# Patient Record
Sex: Male | Born: 1940 | Race: White | Hispanic: No | Marital: Married | Smoking: Former smoker
Health system: Southern US, Community
[De-identification: ages and names within clinical notes are randomized; demographics above are authoritative.]

## PROBLEM LIST (undated history)

## (undated) DIAGNOSIS — I2510 Atherosclerotic heart disease of native coronary artery without angina pectoris: Secondary | ICD-10-CM

## (undated) DIAGNOSIS — K922 Gastrointestinal hemorrhage, unspecified: Secondary | ICD-10-CM

## (undated) DIAGNOSIS — E785 Hyperlipidemia, unspecified: Secondary | ICD-10-CM

## (undated) DIAGNOSIS — K5790 Diverticulosis of intestine, part unspecified, without perforation or abscess without bleeding: Secondary | ICD-10-CM

## (undated) DIAGNOSIS — I10 Essential (primary) hypertension: Secondary | ICD-10-CM

## (undated) DIAGNOSIS — D126 Benign neoplasm of colon, unspecified: Secondary | ICD-10-CM

## (undated) DIAGNOSIS — I480 Paroxysmal atrial fibrillation: Secondary | ICD-10-CM

## (undated) DIAGNOSIS — E119 Type 2 diabetes mellitus without complications: Secondary | ICD-10-CM

## (undated) DIAGNOSIS — Z950 Presence of cardiac pacemaker: Secondary | ICD-10-CM

## (undated) DIAGNOSIS — I5032 Chronic diastolic (congestive) heart failure: Secondary | ICD-10-CM

## (undated) DIAGNOSIS — K219 Gastro-esophageal reflux disease without esophagitis: Secondary | ICD-10-CM

## (undated) DIAGNOSIS — N183 Chronic kidney disease, stage 3 unspecified: Secondary | ICD-10-CM

## (undated) DIAGNOSIS — I639 Cerebral infarction, unspecified: Secondary | ICD-10-CM

## (undated) DIAGNOSIS — K2970 Gastritis, unspecified, without bleeding: Secondary | ICD-10-CM

## (undated) DIAGNOSIS — I6529 Occlusion and stenosis of unspecified carotid artery: Secondary | ICD-10-CM

## (undated) DIAGNOSIS — G709 Myoneural disorder, unspecified: Secondary | ICD-10-CM

## (undated) DIAGNOSIS — D649 Anemia, unspecified: Secondary | ICD-10-CM

## (undated) DIAGNOSIS — I739 Peripheral vascular disease, unspecified: Secondary | ICD-10-CM

## (undated) DIAGNOSIS — E1140 Type 2 diabetes mellitus with diabetic neuropathy, unspecified: Secondary | ICD-10-CM

## (undated) DIAGNOSIS — R0602 Shortness of breath: Secondary | ICD-10-CM

## (undated) HISTORY — DX: Hyperlipidemia, unspecified: E78.5

## (undated) HISTORY — DX: Occlusion and stenosis of unspecified carotid artery: I65.29

## (undated) HISTORY — DX: Gastrointestinal hemorrhage, unspecified: K92.2

## (undated) HISTORY — PX: KNEE ARTHROSCOPY: SHX127

## (undated) HISTORY — DX: Peripheral vascular disease, unspecified: I73.9

## (undated) HISTORY — DX: Chronic kidney disease, stage 3 unspecified: N18.30

## (undated) HISTORY — PX: JOINT REPLACEMENT: SHX530

## (undated) HISTORY — DX: Chronic diastolic (congestive) heart failure: I50.32

## (undated) HISTORY — DX: Essential (primary) hypertension: I10

## (undated) HISTORY — DX: Chronic kidney disease, stage 3 (moderate): N18.3

## (undated) HISTORY — DX: Atherosclerotic heart disease of native coronary artery without angina pectoris: I25.10

## (undated) HISTORY — PX: CATARACT EXTRACTION: SUR2

## (undated) HISTORY — PX: TONSILLECTOMY AND ADENOIDECTOMY: SUR1326

## (undated) HISTORY — DX: Paroxysmal atrial fibrillation: I48.0

## (undated) HISTORY — DX: Diverticulosis of intestine, part unspecified, without perforation or abscess without bleeding: K57.90

## (undated) HISTORY — PX: CHOLECYSTECTOMY: SHX55

## (undated) HISTORY — PX: CIRCUMCISION: SUR203

## (undated) HISTORY — DX: Type 2 diabetes mellitus without complications: E11.9

## (undated) HISTORY — DX: Benign neoplasm of colon, unspecified: D12.6

## (undated) HISTORY — PX: TOTAL KNEE ARTHROPLASTY: SHX125

## (undated) SURGERY — EGD (ESOPHAGOGASTRODUODENOSCOPY)
Anesthesia: Moderate Sedation

## (undated) SURGERY — ESOPHAGOGASTRODUODENOSCOPY (EGD) WITH PROPOFOL
Anesthesia: Monitor Anesthesia Care

---

## 1998-09-29 ENCOUNTER — Inpatient Hospital Stay (HOSPITAL_COMMUNITY): Admission: EM | Admit: 1998-09-29 | Discharge: 1998-10-03 | Payer: Self-pay | Admitting: *Deleted

## 1998-09-29 ENCOUNTER — Encounter: Payer: Self-pay | Admitting: Cardiology

## 2000-06-17 ENCOUNTER — Ambulatory Visit (HOSPITAL_COMMUNITY): Admission: RE | Admit: 2000-06-17 | Discharge: 2000-06-17 | Payer: Self-pay | Admitting: *Deleted

## 2001-03-11 ENCOUNTER — Ambulatory Visit (HOSPITAL_COMMUNITY): Admission: RE | Admit: 2001-03-11 | Discharge: 2001-03-11 | Payer: Self-pay | Admitting: Neurology

## 2002-01-15 DIAGNOSIS — I10 Essential (primary) hypertension: Secondary | ICD-10-CM | POA: Insufficient documentation

## 2002-07-04 ENCOUNTER — Inpatient Hospital Stay (HOSPITAL_COMMUNITY): Admission: EM | Admit: 2002-07-04 | Discharge: 2002-07-05 | Payer: Self-pay | Admitting: Emergency Medicine

## 2002-07-04 ENCOUNTER — Encounter: Payer: Self-pay | Admitting: Cardiology

## 2004-01-31 ENCOUNTER — Ambulatory Visit: Payer: Self-pay | Admitting: Endocrinology

## 2004-02-01 ENCOUNTER — Ambulatory Visit: Payer: Self-pay | Admitting: Endocrinology

## 2005-10-15 ENCOUNTER — Inpatient Hospital Stay (HOSPITAL_COMMUNITY): Admission: RE | Admit: 2005-10-15 | Discharge: 2005-10-18 | Payer: Self-pay | Admitting: Orthopedic Surgery

## 2006-05-15 ENCOUNTER — Ambulatory Visit: Payer: Self-pay | Admitting: Vascular Surgery

## 2007-02-25 ENCOUNTER — Ambulatory Visit: Payer: Self-pay | Admitting: Vascular Surgery

## 2007-08-26 ENCOUNTER — Ambulatory Visit: Payer: Self-pay | Admitting: Vascular Surgery

## 2008-03-02 ENCOUNTER — Ambulatory Visit: Payer: Self-pay | Admitting: Vascular Surgery

## 2008-07-06 ENCOUNTER — Ambulatory Visit: Payer: Self-pay | Admitting: Vascular Surgery

## 2008-08-09 ENCOUNTER — Ambulatory Visit: Payer: Self-pay | Admitting: Vascular Surgery

## 2008-08-09 ENCOUNTER — Ambulatory Visit (HOSPITAL_COMMUNITY): Admission: RE | Admit: 2008-08-09 | Discharge: 2008-08-09 | Payer: Self-pay | Admitting: Vascular Surgery

## 2008-08-31 ENCOUNTER — Ambulatory Visit: Payer: Self-pay | Admitting: Vascular Surgery

## 2009-02-07 ENCOUNTER — Inpatient Hospital Stay (HOSPITAL_COMMUNITY): Admission: EM | Admit: 2009-02-07 | Discharge: 2009-02-09 | Payer: Self-pay | Admitting: Emergency Medicine

## 2009-02-24 ENCOUNTER — Ambulatory Visit: Payer: Self-pay | Admitting: Vascular Surgery

## 2009-03-15 HISTORY — PX: CAROTID ENDARTERECTOMY: SUR193

## 2009-03-22 ENCOUNTER — Inpatient Hospital Stay (HOSPITAL_COMMUNITY): Admission: RE | Admit: 2009-03-22 | Discharge: 2009-03-23 | Payer: Self-pay | Admitting: Vascular Surgery

## 2009-03-22 ENCOUNTER — Ambulatory Visit: Payer: Self-pay | Admitting: Vascular Surgery

## 2009-03-22 ENCOUNTER — Encounter: Payer: Self-pay | Admitting: Vascular Surgery

## 2009-03-30 ENCOUNTER — Inpatient Hospital Stay (HOSPITAL_COMMUNITY): Admission: EM | Admit: 2009-03-30 | Discharge: 2009-03-31 | Payer: Self-pay | Admitting: Emergency Medicine

## 2009-03-30 ENCOUNTER — Encounter: Admission: RE | Admit: 2009-03-30 | Discharge: 2009-03-30 | Payer: Self-pay | Admitting: Internal Medicine

## 2009-03-30 ENCOUNTER — Encounter (INDEPENDENT_AMBULATORY_CARE_PROVIDER_SITE_OTHER): Payer: Self-pay | Admitting: Surgery

## 2009-04-07 ENCOUNTER — Ambulatory Visit: Payer: Self-pay | Admitting: Vascular Surgery

## 2009-11-30 ENCOUNTER — Ambulatory Visit: Payer: Self-pay | Admitting: Vascular Surgery

## 2009-11-30 ENCOUNTER — Ambulatory Visit: Payer: Self-pay | Admitting: Cardiovascular Disease

## 2010-01-15 DIAGNOSIS — I779 Disorder of arteries and arterioles, unspecified: Secondary | ICD-10-CM | POA: Insufficient documentation

## 2010-01-15 DIAGNOSIS — D509 Iron deficiency anemia, unspecified: Secondary | ICD-10-CM | POA: Insufficient documentation

## 2010-01-15 DIAGNOSIS — K649 Unspecified hemorrhoids: Secondary | ICD-10-CM | POA: Insufficient documentation

## 2010-03-29 ENCOUNTER — Ambulatory Visit (INDEPENDENT_AMBULATORY_CARE_PROVIDER_SITE_OTHER): Payer: Federal, State, Local not specified - PPO | Admitting: Nurse Practitioner

## 2010-03-29 DIAGNOSIS — E119 Type 2 diabetes mellitus without complications: Secondary | ICD-10-CM

## 2010-03-29 DIAGNOSIS — I4891 Unspecified atrial fibrillation: Secondary | ICD-10-CM

## 2010-03-29 DIAGNOSIS — I739 Peripheral vascular disease, unspecified: Secondary | ICD-10-CM

## 2010-03-30 ENCOUNTER — Encounter: Payer: Self-pay | Admitting: Cardiovascular Disease

## 2010-03-30 DIAGNOSIS — I6529 Occlusion and stenosis of unspecified carotid artery: Secondary | ICD-10-CM | POA: Insufficient documentation

## 2010-03-31 ENCOUNTER — Encounter (INDEPENDENT_AMBULATORY_CARE_PROVIDER_SITE_OTHER): Payer: Federal, State, Local not specified - PPO

## 2010-03-31 ENCOUNTER — Encounter: Payer: Self-pay | Admitting: Cardiovascular Disease

## 2010-03-31 DIAGNOSIS — M79609 Pain in unspecified limb: Secondary | ICD-10-CM | POA: Insufficient documentation

## 2010-03-31 DIAGNOSIS — I70219 Atherosclerosis of native arteries of extremities with intermittent claudication, unspecified extremity: Secondary | ICD-10-CM

## 2010-03-31 DIAGNOSIS — I739 Peripheral vascular disease, unspecified: Secondary | ICD-10-CM

## 2010-04-02 LAB — BASIC METABOLIC PANEL
CO2: 27 mEq/L (ref 19–32)
Chloride: 102 mEq/L (ref 96–112)
GFR calc non Af Amer: >60 mL/min (ref >60)
Glucose, Bld: 193 mg/dL — ABNORMAL HIGH (ref 70–99)
Potassium: 3.8 mEq/L (ref 3.5–5.1)
Sodium: 136 mEq/L (ref 135–145)

## 2010-04-02 LAB — DIFFERENTIAL
Basophils Absolute: 0.1 10*3/uL (ref 0.0–0.1)
Basophils Relative: 1 % (ref 0–1)
Eosinophils Absolute: 0.3 10*3/uL (ref 0.0–0.7)
Neutrophils Relative %: 66 % (ref 43–77)

## 2010-04-02 LAB — CBC
HCT: 27.9 % — ABNORMAL LOW (ref 39.0–52.0)
HCT: 28.3 % — ABNORMAL LOW (ref 39.0–52.0)
HCT: 33.2 % — ABNORMAL LOW (ref 39.0–52.0)
Hemoglobin: 10.8 g/dL — ABNORMAL LOW (ref 13.0–17.0)
Hemoglobin: 11.5 g/dL — ABNORMAL LOW (ref 13.0–17.0)
Hemoglobin: 9.6 g/dL — ABNORMAL LOW (ref 13.0–17.0)
Hemoglobin: 9.7 g/dL — ABNORMAL LOW (ref 13.0–17.0)
MCHC: 34.8 g/dL (ref 30.0–36.0)
Platelets: 217 10*3/uL (ref 150–400)
RBC: 4.28 MIL/uL (ref 4.22–5.81)
RDW: 15 % (ref 11.5–15.5)
RDW: 15.6 % — ABNORMAL HIGH (ref 11.5–15.5)
RDW: 15.8 % — ABNORMAL HIGH (ref 11.5–15.5)

## 2010-04-02 LAB — GLUCOSE, CAPILLARY
Glucose-Capillary: 162 mg/dL — ABNORMAL HIGH (ref 70–99)
Glucose-Capillary: 204 mg/dL — ABNORMAL HIGH (ref 70–99)
Glucose-Capillary: 217 mg/dL — ABNORMAL HIGH (ref 70–99)

## 2010-04-02 LAB — COMPREHENSIVE METABOLIC PANEL
ALT: 34 U/L (ref 0–53)
Alkaline Phosphatase: 44 U/L (ref 39–117)
BUN: 18 mg/dL (ref 6–23)
CO2: 24 mEq/L (ref 19–32)
GFR calc non Af Amer: >60 mL/min (ref >60)
Glucose, Bld: 147 mg/dL — ABNORMAL HIGH (ref 70–99)
Potassium: 4 mEq/L (ref 3.5–5.1)
Sodium: 136 mEq/L (ref 135–145)
Total Bilirubin: 0.6 mg/dL (ref 0.3–1.2)

## 2010-04-02 LAB — TYPE AND SCREEN
ABO/RH(D): O NEG
Antibody Screen: NEGATIVE

## 2010-04-04 NOTE — Miscellaneous (Signed)
Summary: Orders Update  Clinical Lists Changes  Problems: Added new problem of PAIN IN SOFT TISSUES OF LIMB (ICD-729.5) Orders: Added new Test order of Arterial Duplex Lower Extremity (Arterial Duplex Low) - Signed

## 2010-04-04 NOTE — Miscellaneous (Signed)
Summary: Orders Update  Clinical Lists Changes  Problems: Added new problem of CAROTID ARTERY DISEASE (ICD-433.10) Orders: Added new Test order of Carotid Duplex (Carotid Duplex) - Signed

## 2010-04-09 LAB — CBC
HCT: 29.8 % — ABNORMAL LOW (ref 39.0–52.0)
HCT: 31.7 % — ABNORMAL LOW (ref 39.0–52.0)
Hemoglobin: 10.1 g/dL — ABNORMAL LOW (ref 13.0–17.0)
MCHC: 33.9 g/dL (ref 30.0–36.0)
MCV: 76.6 fL — ABNORMAL LOW (ref 78.0–100.0)
Platelets: 238 10*3/uL (ref 150–400)
Platelets: 278 10*3/uL (ref 150–400)
RBC: 3.82 MIL/uL — ABNORMAL LOW (ref 4.22–5.81)
RBC: 4.46 MIL/uL (ref 4.22–5.81)
RBC: 4.68 MIL/uL (ref 4.22–5.81)
WBC: 8.8 10*3/uL (ref 4.0–10.5)
WBC: 15.3 10*3/uL — ABNORMAL HIGH (ref 4.0–10.5)
WBC: 21.7 10*3/uL — ABNORMAL HIGH (ref 4.0–10.5)

## 2010-04-09 LAB — COMPREHENSIVE METABOLIC PANEL
ALT: 28 U/L (ref 0–53)
ALT: 15 U/L (ref 0–53)
AST: 30 U/L (ref 0–37)
AST: 15 U/L (ref 0–37)
Albumin: 2.7 g/dL — ABNORMAL LOW (ref 3.5–5.2)
Albumin: 3.3 g/dL — ABNORMAL LOW (ref 3.5–5.2)
Alkaline Phosphatase: 59 U/L (ref 39–117)
BUN: 15 mg/dL (ref 6–23)
CO2: 26 mEq/L (ref 19–32)
CO2: 27 mEq/L (ref 19–32)
CO2: 29 mEq/L (ref 19–32)
Calcium: 9.3 mg/dL (ref 8.4–10.5)
Chloride: 101 mEq/L (ref 96–112)
Chloride: 92 mEq/L — ABNORMAL LOW (ref 96–112)
Chloride: 95 mEq/L — ABNORMAL LOW (ref 96–112)
Creatinine, Ser: 1.37 mg/dL (ref 0.4–1.5)
Creatinine, Ser: 1.38 mg/dL (ref 0.4–1.5)
GFR calc Af Amer: >60 mL/min (ref >60)
GFR calc Af Amer: >60 mL/min (ref >60)
GFR calc non Af Amer: >60 mL/min (ref >60)
GFR calc non Af Amer: 52 mL/min — ABNORMAL LOW (ref >60)
Potassium: 3.5 mEq/L (ref 3.5–5.1)
Sodium: 136 mEq/L (ref 135–145)
Sodium: 131 mEq/L — ABNORMAL LOW (ref 135–145)
Total Bilirubin: 0.6 mg/dL (ref 0.3–1.2)
Total Bilirubin: 0.7 mg/dL (ref 0.3–1.2)
Total Bilirubin: 0.9 mg/dL (ref 0.3–1.2)

## 2010-04-09 LAB — TYPE AND SCREEN: Antibody Screen: NEGATIVE

## 2010-04-09 LAB — BLOOD GAS, ARTERIAL
Drawn by: 206361
FIO2: 0.21 %
pCO2 arterial: 38.2 mmHg (ref 35.0–45.0)
pH, Arterial: 7.437 (ref 7.350–7.450)

## 2010-04-09 LAB — GLUCOSE, CAPILLARY
Glucose-Capillary: 164 mg/dL — ABNORMAL HIGH (ref 70–99)
Glucose-Capillary: 175 mg/dL — ABNORMAL HIGH (ref 70–99)
Glucose-Capillary: 188 mg/dL — ABNORMAL HIGH (ref 70–99)
Glucose-Capillary: 250 mg/dL — ABNORMAL HIGH (ref 70–99)

## 2010-04-09 LAB — DIFFERENTIAL
Basophils Absolute: 0 10*3/uL (ref 0.0–0.1)
Eosinophils Absolute: 0.9 10*3/uL — ABNORMAL HIGH (ref 0.0–0.7)
Eosinophils Relative: 4 % (ref 0–5)
Lymphocytes Relative: 8 % — ABNORMAL LOW (ref 12–46)
Monocytes Absolute: 2.6 10*3/uL — ABNORMAL HIGH (ref 0.1–1.0)

## 2010-04-09 LAB — BASIC METABOLIC PANEL
CO2: 27 mEq/L (ref 19–32)
Calcium: 7.9 mg/dL — ABNORMAL LOW (ref 8.4–10.5)
GFR calc Af Amer: 60 mL/min — ABNORMAL LOW (ref >60)
GFR calc non Af Amer: 49 mL/min — ABNORMAL LOW (ref >60)
Glucose, Bld: 138 mg/dL — ABNORMAL HIGH (ref 70–99)
Potassium: 3.8 mEq/L (ref 3.5–5.1)
Sodium: 135 mEq/L (ref 135–145)

## 2010-04-09 LAB — URINE MICROSCOPIC-ADD ON

## 2010-04-09 LAB — URINALYSIS, ROUTINE W REFLEX MICROSCOPIC
Glucose, UA: NEGATIVE mg/dL
Ketones, ur: NEGATIVE mg/dL
Leukocytes, UA: NEGATIVE
Specific Gravity, Urine: 1.022 (ref 1.005–1.030)
pH: 5.5 (ref 5.0–8.0)

## 2010-04-09 LAB — HEMOGLOBIN A1C: Mean Plasma Glucose: 137 mg/dL

## 2010-04-13 ENCOUNTER — Encounter: Payer: Federal, State, Local not specified - PPO | Admitting: Cardiovascular Disease

## 2010-04-17 ENCOUNTER — Encounter: Payer: Self-pay | Admitting: Cardiovascular Disease

## 2010-04-18 ENCOUNTER — Encounter: Payer: Self-pay | Admitting: Cardiovascular Disease

## 2010-04-18 ENCOUNTER — Ambulatory Visit (INDEPENDENT_AMBULATORY_CARE_PROVIDER_SITE_OTHER): Payer: Federal, State, Local not specified - PPO | Admitting: Cardiovascular Disease

## 2010-04-18 ENCOUNTER — Telehealth: Payer: Self-pay | Admitting: Cardiovascular Disease

## 2010-04-18 VITALS — BP 134/90 | HR 58 | Wt 225.0 lb

## 2010-04-18 DIAGNOSIS — R5383 Other fatigue: Secondary | ICD-10-CM

## 2010-04-18 DIAGNOSIS — R0609 Other forms of dyspnea: Secondary | ICD-10-CM | POA: Insufficient documentation

## 2010-04-18 DIAGNOSIS — I4891 Unspecified atrial fibrillation: Secondary | ICD-10-CM

## 2010-04-18 DIAGNOSIS — D649 Anemia, unspecified: Secondary | ICD-10-CM | POA: Insufficient documentation

## 2010-04-18 DIAGNOSIS — I70219 Atherosclerosis of native arteries of extremities with intermittent claudication, unspecified extremity: Secondary | ICD-10-CM | POA: Insufficient documentation

## 2010-04-18 DIAGNOSIS — I739 Peripheral vascular disease, unspecified: Secondary | ICD-10-CM

## 2010-04-18 DIAGNOSIS — R5381 Other malaise: Secondary | ICD-10-CM

## 2010-04-18 DIAGNOSIS — I480 Paroxysmal atrial fibrillation: Secondary | ICD-10-CM | POA: Insufficient documentation

## 2010-04-18 DIAGNOSIS — E119 Type 2 diabetes mellitus without complications: Secondary | ICD-10-CM | POA: Insufficient documentation

## 2010-04-18 NOTE — Progress Notes (Signed)
History of Present Illness:  Fatigue, Dyspnea with exertion Climbing stairs.   No chest pain.   Edward [name] presents with many vague problems. He complains of generalized fatigue. This is been going on for the past several months. He complains of severe dyspnea with exertion. In particular he has severe dyspnea climbing stairs. He denies any angina or chest pain.  He was seen several weeks ago by our nurse practitioner. He was found be in atrial fibrillation. He has a history of atrial fibrillation and has been on the Xarelto since that time.  He was started on flecainide 100 mg twice a day at that time. He said several negative stress tests in the past.  He also complains of claudication. 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 By description it sounds like it's in the popliteal region. I do not have access to that report at this time. He's been seen by Dr. Gae Gallop in the past and was found not had any significant occlusive disease as of several years ago.   Current Outpatient Prescriptions on File Prior to Visit  Medication Sig Dispense Refill  . amLODipine (NORVASC) 5 MG tablet Take 5 mg by mouth daily.       . Calcium Carbonate-Vitamin D (CALCIUM + D PO) Take 1,200 mg by mouth daily.        . carvedilol (COREG) 25 MG tablet Take 25 mg by mouth 2 (two) times daily.       . digoxin (LANOXIN) 0.25 MG tablet Take 250 mcg by mouth daily.       Marland Kitchen diltiazem (TIAZAC) 240 MG 24 hr capsule Take 240 mg by mouth daily.       . fish oil-omega-3 fatty acids 1000 MG capsule Take 1 g by mouth 2 (two) times daily.        . flecainide (TAMBOCOR) 100 MG tablet Take 100 mg by mouth 2 (two) times daily.       . hydrochlorothiazide 25 MG tablet Take 25 mg by mouth daily.       Marland Kitchen L-Methylfolate-B6-B12 (METANX PO) Take by mouth daily.        Marland Kitchen loratadine-pseudoephedrine (CLARITIN-D 24-HOUR) 10-240 MG per 24 hr tablet Take 1 tablet by mouth daily.        Marland Kitchen losartan  (COZAAR) 100 MG tablet Take 100 mg by mouth daily.        . metFORMIN (GLUCOPHAGE) 1000 MG tablet Take 1,000 mg by mouth 2 (two) times daily.        . Multiple Vitamin (MULTIVITAMIN) tablet Take 1 tablet by mouth daily.        Marland Kitchen omeprazole (PRILOSEC OTC) 20 MG tablet Take 20 mg by mouth daily.        . Potassium 99 MG TABS Take 595 mg by mouth daily.       . pravastatin (PRAVACHOL) 80 MG tablet Take 80 mg by mouth daily.        . Rivaroxaban (XARELTO) 20 MG TABS Take by mouth daily.       . simvastatin (ZOCOR) 40 MG tablet Take 40 mg by mouth at bedtime.         Not on File  Past Medical History  Diagnosis Date  . A-fib   . HTN (hypertension)   . Diabetes mellitus   . Dyslipidemia   . PVD (peripheral vascular disease)     Past Surgical History  Procedure Date  . Carotid endarterectomy  RIGHT  . Cholecystectomy   . Tonsillectomy and adenoidectomy   . Knee surgery     History  Smoking status  . Never Smoker   Smokeless tobacco  . Not on file    History  Alcohol Use  . Yes    OCC.    Family History  Problem Relation Age of Onset  . Coronary artery disease Father   . Hypertension Father   . Heart attack Mother   . Coronary artery disease Mother   . Hypertension Sister     Reviw of Systems:   Positive for generalized fatigue, dyspnea with exertion. He denies any chest pain. He denies any bleeding in his stool.He denies any syncope. He's sleeping fairly well.   Physical Exam: BP 134/90  Pulse 58  Wt 225 lb (102.059 kg) The patient is alert and oriented x 3.  The mood and affect are normal.  The skin is warm and dry.  Color is normal.  The HEENT exam reveals that the sclera are nonicteric.  The mucous membranes are moist.  The carotids are 2+ without bruits.  There is no thyromegaly.  There is no JVD.  The lungs are clear.  The chest wall is non tender.  The heart exam reveals an irregular rate with a normal S1 and S2.  There are no murmurs, gallops, or rubs.   The PMI is not displaced.   Abdominal exam reveals good bowel sounds.  There is no guarding or rebound.  There is no hepatosplenomegaly or tenderness.  There are no masses.  Exam of the legs reveal no clubbing, cyanosis, or edema.  The legs are without rashes.  The distal pulses are diminished.  Cranial nerves II - XII are intact.  Motor and sensory functions are intact.  The gait is normal.  ECG: Atrial fibrillation with a slow ventricular response. Assessment / Plan:

## 2010-04-18 NOTE — Assessment & Plan Note (Signed)
He has a history of anemia in the past. We'll be checking a CBC tomorrow morning. This will be followed up by Dr. Elder Cyphers.

## 2010-04-18 NOTE — Assessment & Plan Note (Signed)
I do not have a good explanation for his fatigue. He's been anemic in the past. We will have his medical doctor work this up further.  His TSH was found to be mildly elevated during his last visit and he was started on Synthroid 0.25 mg a day. I do not think that this been Minimal hypothyroidism is responsible for his fatigue.He'll be seeing an endocrinologist for further evaluation.

## 2010-04-18 NOTE — Patient Instructions (Signed)
Short Stay Section C , 7:30 AM, April 19, 2010-  for Cardioversion ,

## 2010-04-18 NOTE — Telephone Encounter (Signed)
Wife informed he is to be at short stay by 7 am he needs lab work prior and to also hold diltiazem. Wife wrote additional instructions and verbalized understanding. Corwin Levins RN

## 2010-04-18 NOTE — Assessment & Plan Note (Signed)
He has symptoms of claudication. This is also complicated by his extreme fatigue and dyspnea with exertion. He will be seeing Dr. Burt Knack in several weeks for further evaluation of his claudication and his peripheral vascular disease.

## 2010-04-18 NOTE — Assessment & Plan Note (Signed)
Edward [name] presents with numerous problems including atrial fibrillation with a slow ventricular response. It is possible that some of his fatigue is due to the fibrillation although I worry that he may have more going on than just atrial fibrillation. He's been on Xarelto. We'll schedule him For cardioversion tomorrow. I've asked him to hold his digoxin. We'll also has had him hold his diltiazem.  We will anticipate getting an echocardiogram and a repeat LexiScan IV study in the next Month or so. I'll see him again in one month.

## 2010-04-19 ENCOUNTER — Ambulatory Visit (HOSPITAL_COMMUNITY)
Admission: RE | Admit: 2010-04-19 | Discharge: 2010-04-19 | Disposition: A | Discharge status: Home / Self Care | Payer: Federal, State, Local not specified - PPO | Source: Ambulatory Visit | Attending: Cardiovascular Disease | Admitting: Cardiovascular Disease

## 2010-04-19 ENCOUNTER — Other Ambulatory Visit: Payer: Self-pay | Admitting: Cardiovascular Disease

## 2010-04-19 DIAGNOSIS — I4891 Unspecified atrial fibrillation: Secondary | ICD-10-CM

## 2010-04-19 DIAGNOSIS — R5381 Other malaise: Secondary | ICD-10-CM | POA: Insufficient documentation

## 2010-04-19 LAB — CBC
Hemoglobin: 12 g/dL — ABNORMAL LOW (ref 13.0–17.0)
Platelets: 205 10*3/uL (ref 150–400)

## 2010-04-19 LAB — GLUCOSE, CAPILLARY: Glucose-Capillary: 183 mg/dL — ABNORMAL HIGH (ref 70–99)

## 2010-04-19 LAB — BASIC METABOLIC PANEL
BUN: 14 mg/dL (ref 6–23)
Chloride: 104 mEq/L (ref 96–112)
Glucose, Bld: 169 mg/dL — ABNORMAL HIGH (ref 70–99)
Potassium: 4.1 mEq/L (ref 3.5–5.1)
Sodium: 139 mEq/L (ref 135–145)

## 2010-04-19 NOTE — Assessment & Plan Note (Signed)
Successful cardioversion. DC Digoxin, DC diltiazem. Continue Flecainide, Xarelto.

## 2010-04-19 NOTE — Progress Notes (Signed)
Pt had a successful cardioversion this AM.  Received IV propofol 130 mg IV.  Sync. Cardioverted X 1 using 100 J. Sinus rhythm with 1st degree AV block.  Will:   DC Digoxin DC diltiazem.  He is to continue flecainide 100 mg BID, carvedilol 25 mg po BID, Xarelto. Office visit in several weeks.

## 2010-04-20 ENCOUNTER — Telehealth: Payer: Self-pay | Admitting: Cardiovascular Disease

## 2010-04-20 NOTE — Telephone Encounter (Signed)
PT HAD CARDIO VERSION YESTERDAY, TROUBLE BREATHING LAST NIGHT WITH LOTS OF SOB. PT WENT TO WORK THIS MORNING BUT WIFE VERY WORRIED. PLACED CHART IN BOX.

## 2010-04-20 NOTE — Telephone Encounter (Signed)
Spoke with wife,husband went to work today. She voiced concern with his C/O SOB and mid sternum pressure. I offered an app. with another provider or to go to ER if need. Wife declined app at this time but understood to go to ER if any worsening symptoms. Pt was cardioverted yesterday but pt didn't feel he was back in afib, pt able to check own pulse. Wife verbalized understanding of instructions. Corwin Levins Rn

## 2010-04-21 ENCOUNTER — Other Ambulatory Visit: Payer: Self-pay | Admitting: Cardiovascular Disease

## 2010-04-23 LAB — POCT I-STAT, CHEM 8
BUN: 16 mg/dL (ref 6–23)
Chloride: 98 mEq/L (ref 96–112)
Creatinine, Ser: 1 mg/dL (ref 0.4–1.5)
Sodium: 137 mEq/L (ref 135–145)
TCO2: 26 mmol/L (ref 0–100)

## 2010-04-25 NOTE — Procedures (Signed)
  NAMEKELEN, LAURA NO.:  [id number]  MEDICAL RECORD NO.:  [id number]           PATIENT TYPE:  O  LOCATION:  [facility]                         FACILITY:  [facility]  PHYSICIAN:  [name], M.D. DATE OF BIRTH:  March 13, 1940  DATE OF PROCEDURE:  04/19/2010 DATE OF DISCHARGE:                           CARDIAC CATHETERIZATION   Edward [name] is a 70 year old gentleman with a history of intermittent atrial fibrillation.  He has had atrial fibrillation in the past that has converted back to normal spontaneously.  He was found he has had a month or so of progressive fatigue.  He was seen by our nurse practitioner several weeks ago and was found to have atrial fibrillation with a rapid rate.  He was started on flecainide.  He had, had several previous stress test that revealed no evidence of ischemia.  The patient has been on Xarelto for several months.  He was scheduled for cardioversion.  The patient was brought to the [facility] in a fasting state.  An IV was started.  He was given propofol 130 mg IV. Synchronous biphasic cardioversion was performed at 100 joules after the patient achieved adequate sedation.  He converted to sinus rhythm after the first cardioversion.  He has a first-degree AV block.  The patient is waking up and is in satisfactory condition.  COMPLICATIONS:  None.  CONCLUSION:  Atrial fibrillation - status post successful cardioversion.  The patient will be discharged home within an hour.  He will continue on the low-dose flecainide 100 mg twice a day.     [name], M.D.     PJN/MEDQ  D:  04/19/2010  T:  04/19/2010  Job:  [id number]  cc:   [name], M.D.  Electronically Signed by Mertie Moores M.D. on 04/25/2010 09:10:05 AM

## 2010-04-27 ENCOUNTER — Encounter: Payer: Self-pay | Admitting: Cardiovascular Disease

## 2010-04-27 ENCOUNTER — Ambulatory Visit (INDEPENDENT_AMBULATORY_CARE_PROVIDER_SITE_OTHER): Payer: Federal, State, Local not specified - PPO | Admitting: Cardiovascular Disease

## 2010-04-27 ENCOUNTER — Institutional Professional Consult (permissible substitution): Payer: Federal, State, Local not specified - PPO | Admitting: Cardiovascular Disease

## 2010-04-27 VITALS — BP 112/63 | HR 70 | Resp 18 | Ht 68.0 in | Wt 225.0 lb

## 2010-04-27 DIAGNOSIS — I70219 Atherosclerosis of native arteries of extremities with intermittent claudication, unspecified extremity: Secondary | ICD-10-CM

## 2010-04-27 DIAGNOSIS — I739 Peripheral vascular disease, unspecified: Secondary | ICD-10-CM

## 2010-04-27 LAB — BASIC METABOLIC PANEL
CO2: 26 mEq/L (ref 19–32)
Calcium: 8.6 mg/dL (ref 8.4–10.5)
Chloride: 99 mEq/L (ref 96–112)
Glucose, Bld: 132 mg/dL — ABNORMAL HIGH (ref 70–99)
Sodium: 136 mEq/L (ref 135–145)

## 2010-04-27 NOTE — Assessment & Plan Note (Signed)
The patient has a suggestive history of intermittent claudication with significant lifestyle limitation. However, he underwent an angiogram 2 years ago that did not demonstrate occlusive arterial disease. His resting ankle-brachial indices are now abnormal and he may have developed progressive disease since his last evaluation. I have recommended a CT angiogram to evaluate his aortoiliac vessels and this will also confirm whether he has significant infrainguinal disease. If we do find significant proximal disease, I will discuss with Dr. Doren Custard who has followed him regularly and has an upcoming appointment in a few weeks.

## 2010-04-27 NOTE — Patient Instructions (Addendum)
Non-Cardiac CT Angiography (CTA), is a special type of CT scan that uses a computer to produce multi-dimensional views of major blood vessels throughout the body. In CT angiography, a contrast material is injected through an IV to help visualize the blood vessels.  DO NOT TAKE METFORMIN THE NIGHT BEFORE, MORNING OF, OR 48 HOURS AFTER TEST.  Lab today---BMP 440.21  You do not need to schedule a follow-up with Dr Burt Knack at this time.  Our office will call you with results.

## 2010-04-27 NOTE — Progress Notes (Signed)
HPI:  Is a 70 year old gentleman referred for evaluation of lower extremity peripheral arterial disease. The patient reports an approximate two-year history of exertional leg pain. He describes progressive bilateral leg pain with exertion. He is limited to about one city block of walking before he has to stop. The pain resolves within 3-5 minutes of rest. The patient states both legs are equally affected. He denies rest pain or any history of ulceration. The patient feels like both legs "tighten up" with walking.  An arterial duplex scan was performed March 31, 2010. This showed ankle-brachial indices of 89% on the right at 76% on the left. There were elevated velocities in the mid right superficial femoral artery suggestive of greater than 50% stenosis. There were severely elevated velocities in the left proximal popliteal artery with a peak velocity greater than 500 cm/s. There is also suggestion of bilateral inflow disease with monophasic common femoral waveforms bilaterally.  In reviewing the patient's records, he underwent an abdominal aortic angiogram and bilateral lower extremity runoff in 2010 by Dr. Scot Dock. This demonstrated mild distal aortic stenosis and mild iliac stenosis but no significant occlusive disease was noted. Medical therapy was recommended.  Outpatient Encounter Prescriptions as of 04/27/2010  Medication Sig Dispense Refill  . amLODipine (NORVASC) 5 MG tablet Take 5 mg by mouth daily.       . Calcium Carbonate-Vitamin D (CALCIUM + D PO) Take 1,200 mg by mouth daily.        . carvedilol (COREG) 25 MG tablet Take 25 mg by mouth 2 (two) times daily.       . Coenzyme Q10 (COQ10) 200 MG CAPS Take 1 capsule by mouth daily.        Marland Kitchen exenatide (BYETTA 10 MCG PEN) 10 MCG/0.04ML SOLN Inject 0.1 mLs (25 mcg total) into the skin 2 (two) times daily with a meal.  1.2 mL    . fish oil-omega-3 fatty acids 1000 MG capsule Take 1 g by mouth 2 (two) times daily.        . flecainide (TAMBOCOR) 100  MG tablet Take 100 mg by mouth 2 (two) times daily.       . hydrochlorothiazide 25 MG tablet Take 25 mg by mouth daily.       Marland Kitchen L-Methylfolate-B6-B12 (METANX PO) Take by mouth daily.        Marland Kitchen loratadine-pseudoephedrine (CLARITIN-D 24-HOUR) 10-240 MG per 24 hr tablet Take 1 tablet by mouth daily.        Marland Kitchen losartan (COZAAR) 100 MG tablet Take 100 mg by mouth daily.        . Magnesium 250 MG TABS Take 1 tablet by mouth daily.        . metFORMIN (GLUCOPHAGE) 1000 MG tablet Take 1,000 mg by mouth 2 (two) times daily.        . Multiple Vitamin (MULTIVITAMIN) tablet Take 1 tablet by mouth daily.        Marland Kitchen omeprazole (PRILOSEC OTC) 20 MG tablet Take 20 mg by mouth daily.        . Potassium 99 MG TABS Take 595 mg by mouth daily.       . pravastatin (PRAVACHOL) 80 MG tablet Take 80 mg by mouth daily.        . Rivaroxaban (XARELTO) 20 MG TABS Take by mouth daily.       Marland Kitchen levothyroxine (SYNTHROID, LEVOTHROID) 25 MCG tablet TAKE 1 TABLET EVERY DAY  30 tablet  0  . simvastatin (ZOCOR) 40 MG tablet Take  40 mg by mouth at bedtime.         Morphine and related  Past Medical History  Diagnosis Date  . A-fib   . HTN (hypertension)   . Diabetes mellitus   . Dyslipidemia   . PVD (peripheral vascular disease)     Past Surgical History  Procedure Date  . Carotid endarterectomy     RIGHT  . Cholecystectomy   . Tonsillectomy and adenoidectomy   . Knee surgery     History   Social History  . Marital Status: Married    Spouse Name: N/A    Number of Children: N/A  . Years of Education: N/A   Occupational History  . Not on file.   Social History Main Topics  . Smoking status: Never Smoker   . Smokeless tobacco: Not on file  . Alcohol Use: Yes     OCC.  . Drug Use:   . Sexually Active:    Other Topics Concern  . Not on file   Social History Narrative  . No narrative on file    Family History  Problem Relation Age of Onset  . Coronary artery disease Father   . Hypertension Father     . Heart attack Mother   . Coronary artery disease Mother   . Hypertension Sister     ROS: General: no fevers/chills/night sweats Eyes: no blurry vision, diplopia, or amaurosis ENT: no sore throat or hearing loss Resp: positive for dyspnea with exertion, otherwise no cough, wheezing, or hemoptysis CV: no edema or palpitations GI: no abdominal pain, nausea, vomiting, diarrhea, or constipation GU: no dysuria, frequency, or hematuria Skin: no rash Neuro: no headache, numbness, tingling, or weakness of extremities Musculoskeletal: no joint pain or swelling Heme: no bleeding, DVT, or easy bruising Endo: no polydipsia or polyuria  BP 112/63  Pulse 70  Resp 18  Ht 5' 8"  (1.727 m)  Wt 225 lb (102.059 kg)  BMI 34.21 kg/m2  PHYSICAL EXAM: Pt is alert and oriented, WD, WN, in no distress. HEENT: normal Neck: JVP normal. Carotid upstrokes normal without bruits. No thyromegaly. Lungs: equal expansion, clear bilaterally CV: Apex is discrete and nondisplaced, irregularly irregular without murmur or gallop Abd: soft, obese, NT, +BS, no bruit, no hepatosplenomegaly Back: no CVA tenderness Ext: no C/C/E        Femoral pulses 1+= without bruits        DP/PT pulses thanks on the right and 1+ on the left Skin: warm and dry without rash Neuro: CNII-XII intact             Strength intact = bilaterally  ASSESSMENT AND PLAN:

## 2010-04-28 ENCOUNTER — Ambulatory Visit (INDEPENDENT_AMBULATORY_CARE_PROVIDER_SITE_OTHER)
Admission: RE | Admit: 2010-04-28 | Discharge: 2010-04-28 | Disposition: A | Discharge status: Home / Self Care | Payer: Federal, State, Local not specified - PPO | Source: Ambulatory Visit | Attending: Cardiovascular Disease | Admitting: Cardiovascular Disease

## 2010-04-28 DIAGNOSIS — I70219 Atherosclerosis of native arteries of extremities with intermittent claudication, unspecified extremity: Secondary | ICD-10-CM

## 2010-04-28 MED ORDER — IOHEXOL 350 MG/ML SOLN
120.0000 mL | Freq: Once | INTRAVENOUS | Status: AC | PRN
  Administered 2010-04-28: 120 mL via INTRAVENOUS

## 2010-05-01 ENCOUNTER — Telehealth: Payer: Self-pay | Admitting: Cardiovascular Disease

## 2010-05-01 NOTE — Telephone Encounter (Signed)
Pt calling back re test results [phone]

## 2010-05-01 NOTE — Telephone Encounter (Signed)
Left message on pt's voicemail that this test has not been reviewed by MD at this time.  Dr Burt Knack will be in the office on 05/02/10 and we can contact pt at that time.

## 2010-05-02 ENCOUNTER — Telehealth: Payer: Self-pay | Admitting: Cardiovascular Disease

## 2010-05-02 DIAGNOSIS — I70219 Atherosclerosis of native arteries of extremities with intermittent claudication, unspecified extremity: Secondary | ICD-10-CM

## 2010-05-02 NOTE — Telephone Encounter (Signed)
The pt called back on 05/02/10 for results and a new telephone note was started.  Please refer to that documentation.

## 2010-05-02 NOTE — Telephone Encounter (Signed)
Pt calling re body scan results. Pt will be in the meet until 3pm.

## 2010-05-02 NOTE — Telephone Encounter (Signed)
Results reviewed with patient. He sees Dr Scot Dock 4/25 and I will forward results to him.

## 2010-05-02 NOTE — Telephone Encounter (Signed)
I spoke with the pt's wife and made her aware of CTA results. The pt is scheduled to see Dr Scot Dock in follow-up on 05/10/10 and Dr Burt Knack would like Dr Scot Dock to review these results at that appointment. CTA results were forwarded to Dr Scot Dock through [facility].  The pt also had borderline renal function prior to CTA. I made the pt's wife aware that the pt should remain well hydrated and have a BMP rechecked on 05/10/10. She agreed with plan.

## 2010-05-02 NOTE — Telephone Encounter (Signed)
Pt's wife calling re results of ct

## 2010-05-04 ENCOUNTER — Telehealth: Payer: Self-pay | Admitting: Cardiovascular Disease

## 2010-05-04 NOTE — Telephone Encounter (Signed)
Left message for pt that records have been sent.

## 2010-05-08 ENCOUNTER — Other Ambulatory Visit: Payer: Self-pay | Admitting: *Deleted

## 2010-05-08 DIAGNOSIS — R0602 Shortness of breath: Secondary | ICD-10-CM

## 2010-05-10 ENCOUNTER — Other Ambulatory Visit (INDEPENDENT_AMBULATORY_CARE_PROVIDER_SITE_OTHER): Payer: Federal, State, Local not specified - PPO

## 2010-05-10 ENCOUNTER — Ambulatory Visit (INDEPENDENT_AMBULATORY_CARE_PROVIDER_SITE_OTHER): Payer: Federal, State, Local not specified - PPO | Admitting: Vascular Surgery

## 2010-05-10 ENCOUNTER — Other Ambulatory Visit: Payer: Federal, State, Local not specified - PPO | Admitting: *Deleted

## 2010-05-10 DIAGNOSIS — I6529 Occlusion and stenosis of unspecified carotid artery: Secondary | ICD-10-CM

## 2010-05-10 DIAGNOSIS — Z48812 Encounter for surgical aftercare following surgery on the circulatory system: Secondary | ICD-10-CM

## 2010-05-11 NOTE — Assessment & Plan Note (Signed)
OFFICE VISIT  Kriegel, Syaire L DOB:  Jul 19, 1940                                       05/10/2010 ZESPQ#:[id number]  I saw patient in the office today for continued follow-up of his carotid disease.  This is a pleasant 70 year old gentleman who I have been following for some time with carotid disease.  Ultimately the carotid stenosis on the right progressed to greater than 80%, and in March 2011 he underwent right carotid endarterectomy with Dacron patch angioplasty. He returns for a routine follow-up visit.  Since I saw him last in March 2011, he has had no history of stroke, TIAs, expressive or receptive aphasia, or amaurosis fugax.  He apparently has had some problems with atrial fibrillation and had attempted cardioversion but states that this was not successful.  He had been complaining of pain in both legs associated with ambulation and relieved with rest, and I believe Dr. Acie Fredrickson or Dr. Burt Knack arranged for an arterial Doppler study, which was down at the [facility] office, and showed evidence of multilevel arterial occlusive disease.  He tells me that over the last year, he has developed increasing claudication symptoms in both lower extremities that involve his thighs and calves bilaterally and currently occur at less than 1 block of ambulation. This has significantly affected the quality of his life.  He denies any history of rest pain or history of nonhealing wounds.  His symptoms are equal on both legs.  SOCIAL HISTORY:  He is married.  He does not smoke cigarettes.  REVIEW OF SYSTEMS:  CARDIOVASCULAR:  He has had no chest pain, chest pressure, palpitations or arrhythmias. PULMONARY:  He has had no productive cough, bronchitis, asthma, or wheezing.  PHYSICAL EXAMINATION:  This is a pleasant 70 year old gentleman who appears his stated age.  Blood pressure 117/71, heart rate is 105, saturation 99%.  HEENT:  Unremarkable.  Lungs:  Clear  bilaterally to auscultation without rales, rhonchi or wheezing.  Cardiovascular:  I do not detect any carotid bruits.  He has a regular rate and rhythm.  He has diminished femoral pulses and absent pedal pulses.  Neurologic:  He has no focal weakness or paresthesias.  He did have a carotid duplex scan in our office today which showed a widely patent right carotid endarterectomy site with no evidence of left carotid stenosis.  I have also reviewed his studies from the [facility] office which show an ABI of 89% in the right lower extremity and 76% in the left lower extremity.  Duplex imaging suggests monophasic common femoral artery waveforms consistent with inflow disease.  He also had evidence of right mid SFA disease and left proximal popliteal artery stenosis.  He has also had a CT angiogram which suggests bilateral ostial renal artery stenoses, an occlusion of the left internal iliac artery, and a proximal right common iliac artery stenosis.  With respect to his carotid disease, he has no evidence of recurrent stenosis after carotid endarterectomy and has no significant disease on the left.  I have recommended we see him back in 1 year with a follow-up carotid duplex scan, and I have ordered that study.  With respect to his peripheral vascular disease, he has evidence of multilevel disease and also has bilateral renal artery stenoses.  I explained to patient that I do not perform renal artery angioplasty, and that it might be best  for Dr. Burt Knack to evaluate his peripheral vascular disease, as he would be better positioned to proceed with renal artery angioplasty if this was indicated.  I will continue to follow his carotid disease and certainly would be available to assist if he required any surgical options for lower extremity revascularization.  Otherwise, I will plan on seeing him back in 1 year.  He knows to call sooner if he has problems.    Judeth Cornfield. Scot Dock,  M.D. Electronically Signed  CSD/MEDQ  D:  05/10/2010  T:  05/11/2010  Job:  [id number]

## 2010-05-12 ENCOUNTER — Encounter: Payer: Self-pay | Admitting: Cardiovascular Disease

## 2010-05-15 ENCOUNTER — Telehealth: Payer: Self-pay | Admitting: Cardiovascular Disease

## 2010-05-15 NOTE — Telephone Encounter (Signed)
I spoke with the pt and he is scheduled to see Dr Burt Knack on 05/17/10 to further discuss his PVD.  The pt saw Dr Scot Dock and is upset because he was referred to a physician who cannot perform the type of procedure that is needed.  Dr Scot Dock referred the pt back to Dr Burt Knack due to renal artery stenosis.

## 2010-05-15 NOTE — Telephone Encounter (Signed)
Pt wants lauren to call him. Pt did not want to say reason for call. Pt only said re medical appt. I tried to ask more question pt stated just have lauren to call me.

## 2010-05-17 ENCOUNTER — Ambulatory Visit (INDEPENDENT_AMBULATORY_CARE_PROVIDER_SITE_OTHER): Payer: Federal, State, Local not specified - PPO | Admitting: Cardiovascular Disease

## 2010-05-17 ENCOUNTER — Encounter: Payer: Self-pay | Admitting: Cardiovascular Disease

## 2010-05-17 VITALS — BP 132/80 | HR 80 | Resp 18 | Ht 70.0 in | Wt 225.0 lb

## 2010-05-17 DIAGNOSIS — I739 Peripheral vascular disease, unspecified: Secondary | ICD-10-CM

## 2010-05-17 DIAGNOSIS — I70219 Atherosclerosis of native arteries of extremities with intermittent claudication, unspecified extremity: Secondary | ICD-10-CM

## 2010-05-17 DIAGNOSIS — I701 Atherosclerosis of renal artery: Secondary | ICD-10-CM

## 2010-05-17 IMAGING — CR DG CHEST 2V
2 series · 2 of 2 positions shown · non-contrast
Comparison: None.

CLINICAL DATA: Preop for right carotid endarterectomy, former
smoker

CHEST - 2 VIEW

[view not recorded (1 of 2)]
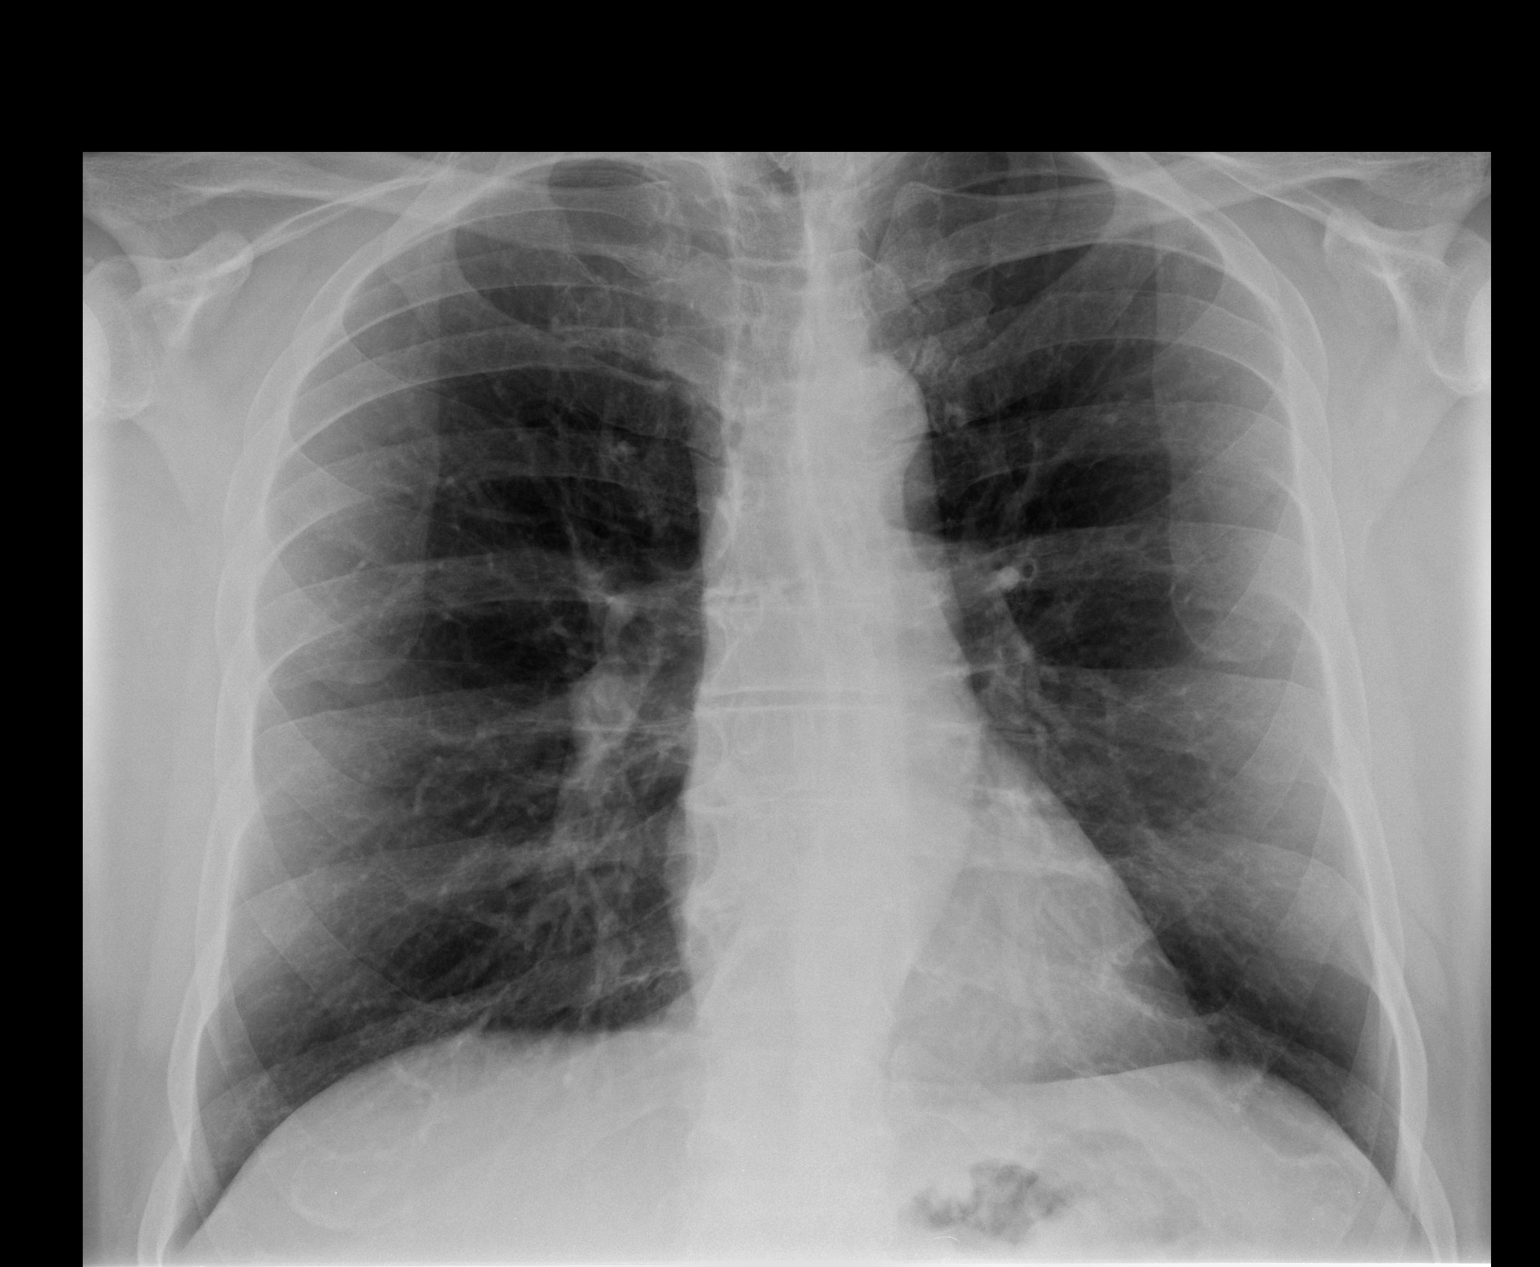

[view not recorded (2 of 2)]
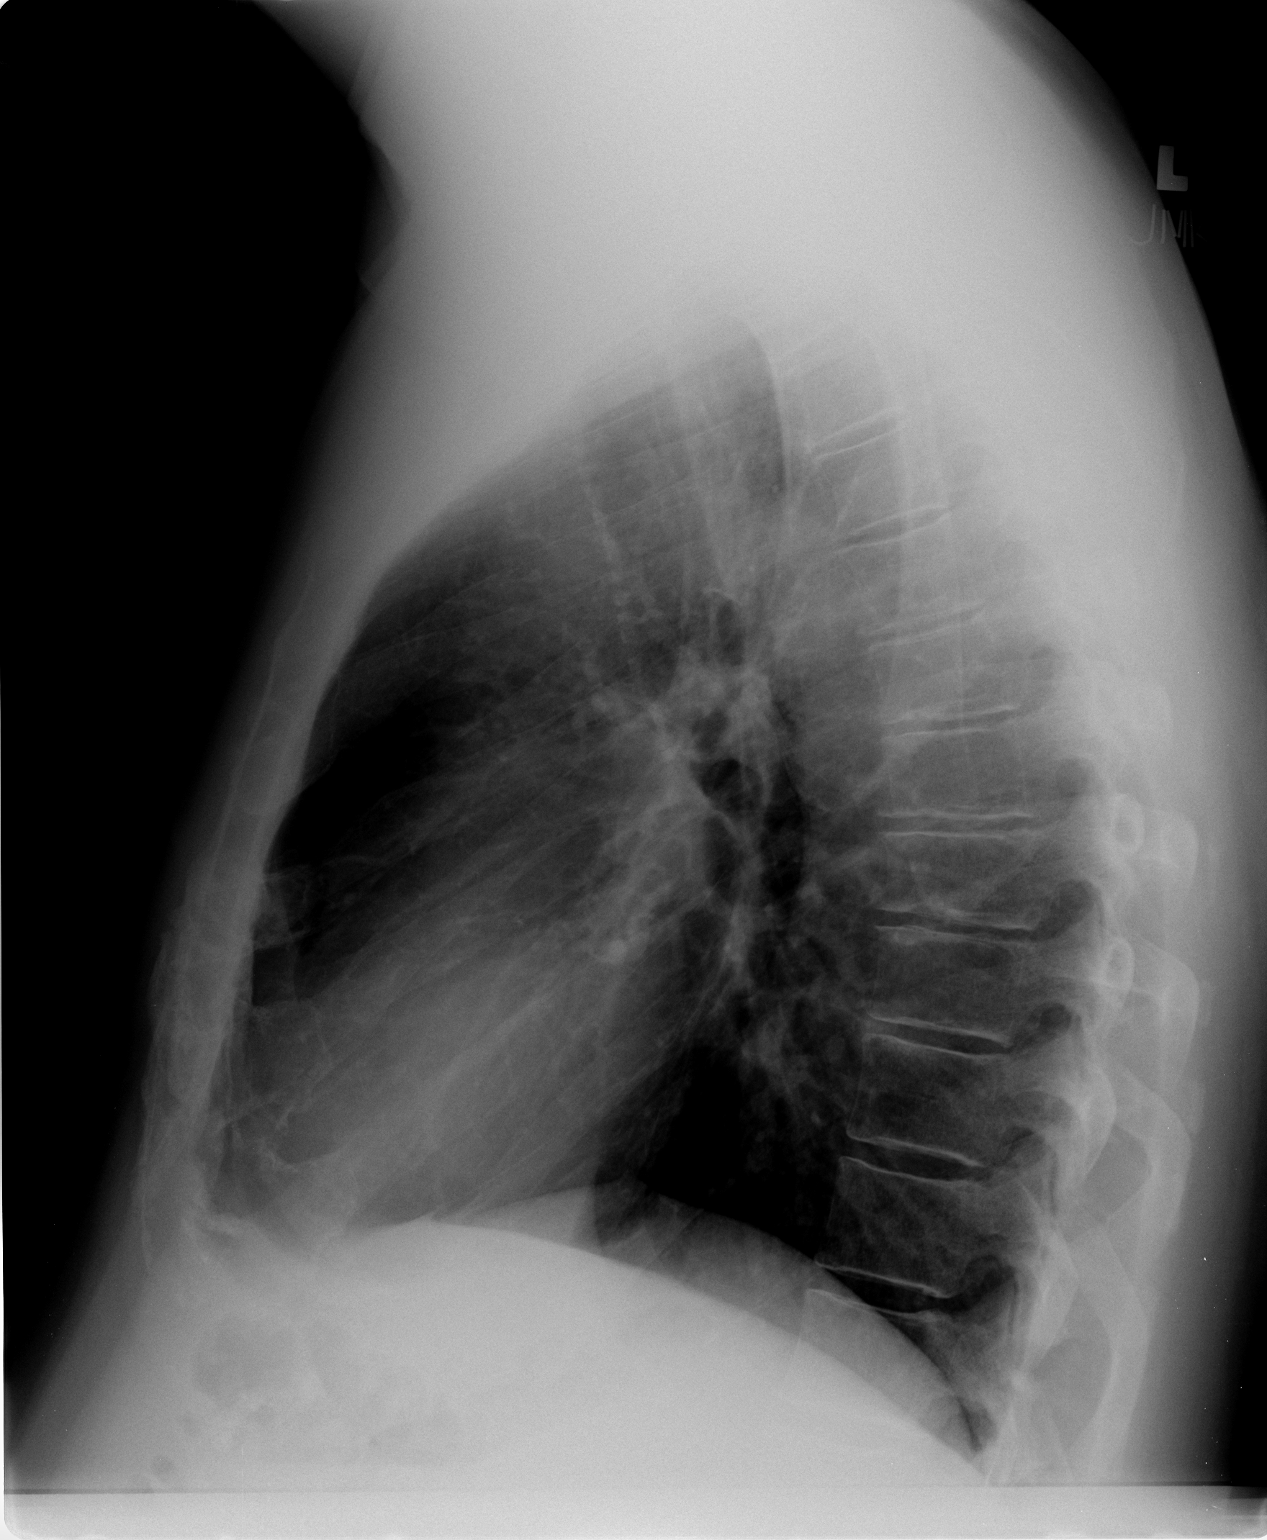

[2 of 2 positions shown; findings below may reference images not displayed]

FINDINGS: The lungs are clear and slightly hyperaerated.
Mediastinal contours are normal.  The heart is within normal limits
in size.  No bony abnormality is seen.
IMPRESSION: No active lung disease.  Slight hyperaeration.

## 2010-05-17 NOTE — Progress Notes (Signed)
HPI: [name] returns for followup of his PAD. He has developed progressive lifestyle-limiting claudication with limitation to less than one block of walking secondary to bilateral calf pain. Both legs are equally affected. He was referred for a CT angiogram on 4/13 showing severe right iliac stenosis, moderate-severe distal SFA stenosis, and 2 vessel runoff to the ankle with occlusion of the anterior tibial. On the left there is diffuse plaquing without evidence of high-grade stenosis. He was also noted to have renal artery stenosis with at least 75% on the right renal and at least moderate stenosis of the dominant, inferior renal artery on the left.  The patients other symptoms are unchanged. His atrial fibrillation is being managed by Dr Edward [name].   Outpatient Encounter Prescriptions as of 05/17/2010  Medication Sig Dispense Refill  . amLODipine (NORVASC) 5 MG tablet Take 5 mg by mouth daily.       . Calcium Carbonate-Vitamin D (CALCIUM + D PO) Take 1,200 mg by mouth daily.        . carvedilol (COREG) 25 MG tablet Take 25 mg by mouth 2 (two) times daily.       . Coenzyme Q10 (COQ10) 200 MG CAPS Take 1 capsule by mouth daily.        Marland Kitchen exenatide (BYETTA 10 MCG PEN) 10 MCG/0.04ML SOLN Inject 0.1 mLs (25 mcg total) into the skin 2 (two) times daily with a meal.  1.2 mL    . fish oil-omega-3 fatty acids 1000 MG capsule Take 1 g by mouth 2 (two) times daily.        . flecainide (TAMBOCOR) 100 MG tablet Take 100 mg by mouth 2 (two) times daily.       . hydrochlorothiazide 25 MG tablet Take 25 mg by mouth daily.       Marland Kitchen loratadine-pseudoephedrine (CLARITIN-D 24-HOUR) 10-240 MG per 24 hr tablet Take 1 tablet by mouth daily.        Marland Kitchen losartan (COZAAR) 100 MG tablet Take 100 mg by mouth daily.        . Magnesium 250 MG TABS Take 1 tablet by mouth daily.        . Multiple Vitamin (MULTIVITAMIN) tablet Take 1 tablet by mouth daily.        Marland Kitchen omeprazole (PRILOSEC OTC) 20 MG tablet Take 20 mg by mouth daily.         . Potassium 99 MG TABS Take 595 mg by mouth daily.       . Rivaroxaban (XARELTO) 20 MG TABS Take by mouth daily.       . simvastatin (ZOCOR) 40 MG tablet Take 40 mg by mouth at bedtime.       Marland Kitchen L-Methylfolate-B6-B12 (METANX PO) Take by mouth daily.        . metFORMIN (GLUCOPHAGE) 1000 MG tablet Take 1,000 mg by mouth 2 (two) times daily.        . pravastatin (PRAVACHOL) 80 MG tablet Take 80 mg by mouth daily.        Marland Kitchen DISCONTD: levothyroxine (SYNTHROID, LEVOTHROID) 25 MCG tablet TAKE 1 TABLET EVERY DAY  30 tablet  0    Allergies  Allergen Reactions  . Morphine And Related Itching    Past Medical History  Diagnosis Date  . A-fib   . HTN (hypertension)   . Diabetes mellitus   . Dyslipidemia   . PVD (peripheral vascular disease)     ROS: Negative except as per HPI  BP 132/80  Pulse 80  Resp  18  Ht 5' 10"  (1.778 m)  Wt 225 lb (102.059 kg)  BMI 32.28 kg/m2  PHYSICAL EXAM: Pt is alert and oriented, NAD HEENT: normal Lungs: CTA bilaterally CV: irregularly irregular without murmur or gallop Abd: soft, NT, Positive BS, no hepatomegaly Ext: no C/C/E, femoral pulses 1+=, pedal trace on the right and 1+ on the left Skin: warm/dry no rash  ASSESSMENT AND PLAN:

## 2010-05-17 NOTE — Procedures (Unsigned)
CAROTID DUPLEX EXAM  INDICATION:  Follow-up carotid artery stenosis.  HISTORY: Diabetes:  Yes. Cardiac:  No. Hypertension:  Yes. Smoking:  No. Previous Surgery:  Right carotid endarterectomy on 03/22/2009. CV History:  Asymptomatic. Amaurosis Fugax No, Paresthesias No, Hemiparesis No.                                      RIGHT             LEFT Brachial systolic pressure:         136               126 Brachial Doppler waveforms:         WNL               WNL Vertebral direction of flow:        To/fro            Antegrade DUPLEX VELOCITIES (cm/sec) CCA peak systolic                   83                96 ECA peak systolic                   124               82 ICA peak systolic                   69                827 ICA end diastolic                   25                35 PLAQUE MORPHOLOGY:                  N/V               Heterogenous PLAQUE AMOUNT:                      Minimal           Mild-to-moderate PLAQUE LOCATION:                    N/V               CCA/ICA  IMPRESSION: 1. Right internal carotid artery appears patent with history of     endarterectomy.  No myointimal thickening is visualized. 2. Right vertebral artery presents with abnormal to/fro flow     identified, which may suggest significant disease proximally. 3. Left internal carotid artery stenosis in the 1% to 39% range     present. 4. Left vertebral artery is antegrade and widely patent. 5. Essentially unchanged since previous study on 11/30/2009.      ___________________________________________ Judeth Cornfield. Scot Dock, M.D.  SH/MEDQ  D:  05/10/2010  T:  05/10/2010  Job:  [id number]

## 2010-05-17 NOTE — Patient Instructions (Signed)
Your physician has requested that you have a peripheral vascular angiogram. This exam is performed at the hospital. During this exam IV contrast is used to look at arterial blood flow. Please review the information sheet given for details.  Your physician recommends that you continue on your current medications as directed. Please refer to the Current Medication list given to you today.

## 2010-05-18 ENCOUNTER — Telehealth: Payer: Self-pay | Admitting: Cardiovascular Disease

## 2010-05-18 ENCOUNTER — Encounter: Payer: Self-pay | Admitting: Cardiovascular Disease

## 2010-05-18 ENCOUNTER — Ambulatory Visit (INDEPENDENT_AMBULATORY_CARE_PROVIDER_SITE_OTHER): Payer: Federal, State, Local not specified - PPO | Admitting: Cardiovascular Disease

## 2010-05-18 ENCOUNTER — Other Ambulatory Visit: Payer: Federal, State, Local not specified - PPO | Admitting: *Deleted

## 2010-05-18 ENCOUNTER — Encounter (INDEPENDENT_AMBULATORY_CARE_PROVIDER_SITE_OTHER): Payer: Federal, State, Local not specified - PPO | Admitting: *Deleted

## 2010-05-18 VITALS — BP 122/84 | HR 72 | Ht 70.0 in | Wt 225.0 lb

## 2010-05-18 DIAGNOSIS — I739 Peripheral vascular disease, unspecified: Secondary | ICD-10-CM

## 2010-05-18 DIAGNOSIS — I4891 Unspecified atrial fibrillation: Secondary | ICD-10-CM

## 2010-05-18 MED ORDER — FLECAINIDE ACETATE 100 MG PO TABS: 150.0000 mg | ORAL_TABLET | Freq: Two times a day (BID) | ORAL | Status: DC

## 2010-05-18 NOTE — Assessment & Plan Note (Signed)
It is clear that he did not stay in sinus rhythm on the flecainide 100 mg twice a day. I would like to increase his flecainide 150 mg twice a day to see if that may help. Suspect that he'll need to be admitted for initiation of Tikosyn therapy.  I like for him to go ahead and complete his workup with Dr. Burt Knack. If he needs to have several peripheral vascular stents, he'll need to come off his and also during that time.  I'll see him again in one month. We'll have him see our  Pharmacist for [place] [facility]. We will need to discontinue the HCTZ at that time.

## 2010-05-18 NOTE — Progress Notes (Signed)
Edward [name] Date of Birth  1940/06/10 [facility] / [facility] [address]     [place], [place]  [address] [phone]  Fax  [phone]  History of Present Illness:  Edward [name] is a middle-aged gentleman with a history of atrial fibrillation, hypertension, diabetes mellitus, dyslipidemia, and peripheral vascular disease. He's not felt well for the past several months. He's had recurrent atrial fibrillation and he is very asymptomatic. He was recently started on flecainide and had an outpatient cardioversion. He felt well for one day during which time he was normal rhythm. He converted back into atrial fibrillation and now feels fatigue and dyspnea.  He's had some mildly abnormal TSH levels. He recently saw Dr. Dwyane Dee who discontinue his Synthroid throat was mildly elevated TSH.  He has significant leg claudication and recently saw Dr. Burt Knack.  He apparently has significant peripheral vascular disease involving his legs. He scheduled had an angiogram in week or so according to The patient.   Current Outpatient Prescriptions on File Prior to Visit  Medication Sig Dispense Refill  . amLODipine (NORVASC) 5 MG tablet Take 5 mg by mouth daily.       . Calcium Carbonate-Vitamin D (CALCIUM + D PO) Take 1,200 mg by mouth daily.        . carvedilol (COREG) 25 MG tablet Take 25 mg by mouth 2 (two) times daily.       . Coenzyme Q10 (COQ10) 200 MG CAPS Take 1 capsule by mouth daily.        Marland Kitchen exenatide (BYETTA 10 MCG PEN) 10 MCG/0.04ML SOLN Inject 0.1 mLs (25 mcg total) into the skin 2 (two) times daily with a meal.  1.2 mL    . fish oil-omega-3 fatty acids 1000 MG capsule Take 1 g by mouth 2 (two) times daily.        . flecainide (TAMBOCOR) 100 MG tablet Take 100 mg by mouth 2 (two) times daily.       . hydrochlorothiazide 25 MG tablet Take 25 mg by mouth daily.       Marland Kitchen L-Methylfolate-B6-B12 (METANX PO) Take by mouth daily.        Marland Kitchen loratadine-pseudoephedrine  (CLARITIN-D 24-HOUR) 10-240 MG per 24 hr tablet Take 1 tablet by mouth daily.        Marland Kitchen losartan (COZAAR) 100 MG tablet Take 100 mg by mouth daily.        . Magnesium 250 MG TABS Take 1 tablet by mouth daily.        . metFORMIN (GLUCOPHAGE) 1000 MG tablet Take 1,000 mg by mouth 2 (two) times daily.        . Multiple Vitamin (MULTIVITAMIN) tablet Take 1 tablet by mouth daily.        Marland Kitchen omeprazole (PRILOSEC OTC) 20 MG tablet Take 20 mg by mouth daily.        . Potassium 99 MG TABS Take 595 mg by mouth daily.       . pravastatin (PRAVACHOL) 80 MG tablet Take 80 mg by mouth daily.        . Rivaroxaban (XARELTO) 20 MG TABS Take by mouth daily.       . simvastatin (ZOCOR) 40 MG tablet Take 40 mg by mouth at bedtime.         Allergies  Allergen Reactions  . Morphine And Related Itching    Past Medical History  Diagnosis Date  . A-fib   . HTN (hypertension)   . Diabetes  mellitus   . Dyslipidemia   . PVD (peripheral vascular disease)     Past Surgical History  Procedure Date  . Carotid endarterectomy     RIGHT  . Cholecystectomy   . Tonsillectomy and adenoidectomy   . Knee surgery     History  Smoking status  . Never Smoker   Smokeless tobacco  . Not on file    History  Alcohol Use  . Yes    OCC.    Family History  Problem Relation Age of Onset  . Coronary artery disease Father   . Hypertension Father   . Heart attack Mother   . Coronary artery disease Mother   . Hypertension Sister     Reviw of Systems:  Reviewed in the HPI.  All other systems are negative.  Physical Exam: BP 122/84  Pulse 72  Ht 5' 10"  (1.778 m)  Wt 225 lb (102.059 kg)  BMI 32.28 kg/m2 The patient is alert and oriented x 3.  The mood and affect are normal.  The skin is warm and dry.  Color is normal.  The HEENT exam reveals that the sclera are nonicteric.  The mucous membranes are moist.  The carotids are 2+ without bruits.  He has a right carotid endarterectomy scar.  There is no thyromegaly.   There is no JVD.  The lungs are clear.  The chest wall is non tender.  The heart exam reveals an irregular rate with a normal S1 and S2.  There are no murmurs, gallops, or rubs.  The PMI is not displaced.   Abdominal exam reveals good bowel sounds.  There is no guarding or rebound.  There is no hepatosplenomegaly or tenderness.  There are no masses.  Exam of the legs reveal no clubbing, cyanosis, or edema.  The legs are without rashes.  The distal pulses are intact.  Cranial nerves II - XII are intact.  Motor and sensory functions are intact.  The gait is normal.  Assessment / Plan:

## 2010-05-18 NOTE — Assessment & Plan Note (Signed)
He has a history of peripheral vascular disease. He status post right carotid endarterectomy. He'll be undergoing angiography for possible stenting of Some lower extremity arteries.

## 2010-05-18 NOTE — Telephone Encounter (Signed)
CALL PT'S WIFE ABOUT HIS MEDS, PT WAS JUST HERE.

## 2010-05-19 ENCOUNTER — Telehealth: Payer: Self-pay | Admitting: *Deleted

## 2010-05-19 ENCOUNTER — Encounter: Payer: Self-pay | Admitting: Cardiovascular Disease

## 2010-05-19 DIAGNOSIS — I701 Atherosclerosis of renal artery: Secondary | ICD-10-CM | POA: Insufficient documentation

## 2010-05-19 NOTE — Telephone Encounter (Signed)
FAX: [id number] EKG from yesterday

## 2010-05-19 NOTE — Assessment & Plan Note (Signed)
CTA reviewed with patient. He has progressive lifestyle-limiting symptoms with ABI's of 89% on the right and 76% on the left. His right ABI may not being reflective of the extent of disease as his most significant disease is in the common iliac. I have reviewed risks/indications/alternatives to lower extremity angiography and PTA/stenting with the patient and he understands. He would like to proceed. We will hold Xarelto for 3 days prior to the procedure.

## 2010-05-19 NOTE — Telephone Encounter (Signed)
Pt stated  he is taking flecainide 126m once a day ,In office he was told to increase to 1519mbid. He is calling to verify.  JoCorwin LevinsN

## 2010-05-19 NOTE — Assessment & Plan Note (Signed)
The patient has bilateral renal artery stenosis. Plan renal angiography at the time of his procedure and consider PTA if severe bilateral disease is present.

## 2010-05-19 NOTE — Telephone Encounter (Signed)
Pt called back after speaking with Dr Acie Fredrickson, confirmed to take flecainide 123m two times a day, pt verbalized understanding. Pt to F/U in one month or less if needed. JCorwin LevinsRN

## 2010-05-24 ENCOUNTER — Ambulatory Visit (HOSPITAL_COMMUNITY)
Admission: RE | Admit: 2010-05-24 | Discharge: 2010-05-24 | Disposition: A | Discharge status: Home / Self Care | Payer: Federal, State, Local not specified - PPO | Source: Ambulatory Visit | Attending: Cardiovascular Disease | Admitting: Cardiovascular Disease

## 2010-05-24 DIAGNOSIS — I70219 Atherosclerosis of native arteries of extremities with intermittent claudication, unspecified extremity: Secondary | ICD-10-CM | POA: Insufficient documentation

## 2010-05-24 DIAGNOSIS — I708 Atherosclerosis of other arteries: Secondary | ICD-10-CM | POA: Insufficient documentation

## 2010-05-24 DIAGNOSIS — Z01812 Encounter for preprocedural laboratory examination: Secondary | ICD-10-CM | POA: Insufficient documentation

## 2010-05-24 LAB — COMPREHENSIVE METABOLIC PANEL
ALT: 27 U/L (ref 0–53)
AST: 27 U/L (ref 0–37)
Alkaline Phosphatase: 76 U/L (ref 39–117)
CO2: 27 mEq/L (ref 19–32)
Chloride: 100 mEq/L (ref 96–112)
GFR calc Af Amer: >60 mL/min (ref >60)
GFR calc non Af Amer: >60 mL/min (ref >60)
Glucose, Bld: 209 mg/dL — ABNORMAL HIGH (ref 70–99)
Potassium: 4.5 mEq/L (ref 3.5–5.1)
Sodium: 137 mEq/L (ref 135–145)
Total Bilirubin: 0.7 mg/dL (ref 0.3–1.2)

## 2010-05-24 LAB — CBC
HCT: 38 % — ABNORMAL LOW (ref 39.0–52.0)
Hemoglobin: 12.3 g/dL — ABNORMAL LOW (ref 13.0–17.0)
MCH: 23.5 pg — ABNORMAL LOW (ref 26.0–34.0)
MCV: 72.7 fL — ABNORMAL LOW (ref 78.0–100.0)
Platelets: 199 10*3/uL (ref 150–400)
RBC: 5.23 MIL/uL (ref 4.22–5.81)
WBC: 9.5 10*3/uL (ref 4.0–10.5)

## 2010-05-24 LAB — GLUCOSE, CAPILLARY: Glucose-Capillary: 155 mg/dL — ABNORMAL HIGH (ref 70–99)

## 2010-05-26 ENCOUNTER — Other Ambulatory Visit (INDEPENDENT_AMBULATORY_CARE_PROVIDER_SITE_OTHER): Payer: Federal, State, Local not specified - PPO | Admitting: *Deleted

## 2010-05-26 DIAGNOSIS — R0602 Shortness of breath: Secondary | ICD-10-CM

## 2010-05-26 LAB — CBC WITH DIFFERENTIAL/PLATELET
Basophils Absolute: 0 10*3/uL (ref 0.0–0.1)
Eosinophils Absolute: 0.3 10*3/uL (ref 0.0–0.7)
Hemoglobin: 12.5 g/dL — ABNORMAL LOW (ref 13.0–17.0)
Lymphocytes Relative: 13.3 % (ref 12.0–46.0)
Lymphs Abs: 1.5 10*3/uL (ref 0.7–4.0)
MCHC: 33.1 g/dL (ref 30.0–36.0)
Monocytes Relative: 9.6 % (ref 3.0–12.0)
Neutro Abs: 8.1 10*3/uL — ABNORMAL HIGH (ref 1.4–7.7)
Platelets: 197 10*3/uL (ref 150.0–400.0)
RDW: 16.3 % — ABNORMAL HIGH (ref 11.5–14.6)

## 2010-05-26 LAB — BASIC METABOLIC PANEL
BUN: 11 mg/dL (ref 6–23)
Chloride: 101 mEq/L (ref 96–112)
Creatinine, Ser: 1.1 mg/dL (ref 0.4–1.5)
GFR: 71.51 mL/min (ref >60.00)
Potassium: 4.1 mEq/L (ref 3.5–5.1)

## 2010-05-26 LAB — BRAIN NATRIURETIC PEPTIDE: Pro B Natriuretic peptide (BNP): 115 pg/mL — ABNORMAL HIGH (ref 0.0–100.0)

## 2010-05-30 ENCOUNTER — Encounter: Payer: Self-pay | Admitting: Cardiovascular Disease

## 2010-05-30 ENCOUNTER — Ambulatory Visit (INDEPENDENT_AMBULATORY_CARE_PROVIDER_SITE_OTHER): Payer: Federal, State, Local not specified - PPO | Admitting: Cardiovascular Disease

## 2010-05-30 VITALS — BP 118/78 | HR 64 | Ht >= 80 in | Wt 225.0 lb

## 2010-05-30 DIAGNOSIS — I4891 Unspecified atrial fibrillation: Secondary | ICD-10-CM

## 2010-05-30 DIAGNOSIS — E785 Hyperlipidemia, unspecified: Secondary | ICD-10-CM | POA: Insufficient documentation

## 2010-05-30 MED ORDER — PRAVASTATIN SODIUM 80 MG PO TABS: 80.0000 mg | ORAL_TABLET | Freq: Every day | ORAL | Status: DC

## 2010-05-30 NOTE — Assessment & Plan Note (Addendum)
Edward [name] remains in atrial ablation. He's now on flecainide 150 mg twice a day. We will go ahead and try cardioversion one more time. I discussed the case with Dr. Rayann Heman who agrees with this approach. We'll get an echocardiogram to review his left atrial size.  I discussed the possibility of starting him on Tikosin but Daneil was not all that agreeable to go to the hospital for 3 days to start Tikosin.  He may opt for anticoagulation plus rate control. He's tolerating the Xarelto very well.  We will draw pre-cardioversion labs on Tuesday, May 22. We'll schedule him for cardioversion on Thursday, May 24.

## 2010-05-30 NOTE — Procedures (Signed)
CAROTID DUPLEX EXAM   INDICATION:  Carotid disease.   HISTORY:  Diabetes:  Yes.  Cardiac:  Atrial fibrillation.  Hypertension:  Yes.  Smoking:  No.  Previous Surgery:  No.  CV History:  Currently asymptomatic.  Amaurosis Fugax No, Paresthesias No, Hemiparesis No                                       RIGHT             LEFT  Brachial systolic pressure:         124               130  Brachial Doppler waveforms:         Normal            Normal  Vertebral direction of flow:        Antegrade         Antegrade  DUPLEX VELOCITIES (cm/sec)  CCA peak systolic                   94                225  ECA peak systolic                   138               750  ICA peak systolic                   407               518  ICA end diastolic                   120               51  PLAQUE MORPHOLOGY:                  Mixed             Calcific  PLAQUE AMOUNT:                      Severe            Moderate  PLAQUE LOCATION:                    ICA / ECA / CCA   ICA / ECA / CCA   IMPRESSION:  1. Doppler velocity suggests an 80%-99% stenosis of the right internal      carotid artery.  2. 40%-59% stenosis of the left internal carotid artery.  3. Increase in the right internal carotid artery velocity is noted      when compared to the previous exam on 08/31/2008 with the left      internal carotid artery remaining stable.   ___________________________________________  Judeth Cornfield. Scot Dock, M.D.   CH/MEDQ  D:  02/25/2009  T:  02/26/2009  Job:  [id number]

## 2010-05-30 NOTE — Assessment & Plan Note (Signed)
OFFICE VISIT   [name], Edward L  DOB:  17-Feb-1940                                       07/06/2008  HUTML#:[id number]   I saw the patient in the office today in consultation concerning his  bilateral lower extremity leg pain.  This is a patient who I have been  following with carotid disease.  He is asymptomatic.  On his last  followup visit in February of this year he had a 60-79% right carotid  stenosis with a 40-59% left carotid stenosis.  He was set up for a 6  month followup visit.  He is followed by Dr. Elder Cyphers and he has been  noting some increasing pain in his lower extremities associated with  ambulation and relieved with rest.  This involves his hips, thighs and  calves and occurs at approximately one city block.  Right side and left  side are equal in severity.  He has had no history of rest pain and no  history of nonhealing ulcers.  His symptoms have been relatively stable  over the last several months.   PAST MEDICAL HISTORY:  Significant for adult onset diabetes,  hypertension and hypercholesterolemia.  He denies any history of  previous myocardial infarction, history of congestive heart failure or  history of COPD.   FAMILY HISTORY:  Both his mother and father had cardiac disease.   SOCIAL HISTORY:  He is married and has one child.  He is a Designer, television/film set.  He quit tobacco well over 20 years ago.   REVIEW OF SYSTEMS:  GENERAL:  He has had no weight loss, weight gain,  problems with his appetite.  CARDIAC:  He has had no chest pain, chest pressure, palpitations or  arrhythmias.  PULMONARY:  He has had no productive cough, bronchitis, asthma or  wheezing.  VASCULAR:  He has had bilateral lower extremity claudication.  He has  had no rest pain or nonhealing ulcers.  He has had no history of DVT or  phlebitis.  He has had no history of stroke, TIAs, expressive or  receptive aphasia or amaurosis fugax.  Pulmonary, GI, GU, ortho,  psychiatric, ENT and hematologic review of  systems are unremarkable.   MEDICATIONS:  His medications are documented on the medical history form  in his chart.   PHYSICAL EXAMINATION:  This is a pleasant 70 year old gentleman who  appears his stated age.  Blood pressure is 122/71, heart rate is 66.  HEENT is unremarkable.  Neck is supple.  I do not detect any carotid  bruits.  Lungs are clear bilaterally to auscultation.  On cardiac exam  he has a regular rate and rhythm.  Abdomen is soft and nontender.  He  has palpable femoral, popliteal and dorsalis pedis pulses bilaterally.  He has no significant lower extremity swelling.  Neurologic exam is  nonfocal.  He has no ischemic ulcers.   He did have a Doppler study in our office and at rest had normal  pressures.  ABI was 100% bilaterally with triphasic Doppler signals in  the posterior tibial and dorsalis pedis positions bilaterally.  However,  with exercise he did have a drop in his ABIs consistent with possible  mild iliac disease bilaterally.   I have reassured the patient that he does have normal ABIs at rest  although he could have some mild  underlying iliac disease.  I have  encouraged him to get on a structured walking program.  I have explained  that we generally would not consider revascularization unless his  symptoms became disabling or he developed rest pain or nonhealing ulcer.  If his symptoms bother him significantly I have offered him the option  of proceeding with arteriography to see if he might have some iliac  disease which might be amenable to angioplasty.  However, currently he  is comfortable with a conservative approach.  He is due to see me back  in August for a followup carotid duplex scan as we have been following  his carotid disease.  He knows to call sooner if he has problems.  Will  follow his peripheral vascular disease at the same time also.  Clearly  if his symptoms progress we could proceed with  arteriography and  evaluate his options for addressing has mild iliac disease.   Edward [name], M.D.  Electronically Signed   CSD/MEDQ  D:  07/06/2008  T:  07/07/2008  Job:  [id number]   cc:   [name], M.D.

## 2010-05-30 NOTE — Assessment & Plan Note (Signed)
OFFICE VISIT   Gaylord, Brees L  DOB:  1940-09-06                                       03/02/2008  [id number]#:[id number]   I saw the patient in the office today for continued followup of his  carotid disease.  Since I saw him last in August of 2009 he has had no  history of stroke, TIAs, expressive or receptive aphasia or amaurosis  fugax.  There has been no significant change in his medical history.   REVIEW OF SYSTEMS:  He has had no chest pain, chest pressure,  palpitations or arrhythmias.  Review of systems is completely  unremarkable and is documented on the medical history form in his chart  as are his medications.   PHYSICAL EXAMINATION:  Vital signs:  Blood pressure is 174/85, heart  rate is 65.  HEENT:  Unremarkable.  Neck:  I do not detect any carotid  bruits.  Lungs:  Clear bilaterally to auscultation.  Cardiac:  He has a  regular rate and rhythm.  Neurologic:  Is nonfocal.   Carotid duplex scan in our office today shows that he has a 60-79% right  carotid stenosis.  The velocities on the right had increased slightly,  however, the stenosis is still clearly less than 80%.  On the left side  he has a 40-59% stenosis.  Arm pressures are essentially equal.   Again, he understands we would not consider right carotid endarterectomy  unless he developed new right hemispheric symptoms or the stenosis  progressed to greater than 80%.  I will see him back in 6 months for a  followup duplex scan.  He knows to call sooner if he has problems.  In  the meantime he knows to continue taking his aspirin.   Judeth Cornfield. Scot Dock, M.D.  Electronically Signed   CSD/MEDQ  D:  03/02/2008  T:  03/03/2008  Job:  [id number]   cc:   Richardson Landry A. Everlene Farrier, M.D.  Mr [facility]

## 2010-05-30 NOTE — Progress Notes (Signed)
Edward [name] Date of Birth  03/25/1940 [facility] / [facility] [address]     [place], [place]  [address] [phone]  Fax  [phone]  History of Present Illness:  Edward [name] presents today for followup of his atrial fibrillation. He has had stenting of the arteries in his right leg and his right leg feels a lot better. He still having some tightness in his left leg.  He has not had any symptoms related to his age fibrillation. His rate is fairly well controlled. He informed her that he was only taking flecainide 100 mg once a day at the time of his last cardioversion. All of our notes had listed the flecainide at 100 mg twice a day. Said he was actually cardioverted on a subtherapeutic dose.  He returns today to discuss further options regarding cardioversion.   Current Outpatient Prescriptions on File Prior to Visit  Medication Sig Dispense Refill  . amLODipine (NORVASC) 5 MG tablet Take 5 mg by mouth daily.       . Calcium Carbonate-Vitamin D (CALCIUM + D PO) Take 1,200 mg by mouth daily.        . carvedilol (COREG) 25 MG tablet Take 25 mg by mouth 2 (two) times daily.       . Coenzyme Q10 (COQ10) 200 MG CAPS Take 1 capsule by mouth daily.        Marland Kitchen exenatide (BYETTA 10 MCG PEN) 10 MCG/0.04ML SOLN Inject 0.1 mLs (25 mcg total) into the skin 2 (two) times daily with a meal.  1.2 mL    . fish oil-omega-3 fatty acids 1000 MG capsule Take 1 g by mouth 2 (two) times daily.        . flecainide (TAMBOCOR) 100 MG tablet Take 1.5 tablets (150 mg total) by mouth 2 (two) times daily.  90 tablet  12  . hydrochlorothiazide 25 MG tablet Take 25 mg by mouth daily.       Marland Kitchen L-Methylfolate-B6-B12 (METANX PO) Take by mouth daily.        Marland Kitchen loratadine-pseudoephedrine (CLARITIN-D 24-HOUR) 10-240 MG per 24 hr tablet Take 1 tablet by mouth daily.        Marland Kitchen losartan (COZAAR) 100 MG tablet Take 100 mg by mouth daily.        . Magnesium 250 MG TABS Take 1 tablet by  mouth daily.        . metFORMIN (GLUCOPHAGE) 1000 MG tablet Take 1,000 mg by mouth 2 (two) times daily.        . Multiple Vitamin (MULTIVITAMIN) tablet Take 1 tablet by mouth daily.        Marland Kitchen omeprazole (PRILOSEC OTC) 20 MG tablet Take 20 mg by mouth daily.        . Rivaroxaban (XARELTO) 20 MG TABS Take by mouth daily.       Marland Kitchen DISCONTD: Potassium 99 MG TABS Take 595 mg by mouth daily.       Marland Kitchen DISCONTD: pravastatin (PRAVACHOL) 80 MG tablet Take 80 mg by mouth daily.        Marland Kitchen DISCONTD: simvastatin (ZOCOR) 40 MG tablet Take 40 mg by mouth at bedtime.         Allergies  Allergen Reactions  . Lipitor (Atorvastatin Calcium)   . Morphine And Related Itching    Past Medical History  Diagnosis Date  . A-fib   . HTN (hypertension)   . Diabetes mellitus   . Dyslipidemia   . PVD (peripheral  vascular disease)     s/p Right CEA March, 2011,   . Claudication     Past Surgical History  Procedure Date  . Carotid endarterectomy     RIGHT  . Cholecystectomy   . Tonsillectomy and adenoidectomy   . Knee surgery     History  Smoking status  . Never Smoker   Smokeless tobacco  . Not on file    History  Alcohol Use  . Yes    OCC.    Family History  Problem Relation Age of Onset  . Coronary artery disease Father   . Hypertension Father   . Heart attack Mother   . Coronary artery disease Mother   . Hypertension Sister     Reviw of Systems:  Reviewed in the HPI.  All other systems are negative.  Physical Exam: BP 118/78  Pulse 64  Ht 7' (2.134 m)  Wt 225 lb (102.059 kg)  BMI 22.42 kg/m2 The patient is alert and oriented x 3.  The mood and affect are normal.  The skin is warm and dry.  Color is normal.  The HEENT exam reveals that the sclera are nonicteric.  The mucous membranes are moist.  The carotids are 2+ without bruits.  There is no thyromegaly.  There is no JVD.  The lungs are clear.  The chest wall is non tender.  The heart exam reveals an irregular rate with a normal S1  and S2.  There are no murmurs, gallops, or rubs.  The PMI is not displaced.   Abdominal exam reveals good bowel sounds.  There is no guarding or rebound.  There is no hepatosplenomegaly or tenderness.  There are no masses.  Exam of the legs reveal no clubbing, cyanosis, or edema.  The legs are without rashes.  The distal pulses are intact.  Cranial nerves II - XII are intact.  Motor and sensory functions are intact.  The gait is normal.  ECG:  Assessment / Plan:

## 2010-05-30 NOTE — Assessment & Plan Note (Signed)
OFFICE VISIT   Edward [name], Edward [name]  DOB:  December 10, 1940                                       02/25/2007  [id number]#:[id number]   Also a copy of his carotid duplex scan from today including the images.  This is required by the [employer].  This document  is included in his chart.   I saw the patient in the office today for continued followup of his  carotid disease.  This is a pleasant 70 year old gentleman whom I have  been following since 2003 with a moderate right carotid stenosis.  I  last saw him in April 2008.  Since that time, he has had no history of  stroke, TIA, expressive or receptive aphasia, or amaurosis fugax.   There has been no significant change in his medical history since April  2008.   On review of systems, he has had no recent chest pain, chest pressure,  palpitations, or arrhythmias.  He has had no bronchitis, asthma, or wheezing.   PHYSICAL EXAMINATION:  Blood pressure 125/73 on the left, 136/79 on the  right, heart rate is 66.  There is no cervical lymphadenopathy.  I did  not detect any carotid bruits.  Lungs are clear bilaterally to  auscultation.  On cardiac exam, he has a regular rate and rhythm.  Neurological exam is nonfocal.   His carotid duplex scan shows a 60-79% right carotid stenosis in the mid  range.  On the left side, he has a less than 39% stenosis.  Velocity has  been stable since his previous study.  Both vertebral arteries are  patent with normally-directed flow.   He understands we would not consider right carotid endarterectomy unless  his stenosis progressed to greater than 80%.  I will plan on seeing him  back in 6 months with a followup duplex scan.  He knows to call sooner  if he has problems.  In the meantime, he knows to continue taking his  aspirin.   Judeth Cornfield. Scot Dock, M.D.  Electronically Signed   CSD/MEDQ  D:  02/25/2007  T:  02/27/2007  Job:  [id number]   cc:   [name],  M.D.  [employer]

## 2010-05-30 NOTE — Assessment & Plan Note (Signed)
OFFICE VISIT   Pomplun, Cadence L  DOB:  1940-12-29                                       08/26/2007  [id number]#:[id number]   I saw the patient in the office today for continued followup of his  carotid disease.  Since I saw him last he has had no history of stroke,  TIAs, expressive or receptive aphasia, or amaurosis fugax.  There has  been no significant change in his medical history except that his  diabetic medication has changed.   REVIEW OF SYSTEMS:  He has had no recent chest pain, chest pressure,  palpitations or arrhythmias.  He has had no bronchitis, asthma or  wheezing.   PHYSICAL EXAMINATION:  This is a pleasant 70 year old gentleman who  appears his stated age.  Neck is supple.  There is no cervical  lymphadenopathy.  I do not detect any carotid bruits.  Blood pressure  152/82, heart rate is 65.  Neurologic exam is nonfocal.  Lungs are clear  bilaterally to auscultation.  Cardiac exam he has a regular rate and  rhythm.   Carotid duplex scan shows that he has a 60-79% right carotid stenosis  and a 40-59% left carotid stenosis.  Both vertebral arteries are patent  with normally directed flow.  Arm pressures are essentially equal.  The  velocities on the right have increased slightly.  However, the stenosis  is still less than 80%.   I have recommend a followup duplex scan in 6 months.  I will see him  back at that time.  He understands we generally would not consider right  carotid endarterectomy unless the stenosis progressed to greater than  80% or he developed new neurologic symptoms.  He does know to continue  taking his aspirin.   Judeth Cornfield. Scot Dock, M.D.  Electronically Signed   CSD/MEDQ  D:  08/26/2007  T:  08/27/2007  Job:  [id number]

## 2010-05-30 NOTE — H&P (Signed)
HISTORY AND PHYSICAL EXAMINATION   February 24, 2009   Re:  [place], [place] L               DOB:  17-Jun-1940   REASON FOR ADMISSION:  Greater than 80% right carotid stenosis.   HISTORY:  This is a pleasant 70 year old gentleman who I have been  following with moderate carotid disease.  When he presented for a  routine followup visit on 02/24/2009 it was noted that the right carotid  stenosis had progressed to greater than 80%.  Of note he is right-handed  and he has been asymptomatic.  He denies any history of stroke, TIAs,  expressive or receptive aphasia or amaurosis fugax.  He has had no  problems with dizziness.   PAST MEDICAL HISTORY:  1. Significant for adult onset diabetes.  2. Hypertension.  3. Hypercholesterolemia.  4. He denies any history of previous myocardial infarction, history of      congestive heart failure or history of COPD.   FAMILY HISTORY:  Both his mother and father had cardiac disease at an  elderly age.  There is no history of premature cardiovascular disease.   SOCIAL HISTORY:  He is married.  He has one child.  He is a Designer, television/film set.  He quit tobacco well over 20 years ago.   ALLERGIES:  No known drug allergies.   MEDICATIONS:  1. Metformin 1000 mg p.o. daily.  2. Coreg 25 mg p.o. daily.  3. Lanoxin 0.25 mg p.o. daily.  4. Diltzac ER 240 mg p.o. daily.  5. Amlodipine 5 mg p.o. daily.  6. Actos 30 mg p.o. daily.  7. Hydrochlorothiazide 25 mg p.o. daily.  8. B12 2500 mg p.o. daily.  9. Prilosec 20 mg p.o. daily.  10.Multivitamin one p.o. daily.  11.Calcium plus D 1200 mg p.o. daily.  12.Potassium gluconate one p.o. daily.  13.Byetta 10 mcg p.o. daily.  14.Micardis 80 mg p.o. daily.  15.Zocor 40 mg p.o. daily.  16.Aspirin 81 mg p.o. daily.  17.Doxazosin mesylate 8 mg p.o. daily.  18.Coenzyme Q10 one p.o. daily.  19.Losartan 100 mg p.o. daily.  20.Claritin 10 mg p.o. daily.  21.Colace p.r.n.   REVIEW OF SYSTEMS:   GENERAL:  He has had no recent weight loss, weight  gain or problems with his appetite.  CARDIOVASCULAR:  He has had no chest pain, chest pressure, palpitations  or arrhythmias.  He has had no claudication, rest pain or nonhealing  ulcers.  He has had no history of DVT or phlebitis.  Pulmonary, GI, GU, neurologic, musculoskeletal, psychiatric,  hematologic, integumentary review of systems is unremarkable.  ENT:  He had had a recent sinus infection with no fever and he is taking  amoxicillin for this now.   PHYSICAL EXAMINATION:  General:  This is a pleasant 70 year old  gentleman who appears his stated age.  Vital signs:  Blood pressure is  143/76, heart rate is 69, temperature is 98.6.  HEENT:  Unremarkable.  Lungs:  Are clear bilaterally to auscultation.  Cardiovascular:  He has  a soft right carotid bruit.  He has a regular rate and rhythm without  murmur appreciated.  He has palpable radial, femoral pulses bilaterally.  Both feet are warm and well-perfused without ischemic ulcers.  He has no  significant peripheral edema.  Abdomen:  Soft and nontender with normal  pitched bowel sounds.  No masses are appreciated.  Musculoskeletal:  He  has no major deformities or cyanosis.  Neurological:  He has  no focal  weakness or paresthesias.  Skin:  There are no ulcers or rashes.  Lymphatic:  There is no significant cervical, axillary or inguinal  lymphadenopathy.   Carotid duplex scan shows a greater than 80% right carotid stenosis with  a 40%-59% left carotid stenosis.  Both vertebral arteries are patent  with normally directed flow.   Given the progression of the right carotid stenosis to greater than 80%  I have recommended right carotid endarterectomy in order to lower his  risk of future stroke.  We have discussed the indications for the  procedure and the potential complications including but not limited to  bleeding, stroke (peri procedural risk 1%-2%), nerve injury, MI or other   unpredictable medical problems.  All of his questions were answered.  He  is agreeable to proceed.  He knows to continue his aspirin right up to  surgery.  This had been held temporarily because of a GI bleed but he  underwent colonoscopy and no significant bleeding was found.  His  surgery has been scheduled for March 8.     Judeth Cornfield. Scot Dock, M.D.  Electronically Signed   CSD/MEDQ  D:  02/24/2009  T:  02/25/2009  Job:  [id number]   cc:   [name], M.D.

## 2010-05-30 NOTE — Procedures (Signed)
CAROTID DUPLEX EXAM   INDICATION:  Followup, carotid artery disease.   HISTORY:  Diabetes:  Yes.  Cardiac:  Atrial fibrillation.  Hypertension:  Yes.  Smoking:  No.  Previous Surgery:  No.  CV History:  No.  Amaurosis Fugax No, Paresthesias No, Hemiparesis No                                       RIGHT             LEFT  Brachial systolic pressure:         124               120  Brachial Doppler waveforms:         Triphasic         Triphasic  Vertebral direction of flow:        Antegrade         Antegrade  DUPLEX VELOCITIES (cm/sec)  CCA peak systolic                   110               998  ECA peak systolic                   147               87  ICA peak systolic                   225               338  ICA end diastolic                   61                28  PLAQUE MORPHOLOGY:                  Calcified         Mixed  PLAQUE AMOUNT:                      Moderate          Mild  PLAQUE LOCATION:                    ICA, ECA          ICA   IMPRESSION:  1. 20-39% left internal carotid artery stenosis.  2. 60-79% right internal carotid artery stenosis.  3. Mild right external carotid artery stenosis.  4. Study essentially unchanged from previous studies.   ___________________________________________  Judeth Cornfield. Scot Dock, M.D.   DP/MEDQ  D:  02/25/2007  T:  02/26/2007  Job:  [id number]

## 2010-05-30 NOTE — Procedures (Signed)
CAROTID DUPLEX EXAM   INDICATION:  Follow up carotid disease.   HISTORY:  Diabetes:  Yes.  Cardiac:  Atrial fibrillation.  Hypertension:  Yes.  Smoking:  No.  Previous Surgery:  No.  CV History:  Currently asymptomatic.  Amaurosis Fugax No, Paresthesias No, Hemiparesis No.                                       RIGHT             LEFT  Brachial systolic pressure:         154               152  Brachial Doppler waveforms:         Normal            Normal  Vertebral direction of flow:        Antegrade/resistive                 Antegrade  DUPLEX VELOCITIES (cm/sec)  CCA peak systolic                   92                95  ECA peak systolic                   155               283  ICA peak systolic                   344               662  ICA end diastolic                   81                50  PLAQUE MORPHOLOGY:                  Mixed             Mixed  PLAQUE AMOUNT:                      Moderate/severe   Mild/moderate  PLAQUE LOCATION:                    ICA/ECA/CCA       ICA/ECA/CCA   IMPRESSION:  1. High-end 60-79% stenosis of the right internal carotid artery.  2. 40-59% stenosis of the left internal carotid artery.  3. Resistive and dampened flow is noted in the antegrade right      vertebral artery.  4. No significant change noted when compared to the previous exam on      08/26/07.   ___________________________________________  Judeth Cornfield. Scot Dock, M.D.   CH/MEDQ  D:  03/02/2008  T:  03/02/2008  Job:  [id number]

## 2010-05-30 NOTE — Letter (Signed)
February 24, 2009   [facility]   Re:  Edward [name], Edward [name]               DOB:  06-03-1940   To Whom it May Concern:   I saw Edward [name] in the office today for continued followup of his  carotid disease.  Since I saw him last in August of 2010 he has had no  history of stroke, TIAs, expressive or receptive aphasia or amaurosis  fugax.  He remains asymptomatic from the standpoint of his carotid  disease.  He came in for a routine 6 month followup study.   There has been no significant change in his medical history since I saw  him last.   On review of systems he has had no chest pain, chest pressure,  palpitations or arrhythmias.  He has had no significant shortness of  breath.   PHYSICAL EXAMINATION:  General:  This is a pleasant 70 year old  gentleman who appears his stated age.  Vital signs:  His blood pressure  is 139/70, heart rate is 69, temperature 98.6.  Neck:  He has a soft  right carotid bruit.  Lungs:  Clear bilaterally to auscultation.  Cardiac:  He has a regular rate and rhythm.  Neurologic:  Exam is  nonfocal.   Carotid duplex scan shows that the velocities on the right have  increased slightly and he now has a greater than 80% right carotid  stenosis with a 40%-59% left carotid stenosis.  Both vertebral arteries  are patent with normally direct flow.  Arm pressures are essentially  equal.   We have discussed the findings of his carotid duplex scan today.  He  does know to continue taking his aspirin.  Given the progression of the  stenosis on the right we are considering surgery at a future date.     Judeth Cornfield. Scot Dock, M.D.  Electronically Signed   CSD/MEDQ  D:  02/24/2009  T:  02/25/2009  Job:  [id number]

## 2010-05-30 NOTE — Patient Instructions (Addendum)
Echo this week  Pre cardioversion labs on 06/06/10 ( CBC, BMP) no INR is required since you're on Xarelto and not coumadin  Cardioversion 5/24  Continue Flecainide 150 twice a day  Office visit in 3 weeks with ECG  Electrical Cardioversion Cardioversion is the delivery of a jolt of electricity to change the rhythm of the heart. Sticky patches or metal paddles are placed on the chest to deliver the electricity from a special device. This is done to restore a normal rhythm. A rhythm that is too fast or not regular keeps the heart from pumping well. Compared to medicines used to change an abnormal rhythm, cardioversion is faster and works better. It is also unpleasant and may dislodge blood clots from the heart. WHEN WOULD THIS BE DONE?  In an emergency:   There is low or no blood pressure as a result of the heart rhythm.   Normal rhythm must be restored as fast as possible to protect the brain and heart from further damage.   It may save a life.   For less serious heart rhythms, such as atrial fibrillation or flutter, in which:   The heart is beating too fast or is not regular.   The heart is still able to pump enough blood, but not as well as it should.   Medicine to change the rhythm has not worked.   It is safe to wait in order to allow time for preparation.  BEFORE THE PROCEDURE  You may have some tests to see how well your heart is working.   You may start taking blood thinners so your blood does not clot as easily.   Other drugs may be given to help your heart work better.  LET YOUR CAREGIVER KNOW ABOUT:  Every medicine you are taking. It is very important to do this! Know when to take or stop taking any of them.   Any time in the past that you have felt your heart was not beating normally.  RISKS AND COMPLICATIONS  Clots may form in the chambers of the heart if it is beating too fast. These clots may be dislodged during the procedure and travel to other parts of the  body.   There is risk of a stroke during and after the procedure if a clot moves. Blood thinners lower this risk.   You may have a special test of your heart (TEE) to make sure there are no clots in your heart.  PROCEDURE (SCHEDULED)  The procedure is typically done in a hospital by a heart doctor (cardiologist).   You will be told when and where to go.   You may be given some medicine through an intravenous (IV) access to reduce discomfort and make you sleepy before the procedure.   Your whole body may move when the shock is delivered. Your chest may feel sore.   You may be able to go home after a few hours. Your heart rhythm will be watched to make sure it does not change.  HOME CARE INSTRUCTIONS  Only take medicine as directed by your caregiver. Be sure you understand how and when to take your medicine.   Learn how to feel your pulse and check it often.   Limit your activity for 48 hours.   Avoid caffeine and other stimulants as directed.  SEEK MEDICAL CARE IF:  You feel like your heart is beating too fast or your pulse is not regular.   You have any questions about your medicines.  You have bleeding that will not stop.  SEEK IMMEDIATE MEDICAL CARE IF:  You are dizzy or feel faint.   It is hard to breathe or you feel short of breath.   There is a change in discomfort in your chest.   Your speech is slurred or you have trouble moving your arm or leg on one side.   You get a muscle cramp.   Your fingers or toes turn cold or blue.  MAKE SURE YOU:  Understand these instructions.   Will watch your condition.   Will get help right away if you are not doing well or get worse.  Document Released: 12/22/2001 Document Re-Released: 03/28/2009 [facility] Patient Information 2011 [place].

## 2010-05-30 NOTE — Procedures (Signed)
CAROTID DUPLEX EXAM   INDICATION:  Follow up right carotid endarterectomy.   HISTORY:  Diabetes:  Yes.  Cardiac:  Atrial fibrillation.  Hypertension:  Yes.  Smoking:  No.  Previous Surgery:  Right carotid endarterectomy on 03/22/2009.  CV History:  Currently asymptomatic.  Amaurosis Fugax No, Paresthesias No, Hemiparesis No.                                       RIGHT             LEFT  Brachial systolic pressure:         138               144  Brachial Doppler waveforms:         Normal            Normal  Vertebral direction of flow:        Not visualized    Antegrade  DUPLEX VELOCITIES (cm/sec)  CCA peak systolic                   88                094  ECA peak systolic                   197               076  ICA peak systolic                   84                808  ICA end diastolic                   25                26  PLAQUE MORPHOLOGY:                  Heterogenous      Heterogenous  PLAQUE AMOUNT:                      Mild              Mild/moderate  PLAQUE LOCATION:                    ECA/CCA           ICA/ECA/CCA   IMPRESSION:  1. Patent right carotid endarterectomy site with no right internal      carotid artery stenosis.  2. No hemodynamically significant stenosis of the left internal      carotid artery with plaque formations as described above.  3. The right vertebral artery was not adequately visualized.  4. Significant improvement of the bilateral internal carotid artery      Doppler velocities noted when compared to the previous examination      on 02/24/2009.   ___________________________________________  Judeth Cornfield. Scot Dock, M.D.   CH/MEDQ  D:  11/30/2009  T:  11/30/2009  Job:  [id number]

## 2010-05-30 NOTE — Assessment & Plan Note (Signed)
I've started him on Pravachol 80 mg a day. He was intolerant to Lipitor which caused muscle aches.

## 2010-05-30 NOTE — Procedures (Signed)
CAROTID DUPLEX EXAM   INDICATION:  Followup of known carotid artery disease.  The patient is  currently asymptomatic.   HISTORY:  Diabetes:  Yes.  Cardiac:  Atrial fibrillation.  Hypertension:  Yes.  Smoking:  No.  Previous Surgery:  No.  CV History:  Amaurosis Fugax No, Paresthesias No, Hemiparesis No                                       RIGHT             LEFT  Brachial systolic pressure:         150               140  Brachial Doppler waveforms:         Triphasic         Triphasic  Vertebral direction of flow:        Antegrade (resistive)               Antegrade  DUPLEX VELOCITIES (cm/sec)  CCA peak systolic                   91                078  ECA peak systolic                   137               675  ICA peak systolic                   326               449  ICA end diastolic                   81                37  PLAQUE MORPHOLOGY:                  Calcific with shadowing             Mixed  PLAQUE AMOUNT:                      Moderate - severe Moderate  PLAQUE LOCATION:                    Bifurcation, ICA  Bifurcation, ICA   IMPRESSION:  1. Left 60-79% ICA stenosis.  2. Left  40-59% ICA stenosis.  3. Bilateral antegrade flow in vertebral arteries, however, right      vertebral artery demonstrates a resistive waveform.  4. Bilateral ICA velocities have increased from study on 02/25/2007.      ___________________________________________  Judeth Cornfield. Scot Dock, M.D.   PB/MEDQ  D:  08/27/2007  T:  08/27/2007  Job:  [id number]

## 2010-05-30 NOTE — Assessment & Plan Note (Signed)
OFFICE VISIT   Edward [name], Edward [name]  DOB:  1940-07-14                                       08/31/2008  SVXBL#:[id number]   I saw the patient in the office today for continued followup of his  carotid disease.  I have been following him with bilateral carotid  stenoses.  Since I saw him last he has had no history of stroke, TIAs,  expressive or receptive aphasia, or amaurosis fugax.   Of note, I had recently seen him in consultation with evidence of mild  bilateral lower extremity ischemia and he underwent an arteriogram which  showed some mild iliac artery occlusive disease but no focal stenosis  amenable to angioplasty.  He had no significant infrainguinal arterial  occlusive disease.  He has mild ischemic symptoms that are tolerable and  no further workup was felt necessary at this time.   REVIEW OF SYSTEMS:  He has had no recent chest pain or shortness of  breath.   PHYSICAL EXAMINATION:  This is a pleasant 70 year old gentleman who  appears his stated age.  His blood pressure is 114/67, heart rate is 60.  The neck is supple.  I do not detect any carotid bruits.  Lungs are  clear bilaterally to auscultation.  On cardiac exam he has a regular  rate and rhythm.  His neurologic exam is nonfocal.   Carotid duplex scan shows evidence of a 60-79% right carotid stenosis  with a 40-59% left carotid stenosis.  The velocities on the right have  not changed significantly and the stenosis is clearly less than 80%.   I have again explained we would only consider carotid endarterectomy if  the stenosis progressed to greater than 80% and I plan on seeing him  back in 6 months with a followup duplex scan.  We will continue to  follow his disease closely because of the risk of ischemic symptoms.  He  knows to call sooner if he has problems.  In the meantime he knows to  continue taking his aspirin.   Edward [name]. Edward [name], M.D.  Electronically Signed   CSD/MEDQ   D:  08/31/2008  T:  09/01/2008  Job:  [id number]   cc:   Edward [name], M.D.  [name]

## 2010-05-30 NOTE — Assessment & Plan Note (Signed)
OFFICE VISIT   [name], Edward [name]  DOB:  11-30-1940                                       04/07/2009  [id number]#:[id number]   I saw the patient for followup after his recent right carotid  endarterectomy.  This is a 70 year old gentleman who I have been  following with a moderate right carotid stenosis.  This progressed to  greater than 80% and right carotid endarterectomy recommended in order  to lower his risk of future stroke.  He underwent right carotid  endarterectomy with Dacron patch angioplasty on March 22, 2009, and did  well postoperatively.  He was discharged on postoperative day #1.  He  had been doing well but then returned with significant abdominal pain  and was actually found have acute gangrenous cholecystitis and underwent  laparoscopic cholecystectomy by Dr. Ninfa Linden.  He has done well from  this standpoint now and is getting back to his normal self.  He has had  no focal neurologic symptoms.   On examination, blood pressure is 124/75, heart rate is 72, temperature  98.1.  Incision in the right neck is healing nicely.  Neurologic exam is  nonfocal.   Overall, I am pleased his progress and I will see him back in 6 months  with a followup carotid duplex scan.  He knows to call sooner if he has  problems.     Judeth Cornfield. Scot Dock, M.D.  Electronically Signed   CSD/MEDQ  D:  04/07/2009  T:  04/08/2009  Job:  [id number]

## 2010-05-30 NOTE — Letter (Signed)
November 30, 2009   [employer]   Re:  Edward [name], Edward [name]               DOB:  Feb 20, 1940   To Whom It May Concern:   The patient was seen in our office today for a carotid evaluation.  This  is a pleasant 70 year old gentleman who had undergone a right carotid  endarterectomy for a high-grade right carotid stenosis in March 2011.  He was asymptomatic from this.  He came in for a routine followup  carotid duplex scan today.  Carotid duplex scan shows that the right  carotid endarterectomy site is widely patent without any evidence of  restenosis.  He has no significant stenosis on the left side.   He will continue with routine followup carotid duplex scans.  But, at  this point, he has no evidence of significant carotid disease.    Sincerely,     Judeth Cornfield. Scot Dock, M.D.  Electronically Signed   CSD/MEDQ  D:  11/30/2009  T:  12/01/2009  Job:  [id number]

## 2010-05-30 NOTE — Op Note (Signed)
NAMEWILLOUGHBY, DOELL               ACCOUNT NO.:  [id number]   MEDICAL RECORD NO.:  [id number]          PATIENT TYPE:  AMB   LOCATION:  SDS                          FACILITY:  [facility]   PHYSICIAN:  Judeth Cornfield. Scot Dock, M.D.DATE OF BIRTH:  [birth year]   DATE OF PROCEDURE:  DATE OF DISCHARGE:  08/09/2008                               OPERATIVE REPORT   PREOPERATIVE DIAGNOSIS:  Bilateral lower extremity ischemia.   POSTOPERATIVE DIAGNOSIS:  Bilateral lower extremity ischemia.   PROCEDURE:  1. Ultrasound-guided access to the right common femoral artery.  2. Aortogram with bilateral iliac arteriogram and bilateral lower      extremity runoff.   INDICATIONS:  This is a 70 year old gentleman who had bilateral lower  extremity claudication consistent with bilateral lower extremity  ischemia.  He had normal ankle-brachial indices of rest and had a drop  in his ABIs with ambulation.  He is brought in for diagnostic  arteriography and possible iliac angioplasty.   TECHNIQUE:  The patient was taken to the [facility] Lab and sedated with a  milligram of Versed and 50 mcg of fentanyl.  Both groins were prepped  and draped in the usual sterile fashion.  After the skin was infiltrated  with 1% lidocaine and under ultrasound guidance, the right common  femoral artery was cannulated and a guidewire was introduced into the  infrarenal aorta.  A 5-French sheath was introduced over the wire.  Next, the pigtail catheter was positioned at the L1 vertebral body.  Flush aortogram was obtained.  The catheter was then repositioned above  the aortic bifurcation and oblique iliac projections were obtained.  Next, bilateral lower extremity runoff films were obtained.  Additional  spot films were obtained of the legs and then an oblique was obtained in  the right femoral artery.   FINDINGS:  Single renal arteries bilaterally with no significant renal  artery stenosis identified.  The infrarenal aorta is widely  patent  without areas of significant stenosis.  There is atherosclerotic disease  and calcific disease throughout the infrarenal aorta with some mild  irregularity, but again no focal stenosis is identified.  There is some  slight plaque in the proximal right common iliac artery, but this does  not produce a significant stenosis.  This stenosis is less than 20%.  On  the left side, the common iliac artery has mild atherosclerotic disease,  but again no focal stenosis is identified.  The distal common iliac  arteries, hypogastric arteries, and external iliac arteries are patent  bilaterally.   On the right side, the common femoral, superficial femoral, deep  femoral, and popliteal arteries are all patent.  There is three-vessel  runoff on the right via the posterior tibial peroneal and anterior  tibial arteries.  The anterior tibial artery is poorly visualized  distally.  On the left side, the common femoral, superficial femoral,  deep femoral, and popliteal arteries are all patent.  There is three-  vessel runoff on the left via the anterior tibial, peroneal, and  posterior tibial arteries.  There is 1 small plaque in the mid segment  of  the anterior tibial artery which does not appear to be significant.  At the completion, I did attempt placement of the Perclose device.  The  5-French sheath was removed over a wire and the Perclose device advanced  and the wire removed.  The Perclose device was then advanced until there  was good return from the marker point and then the foot was deployed.  The catheter was then retracted until there was good hemostasis and then  the needle was deployed and then in retracting the suture it was cut and  therefore I elected to exchange the Perclose device for the 5-French  sheath and simply hold pressure at the completion of the procedure.  The  Perclose device was retracted and then the wire advanced through the  device and then the device was removed  and a 5-French sheath was placed.  The 5-French sheath was then subsequently removed and pressure was held  for hemostasis.  No immediate complications were noted.   CONCLUSIONS:  1. Mild diffuse atherosclerotic disease of the infrarenal aorta, but      no focal stenosis.  2. 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  3. No significant infrainguinal arterial occlusive disease.      Judeth Cornfield. Scot Dock, M.D.  Electronically Signed     CSD/MEDQ  D:  08/09/2008  T:  08/09/2008  Job:  [id number]   cc:   [name], M.D.

## 2010-05-30 NOTE — Procedures (Signed)
CAROTID DUPLEX EXAM   INDICATION:  Carotid disease   HISTORY:  Diabetes:  yes  Cardiac:  atrial fibrillation  Hypertension:  yes  Smoking:  no  Previous Surgery:  no  CV History:  asymptomatic.  Amaurosis Fugax No, Paresthesias No, Hemiparesis No                                       RIGHT             LEFT  Brachial systolic pressure:         112               110  Brachial Doppler waveforms:         normal            normal  Vertebral direction of flow:        antegrade/resistive                 antegrade  DUPLEX VELOCITIES (cm/sec)  CCA peak systolic                   100               85  ECA peak systolic                   109               060  ICA peak systolic                   363               045  ICA end diastolic                   91                50  PLAQUE MORPHOLOGY:                  mixed             mixed  PLAQUE AMOUNT:                      moderate/severe   moderate  PLAQUE LOCATION:                    ICA/ECA/CCA       ICA/ECA/CCA    IMPRESSION:  1. High end 60% to 79% stenosis of the right internal carotid artery.  2. A 40% to 59% stenosis of the left internal carotid artery.  3. Dampened resistive flow is noted in the antegrade right vertebral      artery.  4. No significant change noted when compared to the previous      examination on 03/02/2008.   ___________________________________________  Judeth Cornfield. Scot Dock, M.D.   CH/MEDQ  D:  09/01/2008  T:  09/02/2008  Job:  [id number]

## 2010-05-31 ENCOUNTER — Ambulatory Visit: Payer: Federal, State, Local not specified - PPO | Admitting: Cardiovascular Disease

## 2010-06-01 ENCOUNTER — Ambulatory Visit (HOSPITAL_COMMUNITY): Payer: Federal, State, Local not specified - PPO | Attending: Cardiovascular Disease | Admitting: Radiology

## 2010-06-01 DIAGNOSIS — I4891 Unspecified atrial fibrillation: Secondary | ICD-10-CM | POA: Insufficient documentation

## 2010-06-01 DIAGNOSIS — I059 Rheumatic mitral valve disease, unspecified: Secondary | ICD-10-CM | POA: Insufficient documentation

## 2010-06-02 NOTE — Discharge Summary (Signed)
NAMEKENZO, OZMENT               ACCOUNT NO.:  [id number]   MEDICAL RECORD NO.:  [id number]          PATIENT TYPE:  INP   LOCATION:  [place]                         FACILITY:  [facility]   PHYSICIAN:  John L. Rendall, M.D.  DATE OF BIRTH:  04-10-1940   DATE OF ADMISSION:  10/15/2005  DATE OF DISCHARGE:  10/18/2005                                 DISCHARGE SUMMARY   ADMITTING DIAGNOSES:  1. Osteoarthritis both knees, left more symptomatic than right.  2. Hypertension.  3. Diabetes mellitus type 2.  4. History of atrial fibrillation.  5. Hypercholesterolemia.  6. Gastroesophageal reflux disease.  7. Year-round allergies.  8. Right carotid stenosis, reported 60% occlusion.   DISCHARGE DIAGNOSES:  1. Status post left total knee arthroplasty.  2. Right knee osteoarthritis.  3. Hypertension.  4. Diabetes mellitus.  5. History of atrial fibrillation.  6. Hypercholesterolemia.  7. Gastroesophageal reflux disease.  8. Year-round allergies.  9. Right carotid stenosis.   HPI:  Mr. Meinhardt is a 70 year old white man with left knee pain for years.  Patient has a history of 3 knee arthroscopies on the left.  Pain  progressively worsening, described as intermittent, mild to moderate, dull  stabbing pain worse with exercise and ambulation.  Positive for waking pain.  The patient uses no assistive devices.  He denies any mechanical symptoms.  X-rays of the left knee show medial bone-on-bone with lateral __________  sparing.   ALLERGIES:  NO KNOWN DRUG ALLERGIES.   MEDS:  1. Metformin 1000 mg b.i.d.  2. Glipizide 2.5 mg b.i.d.  3. Hydrochlorothiazide 25 mg daily.  4. Lanoxin at 25 mg one daily.  5. Toprol 100 mg daily.  6. Tiazac 240 mg daily.  7. Claritin D daily.  8. Potassium two 99 mcg tabs daily.  9. Multivitamin one daily.  10.Prevacid 30 mg daily.  11.Fish oil two 1000 mg tablets b.i.d.  12.Diovan 320 mg daily.  13.Cardura 4 mg one daily.  14.Zetia 10 mg one daily.  15.Aspirin  81 mg one daily.  16.Simvastatin 40 mg one daily.  17.Metanx vitamin one daily.   SURGICAL PROCEDURE:  Patient was taken to the operating room on October 15, 2005, by Dr. Oretha Caprice assisted by Biagio Borg, P.A.Esau Grew, P.A.C.  Patient was placed under general anesthesia, also underwent a  supplemental femoral nerve block and then a left total knee arthroplasty was  performed using computer navigation.  The following components were used.  A  large femoral component, a large metal-backed 3 peg patella, a size 5 tibial  tray and the 10 mm insert.  The patient tolerated procedure well and  returned to Recovery in good condition.   CONSULTS:  The following consults were obtained while patient was  hospitalized:  1. Case Management.  2. PT.  3. OT.   HOSPITAL COURSE:  Postop day 1 patient afebrile, vital signs stable, H&H  11.3 and 32.8.  On postop day 2, patient afebrile, vital signs stable,  progressing along with physical therapy, some complaints of itching with  Dilaudid PCA.  The PCA was discontinued,  patient was given Benadryl for  itching.  Otherwise, the patient denied any chest pain, shortness of breath,  nausea, vomiting.  Tolerating diet well.  Pain under adequate control.   On postop day 3, the patient with no complaints, itching resolved.  Pain  under good control, patient afebrile, vital signs stable.  H&H remained  stable at 11.4 and 32.7.  Patient worked with Physical Therapy that day and  then was later discharged home in good stable condition.   LABS:  Routine labs on admission.  A CBC all values within normal limits.  Coags all values within normal limits.  Routine chemistries on admission:  Sodium 138, potassium 4.4, chloride 101, bicarb was 29, glucose was elevated  at 209, BUN was 13, creatinine 1.2, calcium was 9.5.  Hemoglobin A1c on  October 16, 2005, was elevated at 7.  Urinalysis on October 11, 2005,  showed hyaline casts, otherwise  negative.   DISCHARGE INSTRUCTIONS:  Diet:  Diabetic diet.   Activity:  The patient is weight bearing as tolerated with a walker.   Wound Care:  The patient is to keep wound clean and dry, change dressing  daily, may shower in 2 days if no drainage, call office if any signs of  infection.   Meds:  Patient was to continue home meds except for aspirin while on  Arixtra.  May resume aspirin the day after finishing Arixtra.  The following  meds should be added:  1. Arixtra 2.5 mg one daily at 8 p.m., last dose October 21, 2005, resume      aspirin on October 22, 2005.  2. Robaxin 500 mg one tab q.6-8h. p.r.n. spasm.  3. Percocet 5/325 1-2 tablets q.4-6h. for pain.   Follow up:  The patient is to follow up with Dr. Roxan Diesel office 11 days  from discharge.  Patient is to call us at [phone] for appointment.   Home Health:  PT per Arville Go.   Special Instructions:  CPM __________ 90 degrees 6-8 hours a day, increase  by 10 degrees daily.      Erskine Emery, P.A.      John L. Rendall, M.D.  Electronically Signed    GC/MEDQ  D:  11/16/2005  T:  11/16/2005  Job:  [id number]   cc:   Jenny Reichmann L. Rendall, M.D.  Fax: [phone]

## 2010-06-02 NOTE — Discharge Summary (Signed)
NAME:  Edward [name], MENDIBLES                         ACCOUNT NO.:  [id number]   MEDICAL RECORD NO.:  [id number]                   PATIENT TYPE:  INP   LOCATION:  2003                                 FACILITY:  [facility]   PHYSICIAN:  Peter M. Martinique, M.D.               DATE OF BIRTH:  08-25-1940   DATE OF ADMISSION:  07/04/2002  DATE OF DISCHARGE:  07/05/2002                                 DISCHARGE SUMMARY   HISTORY OF PRESENT ILLNESS:  The patient is a 70 year old white male who  presented with new onset of atrial fibrillation.  He had prior episode of  atrial fibrillation a few years ago.  Cardiac evaluation at that time was  negative.  ________________ this year.  He does have a history of  hypertension and diabetes mellitus, as well as a history of hypertension.  He is admitted for further management of his atrial fibrillation.   For details of his past medical history, social history, family history, and  physical examination, please see admission history and physical.   LABORATORY DATA:  White count 10,400, hemoglobin 16.2, hematocrit 46.2,  platelets 230,000.  Coags were normal.  Digoxin level was 0.3.  CK was 154  with 2.4 MB, troponin 0.02.  Sodium 139, potassium 4.2, chloride 105, CO2  26, BUN 8, glucose 29.   HOSPITAL COURSE:  The patient was admitted to telemetry. He was placed on IV  Cardizem for rate control.  EKG showed an atrial fibrillation with rapid  ventricular response, ST-T changes that were nonspecific.  Chest x-ray shows  no active disease.  The patient subsequently converted to normal sinus  rhythm at a rate of 59.  He remained in sinus rhythm throughout the night.  He ruled out for myocardial infarction by serial cardiac enzymes.  He was  consistently hypertensive throughout his hospital stay with blood pressure  increasing up to 190/80.  The patient is already taking multiple  hypertensive medications including Toprol XL, Tiazac, and Altace.  We will  recommend  the addition of diuretic for blood pressure control.  It was felt  that he was stable for discharge at this point and was discharged on July 05, 2002, in stable condition.   DISCHARGE DIAGNOSES:  1. Paroxysmal atrial fibrillation.  2. Diabetes mellitus type 2.  3. Hypertension, poorly controlled.  4. Hypercholesterolemia.  5. Carotid arterial disease.   DISCHARGE MEDICATIONS:  1. Altace 10 mg per day.  2. Toprol XL 100 mg per day.  3. Tiazac 240 mg per day.  4. Potassium 99 mg per day.  5. Glucophage 850 mg b.i.d.  6. Lipitor 20 mg per day.  7. Aspirin 81 mg per day.  8. Lanoxin 0.25 mg daily.  9. Amitriptyline 10 mg q.h.s.  10.      Neurontin 300 mg per day.  11.      Have added hydrochlorothiazide 12.5 mg per day.  RECOMMENDATIONS:  Recommend follow up with [name], M.D. later this  week.   CONDITION ON DISCHARGE:  Improved.                                               Peter M. Martinique, M.D.    PMJ/MEDQ  D:  07/05/2002  T:  07/06/2002  Job:  [id number]   cc:   [name], M.D.  [address] [place]  [place] [address]  Fax: [phone]   [name], M.D.  [facility] [address]  Fax: [phone]    cc:   [name], M.D.  [address], [place]  [place] [address]  Fax: [phone]   [name], M.D.  [facility]  [address]  [place]  [place] [address]  Fax: [phone]

## 2010-06-02 NOTE — Procedures (Signed)
[name]  Patient:    Edward [name], Edward [name]                        MRN: [id number] Proc. Date: 06/17/00 Attending:  Jim Desanctis, M.D. CC:         Dr. Lou Miner   Procedure Report  PREOPERATIVE DIAGNOSIS:  POSTOPERATIVE DIAGNOSIS:  OPERATION/PROCEDURE:  Colonoscopy.  ANESTHESIA:  Demerol 70, Versed 7 mg.  SURGEON:  Jim Desanctis, M.D.  DESCRIPTION OF PROCEDURE:  With the patient mildly sedated in the left lateral decubitus position, the Olympus videoscopic colonoscope was inserted in the rectum and passed under direct vision to the cecum, identified by ileocecal valve and appendiceal orifice, both of which are photographed from this point. The colonoscope was slowly withdrawn taking circumferential views of the entire colonic mucosa, stopping only to photograph diverticula seen along the way, mild in the right colon and moderately severe in the left colon with tortuosity seen there.  There was done until the endoscope was pulled back all the way to the rectum which appeared normal in direct and retroflexed view. The endoscope was straightened and withdrawn.  The patients vital signs and pulse oximeter ran stable.  The patient tolerated the procedure well with no apparent complications.  FINDINGS:  Diverticulosis of colon left side more than right.  PLAN:  Repeat examination in five to 10 years. DD:  06/17/00 TD:  06/17/00 Job: [id number]

## 2010-06-02 NOTE — Progress Notes (Signed)
Pt given normal results

## 2010-06-02 NOTE — H&P (Signed)
NAME:  Edward [name], Edward [name]                         ACCOUNT NO.:  [id number]   MEDICAL RECORD NO.:  [id number]                   PATIENT TYPE:  INP   LOCATION:  1823                                 FACILITY:  [facility]   PHYSICIAN:  Peter M. Martinique, M.D.               DATE OF BIRTH:  Dec 06, 1940   DATE OF ADMISSION:  07/04/2002  DATE OF DISCHARGE:                                HISTORY & PHYSICAL   HISTORY OF PRESENT ILLNESS:  The patient is a 70 year old white male with  history of hypertension, diabetes mellitus, and hypercholesterolemia.  He is  referred by [name], M.D. for evaluation of recurrent atrial  fibrillation.  The patient had atrial fibrillation approximately three years  ago.  He was anticoagulated and reports that he converted to normal sinus  rhythm after approximately two weeks.  He has had no recurrence since then.  He has no history of myocardial infarction or angina and reports a negative  stress test within the last year.  Today he was spraying his fruit trees  when he became very hot and sweaty and noticed his heart was racing.  He  presented to Urgent Care, was found to be in atrial fibrillation with rapid  ventricular response.  He denies any chest pain, shortness of breath,  dizziness, nausea, or vomiting.   PAST MEDICAL HISTORY:  1. History of atrial fibrillation.  2. Diabetes mellitus type 2.  3. Hypertension.  4. Hypercholesterolemia.  5. History of right carotid stenosis 60-80%.  6. Status post knee surgery.  7. Status post nasal surgery as a child.   MEDICATIONS:  1. Altace 10 mg daily.  2. Toprol XL 100 mg daily.  3. Tiazac 240 mg daily.  4. Potassium 99 mg daily.  5. Glucophage 500 mg b.i.d.  6. Lipitor 20 mg daily.  7. Aspirin 81 mg daily.  8. Neurontin 300 mg daily.  9. Lanoxin 0.25 mg daily.  10.      Amitriptyline 10 mg q.h.s.  11.      Multivitamin daily.   ALLERGIES:  He has no known allergies.   SOCIAL HISTORY:  The patient is a  Restaurant manager, fast food.  He is married.  He is  a Management consultant.  Denies alcohol or tobacco use.   FAMILY HISTORY:  Noncontributory.   REVIEW OF SYSTEMS:  Otherwise negative.  The patient states he has been  feeling very well and in good health.   PHYSICAL EXAMINATION:  GENERAL:  The patient is a pleasant white male in no  apparent distress.  VITAL SIGNS:  Blood pressure 160/90, pulse 130 and irregular, respirations  20.  He is afebrile.  HEENT:  The patient is balding.  Pupils are equal, round, and reactive to  light and accommodation.  extraocular movements are full.  Oropharynx is  clear.  NECK:  Without JVD, adenopathy, thyromegaly, or bruits.  LUNGS:  Clear.  CARDIAC:  Irregular rate and rhythm without murmurs, rubs, gallops, or  clicks.  ABDOMEN:  Soft, nontender.  There are no masses or hepatosplenomegaly.  EXTREMITIES:  Femoral and pedal pulses are 2+ and symmetric.  He has no  edema.  NEUROLOGIC:  Intact.   LABORATORY DATA:  Sodium 139, potassium 4.2, chloride 105, CO2 27, BUN 19,  glucose 189.  Hemoglobin 18.  ECG shows atrial fibrillation with rapid  ventricular response.  He has ST depression consistent with inferior  ischemia versus digoxin effect.   IMPRESSION:  1. Atrial fibrillation with rapid ventricular response.  2. Diabetes mellitus type 2.  3. Hypertension.  4. Hypercholesterolemia.  5. Carotid arterial disease.  6. History of peripheral neuropathy.   PLAN:  The patient will be admitted to telemetry.  Rule out myocardial  infarction.  Will check a digoxin level.  He will be treated with IV  Cardizem for rate control and anticoagulated with subcutaneous Lovenox.  Will obtain an echocardiogram.                                               Peter M. Martinique, M.D.    PMJ/MEDQ  D:  07/04/2002  T:  07/06/2002  Job:  [id number]   cc:   [name], M.D.  [address], [place]  [place] [address]  Fax: [phone]   [name], M.D.  Urgent  [place] [address]  Fax: [phone]    cc:   [name], M.D.  [address], [place]  [place] [address]  Fax: [phone]   [name], M.D.  Urgent [facility]  [address]  [place]  [place] [address]  Fax: [phone]

## 2010-06-02 NOTE — H&P (Signed)
NAMEJAMUS, LOVING               ACCOUNT NO.:  [id number]   MEDICAL RECORD NO.:  [id number]          PATIENT TYPE:  INP   LOCATION:  NA                           FACILITY:  [facility]   PHYSICIAN:  John L. Rendall, M.D.  DATE OF BIRTH:  [birth year]   DATE OF ADMISSION:  10/15/2005  DATE OF DISCHARGE:                                HISTORY & PHYSICAL   CHIEF COMPLAINT:  Left knee pain.   HISTORY OF PRESENT ILLNESS:  The patient is a 70 year old, white male with a  left pain for years, history of 3 knee arthroscopies on the left knee.  The  pain progressively worsened, described as an intermittent, mild to moderate  dull, stabbing pain, worse with exercise and ambulation.  He does have  waking pain.  He notes this is like peritonitis and mechanical symptoms.  The x-rays of the left knee showed medial bone on bone with lateral  compartment spurring.   ALLERGIES:  No known drug allergies.   MEDICATIONS:  1. Metformin 1000 mg twice a day.  2. Glipizide 2.5 mg twice a day.  3. Hydrochlorothiazide 25 mg daily.  4. Lanoxin 25 mg daily.  5. Toprol 100 mg daily.  6. Tiazac 240 mg daily.  7. Metanx daily.  8. Claritin D daily.  9. Potassium two 99 mcg tablets daily.  10.Multivitamin 1 daily.  11.Prevacid 30 mg 1 daily.  12.Fish oil two 1000 mg tablets twice a day.  13.Diovan 320 mg daily.  14.Cardura 4 mg daily.  15.Zetia 10 mg daily.  16.Aspirin 81 mg daily.  17.Simvastatin 40 mg 1 daily.   PAST MEDICAL HISTORY:  1. Hypertension.  2. Diabetes mellitus type 2.  3. Hypercholesterolemia.  4. Gastroesophageal reflux disease.  5. Year-around allergies.  6. Right carotid stenosis.  7. Right knee OA.  8. Left knee arthroscopies.  9. Atrial fibrillation.   PAST SURGICAL HISTORY:  1. Knee scope left knee April of 1988.  2. September of 1988, left knee arthroscopy.  3. Left knee arthroscopy, August of 1992.  4. Adenoidectomy and a cartilage removal in 1970's.  5. The patient denies  any complications with the above procedures.  No      blood products.   SOCIAL HISTORY:  The patient has stopped smoking in 1985.  Prior to that, he  smoked 3 packs a day for 25 years.  He has an occasional alcoholic beverage.  He lives in a 2-story home with 3 steps to the usual entrance.   FAMILY HISTORY:  The patient's mother is deceased at age 72 due to MI.  Father deceased in his 106's.  He had a history of MI and hypertension,  unsure of exact cause of demise.  No siblings.   REVIEW OF SYSTEMS:  1. The patient with a recent upper respiratory infection, however this was      treated with antibiotics and it has resolved.  2. The patient has a history of atrial fibrillation.  3. Right carotid stenosis, 60%.  4. Diabetes mellitus.  5. Hypertension.  6. Hypercholesterolemia.  7. Year around allergies.  8. Gastroesophageal  reflux disease.  9. The patient denies any current fevers, chills, or sinusitis.  10.The remainder of the review of systems is negative, or noncontributory.   PHYSICAL EXAMINATION:  GENERAL APPEARANCE:  The patient is a well-developed,  well-nourished male who walks with a limp, and antalgic gait.  The patient's  mood and affect are appropriate, talks easily with examiner.  MEASUREMENTS:  The patient's height is 5 foot 10, weight 226 pounds.  VITAL SIGNS:  Temperature 97.2 degrees Fahrenheit, pulse 80, respiratory  rate 16, blood pressure is 130/72.  CARDIAC:  Regular rate and rhythm with no murmurs, rubs or gallops noted.  LUNGS:  Lungs are clear to auscultation bilaterally, no wheezing, rhonchi or  rales noted.  ABDOMEN:  Soft, nontender, mild obesity, bowel sounds x4 quadrants.  HEENT:  Head is normocephalic, atraumatic without frontal or maxillary sinus  tenderness to palpation.  Conjunctiva is pink.  Sclera is nonicteric.  PERRLA.  EOM intact.  No visible external ear deformity.  Tympanic membranes  pearly and gray and bilaterally patent.  Mouth:  Mucosa is  pink and moist.  Nose: Nasal septum midline.  Nasal mucosa is pink and moist without polyps.  The patient has good dentition, without erythema or exudate.  Tongue and  uvula midline.  NECK:  Trachea is midline, no lymphadenopathy.  Carotids are not palpated.  No bruits are appreciated on auscultation.  No tenderness along the cervical  column.  He has full range of motion of the cervical spine without pain.  BACK:  No tenderness with palpation over the thoracic or lumbar spine.  BREASTS/GENITOURINARY/RECTAL:  Exams all deferred.  NEUROLOGIC:  Cranial nerves II through XII are grossly intact.  The lower  extremity testing reveals 5/5 strength throughout.  Deep tendon reflexes are  2+ at the patellae and 1+ at the ankles, equal and symmetric.  Patient has a  good sensation to light touch in the tips of the toes throughout.  The  patient is alert and oriented x3.  MUSCULOSKELETAL:  Upper extremities:  The patient has full range of motion  of the upper extremities.  Upper extremities, shoulders, elbows, wrists and  hands are symmetrical in size and shape.  Radial pulses are 2+ bilaterally.  Lower extremities:  Right knee is effusing, both hips with full range of  motion without pain, flexion 90 degrees in both hips causes no discomfort.  Right knee 0 to 125 degrees with flexion, varus deformity of approximately  10 degrees, no tenderness to palpation along the medial lateral joint line.  No effusion and no edema is appreciated.  Valgus and varus stressing reveals  no laxity.  Left knee 0 to 122 degrees of flexion, varus deformity of 6  degrees.  No tenderness with palpation along the medial lateral joint line.  No effusion, no edema.  Valgus and varus stressing reveals no laxity.  Calves bilaterally are nontender.  Dorsal pedal pulses are 2+ bilaterally.   IMPRESSION:  1. End-stage osteoarthritis.  2. Right knee osteoarthritis.  3. Hypertension.  4. Diabetes mellitus type 2. 5. History of  atrial fibrillation.  6. Hypercholesterolemia.  7. Gastroesophageal reflux disease.  8. Year around allergies.  9. Right carotid stenosis, reportedly 60%.   PLAN:  1. The patient to be admitted to [facility] on October 15, 2005      to undergo a left total knee arthroplasty.  2. Prior to surgery, the patient to undergo all preoperative labs and      testing.  3. The patient did receive medical clearance from his primary care      physician, Dr. Elder Cyphers, and also clearance from his cardiologist, Dr.      Acie Fredrickson of [facility].      Erskine Emery, P.A.      John L. Rendall, M.D.  Electronically Signed    GC/MEDQ  D:  10/10/2005  T:  10/10/2005  Job:  [id number]

## 2010-06-02 NOTE — Op Note (Signed)
NAMEELICEO, GLADU               ACCOUNT NO.:  [id number]   MEDICAL RECORD NO.:  [id number]          PATIENT TYPE:  INP   LOCATION:  0006                         FACILITY:  [facility]   PHYSICIAN:  John L. Rendall, M.D.  DATE OF BIRTH:  September 10, 1940   DATE OF PROCEDURE:  10/15/2005  DATE OF DISCHARGE:                                 OPERATIVE REPORT   PREOPERATIVE DIAGNOSIS:  Osteoarthritis, left knee, with significant  deformity.   SURGICAL PROCEDURE:  Left total knee arthroplasty with computer navigation  assistance.   POSTOPERATIVE DIAGNOSIS:  Osteoarthritis, left knee, with significant  deformity.   SURGEON:  John L. Rendall, M.D.   ASSISTANTAaron Edelman D. Petrarca, P.A.-C., followed by Vonita Moss. Duffy, P.A.-C.   ANESTHESIA:  General with a femoral nerve block.   PATHOLOGY:  The patient has 10 degrees of fixed varus and 7 degrees fixed  flexion of the left knee.  It will bend further to 100 degrees but will not  extend the final 7 degrees.   PROCEDURE:  Under general anesthesia with regional femoral nerve block, the  left leg was prepared with DuraPrep and draped as a sterile field.  It is  wrapped out with an Esmarch and a sterile tourniquet is used to 350 mm.  A  midline incision was made.  The patella was everted.  The knee is debrided  in preparation for computer mapping.  Two Shantz pins were placed through  accessory stab wounds in the proximal medial tibia, and two pins were placed  through the proximal incision in the medial distal femur.  The arrays are  set up.  The femoral head was identified.  The medial and lateral malleoli  are identified and then the proximal tibia and distal femur are mapped.  Once this was completed, it is determined that the knee has 10 degrees of  fixed varus with lack of extension by 7 degrees.  Were large spurs along the  medial joint line and the bone against bone is much worse on the medial side  of the knee.   Once the array is in place  and mapping has been done, the computer is used  for a cut of the proximal tibia and 0 millimeters were taken off the low  side and 12 mm off the high.  Once the proximal tibia was resected  balancing, was carried out.  The usual medial release was done and the knee  is still in 6-1/2 degrees of varus.  It is then necessary to release the  semimembranosus posterior medial corner and the posterior aspect of the  medial femoral condyle.  Even with this done the knee would only extend to a  3 degree varus position.  It was necessary to release the pes anteriorly and  once this was done, the knee would balance to within 1 degree of the  anatomical axis.  At this point the flexion gap was then measured.  The  computer was used to resect the anterior and posterior flare of the distal  femur. A 10 mm flexion gap was obtained and it  was felt to be balanced.  The  distal femoral cut was made, a 10 degrees extension gap and balance was  found,  A recessing guide was then used.  At this point unfortunately, PA  Mike Craze. Petrarca turned ill and was subsequently replaced by PA Zenaida Deed.  The procedure proceeded uneventfully and the lamina spreader was  inserted.  Remnants of the cruciates and menisci were resected.  Spurs were  taken off the back of the femoral condyle.  The tibia was exposed and  measured as a #5.  A center peg hole with keel was inserted.  A #5 tibia, a  10 bearing and large femur revealed excellent fit, alignment and stability,  within 1 degree of anatomic axis.  The patella was then osteotomized and a  three-peg patella inserted.  Permanent components were then obtained and  cemented in place. The tourniquet was then let down after the cement  hardened, and and multiple small vessels were cauterized .  A medium Hemovac  drain was inserted.  The knee was closed with #1 Tycron, #0 and #2-0 Vicryl  and skin clips.   The operative time was 1 hour and 45 minutes.  The patient  tolerated the  procedure well and returned to recovery in good condition.      John L. Rendall, M.D.  Electronically Signed     JLR/MEDQ  D:  10/15/2005  T:  10/16/2005  Job:  [id number]

## 2010-06-05 ENCOUNTER — Other Ambulatory Visit: Payer: Self-pay | Admitting: *Deleted

## 2010-06-05 DIAGNOSIS — Z01812 Encounter for preprocedural laboratory examination: Secondary | ICD-10-CM

## 2010-06-06 ENCOUNTER — Other Ambulatory Visit: Payer: Self-pay | Admitting: *Deleted

## 2010-06-06 ENCOUNTER — Telehealth: Payer: Self-pay | Admitting: *Deleted

## 2010-06-06 ENCOUNTER — Other Ambulatory Visit (INDEPENDENT_AMBULATORY_CARE_PROVIDER_SITE_OTHER): Payer: Federal, State, Local not specified - PPO | Admitting: *Deleted

## 2010-06-06 DIAGNOSIS — Z01812 Encounter for preprocedural laboratory examination: Secondary | ICD-10-CM

## 2010-06-06 DIAGNOSIS — Z0000 Encounter for general adult medical examination without abnormal findings: Secondary | ICD-10-CM

## 2010-06-06 LAB — CBC WITH DIFFERENTIAL/PLATELET
Basophils Relative: 0.8 % (ref 0.0–3.0)
Eosinophils Relative: 4.4 % (ref 0.0–5.0)
Hemoglobin: 12.8 g/dL — ABNORMAL LOW (ref 13.0–17.0)
Lymphocytes Relative: 18.4 % (ref 12.0–46.0)
MCHC: 33.1 g/dL (ref 30.0–36.0)
MCV: 73.7 fl — ABNORMAL LOW (ref 78.0–100.0)
Monocytes Absolute: 0.7 10*3/uL (ref 0.1–1.0)
Neutro Abs: 6.4 10*3/uL (ref 1.4–7.7)
Neutrophils Relative %: 69 % (ref 43.0–77.0)
RBC: 5.25 Mil/uL (ref 4.22–5.81)
WBC: 9.3 10*3/uL (ref 4.5–10.5)

## 2010-06-06 LAB — BASIC METABOLIC PANEL
CO2: 27 mEq/L (ref 19–32)
Chloride: 102 mEq/L (ref 96–112)
Creatinine, Ser: 1.3 mg/dL (ref 0.4–1.5)
Potassium: 4.3 mEq/L (ref 3.5–5.1)
Sodium: 138 mEq/L (ref 135–145)

## 2010-06-06 NOTE — Telephone Encounter (Signed)
Patient called with lab results. Corwin Levins RN

## 2010-06-06 NOTE — Telephone Encounter (Signed)
Message copied by Astrid Drafts on Tue Jun 06, 2010  5:26 PM ------      Message from: [place], [place]      Created: Tue Jun 06, 2010  3:37 PM       Slight anemia.  Glucose is still mildly elevated.      Send to primary md

## 2010-06-07 NOTE — Progress Notes (Signed)
Result given.

## 2010-06-08 ENCOUNTER — Ambulatory Visit (HOSPITAL_COMMUNITY)
Admission: RE | Admit: 2010-06-08 | Discharge: 2010-06-08 | Disposition: A | Discharge status: Home / Self Care | Payer: Federal, State, Local not specified - PPO | Source: Ambulatory Visit | Attending: Cardiovascular Disease | Admitting: Cardiovascular Disease

## 2010-06-08 DIAGNOSIS — E119 Type 2 diabetes mellitus without complications: Secondary | ICD-10-CM | POA: Insufficient documentation

## 2010-06-08 DIAGNOSIS — I4891 Unspecified atrial fibrillation: Secondary | ICD-10-CM | POA: Insufficient documentation

## 2010-06-08 DIAGNOSIS — I739 Peripheral vascular disease, unspecified: Secondary | ICD-10-CM | POA: Insufficient documentation

## 2010-06-08 DIAGNOSIS — Z01810 Encounter for preprocedural cardiovascular examination: Secondary | ICD-10-CM | POA: Insufficient documentation

## 2010-06-08 DIAGNOSIS — I440 Atrioventricular block, first degree: Secondary | ICD-10-CM | POA: Insufficient documentation

## 2010-06-08 DIAGNOSIS — I10 Essential (primary) hypertension: Secondary | ICD-10-CM | POA: Insufficient documentation

## 2010-06-08 LAB — GLUCOSE, CAPILLARY: Glucose-Capillary: 124 mg/dL — ABNORMAL HIGH (ref 70–99)

## 2010-06-13 ENCOUNTER — Telehealth: Payer: Self-pay | Admitting: Cardiovascular Disease

## 2010-06-13 NOTE — Telephone Encounter (Signed)
WIFE SAID SHE NEEDS TO SW NURSE ABOUT PT AND WOULD ONLY SAY IT WAS IMPORTANT.

## 2010-06-13 NOTE — Telephone Encounter (Signed)
Spoke with wife per pt request. To hold norvasc and take bp daily, call if further complication, reminded of f/u app. verbalized understanding.Corwin Levins RN

## 2010-06-13 NOTE — Telephone Encounter (Signed)
Pt felt dizzy this weekend, called him at work bp now is 88/52, hr 63. Told to have some salt, couple handfulls of chips or pretzels, drink fluids non caffinated, and no bp meds till he hears from [place].  Corwin Levins RN

## 2010-06-13 NOTE — Telephone Encounter (Signed)
Hold Amlodipine.  Ov soon.

## 2010-06-16 NOTE — Op Note (Signed)
  NAMEHERSHEY, KNAUER NO.:  [id number]  MEDICAL RECORD NO.:  [id number]           PATIENT TYPE:  O  LOCATION:  [facility]                         FACILITY:  [facility]  PHYSICIAN:  [name], M.D. DATE OF BIRTH:  10-09-1940  DATE OF PROCEDURE:  06/08/2010 DATE OF DISCHARGE:  06/08/2010                              OPERATIVE REPORT   Edward [name] is a 70 year old gentleman with a history of atrial fibrillation.  We cardioverted him a month or so ago on low-dose flecainide.  He was only taking 100 mg daily.  He was prescribed 100 mg twice a day, but he was only taking the dose daily.  We increased his flecainide up to 150 mg twice a day and scheduled him for a cardioversion.  The patient gave [place] informed consent.  The patient received IV sedation - propofol 120 mg IV with lidocaine 40 mg IV.  After adequate sedation, he was synchronously cardioverted using 50 joules, which was not successful in converting him.  He then received 200 joules of synchronous cardioversion with resumption of normal sinus rhythm.  He has a first-degree A-V block.  The patient tolerated the procedure quite well.  If the patient continues to have a first-degree A-V block or any episodes of presyncope then we will need to consider decreasing his dose of carvedilol.  I would like to continue the highest dose of possible carvedilol plus flecainide in order to maintain sinus rhythm.  I will see him in the office in several weeks.     [name], M.D.     PJN/MEDQ  D:  06/08/2010  T:  06/09/2010  Job:  [id number]  Electronically Signed by Mertie Moores M.D. on 06/16/2010 04:48:21 PM

## 2010-06-20 ENCOUNTER — Other Ambulatory Visit: Payer: Self-pay | Admitting: Cardiovascular Disease

## 2010-06-20 ENCOUNTER — Ambulatory Visit (INDEPENDENT_AMBULATORY_CARE_PROVIDER_SITE_OTHER): Payer: Federal, State, Local not specified - PPO | Admitting: Cardiovascular Disease

## 2010-06-20 ENCOUNTER — Encounter: Payer: Self-pay | Admitting: Cardiovascular Disease

## 2010-06-20 DIAGNOSIS — I4891 Unspecified atrial fibrillation: Secondary | ICD-10-CM

## 2010-06-20 DIAGNOSIS — E7800 Pure hypercholesterolemia, unspecified: Secondary | ICD-10-CM

## 2010-06-20 NOTE — Progress Notes (Signed)
Antar Milks Arington Date of Birth  December 06, 1940 [facility] [address], [place]  [address] [phone]  Fax  [phone]  History of Present Illness:  Pt feels great.  Feels much better in NSR.  Tolerated the cardioversion well.  He has some mild congestion.   Current Outpatient Prescriptions on File Prior to Visit  Medication Sig Dispense Refill  . Calcium Carbonate-Vitamin D (CALCIUM + D PO) Take 1,200 mg by mouth daily.        . carvedilol (COREG) 25 MG tablet Take 25 mg by mouth 2 (two) times daily.       . clopidogrel (PLAVIX) 75 MG tablet Take 75 mg by mouth daily.        . Coenzyme Q10 (COQ10) 200 MG CAPS Take 1 capsule by mouth daily.        Marland Kitchen exenatide (BYETTA 10 MCG PEN) 10 MCG/0.04ML SOLN Inject 0.1 mLs (25 mcg total) into the skin 2 (two) times daily with a meal.  1.2 mL    . fish oil-omega-3 fatty acids 1000 MG capsule Take 1 g by mouth 2 (two) times daily.        . flecainide (TAMBOCOR) 100 MG tablet Take 1.5 tablets (150 mg total) by mouth 2 (two) times daily.  90 tablet  12  . L-Methylfolate-B6-B12 (METANX PO) Take by mouth daily.        Marland Kitchen loratadine-pseudoephedrine (CLARITIN-D 24-HOUR) 10-240 MG per 24 hr tablet Take 1 tablet by mouth daily.        Marland Kitchen losartan (COZAAR) 100 MG tablet Take 100 mg by mouth daily.        . Magnesium 250 MG TABS Take 1 tablet by mouth daily.        . metFORMIN (GLUCOPHAGE) 1000 MG tablet Take 1,000 mg by mouth 2 (two) times daily.        . Multiple Vitamin (MULTIVITAMIN) tablet Take 1 tablet by mouth daily.        Marland Kitchen omeprazole (PRILOSEC OTC) 20 MG tablet Take 20 mg by mouth daily.        . potassium chloride SA (K-DUR,KLOR-CON) 20 MEQ tablet Take 20 mEq by mouth daily.        . pravastatin (PRAVACHOL) 80 MG tablet Take 1 tablet (80 mg total) by mouth daily.  90 tablet  3  . Rivaroxaban (XARELTO) 20 MG TABS Take by mouth daily.       Marland Kitchen DISCONTD: hydrochlorothiazide 25 MG tablet  Take 25 mg by mouth daily.         Allergies  Allergen Reactions  . Lipitor (Atorvastatin Calcium)   . Morphine And Related Itching    Past Medical History  Diagnosis Date  . A-fib   . HTN (hypertension)   . Diabetes mellitus   . Dyslipidemia   . PVD (peripheral vascular disease)     s/p Right CEA March, 2011,   . Claudication     Past Surgical History  Procedure Date  . Carotid endarterectomy     RIGHT  . Cholecystectomy   . Tonsillectomy and adenoidectomy   . Knee surgery     History  Smoking status  . Never Smoker   Smokeless tobacco  . Not on file    History  Alcohol Use  . Yes    OCC.    Family History  Problem Relation Age of Onset  . Coronary artery disease Father   .  Hypertension Father   . Heart attack Mother   . Coronary artery disease Mother   . Hypertension Sister     Reviw of Systems:  Reviewed in the HPI.  All other systems are negative.  Physical Exam: BP 152/90  Pulse 61  Ht 7' (2.134 m)  Wt 230 lb (104.327 kg)  BMI 22.92 kg/m2 The patient is alert and oriented x 3.  The mood and affect are normal.  The skin is warm and dry.  Color is normal.  The HEENT exam reveals that the sclera are nonicteric.  The mucous membranes are moist.  The carotids are 2+ without bruits.  There is no thyromegaly.  There is no JVD.  The lungs are clear.  The chest wall is non tender.  The heart exam reveals a regular rate with a normal S1 and S2.  There is a soft systolic murmur.  The PMI is not displaced.   Abdominal exam reveals good bowel sounds.  There is no guarding or rebound.  There is no hepatosplenomegaly or tenderness.  There are no masses.  Exam of the legs reveal no clubbing, cyanosis, or edema.  The legs are without rashes.  The distal pulses are intact.  Cranial nerves II - XII are intact.  Motor and sensory functions are intact.  The gait is normal.  ECG:  Assessment / Plan:

## 2010-06-21 ENCOUNTER — Other Ambulatory Visit: Payer: Self-pay | Admitting: *Deleted

## 2010-06-21 ENCOUNTER — Encounter: Payer: Self-pay | Admitting: Cardiovascular Disease

## 2010-06-21 NOTE — Assessment & Plan Note (Signed)
He has stayed in normal sinus rhythm. He will continue on her current dose of flecainide. His EKG reveals sinus rhythm with first-degree AV block. We'll see him in 6 months.

## 2010-07-05 ENCOUNTER — Telehealth: Payer: Self-pay | Admitting: *Deleted

## 2010-07-05 NOTE — Telephone Encounter (Signed)
No answer msg left hoping they went to pcp or er.

## 2010-07-05 NOTE — Telephone Encounter (Signed)
States Edward [name] has been having low bp 90/48, pt stopped his own bp meds last week but still has dizziness, low bp and rectal bleeding(her words were hemorrhaging)  Told to call pcp or go to er. Wife said she knows he wont go. He didn't want her to call but would try to get him to go. i will call back in awhile and see if they are going to be seen.

## 2010-07-06 ENCOUNTER — Telehealth: Payer: Self-pay | Admitting: Cardiovascular Disease

## 2010-07-06 NOTE — Telephone Encounter (Signed)
Called to tell you that her husband is fine. She said that there is no need to call back. I have pulled the chart.

## 2010-07-06 NOTE — Procedures (Signed)
NAMECHANCEY, RINGEL               ACCOUNT NO.:  [id number]  MEDICAL RECORD NO.:  [id number]           PATIENT TYPE:  O  LOCATION:  [facility]                         FACILITY:  [facility]  PHYSICIAN:  Juanda Bond. Burt Knack, MD  DATE OF BIRTH:  10-28-1940  DATE OF PROCEDURE:  05/24/2010 DATE OF DISCHARGE:                   PERIPHERAL VASCULAR INVASIVE PROCEDURE   PROCEDURES: 1. Abdominal aortogram. 2. Left external iliac angiography with runoff to the foot. 3. Right external iliac angiography with runoff to the foot. 4. Stenting of the right common iliac artery. 5. Perclose of the right common femoral artery.  SURGEON:  Juanda Bond. Burt Knack, MD  Marlise Eves INDICATIONS:  Mr. Gil is a 70 year old gentleman with multiple medical problems.  He presented with typical bilateral lifestyle-limiting calf claudication.  His symptoms have been progressive.  He underwent CT angiography and bilateral duplex scanning demonstrating severe right common iliac artery stenosis, moderately severe bilateral renal artery stenosis, and ABIs that were mildly reduced bilaterally.  The patient was referred for angiography.  Risks and indications of procedure were reviewed with the patient and informed consent was obtained.  The right groin was prepped, draped, and anesthetized with 1% lidocaine using the modified Seldinger technique. A 5-French sheath was placed in the right femoral artery.  A pigtail catheter was advanced into the suprarenal aorta and an aortogram was performed.  This demonstrated apparent stenosis of the right common iliac artery.  It did not appear critical.  I decided to run the legs individually and a crossover catheter was used access the left common iliac artery.  A Versacore wire was advanced into the left common femoral artery and straight end-hole catheter was advanced down into the left superficial femoral artery.  A pullback was performed across the common femoral into the external iliac  artery and there was no significant pressure gradient.  A left external iliac angiogram with runoff to the foot was then performed.  This demonstrated no significant stenosis throughout the left leg arterial system.  A pressure pullback was then recorded throughout the left iliac arteries into the abdominal aorta and there was no significant pressure gradient.  However, with pullback with the end-hole catheter from the distal abdominal aorta through the right common iliac artery, there was a 30-mm resting gradient with no provocation.  Multiple images were then taken from the right iliac.  A runoff down the right leg was performed and there was no significant common femoral or SFA disease.  There was disease of the right anterior tibial but there was brisk two-vessel runoff and there was late runoff of the third vessel.  A pigtail catheter was advanced back into the suprarenal aorta because it was difficult to visualize the common iliac ostium.  A left anterior oblique image was performed under digital subtraction and this confirmed severe stenosis of the ostium of the left common iliac artery.  There was at least 70% associated stenosis with a significant pressure gradient.  I elected to stent the vessel.  A 7-French bright tip sheath was advanced and the vessel was stented with a 10 x 18 mm balloon expandable stent which was deployed at 10 atmospheres.  The stent  appeared well expanded, the pressure gradient was abolished, and final angiography through a pigtail catheter in the abdominal aorta demonstrated an excellent angiographic result.  There were no immediate complications.  A Perclose device was used for femoral hemostasis.  PROCEDURAL FINDINGS:  The abdominal aorta is widely patent.  There are single renal arteries bilaterally.  The right renal artery is widely patent.  The left renal artery has 40-50% ostial stenosis but there does not appear to be hemodynamically significant  stenosis present.  Left leg:  The common and external iliac arteries are widely patent. The internal iliac artery is occluded.  The left external iliac has a mild 30-40% stenosis as it crosses the inguinal ligament into the common femoral.  The common femoral is patent.  The deep and superficial femoral arteries are widely patent.  The tibial arteries are all widely patent with three-vessel runoff.  The right common femoral is widely patent.  The deep and superficial femoral arteries are widely patent.  The popliteal and peroneal and posterior tibial arteries are widely patent.  The anterior tibial appears to be occluded and fills via collaterals.  The right common iliac artery is patent but there is a calcified 70% ostial stenosis.  The internal and external iliacs are patent on the right.  FINAL CONCLUSION: 1. 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 2. 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 3. Mild nonobstructive plaque involving the left leg arterial tree.     Juanda Bond. Burt Knack, MD     MDC/MEDQ  D:  05/24/2010  T:  05/25/2010  Job:  [id number]  cc:   [name], M.D. Judeth Cornfield. Scot Dock, M.D.  Electronically Signed by Sherren Mocha MD on 07/06/2010 07:34:58 AM

## 2010-07-10 ENCOUNTER — Encounter: Payer: Self-pay | Admitting: Cardiovascular Disease

## 2010-07-10 ENCOUNTER — Ambulatory Visit (INDEPENDENT_AMBULATORY_CARE_PROVIDER_SITE_OTHER): Payer: Federal, State, Local not specified - PPO | Admitting: Cardiovascular Disease

## 2010-07-10 VITALS — BP 120/80 | HR 76 | Resp 18 | Ht 70.0 in | Wt 225.0 lb

## 2010-07-10 DIAGNOSIS — I70219 Atherosclerosis of native arteries of extremities with intermittent claudication, unspecified extremity: Secondary | ICD-10-CM

## 2010-07-10 DIAGNOSIS — I6529 Occlusion and stenosis of unspecified carotid artery: Secondary | ICD-10-CM

## 2010-07-10 NOTE — Patient Instructions (Signed)
Your physician wants you to follow-up in: 12 months with Dr. Burt Knack. You will receive a reminder letter in the mail two months in advance. If you don't receive a letter, please call our office to schedule the follow-up appointment. Your physician has requested that you have an ankle brachial index (ABI). During this test an ultrasound and blood pressure cuff are used to evaluate the arteries that supply the arms and legs with blood. Allow thirty minutes for this exam. There are no restrictions or special instructions. To be done in one year before office visit with Dr. Burt Knack. Your physician has requested that you have an abdominal aorta duplex. During this test, an ultrasound is used to evaluate the aorta. Allow 30 minutes for this exam. Do not eat after midnight the day before and avoid carbonated beverages. To be done in one year before office visit with Dr. Burt Knack.

## 2010-07-10 NOTE — Progress Notes (Signed)
HPI:  This is a 70 year old gentleman presenting for followup evaluation of lower extremity peripheral arterial disease. He is status post recent stenting of the right common iliac artery. He was treated for intermittent claudication and reports complete resolution of his right leg symptoms with exertion. He continues to have left thigh and calf tightness and pain with walking. He was not found to have any significant arterial disease in the left leg. He has undergone cardioversion for treatment of atrial fibrillation and feels much better overall since getting back into sinus rhythm. He has no other complaints at present.  Outpatient Encounter Prescriptions as of 07/10/2010  Medication Sig Dispense Refill  . Calcium Carbonate-Vitamin D (CALCIUM + D PO) Take 1,200 mg by mouth daily.        . carvedilol (COREG) 25 MG tablet Take 25 mg by mouth 2 (two) times daily.       . Coenzyme Q10 (COQ10) 200 MG CAPS Take 1 capsule by mouth daily.        . Cyanocobalamin (VITAMIN B-12) 5000 MCG SUBL Place 1 tablet under the tongue daily.        Marland Kitchen exenatide (BYETTA 10 MCG PEN) 10 MCG/0.04ML SOLN Inject 0.1 mLs (25 mcg total) into the skin 2 (two) times daily with a meal.  1.2 mL    . fish oil-omega-3 fatty acids 1000 MG capsule Take 1 g by mouth 2 (two) times daily.        . flecainide (TAMBOCOR) 100 MG tablet Take 1.5 tablets (150 mg total) by mouth 2 (two) times daily.  90 tablet  12  . hydrochlorothiazide 25 MG tablet Take 25 mg by mouth daily.        Marland Kitchen L-Methylfolate-B6-B12 (METANX PO) Take by mouth daily.        Marland Kitchen loratadine-pseudoephedrine (CLARITIN-D 24-HOUR) 10-240 MG per 24 hr tablet Take 1 tablet by mouth daily.        Marland Kitchen losartan (COZAAR) 100 MG tablet Take 100 mg by mouth daily.        . Magnesium 250 MG TABS Take 1 tablet by mouth daily.        . metFORMIN (GLUCOPHAGE) 1000 MG tablet Take 1,000 mg by mouth 2 (two) times daily.        . Multiple Vitamin (MULTIVITAMIN) tablet Take 1 tablet by mouth daily.         Marland Kitchen omeprazole (PRILOSEC OTC) 20 MG tablet Take 20 mg by mouth daily.        . potassium chloride SA (K-DUR,KLOR-CON) 20 MEQ tablet Take 20 mEq by mouth daily.        . pravastatin (PRAVACHOL) 80 MG tablet Take 1 tablet (80 mg total) by mouth daily.  90 tablet  3  . DISCONTD: furosemide (LASIX) 40 MG tablet Take 40 mg by mouth 2 (two) times daily.        Marland Kitchen DISCONTD: PLAVIX 75 MG tablet TAKE 1 TABLET BY MOUTH EVERY DAY  30 tablet  0  . DISCONTD: Rivaroxaban (XARELTO) 20 MG TABS Take by mouth daily.         Allergies  Allergen Reactions  . Lipitor (Atorvastatin Calcium)   . Morphine And Related Itching    Past Medical History  Diagnosis Date  . A-fib   . HTN (hypertension)   . Diabetes mellitus   . Dyslipidemia   . PVD (peripheral vascular disease)     s/p Right CEA March, 2011,   . Claudication     ROS: Negative  except as per HPI  BP 120/80  Pulse 76  Resp 18  Ht 5' 10"  (1.778 m)  Wt 225 lb (102.059 kg)  BMI 32.28 kg/m2  PHYSICAL EXAM: Pt is alert and oriented, overweight male in NAD HEENT: normal Neck: JVP - normal, carotids 2+= without bruits Lungs: CTA bilaterally CV: RRR without murmur or gallop Abd: soft, NT, Positive BS, no hepatomegaly Ext: no C/C/E, distal pulses intact and equal Skin: warm/dry no rash  ASSESSMENT AND PLAN:

## 2010-07-10 NOTE — Assessment & Plan Note (Signed)
The patient is stable following right iliac stenting. He reports complete resolution of his right leg pain. Unfortunately he continues to have some limitation from left leg pain. His symptoms are fairly typical of claudication, but he does not have obstructive disease in the left iliofemoral arterial system. We'll continue with his current medical therapy and recommend followup evaluation in one year with ABIs, aortoiliac duplex, and an office visit.

## 2010-07-11 NOTE — Progress Notes (Signed)
Addended by: Thompson Grayer on: 07/11/2010 08:48 AM   Modules accepted: Orders

## 2010-07-18 ENCOUNTER — Other Ambulatory Visit: Payer: Self-pay | Admitting: *Deleted

## 2010-07-18 MED ORDER — CLOPIDOGREL BISULFATE 75 MG PO TABS: 75.0000 mg | ORAL_TABLET | Freq: Every day | ORAL | Status: DC

## 2010-07-28 ENCOUNTER — Telehealth: Payer: Self-pay | Admitting: Cardiovascular Disease

## 2010-07-28 NOTE — Telephone Encounter (Signed)
Unable to find lasix on med list, mrs Deems will call PCP to have them refill.

## 2010-07-28 NOTE — Telephone Encounter (Signed)
Called needing her husband prescription of Lasix refilled at the [facility] on [address] [phone]. Also wants you to know that her husband is on [place]. Please call back. I have pulled his chart.

## 2010-07-29 ENCOUNTER — Inpatient Hospital Stay (HOSPITAL_COMMUNITY)
Admission: AD | Admit: 2010-07-29 | Discharge: 2010-08-01 | DRG: 174 | Disposition: A | Discharge status: Home / Self Care | Payer: Federal, State, Local not specified - PPO | Source: Other Acute Inpatient Hospital | Attending: Family Medicine | Admitting: Family Medicine

## 2010-07-29 ENCOUNTER — Inpatient Hospital Stay (HOSPITAL_COMMUNITY): Payer: Federal, State, Local not specified - PPO

## 2010-07-29 DIAGNOSIS — E876 Hypokalemia: Secondary | ICD-10-CM | POA: Diagnosis not present

## 2010-07-29 DIAGNOSIS — I4891 Unspecified atrial fibrillation: Secondary | ICD-10-CM | POA: Diagnosis present

## 2010-07-29 DIAGNOSIS — K922 Gastrointestinal hemorrhage, unspecified: Principal | ICD-10-CM | POA: Diagnosis present

## 2010-07-29 DIAGNOSIS — K625 Hemorrhage of anus and rectum: Secondary | ICD-10-CM

## 2010-07-29 DIAGNOSIS — E119 Type 2 diabetes mellitus without complications: Secondary | ICD-10-CM

## 2010-07-29 DIAGNOSIS — I951 Orthostatic hypotension: Secondary | ICD-10-CM | POA: Diagnosis present

## 2010-07-29 DIAGNOSIS — K449 Diaphragmatic hernia without obstruction or gangrene: Secondary | ICD-10-CM | POA: Diagnosis present

## 2010-07-29 DIAGNOSIS — I739 Peripheral vascular disease, unspecified: Secondary | ICD-10-CM

## 2010-07-29 DIAGNOSIS — I10 Essential (primary) hypertension: Secondary | ICD-10-CM

## 2010-07-29 DIAGNOSIS — K5730 Diverticulosis of large intestine without perforation or abscess without bleeding: Secondary | ICD-10-CM | POA: Diagnosis present

## 2010-07-29 DIAGNOSIS — D649 Anemia, unspecified: Secondary | ICD-10-CM | POA: Diagnosis present

## 2010-07-29 DIAGNOSIS — T45515A Adverse effect of anticoagulants, initial encounter: Secondary | ICD-10-CM | POA: Diagnosis present

## 2010-07-29 DIAGNOSIS — K648 Other hemorrhoids: Secondary | ICD-10-CM | POA: Diagnosis present

## 2010-07-29 DIAGNOSIS — R911 Solitary pulmonary nodule: Secondary | ICD-10-CM | POA: Diagnosis present

## 2010-07-29 DIAGNOSIS — K644 Residual hemorrhoidal skin tags: Secondary | ICD-10-CM | POA: Diagnosis present

## 2010-07-29 DIAGNOSIS — E785 Hyperlipidemia, unspecified: Secondary | ICD-10-CM | POA: Diagnosis present

## 2010-07-29 DIAGNOSIS — R509 Fever, unspecified: Secondary | ICD-10-CM | POA: Diagnosis present

## 2010-07-29 LAB — COMPREHENSIVE METABOLIC PANEL
Albumin: 2.8 g/dL — ABNORMAL LOW (ref 3.5–5.2)
Alkaline Phosphatase: 60 U/L (ref 39–117)
BUN: 17 mg/dL (ref 6–23)
Calcium: 8.3 mg/dL — ABNORMAL LOW (ref 8.4–10.5)
Creatinine, Ser: 1.46 mg/dL — ABNORMAL HIGH (ref 0.50–1.35)
GFR calc Af Amer: 58 mL/min — ABNORMAL LOW (ref >60)
Glucose, Bld: 90 mg/dL (ref 70–99)
Total Protein: 6.2 g/dL (ref 6.0–8.3)

## 2010-07-29 LAB — GLUCOSE, CAPILLARY
Glucose-Capillary: 85 mg/dL (ref 70–99)
Glucose-Capillary: 98 mg/dL (ref 70–99)

## 2010-07-29 LAB — CBC
HCT: 24.9 % — ABNORMAL LOW (ref 39.0–52.0)
Hemoglobin: 8 g/dL — ABNORMAL LOW (ref 13.0–17.0)
MCH: 22.2 pg — ABNORMAL LOW (ref 26.0–34.0)
MCHC: 32.1 g/dL (ref 30.0–36.0)
Platelets: 168 10*3/uL (ref 150–400)
RBC: 3.63 MIL/uL — ABNORMAL LOW (ref 4.22–5.81)
RDW: 17.4 % — ABNORMAL HIGH (ref 11.5–15.5)
RDW: 17.1 % — ABNORMAL HIGH (ref 11.5–15.5)
WBC: 6.6 10*3/uL (ref 4.0–10.5)

## 2010-07-29 LAB — PROTIME-INR
INR: 1.19 (ref 0.00–1.49)
Prothrombin Time: 15.4 seconds — ABNORMAL HIGH (ref 11.6–15.2)

## 2010-07-29 LAB — SEDIMENTATION RATE: Sed Rate: 40 mm/hr — ABNORMAL HIGH (ref 0–16)

## 2010-07-30 LAB — OCCULT BLOOD X 1 CARD TO LAB, STOOL: Fecal Occult Bld: POSITIVE

## 2010-07-30 LAB — GLUCOSE, CAPILLARY
Glucose-Capillary: 100 mg/dL — ABNORMAL HIGH (ref 70–99)
Glucose-Capillary: 125 mg/dL — ABNORMAL HIGH (ref 70–99)
Glucose-Capillary: 103 mg/dL — ABNORMAL HIGH (ref 70–99)
Glucose-Capillary: 111 mg/dL — ABNORMAL HIGH (ref 70–99)

## 2010-07-30 LAB — COMPREHENSIVE METABOLIC PANEL
CO2: 26 mEq/L (ref 19–32)
Calcium: 7.9 mg/dL — ABNORMAL LOW (ref 8.4–10.5)
Creatinine, Ser: 1.27 mg/dL (ref 0.50–1.35)
GFR calc Af Amer: >60 mL/min (ref >60)
GFR calc non Af Amer: 56 mL/min — ABNORMAL LOW (ref >60)
Glucose, Bld: 96 mg/dL (ref 70–99)
Total Protein: 5.9 g/dL — ABNORMAL LOW (ref 6.0–8.3)

## 2010-07-30 LAB — CBC
Hemoglobin: 7.7 g/dL — ABNORMAL LOW (ref 13.0–17.0)
RBC: 3.5 MIL/uL — ABNORMAL LOW (ref 4.22–5.81)

## 2010-07-31 ENCOUNTER — Inpatient Hospital Stay (HOSPITAL_COMMUNITY): Payer: Federal, State, Local not specified - PPO

## 2010-07-31 ENCOUNTER — Other Ambulatory Visit: Payer: Self-pay | Admitting: Gastroenterology

## 2010-07-31 LAB — CBC
MCH: 22.3 pg — ABNORMAL LOW (ref 26.0–34.0)
MCHC: 32.1 g/dL (ref 30.0–36.0)
MCV: 69.4 fL — ABNORMAL LOW (ref 78.0–100.0)
Platelets: 177 10*3/uL (ref 150–400)
RDW: 17.5 % — ABNORMAL HIGH (ref 11.5–15.5)

## 2010-07-31 LAB — PREPARE RBC (CROSSMATCH)

## 2010-07-31 LAB — IRON AND TIBC: Iron: <10 ug/dL — ABNORMAL LOW (ref 42–135)

## 2010-07-31 LAB — BASIC METABOLIC PANEL
CO2: 24 mEq/L (ref 19–32)
Calcium: 8.2 mg/dL — ABNORMAL LOW (ref 8.4–10.5)
Creatinine, Ser: 1.06 mg/dL (ref 0.50–1.35)
Glucose, Bld: 109 mg/dL — ABNORMAL HIGH (ref 70–99)
Sodium: 135 mEq/L (ref 135–145)

## 2010-07-31 LAB — GLUCOSE, CAPILLARY: Glucose-Capillary: 116 mg/dL — ABNORMAL HIGH (ref 70–99)

## 2010-07-31 LAB — VITAMIN B12: Vitamin B-12: >2000 pg/mL — ABNORMAL HIGH (ref 211–911)

## 2010-07-31 MED ORDER — IOHEXOL 300 MG/ML  SOLN
100.0000 mL | Freq: Once | INTRAMUSCULAR | Status: AC | PRN
  Administered 2010-07-31: 100 mL via INTRAVENOUS

## 2010-08-01 LAB — BASIC METABOLIC PANEL
Calcium: 8.5 mg/dL (ref 8.4–10.5)
Creatinine, Ser: 1.01 mg/dL (ref 0.50–1.35)
GFR calc Af Amer: >60 mL/min (ref >60)
GFR calc non Af Amer: >60 mL/min (ref >60)
Sodium: 135 mEq/L (ref 135–145)

## 2010-08-01 LAB — CBC
MCH: 22.9 pg — ABNORMAL LOW (ref 26.0–34.0)
MCHC: 32.6 g/dL (ref 30.0–36.0)
MCV: 70.1 fL — ABNORMAL LOW (ref 78.0–100.0)
Platelets: 159 10*3/uL (ref 150–400)
RBC: 4.02 MIL/uL — ABNORMAL LOW (ref 4.22–5.81)
RDW: 17.9 % — ABNORMAL HIGH (ref 11.5–15.5)

## 2010-08-01 LAB — CROSSMATCH
ABO/RH(D): O NEG
Unit division: 0

## 2010-08-01 LAB — GLUCOSE, CAPILLARY: Glucose-Capillary: 123 mg/dL — ABNORMAL HIGH (ref 70–99)

## 2010-08-04 NOTE — H&P (Signed)
[name], [name]               ACCOUNT NO.:  [id number]  MEDICAL RECORD NO.:  [id number]  LOCATION:                                 FACILITY:  PHYSICIAN:  Palmer, M.D.DATE OF BIRTH:  15-Aug-1940  DATE OF ADMISSION:  07/29/2010 DATE OF DISCHARGE:                             HISTORY & PHYSICAL   PRIMARY CARE PHYSICIAN:  Edward Landry A. Everlene Farrier, MD at [facility].  CHIEF COMPLAINT:  GI bleed.  HISTORY OF PRESENT ILLNESS:  The patient is a 70 year old male with a history of diverticular bleed in January 2011, AFib on anticoagulation, PAD s/p right femoral stent on Plavix, status post cholecystectomy, diabetes mellitus, hypertension, hyperlipidemia, presenting with weakness, fever, and blood in his stool.  The patient reports noting blood in his stools 4 weeks ago x2, at which point, he stopped taking his Xarelto and aspirin.  He has noted bright red blood per rectum for the past 3-4 days which he says resolved yesterday.  He has had increasing weakness, fevers, and night sweats during this time over the last 3-4 days.  He has had decreased appetite, but no nausea or vomiting.  He states 14 pounds of weight loss over several months.  He also has had diarrhea as he has had an increasing number of medications and supplements.  He does not report any abdominal pain at this time. He does state that he has had fevers over the course of the last 2 days up to 101.8.  At [facility], he was found to have orthostatic hypotension with a hemoglobin of 9, so he was directly admitted to the [facility] Medicine Teaching Service for further evaluation and management. He was afebrile both at [facility] and on presentation to the [facility] Teaching [facility].  PAST MEDICAL HISTORY: 1. Diabetes mellitus. 2. Atrial fibrillation status post cardioversion on Jun 09, 2010,     currently in sinus rhythm. 3. Peripheral arterial disease status post right common iliac     stenting. 4.  Carotid stenosis status post right carotid endarterectomy in March     2011. 5. Status post cholecystectomy due to cholecystitis in March 2011. 6. Hypertension. 7. Hyperlipidemia. 8. Diverticulosis with history of diverticular bleed.  PAST SURGICAL HISTORY: 1. Right carotid endarterectomy. 2. Status post cholecystectomy. 3. Left knee replacement.  SOCIAL HISTORY:  Lives with his wife.  He is a Chief Executive Officer.  He has a 50 pack- year smoking history, but quit 25 years ago.  Alcohol, drinks 1-2 drinks per week.  No illicit drug use.  FAMILY HISTORY:  Mother and father with a history of CAD and MI.  ALLERGIES:  MORPHINE - itching, LIPITOR - weakness, TRICOR - weakness.  MEDICATIONS: 1. Byetta 10 mcg b.i.d. injection. 2. Coreg 25 mg b.i.d. p.o. 3. Claritin 10 mg daily. 4. Coenzyme Q10, 200 over the counter. 5. Fish oil 1200 mg b.i.d. 6. Flecainide 150 mg b.i.d. p.o. 7. Losartan 100 q.a.m. 8. Hydrochlorothiazide 25 q.a.m. 9. Multivitamin. 10.Metformin 1 g b.i.d. 11.Potassium. 12.B vitamin. 13.Pravachol 80 mg at bedtime. 14.Prilosec 20 mg daily. 15.Xarelto, currently held. 16.Aspirin, currently held. 17.Vitamin D. 18.Plavix 75 mg daily.  REVIEW OF SYSTEMS:  Positive for fevers, sweats,  appetite, decreased fatigue, weight loss, dyspnea moderately along with weakness, diarrhea, ecchymosis from insulin shots and jaundice, weakness.  Negative for chest pain, nausea, dysuria, hematuria, vomiting, bleeding, easy bruising, polyuria, polydipsia, rash, swelling, sore throat.  PHYSICAL EXAMINATION:  VITAL SIGNS:  Temperature 97.9, pulse 70, respirations 20, blood pressure 114/67; orthostatic blood pressure; lying blood pressure 119/66, standing blood pressure 94/53; sating 99% on room air, weight 102 kg. GENERAL:  No acute distress. HEENT:  No scleral icterus. SKIN:  Slightly jaundiced. CARDIOVASCULAR:  2/6 systolic ejection murmur at apex.  Regular rate and rhythm. LUNGS:  Bilateral  crackles, occasional wheeze diffusely. ABDOMEN: Soft, tender in left lower quadrant moderately.  No distention or fluid wave. RECTAL:  Performed at [facility], fecal occult blood test negative, not performed presently. EXTREMITIES:  2+ pulses.  No edema. NEUROLOGIC:  Grossly intact. MUSCULOSKELETAL:  Moves all extremities.  LABS AND STUDIES:  Pending a CBC, CMET, ESR, PT, INR.  ASSESSMENT AND PLAN:  This is a 70 year old male with a history of diverticular bleed in January 2011, atrial fibrillation, peripheral arterial disease, hypertension, hyperlipidemia, presenting with weakness, blood in stool, hemoglobin changed from 12 to 9, and left lower quadrant pain concerning for diverticular bleed versus diverticulitis. 1. Gastrointestinal bleed/pain - the patient with drop in hemoglobin     from 12 to 9 and orthostatic hypotension concerning for     gastrointestinal bleed, although fecal occult blood tests negative     at outside hospital.  The patient also says the bleeding has     stopped.  We will obtain fecal occult blood test x3, recheck a CBC,     and consider more frequent checks.  If hemoglobin drops, obtain     CMET, PT/INR.  We will consider diverticulitis as a more likely     diagnosis if the patient has a fever, white count, or elevated ESR.     We will consider Cipro and Flagyl for treatment.  Consider stopping     Plavix if the patient rebleeds.  We will continue to hold aspirin     and Xarelto.  Differential diagnoses include diverticular bleed,     diverticulitis, angiodysplasia with hemorrhoids, although no     history and not on outside examination, arteriovenous     malformations, polyp/cancer, although negative colonoscopy in 2011,     infectious colitis. 2. Fever and weakness - the patient reports fever at home, but has not     had fever here.  The patient with potential gastrointestinal source     for fever and also lung and bibasilar crackles.  Weakness could be      from infectious source, but most likely related to acute blood     loss.  We will obtain chest x-ray, CBC, and monitor fever curve as     well as follow up on ESR. 3. Jaundice.  We will obtain CMET and further evaluate at that point. 4. Orthostatic hypotension - likely related to gastrointestinal bleed.     We will hydrate with 1 L bolus, then 125 an hour of normal saline.     We will repeat orthostatics. 5. Hypertension.  Reduce Coreg to 12.5 mg b.i.d. Due to current hypotension, we will hold home losartan     and hydrochlorothiazide for now.  Restart as his blood pressure     tolerates. 6. Atrial fibrillation.  Continue flecainide.  The patient currently     in normal sinus rhythm.  Continue telemetry. 7. Peripheral arterial disease/hyperlipidemia/status post  stents -     continue Plavix and Pravachol for now.  Start Plavix if the patient     rebleeds or fecal occult blood test positive.  Continue coenzyme     Q10. 8. Diabetes.  Place the patient on sliding scale insulin and hold home     metformin and Byetta. 9. Fluid, electrolytes, nutrition - the patient currently on liquid     clears - may advance if hemoglobin is stable overnight.  One liter     bolus and continue the patient on 125 L per hour of normal saline     after bolus.  Follow up electrolytes. 10.Prophylaxis.  The patient on SCDs. 11.The patient is full code.    ______________________________ Garret Reddish, MD   ______________________________ Edward [name], M.D.    SH/MEDQ  D:  07/29/2010  T:  07/30/2010  Job:  [id number]  Electronically Signed by Garret Reddish MD on 07/31/2010 09:00:40 PM Electronically Signed by Edward [name] M.D. on 08/04/2010 04:51:28 PM

## 2010-08-14 NOTE — Discharge Summary (Signed)
NAMEJAYDIS, Edward [name] NO.:  [id number]  MEDICAL RECORD NO.:  [id number]  LOCATION:                                 FACILITY:  PHYSICIAN:  Dickie La, MD        DATE OF BIRTH:  [birth year]  DATE OF ADMISSION:  07/29/2010 DATE OF DISCHARGE:  08/01/2010                              DISCHARGE SUMMARY   PRIMARY CARE PHYSICIAN:  Dr. Everlene Farrier at [facility].  CONSULTANTS:  Jeryl Columbia, MD, of Gastroenterology.  DISCHARGE DIAGNOSES:   Primary: 1. Lower gastrointestinal bleed. Secondary: 1. Diabetes mellitus. 2. Atrial fibrillation status post cardioversion on Jun 09, 2010,     currently in sinus rhythm. 3. Peripheral arterial disease status post right common iliac     stenting. 4. Carotid stenosis status post right carotid endarterectomy in March     2011. 5. Status post cholecystectomy due to cholecystitis in March 2011. 6. Hypertension. 7. Hyperlipidemia. 8. Diverticulosis with history of diverticular bleed in December 2011.  DISCHARGE MEDICATIONS:    New medication; 1. Ferrous sulfate 325 mg by mouth 3 times a day between meals for 30     days. Continued medications; 1. Byetta 10 mcg 1 injection subcutaneously twice daily. 2. Calcium 1200 mg/vitamin D3 1000 mg over-the-counter 1 tablet by     mouth daily. 3. Carvedilol 25 mg 1 tablet by mouth twice daily. 4. Claritin 10 mg 1 tablet by mouth every evening. 5. Coenzyme Q10 200 mg over-the-counter 1 tablet by mouth every     morning. 6. Fish oil 1200 mg 1-2 capsules by mouth twice daily. 7. Flecainide 150 mg 1 tablet by mouth twice daily. 8. Four-Way over-the-counter 1 spray nasally daily as needed. 9. Hydrocortisone topical cream for rash on legs 1 application     topically daily as needed. 10.Klor-Con 20 mEq 1 tablet by mouth every evening. 11.Lasix 40 mg 1 tablet by mouth every morning. 12.Losartan 100 mg 1 tablet by mouth every evening. 13.Magnesium 500 mg 1 tablet by mouth twice  daily. 14.Metanx 1 tablet by mouth every morning. 15.Multivitamins 1 tablet by mouth every morning. 16.Metformin 1000 mg 1 tablet by mouth twice daily. 17.Pravastatin 80 mg 1 tablet by mouth every evening. 18.Prilosec 20 mg 1 tablet by mouth daily. 19.The patient is instructed to restart aspirin 81 mg in 2-3 days.  HOSPITAL COURSE:  The patient is a 70 year old male with a history of diverticular bleed, PAD, on antiplatelet therapy, history of atrial fibrillation on anticoagulation presenting with lower GI bleed. 1. GI bleed.  The patient was admitted as a direct admit from [facility].  He was found to have a hemoglobin of 8 down from a     baseline of 12-13.  The patient was given 2 units of blood over the     course of 2 days and was discharged with a hemoglobin of 9.2.  The     patient also had orthostatic hypotension due to blood loss, so he     was hydrated with a liter of bolus and maintenance IV fluid with     resolution of orthostatic hypotension.  The  patient had experienced     weakness with a GI bleed which improved with blood and hydration.     GI was consulted and performed both a colonoscopy and EGD which did     not show any acute bleed.  There was an area of inflammation in the     colon which was attributed to potential ischemic colitis with a     source of bleed.  The patient also had multiple diverticuli, was     not actively bleeding (please see procedures for full report),     although this was a potential source.  The patient's aspirin,     Xarelto, and Plavix were held.  Anticoagulation and antiplatelet     therapy were discussed with primary cardiologist of the patient Dr.     Liam Rogers and Dr. Esmeralda Links, his vascular cardiologist.  They     recommended discontinuing Xarelto and Plavix. They suggested     restarting aspirin in 2-3 days and these were the instructions     given to the patient. 2. Hypertension.  The patient's blood pressure  medications were     initially held due to orthostatic hypotension and they were     gradually restarted as his orthostatic hypotension resolved. 3. Orthostatic hypotension.  The patient initially with orthostatic     hypotension which resolved with 2 units of blood, 1 L of IV fluids     and maintenance IV fluids. 4. Microcytic anemia.  The patient was found to have an iron level of     less than 10.  He was started on oral iron.  He will need a repeat     hemoglobin in several weeks and possibly a reticulocyte percentage.     This was thought to be due to a chronic lower GI bleed as the     patient had complained of GI bleeding a month before his most     recent bleed 3-5 days. 5. Fever history.  The patient reported a fever of 101.8 at home, but     this did not reoccur in the hospital and the patient did not have a     white count. 6. Concern of a pulmonary nodule on chest x-ray.  The patient had a CT     scan of the chest to investigate this potential nodule and this     nodule was not apparent on the CT, so is most likely there was an     artifact on chest x-ray. 7. Peripheral arterial disease, hyperlipidemia, status post stent.    The patient was continued on his home statin.  His Plavix and     Xarelto were held as well as his aspirin.  His aspirin is to be     restarted in 2-3 days after discharge. 8. Atrial fibrillation.  The patient was continued on home flecainide.     He was monitored on telemetry.  He remained in normal sinus rhythm     throughout his hospitalization. 9. Question of jaundice - the patient had normal LFTs and jaundice was     not apparent on exam.  No further investigation was deemed     necessary. 10.Diabetes mellitus.  The patient was placed on sliding scale insulin     and his blood glucoses were moderately well controlled throughout     the stay. 11.Hypokalemia.  The patient with a potassium of 3.4 before discharge.     The patient was told to  take 2  of his normal 20 mEq of potassium     chloride at home.  PROCEDURES:  CT of the chest, abdomen and pelvis with contrast on August 01, 2010; 1. No CT findings to correlate with a plain film abnormality of a pulmonary     nodule, likely artifactual. 2. Development of a small bilateral pleural effusion with persistent     small pericardial effusion.  Question of component of fluid     overload/congestive heart failure. 3. Development of thoracic adenopathy, mild.  This also could be     secondary to fluid overload.  Concurrent borderline left     supraclavicular adenopathy.  Consider followup chest CT at 3 months     to confirm resolution of this appearance. 4. Multivessel coronary artery atherosclerosis. 5. Right-sided colitis.  Distribution favors infection.  Given history     of GI bleeding in an extent of atherosclerosis, ischemia should be     considered on this (typically centered in the splenic     flexure/proximal descending colon). 6. Hepatomegaly.  EKG on July 31, 2010. 1. Small hiatal hernia. 2. Few antral nodules, probably erosion status post biopsy. 3. Otherwise within normal limits of the second or third part of the     duodenum without any blood seen.  Colonoscopy 1. Internal and external hemorrhoids. 2. Left greater than right diverticula throughout the whole colon,     however no blood seen. 3. Small distal sigmoid polyp, cold biopsied. 4. Mild right proximal transverse to the probable hepatic flexure mild     colitis, probably ischemic status post biopsy. 5. Otherwise within normal limits to the cecum.  LABS AT DISCHARGE:  BMP; 135, 3.4, 101, 26, 11, 109.  CBC; white count of 6.9, hemoglobin of 9.2, MCV of 70.1, RDW 17.9, platelet count 159. Anemia panel with iron of less than 10, vitamin B12 greater than 2000, folate greater than 20.  Reticulocyte count 2.4%.  CONDITION AT TIME OF DISCHARGE:  Discharge home in stable condition.  DISPOSITION:   Home.  DISCHARGE FOLLOWUP:  The patient will call to schedule a followup appointment with Dr. Everlene Farrier at [facility] and Dr. Watt Climes with GI.  FOLLOWUP ISSUES: 1. Please consider repeat hemoglobin and monitor for signs of repeat     bleeding. 2. Please consider followup for radiologic findings of adenopathy.     This would include a CT of the chest in approximately 3 months to     monitor for resolution. 3. Please consider followup with cardiologist for anticoagulation     needs.  The patient currently only discharged on aspirin.    ______________________________ Garret Reddish, MD   ______________________________ Dickie La, MD   SH/MEDQ  D:  08/01/2010  T:  08/02/2010  Job:  [id number]  cc:   Richardson Landry A. Everlene Farrier, M.D. Jeryl Columbia, M.D.  Electronically Signed by Garret Reddish MD on 08/06/2010 11:35:07 PM Electronically Signed by Dorcas Mcmurray MD on 08/14/2010 10:42:37 AM

## 2010-08-22 NOTE — Op Note (Signed)
  Edward [name], ALPERIN NO.:  [id number]  MEDICAL RECORD NO.:  [id number]  LOCATION:                                 FACILITY:  PHYSICIAN:  Jeryl Columbia, M.D.    DATE OF BIRTH:  07/02/1940  DATE OF PROCEDURE:  07/31/2010 DATE OF DISCHARGE:                              OPERATIVE REPORT   PROCEDURE:  EGD with biopsy.  INDICATIONS:  The patient with upper tract symptoms, microcytic anemia, recent GI bleed, nondiagnostic colonoscopy.  Consent was signed after risks, benefits, methods, options thoroughly discussed multiple times during the hospitalization and before any sedation.  ADDITIONAL MEDICINES FOR THIS PROCEDURE:  Fentanyl 25 mcg, Versed 3 mg.  PROCEDURE:  The video endoscope was inserted by direct vision.  He did have a small hiatal hernia.  Scope passed into the stomach, advanced to the antrum where a few tiny antral nodules were seen and were biopsied at the end of the procedure.  These were probably minimal raised erosions.  Scope passed through a normal pylorus into a normal duodenal bulb and around the C-loop to a normal second and possibly even third part of the duodenum.  No blood was seen distally.  Scope was withdrawn back to the bulb and a good look there ruled out abnormalities in that location.  Scope was withdrawn back to the stomach and retroflexed. Cardia, fundus, angularis, lesser and greater curve were normal on retroflex and straight visualization.  We then went ahead and took biopsies of the antrum as above.  Air was suctioned.  Scope slowly withdrawn again and good look at the esophagus was normal except for small hiatal hernia.  Scope was removed.  The patient tolerated the procedure well.  There was no obvious immediate complication.  ENDOSCOPIC DIAGNOSES: 1. Small hiatal hernia. 2. Few antral nodules probably erosion status post biopsy. 3. Otherwise within normal limits to the second or third part of the     duodenum without  any blood being seen.  PLAN:  Continue Prilosec, slowly advance diet.  Okay to resume blood thinners.  Consider CT next for anemia workup and particularly if his left lower quadrant pain continues, would like to see back in 2-4 weeks to recheck symptoms, guaiac, CBC, and make sure no further workup plans are needed.         ______________________________ Jeryl Columbia, M.D.    MEM/MEDQ  D:  07/31/2010  T:  07/31/2010  Job:  [id number]  cc:   Richardson Landry A. Everlene Farrier, M.D.  Electronically Signed by Clarene Essex M.D. on 08/22/2010 04:33:01 PM

## 2010-08-22 NOTE — Consult Note (Signed)
NAMEVITALIY, EISENHOUR NO.:  [id number]  MEDICAL RECORD NO.:  [id number]  LOCATION:                                 FACILITY:  PHYSICIAN:  Jeryl Columbia, M.D.    DATE OF BIRTH:  1940-04-16  DATE OF CONSULTATION: DATE OF DISCHARGE:                                CONSULTATION   HISTORY:  The patient is seen at the request of Dr. Everlene Farrier for lower GI bleeding.  He has had this off and on for the last 5 days.  He says he actually was sicker yesterday with increased left lower quadrant pain, low grade fever and increased fatigue and weakness.  He actually feels much better than he has been.  He had been on Plavix, a new blood thinner, and aspirin but when he started seeing the blood about 3-4 days ago, he stopped the new blood thinner and the aspirin but he believes the blood has now stopped.  It has all been bright red and he has not had any other GI problem.  He did have a recent cardioversion as well as a right femoral stent placed in May.  He had a similar episode in January 2011 where he had a colonoscopy and probable diverticular bleeding which stopped on its own.  He is currently hungry and wants to eat.  His past medical history is pertinent for diabetes, AFib, common iliac stenting, carotid stenosis with a carotid endarterectomy, history of cholecystectomy, hypertension, increased cholesterol, left knee replacement and diverticulosis.  Allergies are to MORPHINE, LIPITOR and TRICOR.  Medicines at home include recently amlodipine, Pipera, Coreg, Claritin, Coenzyme Q, fish oil, flecainide, losartan, HCTZ, multiple vitamins, metformin, potassium, B vitamins, Pravachol, Prilosec with his blood thinner and  aspirin on hold, vitamin D and Plavix.  FAMILY HISTORY:  Negative for any obvious GI problems.  SOCIAL HISTORY:  Quit tobacco, occasionally drinks.  REVIEW OF SYSTEMS:  Pertinent for him feeling better currently.  PHYSICAL EXAMINATION:  GENERAL:  No  acute distress. VITAL SIGNS:  Stable, afebrile currently.  He was orthostatic at Dr. Perfecto Kingdom office. ABDOMEN:  Soft and not getting any tenderness, very minimal left lower quadrant on very deep palpation he says but good bowel sounds.  LABORATORY FINDINGS:  Pertinent for a sed rate of 40, BUN of 17 with a creatinine of 1.3.  His liver tests were normal and an albumin was 2.9. His PT was 15.4, hemoglobin was 8.0, hematocrit 25, MCV 69, it had been 73 within normal platelet counts.  ASSESSMENT: 1. Multiple medical problems. 2. Lower gastrointestinal bleeding on multiple blood thinners,     although on Plavix stopped a few days ago, probably diverticular.  PLAN:  Agree with clear liquids, follow H and H, transfuse as needed. Hold Plavix if okay with Cardiology and we will reevaluate in the morning, but probably proceed with a repeat colonoscopy just to be sure. We will follow with you.          ______________________________ Jeryl Columbia, M.D.     MEM/MEDQ  D:  07/29/2010  T:  07/30/2010  Job:  [id number]  cc:   [name], M.D. Juanda Bond. Burt Knack, MD  John C. Amedeo Plenty, M.D. Lina Sayre Everlene Farrier, M.D.  Electronically Signed by Clarene Essex M.D. on 08/22/2010 04:33:05 PM

## 2010-08-22 NOTE — Op Note (Signed)
  [name], Edward [name]               ACCOUNT NO.:  [id number]  MEDICAL RECORD NO.:  [id number]  LOCATION:                                 FACILITY:  PHYSICIAN:  Jeryl Columbia, M.D.    DATE OF BIRTH:  1940/10/04  DATE OF PROCEDURE:  07/31/2010 DATE OF DISCHARGE:                              OPERATIVE REPORT   COLONOSCOPY WITH BIOPSY  PROCEDURE:  Colonoscopy.  INDICATION:  Microcytic anemia, lower GI bleeding with lower quadrant abdominal pain.  Consent was signed after risks, benefits, methods, options thoroughly discussed multiple times during the hospital stay.  MEDICINES USED:  Fentanyl 75 mcg, Versed 6 mg.  PROCEDURE:  Rectal inspection was pertinent for external hemorrhoids, small.  Digital exam was negative.  The video colonoscope was inserted and easily advanced around the colon to the cecum.  No position changes or abdominal pressure was needed.  No signs of active bleeding or old blood was seen.  The cecum was identified by the appendiceal orifice and ileocecal valve.  On insertion, some left greater than right diverticula were seen but they were throughout the whole colon.  In the hepatic flexure and distal transverse was some very minimal colitis which was cold biopsied on withdrawal.  Due to looping and pain, we were unable to enter the ileocecal valve, so we elected to withdraw.  The ascending colon was normal.  The hepatic flexure and proximal transverse had a mild colitis mentioned above and the biopsies were obtained at this time, it was very mild.  The scope was further withdrawn.  Other than the diverticula, no other abnormalities were seen until the distal sigmoid, a tiny and small polyp was seen and was cold biopsied x3 and put in a separate container.  Once back in the rectum, anorectal pull- through and retroflexion confirmed some small hemorrhoids.  Scope was straightened, readvanced up the left side of the colon, air was suctioned, scope removed.  The  patient tolerated the procedure well. There was no obvious immediate complication.  ENDOSCOPIC DIAGNOSES: 1. Internal and external hemorrhoids. 2. Left greater than right diverticula throughout the whole colon,     however. 3. No blood seen. 4. Small distal sigmoid polyp, cold biopsied. 5. Mild right proximal transverse to the probable hepatic flexure mild     colitis, probably ischemic status post     biopsy. 6. Otherwise, within normal limits to the cecum.  PLAN:  Await pathology.  Continue workup with an EGD.  If pain continues, consider a CT scan next.          ______________________________ Jeryl Columbia, M.D.     MEM/MEDQ  D:  07/31/2010  T:  07/31/2010  Job:  [id number]  cc:   Richardson Landry A. Everlene Farrier, M.D.  Electronically Signed by Clarene Essex M.D. on 08/22/2010 04:33:09 PM

## 2010-11-01 ENCOUNTER — Emergency Department (HOSPITAL_COMMUNITY)
Admission: EM | Admit: 2010-11-01 | Discharge: 2010-11-01 | Disposition: A | Discharge status: Home / Self Care | Payer: Federal, State, Local not specified - PPO | Attending: Emergency Medicine | Admitting: Emergency Medicine

## 2010-11-01 DIAGNOSIS — E119 Type 2 diabetes mellitus without complications: Secondary | ICD-10-CM | POA: Insufficient documentation

## 2010-11-01 DIAGNOSIS — I10 Essential (primary) hypertension: Secondary | ICD-10-CM | POA: Insufficient documentation

## 2010-11-01 DIAGNOSIS — K922 Gastrointestinal hemorrhage, unspecified: Secondary | ICD-10-CM | POA: Insufficient documentation

## 2010-11-01 DIAGNOSIS — I4891 Unspecified atrial fibrillation: Secondary | ICD-10-CM | POA: Insufficient documentation

## 2010-11-01 DIAGNOSIS — F29 Unspecified psychosis not due to a substance or known physiological condition: Secondary | ICD-10-CM | POA: Insufficient documentation

## 2010-11-01 DIAGNOSIS — R109 Unspecified abdominal pain: Secondary | ICD-10-CM | POA: Insufficient documentation

## 2010-11-01 DIAGNOSIS — R197 Diarrhea, unspecified: Secondary | ICD-10-CM | POA: Insufficient documentation

## 2010-11-01 LAB — DIFFERENTIAL
Basophils Relative: 0 % (ref 0–1)
Eosinophils Absolute: 0.2 10*3/uL (ref 0.0–0.7)
Eosinophils Relative: 2 % (ref 0–5)
Lymphs Abs: 1.2 10*3/uL (ref 0.7–4.0)
Neutrophils Relative %: 78 % — ABNORMAL HIGH (ref 43–77)

## 2010-11-01 LAB — COMPREHENSIVE METABOLIC PANEL
Alkaline Phosphatase: 76 U/L (ref 39–117)
BUN: 16 mg/dL (ref 6–23)
Chloride: 100 mEq/L (ref 96–112)
Creatinine, Ser: 1.25 mg/dL (ref 0.50–1.35)
GFR calc Af Amer: 67 mL/min — ABNORMAL LOW (ref >90)
Glucose, Bld: 166 mg/dL — ABNORMAL HIGH (ref 70–99)
Potassium: 4.2 mEq/L (ref 3.5–5.1)
Total Bilirubin: 0.6 mg/dL (ref 0.3–1.2)
Total Protein: 6.1 g/dL (ref 6.0–8.3)

## 2010-11-01 LAB — SAMPLE TO BLOOD BANK

## 2010-11-01 LAB — CBC
MCV: 76.5 fL — ABNORMAL LOW (ref 78.0–100.0)
Platelets: 182 10*3/uL (ref 150–400)
RBC: 4.69 MIL/uL (ref 4.22–5.81)
RDW: 17.6 % — ABNORMAL HIGH (ref 11.5–15.5)
WBC: 9.8 10*3/uL (ref 4.0–10.5)

## 2010-11-01 LAB — PROTIME-INR
INR: 1.09 (ref 0.00–1.49)
Prothrombin Time: 14.3 seconds (ref 11.6–15.2)

## 2010-11-08 ENCOUNTER — Telehealth: Payer: Self-pay | Admitting: Cardiovascular Disease

## 2010-11-08 NOTE — Telephone Encounter (Signed)
Agree that HCTZ should be decreased if BP is low.  Dr. Watt Climes may not want that responsibilty.  We can do that.  Have her send [place] a week of BP readings

## 2010-11-08 NOTE — Telephone Encounter (Signed)
Updated me, pt had TIA a week ago. hgb 9.6 due to diverticulitis/ bleed. Consulting/managed by Dr Cheryln Manly. This am his BP 99/66 standing, 102/70 sitting, c/o weakness, dizziness. Pt went to Dr Cheryln Manly office and had labs drawn. Told wife they should ask Dr Cheryln Manly to adjust BP meds or HCTZ if he feels its necessary. They will contact us if needs OV here with [place] for medication adjustment.

## 2010-11-08 NOTE — Telephone Encounter (Signed)
Pt wife calling wanting to speak with Jodette regarding pt BP. Pt wife said it was urgent and to please call back asap.

## 2010-11-09 NOTE — Telephone Encounter (Signed)
It was recommended that he stop his HCTZ was blood pressure was very low. Instead he stopped his losartan and now his blood pressure is a little on the high side.  He would be fine with me if he wants to try discontinuing the HCTZ instead of the losartan if he thinks he'll feel better. At present his blood pressure is a little on the high side but has not so high but I'm concerned.  The real concern is that he is back in atrial ablation but has had a GI bleed which prohibits [place] restarting anticoagulation. I'll see him next week.

## 2010-11-09 NOTE — Telephone Encounter (Signed)
Is it ok that pt stopped the Losartan instead of the HCTZ that he was told to stop?  Wife is concerned about his BP being 147/90 and wanted to make sure that Dr Acie Fredrickson was ok with this reading.

## 2010-11-09 NOTE — Telephone Encounter (Signed)
Left message

## 2010-11-09 NOTE — Telephone Encounter (Signed)
Noted  

## 2010-11-09 NOTE — Telephone Encounter (Signed)
Spoke with wife and patients blood pressure down to 505'X systolic now.  She is going to continue to leave his Losartan off.  She doesn't want to discontinue his HCTZ because he will swell up.  They are going out of town this weekend and he will take all of his medications, including Losartan with him. Did schedule appointment next week to be evaluated by Dr Acie Fredrickson

## 2010-11-09 NOTE — Telephone Encounter (Signed)
I have received notice that he is in A-fib.  He recently had a GI bleed with evidence of a diverticular bleed.  We will not be able to start anticoagulation at this point. I'll be happy to see him in the office. We'll need some advice from Dr.  Watt Climes regarding when we could start him on Coumadin or pradaxa

## 2010-11-09 NOTE — Telephone Encounter (Signed)
Pt calling stating that w/o BP medicine pt BP this am is 143/87 HR 86. Also pt is in a-fib. Please return pt call to discuss further.

## 2010-11-09 NOTE — Telephone Encounter (Signed)
Pt has returned your call.  Please call back

## 2010-11-09 NOTE — Telephone Encounter (Signed)
Edward [name] states that Edward [name] has been in a slow afib since last Wed.  He stopped the Losartan instead of the HCTZ  because he didn't want to stop his diuretic.  BP this am was 147/90.

## 2010-11-15 ENCOUNTER — Telehealth: Payer: Self-pay | Admitting: *Deleted

## 2010-11-15 ENCOUNTER — Ambulatory Visit (INDEPENDENT_AMBULATORY_CARE_PROVIDER_SITE_OTHER): Payer: Federal, State, Local not specified - PPO | Admitting: Cardiovascular Disease

## 2010-11-15 ENCOUNTER — Encounter: Payer: Self-pay | Admitting: Cardiovascular Disease

## 2010-11-15 DIAGNOSIS — I4891 Unspecified atrial fibrillation: Secondary | ICD-10-CM

## 2010-11-15 DIAGNOSIS — I447 Left bundle-branch block, unspecified: Secondary | ICD-10-CM | POA: Insufficient documentation

## 2010-11-15 DIAGNOSIS — D649 Anemia, unspecified: Secondary | ICD-10-CM

## 2010-11-15 DIAGNOSIS — I2510 Atherosclerotic heart disease of native coronary artery without angina pectoris: Secondary | ICD-10-CM

## 2010-11-15 LAB — HEPATIC FUNCTION PANEL
ALT: 18 U/L (ref 0–53)
AST: 22 U/L (ref 0–37)
Albumin: 3.4 g/dL — ABNORMAL LOW (ref 3.5–5.2)
Alkaline Phosphatase: 72 U/L (ref 39–117)
Total Protein: 6.6 g/dL (ref 6.0–8.3)

## 2010-11-15 LAB — CBC WITH DIFFERENTIAL/PLATELET
Basophils Relative: 0.6 % (ref 0.0–3.0)
Eosinophils Relative: 3.5 % (ref 0.0–5.0)
Monocytes Relative: 8.5 % (ref 3.0–12.0)
Neutrophils Relative %: 76.3 % (ref 43.0–77.0)
Platelets: 269 10*3/uL (ref 150.0–400.0)
RBC: 3.69 Mil/uL — ABNORMAL LOW (ref 4.22–5.81)
WBC: 9.4 10*3/uL (ref 4.5–10.5)

## 2010-11-15 LAB — BASIC METABOLIC PANEL
CO2: 24 mEq/L (ref 19–32)
Chloride: 102 mEq/L (ref 96–112)
GFR: 58.27 mL/min — ABNORMAL LOW (ref >60.00)
Glucose, Bld: 151 mg/dL — ABNORMAL HIGH (ref 70–99)
Potassium: 4.2 mEq/L (ref 3.5–5.1)
Sodium: 137 mEq/L (ref 135–145)

## 2010-11-15 NOTE — Patient Instructions (Signed)
Your physician wants you to follow-up in: 1 month You will receive a reminder letter in the mail two months in advance. If you don't receive a letter, please call our office to schedule the follow-up appointment.  Your physician has requested that you have an adenosine myoview. For further information please visit [email or web address]. Please follow instruction sheet, as given.  On return visit you will need an EKG and CBC  Your physician recommends that you have labs today cbc/ bmp/ hepatic function

## 2010-11-15 NOTE — Telephone Encounter (Signed)
Noted  

## 2010-11-15 NOTE — Assessment & Plan Note (Addendum)
Edward [name] is back in atrial fibrillation. His rate is fairly well controlled. He is not very symptomatic at this time.  We've had to discontinue the Xarelto because of his recurrent diverticular bleeds.  We'll continue to follow him for now. At this point is not a good candidate for anticoagulation and therefore not a candidate for cardioversion.  His rate is well controlled and he seems to be fairly comfortable.

## 2010-11-15 NOTE — Assessment & Plan Note (Signed)
He appears to be pale today his hemoglobin was 9.6 several weeks ago. We'll recheck his hemoglobin today.

## 2010-11-15 NOTE — Telephone Encounter (Signed)
Spoke with wife and she said his HGB is now better, last week after bleed it was 8.6 so she was thrilled to hear it was 9.9 today, labs will be forwarded to Dr Watt Climes,

## 2010-11-15 NOTE — Progress Notes (Signed)
Edward [name] Date of Birth  Apr 02, 1940 [facility] [address]    [place], [place]  [address] [phone]  Fax  [phone]  History of Present Illness:  Edward [name] is a 70 year old gentleman with a history of atrial fibrillation, hypertension, diabetes mellitus, dyslipidemia, peripheral vascular disease-status post right carotid endarterectomy and status post peripheral PTA by Dr. Burt Knack. He also has had several episodes of GI bleeds presumably due to diverticulosis. He also had a TIA several weeks ago.  He was recently hospital for a GI bleed and mental status changes.  We stopped his Xarelto and Plavix at that time.  His mental status changes resolved and presumably were due to anemia and some transient hypotension.  He's not had any episodes of chest pain. He does note that he may have some shortness of breath with exertion. He's fairly comfortable right now.    Current Outpatient Prescriptions on File Prior to Visit  Medication Sig Dispense Refill  . Calcium Carbonate-Vitamin D (CALCIUM + D PO) Take 1,200 mg by mouth daily.        . carvedilol (COREG) 25 MG tablet Take 25 mg by mouth 2 (two) times daily.       . Coenzyme Q10 (COQ10) 200 MG CAPS Take 1 capsule by mouth daily.        Marland Kitchen exenatide (BYETTA 10 MCG PEN) 10 MCG/0.04ML SOLN Inject into the skin 2 (two) times daily with a meal. Taking 41m  1.2 mL    . flecainide (TAMBOCOR) 100 MG tablet Take 1.5 tablets (150 mg total) by mouth 2 (two) times daily.  90 tablet  12  . hydrochlorothiazide 25 MG tablet Take 25 mg by mouth daily.        .Marland KitchenL-Methylfolate-B6-B12 (METANX PO) Take by mouth daily.        .Marland Kitchenloratadine-pseudoephedrine (CLARITIN-D 24-HOUR) 10-240 MG per 24 hr tablet Take 1 tablet by mouth daily.        .Marland Kitchenlosartan (COZAAR) 100 MG tablet Take 50 mg by mouth daily.       . metFORMIN (GLUCOPHAGE) 1000 MG tablet Take 1,000 mg by mouth daily.       . Multiple Vitamin (MULTIVITAMIN) tablet Take 1 tablet by mouth  daily.        .Marland Kitchenomeprazole (PRILOSEC OTC) 20 MG tablet Take 20 mg by mouth daily.        . potassium chloride SA (K-DUR,KLOR-CON) 20 MEQ tablet Take 20 mEq by mouth daily.        . pravastatin (PRAVACHOL) 80 MG tablet Take 1 tablet (80 mg total) by mouth daily.  90 tablet  3    Allergies  Allergen Reactions  . Lipitor (Atorvastatin Calcium)   . Morphine And Related Itching    Past Medical History  Diagnosis Date  . A-fib   . HTN (hypertension)   . Diabetes mellitus   . Dyslipidemia   . PVD (peripheral vascular disease)     s/p Right CEA March, 2011,   . Claudication     Past Surgical History  Procedure Date  . Carotid endarterectomy     RIGHT  . Cholecystectomy   . Tonsillectomy and adenoidectomy   . Knee surgery     History  Smoking status  . Never Smoker   Smokeless tobacco  . Not on file    History  Alcohol Use  . Yes    OCC.    Family History  Problem Relation Age of Onset  .  Coronary artery disease Father   . Hypertension Father   . Heart attack Mother   . Coronary artery disease Mother   . Hypertension Sister     Reviw of Systems:  Reviewed in the HPI.  All other systems are negative.  Physical Exam: BP 121/81  Pulse 98  Ht 5' 10"  (1.778 m)  Wt 230 lb (104.327 kg)  BMI 33.00 kg/m2 The patient is alert and oriented x 3.  The mood and affect are normal.   Skin: warm and dry.  Color is slightly  pale   HEENT:   [place]/ AT, normal carotids, jo JVD  Lungs: clear   Heart: Irreg Irreg, no murmurs    Abdomen: obese, nt. Good BS, no rebound  Extremities:  No edema  Neuro:  Non focal    ECG: H. fibrillation. He has a left bundle branch block. Both of these are new from his previous tracings. Assessment / Plan:

## 2010-11-15 NOTE — Assessment & Plan Note (Addendum)
Edward [name] has developed a left bundle branch block since his last EKG. He denies any episodes of chest pain. We will get a Adenosine Myoview study for further evaluation. He had a [place] Myoview study in March of last year which was unremarkable.   He has peripheral vascular disease and I would have a low threshold to do a cardiac catheterization if this shows any significant abnormalities.

## 2010-11-20 ENCOUNTER — Ambulatory Visit (HOSPITAL_COMMUNITY): Payer: Federal, State, Local not specified - PPO | Attending: Cardiology | Admitting: Radiology

## 2010-11-20 ENCOUNTER — Encounter (HOSPITAL_COMMUNITY): Payer: Self-pay | Admitting: Cardiovascular Disease

## 2010-11-20 DIAGNOSIS — I4949 Other premature depolarization: Secondary | ICD-10-CM

## 2010-11-20 DIAGNOSIS — R0601 Orthopnea: Secondary | ICD-10-CM

## 2010-11-20 DIAGNOSIS — I4891 Unspecified atrial fibrillation: Secondary | ICD-10-CM

## 2010-11-20 DIAGNOSIS — I731 Thromboangiitis obliterans [Buerger's disease]: Secondary | ICD-10-CM

## 2010-11-20 DIAGNOSIS — I447 Left bundle-branch block, unspecified: Secondary | ICD-10-CM | POA: Insufficient documentation

## 2010-11-20 MED ORDER — ADENOSINE (DIAGNOSTIC) 3 MG/ML IV SOLN
0.5600 mg/kg | Freq: Once | INTRAVENOUS | Status: AC
  Administered 2010-11-20: 57.3 mg via INTRAVENOUS

## 2010-11-20 MED ORDER — TECHNETIUM TC 99M TETROFOSMIN IV KIT
11.0000 | PACK | Freq: Once | INTRAVENOUS | Status: AC | PRN
  Administered 2010-11-20: 11 via INTRAVENOUS

## 2010-11-20 MED ORDER — TECHNETIUM TC 99M TETROFOSMIN IV KIT
33.0000 | PACK | Freq: Once | INTRAVENOUS | Status: AC | PRN
  Administered 2010-11-20: 33 via INTRAVENOUS

## 2010-11-20 NOTE — Progress Notes (Signed)
[place] [address] [phone]  Cardiology Nuclear Med Study  Edward [name] is a 70 y.o. male [id number] Dec 11, 1940   Nuclear Med Background Indication for Stress Test:  Evaluation for Ischemia and Abnormal EKG-New LBBB History:  '11 GTX:MIWOEH, EF=74%; 5/12 Echo:EF=65%; 5/12 Cardioversion; H/O Atrial Fibrillation Cardiac Risk Factors: Carotid Disease, Claudication, Family History - CAD, History of Smoking, Hypertension, LBBB, Lipids, NIDDM, Obesity, PVD and TIA  Symptoms:  DOE   Nuclear Pre-Procedure Caffeine/Decaff Intake:  None NPO After: 9:00pm   Lungs:  Clear.  O2 SAT 98% on RA IV 0.9% NS with Angio Cath:  20g  IV Site: R Antecubital  IV Started by:  Eliezer Lofts, EMT-P  Chest Size (in):  50 Cup Size: n/a  Height: 5' 10"  (1.778 m)  Weight:  225 lb (102.059 kg)  BMI:  Body mass index is 32.28 kg/(m^2). Tech Comments:  CBG= 104 @ 7:30 am, Coreg held this am, per patient.    Nuclear Med Study 1 or 2 day study: 1 day  Stress Test Type:  Adenosine  Reading MD: Kirk Ruths, MD  Order Authorizing Provider:  Mertie Moores, MD  Resting Radionuclide: Technetium 15mTetrofosmin  Resting Radionuclide Dose: 11 mCi   Stress Radionuclide:  Technetium 95metrofosmin  Stress Radionuclide Dose: 33 mCi           Stress Protocol Rest HR: 99 Stress HR: 105  Rest BP: 143/88 Stress BP: 164/104  Exercise Time (min): n/a METS: n/a   Predicted Max HR: 152 bpm % Max HR: 69.08 bpm Rate Pressure Product: 17220   Dose of Adenosine (mg):  57.3 Dose of Lexiscan: n/a mg  Dose of Atropine (mg): n/a Dose of Dobutamine: n/a mcg/kg/min (at max HR)  Stress Test Technologist: ShLetta MoynahanCMA-N  Nuclear Technologist:  StCharlton AmorCNMT     Rest Procedure:  Myocardial perfusion imaging was performed at rest 45 minutes following the intravenous administration of Technetium 9949mtrofosmin.  Rest ECG: Atrial Fibrilliation  and rare PVC.  Stress Procedure:  The patient received IV adenosine at 140 mcg/kg/min for 4 minutes.  There was a 2-second pause with infusion.  He did c/o chest pressure with infusion.  Technetium 20m72mrofosmin was injected at the 2 minute mark and quantitative spect images were obtained after a 45 minute delay.  Stress ECG: No significant ST segment change suggestive of ischemia.  QPS Raw Data Images:  Acquisition technically good; mild LVE. Stress Images:  There is decreased uptake in the apex. Rest Images:  There is decreased uptake in the apex. Subtraction (SDS):  No evidence of ischemia. Transient Ischemic Dilatation (Normal <1.22):  1.19 Lung/Heart Ratio (Normal <0.45):  .44  Quantitative Gated Spect Images QGS EDV:  124 ml QGS ESV:  64 ml QGS cine images:  Mild apical hypokinesis; LV function appears better than calculated EF; suggest echo to more fully quantitate. QGS EF: 48%  Impression Exercise Capacity:  Adenosine study with no exercise. BP Response:  Normal blood pressure response. Clinical Symptoms:  No chest pain. ECG Impression:  No significant ST segment change suggestive of ischemia; patient in atrial fibrillation during the study. Comparison with Prior Nuclear Study: No images to compare  Overall Impression:  Low risk stress nuclear study with a small, fixed apical defect suggestive of thinning vs infarct; no ischemia.     BriaKirk Ruths

## 2010-11-28 ENCOUNTER — Other Ambulatory Visit: Payer: Self-pay | Admitting: Cardiovascular Disease

## 2010-11-29 ENCOUNTER — Other Ambulatory Visit: Payer: Self-pay | Admitting: *Deleted

## 2010-11-29 MED ORDER — PRAVASTATIN SODIUM 80 MG PO TABS: 80.0000 mg | ORAL_TABLET | Freq: Every day | ORAL | Status: DC

## 2010-11-29 NOTE — Telephone Encounter (Signed)
Fax Received. Refill Completed. Edward [name] (M.A)  

## 2010-12-14 ENCOUNTER — Ambulatory Visit: Payer: Federal, State, Local not specified - PPO | Admitting: Cardiovascular Disease

## 2011-01-16 DIAGNOSIS — Z9049 Acquired absence of other specified parts of digestive tract: Secondary | ICD-10-CM | POA: Insufficient documentation

## 2011-01-16 HISTORY — PX: INSERT / REPLACE / REMOVE PACEMAKER: SUR710

## 2011-02-12 ENCOUNTER — Ambulatory Visit (INDEPENDENT_AMBULATORY_CARE_PROVIDER_SITE_OTHER): Payer: Federal, State, Local not specified - PPO | Admitting: Internal Medicine

## 2011-02-12 DIAGNOSIS — E782 Mixed hyperlipidemia: Secondary | ICD-10-CM

## 2011-02-12 DIAGNOSIS — D649 Anemia, unspecified: Secondary | ICD-10-CM

## 2011-02-12 DIAGNOSIS — E119 Type 2 diabetes mellitus without complications: Secondary | ICD-10-CM

## 2011-02-12 DIAGNOSIS — I2510 Atherosclerotic heart disease of native coronary artery without angina pectoris: Secondary | ICD-10-CM

## 2011-02-12 DIAGNOSIS — Z719 Counseling, unspecified: Secondary | ICD-10-CM

## 2011-02-13 ENCOUNTER — Encounter: Payer: Self-pay | Admitting: Vascular Surgery

## 2011-02-13 ENCOUNTER — Other Ambulatory Visit: Payer: Self-pay | Admitting: Emergency Medicine

## 2011-02-14 ENCOUNTER — Telehealth: Payer: Self-pay | Admitting: Cardiovascular Disease

## 2011-02-14 ENCOUNTER — Encounter: Payer: Self-pay | Admitting: Vascular Surgery

## 2011-02-14 ENCOUNTER — Ambulatory Visit (INDEPENDENT_AMBULATORY_CARE_PROVIDER_SITE_OTHER): Payer: Federal, State, Local not specified - PPO | Admitting: Cardiovascular Disease

## 2011-02-14 ENCOUNTER — Ambulatory Visit (INDEPENDENT_AMBULATORY_CARE_PROVIDER_SITE_OTHER): Payer: Federal, State, Local not specified - PPO | Admitting: Vascular Surgery

## 2011-02-14 ENCOUNTER — Other Ambulatory Visit: Payer: Self-pay | Admitting: Physician Assistant

## 2011-02-14 ENCOUNTER — Encounter (INDEPENDENT_AMBULATORY_CARE_PROVIDER_SITE_OTHER): Payer: Federal, State, Local not specified - PPO

## 2011-02-14 ENCOUNTER — Other Ambulatory Visit (INDEPENDENT_AMBULATORY_CARE_PROVIDER_SITE_OTHER): Payer: Federal, State, Local not specified - PPO | Admitting: *Deleted

## 2011-02-14 ENCOUNTER — Telehealth: Payer: Self-pay | Admitting: *Deleted

## 2011-02-14 ENCOUNTER — Encounter: Payer: Self-pay | Admitting: Cardiovascular Disease

## 2011-02-14 VITALS — BP 129/88 | HR 82 | Resp 16 | Ht 70.0 in | Wt 205.0 lb

## 2011-02-14 DIAGNOSIS — I70219 Atherosclerosis of native arteries of extremities with intermittent claudication, unspecified extremity: Secondary | ICD-10-CM

## 2011-02-14 DIAGNOSIS — I4891 Unspecified atrial fibrillation: Secondary | ICD-10-CM

## 2011-02-14 DIAGNOSIS — I6529 Occlusion and stenosis of unspecified carotid artery: Secondary | ICD-10-CM

## 2011-02-14 DIAGNOSIS — Z48812 Encounter for surgical aftercare following surgery on the circulatory system: Secondary | ICD-10-CM

## 2011-02-14 DIAGNOSIS — E785 Hyperlipidemia, unspecified: Secondary | ICD-10-CM

## 2011-02-14 LAB — BASIC METABOLIC PANEL
BUN: 18 mg/dL (ref 6–23)
GFR: 57.71 mL/min — ABNORMAL LOW (ref >60.00)
Glucose, Bld: 198 mg/dL — ABNORMAL HIGH (ref 70–99)
Potassium: 4.1 mEq/L (ref 3.5–5.1)

## 2011-02-14 LAB — HEPATIC FUNCTION PANEL
AST: 25 U/L (ref 0–37)
Albumin: 3.6 g/dL (ref 3.5–5.2)

## 2011-02-14 LAB — LIPID PANEL
Cholesterol: 136 mg/dL (ref 0–200)
Triglycerides: 115 mg/dL (ref 0.0–149.0)
VLDL: 23 mg/dL (ref 0.0–40.0)

## 2011-02-14 MED ORDER — POTASSIUM CHLORIDE CRYS ER 20 MEQ PO TBCR: 20.0000 meq | EXTENDED_RELEASE_TABLET | Freq: Every day | ORAL | Status: DC

## 2011-02-14 MED ORDER — LORATADINE 10 MG PO TABS: 10.0000 mg | ORAL_TABLET | Freq: Every day | ORAL | Status: DC

## 2011-02-14 NOTE — Assessment & Plan Note (Signed)
This patient has stable left lower extremity claudication. He has a palpable left femoral pulse with evidence of infrainguinal arterial occlusive disease on the left. He remains very active and is on a structured walking program. He has no history of tobacco use. We will follow him from a symptomatic standpoint. He will not need follow up ABIs unless his symptoms progress. I'll see him back in one year.

## 2011-02-14 NOTE — Progress Notes (Signed)
[facility]  Patient name: Edward [name] MRN: [id number] DOB: 29-Jul-1940 Sex: male  REASON FOR VISIT: Follow up of carotid disease and peripheral vascular disease  HPI: Edward [name] is a 71 y.o. male who underwent a right carotid endarterectomy in March of 2011 for asymptomatic right carotid stenosis. He comes in for a routine follow up visit. Since I saw him last he said no history of stroke, TIAs, expressive or receptive aphasia, or amaurosis fugax.  The patient also has a history of her full vascular disease on the left with stable claudication of the left leg. He experiences pain in the calf associated with ambulation and relieved with rest. He symptoms have remained stable. He can walk for proximal and 9010 laps around the Gym a before having to stop.  Past Medical History  Diagnosis Date  . A-fib   . HTN (hypertension)   . Diabetes mellitus   . Dyslipidemia   . PVD (peripheral vascular disease)     s/p Right CEA March, 2011,   . Claudication   . Hyperlipidemia     Family History  Problem Relation Age of Onset  . Coronary artery disease Father   . Hypertension Father   . Heart attack Mother   . Coronary artery disease Mother   . Hypertension Sister     SOCIAL HISTORY: History  Substance Use Topics  . Smoking status: Never Smoker   . Smokeless tobacco: Not on file  . Alcohol Use: Yes     OCC.    Allergies  Allergen Reactions  . Lipitor (Atorvastatin Calcium)   . Morphine And Related Itching    Current Outpatient Prescriptions  Medication Sig Dispense Refill  . Calcium Carbonate-Vitamin D (CALCIUM + D PO) Take 1,200 mg by mouth daily.        . carvedilol (COREG) 25 MG tablet Take 25 mg by mouth 2 (two) times daily.       . Cholecalciferol (VITAMIN D-3 PO) Take 1,000 mg by mouth.        . Coenzyme Q10 (COQ10) 200 MG CAPS Take 1 capsule by mouth daily.        Marland Kitchen exenatide (BYETTA 10 MCG PEN) 10 MCG/0.04ML SOLN Inject 10 mcg into  the skin daily.   1.2 mL    . flecainide (TAMBOCOR) 100 MG tablet Take 1.5 tablets (150 mg total) by mouth 2 (two) times daily.  90 tablet  12  . furosemide (LASIX) 40 MG tablet Take 40 mg by mouth daily.        Marland Kitchen L-Methylfolate-B6-B12 (METANX PO) Take by mouth daily.        Marland Kitchen losartan (COZAAR) 100 MG tablet Take 100 mg by mouth daily.       . Magnesium 500 MG CAPS Take 250 mg by mouth daily.       . metFORMIN (GLUCOPHAGE) 1000 MG tablet Take 1,000 mg by mouth daily.       . Multiple Vitamin (MULTIVITAMIN) tablet Take 1 tablet by mouth daily.        Marland Kitchen omeprazole (PRILOSEC OTC) 20 MG tablet Take 20 mg by mouth daily.        . potassium chloride SA (K-DUR,KLOR-CON) 20 MEQ tablet Take 20 mEq by mouth daily.        . hydrochlorothiazide 25 MG tablet Take 25 mg by mouth daily.        Marland Kitchen loratadine-pseudoephedrine (CLARITIN-D 24-HOUR) 10-240 MG per 24 hr tablet Take 1 tablet by  mouth daily.         REVIEW OF SYSTEMS: Valu.Nieves ] denotes positive finding; [  ] denotes negative finding  CARDIOVASCULAR:  [ ]  chest pain   [ ]  chest pressure   [ ]  palpitations   [ ]  orthopnea   [ ]  dyspnea on exertion   [ ]  claudication   [ ]  rest pain   [ ]  DVT   [ ]  phlebitis PULMONARY:   [ ]  productive cough   [ ]  asthma   [ ]  wheezing NEUROLOGIC:   [ ]  weakness  [ ]  paresthesias  [ ]  aphasia  [ ]  amaurosis  [ ]  dizziness HEMATOLOGIC:   [ ]  bleeding problems   [ ]  clotting disorders MUSCULOSKELETAL:  [ ]  joint pain   [ ]  joint swelling [ ]  leg swelling GASTROINTESTINAL: [ ]   blood in stool  [ ]   hematemesis GENITOURINARY:  [ ]   dysuria  [ ]   hematuria PSYCHIATRIC:  [ ]  history of major depression INTEGUMENTARY:  [ ]  rashes  [ ]  ulcers CONSTITUTIONAL:  [ ]  fever   [ ]  chills  PHYSICAL EXAM: Filed Vitals:   02/14/11 1421 02/14/11 1422  BP: 130/89 129/88  Pulse: 80 82  Resp: 16   Height: 5' 10"  (1.778 m)   Weight: 205 lb (92.987 kg)   SpO2: 98% 98%   Body mass index is 29.41 kg/(m^2). GENERAL: The patient is a  well-nourished male, in no acute distress. The vital signs are documented above. CARDIOVASCULAR: There is a regular rate and rhythm without significant murmur appreciated. I do not detect carotid bruits. He has palpable femoral pulses bilaterally. He has a right popliteal and right dorsalis pedis pulse. I cannot palpate a left popliteal or left dorsalis pedis pulse. Feet are warm and well-perfused. He has no significant lower extremity swelling PULMONARY: There is good air exchange bilaterally without wheezing or rales. ABDOMEN: Soft and non-tender with normal pitched bowel sounds.  MUSCULOSKELETAL: There are no major deformities or cyanosis. NEUROLOGIC: No focal weakness or paresthesias are detected. SKIN: There are no ulcers or rashes noted. PSYCHIATRIC: The patient has a normal affect.  DATA:  Lab Results  Component Value Date   CREATININE 1.3 02/14/2011   I have independently interpreted his carotid duplex scan which shows no evidence of recurrent carotid stenosis on the right and no evidence of left carotid stenosis. Both arteries are widely patent.  MEDICAL ISSUES:  CAROTID ARTERY DISEASE This patient underwent a right carotid endarterectomy in March of 2011 for a greater than 80% right carotid stenosis which was asymptomatic. He comes in for a routine follow up visit. He remains asymptomatic.His carotid duplex scan shows that his right carotid endarterectomy site is widely patent. There is no evidence of restenosis. He has no evidence of stenosis on the left. Ordered a fall carotid duplex scan in 1 year and I'll see him back at that time.  Atherosclerosis of native arteries of the extremities with intermittent claudication This patient has stable left lower extremity claudication. He has a palpable left femoral pulse with evidence of infrainguinal arterial occlusive disease on the left. He remains very active and is on a structured walking program. He has no history of tobacco use. We  will follow him from a symptomatic standpoint. He will not need follow up ABIs unless his symptoms progress. I'll see him back in one year.   of note, the patient is due for his yearly [place] physical. From a vascular standpoint  certainly I do not see any contraindications for him to continue flying.   [place] [facility] [place]: [phone]

## 2011-02-14 NOTE — Telephone Encounter (Signed)
New msg Pt's wife said he no longer takes hctz and he takes claritin not claritin-d please call her back if have any questions

## 2011-02-14 NOTE — Telephone Encounter (Signed)
PT CALLED CHANGES IN MEDS/ MEDS CORRECTED

## 2011-02-14 NOTE — Progress Notes (Signed)
Edward [name] Date of Birth  Sep 10, 1940 Edward [name]  [address]    [place]   [name], [name]  [address]    [place], Edward [name]  [address] [phone]  Fax  [phone]  [phone]  Fax [phone]  Problem list:  1. Atrial fibrillation 2. Hypertension 3. Diabetes mellitus 4. Dyslipidemia 5. Peripheral vascular disease:-Status post right carotid endarterectomy and status post PTA by Dr. Sherren Mocha 6. History of GI bleed-currently not on antiplatelet agents or anticoagulants-  7. Anemia A. History of TIA  History of Present Illness:  Arold is doing very well from a cardiac standpoint. He's not had any episodes of chest pain or shortness of breath. He does not report any palpitations.  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  He and his wife visited the [place] several months ago. They had a very eventful trip.  He's not had any cardiac problems. He denies any chest pain or shortness breath.   Current Outpatient Prescriptions on File Prior to Visit  Medication Sig Dispense Refill  . Calcium Carbonate-Vitamin D (CALCIUM + D PO) Take 1,200 mg by mouth daily.        . carvedilol (COREG) 25 MG tablet Take 25 mg by mouth 2 (two) times daily.       . Cholecalciferol (VITAMIN D-3 PO) Take 1,000 mg by mouth.        . Coenzyme Q10 (COQ10) 200 MG CAPS Take 1 capsule by mouth daily.        Marland Kitchen exenatide (BYETTA 10 MCG PEN) 10 MCG/0.04ML SOLN Inject 10 mcg into the skin daily.   1.2 mL    . flecainide (TAMBOCOR) 100 MG tablet Take 1.5 tablets (150 mg total) by mouth 2 (two) times daily.  90 tablet  12  . furosemide (LASIX) 40 MG tablet Take 40 mg by mouth daily.        . hydrochlorothiazide 25 MG tablet Take 25 mg by mouth daily.        Marland Kitchen L-Methylfolate-B6-B12 (METANX PO) Take by mouth daily.        Marland Kitchen loratadine-pseudoephedrine (CLARITIN-D 24-HOUR) 10-240 MG per 24 hr tablet Take 1 tablet by mouth daily.         Marland Kitchen losartan (COZAAR) 100 MG tablet Take 100 mg by mouth daily.       . Magnesium 500 MG CAPS Take 250 mg by mouth daily.       . metFORMIN (GLUCOPHAGE) 1000 MG tablet Take 1,000 mg by mouth daily.       . Multiple Vitamin (MULTIVITAMIN) tablet Take 1 tablet by mouth daily.        Marland Kitchen omeprazole (PRILOSEC OTC) 20 MG tablet Take 20 mg by mouth daily.        . potassium chloride SA (K-DUR,KLOR-CON) 20 MEQ tablet Take 20 mEq by mouth daily.          Allergies  Allergen Reactions  . Lipitor (Atorvastatin Calcium)   . Morphine And Related Itching    Past Medical History  Diagnosis Date  . A-fib   . HTN (hypertension)   . Diabetes mellitus   . Dyslipidemia   . PVD (peripheral vascular disease)     s/p Right CEA March, 2011,   . Claudication     Past Surgical History  Procedure Date  . Carotid endarterectomy     RIGHT  . Cholecystectomy   .  Tonsillectomy and adenoidectomy   . Knee surgery     History  Smoking status  . Never Smoker   Smokeless tobacco  . Not on file    History  Alcohol Use  . Yes    OCC.    Family History  Problem Relation Age of Onset  . Coronary artery disease Father   . Hypertension Father   . Heart attack Mother   . Coronary artery disease Mother   . Hypertension Sister     Reviw of Systems:  Reviewed in the HPI.  All other systems are negative.  Physical Exam: BP 129/79  Pulse 88  Ht 6' (1.829 m)  Wt 225 lb (102.059 kg)  BMI 30.52 kg/m2 The patient is alert and oriented x 3.  The mood and affect are normal.   Skin: warm and dry.  Color is normal.    HEENT:   Normocephalic/atraumatic. He has a right carotid endarterectomy scar. His carotids are 2+. No JVD. His neck is supple. The membranes are moist.  Lungs: Lungs are clear to   Heart: Irregularly irregular. No murmurs.    Abdomen: Good bowel sounds. No hepatosplenomegaly. His abdomen is nontender.  Extremities:  No clubbing cyanosis or edema.  Neuro:  Neuro exam is nonfocal.     ECG: Atrial fibrillation. Has a nonspecific IVCD.  Assessment / Plan:

## 2011-02-14 NOTE — Assessment & Plan Note (Signed)
Remains stable. He's not having any episodes of angina. He had a low risk stress Myoview study in November, 2012.

## 2011-02-14 NOTE — Patient Instructions (Signed)
Your physician has recommended that you wear a holter monitor. Holter monitors are medical devices that record the heart's electrical activity. Doctors most often use these monitors to diagnose arrhythmias. Arrhythmias are problems with the speed or rhythm of the heartbeat. The monitor is a small, portable device. You can wear one while you do your normal daily activities. This is usually used to diagnose what is causing palpitations/syncope (passing out).  Your physician recommends that you return for a FASTING lipid profile: TODAY  Your physician wants you to follow-up in: [place] will receive a reminder letter in the mail two months in advance. If you don't receive a letter, please call our office to schedule the follow-up appointment.

## 2011-02-14 NOTE — Assessment & Plan Note (Signed)
Remains in atrial fibrillation. Will continue flecainide and upset will help limit his atrial fibrillation. We've discussed starting him on aspirin and/or anticoagulants. He is a poor candidate for anticoagulants because of his GI bleed. He had severe anemia and hypotension after having a GI bleed. This was a near life-threatening incident.

## 2011-02-14 NOTE — Assessment & Plan Note (Signed)
This patient underwent a right carotid endarterectomy in March of 2011 for a greater than 80% right carotid stenosis which was asymptomatic. He comes in for a routine follow up visit. He remains asymptomatic.His carotid duplex scan shows that his right carotid endarterectomy site is widely patent. There is no evidence of restenosis. He has no evidence of stenosis on the left. Ordered a fall carotid duplex scan in 1 year and I'll see him back at that time.

## 2011-02-15 ENCOUNTER — Telehealth: Payer: Self-pay

## 2011-02-15 ENCOUNTER — Other Ambulatory Visit: Payer: Self-pay | Admitting: *Deleted

## 2011-02-15 MED ORDER — PRAVASTATIN SODIUM 80 MG PO TABS: 80.0000 mg | ORAL_TABLET | Freq: Every day | ORAL | Status: DC

## 2011-02-15 NOTE — Telephone Encounter (Signed)
Left message on machine to return our call.

## 2011-02-15 NOTE — Progress Notes (Signed)
Called pt with lab results copy made for pt to pick up, mar updated pt is on pravastatin, was stopped when in hospital for bleed.

## 2011-02-15 NOTE — Telephone Encounter (Signed)
.  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 ALSO WAS CONCERNED ABOUT HIS AIC AND WHAT THE LEVEL WAS. HE WOULD LIKE TO COME BY AND P/U. PLEASE CALL HIS CELL AT [phone]

## 2011-02-16 ENCOUNTER — Telehealth: Payer: Self-pay | Admitting: *Deleted

## 2011-02-16 ENCOUNTER — Encounter: Payer: Self-pay | Admitting: *Deleted

## 2011-02-16 ENCOUNTER — Telehealth: Payer: Self-pay | Admitting: Cardiovascular Disease

## 2011-02-16 NOTE — Telephone Encounter (Signed)
WE WILL GIVE COPY OF MONITOR RESULTS WITH LETTER FOR FAA TOLD THEM IT WILL BE READY BY 02/20/11 OR BEFORE, I WILL CALL WHEN DONE.

## 2011-02-16 NOTE — Telephone Encounter (Signed)
New Problem    Patient wife request call from nurse concerning holter monitor results, she can be reached at hm#

## 2011-02-16 NOTE — Telephone Encounter (Signed)
msg left that [place] letter and copies of Holter are at front desk.

## 2011-02-20 ENCOUNTER — Telehealth: Payer: Self-pay

## 2011-02-20 NOTE — Telephone Encounter (Signed)
Pt returning our call. Looked up last phone enc and started to d/w pt what he had requested at last call. Pt stated he had actually already p/up letter and A1c result and doesn't need anything else.

## 2011-02-21 NOTE — Procedures (Unsigned)
CAROTID DUPLEX EXAM  INDICATION:  Followup carotid artery disease.  HISTORY: Diabetes:  Yes Cardiac:  No Hypertension:  Yes Smoking:  No Previous Surgery:  Right carotid endarterectomy 03/22/2009 CV History:  Currently asymptomatic Amaurosis Fugax No, Paresthesias No, Hemiparesis No                                      RIGHT             LEFT Brachial systolic pressure:         130               137 Brachial Doppler waveforms:         Normal            Normal Vertebral direction of flow:        To and fro        Antegrade DUPLEX VELOCITIES (cm/sec) CCA peak systolic                   72                78 ECA peak systolic                   96                45 ICA peak systolic                   79                63 ICA end diastolic                   28                25 PLAQUE MORPHOLOGY:                  Heterogenous      Mixed PLAQUE AMOUNT:                      Minimal           Minimal PLAQUE LOCATION:                    CCA               CCA, ICA  IMPRESSION: 1. Patent right carotid endarterectomy site, with no evidence of     restenosis of the ICA. 2. Left ICA velocity suggests 1 to 39% stenosis. 3. Stable on comparison to the previous exam.  ___________________________________________ Judeth Cornfield. Scot Dock, M.D.  EM/MEDQ  D:  02/14/2011  T:  02/14/2011  Job:  [id number]

## 2011-02-22 NOTE — Progress Notes (Signed)
Addended by: Doug Sou D on: 02/22/2011 03:56 PM   Modules accepted: Orders

## 2011-03-13 ENCOUNTER — Ambulatory Visit (INDEPENDENT_AMBULATORY_CARE_PROVIDER_SITE_OTHER): Payer: Self-pay | Admitting: Emergency Medicine

## 2011-03-13 DIAGNOSIS — E785 Hyperlipidemia, unspecified: Secondary | ICD-10-CM

## 2011-03-13 DIAGNOSIS — I779 Disorder of arteries and arterioles, unspecified: Secondary | ICD-10-CM

## 2011-03-13 DIAGNOSIS — Z0289 Encounter for other administrative examinations: Secondary | ICD-10-CM

## 2011-03-13 DIAGNOSIS — I4891 Unspecified atrial fibrillation: Secondary | ICD-10-CM

## 2011-03-13 NOTE — Progress Notes (Signed)
  Subjective:    Patient ID: Edward [name], male    DOB: 13-Mar-1940, 71 y.o.   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 The patient currently has a history of diabetes carotid artery disease atrial fibrillation hypertension and high cholesterol. He is followed by Dr. Elder Cyphers for these medical problems. He has forwarded that has been requested by the [place] to the [facility] office he is here today for completion of his [place] exam.    Review of Systems patient states he feels fine. His sugars have been under relatively good control. He continues to see Dr. Elder Cyphers regularly.     Objective:   Physical Exam H. 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 He does qualify as instructed for issuance of a class III certificate. We'll complete the forms for his FAA physical and submitted them for Almyra Free to be forwarded to the [facility] and scan and

## 2011-03-29 ENCOUNTER — Ambulatory Visit (INDEPENDENT_AMBULATORY_CARE_PROVIDER_SITE_OTHER): Payer: Federal, State, Local not specified - PPO | Admitting: Emergency Medicine

## 2011-03-29 VITALS — BP 167/104 | HR 90 | Temp 98.7°F | Resp 18

## 2011-03-29 DIAGNOSIS — I4891 Unspecified atrial fibrillation: Secondary | ICD-10-CM

## 2011-03-29 DIAGNOSIS — R059 Cough, unspecified: Secondary | ICD-10-CM

## 2011-03-29 DIAGNOSIS — R05 Cough: Secondary | ICD-10-CM

## 2011-03-29 DIAGNOSIS — J45909 Unspecified asthma, uncomplicated: Secondary | ICD-10-CM

## 2011-03-29 MED ORDER — BENZONATATE 100 MG PO CAPS: 100.0000 mg | ORAL_CAPSULE | Freq: Three times a day (TID) | ORAL | Status: AC | PRN

## 2011-03-29 MED ORDER — HYDROCOD POLST-CHLORPHEN POLST 10-8 MG/5ML PO LQCR: 5.0000 mL | Freq: Two times a day (BID) | ORAL | Status: DC | PRN

## 2011-03-29 MED ORDER — ALBUTEROL SULFATE (2.5 MG/3ML) 0.083% IN NEBU
2.5000 mg | INHALATION_SOLUTION | Freq: Once | RESPIRATORY_TRACT | Status: AC
  Administered 2011-03-29: 2.5 mg via RESPIRATORY_TRACT

## 2011-03-29 NOTE — Progress Notes (Signed)
  Subjective:    Patient ID: Edward [name], male    DOB: 19-May-1940, 71 y.o.   MRN: [id number]  HPI patient here with 3 to four-day history of chest congestion. He denies being sick. He has not had any fever or chills or other symptoms. His headache nonproductive cough he's been to the cardiologist and been checked recently and told that his cardiac situation is stable. He has not had any peripheral edema develop.    Review of Systems as per present illness.     Objective:   Physical Exam  Constitutional: He appears well-developed and well-nourished.  HENT:  Right Ear: External ear normal.  Left Ear: External ear normal.  Eyes: Pupils are equal, round, and reactive to light.  Neck: No JVD present. No tracheal deviation present. No thyromegaly present.  Cardiovascular: Normal heart sounds.        Irregular rhythm no murmur.  Pulmonary/Chest: No respiratory distress. He has wheezes. He has no rales. He exhibits no tenderness.  Abdominal: He exhibits no distension. There is no tenderness. There is no rebound and no guarding.  Lymphadenopathy:    He has no cervical adenopathy.   .        Assessment & Plan:  Patient appears to have reactive airways disease. He does not appear acutely. He says his recent cardiac evaluation was good. He did not have a good response to his nebulizer treatment we'll treat with cough suppressant

## 2011-04-16 ENCOUNTER — Other Ambulatory Visit: Payer: Self-pay | Admitting: *Deleted

## 2011-04-16 MED ORDER — CARVEDILOL 25 MG PO TABS: 25.0000 mg | ORAL_TABLET | Freq: Every day | ORAL | Status: DC

## 2011-04-16 NOTE — Telephone Encounter (Signed)
Fax Received. Refill Completed. Edward [name] (R.M.A)   

## 2011-04-17 ENCOUNTER — Other Ambulatory Visit: Payer: Self-pay | Admitting: *Deleted

## 2011-04-18 ENCOUNTER — Other Ambulatory Visit: Payer: Self-pay | Admitting: *Deleted

## 2011-04-18 NOTE — Telephone Encounter (Signed)
Opened in Error.

## 2011-04-28 ENCOUNTER — Other Ambulatory Visit: Payer: Self-pay | Admitting: Emergency Medicine

## 2011-05-16 ENCOUNTER — Other Ambulatory Visit: Payer: Federal, State, Local not specified - PPO

## 2011-05-16 ENCOUNTER — Ambulatory Visit: Payer: Federal, State, Local not specified - PPO | Admitting: Vascular Surgery

## 2011-05-22 ENCOUNTER — Telehealth: Payer: Self-pay | Admitting: Cardiovascular Disease

## 2011-05-22 NOTE — Telephone Encounter (Signed)
i had left msg today, pt didn't check his voice mail. App set

## 2011-05-22 NOTE — Telephone Encounter (Signed)
Pt calling re possibly needing pace maker? Sent letter to dr Acie Fredrickson last week re this but hasn't heard anything from [place]

## 2011-05-24 ENCOUNTER — Other Ambulatory Visit: Payer: Self-pay | Admitting: Cardiovascular Disease

## 2011-05-24 DIAGNOSIS — I4891 Unspecified atrial fibrillation: Secondary | ICD-10-CM

## 2011-05-24 MED ORDER — FLECAINIDE ACETATE 100 MG PO TABS: 150.0000 mg | ORAL_TABLET | Freq: Two times a day (BID) | ORAL | Status: DC

## 2011-05-24 NOTE — Telephone Encounter (Signed)
Pt will be completely out of medication tomorrow and he needs this called in asap and asked me to send the message to [facility]

## 2011-05-24 NOTE — Telephone Encounter (Signed)
Fax Received. Refill Completed. [name] (R.M.A)   

## 2011-05-31 ENCOUNTER — Encounter: Payer: Self-pay | Admitting: *Deleted

## 2011-05-31 ENCOUNTER — Encounter: Payer: Self-pay | Admitting: Cardiovascular Disease

## 2011-05-31 ENCOUNTER — Ambulatory Visit (INDEPENDENT_AMBULATORY_CARE_PROVIDER_SITE_OTHER): Payer: Federal, State, Local not specified - PPO | Admitting: Cardiovascular Disease

## 2011-05-31 VITALS — BP 172/109 | HR 94 | Ht 72.0 in | Wt 225.0 lb

## 2011-05-31 DIAGNOSIS — Z01810 Encounter for preprocedural cardiovascular examination: Secondary | ICD-10-CM

## 2011-05-31 DIAGNOSIS — I4891 Unspecified atrial fibrillation: Secondary | ICD-10-CM

## 2011-05-31 DIAGNOSIS — I10 Essential (primary) hypertension: Secondary | ICD-10-CM

## 2011-05-31 DIAGNOSIS — I701 Atherosclerosis of renal artery: Secondary | ICD-10-CM

## 2011-05-31 LAB — CBC WITH DIFFERENTIAL/PLATELET
Basophils Relative: 0.6 % (ref 0.0–3.0)
Eosinophils Absolute: 0.4 10*3/uL (ref 0.0–0.7)
HCT: 38.4 % — ABNORMAL LOW (ref 39.0–52.0)
Hemoglobin: 12 g/dL — ABNORMAL LOW (ref 13.0–17.0)
Lymphocytes Relative: 9.7 % — ABNORMAL LOW (ref 12.0–46.0)
Lymphs Abs: 1.1 10*3/uL (ref 0.7–4.0)
MCHC: 31.3 g/dL (ref 30.0–36.0)
Monocytes Relative: 9 % (ref 3.0–12.0)
Neutro Abs: 8.7 10*3/uL — ABNORMAL HIGH (ref 1.4–7.7)
RBC: 5.35 Mil/uL (ref 4.22–5.81)

## 2011-05-31 LAB — BASIC METABOLIC PANEL
CO2: 24 mEq/L (ref 19–32)
Calcium: 8.6 mg/dL (ref 8.4–10.5)
Chloride: 105 mEq/L (ref 96–112)
Potassium: 4.1 mEq/L (ref 3.5–5.1)
Sodium: 137 mEq/L (ref 135–145)

## 2011-05-31 MED ORDER — AMLODIPINE BESYLATE 5 MG PO TABS: 5.0000 mg | ORAL_TABLET | Freq: Every day | ORAL | Status: DC

## 2011-05-31 NOTE — Assessment & Plan Note (Signed)
His blood pressure is moderately elevated today. We'll start him on amlodipine 5 mg a day.

## 2011-05-31 NOTE — Patient Instructions (Addendum)
Your physician has requested that you have a cardiac catheterization. Cardiac catheterization is used to diagnose and/or treat various heart conditions. Doctors may recommend this procedure for a number of different reasons. The most common reason is to evaluate chest pain. Chest pain can be a symptom of coronary artery disease (CAD), and cardiac catheterization can show whether plaque is narrowing or blocking your heart's arteries. This procedure is also used to evaluate the valves, as well as measure the blood flow and oxygen levels in different parts of your heart.  Please follow instruction sheet, as given.  Your physician recommends that you return for lab work in: today    Your physician has recommended you make the following change in your medication:   Start amlodipine 5 mg daily for blood pressure

## 2011-05-31 NOTE — Assessment & Plan Note (Addendum)
Edward [name] presents today with recurrent shortness breath with exertion. He's not able to do any of his significant outside activities because of his severe shortness breath. His symptoms seem to be out of proportion to what I would expect.  We discussed options concerning his atrial fibrillation. He clearly is back in atrial fibrillation now. He is on maximum dose of flecainide.  His stress Myoview study was read as low risk. He has mildly depressed left ventricular systolic function with an EF of 40%. He was also some question of mild attenuation.  I would  like to proceed with cardiac catheterization to ensure that he doesn't have significant coronary artery disease. If we do a cath and find that hs vessels are normal- then  we'll enroll him in the [facility] clinic.   He's had serious GI bleeding in the past when he was on Xarelto.  I think it we could get by with having him on Xarelto for one month if we started 3 or 4 days prior to enrolling in the [facility] clinic and then continued it for 3 weeks after cardioversion.

## 2011-05-31 NOTE — Progress Notes (Signed)
Edward [name] Date of Birth  May 29, 1940 Edward [name]  [address]    [name], [place]  [address]    [place], Edward [name]  [address] [phone]  Fax  [phone]  [phone]  Fax [phone]  Problem list:  1. Atrial fibrillation- not currently on any anticoagulation because of his history of severe GI bleed 2. Hypertension 3. Diabetes mellitus 4. Dyslipidemia 5. Peripheral vascular disease:-Status post right carotid endarterectomy and status post PTA by Dr. Sherren Mocha 6. History of GI bleed-currently not on antiplatelet agents or anticoagulants-  7. Anemia A. History of TIA  History of Present Illness:  Si  Is a 71 y.o. gentleman with a history as noted above. He was last cardioverted in fall of 2012. He presents today with worsening shortness of breath with exertion and generalized fatigue. He suspected that he was back in a fibrillation. EKG take today confirms the fact that he is back in atrial fibrillation.  He notices severe dyspnea with exertion when climbing stairs or doing any other exercise. He's not able to exercise much because of the shortness of breath.  His last Myoview study revealed mildly depressed left ventricular systolic function with some very mild attenuation. It was interpreted as a low risk study.   Current Outpatient Prescriptions on File Prior to Visit  Medication Sig Dispense Refill  . Calcium Carbonate-Vitamin D (CALCIUM + D PO) Take 1,200 mg by mouth daily.        . Cholecalciferol (VITAMIN D-3 PO) Take 1,000 mg by mouth.        . Coenzyme Q10 (COQ10) 200 MG CAPS Take 1 capsule by mouth daily.        Marland Kitchen exenatide (BYETTA 10 MCG PEN) 10 MCG/0.04ML SOLN Inject 10 mcg into the skin daily.   1.2 mL    . flecainide (TAMBOCOR) 100 MG tablet Take 1.5 tablets (150 mg total) by mouth 2 (two) times daily.  90 tablet  3  . furosemide (LASIX) 40 MG tablet TAKE 1 TABLET EVERY DAY  30 tablet   2  . KLOR-CON M20 20 MEQ tablet TAKE 1 TABLET EVERY DAY  30 tablet  5  . L-Methylfolate-B6-B12 (METANX PO) Take by mouth daily.        Marland Kitchen loratadine (CLARITIN) 10 MG tablet Take 1 tablet (10 mg total) by mouth daily.      Marland Kitchen losartan (COZAAR) 100 MG tablet Take 100 mg by mouth daily.       . Magnesium 500 MG CAPS Take 250 mg by mouth daily.       . metFORMIN (GLUCOPHAGE) 1000 MG tablet Take 1,000 mg by mouth 2 (two) times daily with a meal.       . Multiple Vitamin (MULTIVITAMIN) tablet Take 1 tablet by mouth daily.        Marland Kitchen omeprazole (PRILOSEC OTC) 20 MG tablet Take 20 mg by mouth daily.        . pravastatin (PRAVACHOL) 80 MG tablet Take 1 tablet (80 mg total) by mouth daily.  90 tablet  3  . DISCONTD: carvedilol (COREG) 25 MG tablet Take 1 tablet (25 mg total) by mouth daily.  30 tablet  5    Allergies  Allergen Reactions  . Fenofibrate   . Lipitor (Atorvastatin Calcium)   . Morphine And Related Itching    Past Medical History  Diagnosis Date  . A-fib   . HTN (  hypertension)   . Diabetes mellitus   . Dyslipidemia   . PVD (peripheral vascular disease)     s/p Right CEA March, 2011,   . Claudication   . Hyperlipidemia     Past Surgical History  Procedure Date  . Carotid endarterectomy     RIGHT  . Cholecystectomy   . Tonsillectomy and adenoidectomy   . Knee surgery     History  Smoking status  . Never Smoker   Smokeless tobacco  . Not on file    History  Alcohol Use  . Yes    OCC.    Family History  Problem Relation Age of Onset  . Coronary artery disease Father   . Hypertension Father   . Heart attack Mother   . Coronary artery disease Mother   . Hypertension Sister     Reviw of Systems:  Reviewed in the HPI.  All other systems are negative.  Physical Exam: BP 172/109  Pulse 94  Ht 6' (1.829 m)  Wt 225 lb (102.059 kg)  BMI 30.52 kg/m2 The patient is alert and oriented x 3.  The mood and affect are normal.   Skin: warm and dry.  Color is normal.     HEENT:   Normocephalic/atraumatic. He has a right carotid endarterectomy scar. His carotids are 2+. No JVD. His neck is supple. The membranes are moist.  Lungs: Lungs are clear to   Heart: Irregularly irregular. No murmurs.    Abdomen: Good bowel sounds. No hepatosplenomegaly. His abdomen is nontender.  Extremities:  No clubbing cyanosis or edema.  Neuro:  Neuro exam is nonfocal.    ECG: May 31, 2011-Atrial fibrillation. Has a nonspecific IVCD.  Assessment / Plan:

## 2011-06-01 ENCOUNTER — Other Ambulatory Visit: Payer: Self-pay | Admitting: *Deleted

## 2011-06-01 ENCOUNTER — Other Ambulatory Visit: Payer: Self-pay | Admitting: Cardiovascular Disease

## 2011-06-01 DIAGNOSIS — I4891 Unspecified atrial fibrillation: Secondary | ICD-10-CM

## 2011-06-01 MED ORDER — FLECAINIDE ACETATE 100 MG PO TABS: 150.0000 mg | ORAL_TABLET | Freq: Two times a day (BID) | ORAL | Status: DC

## 2011-06-01 MED ORDER — CARVEDILOL 25 MG PO TABS: 25.0000 mg | ORAL_TABLET | Freq: Two times a day (BID) | ORAL | Status: DC

## 2011-06-03 ENCOUNTER — Other Ambulatory Visit: Payer: Self-pay | Admitting: Family Medicine

## 2011-06-03 MED ORDER — FUROSEMIDE 40 MG PO TABS: 40.0000 mg | ORAL_TABLET | Freq: Every day | ORAL | Status: DC

## 2011-06-03 MED ORDER — POTASSIUM CHLORIDE CRYS ER 20 MEQ PO TBCR: 20.0000 meq | EXTENDED_RELEASE_TABLET | Freq: Every day | ORAL | Status: DC

## 2011-06-05 ENCOUNTER — Encounter (HOSPITAL_BASED_OUTPATIENT_CLINIC_OR_DEPARTMENT_OTHER): Payer: Self-pay

## 2011-06-05 ENCOUNTER — Encounter (HOSPITAL_BASED_OUTPATIENT_CLINIC_OR_DEPARTMENT_OTHER)
Admission: RE | Disposition: A | Discharge status: Home / Self Care | Payer: Self-pay | Source: Ambulatory Visit | Attending: Cardiovascular Disease

## 2011-06-05 ENCOUNTER — Inpatient Hospital Stay (HOSPITAL_BASED_OUTPATIENT_CLINIC_OR_DEPARTMENT_OTHER)
Admission: RE | Admit: 2011-06-05 | Discharge: 2011-06-05 | Disposition: A | Discharge status: Home / Self Care | Payer: Federal, State, Local not specified - PPO | Source: Ambulatory Visit | Attending: Cardiovascular Disease | Admitting: Cardiovascular Disease

## 2011-06-05 ENCOUNTER — Inpatient Hospital Stay (HOSPITAL_BASED_OUTPATIENT_CLINIC_OR_DEPARTMENT_OTHER): Admit: 2011-06-05 | Payer: Self-pay | Admitting: Cardiovascular Disease

## 2011-06-05 DIAGNOSIS — I2510 Atherosclerotic heart disease of native coronary artery without angina pectoris: Secondary | ICD-10-CM | POA: Insufficient documentation

## 2011-06-05 DIAGNOSIS — E119 Type 2 diabetes mellitus without complications: Secondary | ICD-10-CM | POA: Insufficient documentation

## 2011-06-05 DIAGNOSIS — R0989 Other specified symptoms and signs involving the circulatory and respiratory systems: Secondary | ICD-10-CM | POA: Insufficient documentation

## 2011-06-05 DIAGNOSIS — I4891 Unspecified atrial fibrillation: Secondary | ICD-10-CM | POA: Insufficient documentation

## 2011-06-05 DIAGNOSIS — I447 Left bundle-branch block, unspecified: Secondary | ICD-10-CM | POA: Insufficient documentation

## 2011-06-05 DIAGNOSIS — R0609 Other forms of dyspnea: Secondary | ICD-10-CM | POA: Insufficient documentation

## 2011-06-05 LAB — POCT I-STAT GLUCOSE
Glucose, Bld: 121 mg/dL — ABNORMAL HIGH (ref 70–99)
Operator id: 141321

## 2011-06-05 SURGERY — JV LEFT HEART CATHETERIZATION WITH CORONARY ANGIOGRAM
Anesthesia: Moderate Sedation

## 2011-06-05 MED ORDER — SODIUM CHLORIDE 0.9 % IV SOLN
INTRAVENOUS | Status: AC
Start: 2011-06-05 — End: 2011-06-05

## 2011-06-05 MED ORDER — SODIUM CHLORIDE 0.9 % IV SOLN: INTRAVENOUS | Status: DC

## 2011-06-05 MED ORDER — SODIUM CHLORIDE 0.9 % IJ SOLN: 3.0000 mL | INTRAMUSCULAR | Status: DC | PRN

## 2011-06-05 MED ORDER — SODIUM CHLORIDE 0.9 % IV SOLN: 250.0000 mL | INTRAVENOUS | Status: DC | PRN

## 2011-06-05 MED ORDER — ACETAMINOPHEN 325 MG PO TABS: 650.0000 mg | ORAL_TABLET | ORAL | Status: DC | PRN

## 2011-06-05 MED ORDER — ONDANSETRON HCL 4 MG/2ML IJ SOLN: 4.0000 mg | Freq: Four times a day (QID) | INTRAMUSCULAR | Status: DC | PRN

## 2011-06-05 MED ORDER — ASPIRIN 81 MG PO CHEW
324.0000 mg | CHEWABLE_TABLET | ORAL | Status: AC
  Administered 2011-06-05: 324 mg via ORAL

## 2011-06-05 MED ORDER — SODIUM CHLORIDE 0.9 % IJ SOLN: 3.0000 mL | Freq: Two times a day (BID) | INTRAMUSCULAR | Status: DC

## 2011-06-05 MED ORDER — DIAZEPAM 5 MG PO TABS
5.0000 mg | ORAL_TABLET | ORAL | Status: AC
  Administered 2011-06-05: 5 mg via ORAL

## 2011-06-05 NOTE — Progress Notes (Signed)
Bedrest begins @ 1200.

## 2011-06-05 NOTE — H&P (View-Only) (Signed)
Edward [name] Date of Birth  12-02-1940 Edward [name]  [address]    [place]   [place], [place]  [address]    [place],   [address] [phone]  Fax  [phone]  [phone]  Fax [phone]  Problem list:  1. Atrial fibrillation- not currently on any anticoagulation because of his history of severe GI bleed 2. Hypertension 3. Diabetes mellitus 4. Dyslipidemia 5. Peripheral vascular disease:-Status post right carotid endarterectomy and status post PTA by Dr. Sherren Mocha 6. History of GI bleed-currently not on antiplatelet agents or anticoagulants-  7. Anemia A. History of TIA  History of Present Illness:  Edward [name]  Is a 71 y.o. gentleman with a history as noted above. He was last cardioverted in fall of 2012. He presents today with worsening shortness of breath with exertion and generalized fatigue. He suspected that he was back in a fibrillation. EKG take today confirms the fact that he is back in atrial fibrillation.  He notices severe dyspnea with exertion when climbing stairs or doing any other exercise. He's not able to exercise much because of the shortness of breath.  His last Myoview study revealed mildly depressed left ventricular systolic function with some very mild attenuation. It was interpreted as a low risk study.   Current Outpatient Prescriptions on File Prior to Visit  Medication Sig Dispense Refill  . Calcium Carbonate-Vitamin D (CALCIUM + D PO) Take 1,200 mg by mouth daily.        . Cholecalciferol (VITAMIN D-3 PO) Take 1,000 mg by mouth.        . Coenzyme Q10 (COQ10) 200 MG CAPS Take 1 capsule by mouth daily.        Marland Kitchen exenatide (BYETTA 10 MCG PEN) 10 MCG/0.04ML SOLN Inject 10 mcg into the skin daily.   1.2 mL    . flecainide (TAMBOCOR) 100 MG tablet Take 1.5 tablets (150 mg total) by mouth 2 (two) times daily.  90 tablet  3  . furosemide (LASIX) 40 MG tablet TAKE 1 TABLET EVERY DAY  30 tablet   2  . KLOR-CON M20 20 MEQ tablet TAKE 1 TABLET EVERY DAY  30 tablet  5  . L-Methylfolate-B6-B12 (METANX PO) Take by mouth daily.        Marland Kitchen loratadine (CLARITIN) 10 MG tablet Take 1 tablet (10 mg total) by mouth daily.      Marland Kitchen losartan (COZAAR) 100 MG tablet Take 100 mg by mouth daily.       . Magnesium 500 MG CAPS Take 250 mg by mouth daily.       . metFORMIN (GLUCOPHAGE) 1000 MG tablet Take 1,000 mg by mouth 2 (two) times daily with a meal.       . Multiple Vitamin (MULTIVITAMIN) tablet Take 1 tablet by mouth daily.        Marland Kitchen omeprazole (PRILOSEC OTC) 20 MG tablet Take 20 mg by mouth daily.        . pravastatin (PRAVACHOL) 80 MG tablet Take 1 tablet (80 mg total) by mouth daily.  90 tablet  3  . DISCONTD: carvedilol (COREG) 25 MG tablet Take 1 tablet (25 mg total) by mouth daily.  30 tablet  5    Allergies  Allergen Reactions  . Fenofibrate   . Lipitor (Atorvastatin Calcium)   . Morphine And Related Itching    Past Medical History  Diagnosis Date  . A-fib   . HTN (  hypertension)   . Diabetes mellitus   . Dyslipidemia   . PVD (peripheral vascular disease)     s/p Right CEA March, 2011,   . Claudication   . Hyperlipidemia     Past Surgical History  Procedure Date  . Carotid endarterectomy     RIGHT  . Cholecystectomy   . Tonsillectomy and adenoidectomy   . Knee surgery     History  Smoking status  . Never Smoker   Smokeless tobacco  . Not on file    History  Alcohol Use  . Yes    OCC.    Family History  Problem Relation Age of Onset  . Coronary artery disease Father   . Hypertension Father   . Heart attack Mother   . Coronary artery disease Mother   . Hypertension Sister     Reviw of Systems:  Reviewed in the HPI.  All other systems are negative.  Physical Exam: BP 172/109  Pulse 94  Ht 6' (1.829 m)  Wt 225 lb (102.059 kg)  BMI 30.52 kg/m2 The patient is alert and oriented x 3.  The mood and affect are normal.   Skin: warm and dry.  Color is normal.     HEENT:   Normocephalic/atraumatic. He has a right carotid endarterectomy scar. His carotids are 2+. No JVD. His neck is supple. The membranes are moist.  Lungs: Lungs are clear to   Heart: Irregularly irregular. No murmurs.    Abdomen: Good bowel sounds. No hepatosplenomegaly. His abdomen is nontender.  Extremities:  No clubbing cyanosis or edema.  Neuro:  Neuro exam is nonfocal.    ECG: May 31, 2011-Atrial fibrillation. Has a nonspecific IVCD.  Assessment / Plan:

## 2011-06-05 NOTE — Interval H&P Note (Signed)
History and Physical Interval Note:  06/05/2011 10:25 AM  Edward [name]  has presented today for surgery, with the diagnosis of cp  The various methods of treatment have been discussed with the patient and family. After consideration of risks, benefits and other options for treatment, the patient has consented to  Procedure(s) (LRB): JV LEFT HEART CATHETERIZATION WITH CORONARY ANGIOGRAM (N/A) as a surgical intervention .  The patients' history has been reviewed, patient examined, no change in status, stable for surgery.  I have reviewed the patients' chart and labs.  Questions were answered to the patient's satisfaction.     [name].

## 2011-06-05 NOTE — CV Procedure (Signed)
    Cardiac Cath Note  Edward [name] [id number] 01-23-1940  Procedure: left  Heart Cardiac Catheterization Note Indications: dyspnea, LBBB, A-fib  Procedure Details Consent: Obtained Time Out: Verified patient identification, verified procedure, site/side was marked, verified correct patient position, special equipment/implants available, Radiology Safety Procedures followed,  medications/allergies/relevent history reviewed, required imaging and test results available.  Performed   Medications: Fentanyl: 50 mcg IV Versed: 2 mg IV  The right femoral artery was easily canulated using a modified Seldinger technique.  Hemodynamics:   LV pressure: 167/14/26 Aortic pressure: 175/96  Angiography   Left Main: mild taper  Left anterior Descending: The LAD is moderately - heavely calcified.  There is a 90% stenosis in the proxomal LAD ( just prior to 1st diag)  followed by a 60-70% stenosis at the bifurcation of the LAD and 2nd diag.    Left Circumflex: moderate size vessel. 50% stenosis in mid vessel  Right Coronary Artery: large and dominate.  Minor luminal irregularities.  There is a 50 % stenosis at the take off of the posterior descending artery and a ~50% stenosis at the take off of the distal most posterior lateral vessel.  LV Gram: moderate LV dysfunction.  EF 40-50%.  Anterior akinesis.  Complications: No apparent complications Patient did tolerate procedure well.  Conclusions:   1. CAD : primarily of the LAD but also has moderate disease of the LCx and RCA.  The LAD stenosis is heavily calcified.  He is diabetic.  He has had significant GI bleeds with Xarelto / plavix /  And aspirin.  We will refer to [facility] for consult.    Edward [name], Edward [name]., MD, [facility] 06/05/2011, 11:39 AM Office - [phone] Pager [phone]

## 2011-06-05 NOTE — Progress Notes (Signed)
Lunch served

## 2011-06-06 ENCOUNTER — Institutional Professional Consult (permissible substitution) (INDEPENDENT_AMBULATORY_CARE_PROVIDER_SITE_OTHER): Payer: Federal, State, Local not specified - PPO | Admitting: Surgery

## 2011-06-06 ENCOUNTER — Encounter (HOSPITAL_COMMUNITY): Payer: Self-pay | Admitting: Pharmacy Technician

## 2011-06-06 ENCOUNTER — Encounter: Payer: Self-pay | Admitting: Surgery

## 2011-06-06 VITALS — BP 145/90 | HR 70 | Resp 20 | Ht 70.0 in | Wt 245.0 lb

## 2011-06-06 DIAGNOSIS — I4891 Unspecified atrial fibrillation: Secondary | ICD-10-CM

## 2011-06-06 DIAGNOSIS — I2510 Atherosclerotic heart disease of native coronary artery without angina pectoris: Secondary | ICD-10-CM

## 2011-06-06 NOTE — Progress Notes (Signed)
[address]            [place] [address]          [phone]      PCP is GUEST, Veneda Melter, MD, MD Referring Provider is Nahser, Wonda Cheng, MD  Chief Complaint  Patient presents with  . Coronary Artery Disease    Referral from Dr Acie Fredrickson for eval eval on CAD, Cardiac Cath 06/05/11  . Atrial Fibrillation    HPI:  The patient is a 71 year old gentleman with a several year history of recurrent atrial fibrillation treated with antiarrhythmic therapy and multiple cardioversions who has not been maintained on anticoagulation due to GI bleeding. He had upper and lower endoscopy which did not show any obvious bleeding site although he did have diverticulosis of the colon. He now presents with a several month history of exertional dyspnea and generalized fatigue that seems to be getting worse. He was found to be back in atrial fibrillation. His last Myoview examination was felt to be a low risk study given his symptoms he underwent cardiac catheterization which showed significant multivessel coronary disease with a 90% proximal and 70% mid LAD stenosis compromising 2 diagonal branches as well as moderate disease in the left circumflex and dominant right coronary circulation. Left ventricular ejection fraction of 40-50% with anterior akinesis.  Past Medical History  Diagnosis Date  . A-fib   . HTN (hypertension)   . Diabetes mellitus   . Dyslipidemia   . PVD (peripheral vascular disease)     s/p Right CEA March, 2011,   . Claudication   . Hyperlipidemia     Past Surgical History  Procedure Date  . Carotid endarterectomy     RIGHT  . Cholecystectomy   . Tonsillectomy and adenoidectomy   . Knee surgery     Family History  Problem Relation Age of Onset  . Coronary artery disease Father   . Hypertension Father   . Heart attack Mother   . Coronary artery disease Mother   . Hypertension Sister     Social History History  Substance Use Topics   . Smoking status: Former Smoker    Types: Cigarettes    Quit date: 01/16/1979  . Smokeless tobacco: Never Used  . Alcohol Use: No     OCC.    Current Outpatient Prescriptions  Medication Sig Dispense Refill  . amLODipine (NORVASC) 5 MG tablet Take 1 tablet (5 mg total) by mouth daily.  180 tablet  3  . aspirin 81 MG tablet Take 81 mg by mouth daily.      . Calcium Carbonate-Vitamin D (CALCIUM + D PO) Take 1,200 mg by mouth daily.        . carvedilol (COREG) 25 MG tablet Take 25 mg by mouth 1 day or 1 dose.      . Coenzyme Q10 (COQ10) 200 MG CAPS Take 1 capsule by mouth daily.        Marland Kitchen exenatide (BYETTA 10 MCG PEN) 10 MCG/0.04ML SOLN Inject 10 mcg into the skin daily.   1.2 mL    . flecainide (TAMBOCOR) 100 MG tablet Take 1.5 tablets (150 mg total) by mouth 2 (two) times daily.  270 tablet  1  . furosemide (LASIX) 40 MG tablet Take 1 tablet (40 mg total) by mouth daily.  30 tablet  2  . isosorbide mononitrate (IMDUR) 30 MG 24 hr  tablet Take 60 mg by mouth as needed.       . loratadine (CLARITIN) 10 MG tablet Take 1 tablet (10 mg total) by mouth daily.      Marland Kitchen losartan (COZAAR) 100 MG tablet Take 100 mg by mouth daily.       . Magnesium 500 MG CAPS Take 250 mg by mouth daily.       . metFORMIN (GLUCOPHAGE) 1000 MG tablet Take 1,000 mg by mouth 2 (two) times daily with a meal.       . Multiple Vitamin (MULTIVITAMIN) tablet Take 1 tablet by mouth daily.        Marland Kitchen omeprazole (PRILOSEC OTC) 20 MG tablet Take 20 mg by mouth daily.        . potassium chloride SA (KLOR-CON M20) 20 MEQ tablet Take 1 tablet (20 mEq total) by mouth daily.  30 tablet  2  . pravastatin (PRAVACHOL) 80 MG tablet Take 1 tablet (80 mg total) by mouth daily.  90 tablet  3  . DISCONTD: carvedilol (COREG) 25 MG tablet Take 1 tablet (25 mg total) by mouth 2 (two) times daily with a meal.  180 tablet  3  . NITROSTAT 0.4 MG SL tablet Take 0.4 mg by mouth every 5 (five) minutes as needed.        No current facility-administered  medications for this visit.   Facility-Administered Medications Ordered in Other Visits  Medication Dose Route Frequency Provider Last Rate Last Dose  . 0.9 %  sodium chloride infusion   Intravenous Continuous [name], MD      . DISCONTD: 0.9 %  sodium chloride infusion   Intravenous Continuous [name], MD      . DISCONTD: acetaminophen (TYLENOL) tablet 650 mg  650 mg Oral Q4H PRN [name], MD      . DISCONTD: ondansetron [facility]) injection 4 mg  4 mg Intravenous Q6H PRN [name], MD        Allergies  Allergen Reactions  . Fenofibrate   . Lipitor (Atorvastatin Calcium)   . Morphine And Related Itching    Review of Systems  Constitutional: Positive for activity change and fatigue. Negative for fever, chills, diaphoresis, appetite change and unexpected weight change.  HENT: Negative.   Eyes: Negative.   Respiratory: Positive for shortness of breath.   Cardiovascular: Positive for palpitations. Negative for chest pain and leg swelling.  Gastrointestinal: Negative.   Genitourinary: Positive for frequency.  Musculoskeletal: Positive for arthralgias.       Left calf claudication  Skin: Negative.   Neurological: Positive for numbness.       Diabetic neuropathy in feet  Hematological: Negative.   Psychiatric/Behavioral: Negative.     BP 145/90  Pulse 70  Resp 20  Ht 5' 10"  (1.778 m)  Wt 245 lb (111.131 kg)  BMI 35.15 kg/m2  SpO2 98% Physical Exam  Constitutional: He is oriented to person, place, and time. He appears well-developed and well-nourished.  HENT:  Head: Normocephalic and atraumatic.  Mouth/Throat: No oropharyngeal exudate.  Eyes: Conjunctivae and EOM are normal. Pupils are equal, round, and reactive to light. No scleral icterus.  Neck: Normal range of motion. Neck supple. No JVD present. No tracheal deviation present. No thyromegaly present.  Cardiovascular: Normal rate and normal heart sounds.   No murmur heard.      Irregularly  irregular rhythm  Pulmonary/Chest: Effort normal.  Abdominal: Soft. Bowel sounds are normal. He exhibits no distension and no mass.  There is no tenderness.  Musculoskeletal: He exhibits no edema.       Left total knee replacement scar.  Lymphadenopathy:    He has no cervical adenopathy.  Neurological: He is alert and oriented to person, place, and time. He has normal strength. No cranial nerve deficit or sensory deficit.  Skin: Skin is warm and dry.  Psychiatric: He has a normal mood and affect.     Diagnostic Tests:   Hemodynamics:    LV pressure: 167/14/26 Aortic pressure: 175/96  Angiography    Left Main: mild taper  Left anterior Descending: The LAD is moderately - heavely calcified.  There is a 90% stenosis in the proxomal LAD ( just prior to 1st diag)  followed by a 60-70% stenosis at the bifurcation of the LAD and 2nd diag.     Left Circumflex: moderate size vessel. 50% stenosis in mid vessel  Right Coronary Artery: large and dominate.  Minor luminal irregularities.  There is a 50 % stenosis at the take off of the posterior descending artery and a ~50% stenosis at the take off of the distal most posterior lateral vessel.  LV Gram: moderate LV dysfunction.  EF 40-50%.  Anterior akinesis.    Impression:  He has significant multivessel coronary disease with high-grade LAD and diagonal stenosis. He has progressive exertional dyspnea over the past few months but no chest discomfort, probably related to his diabetes. I agree that coronary bypass graft surgery is indicated for this patient. He has also had significant problems with recurrent atrial fibrillation and has not been on anticoagulation due to GI bleeding. I think he would benefit from a Maze procedure to try to maintain sinus rhythm. I would also plan to clip his left atrial appendage. I discussed the operative procedure with the patient and family including alternatives, benefits and risks; including but not limited  to bleeding, blood transfusion, infection, stroke, myocardial infarction, graft failure, heart block requiring a permanent pacemaker, recurrent or persistent atrial fibrillation, organ dysfunction, and death.  Luberta Mutter [place]chatton understands and agrees to proceed.    Plan:  Coronary artery bypass surgery and MAZE procedure next Thursday 06/14/11.

## 2011-06-08 ENCOUNTER — Other Ambulatory Visit: Payer: Self-pay

## 2011-06-08 ENCOUNTER — Inpatient Hospital Stay (HOSPITAL_COMMUNITY): Admission: RE | Admit: 2011-06-08 | Payer: Federal, State, Local not specified - PPO | Source: Ambulatory Visit

## 2011-06-08 DIAGNOSIS — I2510 Atherosclerotic heart disease of native coronary artery without angina pectoris: Secondary | ICD-10-CM

## 2011-06-12 ENCOUNTER — Encounter (HOSPITAL_COMMUNITY): Payer: Self-pay

## 2011-06-12 ENCOUNTER — Encounter (HOSPITAL_COMMUNITY)
Admission: RE | Admit: 2011-06-12 | Discharge: 2011-06-12 | Disposition: A | Discharge status: Home / Self Care | Payer: Federal, State, Local not specified - PPO | Source: Ambulatory Visit | Attending: Surgery | Admitting: Surgery

## 2011-06-12 ENCOUNTER — Ambulatory Visit (HOSPITAL_COMMUNITY)
Admission: RE | Admit: 2011-06-12 | Discharge: 2011-06-12 | Disposition: A | Discharge status: Home / Self Care | Payer: Federal, State, Local not specified - PPO | Source: Ambulatory Visit | Attending: Cardiothoracic Surgery | Admitting: Cardiothoracic Surgery

## 2011-06-12 ENCOUNTER — Other Ambulatory Visit (HOSPITAL_COMMUNITY): Payer: Self-pay

## 2011-06-12 ENCOUNTER — Inpatient Hospital Stay (HOSPITAL_COMMUNITY)
Admission: RE | Admit: 2011-06-12 | Discharge: 2011-06-12 | Disposition: A | Discharge status: Home / Self Care | Payer: Federal, State, Local not specified - PPO | Source: Ambulatory Visit | Attending: Surgery | Admitting: Surgery

## 2011-06-12 VITALS — BP 151/93 | HR 78 | Temp 97.0°F | Resp 20 | Ht 72.0 in | Wt 239.2 lb

## 2011-06-12 DIAGNOSIS — I2510 Atherosclerotic heart disease of native coronary artery without angina pectoris: Secondary | ICD-10-CM

## 2011-06-12 DIAGNOSIS — E119 Type 2 diabetes mellitus without complications: Secondary | ICD-10-CM | POA: Insufficient documentation

## 2011-06-12 DIAGNOSIS — Z01810 Encounter for preprocedural cardiovascular examination: Secondary | ICD-10-CM

## 2011-06-12 DIAGNOSIS — M79609 Pain in unspecified limb: Secondary | ICD-10-CM

## 2011-06-12 DIAGNOSIS — I10 Essential (primary) hypertension: Secondary | ICD-10-CM

## 2011-06-12 HISTORY — DX: Gastro-esophageal reflux disease without esophagitis: K21.9

## 2011-06-12 HISTORY — DX: Type 2 diabetes mellitus with diabetic neuropathy, unspecified: E11.40

## 2011-06-12 LAB — URINALYSIS, ROUTINE W REFLEX MICROSCOPIC
Bilirubin Urine: NEGATIVE
Glucose, UA: NEGATIVE mg/dL
Hgb urine dipstick: NEGATIVE
Specific Gravity, Urine: 1.012 (ref 1.005–1.030)
Urobilinogen, UA: 0.2 mg/dL (ref 0.0–1.0)

## 2011-06-12 LAB — URINE MICROSCOPIC-ADD ON

## 2011-06-12 LAB — SURGICAL PCR SCREEN: Staphylococcus aureus: NEGATIVE

## 2011-06-12 LAB — CBC
MCH: 22.2 pg — ABNORMAL LOW (ref 26.0–34.0)
MCV: 72.5 fL — ABNORMAL LOW (ref 78.0–100.0)
Platelets: 260 10*3/uL (ref 150–400)
RDW: 18.1 % — ABNORMAL HIGH (ref 11.5–15.5)

## 2011-06-12 LAB — BLOOD GAS, ARTERIAL
Acid-base deficit: 0.5 mmol/L (ref 0.0–2.0)
Drawn by: 181601
O2 Saturation: 97.7 %
Patient temperature: 98.6

## 2011-06-12 LAB — APTT: aPTT: 29 seconds (ref 24–37)

## 2011-06-12 LAB — COMPREHENSIVE METABOLIC PANEL
AST: 29 U/L (ref 0–37)
Albumin: 3.3 g/dL — ABNORMAL LOW (ref 3.5–5.2)
CO2: 22 mEq/L (ref 19–32)
Calcium: 9 mg/dL (ref 8.4–10.5)
Creatinine, Ser: 1.07 mg/dL (ref 0.50–1.35)
GFR calc non Af Amer: 69 mL/min — ABNORMAL LOW (ref >90)

## 2011-06-12 LAB — HEMOGLOBIN A1C
Hgb A1c MFr Bld: 6.6 % — ABNORMAL HIGH (ref <5.7)
Mean Plasma Glucose: 143 mg/dL — ABNORMAL HIGH (ref <117)

## 2011-06-12 LAB — TYPE AND SCREEN: Antibody Screen: NEGATIVE

## 2011-06-12 LAB — PROTIME-INR: Prothrombin Time: 13.4 seconds (ref 11.6–15.2)

## 2011-06-12 LAB — PULMONARY FUNCTION TEST

## 2011-06-12 MED ORDER — ALBUTEROL SULFATE (5 MG/ML) 0.5% IN NEBU
2.5000 mg | INHALATION_SOLUTION | Freq: Once | RESPIRATORY_TRACT | Status: AC
  Administered 2011-06-12: 2.5 mg via RESPIRATORY_TRACT

## 2011-06-12 NOTE — Pre-Procedure Instructions (Signed)
20 Edward [name]  06/12/2011   Your procedure is scheduled on:  Thursday May 30  Report to [place] at 5:30 AM.  Call this number if you have problems the morning of surgery: [phone]   Remember:   Do not eat food:After Midnight.  May have clear liquids: up to 4 Hours before arrival.  Clear liquids include soda, tea, black coffee, apple or grape juice, broth.  Take these medicines the morning of surgery with A SIP OF WATER: Amlodipine, Carvedilol, Flecainide, Prilosec   Do not wear jewelry, make-up or nail polish.  Do not wear lotions, powders, or perfumes. You may wear deodorant.  Do not shave 48 hours prior to surgery. Men may shave face and neck.  Do not bring valuables to the hospital.  Contacts, dentures or bridgework may not be worn into surgery.  Leave suitcase in the car. After surgery it may be brought to your room.  For patients admitted to the hospital, checkout time is 11:00 AM the day of discharge.   Patients discharged the day of surgery will not be allowed to drive home.  Name and phone number of your driver: NA  Special Instructions: Incentive Spirometry - Practice and bring it with you on the day of surgery. and CHG Shower Use Special Wash: 1/2 bottle night before surgery and 1/2 bottle morning of surgery.   Please read over the following fact sheets that you were given: Pain Booklet, Coughing and Deep Breathing, Blood Transfusion Information, Open Heart Packet and Surgical Site Infection Prevention

## 2011-06-12 NOTE — Progress Notes (Signed)
VASCULAR LAB PRELIMINARY  PRELIMINARY  PRELIMINARY  PRELIMINARY  Pre-op Cardiac Surgery  Carotid Findings:  No evidence of significant ICA stenosis. Right vertebral flow is "TO - FRO" Left is antegrade.  Upper Extremity Right Left  Brachial Pressures 159 Triphasic 166 Triphasic  Radial Waveforms Triphasic Triphasic  Ulnar Waveforms Triphasic Triphasic  Palmar Arch (Allen's Test) Normal Normal   Findings:  Doppler waveforms remained normal bilaterally with both radial and ulnar compressions173    Lower  Extremity Right Left  Dorsalis Pedis 173 Triphasic 91 Monophasic      Posterior Tibial 195 Triphasic 110 Monophasic  Ankle/Brachial Indices 1.17 0.66    Findings:  Right ABI indicates normal arterial flow at rest. Left ABI indicates a moderate reduction in arterial flow at rest.   Clancy Leiner D, RVS  06/12/2011, 2:05 PM

## 2011-06-13 MED ORDER — TRANEXAMIC ACID (OHS) BOLUS VIA INFUSION: 15.0000 mg/kg | INTRAVENOUS | Status: DC

## 2011-06-13 MED ORDER — DOPAMINE-DEXTROSE 3.2-5 MG/ML-% IV SOLN: 2.0000 ug/kg/min | INTRAVENOUS | Status: DC

## 2011-06-13 MED ORDER — SODIUM BICARBONATE 8.4 % IV SOLN
INTRAVENOUS | Status: AC
  Filled 2011-06-13 (×2): qty 2.5

## 2011-06-13 MED ORDER — SODIUM BICARBONATE 8.4 % IV SOLN
INTRAVENOUS | Status: AC
  Filled 2011-06-13: qty 2.5

## 2011-06-13 MED ORDER — SODIUM CHLORIDE 0.9 % IV SOLN: 0.1000 ug/kg/h | INTRAVENOUS | Status: DC

## 2011-06-13 MED ORDER — TRANEXAMIC ACID 100 MG/ML IV SOLN
1.5000 mg/kg/h | INTRAVENOUS | Status: AC
  Administered 2011-06-14: 1.5 mg/kg/h via INTRAVENOUS
  Administered 2011-06-14: 14:00:00 via INTRAVENOUS
  Filled 2011-06-13: qty 25

## 2011-06-13 MED ORDER — MAGNESIUM SULFATE 50 % IJ SOLN
40.0000 meq | INTRAMUSCULAR | Status: AC
  Filled 2011-06-13: qty 10

## 2011-06-13 MED ORDER — EPINEPHRINE HCL 1 MG/ML IJ SOLN
0.5000 ug/min | INTRAVENOUS | Status: DC
  Filled 2011-06-13: qty 4

## 2011-06-13 MED ORDER — POTASSIUM CHLORIDE 2 MEQ/ML IV SOLN
80.0000 meq | INTRAVENOUS | Status: AC
  Filled 2011-06-13 (×2): qty 40

## 2011-06-13 MED ORDER — TRANEXAMIC ACID (OHS) PUMP PRIME SOLUTION
2.0000 mg/kg | INTRAVENOUS | Status: DC
  Filled 2011-06-13 (×2): qty 2.17

## 2011-06-13 MED ORDER — DEXTROSE 5 % IV SOLN
750.0000 mg | INTRAVENOUS | Status: AC
  Filled 2011-06-13: qty 750

## 2011-06-13 MED ORDER — DEXTROSE 5 % IV SOLN
1.5000 g | INTRAVENOUS | Status: AC
  Administered 2011-06-14: .75 g via INTRAVENOUS
  Administered 2011-06-14: 1.5 g via INTRAVENOUS
  Filled 2011-06-13: qty 1.5

## 2011-06-13 MED ORDER — NITROGLYCERIN IN D5W 200-5 MCG/ML-% IV SOLN
2.0000 ug/min | INTRAVENOUS | Status: AC
  Administered 2011-06-14: 16.67 ug/min via INTRAVENOUS
  Filled 2011-06-13: qty 250

## 2011-06-13 MED ORDER — NITROGLYCERIN IN D5W 200-5 MCG/ML-% IV SOLN
2.0000 ug/min | INTRAVENOUS | Status: AC
  Filled 2011-06-13: qty 250

## 2011-06-13 MED ORDER — PHENYLEPHRINE HCL 10 MG/ML IJ SOLN
30.0000 ug/min | INTRAVENOUS | Status: AC
  Administered 2011-06-14: 20 ug/min via INTRAVENOUS
  Filled 2011-06-13: qty 2

## 2011-06-13 MED ORDER — PHENYLEPHRINE HCL 10 MG/ML IJ SOLN
30.0000 ug/min | INTRAVENOUS | Status: AC
  Filled 2011-06-13: qty 2

## 2011-06-13 MED ORDER — VANCOMYCIN HCL 1000 MG IV SOLR
1500.0000 mg | INTRAVENOUS | Status: AC
  Administered 2011-06-14: 1500 mg via INTRAVENOUS
  Filled 2011-06-13 (×2): qty 1500

## 2011-06-13 MED ORDER — TRANEXAMIC ACID 100 MG/ML IV SOLN: 1.5000 mg/kg/h | INTRAVENOUS | Status: DC

## 2011-06-13 MED ORDER — MAGNESIUM SULFATE 50 % IJ SOLN
40.0000 meq | INTRAMUSCULAR | Status: DC
  Filled 2011-06-13 (×2): qty 10

## 2011-06-13 MED ORDER — SODIUM CHLORIDE 0.9 % IV SOLN
0.1000 ug/kg/h | INTRAVENOUS | Status: AC
  Administered 2011-06-14: .2 ug/kg/h via INTRAVENOUS
  Filled 2011-06-13: qty 4

## 2011-06-13 MED ORDER — TRANEXAMIC ACID (OHS) BOLUS VIA INFUSION
15.0000 mg/kg | INTRAVENOUS | Status: AC
  Administered 2011-06-14: 1626 mg via INTRAVENOUS
  Filled 2011-06-13: qty 1626

## 2011-06-13 MED ORDER — EPINEPHRINE HCL 1 MG/ML IJ SOLN
0.5000 ug/min | INTRAVENOUS | Status: AC
  Filled 2011-06-13: qty 4

## 2011-06-13 MED ORDER — SODIUM CHLORIDE 0.9 % IV SOLN
INTRAVENOUS | Status: AC
  Filled 2011-06-13: qty 1

## 2011-06-13 MED ORDER — TRANEXAMIC ACID (OHS) PUMP PRIME SOLUTION: 2.0000 mg/kg | INTRAVENOUS | Status: DC

## 2011-06-13 MED ORDER — SODIUM CHLORIDE 0.9 % IV SOLN
INTRAVENOUS | Status: AC
  Administered 2011-06-14: 4.4 [IU]/h via INTRAVENOUS
  Administered 2011-06-14: 1 [IU]/h via INTRAVENOUS
  Filled 2011-06-13: qty 1

## 2011-06-13 MED ORDER — POTASSIUM CHLORIDE 2 MEQ/ML IV SOLN
80.0000 meq | INTRAVENOUS | Status: DC
  Filled 2011-06-13: qty 40

## 2011-06-13 MED ORDER — METOPROLOL TARTRATE 12.5 MG HALF TABLET: 12.5000 mg | ORAL_TABLET | Freq: Once | ORAL | Status: DC

## 2011-06-13 MED ORDER — CHLORHEXIDINE GLUCONATE 4 % EX LIQD: 30.0000 mL | CUTANEOUS | Status: DC

## 2011-06-13 MED ORDER — DOPAMINE-DEXTROSE 3.2-5 MG/ML-% IV SOLN
2.0000 ug/kg/min | INTRAVENOUS | Status: DC
  Filled 2011-06-13: qty 250

## 2011-06-13 NOTE — H&P (Signed)
[address]            [place] [address]          [phone]      PCP is GUEST, Veneda Melter, MD, MD Referring Provider is Nahser, Wonda Cheng, MD    Chief Complaint   Patient presents with   .  Coronary Artery Disease       Referral from Dr Acie Fredrickson for eval eval on CAD, Cardiac Cath 06/05/11   .  Atrial Fibrillation        HPI:   The patient is a 71 year old gentleman with a several year history of recurrent atrial fibrillation treated with antiarrhythmic therapy and multiple cardioversions who has not been maintained on anticoagulation due to GI bleeding. He had upper and lower endoscopy which did not show any obvious bleeding site although he did have diverticulosis of the colon. He now presents with a several month history of exertional dyspnea and generalized fatigue that seems to be getting worse. He was found to be back in atrial fibrillation. His last Myoview examination was felt to be a low risk study given his symptoms he underwent cardiac catheterization which showed significant multivessel coronary disease with a 90% proximal and 70% mid LAD stenosis compromising 2 diagonal branches as well as moderate disease in the left circumflex and dominant right coronary circulation. Left ventricular ejection fraction of 40-50% with anterior akinesis.    Past Medical History   Diagnosis  Date   .  A-fib     .  HTN (hypertension)     .  Diabetes mellitus     .  Dyslipidemia     .  PVD (peripheral vascular disease)         s/p Right CEA March, 2011,    .  Claudication     .  Hyperlipidemia           Past Surgical History   Procedure  Date   .  Carotid endarterectomy         RIGHT   .  Cholecystectomy     .  Tonsillectomy and adenoidectomy     .  Knee surgery           Family History   Problem  Relation  Age of Onset   .  Coronary artery disease  Father     .  Hypertension  Father     .  Heart attack  Mother     .  Coronary  artery disease  Mother     .  Hypertension  Sister          Social History History   Substance Use Topics   .  Smoking status:  Former Smoker       Types:  Cigarettes       Quit date:  01/16/1979   .  Smokeless tobacco:  Never Used   .  Alcohol Use:  No         OCC.         Current Outpatient Prescriptions   Medication  Sig  Dispense  Refill   .  amLODipine (NORVASC) 5 MG tablet  Take 1 tablet (5 mg total) by mouth daily.   180 tablet   3   .  aspirin 81 MG tablet  Take 81 mg by mouth daily.         Marland Kitchen  Calcium Carbonate-Vitamin D (CALCIUM + D PO)  Take 1,200 mg by mouth daily.           .  carvedilol (COREG) 25 MG tablet  Take 25 mg by mouth 1 day or 1 dose.         .  Coenzyme Q10 (COQ10) 200 MG CAPS  Take 1 capsule by mouth daily.           Marland Kitchen  exenatide (BYETTA 10 MCG PEN) 10 MCG/0.04ML SOLN  Inject 10 mcg into the skin daily.    1.2 mL      .  flecainide (TAMBOCOR) 100 MG tablet  Take 1.5 tablets (150 mg total) by mouth 2 (two) times daily.   270 tablet   1   .  furosemide (LASIX) 40 MG tablet  Take 1 tablet (40 mg total) by mouth daily.   30 tablet   2   .  isosorbide mononitrate (IMDUR) 30 MG 24 hr tablet  Take 60 mg by mouth as needed.          .  loratadine (CLARITIN) 10 MG tablet  Take 1 tablet (10 mg total) by mouth daily.         Marland Kitchen  losartan (COZAAR) 100 MG tablet  Take 100 mg by mouth daily.          .  Magnesium 500 MG CAPS  Take 250 mg by mouth daily.          .  metFORMIN (GLUCOPHAGE) 1000 MG tablet  Take 1,000 mg by mouth 2 (two) times daily with a meal.          .  Multiple Vitamin (MULTIVITAMIN) tablet  Take 1 tablet by mouth daily.           Marland Kitchen  omeprazole (PRILOSEC OTC) 20 MG tablet  Take 20 mg by mouth daily.           .  potassium chloride SA (KLOR-CON M20) 20 MEQ tablet  Take 1 tablet (20 mEq total) by mouth daily.   30 tablet   2   .  pravastatin (PRAVACHOL) 80 MG tablet  Take 1 tablet (80 mg total) by mouth daily.   90 tablet   3   .  DISCONTD: carvedilol  (COREG) 25 MG tablet  Take 1 tablet (25 mg total) by mouth 2 (two) times daily with a meal.   180 tablet   3   .  NITROSTAT 0.4 MG SL tablet  Take 0.4 mg by mouth every 5 (five) minutes as needed.              No current facility-administered medications for this visit.       Facility-Administered Medications Ordered in Other Visits   Medication  Dose  Route  Frequency  Provider  Last Rate  Last Dose   .  0.9 %  sodium chloride infusion     Intravenous  Continuous  [name], MD         .  DISCONTD: 0.9 %  sodium chloride infusion     Intravenous  Continuous  [name], MD         .  DISCONTD: acetaminophen (TYLENOL) tablet 650 mg   650 mg  Oral  Q4H PRN  [name], MD         .  DISCONTD: ondansetron [facility]) injection 4 mg   4 mg  Intravenous  Q6H PRN  [name], MD  Allergies   Allergen  Reactions   .  Fenofibrate     .  Lipitor (Atorvastatin Calcium)     .  Morphine And Related  Itching        Review of Systems  Constitutional: Positive for activity change and fatigue. Negative for fever, chills, diaphoresis, appetite change and unexpected weight change.  HENT: Negative.   Eyes: Negative.   Respiratory: Positive for shortness of breath.   Cardiovascular: Positive for palpitations. Negative for chest pain and leg swelling.  Gastrointestinal: Negative.   Genitourinary: Positive for frequency.  Musculoskeletal: Positive for arthralgias.        Left calf claudication  Skin: Negative.   Neurological: Positive for numbness.        Diabetic neuropathy in feet  Hematological: Negative.   Psychiatric/Behavioral: Negative.       BP 145/90  Pulse 70  Resp 20  Ht 5' 10"  (1.778 m)  Wt 245 lb (111.131 kg)  BMI 35.15 kg/m2  SpO2 98% Physical Exam  Constitutional: He is oriented to person, place, and time. He appears well-developed and well-nourished.  HENT:   Head: Normocephalic and atraumatic.   Mouth/Throat: No oropharyngeal  exudate.  Eyes: Conjunctivae and EOM are normal. Pupils are equal, round, and reactive to light. No scleral icterus.  Neck: Normal range of motion. Neck supple. No JVD present. No tracheal deviation present. No thyromegaly present.  Cardiovascular: Normal rate and normal heart sounds.    No murmur heard.      Irregularly irregular rhythm  Pulmonary/Chest: Effort normal.  Abdominal: Soft. Bowel sounds are normal. He exhibits no distension and no mass. There is no tenderness.  Musculoskeletal: He exhibits no edema.       Left total knee replacement scar.  Lymphadenopathy:    He has no cervical adenopathy.  Neurological: He is alert and oriented to person, place, and time. He has normal strength. No cranial nerve deficit or sensory deficit.  Skin: Skin is warm and dry.  Psychiatric: He has a normal mood and affect.        Diagnostic Tests:     Hemodynamics:     LV pressure: 167/14/26 Aortic pressure: 175/96   Angiography     Left Main: mild taper   Left anterior Descending: The LAD is moderately - heavely calcified.  There is a 90% stenosis in the proxomal LAD ( just prior to 1st diag)  followed by a 60-70% stenosis at the bifurcation of the LAD and 2nd diag.      Left Circumflex: moderate size vessel. 50% stenosis in mid vessel   Right Coronary Artery: large and dominate.  Minor luminal irregularities.  There is a 50 % stenosis at the take off of the posterior descending artery and a ~50% stenosis at the take off of the distal most posterior lateral vessel.   LV Gram: moderate LV dysfunction.  EF 40-50%.  Anterior akinesis.       Impression:   He has significant multivessel coronary disease with high-grade LAD and diagonal stenosis. He has progressive exertional dyspnea over the past few months but no chest discomfort, probably related to his diabetes. I agree that coronary bypass graft surgery is indicated for this patient. He has also had significant problems  with recurrent atrial fibrillation and has not been on anticoagulation due to GI bleeding. I think he would benefit from a Maze procedure to try to maintain sinus rhythm. I would also plan to clip his left atrial appendage. I discussed the  operative procedure with the patient and family including alternatives, benefits and risks; including but not limited to bleeding, blood transfusion, infection, stroke, myocardial infarction, graft failure, heart block requiring a permanent pacemaker, recurrent or persistent atrial fibrillation, organ dysfunction, and death.  Edward [name] understands and agrees to proceed.     Plan:   Coronary artery bypass surgery and MAZE procedure next Thursday 06/14/11.

## 2011-06-13 NOTE — Progress Notes (Signed)
Preliminary doppler report under notes, V. Slaughter ,Tech., reports entire study  was completed.

## 2011-06-14 ENCOUNTER — Inpatient Hospital Stay (HOSPITAL_COMMUNITY)
Admission: RE | Admit: 2011-06-14 | Discharge: 2011-06-22 | DRG: 229 | Disposition: A | Discharge status: Home / Self Care | Payer: Medicare Other | Source: Ambulatory Visit | Attending: Surgery | Admitting: Surgery

## 2011-06-14 ENCOUNTER — Encounter (HOSPITAL_COMMUNITY): Payer: Self-pay | Admitting: Certified Registered"

## 2011-06-14 ENCOUNTER — Encounter (HOSPITAL_COMMUNITY): Payer: Self-pay

## 2011-06-14 ENCOUNTER — Ambulatory Visit (HOSPITAL_COMMUNITY): Payer: Medicare Other | Admitting: Certified Registered"

## 2011-06-14 ENCOUNTER — Inpatient Hospital Stay (HOSPITAL_COMMUNITY): Payer: Medicare Other

## 2011-06-14 ENCOUNTER — Encounter (HOSPITAL_COMMUNITY)
Admission: RE | Disposition: A | Discharge status: Home / Self Care | Payer: Self-pay | Source: Ambulatory Visit | Attending: Surgery

## 2011-06-14 DIAGNOSIS — Z8249 Family history of ischemic heart disease and other diseases of the circulatory system: Secondary | ICD-10-CM

## 2011-06-14 DIAGNOSIS — D62 Acute posthemorrhagic anemia: Secondary | ICD-10-CM | POA: Diagnosis not present

## 2011-06-14 DIAGNOSIS — E119 Type 2 diabetes mellitus without complications: Secondary | ICD-10-CM | POA: Diagnosis present

## 2011-06-14 DIAGNOSIS — I447 Left bundle-branch block, unspecified: Secondary | ICD-10-CM | POA: Diagnosis present

## 2011-06-14 DIAGNOSIS — E8779 Other fluid overload: Secondary | ICD-10-CM | POA: Diagnosis not present

## 2011-06-14 DIAGNOSIS — Z8679 Personal history of other diseases of the circulatory system: Secondary | ICD-10-CM

## 2011-06-14 DIAGNOSIS — Z950 Presence of cardiac pacemaker: Secondary | ICD-10-CM

## 2011-06-14 DIAGNOSIS — I2510 Atherosclerotic heart disease of native coronary artery without angina pectoris: Secondary | ICD-10-CM

## 2011-06-14 DIAGNOSIS — E785 Hyperlipidemia, unspecified: Secondary | ICD-10-CM | POA: Diagnosis present

## 2011-06-14 DIAGNOSIS — I480 Paroxysmal atrial fibrillation: Secondary | ICD-10-CM | POA: Insufficient documentation

## 2011-06-14 DIAGNOSIS — Z951 Presence of aortocoronary bypass graft: Secondary | ICD-10-CM

## 2011-06-14 DIAGNOSIS — Z9889 Other specified postprocedural states: Secondary | ICD-10-CM

## 2011-06-14 DIAGNOSIS — I70219 Atherosclerosis of native arteries of extremities with intermittent claudication, unspecified extremity: Secondary | ICD-10-CM | POA: Diagnosis present

## 2011-06-14 DIAGNOSIS — D649 Anemia, unspecified: Secondary | ICD-10-CM | POA: Diagnosis present

## 2011-06-14 DIAGNOSIS — I10 Essential (primary) hypertension: Secondary | ICD-10-CM | POA: Diagnosis present

## 2011-06-14 DIAGNOSIS — Z9089 Acquired absence of other organs: Secondary | ICD-10-CM

## 2011-06-14 DIAGNOSIS — Z87891 Personal history of nicotine dependence: Secondary | ICD-10-CM

## 2011-06-14 DIAGNOSIS — I4891 Unspecified atrial fibrillation: Secondary | ICD-10-CM

## 2011-06-14 DIAGNOSIS — I498 Other specified cardiac arrhythmias: Secondary | ICD-10-CM | POA: Diagnosis not present

## 2011-06-14 DIAGNOSIS — I495 Sick sinus syndrome: Secondary | ICD-10-CM | POA: Diagnosis not present

## 2011-06-14 HISTORY — PX: CORONARY ARTERY BYPASS GRAFT: SHX141

## 2011-06-14 HISTORY — PX: MAZE: SHX5063

## 2011-06-14 LAB — POCT I-STAT, CHEM 8
Chloride: 107 mEq/L (ref 96–112)
HCT: 41 % (ref 39.0–52.0)
Hemoglobin: 13.9 g/dL (ref 13.0–17.0)
Potassium: 4.5 mEq/L (ref 3.5–5.1)
Sodium: 139 mEq/L (ref 135–145)

## 2011-06-14 LAB — CBC
HCT: 38.8 % — ABNORMAL LOW (ref 39.0–52.0)
Hemoglobin: 12.3 g/dL — ABNORMAL LOW (ref 13.0–17.0)
Hemoglobin: 12.5 g/dL — ABNORMAL LOW (ref 13.0–17.0)
MCH: 22.7 pg — ABNORMAL LOW (ref 26.0–34.0)
MCH: 22.8 pg — ABNORMAL LOW (ref 26.0–34.0)
MCV: 71.5 fL — ABNORMAL LOW (ref 78.0–100.0)
MCV: 71.4 fL — ABNORMAL LOW (ref 78.0–100.0)
Platelets: 203 10*3/uL (ref 150–400)
RBC: 5.43 MIL/uL (ref 4.22–5.81)
RBC: 5.49 MIL/uL (ref 4.22–5.81)
WBC: 21.7 10*3/uL — ABNORMAL HIGH (ref 4.0–10.5)

## 2011-06-14 LAB — CREATININE, SERUM
GFR calc Af Amer: 60 mL/min — ABNORMAL LOW (ref >90)
GFR calc non Af Amer: 52 mL/min — ABNORMAL LOW (ref >90)

## 2011-06-14 LAB — POCT I-STAT 4, (NA,K, GLUC, HGB,HCT)
HCT: 40 % (ref 39.0–52.0)
Hemoglobin: 13.6 g/dL (ref 13.0–17.0)
Potassium: 4.4 mEq/L (ref 3.5–5.1)
Sodium: 138 mEq/L (ref 135–145)

## 2011-06-14 LAB — POCT I-STAT 3, ART BLOOD GAS (G3+)
Acid-base deficit: 3 mmol/L — ABNORMAL HIGH (ref 0.0–2.0)
Acid-base deficit: 4 mmol/L — ABNORMAL HIGH (ref 0.0–2.0)
O2 Saturation: 93 %
Patient temperature: 36.3
Patient temperature: 36.9
pH, Arterial: 7.338 — ABNORMAL LOW (ref 7.350–7.450)
pO2, Arterial: 58 mmHg — ABNORMAL LOW (ref 80.0–100.0)

## 2011-06-14 LAB — GLUCOSE, CAPILLARY: Glucose-Capillary: 160 mg/dL — ABNORMAL HIGH (ref 70–99)

## 2011-06-14 LAB — MAGNESIUM: Magnesium: 3.2 mg/dL — ABNORMAL HIGH (ref 1.5–2.5)

## 2011-06-14 SURGERY — CORONARY ARTERY BYPASS GRAFTING (CABG)
Anesthesia: General | Site: Chest | Wound class: Clean

## 2011-06-14 MED ORDER — ALBUMIN HUMAN 5 % IV SOLN
250.0000 mL | INTRAVENOUS | Status: AC | PRN
  Administered 2011-06-14: 250 mL via INTRAVENOUS

## 2011-06-14 MED ORDER — ONDANSETRON HCL 4 MG/2ML IJ SOLN: 4.0000 mg | Freq: Four times a day (QID) | INTRAMUSCULAR | Status: DC | PRN

## 2011-06-14 MED ORDER — PANTOPRAZOLE SODIUM 40 MG PO TBEC
40.0000 mg | DELAYED_RELEASE_TABLET | Freq: Every day | ORAL | Status: DC
  Administered 2011-06-16 – 2011-06-17 (×2): 40 mg via ORAL
  Filled 2011-06-14 (×2): qty 1

## 2011-06-14 MED ORDER — NITROGLYCERIN IN D5W 200-5 MCG/ML-% IV SOLN: 0.0000 ug/min | INTRAVENOUS | Status: DC

## 2011-06-14 MED ORDER — HEMOSTATIC AGENTS (NO CHARGE) OPTIME
TOPICAL | Status: DC | PRN
  Administered 2011-06-14: 1 via TOPICAL

## 2011-06-14 MED ORDER — POTASSIUM CHLORIDE 10 MEQ/50ML IV SOLN: 10.0000 meq | INTRAVENOUS | Status: AC

## 2011-06-14 MED ORDER — ALBUMIN HUMAN 5 % IV SOLN
INTRAVENOUS | Status: DC | PRN
  Administered 2011-06-14: 16:00:00 via INTRAVENOUS

## 2011-06-14 MED ORDER — TRANEXAMIC ACID 100 MG/ML IV SOLN
1.5000 mg/kg/h | INTRAVENOUS | Status: AC
  Filled 2011-06-14 (×2): qty 25

## 2011-06-14 MED ORDER — PROTAMINE SULFATE 10 MG/ML IV SOLN
INTRAVENOUS | Status: DC | PRN
  Administered 2011-06-14 (×2): 50 mg via INTRAVENOUS
  Administered 2011-06-14: 30 mg via INTRAVENOUS
  Administered 2011-06-14 (×2): 20 mg via INTRAVENOUS
  Administered 2011-06-14: 30 mg via INTRAVENOUS
  Administered 2011-06-14: 20 mg via INTRAVENOUS

## 2011-06-14 MED ORDER — MORPHINE SULFATE 2 MG/ML IJ SOLN
1.0000 mg | INTRAMUSCULAR | Status: AC | PRN
  Administered 2011-06-14 (×2): 2 mg via INTRAVENOUS
  Filled 2011-06-14 (×3): qty 1

## 2011-06-14 MED ORDER — ACETAMINOPHEN 500 MG PO TABS
1000.0000 mg | ORAL_TABLET | Freq: Four times a day (QID) | ORAL | Status: DC
  Administered 2011-06-15 – 2011-06-18 (×13): 1000 mg via ORAL
  Filled 2011-06-14 (×17): qty 2

## 2011-06-14 MED ORDER — FAMOTIDINE IN NACL 20-0.9 MG/50ML-% IV SOLN
20.0000 mg | Freq: Two times a day (BID) | INTRAVENOUS | Status: AC
  Administered 2011-06-14 (×2): 20 mg via INTRAVENOUS
  Filled 2011-06-14: qty 50

## 2011-06-14 MED ORDER — ASPIRIN EC 325 MG PO TBEC
325.0000 mg | DELAYED_RELEASE_TABLET | Freq: Every day | ORAL | Status: DC
  Filled 2011-06-14: qty 1

## 2011-06-14 MED ORDER — BISACODYL 5 MG PO TBEC
10.0000 mg | DELAYED_RELEASE_TABLET | Freq: Every day | ORAL | Status: DC
  Administered 2011-06-15 – 2011-06-17 (×3): 10 mg via ORAL
  Filled 2011-06-14 (×3): qty 2

## 2011-06-14 MED ORDER — SODIUM CHLORIDE 0.9 % IJ SOLN: 3.0000 mL | INTRAMUSCULAR | Status: DC | PRN

## 2011-06-14 MED ORDER — DEXTROSE 5 % IV SOLN
1.5000 g | Freq: Two times a day (BID) | INTRAVENOUS | Status: AC
  Administered 2011-06-14 – 2011-06-16 (×4): 1.5 g via INTRAVENOUS
  Filled 2011-06-14 (×4): qty 1.5

## 2011-06-14 MED ORDER — METOPROLOL TARTRATE 1 MG/ML IV SOLN: 2.5000 mg | INTRAVENOUS | Status: DC | PRN

## 2011-06-14 MED ORDER — MORPHINE SULFATE 2 MG/ML IJ SOLN
2.0000 mg | INTRAMUSCULAR | Status: DC | PRN
  Administered 2011-06-14 – 2011-06-18 (×7): 2 mg via INTRAVENOUS
  Filled 2011-06-14 (×6): qty 1

## 2011-06-14 MED ORDER — ACETAMINOPHEN 160 MG/5ML PO SOLN: 650.0000 mg | ORAL | Status: AC

## 2011-06-14 MED ORDER — HEPARIN SODIUM (PORCINE) 1000 UNIT/ML IJ SOLN
INTRAMUSCULAR | Status: DC | PRN
  Administered 2011-06-14: 43000 [IU] via INTRAVENOUS

## 2011-06-14 MED ORDER — ROCURONIUM BROMIDE 100 MG/10ML IV SOLN
INTRAVENOUS | Status: DC | PRN
  Administered 2011-06-14: 50 mg via INTRAVENOUS

## 2011-06-14 MED ORDER — PROPOFOL 10 MG/ML IV EMUL
INTRAVENOUS | Status: DC | PRN
  Administered 2011-06-14: 130 mg via INTRAVENOUS

## 2011-06-14 MED ORDER — FENTANYL CITRATE 0.05 MG/ML IJ SOLN
INTRAMUSCULAR | Status: DC | PRN
  Administered 2011-06-14: 300 ug via INTRAVENOUS
  Administered 2011-06-14 (×2): 200 ug via INTRAVENOUS
  Administered 2011-06-14 (×3): 100 ug via INTRAVENOUS

## 2011-06-14 MED ORDER — THROMBIN 20000 UNITS EX KIT
PACK | OROMUCOSAL | Status: DC | PRN
  Administered 2011-06-14: 09:00:00 via TOPICAL

## 2011-06-14 MED ORDER — SODIUM CHLORIDE 0.9 % IV SOLN: 250.0000 mL | INTRAVENOUS | Status: DC

## 2011-06-14 MED ORDER — BISACODYL 10 MG RE SUPP: 10.0000 mg | Freq: Every day | RECTAL | Status: DC

## 2011-06-14 MED ORDER — ACETAMINOPHEN 650 MG RE SUPP
650.0000 mg | RECTAL | Status: AC
  Administered 2011-06-14: 650 mg via RECTAL

## 2011-06-14 MED ORDER — METOPROLOL TARTRATE 12.5 MG HALF TABLET
12.5000 mg | ORAL_TABLET | Freq: Two times a day (BID) | ORAL | Status: DC
  Filled 2011-06-14 (×3): qty 1

## 2011-06-14 MED ORDER — DOPAMINE-DEXTROSE 3.2-5 MG/ML-% IV SOLN
2.0000 ug/kg/min | INTRAVENOUS | Status: DC
  Administered 2011-06-15: 5 ug/kg/min via INTRAVENOUS
  Filled 2011-06-14: qty 250

## 2011-06-14 MED ORDER — OXYCODONE HCL 5 MG PO TABS
5.0000 mg | ORAL_TABLET | ORAL | Status: DC | PRN
  Administered 2011-06-15 (×2): 10 mg via ORAL
  Administered 2011-06-15: 5 mg via ORAL
  Administered 2011-06-15 – 2011-06-16 (×3): 10 mg via ORAL
  Administered 2011-06-16: 5 mg via ORAL
  Administered 2011-06-16 (×3): 10 mg via ORAL
  Administered 2011-06-17 – 2011-06-18 (×6): 5 mg via ORAL
  Administered 2011-06-18: 10 mg via ORAL
  Filled 2011-06-14 (×3): qty 2
  Filled 2011-06-14: qty 1
  Filled 2011-06-14: qty 2
  Filled 2011-06-14: qty 1
  Filled 2011-06-14: qty 2
  Filled 2011-06-14: qty 1
  Filled 2011-06-14: qty 2
  Filled 2011-06-14 (×2): qty 1
  Filled 2011-06-14: qty 2
  Filled 2011-06-14: qty 1
  Filled 2011-06-14 (×2): qty 2
  Filled 2011-06-14 (×2): qty 1
  Filled 2011-06-14: qty 2

## 2011-06-14 MED ORDER — SODIUM CHLORIDE 0.9 % IV SOLN
INTRAVENOUS | Status: DC
  Filled 2011-06-14: qty 1

## 2011-06-14 MED ORDER — PHENYLEPHRINE HCL 10 MG/ML IJ SOLN
0.0000 ug/min | INTRAMUSCULAR | Status: DC
  Administered 2011-06-15: 35 ug/min via INTRAVENOUS
  Filled 2011-06-14 (×2): qty 2

## 2011-06-14 MED ORDER — SODIUM CHLORIDE 0.45 % IV SOLN
INTRAVENOUS | Status: DC
  Administered 2011-06-14: 17:00:00 via INTRAVENOUS

## 2011-06-14 MED ORDER — MIDAZOLAM HCL 2 MG/2ML IJ SOLN: 2.0000 mg | INTRAMUSCULAR | Status: DC | PRN

## 2011-06-14 MED ORDER — SODIUM CHLORIDE 0.9 % IV SOLN
0.4000 ug/kg/h | INTRAVENOUS | Status: DC
  Filled 2011-06-14: qty 4

## 2011-06-14 MED ORDER — SODIUM CHLORIDE 0.9 % IV SOLN
0.1000 ug/kg/h | INTRAVENOUS | Status: DC
  Filled 2011-06-14: qty 2

## 2011-06-14 MED ORDER — DOCUSATE SODIUM 100 MG PO CAPS
200.0000 mg | ORAL_CAPSULE | Freq: Every day | ORAL | Status: DC
  Administered 2011-06-15 – 2011-06-17 (×3): 200 mg via ORAL
  Filled 2011-06-14 (×3): qty 2

## 2011-06-14 MED ORDER — LACTATED RINGERS IV SOLN: 500.0000 mL | Freq: Once | INTRAVENOUS | Status: AC | PRN

## 2011-06-14 MED ORDER — DOPAMINE-DEXTROSE 1.6-5 MG/ML-% IV SOLN
INTRAVENOUS | Status: DC | PRN
  Administered 2011-06-14: 3 ug/kg/min via INTRAVENOUS

## 2011-06-14 MED ORDER — THROMBIN 20000 UNITS EX SOLR
CUTANEOUS | Status: DC | PRN
  Administered 2011-06-14: 20000 [IU] via TOPICAL

## 2011-06-14 MED ORDER — SODIUM CHLORIDE 0.9 % IV SOLN: INTRAVENOUS | Status: DC

## 2011-06-14 MED ORDER — MIDAZOLAM HCL 5 MG/5ML IJ SOLN
INTRAMUSCULAR | Status: DC | PRN
  Administered 2011-06-14: 3 mg via INTRAVENOUS
  Administered 2011-06-14: 7 mg via INTRAVENOUS
  Administered 2011-06-14: 2 mg via INTRAVENOUS
  Administered 2011-06-14 (×2): 1 mg via INTRAVENOUS

## 2011-06-14 MED ORDER — MAGNESIUM SULFATE 40 MG/ML IJ SOLN
4.0000 g | Freq: Once | INTRAMUSCULAR | Status: AC
  Administered 2011-06-14: 4 g via INTRAVENOUS
  Filled 2011-06-14: qty 100

## 2011-06-14 MED ORDER — METOPROLOL TARTRATE 25 MG/10 ML ORAL SUSPENSION
12.5000 mg | Freq: Two times a day (BID) | ORAL | Status: DC
  Filled 2011-06-14 (×3): qty 5

## 2011-06-14 MED ORDER — ACETAMINOPHEN 160 MG/5ML PO SOLN
975.0000 mg | Freq: Four times a day (QID) | ORAL | Status: DC
Start: 2011-06-14 — End: 2011-06-18
  Filled 2011-06-14: qty 40.6

## 2011-06-14 MED ORDER — LACTATED RINGERS IV SOLN: INTRAVENOUS | Status: DC

## 2011-06-14 MED ORDER — INSULIN REGULAR BOLUS VIA INFUSION
0.0000 [IU] | Freq: Three times a day (TID) | INTRAVENOUS | Status: DC
  Filled 2011-06-14: qty 10

## 2011-06-14 MED ORDER — VANCOMYCIN HCL IN DEXTROSE 1-5 GM/200ML-% IV SOLN
1000.0000 mg | Freq: Once | INTRAVENOUS | Status: AC
  Administered 2011-06-14: 1000 mg via INTRAVENOUS
  Filled 2011-06-14: qty 200

## 2011-06-14 MED ORDER — LACTATED RINGERS IV SOLN
INTRAVENOUS | Status: DC | PRN
  Administered 2011-06-14 (×2): via INTRAVENOUS

## 2011-06-14 MED ORDER — SODIUM CHLORIDE 0.9 % IJ SOLN
3.0000 mL | Freq: Two times a day (BID) | INTRAMUSCULAR | Status: DC
  Administered 2011-06-15 – 2011-06-18 (×5): 3 mL via INTRAVENOUS

## 2011-06-14 MED ORDER — 0.9 % SODIUM CHLORIDE (POUR BTL) OPTIME
TOPICAL | Status: DC | PRN
  Administered 2011-06-14: 6000 mL

## 2011-06-14 MED ORDER — VECURONIUM BROMIDE 10 MG IV SOLR
INTRAVENOUS | Status: DC | PRN
  Administered 2011-06-14 (×3): 5 mg via INTRAVENOUS
  Administered 2011-06-14: 6 mg via INTRAVENOUS
  Administered 2011-06-14: 4 mg via INTRAVENOUS

## 2011-06-14 MED ORDER — ASPIRIN 81 MG PO CHEW: 324.0000 mg | CHEWABLE_TABLET | Freq: Every day | ORAL | Status: DC

## 2011-06-14 SURGICAL SUPPLY — 119 items
ADAPTER CARDIO PERF ANTE/RETRO (ADAPTER) ×2 IMPLANT
ATRICURE ATRICLIP ×2 IMPLANT
ATTRACTOMAT 16X20 MAGNETIC DRP (DRAPES) ×2 IMPLANT
BAG DECANTER FOR FLEXI CONT (MISCELLANEOUS) ×2 IMPLANT
BANDAGE ELASTIC 4 VELCRO ST LF (GAUZE/BANDAGES/DRESSINGS) ×2 IMPLANT
BANDAGE ELASTIC 6 VELCRO ST LF (GAUZE/BANDAGES/DRESSINGS) ×2 IMPLANT
BANDAGE GAUZE ELAST BULKY 4 IN (GAUZE/BANDAGES/DRESSINGS) ×2 IMPLANT
BASKET HEART (ORDER IN 25'S) (MISCELLANEOUS) ×1
BASKET HEART (ORDER IN 25S) (MISCELLANEOUS) ×1 IMPLANT
BLADE STERNUM SYSTEM 6 (BLADE) ×2 IMPLANT
BLADE SURG 11 STRL SS (BLADE) ×2 IMPLANT
CANISTER SUCTION 2500CC (MISCELLANEOUS) ×2 IMPLANT
CANN PRFSN 3/8X14X24FR PCFC (MISCELLANEOUS) ×1
CANNULA ARTERIAL NVNT 3/8 22FR (MISCELLANEOUS) ×2 IMPLANT
CANNULA GUNDRY RCSP 15FR (MISCELLANEOUS) ×2 IMPLANT
CANNULA PRFSN 3/8X14X24FR PCFC (MISCELLANEOUS) ×1 IMPLANT
CANNULA VEN MTL TIP RT (MISCELLANEOUS) ×1
CANNULA VRC MALB SNGL STG 36FR (MISCELLANEOUS) ×1 IMPLANT
CARDIOBLATE CARDIAC ABLATION (MISCELLANEOUS)
CATH ROBINSON RED A/P 18FR (CATHETERS) ×6 IMPLANT
CATH THORACIC 28FR (CATHETERS) ×2 IMPLANT
CATH THORACIC 28FR RT ANG (CATHETERS) IMPLANT
CATH THORACIC 36FR (CATHETERS) ×2 IMPLANT
CATH THORACIC 36FR RT ANG (CATHETERS) ×2 IMPLANT
CLIP FOGARTY SPRING 6M (CLIP) ×4 IMPLANT
CLIP TI MEDIUM 24 (CLIP) IMPLANT
CLIP TI WIDE RED SMALL 24 (CLIP) ×2 IMPLANT
CLOTH BEACON ORANGE TIMEOUT ST (SAFETY) ×2 IMPLANT
CONN 1/2X1/2X1/2  BEN (MISCELLANEOUS) ×1
CONN 1/2X1/2X1/2 BEN (MISCELLANEOUS) ×1 IMPLANT
CONN 3/8X1/2 ST GISH (MISCELLANEOUS) ×2 IMPLANT
COVER SURGICAL LIGHT HANDLE (MISCELLANEOUS) ×4 IMPLANT
CRADLE DONUT ADULT HEAD (MISCELLANEOUS) ×2 IMPLANT
DEVICE CARDIOBLATE CARDIAC ABL (MISCELLANEOUS) IMPLANT
DRAPE CARDIOVASCULAR INCISE (DRAPES) ×1
DRAPE SLUSH MACHINE 52X66 (DRAPES) IMPLANT
DRAPE SLUSH/WARMER DISC (DRAPES) IMPLANT
DRAPE SRG 135X102X78XABS (DRAPES) ×1 IMPLANT
DRSG COVADERM 4X14 (GAUZE/BANDAGES/DRESSINGS) ×2 IMPLANT
ELECT CAUTERY BLADE 6.4 (BLADE) ×2 IMPLANT
ELECT REM PT RETURN 9FT ADLT (ELECTROSURGICAL) ×4
ELECTRODE REM PT RTRN 9FT ADLT (ELECTROSURGICAL) ×2 IMPLANT
GLOVE BIO SURGEON STRL SZ 6 (GLOVE) ×8 IMPLANT
GLOVE BIO SURGEON STRL SZ 6.5 (GLOVE) ×4 IMPLANT
GLOVE BIO SURGEON STRL SZ7 (GLOVE) IMPLANT
GLOVE BIO SURGEON STRL SZ7.5 (GLOVE) IMPLANT
GLOVE BIOGEL PI IND STRL 6 (GLOVE) IMPLANT
GLOVE BIOGEL PI IND STRL 6.5 (GLOVE) IMPLANT
GLOVE BIOGEL PI IND STRL 7.0 (GLOVE) IMPLANT
GLOVE BIOGEL PI INDICATOR 6 (GLOVE)
GLOVE BIOGEL PI INDICATOR 6.5 (GLOVE)
GLOVE BIOGEL PI INDICATOR 7.0 (GLOVE)
GLOVE EUDERMIC 7 POWDERFREE (GLOVE) ×4 IMPLANT
GLOVE ORTHO TXT STRL SZ7.5 (GLOVE) IMPLANT
GOWN PREVENTION PLUS XLARGE (GOWN DISPOSABLE) IMPLANT
GOWN STRL NON-REIN LRG LVL3 (GOWN DISPOSABLE) IMPLANT
HEMOSTAT POWDER SURGIFOAM 1G (HEMOSTASIS) ×8 IMPLANT
HEMOSTAT SURGICEL 2X14 (HEMOSTASIS) ×2 IMPLANT
INSERT FOGARTY 61MM (MISCELLANEOUS) IMPLANT
INSERT FOGARTY XLG (MISCELLANEOUS) IMPLANT
KIT BASIN OR (CUSTOM PROCEDURE TRAY) ×2 IMPLANT
KIT CATH CPB BARTLE (MISCELLANEOUS) ×2 IMPLANT
KIT ROOM TURNOVER OR (KITS) ×2 IMPLANT
KIT SUCTION CATH 14FR (SUCTIONS) ×2 IMPLANT
KIT VASOVIEW W/TROCAR VH 2000 (KITS) ×2 IMPLANT
LINE VENT (MISCELLANEOUS) ×2 IMPLANT
LOOP VESSEL SUPERMAXI WHITE (MISCELLANEOUS) ×2 IMPLANT
NS IRRIG 1000ML POUR BTL (IV SOLUTION) ×12 IMPLANT
PACK OPEN HEART (CUSTOM PROCEDURE TRAY) ×2 IMPLANT
PAD ARMBOARD 7.5X6 YLW CONV (MISCELLANEOUS) ×4 IMPLANT
PENCIL BUTTON HOLSTER BLD 10FT (ELECTRODE) ×2 IMPLANT
PROBE CRYO2-ABLATION MALLABLE (MISCELLANEOUS) ×2 IMPLANT
PUNCH AORTIC ROTATE 4.0MM (MISCELLANEOUS) IMPLANT
PUNCH AORTIC ROTATE 4.5MM 8IN (MISCELLANEOUS) ×2 IMPLANT
PUNCH AORTIC ROTATE 5MM 8IN (MISCELLANEOUS) IMPLANT
SET CARDIOPLEGIA MPS 5001102 (MISCELLANEOUS) ×2 IMPLANT
SPONGE GAUZE 4X4 12PLY (GAUZE/BANDAGES/DRESSINGS) ×4 IMPLANT
SPONGE INTESTINAL PEANUT (DISPOSABLE) IMPLANT
SPONGE LAP 18X18 X RAY DECT (DISPOSABLE) ×4 IMPLANT
SPONGE LAP 4X18 X RAY DECT (DISPOSABLE) ×2 IMPLANT
STRIP CLOSURE SKIN 1/4X4 (GAUZE/BANDAGES/DRESSINGS) ×2 IMPLANT
SUT BONE WAX W31G (SUTURE) ×2 IMPLANT
SUT ETHIBOND 2 0 SH (SUTURE) ×1
SUT ETHIBOND 2 0 SH 36X2 (SUTURE) ×1 IMPLANT
SUT MNCRL AB 4-0 PS2 18 (SUTURE) ×2 IMPLANT
SUT PROLENE 3 0 SH DA (SUTURE) ×6 IMPLANT
SUT PROLENE 3 0 SH1 36 (SUTURE) ×2 IMPLANT
SUT PROLENE 4 0 RB 1 (SUTURE) ×2
SUT PROLENE 4 0 SH DA (SUTURE) IMPLANT
SUT PROLENE 4-0 RB1 .5 CRCL 36 (SUTURE) ×2 IMPLANT
SUT PROLENE 5 0 C 1 36 (SUTURE) IMPLANT
SUT PROLENE 6 0 C 1 30 (SUTURE) ×4 IMPLANT
SUT PROLENE 7 0 BV 1 (SUTURE) IMPLANT
SUT PROLENE 7 0 BV1 MDA (SUTURE) ×4 IMPLANT
SUT PROLENE 8 0 BV175 6 (SUTURE) IMPLANT
SUT SILK  1 MH (SUTURE)
SUT SILK 1 MH (SUTURE) IMPLANT
SUT STEEL STERNAL CCS#1 18IN (SUTURE) IMPLANT
SUT STEEL SZ 6 DBL 3X14 BALL (SUTURE) ×6 IMPLANT
SUT VIC AB 1 CTX 36 (SUTURE) ×2
SUT VIC AB 1 CTX36XBRD ANBCTR (SUTURE) ×2 IMPLANT
SUT VIC AB 2-0 CT1 27 (SUTURE) ×2
SUT VIC AB 2-0 CT1 TAPERPNT 27 (SUTURE) ×1 IMPLANT
SUT VIC AB 2-0 CTX 27 (SUTURE) IMPLANT
SUT VIC AB 3-0 SH 27 (SUTURE)
SUT VIC AB 3-0 SH 27X BRD (SUTURE) IMPLANT
SUT VIC AB 3-0 X1 27 (SUTURE) IMPLANT
SUT VICRYL 4-0 PS2 18IN ABS (SUTURE) IMPLANT
SUTURE E-PAK OPEN HEART (SUTURE) ×2 IMPLANT
SYS ATRICLIP LAA EXCLUSION 45 (CLIP) IMPLANT
SYSTEM SAHARA CHEST DRAIN ATS (WOUND CARE) ×2 IMPLANT
TOWEL OR 17X24 6PK STRL BLUE (TOWEL DISPOSABLE) ×2 IMPLANT
TOWEL OR 17X26 10 PK STRL BLUE (TOWEL DISPOSABLE) ×2 IMPLANT
TRAY FOLEY IC TEMP SENS 14FR (CATHETERS) ×2 IMPLANT
TUBE SUCT INTRACARD DLP 20F (MISCELLANEOUS) ×2 IMPLANT
TUBING INSUFFLATION 10FT LAP (TUBING) ×2 IMPLANT
UNDERPAD 30X30 INCONTINENT (UNDERPADS AND DIAPERS) ×2 IMPLANT
VRC MALLEABLE SINGLE STG 36FR (MISCELLANEOUS) ×2
WATER STERILE IRR 1000ML POUR (IV SOLUTION) ×4 IMPLANT

## 2011-06-14 NOTE — OR Nursing (Signed)
14:55pm: called vol. Desk to inform family off pump, 1st call to SICU.

## 2011-06-14 NOTE — Progress Notes (Signed)
S/p CABG,Maze EF.45 A-V paced 88/min  Junctional  64/min  stable hemodynamics on low dose dopa,neo Labs ok

## 2011-06-14 NOTE — Preoperative (Signed)
Beta Blockers   Reason not to administer Beta Blockers:Not Applicable 

## 2011-06-14 NOTE — Progress Notes (Signed)
Dr Prescott Gum aware of pre-extubation gas results. (pO2 58, sO2 88) Order received to place back on full support for 2 hours and re-try weaning process after that period. RT placed patient on PRVC 50, rate 12, peep 5, tidal volume 700. Will re-try wean process in 2 hours. Richarda Blade RN

## 2011-06-14 NOTE — OR Nursing (Signed)
Made second call to 2300 SICU at 1527.

## 2011-06-14 NOTE — Brief Op Note (Addendum)
06/14/2011  11:59 AM  PATIENT:  Luberta Mutter Stawicki  71 y.o. male  PRE-OPERATIVE DIAGNOSIS:  CAD, atrial fib  POST-OPERATIVE DIAGNOSIS:  CAD, atrial fib  PROCEDURE:  Procedure(s):  CORONARY ARTERY BYPASS GRAFTING (CABG) x 6 (LIMA-LAD, SVG-D1, SVG-D2, SVG-Cx, SVG-PD-PL), EVH Right leg  MAZE PROCEDURE       Ligation of LAA with clip  SURGEON:  Surgeon(s): Gaye Pollack, MD  ASSISTANT: Suzzanne Cloud, PA-C, Erin Barrett, PA-C  ANESTHESIA:   general  PATIENT CONDITION:  ICU - intubated and hemodynamically stable.  PRE-OPERATIVE WEIGHT: 108 kg

## 2011-06-14 NOTE — Transfer of Care (Signed)
Immediate Anesthesia Transfer of Care Note  Patient: Edward [name]  Procedure(s) Performed: Procedure(s) (LRB): CORONARY ARTERY BYPASS GRAFTING (CABG) (N/A) MAZE (N/A)  Patient Location: SICU  Anesthesia Type: General  Level of Consciousness: Patient remains intubated per anesthesia plan  Airway & Oxygen Therapy: Patient remains intubated per anesthesia plan  Post-op Assessment: Report given to PACU RN and Post -op Vital signs reviewed and stable  Post vital signs: Reviewed and stable  Complications: No apparent anesthesia complications

## 2011-06-14 NOTE — Anesthesia Preprocedure Evaluation (Addendum)
Anesthesia Evaluation  Patient identified by MRN, date of birth, ID band Patient awake    Reviewed: Allergy & Precautions, H&P , NPO status , Patient's Chart, lab work & pertinent test results, reviewed documented beta blocker date and time   Airway Mallampati: II  Neck ROM: Full    Dental  (+) Teeth Intact and Dental Advisory Given   Pulmonary  breath sounds clear to auscultation        Cardiovascular hypertension, Pt. on home beta blockers + dysrhythmias Atrial Fibrillation Rhythm:Regular Rate:Normal     Neuro/Psych    GI/Hepatic Neg liver ROS, GERD-  ,  Endo/Other  Diabetes mellitus-, Oral Hypoglycemic Agents  Renal/GU negative Renal ROS     Musculoskeletal negative musculoskeletal ROS (+)   Abdominal   Peds  Hematology   Anesthesia Other Findings   Reproductive/Obstetrics                         Anesthesia Physical Anesthesia Plan  ASA: IV  Anesthesia Plan: General   Post-op Pain Management:    Induction: Intravenous  Airway Management Planned: Oral ETT  Additional Equipment: Arterial line and PA Cath  Intra-op Plan:   Post-operative Plan: Post-operative intubation/ventilation  Informed Consent: I have reviewed the patients History and Physical, chart, labs and discussed the procedure including the risks, benefits and alternatives for the proposed anesthesia with the patient or authorized representative who has indicated his/her understanding and acceptance.     Plan Discussed with: CRNA  Anesthesia Plan Comments:         Anesthesia Quick Evaluation

## 2011-06-14 NOTE — Anesthesia Procedure Notes (Signed)
Procedure Name: Intubation Date/Time: 06/14/2011 7:58 AM Performed by: Julian Reil Pre-anesthesia Checklist: Patient identified, Emergency Drugs available, Suction available and Patient being monitored Patient Re-evaluated:Patient Re-evaluated prior to inductionOxygen Delivery Method: Circle system utilized Preoxygenation: Pre-oxygenation with 100% oxygen Intubation Type: IV induction Ventilation: Mask ventilation without difficulty and Oral airway inserted - appropriate to patient size Laryngoscope Size: Mac and 4 Grade View: Grade II Tube type: Oral Tube size: 8.0 mm Number of attempts: 2 Airway Equipment and Method: Bougie stylet Placement Confirmation: ETT inserted through vocal cords under direct vision,  positive ETCO2 and breath sounds checked- equal and bilateral Secured at: 22 cm Tube secured with: Tape Dental Injury: Teeth and Oropharynx as per pre-operative assessment

## 2011-06-14 NOTE — Anesthesia Postprocedure Evaluation (Signed)
  Anesthesia Post-op Note  Patient: Edward [name]  Procedure(s) Performed: Procedure(s) (LRB): CORONARY ARTERY BYPASS GRAFTING (CABG) (N/A) MAZE (N/A)  Patient Location: PACU and SICU  Anesthesia Type: General  Level of Consciousness: awake and Patient remains intubated per anesthesia plan  Airway and Oxygen Therapy: Patient remains intubated per anesthesia plan  Post-op Pain: mild  Post-op Assessment: Post-op Vital signs reviewed  Post-op Vital Signs: Reviewed  Complications: No apparent anesthesia complications

## 2011-06-14 NOTE — Interval H&P Note (Signed)
History and Physical Interval Note:  06/14/2011 7:33 AM  Edward [name]  has presented today for surgery, with the diagnosis of CAD, atrial fib  The various methods of treatment have been discussed with the patient and family. After consideration of risks, benefits and other options for treatment, the patient has consented to  Procedure(s) (LRB): CORONARY ARTERY BYPASS GRAFTING (CABG) (N/A) MAZE (N/A) as a surgical intervention .  The patients' history has been reviewed, patient examined, no change in status, stable for surgery.  I have reviewed the patients' chart and labs.  Questions were answered to the patient's satisfaction.     Gaye Pollack

## 2011-06-15 ENCOUNTER — Inpatient Hospital Stay (HOSPITAL_COMMUNITY): Payer: Medicare Other

## 2011-06-15 ENCOUNTER — Encounter (HOSPITAL_COMMUNITY): Payer: Self-pay | Admitting: Certified Registered"

## 2011-06-15 LAB — POCT I-STAT, CHEM 8
Creatinine, Ser: 1.8 mg/dL — ABNORMAL HIGH (ref 0.50–1.35)
HCT: 38 % — ABNORMAL LOW (ref 39.0–52.0)
Hemoglobin: 12.9 g/dL — ABNORMAL LOW (ref 13.0–17.0)
Potassium: 4.9 mEq/L (ref 3.5–5.1)
Sodium: 134 mEq/L — ABNORMAL LOW (ref 135–145)
TCO2: 22 mmol/L (ref 0–100)

## 2011-06-15 LAB — GLUCOSE, CAPILLARY
Glucose-Capillary: 100 mg/dL — ABNORMAL HIGH (ref 70–99)
Glucose-Capillary: 104 mg/dL — ABNORMAL HIGH (ref 70–99)
Glucose-Capillary: 105 mg/dL — ABNORMAL HIGH (ref 70–99)
Glucose-Capillary: 106 mg/dL — ABNORMAL HIGH (ref 70–99)
Glucose-Capillary: 113 mg/dL — ABNORMAL HIGH (ref 70–99)
Glucose-Capillary: 116 mg/dL — ABNORMAL HIGH (ref 70–99)
Glucose-Capillary: 116 mg/dL — ABNORMAL HIGH (ref 70–99)
Glucose-Capillary: 119 mg/dL — ABNORMAL HIGH (ref 70–99)
Glucose-Capillary: 121 mg/dL — ABNORMAL HIGH (ref 70–99)
Glucose-Capillary: 132 mg/dL — ABNORMAL HIGH (ref 70–99)
Glucose-Capillary: 146 mg/dL — ABNORMAL HIGH (ref 70–99)
Glucose-Capillary: 175 mg/dL — ABNORMAL HIGH (ref 70–99)
Glucose-Capillary: 97 mg/dL (ref 70–99)

## 2011-06-15 LAB — BASIC METABOLIC PANEL
BUN: 19 mg/dL (ref 6–23)
Calcium: 8.2 mg/dL — ABNORMAL LOW (ref 8.4–10.5)
GFR calc Af Amer: 51 mL/min — ABNORMAL LOW (ref >90)
GFR calc non Af Amer: 44 mL/min — ABNORMAL LOW (ref >90)
Potassium: 4.7 mEq/L (ref 3.5–5.1)
Sodium: 138 mEq/L (ref 135–145)

## 2011-06-15 LAB — POCT I-STAT 3, ART BLOOD GAS (G3+)
Acid-base deficit: 3 mmol/L — ABNORMAL HIGH (ref 0.0–2.0)
O2 Saturation: 92 %
Patient temperature: 37.8
pCO2 arterial: 43.9 mmHg (ref 35.0–45.0)
pH, Arterial: 7.329 — ABNORMAL LOW (ref 7.350–7.450)
pH, Arterial: 7.353 (ref 7.350–7.450)

## 2011-06-15 LAB — CBC
HCT: 39.2 % (ref 39.0–52.0)
Hemoglobin: 11.6 g/dL — ABNORMAL LOW (ref 13.0–17.0)
MCH: 22.4 pg — ABNORMAL LOW (ref 26.0–34.0)
MCHC: 31.4 g/dL (ref 30.0–36.0)
Platelets: 180 10*3/uL (ref 150–400)
RBC: 5.12 MIL/uL (ref 4.22–5.81)
RDW: 18.1 % — ABNORMAL HIGH (ref 11.5–15.5)
WBC: 22.9 10*3/uL — ABNORMAL HIGH (ref 4.0–10.5)

## 2011-06-15 LAB — CREATININE, SERUM
Creatinine, Ser: 1.53 mg/dL — ABNORMAL HIGH (ref 0.50–1.35)
GFR calc Af Amer: 52 mL/min — ABNORMAL LOW (ref >90)
GFR calc non Af Amer: 45 mL/min — ABNORMAL LOW (ref >90)

## 2011-06-15 MED ORDER — ASPIRIN EC 81 MG PO TBEC
81.0000 mg | DELAYED_RELEASE_TABLET | Freq: Every day | ORAL | Status: DC
  Administered 2011-06-16 – 2011-06-18 (×2): 81 mg via ORAL
  Filled 2011-06-15 (×4): qty 1

## 2011-06-15 MED ORDER — INSULIN ASPART 100 UNIT/ML ~~LOC~~ SOLN
0.0000 [IU] | SUBCUTANEOUS | Status: DC
  Administered 2011-06-15: 2 [IU] via SUBCUTANEOUS

## 2011-06-15 MED ORDER — ASPIRIN 81 MG PO CHEW
81.0000 mg | CHEWABLE_TABLET | Freq: Every day | ORAL | Status: DC
  Administered 2011-06-15 – 2011-06-17 (×2): 81 mg
  Filled 2011-06-15 (×2): qty 1

## 2011-06-15 MED ORDER — INSULIN ASPART 100 UNIT/ML ~~LOC~~ SOLN
0.0000 [IU] | SUBCUTANEOUS | Status: DC
  Administered 2011-06-16 (×4): 2 [IU] via SUBCUTANEOUS
  Administered 2011-06-16 (×2): 4 [IU] via SUBCUTANEOUS
  Administered 2011-06-17 – 2011-06-18 (×5): 2 [IU] via SUBCUTANEOUS
  Administered 2011-06-18: 4 [IU] via SUBCUTANEOUS

## 2011-06-15 MED ORDER — INSULIN GLARGINE 100 UNIT/ML ~~LOC~~ SOLN
20.0000 [IU] | Freq: Every day | SUBCUTANEOUS | Status: DC
  Administered 2011-06-15 – 2011-06-18 (×4): 20 [IU] via SUBCUTANEOUS

## 2011-06-15 MED ORDER — INSULIN ASPART 100 UNIT/ML ~~LOC~~ SOLN
0.0000 [IU] | SUBCUTANEOUS | Status: AC
  Administered 2011-06-15: 2 [IU] via SUBCUTANEOUS
  Administered 2011-06-15 (×2): 4 [IU] via SUBCUTANEOUS

## 2011-06-15 MED FILL — Magnesium Sulfate Inj 50%: INTRAMUSCULAR | Qty: 10 | Status: AC

## 2011-06-15 MED FILL — Heparin Sodium (Porcine) Inj 1000 Unit/ML: INTRAMUSCULAR | Qty: 30 | Status: AC

## 2011-06-15 MED FILL — Potassium Chloride Inj 2 mEq/ML: INTRAVENOUS | Qty: 40 | Status: AC

## 2011-06-15 NOTE — Progress Notes (Signed)
Patient ID: Edward [name], male   DOB: 03-Jun-1940, 71 y.o.   MRN: [id number] Filed Vitals:   06/15/11 1700 06/15/11 1715 06/15/11 1730 06/15/11 1745  BP: 104/67     Pulse: 88 88 88 88  Temp:      TempSrc:      Resp: 16 15 17 16   Height:      Weight:      SpO2: 96% 96% 96% 96%  AV paced at 90.  Looks junctional undernealth. On dop 4.  Urine output ok.  Creat up to 1.8 this pm.  Ambulated 500 ft.  A/P:  Stable. Continue present course

## 2011-06-15 NOTE — Progress Notes (Signed)
1 Day Post-Op Procedure(s) (LRB): CORONARY ARTERY BYPASS GRAFTING (CABG) (N/A) MAZE (N/A) Subjective: sore  Objective: Vital signs in last 24 hours: Temp:  [97.3 F (36.3 C)-100.2 F (37.9 C)] 99.1 F (37.3 C) (05/31 0745) Pulse Rate:  [86-89] 88  (05/31 0745) Cardiac Rhythm:  [-] A-V Sequential paced (05/31 0745) Resp:  [11-22] 15  (05/31 0745) BP: (87-106)/(57-70) 98/70 mmHg (05/31 0700) SpO2:  [90 %-98 %] 95 % (05/31 0745) Arterial Line BP: (80-115)/(50-69) 114/61 mmHg (05/31 0745) FiO2 (%):  [40 %-100 %] 40 % (05/30 2332) Weight:  [109.1 kg (240 lb 8.4 oz)] 109.1 kg (240 lb 8.4 oz) (05/31 0445)  Hemodynamic parameters for last 24 hours: PAP: (36-52)/(17-32) 45/21 mmHg CO:  [3.9 L/min-4.9 L/min] 4 L/min CI:  [1.7 L/min/m2-2.7 L/min/m2] 2.7 L/min/m2  Intake/Output from previous day: 05/30 0701 - 05/31 0700 In: 6227.6 [I.V.:4107.6; Blood:950; NG/GT:20; IV [id number]:4621] Out: 9471 [GXIVH:2929; Blood:1800; Chest Tube:298] Intake/Output this shift:    General appearance: alert and cooperative Neurologic: intact Heart: regular rate and rhythm, S1, S2 normal, no murmur, click, rub or gallop Lungs: clear to auscultation bilaterally Extremities: edema mild Wound: dressing dry  Lab Results:  Basename 06/15/11 0406 06/14/11 2238 06/14/11 2232  WBC 26.2* -- 21.7*  HGB 12.3* 13.9 --  HCT 39.2 41.0 --  PLT 202 -- 203   BMET:  Basename 06/15/11 0406 06/14/11 2238 06/12/11 0922  NA 138 139 --  K 4.7 4.5 --  CL 106 107 --  CO2 24 -- 22  GLUCOSE 105* 143* --  BUN 19 17 --  CREATININE 1.55* 1.40* --  CALCIUM 8.2* -- 9.0    PT/INR:  Basename 06/14/11 1630  LABPROT 16.5*  INR 1.31   ABG    Component Value Date/Time   PHART 7.329* 06/15/2011 0114   HCO3 22.8 06/15/2011 0114   TCO2 24 06/15/2011 0114   ACIDBASEDEF 3.0* 06/15/2011 0114   O2SAT 92.0 06/15/2011 0114   CBG (last 3)   Basename 06/15/11 0206 06/15/11 0103 06/14/11 2350  GLUCAP 116* 105* 116*     Assessment/Plan: S/P Procedure(s) (LRB): CORONARY ARTERY BYPASS GRAFTING (CABG) (N/A) MAZE (N/A) Wean neo as tolerated. Wean dop to 3. H/O recurrent A-fib.  Appears to be in slow junctional rhythm postop after MAZE. Will hold off on restarting Tambocor since he was on this preop and still in A-fib. Mobilize Diabetes control d/c tubes/lines See progression orders Creatinine up slightly from preop.  Will watch closely   LOS: 1 day    Xitlaly Ault K 06/15/2011

## 2011-06-15 NOTE — Procedures (Signed)
Extubation Procedure Note  Patient Details:   Name: Edward [name] DOB: [birth year] MRN: [id number]   Airway Documentation:  Airway 8 mm (Active)  Secured at (cm) 23 cm 06/14/2011 11:32 PM  Measured From Lips 06/14/2011 11:32 PM  Secured Location Left 06/14/2011 11:32 PM  Secured By Pink Tape 06/14/2011 11:32 PM  Site Condition Dry 06/14/2011 11:32 PM    Evaluation  O2 sats: 30% Complications: No apparent complications Patient did tolerate procedure well. Bilateral Breath Sounds: Clear;Diminished   Yes ABG    Component Value Date/Time   PHART 7.353 06/15/2011 0005   PCO2ART 39.3 06/15/2011 0005   PO2ART 75.0* 06/15/2011 0005   HCO3 21.6 06/15/2011 0005   TCO2 23 06/15/2011 0005   ACIDBASEDEF 3.0* 06/15/2011 0005   O2SAT 93.0 06/15/2011 0005     Pt extubated to 4 lpm nasal cannula. Nif -35 and VC 1400. Pt o2 sats 95%. No stridor and no apparent complications noted. RT will continue to monitor. HUDSON, Gearlene Godsil N 06/15/2011, 12:14 AM

## 2011-06-16 ENCOUNTER — Encounter (HOSPITAL_COMMUNITY): Payer: Self-pay | Admitting: *Deleted

## 2011-06-16 ENCOUNTER — Inpatient Hospital Stay (HOSPITAL_COMMUNITY): Payer: Medicare Other

## 2011-06-16 DIAGNOSIS — Z950 Presence of cardiac pacemaker: Secondary | ICD-10-CM | POA: Insufficient documentation

## 2011-06-16 LAB — GLUCOSE, CAPILLARY
Glucose-Capillary: 139 mg/dL — ABNORMAL HIGH (ref 70–99)
Glucose-Capillary: 140 mg/dL — ABNORMAL HIGH (ref 70–99)
Glucose-Capillary: 151 mg/dL — ABNORMAL HIGH (ref 70–99)
Glucose-Capillary: 196 mg/dL — ABNORMAL HIGH (ref 70–99)

## 2011-06-16 LAB — CBC
Hemoglobin: 10.4 g/dL — ABNORMAL LOW (ref 13.0–17.0)
MCH: 22 pg — ABNORMAL LOW (ref 26.0–34.0)
MCHC: 30.4 g/dL (ref 30.0–36.0)
Platelets: 170 10*3/uL (ref 150–400)
RBC: 4.72 MIL/uL (ref 4.22–5.81)

## 2011-06-16 LAB — BASIC METABOLIC PANEL
BUN: 28 mg/dL — ABNORMAL HIGH (ref 6–23)
Calcium: 8.2 mg/dL — ABNORMAL LOW (ref 8.4–10.5)
GFR calc Af Amer: 59 mL/min — ABNORMAL LOW (ref >90)
GFR calc non Af Amer: 51 mL/min — ABNORMAL LOW (ref >90)
Glucose, Bld: 140 mg/dL — ABNORMAL HIGH (ref 70–99)
Potassium: 4.8 mEq/L (ref 3.5–5.1)
Sodium: 132 mEq/L — ABNORMAL LOW (ref 135–145)

## 2011-06-16 MED ORDER — PNEUMOCOCCAL VAC POLYVALENT 25 MCG/0.5ML IJ INJ: 0.5000 mL | INJECTION | INTRAMUSCULAR | Status: DC | PRN

## 2011-06-16 MED ORDER — FUROSEMIDE 10 MG/ML IJ SOLN
40.0000 mg | Freq: Two times a day (BID) | INTRAMUSCULAR | Status: AC
  Administered 2011-06-16 (×2): 40 mg via INTRAVENOUS
  Filled 2011-06-16 (×2): qty 4

## 2011-06-16 NOTE — Op Note (Signed)
NAMECAELEB, BATALLA               ACCOUNT NO.:  [id number]  MEDICAL RECORD NO.:  [id number]  LOCATION:  2309                         FACILITY:  [facility]  PHYSICIAN:  Gilford Raid, M.D.     DATE OF BIRTH:  [birth year]  DATE OF PROCEDURE:  06/14/2011 DATE OF DISCHARGE:                              OPERATIVE REPORT   PREOPERATIVE DIAGNOSES: 1. Severe multivessel coronary artery disease. 2. Recurrent atrial fibrillation.  POSTOPERATIVE DIAGNOSIS: 1. Severe multivessel coronary artery disease. 2. Recurrent atrial fibrillation  OPERATIVE PROCEDURE: 1. Median sternotomy. 2. Extracorporeal circulation. 3. Coronary artery bypass graft surgery x6 using a left internal     mammary artery graft to the left anterior descending coronary     artery, a saphenous vein graft to the first diagonal branch, a     saphenous vein graft to the second diagonal branch, a saphenous     vein graft to the obtuse marginal coronary artery, and a sequential     saphenous vein graft to the posterior descending, and third     posterolateral branch of the right coronary artery. 4. Endoscopic vein harvesting from the right leg. 5. Right and left-sided maze procedure using cryothermy and bipolar     radiofrequency ablation.  ATTENDING SURGEON:  Gilford Raid, MD  ASSISTANTS:  Suzzanne Cloud, PA-C and Wyvonnia Lora, PA-C  ANESTHESIA:  General endotracheal.  CLINICAL HISTORY:  This patient is a 71 year old gentleman with a several year history of recurrent atrial fibrillation that has been treated with antiarrhythmic therapy and multiple cardioversions.  He has not been maintained on anticoagulation due to GI bleeding.  He did have upper and lower endoscopy which did not show any obvious bleeding site although he did have diverticulosis of the colon.  He now presents with a several month history of exertional dyspnea and generalized fatigue that is getting worse and was found to be back in atrial fibrillation. He  underwent cardiac catheterization showing a significant multivessel coronary artery disease with a 90% proximal and 70% mid-LAD stenoses. These were compromising 2 diagonal branches.  There was also a moderate disease in the left circumflex, and the stenosis was estimated about 50% in the mid vessel.  The right coronary artery was a large dominant vessel.  It gave off a posterior descending and 3 posterolateral branches.  There was about 50% stenosis at the takeoff of the posterior descending, and about 50% stenosis at the takeoff of the third posterolateral branch.  Left ventricular ejection fraction about 40-50% with anterior akinesis.  After review of the cardiac catheterization and examination of the patient, it was felt that coronary artery bypass graft surgery is indicated to prevent further ischemia and infarction. I felt that a right and left-sided maze procedure should be performed to try to keep him out of atrial fibrillation given his recurrent episodes and the fact that he could not be on anticoagulation without GI bleeding.  I also felt that clipping of left atrial appendage was indicated.  I discussed the operative procedure in detail with the patient and his wife.  We discussed the alternatives, benefits, and risks including, but not limited to bleeding, blood transfusion, infection, stroke, myocardial infarction, graft failure,  heart block requiring permanent pacemaker, failure of the maze procedure to maintain sinus rhythm or another acceptable rhythm or recurrent atrial fibrillation, organ dysfunction, and death.  He understood all this and agreed to proceed.  OPERATIVE PROCEDURE:  The patient  was taken to the operating room, placed on the table in supine position.  After induction of general endotracheal anesthesia, a Foley catheter was placed in the bladder using sterile technique.  Then, the chest, abdomen, and both lower extremities were prepped and draped in usual  sterile manner.  The chest was entered through a median sternotomy incision, the pericardium opened in midline.  Examination of the heart showed good right ventricular contractility.  The ascending aorta was of normal size and had no palpable plaques in it.  Then, the left internal mammary artery was harvested from chest wall as a pedicle graft.  This was a medium caliber vessel with excellent blood flow through it.  At the same time, a segment of greater saphenous vein was harvested from the right leg using endoscopic vein harvest technique.  This vein was of excellent quality and medium caliber. Then, the patient was heparinized and when an adequate ACT was obtained, the distal ascending aorta was cannulated using a 22-French aortic cannula for arterial inflow.  Venous outflow was achieved using bicaval venous cannulation with a 24-French metal-tipped right angle cannula placed through a pursestring suture in the superior vena cava and a 36- [place] plastic right-angled cannula placed through a pursestring suture in the low right atrium.  An antegrade cardioplegia and vent cannula was inserted in the aortic root.  Then, the patient was placed on cardiopulmonary bypass and distal coronaries identified.  The LAD was a moderate-sized vessel beyond the stenoses.  Both diagonal branches were moderate size graftable vessels with the second diagonal being larger.  The obtuse marginal was a moderate-sized vessel with no distal disease in it.  The posterior descending and third posterolateral branches were both moderate sized vessels that were suitable for grafting with minimal disease beyond the ostium.  The anterior wall did appear severely hypokinetic.  Then, the aorta was crossclamped and 1000 mL of cold blood antegrade cardioplegia was administered in the aortic root with quick arrest of the heart.  Systemic hypothermia to 32 degrees centigrade and topical hypothermic iced saline was  used.  Temperature probe was placed in the septum and insulating pad in the pericardium.  Additional doses of cold blood antegrade cardioplegia were given about 20 minutes intervals to maintain myocardial temperature around 10 degrees centigrade or less.  Then, the first distal anastomosis was performed to the obtuse marginal branch.  The internal diameter was 1.75 mm.  The conduit used was a segment of greater saphenous vein and the anastomosis performed in an end-to-side manner using continuous 7-0 Prolene suture.  Flow was noted through the graft and was excellent.  Second distal anastomosis was performed to the posterior descending coronary artery.  The internal diameter was 1.75 mm.  Conduit used was a second segment of greater saphenous vein and the anastomosis performed in a sequential side-to-side manner using continuous 7-0 Prolene suture. Flow was noted through the graft and was excellent.  Third distal anastomosis was performed to the third posterolateral branch.  The internal diameter was 1.6 mm.  The conduit used was the same segment of greater saphenous vein and the anastomosis performed in a sequential end-to-side manner using continuous 7-0 Prolene suture. Flow was noted through the graft and was excellent.  The fourth  distal anastomosis was performed to the second diagonal branch.  The internal diameter was 1.75 mm.  The conduit used was a third segment of greater saphenous vein and the anastomosis performed in an end-to-side manner using continuous 7-0 Prolene suture. Flow was noted through the graft and was excellent.  Fifth distal anastomosis was performed to the first diagonal branch. The internal diameter was 1.6 mm.  The conduit used was a fourth segment of greater saphenous vein and the anastomosis performed in an end-to- side manner using continuous 7-0 Prolene suture.  Flow was noted through the graft and was excellent.  The sixth distal anastomosis was  performed to the mid LAD.  The internal diameter of this vessel was 1.75 mm.  Conduit used was the left internal mammary graft was brought through an opening in the left pericardium anterior to the phrenic nerve.  This was anastomosed to the LAD in an end-to-side manner using continuous 8-0 Prolene suture.  The pedicle was sutured to the epicardium with 6-0 Prolene sutures.  Then, attention was turned to the maze procedure.  The heart was retracted towards the right to expose the left pulmonary veins.  These were encircled with a tape without difficulty.  We used the AtriCure system with a bipolar radiofrequency clamp and cryothermy probe.  Then, the bipolar clamp was placed around the left pulmonary veins and an encircling lesion was created on the left atrial wall around the pulmonary veins.  Then, a second lesion was performed around the base of the left atrial appendage.  The cryothermy probe was then used to create a lesion between the left atrial appendage and the left pulmonary veins. The atrial appendage was measured at a space and a 35-mm atrial clip was chosen.  This was applied to the base of left atrial appendage without difficulty.  Then, the heart was returned to its normal position.  The interatrial groove was exposed, and the left atrium opened through a vertical incision in the interatrial groove.  Hand-held mitral retractors were used to expose the left atrium.  Dissection was performed around the right pulmonary veins posteriorly, and then the bipolar clamp was used to continue an encircling lesion around the right pulmonary veins using the atriotomy incision as the anterior portion of this lesion.  Then, the cryothermy probe was used to create a lesion between the right and left pulmonary veins superiorly and inferiorly creating a box in the posterior atrial wall.  A lesion was then created from the  inferior lesion down to the mitral anulus in the area of P3. Then,  the left atriotomy incision was closed using a double layer of continuous 3-0 Prolene suture pledgeted on the ends.  The left atrium was filled with blood prior to tying the sutures.  We did insufflate the pericardium with CO2 throughout the procedure to minimize intracardiac air.  We pulled the pulmonary artery catheter back slightly to make sure that it was still moving after the anastomosis of the atrial wall. Then, the heart was retracted cephalad and a cryothermy lesion was placed across the coronary sinus from the exterior between the distal left circumflex and the distal most posterolateral branch of the right coronary artery.  All cryothermy lesions were placed for 2 minutes. Then, the heart was returned to its normal position and attention turned to the right atrium.  The superior vena cava and the inferior vena cava were encircled with tapes and these were snug tightly to prevent air entry.  Then, the  lesion was placed around the base of the right atrial appendage.  The right atrial appendage was opened through a short incision, and then the bipolar clamp was used to create an oblique lesion across the lateral surface of the right atrium.  Then, the right atrium was opened through another oblique incision beginning at the interatrial septum posteriorly and extending obliquely to the atrioventricular groove.  The bipolar clamp was used to create a lesion along the junction of the atrial wall and septum superiorly up to the superior vena cava and then inferiorly down to the inferior vena cava. Care was taken to avoid the sinus node area.  Then, using the cryothermy probe, the lesion was created from the posterior extent of this oblique incision across the interatrial septum down to the posterior aspect of the coronary sinus.  Another lesion was created from here up to the tricuspid anulus.  A lesion was also created from here down to the inferior vena cava.  Another lesion was  created from the most anterior extent of this oblique incision at the atrioventricular groove down to the tricuspid anulus.  A final incision was placed from the base to the right atrial appendage down to the tricuspid anulus.  Then, the right atriotomy incisions were closed using continuous 4-0 Prolene suture in 2 layers.  The patient was then rewarmed to 37 degrees centigrade.  The 4 proximal vein graft anastomoses were performed in the mid ascending aorta in an end-to-side manner using continuous 6-0 Prolene suture. After completion of these anastomoses, the head was placed in Trendelenburg position and the left side of the heart de-aired.  The clamp was removed from the mammary pedicle.  There was rapid warming of the ventricular septum.  The crossclamp was removed at a time of 244 minutes.  There was no return of cardiac rhythm at this time.  Over the next few minutes, a junctional rhythm returned.  The proximal and distal anastomoses were examined and were hemostatic.  __________ the graft was satisfactory.  Graft markers were placed around the proximal anastomoses.  Two temporary right ventricular and right atrial pacing wires were placed about and through the skin.  When the patient had rewarmed to 37 degrees centigrade, he was weaned from cardiopulmonary bypass on low-dose dopamine.  Total bypass time was 281 minutes.  Cardiac function appeared good with cardiac output of 5 L/minute.  He was paced in DOO mode.  Protamine was then given and the venous and aortic cannulae were removed without difficulty.  Hemostasis was achieved.  Three chest tubes were placed with 2 in the posterior pericardium, 1 in the left pleural space, and 1 in the anterior mediastinum.  The sternum was then closed with double #6 stainless steel wires.  The fascia closed with continuous #1 Vicryl suture. Subcutaneous tissue was closed with continuous 2-0 Vicryl and the skin with a 3-0 Vicryl subcuticular  closure.  The lower extremity vein harvest site was closed in layers in similar manner.  The sponge, needle, and instrument counts were correct according to scrub nurse. Dry sterile dressings were applied over the incisions around the chest tubes, which were hooked to Pleur-Evac suction.  The patient remained hemodynamically stable and was transported to the SICU in guarded but stable condition.     Gilford Raid, M.D.     BB/MEDQ  D:  06/16/2011  T:  06/16/2011  Job:  [id number]

## 2011-06-16 NOTE — Progress Notes (Signed)
Patient ID: Edward [name], male   DOB: [birth year], 71 y.o.   MRN: [id number]  Filed Vitals:   06/16/11 1400 06/16/11 1500 06/16/11 1600 06/16/11 1700  BP: 138/76 136/75 117/70 107/61  Pulse: 88 88 88 88  Temp:   97.6 F (36.4 C)   TempSrc:   Oral   Resp: 12 14 12 12   Height:      Weight:      SpO2: 98% 99% 98% 93%   DDD paced  Dopamine at 2  Not much diuresis with lasix 41m IV.  Walked 3 times so far.  A/P: stable.  Continue present course.

## 2011-06-16 NOTE — Progress Notes (Signed)
2 Days Post-Op Procedure(s) (LRB): CORONARY ARTERY BYPASS GRAFTING (CABG) (N/A) MAZE (N/A) Subjective: sore  Objective: Vital signs in last 24 hours: Temp:  [97.4 F (36.3 C)-99.1 F (37.3 C)] 98.2 F (36.8 C) (06/01 0733) Pulse Rate:  [72-89] 89  (06/01 0700) Cardiac Rhythm:  [-] A-V Sequential paced (06/01 0400) Resp:  [11-25] 15  (06/01 0700) BP: (101-138)/(59-76) 121/65 mmHg (06/01 0700) SpO2:  [80 %-99 %] 94 % (06/01 0700) Arterial Line BP: (78-149)/(47-81) 149/76 mmHg (05/31 1830) Weight:  [110.5 kg (243 lb 9.7 oz)] 110.5 kg (243 lb 9.7 oz) (06/01 0500)  Rhythm:  Junctional 60's under pacer.  He will A-pace but has long P-R interval.   Hemodynamic parameters for last 24 hours: PAP: (45-52)/(21-26) 49/23 mmHg  Intake/Output from previous day: 05/31 0701 - 06/01 0700 In: 1516.5 [P.O.:600; I.V.:816.5; IV Piggyback:100] Out: 905 [Urine:855; Chest Tube:50] Intake/Output this shift:    General appearance: alert and cooperative Neurologic: intact Heart: regular rate and rhythm, S1, S2 normal, no murmur, click, rub or gallop Lungs: clear to auscultation bilaterally Extremities: edema mild Wound: dressing dry  Lab Results:  Basename 06/16/11 0452 06/15/11 1739 06/15/11 1700  WBC 20.2* -- 22.9*  HGB 10.4* 12.9* --  HCT 34.2* 38.0* --  PLT 170 -- 180   BMET:  Basename 06/16/11 0452 06/15/11 1739 06/15/11 0406  NA 132* 134* --  K 4.8 4.9 --  CL 98 105 --  CO2 24 -- 24  GLUCOSE 140* 162* --  BUN 28* 24* --  CREATININE 1.37* 1.80* --  CALCIUM 8.2* -- 8.2*    PT/INR:  Basename 06/14/11 1630  LABPROT 16.5*  INR 1.31   ABG    Component Value Date/Time   PHART 7.329* 06/15/2011 0114   HCO3 22.8 06/15/2011 0114   TCO2 22 06/15/2011 1739   ACIDBASEDEF 3.0* 06/15/2011 0114   O2SAT 92.0 06/15/2011 0114   CBG (last 3)   Basename 06/16/11 0730 06/16/11 0403 06/16/11 0028  GLUCAP 161* 151* 140*   CXR:  Mild left base atelectasis  Assessment/Plan: S/P  Procedure(s) (LRB): CORONARY ARTERY BYPASS GRAFTING (CABG) (N/A) MAZE (N/A) Wean dopamint to 2 and continue today Renal function improving Mobilize Diuresis Diabetes control Continue foley due to diuresing patient and urinary output monitoring   LOS: 2 days    Edward [name] K 06/16/2011

## 2011-06-17 LAB — POCT I-STAT 3, ART BLOOD GAS (G3+)
Acid-Base Excess: 2 mmol/L (ref 0.0–2.0)
Bicarbonate: 28.5 mEq/L — ABNORMAL HIGH (ref 20.0–24.0)
O2 Saturation: 100 %
TCO2: 29 mmol/L (ref 0–100)
pCO2 arterial: 50.4 mmHg — ABNORMAL HIGH (ref 35.0–45.0)
pCO2 arterial: 50 mmHg — ABNORMAL HIGH (ref 35.0–45.0)
pH, Arterial: 7.363 (ref 7.350–7.450)
pO2, Arterial: 324 mmHg — ABNORMAL HIGH (ref 80.0–100.0)

## 2011-06-17 LAB — CBC
HCT: 32.4 % — ABNORMAL LOW (ref 39.0–52.0)
Hemoglobin: 10 g/dL — ABNORMAL LOW (ref 13.0–17.0)
MCH: 22.3 pg — ABNORMAL LOW (ref 26.0–34.0)
MCHC: 30.9 g/dL (ref 30.0–36.0)
MCV: 72.2 fL — ABNORMAL LOW (ref 78.0–100.0)
RBC: 4.49 MIL/uL (ref 4.22–5.81)

## 2011-06-17 LAB — POCT I-STAT 4, (NA,K, GLUC, HGB,HCT)
Glucose, Bld: 207 mg/dL — ABNORMAL HIGH (ref 70–99)
Glucose, Bld: 164 mg/dL — ABNORMAL HIGH (ref 70–99)
Glucose, Bld: 146 mg/dL — ABNORMAL HIGH (ref 70–99)
Glucose, Bld: 162 mg/dL — ABNORMAL HIGH (ref 70–99)
Glucose, Bld: 139 mg/dL — ABNORMAL HIGH (ref 70–99)
HCT: 39 % (ref 39.0–52.0)
HCT: 31 % — ABNORMAL LOW (ref 39.0–52.0)
HCT: 32 % — ABNORMAL LOW (ref 39.0–52.0)
HCT: 30 % — ABNORMAL LOW (ref 39.0–52.0)
HCT: 36 % — ABNORMAL LOW (ref 39.0–52.0)
Hemoglobin: 10.5 g/dL — ABNORMAL LOW (ref 13.0–17.0)
Hemoglobin: 10.9 g/dL — ABNORMAL LOW (ref 13.0–17.0)
Hemoglobin: 10.9 g/dL — ABNORMAL LOW (ref 13.0–17.0)
Hemoglobin: 10.2 g/dL — ABNORMAL LOW (ref 13.0–17.0)
Hemoglobin: 12.2 g/dL — ABNORMAL LOW (ref 13.0–17.0)
Potassium: 4.1 mEq/L (ref 3.5–5.1)
Potassium: 4.2 mEq/L (ref 3.5–5.1)
Potassium: 4.8 mEq/L (ref 3.5–5.1)
Potassium: 5.4 mEq/L — ABNORMAL HIGH (ref 3.5–5.1)
Potassium: 4.9 mEq/L (ref 3.5–5.1)
Sodium: 140 mEq/L (ref 135–145)
Sodium: 140 mEq/L (ref 135–145)
Sodium: 139 mEq/L (ref 135–145)
Sodium: 135 mEq/L (ref 135–145)
Sodium: 138 mEq/L (ref 135–145)

## 2011-06-17 LAB — GLUCOSE, CAPILLARY
Glucose-Capillary: 112 mg/dL — ABNORMAL HIGH (ref 70–99)
Glucose-Capillary: 115 mg/dL — ABNORMAL HIGH (ref 70–99)
Glucose-Capillary: 124 mg/dL — ABNORMAL HIGH (ref 70–99)
Glucose-Capillary: 152 mg/dL — ABNORMAL HIGH (ref 70–99)

## 2011-06-17 LAB — BASIC METABOLIC PANEL
BUN: 37 mg/dL — ABNORMAL HIGH (ref 6–23)
CO2: 26 mEq/L (ref 19–32)
Chloride: 97 mEq/L (ref 96–112)
GFR calc non Af Amer: 44 mL/min — ABNORMAL LOW (ref >90)
Glucose, Bld: 112 mg/dL — ABNORMAL HIGH (ref 70–99)
Potassium: 3.9 mEq/L (ref 3.5–5.1)
Sodium: 133 mEq/L — ABNORMAL LOW (ref 135–145)

## 2011-06-17 MED ORDER — HYDRALAZINE HCL 20 MG/ML IJ SOLN
10.0000 mg | INTRAMUSCULAR | Status: DC | PRN
  Administered 2011-06-17 – 2011-06-18 (×2): 10 mg via INTRAVENOUS
  Filled 2011-06-17 (×2): qty 0.5

## 2011-06-17 NOTE — Progress Notes (Signed)
3 Days Post-Op Procedure(s) (LRB): CORONARY ARTERY BYPASS GRAFTING (CABG) (N/A) MAZE (N/A) Subjective: No complaints  Objective: Vital signs in last 24 hours: Temp:  [97.5 F (36.4 C)-98.4 F (36.9 C)] 97.8 F (36.6 C) (06/02 1144) Pulse Rate:  [87-88] 87  (06/02 1100) Cardiac Rhythm:  [-] A-V Sequential paced (06/02 0800) Resp:  [9-22] 11  (06/02 1100) BP: (107-158)/(59-88) 120/76 mmHg (06/02 1100) SpO2:  [91 %-99 %] 95 % (06/02 1100) Weight:  [115 kg (253 lb 8.5 oz)] 115 kg (253 lb 8.5 oz) (06/02 0600)   Rhythm:  DDD paced.  He is slow junctional escape under pacer  Hemodynamic parameters for last 24 hours:    Intake/Output from previous day: 06/01 0701 - 06/02 0700 In: 1541.4 [P.O.:1140; I.V.:343.4; IV Piggyback:58] Out: 1335 [Urine:1335] Intake/Output this shift: Total I/O In: 452.9 [P.O.:360; I.V.:92.9] Out: 265 [Urine:265]  General appearance: alert and cooperative Neurologic: intact Heart: regular rate and rhythm, S1, S2 normal, no murmur, click, rub or gallop Lungs: clear to auscultation bilaterally Extremities: edema mild Wound: healing well  Lab Results:  Basename 06/17/11 0525 06/16/11 0452  WBC 14.5* 20.2*  HGB 10.0* 10.4*  HCT 32.4* 34.2*  PLT 159 170   BMET:  Basename 06/17/11 0525 06/16/11 0452  NA 133* 132*  K 3.9 4.8  CL 97 98  CO2 26 24  GLUCOSE 112* 140*  BUN 37* 28*  CREATININE 1.54* 1.37*  CALCIUM 8.3* 8.2*    PT/INR:  Basename 06/14/11 1630  LABPROT 16.5*  INR 1.31   ABG    Component Value Date/Time   PHART 7.329* 06/15/2011 0114   HCO3 22.8 06/15/2011 0114   TCO2 22 06/15/2011 1739   ACIDBASEDEF 3.0* 06/15/2011 0114   O2SAT 92.0 06/15/2011 0114   CBG (last 3)   Basename 06/17/11 1142 06/17/11 0743 06/17/11 0405  GLUCAP 140* 115* 102*    Assessment/Plan: S/P Procedure(s) (LRB): CORONARY ARTERY BYPASS GRAFTING (CABG) (N/A) MAZE (N/A) Mobilize Diabetes control Creatinine up slightly after diuresis yesterday. Will  observe for now.  He has significant volume excess. Slow junctional rhythm in 30's.  He may need PPM.  Will ask EP to see tomorrow.   LOS: 3 days    Cordero Surette K 06/17/2011

## 2011-06-18 ENCOUNTER — Encounter: Payer: Federal, State, Local not specified - PPO | Admitting: Internal Medicine

## 2011-06-18 LAB — BASIC METABOLIC PANEL
CO2: 22 mEq/L (ref 19–32)
Chloride: 94 mEq/L — ABNORMAL LOW (ref 96–112)
Creatinine, Ser: 1.2 mg/dL (ref 0.50–1.35)
GFR calc Af Amer: 69 mL/min — ABNORMAL LOW (ref >90)
Sodium: 129 mEq/L — ABNORMAL LOW (ref 135–145)

## 2011-06-18 LAB — GLUCOSE, CAPILLARY
Glucose-Capillary: 167 mg/dL — ABNORMAL HIGH (ref 70–99)
Glucose-Capillary: 139 mg/dL — ABNORMAL HIGH (ref 70–99)

## 2011-06-18 MED ORDER — SIMVASTATIN 40 MG PO TABS
40.0000 mg | ORAL_TABLET | Freq: Every day | ORAL | Status: DC
  Filled 2011-06-18: qty 1

## 2011-06-18 MED ORDER — PANTOPRAZOLE SODIUM 40 MG PO TBEC
40.0000 mg | DELAYED_RELEASE_TABLET | Freq: Every day | ORAL | Status: DC
  Administered 2011-06-19 – 2011-06-22 (×4): 40 mg via ORAL
  Filled 2011-06-18 (×3): qty 1

## 2011-06-18 MED ORDER — INSULIN ASPART 100 UNIT/ML ~~LOC~~ SOLN
0.0000 [IU] | Freq: Three times a day (TID) | SUBCUTANEOUS | Status: DC
  Administered 2011-06-18 – 2011-06-19 (×5): 2 [IU] via SUBCUTANEOUS
  Administered 2011-06-19: 4 [IU] via SUBCUTANEOUS
  Administered 2011-06-20: 12 [IU] via SUBCUTANEOUS
  Administered 2011-06-20: 2 [IU] via SUBCUTANEOUS
  Administered 2011-06-21: 4 [IU] via SUBCUTANEOUS
  Administered 2011-06-21 (×3): 2 [IU] via SUBCUTANEOUS

## 2011-06-18 MED ORDER — BISACODYL 10 MG RE SUPP: 10.0000 mg | Freq: Every day | RECTAL | Status: DC | PRN

## 2011-06-18 MED ORDER — ALPRAZOLAM 0.5 MG PO TABS
0.5000 mg | ORAL_TABLET | Freq: Two times a day (BID) | ORAL | Status: DC
  Administered 2011-06-18 – 2011-06-22 (×9): 0.5 mg via ORAL
  Filled 2011-06-18 (×9): qty 1

## 2011-06-18 MED ORDER — SODIUM CHLORIDE 0.9 % IJ SOLN
3.0000 mL | Freq: Two times a day (BID) | INTRAMUSCULAR | Status: DC
Start: 2011-06-18 — End: 2011-06-22
  Administered 2011-06-18 – 2011-06-22 (×8): 3 mL via INTRAVENOUS

## 2011-06-18 MED ORDER — PRAVASTATIN SODIUM 40 MG PO TABS
80.0000 mg | ORAL_TABLET | Freq: Every day | ORAL | Status: DC
  Administered 2011-06-18 – 2011-06-21 (×4): 80 mg via ORAL
  Filled 2011-06-18 (×5): qty 2

## 2011-06-18 MED ORDER — POTASSIUM CHLORIDE CRYS ER 20 MEQ PO TBCR
40.0000 meq | EXTENDED_RELEASE_TABLET | Freq: Every day | ORAL | Status: DC
  Administered 2011-06-18 – 2011-06-20 (×2): 40 meq via ORAL
  Filled 2011-06-18 (×4): qty 2

## 2011-06-18 MED ORDER — DOCUSATE SODIUM 100 MG PO CAPS
200.0000 mg | ORAL_CAPSULE | Freq: Every day | ORAL | Status: DC
  Administered 2011-06-19 – 2011-06-20 (×2): 200 mg via ORAL
  Filled 2011-06-18 (×4): qty 2

## 2011-06-18 MED ORDER — SODIUM CHLORIDE 0.9 % IV SOLN: 250.0000 mL | INTRAVENOUS | Status: DC | PRN

## 2011-06-18 MED ORDER — OXYCODONE HCL 5 MG PO TABS
5.0000 mg | ORAL_TABLET | ORAL | Status: DC | PRN
  Administered 2011-06-18 – 2011-06-20 (×5): 10 mg via ORAL
  Filled 2011-06-18 (×6): qty 2

## 2011-06-18 MED ORDER — MOVING RIGHT ALONG BOOK
Freq: Once | Status: AC
  Administered 2011-06-18: 10:00:00
  Filled 2011-06-18: qty 1

## 2011-06-18 MED ORDER — FUROSEMIDE 80 MG PO TABS
80.0000 mg | ORAL_TABLET | Freq: Every day | ORAL | Status: DC
  Administered 2011-06-18: 80 mg via ORAL
  Filled 2011-06-18 (×2): qty 1

## 2011-06-18 MED ORDER — ONDANSETRON HCL 4 MG/2ML IJ SOLN: 4.0000 mg | Freq: Four times a day (QID) | INTRAMUSCULAR | Status: DC | PRN

## 2011-06-18 MED ORDER — SODIUM CHLORIDE 0.9 % IJ SOLN: 3.0000 mL | INTRAMUSCULAR | Status: DC | PRN

## 2011-06-18 MED ORDER — METOLAZONE 10 MG PO TABS
10.0000 mg | ORAL_TABLET | Freq: Every day | ORAL | Status: DC
  Administered 2011-06-18: 10 mg via ORAL
  Filled 2011-06-18 (×2): qty 1

## 2011-06-18 MED ORDER — ASPIRIN EC 81 MG PO TBEC
81.0000 mg | DELAYED_RELEASE_TABLET | Freq: Every day | ORAL | Status: DC
  Administered 2011-06-19 – 2011-06-22 (×4): 81 mg via ORAL
  Filled 2011-06-18 (×5): qty 1

## 2011-06-18 MED ORDER — AMLODIPINE BESYLATE 5 MG PO TABS
5.0000 mg | ORAL_TABLET | Freq: Every day | ORAL | Status: DC
  Administered 2011-06-18 – 2011-06-22 (×5): 5 mg via ORAL
  Filled 2011-06-18 (×5): qty 1

## 2011-06-18 MED ORDER — BISACODYL 5 MG PO TBEC: 10.0000 mg | DELAYED_RELEASE_TABLET | Freq: Every day | ORAL | Status: DC | PRN

## 2011-06-18 MED ORDER — TRAMADOL HCL 50 MG PO TABS
50.0000 mg | ORAL_TABLET | ORAL | Status: DC | PRN
  Administered 2011-06-18: 100 mg via ORAL
  Filled 2011-06-18: qty 2

## 2011-06-18 MED ORDER — ONDANSETRON HCL 4 MG PO TABS: 4.0000 mg | ORAL_TABLET | Freq: Four times a day (QID) | ORAL | Status: DC | PRN

## 2011-06-18 NOTE — Progress Notes (Signed)
Utilization review complete.   Lionel December, RN, BSN Phone [phone]

## 2011-06-18 NOTE — Progress Notes (Signed)
Pt transferred via wheelchair on telemetry to 0379 without complication.  VS remain stable and patient tolerated well.  Report given to receiving RN.

## 2011-06-18 NOTE — Progress Notes (Signed)
CARDIAC REHAB PHASE I   PRE:  Rate/Rhythm: 70 paced    BP: sitting 135/76    SaO2: 98 RA  MODE:  Ambulation: 350 ft   POST:  Rate/Rhythm: 70 paced    BP: sitting 157/79     SaO2: 98-100  RA  Pt declined RW. Tolerated fair. Tired toward end of walk. VSS. To bed. Requested pain med in hopes of sleeping. Encouraged 3rd walk this pm and IS. [phone] Darrick Meigs CES, ACSM

## 2011-06-18 NOTE — Care Management Note (Unsigned)
    Page 1 of 1   06/21/2011     2:35:58 PM   CARE MANAGEMENT NOTE 06/21/2011  Patient:  Edward [name], Edward [name]   Account Number:  [id number]  Date Initiated:  06/18/2011  Documentation initiated by:  Haiden Clucas  Subjective/Objective Assessment:   PT S/P CABG X 6 ON 06/14/11.  PTA, PT INDEPENDENT, LIVES WITH SUPPORTIVE WIFE.     Action/Plan:   MET WITH PT AND WIFE TO DISCUSS DC PLANS.  WIFE TO PROVIDE 24HR CARE AT DC.  PT DENIES NEED FOR RW, THOUGH WIFE THINKS THEY HAVE ONE AT HOME.  WILL FOLLOW FOR HOME NEEDS.   Anticipated DC Date:  06/19/2011   Anticipated DC Plan:  [place]  CM consult      Choice offered to / List presented to:             Status of service:  In process, will continue to follow Medicare Important Message given?   (If response is "NO", the following Medicare IM given date fields will be blank) Date Medicare IM given:   Date Additional Medicare IM given:    Discharge Disposition:    Per UR Regulation:    If discussed at Long Length of Stay Meetings, dates discussed:    Comments:  06/21/11 Homer Miller,RN,BSN [place] ON 06/20/11.  PT DOING WELL POST-PROCEDURE.  PT TO DC HOME WITH WIFE, AMBULATING WELL WITHOUT ASSISTIVE DEVICE.  POSSIBLE DC HOME TOMORROW.

## 2011-06-18 NOTE — Progress Notes (Signed)
Pt B/P continues to be elevated, currently 198/98. Dr Cyndia Bent notified, orders received to give Hydralazine prn.

## 2011-06-18 NOTE — Progress Notes (Signed)
4 Days Post-Op Procedure(s) (LRB): CORONARY ARTERY BYPASS GRAFTING (CABG) (N/A) MAZE (N/A) Subjective: Pain meds not working. Not sleeping. Feels anxious  Objective: Vital signs in last 24 hours: Temp:  [97.8 F (36.6 C)-98.5 F (36.9 C)] 98.5 F (36.9 C) (06/03 0400) Pulse Rate:  [79-88] 79  (06/03 0700) Cardiac Rhythm:  [-] A-V Sequential paced (06/03 0700) Resp:  [10-23] 16  (06/03 0700) BP: (120-198)/(76-98) 179/78 mmHg (06/03 0600) SpO2:  [92 %-99 %] 97 % (06/03 0700) Weight:  [115.3 kg (254 lb 3.1 oz)] 115.3 kg (254 lb 3.1 oz) (06/03 0700)  Rhythm: under pacer he is junctional 50.  Hemodynamic parameters for last 24 hours:    Intake/Output from previous day: 06/02 0701 - 06/03 0700 In: 1122.9 [P.O.:1010; I.V.:112.9] Out: 1250 [Urine:1250] Intake/Output this shift:    General appearance: alert and cooperative Neurologic: intact Heart: regular rate and rhythm, S1, S2 normal, no murmur, click, rub or gallop Lungs: clear to auscultation bilaterally Extremities: edema mild Wound: incision ok  Lab Results:  Basename 06/17/11 0525 06/16/11 0452  WBC 14.5* 20.2*  HGB 10.0* 10.4*  HCT 32.4* 34.2*  PLT 159 170   BMET:  Basename 06/18/11 0435 06/17/11 0525  NA 129* 133*  K 4.0 3.9  CL 94* 97  CO2 22 26  GLUCOSE 140* 112*  BUN 36* 37*  CREATININE 1.20 1.54*  CALCIUM 8.5 8.3*    PT/INR: No results found for this basename: LABPROT,INR in the last 72 hours ABG    Component Value Date/Time   PHART 7.329* 06/15/2011 0114   HCO3 22.8 06/15/2011 0114   TCO2 22 06/15/2011 1739   ACIDBASEDEF 3.0* 06/15/2011 0114   O2SAT 92.0 06/15/2011 0114   CBG (last 3)   Basename 06/18/11 0431 06/17/11 2345 06/17/11 1946  GLUCAP 137* 112* 152*    Assessment/Plan: S/P Procedure(s) (LRB): CORONARY ARTERY BYPASS GRAFTING (CABG) (N/A) MAZE (N/A) Mobilize Diuresis Diabetes control Plan for transfer to step-down: see transfer orders He is in junctional brady at 50.  Will  continue pacing for now to maximize cardiac output while diuresing. Continue observing. Check ECG.  Xanax for anxiety   LOS: 4 days    Bertil Brickey K 06/18/2011

## 2011-06-19 ENCOUNTER — Encounter (HOSPITAL_COMMUNITY): Payer: Self-pay | Admitting: Surgery

## 2011-06-19 DIAGNOSIS — I495 Sick sinus syndrome: Secondary | ICD-10-CM

## 2011-06-19 DIAGNOSIS — I4891 Unspecified atrial fibrillation: Secondary | ICD-10-CM

## 2011-06-19 LAB — BASIC METABOLIC PANEL
BUN: 27 mg/dL — ABNORMAL HIGH (ref 6–23)
CO2: 27 mEq/L (ref 19–32)
Calcium: 8.7 mg/dL (ref 8.4–10.5)
Creatinine, Ser: 1.16 mg/dL (ref 0.50–1.35)
Glucose, Bld: 106 mg/dL — ABNORMAL HIGH (ref 70–99)

## 2011-06-19 LAB — GLUCOSE, CAPILLARY
Glucose-Capillary: 139 mg/dL — ABNORMAL HIGH (ref 70–99)
Glucose-Capillary: 131 mg/dL — ABNORMAL HIGH (ref 70–99)

## 2011-06-19 MED ORDER — LOSARTAN POTASSIUM 50 MG PO TABS
50.0000 mg | ORAL_TABLET | Freq: Every day | ORAL | Status: DC
  Administered 2011-06-19: 50 mg via ORAL
  Filled 2011-06-19 (×2): qty 1

## 2011-06-19 MED ORDER — POTASSIUM CHLORIDE CRYS ER 20 MEQ PO TBCR
40.0000 meq | EXTENDED_RELEASE_TABLET | Freq: Once | ORAL | Status: AC
  Administered 2011-06-19: 40 meq via ORAL
  Filled 2011-06-19: qty 2

## 2011-06-19 MED ORDER — SODIUM CHLORIDE 0.9 % IR SOLN
80.0000 mg | Status: AC
  Administered 2011-06-20: 80 mg
  Filled 2011-06-19: qty 2

## 2011-06-19 MED ORDER — METOLAZONE 5 MG PO TABS
5.0000 mg | ORAL_TABLET | Freq: Every day | ORAL | Status: AC
  Administered 2011-06-19: 5 mg via ORAL
  Filled 2011-06-19: qty 1

## 2011-06-19 MED ORDER — CHLORHEXIDINE GLUCONATE 4 % EX LIQD
60.0000 mL | Freq: Once | CUTANEOUS | Status: AC
  Administered 2011-06-19: 4 via TOPICAL
  Filled 2011-06-19: qty 60

## 2011-06-19 MED ORDER — METFORMIN HCL 500 MG PO TABS
500.0000 mg | ORAL_TABLET | Freq: Two times a day (BID) | ORAL | Status: DC
  Administered 2011-06-19 – 2011-06-21 (×4): 500 mg via ORAL
  Filled 2011-06-19 (×7): qty 1

## 2011-06-19 MED ORDER — POTASSIUM CHLORIDE CRYS ER 20 MEQ PO TBCR
30.0000 meq | EXTENDED_RELEASE_TABLET | Freq: Once | ORAL | Status: AC
  Administered 2011-06-19: 30 meq via ORAL
  Filled 2011-06-19: qty 1

## 2011-06-19 MED ORDER — SODIUM CHLORIDE 0.9 % IV SOLN
INTRAVENOUS | Status: DC
  Administered 2011-06-20 (×2): via INTRAVENOUS

## 2011-06-19 MED ORDER — FUROSEMIDE 40 MG PO TABS
40.0000 mg | ORAL_TABLET | Freq: Every day | ORAL | Status: DC
  Administered 2011-06-19 – 2011-06-20 (×2): 40 mg via ORAL
  Filled 2011-06-19 (×3): qty 1

## 2011-06-19 MED ORDER — CEFAZOLIN SODIUM-DEXTROSE 2-3 GM-% IV SOLR
2.0000 g | INTRAVENOUS | Status: AC
  Administered 2011-06-20: 2 g via INTRAVENOUS
  Filled 2011-06-19: qty 50

## 2011-06-19 MED ORDER — SODIUM CHLORIDE 0.45 % IV SOLN
INTRAVENOUS | Status: DC
  Administered 2011-06-20: 06:00:00 via INTRAVENOUS

## 2011-06-19 MED ORDER — CHLORHEXIDINE GLUCONATE 4 % EX LIQD
60.0000 mL | Freq: Once | CUTANEOUS | Status: AC
  Administered 2011-06-20: 4 via TOPICAL
  Filled 2011-06-19: qty 60

## 2011-06-19 MED FILL — Mannitol IV Soln 20%: INTRAVENOUS | Qty: 500 | Status: AC

## 2011-06-19 MED FILL — Heparin Sodium (Porcine) Inj 1000 Unit/ML: INTRAMUSCULAR | Qty: 30 | Status: AC

## 2011-06-19 MED FILL — Sodium Chloride Irrigation Soln 0.9%: Qty: 3000 | Status: AC

## 2011-06-19 MED FILL — Electrolyte-R (PH 7.4) Solution: INTRAVENOUS | Qty: 6000 | Status: AC

## 2011-06-19 MED FILL — Lidocaine HCl IV Inj 20 MG/ML: INTRAVENOUS | Qty: 5 | Status: AC

## 2011-06-19 MED FILL — Sodium Bicarbonate IV Soln 8.4%: INTRAVENOUS | Qty: 50 | Status: AC

## 2011-06-19 MED FILL — Sodium Chloride IV Soln 0.9%: INTRAVENOUS | Qty: 1000 | Status: AC

## 2011-06-19 NOTE — Progress Notes (Signed)
Pt ambulated 500 feet in hallway with RN without any difficulty; will cont. To monitor.

## 2011-06-19 NOTE — Progress Notes (Signed)
Pt ambulated 500 feet in hallway with RN without difficulty; will cont. To monitor.

## 2011-06-19 NOTE — Progress Notes (Signed)
Pt ambulated 400 feet in hallway with RN without difficulty; will cont. To monitor.

## 2011-06-19 NOTE — Progress Notes (Signed)
K 3.3 this AM; PA paged to make aware; will await callback.

## 2011-06-19 NOTE — Consult Note (Signed)
ELECTROPHYSIOLOGY CONSULT NOTE  Patient ID: Edward [name] MRN: [id number], DOB/AGE: 05/10/1940   Admit date: 06/14/2011 Date of Consult: 06/19/2011  Primary Physician: Lou Miner, MD Primary Cardiologist: Acie Fredrickson, MD Reason for Consultation: Junctional bradycardia  History of Present Illness Edward [name] is a pleasant 71 year old gentleman with CAD, preserved LV function, atrial fibrillation, HTN and PVD who underwent elective CABG with MAZE procedure and ligation of LAA on 06/14/2011. He is post-op day #5. He has experienced heart rates in 60s felt to represent a junctional bradycardia and he has a temporary transvenous pacing wire in place. This morning, his temporary pacing was turned down to 50 and he has an underlying rhythm in the mid 60s, ? atrial fibrillation. Edward [name] has no complaints. He denies chest pain, shortness of breath or palpitations. He denies dizziness, near-syncope or syncope.     Past Medical History  Diagnosis Date   CAD s/p CABG x6 - LIMA to LAD, SVG-D1, SVG-Cx, SVG-PD-PL   . A-fib   . HTN (hypertension)   . Diabetes mellitus   . Dyslipidemia   . PVD (peripheral vascular disease)     s/p Right CEA March, 2011,   . Claudication   . Hyperlipidemia   . GERD (gastroesophageal reflux disease)   . Diabetic neuropathy     Past Surgical History  Procedure Date  . Carotid endarterectomy     RIGHT  . Cholecystectomy   . Tonsillectomy and adenoidectomy   . Knee surgery   . Coronary artery bypass graft 06/14/2011    Procedure: CORONARY ARTERY BYPASS GRAFTING (CABG);  Surgeon: Gaye Pollack, MD;  Location: [facility];  Service: Open Heart Surgery;  Laterality: N/A;  Coronary Artery Bypass Graft on pump times six;  utilizing internal mammary artery and right greater saphenous vein harvested endoscopically.  . Maze 06/14/2011    Procedure: MAZE;  Surgeon: Gaye Pollack, MD;  Location: [facility];  Service: Open Heart Surgery;  Laterality: N/A;  Ligate left atrial appendage     Allergies/Intolerances Allergen Reactions  . Fenofibrate Other (See Comments)    Reaction unknown  . Lipitor (Atorvastatin Calcium) Other (See Comments)    Reaction unknown  . Morphine And Related Itching   Inpatient Medications . ALPRAZolam  0.5 mg Oral BID  . amLODipine  5 mg Oral Daily  . aspirin EC  81 mg Oral Daily  . docusate sodium  200 mg Oral Daily  . furosemide  40 mg Oral Q breakfast  . insulin aspart  0-24 Units Subcutaneous TID AC & HS  . losartan  50 mg Oral Daily  . metFORMIN  500 mg Oral BID WC  . metolazone  5 mg Oral Q breakfast  . pantoprazole  40 mg Oral QAC breakfast  . potassium chloride  30 mEq Oral Once  . potassium chloride  40 mEq Oral Daily  . potassium chloride  40 mEq Oral Once  . pravastatin  80 mg Oral q1800  . sodium chloride  3 mL Intravenous Q12H   Family History Problem Relation Age of Onset  . Coronary artery disease Father   . Hypertension Father   . Heart attack Mother   . Coronary artery disease Mother   . Hypertension Sister   . Anesthesia problems Neg Hx    Social History Social History  . Marital Status: Married    Spouse Name: N/A    Number of Children: N/A  . Years of Education: N/A   Social History Main Topics  .  Smoking status: Former Smoker    Types: Cigarettes    Quit date: 01/16/1979  . Smokeless tobacco: Never Used  . Alcohol Use: No     OCC.  . Drug Use: No  . Sexually Active: Not on file   Review of Systems General:  No chills, fever, night sweats or weight changes  Cardiovascular:  No chest pain, dyspnea on exertion, edema, orthopnea, palpitations, paroxysmal nocturnal dyspnea Dermatological: No rash, lesions or masses Respiratory: No cough, dyspnea Urologic: No hematuria, dysuria Abdominal:   No nausea, vomiting, diarrhea, bright red blood per rectum, melena, or hematemesis Neurologic:  No visual changes, weakness, changes in mental status All other systems reviewed and are otherwise negative except as  noted above.  Physical Exam Blood pressure 121/53, pulse 62, temperature 97.3 F (36.3 C), temperature source Oral, resp. rate 18, height 6' (1.829 m), weight 245 lb 1.6 oz (111.177 kg), SpO2 96.00%.  General: Well developed, well appearing, in no acute distress. HEENT: Normocephalic, atraumatic. EOMs intact. Sclera nonicteric. Oropharynx clear.  Neck: Supple without bruits. No JVD. Lungs:  Respirations regular and unlabored, CTA bilaterally. Heart: RRR. S1, S2 present. No murmurs, rub, S3 or S4. Abdomen: Soft, non-tender, non-distended. BS present x 4 quadrants. No hepatosplenomegaly.  Extremities: No clubbing, cyanosis +2 edema. DP/PT/Radials 2+ and equal bilaterally. Psych: Normal affect. Neuro: Alert and oriented X 3. Moves all extremities spontaneously.   Labs Lab Results  Component Value Date   WBC 14.5* 06/17/2011   HGB 10.0* 06/17/2011   HCT 32.4* 06/17/2011   MCV 72.2* 06/17/2011   PLT 159 06/17/2011    Lab 06/19/11 0615  NA 131*  K 3.3*  CL 92*  CO2 27  BUN 27*  CREATININE 1.16  CALCIUM 8.7  PROT --  BILITOT --  ALKPHOS --  ALT --  AST --  GLUCOSE 106*    Diagnostics/Studies 12-lead ECG junctional rhythm Telemetry junctional rhythm 60 bpm  Assessment and Plan  1. Sick sinus syndrome- Mr [name] continues to have bradycardia s/p recent CABG and MAZE.  Though his atrial threshold is high, I can still capture his atria with epicardial pacing.  He is not presently in afib.  The question long term is will his atrial activity return on its own or will he require a PPM.  I think that it is likely that he will require a pacemaker.  As his has had significant improvements in heart rates over the past 24 hours, I think that we should wait until tomorrow to commit to pacing, though I ancipitate that this will be likely and will therefore tentatively schedule for PPM implant with Dr Caryl Comes tomorrow afternoon. Risks, benefits, alternatives to pacemaker implantation were discussed in  detail with the patient and his spouse today. The patient understands that the risks include but are not limited to bleeding, infection, pneumothorax, perforation, tamponade, vascular damage, renal failure, MI, stroke, death,  and lead dislodgement and wishes to proceed if necessary.

## 2011-06-19 NOTE — Progress Notes (Addendum)
5 Days Post-Op Procedure(s) (LRB): CORONARY ARTERY BYPASS GRAFTING (CABG) (N/A) MAZE (N/A)  Subjective: Patient without complaints this am.  Objective: Vital signs in last 24 hours: Patient Vitals for the past 24 hrs:  BP Temp Temp src Pulse Resp SpO2 Weight  06/19/11 0600 170/83 mmHg - - 71  - - -  06/19/11 0429 170/87 mmHg 98.5 F (36.9 C) Oral 73  18  93 % 245 lb 1.6 oz (111.177 kg)  06/18/11 2042 155/75 mmHg 98.5 F (36.9 C) Oral 70  16  95 % -  06/18/11 1325 153/80 mmHg 97.4 F (36.3 C) Oral 70  18  97 % -  06/18/11 0924 136/49 mmHg 98.2 F (36.8 C) Oral 70  16  98 % -  06/18/11 0900 135/59 mmHg - - 70  13  99 % -  06/18/11 0800 152/65 mmHg - - 70  15  99 % -  06/18/11 0758 - 98.3 F (36.8 C) Oral - - - -   Pre op weight  108 kg Current Weight  06/19/11 245 lb 1.6 oz (111.177 kg)      Intake/Output from previous day: 06/03 0701 - 06/04 0700 In: 1323 [P.O.:1320; I.V.:3] Out: 4775 [Urine:4775]   Physical Exam:  Cardiovascular: RRR;no murmurs, gallops, or rubs. Pulmonary: Clear to auscultation bilaterally; no rales, wheezes, or rhonchi. Abdomen: Soft, non tender, bowel sounds present. Extremities: Mild bilateral lower extremity edema. Wounds: Clean and dry.  No erythema or signs of infection.  Lab Results: CBC: Basename 06/17/11 0525  WBC 14.5*  HGB 10.0*  HCT 32.4*  PLT 159   BMET:  Basename 06/18/11 0435 06/17/11 0525  NA 129* 133*  K 4.0 3.9  CL 94* 97  CO2 22 26  GLUCOSE 140* 112*  BUN 36* 37*  CREATININE 1.20 1.54*  CALCIUM 8.5 8.3*    PT/INR: No results found for this basename: LABPROT,INR in the last 72 hours ABG:  INR: Will add last result for INR, ABG once components are confirmed Will add last 4 CBG results once components are confirmed  Assessment/Plan:  1. CV - Previously junctinal.AV paced this am.Appears that pacer is at times not capturing. External pacer is set at 70. Pacer box switched and now has 100% capture.May need EPS  consult. Blood pressure is elevated.Will continue Norvasc and restart low dose Cozaar. 2.  Pulmonary - Encourage incentive spirometer. 3. Volume Overload - Continue with diuresis (Lasix and Zaroxolyn) 4.  Acute blood loss anemia - Last H/H 10 and 32.4. 5.DM-HGA1C 6.6.CBGs 126/135/118.Currently, on scheduled insulin. Will restart metformin and continue with insulin PRN. Will need follow up as an outpatient.   ZIMMERMAN,DONIELLE MPA-C 06/19/2011    Chart reviewed, patient examined, agree with above. Excellent diuresis yesterday.  Will decrease lasix and zaroxolyn dose today. Rhythm under pacer today is junctional 65.  Will switch pacer to VVI 50 and observe. Check ECG to document rhythm. I will ask EP to see him.

## 2011-06-19 NOTE — Progress Notes (Signed)
PA returned page; new orders to be entered.

## 2011-06-19 NOTE — Progress Notes (Signed)
CARDIAC REHAB PHASE I   PRE:  Rate/Rhythm: 62 JR  BP:  Supine:   Sitting: 121/53  Standing:    SaO2: 96 RA  MODE:  Ambulation: 550 ft   POST:  Rate/Rhythem: 65 JR  BP:  Supine:   Sitting: 139/57  Standing:    SaO2: 96 RA 1345-1420 Assisted X 1 to ambulate with hand held assist. Gait fairly steady, VS stable. Walked 550 feet without c/o. Pt back to chair after walk with call light in reach.  Deon Pilling

## 2011-06-20 ENCOUNTER — Encounter (HOSPITAL_COMMUNITY)
Admission: RE | Disposition: A | Discharge status: Home / Self Care | Payer: Self-pay | Source: Ambulatory Visit | Attending: Surgery

## 2011-06-20 DIAGNOSIS — I498 Other specified cardiac arrhythmias: Secondary | ICD-10-CM

## 2011-06-20 HISTORY — PX: PERMANENT PACEMAKER INSERTION: SHX5480

## 2011-06-20 LAB — BASIC METABOLIC PANEL
BUN: 25 mg/dL — ABNORMAL HIGH (ref 6–23)
BUN: 21 mg/dL (ref 6–23)
CO2: 29 mEq/L (ref 19–32)
Calcium: 8.9 mg/dL (ref 8.4–10.5)
Chloride: 90 mEq/L — ABNORMAL LOW (ref 96–112)
Creatinine, Ser: 1.19 mg/dL (ref 0.50–1.35)
Creatinine, Ser: 1.14 mg/dL (ref 0.50–1.35)
GFR calc Af Amer: 70 mL/min — ABNORMAL LOW (ref >90)
GFR calc non Af Amer: 61 mL/min — ABNORMAL LOW (ref >90)
Glucose, Bld: 178 mg/dL — ABNORMAL HIGH (ref 70–99)

## 2011-06-20 LAB — MAGNESIUM: Magnesium: 1.4 mg/dL — ABNORMAL LOW (ref 1.5–2.5)

## 2011-06-20 LAB — GLUCOSE, CAPILLARY: Glucose-Capillary: 114 mg/dL — ABNORMAL HIGH (ref 70–99)

## 2011-06-20 SURGERY — PERMANENT PACEMAKER INSERTION
Anesthesia: LOCAL

## 2011-06-20 MED ORDER — POTASSIUM CHLORIDE CRYS ER 20 MEQ PO TBCR
40.0000 meq | EXTENDED_RELEASE_TABLET | Freq: Once | ORAL | Status: AC
  Administered 2011-06-20: 40 meq via ORAL

## 2011-06-20 MED ORDER — LIDOCAINE HCL (PF) 1 % IJ SOLN
INTRAMUSCULAR | Status: AC
  Filled 2011-06-20: qty 60

## 2011-06-20 MED ORDER — MIDAZOLAM HCL 5 MG/5ML IJ SOLN
INTRAMUSCULAR | Status: AC
  Filled 2011-06-20: qty 5

## 2011-06-20 MED ORDER — LOSARTAN POTASSIUM 50 MG PO TABS
50.0000 mg | ORAL_TABLET | Freq: Every day | ORAL | Status: DC
  Administered 2011-06-20 – 2011-06-22 (×3): 50 mg via ORAL
  Filled 2011-06-20 (×3): qty 1

## 2011-06-20 MED ORDER — POTASSIUM CHLORIDE 10 MEQ/100ML IV SOLN
10.0000 meq | Freq: Once | INTRAVENOUS | Status: AC
  Administered 2011-06-20: 10 meq via INTRAVENOUS
  Filled 2011-06-20: qty 100

## 2011-06-20 MED ORDER — LOSARTAN POTASSIUM 50 MG PO TABS: 50.0000 mg | ORAL_TABLET | Freq: Every day | ORAL | Status: DC

## 2011-06-20 MED ORDER — ACETAMINOPHEN 325 MG PO TABS: 325.0000 mg | ORAL_TABLET | ORAL | Status: DC | PRN

## 2011-06-20 MED ORDER — HEPARIN (PORCINE) IN NACL 2-0.9 UNIT/ML-% IJ SOLN
INTRAMUSCULAR | Status: AC
  Filled 2011-06-20: qty 1000

## 2011-06-20 MED ORDER — CEFAZOLIN SODIUM 1-5 GM-% IV SOLN
1.0000 g | Freq: Four times a day (QID) | INTRAVENOUS | Status: AC
  Administered 2011-06-20 – 2011-06-21 (×3): 1 g via INTRAVENOUS
  Filled 2011-06-20 (×3): qty 50

## 2011-06-20 MED ORDER — ONDANSETRON HCL 4 MG/2ML IJ SOLN: 4.0000 mg | Freq: Four times a day (QID) | INTRAMUSCULAR | Status: DC | PRN

## 2011-06-20 MED ORDER — POTASSIUM CHLORIDE CRYS ER 20 MEQ PO TBCR
30.0000 meq | EXTENDED_RELEASE_TABLET | Freq: Once | ORAL | Status: AC
  Administered 2011-06-20: 30 meq via ORAL
  Filled 2011-06-20 (×2): qty 1

## 2011-06-20 MED ORDER — SODIUM CHLORIDE 0.9 % IV SOLN
INTRAVENOUS | Status: AC
  Administered 2011-06-20: 14:00:00 via INTRAVENOUS

## 2011-06-20 MED ORDER — FENTANYL CITRATE 0.05 MG/ML IJ SOLN
INTRAMUSCULAR | Status: AC
  Filled 2011-06-20: qty 2

## 2011-06-20 MED ORDER — LOSARTAN POTASSIUM 50 MG PO TABS: 100.0000 mg | ORAL_TABLET | Freq: Every day | ORAL | Status: DC

## 2011-06-20 MED ORDER — OXYCODONE HCL 5 MG PO TABS: 5.0000 mg | ORAL_TABLET | ORAL | Status: AC | PRN

## 2011-06-20 NOTE — CV Procedure (Signed)
Preop DX:: sinus node dysfunction Post op DX:: same  Procedure  dual pacemaker implantation  After routine prep and drape, lidocaine was infiltrated in the prepectoral subclavicular region on the left side an incision was made and carried down to later the prepectoral fascia using electrocautery and sharp dissection a pocket was formed similarly. Hemostasis was obtained.  After this, we turned our attention to gaining accessm to the extrathoracic,left subclavian vein. This was accomplished without difficulty and without the aspiration of air or puncture of the artery. 2 separate venipunctures were accomplished; guidewires were placed and retained and sequentially 7 French sheath through which were  passed an [place] ventricular lead serial number [id number] and an .medtronic 5076  atrial lead serial number [id number] .  The ventricular lead was manipulated to the right ventricular apex with a bipolar R wave was 18 mv, the pacing impedance was 690, the threshold was 0.4 @ 0.0.5 msec  Current at threshold was  0.8 ma  The right atrial lead was manipulated to the right atrial free wall as no electrical activity detected in atrial appendage with a bipolar P-wave  1.6, the pacing impedance was 631, the threshold 1.5@ 0.5 msec   Current at threshold was 2.5 and the current of injury was brisk.   The leads were affixed to the prepectoral fascia and attached to a  Medtronic Adapta L pulse generator serial number [id number].  Hemostasis was obtained. The pocket was copiously irrigated with antibiotic containing saline solution. The leads and the pulse generator were placed in the pocket and affixed to the prepectoral fascia. The wound is then closed in 3 layers in normal fashion. The wound is washed dried and a benzoin Steri-Strip this was applied the account sponge counts and instrument counts were correct at the end of the procedure .Marland Kitchen The patient tolerated the procedure without apparent  complication.  Maryagnes Amos.D.

## 2011-06-20 NOTE — Progress Notes (Signed)
Interesting that at device implant  Atrial activity noted at 40 with ventricular rate at 80  1:1 conduction though could not be achieved  It also did not look like wenckeback but there was clearly some association.  We will see  Probably hold off on Tikosyn for now

## 2011-06-20 NOTE — Progress Notes (Signed)
Pt back in room s/p PPM to left chest wall; no c/o pain; site without bleeding; wife at bedside; will cont. To monitor.

## 2011-06-20 NOTE — Progress Notes (Signed)
transport here to take pt for PPM; RN to accompany pt; wife to wait in room.

## 2011-06-20 NOTE — Progress Notes (Signed)
Orthopedic Tech Progress Note Patient Details:  Edward [name] [birth year] [id number]  Ortho Devices Type of Ortho Device: Arm foam sling Ortho Device/Splint Interventions: Application   Cammer, Theodoro Parma 06/20/2011, 2:01 PM

## 2011-06-20 NOTE — Progress Notes (Signed)
Pt. Rung asystole on the monitor, checked on patient and pt was fine. MD notified and was told to change patches, and leads around, and change telemetry box. Leads and patches were changed, unable to change telemetry box because they are assigned to specific rooms. Will continue to monitor.

## 2011-06-20 NOTE — Progress Notes (Signed)
K 3.3 this AM; PA here to see pt at this time.

## 2011-06-20 NOTE — Progress Notes (Signed)
Pt ambulated 350 feet in hallway with RN without any difficulty; will cont. To monitor.

## 2011-06-20 NOTE — Progress Notes (Addendum)
6 Days Post-Op Procedure(s) (LRB): CORONARY ARTERY BYPASS GRAFTING (CABG) (N/A) MAZE (N/A)  Subjective: Patient awaiting EPS decision for PPM or not. He has no complaints.  Objective: Vital signs in last 24 hours: Patient Vitals for the past 24 hrs:  BP Temp Temp src Pulse Resp SpO2 Weight  06/20/11 0419 152/77 mmHg 98.6 F (37 C) Oral 74  18  97 % 240 lb 11.2 oz (109.181 kg)  06/19/11 1954 122/69 mmHg 97 F (36.1 C) Oral 65  20  99 % -  06/19/11 1419 121/53 mmHg 97.3 F (36.3 C) Oral 62  18  96 % -   Pre op weight  108 kg Current Weight  06/20/11 240 lb 11.2 oz (109.181 kg)      Intake/Output from previous day: 06/04 0701 - 06/05 0700 In: 483 [P.O.:480; I.V.:3] Out: 5800 [Urine:5800]   Physical Exam:  Cardiovascular: RRR;no murmurs, gallops, or rubs. Pulmonary: Clear to auscultation bilaterally; no rales, wheezes, or rhonchi. Abdomen: Soft, non tender, bowel sounds present. Extremities: Mild bilateral lower extremity edema. Wounds: Clean and dry.  No erythema or signs of infection.  Lab Results: CBC:No results found for this basename: VOJ:5,KKX:3,GHW:2,XHB:7 in the last 72 hours BMET:   [facility] 06/20/11 0525 06/19/11 0615  NA 130* 131*  K 3.3* 3.3*  CL 90* 92*  CO2 30 27  GLUCOSE 111* 106*  BUN 25* 27*  CREATININE 1.19 1.16  CALCIUM 8.9 8.7    PT/INR: No results found for this basename: LABPROT,INR in the last 72 hours ABG:  INR: Will add last result for INR, ABG once components are confirmed Will add last 4 CBG results once components are confirmed  Assessment/Plan:  1. CV - Previously junctional bradycardia, AV paced,  and possible brief afib this am. HR improved and is now in the 70-80's.External pacer is set on back up at 50.  Blood pressure is elevated.Will continue Norvasc and Cozaar.Appreciate EPS assistance. 2.  Pulmonary - Encourage incentive spirometer. 3. Volume Overload - Continue with diuresis (Lasix 40 daily) 4.  Acute blood loss anemia -  Last H/H 10 and 32.4. 5.DM-HGA1C 6.6.CBGs 131/163/114.Continue metformin and continue with insulin PRN. 6.Supplement potassium. 7.Mild hyponatremia-likely secondary to diuresis.   ZIMMERMAN,DONIELLE MPA-C 06/20/2011    Chart reviewed, patient examined, agree with above. He appears to be in atrial fib this am.  I can't A-pace. Ventricular rate in 80's.  It sounds like pacer is on the schedule today.

## 2011-06-20 NOTE — H&P (View-Only) (Signed)
Electrophysiology Rounding Note  Patient Name: Edward [name]      SUBJECTIVE: s/p CABG and MAZE with sinus node arrest with junctional rhyhtm and remeergence of atrial fibrilation over the last few hours Normal LV function 5/12    PHYSICAL EXAM Blood pressure 152/77, pulse 74, temperature 98.6 F (37 C), temperature source Oral, resp. rate 18, height 6' (1.829 m), weight 240 lb 11.2 oz (109.181 kg), SpO2 97.00%. General appearance: alert and cooperative Lungs: clear to auscultation bilaterally Heart: irregularly irregular rhythm Extremities: extremities normal, atraumatic, no cyanosis or edema Pulses: 2+ and symmetric Skin: Skin color, texture, turgor normal. No rashes or lesions     TELEMETRY: Reviewed telemetry pt in  afib:    Intake/Output Summary (Last 24 hours) at 06/20/11 0946 Last data filed at 06/20/11 0735  Gross per 24 hour  Intake    363 ml  Output   5600 ml  Net  -5237 ml    LABS: Basic Metabolic Panel:  Lab 35/36/14 0525 06/19/11 0615 06/18/11 0435 06/17/11 0525 06/16/11 0452 06/15/11 1739 06/15/11 1700 06/15/11 0406  NA 130* 131* 129* 133* 132* 134* -- 138  K 3.3* 3.3* 4.0 3.9 4.8 4.9 -- 4.7  CL 90* 92* 94* 97 98 105 -- 106  CO2 30 27 22 26 24  -- -- 24  GLUCOSE 111* 106* 140* 112* 140* 162* -- 105*  BUN 25* 27* 36* 37* 28* 24* -- 19  CREATININE 1.19 1.16 1.20 1.54* 1.37* 1.80* 1.53* --  CALCIUM 8.9 8.7 -- -- -- -- -- --  MG -- -- -- -- -- -- 2.7* 3.0*  PHOS -- -- -- -- -- -- -- --    Liver Function Tests: No results found for this basename: AST:2,ALT:2,ALKPHOS:2,BILITOT:2,PROT:2,ALBUMIN:2 in the last 72 hours No results found for this basename: LIPASE:2,AMYLASE:2 in the last 72 hours CBC:  Lab 06/17/11 0525 06/16/11 0452 06/15/11 1739 06/15/11 1700 06/15/11 0406 06/14/11 2238 06/14/11 2232 06/14/11 1630 06/14/11 1233  WBC 14.5* 20.2* -- 22.9* 26.2* -- 21.7* 25.5* --  NEUTROABS -- -- -- -- -- -- -- -- --  HGB 10.0* 10.4* 12.9* 11.6* 12.3* 13.9  12.5* -- --  HCT 32.4* 34.2* 38.0* 37.0* 39.2 41.0 39.2 -- --  MCV 72.2* 72.5* -- 72.3* 71.4* -- 71.4* 71.5* --  PLT 159 170 -- 180 202 -- 203 201 182   Radiology/Studies:  Dg Chest 2 View  06/12/2011  *RADIOLOGY REPORT*  Clinical Data: Preop cardiac surgery.  CHEST - 2 VIEW  Comparison: Chest radiograph 07/29/2010  Findings: Normal cardiac silhouette.  Ectatic aorta.  No effusion, infiltrate, or pneumothorax.  IMPRESSION: No acute cardiopulmonary process.  Original Report Authenticated By: Suzy Bouchard, M.D.   Dg Chest Portable 1 View In Am  06/16/2011  *RADIOLOGY REPORT*  Clinical Data: Status post cardiac surgery.  PORTABLE CHEST - 1 VIEW  Comparison: Plain film chest 06/15/2011.  Findings: Swan-Ganz catheter and left chest tube have been removed. Left IJ sheath remains in place.  There is no pneumothorax.  The patient has a small left pleural effusion and mild left basilar atelectasis.  Lungs otherwise clear.  There is cardiomegaly.  IMPRESSION:  1.  Status post Swan-Ganz catheter and left chest tube removal.  No pneumothorax. 2.  No change in mild left basilar atelectasis and a very small effusion.  Original Report Authenticated By: Arvid Right. D'ALESSIO, M.D.   Dg Chest Portable 1 View In Am  06/15/2011  *RADIOLOGY REPORT*  Clinical Data: Postop open heart surgery.  PORTABLE CHEST - 1 VIEW  Comparison: [phone] and [phone]  Findings: There are changes of median sternotomy.  Left IJ central venous catheter transmits a Swan-Ganz catheter which is in the main pulmonary outflow tract.  Two left-sided chest tubse and mediastinal drains are stable. The endotracheal and nasogastric tubes have been removed.  Stable cardiomegaly and mediastinal contours.  Pulmonary vascularity is within normal limits.  Lung volumes are low, there is persistent left basilar opacity, favored to be atelectasis. Negative for edema.  Cannot exclude small left pleural effusion. Negative for pneumothorax.  IMPRESSION:  1.  No  significant change in left basilar atelectasis. 2.  Probable small left pleural effusion. 3.  Satisfactory position of support devices.  Original Report Authenticated By: Curlene Dolphin, M.D.   Dg Chest Portable 1 View  06/14/2011  *RADIOLOGY REPORT*  Clinical Data: Coronary artery disease.  Status post CABG.  PORTABLE CHEST - 1 VIEW  Comparison: 06/12/2011  Findings: Endotracheal tube, Swan-Ganz catheter, mediastinal and left chest tubes and NG tube all appear in good position.  No visible pneumothorax.  Pulmonary vascularity is normal.  Minimal atelectasis at the left lung base. Clip on the left atrial appendage.  IMPRESSION: Minimal atelectasis at the left lung base.  Tubes and lines in good position.  Original Report Authenticated By: Larey Seat, M.D.       ASSESSMENT AND PLAN:  Patient [facility] Problem List: Atrial fibrillation (04/18/2010)   Sick sinus syndrome (06/19/2011)   Pt with sinus node dysfunction and long standing atrial fibrillation will need pacing  We have also reviewed alternative treatments for atrial fib, incl AADx specifically dofetilide and amiodarone.  Given age, would favor former, however will need toaddress hypokalemia first  He is infavor, and we reviewed side effects incl proarrrhythmia risk.    The benefits and risks were reviewed including but not limited to death,  perforation, infection, lead dislodgement and device malfunction.  The patient understands agrees and is willing to proceed.    Signed, Virl Axe MD  06/20/2011

## 2011-06-20 NOTE — Progress Notes (Signed)
Electrophysiology Rounding Note  Patient Name: Edward [name]      SUBJECTIVE: s/p CABG and MAZE with sinus node arrest with junctional rhyhtm and remeergence of atrial fibrilation over the last few hours Normal LV function 5/12    PHYSICAL EXAM Blood pressure 152/77, pulse 74, temperature 98.6 F (37 C), temperature source Oral, resp. rate 18, height 6' (1.829 m), weight 240 lb 11.2 oz (109.181 kg), SpO2 97.00%. General appearance: alert and cooperative Lungs: clear to auscultation bilaterally Heart: irregularly irregular rhythm Extremities: extremities normal, atraumatic, no cyanosis or edema Pulses: 2+ and symmetric Skin: Skin color, texture, turgor normal. No rashes or lesions     TELEMETRY: Reviewed telemetry pt in  afib:    Intake/Output Summary (Last 24 hours) at 06/20/11 0946 Last data filed at 06/20/11 0735  Gross per 24 hour  Intake    363 ml  Output   5600 ml  Net  -5237 ml    LABS: Basic Metabolic Panel:  Lab 97/67/34 0525 06/19/11 0615 06/18/11 0435 06/17/11 0525 06/16/11 0452 06/15/11 1739 06/15/11 1700 06/15/11 0406  NA 130* 131* 129* 133* 132* 134* -- 138  K 3.3* 3.3* 4.0 3.9 4.8 4.9 -- 4.7  CL 90* 92* 94* 97 98 105 -- 106  CO2 30 27 22 26 24  -- -- 24  GLUCOSE 111* 106* 140* 112* 140* 162* -- 105*  BUN 25* 27* 36* 37* 28* 24* -- 19  CREATININE 1.19 1.16 1.20 1.54* 1.37* 1.80* 1.53* --  CALCIUM 8.9 8.7 -- -- -- -- -- --  MG -- -- -- -- -- -- 2.7* 3.0*  PHOS -- -- -- -- -- -- -- --    Liver Function Tests: No results found for this basename: AST:2,ALT:2,ALKPHOS:2,BILITOT:2,PROT:2,ALBUMIN:2 in the last 72 hours No results found for this basename: LIPASE:2,AMYLASE:2 in the last 72 hours CBC:  Lab 06/17/11 0525 06/16/11 0452 06/15/11 1739 06/15/11 1700 06/15/11 0406 06/14/11 2238 06/14/11 2232 06/14/11 1630 06/14/11 1233  WBC 14.5* 20.2* -- 22.9* 26.2* -- 21.7* 25.5* --  NEUTROABS -- -- -- -- -- -- -- -- --  HGB 10.0* 10.4* 12.9* 11.6* 12.3* 13.9  12.5* -- --  HCT 32.4* 34.2* 38.0* 37.0* 39.2 41.0 39.2 -- --  MCV 72.2* 72.5* -- 72.3* 71.4* -- 71.4* 71.5* --  PLT 159 170 -- 180 202 -- 203 201 182   Radiology/Studies:  Dg Chest 2 View  06/12/2011  *RADIOLOGY REPORT*  Clinical Data: Preop cardiac surgery.  CHEST - 2 VIEW  Comparison: Chest radiograph 07/29/2010  Findings: Normal cardiac silhouette.  Ectatic aorta.  No effusion, infiltrate, or pneumothorax.  IMPRESSION: No acute cardiopulmonary process.  Original Report Authenticated By: Suzy Bouchard, M.D.   Dg Chest Portable 1 View In Am  06/16/2011  *RADIOLOGY REPORT*  Clinical Data: Status post cardiac surgery.  PORTABLE CHEST - 1 VIEW  Comparison: Plain film chest 06/15/2011.  Findings: Swan-Ganz catheter and left chest tube have been removed. Left IJ sheath remains in place.  There is no pneumothorax.  The patient has a small left pleural effusion and mild left basilar atelectasis.  Lungs otherwise clear.  There is cardiomegaly.  IMPRESSION:  1.  Status post Swan-Ganz catheter and left chest tube removal.  No pneumothorax. 2.  No change in mild left basilar atelectasis and a very small effusion.  Original Report Authenticated By: Arvid Right. D'ALESSIO, M.D.   Dg Chest Portable 1 View In Am  06/15/2011  *RADIOLOGY REPORT*  Clinical Data: Postop open heart surgery.  PORTABLE CHEST - 1 VIEW  Comparison: [phone] and [phone]  Findings: There are changes of median sternotomy.  Left IJ central venous catheter transmits a Swan-Ganz catheter which is in the main pulmonary outflow tract.  Two left-sided chest tubse and mediastinal drains are stable. The endotracheal and nasogastric tubes have been removed.  Stable cardiomegaly and mediastinal contours.  Pulmonary vascularity is within normal limits.  Lung volumes are low, there is persistent left basilar opacity, favored to be atelectasis. Negative for edema.  Cannot exclude small left pleural effusion. Negative for pneumothorax.  IMPRESSION:  1.  No  significant change in left basilar atelectasis. 2.  Probable small left pleural effusion. 3.  Satisfactory position of support devices.  Original Report Authenticated By: Curlene Dolphin, M.D.   Dg Chest Portable 1 View  06/14/2011  *RADIOLOGY REPORT*  Clinical Data: Coronary artery disease.  Status post CABG.  PORTABLE CHEST - 1 VIEW  Comparison: 06/12/2011  Findings: Endotracheal tube, Swan-Ganz catheter, mediastinal and left chest tubes and NG tube all appear in good position.  No visible pneumothorax.  Pulmonary vascularity is normal.  Minimal atelectasis at the left lung base. Clip on the left atrial appendage.  IMPRESSION: Minimal atelectasis at the left lung base.  Tubes and lines in good position.  Original Report Authenticated By: Larey Seat, M.D.       ASSESSMENT AND PLAN:  Patient [facility] Problem List: Atrial fibrillation (04/18/2010)   Sick sinus syndrome (06/19/2011)   Pt with sinus node dysfunction and long standing atrial fibrillation will need pacing  We have also reviewed alternative treatments for atrial fib, incl AADx specifically dofetilide and amiodarone.  Given age, would favor former, however will need toaddress hypokalemia first  He is infavor, and we reviewed side effects incl proarrrhythmia risk.    The benefits and risks were reviewed including but not limited to death,  perforation, infection, lead dislodgement and device malfunction.  The patient understands agrees and is willing to proceed.    Signed, Virl Axe MD  06/20/2011

## 2011-06-20 NOTE — Interval H&P Note (Signed)
History and Physical Interval Note:  06/20/2011 9:52 AM  Edward [name]  has presented today for surgery, with the diagnosis of bradycardia  The various methods of treatment have been discussed with the patient and family. After consideration of risks, benefits and other options for treatment, the patient has consented to  Procedure(s) (LRB): PERMANENT PACEMAKER INSERTION (N/A) as a surgical intervention .  The patients' history has been reviewed, patient examined, no change in status, stable for surgery.  I have reviewed the patients' chart and labs.  Questions were answered to the patient's satisfaction.     Virl Axe

## 2011-06-21 ENCOUNTER — Encounter: Payer: Self-pay | Admitting: Surgery

## 2011-06-21 ENCOUNTER — Inpatient Hospital Stay (HOSPITAL_COMMUNITY): Payer: Medicare Other

## 2011-06-21 ENCOUNTER — Encounter: Payer: Self-pay | Admitting: *Deleted

## 2011-06-21 DIAGNOSIS — Z8679 Personal history of other diseases of the circulatory system: Secondary | ICD-10-CM

## 2011-06-21 DIAGNOSIS — I2510 Atherosclerotic heart disease of native coronary artery without angina pectoris: Secondary | ICD-10-CM

## 2011-06-21 DIAGNOSIS — Z950 Presence of cardiac pacemaker: Secondary | ICD-10-CM | POA: Insufficient documentation

## 2011-06-21 DIAGNOSIS — I498 Other specified cardiac arrhythmias: Secondary | ICD-10-CM

## 2011-06-21 LAB — GLUCOSE, CAPILLARY
Glucose-Capillary: 125 mg/dL — ABNORMAL HIGH (ref 70–99)
Glucose-Capillary: 121 mg/dL — ABNORMAL HIGH (ref 70–99)
Glucose-Capillary: 179 mg/dL — ABNORMAL HIGH (ref 70–99)

## 2011-06-21 LAB — BASIC METABOLIC PANEL
BUN: 18 mg/dL (ref 6–23)
Chloride: 90 mEq/L — ABNORMAL LOW (ref 96–112)
GFR calc Af Amer: 72 mL/min — ABNORMAL LOW (ref >90)
Potassium: 3.6 mEq/L (ref 3.5–5.1)

## 2011-06-21 MED ORDER — METFORMIN HCL 500 MG PO TABS
1000.0000 mg | ORAL_TABLET | Freq: Two times a day (BID) | ORAL | Status: DC
  Administered 2011-06-21 – 2011-06-22 (×2): 1000 mg via ORAL
  Filled 2011-06-21 (×4): qty 2

## 2011-06-21 NOTE — Progress Notes (Signed)
Pt had ambulated twice in hall with wife on room air, approx 550 feet. Tolerated very well.

## 2011-06-21 NOTE — Progress Notes (Signed)
Patient Name: Edward [name]      SUBJECTIVE: without complaint unusual rhythm  Discussed below  Past Medical History  Diagnosis Date  . A-fib   . HTN (hypertension)   . Diabetes mellitus   . Dyslipidemia   . PVD (peripheral vascular disease)     s/p Right CEA March, 2011,   . Claudication   . Hyperlipidemia   . GERD (gastroesophageal reflux disease)   . Diabetic neuropathy     PHYSICAL EXAM Filed Vitals:   06/20/11 1700 06/20/11 1815 06/20/11 2042 06/21/11 0511  BP: 142/81 144/85 137/75 152/92  Pulse: 96 82 81 82  Temp:   98.1 F (36.7 C) 97.6 F (36.4 C)  TempSrc:   Oral Oral  Resp: 18 18 18 16   Height:      Weight:    233 lb 8 oz (105.915 kg)  SpO2:   97% 96%    Well developed and nourished in no acute distress HENT normal Neck supple with JVP-flat Clear Pocket without swelling Regular rate and rhythm, no murmurs or gallops Abd-soft with active BS No Clubbing cyanosis edema Skin-warm and dry A & Oriented  Grossly normal sensory and motor function   TELEMETRY: Reviewed telemetry pt in av pacing mostly!:    Intake/Output Summary (Last 24 hours) at 06/21/11 0749 Last data filed at 06/21/11 0520  Gross per 24 hour  Intake      0 ml  Output   2600 ml  Net  -2600 ml    LABS: Basic Metabolic Panel:  Lab 69/45/03 0530 06/20/11 1533 06/20/11 0525 06/19/11 0615 06/18/11 0435 06/17/11 0525 06/16/11 0452 06/15/11 1700  NA 131* 130* 130* 131* 129* 133* 132* --  K 3.6 3.8 3.3* 3.3* 4.0 3.9 4.8 --  CL 90* 90* 90* 92* 94* 97 98 --  CO2 28 29 30 27 22 26 24  --  GLUCOSE 117* 178* 111* 106* 140* 112* 140* --  BUN 18 21 25* 27* 36* 37* 28* --  CREATININE 1.17 1.14 1.19 1.16 1.20 1.54* 1.37* --  CALCIUM 9.3 9.1 -- -- -- -- -- --  MG -- 1.4* -- -- -- -- -- 2.7*  PHOS -- -- -- -- -- -- -- --   Cardiac Enzymes: No results found for this basename: CKTOTAL:3,CKMB:3,CKMBINDEX:3,TROPONINI:3 in the last 72 hours CBC:  Lab 06/17/11 0525 06/16/11 0452 06/15/11  1739 06/15/11 1700 06/15/11 0406 06/14/11 2238 06/14/11 2232 06/14/11 1630 06/14/11 1233  WBC 14.5* 20.2* -- 22.9* 26.2* -- 21.7* 25.5* --  NEUTROABS -- -- -- -- -- -- -- -- --  HGB 10.0* 10.4* 12.9* 11.6* 12.3* 13.9 12.5* -- --  HCT 32.4* 34.2* 38.0* 37.0* 39.2 41.0 39.2 -- --  MCV 72.2* 72.5* -- 72.3* 71.4* -- 71.4* 71.5* --  PLT 159 170 -- 180 202 -- 203 201 182   P Device Interrogation: normal device function    ASSESSMENT AND PLAN:  [facility]: Atrial fibrillation (04/18/2010)   Sick sinus syndrome (06/19/2011)   Junctional rhythm  The pts intrnisic rhythm is sinus brady at a rate of about 30-40 with antegrade heart block//accelerated junctional rhythm at a rate of about 70-80   We can NOT overdrive this, reflecting 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  Mostly it resolves over a few weeks  We should watch.. In the past dilantin was used for this, and  more recently we have been successful with amio, but I would let time and symptoms dictate what and when we should do somethng  For now just follow Signed, Virl Axe MD  06/21/2011

## 2011-06-21 NOTE — Progress Notes (Signed)
CARDIAC REHAB PHASE I   PRE:  Rate/Rhythm: 80 POD  BP:  Supine:   Sitting: 132/78  Standing:    SaO2: 99 RA  MODE:  Ambulation: 700 ft   POST:  Rate/Rhythem: 92 Pacing  BP:  Supine:   Sitting: 136/78  Standing:    SaO2: 99 RA 1125-1210 Tolerated ambulation well with hand held assist. Gait steady. VS stable. No c/o with walking. Discussed Outpt. CRP with pt and wife. Pt agrees to referral to [facility] program.  Deon Pilling

## 2011-06-21 NOTE — Progress Notes (Signed)
Obtained order to removed EPW and CT sutures today. IRN is WNL. MD is aware of cardiac rhythm over past few days. Removed EPW with no resistance, no bleeding noted. Pt tolerated well. Also removed 3 chest tube sutures. No bleeding noted. Placed steri strips and benzoin. Pt on bed rest for one hour with frequent vitals going. Will continue to monitor.

## 2011-06-21 NOTE — Discharge Summary (Signed)
Physician Discharge Summary  Patient ID: Edward [name] MRN: [id number] DOB/AGE: 23-May-1940 71 y.o.  Admit date: 06/14/2011 Discharge date: 06/21/2011  Admission Diagnoses:  Patient Active Problem List  Diagnoses  . CAROTID ARTERY DISEASE  . PAIN IN SOFT TISSUES OF LIMB  . Atrial fibrillation  . Fatigue  . Diabetes mellitus  . Atherosclerosis of native arteries of the extremities with intermittent claudication  . Anemia  . Atherosclerosis of renal artery  . Hyperlipidemia  . LBBB (left bundle branch block)  . HTN (hypertension)    Discharge Diagnoses:   Patient Active Problem List  Diagnoses  . CAROTID ARTERY DISEASE  . PAIN IN SOFT TISSUES OF LIMB  . Atrial fibrillation  . Fatigue  . Diabetes mellitus  . Atherosclerosis of native arteries of the extremities with intermittent claudication  . Anemia  . Atherosclerosis of renal artery  . Hyperlipidemia  . LBBB (left bundle branch block)  . HTN (hypertension)  . Sick sinus syndrome  . Pacemaker-Medtronic  . Accelerated junctional rhythm  . S/P CABG x 6    Discharged Condition: good  History of Present Illness:   Edward [name] is a 71 yo white male with known history of recurrent Atrial Fibrillation S/P multiple cardioversion's.  The patient is not treated with anticoagulation due to history of GI bleeding.  He presented to his cardiologist Edward [name] with a several month complaint exertional dyspnea and generalized fatigue.  EKG obtained revealed the patient to again be suffering from Atrial Fibrillation.  The patient underwent a Myoview study which revealed a mildly reduced LV function, but was felt to be low risk for ischemia.  However, due to his current symptoms it was felt the patient would benefit from further evaluation with a cardiac catheterization.  This was performed on 06/05/2011 and showed a reduced EF with multivessel CAD.  Due to the findings the patient was referred to [facility] for further possible coronary bypass  procedure.  The patient was evaluated by Edward [name] on 06/06/2011 at which time he felt the patient would benefit from coronary bypass grafting as well as a right and left sided MAZE procedure.  The risks and benefits of the procedures were explained to the patient and he was agreeable to proceed.  His surgery was tentatively set up for 06/14/2011.    Hospital Course:   Edward [name] presented to [facility] on [facility]14/2011.  He was taken to the operating room and underwent CABG x6 utilizing LIMA to LAD, SVG to Diagonal 1, SVG to Diagonal 2, SVG to LCX, and sequential SVG to PD and PLVB.  The patient also underwent complete right and left sided MAZE procedure, Ligation of Left Atrial Appendage, and Endoscopic vein harvest of right leg.  The patient tolerated the procedure well and he was taken to the SICU in stable condition.  POD #1 the patient was extubated.  The patient was weaned off his neo synephrine as tolerated.  His dopamine was weaned down to 3.  The patients heart rate was junctional bradycardia and remained AV Paced.  His chest tubes and lines were removed without difficulty.  POD #2 the patients chest xray did not show any evidence of pneumothorax.  His dopamine was weaned down to 2.  POD #3 the patients heart rate continued to be junctional, bradycardic with rate in the 30's.  He remained AV Paced.  POD #4 the patient continued to have issues with bradycardia. He was medically stable otherwise and was transferred to the step  down unit.  POD #5 EP was consulted for possible pacemaker placement, due to persistent junctional bradycardia.  He was evaluated by Edward [name] who felt the patient to be suffering sick sinus syndrome.  It was felt to monitor the patient's rhythm over the next 24 hours, however it was felt he would most likely require pacemaker placement.  POD #6  The patient developed A. Fibrillation.  He was unable to be paced with a ventricular rate in the 80s.  EP again evaluated the  patient and felt that he would require pacemaker placement.  The patient was taken to the cath lab and underwent placement of a dual chamber pacemaker, utilizing a [employer]. Jude Passive 1948 ventricular lead serial number [id number] and an .Medtronic 5076 atrial lead serial number [id number].  The patient tolerated the procedure well.  POD #7 the patients pacemaker appears to be functioning.  However, the patient continues to have sinus bradycardia with rates in the 30-40s, which the pacemaker can not override.  Otherwise the patient is doing well.  Should he continue to improve and no further rhythm irregularities develop, he should be ready for d/c home in the next 24-48 hours.  He will need to follow up with Edward [name] on 07/17/2011 with a chest xray prior to his appointment.  He will also need to follow up with Edward [name] in 2 weeks time.  He will also follow up with EP as they feel appropriate.    Discharge Instructions:  -No driving for 4 weeks or while taking narcotic pain medications -No heavy lifting for 6 weeks (8lbs of less, about a gallon of milk) -Patient may shower, keep incisions clean and dry    Disposition: 01-Home or Self Care  Discharge Orders    Future Appointments: Provider: Department: Dept Phone: Center:   07/17/2011 12:30 PM Edward Pollack, MD [facility] [phone] [facility]   02/13/2012 2:00 PM [facility] Edward [name] [phone] [facility]   02/13/2012 3:00 PM Edward Mould, MD [facility] [phone] [facility]     Future Orders Please Complete By Expires   Amb Referral to Cardiac Rehabilitation        Medication List  As of 06/21/2011 12:39 PM   STOP taking these medications         carvedilol 25 MG tablet      flecainide 100 MG tablet      NITROSTAT 0.4 MG SL tablet         TAKE these medications         amLODipine 5 MG tablet   Commonly known as: NORVASC   Take 5 mg by mouth daily.      aspirin 81 MG tablet   Take 81 mg by mouth daily.      BYDUREON 2  MG Susr   Generic drug: Exenatide   Inject 2 mg into the skin once a week.      CALCIUM + D PO   Take 1,200 mg by mouth daily.      CoQ10 200 MG Caps   Take 1 capsule by mouth daily.      furosemide 40 MG tablet   Commonly known as: LASIX   Take 40 mg by mouth daily.      l-methylfolate-B6-B12 3-35-2 MG Tabs   Commonly known as: METANX   Take 1 tablet by mouth daily.      loratadine 10 MG tablet   Commonly known as: CLARITIN   Take 10 mg by mouth daily.  losartan 50 MG tablet   Commonly known as: COZAAR   Take 1 tablet (50 mg total) by mouth daily.      Magnesium 500 MG Caps   Take 250 mg by mouth daily.      metFORMIN 1000 MG tablet   Commonly known as: GLUCOPHAGE   Take 1,000 mg by mouth 2 (two) times daily with a meal.      multivitamin tablet   Take 1 tablet by mouth daily.      omeprazole 20 MG tablet   Commonly known as: PRILOSEC OTC   Take 20 mg by mouth daily.      oxyCODONE 5 MG immediate release tablet   Commonly known as: Oxy IR/ROXICODONE   Take 1-2 tablets (5-10 mg total) by mouth every 4 (four) hours as needed for pain.      potassium chloride SA 20 MEQ tablet   Commonly known as: K-DUR,KLOR-CON   Take 20 mEq by mouth daily.      pravastatin 80 MG tablet   Commonly known as: PRAVACHOL   Take 80 mg by mouth daily.           Follow-up Information    Follow up with [name]., MD. (Call for a follow up appointment for 2 weeks)    Contact information:   [address], [address]       Follow up with Edward Pollack, MD. (PA/LAT CXR to be taken on 6/  /2013 at;Appointment with Edward [name]  is on 7/02 /2013 at 12:30 pm)    Contact information:   [address] [phone]          Signed: Ellwood Handler 06/21/2011, 12:39 PM

## 2011-06-21 NOTE — Progress Notes (Addendum)
[address]            [place] [address]          [phone]     1 Day Post-Op  Procedure(s) (LRB): PERMANENT PACEMAKER INSERTION (N/A) Subjective: Feels well overall  Objective  Telemetry paced, see discussion of rhythm per Dr Caryl Comes (EP)  Temp:  [97 F (36.1 C)-98.2 F (36.8 C)] 97.6 F (36.4 C) (06/06 0511) Pulse Rate:  [78-97] 82  (06/06 0511) Resp:  [16-18] 16  (06/06 0511) BP: (111-152)/(72-92) 152/92 mmHg (06/06 0511) SpO2:  [96 %-100 %] 96 % (06/06 0511) Weight:  [233 lb 8 oz (105.915 kg)] 233 lb 8 oz (105.915 kg) (06/06 0511)   Intake/Output Summary (Last 24 hours) at 06/21/11 0803 Last data filed at 06/21/11 0520  Gross per 24 hour  Intake      0 ml  Output   2600 ml  Net  -2600 ml       General appearance: alert, cooperative and no distress Heart: regular rate and rhythm Lungs: clear to auscultation bilaterally Abdomen: benign Extremities: + edema Wound: incisions healing well  Lab Results:  Basename 06/21/11 0530 06/20/11 1533  NA 131* 130*  K 3.6 3.8  CL 90* 90*  CO2 28 29  GLUCOSE 117* 178*  BUN 18 21  CREATININE 1.17 1.14  CALCIUM 9.3 9.1  MG -- 1.4*  PHOS -- --   No results found for this basename: AST:2,ALT:2,ALKPHOS:2,BILITOT:2,PROT:2,ALBUMIN:2 in the last 72 hours No results found for this basename: LIPASE:2,AMYLASE:2 in the last 72 hours No results found for this basename: WBC:2,NEUTROABS:2,HGB:2,HCT:2,MCV:2,PLT:2 in the last 72 hours No results found for this basename: CKTOTAL:4,CKMB:4,TROPONINI:4 in the last 72 hours No components found with this basename: POCBNP:3 No results found for this basename: DDIMER in the last 72 hours No results found for this basename: HGBA1C in the last 72 hours No results found for this basename: CHOL,HDL,LDLCALC,TRIG,CHOLHDL in the last 72 hours No results found for this basename: TSH,T4TOTAL,FREET3,T3FREE,THYROIDAB in the last 72 hours No results found for this  basename: VITAMINB12,FOLATE,FERRITIN,TIBC,IRON,RETICCTPCT in the last 72 hours  Medications: Scheduled    . ALPRAZolam  0.5 mg Oral BID  . amLODipine  5 mg Oral Daily  . aspirin EC  81 mg Oral Daily  .  ceFAZolin (ANCEF) IV  1 g Intravenous Q6H  .  ceFAZolin (ANCEF) IV  2 g Intravenous On Call  . docusate sodium  200 mg Oral Daily  . fentaNYL      . gentamicin irrigation  80 mg Irrigation On Call  . heparin      . heparin      . insulin aspart  0-24 Units Subcutaneous TID AC & HS  . lidocaine      . lidocaine      . losartan  50 mg Oral Daily  . metFORMIN  500 mg Oral BID WC  . midazolam      . pantoprazole  40 mg Oral QAC breakfast  . potassium chloride  30 mEq Oral Once  . potassium chloride  10 mEq Intravenous Once  . potassium chloride  40 mEq Oral Once  . pravastatin  80 mg Oral q1800  . sodium chloride  3 mL Intravenous Q12H  . DISCONTD: furosemide  40 mg Oral Q breakfast  . DISCONTD: potassium chloride  40 mEq Oral Daily     Radiology/Studies:  Dg Chest Portable 1 View  06/21/2011  *RADIOLOGY REPORT*  Clinical Data: Pacemaker insertion.  PORTABLE CHEST - 1 VIEW  Comparison: 06/16/2011.  Findings: Trachea is midline.  Heart size stable.  Pacemaker lead tips project over the right atrium and right ventricle.  Epicardial pacer wires remain in place.  No pneumothorax.  Lungs are clear. No pleural fluid.  IMPRESSION: Pacemaker placement without complicating feature.  Original Report Authenticated By: Luretha Rued, M.D.    INR: Will add last result for INR, ABG once components are confirmed Will add last 4 CBG results once components are confirmed  Assessment/Plan: S/P Procedure(s) (LRB): PERMANENT PACEMAKER INSERTION (N/A)  1 monitoring rhythm per EP 2 cont rehab 3 cbg 121-257 , increase metformin  LOS: 7 days    GOLD,WAYNE E 6/6/20138:03 AM     Chart reviewed, patient examined, agree with above. He is doing well after pacer.  He can go home tomorrow if  there are no changes. He will need EP followup.

## 2011-06-22 LAB — GLUCOSE, CAPILLARY: Glucose-Capillary: 112 mg/dL — ABNORMAL HIGH (ref 70–99)

## 2011-06-22 NOTE — Progress Notes (Signed)
[id number] Cardiac Rehab Completed discharge education with pt and wife. They voice understanding.

## 2011-06-22 NOTE — Progress Notes (Signed)
Patient: Edward [name] Date of Encounter: 06/22/2011, 9:12 AM Admit date: 06/14/2011     Subjective  Mr. Edward [name] is doing well and has no new complaints. He denies chest pain, shortness of breath, palpitations or dizziness. He is eager to go home.   Objective   Filed Vitals:   06/22/11 0500  BP: 128/87  Pulse: 92  Temp: 97 F (36.1 C)  Resp: 16     Intake/Output Summary (Last 24 hours) at 06/22/11 0912 Last data filed at 06/22/11 9242  Gross per 24 hour  Intake    720 ml  Output   2551 ml  Net  -1831 ml    Physical Exam: General: Well developed, well appearing 71 year old male in no acute distress. Head: Normocephalic, atraumatic, sclera non-icteric, no xanthomas, nares are without discharge.  Neck: Supple. JVD not elevated. Lungs: Clear bilaterally to auscultation without wheezes, rales, or rhonchi. Breathing is unlabored. No wheezes, rales or rhonchi. Heart: Occasionally irregular S1 S2 without murmurs, rubs, or gallops.  Abdomen: Soft, non-distended. Extremities: No clubbing or cyanosis. No edema.  Distal pedal pulses are 2+ and equal bilaterally. Neuro: Alert and oriented X 3. Moves all extremities spontaneously. No focal deficits. Psych:  Responds to questions appropriately with a normal affect.  Inpatient Medications:  . ALPRAZolam  0.5 mg Oral BID  . amLODipine  5 mg Oral Daily  . aspirin EC  81 mg Oral Daily  . docusate sodium  200 mg Oral Daily  . insulin aspart  0-24 Units Subcutaneous TID AC & HS  . losartan  50 mg Oral Daily  . metFORMIN  1,000 mg Oral BID WC  . pantoprazole  40 mg Oral QAC breakfast  . pravastatin  80 mg Oral q1800   Labs:  Basename 06/21/11 0530 06/20/11 1533  NA 131* 130*  K 3.6 3.8  CL 90* 90*  CO2 28 29  GLUCOSE 117* 178*  BUN 18 21  CREATININE 1.17 1.14  CALCIUM 9.3 9.1  MG -- 1.4*  PHOS -- --    Radiology/Studies: Dg Chest Portable 1 View  06/21/2011  *RADIOLOGY REPORT*  Clinical Data: Pacemaker insertion.   PORTABLE CHEST - 1 VIEW  Comparison: 06/16/2011.  Findings: Trachea is midline.  Heart size stable.  Pacemaker lead tips project over the right atrium and right ventricle.  Epicardial pacer wires remain in place.  No pneumothorax.  Lungs are clear. No pleural fluid.  IMPRESSION: Pacemaker placement without complicating feature.  Original Report Authenticated By: Luretha Rued, M.D.   Device interrogation this AM shows normal PPM function; underlying rhythm is sinus brady in the 30-40s with antegrade heart block/accelerated junctional rhythm at a rate of 75-85   Assessment and Plan  1. Junctional rhythm  2. Sick sinus syndrome s/p dual chamber PPM 3. Atrial fibrillation  Per Dr. Olin Pia recommendations/comments yesterday -  His intrinsic rhythm is sinus brady at a rate of about 30-40 with antegrade heart block/accelerated junctional rhythm. 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 This may be a conseuqence of ablation. We see this occasionally with RF ablation of the posterior septal space. Mostly it resolves over a few weeks and we should watch. In the past, dilantin was used for this and more recently we have been successful with amiodarone but I would let time and symptoms dictate what and when we should do somethng. For now just follow.   He will follow-up as an outpatient with Dr.  Caryl Comes in 3 weeks and the appointment has been made for him.   Signed, Amit Leece PA-C

## 2011-06-22 NOTE — Progress Notes (Addendum)
2 Days Post-Op Procedure(s) (LRB): PERMANENT PACEMAKER INSERTION;8 days post op CABG and Maze  Subjective: Patient without complaints and hopes to go home.  Objective: Vital signs in last 24 hours: Patient Vitals for the past 24 hrs:  BP Temp Temp src Pulse Resp SpO2 Weight  06/22/11 0500 128/87 mmHg 97 F (36.1 C) Oral 92  16  99 % 230 lb 6.4 oz (104.509 kg)  06/21/11 2010 127/61 mmHg 97.1 F (36.2 C) Oral 90  16  100 % -  06/21/11 1600 133/70 mmHg - - 88  - - -  06/21/11 1530 158/87 mmHg - - 86  - - -  06/21/11 1500 142/69 mmHg - - 84  - - -  06/21/11 1445 149/73 mmHg - - 88  - - -  06/21/11 1421 125/61 mmHg - - 82  17  100 % -  06/21/11 1300 114/65 mmHg 97.7 F (36.5 C) Oral 82  18  100 % -   Pre op weight  108 kg Current Weight  06/22/11 230 lb 6.4 oz (104.509 kg)      Intake/Output from previous day: 06/06 0701 - 06/07 0700 In: 720 [P.O.:720] Out: 2776 [Urine:2775; Stool:1]   Physical Exam:  Cardiovascular: AV paced;no murmurs, gallops, or rubs. Pulmonary: Clear to auscultation bilaterally; no rales, wheezes, or rhonchi. Abdomen: Soft, non tender, bowel sounds present. Extremities: Mild bilateral lower extremity edema. Wounds: Clean and dry.  No erythema or signs of infection.  Lab Results: CBC:No results found for this basename: TIW:5,YKD:9,IPJ:8,SNK:5 in the last 72 hours BMET:   [facility] 06/21/11 0530 06/20/11 1533  NA 131* 130*  K 3.6 3.8  CL 90* 90*  CO2 28 29  GLUCOSE 117* 178*  BUN 18 21  CREATININE 1.17 1.14  CALCIUM 9.3 9.1    PT/INR: No results found for this basename: LABPROT,INR in the last 72 hours ABG:  INR: Will add last result for INR, ABG once components are confirmed Will add last 4 CBG results once components are confirmed  Assessment/Plan:  1. CV - Previously junctional bradycardia, AV paced,  and brief afib. Per EPS, intrinsic rhythm is junctional with antegrade heart block and accelerated junctional rhythm.Will continue  Norvasc and Cozaar.Appreciate EPS assistance. 2.  Pulmonary - Encourage incentive spirometer. 3. Volume Overload - Continue with diuresis (Lasix 40 daily) 4.  Acute blood loss anemia - Last H/H 10 and 32.4. 5.DM-HGA1C 6.6.CBGs 125/143/112.Continue metformin and continue with insulin PRN. 6.Await EP evaluation and then discharge.  Lorilei Horan MPA-C 06/22/2011

## 2011-06-22 NOTE — Discharge Instructions (Signed)
Activity: 1.May walk up steps                2.No lifting more than ten pounds for four weeks.                 3.No driving for four weeks.                4.Stop any activity that causes chest pain, shortness of breath, dizziness, sweating or excessive weakness.                5.Avoid straining.                6.Continue with your breathing exercises daily.  Diet: Diabetic diet and Low fat, Low salt diet  Wound Care: No shower/bath, swimming pools or hot tubs for 1 week.  You may shower on 06/29/2011.  Clean wounds with mild soap and water. Contact the office at [phone] if any problems arise.      Coronary Artery Bypass Grafting Care After Refer to this sheet in the next few weeks. These instructions provide you with information on caring for yourself after your procedure. Your caregiver may also give you more specific instructions. Your treatment has been planned according to current medical practices, but problems sometimes occur. Call your caregiver if you have any problems or questions after your procedure.  Recovery from open heart surgery will be different for everyone. Some people feel well after 3 or 4 weeks, while for others it takes longer. After heart surgery, it may be normal to:  Not have an appetite, feel nauseated by the smell of food, or only want to eat a small amount.   Be constipated because of changes in your diet, activity, and medicines. Eat foods high in fiber. Add fresh fruits and vegetables to your diet. Stool softeners may be helpful.   Feel sad or unhappy. You may be frustrated or cranky. You may have good days and bad days. Do not give up. Talk to your caregiver if you do not feel better.   Feel weakness and fatigue. You many need physical therapy or cardiac rehabilitation to get your strength back.   Develop an irregular heartbeat called atrial fibrillation. Symptoms of atrial fibrillation are a fast, irregular heartbeat or feelings of fluttery heartbeats,  shortness of breath, low blood pressure, and dizziness. If these symptoms develop, see your caregiver right away.  MEDICATION  Have a list of all the medicines you will be taking when you leave the hospital. For every medicine, know the following:   Name.   Exact dose.   Time of day to be taken.   How often it should be taken.   Why you are taking it.   Ask which medicines should or should not be taken together. If you take more than one heart medicine, ask if it is okay to take them together. Some heart medicines should not be taken at the same time because they may lower your blood pressure too much.   Narcotic pain medicine can cause constipation. Eat fresh fruits and vegetables. Add fiber to your diet. Stool softener medicine may help relieve constipation.   Keep a copy of your medicines with you at all times.   Do not add or stop taking any medicine until you check with your caregiver.   Medicines can have side effects. Call your caregiver who prescribed the medicine if you:   Start throwing up, have diarrhea, or have stomach pain.   Feel  dizzy or lightheaded when you stand up.   Feel your heart is skipping beats or is beating too fast or too slow.   Develop a rash.   Notice unusual bruising or bleeding.  HOME CARE INSTRUCTIONS  After heart surgery, it is important to learn how to take your pulse. Have your caregiver show you how to take your pulse.   Use your incentive spirometer. Ask your caregiver how long after surgery you need to use it.  Care of your chest incision  Tell your caregiver right away if you notice clicking in your chest (sternum).   Support your chest with a pillow or your arms when you take deep breaths and cough.   Follow your caregiver's instructions about when you can bathe or swim.   Protect your incision from sunlight during the first year to keep the scar from getting dark.   Tell your caregiver if you notice:   Increased tenderness of  your incision.   Increased redness or swelling around your incision.   Drainage or pus from your incision.  Care of your leg incision(s)  Avoid crossing your legs.   Avoid sitting for long periods of time. Change positions every half hour.   Elevate your leg(s) when you are sitting.   Check your leg(s) daily for swelling. Check the incisions for redness or drainage.   Wear your elastic stockings as told by your caregiver. Take them off at bedtime.  Diet  Diet is very important to heart health.   Eat plenty of fresh fruits and vegetables. Meats should be lean cut. Avoid canned, processed, and fried foods.   Talk to a dietician. They can teach you how to make healthy food and drink choices.  Weight  Weigh yourself every day. This is important because it helps to know if you are retaining fluid that may make your heart and lungs work harder.   Use the same scale each time.   Weigh yourself every morning at the same time. You should do this after you go to the bathroom, but before you eat breakfast.   Your weight will be more accurate if you do not wear any clothes.   Record your weight.   Tell your caregiver if you have gained 2 pounds or more overnight.  Activity Stop any activity at once if you have chest pain, shortness of breath, irregular heartbeats, or dizziness. Get help right away if you have any of these symptoms.  Bathing.  Avoid soaking in a bath or hot tub until your incisions are healed.   Rest. You need a balance of rest and activity.   Exercise. Exercise per your caregiver's advice. You may need physical therapy or cardiac rehabilitation to help strengthen your muscles and build your endurance.   Climbing stairs. Unless your caregiver tells you not to climb stairs, go up stairs slowly and rest if you tire. Do not pull yourself up by the handrail.   Driving a car. Follow your caregiver's advice on when you may drive. You may ride as a passenger at any time.  When traveling for long periods of time in a car, get out of the car and walk around for a few minutes every 2 hours.   Lifting. Avoid lifting, pushing, or pulling anything heavier than 10 pounds for 6 weeks after surgery or as told by your caregiver.   Returning to work. Check with your caregiver. People heal at different rates. Most people will be able to go back to  work 6 to 12 weeks after surgery.   Sexual activity. You may resume sexual relations as told by your caregiver.     Supplemental Discharge Instructions for  Pacemaker/Defibrillator Patients  Activity No heavy lifting or vigorous activity with your left/right arm for 6 to 8 weeks.  Do not raise your left/right arm above your head for one week.  Gradually raise your affected arm as drawn below.            06/09                      06/10                       06/11                      06/12       NO DRIVING until given clearance by CT surgery. WOUND CARE   Keep the wound area clean and dry.  Do not get this area wet for one week. No showers for one week; you may shower on 06/29/2011.   The tape/steri-strips on your wound will fall off; do not pull them off.  No bandage is needed on the site.  DO  NOT apply any creams, oils, or ointments to the wound area.   If you notice any drainage or discharge from the wound, any swelling or bruising at the site, or you develop a fever > 101? F after you are discharged home, call the office at once.  Special Instructions   You are still able to use cellular telephones; use the ear opposite the side where you have your pacemaker/defibrillator.  Avoid carrying your cellular phone near your device.   When traveling through airports, show security personnel your identification card to avoid being screened in the metal detectors.  Ask the security personnel to use the hand wand.   Avoid arc welding equipment, MRI testing (magnetic resonance imaging), TENS units (transcutaneous nerve  stimulators).  Call the office for questions about other devices.    SEEK MEDICAL CARE IF:  Any of your incisions are red, painful, or have any type of drainage coming from them.   You have an oral temperature above 102 F (38.9 C).   You have ankle or leg swelling.   You have pain in your legs.   You have weight gain of 2 or more pounds a day.   You feel dizzy or lightheaded when you stand up.  SEEK IMMEDIATE MEDICAL CARE IF:  You have angina or chest pain that goes to your jaw or arms. Call your local emergency services right away.   You have shortness of breath at rest or with activity.   You have a fast or irregular heartbeat (arrhythmia).   There is a "clicking" in your sternum when you move.   You have numbness or weakness in your arms or legs.  MAKE SURE YOU:  Understand these instructions.   Will watch your condition.   Will get help right away if you are not doing well or get worse.  Document Released: 07/21/2004 Document Revised: 12/21/2010 Document Reviewed: 03/08/2010 [facility] Patient Information 2012 [employer].

## 2011-06-22 NOTE — Progress Notes (Signed)
Discharge instructions given to pt and wife along with prescriptions. Also went over arm movement instructions with pt and wife after pacemaker placement. Pt and wife verbalized understanding. Ready for discharge.

## 2011-06-26 ENCOUNTER — Telehealth: Payer: Self-pay | Admitting: Cardiovascular Disease

## 2011-06-26 NOTE — Telephone Encounter (Signed)
App made for f/u, pt to see Caryl Comes then [place]. Pt to start rehab in 2 weeks

## 2011-06-26 NOTE — Telephone Encounter (Signed)
New msg Pt wants to talk to you about when he needs to see Dr Acie Fredrickson.

## 2011-06-27 ENCOUNTER — Telehealth: Payer: Self-pay | Admitting: *Deleted

## 2011-06-27 DIAGNOSIS — R059 Cough, unspecified: Secondary | ICD-10-CM

## 2011-06-27 DIAGNOSIS — R05 Cough: Secondary | ICD-10-CM

## 2011-06-27 NOTE — Telephone Encounter (Signed)
Mrs. Pinzon is concerned regarding her husband's dry cough, congestion that keeps him awake at night. There is no frank shortness of breath. He was not placed on any new meds on discharge.  I offered for him to get a cxr to be reviewed and then more advice could be offered then.  She agreed.  He will go to GI in the morning for the cxr with a call report.

## 2011-07-10 ENCOUNTER — Other Ambulatory Visit: Payer: Self-pay | Admitting: Surgery

## 2011-07-10 DIAGNOSIS — I2510 Atherosclerotic heart disease of native coronary artery without angina pectoris: Secondary | ICD-10-CM

## 2011-07-13 ENCOUNTER — Encounter: Payer: Self-pay | Admitting: Internal Medicine

## 2011-07-13 ENCOUNTER — Ambulatory Visit (INDEPENDENT_AMBULATORY_CARE_PROVIDER_SITE_OTHER): Payer: Federal, State, Local not specified - PPO | Admitting: Internal Medicine

## 2011-07-13 VITALS — BP 142/91 | HR 88 | Ht 70.0 in | Wt 233.0 lb

## 2011-07-13 DIAGNOSIS — I4891 Unspecified atrial fibrillation: Secondary | ICD-10-CM

## 2011-07-13 DIAGNOSIS — I498 Other specified cardiac arrhythmias: Secondary | ICD-10-CM

## 2011-07-13 DIAGNOSIS — Z950 Presence of cardiac pacemaker: Secondary | ICD-10-CM

## 2011-07-13 DIAGNOSIS — I495 Sick sinus syndrome: Secondary | ICD-10-CM

## 2011-07-13 DIAGNOSIS — I447 Left bundle-branch block, unspecified: Secondary | ICD-10-CM

## 2011-07-13 DIAGNOSIS — I10 Essential (primary) hypertension: Secondary | ICD-10-CM

## 2011-07-13 LAB — PACEMAKER DEVICE OBSERVATION
BATTERY VOLTAGE: 2.79 V
VENTRICULAR PACING PM: 77

## 2011-07-13 MED ORDER — METOPROLOL TARTRATE 50 MG PO TABS: 50.0000 mg | ORAL_TABLET | Freq: Two times a day (BID) | ORAL | Status: DC

## 2011-07-13 NOTE — Assessment & Plan Note (Signed)
Will increase his beta blocker to help this.

## 2011-07-13 NOTE — Progress Notes (Signed)
HPI  Edward [name] is a 71 y.o. male Seen following pacemaker implantation a couple of weeks ago for sinus node dysfunction and a rapid junctional rhythm occurring in the context of CABG and maze surgery.  His post operative issue however was accelerated junctional rhythm is Past Medical History  Diagnosis Date  . A-fib   . HTN (hypertension)   . Diabetes mellitus   . Dyslipidemia   . PVD (peripheral vascular disease)     s/p Right CEA March, 2011,   . Claudication   . Hyperlipidemia   . GERD (gastroesophageal reflux disease)   . Diabetic neuropathy     Past Surgical History  Procedure Date  . Carotid endarterectomy     RIGHT  . Cholecystectomy   . Tonsillectomy and adenoidectomy   . Knee surgery   . 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;  Service: Open Heart Surgery;  Laterality: N/A;  Coronary Artery Bypass Graft on pump times six;  utilizing internal mammary artery and right greater saphenous vein harvested endoscopically.  . Maze 06/14/2011    Procedure: MAZE;  Surgeon: Gaye Pollack, MD;  Location: [place];  Service: Open Heart Surgery;  Laterality: N/A;  [place] left atrial appendage    Current Outpatient Prescriptions  Medication Sig Dispense Refill  . amLODipine (NORVASC) 5 MG tablet Take 5 mg by mouth daily.      Marland Kitchen aspirin 81 MG tablet Take 81 mg by mouth daily.      . Calcium Carbonate-Vitamin D (CALCIUM + D PO) Take 1,200 mg by mouth daily.        . Coenzyme Q10 (COQ10) 200 MG CAPS Take 1 capsule by mouth daily.        . furosemide (LASIX) 40 MG tablet Take 40 mg by mouth daily.      Marland Kitchen l-methylfolate-B6-B12 (METANX) 3-35-2 MG TABS Take 1 tablet by mouth daily.      Marland Kitchen loratadine (CLARITIN) 10 MG tablet Take 10 mg by mouth daily.      Marland Kitchen losartan (COZAAR) 50 MG tablet Take 1 tablet (50 mg total) by mouth daily.  30 tablet  1  . Magnesium 500 MG CAPS Take 250 mg by  mouth daily.       . metFORMIN (GLUCOPHAGE) 1000 MG tablet Take 1,000 mg by mouth 2 (two) times daily with a meal.       . Multiple Vitamin (MULTIVITAMIN) tablet Take 1 tablet by mouth daily.        Marland Kitchen omeprazole (PRILOSEC OTC) 20 MG tablet Take 20 mg by mouth daily.       . pravastatin (PRAVACHOL) 80 MG tablet Take 80 mg by mouth daily.      Marland Kitchen amoxicillin (AMOXIL) 500 MG capsule       . carvedilol (COREG) 25 MG tablet       . Exenatide (BYDUREON) 2 MG SUSR Inject 2 mg into the skin once a week.      . flecainide (TAMBOCOR) 100 MG tablet       . isosorbide mononitrate (IMDUR) 30 MG 24 hr tablet       . NITROSTAT 0.4 MG SL tablet       . oxyCODONE (OXY IR/ROXICODONE) 5 MG immediate release tablet       . potassium chloride SA (K-DUR,KLOR-CON) 20 MEQ tablet Take 20 mEq by mouth daily.  Allergies  Allergen Reactions  . Fenofibrate Other (See Comments)    Reaction unknown  . Lipitor (Atorvastatin Calcium) Other (See Comments)    Reaction unknown    Review of Systems negative except from HPI and PMH  Physical Exam BP 142/91  Pulse 88  Ht 5' 10"  (1.778 m)  Wt 233 lb (105.688 kg)  BMI 33.43 kg/m2 Well developed and nourished in no acute distress HENT normal Neck supple with JVP-flat Clear Regular rate and rhythm, no murmurs or gallops Abd-soft with active BS No Clubbing cyanosis edema Skin-warm and dry A & Oriented  Grossly normal sensory and motor function   Electrocardiogram demonstrates junctional rhythm with intermittent atrial paced beats Skin Warm and Dry    Assessment and  Plan

## 2011-07-13 NOTE — Patient Instructions (Signed)
Your physician has recommended you make the following change in your medication:  1) Start metoprolol tartrate 50 mg one tablet by mouth twice daily.  Your physician recommends that you schedule a follow-up appointment in: 3 weeks with Dr. Caryl Comes.

## 2011-07-13 NOTE — Assessment & Plan Note (Signed)
Not currently evident on electrocardiogram

## 2011-07-13 NOTE — Assessment & Plan Note (Signed)
No intercurrent Atrial fibrillation or flutter

## 2011-07-13 NOTE — Assessment & Plan Note (Signed)
Continues with accelerated junctional rhythm at about 90 beats per minute. We will initially try a beta blocker regime. His blood pressure is elevated so this plenty of room. If this doesn't work we'll try amiodarone for an intermediate term.

## 2011-07-13 NOTE — Assessment & Plan Note (Signed)
.  sinus brady

## 2011-07-17 ENCOUNTER — Encounter: Payer: Self-pay | Admitting: Surgery

## 2011-07-17 ENCOUNTER — Ambulatory Visit
Admission: RE | Admit: 2011-07-17 | Discharge: 2011-07-17 | Disposition: A | Discharge status: Home / Self Care | Payer: Federal, State, Local not specified - PPO | Source: Ambulatory Visit | Attending: Surgery | Admitting: Surgery

## 2011-07-17 ENCOUNTER — Ambulatory Visit (INDEPENDENT_AMBULATORY_CARE_PROVIDER_SITE_OTHER): Payer: Self-pay | Admitting: Surgery

## 2011-07-17 VITALS — BP 137/85 | HR 90 | Resp 16 | Ht 70.0 in | Wt 230.0 lb

## 2011-07-17 DIAGNOSIS — I2510 Atherosclerotic heart disease of native coronary artery without angina pectoris: Secondary | ICD-10-CM

## 2011-07-17 DIAGNOSIS — Z09 Encounter for follow-up examination after completed treatment for conditions other than malignant neoplasm: Secondary | ICD-10-CM

## 2011-07-17 DIAGNOSIS — I4891 Unspecified atrial fibrillation: Secondary | ICD-10-CM

## 2011-07-17 NOTE — Progress Notes (Signed)
[address]            [place] [address]          [phone]     HPI:  Patient returns for routine postoperative follow-up having undergone coronary bypass graft surgery x6 and right and left-sided Maze procedure on 06/14/2011. The patient's early postoperative recovery while in the hospital was notable for sinus node dysfunction with junctional bradycardia and accelerated junctional rhythm. He was seen by electrophysiology and underwent insertion of a permanent dual-chamber pacemaker. Since hospital discharge the patient reports he is been feeling better every week. He is walking daily without chest pain or shortness of breath. He is planning on participating in cardiac rehabilitation. He was recently seen by Dr. Caryl Comes and noted to be in accelerated junctional rhythm at a rate of 90. His beta blocker was increased.   Current Outpatient Prescriptions  Medication Sig Dispense Refill  . amLODipine (NORVASC) 5 MG tablet Take 5 mg by mouth daily.      Marland Kitchen aspirin 81 MG tablet Take 81 mg by mouth daily.      . Calcium Carbonate-Vitamin D (CALCIUM + D PO) Take 1,200 mg by mouth daily.        . Coenzyme Q10 (COQ10) 200 MG CAPS Take 1 capsule by mouth daily.        . Exenatide (BYDUREON) 2 MG SUSR Inject into the skin once a week.      . furosemide (LASIX) 40 MG tablet Take 40 mg by mouth daily.      Marland Kitchen l-methylfolate-B6-B12 (METANX) 3-35-2 MG TABS Take 1 tablet by mouth daily.      Marland Kitchen loratadine (CLARITIN) 10 MG tablet Take 10 mg by mouth daily.      Marland Kitchen losartan (COZAAR) 50 MG tablet Take 1 tablet (50 mg total) by mouth daily.  30 tablet  1  . Magnesium 500 MG CAPS Take 250 mg by mouth daily.       . metFORMIN (GLUCOPHAGE) 1000 MG tablet Take 1,000 mg by mouth 2 (two) times daily with a meal.       . metoprolol (LOPRESSOR) 50 MG tablet Take 1 tablet (50 mg total) by mouth 2 (two) times daily.  60 tablet  6  . Multiple Vitamin (MULTIVITAMIN) tablet Take 1 tablet  by mouth daily.        Marland Kitchen omeprazole (PRILOSEC OTC) 20 MG tablet Take 20 mg by mouth daily.       . pravastatin (PRAVACHOL) 80 MG tablet Take 80 mg by mouth daily.        Physical Exam: BP 137/85  Pulse 90  Resp 16  Ht 5' 10"  (1.778 m)  Wt 230 lb (104.327 kg)  BMI 33.00 kg/m2  SpO2 97% He looks well. Cardiac exam shows a regular rate and rhythm with normal heart sounds. Lung exam is clear. Chest incision is healing well and sternum stable. The right leg incision is healing well and there is no peripheral edema.  Diagnostic Tests:  Result     *RADIOLOGY REPORT*   Clinical Data: Status post CABG 3 weeks ago, some mid chest pain and cough   CHEST - 2 VIEW   Comparison: Portable chest x-ray of 06/21/2011   Findings: No active infiltrate or effusion is seen.  Mild cardiomegaly is stable.  Median sternotomy sutures are noted as well as a left atrial appendage  exclusion device.  A permanent pacemaker is present.  No acute bony abnormality is seen.   IMPRESSION: Stable mild cardiomegaly with permanent pacemaker.  No active lung disease.   Original Report Authenticated By: Joretta Bachelor, M.D.     Impression:  Overall he is making good progress following coronary bypass surgery and Maze procedure. He has not had recurrence of atrial fibrillation but is still in accelerated junctional rhythm. 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  Plan:  He will continue followup with cardiology and will contact me if he develops any problems with his incisions.

## 2011-07-18 NOTE — Addendum Note (Signed)
Addended by: Kathreen Cornfield on: 07/18/2011 08:49 AM   Modules accepted: Orders

## 2011-07-26 ENCOUNTER — Encounter (HOSPITAL_COMMUNITY): Payer: Self-pay

## 2011-07-26 ENCOUNTER — Encounter (HOSPITAL_COMMUNITY)
Admission: RE | Admit: 2011-07-26 | Discharge: 2011-07-26 | Disposition: A | Discharge status: Home / Self Care | Payer: Federal, State, Local not specified - PPO | Source: Ambulatory Visit | Attending: Cardiovascular Disease | Admitting: Cardiovascular Disease

## 2011-07-26 NOTE — Progress Notes (Signed)
Cardiac Rehab Medication Review by a Pharmacist  Does the patient  feel that his/her medications are working for him/her?  yes  Has the patient been experiencing any side effects to the medications prescribed? no  Does the patient measure his/her own blood pressure or blood glucose at home?  Does not monitor blood pressure, however monitors blood sugar every morning.  Does the patient have any problems obtaining medications due to transportation or finances?  no  Understanding of regimen: good Understanding of indications:  yesgood Potential of compliance: good excellent   Pharmacist comments: Patient's mother takes care of his medications. Patient's mother did most of the talking during session. Patient doesn't think it is necessary to monitor his blood pressure at home since he is  at the doctor so often.    Bola A. [place], [place] Pharmacist [phone] Phone [phone] 07/26/2011 8:44 AM

## 2011-07-30 ENCOUNTER — Encounter (HOSPITAL_COMMUNITY): Payer: Federal, State, Local not specified - PPO

## 2011-08-01 ENCOUNTER — Encounter (HOSPITAL_COMMUNITY): Payer: Federal, State, Local not specified - PPO

## 2011-08-03 ENCOUNTER — Encounter (HOSPITAL_COMMUNITY): Payer: Federal, State, Local not specified - PPO

## 2011-08-06 ENCOUNTER — Encounter (HOSPITAL_COMMUNITY)
Admission: RE | Admit: 2011-08-06 | Discharge: 2011-08-06 | Payer: Federal, State, Local not specified - PPO | Source: Ambulatory Visit | Attending: Cardiovascular Disease | Admitting: Cardiovascular Disease

## 2011-08-06 NOTE — Progress Notes (Signed)
Edward [name] 71 y.o. male       Nutrition Screen                                                                    YES  NO Do you live in a nursing home?  X   Do you eat out more than 3 times/week?    X If yes, how many times per week do you eat out? 3 times a week  Do you have food allergies?   X If yes, what are you allergic to?  Have you gained or lost more than 10 lbs without trying?               X If yes, how much weight have you lost and over what time period?   Do you want to lose weight?    X  If yes, what is a goal weight or amount of weight you would like to lose? 5-10 lb  Do you eat alone most of the time?   X   Do you eat less than 2 meals/day?  X If yes, how many meals do you eat?    Do you drink more than 3 alcohol drinks/day?  X If yes, how many drinks per day?   Are you having trouble with constipation? *  X If yes, what are you doing to help relieve constipation?  Do you have financial difficulties with buying food?*    X   Are you experiencing regular nausea/ vomiting?*     X   Do you have a poor appetite? *                                        X   Do you have trouble chewing/swallowing? *   X    Pt with diagnoses of:  X CABG              X GERD          X Dyslipidemia  / HDL< 40 / LDL>70 / High TG      X %  Body fat >goal / Body Mass Index >25 X HTN / BP >120/80 X DM/A1c >6 / CBG >126       Pt Risk Score   2       Diagnosis Risk Score  75       Total Risk Score   77                        X High Risk                Low Risk    HT: 69" Ht Readings from Last 1 Encounters:  07/26/11 5' 9"  (1.753 m)    WT:   233.4 lb (106.1 kg) Wt Readings from Last 3 Encounters:  07/26/11 233 lb 14.5 oz (106.1 kg)  07/17/11 230 lb (104.327 kg)  07/13/11 233 lb (105.688 kg)     IBW 72.7 146%IBW BMI 34.5 36.6%body fat  Meds reviewed: Calcium Carbonate with vitamin D, CoQ10, Lasix, I-Methylfolate-B6-B12, Magnesium Oxide, MVI, K-Dur Past Medical History  Diagnosis  Date  . A-fib   . HTN (hypertension)   . Diabetes mellitus   . Dyslipidemia   . PVD (peripheral vascular disease)     s/p Right CEA March, 2011,   . Claudication   . Hyperlipidemia   . GERD (gastroesophageal reflux disease)   . Diabetic neuropathy        Activity level: Pt is moderately active  Wt goal: 209-221 lb ( 95.2-100.6 kg) Current tobacco use? No Food/Drug Interaction? No Labs:  Lipid Panel     Component Value Date/Time   CHOL 136 02/14/2011 1025   TRIG 115.0 02/14/2011 1025   HDL 42.90 02/14/2011 1025   CHOLHDL 3 02/14/2011 1025   VLDL 23.0 02/14/2011 1025   LDLCALC 70 02/14/2011 1025   Lab Results  Component Value Date   HGBA1C 6.6* 06/12/2011   06/21/11 Glucose 117  LDL goal: < 70      DM and > 2:      HTN, family h/o, > 69 yo male Estimated Daily Nutrition Needs for: ? wt loss  1550-2050 Kcal , Total Fat 40-55gm, Saturated Fat 10-14 gm, Trans Fat 1.5-2.0 gm,  Sodium less than 1500 mg, Grams of CHO 175-250

## 2011-08-07 ENCOUNTER — Encounter: Payer: Federal, State, Local not specified - PPO | Admitting: Internal Medicine

## 2011-08-08 ENCOUNTER — Encounter (HOSPITAL_COMMUNITY): Payer: Federal, State, Local not specified - PPO

## 2011-08-10 ENCOUNTER — Encounter (HOSPITAL_COMMUNITY): Payer: Federal, State, Local not specified - PPO

## 2011-08-12 ENCOUNTER — Telehealth: Payer: Self-pay

## 2011-08-12 MED ORDER — L-METHYLFOLATE-B6-B12 3-35-2 MG PO TABS: 1.0000 | ORAL_TABLET | Freq: Every day | ORAL | Status: DC

## 2011-08-12 NOTE — Telephone Encounter (Signed)
PT WIFE NOTIFIED THAT RX WAS SENT IN

## 2011-08-12 NOTE — Telephone Encounter (Signed)
Pt states the formula in medication has changed and needs a new script called into [facility] [address] [place]   CB  [phone]

## 2011-08-12 NOTE — Telephone Encounter (Signed)
Sent to pharmacy 

## 2011-08-12 NOTE — Telephone Encounter (Signed)
Pt's wife states the formula on her husband's Metanx has changed. [facility] needs [facility].

## 2011-08-13 ENCOUNTER — Encounter (HOSPITAL_COMMUNITY): Payer: Federal, State, Local not specified - PPO

## 2011-08-15 ENCOUNTER — Encounter (HOSPITAL_COMMUNITY): Payer: Federal, State, Local not specified - PPO

## 2011-08-17 ENCOUNTER — Encounter (HOSPITAL_COMMUNITY): Payer: Federal, State, Local not specified - PPO

## 2011-08-20 ENCOUNTER — Encounter (HOSPITAL_COMMUNITY): Payer: Federal, State, Local not specified - PPO

## 2011-08-22 ENCOUNTER — Encounter (HOSPITAL_COMMUNITY): Payer: Federal, State, Local not specified - PPO

## 2011-08-24 ENCOUNTER — Encounter (HOSPITAL_COMMUNITY): Payer: Federal, State, Local not specified - PPO

## 2011-08-27 ENCOUNTER — Encounter (HOSPITAL_COMMUNITY)
Admission: RE | Admit: 2011-08-27 | Discharge: 2011-08-27 | Disposition: A | Discharge status: Home / Self Care | Payer: Federal, State, Local not specified - PPO | Source: Ambulatory Visit | Attending: Cardiovascular Disease | Admitting: Cardiovascular Disease

## 2011-08-27 DIAGNOSIS — I2510 Atherosclerotic heart disease of native coronary artery without angina pectoris: Secondary | ICD-10-CM | POA: Insufficient documentation

## 2011-08-27 DIAGNOSIS — Z5189 Encounter for other specified aftercare: Secondary | ICD-10-CM | POA: Insufficient documentation

## 2011-08-27 DIAGNOSIS — I4891 Unspecified atrial fibrillation: Secondary | ICD-10-CM | POA: Insufficient documentation

## 2011-08-27 LAB — GLUCOSE, CAPILLARY: Glucose-Capillary: 106 mg/dL — ABNORMAL HIGH (ref 70–99)

## 2011-08-27 NOTE — Progress Notes (Signed)
Pt started cardiac rehab today.  Pt tolerated light exercise without difficulty. Pt able to start cardiac rehab after extended travel with his son's move.  Monitor showed SR with one couplet noted.  Pt denied any complaints of symptoms.  Continue to monitor.

## 2011-08-28 ENCOUNTER — Other Ambulatory Visit: Payer: Self-pay | Admitting: Physician Assistant

## 2011-08-28 ENCOUNTER — Ambulatory Visit (INDEPENDENT_AMBULATORY_CARE_PROVIDER_SITE_OTHER): Payer: Federal, State, Local not specified - PPO | Admitting: Cardiovascular Disease

## 2011-08-28 ENCOUNTER — Encounter: Payer: Self-pay | Admitting: Cardiovascular Disease

## 2011-08-28 VITALS — BP 137/73 | HR 84 | Ht 70.0 in | Wt 236.8 lb

## 2011-08-28 DIAGNOSIS — I4891 Unspecified atrial fibrillation: Secondary | ICD-10-CM

## 2011-08-28 DIAGNOSIS — I2510 Atherosclerotic heart disease of native coronary artery without angina pectoris: Secondary | ICD-10-CM

## 2011-08-28 NOTE — Patient Instructions (Addendum)
Your physician wants you to follow-up in: 6 months  You will receive a reminder letter in the mail two months in advance. If you don't receive a letter, please call our office to schedule the follow-up appointment.  Your physician recommends that you continue on your current medications as directed. Please refer to the Current Medication list given to you today.  

## 2011-08-28 NOTE — Progress Notes (Signed)
Edward [name], Edward [name]  [address]    [place]   [place], [place]  [address]    [place], [place]  [address] [phone]  Fax  [phone]  [phone]  Fax [phone]  Problem list:  1. Atrial fibrillation- not currently on any anticoagulation because of his history of severe GI bleed 2. Hypertension 3. Diabetes mellitus 4. Dyslipidemia 5. Peripheral vascular disease:-Status post right carotid endarterectomy and status post PTA by Dr. Sherren Mocha 6. History of GI bleed-currently not on antiplatelet agents or anticoagulants-  7. Anemia 8. History of TIA 9. Pacer 10 CABG - MAZE - Jun 12, 2011  History of Present Illness:  Edward [name]  Is a 71 y.o. gentleman with a history as noted above. He had coronary artery bypass grafting in May of this year. He also had a Maze procedure.  Current Outpatient Prescriptions on File Prior to Visit  Medication Sig Dispense Refill  . amLODipine (NORVASC) 5 MG tablet Take 5 mg by mouth daily.      Marland Kitchen aspirin 81 MG tablet Take 81 mg by mouth daily.      . Calcium Carbonate-Vitamin D (CALCIUM + D PO) Take 1,200 mg by mouth daily.        . chlorpheniramine (CHLOR-TRIMETON) 4 MG tablet Take 4 mg by mouth 2 (two) times daily as needed.      . Coenzyme Q10 (COQ10) 200 MG CAPS Take 1 capsule by mouth daily.        . Exenatide (BYDUREON) 2 MG SUSR Inject into the skin once a week.      . furosemide (LASIX) 40 MG tablet Take 40 mg by mouth daily.      Marland Kitchen l-methylfolate-B6-B12 (METANX) 3-35-2 MG TABS Take 1 tablet by mouth daily.  60 tablet  5  . loratadine (CLARITIN) 10 MG tablet Take 10 mg by mouth daily.      Marland Kitchen losartan (COZAAR) 50 MG tablet Take 1 tablet (50 mg total) by mouth daily.  30 tablet  1  . Magnesium Oxide 250 MG TABS Take 250 mg by mouth daily.      . metFORMIN (GLUCOPHAGE) 1000 MG tablet Take 1,000 mg by mouth 2 (two) times daily with a meal.       .  metoprolol (LOPRESSOR) 50 MG tablet Take 1 tablet (50 mg total) by mouth 2 (two) times daily.  60 tablet  6  . Multiple Vitamin (MULTIVITAMIN) tablet Take 1 tablet by mouth daily.        Marland Kitchen omeprazole (PRILOSEC OTC) 20 MG tablet Take 20 mg by mouth daily.       . potassium chloride SA (K-DUR,KLOR-CON) 20 MEQ tablet Take 20 mEq by mouth daily.      . pravastatin (PRAVACHOL) 80 MG tablet Take 80 mg by mouth daily.        Allergies  Allergen Reactions  . Fenofibrate Other (See Comments)    Reaction unknown  . Lipitor (Atorvastatin Calcium) Other (See Comments)    Reaction unknown    Past Medical History  Diagnosis Date  . A-fib   . HTN (hypertension)   . Diabetes mellitus   . Dyslipidemia   . PVD (peripheral vascular disease)     s/p Right CEA March, 2011,   . Claudication   . Hyperlipidemia   . GERD (gastroesophageal reflux disease)   . Diabetic neuropathy  Past Surgical History  Procedure Date  . Carotid endarterectomy     RIGHT  . Cholecystectomy   . Tonsillectomy and adenoidectomy   . Knee surgery   . Coronary artery bypass graft 06/14/2011    Procedure: CORONARY ARTERY BYPASS GRAFTING (CABG);  Surgeon: Gaye Pollack, MD;  Location: [facility];  Service: Open Heart Surgery;  Laterality: N/A;  Coronary Artery Bypass Graft on pump times six;  utilizing internal mammary artery and right greater saphenous vein harvested endoscopically.  . Maze 06/14/2011    Procedure: MAZE;  Surgeon: Gaye Pollack, MD;  Location: [facility];  Service: Open Heart Surgery;  Laterality: N/A;  Ligate left atrial appendage    History  Smoking status  . Former Smoker  . Types: Cigarettes  . Quit date: 01/16/1979  Smokeless tobacco  . Never Used    History  Alcohol Use No    OCC.    Family History  Problem Relation Age of Onset  . Coronary artery disease Father   . Hypertension Father   . Heart attack Mother   . Coronary artery disease Mother   . Hypertension Sister   . Anesthesia  problems Neg Hx     Reviw of Systems:  Reviewed in the HPI.  All other systems are negative.  Physical Exam: BP 137/73  Pulse 84  Ht 5' 10"  (1.778 m)  Wt 236 lb 12.8 oz (107.412 kg)  BMI 33.98 kg/m2 The patient is alert and oriented x 3.  The mood and affect are normal.   Skin: warm and dry.  Color is normal.    HEENT:   Normocephalic/atraumatic. He has a right carotid endarterectomy scar. His carotids are 2+. No JVD. His neck is supple. The membranes are moist.  Lungs: Lungs are clear to   Heart: Regular rate. No murmurs.  His sternotomy scar is healing well. His thoracostomy sites are healing well.  Abdomen: Good bowel sounds. No hepatosplenomegaly. His abdomen is nontender.  Extremities:  No clubbing cyanosis or edema.  Neuro:  Neuro exam is nonfocal.    ECG:   Assessment / Plan:

## 2011-08-28 NOTE — Assessment & Plan Note (Signed)
He status post coronary artery bypass grafting. He also had a Maze procedure during his surgery. He's had some bradycardia and subsequent had a pacemaker placed. He seems to be recovering quite nicely from his bypass surgery. I reminded him of his restrictions including lifting no more than 10 pounds for the first 3 months after surgery and then no more than 30 pounds for the next 3 months. He seems to be quite pleased that he is recovering quite nicely.

## 2011-08-28 NOTE — Assessment & Plan Note (Signed)
He Is status post maze procedure.  He has a pacemaker now.

## 2011-08-29 ENCOUNTER — Encounter (HOSPITAL_COMMUNITY)
Admission: RE | Admit: 2011-08-29 | Discharge: 2011-08-29 | Disposition: A | Discharge status: Home / Self Care | Payer: Federal, State, Local not specified - PPO | Source: Ambulatory Visit | Attending: Cardiovascular Disease | Admitting: Cardiovascular Disease

## 2011-08-29 ENCOUNTER — Other Ambulatory Visit: Payer: Self-pay | Admitting: Cardiovascular Disease

## 2011-08-29 ENCOUNTER — Telehealth: Payer: Self-pay

## 2011-08-29 LAB — GLUCOSE, CAPILLARY
Glucose-Capillary: 158 mg/dL — ABNORMAL HIGH (ref 70–99)
Glucose-Capillary: 115 mg/dL — ABNORMAL HIGH (ref 70–99)

## 2011-08-29 MED ORDER — LOSARTAN POTASSIUM 50 MG PO TABS: 50.0000 mg | ORAL_TABLET | Freq: Every day | ORAL | Status: DC

## 2011-08-29 NOTE — Telephone Encounter (Signed)
Losartan 192m pt had airline physical with Dr DEverlene Farrierin Feb but I do not see record from Dr GElder Cyphers Will pull chart.

## 2011-08-29 NOTE — Telephone Encounter (Signed)
Dr. Elder Cyphers rx'ed #90 with 3 refills on 1/28 so should have refills at pharmacy. Please have patient call pharmacy to verify

## 2011-08-29 NOTE — Progress Notes (Signed)
Edward [name] 71 y.o. male Nutrition Note Spoke with pt.  Nutrition Plan and cholesterol goals reviewed with pt. Per discussion with pt, pt is not interested in changing his nutrition habits at this time. Pt wants to lose wt and is currently not doing anything to help promote wt loss except "exercising her 3 days a week." Wt loss tips discussed. Pt is diabetic. Last A1c indicates blood glucose well controlled. Spoke with pt's wife, who feels pt is denial about his diabetes. Pt has not received any education re: DM "except what I've read on the computer." Pt states he is not interested in DM education at this time. Nutrition Diagnosis   Food-and nutrition-related knowledge deficit related to lack of exposure to information as related to diagnosis of: ? CVD ? DM (A1c 6.6)   Obesity related to excessive energy intake as evidenced by a BMI of 34.5  Nutrition RX/ Estimated Daily Nutrition Needs for: wt loss  1550-2050 Kcal, 40-55 gm fat, 10-14 gm sat fat, 1.5-2.0 gm trans-fat, <1500 mg sodium , 175-250 gm CHO   Nutrition Intervention   Pt's individual nutrition plan including cholesterol goals reviewed with pt.   Pt to attend the Portion Distortion class   Pt to attend the  ? Nutrition I class                         ? Nutrition II class        ? Diabetes Blitz class       ? Diabetes Q & A class   Pt given handouts for: ? wt loss ? DM   Continue client-centered nutrition education by RD, as part of interdisciplinary care. Goal(s)   Pt to identify and limit food sources of saturated fat, trans fat, and cholesterol   Pt to identify food quantities necessary to achieve: ? wt loss to a goal wt of 209-221 lb (95.2-100.6 kg) at graduation from cardiac rehab.  Monitor and Evaluate progress toward nutrition goal with team.

## 2011-08-29 NOTE — Telephone Encounter (Signed)
Fax Received. Refill Completed. Edward [name] (R.M.A)   

## 2011-08-29 NOTE — Telephone Encounter (Signed)
Edward [name] STATES HER HUSBAND IS OUT OF HIS LOSARTAN AND THEY LAST HAD IF FILLED BY DR Tacy Dura, AND SINCE DR GUEST IS HIS DR NOW, THEY WOULD LIKE TO HAVE SOME CALLED IN PLEASE CALL [phone]    [facility] [address]

## 2011-08-30 ENCOUNTER — Other Ambulatory Visit: Payer: Self-pay | Admitting: Internal Medicine

## 2011-08-30 NOTE — Telephone Encounter (Signed)
Called patient. Left message to advise Rx should be ready at pharmacy.

## 2011-08-30 NOTE — Telephone Encounter (Signed)
I have called pharmacy, wife states Rx not there. #90 with 3 refills, pharmacy states the Rx is there for 100 mg and they will fill this for him, but he also has Rx from another provider for 50 mg, I advised our dose for him has been 100 mg, I do not see why/ when this was changed, do you?

## 2011-08-30 NOTE — Telephone Encounter (Signed)
Dr. Cathie Olden changed the Rx according to the med log in epic.

## 2011-08-31 ENCOUNTER — Encounter (HOSPITAL_COMMUNITY)
Admission: RE | Admit: 2011-08-31 | Discharge: 2011-08-31 | Disposition: A | Discharge status: Home / Self Care | Payer: Federal, State, Local not specified - PPO | Source: Ambulatory Visit | Attending: Cardiovascular Disease | Admitting: Cardiovascular Disease

## 2011-08-31 LAB — GLUCOSE, CAPILLARY
Glucose-Capillary: 111 mg/dL — ABNORMAL HIGH (ref 70–99)
Glucose-Capillary: 83 mg/dL (ref 70–99)

## 2011-09-03 ENCOUNTER — Encounter (HOSPITAL_COMMUNITY)
Admission: RE | Admit: 2011-09-03 | Discharge: 2011-09-03 | Disposition: A | Discharge status: Home / Self Care | Payer: Federal, State, Local not specified - PPO | Source: Ambulatory Visit | Attending: Cardiovascular Disease | Admitting: Cardiovascular Disease

## 2011-09-05 ENCOUNTER — Encounter (HOSPITAL_COMMUNITY)
Admission: RE | Admit: 2011-09-05 | Discharge: 2011-09-05 | Disposition: A | Discharge status: Home / Self Care | Payer: Federal, State, Local not specified - PPO | Source: Ambulatory Visit | Attending: Cardiovascular Disease | Admitting: Cardiovascular Disease

## 2011-09-07 ENCOUNTER — Encounter (HOSPITAL_COMMUNITY)
Admission: RE | Admit: 2011-09-07 | Discharge: 2011-09-07 | Disposition: A | Discharge status: Home / Self Care | Payer: Federal, State, Local not specified - PPO | Source: Ambulatory Visit | Attending: Cardiovascular Disease | Admitting: Cardiovascular Disease

## 2011-09-10 ENCOUNTER — Encounter (HOSPITAL_COMMUNITY): Payer: Federal, State, Local not specified - PPO

## 2011-09-12 ENCOUNTER — Encounter (HOSPITAL_COMMUNITY): Payer: Federal, State, Local not specified - PPO

## 2011-09-14 ENCOUNTER — Encounter (HOSPITAL_COMMUNITY): Payer: Federal, State, Local not specified - PPO

## 2011-09-14 ENCOUNTER — Ambulatory Visit (INDEPENDENT_AMBULATORY_CARE_PROVIDER_SITE_OTHER): Payer: Federal, State, Local not specified - PPO | Admitting: Internal Medicine

## 2011-09-14 ENCOUNTER — Encounter: Payer: Self-pay | Admitting: Internal Medicine

## 2011-09-14 VITALS — BP 141/85 | HR 88 | Ht 70.0 in | Wt 236.0 lb

## 2011-09-14 DIAGNOSIS — I498 Other specified cardiac arrhythmias: Secondary | ICD-10-CM

## 2011-09-14 DIAGNOSIS — I4891 Unspecified atrial fibrillation: Secondary | ICD-10-CM

## 2011-09-14 DIAGNOSIS — R0609 Other forms of dyspnea: Secondary | ICD-10-CM

## 2011-09-14 DIAGNOSIS — R0989 Other specified symptoms and signs involving the circulatory and respiratory systems: Secondary | ICD-10-CM

## 2011-09-14 DIAGNOSIS — I495 Sick sinus syndrome: Secondary | ICD-10-CM

## 2011-09-14 LAB — PACEMAKER DEVICE OBSERVATION
AL IMPEDENCE PM: 480 Ohm
ATRIAL PACING PM: 99
BAMS-0001: 160 {beats}/min
RV LEAD AMPLITUDE: 15.68 mv
RV LEAD IMPEDENCE PM: 636 Ohm
RV LEAD THRESHOLD: 1 V
VENTRICULAR PACING PM: 87

## 2011-09-14 NOTE — Assessment & Plan Note (Signed)
As above.

## 2011-09-14 NOTE — Patient Instructions (Signed)
Your physician recommends that you schedule a follow-up appointment in: 10 weeks.  Your physician recommends that you continue on your current medications as directed. Please refer to the Current Medication list given to you today.

## 2011-09-14 NOTE — Progress Notes (Signed)
Patient Care Team: [name], MD as PCP - General (Internal Medicine) Elayne Snare, MD as Attending Physician (Endocrinology) Angelia Mould, MD as Attending Physician (Vascular Surgery)   HPI  Edward [name] is a 71 y.o. male Seen following pacemaker implantation in early June for sinus node dysfunction and rapid junctional rhythm following his bypass maze surgery.   He is having problems with exercise intolerance  Past Medical History  Diagnosis Date  . A-fib   . HTN (hypertension)   . Diabetes mellitus   . Dyslipidemia   . PVD (peripheral vascular disease)     s/p Right CEA March, 2011,   . Claudication   . Hyperlipidemia   . GERD (gastroesophageal reflux disease)   . Diabetic neuropathy     Past Surgical History  Procedure Date  . Carotid endarterectomy     RIGHT  . Cholecystectomy   . Tonsillectomy and adenoidectomy   . Knee surgery   . Coronary artery bypass graft 06/14/2011    Procedure: CORONARY ARTERY BYPASS GRAFTING (CABG);  Surgeon: Gaye Pollack, MD;  Location: [facility];  Service: Open Heart Surgery;  Laterality: N/A;  Coronary Artery Bypass Graft on pump times six;  utilizing internal mammary artery and right greater saphenous vein harvested endoscopically.  . Maze 06/14/2011    Procedure: MAZE;  Surgeon: Gaye Pollack, MD;  Location: [facility];  Service: Open Heart Surgery;  Laterality: N/A;  Ligate left atrial appendage    Current Outpatient Prescriptions  Medication Sig Dispense Refill  . amLODipine (NORVASC) 5 MG tablet Take 5 mg by mouth daily.      Marland Kitchen aspirin 81 MG tablet Take 81 mg by mouth daily.      . Calcium Carbonate-Vitamin D (CALCIUM + D PO) Take 1,200 mg by mouth daily.        . Coenzyme Q10 (COQ10) 200 MG CAPS Take 1 capsule by mouth daily.        . Exenatide (BYDUREON) 2 MG SUSR Inject into the skin once a week.      . furosemide (LASIX) 40 MG tablet Take 40 mg by mouth daily.      Marland Kitchen l-methylfolate-B6-B12 (METANX) 3-35-2 MG TABS Take 1  tablet by mouth daily.  60 tablet  5  . loratadine (CLARITIN) 10 MG tablet Take 10 mg by mouth daily.      Marland Kitchen losartan (COZAAR) 50 MG tablet Take 1 tablet (50 mg total) by mouth daily.  90 tablet  3  . Magnesium Oxide 250 MG TABS Take 250 mg by mouth daily.      . metFORMIN (GLUCOPHAGE) 1000 MG tablet Take 1,000 mg by mouth 2 (two) times daily with a meal.       . metoprolol (LOPRESSOR) 50 MG tablet Take 1 tablet (50 mg total) by mouth 2 (two) times daily.  60 tablet  6  . Multiple Vitamin (MULTIVITAMIN) tablet Take 1 tablet by mouth daily.        Marland Kitchen omeprazole (PRILOSEC OTC) 20 MG tablet Take 20 mg by mouth daily.       . potassium chloride SA (K-DUR,KLOR-CON) 20 MEQ tablet Take 20 mEq by mouth daily.      . pravastatin (PRAVACHOL) 80 MG tablet Take 80 mg by mouth daily.        Allergies  Allergen Reactions  . Fenofibrate Other (See Comments)    Reaction unknown  . Lipitor (Atorvastatin Calcium) Other (See Comments)    Reaction unknown  Review of Systems negative except from HPI and PMH  Physical Exam BP 141/85  Pulse 88  Ht 5' 10"  (1.778 m)  Wt 236 lb (107.049 kg)  BMI 33.86 kg/m2 Well developed and well nourished in no acute distress HENT normal E scleral and icterus clear Neck Supple JVP flat; carotids brisk and full Clear to ausculation Device pocket well healed; without hematoma or erythema   Regular rate and rhythm, no murmurs gallops or rub Soft with active bowel sounds No clubbing cyanosis none Edema Alert and oriented, grossly normal motor and sensory function Skin Warm and Dry    Assessment and  Plan

## 2011-09-14 NOTE — Assessment & Plan Note (Signed)
or the patient has a sodium junctional rhythm which has been associated with an unusual pacing out of rhythm. His exercise intolerance and with his sinus node dysfunction he has had no chronotropic response to exercise. I we have activated rate response

## 2011-09-17 ENCOUNTER — Encounter (HOSPITAL_COMMUNITY): Payer: Federal, State, Local not specified - PPO | Attending: Cardiovascular Disease

## 2011-09-17 DIAGNOSIS — I4891 Unspecified atrial fibrillation: Secondary | ICD-10-CM | POA: Insufficient documentation

## 2011-09-17 DIAGNOSIS — I2510 Atherosclerotic heart disease of native coronary artery without angina pectoris: Secondary | ICD-10-CM | POA: Insufficient documentation

## 2011-09-17 DIAGNOSIS — Z5189 Encounter for other specified aftercare: Secondary | ICD-10-CM | POA: Insufficient documentation

## 2011-09-18 ENCOUNTER — Other Ambulatory Visit: Payer: Self-pay | Admitting: Physician Assistant

## 2011-09-18 ENCOUNTER — Telehealth: Payer: Self-pay | Admitting: Internal Medicine

## 2011-09-18 ENCOUNTER — Ambulatory Visit (INDEPENDENT_AMBULATORY_CARE_PROVIDER_SITE_OTHER): Payer: Federal, State, Local not specified - PPO | Admitting: *Deleted

## 2011-09-18 DIAGNOSIS — I495 Sick sinus syndrome: Secondary | ICD-10-CM

## 2011-09-18 LAB — PACEMAKER DEVICE OBSERVATION
ATRIAL PACING PM: 98.3
BAMS-0001: 160 {beats}/min
BATTERY VOLTAGE: 2.78 V
VENTRICULAR PACING PM: 88.3

## 2011-09-18 NOTE — Telephone Encounter (Signed)
New problem:  C/o since lower the rate of pacer maker, patient having a hard time breathing.  No chestpain .

## 2011-09-18 NOTE — Telephone Encounter (Signed)
Spoke w/Dr Caryl Comes and instructed to change rate back to 80 and leave rate response on. Spoke w/pt and pt to come by in 20 minutes for changes.

## 2011-09-18 NOTE — Progress Notes (Signed)
Pacer check for change of lower rate per SK.

## 2011-09-19 ENCOUNTER — Telehealth: Payer: Self-pay | Admitting: *Deleted

## 2011-09-19 ENCOUNTER — Telehealth: Payer: Self-pay | Admitting: Internal Medicine

## 2011-09-19 ENCOUNTER — Encounter (HOSPITAL_COMMUNITY): Payer: Federal, State, Local not specified - PPO

## 2011-09-19 NOTE — Telephone Encounter (Signed)
Walk In Pt Form " Edward [name] has Shortness of Breath" Sent to Message Nurse  09/19/11/KM

## 2011-09-19 NOTE — Telephone Encounter (Signed)
Walk- in form left on the message cart from 09/18/11. Per Mimi, the patient's PPM was adjusted recently and he is complaining of gettting SOB. Per the patient's chart. He was evaluated yesterday by the device clinic.

## 2011-09-21 ENCOUNTER — Telehealth (HOSPITAL_COMMUNITY): Payer: Self-pay | Admitting: *Deleted

## 2011-09-21 ENCOUNTER — Encounter (HOSPITAL_COMMUNITY): Payer: Federal, State, Local not specified - PPO

## 2011-09-21 NOTE — Telephone Encounter (Signed)
Message left regarding absence from cardiac rehab. Last exercise session attended was 09/07/11.

## 2011-09-23 ENCOUNTER — Ambulatory Visit (INDEPENDENT_AMBULATORY_CARE_PROVIDER_SITE_OTHER): Payer: Federal, State, Local not specified - PPO | Admitting: Family Medicine

## 2011-09-23 VITALS — BP 118/71 | HR 86 | Temp 98.0°F | Resp 16 | Ht 69.0 in | Wt 237.0 lb

## 2011-09-23 DIAGNOSIS — Z23 Encounter for immunization: Secondary | ICD-10-CM

## 2011-09-24 ENCOUNTER — Encounter (HOSPITAL_COMMUNITY): Payer: Federal, State, Local not specified - PPO

## 2011-09-26 ENCOUNTER — Encounter (HOSPITAL_COMMUNITY): Payer: Federal, State, Local not specified - PPO

## 2011-09-28 ENCOUNTER — Encounter (HOSPITAL_COMMUNITY): Payer: Federal, State, Local not specified - PPO

## 2011-09-28 ENCOUNTER — Other Ambulatory Visit: Payer: Self-pay | Admitting: Internal Medicine

## 2011-09-30 NOTE — Telephone Encounter (Signed)
Need chart

## 2011-10-01 ENCOUNTER — Encounter (HOSPITAL_COMMUNITY): Payer: Federal, State, Local not specified - PPO

## 2011-10-01 NOTE — Telephone Encounter (Signed)
Patient's chart is at the nurses station in the pa pool pile.  [place] [id number]

## 2011-10-03 ENCOUNTER — Encounter (HOSPITAL_COMMUNITY): Payer: Federal, State, Local not specified - PPO

## 2011-10-05 ENCOUNTER — Encounter (HOSPITAL_COMMUNITY): Payer: Federal, State, Local not specified - PPO

## 2011-10-08 ENCOUNTER — Encounter: Payer: Self-pay | Admitting: Internal Medicine

## 2011-10-08 ENCOUNTER — Encounter (HOSPITAL_COMMUNITY): Payer: Federal, State, Local not specified - PPO

## 2011-10-10 ENCOUNTER — Encounter (HOSPITAL_COMMUNITY): Payer: Federal, State, Local not specified - PPO

## 2011-10-11 ENCOUNTER — Other Ambulatory Visit: Payer: Self-pay | Admitting: Physician Assistant

## 2011-10-12 ENCOUNTER — Encounter (HOSPITAL_COMMUNITY): Payer: Federal, State, Local not specified - PPO

## 2011-10-15 ENCOUNTER — Encounter (HOSPITAL_COMMUNITY): Payer: Federal, State, Local not specified - PPO

## 2011-10-17 ENCOUNTER — Encounter (HOSPITAL_COMMUNITY): Payer: Federal, State, Local not specified - PPO

## 2011-10-19 ENCOUNTER — Encounter (HOSPITAL_COMMUNITY): Payer: Federal, State, Local not specified - PPO

## 2011-10-22 ENCOUNTER — Encounter (HOSPITAL_COMMUNITY): Payer: Federal, State, Local not specified - PPO

## 2011-10-24 ENCOUNTER — Encounter (HOSPITAL_COMMUNITY): Payer: Federal, State, Local not specified - PPO

## 2011-10-26 ENCOUNTER — Encounter (HOSPITAL_COMMUNITY): Payer: Federal, State, Local not specified - PPO

## 2011-10-29 ENCOUNTER — Encounter (HOSPITAL_COMMUNITY): Payer: Federal, State, Local not specified - PPO

## 2011-10-31 ENCOUNTER — Encounter (HOSPITAL_COMMUNITY): Payer: Federal, State, Local not specified - PPO

## 2011-11-02 ENCOUNTER — Encounter (HOSPITAL_COMMUNITY): Payer: Federal, State, Local not specified - PPO

## 2011-11-23 ENCOUNTER — Encounter: Payer: Self-pay | Admitting: Internal Medicine

## 2011-11-23 ENCOUNTER — Ambulatory Visit (INDEPENDENT_AMBULATORY_CARE_PROVIDER_SITE_OTHER): Payer: Federal, State, Local not specified - PPO | Admitting: Internal Medicine

## 2011-11-23 VITALS — BP 145/92 | HR 92 | Resp 18 | Ht 70.0 in | Wt 237.2 lb

## 2011-11-23 DIAGNOSIS — I498 Other specified cardiac arrhythmias: Secondary | ICD-10-CM

## 2011-11-23 DIAGNOSIS — Z950 Presence of cardiac pacemaker: Secondary | ICD-10-CM

## 2011-11-23 DIAGNOSIS — I495 Sick sinus syndrome: Secondary | ICD-10-CM

## 2011-11-23 DIAGNOSIS — I4891 Unspecified atrial fibrillation: Secondary | ICD-10-CM

## 2011-11-23 LAB — PACEMAKER DEVICE OBSERVATION
ATRIAL PACING PM: 100
BAMS-0001: 160 {beats}/min
BATTERY VOLTAGE: 2.78 V
VENTRICULAR PACING PM: 82

## 2011-11-23 NOTE — Assessment & Plan Note (Signed)
The patient's device was interrogated.  The information was reviewed. No changes were made in the programming.    

## 2011-11-23 NOTE — Assessment & Plan Note (Signed)
Suppressed with atrial overdrive pacing we will activate sleep mode

## 2011-11-23 NOTE — Progress Notes (Signed)
Patient Care Team: [name], MD as PCP - General (Internal Medicine) Elayne Snare, MD as Attending Physician (Endocrinology) Angelia Mould, MD as Attending Physician (Vascular Surgery)   HPI  Edward [name] is a 71 y.o. male Seen following pacemaker implantation for sinus node dysfunction and rapid junctional rhythm following bypass. Because of problems with exercise tolerance reactivated rate response in the hope of overdriving his junctional rhythm  He is with this change vastly improved  Past Medical History  Diagnosis Date  . A-fib   . HTN (hypertension)   . Diabetes mellitus   . Dyslipidemia   . PVD (peripheral vascular disease)     s/p Right CEA March, 2011,   . Claudication   . Hyperlipidemia   . GERD (gastroesophageal reflux disease)   . Diabetic neuropathy     Past Surgical History  Procedure Date  . Carotid endarterectomy     RIGHT  . Cholecystectomy   . Tonsillectomy and adenoidectomy   . Knee surgery   . 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  . Maze 06/14/2011    Procedure: MAZE;  Surgeon: Gaye Pollack, MD;  Location: [facility];  Service: Open Heart Surgery;  Laterality: N/A;  Ligate left atrial appendage    Current Outpatient Prescriptions  Medication Sig Dispense Refill  . amLODipine (NORVASC) 5 MG tablet Take 5 mg by mouth daily.      Marland Kitchen aspirin 81 MG tablet Take 81 mg by mouth daily.      . Coenzyme Q10 (COQ10) 200 MG CAPS Take 1 capsule by mouth daily.        . Exenatide (BYDUREON) 2 MG SUSR Inject into the skin once a week.      . furosemide (LASIX) 40 MG tablet TAKE 1 TABLET BY MOUTH EVERY DAY  30 tablet  1  . KLOR-CON M20 20 MEQ tablet TAKE 1 TABLET  EVERY DAY  30 each  1  . l-methylfolate-B6-B12 (METANX) 3-35-2 MG TABS Take 1 tablet by mouth daily.  60 tablet  5  . loratadine (CLARITIN) 10 MG tablet Take 10 mg by mouth daily.      Marland Kitchen losartan (COZAAR) 50 MG tablet Take 1 tablet (50 mg total) by mouth daily.  90 tablet  3  . Magnesium Oxide 250 MG TABS Take 250 mg by mouth daily.      . metFORMIN (GLUCOPHAGE) 1000 MG tablet Take 1,000 mg by mouth 2 (two) times daily with a meal. 1,500 mg in AM & 1,000 mg at PM.      . metoprolol (LOPRESSOR) 50 MG tablet Take 1 tablet (50 mg total) by mouth 2 (two) times daily.  60 tablet  6  . Multiple Vitamin (MULTIVITAMIN) tablet Take 1 tablet by mouth daily.        Marland Kitchen omeprazole (PRILOSEC OTC) 20 MG tablet Take 20 mg by mouth daily.       . pravastatin (PRAVACHOL) 80 MG tablet Take 80 mg by mouth daily.        Allergies  Allergen Reactions  . Lipitor (Atorvastatin Calcium) Other (See Comments)    Reaction unknown    Review of Systems negative except from HPI and PMH  Physical Exam BP 145/92  Pulse 92  Resp 18  Ht 5' 10"  (1.778 m)  Wt 237 lb 3.2 oz (107.593 kg)  BMI 34.03 kg/m2  SpO2 98% Well developed and well nourished in no acute distress HENT normal E scleral and icterus clear Neck Supple JVP flat; carotids brisk and full Clear to ausculation Regular rate and rhythm, no murmurs gallops or rub Soft with active bowel sounds No clubbing cyanosis none Edema Alert and oriented, grossly normal motor and sensory function Skin Warm and Dry    Assessment and  Plan

## 2011-12-05 ENCOUNTER — Other Ambulatory Visit: Payer: Self-pay | Admitting: Physician Assistant

## 2011-12-05 NOTE — Telephone Encounter (Signed)
Please pull paper chart

## 2011-12-06 NOTE — Telephone Encounter (Signed)
Chart pulled to pa pool at nurses station [phone]

## 2011-12-07 DIAGNOSIS — Z0271 Encounter for disability determination: Secondary | ICD-10-CM

## 2011-12-25 ENCOUNTER — Encounter: Payer: Self-pay | Admitting: Vascular Surgery

## 2011-12-26 ENCOUNTER — Ambulatory Visit (INDEPENDENT_AMBULATORY_CARE_PROVIDER_SITE_OTHER): Payer: Federal, State, Local not specified - PPO | Admitting: Vascular Surgery

## 2011-12-26 ENCOUNTER — Encounter: Payer: Self-pay | Admitting: Vascular Surgery

## 2011-12-26 VITALS — BP 132/67 | HR 84 | Resp 16 | Ht 70.0 in | Wt 238.7 lb

## 2011-12-26 DIAGNOSIS — I70219 Atherosclerosis of native arteries of extremities with intermittent claudication, unspecified extremity: Secondary | ICD-10-CM

## 2011-12-26 NOTE — Progress Notes (Signed)
[facility] and [facility] Specialist of [facility]  Patient name: Edward [name] MRN: [id number] DOB: 1940-03-23 Sex: male  REASON FOR VISIT: left lower extremity claudication.  HPI: CHARELS [name] is a 71 y.o. male live known for many years. Most recently he underwent a right carotid endarterectomy in March of 2011. He has had stable claudication of his lower extremities. He has undergone successful revascularization on the right side by Dr. Sherren Mocha. He is now having significant claudication symptoms on the left. He experiences pain in the left calf which is brought on by ambulation and relieved with rest. This occurs at approximately 2 blocks. His symptoms have been gradually progressive. He's had no significant symptoms on the right side. He denies thyroid of claudication on the left side. He denies rest pain on either side. He has not had any nonhealing wounds.   He denies any history of stroke, TIAs, expressive or receptive aphasia, or amaurosis fugax.  Past Medical History  Diagnosis Date  . A-fib   . HTN (hypertension)   . Diabetes mellitus   . Dyslipidemia   . PVD (peripheral vascular disease)     s/p Right CEA March, 2011,   . Claudication   . Hyperlipidemia   . GERD (gastroesophageal reflux disease)   . Diabetic neuropathy     Family History  Problem Relation Age of Onset  . Coronary artery disease Father   . Hypertension Father   . Heart attack Mother   . Coronary artery disease Mother   . Hypertension Sister   . Anesthesia problems Neg Hx     SOCIAL HISTORY: History  Substance Use Topics  . Smoking status: Former Smoker    Types: Cigarettes    Quit date: 01/16/1979  . Smokeless tobacco: Never Used  . Alcohol Use: No     Comment: OCC.    Allergies  Allergen Reactions  . Lipitor (Atorvastatin Calcium) Other (See Comments)    Reaction unknown    Current Outpatient Prescriptions  Medication Sig Dispense Refill  . amLODipine (NORVASC) 5 MG tablet Take 5 mg  by mouth daily.      Marland Kitchen aspirin 81 MG tablet Take 81 mg by mouth daily.      . Coenzyme Q10 (COQ10) 200 MG CAPS Take 1 capsule by mouth daily.        . Exenatide (BYDUREON) 2 MG SUSR Inject into the skin once a week.      . furosemide (LASIX) 40 MG tablet TAKE 1 TABLET BY MOUTH EVERY DAY  30 tablet  1  . KLOR-CON M20 20 MEQ tablet TAKE 1 TABLET DAILY  30 tablet  1  . l-methylfolate-B6-B12 (METANX) 3-35-2 MG TABS Take 1 tablet by mouth daily.  60 tablet  5  . loratadine (CLARITIN) 10 MG tablet Take 10 mg by mouth daily.      Marland Kitchen losartan (COZAAR) 50 MG tablet Take 1 tablet (50 mg total) by mouth daily.  90 tablet  3  . Magnesium Oxide 250 MG TABS Take 250 mg by mouth daily.      . metFORMIN (GLUCOPHAGE) 1000 MG tablet Take 1,000 mg by mouth 2 (two) times daily with a meal. 1,500 mg in AM & 1,000 mg at PM.      . metoprolol (LOPRESSOR) 50 MG tablet Take 1 tablet (50 mg total) by mouth 2 (two) times daily.  60 tablet  6  . Multiple Vitamin (MULTIVITAMIN) tablet Take 1 tablet by mouth daily.        Marland Kitchen  omeprazole (PRILOSEC OTC) 20 MG tablet Take 20 mg by mouth daily.       . pravastatin (PRAVACHOL) 80 MG tablet Take 80 mg by mouth daily.        REVIEW OF SYSTEMS: Valu.Nieves ] denotes positive finding; [  ] denotes negative finding  CARDIOVASCULAR:  [ ]  chest pain   [ ]  chest pressure   [ ]  palpitations   [ ]  orthopnea   [ ]  dyspnea on exertion   Valu.Nieves ] claudication left calf  [ ]  rest pain   [ ]  DVT   [ ]  phlebitis PULMONARY:   [ ]  productive cough   [ ]  asthma   [ ]  wheezing NEUROLOGIC:   [ ]  weakness  [ ]  paresthesias  [ ]  aphasia  [ ]  amaurosis  [ ]  dizziness HEMATOLOGIC:   [ ]  bleeding problems   [ ]  clotting disorders MUSCULOSKELETAL:  [ ]  joint pain   [ ]  joint swelling [ ]  leg swelling GASTROINTESTINAL: [ ]   blood in stool  [ ]   hematemesis GENITOURINARY:  [ ]   dysuria  [ ]   hematuria PSYCHIATRIC:  [ ]  history of major depression INTEGUMENTARY:  [ ]  rashes  [ ]  ulcers CONSTITUTIONAL:  [ ]  fever   [ ]   chills  PHYSICAL EXAM: Filed Vitals:   12/26/11 1454  BP: 132/67  Pulse: 84  Resp: 16  Height: 5' 10"  (1.778 m)  Weight: 238 lb 11.2 oz (108.274 kg)  SpO2: 100%   Body mass index is 34.25 kg/(m^2). GENERAL: The patient is a well-nourished male, in no acute distress. The vital signs are documented above. CARDIOVASCULAR: There is a regular rate and rhythm. I do not detect carotid bruits. He has palpable femoral pulses bilaterally. He has a palpable posterior tibial pulse the right foot. I cannot palpate pulses in the left foot. PULMONARY: There is good air exchange bilaterally without wheezing or rales. ABDOMEN: Soft and non-tender with normal pitched bowel sounds.  MUSCULOSKELETAL: There are no major deformities or cyanosis. NEUROLOGIC: No focal weakness or paresthesias are detected. SKIN: There are no ulcers or rashes noted. PSYCHIATRIC: The patient has a normal affect.  DATA:  I have reviewed his plain x-ray of the knee which was done by his orthopedic surgeon and shows diffuse calcific disease of the popliteal artery. I've reviewed his previous CT angiogram which was done in 2012 showed a short segment distal right superficial femoral artery plaque. There was two-vessel runoff on the right via the peroneal artery and posterior tibial arteries.  MEDICAL ISSUES:  Atherosclerosis of native arteries of the extremities with intermittent claudication This patient has disabling claudication of the left lower extremity. He feels that this is significantly limiting his lifestyle. He does not feel that he is able to dissipated and a structured walking program because of his symptoms. He would like to pursue revascularization. His been over a year since his most recent arteriogram and I've recommended that we proceed with arteriography with left lower extremity runoff to evaluate him for possible infrainguinal bypass. He has diffuse calcific disease based on a plain x-ray of his knee, so  I do not  think he would be a good candidate for endovascular approach. I have reviewed with the patient the indications for arteriography. In addition, I have reviewed the potential complications of arteriography including but not limited to: Bleeding, arterial injury, arterial thrombosis, dye action, renal insufficiency, or other unpredictable medical problems. I have explained to the patient that if  we find disease amenable to angioplasty we could potentially address this at the same time. I have discussed the potential complications of angioplasty and stenting, including but not limited to: Bleeding, arterial thrombosis, arterial injury, dissection, or the need for surgical intervention. We will make further recommendations pending the results of his arteriogram. We have had a long discussion about the option of nonoperative approach and a structured walking program however he feels strongly about pursuing revascularization.    [place] [facility] [place] [facility]per: [phone]

## 2011-12-26 NOTE — Assessment & Plan Note (Signed)
This patient has disabling claudication of the left lower extremity. He feels that this is significantly limiting his lifestyle. He does not feel that he is able to dissipated and a structured walking program because of his symptoms. He would like to pursue revascularization. His been over a year since his most recent arteriogram and I've recommended that we proceed with arteriography with left lower extremity runoff to evaluate him for possible infrainguinal bypass. He has diffuse calcific disease based on a plain x-ray of his knee, so  I do not think he would be a good candidate for endovascular approach. I have reviewed with the patient the indications for arteriography. In addition, I have reviewed the potential complications of arteriography including but not limited to: Bleeding, arterial injury, arterial thrombosis, dye action, renal insufficiency, or other unpredictable medical problems. I have explained to the patient that if we find disease amenable to angioplasty we could potentially address this at the same time. I have discussed the potential complications of angioplasty and stenting, including but not limited to: Bleeding, arterial thrombosis, arterial injury, dissection, or the need for surgical intervention. We will make further recommendations pending the results of his arteriogram. We have had a long discussion about the option of nonoperative approach and a structured walking program however he feels strongly about pursuing revascularization.

## 2011-12-27 ENCOUNTER — Other Ambulatory Visit: Payer: Self-pay

## 2011-12-27 ENCOUNTER — Encounter: Payer: Self-pay | Admitting: Vascular Surgery

## 2011-12-28 ENCOUNTER — Encounter (HOSPITAL_COMMUNITY): Payer: Self-pay | Admitting: Respiratory Therapy

## 2012-01-01 ENCOUNTER — Other Ambulatory Visit: Payer: Self-pay | Admitting: Physician Assistant

## 2012-01-07 ENCOUNTER — Telehealth: Payer: Self-pay | Admitting: Vascular Surgery

## 2012-01-07 ENCOUNTER — Encounter (HOSPITAL_COMMUNITY)
Admission: RE | Disposition: A | Discharge status: Home / Self Care | Payer: Self-pay | Source: Ambulatory Visit | Attending: Vascular Surgery

## 2012-01-07 ENCOUNTER — Ambulatory Visit (HOSPITAL_COMMUNITY)
Admission: RE | Admit: 2012-01-07 | Discharge: 2012-01-07 | Disposition: A | Discharge status: Home / Self Care | Payer: Federal, State, Local not specified - PPO | Source: Ambulatory Visit | Attending: Vascular Surgery | Admitting: Vascular Surgery

## 2012-01-07 DIAGNOSIS — E1149 Type 2 diabetes mellitus with other diabetic neurological complication: Secondary | ICD-10-CM | POA: Insufficient documentation

## 2012-01-07 DIAGNOSIS — I10 Essential (primary) hypertension: Secondary | ICD-10-CM | POA: Insufficient documentation

## 2012-01-07 DIAGNOSIS — Z79899 Other long term (current) drug therapy: Secondary | ICD-10-CM | POA: Insufficient documentation

## 2012-01-07 DIAGNOSIS — Z7902 Long term (current) use of antithrombotics/antiplatelets: Secondary | ICD-10-CM | POA: Insufficient documentation

## 2012-01-07 DIAGNOSIS — Z7982 Long term (current) use of aspirin: Secondary | ICD-10-CM | POA: Insufficient documentation

## 2012-01-07 DIAGNOSIS — Z87891 Personal history of nicotine dependence: Secondary | ICD-10-CM | POA: Insufficient documentation

## 2012-01-07 DIAGNOSIS — E1142 Type 2 diabetes mellitus with diabetic polyneuropathy: Secondary | ICD-10-CM | POA: Insufficient documentation

## 2012-01-07 DIAGNOSIS — E785 Hyperlipidemia, unspecified: Secondary | ICD-10-CM | POA: Insufficient documentation

## 2012-01-07 DIAGNOSIS — I70219 Atherosclerosis of native arteries of extremities with intermittent claudication, unspecified extremity: Secondary | ICD-10-CM | POA: Insufficient documentation

## 2012-01-07 DIAGNOSIS — I4891 Unspecified atrial fibrillation: Secondary | ICD-10-CM | POA: Insufficient documentation

## 2012-01-07 DIAGNOSIS — K219 Gastro-esophageal reflux disease without esophagitis: Secondary | ICD-10-CM | POA: Insufficient documentation

## 2012-01-07 HISTORY — PX: ABDOMINAL AORTAGRAM: SHX5454

## 2012-01-07 LAB — POCT I-STAT, CHEM 8
BUN: 21 mg/dL (ref 6–23)
Calcium, Ion: 1.21 mmol/L (ref 1.13–1.30)
Chloride: 102 mEq/L (ref 96–112)
HCT: 37 % — ABNORMAL LOW (ref 39.0–52.0)
Potassium: 4.8 mEq/L (ref 3.5–5.1)
Sodium: 138 mEq/L (ref 135–145)

## 2012-01-07 LAB — GLUCOSE, CAPILLARY: Glucose-Capillary: 141 mg/dL — ABNORMAL HIGH (ref 70–99)

## 2012-01-07 SURGERY — ABDOMINAL AORTAGRAM
Anesthesia: LOCAL

## 2012-01-07 MED ORDER — LIDOCAINE HCL (PF) 1 % IJ SOLN
INTRAMUSCULAR | Status: AC
  Filled 2012-01-07: qty 30

## 2012-01-07 MED ORDER — LABETALOL HCL 5 MG/ML IV SOLN: 10.0000 mg | INTRAVENOUS | Status: DC | PRN

## 2012-01-07 MED ORDER — MIDAZOLAM HCL 2 MG/2ML IJ SOLN
INTRAMUSCULAR | Status: AC
  Filled 2012-01-07: qty 2

## 2012-01-07 MED ORDER — ACETAMINOPHEN 325 MG PO TABS: 650.0000 mg | ORAL_TABLET | ORAL | Status: DC | PRN

## 2012-01-07 MED ORDER — SODIUM CHLORIDE 0.9 % IV SOLN: 1.0000 mL/kg/h | INTRAVENOUS | Status: DC

## 2012-01-07 MED ORDER — FENTANYL CITRATE 0.05 MG/ML IJ SOLN
INTRAMUSCULAR | Status: AC
  Filled 2012-01-07: qty 2

## 2012-01-07 MED ORDER — HEPARIN (PORCINE) IN NACL 2-0.9 UNIT/ML-% IJ SOLN
INTRAMUSCULAR | Status: AC
  Filled 2012-01-07: qty 1000

## 2012-01-07 MED ORDER — SODIUM CHLORIDE 0.9 % IV SOLN
INTRAVENOUS | Status: DC
  Administered 2012-01-07: 07:00:00 via INTRAVENOUS

## 2012-01-07 MED ORDER — ONDANSETRON HCL 4 MG/2ML IJ SOLN: 4.0000 mg | Freq: Four times a day (QID) | INTRAMUSCULAR | Status: DC | PRN

## 2012-01-07 NOTE — Op Note (Signed)
PATIENT: Edward [name]   MRN: [id number] DOB: [birth year]    DATE OF PROCEDURE: 01/07/2012  INDICATIONS: Edward [name] is a 71 y.o. male with disabling claudication of his left lower extremity. He wished to consider revascularization and is brought in for arteriography.  PROCEDURE:  1. Ultrasound-guided access to the right common femoral artery 2. Aortogram with bilateral iliac arteriogram 3. Selective catheterization of the left external iliac artery with left lower extremity runoff 4. Retrograde right femoral arteriogram with right lower extremity runoff  SURGEON: Judeth Cornfield. Scot Dock, MD, FACS  ANESTHESIA: local with sedation   EBL: minimal  TECHNIQUE: The patient was brought to the peripheral vascular lab and sedated with 1 mg of Versed and 50 mcg of fentanyl. Both groins were prepped and draped in usual sterile fashion. Under ultrasound guidance, and after the skin was anesthetized, the right common femoral artery was cannulated and a guidewire introduced into the infrarenal aorta under fluoroscopic control. A 5 French sheath was introduced over the wire. A pigtail catheter was positioned at the L1 vertebral body and flush aortogram obtained. The catheter was positioned above the aortic bifurcation and oblique iliac projection was obtained. The pigtail catheter was exchanged for a crossover catheter which was positioned into the proximal left common iliac artery. An angled Glidewire was advanced down into the external iliac artery and an end hole catheter exchanged for the crossover catheter. Selective left external iliac arteriogram was obtained with left lower extremity runoff. The catheter was then removed in a retrograde right femoral arteriogram was obtained with right lower extremity runoff. At the completion of the procedure the patient was transferred to the holding area for removal of the sheath.  FINDINGS:  1. Both renal arteries are patent with no significant renal artery  stenosis noted. 2. The infrarenal aorta, bilateral common iliac arteries, and bilateral external iliac arteries are widely patent. The previously placed stent in the proximal right common iliac artery is patent. The right hypogastric artery is patent. The left hypogastric artery is occluded. 3. On the left side, there is some eccentric plaque in the common femoral artery. There is mild diffuse disease of the superficial femoral artery. The popliteal artery is occluded on the left above the knee and there is reconstitution of the below knee popliteal artery. There are extensive collaterals. The anterior tibial and posterior tibial arteries are patent. The tibial peroneal trunk and proximal peroneal artery are patent. Peroneal artery on the left appears to be occluded distally. 4. On the right side the common femoral, superficial femoral, deep femoral, popliteal, posterior tibial and peroneal arteries are patent. The anterior tibial artery is occluded.  Clinical note: The patient wishes to discuss left femoral to below knee popliteal artery bypass grafting. I will arrange for an office visit and vein mapping of the left leg and we can discuss surgery at that time.  Deitra Mayo, MD, FACS [facility]  DATE OF DICTATION:   01/07/2012

## 2012-01-07 NOTE — Interval H&P Note (Signed)
History and Physical Interval Note:  01/07/2012 8:24 AM  Edward [name]  has presented today for surgery, with the diagnosis of PVD  The various methods of treatment have been discussed with the patient and family. After consideration of risks, benefits and other options for treatment, the patient has consented to  Procedure(s) (LRB) with comments: ABDOMINAL AORTAGRAM (N/A) as a surgical intervention .  The patient's history has been reviewed, patient examined, no change in status, stable for surgery.  I have reviewed the patient's chart and labs.  Questions were answered to the patient's satisfaction.     DICKSON,CHRISTOPHER S

## 2012-01-07 NOTE — Telephone Encounter (Signed)
Message copied by Berniece Salines on Mon Jan 07, 2012 11:59 AM ------      Message from: Alfonso Patten      Created: Mon Jan 07, 2012 11:28 AM      Regarding: FW: charge and f/u                   ----- Message -----         From: Angelia Mould, MD         Sent: 01/07/2012   9:43 AM           To: Patrici Ranks, Alfonso Patten, RN      Subject: charge and f/u                                           PROCEDURE:       1. Ultrasound-guided access to the right common femoral artery      2. Aortogram with bilateral iliac arteriogram      3. Selective catheterization of the left external iliac artery with left lower extremity runoff      4. Retrograde right femoral arteriogram with right lower extremity runoff            SURGEON: Judeth Cornfield. Scot Dock, MD, FACS            He needs an office visit in January with a vein map of his left greater saphenous vein and I will discuss possibly proceeding with left femoral to below knee popliteal artery bypass grafting at that time. Thank you. CSD

## 2012-01-07 NOTE — H&P (View-Only) (Signed)
Vascular and Vein Specialist of [facility]  Patient name: Edward [name] MRN: [id number] DOB: September 27, 1940 Sex: male  REASON FOR VISIT: left lower extremity claudication.  HPI: Edward [name] is a 71 y.o. male live known for many years. Most recently he underwent a right carotid endarterectomy in March of 2011. He has had stable claudication of his lower extremities. He has undergone successful revascularization on the right side by Dr. Sherren Mocha. He is now having significant claudication symptoms on the left. He experiences pain in the left calf which is brought on by ambulation and relieved with rest. This occurs at approximately 2 blocks. His symptoms have been gradually progressive. He's had no significant symptoms on the right side. He denies thyroid of claudication on the left side. He denies rest pain on either side. He has not had any nonhealing wounds.   He denies any history of stroke, TIAs, expressive or receptive aphasia, or amaurosis fugax.  Past Medical History  Diagnosis Date  . A-fib   . HTN (hypertension)   . Diabetes mellitus   . Dyslipidemia   . PVD (peripheral vascular disease)     s/p Right CEA March, 2011,   . Claudication   . Hyperlipidemia   . GERD (gastroesophageal reflux disease)   . Diabetic neuropathy     Family History  Problem Relation Age of Onset  . Coronary artery disease Father   . Hypertension Father   . Heart attack Mother   . Coronary artery disease Mother   . Hypertension Sister   . Anesthesia problems Neg Hx     SOCIAL HISTORY: History  Substance Use Topics  . Smoking status: Former Smoker    Types: Cigarettes    Quit date: 01/16/1979  . Smokeless tobacco: Never Used  . Alcohol Use: No     Comment: OCC.    Allergies  Allergen Reactions  . Lipitor (Atorvastatin Calcium) Other (See Comments)    Reaction unknown    Current Outpatient Prescriptions  Medication Sig Dispense Refill  . amLODipine (NORVASC) 5 MG tablet Take 5 mg  by mouth daily.      Marland Kitchen aspirin 81 MG tablet Take 81 mg by mouth daily.      . Coenzyme Q10 (COQ10) 200 MG CAPS Take 1 capsule by mouth daily.        . Exenatide (BYDUREON) 2 MG SUSR Inject into the skin once a week.      . furosemide (LASIX) 40 MG tablet TAKE 1 TABLET BY MOUTH EVERY DAY  30 tablet  1  . KLOR-CON M20 20 MEQ tablet TAKE 1 TABLET DAILY  30 tablet  1  . l-methylfolate-B6-B12 (METANX) 3-35-2 MG TABS Take 1 tablet by mouth daily.  60 tablet  5  . loratadine (CLARITIN) 10 MG tablet Take 10 mg by mouth daily.      Marland Kitchen losartan (COZAAR) 50 MG tablet Take 1 tablet (50 mg total) by mouth daily.  90 tablet  3  . Magnesium Oxide 250 MG TABS Take 250 mg by mouth daily.      . metFORMIN (GLUCOPHAGE) 1000 MG tablet Take 1,000 mg by mouth 2 (two) times daily with a meal. 1,500 mg in AM & 1,000 mg at PM.      . metoprolol (LOPRESSOR) 50 MG tablet Take 1 tablet (50 mg total) by mouth 2 (two) times daily.  60 tablet  6  . Multiple Vitamin (MULTIVITAMIN) tablet Take 1 tablet by mouth daily.        Marland Kitchen  omeprazole (PRILOSEC OTC) 20 MG tablet Take 20 mg by mouth daily.       . pravastatin (PRAVACHOL) 80 MG tablet Take 80 mg by mouth daily.        REVIEW OF SYSTEMS: Valu.Nieves ] denotes positive finding; [  ] denotes negative finding  CARDIOVASCULAR:  [ ]  chest pain   [ ]  chest pressure   [ ]  palpitations   [ ]  orthopnea   [ ]  dyspnea on exertion   Valu.Nieves ] claudication left calf  [ ]  rest pain   [ ]  DVT   [ ]  phlebitis PULMONARY:   [ ]  productive cough   [ ]  asthma   [ ]  wheezing NEUROLOGIC:   [ ]  weakness  [ ]  paresthesias  [ ]  aphasia  [ ]  amaurosis  [ ]  dizziness HEMATOLOGIC:   [ ]  bleeding problems   [ ]  clotting disorders MUSCULOSKELETAL:  [ ]  joint pain   [ ]  joint swelling [ ]  leg swelling GASTROINTESTINAL: [ ]   blood in stool  [ ]   hematemesis GENITOURINARY:  [ ]   dysuria  [ ]   hematuria PSYCHIATRIC:  [ ]  history of major depression INTEGUMENTARY:  [ ]  rashes  [ ]  ulcers CONSTITUTIONAL:  [ ]  fever   [ ]   chills  PHYSICAL EXAM: Filed Vitals:   12/26/11 1454  BP: 132/67  Pulse: 84  Resp: 16  Height: 5' 10"  (1.778 m)  Weight: 238 lb 11.2 oz (108.274 kg)  SpO2: 100%   Body mass index is 34.25 kg/(m^2). GENERAL: The patient is a well-nourished male, in no acute distress. The vital signs are documented above. CARDIOVASCULAR: There is a regular rate and rhythm. I do not detect carotid bruits. He has palpable femoral pulses bilaterally. He has a palpable posterior tibial pulse the right foot. I cannot palpate pulses in the left foot. PULMONARY: There is good air exchange bilaterally without wheezing or rales. ABDOMEN: Soft and non-tender with normal pitched bowel sounds.  MUSCULOSKELETAL: There are no major deformities or cyanosis. NEUROLOGIC: No focal weakness or paresthesias are detected. SKIN: There are no ulcers or rashes noted. PSYCHIATRIC: The patient has a normal affect.  DATA:  I have reviewed his plain x-ray of the knee which was done by his orthopedic surgeon and shows diffuse calcific disease of the popliteal artery. I've reviewed his previous CT angiogram which was done in 2012 showed a short segment distal right superficial femoral artery plaque. There was two-vessel runoff on the right via the peroneal artery and posterior tibial arteries.  MEDICAL ISSUES:  Atherosclerosis of native arteries of the extremities with intermittent claudication This patient has disabling claudication of the left lower extremity. He feels that this is significantly limiting his lifestyle. He does not feel that he is able to dissipated and a structured walking program because of his symptoms. He would like to pursue revascularization. His been over a year since his most recent arteriogram and I've recommended that we proceed with arteriography with left lower extremity runoff to evaluate him for possible infrainguinal bypass. He has diffuse calcific disease based on a plain x-ray of his knee, so  I do not  think he would be a good candidate for endovascular approach. I have reviewed with the patient the indications for arteriography. In addition, I have reviewed the potential complications of arteriography including but not limited to: Bleeding, arterial injury, arterial thrombosis, dye action, renal insufficiency, or other unpredictable medical problems. I have explained to the patient that if  we find disease amenable to angioplasty we could potentially address this at the same time. I have discussed the potential complications of angioplasty and stenting, including but not limited to: Bleeding, arterial thrombosis, arterial injury, dissection, or the need for surgical intervention. We will make further recommendations pending the results of his arteriogram. We have had a long discussion about the option of nonoperative approach and a structured walking program however he feels strongly about pursuing revascularization.    [place] [facility] [place] Beeper: [phone]

## 2012-01-19 ENCOUNTER — Other Ambulatory Visit: Payer: Self-pay | Admitting: Physician Assistant

## 2012-01-22 ENCOUNTER — Other Ambulatory Visit: Payer: Self-pay | Admitting: Physician Assistant

## 2012-01-22 NOTE — Telephone Encounter (Signed)
Chart pulled at nurses station pa pool [phone]

## 2012-01-24 ENCOUNTER — Other Ambulatory Visit: Payer: Self-pay | Admitting: Emergency Medicine

## 2012-01-25 ENCOUNTER — Other Ambulatory Visit: Payer: Self-pay | Admitting: Internal Medicine

## 2012-01-25 NOTE — Telephone Encounter (Signed)
We have advised patient x2 of need for office visit for refill on the Lasix/ please advise.

## 2012-01-28 ENCOUNTER — Encounter (INDEPENDENT_AMBULATORY_CARE_PROVIDER_SITE_OTHER): Payer: Federal, State, Local not specified - PPO

## 2012-01-28 ENCOUNTER — Encounter: Payer: Self-pay | Admitting: Cardiovascular Disease

## 2012-01-28 ENCOUNTER — Telehealth: Payer: Self-pay | Admitting: *Deleted

## 2012-01-28 ENCOUNTER — Ambulatory Visit (INDEPENDENT_AMBULATORY_CARE_PROVIDER_SITE_OTHER): Payer: Federal, State, Local not specified - PPO | Admitting: Internal Medicine

## 2012-01-28 ENCOUNTER — Encounter: Payer: Self-pay | Admitting: Internal Medicine

## 2012-01-28 ENCOUNTER — Ambulatory Visit (INDEPENDENT_AMBULATORY_CARE_PROVIDER_SITE_OTHER): Payer: Federal, State, Local not specified - PPO | Admitting: Cardiovascular Disease

## 2012-01-28 ENCOUNTER — Telehealth: Payer: Self-pay | Admitting: Radiology

## 2012-01-28 ENCOUNTER — Other Ambulatory Visit: Payer: Self-pay | Admitting: *Deleted

## 2012-01-28 VITALS — BP 128/90 | HR 82 | Ht 70.0 in | Wt 236.0 lb

## 2012-01-28 VITALS — BP 124/80 | HR 87 | Temp 98.0°F | Resp 16 | Ht 70.0 in | Wt 236.0 lb

## 2012-01-28 DIAGNOSIS — E782 Mixed hyperlipidemia: Secondary | ICD-10-CM

## 2012-01-28 DIAGNOSIS — I10 Essential (primary) hypertension: Secondary | ICD-10-CM

## 2012-01-28 DIAGNOSIS — I2510 Atherosclerotic heart disease of native coronary artery without angina pectoris: Secondary | ICD-10-CM

## 2012-01-28 DIAGNOSIS — E119 Type 2 diabetes mellitus without complications: Secondary | ICD-10-CM

## 2012-01-28 DIAGNOSIS — I4891 Unspecified atrial fibrillation: Secondary | ICD-10-CM

## 2012-01-28 DIAGNOSIS — I6529 Occlusion and stenosis of unspecified carotid artery: Secondary | ICD-10-CM

## 2012-01-28 DIAGNOSIS — I739 Peripheral vascular disease, unspecified: Secondary | ICD-10-CM

## 2012-01-28 LAB — COMPREHENSIVE METABOLIC PANEL
AST: 30 U/L (ref 0–37)
Alkaline Phosphatase: 83 U/L (ref 39–117)
BUN: 16 mg/dL (ref 6–23)
Creat: 1.12 mg/dL (ref 0.50–1.35)

## 2012-01-28 LAB — CBC
HCT: 36.1 % — ABNORMAL LOW (ref 39.0–52.0)
MCH: 21.5 pg — ABNORMAL LOW (ref 26.0–34.0)
MCHC: 31.9 g/dL (ref 30.0–36.0)
MCV: 67.4 fL — ABNORMAL LOW (ref 78.0–100.0)
RDW: 19.1 % — ABNORMAL HIGH (ref 11.5–15.5)

## 2012-01-28 LAB — LIPID PANEL
Cholesterol: 121 mg/dL (ref 0–200)
LDL Cholesterol: 59 mg/dL (ref 0–99)
Total CHOL/HDL Ratio: 2.9 Ratio
Triglycerides: 102 mg/dL (ref <150)
VLDL: 20 mg/dL (ref 0–40)

## 2012-01-28 MED ORDER — METOPROLOL TARTRATE 50 MG PO TABS: 50.0000 mg | ORAL_TABLET | Freq: Two times a day (BID) | ORAL | Status: DC

## 2012-01-28 MED ORDER — FUROSEMIDE 40 MG PO TABS: 40.0000 mg | ORAL_TABLET | Freq: Every day | ORAL | Status: DC

## 2012-01-28 MED ORDER — AMLODIPINE BESYLATE 5 MG PO TABS: 5.0000 mg | ORAL_TABLET | Freq: Every day | ORAL | Status: DC

## 2012-01-28 MED ORDER — PRAVASTATIN SODIUM 80 MG PO TABS: 80.0000 mg | ORAL_TABLET | Freq: Every day | ORAL | Status: DC

## 2012-01-28 MED ORDER — POTASSIUM CHLORIDE CRYS ER 20 MEQ PO TBCR: 20.0000 meq | EXTENDED_RELEASE_TABLET | Freq: Every day | ORAL | Status: DC

## 2012-01-28 NOTE — Telephone Encounter (Signed)
24 hr holter monitor placed on Pt 01/28/12  TK

## 2012-01-28 NOTE — Telephone Encounter (Signed)
Patient was here today to see Dr Elder Cyphers.

## 2012-01-28 NOTE — Patient Instructions (Signed)
DASH Diet The DASH diet stands for "Dietary Approaches to Stop Hypertension." It is a healthy eating plan that has been shown to reduce high blood pressure (hypertension) in as little as 14 days, while also possibly providing other significant health benefits. These other health benefits include reducing the risk of breast cancer after menopause and reducing the risk of type 2 diabetes, heart disease, colon cancer, and stroke. Health benefits also include weight loss and slowing kidney failure in patients with chronic kidney disease.  DIET GUIDELINES  Limit salt (sodium). Your diet should contain less than 1500 mg of sodium daily.  Limit refined or processed carbohydrates. Your diet should include mostly whole grains. Desserts and added sugars should be used sparingly.  Include small amounts of heart-healthy fats. These types of fats include nuts, oils, and tub margarine. Limit saturated and trans fats. These fats have been shown to be harmful in the body. CHOOSING FOODS  The following food groups are based on a 2000 calorie diet. See your Registered Dietitian for individual calorie needs. Grains and Grain Products (6 to 8 servings daily)  Eat More Often: Whole-wheat bread, brown rice, whole-grain or wheat pasta, quinoa, popcorn without added fat or salt (air popped).  Eat Less Often: White bread, white pasta, white rice, cornbread. Vegetables (4 to 5 servings daily)  Eat More Often: Fresh, frozen, and canned vegetables. Vegetables may be raw, steamed, roasted, or grilled with a minimal amount of fat.  Eat Less Often/Avoid: Creamed or fried vegetables. Vegetables in a cheese sauce. Fruit (4 to 5 servings daily)  Eat More Often: All fresh, canned (in natural juice), or frozen fruits. Dried fruits without added sugar. One hundred percent fruit juice ( cup [237 mL] daily).  Eat Less Often: Dried fruits with added sugar. Canned fruit in light or heavy syrup. [employer], Fish, and Poultry (2  servings or less daily. One serving is 3 to 4 oz [85-114 g]).  Eat More Often: Ninety percent or leaner ground beef, tenderloin, sirloin. Round cuts of beef, chicken breast, [place] breast. All fish. Grill, bake, or broil your meat. Nothing should be fried.  Eat Less Often/Avoid: Fatty cuts of meat, [place], or chicken leg, thigh, or wing. Fried cuts of meat or fish. Dairy (2 to 3 servings)  Eat More Often: Low-fat or fat-free milk, low-fat plain or light yogurt, reduced-fat or part-skim cheese.  Eat Less Often/Avoid: Milk (whole, 2%).Whole milk yogurt. Full-fat cheeses. Nuts, Seeds, and Legumes (4 to 5 servings per week)  Eat More Often: All without added salt.  Eat Less Often/Avoid: Salted nuts and seeds, canned beans with added salt. Fats and Sweets (limited)  Eat More Often: Vegetable oils, tub margarines without trans fats, sugar-free gelatin. Mayonnaise and salad dressings.  Eat Less Often/Avoid: Coconut oils, palm oils, butter, stick margarine, cream, half and half, cookies, candy, pie. FOR MORE INFORMATION The Dash Diet Eating Plan: www.dashdiet.org Document Released: 12/21/2010 Document Revised: 03/26/2011 Document Reviewed: 12/21/2010 [facility] Patient Information 2013 [place].

## 2012-01-28 NOTE — Patient Instructions (Addendum)
Your physician has requested that you have a lexiscan myoview.  Please follow instruction sheet, as given.   Your physician has recommended that you wear a 24 hour holter monitor. Edward [name] will place it today Holter monitors are medical devices that record the heart's electrical activity. Doctors most often use these monitors to diagnose arrhythmias. Arrhythmias are problems with the speed or rhythm of the heartbeat. The monitor is a small, portable device. You can wear one while you do your normal daily activities. This is usually used to diagnose what is causing palpitations/syncope (passing out).   Your physician wants you to follow-up in: 6 months  You will receive a reminder letter in the mail two months in advance. If you don't receive a letter, please call our office to schedule the follow-up appointment.

## 2012-01-28 NOTE — Progress Notes (Signed)
  Subjective:    Patient ID: Edward [name], male    DOB: 08/30/1940, 72 y.o.   MRN: [id number]  HPI Doing well. Dr. Dwyane Dee is his diabetes specialist and going well. Dr. Cathie Olden and Dr. Caryl Comes are his cardiologists. Hx of CABG, pacemaker, and pvd, and afib--all txed and doing well.   Review of Systems     Objective:   Physical Exam  Vitals reviewed. Constitutional: He is oriented to person, place, and time. He appears well-nourished. No distress.  Cardiovascular: Normal rate, regular rhythm and normal heart sounds.   Pulmonary/Chest: Effort normal and breath sounds normal.  Neurological: He is alert and oriented to person, place, and time. He has normal strength. A sensory deficit is present. Coordination normal.       Mild neuropathy     Results for orders placed in visit on 01/28/12  POCT GLYCOSYLATED HEMOGLOBIN (HGB A1C)      Component Value Range   Hemoglobin A1C 6.7    GLUCOSE, POCT (MANUAL RESULT ENTRY)      Component Value Range   POC Glucose 158 (*) 70 - 99 mg/dl        Assessment & Plan:  Stable all issues/RF meds 6 mos CPE soon

## 2012-01-28 NOTE — Assessment & Plan Note (Signed)
His rhythm is regular. I suspect that he has remained in sinus rhythm.

## 2012-01-28 NOTE — Progress Notes (Signed)
Edward [name] Date of Birth  1940-12-29 [place]  [address]    [place]   [place], [place]  [address]    [place], [place]  [address] [phone]  Fax  [phone]  [phone]  Fax [phone]  Problem list:  1. Atrial fibrillation- - s/p MAZE procedure  - no AF recently 2. Hypertension 3. Diabetes mellitus 4. Dyslipidemia 5. Peripheral vascular disease:-Status post right carotid endarterectomy and status post PTA by Dr. Sherren Mocha 6. History of GI bleed-currently not on antiplatelet agents or anticoagulants-  7. Anemia 8. History of TIA 9. 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 gentleman with a history as noted above. He had coronary artery bypass grafting in May of this year. He also had a Maze procedure.  Current Outpatient Prescriptions on File Prior to Visit  Medication Sig Dispense Refill  . amLODipine (NORVASC) 5 MG tablet Take 5 mg by mouth daily.      Marland Kitchen aspirin 81 MG tablet Take 81 mg by mouth daily.      . Coenzyme Q10 (COQ10) 200 MG CAPS Take 1 capsule by mouth daily.        . Exenatide (BYDUREON) 2 MG SUSR Inject into the skin once a week.      . furosemide (LASIX) 40 MG tablet Take 1 tablet (40 mg total) by mouth daily. Needs office visit (second notice)  15 tablet  0  . l-methylfolate-B6-B12 (METANX) 3-35-2 MG TABS Take 1 tablet by mouth daily.  60 tablet  5  . loratadine (CLARITIN) 10 MG tablet Take 10 mg by mouth daily.      Marland Kitchen losartan (COZAAR) 50 MG tablet Take 1 tablet (50 mg total) by mouth daily.  90 tablet  3  . Magnesium Oxide 250 MG TABS Take 250 mg by mouth daily.      . metFORMIN (GLUCOPHAGE) 1000 MG tablet Take 1,000 mg by mouth 2 (two) times daily with a meal. 1,500 mg in AM & 1,000 mg at PM.      . metoprolol (LOPRESSOR) 50 MG tablet Take 1 tablet (50 mg total) by mouth 2 (two) times daily.  60 tablet  6  . Multiple Vitamin  (MULTIVITAMIN) tablet Take 1 tablet by mouth daily.        Marland Kitchen omeprazole (PRILOSEC OTC) 20 MG tablet Take 20 mg by mouth daily.       . potassium chloride SA (K-DUR,KLOR-CON) 20 MEQ tablet Take 20 mEq by mouth daily.      . pravastatin (PRAVACHOL) 80 MG tablet Take 80 mg by mouth daily.        Allergies  Allergen Reactions  . Lipitor (Atorvastatin Calcium) Other (See Comments)    Reaction unknown    Past Medical History  Diagnosis Date  . A-fib   . HTN (hypertension)   . Diabetes mellitus   . Dyslipidemia   . PVD (peripheral vascular disease)     s/p Right CEA March, 2011,   . Claudication   . Hyperlipidemia   . GERD (gastroesophageal reflux disease)   . Diabetic neuropathy     Past Surgical History  Procedure Date  . Carotid endarterectomy     RIGHT  . Cholecystectomy   . Tonsillectomy and adenoidectomy   . Knee surgery   . Coronary artery bypass graft 06/14/2011    Procedure:  CORONARY ARTERY BYPASS GRAFTING (CABG);  Surgeon: Gaye Pollack, MD;  Location: [facility];  Service: Open Heart Surgery;  Laterality: N/A;  Coronary Artery Bypass Graft on pump times six;  utilizing internal mammary artery and right greater saphenous vein harvested endoscopically.  . Maze 06/14/2011    Procedure: MAZE;  Surgeon: Gaye Pollack, MD;  Location: [facility];  Service: Open Heart Surgery;  Laterality: N/A;  Ligate left atrial appendage    History  Smoking status  . Former Smoker  . Types: Cigarettes  . Quit date: 01/16/1979  Smokeless tobacco  . Never Used    History  Alcohol Use No    Comment: OCC.    Family History  Problem Relation Age of Onset  . Coronary artery disease Father   . Hypertension Father   . Heart attack Mother   . Coronary artery disease Mother   . Hypertension Sister   . Anesthesia problems Neg Hx     Reviw of Systems:  Reviewed in the HPI.  All other systems are negative.  Physical Exam: BP 128/90  Pulse 82  Ht 5' 10"  (1.778 m)  Wt 236 lb (107.049 kg)   BMI 33.86 kg/m2  SpO2 95% The patient is alert and oriented x 3.  The mood and affect are normal.   Skin: warm and dry.  Color is normal.    HEENT:   Normocephalic/atraumatic. He has a right carotid endarterectomy scar. His carotids are 2+. No JVD. His neck is supple. The membranes are moist.  Lungs: Lungs are clear to   Heart: Regular rate. No murmurs.  His sternotomy scar is healing well. His thoracostomy sites are healing well.  Abdomen: Good bowel sounds. No hepatosplenomegaly. His abdomen is nontender.  Extremities:  No clubbing cyanosis or edema.  Neuro:  Neuro exam is nonfocal.    ECG:   Assessment / Plan:

## 2012-01-28 NOTE — Assessment & Plan Note (Addendum)
He has a history of coronary disease but has done well following his bypass surgery and maze procedure.  He has requested that he get a Press photographer for his [place] physical. 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  They have also requested a 24-hour Holter monitor.

## 2012-01-28 NOTE — Telephone Encounter (Signed)
Letter typed for Dr Elder Cyphers

## 2012-01-29 ENCOUNTER — Ambulatory Visit (HOSPITAL_COMMUNITY): Payer: Federal, State, Local not specified - PPO | Attending: Cardiology | Admitting: Radiology

## 2012-01-29 ENCOUNTER — Encounter: Payer: Self-pay | Admitting: Internal Medicine

## 2012-01-29 ENCOUNTER — Ambulatory Visit (INDEPENDENT_AMBULATORY_CARE_PROVIDER_SITE_OTHER): Payer: Federal, State, Local not specified - PPO | Admitting: Internal Medicine

## 2012-01-29 VITALS — BP 175/95 | Ht 70.0 in | Wt 238.0 lb

## 2012-01-29 VITALS — BP 148/82 | HR 93 | Ht 70.0 in | Wt 248.0 lb

## 2012-01-29 DIAGNOSIS — I739 Peripheral vascular disease, unspecified: Secondary | ICD-10-CM | POA: Insufficient documentation

## 2012-01-29 DIAGNOSIS — I498 Other specified cardiac arrhythmias: Secondary | ICD-10-CM

## 2012-01-29 DIAGNOSIS — Z950 Presence of cardiac pacemaker: Secondary | ICD-10-CM

## 2012-01-29 DIAGNOSIS — E119 Type 2 diabetes mellitus without complications: Secondary | ICD-10-CM | POA: Insufficient documentation

## 2012-01-29 DIAGNOSIS — I4891 Unspecified atrial fibrillation: Secondary | ICD-10-CM

## 2012-01-29 DIAGNOSIS — I2510 Atherosclerotic heart disease of native coronary artery without angina pectoris: Secondary | ICD-10-CM

## 2012-01-29 DIAGNOSIS — I10 Essential (primary) hypertension: Secondary | ICD-10-CM | POA: Insufficient documentation

## 2012-01-29 DIAGNOSIS — I4949 Other premature depolarization: Secondary | ICD-10-CM

## 2012-01-29 DIAGNOSIS — I447 Left bundle-branch block, unspecified: Secondary | ICD-10-CM | POA: Insufficient documentation

## 2012-01-29 LAB — PACEMAKER DEVICE OBSERVATION
AL IMPEDENCE PM: 516 Ohm
ATRIAL PACING PM: 100
BATTERY VOLTAGE: 2.78 V
RV LEAD IMPEDENCE PM: 692 Ohm

## 2012-01-29 MED ORDER — TECHNETIUM TC 99M SESTAMIBI GENERIC - CARDIOLITE
10.0000 | Freq: Once | INTRAVENOUS | Status: AC | PRN
  Administered 2012-01-29: 10 via INTRAVENOUS

## 2012-01-29 MED ORDER — TECHNETIUM TC 99M SESTAMIBI GENERIC - CARDIOLITE
30.0000 | Freq: Once | INTRAVENOUS | Status: AC | PRN
  Administered 2012-01-29: 30 via INTRAVENOUS

## 2012-01-29 MED ORDER — REGADENOSON 0.4 MG/5ML IV SOLN
0.4000 mg | Freq: Once | INTRAVENOUS | Status: AC
  Administered 2012-01-29: 0.4 mg via INTRAVENOUS

## 2012-01-29 NOTE — Assessment & Plan Note (Signed)
Summary junctional rhythm is suppressed by overdrive pacing. He is largely asymptomatic at this point

## 2012-01-29 NOTE — Progress Notes (Signed)
[facility] [address] [place], [place] [address] [phone]    Cardiology Nuclear Med Study  Edward [name] is a 72 y.o. male     MRN : [id number]     DOB: [birth year]  Procedure Date: 01/29/2012  Nuclear Med Background Indication for Stress Test:  Evaluation for Ischemia, Graft Patency and FAA Physical History:  5/12 Echo:EF=65%;Cardioversion for AFib;10/12 MPS: EF=48%,no ischemia with small fixed apical defect;06/10/11 Heart Catheterization: EF=40-50%,3 vessel disease>CABG with Maze procedure;6/13 Pacemaker Cardiac Risk Factors: Carotid Disease, Claudication, Family History - CAD, History of Smoking, Hypertension, LBBB, Lipids, NIDDM, PVD and TIA  Symptoms:  No symptoms   Nuclear Pre-Procedure Caffeine/Decaff Intake:  None > 12 hrs NPO After: 11:30pm   Lungs:  clear O2 Sat: 97% on room air. IV 0.9% NS with Angio Cath:  20g  IV Site: R Antecubital x 1, tolerated well IV Started by:  Irven Baltimore, RN  Chest Size (in):  48 Cup Size: n/a  Height: 5' 10"  (1.778 m)  Weight:  238 lb (107.956 kg)  BMI:  Body mass index is 34.15 kg/(m^2). Tech Comments:  Held metoprolol , and metformin this am.    Nuclear Med Study 1 or 2 day study: 1 day  Stress Test Type:  Lexiscan  Reading MD: Kirk Ruths, MD  Order Authorizing Provider:  Mertie Moores, MD  Resting Radionuclide: Technetium 13mSestamibi  Resting Radionuclide Dose: 11.0 mCi   Stress Radionuclide:  Technetium 964mestamibi  Stress Radionuclide Dose: 33.0 mCi           Stress Protocol Rest HR: 92 Stress HR: 94  Rest BP: 175/95 Stress BP: 185/102  Exercise Time (min): n/a METS: n/a   Predicted Max HR: 151 bpm % Max HR: 62.25 bpm Rate Pressure Product: 17390    Dose of Adenosine (mg):  n/a Dose of Lexiscan: 0.4 mg  Dose of Atropine (mg): n/a Dose of Dobutamine: n/a mcg/kg/min (at max HR)  Stress Test Technologist: CyMatilde HaymakerRN  Nuclear Technologist:  StCharlton AmorCNMT      Rest Procedure:  Myocardial perfusion imaging was performed at rest 45 minutes following the intravenous administration of Technetium 9939mstamibi. Rest ECG: Accelerated junctional rhythm (no Pwaves identified), incomplete LBBB, inferior and lateral TWI.  Stress Procedure:  The patient received IV Lexiscan 0.4 mg over 15-seconds.  Technetium 60m66mtamibi injected at 30-seconds.  Quantitative spect images were obtained after a 45 minute delay. Stress ECG: No significant ST segment change suggestive of ischemia.  QPS Raw Data Images:  Acquisition technically good; normal left ventricular size. Stress Images:  There is decreased uptake in the apex. Rest Images:  There is decreased uptake in the apex. Subtraction (SDS):  No evidence of ischemia. Transient Ischemic Dilatation (Normal <1.22):  1.04 Lung/Heart Ratio (Normal <0.45):  0.43  Quantitative Gated Spect Images QGS EDV:  117 ml QGS ESV:  48 ml  Impression Exercise Capacity:  Lexiscan with no exercise. BP Response:  Normal blood pressure response. Clinical Symptoms:  No chest pain or dyspnea. ECG Impression:  No significant ST segment change suggestive of ischemia. Comparison with Prior Nuclear Study: No images to compare  Overall Impression:  Low risk stress nuclear study with a small, severe, fixed apical defect consistent with small prior infarct; no ischemia.  LV Ejection Fraction: 59%.  LV Wall Motion:  NL LV Function; NL Wall Motion  BriaKirk Ruths

## 2012-01-29 NOTE — Assessment & Plan Note (Signed)
The patient's device was interrogated.  The information was reviewed. No changes were made in the programming.    

## 2012-01-29 NOTE — Assessment & Plan Note (Signed)
There is no evidence of atrial fibrillation. There was a short from of nonsustained atrial tachycardia. This could have been sinus tachycardia

## 2012-01-29 NOTE — Patient Instructions (Signed)
Your physician wants you to follow-up in: June 2014 with Dr. Caryl Comes. You will receive a reminder letter in the mail two months in advance. If you don't receive a letter, please call our office to schedule the follow-up appointment.  Your physician recommends that you continue on your current medications as directed. Please refer to the Current Medication list given to you today.

## 2012-01-29 NOTE — Progress Notes (Signed)
Patient Care Team: [name], MD as PCP - General (Internal Medicine) Elayne Snare, MD as Attending Physician (Endocrinology) Angelia Mould, MD as Attending Physician (Vascular Surgery) Sherren Mocha, MD as Attending Physician (Cardiology) [name], MD as Attending Physician (Cardiology) Gaye Pollack, MD as Attending Physician (Cardiothoracic Surgery) Deboraha Sprang, MD as Attending Physician (Cardiology)   HPI  Edward [name] is a 72 y.o. male Seen following pacemaker implantation for sinus node dysfunction and rapid junctional rhythm following bypass and Maze  Because of problems with exercise tolerance e activated rate response in the hope of overdriving his junctional rhythm  He is with this change vastly improved.  He comes in with no complaints of chest pain or shortness of breath. 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    Past Medical History  Diagnosis Date  . A-fib   . HTN (hypertension)   . Diabetes mellitus   . Dyslipidemia   . PVD (peripheral vascular disease)     s/p Right CEA March, 2011,   . Claudication   . Hyperlipidemia   . GERD (gastroesophageal reflux disease)   . Diabetic neuropathy     Past Surgical History  Procedure Date  . Carotid endarterectomy     RIGHT  . Cholecystectomy   . Tonsillectomy and adenoidectomy   . Knee surgery   . Coronary artery bypass graft 06/14/2011    Procedure: CORONARY ARTERY BYPASS GRAFTING (CABG);  Surgeon: Gaye Pollack, MD;  Location: [facility];  Service: Open Heart Surgery;  Laterality: N/A;  Coronary Artery Bypass Graft on pump times six;  utilizing internal mammary artery and right greater saphenous vein harvested endoscopically.  . Maze 06/14/2011    Procedure: MAZE;  Surgeon: Gaye Pollack, MD;  Location: [facility];  Service: Open Heart Surgery;  Laterality: N/A;  Ligate left atrial appendage    Current Outpatient Prescriptions  Medication Sig Dispense Refill  . amLODipine (NORVASC) 5 MG tablet Take 1  tablet (5 mg total) by mouth daily.  30 tablet  5  . aspirin 81 MG tablet Take 81 mg by mouth daily.      . Coenzyme Q10 (COQ10) 200 MG CAPS Take 1 capsule by mouth daily.        . Exenatide (BYDUREON) 2 MG SUSR Inject into the skin once a week.      . furosemide (LASIX) 40 MG tablet Take 1 tablet (40 mg total) by mouth daily.  30 tablet  6  . l-methylfolate-B6-B12 (METANX) 3-35-2 MG TABS Take 1 tablet by mouth daily.  60 tablet  5  . loratadine (CLARITIN) 10 MG tablet Take 10 mg by mouth daily.      Marland Kitchen losartan (COZAAR) 50 MG tablet Take 1 tablet (50 mg total) by mouth daily.  90 tablet  3  . Magnesium Oxide 250 MG TABS Take 250 mg by mouth daily.      . metFORMIN (GLUCOPHAGE) 1000 MG tablet Take 1,000 mg by mouth 2 (two) times daily with a meal. 1,500 mg in AM & 1,000 mg at PM.      . metoprolol (LOPRESSOR) 50 MG tablet Take 1 tablet (50 mg total) by mouth 2 (two) times daily.  60 tablet  6  . Multiple Vitamin (MULTIVITAMIN) tablet Take 1 tablet by mouth daily.        Marland Kitchen omeprazole (PRILOSEC OTC) 20 MG tablet Take 20 mg by mouth daily.       . potassium chloride SA (K-DUR,KLOR-CON)  20 MEQ tablet Take 1 tablet (20 mEq total) by mouth daily.  30 tablet  5  . pravastatin (PRAVACHOL) 80 MG tablet Take 1 tablet (80 mg total) by mouth daily.  30 tablet  5    Allergies  Allergen Reactions  . Lipitor (Atorvastatin Calcium) Other (See Comments)    Reaction unknown    Review of Systems negative except from HPI and PMH  Physical Exam BP 148/82  Pulse 93  Ht 5' 10"  (1.778 m)  Wt 248 lb (112.492 kg)  BMI 35.58 kg/m2  SpO2 98% Well developed and well nourished in no acute distress HENT normal E scleral and icterus clear Neck Supple JVP flat; carotids brisk and full Clear to ausculation   Device pocket well healed; without hematoma or erythema  Regular rate and rhythm, no murmurs gallops or rub Soft with active bowel sounds No clubbing cyanosis none Edema Alert and oriented, grossly normal  motor and sensory function Skin Warm and Dry    Assessment and  Plan

## 2012-02-01 ENCOUNTER — Encounter: Payer: Self-pay | Admitting: *Deleted

## 2012-02-01 ENCOUNTER — Other Ambulatory Visit: Payer: Self-pay | Admitting: *Deleted

## 2012-02-01 DIAGNOSIS — Z01810 Encounter for preprocedural cardiovascular examination: Secondary | ICD-10-CM

## 2012-02-01 DIAGNOSIS — I739 Peripheral vascular disease, unspecified: Secondary | ICD-10-CM

## 2012-02-04 ENCOUNTER — Encounter: Payer: Self-pay | Admitting: Radiology

## 2012-02-05 ENCOUNTER — Ambulatory Visit: Payer: Federal, State, Local not specified - PPO | Admitting: Emergency Medicine

## 2012-02-06 ENCOUNTER — Ambulatory Visit: Payer: Federal, State, Local not specified - PPO | Admitting: Vascular Surgery

## 2012-02-06 ENCOUNTER — Other Ambulatory Visit: Payer: Federal, State, Local not specified - PPO

## 2012-02-11 ENCOUNTER — Ambulatory Visit (INDEPENDENT_AMBULATORY_CARE_PROVIDER_SITE_OTHER): Payer: Self-pay | Admitting: Emergency Medicine

## 2012-02-11 DIAGNOSIS — Z0289 Encounter for other administrative examinations: Secondary | ICD-10-CM

## 2012-02-11 DIAGNOSIS — Z0000 Encounter for general adult medical examination without abnormal findings: Secondary | ICD-10-CM

## 2012-02-11 NOTE — Progress Notes (Signed)
  Subjective:    Patient ID: Edward [name], male    DOB: 1940/02/25, 72 y.o.   MRN: [id number]  HPI patient is here for an FA exam. He is a complicated history of coronary artery disease status post bypass . He has an implanted pacemaker. His course has been complicated with long-standing intermittent atrial fibrillation. He has diabetes mellitus and is followed by Dr. Elder Cyphers.    Review of Systems     Objective:   Physical Exam patient is alert and cooperative in no distress. His neck was supple. He has a well-healed sternotomy scar with multiple upper abdominal scars. His chest was clear to auscultation and percussion. There is a pacemaker scar and pacemaker present in the left upper anterior chest. His cardiac exam is regular rate and rhythm. His abdomen is soft nontender. Extremities are without edema pulses 2+ and symmetrical.        Assessment & Plan:  The FAA form was completed and sent to the [facility] for their evaluation.

## 2012-02-12 ENCOUNTER — Encounter: Payer: Self-pay | Admitting: Vascular Surgery

## 2012-02-13 ENCOUNTER — Other Ambulatory Visit: Payer: Federal, State, Local not specified - PPO

## 2012-02-13 ENCOUNTER — Ambulatory Visit: Payer: Self-pay | Admitting: Neurosurgery

## 2012-02-13 ENCOUNTER — Encounter (INDEPENDENT_AMBULATORY_CARE_PROVIDER_SITE_OTHER): Payer: Federal, State, Local not specified - PPO | Admitting: *Deleted

## 2012-02-13 ENCOUNTER — Other Ambulatory Visit (INDEPENDENT_AMBULATORY_CARE_PROVIDER_SITE_OTHER): Payer: Federal, State, Local not specified - PPO | Admitting: *Deleted

## 2012-02-13 ENCOUNTER — Ambulatory Visit (INDEPENDENT_AMBULATORY_CARE_PROVIDER_SITE_OTHER): Payer: Federal, State, Local not specified - PPO | Admitting: Vascular Surgery

## 2012-02-13 ENCOUNTER — Encounter: Payer: Self-pay | Admitting: Vascular Surgery

## 2012-02-13 VITALS — BP 154/89 | HR 83 | Ht 70.0 in | Wt 236.0 lb

## 2012-02-13 DIAGNOSIS — I6529 Occlusion and stenosis of unspecified carotid artery: Secondary | ICD-10-CM

## 2012-02-13 DIAGNOSIS — I70219 Atherosclerosis of native arteries of extremities with intermittent claudication, unspecified extremity: Secondary | ICD-10-CM

## 2012-02-13 DIAGNOSIS — I739 Peripheral vascular disease, unspecified: Secondary | ICD-10-CM

## 2012-02-13 DIAGNOSIS — Z48812 Encounter for surgical aftercare following surgery on the circulatory system: Secondary | ICD-10-CM

## 2012-02-13 DIAGNOSIS — Z01810 Encounter for preprocedural cardiovascular examination: Secondary | ICD-10-CM

## 2012-02-13 NOTE — Assessment & Plan Note (Signed)
This patient has a widely patent right carotid endarterectomy site with no evidence of carotid stenosis on the left. I have ordered a fall carotid duplex scan in 1 year. The meantime he knows to continue taking his aspirin.

## 2012-02-13 NOTE — Progress Notes (Signed)
[facility]  Patient name: Edward [name] MRN: [id number] DOB: 1940/02/24 Sex: male  REASON FOR ADMISSION: progressive disabling claudication of the left lower extremity  HPI: Edward [name] is a 72 y.o. male who I been following with carotid disease and peripheral vascular disease. He underwent a right carotid endarterectomy in March 2011. He has had stable claudication of both lower extremities. However he has developed progressive disabling claudication of the left lower extremity. He experiences pain in the left calf which is brought on by ambulation and relieved with rest. This occurs at less than 2 blocks. He denies rest pain or nonhealing ulcers. He has minimal symptoms on the right side.  He has undergone an arteriogram which shows diffuse superficial femoral artery and popliteal artery occlusive disease. He wishes to pursue revascularization as he was not a candidate for an endovascular approach given the diffuse popliteal disease.  Past Medical History  Diagnosis Date  . A-fib   . HTN (hypertension)   . Diabetes mellitus   . Dyslipidemia   . PVD (peripheral vascular disease)     s/p Right CEA March, 2011,   . Claudication   . Hyperlipidemia   . GERD (gastroesophageal reflux disease)   . Diabetic neuropathy     Family History  Problem Relation Age of Onset  . Coronary artery disease Father   . Hypertension Father   . Heart attack Mother   . Coronary artery disease Mother   . Hypertension Sister   . Anesthesia problems Neg Hx     SOCIAL HISTORY: History  Substance Use Topics  . Smoking status: Former Smoker    Types: Cigarettes    Quit date: 01/16/1979  . Smokeless tobacco: Never Used  . Alcohol Use: No     Comment: OCC.    Allergies  Allergen Reactions  . Lipitor (Atorvastatin Calcium) Other (See Comments)    Reaction unknown    Current Outpatient Prescriptions  Medication Sig Dispense Refill  . amLODipine (NORVASC) 5 MG  tablet Take 1 tablet (5 mg total) by mouth daily.  30 tablet  5  . aspirin 81 MG tablet Take 81 mg by mouth daily.      . Coenzyme Q10 (COQ10) 200 MG CAPS Take 1 capsule by mouth daily.        . Exenatide (BYDUREON) 2 MG SUSR Inject into the skin once a week.      . furosemide (LASIX) 40 MG tablet Take 1 tablet (40 mg total) by mouth daily.  30 tablet  6  . l-methylfolate-B6-B12 (METANX) 3-35-2 MG TABS Take 1 tablet by mouth daily.  60 tablet  5  . loratadine (CLARITIN) 10 MG tablet Take 10 mg by mouth daily.      Marland Kitchen losartan (COZAAR) 50 MG tablet Take 1 tablet (50 mg total) by mouth daily.  90 tablet  3  . Magnesium Oxide 250 MG TABS Take 500 mg by mouth daily.       . metFORMIN (GLUCOPHAGE) 1000 MG tablet Take 1,000 mg by mouth 2 (two) times daily with a meal. 1,500 mg in AM & 1,000 mg at PM.      . metoprolol (LOPRESSOR) 50 MG tablet Take 1 tablet (50 mg total) by mouth 2 (two) times daily.  60 tablet  6  . Multiple Vitamin (MULTIVITAMIN) tablet Take 1 tablet by mouth daily.        Marland Kitchen omeprazole (PRILOSEC OTC) 20 MG tablet Take 20 mg by mouth daily.       Marland Kitchen  Phenylephrine HCl (SUDAFED PE MAXIMUM STRENGTH PO) Take by mouth as needed.      . potassium chloride SA (K-DUR,KLOR-CON) 20 MEQ tablet Take 1 tablet (20 mEq total) by mouth daily.  30 tablet  5  . pravastatin (PRAVACHOL) 80 MG tablet Take 1 tablet (80 mg total) by mouth daily.  30 tablet  5    REVIEW OF SYSTEMS: Valu.Nieves ] denotes positive finding; [  ] denotes negative finding CARDIOVASCULAR:  [ ]  chest pain   [ ]  chest pressure   [ ]  palpitations   [ ]  orthopnea   [ ]  dyspnea on exertion   [ ]  claudication   [ ]  rest pain   [ ]  DVT   [ ]  phlebitis PULMONARY:   [ ]  productive cough   [ ]  asthma   [ ]  wheezing NEUROLOGIC:   [ ]  weakness  [ ]  paresthesias  [ ]  aphasia  [ ]  amaurosis  [ ]  dizziness HEMATOLOGIC:   [ ]  bleeding problems   [ ]  clotting disorders MUSCULOSKELETAL:  [ ]  joint pain   [ ]  joint swelling [ ]  leg  swelling GASTROINTESTINAL: [ ]   blood in stool  [ ]   hematemesis GENITOURINARY:  [ ]   dysuria  [ ]   hematuria PSYCHIATRIC:  [ ]  history of major depression INTEGUMENTARY:  [ ]  rashes  [ ]  ulcers CONSTITUTIONAL:  [ ]  fever   [ ]  chills  PHYSICAL EXAM: Filed Vitals:   02/13/12 1507 02/13/12 1511  BP: 172/89 154/89  Pulse: 83   Height: 5' 10"  (1.778 m)   Weight: 236 lb (107.049 kg)   SpO2: 100%    Body mass index is 33.86 kg/(m^2). GENERAL: The patient is a well-nourished male, in no acute distress. The vital signs are documented above. CARDIOVASCULAR: There is a regular rate and rhythm. I do not detect carotid bruits. He has palpable femoral pulses. I cannot palpate a popliteal or pedal pulses on the left. He has mild bilateral lower extremity swelling. PULMONARY: There is good air exchange bilaterally without wheezing or rales. ABDOMEN: Soft and non-tender with normal pitched bowel sounds.  MUSCULOSKELETAL: There are no major deformities or cyanosis. NEUROLOGIC: No focal weakness or paresthesias are detected. SKIN: There are no ulcers or rashes noted. PSYCHIATRIC: The patient has a normal affect.  DATA:  Lab Results  Component Value Date   WBC 6.8 01/28/2012   HGB 11.5* 01/28/2012   HCT 36.1* 01/28/2012   MCV 67.4* 01/28/2012   PLT 241 01/28/2012   Lab Results  Component Value Date   NA 135 01/28/2012   K 4.8 01/28/2012   CL 101 01/28/2012   CO2 25 01/28/2012   Lab Results  Component Value Date   CREATININE 1.12 01/28/2012   Lab Results  Component Value Date   INR 1.31 06/14/2011   INR 1.00 06/12/2011   INR 1.1* 05/31/2011   Lab Results  Component Value Date   HGBA1C 6.7 01/28/2012   Preoperative vein mapping demonstrates that the left greater saphenous vein appears to have adequate diameters from the saphenofemoral junction to the mid calf.  Carotid duplex scan shows a widely patent right carotid endarterectomy site without evidence of restenosis. There is no significant  stenosis on the left.  His arteriogram shows, on the left side, some eccentric plaque in the left common femoral artery. There is mild diffuse disease of the superficial femoral artery area the above-knee popliteal artery is occluded and there is reconstitution of the below knee  popliteal artery. The patient has diffuse calcific disease. The anterior tibial and posterior tibial arteries are patent.  MEDICAL ISSUES:  Atherosclerosis of native arteries of the extremities with intermittent claudication This patient has progressive disabling claudication of the left lower extremity. He is not a candidate for an endovascular revascularization. He wishes to proceed with left femoropopliteal bypass grafting. I have reviewed the indications for lower extremity bypass. I have also reviewed the potential complications of surgery including but not limited to: wound healing problems, infection, graft thrombosis, limb loss, or other unpredictable medical problems. Given that he has had a previous knee replacement on the left I have explained that he is at increased risk for lymphedema and leg swelling in the left leg. I've also discussed the need for continued long-term follow up of his graft. All the patient's questions were answered and they are agreeable to proceed. His surgery is scheduled for 03/06/2012.  CAROTID ARTERY DISEASE This patient has a widely patent right carotid endarterectomy site with no evidence of carotid stenosis on the left. I have ordered a fall carotid duplex scan in 1 year. The meantime he knows to continue taking his aspirin.   [place] [facility] [place] Beeper: [phone]

## 2012-02-13 NOTE — Assessment & Plan Note (Signed)
This patient has progressive disabling claudication of the left lower extremity. He is not a candidate for an endovascular revascularization. He wishes to proceed with left femoropopliteal bypass grafting. I have reviewed the indications for lower extremity bypass. I have also reviewed the potential complications of surgery including but not limited to: wound healing problems, infection, graft thrombosis, limb loss, or other unpredictable medical problems. Given that he has had a previous knee replacement on the left I have explained that he is at increased risk for lymphedema and leg swelling in the left leg. I've also discussed the need for continued long-term follow up of his graft. All the patient's questions were answered and they are agreeable to proceed. His surgery is scheduled for 03/06/2012.

## 2012-02-14 ENCOUNTER — Other Ambulatory Visit: Payer: Self-pay | Admitting: Internal Medicine

## 2012-02-16 ENCOUNTER — Other Ambulatory Visit: Payer: Self-pay | Admitting: Internal Medicine

## 2012-02-16 DIAGNOSIS — R9389 Abnormal findings on diagnostic imaging of other specified body structures: Secondary | ICD-10-CM | POA: Insufficient documentation

## 2012-02-19 ENCOUNTER — Telehealth: Payer: Self-pay | Admitting: Internal Medicine

## 2012-02-19 NOTE — Telephone Encounter (Signed)
New Problem   Refill Request Metoprolol 50 mg to [facility] on [address]

## 2012-02-20 ENCOUNTER — Other Ambulatory Visit: Payer: Self-pay

## 2012-02-20 ENCOUNTER — Other Ambulatory Visit: Payer: Self-pay | Admitting: Internal Medicine

## 2012-02-20 MED ORDER — METOPROLOL TARTRATE 50 MG PO TABS: ORAL_TABLET | ORAL | Status: DC

## 2012-02-24 ENCOUNTER — Ambulatory Visit (INDEPENDENT_AMBULATORY_CARE_PROVIDER_SITE_OTHER): Payer: Federal, State, Local not specified - PPO | Admitting: Emergency Medicine

## 2012-02-24 ENCOUNTER — Ambulatory Visit: Payer: Federal, State, Local not specified - PPO

## 2012-02-24 VITALS — BP 172/84 | HR 92 | Temp 98.2°F | Resp 18 | Ht 70.0 in | Wt 242.0 lb

## 2012-02-24 DIAGNOSIS — R042 Hemoptysis: Secondary | ICD-10-CM

## 2012-02-24 DIAGNOSIS — E1149 Type 2 diabetes mellitus with other diabetic neurological complication: Secondary | ICD-10-CM

## 2012-02-24 DIAGNOSIS — R05 Cough: Secondary | ICD-10-CM

## 2012-02-24 DIAGNOSIS — R059 Cough, unspecified: Secondary | ICD-10-CM

## 2012-02-24 DIAGNOSIS — R509 Fever, unspecified: Secondary | ICD-10-CM

## 2012-02-24 LAB — POCT CBC
Hemoglobin: 12.6 g/dL — AB (ref 14.1–18.1)
MCH, POC: 21.8 pg — AB (ref 27–31.2)
MCV: 69.4 fL — AB (ref 80–97)
MID (cbc): 1 — AB (ref 0–0.9)
RBC: 5.78 M/uL (ref 4.69–6.13)
WBC: 11.5 10*3/uL — AB (ref 4.6–10.2)

## 2012-02-24 MED ORDER — CEFDINIR 300 MG PO CAPS: 600.0000 mg | ORAL_CAPSULE | Freq: Every day | ORAL | Status: DC

## 2012-02-24 MED ORDER — HYDROCODONE-HOMATROPINE 5-1.5 MG/5ML PO SYRP: 5.0000 mL | ORAL_SOLUTION | Freq: Three times a day (TID) | ORAL | Status: DC | PRN

## 2012-02-24 NOTE — Progress Notes (Signed)
Subjective:    Patient ID: Edward [name], male    DOB: [birth year], 72 y.o.   MRN: [id number]  HPI patient got sick this week with fever headache cough scratchy throat. He seemed to get worse on Thursday and had oral amoxicillin left at home and started on this medication . Since then he has gotten progressively worse with increasing shortness of breath and a productive cough. He had a flu shot this year. He did not stop his coughing. He gets winded with light exertion. Some of the phlegm today he has had a bloody phlegm.    Review of Systems     Objective:   Physical Exam patient has a clear rhinorrhea. TMs are clear. Posterior pharynx is clear. Neck is supple. Chest exam reveals rales in the bases on both sides worse on the right. Cardiac exam is regular rate and rhythm. Abdomen is soft nontender  [facility] reading (PRIMARY) by  Dr. Everlene Farrier patient is status post sternotomy heart size is increased. There is increased markings in both bases and some questionable small nodular cluster-like areas in the right upper lobe.Pacemaker present  Results for orders placed in visit on 02/24/12  POCT CBC      Result Value Range   WBC 11.5 (*) 4.6 - 10.2 K/uL   Lymph, poc 1.5  0.6 - 3.4   POC LYMPH PERCENT 13.2  10 - 50 %L   MID (cbc) 1.0 (*) 0 - 0.9   POC MID % 9.1  0 - 12 %M   POC Granulocyte 8.9 (*) 2 - 6.9   Granulocyte percent 77.7  37 - 80 %G   RBC 5.78  4.69 - 6.13 M/uL   Hemoglobin 12.6 (*) 14.1 - 18.1 g/dL   HCT, POC 40.1 (*) 43.5 - 53.7 %   MCV 69.4 (*) 80 - 97 fL   MCH, POC 21.8 (*) 27 - 31.2 pg   MCHC 31.4 (*) 31.8 - 35.4 g/dL   RDW, POC 20.4     Platelet Count, POC 309  142 - 424 K/uL   MPV 8.2  0 - 99.8 fL  POCT INFLUENZA A/B      Result Value Range   Influenza A, POC Negative     Influenza B, POC Negative    GLUCOSE, POCT (MANUAL RESULT ENTRY)      Result Value Range   POC Glucose 184 (*) 70 - 99 mg/dl   Results for orders placed in visit on 02/24/12  POCT CBC      Result  Value Range   WBC 11.5 (*) 4.6 - 10.2 K/uL   Lymph, poc 1.5  0.6 - 3.4   POC LYMPH PERCENT 13.2  10 - 50 %L   MID (cbc) 1.0 (*) 0 - 0.9   POC MID % 9.1  0 - 12 %M   POC Granulocyte 8.9 (*) 2 - 6.9   Granulocyte percent 77.7  37 - 80 %G   RBC 5.78  4.69 - 6.13 M/uL   Hemoglobin 12.6 (*) 14.1 - 18.1 g/dL   HCT, POC 40.1 (*) 43.5 - 53.7 %   MCV 69.4 (*) 80 - 97 fL   MCH, POC 21.8 (*) 27 - 31.2 pg   MCHC 31.4 (*) 31.8 - 35.4 g/dL   RDW, POC 20.4     Platelet Count, POC 309  142 - 424 K/uL   MPV 8.2  0 - 99.8 fL  POCT INFLUENZA A/B      Result Value Range  Influenza A, POC Negative     Influenza B, POC Negative    GLUCOSE, POCT (MANUAL RESULT ENTRY)      Result Value Range   POC Glucose 184 (*) 70 - 99 mg/dl  IFOBT (OCCULT BLOOD)      Result Value Range   IFOBT Negative         Assessment & Plan:  Patient presents with signs and symptoms which could be flu-related however he has had a flu shot this year his flu test is negative. White count is slightly elevated at 11.5. He has multiple comorbidities. Patient walked  in the office and maintained  his pulse ox at 97%.. his chest x-ray is abnormal and will need to be repeated. Will discuss this with his cardiothoracic surgeon Dr. Scot Dock

## 2012-02-29 ENCOUNTER — Encounter (HOSPITAL_COMMUNITY): Payer: Self-pay

## 2012-02-29 ENCOUNTER — Other Ambulatory Visit: Payer: Self-pay | Admitting: *Deleted

## 2012-02-29 ENCOUNTER — Ambulatory Visit (HOSPITAL_COMMUNITY)
Admission: RE | Admit: 2012-02-29 | Discharge: 2012-02-29 | Disposition: A | Discharge status: Home / Self Care | Payer: Federal, State, Local not specified - PPO | Source: Ambulatory Visit | Attending: Vascular Surgery | Admitting: Vascular Surgery

## 2012-02-29 ENCOUNTER — Encounter (HOSPITAL_COMMUNITY)
Admission: RE | Admit: 2012-02-29 | Discharge: 2012-02-29 | Disposition: A | Discharge status: Home / Self Care | Payer: Federal, State, Local not specified - PPO | Source: Ambulatory Visit | Attending: Vascular Surgery | Admitting: Vascular Surgery

## 2012-02-29 DIAGNOSIS — R059 Cough, unspecified: Secondary | ICD-10-CM | POA: Insufficient documentation

## 2012-02-29 DIAGNOSIS — R918 Other nonspecific abnormal finding of lung field: Secondary | ICD-10-CM

## 2012-02-29 DIAGNOSIS — I700 Atherosclerosis of aorta: Secondary | ICD-10-CM | POA: Insufficient documentation

## 2012-02-29 DIAGNOSIS — Z951 Presence of aortocoronary bypass graft: Secondary | ICD-10-CM | POA: Insufficient documentation

## 2012-02-29 DIAGNOSIS — R911 Solitary pulmonary nodule: Secondary | ICD-10-CM | POA: Insufficient documentation

## 2012-02-29 DIAGNOSIS — R05 Cough: Secondary | ICD-10-CM | POA: Insufficient documentation

## 2012-02-29 DIAGNOSIS — J3489 Other specified disorders of nose and nasal sinuses: Secondary | ICD-10-CM | POA: Insufficient documentation

## 2012-02-29 HISTORY — DX: Anemia, unspecified: D64.9

## 2012-02-29 HISTORY — DX: Cerebral infarction, unspecified: I63.9

## 2012-02-29 HISTORY — DX: Presence of cardiac pacemaker: Z95.0

## 2012-02-29 LAB — URINALYSIS, ROUTINE W REFLEX MICROSCOPIC
Bilirubin Urine: NEGATIVE
Glucose, UA: NEGATIVE mg/dL
Hgb urine dipstick: NEGATIVE
Ketones, ur: NEGATIVE mg/dL
Leukocytes, UA: NEGATIVE
Nitrite: NEGATIVE
Protein, ur: 30 mg/dL — AB
Specific Gravity, Urine: 1.013 (ref 1.005–1.030)
Urobilinogen, UA: 0.2 mg/dL (ref 0.0–1.0)
pH: 5 (ref 5.0–8.0)

## 2012-02-29 LAB — CBC
HCT: 39.8 % (ref 39.0–52.0)
Hemoglobin: 12.9 g/dL — ABNORMAL LOW (ref 13.0–17.0)
MCH: 22 pg — ABNORMAL LOW (ref 26.0–34.0)
MCHC: 32.4 g/dL (ref 30.0–36.0)
MCV: 67.9 fL — ABNORMAL LOW (ref 78.0–100.0)
Platelets: 253 10*3/uL (ref 150–400)
RBC: 5.86 MIL/uL — ABNORMAL HIGH (ref 4.22–5.81)
RDW: 18.3 % — ABNORMAL HIGH (ref 11.5–15.5)
WBC: 7.8 10*3/uL (ref 4.0–10.5)

## 2012-02-29 LAB — COMPREHENSIVE METABOLIC PANEL
ALT: 22 U/L (ref 0–53)
AST: 31 U/L (ref 0–37)
Albumin: 3.3 g/dL — ABNORMAL LOW (ref 3.5–5.2)
Alkaline Phosphatase: 86 U/L (ref 39–117)
BUN: 18 mg/dL (ref 6–23)
CO2: 24 mEq/L (ref 19–32)
Calcium: 9.2 mg/dL (ref 8.4–10.5)
Chloride: 96 mEq/L (ref 96–112)
Creatinine, Ser: 1.12 mg/dL (ref 0.50–1.35)
GFR calc Af Amer: 76 mL/min — ABNORMAL LOW (ref >90)
GFR calc non Af Amer: 65 mL/min — ABNORMAL LOW (ref >90)
Glucose, Bld: 163 mg/dL — ABNORMAL HIGH (ref 70–99)
Potassium: 4.3 mEq/L (ref 3.5–5.1)
Sodium: 131 mEq/L — ABNORMAL LOW (ref 135–145)
Total Bilirubin: 0.4 mg/dL (ref 0.3–1.2)
Total Protein: 7.8 g/dL (ref 6.0–8.3)

## 2012-02-29 LAB — PROTIME-INR: INR: 0.94 (ref 0.00–1.49)

## 2012-02-29 LAB — URINE MICROSCOPIC-ADD ON

## 2012-02-29 LAB — SURGICAL PCR SCREEN
MRSA, PCR: NEGATIVE
Staphylococcus aureus: NEGATIVE

## 2012-02-29 MED ORDER — IOHEXOL 300 MG/ML  SOLN
75.0000 mL | Freq: Once | INTRAMUSCULAR | Status: AC | PRN
  Administered 2012-02-29: 75 mL via INTRAVENOUS

## 2012-02-29 NOTE — Research (Signed)
Notes from dr Acie Fredrickson, dr Caryl Comes in epic 1/14,myoview 1/14,echo5/ 12, dopplers 1/14, holter monitor 1/14 Icd/pacer order faxed to [facility]

## 2012-02-29 NOTE — Progress Notes (Addendum)
Patient said dr Scot Dock to get ct of chest prior to surgery due to abnormal cxr done 2 weeks ago at [facility]. req'd cxr . Judy at office called to get order. No mention in dr dickson's notes per judy.patient informed

## 2012-02-29 NOTE — Pre-Procedure Instructions (Addendum)
Edward [name]  02/29/2012   Your procedure is scheduled on:  03/06/12  Report to [place] at 530 AM.  Call this number if you have problems the morning of surgery: [phone]   Remember:   Do not eat food or drink liquids after midnight.   Take these medicines the morning of surgery with A SIP OF WATER:pain med, amlodipine, omnicef, metoprolol  STOP coq10,now   Do not wear jewelry, make-up or nail polish.  Do not wear lotions, powders, or perfumes. You may wear deodorant.  Do not shave 48 hours prior to surgery. Men may shave face and neck.  Do not bring valuables to the hospital.  Contacts, dentures or bridgework may not be worn into surgery.  Leave suitcase in the car. After surgery it may be brought to your room.  For patients admitted to the hospital, checkout time is 11:00 AM the day of  discharge.   Patients discharged the day of surgery will not be allowed to drive  home.  Name and phone number of your driver:   Special Instructions: Shower using CHG 2 nights before surgery and the night before surgery.  If you shower the day of surgery use CHG.  Use special wash - you have one bottle of CHG for all showers.  You should use approximately 1/3 of the bottle for each shower.   Please read over the following fact sheets that you were given: Pain Booklet, Coughing and Deep Breathing, Blood Transfusion Information, MRSA Information and Surgical Site Infection Prevention

## 2012-03-02 ENCOUNTER — Other Ambulatory Visit: Payer: Self-pay | Admitting: Emergency Medicine

## 2012-03-03 ENCOUNTER — Telehealth: Payer: Self-pay | Admitting: Vascular Surgery

## 2012-03-03 ENCOUNTER — Telehealth: Payer: Self-pay

## 2012-03-03 NOTE — Telephone Encounter (Signed)
CT chest was ordered on 02/29/12 per order Dr. Scot Dock.

## 2012-03-03 NOTE — Telephone Encounter (Signed)
Message copied by Denman George on Mon Mar 03, 2012  9:00 AM ------      Message from: FITZPATRICK, DOROTHY K      Created: Wed Feb 27, 2012  2:35 PM      Regarding: RE: needs Chest CT       [facility] is closed. I will try on Friday to schedule this. Just FYI- kf            ----- Message -----         From: Sherrye Payor, RN         Sent: 02/27/2012   2:21 PM           To: [facility] Admin Pool      Subject: needs Chest CT                                           Please schedule a CT of chest per order Dr. Scot Dock; has right upper lobe nodule on pre-op CXR.  Pt. is scheduled for left fem-pop BP on 03/06/12.  Needs to have the CT chest prior to surgery date.  Dr. Scot Dock has informed pt. About the above.       ------

## 2012-03-03 NOTE — Consult Note (Signed)
Anesthesia Chart Review:  Patient is a 72 year old male scheduled for left FPBG on 03/06/12 by Dr. Scot Dock.  History includes CAD s/p MAZE procedure, ligation of LA appendage, CABG X 6 (LIMA to LAD, SVG to D1, SVG to D2, SVG to OM, sequential SVG to PD and PL3) 06/14/11, post-CABG SSS s/p [employer] PPM 06/2011,  afib s/p multiple cardioversions not treated with anticoagulation due to history of GI bleed, left BBB, PAD, carotid artery stenosis s/p right CEA '11, HTN, DM2 with peripheral neuropathy, dyslipidemia, GERD, former smoker, obesity.  PCP is Dr. Lou Miner.  Primary Cardiologist is Dr. Acie Fredrickson.  EP Cardiologist is Dr. Caryl Comes, last visit 01/29/12.  His device was interrogated then and showed no evidence of afib, summary junctional rhythm suppressed by overdrive pacing.  No changes were made to device programming.  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 LV Ejection Fraction: 59%. LV Wall Motion: NL LV Function; NL Wall Motion.  Echo on 06/01/10 showed: - Left ventricle: Mild upper septal thickening. The cavity size was normal. The estimated ejection fraction was 65%. Wall motion was normal; there were no regional wall motion abnormalities. - Aortic valve: Sclerosis without stenosis. - Right atrium: The atrium was mildly dilated.  Holter monitor read on 02/01/12 showed v-paced rhythm with occasional non-paced beats, rare PVC.  His last cath was pre-CABG on 06/05/11 which showed 3V CAD, EF 40-50%.  EKG on 02/29/12 showed AV dual paced rhythm, left BBB.  CXR on 02/24/12 showed: 1. No clear acute cardiopulmonary process.  2. Nodular density in the right upper lobe. Rrecommend CT thorax with contrast for further evaluation. (CXR done when patient presented with flu-like symptoms.  Notes indicate that he self treated with  Amoxicillin at home and that tests were negative for influenza.  O2 sats remained 94% or greater.)  Chest CT on 02/29/12 showed: Minimal bibasilar tree in bud nodular airspace opacities and areas of intrabronchial secretions, most compatible with small airways infection. Background emphysematous changes are noted. No persistent nodularity projects over the right upper lobe to correspond with the previous exam findings; this may represent improving infection given the previous appearance and current findings.   Preoperative labs noted.  WBC was WNL.  I reviewed above with Anesthesiologist Dr. Conrad Trenton.  Patient has had close cardiology follow-up with recent low risk post-CABG stress test.  Chest CT report routed to Dr. Scot Dock.  If no contraindication from a vascular standpoint, then could likely proceed from an anesthesia standpoint if no acute cardiopulmonary symptoms.  Myra Gianotti, PA-C 03/03/12 1542

## 2012-03-04 ENCOUNTER — Other Ambulatory Visit: Payer: Self-pay | Admitting: Radiology

## 2012-03-05 MED ORDER — CEFUROXIME SODIUM 1.5 G IJ SOLR
1.5000 g | INTRAMUSCULAR | Status: AC
  Administered 2012-03-06: 1.5 g via INTRAVENOUS
  Filled 2012-03-05: qty 1.5

## 2012-03-05 MED ORDER — SODIUM CHLORIDE 0.9 % IV SOLN: INTRAVENOUS | Status: DC

## 2012-03-06 ENCOUNTER — Inpatient Hospital Stay (HOSPITAL_COMMUNITY): Payer: Medicare Other | Admitting: Anesthesiology

## 2012-03-06 ENCOUNTER — Encounter (HOSPITAL_COMMUNITY): Payer: Self-pay | Admitting: Vascular Surgery

## 2012-03-06 ENCOUNTER — Inpatient Hospital Stay (HOSPITAL_COMMUNITY): Payer: Medicare Other

## 2012-03-06 ENCOUNTER — Encounter (HOSPITAL_COMMUNITY)
Admission: RE | Disposition: A | Discharge status: Home / Self Care | Payer: Self-pay | Source: Ambulatory Visit | Attending: Vascular Surgery

## 2012-03-06 ENCOUNTER — Encounter (HOSPITAL_COMMUNITY): Payer: Self-pay | Admitting: *Deleted

## 2012-03-06 ENCOUNTER — Inpatient Hospital Stay (HOSPITAL_COMMUNITY)
Admission: RE | Admit: 2012-03-06 | Discharge: 2012-03-09 | DRG: 254 | Disposition: A | Discharge status: Home / Self Care | Payer: Medicare Other | Source: Ambulatory Visit | Attending: Vascular Surgery | Admitting: Vascular Surgery

## 2012-03-06 DIAGNOSIS — Z951 Presence of aortocoronary bypass graft: Secondary | ICD-10-CM

## 2012-03-06 DIAGNOSIS — K219 Gastro-esophageal reflux disease without esophagitis: Secondary | ICD-10-CM | POA: Diagnosis present

## 2012-03-06 DIAGNOSIS — I70219 Atherosclerosis of native arteries of extremities with intermittent claudication, unspecified extremity: Principal | ICD-10-CM

## 2012-03-06 DIAGNOSIS — Z950 Presence of cardiac pacemaker: Secondary | ICD-10-CM

## 2012-03-06 DIAGNOSIS — Z7982 Long term (current) use of aspirin: Secondary | ICD-10-CM

## 2012-03-06 DIAGNOSIS — I4891 Unspecified atrial fibrillation: Secondary | ICD-10-CM | POA: Diagnosis present

## 2012-03-06 DIAGNOSIS — I2510 Atherosclerotic heart disease of native coronary artery without angina pectoris: Secondary | ICD-10-CM | POA: Diagnosis present

## 2012-03-06 DIAGNOSIS — I739 Peripheral vascular disease, unspecified: Secondary | ICD-10-CM

## 2012-03-06 DIAGNOSIS — I743 Embolism and thrombosis of arteries of the lower extremities: Secondary | ICD-10-CM

## 2012-03-06 DIAGNOSIS — E119 Type 2 diabetes mellitus without complications: Secondary | ICD-10-CM | POA: Diagnosis present

## 2012-03-06 HISTORY — PX: FEMORAL-POPLITEAL BYPASS GRAFT: SHX937

## 2012-03-06 LAB — GLUCOSE, CAPILLARY
Glucose-Capillary: 157 mg/dL — ABNORMAL HIGH (ref 70–99)
Glucose-Capillary: 96 mg/dL (ref 70–99)

## 2012-03-06 SURGERY — BYPASS GRAFT FEMORAL-POPLITEAL ARTERY
Anesthesia: General | Site: Leg Upper | Laterality: Left | Wound class: Clean

## 2012-03-06 MED ORDER — MORPHINE SULFATE 2 MG/ML IJ SOLN
2.0000 mg | INTRAMUSCULAR | Status: DC | PRN
Start: 2012-03-06 — End: 2012-03-09
  Administered 2012-03-06 (×2): 2 mg via INTRAVENOUS
  Filled 2012-03-06: qty 1

## 2012-03-06 MED ORDER — ACETAMINOPHEN 650 MG RE SUPP: 325.0000 mg | RECTAL | Status: DC | PRN

## 2012-03-06 MED ORDER — INSULIN ASPART 100 UNIT/ML ~~LOC~~ SOLN
0.0000 [IU] | Freq: Three times a day (TID) | SUBCUTANEOUS | Status: DC
  Administered 2012-03-07 (×2): 3 [IU] via SUBCUTANEOUS
  Administered 2012-03-07: 2 [IU] via SUBCUTANEOUS
  Administered 2012-03-08: 3 [IU] via SUBCUTANEOUS
  Administered 2012-03-08 – 2012-03-09 (×3): 2 [IU] via SUBCUTANEOUS

## 2012-03-06 MED ORDER — LORATADINE 10 MG PO TABS
10.0000 mg | ORAL_TABLET | Freq: Every day | ORAL | Status: DC
  Administered 2012-03-07 – 2012-03-08 (×2): 10 mg via ORAL
  Filled 2012-03-06 (×3): qty 1

## 2012-03-06 MED ORDER — HYDROMORPHONE HCL PF 1 MG/ML IJ SOLN
INTRAMUSCULAR | Status: AC
  Filled 2012-03-06: qty 1

## 2012-03-06 MED ORDER — L-METHYLFOLATE-B6-B12 3-35-2 MG PO TABS
1.0000 | ORAL_TABLET | Freq: Every morning | ORAL | Status: DC
  Administered 2012-03-07 – 2012-03-08 (×2): 1 via ORAL
  Filled 2012-03-06 (×3): qty 1

## 2012-03-06 MED ORDER — MAGNESIUM SULFATE 40 MG/ML IJ SOLN
2.0000 g | Freq: Every day | INTRAMUSCULAR | Status: DC | PRN
  Filled 2012-03-06: qty 50

## 2012-03-06 MED ORDER — PHENYLEPHRINE HCL 10 MG/ML IJ SOLN
INTRAMUSCULAR | Status: DC | PRN
  Administered 2012-03-06: 200 ug via INTRAVENOUS
  Administered 2012-03-06 (×3): 80 ug via INTRAVENOUS
  Administered 2012-03-06: 40 ug via INTRAVENOUS
  Administered 2012-03-06: 80 ug via INTRAVENOUS

## 2012-03-06 MED ORDER — LABETALOL HCL 5 MG/ML IV SOLN
10.0000 mg | INTRAVENOUS | Status: DC | PRN
  Filled 2012-03-06: qty 4

## 2012-03-06 MED ORDER — POTASSIUM CHLORIDE CRYS ER 20 MEQ PO TBCR
20.0000 meq | EXTENDED_RELEASE_TABLET | Freq: Every morning | ORAL | Status: DC
  Administered 2012-03-07 – 2012-03-08 (×2): 20 meq via ORAL
  Filled 2012-03-06 (×3): qty 1

## 2012-03-06 MED ORDER — NEOSTIGMINE METHYLSULFATE 1 MG/ML IJ SOLN
INTRAMUSCULAR | Status: DC | PRN
  Administered 2012-03-06: 3 mg via INTRAVENOUS

## 2012-03-06 MED ORDER — HYDROMORPHONE HCL PF 1 MG/ML IJ SOLN
0.2500 mg | INTRAMUSCULAR | Status: DC | PRN
  Administered 2012-03-06 (×3): 0.5 mg via INTRAVENOUS

## 2012-03-06 MED ORDER — LIDOCAINE HCL (CARDIAC) 20 MG/ML IV SOLN
INTRAVENOUS | Status: DC | PRN
  Administered 2012-03-06: 50 mg via INTRAVENOUS

## 2012-03-06 MED ORDER — ACETAMINOPHEN 325 MG PO TABS: 325.0000 mg | ORAL_TABLET | ORAL | Status: DC | PRN

## 2012-03-06 MED ORDER — ASPIRIN EC 81 MG PO TBEC
81.0000 mg | DELAYED_RELEASE_TABLET | Freq: Every day | ORAL | Status: DC
  Administered 2012-03-07 – 2012-03-08 (×2): 81 mg via ORAL
  Filled 2012-03-06 (×3): qty 1

## 2012-03-06 MED ORDER — MIDAZOLAM HCL 5 MG/5ML IJ SOLN
INTRAMUSCULAR | Status: DC | PRN
  Administered 2012-03-06: 7 mg via INTRAVENOUS
  Administered 2012-03-06: 2 mg via INTRAVENOUS

## 2012-03-06 MED ORDER — METOPROLOL TARTRATE 1 MG/ML IV SOLN: 2.0000 mg | INTRAVENOUS | Status: DC | PRN

## 2012-03-06 MED ORDER — IOHEXOL 300 MG/ML  SOLN
INTRAMUSCULAR | Status: DC | PRN
  Administered 2012-03-06: 23 mL via INTRAVENOUS

## 2012-03-06 MED ORDER — POTASSIUM CHLORIDE CRYS ER 20 MEQ PO TBCR: 20.0000 meq | EXTENDED_RELEASE_TABLET | Freq: Every day | ORAL | Status: DC | PRN

## 2012-03-06 MED ORDER — AMLODIPINE BESYLATE 5 MG PO TABS
5.0000 mg | ORAL_TABLET | Freq: Every evening | ORAL | Status: DC
  Administered 2012-03-07 – 2012-03-08 (×2): 5 mg via ORAL
  Filled 2012-03-06 (×3): qty 1

## 2012-03-06 MED ORDER — PAPAVERINE HCL 30 MG/ML IJ SOLN
INTRAMUSCULAR | Status: DC | PRN
  Administered 2012-03-06: 60 mg via INTRAVENOUS

## 2012-03-06 MED ORDER — ALBUMIN HUMAN 5 % IV SOLN
INTRAVENOUS | Status: DC | PRN
  Administered 2012-03-06: 13:00:00 via INTRAVENOUS

## 2012-03-06 MED ORDER — DOCUSATE SODIUM 100 MG PO CAPS
100.0000 mg | ORAL_CAPSULE | Freq: Every day | ORAL | Status: DC
  Administered 2012-03-07 – 2012-03-08 (×2): 100 mg via ORAL
  Filled 2012-03-06 (×3): qty 1

## 2012-03-06 MED ORDER — ROCURONIUM BROMIDE 100 MG/10ML IV SOLN
INTRAVENOUS | Status: DC | PRN
  Administered 2012-03-06: 50 mg via INTRAVENOUS

## 2012-03-06 MED ORDER — HYDRALAZINE HCL 20 MG/ML IJ SOLN
10.0000 mg | INTRAMUSCULAR | Status: DC | PRN
  Filled 2012-03-06: qty 0.5

## 2012-03-06 MED ORDER — PAPAVERINE HCL 30 MG/ML IJ SOLN
INTRAMUSCULAR | Status: AC
  Filled 2012-03-06: qty 2

## 2012-03-06 MED ORDER — ONDANSETRON HCL 4 MG/2ML IJ SOLN: 4.0000 mg | Freq: Four times a day (QID) | INTRAMUSCULAR | Status: DC | PRN

## 2012-03-06 MED ORDER — PANTOPRAZOLE SODIUM 40 MG PO TBEC
40.0000 mg | DELAYED_RELEASE_TABLET | Freq: Every day | ORAL | Status: DC
  Administered 2012-03-06 – 2012-03-08 (×3): 40 mg via ORAL
  Filled 2012-03-06 (×3): qty 1

## 2012-03-06 MED ORDER — DEXTROSE 5 % IV SOLN
1.5000 g | Freq: Two times a day (BID) | INTRAVENOUS | Status: AC
  Administered 2012-03-06 – 2012-03-07 (×2): 1.5 g via INTRAVENOUS
  Filled 2012-03-06 (×2): qty 1.5

## 2012-03-06 MED ORDER — GLYCOPYRROLATE 0.2 MG/ML IJ SOLN
INTRAMUSCULAR | Status: DC | PRN
  Administered 2012-03-06: .4 mg via INTRAVENOUS

## 2012-03-06 MED ORDER — VECURONIUM BROMIDE 10 MG IV SOLR
INTRAVENOUS | Status: DC | PRN
  Administered 2012-03-06 (×4): 2 mg via INTRAVENOUS

## 2012-03-06 MED ORDER — SODIUM CHLORIDE 0.9 % IV SOLN
INTRAVENOUS | Status: DC
  Administered 2012-03-06 – 2012-03-07 (×2): via INTRAVENOUS

## 2012-03-06 MED ORDER — METOPROLOL TARTRATE 50 MG PO TABS
50.0000 mg | ORAL_TABLET | Freq: Two times a day (BID) | ORAL | Status: DC
  Administered 2012-03-06 – 2012-03-08 (×5): 50 mg via ORAL
  Filled 2012-03-06 (×7): qty 1

## 2012-03-06 MED ORDER — OXYCODONE HCL 5 MG/5ML PO SOLN: 5.0000 mg | Freq: Once | ORAL | Status: DC | PRN

## 2012-03-06 MED ORDER — PHENOL 1.4 % MT LIQD: 1.0000 | OROMUCOSAL | Status: DC | PRN

## 2012-03-06 MED ORDER — PROPOFOL 10 MG/ML IV BOLUS
INTRAVENOUS | Status: DC | PRN
  Administered 2012-03-06: 160 mg via INTRAVENOUS

## 2012-03-06 MED ORDER — METOPROLOL TARTRATE 50 MG PO TABS
ORAL_TABLET | ORAL | Status: AC
  Administered 2012-03-06: 50 mg via ORAL
  Filled 2012-03-06: qty 1

## 2012-03-06 MED ORDER — PROTAMINE SULFATE 10 MG/ML IV SOLN
INTRAVENOUS | Status: DC | PRN
  Administered 2012-03-06: 10 mg via INTRAVENOUS
  Administered 2012-03-06: 30 mg via INTRAVENOUS

## 2012-03-06 MED ORDER — LOSARTAN POTASSIUM 50 MG PO TABS
50.0000 mg | ORAL_TABLET | Freq: Every morning | ORAL | Status: DC
  Administered 2012-03-07 – 2012-03-08 (×2): 50 mg via ORAL
  Filled 2012-03-06 (×3): qty 1

## 2012-03-06 MED ORDER — ONDANSETRON HCL 4 MG/2ML IJ SOLN
INTRAMUSCULAR | Status: DC | PRN
  Administered 2012-03-06: 4 mg via INTRAVENOUS

## 2012-03-06 MED ORDER — 0.9 % SODIUM CHLORIDE (POUR BTL) OPTIME
TOPICAL | Status: DC | PRN
  Administered 2012-03-06: 1000 mL

## 2012-03-06 MED ORDER — EPHEDRINE SULFATE 50 MG/ML IJ SOLN
INTRAMUSCULAR | Status: DC | PRN
  Administered 2012-03-06: 5 mg via INTRAVENOUS

## 2012-03-06 MED ORDER — GUAIFENESIN-DM 100-10 MG/5ML PO SYRP
15.0000 mL | ORAL_SOLUTION | ORAL | Status: DC | PRN
  Administered 2012-03-06: 15 mL via ORAL
  Filled 2012-03-06: qty 15

## 2012-03-06 MED ORDER — OXYCODONE-ACETAMINOPHEN 5-325 MG PO TABS
1.0000 | ORAL_TABLET | ORAL | Status: DC | PRN
  Administered 2012-03-06 – 2012-03-07 (×2): 2 via ORAL
  Administered 2012-03-07: 1 via ORAL
  Administered 2012-03-07: 2 via ORAL
  Administered 2012-03-07 (×2): 1 via ORAL
  Administered 2012-03-08 – 2012-03-09 (×5): 2 via ORAL
  Filled 2012-03-06 (×6): qty 2
  Filled 2012-03-06: qty 1
  Filled 2012-03-06 (×2): qty 2
  Filled 2012-03-06 (×2): qty 1

## 2012-03-06 MED ORDER — FUROSEMIDE 40 MG PO TABS
40.0000 mg | ORAL_TABLET | Freq: Every morning | ORAL | Status: DC
  Administered 2012-03-07 – 2012-03-08 (×2): 40 mg via ORAL
  Filled 2012-03-06 (×3): qty 1

## 2012-03-06 MED ORDER — OMEPRAZOLE MAGNESIUM 20 MG PO TBEC: 20.0000 mg | DELAYED_RELEASE_TABLET | Freq: Every morning | ORAL | Status: DC

## 2012-03-06 MED ORDER — THROMBIN 20000 UNITS EX SOLR
CUTANEOUS | Status: AC
  Filled 2012-03-06: qty 20000

## 2012-03-06 MED ORDER — LACTATED RINGERS IV SOLN
INTRAVENOUS | Status: DC
  Administered 2012-03-06: 09:00:00 via INTRAVENOUS

## 2012-03-06 MED ORDER — LACTATED RINGERS IV SOLN
INTRAVENOUS | Status: DC | PRN
  Administered 2012-03-06 (×2): via INTRAVENOUS

## 2012-03-06 MED ORDER — PHENYLEPHRINE HCL 10 MG/ML IJ SOLN
10.0000 mg | INTRAVENOUS | Status: DC | PRN
  Administered 2012-03-06: 50 ug/min via INTRAVENOUS

## 2012-03-06 MED ORDER — METOPROLOL TARTRATE 50 MG PO TABS
50.0000 mg | ORAL_TABLET | Freq: Two times a day (BID) | ORAL | Status: DC
  Filled 2012-03-06: qty 1

## 2012-03-06 MED ORDER — MORPHINE SULFATE 2 MG/ML IJ SOLN
INTRAMUSCULAR | Status: AC
  Filled 2012-03-06: qty 1

## 2012-03-06 MED ORDER — SODIUM CHLORIDE 0.9 % IV SOLN: 500.0000 mL | Freq: Once | INTRAVENOUS | Status: AC | PRN

## 2012-03-06 MED ORDER — FENTANYL CITRATE 0.05 MG/ML IJ SOLN
INTRAMUSCULAR | Status: DC | PRN
  Administered 2012-03-06: 150 ug via INTRAVENOUS
  Administered 2012-03-06 (×3): 50 ug via INTRAVENOUS

## 2012-03-06 MED ORDER — HEPARIN SODIUM (PORCINE) 1000 UNIT/ML IJ SOLN
INTRAMUSCULAR | Status: DC | PRN
  Administered 2012-03-06: 10000 [IU] via INTRAVENOUS

## 2012-03-06 MED ORDER — SODIUM CHLORIDE 0.9 % IR SOLN
Status: DC | PRN
  Administered 2012-03-06: 11:00:00

## 2012-03-06 MED ORDER — OXYCODONE HCL 5 MG PO TABS
5.0000 mg | ORAL_TABLET | Freq: Once | ORAL | Status: DC | PRN
Start: 2012-03-06 — End: 2012-03-06

## 2012-03-06 SURGICAL SUPPLY — 68 items
BANDAGE ELASTIC 4 VELCRO ST LF (GAUZE/BANDAGES/DRESSINGS) IMPLANT
BANDAGE ESMARK 6X9 LF (GAUZE/BANDAGES/DRESSINGS) IMPLANT
BNDG CMPR 9X6 STRL LF SNTH (GAUZE/BANDAGES/DRESSINGS)
BNDG ESMARK 6X9 LF (GAUZE/BANDAGES/DRESSINGS)
CANISTER SUCTION 2500CC (MISCELLANEOUS) ×2 IMPLANT
CANNULA VESSEL W/WING WO/VALVE (CANNULA) ×2 IMPLANT
CLIP TI MEDIUM 24 (CLIP) ×2 IMPLANT
CLIP TI WIDE RED SMALL 24 (CLIP) ×4 IMPLANT
CLOTH BEACON ORANGE TIMEOUT ST (SAFETY) ×2 IMPLANT
COVER SURGICAL LIGHT HANDLE (MISCELLANEOUS) ×2 IMPLANT
CUFF TOURNIQUET SINGLE 24IN (TOURNIQUET CUFF) IMPLANT
CUFF TOURNIQUET SINGLE 34IN LL (TOURNIQUET CUFF) ×2 IMPLANT
CUFF TOURNIQUET SINGLE 44IN (TOURNIQUET CUFF) IMPLANT
DERMABOND ADVANCED (GAUZE/BANDAGES/DRESSINGS) ×4
DERMABOND ADVANCED .7 DNX12 (GAUZE/BANDAGES/DRESSINGS) ×4 IMPLANT
DRAIN CHANNEL 15F RND FF W/TCR (WOUND CARE) ×4 IMPLANT
DRAIN PENROSE 3/4X12 (DRAIN) IMPLANT
DRAPE WARM FLUID 44X44 (DRAPE) ×2 IMPLANT
DRAPE X-RAY CASS 24X20 (DRAPES) ×2 IMPLANT
DRSG COVADERM 4X10 (GAUZE/BANDAGES/DRESSINGS) IMPLANT
DRSG COVADERM 4X8 (GAUZE/BANDAGES/DRESSINGS) IMPLANT
ELECT REM PT RETURN 9FT ADLT (ELECTROSURGICAL) ×2
ELECTRODE REM PT RTRN 9FT ADLT (ELECTROSURGICAL) ×1 IMPLANT
EVACUATOR SILICONE 100CC (DRAIN) ×4 IMPLANT
GAUZE SPONGE 4X4 16PLY XRAY LF (GAUZE/BANDAGES/DRESSINGS) ×2 IMPLANT
GLOVE BIO SURGEON STRL SZ7.5 (GLOVE) ×6 IMPLANT
GLOVE BIOGEL PI IND STRL 6.5 (GLOVE) ×5 IMPLANT
GLOVE BIOGEL PI IND STRL 8 (GLOVE) ×1 IMPLANT
GLOVE BIOGEL PI INDICATOR 6.5 (GLOVE) ×5
GLOVE BIOGEL PI INDICATOR 8 (GLOVE) ×1
GLOVE SS BIOGEL STRL SZ 7 (GLOVE) ×1 IMPLANT
GLOVE SUPERSENSE BIOGEL SZ 7 (GLOVE) ×1
GLOVE SURG SS PI 6.5 STRL IVOR (GLOVE) ×4 IMPLANT
GOWN PREVENTION PLUS XLARGE (GOWN DISPOSABLE) ×2 IMPLANT
GOWN STRL NON-REIN LRG LVL3 (GOWN DISPOSABLE) ×8 IMPLANT
KIT BASIN OR (CUSTOM PROCEDURE TRAY) ×2 IMPLANT
KIT ROOM TURNOVER OR (KITS) ×2 IMPLANT
LOOP VESSEL MAXI BLUE (MISCELLANEOUS) ×2 IMPLANT
MARKER GRAFT CORONARY BYPASS (MISCELLANEOUS) ×2 IMPLANT
NS IRRIG 1000ML POUR BTL (IV SOLUTION) ×4 IMPLANT
PACK PERIPHERAL VASCULAR (CUSTOM PROCEDURE TRAY) ×2 IMPLANT
PAD ARMBOARD 7.5X6 YLW CONV (MISCELLANEOUS) ×2 IMPLANT
PADDING CAST COTTON 6X4 STRL (CAST SUPPLIES) IMPLANT
SET COLLECT BLD 21X3/4 12 (NEEDLE) ×2 IMPLANT
SPONGE GAUZE 4X4 12PLY (GAUZE/BANDAGES/DRESSINGS) ×4 IMPLANT
SPONGE SURGIFOAM ABS GEL 100 (HEMOSTASIS) IMPLANT
STAPLER VISISTAT 35W (STAPLE) IMPLANT
STOPCOCK 4 WAY LG BORE MALE ST (IV SETS) ×2 IMPLANT
SUT ETHILON 3 0 PS 1 (SUTURE) ×4 IMPLANT
SUT PROLENE 5 0 C 1 24 (SUTURE) ×8 IMPLANT
SUT PROLENE 6 0 BV (SUTURE) ×4 IMPLANT
SUT PROLENE 7 0 BV 1 (SUTURE) IMPLANT
SUT SILK 2 0 (SUTURE) ×1
SUT SILK 2 0 FS (SUTURE) ×2 IMPLANT
SUT SILK 2-0 18XBRD TIE 12 (SUTURE) ×1 IMPLANT
SUT SILK 3 0 (SUTURE) ×2
SUT SILK 3-0 18XBRD TIE 12 (SUTURE) ×1 IMPLANT
SUT VIC AB 2-0 CTB1 (SUTURE) ×4 IMPLANT
SUT VIC AB 3-0 SH 27 (SUTURE) ×4
SUT VIC AB 3-0 SH 27X BRD (SUTURE) ×4 IMPLANT
SUT VICRYL 4-0 PS2 18IN ABS (SUTURE) ×8 IMPLANT
TAPE CLOTH SURG 4X10 WHT LF (GAUZE/BANDAGES/DRESSINGS) ×4 IMPLANT
TOWEL OR 17X24 6PK STRL BLUE (TOWEL DISPOSABLE) ×6 IMPLANT
TOWEL OR 17X26 10 PK STRL BLUE (TOWEL DISPOSABLE) ×4 IMPLANT
TRAY FOLEY CATH 14FRSI W/METER (CATHETERS) ×2 IMPLANT
TUBING EXTENTION W/L.L. (IV SETS) ×2 IMPLANT
UNDERPAD 30X30 INCONTINENT (UNDERPADS AND DIAPERS) ×2 IMPLANT
WATER STERILE IRR 1000ML POUR (IV SOLUTION) ×2 IMPLANT

## 2012-03-06 NOTE — Progress Notes (Signed)
VASCULAR PROGRESS NOTE  SUBJECTIVE: Comfortable  PHYSICAL EXAM: Filed Vitals:   03/06/12 1600 03/06/12 1615 03/06/12 1630 03/06/12 1645  BP: 122/71 138/68 132/64 139/69  Pulse:      Temp:      TempSrc:      Resp:      SpO2:       Palpable left DP pulse. Left foot warm  Incisions Ok  LABS: Lab Results  Component Value Date   WBC 7.8 02/29/2012   HGB 12.9* 02/29/2012   HCT 39.8 02/29/2012   MCV 67.9* 02/29/2012   PLT 253 02/29/2012   Lab Results  Component Value Date   CREATININE 1.12 02/29/2012   Lab Results  Component Value Date   INR 0.94 02/29/2012   CBG (last 3)   Recent Labs  03/06/12 0700 03/06/12 1428  GLUCAP 157* 96   ASSESSMENT AND PLAN: 1. Stable post op  Gae Gallop Beeper: [phone] 03/06/2012

## 2012-03-06 NOTE — Interval H&P Note (Signed)
History and Physical Interval Note:  03/06/2012 9:29 AM  Edward [name]  has presented today for surgery, with the diagnosis of Peripheral Vascular Disease with claudication   The various methods of treatment have been discussed with the patient and family. After consideration of risks, benefits and other options for treatment, the patient has consented to  Procedure(s) with comments: BYPASS GRAFT FEMORAL-POPLITEAL ARTERY (Left) - Left Femoral - Below Knee Popliteal Bypass Graft with Vein as a surgical intervention .  The patient's history has been reviewed, patient examined, no change in status, stable for surgery.  I have reviewed the patient's chart and labs.  Questions were answered to the patient's satisfaction.     DICKSON,CHRISTOPHER S

## 2012-03-06 NOTE — Progress Notes (Signed)
Received patient from PACU.  Patient placed on monitor and VVS, bilateral lower extremity pulses dopplerable.  Patient and family orientto unit and routines.  Will continue to monitor.

## 2012-03-06 NOTE — Anesthesia Postprocedure Evaluation (Signed)
Anesthesia Post Note  Patient: Edward [name]  Procedure(s) Performed: Procedure(s) (LRB): BYPASS GRAFT FEMORAL-POPLITEAL ARTERY (Left)  Anesthesia type: General  Patient location: PACU  Post pain: Pain level controlled and Adequate analgesia  Post assessment: Post-op Vital signs reviewed, Patient's Cardiovascular Status Stable, Respiratory Function Stable, Patent Airway and Pain level controlled  Last Vitals:  Filed Vitals:   03/06/12 1500  BP: 144/74  Pulse:   Temp:   Resp:     Post vital signs: Reviewed and stable  Level of consciousness: awake, alert  and oriented  Complications: No apparent anesthesia complications

## 2012-03-06 NOTE — Anesthesia Preprocedure Evaluation (Signed)
Anesthesia Evaluation  Patient identified by MRN, date of birth, ID band Patient awake    Reviewed: Allergy & Precautions, H&P , NPO status , Patient's Chart, lab work & pertinent test results  Airway Mallampati: II  Neck ROM: full    Dental   Pulmonary shortness of breath,          Cardiovascular hypertension, + CAD, + CABG and + Peripheral Vascular Disease + dysrhythmias Atrial Fibrillation + pacemaker     Neuro/Psych    GI/Hepatic GERD-  ,  Endo/Other  diabetes, Type 2  Renal/GU      Musculoskeletal  (+) Arthritis -,   Abdominal   Peds  Hematology   Anesthesia Other Findings   Reproductive/Obstetrics                           Anesthesia Physical Anesthesia Plan  ASA: III  Anesthesia Plan: General   Post-op Pain Management:    Induction: Intravenous  Airway Management Planned: Oral ETT  Additional Equipment:   Intra-op Plan:   Post-operative Plan: Extubation in OR  Informed Consent: I have reviewed the patients History and Physical, chart, labs and discussed the procedure including the risks, benefits and alternatives for the proposed anesthesia with the patient or authorized representative who has indicated his/her understanding and acceptance.     Plan Discussed with: CRNA and Surgeon  Anesthesia Plan Comments:         Anesthesia Quick Evaluation

## 2012-03-06 NOTE — Transfer of Care (Signed)
Immediate Anesthesia Transfer of Care Note  Patient: Edward [name]  Procedure(s) Performed: Procedure(s) with comments: BYPASS GRAFT FEMORAL-POPLITEAL ARTERY (Left) - Left Femoral - Below Knee Popliteal Bypass Graft with Vein and Intraoperative Arteriogram.  Patient Location: PACU  Anesthesia Type:General  Level of Consciousness: awake, alert  and oriented  Airway & Oxygen Therapy: Patient Spontanous Breathing and Patient connected to nasal cannula oxygen  Post-op Assessment: Report given to PACU RN and Post -op Vital signs reviewed and stable  Post vital signs: Reviewed and stable  Complications: No apparent anesthesia complications

## 2012-03-06 NOTE — H&P (View-Only) (Signed)
[facility]  Patient name: Edward [name] MRN: [id number] DOB: 1940-09-28 Sex: male  REASON FOR ADMISSION: progressive disabling claudication of the left lower extremity  HPI: Edward [name] is a 72 y.o. male who I been following with carotid disease and peripheral vascular disease. He underwent a right carotid endarterectomy in March 2011. He has had stable claudication of both lower extremities. However he has developed progressive disabling claudication of the left lower extremity. He experiences pain in the left calf which is brought on by ambulation and relieved with rest. This occurs at less than 2 blocks. He denies rest pain or nonhealing ulcers. He has minimal symptoms on the right side.  He has undergone an arteriogram which shows diffuse superficial femoral artery and popliteal artery occlusive disease. He wishes to pursue revascularization as he was not a candidate for an endovascular approach given the diffuse popliteal disease.  Past Medical History  Diagnosis Date  . A-fib   . HTN (hypertension)   . Diabetes mellitus   . Dyslipidemia   . PVD (peripheral vascular disease)     s/p Right CEA March, 2011,   . Claudication   . Hyperlipidemia   . GERD (gastroesophageal reflux disease)   . Diabetic neuropathy     Family History  Problem Relation Age of Onset  . Coronary artery disease Father   . Hypertension Father   . Heart attack Mother   . Coronary artery disease Mother   . Hypertension Sister   . Anesthesia problems Neg Hx     SOCIAL HISTORY: History  Substance Use Topics  . Smoking status: Former Smoker    Types: Cigarettes    Quit date: 01/16/1979  . Smokeless tobacco: Never Used  . Alcohol Use: No     Comment: OCC.    Allergies  Allergen Reactions  . Lipitor (Atorvastatin Calcium) Other (See Comments)    Reaction unknown    Current Outpatient Prescriptions  Medication Sig Dispense Refill  . amLODipine (NORVASC) 5 MG  tablet Take 1 tablet (5 mg total) by mouth daily.  30 tablet  5  . aspirin 81 MG tablet Take 81 mg by mouth daily.      . Coenzyme Q10 (COQ10) 200 MG CAPS Take 1 capsule by mouth daily.        . Exenatide (BYDUREON) 2 MG SUSR Inject into the skin once a week.      . furosemide (LASIX) 40 MG tablet Take 1 tablet (40 mg total) by mouth daily.  30 tablet  6  . l-methylfolate-B6-B12 (METANX) 3-35-2 MG TABS Take 1 tablet by mouth daily.  60 tablet  5  . loratadine (CLARITIN) 10 MG tablet Take 10 mg by mouth daily.      Marland Kitchen losartan (COZAAR) 50 MG tablet Take 1 tablet (50 mg total) by mouth daily.  90 tablet  3  . Magnesium Oxide 250 MG TABS Take 500 mg by mouth daily.       . metFORMIN (GLUCOPHAGE) 1000 MG tablet Take 1,000 mg by mouth 2 (two) times daily with a meal. 1,500 mg in AM & 1,000 mg at PM.      . metoprolol (LOPRESSOR) 50 MG tablet Take 1 tablet (50 mg total) by mouth 2 (two) times daily.  60 tablet  6  . Multiple Vitamin (MULTIVITAMIN) tablet Take 1 tablet by mouth daily.        Marland Kitchen omeprazole (PRILOSEC OTC) 20 MG tablet Take 20 mg by mouth daily.       Marland Kitchen  Phenylephrine HCl (SUDAFED PE MAXIMUM STRENGTH PO) Take by mouth as needed.      . potassium chloride SA (K-DUR,KLOR-CON) 20 MEQ tablet Take 1 tablet (20 mEq total) by mouth daily.  30 tablet  5  . pravastatin (PRAVACHOL) 80 MG tablet Take 1 tablet (80 mg total) by mouth daily.  30 tablet  5    REVIEW OF SYSTEMS: Valu.Nieves ] denotes positive finding; [  ] denotes negative finding CARDIOVASCULAR:  [ ]  chest pain   [ ]  chest pressure   [ ]  palpitations   [ ]  orthopnea   [ ]  dyspnea on exertion   [ ]  claudication   [ ]  rest pain   [ ]  DVT   [ ]  phlebitis PULMONARY:   [ ]  productive cough   [ ]  asthma   [ ]  wheezing NEUROLOGIC:   [ ]  weakness  [ ]  paresthesias  [ ]  aphasia  [ ]  amaurosis  [ ]  dizziness HEMATOLOGIC:   [ ]  bleeding problems   [ ]  clotting disorders MUSCULOSKELETAL:  [ ]  joint pain   [ ]  joint swelling [ ]  leg  swelling GASTROINTESTINAL: [ ]   blood in stool  [ ]   hematemesis GENITOURINARY:  [ ]   dysuria  [ ]   hematuria PSYCHIATRIC:  [ ]  history of major depression INTEGUMENTARY:  [ ]  rashes  [ ]  ulcers CONSTITUTIONAL:  [ ]  fever   [ ]  chills  PHYSICAL EXAM: Filed Vitals:   02/13/12 1507 02/13/12 1511  BP: 172/89 154/89  Pulse: 83   Height: 5' 10"  (1.778 m)   Weight: 236 lb (107.049 kg)   SpO2: 100%    Body mass index is 33.86 kg/(m^2). GENERAL: The patient is a well-nourished male, in no acute distress. The vital signs are documented above. CARDIOVASCULAR: There is a regular rate and rhythm. I do not detect carotid bruits. He has palpable femoral pulses. I cannot palpate a popliteal or pedal pulses on the left. He has mild bilateral lower extremity swelling. PULMONARY: There is good air exchange bilaterally without wheezing or rales. ABDOMEN: Soft and non-tender with normal pitched bowel sounds.  MUSCULOSKELETAL: There are no major deformities or cyanosis. NEUROLOGIC: No focal weakness or paresthesias are detected. SKIN: There are no ulcers or rashes noted. PSYCHIATRIC: The patient has a normal affect.  DATA:  Lab Results  Component Value Date   WBC 6.8 01/28/2012   HGB 11.5* 01/28/2012   HCT 36.1* 01/28/2012   MCV 67.4* 01/28/2012   PLT 241 01/28/2012   Lab Results  Component Value Date   NA 135 01/28/2012   K 4.8 01/28/2012   CL 101 01/28/2012   CO2 25 01/28/2012   Lab Results  Component Value Date   CREATININE 1.12 01/28/2012   Lab Results  Component Value Date   INR 1.31 06/14/2011   INR 1.00 06/12/2011   INR 1.1* 05/31/2011   Lab Results  Component Value Date   HGBA1C 6.7 01/28/2012   Preoperative vein mapping demonstrates that the left greater saphenous vein appears to have adequate diameters from the saphenofemoral junction to the mid calf.  Carotid duplex scan shows a widely patent right carotid endarterectomy site without evidence of restenosis. There is no significant  stenosis on the left.  His arteriogram shows, on the left side, some eccentric plaque in the left common femoral artery. There is mild diffuse disease of the superficial femoral artery area the above-knee popliteal artery is occluded and there is reconstitution of the below knee  popliteal artery. The patient has diffuse calcific disease. The anterior tibial and posterior tibial arteries are patent.  MEDICAL ISSUES:  Atherosclerosis of native arteries of the extremities with intermittent claudication This patient has progressive disabling claudication of the left lower extremity. He is not a candidate for an endovascular revascularization. He wishes to proceed with left femoropopliteal bypass grafting. I have reviewed the indications for lower extremity bypass. I have also reviewed the potential complications of surgery including but not limited to: wound healing problems, infection, graft thrombosis, limb loss, or other unpredictable medical problems. Given that he has had a previous knee replacement on the left I have explained that he is at increased risk for lymphedema and leg swelling in the left leg. I've also discussed the need for continued long-term follow up of his graft. All the patient's questions were answered and they are agreeable to proceed. His surgery is scheduled for 03/06/2012.  CAROTID ARTERY DISEASE This patient has a widely patent right carotid endarterectomy site with no evidence of carotid stenosis on the left. I have ordered a fall carotid duplex scan in 1 year. The meantime he knows to continue taking his aspirin.   [place] [facility] [place]: [phone]

## 2012-03-06 NOTE — Op Note (Signed)
NAME: Edward [name]   MRN: [id number] DOB: [birth year]    DATE OF OPERATION: 03/06/2012  PREOP DIAGNOSIS: disabling claudication of the left lower extremity  POSTOP DIAGNOSIS: same  PROCEDURE:  1. Left femoral to below knee popliteal artery bypass with reversed translocated saphenous vein graft  2. Endarterectomy of the left common femoral artery (coral reef plaque) 3. Intraoperative arteriogram  SURGEON: Judeth Cornfield. Scot Dock, MD, FACS  ASSIST: Gerri Lins PA  ANESTHESIA: Gen.   EBL: 100 cc  INDICATIONS: Edward [name] is a 72 y.o. male with a known occlusion of his left superficial femoral artery. He presented with progressive disabling claudication of his left lower extremity and wished to undergo revascularization. Given the severe calcific disease and involvement of the popliteal artery, he was not a candidate for an endovascular approach.  FINDINGS: this patient had diffuse disease in the common femoral artery. Endarterectomized a coral reef plaque.  TECHNIQUE: The patient was taken to the operating room and received a general anesthetic. Given his obesity, I elected to make a transverse incision above the inguinal crease. The common femoral artery was dissected free to the level of the inguinal ligament. There was a moderate diffuse disease present I was able to clamp this level. The superficial femoral artery and deep femoral artery were also controlled as were 2 large side branches. The saphenofemoral junction was dissected free and multiple branches were divided between clips and 3-0 silk ties. Using 3 additional incisions along the medial aspect of the left leg, the greater saphenous vein was harvested to the mid calf. Branches were divided between clips and 3-0 silk ties. The distal incision, the below knee popliteal artery was exposed after retracting the gastrocnemius muscle posteriorly. The artery was soft at this level and patent. The vein was ligated distally and then  removed from its bed. A tunnel was created from the below-knee incision to the groin incision and then the patient was heparinized.  Next, the saphenofemoral junction was clamped and the saphenous vein excised from the femoral vein. Femoral vein was oversewn with a 5-0 Prolene suture. Next the proximal valve in the saphenous vein was excised. The vein was opened longitudinally further. The common femoral, superficial femoral, and deep femoral arteries were controlled. Longitudinal arteriotomy was made in the common femoral artery. There was a large coral reef plaque in the posterior aspect of the common femoral artery which I felt should be removed. Felt that this could be a source for embolization if it was not removed. Next the saphenous vein was sewn non-reversed fashion end to side to the common femoral artery using continuous 5-0 Prolene suture. Prior to completing the anastomosis the artery with was flushed and backbled appropriately. A stenosis was completed. A cardiac marker ring was placed around the proximal anastomosis. Next using a retrograde [place] valvulotome the valves were sharply excised. Excellent flows established through the graft. It was marked prevent twisting. He was then brought to the previously created tunnel anastomosis to the below knee popliteal artery.  The tourniquet was placed on the upper thigh. The leg was exsanguinated with an Esmarch bandage. Tourniquet was inflated to 300 mm of mercury. The tourniquet control, a longitudinal arteriotomy was made in the below knee popliteal artery. The vein was cut to the appropriate length, spatulated, and sewn end-to-side to the below knee popliteal artery using continuous 6-0 Prolene suture. Prior to completing this anastomosis the tourniquet was released. The arteries were backbled and flushed appropriately and the anastomosis completed. Was  reestablished to the left foot. There was a good posterior tibial and anterior tibial signal at the  completion. Her operative arteriogram was obtained. Visualization the pop 2 artery was limited because of the knee prosthesis.  The heparin was partially reversed with protamine. Hemostasis was obtained in the wound. The groin incision was closed with 2 deep layers of 3-0 Vicryl and the skin closed with 4-0 Vicryl. The incisions were closed with 2 deep layers of 3-0 Vicryl and the skin closed with 4-0 Vicryl. Dermabond was applied. Patient tolerated the procedure well and was transferred to the recovery room in stable condition. All needle and sponge counts were correct.  Deitra Mayo, MD, FACS [facility]  DATE OF DICTATION:   03/06/2012

## 2012-03-06 NOTE — Preoperative (Signed)
Beta Blockers   Reason not to administer Beta Blockers:took lopressor today

## 2012-03-07 ENCOUNTER — Encounter (HOSPITAL_COMMUNITY): Payer: Self-pay | Admitting: Vascular Surgery

## 2012-03-07 DIAGNOSIS — I739 Peripheral vascular disease, unspecified: Secondary | ICD-10-CM

## 2012-03-07 LAB — CBC
MCH: 21.8 pg — ABNORMAL LOW (ref 26.0–34.0)
MCV: 69.2 fL — ABNORMAL LOW (ref 78.0–100.0)
Platelets: 254 10*3/uL (ref 150–400)
RDW: 18.7 % — ABNORMAL HIGH (ref 11.5–15.5)
WBC: 12.1 10*3/uL — ABNORMAL HIGH (ref 4.0–10.5)

## 2012-03-07 LAB — GLUCOSE, CAPILLARY
Glucose-Capillary: 159 mg/dL — ABNORMAL HIGH (ref 70–99)
Glucose-Capillary: 153 mg/dL — ABNORMAL HIGH (ref 70–99)
Glucose-Capillary: 136 mg/dL — ABNORMAL HIGH (ref 70–99)
Glucose-Capillary: 124 mg/dL — ABNORMAL HIGH (ref 70–99)

## 2012-03-07 LAB — HEMOGLOBIN A1C
Hgb A1c MFr Bld: 7.5 % — ABNORMAL HIGH (ref <5.7)
Mean Plasma Glucose: 169 mg/dL — ABNORMAL HIGH (ref <117)

## 2012-03-07 LAB — BASIC METABOLIC PANEL
Calcium: 8.3 mg/dL — ABNORMAL LOW (ref 8.4–10.5)
Creatinine, Ser: 1.15 mg/dL (ref 0.50–1.35)
GFR calc Af Amer: 73 mL/min — ABNORMAL LOW (ref >90)
Sodium: 135 mEq/L (ref 135–145)

## 2012-03-07 NOTE — Progress Notes (Signed)
VASCULAR LAB PRELIMINARY  ARTERIAL  ABI completed:    RIGHT    LEFT    PRESSURE WAVEFORM  PRESSURE WAVEFORM  BRACHIAL 151 Triphasic Brachail 146 Triphasic  DP 161 Biphasic DP 126 Triphasic         PT 179 Triphasic PT 141 Triphasic    RIGHT LEFT  ABI 1.19 0.93   ABIs and Doppler waveforms are within normal limits bilaterally post operative at rest.  Anamari Galeas, RVS 03/07/2012, 3:59 PM

## 2012-03-07 NOTE — Progress Notes (Signed)
Report called to [facility], receiving RN on 2000.  Pt transferred to 2038 via wheelchair with all belongings. Pt's wife aware of transfer.   Vista Lawman, RN

## 2012-03-07 NOTE — Evaluation (Signed)
Physical Therapy Evaluation Patient Details Name: Edward [name] MRN: [id number] DOB: 1940/09/23 Today's Date: 03/07/2012 Time: 4235-3614 PT Time Calculation (min): 32 min  PT Assessment / Plan / Recommendation Clinical Impression  72 yo adm for LLE fem-pop bypass graft. Pt with decr independence and safety with mobility due to LLE pain and decr knowledge of use of DME. Will benefit from PT to facilitate d/c home with wife's support.    PT Assessment  Patient needs continued PT services    Follow Up Recommendations  Home health PT;Supervision for mobility/OOB (vs no follow-up depending on progress with stairs)    Does the patient have the potential to tolerate intense rehabilitation      Barriers to Discharge None      Equipment Recommendations  None recommended by PT    Recommendations for Other Services     Frequency Min 3X/week    Precautions / Restrictions Precautions Precautions: Fall   Pertinent Vitals/Pain LLE 2/10 at rest; unrated with activity; was pre-medicated; repositioned      Mobility  Bed Mobility Bed Mobility: Rolling Right;Right Sidelying to Sit;Sitting - Scoot to Edge of Bed Rolling Right: 6: Modified independent (Device/Increase time);With rail Right Sidelying to Sit: 6: Modified independent (Device/Increase time);HOB flat;With rails Sitting - Scoot to Edge of Bed: 6: Modified independent (Device/Increase time) Details for Bed Mobility Assistance: Removed rail, yet pt grabbed armrest of nearby chairt to use for support Transfers Transfers: Sit to Stand;Stand to Sit Sit to Stand: 4: Min guard;With upper extremity assist;From bed Stand to Sit: 4: Min guard;With upper extremity assist;With armrests;To chair/3-in-1 Details for Transfer Assistance: guarding assist due to pt's slow progress and near need for assist; vc for sequencing and technique to limit LLE pain Ambulation/Gait Ambulation/Gait Assistance: 4: Min guard;5: Supervision Ambulation  Distance (Feet): 40 Feet Assistive device: Rolling walker Ambulation/Gait Assistance Details: antalgic; vc for proper/safe use of RW and sequencing Gait Pattern: Step-to pattern    Exercises General Exercises - Lower Extremity Ankle Circles/Pumps: AROM;Left;10 reps;Supine Quad Sets: AROM;Left;5 reps;Supine   PT Diagnosis: Difficulty walking;Acute pain  PT Problem List: Decreased range of motion;Decreased activity tolerance;Decreased balance;Decreased mobility;Decreased knowledge of use of DME;Pain PT Treatment Interventions: DME instruction;Gait training;Stair training;Functional mobility training;Therapeutic activities;Therapeutic exercise;Patient/family education   PT Goals Acute Rehab PT Goals PT Goal Formulation: With patient Time For Goal Achievement: 03/10/12 Potential to Achieve Goals: Good Pt will go Supine/Side to Sit: with modified independence;with HOB 0 degrees PT Goal: Supine/Side to Sit - Progress: Goal set today Pt will go Sit to Supine/Side: with modified independence;with HOB 0 degrees PT Goal: Sit to Supine/Side - Progress: Goal set today Pt will go Sit to Stand: with supervision PT Goal: Sit to Stand - Progress: Goal set today Pt will Ambulate: 51 - 150 feet;with supervision;with least restrictive assistive device PT Goal: Ambulate - Progress: Goal set today Pt will Go Up / Down Stairs: 3-5 stairs;with min assist;with rail(s);with least restrictive assistive device PT Goal: Up/Down Stairs - Progress: Goal set today Pt will Perform Home Exercise Program: Independently (for incr ROM RLE to decr edema and incr mobility) PT Goal: Perform Home Exercise Program - Progress: Goal set today  Visit Information  Last PT Received On: 03/07/12 Assistance Needed: +1 PT/OT Co-Evaluation/Treatment: Yes    Subjective Data  Subjective: reports he works from upstairs (at home) Patient Stated Goal: be able to get upstairs to office   Prior Functioning  Home Living Lives With:  Spouse Type of Home: House Home Access: Stairs to  enter Entrance Stairs-Number of Steps: 4 Entrance Stairs-Rails: Right Home Layout: Two level;Able to live on main level with bedroom/bathroom Alternate Level Stairs-Rails: Can reach both Bathroom Shower/Tub: Walk-in shower;Door [employer] Toilet: Standard Bathroom Accessibility: Yes How Accessible: Accessible via walker Home Adaptive Equipment: Walker - rolling Prior Function Level of Independence: Independent Able to Take Stairs?: Yes Driving: Yes Vocation: Retired Corporate investment banker: No difficulties Dominant Hand: Right    Cognition  Cognition Overall Cognitive Status: Appears within functional limits for tasks assessed/performed Arousal/Alertness: Awake/alert Orientation Level: Appears intact for tasks assessed Behavior During Session: [facility] for tasks performed    Extremity/Trunk Assessment Right Upper Extremity Assessment RUE ROM/Strength/Tone: Within functional levels Left Upper Extremity Assessment LUE ROM/Strength/Tone: Within functional levels Right Lower Extremity Assessment RLE ROM/Strength/Tone: Within functional levels Left Lower Extremity Assessment LLE ROM/Strength/Tone: Deficits;Due to pain LLE ROM/Strength/Tone Deficits: Hip flexion to 80 with sitting; knee flexion 50; at least 3/5 throughout Trunk Assessment Trunk Assessment: Normal   Balance Balance Balance Assessed: Yes Static Sitting Balance Static Sitting - Balance Support: Bilateral upper extremity supported;Feet supported Static Sitting - Level of Assistance: 6: Modified independent (Device/Increase time) Dynamic Standing Balance Dynamic Standing - Balance Support: No upper extremity supported;During functional activity Dynamic Standing - Level of Assistance: 5: Stand by assistance Dynamic Standing - Comments: washing hands at sink; reaching for items  End of Session PT - End of Session Activity Tolerance: Patient limited by pain Patient  left: in chair;with call bell/phone within reach;with nursing in room;with family/visitor present Nurse Communication: Mobility status  GP     Edward [name] 03/07/2012, 1:01 PM Pager [phone]

## 2012-03-07 NOTE — Evaluation (Signed)
Occupational Therapy Evaluation Patient Details Name: Edward [name] MRN: [id number] DOB: [birth year] Today's Date: 03/07/2012 Time: 3893-7342 OT Time Calculation (min): 32 min  OT Assessment / Plan / Recommendation Clinical Impression  72 yo male s/p LLE fem-pop bypass graft that could benefit from skilled OT acutely.  Recommend HHOT for d/c planning    OT Assessment  Patient needs continued OT Services    Follow Up Recommendations  Home health OT    Barriers to Discharge      Equipment Recommendations  None recommended by OT    Recommendations for Other Services    Frequency  Min 2X/week    Precautions / Restrictions Precautions Precautions: Fall Precaution Comments: currently with x2 JP drains lt groinin   Pertinent Vitals/Pain Report pain at [place] groining area    ADL  Eating/Feeding: Modified independent Where Assessed - Eating/Feeding: Chair Grooming: Wash/dry hands;Supervision/safety Where Assessed - Grooming: Unsupported standing Lower Body Dressing: +1 Total assistance Where Assessed - Lower Body Dressing: Supported sitting Toilet Transfer: Magazine features editor Method: Other (comment) (standing ) Toilet Transfer Equipment: Regular height toilet (standing to void bladder) Toileting - Clothing Manipulation and Hygiene: Min guard Where Assessed - Camera operator Manipulation and Hygiene: Standing Equipment Used: Rolling walker;Gait belt Transfers/Ambulation Related to ADLs: Pt ambulating supervision level    OT Diagnosis: Acute pain  OT Problem List: Decreased strength;Decreased activity tolerance;Impaired balance (sitting and/or standing);Decreased safety awareness;Decreased knowledge of use of DME or AE;Decreased knowledge of precautions;Pain OT Treatment Interventions: Self-care/ADL training;Therapeutic exercise;DME and/or AE instruction;Therapeutic activities;Balance training;Patient/family education   OT Goals Acute Rehab OT Goals OT Goal  Formulation: With patient Time For Goal Achievement: 03/21/12 Potential to Achieve Goals: Good ADL Goals Pt Will Perform Lower Body Bathing: with modified independence;Sit to stand from chair;with adaptive equipment ADL Goal: Lower Body Bathing - Progress: Goal set today Pt Will Perform Lower Body Dressing: with modified independence;Sit to stand from chair;with adaptive equipment ADL Goal: Lower Body Dressing - Progress: Goal set today Pt Will Transfer to Toilet: with modified independence;Ambulation;3-in-1 ADL Goal: Toilet Transfer - Progress: Goal set today Pt Will Perform Toileting - Clothing Manipulation: with modified independence;Sitting on 3-in-1 or toilet ADL Goal: Toileting - Clothing Manipulation - Progress: Goal set today Pt Will Perform Toileting - Hygiene: with modified independence;Sit to stand from 3-in-1/toilet ADL Goal: Toileting - Hygiene - Progress: Goal set today  Visit Information  Last OT Received On: 03/07/12 Assistance Needed: +1 PT/OT Co-Evaluation/Treatment: Yes    Subjective Data  Subjective: "Physical therapist told me to keep running when I told her that my calf hurt" Patient Stated Goal: to return home   Prior [place] Lives With: Spouse Type of Home: House Home Access: Stairs to enter [employer] of Steps: 4 Entrance Stairs-Rails: Right Home Layout: Two level;Able to live on main level with bedroom/bathroom Alternate Level Stairs-Rails: Can reach both Bathroom Shower/Tub: Walk-in shower;Door [employer] Toilet: Standard Bathroom Accessibility: Yes How Accessible: Accessible via walker Home Adaptive Equipment: Walker - rolling Prior Function Level of Independence: Independent Able to Take Stairs?: Yes Driving: Yes Vocation: Retired Corporate investment banker: No difficulties Dominant Hand: Right         Vision/Perception Vision - History Baseline Vision: Wears glasses only for reading Patient Visual  Report: No change from baseline   Cognition  Cognition Overall Cognitive Status: Appears within functional limits for tasks assessed/performed Arousal/Alertness: Awake/alert Orientation Level: Appears intact for tasks assessed Behavior During Session: [facility] for tasks performed  Extremity/Trunk Assessment Right Upper Extremity Assessment RUE ROM/Strength/Tone: Within functional levels Left Upper Extremity Assessment LUE ROM/Strength/Tone: Within functional levels Right Lower Extremity Assessment RLE ROM/Strength/Tone: Within functional levels Left Lower Extremity Assessment LLE ROM/Strength/Tone: Deficits;Due to pain LLE ROM/Strength/Tone Deficits: Hip flexion to 80 with sitting; knee flexion 50; at least 3/5 throughout Trunk Assessment Trunk Assessment: Normal     Mobility Bed Mobility Bed Mobility: Rolling Right;Right Sidelying to Sit;Sitting - Scoot to Edge of Bed Rolling Right: 6: Modified independent (Device/Increase time);With rail Right Sidelying to Sit: 6: Modified independent (Device/Increase time);HOB flat;With rails Sitting - Scoot to Edge of Bed: 6: Modified independent (Device/Increase time) Details for Bed Mobility Assistance: Removed rail, yet pt grabbed armrest of nearby chairt to use for support Transfers Sit to Stand: 4: Min guard;With upper extremity assist;From bed Stand to Sit: 4: Min guard;With upper extremity assist;With armrests;To chair/3-in-1 Details for Transfer Assistance: guarding assist due to pt's slow progress and near need for assist; vc for sequencing and technique to limit LLE pain     Exercise General Exercises - Lower Extremity Ankle Circles/Pumps: AROM;Left;10 reps;Supine Quad Sets: AROM;Left;5 reps;Supine   Balance Balance Balance Assessed: Yes Static Sitting Balance Static Sitting - Balance Support: Bilateral upper extremity supported;Feet supported Static Sitting - Level of Assistance: 6: Modified independent (Device/Increase  time) Dynamic Standing Balance Dynamic Standing - Balance Support: No upper extremity supported;During functional activity Dynamic Standing - Level of Assistance: 5: Stand by assistance Dynamic Standing - Comments: grooming task at sink   End of Session OT - End of Session Activity Tolerance: Patient tolerated treatment well Patient left: in chair;with call bell/phone within reach Nurse Communication: Mobility status  GO     Sharol Harness [facility] 03/07/2012, 1:19 PM Pager: [phone]

## 2012-03-07 NOTE — Progress Notes (Addendum)
03/07/2012 1800 Nursing notes Upon pt. Returning from bathroom, noted pt. HR >160. Pt. Asymptomatic. Pt. Assisted back to chair. HR lowered to 86. 2L 02 [place] applied. Pacemaker appeared possibly not sensing properly on telemetry. Dr. Trula Slade paged and made aware. Ekg performed. Received callback from Dr. Trula Slade, pacer to be interrogated this evening. Pt. Up dated on plan. Will continue to closely monitor patient.  Kylle Lall, [place]ville Lime

## 2012-03-07 NOTE — Progress Notes (Signed)
VASCULAR PROGRESS NOTE  SUBJECTIVE: pain adequately controlled.  PHYSICAL EXAM: Filed Vitals:   03/06/12 1830 03/06/12 2000 03/07/12 0004 03/07/12 0431  BP: 142/84 150/69 148/69 133/65  Pulse: 80 80 86 84  Temp: 98.2 F (36.8 C) 97.6 F (36.4 C) 98.4 F (36.9 C) 98 F (36.7 C)  TempSrc: Oral Oral Oral Oral  Resp: 16 11  13   Height: 5' 9"  (1.753 m)     Weight: 243 lb 9.7 oz (110.5 kg)     SpO2: 97% 97% 93% 93%   Palpable left dorsalis pedis pulse. Incisions look fine. Each drain put out 50 cc for one shift.  LABS: Lab Results  Component Value Date   WBC 12.1* 03/07/2012   HGB 10.4* 03/07/2012   HCT 33.0* 03/07/2012   MCV 69.2* 03/07/2012   PLT 254 03/07/2012   Lab Results  Component Value Date   CREATININE 1.15 03/07/2012   Lab Results  Component Value Date   INR 0.94 02/29/2012   CBG (last 3)   Recent Labs  03/06/12 1428 03/06/12 1838 03/06/12 2132  GLUCAP 96 115* 117*    ASSESSMENT AND PLAN:  * 1 Day Post-Op s/p: left femoral to below knee popliteal artery bypass with vein graft.  * Diabetes Management: CBG's under good control. Continue his home medications.  * GU: Foley was removed this morning.  * ID: prophylactic antibiotics will be discontinued after second dose.   * DVT prophylaxis: on Lovenox.  * Discontinue JP drains when the drainage is less than 30 cc for 8 hours.   *  Transfer to 2000. Ambulate with assistance.  * Disposition: plan discharge to home when pain control on pand patient ambulating. Anticipate discharge Sunday or Monday.    Gae Gallop Beeper [phone] 03/07/2012

## 2012-03-08 LAB — GLUCOSE, CAPILLARY: Glucose-Capillary: 143 mg/dL — ABNORMAL HIGH (ref 70–99)

## 2012-03-08 NOTE — Progress Notes (Signed)
JP drain # 1 d/c'ed per MD order and protocol. Pt tolerated activity well. Dry dressing applied. Pt resting in bed. Wife at bedside. Will continue to monitor.

## 2012-03-08 NOTE — Progress Notes (Addendum)
[facility] Progress Note  03/08/2012 8:53 AM POD 2  Subjective:  No complaints  Afebrile VSS  Filed Vitals:   03/08/12 0531  BP: 146/69  Pulse: 85  Temp: 98.9 F (37.2 C)  Resp: 16    Physical Exam: Incisions:  C/d/i Extremities:  Left foot warm and well perfused.  + palpable DP left  CBC    Component Value Date/Time   WBC 12.1* 03/07/2012 0540   WBC 11.5* 02/24/2012 1439   RBC 4.77 03/07/2012 0540   RBC 5.78 02/24/2012 1439   HGB 10.4* 03/07/2012 0540   HGB 12.6* 02/24/2012 1439   HCT 33.0* 03/07/2012 0540   HCT 40.1* 02/24/2012 1439   PLT 254 03/07/2012 0540   MCV 69.2* 03/07/2012 0540   MCV 69.4* 02/24/2012 1439   MCH 21.8* 03/07/2012 0540   MCH 21.8* 02/24/2012 1439   MCHC 31.5 03/07/2012 0540   MCHC 31.4* 02/24/2012 1439   RDW 18.7* 03/07/2012 0540   LYMPHSABS 1.1 05/31/2011 1441   MONOABS 1.0 05/31/2011 1441   EOSABS 0.4 05/31/2011 1441   BASOSABS 0.1 05/31/2011 1441    BMET    Component Value Date/Time   NA 135 03/07/2012 0540   K 4.2 03/07/2012 0540   CL 100 03/07/2012 0540   CO2 26 03/07/2012 0540   GLUCOSE 134* 03/07/2012 0540   BUN 16 03/07/2012 0540   CREATININE 1.15 03/07/2012 0540   CREATININE 1.12 01/28/2012 1033   CALCIUM 8.3* 03/07/2012 0540   GFRNONAA 63* 03/07/2012 0540   GFRAA 73* 03/07/2012 0540    INR    Component Value Date/Time   INR 0.94 02/29/2012 0937     Intake/Output Summary (Last 24 hours) at 03/08/12 0853 Last data filed at 03/08/12 0553  Gross per 24 hour  Intake    240 ml  Output     95 ml  Net    145 ml   ABI's 03/07/12:  RIGHT    LEFT     PRESSURE  WAVEFORM   PRESSURE  WAVEFORM   BRACHIAL  151  Triphasic  Brachail  146  Triphasic   DP  161  Biphasic  DP  126  Triphasic          PT  179  Triphasic  PT  141  Triphasic     RIGHT  LEFT   ABI  1.19  0.93      Assessment/Plan:  72 y.o. male is s/p:  1. Left femoral to below knee popliteal artery bypass with reversed translocated saphenous vein graft  2.  Endarterectomy of the left common femoral artery (coral reef plaque)  3. Intraoperative arteriogram   POD 2  -pt doing well this am -PPM interrogated last pm and no changes at this time -continue to increase mobilization.  Will need PT to work with him on stairs as he has stairs at home -possibly home tomorrow or Monday  Samantha Rhyne, [place] [facility] [phone] 03/08/2012 8:53 AM    I agree with the above. The patient has been seen and examined. His incisions are healing nicely. He has 1-2+ edema in the left leg. He has a palpable dorsalis pedis pulse. He has 2 JP drains in place. I will remove #1 which put out 25 cc overnight. Drain #2 put out 65 cc overnight I will leave this and evaluated in the morning. He will continue with ambulation. I told him to walk 3 times a day in the hallways and to attempt  climbing up stairs. He could potentially be ready for discharge tomorrow  Annamarie Major

## 2012-03-08 NOTE — Progress Notes (Signed)
Pt ambulated in hallway 200 ft with rolling walker and tolerated activity well. Will continue to monitor

## 2012-03-08 NOTE — Progress Notes (Signed)
Pt ambulated with rolling walker and one assist in hallway 150 ft and tolerated activity well. Will continue to monitor.

## 2012-03-09 LAB — GLUCOSE, CAPILLARY

## 2012-03-09 MED ORDER — OXYCODONE HCL 5 MG PO TABS: 5.0000 mg | ORAL_TABLET | Freq: Four times a day (QID) | ORAL | Status: DC | PRN

## 2012-03-09 NOTE — Progress Notes (Signed)
Physical Therapy Treatment Patient Details Name: Edward [name] MRN: [id number] DOB: Mar 03, 1940 Today's Date: 03/09/2012 Time: 7353-2992 PT Time Calculation (min): 24 min  PT Assessment / Plan / Recommendation Comments on Treatment Session  Patient s/p fem-pop bypass graft LLE.  Improved mobility - did well with ambulation and on stairs.  No f/u PT needed.  Patient to be discharged home today.    Follow Up Recommendations  No PT follow up;Supervision - Intermittent     Does the patient have the potential to tolerate intense rehabilitation     Barriers to Discharge        Equipment Recommendations  None recommended by PT    Recommendations for Other Services    Frequency Min 3X/week   Plan Discharge plan needs to be updated    Precautions / Restrictions Precautions Precautions: None Restrictions Weight Bearing Restrictions: No   Pertinent Vitals/Pain     Mobility  Bed Mobility Bed Mobility: Not assessed Transfers Transfers: Sit to Stand;Stand to Sit Sit to Stand: 7: Independent;With upper extremity assist;With armrests;From chair/3-in-1 Stand to Sit: 7: Independent;With upper extremity assist;With armrests;To chair/3-in-1 Details for Transfer Assistance: No cues needed Ambulation/Gait Ambulation/Gait Assistance: 5: Supervision Ambulation Distance (Feet): 220 Feet Assistive device: None Ambulation/Gait Assistance Details: Patient with antalgic gait - declined use of RW.  Gait Pattern: Step-through pattern;Decreased stride length;Antalgic Gait velocity: Slow gait speed Stairs: Yes Stairs Assistance: 5: Supervision Stairs Assistance Details (indicate cue type and reason): Instructed patient on safe gait sequence. Stair Management Technique: One rail Right;Step to pattern;Forwards Number of Stairs: 10      PT Goals Acute Rehab PT Goals PT Goal: Sit to Stand - Progress: Met PT Goal: Ambulate - Progress: Met PT Goal: Up/Down Stairs - Progress: Met  Visit  Information  Last PT Received On: 03/09/12 Assistance Needed: +1    Subjective Data  Subjective: "I've got a lot of stairs"   Cognition  Cognition Overall Cognitive Status: Appears within functional limits for tasks assessed/performed Arousal/Alertness: Awake/alert Orientation Level: Appears intact for tasks assessed Behavior During Session: Edward [name] for tasks performed    Balance     End of Session PT - End of Session Equipment Utilized During Treatment: Gait belt Activity Tolerance: Patient tolerated treatment well Patient left: in chair;with call bell/phone within reach;with family/visitor present Nurse Communication: Mobility status;Patient requests pain meds   GP     Despina Pole 03/09/2012, 8:40 AM Carita Pian. Sanjuana Kava, [place] Pager [phone]

## 2012-03-09 NOTE — Discharge Summary (Signed)
[facility] Discharge Summary  Edward [name] [birth year] 72 y.o. male  [id number]  Admission Date: 03/06/2012  Discharge Date: 03/09/12  Physician: Angelia Mould, MD  Admission Diagnosis: Peripheral Vascular Disease with claudication    HPI:   This is a 72 y.o. male who I been following with carotid disease and peripheral vascular disease. He underwent a right carotid endarterectomy in March 2011. He has had stable claudication of both lower extremities. However he has developed progressive disabling claudication of the left lower extremity. He experiences pain in the left calf which is brought on by ambulation and relieved with rest. This occurs at less than 2 blocks. He denies rest pain or nonhealing ulcers. He has minimal symptoms on the right side.  He has undergone an arteriogram which shows diffuse superficial femoral artery and popliteal artery occlusive disease. He wishes to pursue revascularization as he was not a candidate for an endovascular approach given the diffuse popliteal disease.  Hospital Course:  The patient was admitted to the hospital and taken to the operating room on 03/06/2012 and underwent  1. Left femoral to below knee popliteal artery bypass with reversed translocated saphenous vein graft  2. Endarterectomy of the left common femoral artery (coral reef plaque)  3. Intraoperative arteriogram    The pt tolerated the procedure well and was transported to the PACU in good condition. By POD 1, he was doing well and transferred to 2000.  A DM consult was obtained for management of his medications.  On the night of POD 2, Upon pt. Returning from bathroom, noted pt. HR >160. Pt. Asymptomatic. Pt. Assisted back to chair. HR lowered to 86. 2L 02 [place] applied. Pacemaker appeared possibly not sensing properly on telemetry. Dr. Trula Slade paged and made aware. Ekg performed. Received callback from Dr. Trula Slade, pacer to be interrogated this evening. Pt.  Up dated on plan. Will continue to closely monitor patient.   His PPM was interrogated and there were no changes made.  He continued to work with PT and is discharged home on POD 3.  Post of ABI's are as follows:  RIGHT    LEFT     PRESSURE  WAVEFORM   PRESSURE  WAVEFORM   BRACHIAL  151  Triphasic  Brachail  146  Triphasic   DP  161  Biphasic  DP  126  Triphasic          PT  179  Triphasic  PT  141  Triphasic     RIGHT  LEFT   ABI  1.19  0.93      The remainder of the hospital course consisted of increasing mobilization and increasing intake of solids without difficulty.  CBC    Component Value Date/Time   WBC 12.1* 03/07/2012 0540   WBC 11.5* 02/24/2012 1439   RBC 4.77 03/07/2012 0540   RBC 5.78 02/24/2012 1439   HGB 10.4* 03/07/2012 0540   HGB 12.6* 02/24/2012 1439   HCT 33.0* 03/07/2012 0540   HCT 40.1* 02/24/2012 1439   PLT 254 03/07/2012 0540   MCV 69.2* 03/07/2012 0540   MCV 69.4* 02/24/2012 1439   MCH 21.8* 03/07/2012 0540   MCH 21.8* 02/24/2012 1439   MCHC 31.5 03/07/2012 0540   MCHC 31.4* 02/24/2012 1439   RDW 18.7* 03/07/2012 0540   LYMPHSABS 1.1 05/31/2011 1441   MONOABS 1.0 05/31/2011 1441   EOSABS 0.4 05/31/2011 1441   BASOSABS 0.1 05/31/2011 1441    BMET    Component Value  Date/Time   NA 135 03/07/2012 0540   K 4.2 03/07/2012 0540   CL 100 03/07/2012 0540   CO2 26 03/07/2012 0540   GLUCOSE 134* 03/07/2012 0540   BUN 16 03/07/2012 0540   CREATININE 1.15 03/07/2012 0540   CREATININE 1.12 01/28/2012 1033   CALCIUM 8.3* 03/07/2012 0540   GFRNONAA 63* 03/07/2012 0540   GFRAA 73* 03/07/2012 0540     Discharge Instructions:   The patient is discharged to home with extensive instructions on wound care and progressive ambulation.  They are instructed not to drive or perform any heavy lifting until returning to see the physician in his office.  Discharge Orders   Future Appointments Provider Department Dept Phone   04/28/2012 8:45 AM [name], MD [place]  [phone]   02/18/2013 3:00 PM [facility] [phone]   02/18/2013 4:00 PM Princess Perna, NP [facility] [phone]   Future Orders Complete By Expires     Call MD for:  redness, tenderness, or signs of infection (pain, swelling, bleeding, redness, odor or green/yellow discharge around incision site)  As directed     Call MD for:  severe or increased pain, loss or decreased feeling  in affected limb(s)  As directed     Call MD for:  temperature >100.5  As directed     Discharge wound care:  As directed     Comments:      Shower daily with soap and water starting 03/09/12  Wash the groin wound with soap and water and pat dry.  Then put a dry gauze or washcloth there to keep this area dry.  Do not use Vaseline or neosporin on your incisions.  Only use soap and water on your incisions and then protect and keep dry.    Driving Restrictions  As directed     Comments:      No driving for 2 weeks    Lifting restrictions  As directed     Comments:      No lifting for 6 weeks    Resume previous diet  As directed        Discharge Diagnosis:  Peripheral Vascular Disease with claudication   Secondary Diagnosis: Patient Active Problem List  Diagnosis  . CAROTID ARTERY DISEASE  . PAIN IN SOFT TISSUES OF LIMB  . Atrial fibrillation  . Dyspnea on exertion  . Diabetes mellitus  . Atherosclerosis of native arteries of the extremities with intermittent claudication  . Anemia  . Atherosclerosis of renal artery  . Hyperlipidemia  . LBBB (left bundle branch block)  . HTN (hypertension)  . Sick sinus syndrome  . Pacemaker-Medtronic  . Accelerated junctional rhythm  . Coronary artery disease  . S/P Maze operation for atrial fibrillation  . Peripheral vascular disease, unspecified   Past Medical History  Diagnosis Date  . A-fib   . HTN (hypertension)   . Diabetes mellitus   . Dyslipidemia   . PVD (peripheral  vascular disease)     s/p Right CEA March, 2011,   . Claudication   . Hyperlipidemia   . GERD (gastroesophageal reflux disease)   . Diabetic neuropathy   . Pacemaker   . Stroke     ?  Marland Kitchen Arthritis   . Anemia     ?       Medication List    TAKE these medications       amLODipine 5 MG tablet  Commonly known as:  NORVASC  Take 5 mg by mouth every evening.     aspirin EC 81 MG tablet  Take 81 mg by mouth daily.     BYDUREON 2 MG Susr  Generic drug:  Exenatide  Inject into the skin once a week.     cefdinir 300 MG capsule  Commonly known as:  OMNICEF  Take 300 mg by mouth 2 (two) times daily. Takes for 10 days.  First dose 02/24/2012.     Coenzyme Q10 200 MG capsule  Take 200 mg by mouth every morning.     dextromethorphan-guaiFENesin 30-600 MG per 12 hr tablet  Commonly known as:  MUCINEX DM  Take 1 tablet by mouth every 12 (twelve) hours.     furosemide 40 MG tablet  Commonly known as:  LASIX  Take 40 mg by mouth every morning.     HYDROcodone-homatropine 5-1.5 MG/5ML syrup  Commonly known as:  HYCODAN  TAKE 1 TEASPOON BY MOUTH EVERY 8 HOURS AS NEEDED FOR COUGH     l-methylfolate-B6-B12 3-35-2 MG Tabs  Commonly known as:  METANX  Take 1 tablet by mouth every morning.     loratadine 10 MG tablet  Commonly known as:  CLARITIN  Take 10 mg by mouth daily.     losartan 50 MG tablet  Commonly known as:  COZAAR  Take 50 mg by mouth every morning.     MAGNESIUM PO  Take 500 mg by mouth daily.     metFORMIN 1000 MG tablet  Commonly known as:  GLUCOPHAGE  Take 1,000-1,500 mg by mouth 2 (two) times daily with a meal. Takes 1 tablet (1,035m) every morning and takes 1 tablets (1,5061m every evening.     metoprolol 50 MG tablet  Commonly known as:  LOPRESSOR  Take 50 mg by mouth 2 (two) times daily.     multivitamin with minerals Tabs  Take 1 tablet by mouth daily.     omeprazole 20 MG tablet  Commonly known as:  PRILOSEC OTC  Take 20 mg by mouth every  morning.     oxyCODONE 5 MG immediate release tablet  Commonly known as:  ROXICODONE  Take 1 tablet (5 mg total) by mouth every 6 (six) hours as needed for pain.     oxymetazoline 0.05 % nasal spray  Commonly known as:  AFRIN  Place 2 sprays into the nose daily as needed for congestion.     potassium chloride SA 20 MEQ tablet  Commonly known as:  K-DUR,KLOR-CON  Take 20 mEq by mouth every morning.     pravastatin 80 MG tablet  Commonly known as:  PRAVACHOL  Take 1 tablet (80 mg total) by mouth daily.       roxicodone #30 NR given  Disposition: home  Patient's condition: is Good  Follow up: 1. Dr. DiScot Dockn 2 weeks   SaLeontine LocketPA-C [facility] [phone]/23/2014  7:52 AM

## 2012-03-09 NOTE — Progress Notes (Signed)
Pt discharged per MD order and protocol . All discharge instructions and Rx'es given to patient. Discharge instructions reviewed with patient and wife and questions answered. Pt aware of all follow up appointments.

## 2012-03-09 NOTE — Progress Notes (Signed)
[facility] Progress Note  03/09/2012 7:39 AM POD 3  Subjective:  Ready to go home  Afebrile x 24 hrs VSS 96% RA  Filed Vitals:   03/09/12 0418  BP: 144/70  Pulse: 79  Temp: 98.5 F (36.9 C)  Resp: 19    Physical Exam: Incisions:  All incisions are c/d/i Extremities:  + palpable left DP; left foot warm and well perfused.  CBC    Component Value Date/Time   WBC 12.1* 03/07/2012 0540   WBC 11.5* 02/24/2012 1439   RBC 4.77 03/07/2012 0540   RBC 5.78 02/24/2012 1439   HGB 10.4* 03/07/2012 0540   HGB 12.6* 02/24/2012 1439   HCT 33.0* 03/07/2012 0540   HCT 40.1* 02/24/2012 1439   PLT 254 03/07/2012 0540   MCV 69.2* 03/07/2012 0540   MCV 69.4* 02/24/2012 1439   MCH 21.8* 03/07/2012 0540   MCH 21.8* 02/24/2012 1439   MCHC 31.5 03/07/2012 0540   MCHC 31.4* 02/24/2012 1439   RDW 18.7* 03/07/2012 0540   LYMPHSABS 1.1 05/31/2011 1441   MONOABS 1.0 05/31/2011 1441   EOSABS 0.4 05/31/2011 1441   BASOSABS 0.1 05/31/2011 1441    BMET    Component Value Date/Time   NA 135 03/07/2012 0540   K 4.2 03/07/2012 0540   CL 100 03/07/2012 0540   CO2 26 03/07/2012 0540   GLUCOSE 134* 03/07/2012 0540   BUN 16 03/07/2012 0540   CREATININE 1.15 03/07/2012 0540   CREATININE 1.12 01/28/2012 1033   CALCIUM 8.3* 03/07/2012 0540   GFRNONAA 63* 03/07/2012 0540   GFRAA 73* 03/07/2012 0540    INR    Component Value Date/Time   INR 0.94 02/29/2012 0937     Intake/Output Summary (Last 24 hours) at 03/09/12 0739 Last data filed at 03/08/12 1754  Gross per 24 hour  Intake    480 ml  Output     30 ml  Net    450 ml    Drain #2:  15cc/24hr   Assessment/Plan:  72 y.o. male is s/p:  1. Left femoral to below knee popliteal artery bypass with reversed translocated saphenous vein graft  2. Endarterectomy of the left common femoral artery (coral reef plaque)  3. Intraoperative arteriogram   POD 3  -discontinue 2nd drain -continue mobilization -will get RN/PT to work with pt on steps -possibly  home later today.   Leontine Locket, PA-C [facility] [phone] 03/09/2012 7:39 AM

## 2012-03-10 ENCOUNTER — Telehealth: Payer: Self-pay | Admitting: Vascular Surgery

## 2012-03-10 NOTE — Discharge Summary (Signed)
Agree with plans for discharge.  Gae Gallop Beeper [phone] 03/10/2012

## 2012-03-10 NOTE — Telephone Encounter (Signed)
Received voicemail from Ms Sanmiguel to give Edward [name] a call back regarding a follow up with Dr Scot Dock.   From EPIC notes, patient needs a hospital follow up in two weeks.   Tried to reach patient on # left, no answer. Left message on patients cell number with appt information. dpm

## 2012-03-11 ENCOUNTER — Telehealth: Payer: Self-pay | Admitting: Vascular Surgery

## 2012-03-11 NOTE — Telephone Encounter (Addendum)
03/11/12- patients wife notified of appt, dpm   Message copied by Gena Fray on Tue Mar 11, 2012  9:28 AM ------      Message from: Mena Goes      Created: Tue Mar 11, 2012  7:53 AM      Regarding: schedule                   ----- Message -----         From: Gabriel Earing, PA         Sent: 03/09/2012   7:51 AM           To: Mena Goes, CMA            Pt is s/p       1. Left femoral to below knee popliteal artery bypass with reversed translocated saphenous vein graft        2. Endarterectomy of the left common femoral artery (coral reef plaque)        3. Intraoperative arteriogram            On 03/06/12 by [facility].            F/u with him in 2 weeks.            Thanks,      Aldona Bar ------

## 2012-03-25 ENCOUNTER — Encounter: Payer: Self-pay | Admitting: Vascular Surgery

## 2012-03-26 ENCOUNTER — Ambulatory Visit (INDEPENDENT_AMBULATORY_CARE_PROVIDER_SITE_OTHER): Payer: Federal, State, Local not specified - PPO | Admitting: Vascular Surgery

## 2012-03-26 ENCOUNTER — Encounter: Payer: Self-pay | Admitting: Vascular Surgery

## 2012-03-26 VITALS — BP 161/89 | HR 85 | Ht 69.0 in | Wt 236.0 lb

## 2012-03-26 DIAGNOSIS — Z48812 Encounter for surgical aftercare following surgery on the circulatory system: Secondary | ICD-10-CM

## 2012-03-26 DIAGNOSIS — I739 Peripheral vascular disease, unspecified: Secondary | ICD-10-CM | POA: Insufficient documentation

## 2012-03-26 MED ORDER — OXYCODONE-ACETAMINOPHEN 5-325 MG PO TABS: 1.0000 | ORAL_TABLET | ORAL | Status: DC | PRN

## 2012-03-26 NOTE — Assessment & Plan Note (Signed)
The patient is doing well status post left common femoral artery endarterectomy and left femoral to below knee pop bypass with a vein graft. He does have significant left leg swelling as anticipated secondary to lymphedema. He has had a previous knee replacement on the left with an anterior incision. Discussed the importance of intermittent leg elevation. I'll see him back in 3 months with the ABIs duplex of his graft. He knows to call sooner if he has problems.

## 2012-03-26 NOTE — Addendum Note (Signed)
Addended by: Mena Goes on: 03/26/2012 03:54 PM   Modules accepted: Orders

## 2012-03-26 NOTE — Progress Notes (Signed)
[facility]  Patient name: Edward [name] MRN: [id number] DOB: 1940-01-25 Sex: male  REASON FOR VISIT: follow up after left femoropopliteal bypass graft.  HPI: Edward [name] is a 72 y.o. male who had presented with progressive disabling claudication of the left lower extremity and wished to undergo revascularization. He underwent a endarterectomy of the left common femoral artery and left femoral to below knee popliteal artery bypass with a saphenous vein graft on 03/06/2012. He did well postoperatively he comes in for his first outpatient visit. His only complaint has been leg swelling on the left. He denies significant claudication on the left although he has not been especially active.  REVIEW OF SYSTEMS: Valu.Nieves ] denotes positive finding; [  ] denotes negative finding  CARDIOVASCULAR:  [ ]  chest pain   [ ]  dyspnea on exertion    CONSTITUTIONAL:  [ ]  fever   [ ]  chills  PHYSICAL EXAM: Filed Vitals:   03/26/12 0949  BP: 161/89  Pulse: 85  Height: 5' 9"  (1.753 m)  Weight: 236 lb (107.049 kg)  SpO2: 98%   Body mass index is 34.84 kg/(m^2). GENERAL: The patient is a well-nourished male, in no acute distress. The vital signs are documented above. CARDIOVASCULAR: There is a regular rate and rhythm  PULMONARY: There is good air exchange bilaterally without wheezing or rales. He has a small eschar the wound in the left groin but there is no erythema or drainage. Main incisions are healing nicely. Has a palpable popliteal pulse although somewhat difficult to palpate because of his leg swelling. He has moderate left lower extremity swelling. Left foot is warm and well-perfused.  MEDICAL ISSUES:  PVD (peripheral vascular disease) with claudication The patient is doing well status post left common femoral artery endarterectomy and left femoral to below knee pop bypass with a vein graft. He does have significant left leg swelling as anticipated secondary to  lymphedema. He has had a previous knee replacement on the left with an anterior incision. Discussed the importance of intermittent leg elevation. I'll see him back in 3 months with the ABIs duplex of his graft. He knows to call sooner if he has problems.   [place] [facility] [place] Beeper: [phone]

## 2012-03-28 ENCOUNTER — Encounter: Payer: Self-pay | Admitting: Emergency Medicine

## 2012-04-14 ENCOUNTER — Ambulatory Visit (INDEPENDENT_AMBULATORY_CARE_PROVIDER_SITE_OTHER): Payer: Federal, State, Local not specified - PPO | Admitting: Cardiology

## 2012-04-14 ENCOUNTER — Telehealth: Payer: Self-pay | Admitting: Internal Medicine

## 2012-04-14 ENCOUNTER — Encounter: Payer: Self-pay | Admitting: Cardiology

## 2012-04-14 VITALS — BP 144/98 | HR 85 | Resp 20 | Wt 249.0 lb

## 2012-04-14 DIAGNOSIS — R0602 Shortness of breath: Secondary | ICD-10-CM

## 2012-04-14 DIAGNOSIS — R600 Localized edema: Secondary | ICD-10-CM | POA: Insufficient documentation

## 2012-04-14 DIAGNOSIS — I2510 Atherosclerotic heart disease of native coronary artery without angina pectoris: Secondary | ICD-10-CM

## 2012-04-14 DIAGNOSIS — I10 Essential (primary) hypertension: Secondary | ICD-10-CM

## 2012-04-14 DIAGNOSIS — I4891 Unspecified atrial fibrillation: Secondary | ICD-10-CM

## 2012-04-14 DIAGNOSIS — R609 Edema, unspecified: Secondary | ICD-10-CM

## 2012-04-14 LAB — BASIC METABOLIC PANEL
BUN: 18 mg/dL (ref 6–23)
CO2: 24 mEq/L (ref 19–32)
Calcium: 8.8 mg/dL (ref 8.4–10.5)
Chloride: 98 mEq/L (ref 96–112)
Creatinine, Ser: 1.1 mg/dL (ref 0.4–1.5)
Glucose, Bld: 158 mg/dL — ABNORMAL HIGH (ref 70–99)

## 2012-04-14 MED ORDER — LOSARTAN POTASSIUM 100 MG PO TABS: ORAL_TABLET | ORAL | Status: DC

## 2012-04-14 MED ORDER — FUROSEMIDE 80 MG PO TABS: 80.0000 mg | ORAL_TABLET | Freq: Every day | ORAL | Status: DC

## 2012-04-14 MED ORDER — POTASSIUM CHLORIDE CRYS ER 20 MEQ PO TBCR: EXTENDED_RELEASE_TABLET | ORAL | Status: DC

## 2012-04-14 NOTE — Assessment & Plan Note (Signed)
He has significant fluid retention particularly in his left lower extremity. Some of this may be lymphedema but I think he is volume overloaded. I've increased his Lasix to 80 mg by mouth every morning and his potassium to 40 mEq every morning. We'll check a set of electrolytes today. I'll have him return in 10 days for another set. I've also stopped his Norvasc and increase his losartan to 100 mg a day. Leg elevation emphasized. We'll schedule for followup in a couple weeks here.

## 2012-04-14 NOTE — Telephone Encounter (Signed)
New problem    Pt having sob-no other symptoms

## 2012-04-14 NOTE — Progress Notes (Signed)
Patient presented ambulatory for nurse visit - EKG/O2 check.  Patient able to walk without difficulty; alert & oriented x 4; denies chest discomfort.   After obtaining weight of 249 lb patient states if our scales are correct, then he has gained 13 lbs in one week.  Patient does not check weight daily and does not monitor fluid intake/output.  Patient takes 40 mg Lasix daily.  Breathing does appear slightly labored. Patient speaks in short sentences and states that if he tries to walk very far then he really has difficulty breathing.  Patient does not appear to be in any distress at this time. 12-lead EKG obtained and information taken to Dr. Verl Blalock, DOD. Orders obtained and patient notified of plan of care.  Patient taken to lab for BMET.

## 2012-04-14 NOTE — Patient Instructions (Signed)
You need to have a BMET done today in our lab.  You need to have this test repeated in 10 days prior to follow up appointment.  Your physician wants you to follow-up in: 10 days with Dr. Acie Fredrickson.  Your physician has made the following changes to your medications:  *Increase your Losartan to 100 mg daily (if you have not already done so)  *Increase your Lasix to 80 mg daily  *Increase your KLOR-CON (Potassium) to 40 mEq daily  *STOP taking your Amlodipine

## 2012-04-14 NOTE — Addendum Note (Signed)
Addended by: Emmaline Life on: 04/14/2012 02:16 PM   Modules accepted: Orders

## 2012-04-14 NOTE — Telephone Encounter (Signed)
Spoke with patient who c/o SOB x 1-2 weeks.  Patient states that his breathing was so labored last week that his dentist commented on it.  Patient had lt femoral endarterectomy and lt femoral to below knee pop bypass w/vein graft 2/20.  Patient states his left leg hurts all the time, no noticeable increase of pain in calf area.  Patient denies chest discomfort.  Patient is alert and oriented and speaks clearly.  Reviewed patient's case with Dr. Verl Blalock, DOD who advises to bring patient in for evaluation.  Patient agrees with plan of care and is en route.

## 2012-04-14 NOTE — Progress Notes (Signed)
HPI [name] comes in today with the above complaint. He is status post left lower extremity bypass surgery and has had extensive swelling of his left lower extremity. Dr. Doren Custard evaluated him last week and felt this was lymphedema. He has had previous left knee surgery as well. His weight is up 13 pounds. He also has 2+ edema in the right lower extremity. He denies orthopnea or PND. He's had no chest pain. He takes 40 mg of Lasix each morning. He's also on potassium supplementation.  Past Medical History  Diagnosis Date  . A-fib   . HTN (hypertension)   . Diabetes mellitus   . Dyslipidemia   . PVD (peripheral vascular disease)     s/p Right CEA March, 2011,   . Claudication   . Hyperlipidemia   . GERD (gastroesophageal reflux disease)   . Diabetic neuropathy   . Pacemaker   . Stroke     ?  Marland Kitchen Arthritis   . Anemia     ?    Current Outpatient Prescriptions  Medication Sig Dispense Refill  . aspirin EC 81 MG tablet Take 81 mg by mouth daily.      . cefdinir (OMNICEF) 300 MG capsule Take 300 mg by mouth 2 (two) times daily. Takes for 10 days.  First dose 02/24/2012.      Marland Kitchen Coenzyme Q10 200 MG capsule Take 200 mg by mouth every morning.      Marland Kitchen dextromethorphan-guaiFENesin (MUCINEX DM) 30-600 MG per 12 hr tablet Take 1 tablet by mouth every 12 (twelve) hours.      . Exenatide (BYDUREON) 2 MG SUSR Inject into the skin once a week.      . furosemide (LASIX) 80 MG tablet Take 1 tablet (80 mg total) by mouth daily.  30 tablet  3  . HYDROcodone-homatropine (HYCODAN) 5-1.5 MG/5ML syrup TAKE 1 TEASPOON BY MOUTH EVERY 8 HOURS AS NEEDED FOR COUGH  120 mL  0  . l-methylfolate-B6-B12 (METANX) 3-35-2 MG TABS Take 1 tablet by mouth every morning.      . loratadine (CLARITIN) 10 MG tablet Take 10 mg by mouth daily.      Marland Kitchen losartan (COZAAR) 100 MG tablet Take 1 pill (100 mg) once daily.  30 tablet  3  . MAGNESIUM PO Take 500 mg by mouth daily.      . metFORMIN (GLUCOPHAGE) 1000 MG tablet Take  1,000-1,500 mg by mouth 2 (two) times daily with a meal. Takes 1 tablet (1,04m) every morning and takes 1 tablets (1,5045m every evening.      . metoprolol (LOPRESSOR) 50 MG tablet Take 50 mg by mouth 2 (two) times daily.      . Multiple Vitamin (MULTIVITAMIN WITH MINERALS) TABS Take 1 tablet by mouth daily.      . Marland Kitchenmeprazole (PRILOSEC OTC) 20 MG tablet Take 20 mg by mouth every morning.      . Marland KitchenxyCODONE (ROXICODONE) 5 MG immediate release tablet Take 1 tablet (5 mg total) by mouth every 6 (six) hours as needed for pain.  30 tablet  0  . oxyCODONE-acetaminophen (ROXICET) 5-325 MG per tablet Take 1-2 tablets by mouth every 4 (four) hours as needed for pain.  30 tablet  0  . oxymetazoline (AFRIN) 0.05 % nasal spray Place 2 sprays into the nose daily as needed for congestion.      . potassium chloride SA (K-DUR,KLOR-CON) 20 MEQ tablet Take 2 tabs (40 mEq) daily  30 tablet  3  . pravastatin (PRAVACHOL) 80  MG tablet Take 1 tablet (80 mg total) by mouth daily.  30 tablet  5   No current facility-administered medications for this visit.    Allergies  Allergen Reactions  . Lipitor (Atorvastatin Calcium) Other (See Comments)    Reaction unknown    Family History  Problem Relation Age of Onset  . Coronary artery disease Father   . Hypertension Father   . Heart attack Mother   . Coronary artery disease Mother   . Hypertension Sister   . Anesthesia problems Neg Hx     History   Social History  . Marital Status: Married    Spouse Name: N/A    Number of Children: N/A  . Years of Education: N/A   Occupational History  . Not on file.   Social History Main Topics  . Smoking status: Former Smoker -- 3.00 packs/day for 32 years    Types: Cigarettes    Quit date: 01/16/1979  . Smokeless tobacco: Never Used  . Alcohol Use: 1.2 oz/week    2 Glasses of wine per week     Comment: OCC.  . Drug Use: No  . Sexually Active: Not on file   Other Topics Concern  . Not on file   Social  History Narrative  . No narrative on file    ROS ALL NEGATIVE EXCEPT THOSE NOTED IN HPI  PE  General Appearance: well developed, well nourished in no acute distress, muscular, obese HEENT: symmetrical face, PERRLA, good dentition  Neck: no JVD, thyromegaly, or adenopathy, trachea midline Chest: symmetric without deformity Cardiac: PMI non-displaced, RRR, normal S1, S2, no gallop or murmur Lung: clear to ausculation and percussion Vascular: all pulses full without bruits  Abdominal: nondistended, nontender, good bowel sounds, no HSM, no bruits Extremities: no cyanosis, clubbing, 2+ pitting edema below the knee on the right 3+ on the left, no sign of DVT, no varicosities, saphenous vein graft harvest sites stable on the left. Skin: normal color, no rashes Neuro: alert and oriented x 3, non-focal Pysch: normal affect  EKG AV paced  BMET    Component Value Date/Time   NA 135 03/07/2012 0540   K 4.2 03/07/2012 0540   CL 100 03/07/2012 0540   CO2 26 03/07/2012 0540   GLUCOSE 134* 03/07/2012 0540   BUN 16 03/07/2012 0540   CREATININE 1.15 03/07/2012 0540   CREATININE 1.12 01/28/2012 1033   CALCIUM 8.3* 03/07/2012 0540   GFRNONAA 63* 03/07/2012 0540   GFRAA 73* 03/07/2012 0540    Lipid Panel     Component Value Date/Time   CHOL 121 01/28/2012 1033   TRIG 102 01/28/2012 1033   HDL 42 01/28/2012 1033   CHOLHDL 2.9 01/28/2012 1033   VLDL 20 01/28/2012 1033   LDLCALC 59 01/28/2012 1033    CBC    Component Value Date/Time   WBC 12.1* 03/07/2012 0540   WBC 11.5* 02/24/2012 1439   RBC 4.77 03/07/2012 0540   RBC 5.78 02/24/2012 1439   HGB 10.4* 03/07/2012 0540   HGB 12.6* 02/24/2012 1439   HCT 33.0* 03/07/2012 0540   HCT 40.1* 02/24/2012 1439   PLT 254 03/07/2012 0540   MCV 69.2* 03/07/2012 0540   MCV 69.4* 02/24/2012 1439   MCH 21.8* 03/07/2012 0540   MCH 21.8* 02/24/2012 1439   MCHC 31.5 03/07/2012 0540   MCHC 31.4* 02/24/2012 1439   RDW 18.7* 03/07/2012 0540   LYMPHSABS 1.1 05/31/2011 1441   MONOABS  1.0 05/31/2011 1441   EOSABS 0.4 05/31/2011  1441   BASOSABS 0.1 05/31/2011 1441

## 2012-04-15 ENCOUNTER — Telehealth: Payer: Self-pay | Admitting: *Deleted

## 2012-04-15 NOTE — Telephone Encounter (Signed)
msg left at home number with date and time, asked him to call if any problem.

## 2012-04-15 NOTE — Telephone Encounter (Signed)
Message copied by Jonathon Jordan on Tue Apr 15, 2012  2:06 PM ------      Message from: [name]      Created: Mon Apr 14, 2012  3:42 PM      Regarding: RE: Follow up       If we can arrange for me to see some patients between 8-9 on Wednesday, April 9, he could be seen then.  Will need to see if my nurse ( or another nurse) is available at that time.                   ----- Message -----         From: Lanice Schwab Swinyer         Sent: 04/14/2012  12:19 PM           To: [name], MD, Jonathon Jordan, RN      Subject: Follow up                                                Patient seen in office by Dr. Verl Blalock, DOD 3/31 with SOB, weight gain of 13 lbs.  BMET obtained and Lasix and KCL increased and patient instructed to f/u in 10 days with Dr. Acie Fredrickson and repeat BMET.  There are no appointments available.  Please advise.       ------

## 2012-04-15 NOTE — Telephone Encounter (Signed)
UNABLE TO GET THROUGH ON HOME NUMBER, LEFT MSG ON BOTH CELL NUMBERS, APP WAS SET, THEY NEED TO BE GIVEN THE DATE AND TIME.

## 2012-04-23 ENCOUNTER — Ambulatory Visit (INDEPENDENT_AMBULATORY_CARE_PROVIDER_SITE_OTHER): Payer: Federal, State, Local not specified - PPO | Admitting: Vascular Surgery

## 2012-04-23 ENCOUNTER — Encounter: Payer: Self-pay | Admitting: Vascular Surgery

## 2012-04-23 ENCOUNTER — Encounter: Payer: Self-pay | Admitting: Cardiovascular Disease

## 2012-04-23 ENCOUNTER — Encounter (INDEPENDENT_AMBULATORY_CARE_PROVIDER_SITE_OTHER): Payer: Federal, State, Local not specified - PPO | Admitting: *Deleted

## 2012-04-23 ENCOUNTER — Ambulatory Visit (INDEPENDENT_AMBULATORY_CARE_PROVIDER_SITE_OTHER): Payer: Federal, State, Local not specified - PPO | Admitting: Cardiovascular Disease

## 2012-04-23 ENCOUNTER — Other Ambulatory Visit: Payer: Self-pay | Admitting: *Deleted

## 2012-04-23 VITALS — BP 160/90 | HR 78 | Ht 69.0 in | Wt 243.0 lb

## 2012-04-23 VITALS — BP 156/91 | HR 86 | Ht 69.0 in | Wt 243.0 lb

## 2012-04-23 DIAGNOSIS — I10 Essential (primary) hypertension: Secondary | ICD-10-CM

## 2012-04-23 DIAGNOSIS — I5032 Chronic diastolic (congestive) heart failure: Secondary | ICD-10-CM | POA: Insufficient documentation

## 2012-04-23 DIAGNOSIS — M7989 Other specified soft tissue disorders: Secondary | ICD-10-CM

## 2012-04-23 DIAGNOSIS — I5020 Unspecified systolic (congestive) heart failure: Secondary | ICD-10-CM

## 2012-04-23 DIAGNOSIS — I739 Peripheral vascular disease, unspecified: Secondary | ICD-10-CM

## 2012-04-23 HISTORY — DX: Chronic diastolic (congestive) heart failure: I50.32

## 2012-04-23 LAB — BASIC METABOLIC PANEL
BUN: 18 mg/dL (ref 6–23)
Creatinine, Ser: 1.1 mg/dL (ref 0.4–1.5)
GFR: 69.63 mL/min (ref >60.00)
Glucose, Bld: 146 mg/dL — ABNORMAL HIGH (ref 70–99)
Potassium: 4.3 mEq/L (ref 3.5–5.1)

## 2012-04-23 LAB — BRAIN NATRIURETIC PEPTIDE: Pro B Natriuretic peptide (BNP): 45 pg/mL (ref 0.0–100.0)

## 2012-04-23 NOTE — Assessment & Plan Note (Signed)
His blood pressure is elevated today. I suspect this is because he did not sleep last night.

## 2012-04-23 NOTE — Patient Instructions (Addendum)
Will obtain labs today and call you with the results (bmet/bnp)  Your physician has requested that you have an echocardiogram. Echocardiography is a painless test that uses sound waves to create images of your heart. It provides your doctor with information about the size and shape of your heart and how well your heart's chambers and valves are working. This procedure takes approximately one hour. There are no restrictions for this procedure.  [facility] will see you today, go there when you leave the office. You are a work in appointment but they will get you back as soon as they can.   Your physician recommends that you schedule a follow-up appointment in: 6 weeks

## 2012-04-23 NOTE — Progress Notes (Addendum)
[facility] [place]  Established Previous Bypass  History of Present Illness  Edward [name] is a 72 y.o. ([birth year]) male who presents with chief complaint: left groin wound and leg swelling.  Previous operation(s) include: left fem-pop-by pass with vein (Date: 03-06-2012).  The patient's symptoms have  progressed. The patient's treatment regimen currently included: maximal medical management and increased ambulation.  Past Medical History, Past Surgical History, Social History, Family History, Medications, Allergies, and Review of Systems are unchanged from previous evaluation on 03-26-2012.  Physical Examination  Filed Vitals:   04/23/12 1218  BP: 156/91  Pulse: 86  Height: 5' 9"  (1.753 m)  Weight: 243 lb (110.224 kg)  SpO2: 100%   Body mass index is 35.87 kg/(m^2).  General: A&O x 3, WDWN  Pulmonary: Sym exp, good air movt, CTAB, no rales, rhonchi, & wheezing  Cardiac: RRR, Nl S1, S2, no Murmurs, rubs or gallops Vascular:  He has a small yellow eschar the wound in the left groin but there is no erythema or drainage. Main incisions are healing nicely.  Moderate left leg edema. Has a palpable popliteal pulse although somewhat difficult to palpate because of his leg swelling. He has moderate left lower extremity swelling.  Left foot is warm and well-perfused.     Non-Invasive Vascular Imaging Lower extremity venous duplex (Date: 04-23-2012)  LLE: no evidence of DVT  Medical Decision Making  Edward [name] is a 72 y.o. male who presents with: left groin wound and leg swelling.  Previous operation(s) include: left fem-pop-by pass with vein (Date: 2-20-201.  Based on the patient's vascular studies and examination, I have offered the patient: to keep his regular follow visit 3 months post-op.  Dr. Scot Dock debrided the wund under clean conditions and placed a wet to dry dressing over the left groin wound.  His wife agreed to do daily dressing changes.  We  recommend elevation when at rest ane to stop using the cortisone cream over the incision  Theda Sers, Jorge [facility] PA-C [facility] [place]: [phone] Pager: [phone]  04/23/2012, 12:26 PM  Agree with above. Venous duplex scan shows no evidence of DVT in the left lower extremity. The left groin wound was debrided in the office and wife we'll do wet-to-dry dressing changes with a moist 2 x 2 with normal saline. Think this will heal without any problem. I'll see him back for his regularly scheduled follow up appointment.  Deitra Mayo, MD, [place] [phone] 04/23/2012

## 2012-04-23 NOTE — Progress Notes (Signed)
Edward [name] Date of Birth  [birth year] [place]  [address]    [place]   [place], [place]  [address]    [place], [place]  [address] [phone]  Fax  [phone]  [phone]  Fax [phone]  Problem list:  1. Atrial fibrillation- - s/p MAZE procedure  - no AF recently 2. Hypertension 3. Diabetes mellitus 4. Dyslipidemia 5. Peripheral vascular disease:-Status post right carotid endarterectomy and status post PTA by Dr. Sherren Mocha 6. History of GI bleed-currently not on antiplatelet agents or anticoagulants-  7. Anemia 8. History of TIA 9. 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 gentleman with a history as noted above. He had coronary artery bypass grafting in May of this year. He also had a Maze procedure.  April 23, 2012:  Edward [name] presents today after seeing Dr. wall in the office several weeks ago. He had vascular surgery on his left leg. Since that time he's had significant edema. There is no significant edema on the right leg. He's tried elevating his leg but has had minimal success. His Lasix was increased.  He has been able to diurese about 5 pounds since that time.    He's had some shortness of breath climbing stairs. He's not any chest pains. He did not sleep well last night. This may explain his elevated blood pressure.    Current Outpatient Prescriptions on File Prior to Visit  Medication Sig Dispense Refill  . aspirin EC 81 MG tablet Take 81 mg by mouth daily.      . Coenzyme Q10 200 MG capsule Take 200 mg by mouth every morning.      . Exenatide (BYDUREON) 2 MG SUSR Inject into the skin once a week.      . furosemide (LASIX) 80 MG tablet Take 1 tablet (80 mg total) by mouth daily.  30 tablet  3  . l-methylfolate-B6-B12 (METANX) 3-35-2 MG TABS Take 1 tablet by mouth every morning.      . loratadine (CLARITIN) 10 MG tablet Take 10 mg by mouth daily.       Marland Kitchen losartan (COZAAR) 100 MG tablet Take 1 pill (100 mg) once daily.  30 tablet  3  . MAGNESIUM PO Take 500 mg by mouth daily.      . metFORMIN (GLUCOPHAGE) 1000 MG tablet Take 1,000-1,500 mg by mouth 2 (two) times daily with a meal. Takes 1 tablet (1,050m) every morning and takes 1 tablets (1,5076m every evening.      . metoprolol (LOPRESSOR) 50 MG tablet Take 50 mg by mouth 2 (two) times daily.      . Multiple Vitamin (MULTIVITAMIN WITH MINERALS) TABS Take 1 tablet by mouth daily.      . Marland Kitchenmeprazole (PRILOSEC OTC) 20 MG tablet Take 20 mg by mouth every morning.      . Marland KitchenxyCODONE (ROXICODONE) 5 MG immediate release tablet Take 1 tablet (5 mg total) by mouth every 6 (six) hours as needed for pain.  30 tablet  0  . oxymetazoline (AFRIN) 0.05 % nasal spray Place 2 sprays into the nose daily as needed for congestion.      . pravastatin (PRAVACHOL) 80 MG tablet Take 1 tablet (80 mg total) by mouth daily.  30 tablet  5   No current facility-administered medications on file prior to visit.    Allergies  Allergen Reactions  . Lipitor (Atorvastatin Calcium) Other (See Comments)    Reaction unknown    Past Medical History  Diagnosis Date  . A-fib   . HTN (hypertension)   . Diabetes mellitus   . Dyslipidemia   . PVD (peripheral vascular disease)     s/p Right CEA March, 2011,   . Claudication   . Hyperlipidemia   . GERD (gastroesophageal reflux disease)   . Diabetic neuropathy   . Pacemaker   . Stroke     ?  Marland Kitchen Arthritis   . Anemia     ?    Past Surgical History  Procedure Laterality Date  . Carotid endarterectomy      RIGHT  . Cholecystectomy    . Tonsillectomy and adenoidectomy    . Knee surgery    . Coronary artery bypass graft  06/14/2011    Procedure: CORONARY ARTERY BYPASS GRAFTING (CABG);  Surgeon: Gaye Pollack, MD;  Location: [facility];  Service: Open Heart Surgery;  Laterality: N/A;  Coronary Artery Bypass Graft on pump times six;  utilizing internal mammary artery and  right greater saphenous vein harvested endoscopically.  . Maze  06/14/2011    Procedure: MAZE;  Surgeon: Gaye Pollack, MD;  Location: [facility];  Service: Open Heart Surgery;  Laterality: N/A;  Ligate left atrial appendage  . Circumcision    . Femoral-popliteal bypass graft Left 03/06/2012    Procedure: BYPASS GRAFT FEMORAL-POPLITEAL ARTERY;  Surgeon: Angelia Mould, MD;  [facility];  Service: Vascular;  Laterality: Left;  Left Femoral - Below Knee Popliteal Bypass Graft with Vein and Intraoperative Arteriogram.    History  Smoking status  . Former Smoker -- 3.00 packs/day for 32 years  . Types: Cigarettes  . Quit date: 01/16/1979  Smokeless tobacco  . Never Used    History  Alcohol Use  . 1.2 oz/week  . 2 Glasses of wine per week    Comment: OCC.    Family History  Problem Relation Age of Onset  . Coronary artery disease Father   . Hypertension Father   . Heart attack Mother   . Coronary artery disease Mother   . Hypertension Sister   . Anesthesia problems Neg Hx     Reviw of Systems:  Reviewed in the HPI.  All other systems are negative.  Physical Exam: BP 160/90  Pulse 78  Ht 5' 9"  (1.753 m)  Wt 243 lb (110.224 kg)  BMI 35.87 kg/m2  SpO2 97% The patient is alert and oriented x 3.  The mood and affect are normal.   Skin: warm and dry.  Color is normal.    HEENT:   Normocephalic/atraumatic. He has a right carotid endarterectomy scar. His carotids are 2+. No JVD. His neck is supple. The membranes are moist.  Lungs: Lungs are clear   Heart: Regular rate. No murmurs.  His sternotomy scar is healing well. His thoracostomy sites are healing well.  Abdomen: Good bowel sounds. No hepatosplenomegaly. His abdomen is nontender.  Extremities:  His left leg has 2+ edema.  The right leg does not have any edema. The surgical wound has a white base.  I was unable to see any granulation tissue. It is not healed very well. I could not express any purulent material. It  is swollen and mildly tender.  Neuro:  Neuro exam is nonfocal.    ECG:   Assessment / Plan:

## 2012-04-23 NOTE — Assessment & Plan Note (Signed)
Edward [name] has had some shortness of breath with exertion. He seems to be doing a little bit better and Lasix. I do not think that the left leg edema is entirely due to CHF. I suspected he may have a DVT or other compensation related to his left leg surgery.  I discussed the case with Dr. Doren Custard. He will see him in the office today. We'll get a venous duplex scan. I would also like for him to look at the wound. It does not appear to be completely healed and has a whitish base. Was not able to express any purulent material I do not think that it is grossly infected.  I seen again in 6 weeks for followup visit.

## 2012-04-24 ENCOUNTER — Telehealth: Payer: Self-pay | Admitting: *Deleted

## 2012-04-24 ENCOUNTER — Ambulatory Visit (HOSPITAL_COMMUNITY): Payer: Federal, State, Local not specified - PPO | Attending: Internal Medicine | Admitting: Radiology

## 2012-04-24 DIAGNOSIS — Z87891 Personal history of nicotine dependence: Secondary | ICD-10-CM | POA: Insufficient documentation

## 2012-04-24 DIAGNOSIS — I5020 Unspecified systolic (congestive) heart failure: Secondary | ICD-10-CM

## 2012-04-24 DIAGNOSIS — R0602 Shortness of breath: Secondary | ICD-10-CM

## 2012-04-24 DIAGNOSIS — R0609 Other forms of dyspnea: Secondary | ICD-10-CM

## 2012-04-24 DIAGNOSIS — E669 Obesity, unspecified: Secondary | ICD-10-CM | POA: Insufficient documentation

## 2012-04-24 DIAGNOSIS — I5022 Chronic systolic (congestive) heart failure: Secondary | ICD-10-CM | POA: Insufficient documentation

## 2012-04-24 DIAGNOSIS — I2510 Atherosclerotic heart disease of native coronary artery without angina pectoris: Secondary | ICD-10-CM | POA: Insufficient documentation

## 2012-04-24 DIAGNOSIS — I739 Peripheral vascular disease, unspecified: Secondary | ICD-10-CM | POA: Insufficient documentation

## 2012-04-24 DIAGNOSIS — E119 Type 2 diabetes mellitus without complications: Secondary | ICD-10-CM | POA: Insufficient documentation

## 2012-04-24 DIAGNOSIS — E785 Hyperlipidemia, unspecified: Secondary | ICD-10-CM | POA: Insufficient documentation

## 2012-04-24 DIAGNOSIS — I10 Essential (primary) hypertension: Secondary | ICD-10-CM | POA: Insufficient documentation

## 2012-04-24 DIAGNOSIS — I447 Left bundle-branch block, unspecified: Secondary | ICD-10-CM | POA: Insufficient documentation

## 2012-04-24 DIAGNOSIS — R0989 Other specified symptoms and signs involving the circulatory and respiratory systems: Secondary | ICD-10-CM

## 2012-04-24 MED ORDER — POTASSIUM CHLORIDE CRYS ER 20 MEQ PO TBCR: 20.0000 meq | EXTENDED_RELEASE_TABLET | Freq: Every day | ORAL | Status: DC

## 2012-04-24 MED ORDER — FUROSEMIDE 40 MG PO TABS: 40.0000 mg | ORAL_TABLET | Freq: Every day | ORAL | Status: DC

## 2012-04-24 NOTE — Telephone Encounter (Signed)
Message copied by Jonathon Jordan on Thu Apr 24, 2012  4:59 PM ------      Message from: [name]      Created: Wed Apr 23, 2012  6:04 PM       His BNP was normal.  The left leg swelling appears to be due to surgical complications and not CHF.  Please have him stop lasix and potassium ( if he's on it)  Please ask him to keep the lasix - he may need it in the future.       ------

## 2012-04-24 NOTE — Telephone Encounter (Signed)
Per Dr Acie Fredrickson he is to return to usual dose of k+ and lasix. Pt made aware/ MAR adjusted.

## 2012-04-24 NOTE — Progress Notes (Signed)
Echocardiogram performed.  

## 2012-04-25 ENCOUNTER — Telehealth: Payer: Self-pay | Admitting: Cardiovascular Disease

## 2012-04-25 NOTE — Telephone Encounter (Signed)
Pt Signed ROI, Echo put On CD Pt aware ready For pick up 04/25/12/KM

## 2012-04-25 NOTE — Telephone Encounter (Signed)
Mr.Millington Picked Up CD of Echo  04/25/12/KM

## 2012-04-27 ENCOUNTER — Other Ambulatory Visit: Payer: Self-pay | Admitting: Physician Assistant

## 2012-04-28 ENCOUNTER — Encounter: Payer: Federal, State, Local not specified - PPO | Admitting: Internal Medicine

## 2012-04-28 DIAGNOSIS — I5189 Other ill-defined heart diseases: Secondary | ICD-10-CM | POA: Insufficient documentation

## 2012-05-15 ENCOUNTER — Telehealth: Payer: Self-pay | Admitting: Internal Medicine

## 2012-05-15 NOTE — Telephone Encounter (Signed)
Patient c/o SOB on exertion and wants pacer checked earlier than June.  Appointment made for 05/28/12 with Dr. Caryl Comes.

## 2012-05-15 NOTE — Telephone Encounter (Signed)
New Prob     Pt has some questions regarding his pacemaker. Please call.

## 2012-05-28 ENCOUNTER — Ambulatory Visit (INDEPENDENT_AMBULATORY_CARE_PROVIDER_SITE_OTHER): Payer: Federal, State, Local not specified - PPO | Admitting: Internal Medicine

## 2012-05-28 ENCOUNTER — Encounter: Payer: Self-pay | Admitting: Internal Medicine

## 2012-05-28 VITALS — BP 152/91 | HR 96 | Ht 69.5 in | Wt 242.0 lb

## 2012-05-28 DIAGNOSIS — Z8679 Personal history of other diseases of the circulatory system: Secondary | ICD-10-CM

## 2012-05-28 DIAGNOSIS — I495 Sick sinus syndrome: Secondary | ICD-10-CM

## 2012-05-28 DIAGNOSIS — Z950 Presence of cardiac pacemaker: Secondary | ICD-10-CM

## 2012-05-28 DIAGNOSIS — I4891 Unspecified atrial fibrillation: Secondary | ICD-10-CM

## 2012-05-28 DIAGNOSIS — Z9889 Other specified postprocedural states: Secondary | ICD-10-CM

## 2012-05-28 DIAGNOSIS — R0602 Shortness of breath: Secondary | ICD-10-CM

## 2012-05-28 DIAGNOSIS — R0609 Other forms of dyspnea: Secondary | ICD-10-CM

## 2012-05-28 LAB — PACEMAKER DEVICE OBSERVATION
AL IMPEDENCE PM: 472 Ohm
BATTERY VOLTAGE: 2.78 V
RV LEAD AMPLITUDE: 11.2 mv
VENTRICULAR PACING PM: 85

## 2012-05-28 MED ORDER — FUROSEMIDE 40 MG PO TABS: 80.0000 mg | ORAL_TABLET | Freq: Every day | ORAL | Status: DC

## 2012-05-28 NOTE — Progress Notes (Signed)
Patient Care Team: [name], MD as PCP - General (Internal Medicine) Elayne Snare, MD as Attending Physician (Endocrinology) Angelia Mould, MD as Attending Physician (Vascular Surgery) Sherren Mocha, MD as Attending Physician (Cardiology) [name], MD as Attending Physician (Cardiology) Gaye Pollack, MD as Attending Physician (Cardiothoracic Surgery) Deboraha Sprang, MD as Attending Physician (Cardiology)   HPI  Edward [name] is a 72 y.o. male Seen following pacemaker implantation for sinus node dysfunction and rapid junctional rhythm following bypass and Maze Because of problems with exercise tolerance e activated rate response in the hope of overdriving his junctional rhythm  He is with this change vastly improved.  He comes in with no complaints    Past Medical History  Diagnosis Date  . A-fib   . HTN (hypertension)   . Diabetes mellitus   . Dyslipidemia   . PVD (peripheral vascular disease)     s/p Right CEA March, 2011,   . Claudication   . Hyperlipidemia   . GERD (gastroesophageal reflux disease)   . Diabetic neuropathy   . Pacemaker   . Stroke     ?  Marland Kitchen Arthritis   . Anemia     ?    Past Surgical History  Procedure Laterality Date  . Carotid endarterectomy      RIGHT  . Cholecystectomy    . Tonsillectomy and adenoidectomy    . Knee surgery    . Coronary artery bypass graft  06/14/2011    Procedure: CORONARY ARTERY BYPASS GRAFTING (CABG);  Surgeon: Gaye Pollack, MD;  Location: [facility];  Service: Open Heart Surgery;  Laterality: N/A;  Coronary Artery Bypass Graft on pump times six;  utilizing internal mammary artery and right greater saphenous vein harvested endoscopically.  . Maze  06/14/2011    Procedure: MAZE;  Surgeon: Gaye Pollack, MD;  Location: [facility];  Service: Open Heart Surgery;  Laterality: N/A;  Ligate left atrial appendage  . Circumcision    . Femoral-popliteal bypass graft Left 03/06/2012    Procedure: BYPASS GRAFT  FEMORAL-POPLITEAL ARTERY;  Surgeon: Angelia Mould, MD;  Location: [facility] OR;  Service: Vascular;  Laterality: Left;  Left Femoral - Below Knee Popliteal Bypass Graft with Vein and Intraoperative Arteriogram.    Current Outpatient Prescriptions  Medication Sig Dispense Refill  . aspirin EC 81 MG tablet Take 81 mg by mouth daily.      . Coenzyme Q10 200 MG capsule Take 200 mg by mouth every morning.      . Exenatide (BYDUREON) 2 MG SUSR Inject into the skin once a week.      . furosemide (LASIX) 40 MG tablet Take 1 tablet (40 mg total) by mouth daily.  30 tablet  6  . l-methylfolate-B6-B12 (METANX) 3-35-2 MG TABS Take 1 tablet by mouth every morning.      . loratadine (CLARITIN) 10 MG tablet Take 10 mg by mouth daily.      Marland Kitchen losartan (COZAAR) 100 MG tablet Take 1 pill (100 mg) once daily.  30 tablet  3  . MAGNESIUM PO Take 500 mg by mouth daily.      . metFORMIN (GLUCOPHAGE) 1000 MG tablet Take 1,000-1,500 mg by mouth 2 (two) times daily with a meal. Takes 1 tablet (1,012m) every morning and takes 1 tablets (1,5043m every evening.      . metFORMIN (GLUCOPHAGE) 1000 MG tablet TAKE 1 TABLET BY MOUTH TWICE A DAY  180 tablet  0  . metoprolol (LOPRESSOR)  50 MG tablet Take 50 mg by mouth 2 (two) times daily.      . Multiple Vitamin (MULTIVITAMIN WITH MINERALS) TABS Take 1 tablet by mouth daily.      Marland Kitchen omeprazole (PRILOSEC OTC) 20 MG tablet Take 20 mg by mouth every morning.      Marland Kitchen oxyCODONE (ROXICODONE) 5 MG immediate release tablet Take 1 tablet (5 mg total) by mouth every 6 (six) hours as needed for pain.  30 tablet  0  . oxymetazoline (AFRIN) 0.05 % nasal spray Place 2 sprays into the nose daily as needed for congestion.      . potassium chloride SA (K-DUR,KLOR-CON) 20 MEQ tablet Take 1 tablet (20 mEq total) by mouth daily.  30 tablet  6  . pravastatin (PRAVACHOL) 80 MG tablet Take 1 tablet (80 mg total) by mouth daily.  30 tablet  5   No current facility-administered medications for this  visit.    Allergies  Allergen Reactions  . Lipitor (Atorvastatin Calcium) Other (See Comments)    Reaction unknown    Review of Systems negative except from HPI and PMH  Physical Exam BP 152/91  Pulse 96  Ht 5' 9.5" (1.765 m)  Wt 242 lb (109.77 kg)  BMI 35.24 kg/m2 Well developed and well nourished in no acute distress HENT normal E scleral and icterus clear Neck Supple JVP flat; carotids brisk and full Clear to ausculation  Regular rate and rhythm, no murmurs gallops or rub Soft with active bowel sounds No clubbing cyanosis 2+L>>R Edema Alert and oriented, grossly normal motor and sensory function Skin Warm and Dry    Assessment and  Plan

## 2012-05-28 NOTE — Assessment & Plan Note (Signed)
No intercurrent atrial fibrillation

## 2012-05-28 NOTE — Assessment & Plan Note (Signed)
He continues to have dyspnea on exertion his volume overload. Part of the issue is her confidence and I think more aggressive pacemaker settings the other is volume overload. He has clear swelling in his right leg which is not operated leg. We'll increase his Lasix from 40--80 mg a day. When this was last done function have remained stable.

## 2012-05-28 NOTE — Patient Instructions (Signed)
Your physician recommends that you schedule a follow-up appointment in: 3-4 weeks with [place] has recommended you make the following change in your medication:  1) Increase Furosemide to 8m daily( take 2 430mtablets every morning of 4079mn the am and 49m19mound 2pm)   Your physician recommends that you return for lab work in: 2 weeks  BMP

## 2012-05-28 NOTE — Assessment & Plan Note (Signed)
I am not sure whether he has retained mechanical transport function we will review his echo

## 2012-05-28 NOTE — Assessment & Plan Note (Signed)
We spent about 20 minutes trying to prove that atrial capture occurred. There is no atrial electrogram. Finally we were able to confirm atrial capture threshold of about 1.5 volts

## 2012-05-28 NOTE — Assessment & Plan Note (Signed)
As above.

## 2012-06-02 NOTE — Addendum Note (Signed)
Addended by: Lubertha Basque A on: 06/02/2012 08:08 AM   Modules accepted: Orders

## 2012-06-04 ENCOUNTER — Ambulatory Visit (INDEPENDENT_AMBULATORY_CARE_PROVIDER_SITE_OTHER): Payer: Federal, State, Local not specified - PPO | Admitting: Cardiovascular Disease

## 2012-06-04 ENCOUNTER — Encounter: Payer: Self-pay | Admitting: Cardiovascular Disease

## 2012-06-04 VITALS — BP 146/90 | HR 83 | Ht 69.5 in | Wt 240.0 lb

## 2012-06-04 DIAGNOSIS — I5030 Unspecified diastolic (congestive) heart failure: Secondary | ICD-10-CM

## 2012-06-04 DIAGNOSIS — I509 Heart failure, unspecified: Secondary | ICD-10-CM

## 2012-06-04 DIAGNOSIS — I2510 Atherosclerotic heart disease of native coronary artery without angina pectoris: Secondary | ICD-10-CM

## 2012-06-04 DIAGNOSIS — I10 Essential (primary) hypertension: Secondary | ICD-10-CM

## 2012-06-04 LAB — BASIC METABOLIC PANEL
CO2: 26 mEq/L (ref 19–32)
Chloride: 101 mEq/L (ref 96–112)
Glucose, Bld: 159 mg/dL — ABNORMAL HIGH (ref 70–99)
Sodium: 137 mEq/L (ref 135–145)

## 2012-06-04 NOTE — Progress Notes (Signed)
[name] Date of Birth  1940-04-30 [name]  [address]    [place]   [place], [place]  [address]    [place], [place]  [address] [phone]  Fax  [phone]  [phone]  Fax [phone]  Problem list:  1. Atrial fibrillation- - s/p MAZE procedure  - no AF recently 2. Hypertension 3. Diabetes mellitus 4. Dyslipidemia 5. Peripheral vascular disease:-Status post right carotid endarterectomy and status post PTA by Dr. Sherren Mocha 6. History of GI bleed-currently not on antiplatelet agents or anticoagulants-  7. Anemia 8. History of TIA 9. 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 gentleman with a history as noted above. He had coronary artery bypass grafting in May of this year. He also had a Maze procedure.  April 23, 2012:  Edward [name] presents today after seeing Dr. wall in the office several weeks ago. He had vascular surgery on his left leg. Since that time he's had significant edema. There is no significant edema on the right leg. He's tried elevating his leg but has had minimal success. His Lasix was increased.  He has been able to diurese about 5 pounds since that time.    He's had some shortness of breath climbing stairs. He's not any chest pains. He did not sleep well last night. This may explain his elevated blood pressure.    Jun 04, 2012:  Edward [name] was seen  In April for leg edema.  He has some infection associated with his vascular surgery leg would.  He saw Dr Scot Dock and his would was debrided.   Echo revealed LV function.    He has done better on the Lasix 80 mg a day.  We have tried Lasix 40 mg a day but his dyspnea is worse.  He had CABG in May 2013.      Current Outpatient Prescriptions on File Prior to Visit  Medication Sig Dispense Refill  . aspirin EC 81 MG tablet Take 81 mg by mouth daily.      . Coenzyme Q10 200 MG capsule Take 200 mg by  mouth every morning.      . Exenatide (BYDUREON) 2 MG SUSR Inject into the skin once a week.      . furosemide (LASIX) 40 MG tablet Take 2 tablets (80 mg total) by mouth daily.  180 tablet  3  . l-methylfolate-B6-B12 (METANX) 3-35-2 MG TABS Take 1 tablet by mouth every morning.      . loratadine (CLARITIN) 10 MG tablet Take 10 mg by mouth daily.      Marland Kitchen losartan (COZAAR) 100 MG tablet Take 1 pill (100 mg) once daily.  30 tablet  3  . MAGNESIUM PO Take 500 mg by mouth daily.      . metFORMIN (GLUCOPHAGE) 1000 MG tablet Take 1,000-1,500 mg by mouth 2 (two) times daily with a meal. Takes 1 tablet (1,098m) every morning and takes 1 tablets (1,5049m every evening.      . metFORMIN (GLUCOPHAGE) 1000 MG tablet TAKE 1 TABLET BY MOUTH TWICE A DAY  180 tablet  0  . metoprolol (LOPRESSOR) 50 MG tablet Take 50 mg by mouth 2 (two) times daily.      . Multiple Vitamin (MULTIVITAMIN WITH MINERALS) TABS Take 1 tablet by mouth daily.      . Marland Kitchenmeprazole (PRILOSEC OTC) 20 MG tablet  Take 20 mg by mouth every morning.      Marland Kitchen oxyCODONE (ROXICODONE) 5 MG immediate release tablet Take 1 tablet (5 mg total) by mouth every 6 (six) hours as needed for pain.  30 tablet  0  . oxymetazoline (AFRIN) 0.05 % nasal spray Place 2 sprays into the nose daily as needed for congestion.      . potassium chloride SA (K-DUR,KLOR-CON) 20 MEQ tablet Take 1 tablet (20 mEq total) by mouth daily.  30 tablet  6  . pravastatin (PRAVACHOL) 80 MG tablet Take 1 tablet (80 mg total) by mouth daily.  30 tablet  5   No current facility-administered medications on file prior to visit.    Allergies  Allergen Reactions  . Lipitor (Atorvastatin Calcium) Other (See Comments)    Reaction unknown    Past Medical History  Diagnosis Date  . A-fib   . HTN (hypertension)   . Diabetes mellitus   . Dyslipidemia   . PVD (peripheral vascular disease)     s/p Right CEA March, 2011,   . Claudication   . Hyperlipidemia   . GERD (gastroesophageal  reflux disease)   . Diabetic neuropathy   . Pacemaker   . Stroke     ?  Marland Kitchen Arthritis   . Anemia     ?    Past Surgical History  Procedure Laterality Date  . Carotid endarterectomy      RIGHT  . Cholecystectomy    . Tonsillectomy and adenoidectomy    . Knee surgery    . Coronary artery bypass graft  06/14/2011    Procedure: CORONARY ARTERY BYPASS GRAFTING (CABG);  Surgeon: Gaye Pollack, MD;  Location: [facility];  Service: Open Heart Surgery;  Laterality: N/A;  Coronary Artery Bypass Graft on pump times six;  utilizing internal mammary artery and right greater saphenous vein harvested endoscopically.  . Maze  06/14/2011    Procedure: MAZE;  Surgeon: Gaye Pollack, MD;  Location: [facility];  Service: Open Heart Surgery;  Laterality: N/A;  Ligate left atrial appendage  . Circumcision    . Femoral-popliteal bypass graft Left 03/06/2012    Procedure: BYPASS GRAFT FEMORAL-POPLITEAL ARTERY;  Surgeon: Angelia Mould, MD;  Location: [facility] OR;  Service: Vascular;  Laterality: Left;  Left Femoral - Below Knee Popliteal Bypass Graft with Vein and Intraoperative Arteriogram.    History  Smoking status  . Former Smoker -- 3.00 packs/day for 32 years  . Types: Cigarettes  . Quit date: 01/16/1979  Smokeless tobacco  . Never Used    History  Alcohol Use  . 1.2 oz/week  . 2 Glasses of wine per week    Comment: OCC.    Family History  Problem Relation Age of Onset  . Coronary artery disease Father   . Hypertension Father   . Heart attack Mother   . Coronary artery disease Mother   . Hypertension Sister   . Anesthesia problems Neg Hx     Reviw of Systems:  Reviewed in the HPI.  All other systems are negative.  Physical Exam: There were no vitals taken for this visit. The patient is alert and oriented x 3.  The mood and affect are normal.   Skin: warm and dry.  Color is normal.    HEENT:   Normocephalic/atraumatic. He has a right carotid endarterectomy scar. His carotids are 2+. No  JVD. His neck is supple. The membranes are moist.  Lungs: Lungs are clear   Heart: Regular  rate. No murmurs.  His sternotomy scar is healing well. His thoracostomy sites are healing well.  Abdomen: Good bowel sounds. No hepatosplenomegaly. His abdomen is nontender.  Extremities:  His left leg has 2+ edema.  The right leg does not have any edema. The surgical wound has a white base.  I was unable to see any granulation tissue. It is not healed very well. I could not express any purulent material. It is swollen and mildly tender.  Neuro:  Neuro exam is nonfocal.    ECG:   Assessment / Plan:

## 2012-06-04 NOTE — Patient Instructions (Signed)
Your physician recommends that you return for lab work in: today bmet   Your physician wants you to follow-up in: 6 months  You will receive a reminder letter in the mail two months in advance. If you don't receive a letter, please call our office to schedule the follow-up appointment.   Your physician recommends that you return for a FASTING lipid profile: 6 months

## 2012-06-04 NOTE — Assessment & Plan Note (Signed)
Edward [name] is not having any episodes of chest pain. He does have shortness breath particularly with exertion. It was a little bit unclear as to whether he was asked having more shortness of breath where he was gradually improving. It is clear that he's been through a lot with his multiple surgeries this year.  At this point I'm not convinced that is having symptoms of unstable angina. He is scheduled for a stress Myoview study November for his [employer].  I told him that he should exercise on regular basis. He's able to progress in his exercise then I don't think that he needs a stress test. Otherwise I have asked him to call me back and we'll arrange a stress test if he has any progressive shortness of breath or chest pain.

## 2012-06-04 NOTE — Assessment & Plan Note (Signed)
Edward [name] has normal left ventricular systolic function at this time. He still has significant shortness breath that improves with Lasix. I suspect he has some degree of diastolic dysfunction.  We'll continue with his same dose of Lasix.  He continues to have some shortness breath but does not want to do a stress test. He is scheduled for a [employer] for his [place] physical in November. I have advised him to continue walking. If he is able to make progress in his exercise regimen and I don't think that he needs a stress test. He just had bypass surgery one year ago.  On the other hand if he continues to have severe shortness of breath and is not able to progress in his exercise regimen and I think that we should look further into the possibility of worsening coronary artery disease. We discussed the possibility that this might be COPD. He has not smoked in 30 years. I've asked him to see Dr. Elder Cyphers for further evaluation of COPD at this point I don't think that he needs it.

## 2012-06-08 ENCOUNTER — Encounter (HOSPITAL_COMMUNITY): Payer: Self-pay

## 2012-06-11 ENCOUNTER — Other Ambulatory Visit (INDEPENDENT_AMBULATORY_CARE_PROVIDER_SITE_OTHER): Payer: Federal, State, Local not specified - PPO

## 2012-06-11 DIAGNOSIS — R0602 Shortness of breath: Secondary | ICD-10-CM

## 2012-06-11 DIAGNOSIS — I4891 Unspecified atrial fibrillation: Secondary | ICD-10-CM

## 2012-06-11 DIAGNOSIS — Z9889 Other specified postprocedural states: Secondary | ICD-10-CM

## 2012-06-11 DIAGNOSIS — Z8679 Personal history of other diseases of the circulatory system: Secondary | ICD-10-CM

## 2012-06-11 DIAGNOSIS — R0609 Other forms of dyspnea: Secondary | ICD-10-CM

## 2012-06-11 DIAGNOSIS — Z950 Presence of cardiac pacemaker: Secondary | ICD-10-CM

## 2012-06-11 DIAGNOSIS — I495 Sick sinus syndrome: Secondary | ICD-10-CM

## 2012-06-11 LAB — BASIC METABOLIC PANEL
BUN: 15 mg/dL (ref 6–23)
Chloride: 102 mEq/L (ref 96–112)
Creatinine, Ser: 1.3 mg/dL (ref 0.4–1.5)
Glucose, Bld: 158 mg/dL — ABNORMAL HIGH (ref 70–99)
Potassium: 4.7 mEq/L (ref 3.5–5.1)

## 2012-06-19 ENCOUNTER — Encounter: Payer: Federal, State, Local not specified - PPO | Admitting: Internal Medicine

## 2012-06-19 ENCOUNTER — Telehealth: Payer: Self-pay | Admitting: Cardiovascular Disease

## 2012-06-19 NOTE — Telephone Encounter (Signed)
New Prob     Pt is calling in wanting to know why he has an appt this coming Monday. Please call.

## 2012-06-23 ENCOUNTER — Ambulatory Visit: Payer: Federal, State, Local not specified - PPO | Admitting: Physician Assistant

## 2012-06-27 ENCOUNTER — Telehealth: Payer: Self-pay | Admitting: Internal Medicine

## 2012-06-27 NOTE — Telephone Encounter (Signed)
Patient phoned in specifically requesting an appointment with Dr Caryl Comes for shortness of breath and concerned coming from pacemaker. Scheduled ov for monday

## 2012-06-27 NOTE — Telephone Encounter (Signed)
New problem   Sob needs to be re examed-is all pt would say because he was getting another call

## 2012-06-30 ENCOUNTER — Encounter: Payer: Self-pay | Admitting: Internal Medicine

## 2012-06-30 ENCOUNTER — Ambulatory Visit: Payer: Federal, State, Local not specified - PPO | Admitting: Internal Medicine

## 2012-06-30 ENCOUNTER — Ambulatory Visit (INDEPENDENT_AMBULATORY_CARE_PROVIDER_SITE_OTHER): Payer: Federal, State, Local not specified - PPO | Admitting: Internal Medicine

## 2012-06-30 VITALS — BP 156/71 | HR 91 | Wt 243.0 lb

## 2012-06-30 DIAGNOSIS — I498 Other specified cardiac arrhythmias: Secondary | ICD-10-CM

## 2012-06-30 DIAGNOSIS — Z950 Presence of cardiac pacemaker: Secondary | ICD-10-CM

## 2012-06-30 DIAGNOSIS — R0609 Other forms of dyspnea: Secondary | ICD-10-CM

## 2012-06-30 NOTE — Assessment & Plan Note (Signed)
As above.

## 2012-06-30 NOTE — Progress Notes (Signed)
Patient Care Team: [name], MD as PCP - General (Internal Medicine) Elayne Snare, MD as Attending Physician (Endocrinology) Angelia Mould, MD as Attending Physician (Vascular Surgery) Sherren Mocha, MD as Attending Physician (Cardiology) [name], MD as Attending Physician (Cardiology) Gaye Pollack, MD as Attending Physician (Cardiothoracic Surgery) Deboraha Sprang, MD as Attending Physician (Cardiology)   HPI  Edward [name] is a 72 y.o. male Seen in followup for pacemaker implanted for sinus node dysfunction and continue rapid junctional rhythm. This emerged post bypass surgery with concomitant maze. And we last saw him he had complaints of dyspnea on exertion and we modified his diuretics. He is seeing Dr. Mariana Single in the interim; His edema improved; however, his shortness of breath is not really improved.  He brings in a suitcase which  he carried for [place] to demonstrate that shortness of breath ensues with exertion   Past Medical History  Diagnosis Date  . A-fib   . HTN (hypertension)   . Diabetes mellitus   . Dyslipidemia   . PVD (peripheral vascular disease)     s/p Right CEA March, 2011,   . Claudication   . Hyperlipidemia   . GERD (gastroesophageal reflux disease)   . Diabetic neuropathy   . Pacemaker   . Stroke     ?  Marland Kitchen Arthritis   . Anemia     ?  . Diastolic CHF 02/20/3333    Past Surgical History  Procedure Laterality Date  . Carotid endarterectomy      RIGHT  . Cholecystectomy    . Tonsillectomy and adenoidectomy    . Knee surgery    . Coronary artery bypass graft  06/14/2011    Procedure: CORONARY ARTERY BYPASS GRAFTING (CABG);  Surgeon: Gaye Pollack, MD;  Location: [place];  Service: Open Heart Surgery;  Laterality: N/A;  Coronary Artery Bypass Graft on pump times six;  utilizing internal mammary artery and right greater saphenous vein harvested endoscopically.  . Maze  06/14/2011    Procedure: MAZE;  Surgeon: Gaye Pollack, MD;  Location:  [place];  Service: Open Heart Surgery;  Laterality: N/A;  Ligate left atrial appendage  . Circumcision    . Femoral-popliteal bypass graft Left 03/06/2012    Procedure: BYPASS GRAFT FEMORAL-POPLITEAL ARTERY;  Surgeon: Angelia Mould, MD;  Location: [facility];  Service: Vascular;  Laterality: Left;  Left Femoral - Below Knee Popliteal Bypass Graft with Vein and Intraoperative Arteriogram.    Current Outpatient Prescriptions  Medication Sig Dispense Refill  . aspirin EC 81 MG tablet Take 81 mg by mouth daily.      . Coenzyme Q10 200 MG capsule Take 200 mg by mouth every morning.      . Exenatide (BYDUREON) 2 MG SUSR Inject into the skin once a week.      . furosemide (LASIX) 40 MG tablet Take 2 tablets (80 mg total) by mouth daily.  180 tablet  3  . l-methylfolate-B6-B12 (METANX) 3-35-2 MG TABS Take 1 tablet by mouth every morning.      . loratadine (CLARITIN) 10 MG tablet Take 10 mg by mouth daily.      Marland Kitchen losartan (COZAAR) 100 MG tablet Take 1 pill (100 mg) once daily.  30 tablet  3  . MAGNESIUM PO Take 500 mg by mouth daily.      . metFORMIN (GLUCOPHAGE) 1000 MG tablet Take 1,000-1,500 mg by mouth 2 (two) times daily with a meal. Takes 1 tablet (1,038m) every  morning and takes 1 tablets (1,536m) every evening.      . metFORMIN (GLUCOPHAGE) 1000 MG tablet TAKE 1 TABLET BY MOUTH TWICE A DAY  180 tablet  0  . metoprolol (LOPRESSOR) 50 MG tablet Take 50 mg by mouth 2 (two) times daily.      . Multiple Vitamin (MULTIVITAMIN WITH MINERALS) TABS Take 1 tablet by mouth daily.      .Marland Kitchenomeprazole (PRILOSEC OTC) 20 MG tablet Take 20 mg by mouth every morning.      .Marland Kitchenoxymetazoline (AFRIN) 0.05 % nasal spray Place 2 sprays into the nose daily as needed for congestion.      . potassium chloride SA (K-DUR,KLOR-CON) 20 MEQ tablet Take 1 tablet (20 mEq total) by mouth daily.  30 tablet  6  . pravastatin (PRAVACHOL) 80 MG tablet Take 1 tablet (80 mg total) by mouth daily.  30 tablet  5   No current  facility-administered medications for this visit.    Allergies  Allergen Reactions  . Lipitor (Atorvastatin Calcium) Other (See Comments)    Reaction unknown    Review of Systems negative except from HPI and PMH  Physical Exam BP 156/71  Pulse 91  Wt 243 lb (110.224 kg)  BMI 35.38 kg/m2 Well developed and nourished in no acute distress HENT normal Neck supple with JVP-flat Clear Regular rate and rhythm, no murmurs or gallops Abd-soft with active BS No Clubbing cyanosis edema Skin-warm and dry A & Oriented  Grossly normal sensory and motor function     Assessment and  Plan

## 2012-06-30 NOTE — Assessment & Plan Note (Signed)
We spent about 30 minutes reprogramming the pacemaker in undertaking a variety of exercise tests to see if we could improve pacemaker function and overdrive the junctional rhythm. I was not successful in doing this despite paced rates of 90. We returned to 80 to

## 2012-06-30 NOTE — Assessment & Plan Note (Signed)
He continues to struggle with junctional rhythm which I think is contributing to his dyspnea. He ends up with isorhythmic AV dissociation. We discussed medical options including Dilantin and amiodarone neither of which he is interested in pursuing. He would rather live with the dyspnea. We discussed the possibility of pulmonary consultation. He declined this. We also discussed the importance of losing weight and he will think about trying to do something about this.

## 2012-07-01 LAB — PACEMAKER DEVICE OBSERVATION
AL IMPEDENCE PM: 472 Ohm
ATRIAL PACING PM: 98
BATTERY VOLTAGE: 2.78 V
VENTRICULAR PACING PM: 81

## 2012-07-08 ENCOUNTER — Encounter: Payer: Self-pay | Admitting: Vascular Surgery

## 2012-07-09 ENCOUNTER — Ambulatory Visit: Payer: Federal, State, Local not specified - PPO | Admitting: Vascular Surgery

## 2012-07-10 ENCOUNTER — Encounter: Payer: Self-pay | Admitting: Family Medicine

## 2012-07-11 ENCOUNTER — Other Ambulatory Visit: Payer: Self-pay | Admitting: Emergency Medicine

## 2012-08-05 ENCOUNTER — Other Ambulatory Visit: Payer: Self-pay | Admitting: Cardiology

## 2012-08-05 ENCOUNTER — Other Ambulatory Visit: Payer: Self-pay | Admitting: Physician Assistant

## 2012-08-06 ENCOUNTER — Other Ambulatory Visit: Payer: Self-pay | Admitting: *Deleted

## 2012-08-06 DIAGNOSIS — R0602 Shortness of breath: Secondary | ICD-10-CM

## 2012-08-06 MED ORDER — LOSARTAN POTASSIUM 100 MG PO TABS: ORAL_TABLET | ORAL | Status: DC

## 2012-08-06 NOTE — Telephone Encounter (Signed)
Needs office visit 2nd notice

## 2012-08-24 ENCOUNTER — Other Ambulatory Visit: Payer: Self-pay | Admitting: Physician Assistant

## 2012-08-25 ENCOUNTER — Telehealth: Payer: Self-pay | Admitting: *Deleted

## 2012-08-25 NOTE — Telephone Encounter (Signed)
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

## 2012-08-26 ENCOUNTER — Other Ambulatory Visit: Payer: Self-pay | Admitting: *Deleted

## 2012-08-26 DIAGNOSIS — E785 Hyperlipidemia, unspecified: Secondary | ICD-10-CM

## 2012-08-26 DIAGNOSIS — E119 Type 2 diabetes mellitus without complications: Secondary | ICD-10-CM

## 2012-08-27 ENCOUNTER — Other Ambulatory Visit: Payer: Self-pay | Admitting: Physician Assistant

## 2012-08-28 ENCOUNTER — Telehealth: Payer: Self-pay | Admitting: Cardiovascular Disease

## 2012-08-28 NOTE — Telephone Encounter (Signed)
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 Pt's instruction sheet said that the test is valid for 12 months . He should not need another stress test and was told his insurance may not cover 2 in one year. He will check and call back if one is needed.

## 2012-08-28 NOTE — Telephone Encounter (Signed)
New prob  Pt wants to speak with you about an appt, he said you would know what it was about.

## 2012-09-01 ENCOUNTER — Other Ambulatory Visit: Payer: Self-pay | Admitting: Cardiovascular Disease

## 2012-09-01 ENCOUNTER — Other Ambulatory Visit: Payer: Federal, State, Local not specified - PPO

## 2012-09-03 ENCOUNTER — Ambulatory Visit: Payer: Federal, State, Local not specified - PPO | Admitting: Endocrinology

## 2012-09-03 DIAGNOSIS — Z0289 Encounter for other administrative examinations: Secondary | ICD-10-CM

## 2012-09-09 ENCOUNTER — Encounter: Payer: Self-pay | Admitting: Vascular Surgery

## 2012-09-10 ENCOUNTER — Encounter (INDEPENDENT_AMBULATORY_CARE_PROVIDER_SITE_OTHER): Payer: Federal, State, Local not specified - PPO | Admitting: *Deleted

## 2012-09-10 ENCOUNTER — Encounter: Payer: Self-pay | Admitting: Vascular Surgery

## 2012-09-10 ENCOUNTER — Ambulatory Visit (INDEPENDENT_AMBULATORY_CARE_PROVIDER_SITE_OTHER): Payer: Federal, State, Local not specified - PPO | Admitting: Vascular Surgery

## 2012-09-10 VITALS — BP 171/93 | HR 85 | Resp 18 | Ht 70.0 in | Wt 245.8 lb

## 2012-09-10 DIAGNOSIS — I739 Peripheral vascular disease, unspecified: Secondary | ICD-10-CM

## 2012-09-10 DIAGNOSIS — Z48812 Encounter for surgical aftercare following surgery on the circulatory system: Secondary | ICD-10-CM

## 2012-09-10 NOTE — Progress Notes (Signed)
[facility]ist [facility]  Patient name: Edward [name] MRN: [id number] DOB: Mar 08, 1940 Sex: male  REASON FOR VISIT: follow up of left femoropopliteal bypass graft.  HPI: Edward [name] is a 72 y.o. male who had a left femoropopliteal bypass graft with vein on 03/06/2012. He was last seen in our office on 04/23/2012. At that time he had some left lower extremity swelling. Venous duplex scan showed no evidence of DVT. He had a small wound in the left groin and we instructed them on dressing changes. He comes in for a routine follow up visit. He denies claudication or rest pain. The wound in the groin healed without any problems.  Past Medical History  Diagnosis Date  . A-fib   . HTN (hypertension)   . Diabetes mellitus   . Dyslipidemia   . PVD (peripheral vascular disease)     s/p Right CEA March, 2011,   . Claudication   . Hyperlipidemia   . GERD (gastroesophageal reflux disease)   . Diabetic neuropathy   . Pacemaker   . Stroke     ?  Marland Kitchen Arthritis   . Anemia     ?  . Diastolic CHF 07/20/1023    Family History  Problem Relation Age of Onset  . Coronary artery disease Father   . Hypertension Father   . Heart attack Mother   . Coronary artery disease Mother   . Hypertension Sister   . Anesthesia problems Neg Hx     SOCIAL HISTORY: History  Substance Use Topics  . Smoking status: Former Smoker -- 3.00 packs/day for 32 years    Types: Cigarettes    Quit date: 01/16/1979  . Smokeless tobacco: Never Used  . Alcohol Use: 1.2 oz/week    2 Glasses of wine per week     Comment: OCC.    Allergies  Allergen Reactions  . Lipitor [Atorvastatin Calcium] Other (See Comments)    Reaction unknown    Current Outpatient Prescriptions  Medication Sig Dispense Refill  . aspirin EC 81 MG tablet Take 81 mg by mouth daily.      . furosemide (LASIX) 40 MG tablet Take 2 tablets (80 mg total) by mouth daily.  180 tablet  3  . L-Methylfolate-Algae-B12-B6 (METANX)  3-90.314-2-35 MG CAPS TAKE ONE CAPSULE EVERY DAY  60 capsule  4  . loratadine (CLARITIN) 10 MG tablet Take 10 mg by mouth daily.      Marland Kitchen losartan (COZAAR) 100 MG tablet Take 1 pill (100 mg) once daily.  30 tablet  3  . MAGNESIUM PO Take 500 mg by mouth daily.      . metFORMIN (GLUCOPHAGE) 1000 MG tablet TAKE 1 TABLET BY MOUTH TWICE DAILY**NEEDS FOLLOW-UP APPT.**  60 tablet  0  . metoprolol (LOPRESSOR) 50 MG tablet Take 50 mg by mouth 2 (two) times daily.      . Multiple Vitamin (MULTIVITAMIN WITH MINERALS) TABS Take 1 tablet by mouth daily.      Marland Kitchen omeprazole (PRILOSEC OTC) 20 MG tablet Take 20 mg by mouth every morning.      Marland Kitchen oxymetazoline (AFRIN) 0.05 % nasal spray Place 2 sprays into the nose daily as needed for congestion.      . potassium chloride SA (K-DUR,KLOR-CON) 20 MEQ tablet Take 1 tablet (20 mEq total) by mouth daily.  30 tablet  6  . pravastatin (PRAVACHOL) 80 MG tablet TAKE 1 TABLET (80 MG TOTAL) BY MOUTH DAILY.  30 tablet  5  . Coenzyme  Q10 200 MG capsule Take 200 mg by mouth every morning.      . Exenatide (BYDUREON) 2 MG SUSR Inject into the skin once a week.      Marland Kitchen l-methylfolate-B6-B12 (METANX) 3-35-2 MG TABS Take 1 tablet by mouth every morning.      . metFORMIN (GLUCOPHAGE) 1000 MG tablet Take 1,000-1,500 mg by mouth 2 (two) times daily with a meal. Takes 1 tablet (1,016m) every morning and takes 1 tablets (1,5035m every evening.       No current facility-administered medications for this visit.    REVIEW OF SYSTEMS: [XValu.Nieves denotes positive finding; [  ] denotes negative finding  CARDIOVASCULAR:  [ ]  chest pain   [ ]  chest pressure   [ ]  palpitations   [ ]  orthopnea   [ ]  dyspnea on exertion   [ ]  claudication   [ ]  rest pain   [ ]  DVT   [ ]  phlebitis PULMONARY:   [ ]  productive cough   [ ]  asthma   [ ]  wheezing NEUROLOGIC:   [ ]  weakness  [ ]  paresthesias  [ ]  aphasia  [ ]  amaurosis  [ ]  dizziness HEMATOLOGIC:   [ ]  bleeding problems   [ ]  clotting  disorders MUSCULOSKELETAL:  [ ]  joint pain   [ ]  joint swelling [ ]  leg swelling GASTROINTESTINAL: [ ]   blood in stool  [ ]   hematemesis GENITOURINARY:  [ ]   dysuria  [ ]   hematuria PSYCHIATRIC:  [ ]  history of major depression INTEGUMENTARY:  [ ]  rashes  [ ]  ulcers CONSTITUTIONAL:  [ ]  fever   [ ]  chills  PHYSICAL EXAM: Filed Vitals:   09/10/12 1323  BP: 171/93  Pulse: 85  Resp: 18  Height: 5' 10"  (1.778 m)  Weight: 245 lb 12.8 oz (111.494 kg)   Body mass index is 35.27 kg/(m^2). GENERAL: The patient is a well-nourished male, in no acute distress. The vital signs are documented above. CARDIOVASCULAR: There is a regular rate and rhythm. I do not detect carotid bruits. He has palpable femoral and popliteal pulses bilaterally. Both feet are warm and well-perfused. He has mild bilateral lower extremity swelling. PULMONARY: There is good air exchange bilaterally without wheezing or rales. ABDOMEN: Soft and non-tender with normal pitched bowel sounds.  MUSCULOSKELETAL: There are no major deformities or cyanosis. NEUROLOGIC: No focal weakness or paresthesias are detected. SKIN: There are no ulcers or rashes noted. PSYCHIATRIC: The patient has a normal affect.  DATA:  I have independently interpreted his duplex of his graft which shows that his femoropopliteal bypass graft is widely patent. There are some mildly elevated velocities in the common femoral artery endarterectomy site. His ABIs 100% bilaterally with triphasic Doppler signals in the dorsalis pedis and posterior tibial positions bilaterally.  MEDICAL ISSUES: The patient is doing well status post left femoropopliteal bypass grafting. He is not a smoker. I have encouraged him to stay as active as possible. I've ordered a follow up duplex of his graft and ABIs in 6 months. 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 He states  that he will make those arrangements. He does remain on aspirin and is also on a statin.  [facility] [facility]: [phone]

## 2012-09-11 ENCOUNTER — Other Ambulatory Visit: Payer: Self-pay | Admitting: *Deleted

## 2012-09-11 ENCOUNTER — Other Ambulatory Visit: Payer: Self-pay

## 2012-09-11 DIAGNOSIS — I739 Peripheral vascular disease, unspecified: Secondary | ICD-10-CM

## 2012-09-11 DIAGNOSIS — Z48812 Encounter for surgical aftercare following surgery on the circulatory system: Secondary | ICD-10-CM

## 2012-09-14 IMAGING — CR DG CHEST 2V
2 series · 2 of 2 positions shown · non-contrast
Comparison: Portable chest x-ray of 06/21/2011

CLINICAL DATA: Status post CABG 3 weeks ago, some mid chest pain
and cough

CHEST - 2 VIEW

[w chest pa]
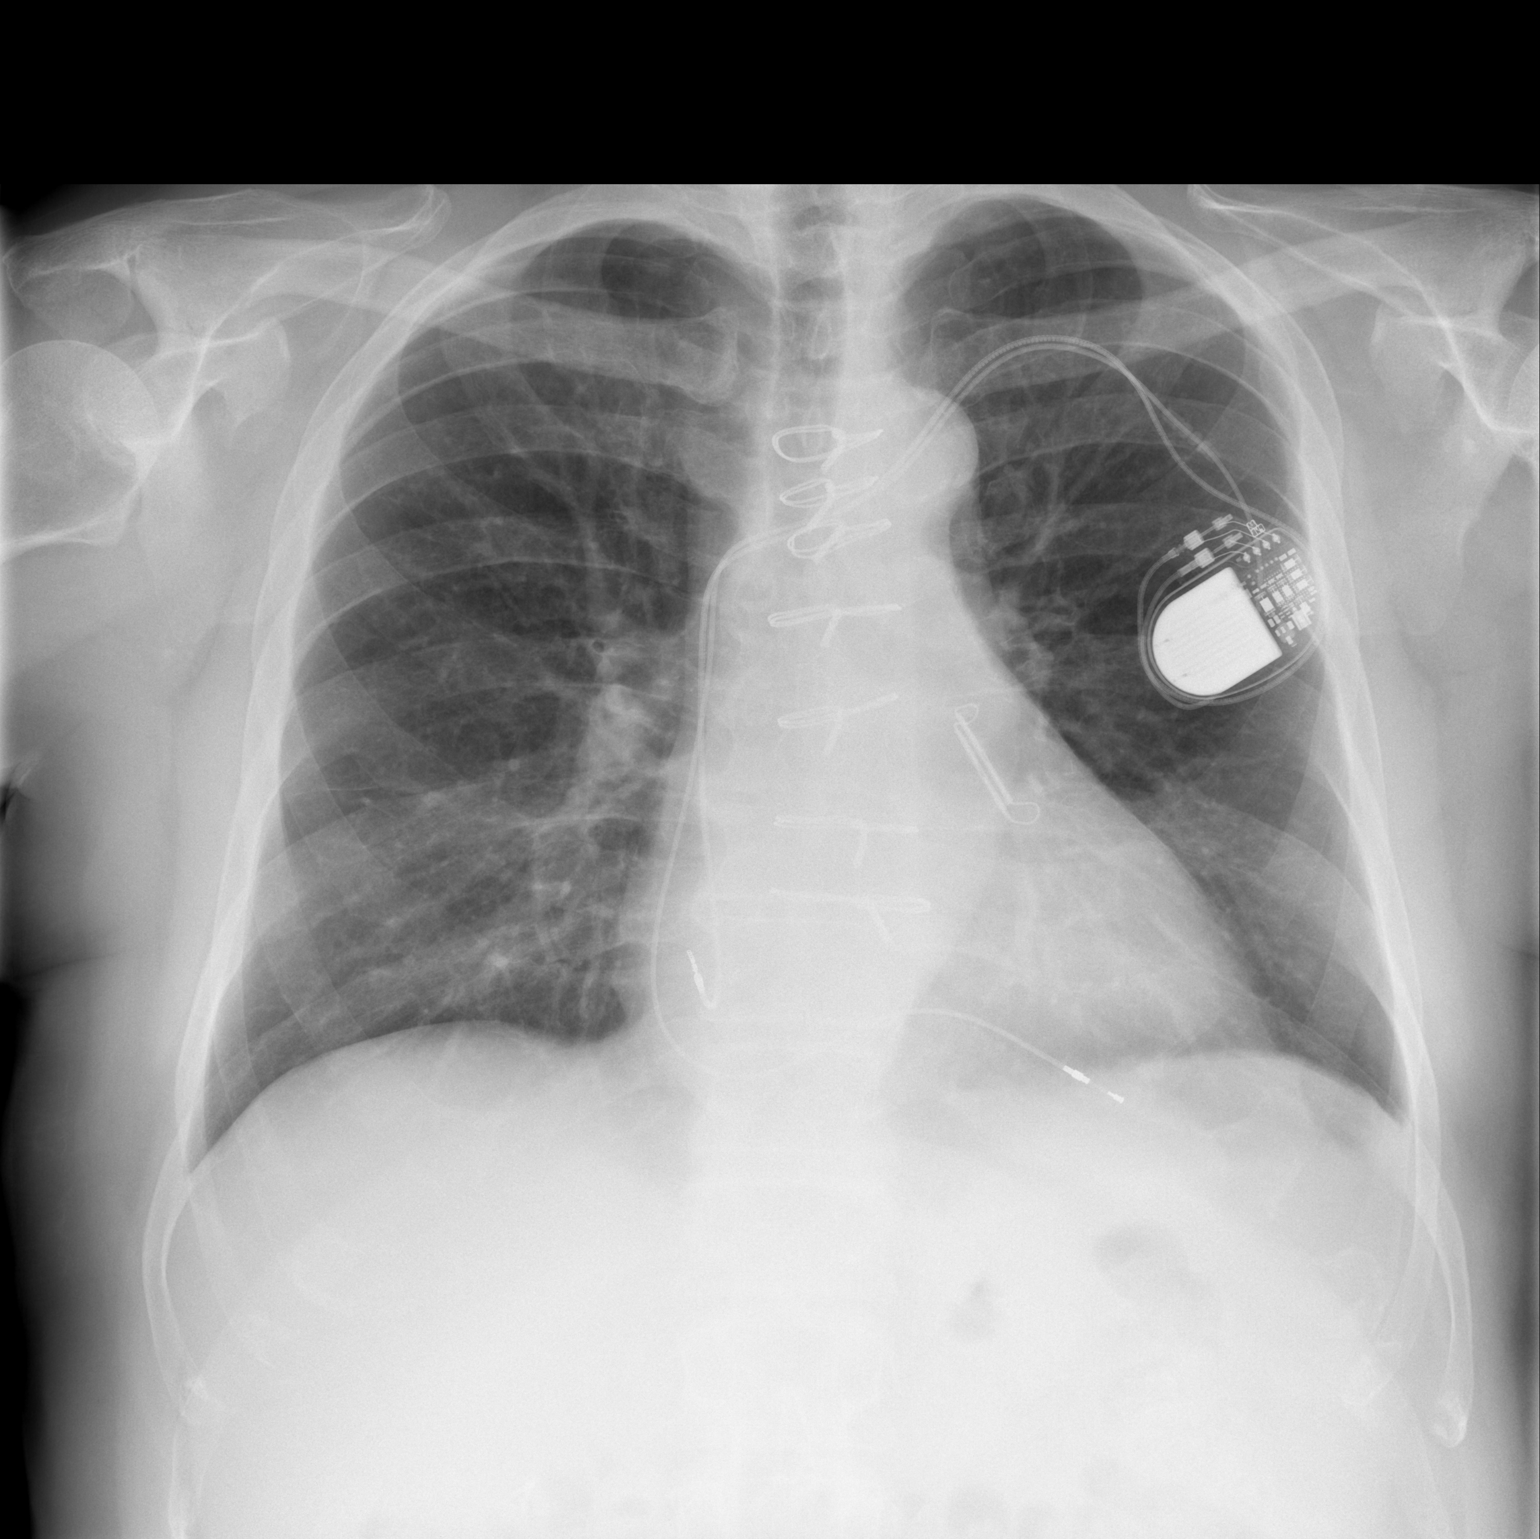

[w chest lat]
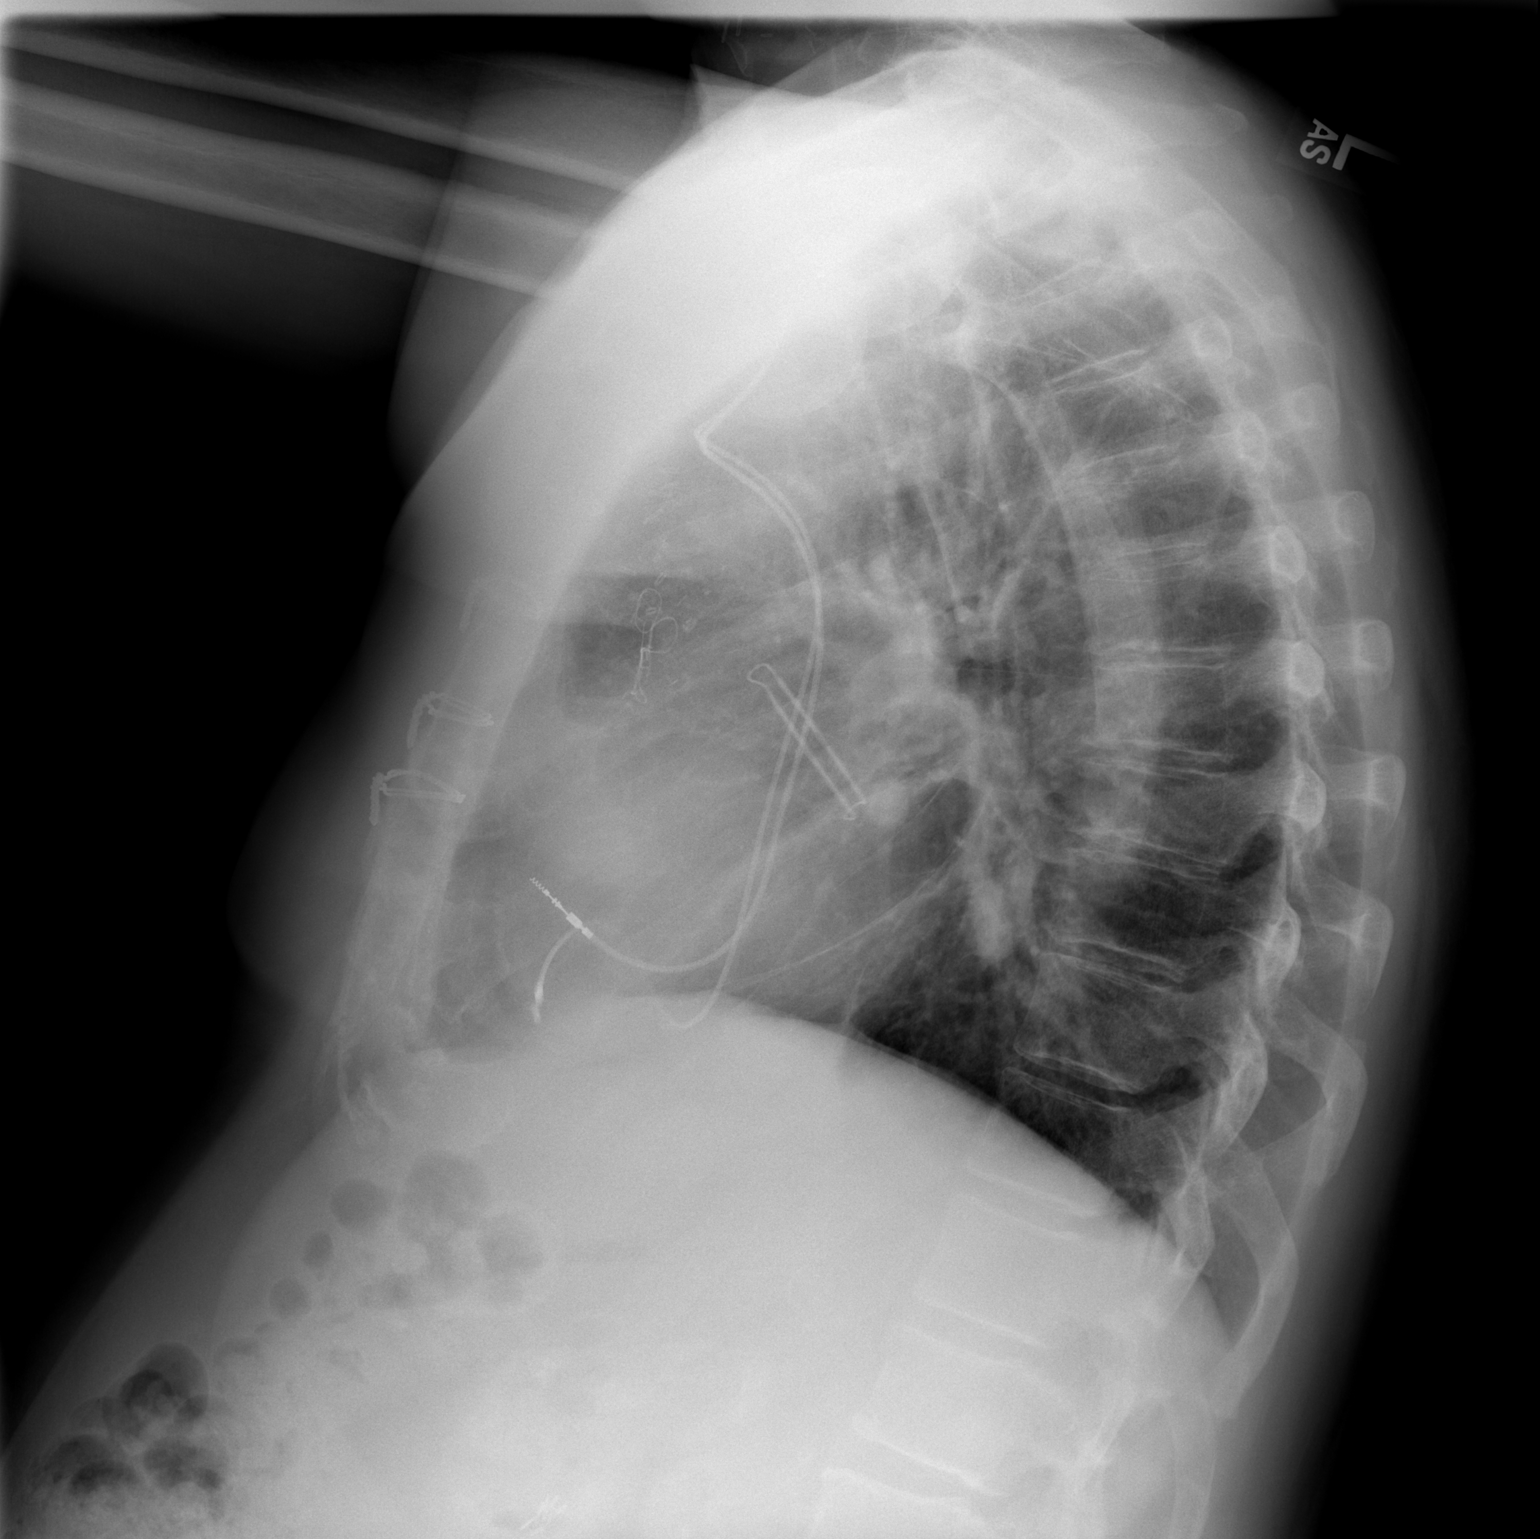

[2 of 2 positions shown; findings below may reference images not displayed]

FINDINGS: No active infiltrate or effusion is seen.  Mild
cardiomegaly is stable.  Median sternotomy sutures are noted as
well as a left atrial appendage exclusion device.  A permanent
pacemaker is present.  No acute bony abnormality is seen.
IMPRESSION: Stable mild cardiomegaly with permanent pacemaker.  No active lung
disease.

## 2012-09-15 ENCOUNTER — Other Ambulatory Visit: Payer: Self-pay | Admitting: Physician Assistant

## 2012-09-15 ENCOUNTER — Telehealth: Payer: Self-pay

## 2012-09-15 ENCOUNTER — Other Ambulatory Visit: Payer: Self-pay | Admitting: Internal Medicine

## 2012-09-15 MED ORDER — METFORMIN HCL 1000 MG PO TABS: ORAL_TABLET | ORAL | Status: DC

## 2012-09-15 NOTE — Telephone Encounter (Signed)
Pharm sent note requesting change in Rx for Metformin 1000 d/t pt reporting that he takes 1 1/2 tab Qam and 1 tab Qhs. I have checked the chart and found that the last several OVs have this sig listed. I will send in corrected Rx w/note that pt needs OV for more.

## 2012-09-18 ENCOUNTER — Telehealth: Payer: Self-pay | Admitting: Internal Medicine

## 2012-09-18 NOTE — Telephone Encounter (Signed)
Pt states he can't even walk up 3 steps without SOB, states he feels his PPM isn't working properly. Discussed with device clinic, in Dr. Olin Pia absence, - scheduled pt for device check next week

## 2012-09-18 NOTE — Telephone Encounter (Signed)
New Prob     Pt states he is experiencing SOB and would like to speak to a nurse regarding this. Please call.

## 2012-09-20 ENCOUNTER — Encounter: Payer: Self-pay | Admitting: Internal Medicine

## 2012-09-20 ENCOUNTER — Ambulatory Visit (INDEPENDENT_AMBULATORY_CARE_PROVIDER_SITE_OTHER): Payer: Federal, State, Local not specified - PPO | Admitting: Internal Medicine

## 2012-09-20 VITALS — BP 160/90 | HR 84 | Temp 98.0°F | Resp 16 | Ht 69.0 in | Wt 244.6 lb

## 2012-09-20 DIAGNOSIS — I10 Essential (primary) hypertension: Secondary | ICD-10-CM

## 2012-09-20 DIAGNOSIS — I739 Peripheral vascular disease, unspecified: Secondary | ICD-10-CM

## 2012-09-20 DIAGNOSIS — E785 Hyperlipidemia, unspecified: Secondary | ICD-10-CM

## 2012-09-20 DIAGNOSIS — E119 Type 2 diabetes mellitus without complications: Secondary | ICD-10-CM

## 2012-09-20 DIAGNOSIS — Z760 Encounter for issue of repeat prescription: Secondary | ICD-10-CM

## 2012-09-20 LAB — POCT GLYCOSYLATED HEMOGLOBIN (HGB A1C): Hemoglobin A1C: 7.1

## 2012-09-20 LAB — POCT UA - MICROSCOPIC ONLY
Bacteria, U Microscopic: NEGATIVE
Casts, Ur, LPF, POC: NEGATIVE
Crystals, Ur, HPF, POC: NEGATIVE

## 2012-09-20 LAB — COMPREHENSIVE METABOLIC PANEL
AST: 23 U/L (ref 0–37)
Albumin: 4 g/dL (ref 3.5–5.2)
Alkaline Phosphatase: 87 U/L (ref 39–117)
BUN: 22 mg/dL (ref 6–23)
Potassium: 4.7 mEq/L (ref 3.5–5.3)
Sodium: 139 mEq/L (ref 135–145)
Total Bilirubin: 0.5 mg/dL (ref 0.3–1.2)

## 2012-09-20 LAB — PSA: PSA: 0.59 ng/mL (ref <=4.00)

## 2012-09-20 LAB — LIPID PANEL
HDL: 37 mg/dL — ABNORMAL LOW (ref >39)
Total CHOL/HDL Ratio: 4.4 Ratio

## 2012-09-20 LAB — GLUCOSE, POCT (MANUAL RESULT ENTRY): POC Glucose: 184 mg/dl — AB (ref 70–99)

## 2012-09-20 LAB — POCT URINALYSIS DIPSTICK
Nitrite, UA: NEGATIVE
Urobilinogen, UA: 0.2
pH, UA: 5.5

## 2012-09-20 MED ORDER — METFORMIN HCL 1000 MG PO TABS: ORAL_TABLET | ORAL | Status: DC

## 2012-09-20 NOTE — Progress Notes (Signed)
  Subjective:    Patient ID: Edward [name], male    DOB: Jan 08, 1941, 72 y.o.   MRN: [id number]  HPI Doing well and feeling well with diabetes, htn, pvd with recent fem-pop bypass left. Tolerates meds and needs some refills. Seeing cardiologist next week for BP and pacemaker checks. Wife Myril is doing well.   Review of Systems See epic chart rev. complex    Objective:   Physical Exam  Vitals reviewed. Constitutional: He is oriented to person, place, and time. He appears well-developed and well-nourished. No distress.  HENT:  Head: Normocephalic.  Eyes: EOM are normal. No scleral icterus.  Cardiovascular: Normal rate, regular rhythm and normal heart sounds.   Pulmonary/Chest: Effort normal and breath sounds normal.  Abdominal: Soft.  Musculoskeletal: Normal range of motion.  Neurological: He is alert and oriented to person, place, and time. He has normal strength. No sensory deficit. He exhibits normal muscle tone. Coordination normal.  Skin: Skin is warm and intact.  Psychiatric: He has a normal mood and affect. His behavior is normal. Judgment and thought content normal.   Results for orders placed in visit on 09/20/12  GLUCOSE, POCT (MANUAL RESULT ENTRY)      Result Value Range   POC Glucose 184 (*) 70 - 99 mg/dl  POCT GLYCOSYLATED HEMOGLOBIN (HGB A1C)      Result Value Range   Hemoglobin A1C 7.1    POCT URINALYSIS DIPSTICK      Result Value Range   Color, UA yellow     Clarity, UA clear     Glucose, UA neg     Bilirubin, UA neg     Ketones, UA neg     Spec Grav, UA >=1.030     Blood, UA tr-lysed     pH, UA 5.5     Protein, UA >=300     Urobilinogen, UA 0.2     Nitrite, UA neg     Leukocytes, UA Negative    POCT UA - MICROSCOPIC ONLY      Result Value Range   WBC, Ur, HPF, POC 1-3     RBC, urine, microscopic 1-3     Bacteria, U Microscopic neg     Mucus, UA moderate     Epithelial cells, urine per micros 0-1     Crystals, Ur, HPF, POC neg     Casts, Ur, LPF,  POC neg     Yeast, UA neg            Assessment & Plan:  NIDDM/HTN/Pacemaker/Lipids RF meds 6 months Letter for flying

## 2012-09-20 NOTE — Patient Instructions (Signed)
Calorie Counting Diet A calorie counting diet requires you to eat the number of calories that are right for you in a day. Calories are the measurement of how much energy you get from the food you eat. Eating the right amount of calories is important for staying at a healthy weight. If you eat too many calories, your body will store them as fat and you may gain weight. If you eat too few calories, you may lose weight. Counting the number of calories you eat during a day will help you know if you are eating the right amount. A Registered Dietitian can determine how many calories you need in a day. The amount of calories needed varies from person to person. If your goal is to lose weight, you will need to eat fewer calories. Losing weight can benefit you if you are overweight or have health problems such as heart disease, high blood pressure, or diabetes. If your goal is to gain weight, you will need to eat more calories. Gaining weight may be necessary if you have a certain health problem that causes your body to need more energy. TIPS Whether you are increasing or decreasing the number of calories you eat during a day, it may be hard to get used to changes in what you eat and drink. The following are tips to help you keep track of the number of calories you eat.  Measure foods at home with measuring cups. This helps you know the amount of food and number of calories you are eating.  Restaurants often serve food in amounts that are larger than 1 serving. While eating out, estimate how many servings of a food you are given. For example, a serving of cooked rice is  cup or about the size of half of a fist. Knowing serving sizes will help you be aware of how much food you are eating at restaurants.  Ask for smaller portion sizes or child-size portions at restaurants.  Plan to eat half of a meal at a restaurant. Take the rest home or share the other half with a friend.  Read the Nutrition Facts panel on  food labels for calorie content and serving size. You can find out how many servings are in a package, the size of a serving, and the number of calories each serving has.  For example, a package might contain 3 cookies. The Nutrition Facts panel on that package says that 1 serving is 1 cookie. Below that, it will say there are 3 servings in the container. The calories section of the Nutrition Facts label says there are 90 calories. This means there are 90 calories in 1 cookie (1 serving). If you eat 1 cookie you have eaten 90 calories. If you eat all 3 cookies, you have eaten 270 calories (3 servings x 90 calories = 270 calories). The list below tells you how big or small some common portion sizes are.  1 oz.........4 stacked dice.  3 oz........Marland KitchenDeck of cards.  1 tsp.......Marland KitchenTip of little finger.  1 tbs......Marland KitchenMarland KitchenThumb.  2 tbs.......Marland KitchenGolf ball.   cup......Marland KitchenHalf of a fist.  1 cup.......Marland KitchenA fist. KEEP A FOOD LOG Write down every food item you eat, the amount you eat, and the number of calories in each food you eat during the day. At the end of the day, you can add up the total number of calories you have eaten. It may help to keep a list like the one below. Find out the calorie information by reading the  Nutrition Facts panel on food labels. Breakfast  Bran cereal (1 cup, 110 calories).  Fat-free milk ( cup, 45 calories). Snack  Apple (1 medium, 80 calories). Lunch  Spinach (1 cup, 20 calories).  Tomato ( medium, 20 calories).  Chicken breast strips (3 oz, 165 calories).  Shredded cheddar cheese ( cup, 110 calories).  Light [place] dressing (2 tbs, 60 calories).  Whole-wheat bread (1 slice, 80 calories).  Tub margarine (1 tsp, 35 calories).  Vegetable soup (1 cup, 160 calories). Dinner  Pork chop (3 oz, 190 calories).  Brown rice (1 cup, 215 calories).  Steamed broccoli ( cup, 20 calories).  Strawberries (1  cup, 65 calories).  Whipped cream (1 tbs, 50  calories). Daily Calorie Total: 3729 Document Released: 01/01/2005 Document Revised: 03/26/2011 Document Reviewed: 06/28/2006 [facility] Patient Information 2014 [place].

## 2012-09-22 ENCOUNTER — Encounter: Payer: Self-pay | Admitting: Family Medicine

## 2012-09-23 ENCOUNTER — Ambulatory Visit (INDEPENDENT_AMBULATORY_CARE_PROVIDER_SITE_OTHER): Payer: Federal, State, Local not specified - PPO | Admitting: Internal Medicine

## 2012-09-23 ENCOUNTER — Encounter: Payer: Self-pay | Admitting: Internal Medicine

## 2012-09-23 ENCOUNTER — Ambulatory Visit (INDEPENDENT_AMBULATORY_CARE_PROVIDER_SITE_OTHER)
Admission: RE | Admit: 2012-09-23 | Discharge: 2012-09-23 | Disposition: A | Discharge status: Home / Self Care | Payer: Federal, State, Local not specified - PPO | Source: Ambulatory Visit | Attending: Internal Medicine | Admitting: Internal Medicine

## 2012-09-23 ENCOUNTER — Telehealth: Payer: Self-pay

## 2012-09-23 VITALS — BP 141/89 | HR 85 | Ht 69.5 in | Wt 243.0 lb

## 2012-09-23 DIAGNOSIS — T82110A Breakdown (mechanical) of cardiac electrode, initial encounter: Secondary | ICD-10-CM | POA: Insufficient documentation

## 2012-09-23 DIAGNOSIS — R0609 Other forms of dyspnea: Secondary | ICD-10-CM

## 2012-09-23 DIAGNOSIS — I4891 Unspecified atrial fibrillation: Secondary | ICD-10-CM

## 2012-09-23 DIAGNOSIS — I495 Sick sinus syndrome: Secondary | ICD-10-CM

## 2012-09-23 DIAGNOSIS — Z950 Presence of cardiac pacemaker: Secondary | ICD-10-CM

## 2012-09-23 DIAGNOSIS — T82190A Other mechanical complication of cardiac electrode, initial encounter: Secondary | ICD-10-CM

## 2012-09-23 DIAGNOSIS — I498 Other specified cardiac arrhythmias: Secondary | ICD-10-CM

## 2012-09-23 LAB — PACEMAKER DEVICE OBSERVATION
AL IMPEDENCE PM: 473 Ohm
BMOD-0001RV: HIGH
BMOD-0003RV: 30
BMOD-0005RV: 95 {beats}/min
BRDY-0002RV: 70 {beats}/min
RV LEAD AMPLITUDE: 15.68 mv
RV LEAD IMPEDENCE PM: 715 Ohm
VENTRICULAR PACING PM: 66

## 2012-09-23 NOTE — Assessment & Plan Note (Addendum)
Discontinue this issue. It may be related to overzealous rate response   we've reprogrammed his heart rate response and with ambulation for it when tonight he is that of 110. I should note that interrogation of his device demonstrated that 15% daytime heart rate for faster than 100 per minute  There is also atrial failure to capture

## 2012-09-23 NOTE — Assessment & Plan Note (Signed)
There is no atrial  Lead  capture or sensing. We'll check a chest x-ray and look for dislodgment echocardiogram to look at left ventricular function.

## 2012-09-23 NOTE — Progress Notes (Signed)
Patient Care Team: [name], MD as PCP - General (Internal Medicine) Elayne Snare, MD as Attending Physician (Endocrinology) Angelia Mould, MD as Attending Physician (Vascular Surgery) Sherren Mocha, MD as Attending Physician (Cardiology) [name], MD as Attending Physician (Cardiology) Gaye Pollack, MD as Attending Physician (Cardiothoracic Surgery) Deboraha Sprang, MD as Attending Physician (Cardiology)   HPI  Edward [name] is a 72 y.o. male Seen in followup for pacemaker implanted for sinus node dysfunction and continue rapid junctional rhythm. This emerged post bypass surgery with concomitant maze. And we last saw him he had complaints of dyspnea on exertion and we modified his diuretics.  This improved his edema. He continues to complain of shortness of breath. At the last visit we increased his atrial rate to try to overdrive his junctional rhythm. He complains that his shortness of breath with exertion has continued to worsen.     Past Medical History  Diagnosis Date  . A-fib   . HTN (hypertension)   . Diabetes mellitus   . Dyslipidemia   . PVD (peripheral vascular disease)     s/p Right CEA March, 2011,   . Claudication   . Hyperlipidemia   . GERD (gastroesophageal reflux disease)   . Diabetic neuropathy   . Pacemaker   . Stroke     ?  Marland Kitchen Arthritis   . Anemia     ?  . Diastolic CHF 09/16/6382    Past Surgical History  Procedure Laterality Date  . Carotid endarterectomy      RIGHT  . Cholecystectomy    . Tonsillectomy and adenoidectomy    . Knee surgery    . Coronary artery bypass graft  06/14/2011    Procedure: CORONARY ARTERY BYPASS GRAFTING (CABG);  Surgeon: Gaye Pollack, MD;  Location: [place];  Service: Open Heart Surgery;  Laterality: N/A;  Coronary Artery Bypass Graft on pump times six;  utilizing internal mammary artery and right greater saphenous vein harvested endoscopically.  . Maze  06/14/2011    Procedure: MAZE;  Surgeon: Gaye Pollack, MD;  Location: [place];  Service: Open Heart Surgery;  Laterality: N/A;  Ligate left atrial appendage  . Circumcision    . Femoral-popliteal bypass graft Left 03/06/2012    Procedure: BYPASS GRAFT FEMORAL-POPLITEAL ARTERY;  Surgeon: Angelia Mould, MD;  Location: [facility] OR;  Service: Vascular;  Laterality: Left;  Left Femoral - Below Knee Popliteal Bypass Graft with Vein and Intraoperative Arteriogram.    Current Outpatient Prescriptions  Medication Sig Dispense Refill  . aspirin EC 81 MG tablet Take 81 mg by mouth daily.      . Coenzyme Q10 200 MG capsule Take 200 mg by mouth every morning.      Marland Kitchen exenatide (BYETTA 5 MCG PEN) 5 MCG/0.02ML SOPN injection Inject 2 mcg into the skin once a week.      . furosemide (LASIX) 40 MG tablet Take 2 tablets (80 mg total) by mouth daily.  180 tablet  3  . L-Methylfolate-Algae-B12-B6 (METANX) 3-90.314-2-35 MG CAPS TAKE ONE CAPSULE EVERY DAY  60 capsule  4  . l-methylfolate-B6-B12 (METANX) 3-35-2 MG TABS Take 1 tablet by mouth every morning.      . loratadine (CLARITIN) 10 MG tablet Take 10 mg by mouth daily.      Marland Kitchen losartan (COZAAR) 100 MG tablet Take 1 pill (100 mg) once daily.  30 tablet  3  . MAGNESIUM PO Take 500 mg by mouth daily.      Marland Kitchen  metFORMIN (GLUCOPHAGE) 1000 MG tablet Take 1 1/2 tablets (1,588m) every morning and 1 tablet (1,0064m every evening. PATIENT NEEDS OFFICE VISIT FOR ADDITIONAL REFILLS  225 tablet  1  . metoprolol (LOPRESSOR) 50 MG tablet Take 50 mg by mouth 2 (two) times daily.      . metoprolol (LOPRESSOR) 50 MG tablet TAKE 1 TABLET BY MOUTH TWICE DAILY  60 tablet  6  . Multiple Vitamin (MULTIVITAMIN WITH MINERALS) TABS Take 1 tablet by mouth daily.      . Marland Kitchenmeprazole (PRILOSEC OTC) 20 MG tablet Take 20 mg by mouth every morning.      . Marland Kitchenxymetazoline (AFRIN) 0.05 % nasal spray Place 2 sprays into the nose daily as needed for congestion.      . potassium chloride SA (K-DUR,KLOR-CON) 20 MEQ tablet Take 1 tablet (20 mEq total)  by mouth daily.  30 tablet  6  . pravastatin (PRAVACHOL) 80 MG tablet TAKE 1 TABLET (80 MG TOTAL) BY MOUTH DAILY.  30 tablet  5   No current facility-administered medications for this visit.    Allergies  Allergen Reactions  . Lipitor [Atorvastatin Calcium] Other (See Comments)    Reaction unknown    Review of Systems negative except from HPI and PMH  Physical Exam  BP 141/89  Pulse 85  Ht 5' 9.5" (1.765 m)  Wt 243 lb (110.224 kg)  BMI 35.38 kg/m2  Well developed and nourished in no acute distress HENT normal Neck supple with JVP-flat Clear Regular rate and rhythm, no murmurs or gallops Abd-soft with active BS No Clubbing cyanosis edema Skin-warm and dry A & Oriented  Grossly normal sensory and motor function   ECG demonstrates AV pacing without atrial capture  Assessment and  Plan

## 2012-09-23 NOTE — Assessment & Plan Note (Signed)
The patient's device was interrogated and the information was fully reviewed.  The device was reprogrammed to  Improve

## 2012-09-23 NOTE — Assessment & Plan Note (Signed)
No recurrent afb

## 2012-09-23 NOTE — Telephone Encounter (Signed)
Per Dr. Elder Cyphers called patient to let him know a letter was ready for him to pick up at [facility]. LMOM. Letter in pick up drawer.

## 2012-09-23 NOTE — Patient Instructions (Addendum)
Your physician recommends that you continue on your current medications as directed. Please refer to the Current Medication list given to you today.  Please call Trinidad Curet RN (Dr. Olin Pia nurse) next week with an update on your status.  Your physician recommends that you schedule a follow-up appointment in: 6 months with device clinic   Your physician has requested that you have an echocardiogram. Echocardiography is a painless test that uses sound waves to create images of your heart. It provides your doctor with information about the size and shape of your heart and how well your heart's chambers and valves are working. This procedure takes approximately one hour. There are no restrictions for this procedure.   A chest x-ray takes a picture of the organs and structures inside the chest, including the heart, lungs, and blood vessels. This test can show several things, including, whether the heart is enlarges; whether fluid is building up in the lungs; and whether pacemaker / defibrillator leads are still in place.

## 2012-09-23 NOTE — Assessment & Plan Note (Signed)
I don't know that there is ablation solution for this problem. He is not inclined or medical solution. We'll follow for right now. In the event that his dyspnea persists, we'll get an echo at his next visit.

## 2012-09-29 ENCOUNTER — Other Ambulatory Visit: Payer: Self-pay | Admitting: Physician Assistant

## 2012-10-06 ENCOUNTER — Encounter: Payer: Federal, State, Local not specified - PPO | Admitting: Internal Medicine

## 2012-10-06 ENCOUNTER — Ambulatory Visit (HOSPITAL_COMMUNITY): Payer: Federal, State, Local not specified - PPO | Attending: Cardiology

## 2012-10-06 DIAGNOSIS — I498 Other specified cardiac arrhythmias: Secondary | ICD-10-CM

## 2012-10-06 DIAGNOSIS — R0609 Other forms of dyspnea: Secondary | ICD-10-CM | POA: Insufficient documentation

## 2012-10-06 DIAGNOSIS — R0989 Other specified symptoms and signs involving the circulatory and respiratory systems: Secondary | ICD-10-CM | POA: Insufficient documentation

## 2012-10-06 DIAGNOSIS — I739 Peripheral vascular disease, unspecified: Secondary | ICD-10-CM | POA: Insufficient documentation

## 2012-10-06 DIAGNOSIS — Z951 Presence of aortocoronary bypass graft: Secondary | ICD-10-CM | POA: Insufficient documentation

## 2012-10-06 DIAGNOSIS — E119 Type 2 diabetes mellitus without complications: Secondary | ICD-10-CM | POA: Insufficient documentation

## 2012-10-06 DIAGNOSIS — E785 Hyperlipidemia, unspecified: Secondary | ICD-10-CM | POA: Insufficient documentation

## 2012-10-06 DIAGNOSIS — I10 Essential (primary) hypertension: Secondary | ICD-10-CM | POA: Insufficient documentation

## 2012-10-06 DIAGNOSIS — R0602 Shortness of breath: Secondary | ICD-10-CM

## 2012-10-06 DIAGNOSIS — I059 Rheumatic mitral valve disease, unspecified: Secondary | ICD-10-CM | POA: Insufficient documentation

## 2012-10-06 DIAGNOSIS — Z8673 Personal history of transient ischemic attack (TIA), and cerebral infarction without residual deficits: Secondary | ICD-10-CM | POA: Insufficient documentation

## 2012-10-06 NOTE — Progress Notes (Signed)
Echocardiogram performed.  

## 2012-10-20 ENCOUNTER — Encounter: Payer: Self-pay | Admitting: Internal Medicine

## 2012-10-21 ENCOUNTER — Telehealth: Payer: Self-pay | Admitting: Internal Medicine

## 2012-10-21 NOTE — Telephone Encounter (Signed)
I spoke with Mr. Edward [name]. We will plan a 2 part approach. The first would be to use place a transvenous right atrial catheter to see if there is atrial electrical activity in the event that there is not really no need for pacemaker revision. In the event that we find atrial activity we will revise his pacemaker. Following, if junctional rhythm versus as an issue I discussed briefly with Dr. Tally Due thge  possibility of ablation of the junctional focus

## 2012-10-30 ENCOUNTER — Telehealth: Payer: Self-pay | Admitting: *Deleted

## 2012-10-30 ENCOUNTER — Encounter: Payer: Self-pay | Admitting: *Deleted

## 2012-10-30 DIAGNOSIS — I5030 Unspecified diastolic (congestive) heart failure: Secondary | ICD-10-CM

## 2012-10-30 NOTE — Telephone Encounter (Signed)
Spoke with patient to set up atrial lead revision procedure - scheduled 11/05/2012 at 1:30pm. Lab scheduled for 10/31/2012. Instructions discussed with patient and wife who both verbalized understanding. Letter of instructions left at front desk for patient to pick up tomorrow when he comes for lab work.

## 2012-10-31 ENCOUNTER — Other Ambulatory Visit (INDEPENDENT_AMBULATORY_CARE_PROVIDER_SITE_OTHER): Payer: Federal, State, Local not specified - PPO

## 2012-10-31 DIAGNOSIS — I5030 Unspecified diastolic (congestive) heart failure: Secondary | ICD-10-CM

## 2012-10-31 DIAGNOSIS — I509 Heart failure, unspecified: Secondary | ICD-10-CM

## 2012-10-31 LAB — BASIC METABOLIC PANEL
CO2: 25 mEq/L (ref 19–32)
Calcium: 8.8 mg/dL (ref 8.4–10.5)
Chloride: 103 mEq/L (ref 96–112)
Creatinine, Ser: 1.4 mg/dL (ref 0.4–1.5)
Glucose, Bld: 129 mg/dL — ABNORMAL HIGH (ref 70–99)

## 2012-10-31 LAB — CBC WITH DIFFERENTIAL/PLATELET
Basophils Absolute: 0.1 10*3/uL (ref 0.0–0.1)
Basophils Relative: 0.6 % (ref 0.0–3.0)
Eosinophils Absolute: 0.5 10*3/uL (ref 0.0–0.7)
Lymphocytes Relative: 13.4 % (ref 12.0–46.0)
MCHC: 32.5 g/dL (ref 30.0–36.0)
MCV: 66.8 fl — ABNORMAL LOW (ref 78.0–100.0)
Monocytes Absolute: 1 10*3/uL (ref 0.1–1.0)
Neutro Abs: 7.3 10*3/uL (ref 1.4–7.7)
Neutrophils Relative %: 71.8 % (ref 43.0–77.0)
RDW: 20 % — ABNORMAL HIGH (ref 11.5–14.6)

## 2012-11-04 MED ORDER — SODIUM CHLORIDE 0.9 % IR SOLN
80.0000 mg | Status: DC
  Filled 2012-11-04: qty 2

## 2012-11-04 MED ORDER — CEFAZOLIN SODIUM-DEXTROSE 2-3 GM-% IV SOLR
2.0000 g | INTRAVENOUS | Status: DC
  Filled 2012-11-04 (×2): qty 50

## 2012-11-05 ENCOUNTER — Ambulatory Visit (HOSPITAL_COMMUNITY)
Admission: RE | Admit: 2012-11-05 | Discharge: 2012-11-06 | Disposition: A | Discharge status: Home / Self Care | Payer: Federal, State, Local not specified - PPO | Source: Ambulatory Visit | Attending: Internal Medicine | Admitting: Internal Medicine

## 2012-11-05 ENCOUNTER — Encounter (HOSPITAL_COMMUNITY): Payer: Self-pay | Admitting: Pharmacy Technician

## 2012-11-05 ENCOUNTER — Encounter (HOSPITAL_COMMUNITY)
Admission: RE | Disposition: A | Discharge status: Home / Self Care | Payer: Self-pay | Source: Ambulatory Visit | Attending: Internal Medicine

## 2012-11-05 ENCOUNTER — Encounter (HOSPITAL_COMMUNITY): Payer: Self-pay | Admitting: General Practice

## 2012-11-05 DIAGNOSIS — Z950 Presence of cardiac pacemaker: Secondary | ICD-10-CM

## 2012-11-05 DIAGNOSIS — K219 Gastro-esophageal reflux disease without esophagitis: Secondary | ICD-10-CM | POA: Insufficient documentation

## 2012-11-05 DIAGNOSIS — T82190A Other mechanical complication of cardiac electrode, initial encounter: Secondary | ICD-10-CM

## 2012-11-05 DIAGNOSIS — I4891 Unspecified atrial fibrillation: Secondary | ICD-10-CM

## 2012-11-05 DIAGNOSIS — E119 Type 2 diabetes mellitus without complications: Secondary | ICD-10-CM | POA: Insufficient documentation

## 2012-11-05 DIAGNOSIS — I70219 Atherosclerosis of native arteries of extremities with intermittent claudication, unspecified extremity: Secondary | ICD-10-CM

## 2012-11-05 DIAGNOSIS — E785 Hyperlipidemia, unspecified: Secondary | ICD-10-CM

## 2012-11-05 DIAGNOSIS — Z951 Presence of aortocoronary bypass graft: Secondary | ICD-10-CM | POA: Insufficient documentation

## 2012-11-05 DIAGNOSIS — I498 Other specified cardiac arrhythmias: Secondary | ICD-10-CM

## 2012-11-05 DIAGNOSIS — M79609 Pain in unspecified limb: Secondary | ICD-10-CM

## 2012-11-05 DIAGNOSIS — Z8679 Personal history of other diseases of the circulatory system: Secondary | ICD-10-CM

## 2012-11-05 DIAGNOSIS — I509 Heart failure, unspecified: Secondary | ICD-10-CM | POA: Insufficient documentation

## 2012-11-05 DIAGNOSIS — I739 Peripheral vascular disease, unspecified: Secondary | ICD-10-CM | POA: Insufficient documentation

## 2012-11-05 DIAGNOSIS — Z48812 Encounter for surgical aftercare following surgery on the circulatory system: Secondary | ICD-10-CM

## 2012-11-05 DIAGNOSIS — D649 Anemia, unspecified: Secondary | ICD-10-CM

## 2012-11-05 DIAGNOSIS — Y831 Surgical operation with implant of artificial internal device as the cause of abnormal reaction of the patient, or of later complication, without mention of misadventure at the time of the procedure: Secondary | ICD-10-CM | POA: Insufficient documentation

## 2012-11-05 DIAGNOSIS — I701 Atherosclerosis of renal artery: Secondary | ICD-10-CM

## 2012-11-05 DIAGNOSIS — I2510 Atherosclerotic heart disease of native coronary artery without angina pectoris: Secondary | ICD-10-CM

## 2012-11-05 DIAGNOSIS — I495 Sick sinus syndrome: Secondary | ICD-10-CM | POA: Insufficient documentation

## 2012-11-05 DIAGNOSIS — I5030 Unspecified diastolic (congestive) heart failure: Secondary | ICD-10-CM

## 2012-11-05 DIAGNOSIS — R600 Localized edema: Secondary | ICD-10-CM

## 2012-11-05 DIAGNOSIS — R0609 Other forms of dyspnea: Secondary | ICD-10-CM

## 2012-11-05 DIAGNOSIS — I447 Left bundle-branch block, unspecified: Secondary | ICD-10-CM

## 2012-11-05 DIAGNOSIS — I10 Essential (primary) hypertension: Secondary | ICD-10-CM | POA: Insufficient documentation

## 2012-11-05 HISTORY — PX: ELECTROPHYSIOLOGY STUDY: SHX5467

## 2012-11-05 HISTORY — DX: Shortness of breath: R06.02

## 2012-11-05 HISTORY — PX: LEAD REVISION: SHX5945

## 2012-11-05 HISTORY — DX: Type 2 diabetes mellitus without complications: E11.9

## 2012-11-05 LAB — GLUCOSE, CAPILLARY: Glucose-Capillary: 97 mg/dL (ref 70–99)

## 2012-11-05 LAB — SURGICAL PCR SCREEN: Staphylococcus aureus: NEGATIVE

## 2012-11-05 SURGERY — LEAD REVISION
Anesthesia: LOCAL

## 2012-11-05 MED ORDER — FENTANYL CITRATE 0.05 MG/ML IJ SOLN
INTRAMUSCULAR | Status: AC
  Filled 2012-11-05: qty 2

## 2012-11-05 MED ORDER — LIDOCAINE HCL (PF) 1 % IJ SOLN
INTRAMUSCULAR | Status: AC
  Filled 2012-11-05: qty 60

## 2012-11-05 MED ORDER — LABETALOL HCL 5 MG/ML IV SOLN
INTRAVENOUS | Status: AC
  Filled 2012-11-05: qty 4

## 2012-11-05 MED ORDER — MIDAZOLAM HCL 5 MG/5ML IJ SOLN
INTRAMUSCULAR | Status: AC
  Filled 2012-11-05: qty 5

## 2012-11-05 MED ORDER — FUROSEMIDE 10 MG/ML IJ SOLN
80.0000 mg | Freq: Once | INTRAMUSCULAR | Status: AC
  Administered 2012-11-05: 22:00:00 80 mg via INTRAVENOUS
  Filled 2012-11-05: qty 8

## 2012-11-05 MED ORDER — METFORMIN HCL 500 MG PO TABS
1000.0000 mg | ORAL_TABLET | Freq: Two times a day (BID) | ORAL | Status: DC
  Filled 2012-11-05 (×3): qty 2

## 2012-11-05 MED ORDER — ACETAMINOPHEN 325 MG PO TABS: 325.0000 mg | ORAL_TABLET | ORAL | Status: DC | PRN

## 2012-11-05 MED ORDER — CEFAZOLIN SODIUM 1-5 GM-% IV SOLN
1.0000 g | Freq: Four times a day (QID) | INTRAVENOUS | Status: AC
  Administered 2012-11-05 – 2012-11-06 (×3): 1 g via INTRAVENOUS
  Filled 2012-11-05 (×4): qty 50

## 2012-11-05 MED ORDER — LIDOCAINE HCL (PF) 1 % IJ SOLN
INTRAMUSCULAR | Status: AC
  Filled 2012-11-05: qty 30

## 2012-11-05 MED ORDER — ONDANSETRON HCL 4 MG/2ML IJ SOLN: 4.0000 mg | Freq: Four times a day (QID) | INTRAMUSCULAR | Status: DC | PRN

## 2012-11-05 MED ORDER — MUPIROCIN 2 % EX OINT
TOPICAL_OINTMENT | Freq: Once | CUTANEOUS | Status: AC
  Administered 2012-11-05: 1 via NASAL

## 2012-11-05 MED ORDER — SODIUM CHLORIDE 0.9 % IV SOLN
INTRAVENOUS | Status: DC
  Administered 2012-11-05: 15:00:00 via INTRAVENOUS

## 2012-11-05 MED ORDER — POTASSIUM CHLORIDE CRYS ER 20 MEQ PO TBCR
20.0000 meq | EXTENDED_RELEASE_TABLET | Freq: Every day | ORAL | Status: DC
  Administered 2012-11-05 – 2012-11-06 (×2): 20 meq via ORAL
  Filled 2012-11-05 (×2): qty 1

## 2012-11-05 MED ORDER — CHLORHEXIDINE GLUCONATE 4 % EX LIQD
60.0000 mL | Freq: Once | CUTANEOUS | Status: AC
  Administered 2012-11-05: 4 via TOPICAL
  Filled 2012-11-05: qty 60

## 2012-11-05 MED ORDER — LORATADINE 10 MG PO TABS
10.0000 mg | ORAL_TABLET | Freq: Every day | ORAL | Status: DC
Start: 2012-11-05 — End: 2012-11-06
  Administered 2012-11-05 – 2012-11-06 (×2): 10 mg via ORAL
  Filled 2012-11-05 (×2): qty 1

## 2012-11-05 MED ORDER — METOPROLOL TARTRATE 50 MG PO TABS
50.0000 mg | ORAL_TABLET | Freq: Two times a day (BID) | ORAL | Status: DC
  Administered 2012-11-05 – 2012-11-06 (×2): 50 mg via ORAL
  Filled 2012-11-05 (×3): qty 1
  Filled 2012-11-05: qty 2

## 2012-11-05 MED ORDER — ADULT MULTIVITAMIN W/MINERALS CH
1.0000 | ORAL_TABLET | Freq: Every day | ORAL | Status: DC
  Administered 2012-11-05 – 2012-11-06 (×2): 1 via ORAL
  Filled 2012-11-05 (×2): qty 1

## 2012-11-05 MED ORDER — ASPIRIN EC 81 MG PO TBEC
81.0000 mg | DELAYED_RELEASE_TABLET | Freq: Every day | ORAL | Status: DC
Start: 2012-11-05 — End: 2012-11-06
  Administered 2012-11-06: 81 mg via ORAL
  Filled 2012-11-05 (×2): qty 1

## 2012-11-05 MED ORDER — FUROSEMIDE 80 MG PO TABS
80.0000 mg | ORAL_TABLET | Freq: Every day | ORAL | Status: DC
Start: 2012-11-05 — End: 2012-11-06
  Administered 2012-11-06: 09:00:00 80 mg via ORAL
  Filled 2012-11-05 (×2): qty 1

## 2012-11-05 MED ORDER — LOSARTAN POTASSIUM 50 MG PO TABS
100.0000 mg | ORAL_TABLET | Freq: Every day | ORAL | Status: DC
  Administered 2012-11-05 – 2012-11-06 (×2): 100 mg via ORAL
  Filled 2012-11-05 (×3): qty 2

## 2012-11-05 MED ORDER — BUPIVACAINE HCL (PF) 0.25 % IJ SOLN
INTRAMUSCULAR | Status: AC
  Filled 2012-11-05: qty 30

## 2012-11-05 MED ORDER — SODIUM CHLORIDE 0.9 % IV SOLN: INTRAVENOUS | Status: AC

## 2012-11-05 MED ORDER — MUPIROCIN 2 % EX OINT
TOPICAL_OINTMENT | CUTANEOUS | Status: AC
  Administered 2012-11-05: 1 via NASAL
  Filled 2012-11-05: qty 22

## 2012-11-05 NOTE — Progress Notes (Signed)
Pt hypertensive  CVP>20 in procedure today Lasix given Cozaar at 100 May need more meds at discharge

## 2012-11-05 NOTE — H&P (Signed)
.      Patient Care Team: [name], MD as PCP - General (Internal Medicine) Elayne Snare, MD as Attending Physician (Endocrinology) Angelia Mould, MD as Attending Physician (Vascular Surgery) Sherren Mocha, MD as Attending Physician (Cardiology) [name], MD as Attending Physician (Cardiology) Gaye Pollack, MD as Attending Physician (Cardiothoracic Surgery) Deboraha Sprang, MD as Attending Physician (Cardiology)   HPI  Edward [name] is a 72 y.o. male Admitted for evaluation of atrial electircal activity and possible pacemaker revision  He underwent  pacemaker implantion 2013 for sinus node dysfunction and continue rapid junctional rhythm. This emerged post bypass surgery with concomitant maze.   He has complainted of DOE and at last evaluation it was noted taht there was no atrial electrical activity sensed or paced  It is not clear whetehr this is a lead issue or a consequence of the maze operation.    Past Medical History  Diagnosis Date  . A-fib   . HTN (hypertension)   . Diabetes mellitus   . Dyslipidemia   . PVD (peripheral vascular disease)     s/p Right CEA March, 2011,   . Claudication   . Hyperlipidemia   . GERD (gastroesophageal reflux disease)   . Diabetic neuropathy   . Pacemaker   . Stroke     ?  Marland Kitchen Arthritis   . Anemia     ?  . Diastolic CHF 02/21/7822    Past Surgical History  Procedure Laterality Date  . Carotid endarterectomy      RIGHT  . Cholecystectomy    . Tonsillectomy and adenoidectomy    . Knee surgery    . Coronary artery bypass graft  06/14/2011    Procedure: CORONARY ARTERY BYPASS GRAFTING (CABG);  Surgeon: Gaye Pollack, MD;  Location: [employer];  Service: Open Heart Surgery;  Laterality: N/A;  Coronary Artery Bypass Graft on pump times six;  utilizing internal mammary artery and right greater saphenous vein harvested endoscopically.  . Maze  06/14/2011    Procedure: MAZE;  Surgeon: Gaye Pollack, MD;  Location: [place];  Service: Open Heart Surgery;  Laterality: N/A;  Ligate left atrial appendage  . Circumcision    . Femoral-popliteal bypass graft Left 03/06/2012    Procedure: BYPASS GRAFT FEMORAL-POPLITEAL ARTERY;  Surgeon: Angelia Mould, MD;  Location: [facility] OR;  Service: Vascular;  Laterality: Left;  Left Femoral - Below Knee Popliteal Bypass Graft with Vein and Intraoperative Arteriogram.    Current Facility-Administered Medications  Medication Dose Route Frequency Provider Last Rate Last Dose  . 0.9 %  sodium chloride infusion   Intravenous Continuous Deboraha Sprang, MD      . ceFAZolin (ANCEF) IVPB 2 g/50 mL premix  2 g Intravenous On Call Deboraha Sprang, MD      . chlorhexidine (HIBICLENS) 4 % liquid 4 application  60 mL Topical Once Deboraha Sprang, MD      . gentamicin (GARAMYCIN) 80 mg in sodium chloride irrigation 0.9 % 500 mL irrigation  80 mg Irrigation On Call Deboraha Sprang, MD        Allergies  Allergen Reactions  . Lipitor [Atorvastatin Calcium] Other (See Comments)    Reaction unknown    Review of Systems negative except from HPI and PMH  Physical Exam     Well developed and well nourished in no acute distress HENT normal E scleral and icterus clear Neck Supple JVP 8-10 carotids brisk and full Clear  to ausculation  Regular rate and rhythm, no murmurs gallops or rub Soft with active bowel sounds No clubbing cyanosis 2+ Edema Alert and oriented, grossly normal motor and sensory function Skin Warm and Dry    Assessment and  Plan  Junctional rhythm Prev Pacer Priior CaBG and MAZE Normal LV function  Plan is to undertake EP study to evaluate electrical function of the atrium  If none is found, nothing else will be done, if identified, atrial lead revision will be carried out preceded by vneograpphy  Reviewed risk a nd benefits with patient and his wife

## 2012-11-05 NOTE — CV Procedure (Signed)
KIMOTHY KISHIMOTO [id number]  [id number]  Preop Dx:s/p maze; junctional rhythm previously placed atrial lead without capture or sensing Postop Dx same/ compatrtmented atrial activity with identified atrial signal near RAA where teh atrium could be captured and ventricle driven  Procedure: EPS and venogram RA lead revision  Cx: None 3  Dictation number [id number]  Virl Axe, MD 11/05/2012 7:13 PM

## 2012-11-06 ENCOUNTER — Ambulatory Visit (HOSPITAL_COMMUNITY): Payer: Federal, State, Local not specified - PPO

## 2012-11-06 DIAGNOSIS — Z950 Presence of cardiac pacemaker: Secondary | ICD-10-CM

## 2012-11-06 LAB — GLUCOSE, CAPILLARY

## 2012-11-06 MED ORDER — YOU HAVE A PACEMAKER BOOK
Freq: Once | Status: AC
  Administered 2012-11-06: 05:00:00
  Filled 2012-11-06: qty 1

## 2012-11-06 MED ORDER — PANTOPRAZOLE SODIUM 40 MG PO TBEC
40.0000 mg | DELAYED_RELEASE_TABLET | Freq: Every day | ORAL | Status: DC
  Administered 2012-11-06: 09:00:00 40 mg via ORAL
  Filled 2012-11-06: qty 1

## 2012-11-06 MED ORDER — METFORMIN HCL 1000 MG PO TABS: ORAL_TABLET | ORAL | Status: DC

## 2012-11-06 MED ORDER — LIVING WELL WITH DIABETES BOOK
Freq: Once | Status: AC
  Administered 2012-11-06: 05:00:00
  Filled 2012-11-06: qty 1

## 2012-11-06 NOTE — Discharge Instructions (Signed)
° °  Supplemental Discharge Instructions for  Pacemaker/Defibrillator Patients  Activity No heavy lifting or vigorous activity with your left/right arm for 6 to 8 weeks.  Do not raise your left/right arm above your head for one week.  Gradually raise your affected arm as drawn below.           10/25                      10/26                       10/27                      10/28       NO DRIVING for 1 week; you may begin driving on 90/93/1121. WOUND CARE   Keep the wound area clean and dry.  You may shower but no soaking in tub, swimming pool or hot tub for 10-14 days until incision is completely healed.    The Dermabond (glue) on your wound will fall off on its own; do not pull it off.  No bandage is needed on the site.  DO  NOT apply any creams, oils, or ointments to the wound area.   If you notice any drainage or discharge from the wound, any swelling or bruising at the site, or you develop a fever > 101? F after you are discharged home, call the office at once.  Special Instructions   You are still able to use cellular telephones; use the ear opposite the side where you have your pacemaker/defibrillator.  Avoid carrying your cellular phone near your device.   When traveling through airports, show security personnel your identification card to avoid being screened in the metal detectors.  Ask the security personnel to use the hand wand.   Avoid arc welding equipment, MRI testing (magnetic resonance imaging), TENS units (transcutaneous nerve stimulators).  Call the office for questions about other devices.   Avoid electrical appliances that are in poor condition or are not properly grounded.   Microwave ovens are safe to be near or to operate.

## 2012-11-06 NOTE — Op Note (Signed)
NAMECLEMENT, Edward [name] NO.:  [id number]  MEDICAL RECORD NO.:  [id number]  LOCATION:  6C08C                        FACILITY:  [facility]  PHYSICIAN:  Deboraha Sprang, MD, FACCDATE OF BIRTH:  [birth year]  DATE OF PROCEDURE:  11/05/2012 DATE OF DISCHARGE:                              OPERATIVE REPORT   PREOPERATIVE DIAGNOSES:  Atrial lead failure, __________ junctional rhythm.  POSTOPERATIVE DIAGNOSES:  Compartmentalized atrial activity.  PROCEDURE:  Invasive electrophysiological study with atrial pacing and recording.  DESCRIPTION OF PROCEDURE:  Following obtaining informed consent, the patient was brought to the electrophysiology laboratory and placed on the fluoroscopic table in supine position.  After routine prep and drape, cardiac catheterization was performed with local anesthesia and conscious sedation.  A 6-French Octapolar catheter was inserted via the right femoral vein to mapping sites in the right atrium.  What was noted is that there was dissociation of various aspects of the atrium with at least 3 different activation sequences.  On the region of the right atrial appendage, we were, however, able to identify any atrial signal that was one-to-one and we were able to drive the ventricle with a different QRS morphology from this location.  With this information in hand, it was elected to proceed with atrial lead revision.  The catheter was removed.  The sheath was removed, and the patient tolerated the procedure without apparent complication.  We then proceeded to atrial lead revision.  Following prep and drape, the left prepectoral region was anesthetized, and an incision was made and carried down to layer of the prepectoral fascia without violation of the pacemaker pocket.  Contrast venogram had demonstrated patency of the extrathoracic left subclavian vein, and we then were able to get an access, and then passed a 7-French subsequently up sized to an  8-French sheath to allow for passage of an atrial lead.  We then spent more than 2 hours mapping various sites in the atrium.  We used 45 minutes of fluoroscopy time (44.3) and mapped numerous sites, many of which were compartmentalized.  We were finally able to identify a location near the right atrial appendage where we were able to capture the atrium with a threshold of less than 2 V.  There was an atrial electrogram at this site but insufficient to measure sensitivity.  It was elected to deploy the lead in this location.  The previously implanted pacemaker was then opened from its pocket.  The atrial lead was capped and placed behind the device.  The new atrial lead was inserted into the pocket.  The pocket was copiously irrigated with antibiotic containing saline solution.  Surgicel was placed in the pocket.  The pocket was irrigated and then closed in 3 layers in the normal fashion.  A Dermabond __________ dressing was applied.  The patient tolerated the procedure with mild discomfort and was hypertensive and received labetalol at various stages during the procedure.  Otherwise, he tolerated it without difficulty.     Deboraha Sprang, MD, [facility]     SCK/MEDQ  D:  11/05/2012  T:  11/06/2012  Job:  [id number]

## 2012-11-06 NOTE — Progress Notes (Signed)
Patient ID: Edward [name], male   DOB: Mar 01, 1940, 72 y.o.   MRN: [id number] Subjective:  S/p atrial lead revision doing well.  Objective:  Vital Signs in the last 24 hours: Temp:  [95.9 F (35.5 C)-97.9 F (36.6 C)] 97.6 F (36.4 C) (10/23 0600) Pulse Rate:  [70-80] 75 (10/23 0600) Resp:  [16-20] 16 (10/23 0600) BP: (170-183)/(80-94) 170/80 mmHg (10/23 0600) SpO2:  [96 %-100 %] 98 % (10/23 0600) Weight:  [243 lb (110.224 kg)-249 lb 1.9 oz (113 kg)] 249 lb 1.9 oz (113 kg) (10/23 0600)  Intake/Output from previous day: 10/22 0701 - 10/23 0700 In: 770 [P.O.:720; I.V.:50] Out: 4150 [Urine:4150] Intake/Output from this shift:    Physical Exam: Well appearing NAD HEENT: Unremarkable Neck:  No JVD, no thyromegally Lymphatics:  No adenopathy Back:  No CVA tenderness Lungs:  Clear with no whezes, rales, or rhonchi. No hematoma HEART:  Regular rate rhythm, no murmurs, no rubs, no clicks Abd:  Flat, positive bowel sounds, no organomegally, no rebound, no guarding Ext:  2 plus pulses, no edema, no cyanosis, no clubbing Skin:  No rashes no nodules Neuro:  CN II through XII intact, motor grossly intact  Lab Results: No results found for this basename: WBC, HGB, PLT,  in the last 72 hours No results found for this basename: NA, K, CL, CO2, GLUCOSE, BUN, CREATININE,  in the last 72 hours No results found for this basename: TROPONINI, CK, MB,  in the last 72 hours Hepatic Function Panel No results found for this basename: PROT, ALBUMIN, AST, ALT, ALKPHOS, BILITOT, BILIDIR, IBILI,  in the last 72 hours No results found for this basename: CHOL,  in the last 72 hours No results found for this basename: PROTIME,  in the last 72 hours  Imaging: No results found.  CXR - reviewed Cardiac Studies: Tele - AV pacing Assessment/Plan:  1. Atrial lead malfunction 2. S/p atrial lead revision Rec:ok for discharge. Continue current meds and usual followup.  LOS: 1 day    Carleene Overlie  Taylor,M.D 11/06/2012, 8:01 AM

## 2012-11-06 NOTE — Discharge Summary (Addendum)
ELECTROPHYSIOLOGY DISCHARGE SUMMARY    Patient ID: Edward [name],  MRN: [id number], DOB/AGE: [birth year] 72 y.o.  Admit date: 11/05/2012 Discharge date: 11/06/2012  Primary Care Physician: Edward Miner, MD Primary Cardiologist / Primary EP: Edward Fredrickson, MD / Edward Comes, MD  Primary Discharge Diagnosis:  1. 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 Sinus node dysfunction s/p PPM implant 2. Atrial fibrillation s/p MAZE procedure; not on anticoagulation due to GI bleeding (per Edward [name] note May 2014) 3. CAD s/p CABG 4. PAD 5. HTN 6. Dyslipidemia 7. DM 8. GERD  Procedures This Admission:  1. Invasive EP study with atrial pacing / recording, venogram and atrial lead revision Findings: compartmentalized atrial activity with identified atrial signal near RAA where the atrium could be captured and ventricle driven Conclusion: successful atrial lead revision ([employer])   History and Hospital Course:  Mr. Edward [name] is a pleasant 72 year old man with sinus node dysfunction s/p PPM implant who was recently found to have atrial lead failure. He presented yesterday and underwent invasive EP study with atrial pacing / recording and atrial lead revision. Mr. Edward [name] tolerated this procedure well without any immediate complication. He remains hemodynamically stable and afebrile. His chest xray shows stable lead placement without pneumothorax. His device interrogation shows normal PPM function with stable lead parameters/measurements. His implant site is intact without significant bleeding or hematoma. He has been given discharge instructions including wound care and activity restrictions. He will follow-up in 10 days for wound check. There were no changes made to his medications. He has been seen, examined and deemed stable for discharge today by Dr. Cristopher [name].  Discharge Vitals: Blood pressure 186/93, pulse 80,  temperature 98.1 F (36.7 C), temperature source Oral, resp. rate 16, height 5' 9"  (1.753 m), weight 249 lb 1.9 oz (113 kg), SpO2 98.00%.   Labs: Lab Results  Component Value Date   WBC 10.1 10/31/2012   HGB 11.2* 10/31/2012   HCT 34.4* 10/31/2012   MCV 66.8 Repeated and verified X2.* 10/31/2012   PLT 187.0 10/31/2012    Recent Labs Lab 10/31/12 1154  NA 138  K 4.2  CL 103  CO2 25  BUN 15  CREATININE 1.4  CALCIUM 8.8  GLUCOSE 129*    Disposition:  The patient is being discharged in stable condition.  Follow-up: Follow-up Information   Follow up with [facility] On 11/13/2012. (At 4:30 PM for wound check)    Specialty:  Cardiology   Contact information:   [address] [place] [address] [phone]      Follow up with [name]., MD On 12/03/2012. (At 8:00 AM)    Specialty:  Cardiology   Contact information:   [address] [place] [address] [phone]       Follow up with Virl Axe, MD In 3 months. (Our office will mail letter)    Specialty:  Cardiology   Contact information:   [address] [place] [address] [phone]      Discharge Medications:    Medication List         aspirin EC 81 MG tablet  Take 81 mg by mouth daily.     furosemide 40 MG tablet  Commonly known as:  LASIX  Take 2 tablets (80 mg total) by mouth daily.     loratadine 10 MG tablet  Commonly known as:  CLARITIN  Take 10 mg by mouth daily.     losartan 100 MG tablet  Commonly known as:  COZAAR  Take 1 pill (100 mg) once daily.     metFORMIN 1000 MG tablet  Commonly known as:  GLUCOPHAGE  Take 1 1/2 tablets (1,556m) every morning and 1 tablet (1,0060m every evening. HOLD for 2 days then resume on 11/08/2012     metoprolol 50 MG tablet  Commonly known as:  LOPRESSOR  Take 50 mg by mouth 2 (two) times daily.     multivitamin with minerals Tabs tablet  Take 1 tablet by mouth  daily.     omeprazole 20 MG tablet  Commonly known as:  PRILOSEC OTC  Take 20 mg by mouth every morning.     oxymetazoline 0.05 % nasal spray  Commonly known as:  AFRIN  Place 2 sprays into the nose daily as needed for congestion.     potassium chloride SA 20 MEQ tablet  Commonly known as:  K-DUR,KLOR-CON  Take 1 tablet (20 mEq total) by mouth daily.     pravastatin 80 MG tablet  Commonly known as:  PRAVACHOL  TAKE 1 TABLET (80 MG TOTAL) BY MOUTH DAILY.       Duration of Discharge Encounter: Greater than 30 minutes including physician time.  Signed, EDIleene HutchinsonPA-C 11/06/2012, 8:58 AM  EP Attending  Patient seen and examined. Agree with above.  GrMikle Bosworth.

## 2012-11-09 ENCOUNTER — Ambulatory Visit (INDEPENDENT_AMBULATORY_CARE_PROVIDER_SITE_OTHER): Payer: Federal, State, Local not specified - PPO

## 2012-11-09 DIAGNOSIS — Z23 Encounter for immunization: Secondary | ICD-10-CM

## 2012-11-13 ENCOUNTER — Ambulatory Visit (INDEPENDENT_AMBULATORY_CARE_PROVIDER_SITE_OTHER): Payer: Federal, State, Local not specified - PPO | Admitting: *Deleted

## 2012-11-13 DIAGNOSIS — I495 Sick sinus syndrome: Secondary | ICD-10-CM

## 2012-11-13 LAB — PACEMAKER DEVICE OBSERVATION
AL AMPLITUDE: 0.35 mv
AL IMPEDENCE PM: 473 Ohm
AL THRESHOLD: 0.75 V
ATRIAL PACING PM: 57
RV LEAD AMPLITUDE: 11.2 mv
RV LEAD IMPEDENCE PM: 661 Ohm
RV LEAD THRESHOLD: 0.75 V
VENTRICULAR PACING PM: 99

## 2012-11-13 NOTE — Progress Notes (Signed)
Wound check appointment. Steri-strips removed. Wound without redness or edema. Incision edges approximated, wound well healed. Normal device function. Thresholds, sensing, and impedances consistent with implant measurements. Device programmed at 3.5V/auto capture programmed on for extra safety margin until 3 month visit. Histogram distribution appropriate for patient and level of activity.74moe switches all <133mute.  No high ventricular rates noted. Patient educated about wound care, arm mobility, lifting restrictions. ROV in 3 months with implanting physician.

## 2012-11-21 ENCOUNTER — Encounter: Payer: Self-pay | Admitting: Internal Medicine

## 2012-11-21 ENCOUNTER — Ambulatory Visit (INDEPENDENT_AMBULATORY_CARE_PROVIDER_SITE_OTHER): Payer: Federal, State, Local not specified - PPO | Admitting: Internal Medicine

## 2012-11-21 VITALS — BP 142/84 | HR 81 | Temp 97.7°F | Ht 69.5 in | Wt 243.0 lb

## 2012-11-21 DIAGNOSIS — R0609 Other forms of dyspnea: Secondary | ICD-10-CM

## 2012-11-21 DIAGNOSIS — D509 Iron deficiency anemia, unspecified: Secondary | ICD-10-CM

## 2012-11-21 DIAGNOSIS — R0989 Other specified symptoms and signs involving the circulatory and respiratory systems: Secondary | ICD-10-CM

## 2012-11-21 DIAGNOSIS — R0600 Dyspnea, unspecified: Secondary | ICD-10-CM

## 2012-11-21 NOTE — Patient Instructions (Addendum)
Prilosec 20 mg take  X  2 x 30 min before breakfast and Pepcid 20 mg at bedtime consistently until return  GERD (REFLUX)  is an extremely common cause of respiratory symptoms, many times with no significant heartburn at all.    It can be treated with medication, but also with lifestyle changes including avoidance of late meals, excessive alcohol, smoking cessation, and avoid fatty foods, chocolate, peppermint, colas, red wine, and acidic juices such as orange juice.  NO MINT OR MENTHOL PRODUCTS SO NO COUGH DROPS  USE SUGARLESS CANDY INSTEAD (jolley ranchers or Stover's)  NO OIL BASED VITAMINS - use powdered substitutes.  Weight control is simply a matter of calorie balance which needs to be tilted in your favor by eating less and exercising more.  To get the most out of exercise, you need to be continuously aware that you are short of breath, but never out of breath, for 30 minutes daily. As you improve, it will actually be easier for you to do the same amount of exercise  in  30 minutes so always push to the level where you are short of breath.  If this does not result in gradual weight reduction then I strongly recommend you see a nutritionist with a food diary x 2 weeks so that we can work out a negative calorie balance which is universally effective in steady weight loss programs.  Think of your calorie balance like you do your bank account where in this case you want the balance to go down so you must take in less calories than you burn up.  It's just that simple:  Hard to do, but easy to understand.  Good luck!     Please schedule a follow up office visit in 4 weeks, sooner if needed with pfts - you can cancel if you are 100% Late add: recheck cbc, fe profile next ov

## 2012-11-21 NOTE — Progress Notes (Signed)
Subjective:     Patient ID: Edward [name], male   DOB: 1940-02-28 MRN: [id number]  HPI  13 yowm quit smoking 1981 @ wt around 210 with apparent  Onset of breathing difficult late spring  2013  "thought it was his heart"  but had cabg 06/14/11 at wt around 225  and no improvement to date in doe with no def cardiac source identified by Dr Caryl Comes  so self referred to pulmonary clinic 11/21/2012 .  11/21/2012 1st [facility] office visit/ [place] Pullin cc sob x exertion x steps x one flight at home does it s stopping but short of breath at top.  Does ok at [employer], does downhill to mailbox fine but uphill to house from  [place] has to go slow or has to stop, seems to be getting worse gradually over time. Never occurs at rest or supine/ sleeping.   No obvious day to day or daytime variabilty or assoc chronic cough or cp or chest tightness, subjective wheeze overt sinus or hb symptoms. No unusual exp hx or h/o childhood pna/ asthma or knowledge of premature birth.  Sleeping ok without nocturnal  or early am exacerbation  of respiratory  c/o's or need for noct saba. Also denies any obvious fluctuation of symptoms with weather or environmental changes or other aggravating or alleviating factors except as outlined above   Current Medications, Allergies, Complete Past Medical History, Past Surgical History, Family History, and Social History were reviewed in [employer] record.  ROS  The following are not active complaints unless bolded sore throat, dysphagia, dental problems, itching, sneezing,  nasal congestion or excess/ purulent secretions, ear ache,   fever, chills, sweats, unintended wt loss, pleuritic or exertional cp, hemoptysis,  orthopnea pnd or leg swelling, presyncope, palpitations, heartburn, abdominal pain, anorexia, nausea, vomiting, diarrhea  or change in bowel or urinary habits, change in stools or urine, dysuria,hematuria,  rash, arthralgias, visual complaints,  headache, numbness weakness or ataxia or problems with walking or coordination,  change in mood/affect or memory.          Review of Systems     Objective:   Physical Exam Hoarse amb wm who failed to answer a single question asked in a straightforward manner, tending to go off on tangents or answer questions with ambiguous medical terms or diagnoses and seemed aggravated  when asked the same question more than once for clarification, very confused with details of care eg  "when did you first notice your breathing problem"  - every interviewer including Dr Caryl Comes got a different answer ranging from 2 weeks ago to 30 years ago.   Wt Readings from Last 3 Encounters:  11/21/12 243 lb (110.224 kg)  11/06/12 249 lb 1.9 oz (113 kg)  11/06/12 249 lb 1.9 oz (113 kg)     HEENT: nl dentition, turbinates, and orophanx. Nl external ear canals without cough reflex   NECK :  without JVD/Nodes/TM/ nl carotid upstrokes bilaterally   LUNGS: no acc muscle use, pseudowheeze only    CV:  RRR  no s3 or murmur or increase in P2, no edema   ABD:  Quite obese but soft and nontender with nl excursion in the supine position. No bruits or organomegaly, bowel sounds nl  MS:  warm without deformities, calf tenderness, cyanosis or clubbing  SKIN: warm and dry without lesions    NEURO:  alert,   no deficits    cxr10/23/14  No pneumothorax. Evaluation of the  retrosternal clear  space is obscured secondary to overlying osseous  and soft tissue structures. The previously questioned nodule  overlying the right hilum has resolved in the interval. There is  grossly unchanged mild slightly nodular thickening of the pulmonary  interstitium. No focal airspace opacities. No pleural effusion or  pneumothorax. No evidence of edema.      Assessment:

## 2012-11-22 DIAGNOSIS — D509 Iron deficiency anemia, unspecified: Secondary | ICD-10-CM | POA: Insufficient documentation

## 2012-11-22 NOTE — Assessment & Plan Note (Signed)
See CBC 10/31/12 with very low Iron Sats  Will need to contact pt to see what doctor is following this problem as could be contributing to ex intolerance

## 2012-11-22 NOTE — Assessment & Plan Note (Addendum)
-   spirometry 11/21/2012 > FEV1  2.29 (67%) ratio 74  - 11/21/2012  Walked RA x 3 laps @ 185 ft each stopped due to end of study, rapid pace, no desats   Symptoms are markedly disproportionate to objective findings and not clear this is a lung problem but pt does appear to have difficult airway management issues. DDX of  difficult airways managment all start with A and  include Adherence, Ace Inhibitors, Acid Reflux, Active Sinus Disease, Alpha 1 Antitripsin deficiency, Anxiety masquerading as Airways dz,  ABPA,  allergy(esp in young), Aspiration (esp in elderly), Adverse effects of DPI,  Active smokers, plus two Bs  = Bronchiectasis and Beta blocker use..and one C= CHF  ? Acid (or non-acid) GERD > always difficult to exclude as up to 75% of pts in some series report no assoc GI/ Heartburn symptoms> rec max (24h)  acid suppression and diet restrictions/ reviewed and instructions given in writting   ? Anxiety/ deconditioning and obesity the greatest concerns here > rec trial of gerd rx and regroup in 4 weeks after he tries paced ex or a flat surface

## 2012-11-24 ENCOUNTER — Encounter: Payer: Self-pay | Admitting: Internal Medicine

## 2012-12-01 ENCOUNTER — Ambulatory Visit (INDEPENDENT_AMBULATORY_CARE_PROVIDER_SITE_OTHER): Payer: Federal, State, Local not specified - PPO | Admitting: Internal Medicine

## 2012-12-01 ENCOUNTER — Encounter: Payer: Self-pay | Admitting: Internal Medicine

## 2012-12-01 ENCOUNTER — Other Ambulatory Visit: Payer: Self-pay | Admitting: Internal Medicine

## 2012-12-01 ENCOUNTER — Other Ambulatory Visit (INDEPENDENT_AMBULATORY_CARE_PROVIDER_SITE_OTHER): Payer: Federal, State, Local not specified - PPO

## 2012-12-01 ENCOUNTER — Ambulatory Visit: Payer: Federal, State, Local not specified - PPO | Admitting: Cardiovascular Disease

## 2012-12-01 VITALS — BP 140/82 | HR 83 | Temp 97.5°F | Resp 18 | Ht 69.5 in | Wt 249.0 lb

## 2012-12-01 DIAGNOSIS — D649 Anemia, unspecified: Secondary | ICD-10-CM

## 2012-12-01 DIAGNOSIS — R0602 Shortness of breath: Secondary | ICD-10-CM

## 2012-12-01 DIAGNOSIS — R0609 Other forms of dyspnea: Secondary | ICD-10-CM

## 2012-12-01 DIAGNOSIS — E119 Type 2 diabetes mellitus without complications: Secondary | ICD-10-CM

## 2012-12-01 DIAGNOSIS — E785 Hyperlipidemia, unspecified: Secondary | ICD-10-CM

## 2012-12-01 DIAGNOSIS — R609 Edema, unspecified: Secondary | ICD-10-CM

## 2012-12-01 LAB — POCT GLYCOSYLATED HEMOGLOBIN (HGB A1C): Hemoglobin A1C: 6.9

## 2012-12-01 LAB — BASIC METABOLIC PANEL
BUN: 21 mg/dL (ref 6–23)
CO2: 22 mEq/L (ref 19–32)
Creatinine, Ser: 1.4 mg/dL (ref 0.4–1.5)
GFR: 53.62 mL/min — ABNORMAL LOW (ref >60.00)
Glucose, Bld: 183 mg/dL — ABNORMAL HIGH (ref 70–99)
Potassium: 4.5 mEq/L (ref 3.5–5.1)
Sodium: 137 mEq/L (ref 135–145)

## 2012-12-01 LAB — POCT CBC
Granulocyte percent: 77.3 %G (ref 37–80)
Hemoglobin: 9.9 g/dL — AB (ref 14.1–18.1)
Lymph, poc: 1.7 (ref 0.6–3.4)
MCH, POC: 20.6 pg — AB (ref 27–31.2)
MCHC: 29.6 g/dL — AB (ref 31.8–35.4)
MPV: 10 fL (ref 0–99.8)
POC Granulocyte: 8.3 — AB (ref 2–6.9)
POC LYMPH PERCENT: 15.9 %L (ref 10–50)
POC MID %: 6.8 %M (ref 0–12)
RDW, POC: 19.3 %
WBC: 10.8 10*3/uL — AB (ref 4.6–10.2)

## 2012-12-01 LAB — POCT URINALYSIS DIPSTICK
Bilirubin, UA: NEGATIVE
Glucose, UA: NEGATIVE
Ketones, UA: NEGATIVE
Spec Grav, UA: 1.015
Urobilinogen, UA: 0.2
pH, UA: 5.5

## 2012-12-01 LAB — COMPREHENSIVE METABOLIC PANEL
ALT: 28 U/L (ref 0–53)
AST: 31 U/L (ref 0–37)
Alkaline Phosphatase: 58 U/L (ref 39–117)
BUN: 21 mg/dL (ref 6–23)
Calcium: 8.7 mg/dL (ref 8.4–10.5)
Potassium: 4.5 mEq/L (ref 3.5–5.1)
Sodium: 137 mEq/L (ref 135–145)
Total Bilirubin: 1 mg/dL (ref 0.3–1.2)
Total Protein: 7 g/dL (ref 6.0–8.3)

## 2012-12-01 LAB — IFOBT (OCCULT BLOOD): IFOBT: POSITIVE

## 2012-12-01 LAB — IRON AND TIBC
%SAT: 40 % (ref 20–55)
Iron: 184 ug/dL — ABNORMAL HIGH (ref 42–165)

## 2012-12-01 LAB — LIPID PANEL
Cholesterol: 130 mg/dL (ref 0–200)
HDL: 35.7 mg/dL — ABNORMAL LOW (ref >39.00)
Total CHOL/HDL Ratio: 4
VLDL: 32 mg/dL (ref 0.0–40.0)

## 2012-12-01 NOTE — Progress Notes (Signed)
Subjective:    Patient ID: Edward [name], male    DOB: 15-Apr-1940, 72 y.o.   MRN: [id number]  HPI Requests a1c check. And anemia chck. Is sob and has DOE all the time. Followed closely for this with pulm. And cardiology. Does have anle edema. Full pulmonary evaluation reviewed. He is mildly microcytic anemic  Review of Systems Complex many issues, working with pulmonary and cardiology    Objective:   Physical Exam  Constitutional: He is oriented to person, place, and time. He appears well-developed and well-nourished.  Cardiovascular: Normal rate, regular rhythm and normal heart sounds.   Pulmonary/Chest: No accessory muscle usage. Tachypnea noted. He has no decreased breath sounds. He has no wheezes. He has no rhonchi. He has no rales.  Genitourinary: Rectum normal and prostate normal.  Musculoskeletal: He exhibits edema.  Neurological: He is alert and oriented to person, place, and time. He exhibits normal muscle tone. Coordination normal.  Psychiatric: His speech is normal. Judgment and thought content normal. His mood appears anxious. He is agitated. Cognition and memory are normal.  2+ ankle edema    Results for orders placed in visit on 12/01/12  GLUCOSE, POCT (MANUAL RESULT ENTRY)      Result Value Range   POC Glucose 193 (*) 70 - 99 mg/dl  POCT GLYCOSYLATED HEMOGLOBIN (HGB A1C)      Result Value Range   Hemoglobin A1C 6.9    POCT URINALYSIS DIPSTICK      Result Value Range   Color, UA yellow     Clarity, UA clear     Glucose, UA neg     Bilirubin, UA neg     Ketones, UA neg     Spec Grav, UA 1.015     Blood, UA trace     pH, UA 5.5     Protein, UA 100     Urobilinogen, UA 0.2     Nitrite, UA neg     Leukocytes, UA Negative    POCT CBC      Result Value Range   WBC 10.8 (*) 4.6 - 10.2 K/uL   Lymph, poc 1.7  0.6 - 3.4   POC LYMPH PERCENT 15.9  10 - 50 %L   MID (cbc) 0.7  0 - 0.9   POC MID % 6.8  0 - 12 %M   POC Granulocyte 8.3 (*) 2 - 6.9   Granulocyte  percent 77.3  37 - 80 %G   RBC 4.81  4.69 - 6.13 M/uL   Hemoglobin 9.9 (*) 14.1 - 18.1 g/dL   HCT, POC 33.5 (*) 43.5 - 53.7 %   MCV 69.7 (*) 80 - 97 fL   MCH, POC 20.6 (*) 27 - 31.2 pg   MCHC 29.6 (*) 31.8 - 35.4 g/dL   RDW, POC 19.3     Platelet Count, POC 243  142 - 424 K/uL   MPV 10.0  0 - 99.8 fL   Results for orders placed in visit on 12/01/12  GLUCOSE, POCT (MANUAL RESULT ENTRY)      Result Value Range   POC Glucose 193 (*) 70 - 99 mg/dl  POCT GLYCOSYLATED HEMOGLOBIN (HGB A1C)      Result Value Range   Hemoglobin A1C 6.9    POCT URINALYSIS DIPSTICK      Result Value Range   Color, UA yellow     Clarity, UA clear     Glucose, UA neg     Bilirubin, UA neg  Ketones, UA neg     Spec Grav, UA 1.015     Blood, UA trace     pH, UA 5.5     Protein, UA 100     Urobilinogen, UA 0.2     Nitrite, UA neg     Leukocytes, UA Negative    POCT CBC      Result Value Range   WBC 10.8 (*) 4.6 - 10.2 K/uL   Lymph, poc 1.7  0.6 - 3.4   POC LYMPH PERCENT 15.9  10 - 50 %L   MID (cbc) 0.7  0 - 0.9   POC MID % 6.8  0 - 12 %M   POC Granulocyte 8.3 (*) 2 - 6.9   Granulocyte percent 77.3  37 - 80 %G   RBC 4.81  4.69 - 6.13 M/uL   Hemoglobin 9.9 (*) 14.1 - 18.1 g/dL   HCT, POC 33.5 (*) 43.5 - 53.7 %   MCV 69.7 (*) 80 - 97 fL   MCH, POC 20.6 (*) 27 - 31.2 pg   MCHC 29.6 (*) 31.8 - 35.4 g/dL   RDW, POC 19.3     Platelet Count, POC 243  142 - 424 K/uL   MPV 10.0  0 - 99.8 fL  IFOBT (OCCULT BLOOD)      Result Value Range   IFOBT Positive     Last bnp normal    Assessment & Plan:  Take extra lasix every other day for 1 week/See cardio for medication adjustment Anemia/Hemosure DOE and SOB possible mild chf plus anemia and an element of gerd See cardio/GI/ and hematology

## 2012-12-01 NOTE — Patient Instructions (Addendum)
Shortness of Breath Shortness of breath means you have trouble breathing. Shortness of breath may indicate that you have a medical problem. You should seek immediate medical care for shortness of breath. CAUSES   Not enough oxygen in the air (as with high altitudes or a smoke-filled room).  Short-term (acute) lung disease, including:  Infections, such as pneumonia.  Fluid in the lungs, such as heart failure.  A blood clot in the lungs (pulmonary embolism).  Long-term (chronic) lung diseases.  Heart disease (heart attack, angina, heart failure, and others).  Low red blood cells (anemia).  Poor physical fitness. This can cause shortness of breath when you exercise.  Chest or back injuries or stiffness.  Being overweight.  Smoking.  Anxiety. This can make you feel like you are not getting enough air. DIAGNOSIS  Serious medical problems can usually be found during your physical exam. Tests may also be done to determine why you are having shortness of breath. Tests may include:  Chest X-rays.  Lung function tests.  Blood tests.  Electrocardiography.  Exercise testing.  Echocardiography.  Imaging scans. Your caregiver may not be able to find a cause for your shortness of breath after your exam. In this case, it is important to have a follow-up exam with your caregiver as directed.  TREATMENT  Treatment for shortness of breath depends on the cause of your symptoms and can vary greatly. HOME CARE INSTRUCTIONS   Do not smoke. Smoking is a common cause of shortness of breath. If you smoke, ask for help to quit.  Avoid being around chemicals or things that may bother your breathing, such as paint fumes and dust.  Rest as needed. Slowly resume your usual activities.  If medicines were prescribed, take them as directed for the full length of time directed. This includes oxygen and any inhaled medicines.  Keep all follow-up appointments as directed by your caregiver. SEEK  MEDICAL CARE IF:   Your condition does not improve in the time expected.  You have a hard time doing your normal activities even with rest.  You have any side effects or problems with the medicines prescribed.  You develop any new symptoms. SEEK IMMEDIATE MEDICAL CARE IF:   Your shortness of breath gets worse.  You feel lightheaded, faint, or develop a cough not controlled with medicines.  You start coughing up blood.  You have pain with breathing.  You have chest pain or pain in your arms, shoulders, or abdomen.  You have a fever.  You are unable to walk up stairs or exercise the way you normally do. MAKE SURE YOU:  Understand these instructions.  Will watch your condition.  Will get help right away if you are not doing well or get worse. Document Released: 09/26/2000 Document Revised: 07/03/2011 Document Reviewed: 03/19/2011 [facility] Patient Information 2014 [place]. Edema Edema is an abnormal build-up of fluids in tissues. Because this is partly dependent on gravity (water flows to the lowest place), it is more common in the legs and thighs (lower extremities). It is also common in the looser tissues, like around the eyes. Painless swelling of the feet and ankles is common and increases as a person ages. It may affect both legs and may include the calves or even thighs. When squeezed, the fluid may move out of the affected area and may leave a dent for a few moments. CAUSES   Prolonged standing or sitting in one place for extended periods of time. Movement helps pump tissue fluid  into the veins, and absence of movement prevents this, resulting in edema.  Varicose veins. The valves in the veins do not work as well as they should. This causes fluid to leak into the tissues.  Fluid and salt overload.  Injury, burn, or surgery to the leg, ankle, or foot, may damage veins and allow fluid to leak out.  Sunburn damages vessels. Leaky vessels allow fluid to go out into  the sunburned tissues.  Allergies (from insect bites or stings, medications or chemicals) cause swelling by allowing vessels to become leaky.  Protein in the blood helps keep fluid in your vessels. Low protein, as in malnutrition, allows fluid to leak out.  Hormonal changes, including pregnancy and menstruation, cause fluid retention. This fluid may leak out of vessels and cause edema.  Medications that cause fluid retention. Examples are sex hormones, blood pressure medications, steroid treatment, or anti-depressants.  Some illnesses cause edema, especially heart failure, kidney disease, or liver disease.  Surgery that cuts veins or lymph nodes, such as surgery done for the heart or for breast cancer, may result in edema. DIAGNOSIS  Your caregiver is usually easily able to determine what is causing your swelling (edema) by simply asking what is wrong (getting a history) and examining you (doing a physical). Sometimes x-rays, EKG (electrocardiogram or heart tracing), and blood work may be done to evaluate for underlying medical illness. TREATMENT  General treatment includes:  Leg elevation (or elevation of the affected body part).  Restriction of fluid intake.  Prevention of fluid overload.  Compression of the affected body part. Compression with elastic bandages or support stockings squeezes the tissues, preventing fluid from entering and forcing it back into the blood vessels.  Diuretics (also called water pills or fluid pills) pull fluid out of your body in the form of increased urination. These are effective in reducing the swelling, but can have side effects and must be used only under your caregiver's supervision. Diuretics are appropriate only for some types of edema. The specific treatment can be directed at any underlying causes discovered. Heart, liver, or kidney disease should be treated appropriately. HOME CARE INSTRUCTIONS   Elevate the legs (or affected body part) above  the level of the heart, while lying down.  Avoid sitting or standing still for prolonged periods of time.  Avoid putting anything directly under the knees when lying down, and do not wear constricting clothing or garters on the upper legs.  Exercising the legs causes the fluid to work back into the veins and lymphatic channels. This may help the swelling go down.  The pressure applied by elastic bandages or support stockings can help reduce ankle swelling.  A low-salt diet may help reduce fluid retention and decrease the ankle swelling.  Take any medications exactly as prescribed. SEEK MEDICAL CARE IF:  Your edema is not responding to recommended treatments. SEEK IMMEDIATE MEDICAL CARE IF:   You develop shortness of breath or chest pain.  You cannot breathe when you lay down; or if, while lying down, you have to get up and go to the window to get your breath.  You are having increasing swelling without relief from treatment.  You develop a fever over 102 F (38.9 C).  You develop pain or redness in the areas that are swollen.  Tell your caregiver right away if you have gained 03 lb/1.4 kg in 1 day or 05 lb/2.3 kg in a week. MAKE SURE YOU:   Understand these instructions.  Will watch  your condition.  Will get help right away if you are not doing well or get worse. Document Released: 01/01/2005 Document Revised: 07/03/2011 Document Reviewed: 08/20/2007 [facility] Patient Information 2014 [place]. Anemia, Nonspecific Anemia is a condition in which the concentration of red blood cells or hemoglobin in the blood is below normal. Hemoglobin is a substance in red blood cells that carries oxygen to the tissues of the body. Anemia results in not enough oxygen reaching these tissues.  CAUSES  Common causes of anemia include:   Excessive bleeding. Bleeding may be internal or external. This includes excessive bleeding from periods (in women) or from the intestine.   Poor  nutrition.   Chronic kidney, thyroid, and liver disease.  Bone marrow disorders that decrease red blood cell production.  Cancer and treatments for cancer.  HIV, AIDS, and their treatments.  Spleen problems that increase red blood cell destruction.  Blood disorders.  Excess destruction of red blood cells due to infection, medicines, and autoimmune disorders. SIGNS AND SYMPTOMS   Minor weakness.   Dizziness.   Headache.  Palpitations.   Shortness of breath, especially with exercise.   Paleness.  Cold sensitivity.  Indigestion.  Nausea.  Difficulty sleeping.  Difficulty concentrating. Symptoms may occur suddenly or they may develop slowly.  DIAGNOSIS  Additional blood tests are often needed. These help your health care provider determine the best treatment. Your health care provider will check your stool for blood and look for other causes of blood loss.  TREATMENT  Treatment varies depending on the cause of the anemia. Treatment can include:   Supplements of iron, vitamin K48, or folic acid.   Hormone medicines.   A blood transfusion. This may be needed if blood loss is severe.   Hospitalization. This may be needed if there is significant continual blood loss.   Dietary changes.  Spleen removal. HOME CARE INSTRUCTIONS Keep all follow-up appointments. It often takes many weeks to correct anemia, and having your health care provider check on your condition and your response to treatment is very important. SEEK IMMEDIATE MEDICAL CARE IF:   You develop extreme weakness, shortness of breath, or chest pain.   You become dizzy or have trouble concentrating.  You develop heavy vaginal bleeding.   You develop a rash.   You have bloody or black, tarry stools.   You faint.   You vomit up blood.   You vomit repeatedly.   You have abdominal pain.  You have a fever or persistent symptoms for more than 2 3 days.   You have a fever and  your symptoms suddenly get worse.   You are dehydrated.  MAKE SURE YOU:  Understand these instructions.  Will watch your condition.  Will get help right away if you are not doing well or get worse. Document Released: 02/09/2004 Document Revised: 09/03/2012 Document Reviewed: 06/27/2012 [facility] Patient Information 2014 [place].

## 2012-12-03 ENCOUNTER — Encounter: Payer: Self-pay | Admitting: Cardiovascular Disease

## 2012-12-03 ENCOUNTER — Ambulatory Visit (INDEPENDENT_AMBULATORY_CARE_PROVIDER_SITE_OTHER): Payer: Federal, State, Local not specified - PPO | Admitting: Cardiovascular Disease

## 2012-12-03 VITALS — BP 171/98 | HR 81 | Ht 69.5 in | Wt 248.1 lb

## 2012-12-03 DIAGNOSIS — R0609 Other forms of dyspnea: Secondary | ICD-10-CM

## 2012-12-03 DIAGNOSIS — I2510 Atherosclerotic heart disease of native coronary artery without angina pectoris: Secondary | ICD-10-CM

## 2012-12-03 DIAGNOSIS — R0602 Shortness of breath: Secondary | ICD-10-CM

## 2012-12-03 DIAGNOSIS — R635 Abnormal weight gain: Secondary | ICD-10-CM

## 2012-12-03 DIAGNOSIS — I4891 Unspecified atrial fibrillation: Secondary | ICD-10-CM

## 2012-12-03 LAB — BRAIN NATRIURETIC PEPTIDE: Pro B Natriuretic peptide (BNP): 177 pg/mL — ABNORMAL HIGH (ref 0.0–100.0)

## 2012-12-03 LAB — HEPATIC FUNCTION PANEL
AST: 29 U/L (ref 0–37)
Albumin: 3.6 g/dL (ref 3.5–5.2)
Total Bilirubin: 1.4 mg/dL — ABNORMAL HIGH (ref 0.3–1.2)

## 2012-12-03 NOTE — Assessment & Plan Note (Signed)
S/p pacer,   A - pacing  S/p MAZE with no recent AF. 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

## 2012-12-03 NOTE — Assessment & Plan Note (Addendum)
He continues to be very short of breath.   He ambulated with me about 200 feet and became very dyspneac.  Surprisingly, his O2 sats increased with exertion.  His skin color is not very good - he looks yellow.  Will check liver enz. And BNP and TSH.   I suspect that his DOE  He has mild anemia.  He needs a GI referral.  I will see him in 3 months

## 2012-12-03 NOTE — Assessment & Plan Note (Signed)
No angina 

## 2012-12-03 NOTE — Progress Notes (Signed)
Edward [name] Date of Birth  Oct 20, 1940 [place]  [address]    [place]   [place], [place]  [address]    [place], [place]  [address] [phone]  Fax  [phone]  [phone]  Fax [phone]  Problem list:  1. Atrial fibrillation- - s/p MAZE procedure  - no AF recently 2. Hypertension 3. Diabetes mellitus 4. Dyslipidemia 5. Peripheral vascular disease:-Status post right carotid endarterectomy and status post PTA by Dr. Sherren Mocha 6. History of GI bleed-currently not on antiplatelet agents or anticoagulants-,  he was hospitalized July 30, 2011 for GI bleed.  Was on Xarelto, ASA, plavix.  Xarelto and plavx were held. 7. Anemia 8. History of TIA 9. 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 gentleman with a history as noted above. He had coronary artery bypass grafting in May of this year. He also had a Maze procedure.  April 23, 2012:  Edward [name] presents today after seeing Dr. wall in the office several weeks ago. He had vascular surgery on his left leg. Since that time he's had significant edema. There is no significant edema on the right leg. He's tried elevating his leg but has had minimal success. His Lasix was increased.  He has been able to diurese about 5 pounds since that time.    He's had some shortness of breath climbing stairs. He's not any chest pains. He did not sleep well last night. This may explain his elevated blood pressure.    Jun 04, 2012:  Edward [name] was seen  In April for leg edema.  He has some infection associated with his vascular surgery leg would.  He saw Dr Scot Dock and his wound was debrided.   Echo revealed LV function.    He has done better on the Lasix 80 mg a day.  We have tried Lasix 40 mg a day but his dyspnea is worse.  He had CABG in May 2013.    Nov. 19, 2014;  Edward [name] is very short of breath today.  He had a pacer revision ( revised  atrial lead) by Dr. Caryl Comes in Oct.  He has not noticed any improvement after the pacer revision.  He had GI bleeding and was seen by Dr. Birdena Jubilee.  He has not seen a GI doctor.     His breathing is much worse over the past several months.    He stopped his apirin this week.    Current Outpatient Prescriptions on File Prior to Visit  Medication Sig Dispense Refill  . acetaminophen (TYLENOL) 500 MG tablet Take 500 mg by mouth every 6 (six) hours as needed.      Marland Kitchen aspirin EC 81 MG tablet Take 81 mg by mouth daily.      . diphenhydramine-acetaminophen (TYLENOL PM) 25-500 MG TABS Take 1 tablet by mouth at bedtime as needed.      . furosemide (LASIX) 40 MG tablet Take 2 tablets (80 mg total) by mouth daily.  180 tablet  3  . Iron TABS Take by mouth 2 (two) times daily.       Marland Kitchen l-methylfolate-B6-B12 (METANX) 3-35-2 MG TABS Take 1 tablet by mouth daily.      Marland Kitchen loratadine (CLARITIN) 10 MG tablet Take 10 mg by mouth daily.      Marland Kitchen losartan (COZAAR) 100 MG tablet Take 1 pill (100 mg)  once daily.  30 tablet  3  . magnesium gluconate (MAGONATE) 500 MG tablet Take 500 mg by mouth daily.       . metFORMIN (GLUCOPHAGE) 1000 MG tablet Take 1 in the am and 1 1/2 in pm      . metoprolol (LOPRESSOR) 50 MG tablet Take 50 mg by mouth 2 (two) times daily.      . Multiple Vitamin (MULTIVITAMIN WITH MINERALS) TABS Take 1 tablet by mouth daily.      Marland Kitchen omeprazole (PRILOSEC OTC) 20 MG tablet Take 20 mg by mouth 2 (two) times daily.       Marland Kitchen oxymetazoline (AFRIN) 0.05 % nasal spray Place 2 sprays into the nose daily as needed for congestion.      . potassium chloride SA (K-DUR,KLOR-CON) 20 MEQ tablet Take 1 tablet (20 mEq total) by mouth daily.  30 tablet  6  . pravastatin (PRAVACHOL) 80 MG tablet TAKE 1 TABLET (80 MG TOTAL) BY MOUTH DAILY.  30 tablet  5   No current facility-administered medications on file prior to visit.    Allergies  Allergen Reactions  . Lipitor [Atorvastatin Calcium] Other (See Comments)    Reaction  unknown    Past Medical History  Diagnosis Date  . A-fib   . HTN (hypertension)   . Dyslipidemia   . PVD (peripheral vascular disease)     s/p Right CEA March, 2011,   . Claudication   . Hyperlipidemia   . GERD (gastroesophageal reflux disease)   . Diabetic neuropathy   . Pacemaker   . Stroke     ?  Marland Kitchen Anemia     ?  . Diastolic CHF 01/23/2991  . Exertional shortness of breath   . Type II diabetes mellitus     Past Surgical History  Procedure Laterality Date  . Carotid endarterectomy Right 03/2009  . Cholecystectomy    . Tonsillectomy and adenoidectomy    . Knee arthroscopy Left     "I had 3" (11/05/2012)  . Coronary artery bypass graft  06/14/2011    Procedure: CORONARY ARTERY BYPASS GRAFTING (CABG);  Surgeon: Gaye Pollack, MD;  Location: [facility];  Service: Open Heart Surgery;  Laterality: N/A;  Coronary Artery Bypass Graft on pump times six;  utilizing internal mammary artery and right greater saphenous vein harvested endoscopically.  . Maze  06/14/2011    Procedure: MAZE;  Surgeon: Gaye Pollack, MD;  Location: [facility];  Service: Open Heart Surgery;  Laterality: N/A;  Ligate left atrial appendage  . Circumcision    . Femoral-popliteal bypass graft Left 03/06/2012    Procedure: BYPASS GRAFT FEMORAL-POPLITEAL ARTERY;  Surgeon: Angelia Mould, MD;  Location: [facility] OR;  Service: Vascular;  Laterality: Left;  Left Femoral - Below Knee Popliteal Bypass Graft with Vein and Intraoperative Arteriogram.  . Total knee arthroplasty Left   . Insert / replace / remove pacemaker  2013    /notes 11/05/2012  . Lead revision  11/05/2012    pacemaker/notes 11/05/2012    History  Smoking status  . Former Smoker -- 3.00 packs/day for 32 years  . Types: Cigarettes  . Quit date: 01/16/1979  Smokeless tobacco  . Never Used    History  Alcohol Use  . 1.2 oz/week  . 2 Glasses of wine per week    Family History  Problem Relation Age of Onset  . Coronary artery disease Father   .  Hypertension Father   . Heart attack Mother   .  Coronary artery disease Mother   . Hypertension Sister   . Anesthesia problems Neg Hx     Reviw of Systems:  Reviewed in the HPI.  All other systems are negative.  Physical Exam: BP 171/98  Pulse 81  Ht 5' 9.5" (1.765 m)  Wt 243 lb (110.224 kg)  BMI 35.38 kg/m2 The patient is alert and oriented x 3.  The mood and affect are normal.   Skin: warm and dry.  Color is sallow,     HEENT:   Normocephalic/atraumatic. He has a right carotid endarterectomy scar. His carotids are 2+. No JVD. His neck is supple. The membranes are moist.  Lungs: Lungs are clear   Heart: Regular rate. No murmurs.  His sternotomy scar is healing well. His thoracostomy sites are healing well.  Abdomen: Good bowel sounds. + ascites. Tense.      Extremities:  Bilateral 1+ - 2+ edema   Neuro:  Neuro exam is nonfocal.    O2 sat 94%.  Increased to 97% with ambulation but he became very dyspneac.   O2 sats remained high (98%) through his recovery phase.    ECG:  Assessment / Plan:

## 2012-12-03 NOTE — Patient Instructions (Addendum)
You have been referred to [facility],  Your physician recommends that you return for lab work in: [place] physician recommends that you schedule a follow-up appointment in: 3 MONTHS

## 2012-12-04 ENCOUNTER — Encounter: Payer: Self-pay | Admitting: Internal Medicine

## 2012-12-04 LAB — VITAMIN B12: Vitamin B-12: 1220 pg/mL — ABNORMAL HIGH (ref 211–911)

## 2012-12-04 LAB — FOLATE: Folate: >20 ng/mL

## 2012-12-04 NOTE — Telephone Encounter (Signed)
Follow Up   Pt requests a call back to discuss BNP test/// please call

## 2012-12-05 ENCOUNTER — Encounter: Payer: Self-pay | Admitting: *Deleted

## 2012-12-05 ENCOUNTER — Other Ambulatory Visit: Payer: Self-pay | Admitting: Physician Assistant

## 2012-12-05 ENCOUNTER — Other Ambulatory Visit: Payer: Self-pay | Admitting: Vascular Surgery

## 2012-12-05 DIAGNOSIS — Z48812 Encounter for surgical aftercare following surgery on the circulatory system: Secondary | ICD-10-CM

## 2012-12-05 DIAGNOSIS — Z951 Presence of aortocoronary bypass graft: Secondary | ICD-10-CM | POA: Insufficient documentation

## 2012-12-06 ENCOUNTER — Encounter: Payer: Self-pay | Admitting: Family Medicine

## 2012-12-08 ENCOUNTER — Encounter: Payer: Self-pay | Admitting: Internal Medicine

## 2012-12-08 ENCOUNTER — Ambulatory Visit (INDEPENDENT_AMBULATORY_CARE_PROVIDER_SITE_OTHER): Payer: Federal, State, Local not specified - PPO | Admitting: Internal Medicine

## 2012-12-08 ENCOUNTER — Other Ambulatory Visit (INDEPENDENT_AMBULATORY_CARE_PROVIDER_SITE_OTHER): Payer: Federal, State, Local not specified - PPO

## 2012-12-08 VITALS — BP 160/90 | HR 88 | Wt 247.6 lb

## 2012-12-08 DIAGNOSIS — R195 Other fecal abnormalities: Secondary | ICD-10-CM

## 2012-12-08 DIAGNOSIS — D649 Anemia, unspecified: Secondary | ICD-10-CM

## 2012-12-08 DIAGNOSIS — R1900 Intra-abdominal and pelvic swelling, mass and lump, unspecified site: Secondary | ICD-10-CM

## 2012-12-08 DIAGNOSIS — K625 Hemorrhage of anus and rectum: Secondary | ICD-10-CM

## 2012-12-08 DIAGNOSIS — R198 Other specified symptoms and signs involving the digestive system and abdomen: Secondary | ICD-10-CM

## 2012-12-08 LAB — CBC
Platelets: 244 10*3/uL (ref 150.0–400.0)
RBC: 4.78 Mil/uL (ref 4.22–5.81)
WBC: 8.9 10*3/uL (ref 4.5–10.5)

## 2012-12-08 LAB — IBC PANEL
Saturation Ratios: 20.2 % (ref 20.0–50.0)
Transferrin: 350 mg/dL (ref 212.0–360.0)

## 2012-12-08 LAB — FERRITIN: Ferritin: 11.3 ng/mL — ABNORMAL LOW (ref 22.0–322.0)

## 2012-12-08 NOTE — Progress Notes (Signed)
Patient ID: Edward [name], male   DOB: [birth year], 72 y.o.   MRN: [id number] HPI: Mr. Edward [name] is a 72 yo male with PMH of CAD status post CABG, atrial fibrillation status post ablation with pacemaker placement, CHF, diabetes, hypertension, dyslipidemia, GERD, diverticulosis with GI bleeding felt to be diverticular hemorrhage July 2012, who is seen in consultation at request of Dr. Elder Cyphers to evaluate anemia and heme + stool.  He is here today with his wife. He was recent seen by his cardiologist to evaluate dyspnea and dyspnea on exertion who felt that he was mildly volume overloaded and increased his diuretics. He also was concerned that his anemia may be contributing. The patient reports a history of GI bleeding that dates back to 2012. He was hospitalized for several days and underwent upper endoscopy and colonoscopy during that hospitalization by Dr. Watt Climes.  At that time it was felt that he likely had diverticular bleeding in the setting of anticoagulation. He suddenly stopped anticoagulation and has been taking only an aspirin, which he himself stopped several weeks ago.  He does report seeing bright red blood per rectum 7 weeks ago. This was associated with wiping. It occurred with bowel movements only for approximately 2 days and resolved. Her last 7 weeks he denies rectal bleeding or melena. He also denies abdominal pain. He does endorse shortness of breath which he says has improved after increasing his Lasix. He reports he is eating well. He does have a history of heartburn and epigastric discomfort after eating but this improved with omeprazole. He is taking omeprazole 40 mg daily. He also reports when he was told about his anemia he resumed oral iron which he is taking twice daily. He started this approximately 2 weeks ago. He reports a long history of hematuria which was worked up in years past. He does not see gross hematuria recently. He denies nausea or vomiting. No fevers or chills. No trouble  swallowing.  Past Medical History  Diagnosis Date  . A-fib   . HTN (hypertension)   . Dyslipidemia   . PVD (peripheral vascular disease)     s/p Right CEA March, 2011,   . Claudication   . Hyperlipidemia   . GERD (gastroesophageal reflux disease)   . Diabetic neuropathy   . Pacemaker   . Stroke     ?  Marland Kitchen Anemia     ?  . Diastolic CHF 06/17/9526  . Exertional shortness of breath   . Type II diabetes mellitus     Past Surgical History  Procedure Laterality Date  . Carotid endarterectomy Right 03/2009  . Cholecystectomy    . Tonsillectomy and adenoidectomy    . Knee arthroscopy Left     "I had 3" (11/05/2012)  . Coronary artery bypass graft  06/14/2011    Procedure: CORONARY ARTERY BYPASS GRAFTING (CABG);  Surgeon: Gaye Pollack, MD;  Location: [facility];  Service: Open Heart Surgery;  Laterality: N/A;  Coronary Artery Bypass Graft on pump times six;  utilizing internal mammary artery and right greater saphenous vein harvested endoscopically.  . Maze  06/14/2011    Procedure: MAZE;  Surgeon: Gaye Pollack, MD;  Location: [facility];  Service: Open Heart Surgery;  Laterality: N/A;  Ligate left atrial appendage  . Circumcision    . Femoral-popliteal bypass graft Left 03/06/2012    Procedure: BYPASS GRAFT FEMORAL-POPLITEAL ARTERY;  Surgeon: Angelia Mould, MD;  Location: [facility];  Service: Vascular;  Laterality: Left;  Left Femoral - Below  Knee Popliteal Bypass Graft with Vein and Intraoperative Arteriogram.  . Total knee arthroplasty Left   . Insert / replace / remove pacemaker  2013    /notes 11/05/2012  . Lead revision  11/05/2012    pacemaker/notes 11/05/2012    Current Outpatient Prescriptions  Medication Sig Dispense Refill  . acetaminophen (TYLENOL) 500 MG tablet Take 500 mg by mouth every 6 (six) hours as needed.      . diphenhydramine-acetaminophen (TYLENOL PM) 25-500 MG TABS Take 1 tablet by mouth at bedtime as needed.      . Famotidine (PEPCID AC PO) Take by mouth at  bedtime.      . furosemide (LASIX) 40 MG tablet Take 2 tablets (80 mg total) by mouth daily.  180 tablet  3  . Iron TABS Take by mouth 2 (two) times daily.       Marland Kitchen l-methylfolate-B6-B12 (METANX) 3-35-2 MG TABS Take 1 tablet by mouth daily.      Marland Kitchen loratadine (CLARITIN) 10 MG tablet Take 10 mg by mouth daily.      Marland Kitchen losartan (COZAAR) 100 MG tablet Take 1 pill (100 mg) once daily.  30 tablet  3  . magnesium gluconate (MAGONATE) 500 MG tablet Take 500 mg by mouth daily.       . metFORMIN (GLUCOPHAGE) 1000 MG tablet Take 1 in the am and 1 1/2 in pm      . metoprolol (LOPRESSOR) 50 MG tablet Take 50 mg by mouth 2 (two) times daily.      . Multiple Vitamin (MULTIVITAMIN WITH MINERALS) TABS Take 1 tablet by mouth daily.      Marland Kitchen omeprazole (PRILOSEC OTC) 20 MG tablet Take 20 mg by mouth 2 (two) times daily.       Marland Kitchen oxymetazoline (AFRIN) 0.05 % nasal spray Place 2 sprays into the nose daily as needed for congestion.      . potassium chloride SA (K-DUR,KLOR-CON) 20 MEQ tablet Take 1 tablet (20 mEq total) by mouth daily.  30 tablet  6  . pravastatin (PRAVACHOL) 80 MG tablet TAKE 1 TABLET (80 MG TOTAL) BY MOUTH DAILY.  30 tablet  5  . metFORMIN (GLUCOPHAGE) 1000 MG tablet Take 1 tablet (1,000 mg total) by mouth 2 (two) times daily with a meal.  60 tablet  5   No current facility-administered medications for this visit.    Allergies  Allergen Reactions  . Lipitor [Atorvastatin Calcium] Other (See Comments)    Reaction unknown    Family History  Problem Relation Age of Onset  . Coronary artery disease Father   . Hypertension Father   . Heart attack Mother   . Coronary artery disease Mother   . Hypertension Sister   . Anesthesia problems Neg Hx     History  Substance Use Topics  . Smoking status: Former Smoker -- 3.00 packs/day for 32 years    Types: Cigarettes    Quit date: 01/16/1979  . Smokeless tobacco: Never Used  . Alcohol Use: 1.2 oz/week    2 Glasses of wine per week    ROS: As  per history of present illness, otherwise negative  BP 160/90  Pulse 88  Wt 247 lb 9.6 oz (112.311 kg)  SpO2 98% Constitutional: Well-developed and well-nourished. No distress. HEENT: Normocephalic and atraumatic. Oropharynx is clear and moist. No oropharyngeal exudate. Conjunctivae are normal.  No scleral icterus. Neck: Neck supple. Trachea midline. Cardiovascular: Normal rate, regular rhythm and intact distal pulses. Pulmonary/chest: Effort normal and breath sounds  normal. No wheezing, rales or rhonchi. Abdominal: Soft, obese, flanks are moderately bulging raising the question of ascites, nontender, Bowel sounds active throughout.  Extremities: no clubbing, cyanosis, trace LE edema right lower extremity, 1+ left lower extremity Neurological: Alert and oriented to person place and time. Skin: Skin is warm and dry. No rashes noted. Psychiatric: Normal mood and affect. Behavior is normal.  RELEVANT LABS AND IMAGING: CBC    Component Value Date/Time   WBC 10.8* 12/01/2012 0952   WBC 10.1 10/31/2012 1154   RBC 4.81 12/01/2012 0952   RBC 5.15 10/31/2012 1154   RBC 3.99* 07/31/2010 0713   HGB 9.9* 12/01/2012 0952   HGB 11.2* 10/31/2012 1154   HCT 33.5* 12/01/2012 0952   HCT 34.4* 10/31/2012 1154   PLT 187.0 10/31/2012 1154   MCV 69.7* 12/01/2012 0952   MCV 66.8 Repeated and verified X2.* 10/31/2012 1154   MCH 20.6* 12/01/2012 0952   MCH 21.8* 03/07/2012 0540   MCHC 29.6* 12/01/2012 0952   MCHC 32.5 10/31/2012 1154   RDW 20.0* 10/31/2012 1154   LYMPHSABS 1.4 10/31/2012 1154   MONOABS 1.0 10/31/2012 1154   EOSABS 0.5 10/31/2012 1154   BASOSABS 0.1 10/31/2012 1154    CMP     Component Value Date/Time   NA 137 12/01/2012 0815   NA 137 12/01/2012 0815   K 4.5 12/01/2012 0815   K 4.5 12/01/2012 0815   CL 105 12/01/2012 0815   CL 105 12/01/2012 0815   CO2 22 12/01/2012 0815   CO2 22 12/01/2012 0815   GLUCOSE 183* 12/01/2012 0815   GLUCOSE 183* 12/01/2012 0815   BUN 21  12/01/2012 0815   BUN 21 12/01/2012 0815   CREATININE 1.4 12/01/2012 0815   CREATININE 1.4 12/01/2012 0815   CREATININE 1.37* 09/20/2012 0838   CALCIUM 8.7 12/01/2012 0815   CALCIUM 8.7 12/01/2012 0815   PROT 6.9 12/03/2012 0917   ALBUMIN 3.6 12/03/2012 0917   AST 29 12/03/2012 0917   ALT 25 12/03/2012 0917   ALKPHOS 51 12/03/2012 0917   BILITOT 1.4* 12/03/2012 0917   GFRNONAA 63* 03/07/2012 0540   GFRAA 73* 03/07/2012 0540   Iron/TIBC/Ferritin    Component Value Date/Time   IRON 184* 12/01/2012 0939   TIBC 462* 12/01/2012 0939   FERRITIN 65 07/31/2010 0713   %sat = 40  EGD and colonoscopy 07/31/2010 - Dr. Clarene Essex - performed for GI bleeding EGD -- small hiatal hernia, few small gastric nodules which were biopsied and normal duodenum -- gastric biopsies reactive gastropathy, negative for H. Pylori Colonoscopy -- internal/external hemorrhoids, left greater than right diverticulosis, small distal colon polyp, mild hepatic flexure and transverse colitis biopsied -- pathology transverse biopsies = focally eroded, reactive/regenerative colonic mucosa with crypt atrophy, mild plasma cell lamina propria expansion and neutrophilic cryptitis and crypt abscesses. Negative for dysplasia. Comment: Primary considerations include infectious versus noninfectious ischemic injury. Biopsy findings are not that of idiopathic IBD.  Colon polyp = mucosal prolapse polyp  ASSESSMENT/PLAN:  72 yo male with PMH of CAD status post CABG, atrial fibrillation status post ablation with pacemaker placement, CHF, diabetes, hypertension, dyslipidemia, GERD, diverticulosis with GI bleeding felt to be diverticular hemorrhage July 2012, who is seen in consultation at request of Dr. Elder Cyphers to evaluate anemia and heme + stool.  1.  Microcytic anemia/heme + stool -- his anemia picture is somewhat confusing, as his iron and percent iron saturation were normal which argues against an iron deficiency. Ferritin was not drawn, and  I'm  repeating iron studies including ferritin today.  He has had a somewhat long-standing anemia though his hemoglobin earlier this month at 9.9 is the lowest he's been in the last several years. He did have bright red blood per rectum 7 weeks ago in isolated fashion but this stopped. This could be an from internal hemorrhoids, though we do not know for sure. We reviewed further workup including repeat endoscopy and colonoscopy, but at this point he is not interested. The inflammation seen in the transverse colon 2 years ago was most consistent with an ischemic injury, which would not be terribly unexpected in someone with extensive vascular disease such as he.  He is not having abdominal pain at present which one would associate with intestinal/colonic ischemia.  While my suspicion for a colonic malignancy is very low given his colonoscopy 2 years ago, I have discussed with him that I cannot be sure that he is not having chronic blood loss from his colon. Especially, in light of the inflammation seen in July 2012.  Given his reluctance for endoscopic procedures at present, I will repeat CBC today along with iron studies. I would like to see him back in 6-8 weeks. If his hemoglobin fails to improve or declines further, I will again recommend colonoscopy.    2.  Increasing abdominal girth -- question of ascites. I recommend abdominal ultrasound for further characterization and to exclude ascites. He will continue diuresis under the direction of his primary care doctor and cardiologist for congestive heart failure.

## 2012-12-08 NOTE — Patient Instructions (Signed)
Your physician has requested that you go to the basement for the following lab work before leaving today: CBC, [place]  If you see anymore rectal bleeding please call our office.  Follow up with Dr. Hilarie Fredrickson in office in 6-8 weeks

## 2012-12-09 ENCOUNTER — Telehealth: Payer: Self-pay | Admitting: Internal Medicine

## 2012-12-09 ENCOUNTER — Ambulatory Visit: Payer: Federal, State, Local not specified - PPO | Admitting: Emergency Medicine

## 2012-12-09 ENCOUNTER — Other Ambulatory Visit: Payer: Self-pay

## 2012-12-09 DIAGNOSIS — R0602 Shortness of breath: Secondary | ICD-10-CM

## 2012-12-09 DIAGNOSIS — D649 Anemia, unspecified: Secondary | ICD-10-CM

## 2012-12-09 DIAGNOSIS — R1900 Intra-abdominal and pelvic swelling, mass and lump, unspecified site: Secondary | ICD-10-CM

## 2012-12-09 DIAGNOSIS — K625 Hemorrhage of anus and rectum: Secondary | ICD-10-CM

## 2012-12-09 DIAGNOSIS — R195 Other fecal abnormalities: Secondary | ICD-10-CM

## 2012-12-09 MED ORDER — LOSARTAN POTASSIUM 100 MG PO TABS: ORAL_TABLET | ORAL | Status: DC

## 2012-12-09 NOTE — Telephone Encounter (Signed)
Informed wife that Dr Hilarie Fredrickson has not reviewed the results yet, but most are lower. I will call her after the results are reviewed; wife stated understanding.

## 2012-12-09 NOTE — Telephone Encounter (Signed)
See result note.  

## 2012-12-10 NOTE — Telephone Encounter (Signed)
Notes Recorded by Jerene Bears, MD on 12/09/2012 at 4:52 PM Labs from yesterday showed hemoglobin stable at 9.8, but remains microcytic with ferritin is low. After all, this is consistent with low iron anemia. He should resume his iron (I had asked him to hold it yesterday) Also I recommend that he strongly consider repeating the colonoscopy Finally an abdominal ultrasound to ensure the increased abdominal girth is not secondary to ascites  Informed pt's wife who asked to schedule the [place] and COLON. Informed her I scheduled the [place] for 12/12/12 at [facility] and gave her prep instructions and the time; wife stated understanding.

## 2012-12-12 ENCOUNTER — Ambulatory Visit (HOSPITAL_COMMUNITY)
Admission: RE | Admit: 2012-12-12 | Discharge: 2012-12-12 | Disposition: A | Discharge status: Home / Self Care | Payer: Federal, State, Local not specified - PPO | Source: Ambulatory Visit | Attending: Internal Medicine | Admitting: Internal Medicine

## 2012-12-12 DIAGNOSIS — R1900 Intra-abdominal and pelvic swelling, mass and lump, unspecified site: Secondary | ICD-10-CM

## 2012-12-12 DIAGNOSIS — R195 Other fecal abnormalities: Secondary | ICD-10-CM

## 2012-12-12 DIAGNOSIS — D649 Anemia, unspecified: Secondary | ICD-10-CM

## 2012-12-12 DIAGNOSIS — K625 Hemorrhage of anus and rectum: Secondary | ICD-10-CM

## 2012-12-12 DIAGNOSIS — Z9089 Acquired absence of other organs: Secondary | ICD-10-CM | POA: Insufficient documentation

## 2012-12-15 ENCOUNTER — Other Ambulatory Visit: Payer: Self-pay

## 2012-12-15 ENCOUNTER — Telehealth: Payer: Self-pay | Admitting: Internal Medicine

## 2012-12-15 DIAGNOSIS — D649 Anemia, unspecified: Secondary | ICD-10-CM

## 2012-12-15 NOTE — Telephone Encounter (Signed)
Pts wife states her husband is still not feeling well. Discussed last OV note with her and it mentioned to pts reluctance to do colon and endo and would follow-up in 6-8 weeks. Wife states he is not reluctant and they would like to schedule the procedures. Also asking if he needs to have his CBC checked again. How soon would you want to do procedures? Is he appropriate for the [place]? Please advise.

## 2012-12-15 NOTE — Telephone Encounter (Signed)
Please see below for procedures at the hospital.

## 2012-12-15 NOTE — Telephone Encounter (Signed)
Please schedule pt for a followup appt with Dr. Hilarie Fredrickson in 6-8 weeks.

## 2012-12-15 NOTE — Telephone Encounter (Signed)
Will need procedures at hospital Colonoscopy with EGD, MAC CBC tomorrow

## 2012-12-15 NOTE — Telephone Encounter (Signed)
Pt knows to come in for CBC tomorrow. Wife knows we will call her back regarding the procedures being scheduled at the hospital.

## 2012-12-16 ENCOUNTER — Other Ambulatory Visit (INDEPENDENT_AMBULATORY_CARE_PROVIDER_SITE_OTHER): Payer: Federal, State, Local not specified - PPO

## 2012-12-16 DIAGNOSIS — D649 Anemia, unspecified: Secondary | ICD-10-CM

## 2012-12-16 LAB — CBC WITH DIFFERENTIAL/PLATELET
Basophils Relative: 0.3 % (ref 0.0–3.0)
Eosinophils Relative: 4.2 % (ref 0.0–5.0)
Lymphocytes Relative: 11.6 % — ABNORMAL LOW (ref 12.0–46.0)
Monocytes Relative: 11.1 % (ref 3.0–12.0)
Neutrophils Relative %: 72.8 % (ref 43.0–77.0)
RBC: 5.36 Mil/uL (ref 4.22–5.81)
WBC: 9.6 10*3/uL (ref 4.5–10.5)

## 2012-12-17 ENCOUNTER — Other Ambulatory Visit: Payer: Self-pay | Admitting: Gastroenterology

## 2012-12-17 ENCOUNTER — Telehealth: Payer: Self-pay | Admitting: Gastroenterology

## 2012-12-17 NOTE — Telephone Encounter (Signed)
Spoke to pt told him his procedure is scheduled for 02/17/2013 @ 8:30am @ [facility]. Pt said he has 2 months to think about it, doesn't feel he needs it.he will call and let us know. I told him instructions are coming in the mail to call me when he gets them so we can go over them. Pt verbalized understanding

## 2012-12-23 ENCOUNTER — Ambulatory Visit: Payer: Federal, State, Local not specified - PPO | Admitting: Internal Medicine

## 2012-12-28 ENCOUNTER — Other Ambulatory Visit: Payer: Self-pay | Admitting: Internal Medicine

## 2013-01-01 ENCOUNTER — Telehealth: Payer: Self-pay

## 2013-01-01 MED ORDER — METFORMIN HCL 1000 MG PO TABS: ORAL_TABLET | ORAL | Status: DC

## 2013-01-01 NOTE — Telephone Encounter (Signed)
Resent. Was written as indicated. Patient advised.

## 2013-01-01 NOTE — Telephone Encounter (Signed)
Patient needs a refill on Metformin 1073m. States that it is currently written as 1 tablet in the AM and 1 tablet in the PM however it should be 1 tablet in the AM and 1 1/2 tablets in the evening.   [facility]  Please call Mimi [phone]

## 2013-01-05 ENCOUNTER — Encounter: Payer: Self-pay | Admitting: *Deleted

## 2013-01-19 ENCOUNTER — Encounter: Payer: Self-pay | Admitting: Internal Medicine

## 2013-01-26 ENCOUNTER — Encounter (HOSPITAL_COMMUNITY): Payer: Self-pay | Admitting: Pharmacy Technician

## 2013-01-27 ENCOUNTER — Other Ambulatory Visit: Payer: Self-pay | Admitting: Gastroenterology

## 2013-02-09 ENCOUNTER — Encounter (HOSPITAL_COMMUNITY): Payer: Self-pay | Admitting: *Deleted

## 2013-02-11 ENCOUNTER — Other Ambulatory Visit: Payer: Self-pay | Admitting: *Deleted

## 2013-02-11 DIAGNOSIS — E785 Hyperlipidemia, unspecified: Secondary | ICD-10-CM

## 2013-02-11 DIAGNOSIS — Z0000 Encounter for general adult medical examination without abnormal findings: Secondary | ICD-10-CM

## 2013-02-11 DIAGNOSIS — I2510 Atherosclerotic heart disease of native coronary artery without angina pectoris: Secondary | ICD-10-CM

## 2013-02-11 DIAGNOSIS — Z951 Presence of aortocoronary bypass graft: Secondary | ICD-10-CM

## 2013-02-11 DIAGNOSIS — I10 Essential (primary) hypertension: Secondary | ICD-10-CM

## 2013-02-11 NOTE — Progress Notes (Signed)
PT NEEDS FAA PHYSICAL/ PT CAN NOT WALK TREADMILL/ LEXI ORDERED.

## 2013-02-12 ENCOUNTER — Telehealth: Payer: Self-pay | Admitting: Internal Medicine

## 2013-02-13 ENCOUNTER — Encounter: Payer: Federal, State, Local not specified - PPO | Admitting: Internal Medicine

## 2013-02-13 NOTE — Telephone Encounter (Signed)
Explained to pt that Dr Hilarie Fredrickson does not do Hemorroidial banding at present, but may begin internal banding in the future. He states the hemorhroid does not bother him, he's tired of exams where they tell him he has a hemorrhoid. Explained usually surgeons remove external hemorrhoids and then we had a long discussion on why he is having a COLON in the 1st place. Read to him notes from his last CBC and that the EGD was removed, but not the COLON and his wife was aware . He then hung up on me.

## 2013-02-16 ENCOUNTER — Ambulatory Visit (INDEPENDENT_AMBULATORY_CARE_PROVIDER_SITE_OTHER): Payer: Federal, State, Local not specified - PPO | Admitting: Internal Medicine

## 2013-02-16 ENCOUNTER — Other Ambulatory Visit: Payer: Self-pay | Admitting: Gastroenterology

## 2013-02-16 ENCOUNTER — Encounter: Payer: Self-pay | Admitting: Internal Medicine

## 2013-02-16 VITALS — BP 161/98 | HR 85 | Ht 69.5 in | Wt 245.0 lb

## 2013-02-16 DIAGNOSIS — Z950 Presence of cardiac pacemaker: Secondary | ICD-10-CM

## 2013-02-16 DIAGNOSIS — I498 Other specified cardiac arrhythmias: Secondary | ICD-10-CM

## 2013-02-16 DIAGNOSIS — I4891 Unspecified atrial fibrillation: Secondary | ICD-10-CM

## 2013-02-16 DIAGNOSIS — I509 Heart failure, unspecified: Secondary | ICD-10-CM

## 2013-02-16 DIAGNOSIS — Z9889 Other specified postprocedural states: Secondary | ICD-10-CM

## 2013-02-16 DIAGNOSIS — I495 Sick sinus syndrome: Secondary | ICD-10-CM

## 2013-02-16 DIAGNOSIS — Z8679 Personal history of other diseases of the circulatory system: Secondary | ICD-10-CM

## 2013-02-16 DIAGNOSIS — I5030 Unspecified diastolic (congestive) heart failure: Secondary | ICD-10-CM

## 2013-02-16 LAB — MDC_IDC_ENUM_SESS_TYPE_INCLINIC
Battery Remaining Longevity: 94 mo
Brady Statistic AP VP Percent: 61 %
Brady Statistic AP VS Percent: 0 %
Brady Statistic AS VP Percent: 38 %
Date Time Interrogation Session: 20150202154606
Lead Channel Impedance Value: 446 Ohm
Lead Channel Impedance Value: 697 Ohm
Lead Channel Pacing Threshold Amplitude: 0.75 V
Lead Channel Pacing Threshold Amplitude: 0.75 V
Lead Channel Pacing Threshold Pulse Width: 0.4 ms
Lead Channel Pacing Threshold Pulse Width: 0.4 ms
Lead Channel Sensing Intrinsic Amplitude: 0.25 mV
Lead Channel Setting Pacing Amplitude: 2 V
MDC IDC MSMT BATTERY IMPEDANCE: 162 Ohm
MDC IDC MSMT BATTERY VOLTAGE: 2.78 V
MDC IDC SET LEADCHNL RV PACING AMPLITUDE: 2.5 V
MDC IDC SET LEADCHNL RV PACING PULSEWIDTH: 0.4 ms
MDC IDC SET LEADCHNL RV SENSING SENSITIVITY: 2.8 mV
MDC IDC STAT BRADY AS VS PERCENT: 0 %

## 2013-02-16 MED ORDER — MOVIPREP 100 G PO SOLR: ORAL | Status: DC

## 2013-02-16 NOTE — Assessment & Plan Note (Signed)
holidng sinus

## 2013-02-16 NOTE — Assessment & Plan Note (Signed)
As above.

## 2013-02-16 NOTE — Anesthesia Preprocedure Evaluation (Addendum)
Anesthesia Evaluation  Patient identified by MRN, date of birth, ID band Patient awake    Reviewed: Allergy & Precautions, H&P , NPO status , Patient's Chart, lab work & pertinent test results  Airway Mallampati: II  Neck ROM: full    Dental  (+) Teeth Intact and Caps   Pulmonary shortness of breath and with exertion, former smoker,  breath sounds clear to auscultation  Pulmonary exam normal       Cardiovascular hypertension, + CAD, + CABG, + Peripheral Vascular Disease (s/p right CEA 3/11) and +CHF + dysrhythmias Atrial Fibrillation + pacemaker Rhythm:Regular Rate:Normal     Neuro/Psych CVA    GI/Hepatic GERD-  ,  Endo/Other  diabetes, Type 2  Renal/GU Renal disease     Musculoskeletal  (+) Arthritis -,   Abdominal   Peds  Hematology  (+) anemia ,   Anesthesia Other Findings   Reproductive/Obstetrics                          Anesthesia Physical Anesthesia Plan  ASA: III  Anesthesia Plan: MAC   Post-op Pain Management:    Induction: Intravenous  Airway Management Planned: Simple Face Mask and Nasal Cannula  Additional Equipment:   Intra-op Plan:   Post-operative Plan:   Informed Consent: I have reviewed the patients History and Physical, chart, labs and discussed the procedure including the risks, benefits and alternatives for the proposed anesthesia with the patient or authorized representative who has indicated his/her understanding and acceptance.   Dental advisory given  Plan Discussed with: CRNA  Anesthesia Plan Comments:         Anesthesia Quick Evaluation

## 2013-02-16 NOTE — Assessment & Plan Note (Signed)
imporved

## 2013-02-16 NOTE — Patient Instructions (Signed)
Your physician recommends that you continue on your current medications as directed. Please refer to the Current Medication list given to you today.  Your physician wants you to follow-up in: 6 months with device clinic.  You will receive a reminder letter in the mail two months in advance. If you don't receive a letter, please call our office to schedule the follow-up appointment.  Your physician wants you to follow-up in: 1 year with Dr. Caryl Comes.  You will receive a reminder letter in the mail two months in advance. If you don't receive a letter, please call our office to schedule the follow-up appointment.

## 2013-02-16 NOTE — Progress Notes (Signed)
Patient Care Team: [name], MD as PCP - General (Internal Medicine) Elayne Snare, MD as Attending Physician (Endocrinology) Angelia Mould, MD as Attending Physician (Vascular Surgery) Sherren Mocha, MD as Attending Physician (Cardiology) [name], MD as Attending Physician (Cardiology) Gaye Pollack, MD as Attending Physician (Cardiothoracic Surgery) Deboraha Sprang, MD as Attending Physician (Cardiology)   HPI  Edward [name] is a 73 y.o. male Seen in followup for pacemaker implanted for sinus node dysfunction and continue rapid junctional rhythm. This emerged post bypass surgery with concomitant maze.  And we last saw him he had complaints of dyspnea on exertion and we modified his diuretics. This improved his edema. He continues to complain of shortness of breath.   We undertook pacing for atrial lead revision. It turned out that there is significantly compartmentalized atrial activity and we finally were able to find a site we can capture in case the atrium;  there was no significant interval improvement.  He also was consulted seeing Dr. Melvyn Novas  for his dyspnea;  he was also noted to have guaiac positive stools and saw GI.  Knowing all of the above, I was flabbergasted he said that he was doing well and that he had no complaints.  Past Medical History  Diagnosis Date  . A-fib   . HTN (hypertension)   . Dyslipidemia   . PVD (peripheral vascular disease)     s/p Right CEA March, 2011,   . Claudication   . Hyperlipidemia   . GERD (gastroesophageal reflux disease)   . Diabetic neuropathy     mostly feet/ legs  . Pacemaker   . Stroke     ?  Marland Kitchen Anemia     ?  . Diastolic CHF 05/15/6999  . Exertional shortness of breath   . Type II diabetes mellitus     Past Surgical History  Procedure Laterality Date  . Carotid endarterectomy Right 03/2009  . Cholecystectomy    . Tonsillectomy and adenoidectomy    . Knee arthroscopy Left     "I had 3"  (11/05/2012)  . Maze  06/14/2011    Procedure: MAZE;  Surgeon: Gaye Pollack, MD;  Location: [place];  Service: Open Heart Surgery;  Laterality: N/A;  Ligate left atrial appendage  . Circumcision    . Femoral-popliteal bypass graft Left 03/06/2012    Procedure: BYPASS GRAFT FEMORAL-POPLITEAL ARTERY;  Surgeon: Angelia Mould, MD;  Location: [facility] OR;  Service: Vascular;  Laterality: Left;  Left Femoral - Below Knee Popliteal Bypass Graft with Vein and Intraoperative Arteriogram.  . Total knee arthroplasty Left   . Lead revision  11/05/2012    pacemaker/notes 11/05/2012  . Coronary artery bypass graft  06/14/2011    Procedure: CORONARY ARTERY BYPASS GRAFTING (CABG);  Surgeon: Gaye Pollack, MD;  Location: [place];  Service: Open Heart Surgery;  Laterality: N/A;  Coronary Artery Bypass Graft on pump times six;  utilizing internal mammary artery and right greater saphenous vein harvested endoscopically.x 5 vessels  . Insert / replace / remove pacemaker  2013    /notes 11/05/2012-[employer]  . Joint replacement Left     left TKA    Current Outpatient Prescriptions  Medication Sig Dispense Refill  . acetaminophen (TYLENOL) 500 MG tablet Take 500 mg by mouth every 6 (six) hours as needed for mild pain or moderate pain.       Marland Kitchen aspirin 81 MG tablet Take 81 mg by mouth daily.      Marland Kitchen  diphenhydramine-acetaminophen (TYLENOL PM) 25-500 MG TABS Take 1 tablet by mouth at bedtime as needed (sleep).       . furosemide (LASIX) 40 MG tablet Take 80 mg by mouth daily.      . Iron Combinations (IRON COMPLEX PO) Take 2 capsules by mouth daily.      Marland Kitchen L-Methylfolate-Algae-B12-B6 (METANX) 3-90.314-2-35 MG CAPS daily.      Marland Kitchen loratadine (CLARITIN) 10 MG tablet Take 10 mg by mouth daily.      Marland Kitchen losartan (COZAAR) 100 MG tablet Take 100 mg by mouth at bedtime.      . magnesium gluconate (MAGONATE) 500 MG tablet Take 500 mg by mouth daily.       . metFORMIN (GLUCOPHAGE) 1000 MG tablet Take 500-1,500 mg by mouth 2 (two)  times daily. Take 1 in the am and 1 1/2 in pm      . metoprolol (LOPRESSOR) 50 MG tablet Take 50 mg by mouth 2 (two) times daily.      Marland Kitchen MOVIPREP 100 G SOLR Use per prep instruction  1 kit  0  . Multiple Vitamin (MULTIVITAMIN WITH MINERALS) TABS Take 1 tablet by mouth daily.      Marland Kitchen omeprazole (PRILOSEC) 20 MG capsule daily.      Marland Kitchen oxymetazoline (AFRIN) 0.05 % nasal spray Place 2 sprays into the nose daily as needed for congestion.      . potassium chloride SA (K-DUR,KLOR-CON) 20 MEQ tablet Take 20 mEq by mouth daily.      . pravastatin (PRAVACHOL) 80 MG tablet Take 80 mg by mouth daily.       No current facility-administered medications for this visit.    Allergies  Allergen Reactions  . Lipitor [Atorvastatin Calcium] Other (See Comments)    Reaction unknown    Review of Systems negative except from HPI and PMH  Physical Exam BP 161/98  Pulse 85  Ht 5' 9.5" (1.765 m)  Wt 245 lb (111.131 kg)  BMI 35.67 kg/m2 Well developed and well nourished in no acute distress HENT normal E scleral and icterus clear Neck Supple JVP flat; carotids brisk and full Clear to ausculation  Device pocket well healed; without hematoma or erythema.  There is no tethering  Regular rate and rhythm, no murmurs gallops or rub Soft with active bowel sounds No clubbing cyanosis trace Edema Alert and oriented, grossly normal motor and sensory function Skin Warm and Dry  ECG AV pacing   Assessment and  Plan

## 2013-02-16 NOTE — Assessment & Plan Note (Signed)
100% atrial pacing  We were able to capture atrium and avoid junctional rhtyhm

## 2013-02-16 NOTE — Assessment & Plan Note (Signed)
Sinus node dysfunctionas a result

## 2013-02-16 NOTE — Assessment & Plan Note (Signed)
The patient's device was interrogated.  The information was reviewed. Device wea reprofgrammed DDD>>>MVP  A-->>D

## 2013-02-17 ENCOUNTER — Ambulatory Visit (HOSPITAL_COMMUNITY)
Admission: RE | Admit: 2013-02-17 | Discharge: 2013-02-17 | Disposition: A | Discharge status: Home / Self Care | Payer: Federal, State, Local not specified - PPO | Source: Ambulatory Visit | Attending: Internal Medicine | Admitting: Internal Medicine

## 2013-02-17 ENCOUNTER — Ambulatory Visit (HOSPITAL_COMMUNITY): Payer: Federal, State, Local not specified - PPO | Admitting: Anesthesiology

## 2013-02-17 ENCOUNTER — Encounter (HOSPITAL_COMMUNITY)
Admission: RE | Disposition: A | Discharge status: Home / Self Care | Payer: Self-pay | Source: Ambulatory Visit | Attending: Internal Medicine

## 2013-02-17 ENCOUNTER — Encounter (HOSPITAL_COMMUNITY): Payer: Federal, State, Local not specified - PPO | Admitting: Anesthesiology

## 2013-02-17 ENCOUNTER — Encounter (HOSPITAL_COMMUNITY): Payer: Self-pay | Admitting: *Deleted

## 2013-02-17 ENCOUNTER — Other Ambulatory Visit: Payer: Self-pay | Admitting: Internal Medicine

## 2013-02-17 DIAGNOSIS — E785 Hyperlipidemia, unspecified: Secondary | ICD-10-CM | POA: Insufficient documentation

## 2013-02-17 DIAGNOSIS — Z950 Presence of cardiac pacemaker: Secondary | ICD-10-CM | POA: Insufficient documentation

## 2013-02-17 DIAGNOSIS — R195 Other fecal abnormalities: Secondary | ICD-10-CM | POA: Insufficient documentation

## 2013-02-17 DIAGNOSIS — I509 Heart failure, unspecified: Secondary | ICD-10-CM | POA: Insufficient documentation

## 2013-02-17 DIAGNOSIS — K2960 Other gastritis without bleeding: Secondary | ICD-10-CM | POA: Insufficient documentation

## 2013-02-17 DIAGNOSIS — Z951 Presence of aortocoronary bypass graft: Secondary | ICD-10-CM | POA: Insufficient documentation

## 2013-02-17 DIAGNOSIS — E1142 Type 2 diabetes mellitus with diabetic polyneuropathy: Secondary | ICD-10-CM | POA: Insufficient documentation

## 2013-02-17 DIAGNOSIS — Z96659 Presence of unspecified artificial knee joint: Secondary | ICD-10-CM | POA: Insufficient documentation

## 2013-02-17 DIAGNOSIS — I10 Essential (primary) hypertension: Secondary | ICD-10-CM | POA: Insufficient documentation

## 2013-02-17 DIAGNOSIS — I2510 Atherosclerotic heart disease of native coronary artery without angina pectoris: Secondary | ICD-10-CM | POA: Insufficient documentation

## 2013-02-17 DIAGNOSIS — K5730 Diverticulosis of large intestine without perforation or abscess without bleeding: Secondary | ICD-10-CM | POA: Insufficient documentation

## 2013-02-17 DIAGNOSIS — D509 Iron deficiency anemia, unspecified: Secondary | ICD-10-CM | POA: Insufficient documentation

## 2013-02-17 DIAGNOSIS — I4891 Unspecified atrial fibrillation: Secondary | ICD-10-CM | POA: Insufficient documentation

## 2013-02-17 DIAGNOSIS — J3489 Other specified disorders of nose and nasal sinuses: Secondary | ICD-10-CM | POA: Insufficient documentation

## 2013-02-17 DIAGNOSIS — Z87891 Personal history of nicotine dependence: Secondary | ICD-10-CM | POA: Insufficient documentation

## 2013-02-17 DIAGNOSIS — Z9089 Acquired absence of other organs: Secondary | ICD-10-CM | POA: Insufficient documentation

## 2013-02-17 DIAGNOSIS — E1149 Type 2 diabetes mellitus with other diabetic neurological complication: Secondary | ICD-10-CM | POA: Insufficient documentation

## 2013-02-17 DIAGNOSIS — K644 Residual hemorrhoidal skin tags: Secondary | ICD-10-CM | POA: Insufficient documentation

## 2013-02-17 DIAGNOSIS — I5030 Unspecified diastolic (congestive) heart failure: Secondary | ICD-10-CM | POA: Insufficient documentation

## 2013-02-17 DIAGNOSIS — K219 Gastro-esophageal reflux disease without esophagitis: Secondary | ICD-10-CM | POA: Insufficient documentation

## 2013-02-17 DIAGNOSIS — Z888 Allergy status to other drugs, medicaments and biological substances status: Secondary | ICD-10-CM | POA: Insufficient documentation

## 2013-02-17 DIAGNOSIS — D126 Benign neoplasm of colon, unspecified: Secondary | ICD-10-CM

## 2013-02-17 HISTORY — PX: ESOPHAGOGASTRODUODENOSCOPY (EGD) WITH PROPOFOL: SHX5813

## 2013-02-17 HISTORY — PX: COLONOSCOPY WITH PROPOFOL: SHX5780

## 2013-02-17 HISTORY — DX: Myoneural disorder, unspecified: G70.9

## 2013-02-17 HISTORY — PX: BALLOON DILATION: SHX5330

## 2013-02-17 LAB — BASIC METABOLIC PANEL
BUN: 14 mg/dL (ref 6–23)
CALCIUM: 8 mg/dL — AB (ref 8.4–10.5)
CHLORIDE: 100 meq/L (ref 96–112)
CO2: 22 meq/L (ref 19–32)
Creatinine, Ser: 1.22 mg/dL (ref 0.50–1.35)
GFR calc Af Amer: 68 mL/min — ABNORMAL LOW (ref >90)
GFR calc non Af Amer: 58 mL/min — ABNORMAL LOW (ref >90)
Glucose, Bld: 209 mg/dL — ABNORMAL HIGH (ref 70–99)
POTASSIUM: 3.8 meq/L (ref 3.7–5.3)
SODIUM: 137 meq/L (ref 137–147)

## 2013-02-17 LAB — GLUCOSE, CAPILLARY
GLUCOSE-CAPILLARY: 186 mg/dL — AB (ref 70–99)
GLUCOSE-CAPILLARY: 220 mg/dL — AB (ref 70–99)

## 2013-02-17 LAB — HEMOGLOBIN AND HEMATOCRIT, BLOOD
HEMATOCRIT: 38.6 % — AB (ref 39.0–52.0)
Hemoglobin: 12.1 g/dL — ABNORMAL LOW (ref 13.0–17.0)

## 2013-02-17 SURGERY — ESOPHAGOGASTRODUODENOSCOPY (EGD) WITH PROPOFOL
Anesthesia: Monitor Anesthesia Care

## 2013-02-17 MED ORDER — FENTANYL CITRATE 0.05 MG/ML IJ SOLN
INTRAMUSCULAR | Status: DC | PRN
  Administered 2013-02-17 (×3): 25 ug via INTRAVENOUS

## 2013-02-17 MED ORDER — LACTATED RINGERS IV SOLN
INTRAVENOUS | Status: DC
  Administered 2013-02-17: 1000 mL via INTRAVENOUS

## 2013-02-17 MED ORDER — PROPOFOL 10 MG/ML IV BOLUS
INTRAVENOUS | Status: AC
  Filled 2013-02-17: qty 20

## 2013-02-17 MED ORDER — LACTATED RINGERS IV SOLN
INTRAVENOUS | Status: DC | PRN
  Administered 2013-02-17: 08:00:00 via INTRAVENOUS

## 2013-02-17 MED ORDER — PROPOFOL INFUSION 10 MG/ML OPTIME
INTRAVENOUS | Status: DC | PRN
  Administered 2013-02-17: 75 ug/kg/min via INTRAVENOUS

## 2013-02-17 MED ORDER — SODIUM CHLORIDE 0.9 % IV SOLN: INTRAVENOUS | Status: DC

## 2013-02-17 MED ORDER — ONDANSETRON HCL 4 MG/2ML IJ SOLN
INTRAMUSCULAR | Status: AC
  Filled 2013-02-17: qty 4

## 2013-02-17 MED ORDER — MIDAZOLAM HCL 5 MG/5ML IJ SOLN
INTRAMUSCULAR | Status: DC | PRN
  Administered 2013-02-17: 0.5 mg via INTRAVENOUS
  Administered 2013-02-17: 1 mg via INTRAVENOUS

## 2013-02-17 MED ORDER — HYDRALAZINE HCL 20 MG/ML IJ SOLN
INTRAMUSCULAR | Status: AC
  Filled 2013-02-17: qty 1

## 2013-02-17 MED ORDER — SODIUM CHLORIDE 0.9 % IJ SOLN
INTRAMUSCULAR | Status: AC
  Filled 2013-02-17: qty 10

## 2013-02-17 MED ORDER — ONDANSETRON HCL 4 MG/2ML IJ SOLN
INTRAMUSCULAR | Status: DC | PRN
  Administered 2013-02-17 (×2): 2 mg via INTRAVENOUS

## 2013-02-17 MED ORDER — KETAMINE HCL 10 MG/ML IJ SOLN
INTRAMUSCULAR | Status: AC
Start: 2013-02-17 — End: 2013-02-17
  Filled 2013-02-17: qty 1

## 2013-02-17 MED ORDER — PHENYLEPHRINE HCL 10 MG/ML IJ SOLN
INTRAMUSCULAR | Status: AC
  Filled 2013-02-17: qty 1

## 2013-02-17 MED ORDER — ONDANSETRON HCL 4 MG/2ML IJ SOLN
INTRAMUSCULAR | Status: AC
  Filled 2013-02-17: qty 2

## 2013-02-17 MED ORDER — EPHEDRINE SULFATE 50 MG/ML IJ SOLN
INTRAMUSCULAR | Status: AC
  Filled 2013-02-17: qty 1

## 2013-02-17 MED ORDER — HYDRALAZINE HCL 20 MG/ML IJ SOLN
INTRAMUSCULAR | Status: DC | PRN
  Administered 2013-02-17 (×3): 5 mg via INTRAVENOUS

## 2013-02-17 MED ORDER — SUCRALFATE 1 G PO TABS: 1.0000 g | ORAL_TABLET | Freq: Three times a day (TID) | ORAL | Status: DC

## 2013-02-17 MED ORDER — FENTANYL CITRATE 0.05 MG/ML IJ SOLN
INTRAMUSCULAR | Status: AC
  Filled 2013-02-17: qty 2

## 2013-02-17 MED ORDER — KETAMINE HCL 10 MG/ML IJ SOLN
INTRAMUSCULAR | Status: DC | PRN
  Administered 2013-02-17: 5 mg via INTRAVENOUS
  Administered 2013-02-17 (×2): 10 mg via INTRAVENOUS

## 2013-02-17 MED ORDER — MIDAZOLAM HCL 2 MG/2ML IJ SOLN
INTRAMUSCULAR | Status: AC
  Filled 2013-02-17: qty 2

## 2013-02-17 SURGICAL SUPPLY — 24 items

## 2013-02-17 NOTE — Transfer of Care (Signed)
Immediate Anesthesia Transfer of Care Note  Patient: Edward [name]  Procedure(s) Performed: Procedure(s): ESOPHAGOGASTRODUODENOSCOPY (EGD) WITH PROPOFOL (N/A) BALLOON DILATION (N/A) COLONOSCOPY WITH PROPOFOL (N/A)  Patient Location: PACU  Anesthesia Type:MAC  Level of Consciousness: awake, alert , oriented and patient cooperative  Airway & Oxygen Therapy: Patient Spontanous Breathing and Patient connected to nasal cannula oxygen  Post-op Assessment: Report given to PACU RN and Post -op Vital signs reviewed and stable  Post vital signs: stable  Complications: No apparent anesthesia complications

## 2013-02-17 NOTE — H&P (Signed)
HPI: Edward [name] is a 73 yo male with PMH of CAD status post CABG, atrial fibrillation status post ablation with pacemaker placement, CHF, diabetes, hypertension, dyslipidemia, GERD, diverticulosis with GI bleeding felt to be diverticular hemorrhage July 2012, who presents for upper endoscopy and colonoscopy for evaluation of heme-positive stools and iron deficiency anemia. He was seen in the office in late November 2014. He has been followed closely by cardiology and also seen pulmonary. They worked on diuretics for mild volume overload and dyspnea. Overall he feels well today, though he does have some nasal congestion. No recent bleeding including no melena or blood in his stool. He has been on iron replacement therapy since been found to be iron deficient after his visit with me in November.   Denies abd pain, n/v.  No recent diarrhea.  Past Medical History  Diagnosis Date  . A-fib   . HTN (hypertension)   . Dyslipidemia   . PVD (peripheral vascular disease)     s/p Right CEA March, 2011,   . Claudication   . Hyperlipidemia   . GERD (gastroesophageal reflux disease)   . Diabetic neuropathy     mostly feet/ legs  . Pacemaker   . Stroke     ?  Marland Kitchen Anemia     ?  . Diastolic CHF 08/18/4194  . Exertional shortness of breath   . Type II diabetes mellitus   . Neuromuscular disorder     neuropathy    Past Surgical History  Procedure Laterality Date  . Carotid endarterectomy Right 03/2009  . Cholecystectomy    . Tonsillectomy and adenoidectomy    . Knee arthroscopy Left     "I had 3" (11/05/2012)  . Maze  06/14/2011    Procedure: MAZE;  Surgeon: Gaye Pollack, MD;  Location: [place];  Service: Open Heart Surgery;  Laterality: N/A;  Ligate left atrial appendage  . Circumcision    . Femoral-popliteal bypass graft Left 03/06/2012    Procedure: BYPASS GRAFT FEMORAL-POPLITEAL ARTERY;  Surgeon: Angelia Mould, MD;  Location: [facility] OR;  Service: Vascular;  Laterality: Left;  Left Femoral -  Below Knee Popliteal Bypass Graft with Vein and Intraoperative Arteriogram.  . Total knee arthroplasty Left   . Lead revision  11/05/2012    pacemaker/notes 11/05/2012  . Coronary artery bypass graft  06/14/2011    Procedure: CORONARY ARTERY BYPASS GRAFTING (CABG);  Surgeon: Gaye Pollack, MD;  Location: [place];  Service: Open Heart Surgery;  Laterality: N/A;  Coronary Artery Bypass Graft on pump times six;  utilizing internal mammary artery and right greater saphenous vein harvested endoscopically.x 5 vessels  . Insert / replace / remove pacemaker  2013    /notes 11/05/2012-[employer]  . Joint replacement Left     left TKA    Current Facility-Administered Medications  Medication Dose Route Frequency Provider Last Rate Last Dose  . 0.9 %  sodium chloride infusion   Intravenous Continuous Jerene Bears, MD      . lactated ringers infusion   Intravenous Continuous Jerene Bears, MD 20 mL/hr at 02/17/13 0733 1,000 mL at 02/17/13 2229   Facility-Administered Medications Ordered in Other Encounters  Medication Dose Route Frequency Provider Last Rate Last Dose  . hydrALAZINE (APRESOLINE) injection    Anesthesia Intra-op Sherry Ruffing, CRNA   5 mg at 02/17/13 0800  . lactated ringers infusion    Continuous PRN Sherry Ruffing, CRNA      . midazolam (VERSED) 5 MG/5ML injection  Anesthesia Intra-op Sherry Ruffing, CRNA   1 mg at 02/17/13 0815  . ondansetron (ZOFRAN) injection   Intravenous Anesthesia Intra-op Sherry Ruffing, CRNA   2 mg at 02/17/13 0815    Allergies  Allergen Reactions  . Lipitor [Atorvastatin Calcium] Other (See Comments)    Reaction unknown    Family History  Problem Relation Age of Onset  . Coronary artery disease Father   . Hypertension Father   . Heart attack Mother   . Coronary artery disease Mother   . Hypertension Sister   . Anesthesia problems Neg Hx     History  Substance Use Topics  . Smoking status: Former Smoker -- 3.00 packs/day for 32  years    Types: Cigarettes    Quit date: 01/16/1979  . Smokeless tobacco: Never Used  . Alcohol Use: 1.2 oz/week    2 Glasses of wine per week     Comment: beer 1-2 weekly    ROS: As per history of present illness, otherwise negative  Temp(Src) 97.7 F (36.5 C) (Oral)  Resp 11  SpO2 98% Gen: awake, alert, NAD HEENT: anicteric, op clear CV: RRR, no mrg, PM in place Pulm: CTA b/l Abd: soft, NT/ND, +BS throughout Ext: no c/c/e Neuro: nonfocal   RELEVANT LABS AND IMAGING: CBC    Component Value Date/Time   WBC 9.6 12/16/2012 1132   WBC 10.8* 12/01/2012 0952   RBC 5.36 12/16/2012 1132   RBC 4.81 12/01/2012 0952   RBC 3.99* 07/31/2010 0713   HGB 12.1* 02/17/2013 0720   HGB 9.9* 12/01/2012 0952   HCT 38.6* 02/17/2013 0720   HCT 33.5* 12/01/2012 0952   PLT 285.0 12/16/2012 1132   MCV 66.4 Repeated and verified X2.* 12/16/2012 1132   MCV 69.7* 12/01/2012 0952   MCH 20.6* 12/01/2012 0952   MCH 21.8* 03/07/2012 0540   MCHC 30.8 12/16/2012 1132   MCHC 29.6* 12/01/2012 0952   RDW 19.5* 12/16/2012 1132   LYMPHSABS 1.1 12/16/2012 1132   MONOABS 1.1* 12/16/2012 1132   EOSABS 0.4 12/16/2012 1132   BASOSABS 0.0 12/16/2012 1132    CMP     Component Value Date/Time   NA 137 02/17/2013 0720   K 3.8 02/17/2013 0720   CL 100 02/17/2013 0720   CO2 22 02/17/2013 0720   GLUCOSE 209* 02/17/2013 0720   BUN 14 02/17/2013 0720   CREATININE 1.22 02/17/2013 0720   CREATININE 1.37* 09/20/2012 0838   CALCIUM 8.0* 02/17/2013 0720   PROT 6.9 12/03/2012 0917   ALBUMIN 3.6 12/03/2012 0917   AST 29 12/03/2012 0917   ALT 25 12/03/2012 0917   ALKPHOS 51 12/03/2012 0917   BILITOT 1.4* 12/03/2012 0917   GFRNONAA 58* 02/17/2013 0720   GFRAA 68* 02/17/2013 0720   Iron/TIBC/Ferritin    Component Value Date/Time   IRON 99 12/08/2012 1035   TIBC 462* 12/01/2012 0939   FERRITIN 11.3* 12/08/2012 1035    ASSESSMENT/PLAN: 73 yo male with PMH of CAD status post CABG, atrial fibrillation status post ablation with pacemaker  placement, CHF, diabetes, hypertension, dyslipidemia, GERD, diverticulosis with GI bleeding felt to be diverticular hemorrhage July 2012, who presents for upper endoscopy and colonoscopy for evaluation of heme-positive stools and iron deficiency anemia.  1. IDA with heme + stools -- plan is for upper and lower endoscopy today to evaluate anemia with heme-positive stool. It is encouraging that his hemoglobin has improved over the last 8 weeks with iron replacement. His hemoglobin this week was 12.1 up from  9.8 saw him in November.  The nature of the procedure, as well as the risks, benefits, and alternatives were carefully and thoroughly reviewed with the patient. Ample time for discussion and questions allowed. The patient understood, was satisfied, and agreed to proceed. If negative, we will consider video capsule endoscopy

## 2013-02-17 NOTE — Op Note (Signed)
[facility] [place], [address]   ENDOSCOPY PROCEDURE REPORT  PATIENT: Edward [name], Edward [name]  MR#: [id number] BIRTHDATE: [birth year] , 72  yrs. old GENDER: Male ENDOSCOPIST: Jerene Bears, MD PROCEDURE DATE:  02/17/2013 PROCEDURE:  EGD w/ biopsy ASA CLASS:     Class III INDICATIONS:  Iron deficiency anemia.   Heme positive stool. MEDICATIONS: MAC sedation, administered by CRNA and See Anesthesia Report. TOPICAL ANESTHETIC: none  DESCRIPTION OF PROCEDURE: After the risks benefits and alternatives of the procedure were thoroughly explained, informed consent was obtained.  The endoscope [id number] endoscope was introduced through the mouth and advanced to the second portion of the duodenum. Without limitations.  The instrument was slowly withdrawn as the mucosa was fully examined.    ESOPHAGUS: The mucosa of the esophagus appeared normal.  Slightly variable Z-line at 40 cm, but no evidence of Barrett's change.  STOMACH: Moderate erosive gastritis (inflammation) was found in the gastric antrum. There were scattered antral nodules without ulceration.  Multiple biopsies were performed using cold forceps. Normal appearing proximal stomach.  DUODENUM: The duodenal mucosa showed no abnormalities in the bulb and second portion of the duodenum.  Cold forceps biopsies were taken in the bulb and second portion.  Retroflexed views revealed no abnormalities.     The scope was then withdrawn from the patient and the procedure completed.  COMPLICATIONS: There were no complications.  ENDOSCOPIC IMPRESSION: 1.   The mucosa of the esophagus appeared normal 2.   Erosive and nodular gastritis (inflammation) was found in the gastric antrum; multiple biopsies 3.   The duodenal mucosa showed no abnormalities in the bulb and second portion of the duodenum; multiple biopsies  RECOMMENDATIONS: 1.  Await pathology results 2.  Follow-up of helicobacter pylori status, treat  if indicated 3.  Continue omeprazole for acid control  eSigned:  Jerene Bears, MD 02/17/2013 9:26 AM CC:The Patient and Lou Miner, MD

## 2013-02-17 NOTE — Discharge Instructions (Signed)
Gastrointestinal Endoscopy Care After Refer to this sheet in the next few weeks. These instructions provide you with information on caring for yourself after your procedure. Your caregiver may also give you more specific instructions. Your treatment has been planned according to current medical practices, but problems sometimes occur. Call your caregiver if you have any problems or questions after your procedure. HOME CARE INSTRUCTIONS  If you were given medicine to help you relax (sedative), do not drive, operate machinery, or sign important documents for 24 hours.  Avoid alcohol and hot or warm beverages for the first 24 hours after the procedure.  Only take over-the-counter or prescription medicines for pain, discomfort, or fever as directed by your caregiver. You may resume taking your normal medicines unless your caregiver tells you otherwise. Ask your caregiver when you may resume taking medicines that may cause bleeding, such as aspirin, clopidogrel, or warfarin.  You may return to your normal diet and activities on the day after your procedure, or as directed by your caregiver. Walking may help to reduce any bloated feeling in your abdomen.  Drink enough fluids to keep your urine clear or pale yellow.  You may gargle with salt water if you have a sore throat. SEEK IMMEDIATE MEDICAL CARE IF:  You have severe nausea or vomiting.  You have severe abdominal pain, abdominal cramps that last longer than 6 hours, or abdominal swelling (distention).  You have severe shoulder or back pain.  You have trouble swallowing.  You have shortness of breath, your breathing is shallow, or you are breathing faster than normal.  You have a fever or a rapid heartbeat.  You vomit blood or material that looks like coffee grounds.  You have bloody, black, or tarry stools. MAKE SURE YOU:  Understand these instructions.  Will watch your condition.  Will get help right away if you are not doing  well or get worse. Document Released: 08/16/2003 Document Revised: 07/03/2011 Document Reviewed: 04/03/2011 [facility] Patient Information 2014 [place], [place]. Colonoscopy Care After These instructions give you information on caring for yourself after your procedure. Your doctor may also give you more specific instructions. Call your doctor if you have any problems or questions after your procedure. HOME CARE  Take it easy for the next 24 hours.  Rest.  Walk or use warm packs on your belly (abdomen) if you have belly cramping or gas.  Do not drive for 24 hours.  You may shower.  Do not sign important papers or use machinery for 24 hours.  Drink enough fluids to keep your pee (urine) clear or pale yellow.  Resume your normal diet. Avoid heavy or fried foods.  Avoid alcohol.  Continue taking your normal medicines.  Only take medicine as told by your doctor. Do not take aspirin. If you had growths (polyps) removed:  Do not take aspirin.  Do not drink alcohol for 7 days or as told by your doctor.  Eat a soft diet for 24 hours. GET HELP RIGHT AWAY IF:  You have a fever.  You pass clumps of tissue (blood clots) or fill the toilet with blood.  You have belly pain that gets worse and medicine does not help.  Your belly is puffy (swollen).  You feel sick to your stomach (nauseous) or throw up (vomit). MAKE SURE YOU:  Understand these instructions.  Will watch your condition.  Will get help right away if you are not doing well or get worse. Document Released: 02/03/2010 Document Revised: 03/26/2011 Document Reviewed:  09/08/2012 [employer] Patient Information 2014 [place].

## 2013-02-17 NOTE — Op Note (Signed)
[facility] [place], [address]   COLONOSCOPY PROCEDURE REPORT  PATIENT: Edward [name], [place]  MR#: [id number] BIRTHDATE: September 26, 1940 , 72  yrs. old GENDER: Male ENDOSCOPIST: Jerene Bears, MD PROCEDURE DATE:  02/17/2013 PROCEDURE:   Colonoscopy with cold biopsy polypectomy First Screening Colonoscopy - Avg.  risk and is 50 yrs.  old or older - No.  Prior Negative Screening - Now for repeat screening. N/A  History of Adenoma - Now for follow-up colonoscopy & has been > or = to 3 yrs.  N/A  Polyps Removed Today? Yes. ASA CLASS:   Class III INDICATIONS:Iron Deficiency Anemia and heme-positive stool. MEDICATIONS: MAC sedation, administered by CRNA and See Anesthesia Report.  DESCRIPTION OF PROCEDURE:   After the risks benefits and alternatives of the procedure were thoroughly explained, informed consent was obtained.  A digital rectal exam revealed external hemorrhoids.   The EC-[id number] ([id number])  endoscope was introduced through the anus and advanced to the cecum, which was identified by both the appendix and ileocecal valve. No adverse events experienced.   The quality of the prep was good, using MoviPrep The instrument was then slowly withdrawn as the colon was fully examined.   COLON FINDINGS: There was moderate to severe diverticulosis noted throughout the entire examined colon (left greater than right) with associated muscular hypertrophy in the left colon.   A sessile polyp measuring 4 mm in size was found at the cecum.  A polypectomy was performed with cold forceps.  The resection was complete and the polyp tissue was completely retrieved.  No inflammation or bleeding lesions were seen. Retroflexed views revealed internal/external hemorrhoids.      The scope was withdrawn and the procedure completed. COMPLICATIONS: There were no complications.  ENDOSCOPIC IMPRESSION: 1.   There was moderate to severe diverticulosis noted throughout the entire  examined colon 2.   Sessile polyp measuring 4 mm in size was found at the cecum; polypectomy was performed with cold forceps  RECOMMENDATIONS: 1.  Await pathology results 2.  High fiber diet 3.  If the polyp removed today are proven to be adenomatous (pre-cancerous) polyps, you will need a repeat colonoscopy in 5 years.  Otherwise you should continue to follow colorectal cancer screening guidelines for "routine risk" patients with colonoscopy in 10 years.  You will receive a letter within 1-2 weeks with the results of your biopsy as well as final recommendations.  Please call my office if you have not received a letter after 3 weeks. 4.  Would proceed to video capsule endoscopy for complete views of the small bowel given history of iron deficiency anemia   eSigned:  Jerene Bears, MD 02/17/2013 9:35 AM     cc: The Patient and Lou Miner, MD

## 2013-02-18 ENCOUNTER — Other Ambulatory Visit (HOSPITAL_COMMUNITY): Payer: Federal, State, Local not specified - PPO

## 2013-02-18 ENCOUNTER — Encounter (HOSPITAL_COMMUNITY): Payer: Self-pay | Admitting: Internal Medicine

## 2013-02-18 ENCOUNTER — Ambulatory Visit: Payer: Federal, State, Local not specified - PPO | Admitting: Family

## 2013-02-18 ENCOUNTER — Encounter (HOSPITAL_COMMUNITY): Payer: Federal, State, Local not specified - PPO

## 2013-02-18 NOTE — Anesthesia Postprocedure Evaluation (Signed)
Anesthesia Post Note  Patient: Edward [name]  Procedure(s) Performed: Procedure(s) (LRB): ESOPHAGOGASTRODUODENOSCOPY (EGD) WITH PROPOFOL (N/A) BALLOON DILATION (N/A) COLONOSCOPY WITH PROPOFOL (N/A)  Anesthesia type: MAC  Patient location: PACU  Post pain: Pain level controlled  Post assessment: Post-op Vital signs reviewed  Last Vitals:  Filed Vitals:   02/17/13 1000  BP: 160/93  Temp:   Resp: 14    Post vital signs: Reviewed  Level of consciousness: sedated  Complications: No apparent anesthesia complications

## 2013-02-19 ENCOUNTER — Encounter: Payer: Self-pay | Admitting: Family

## 2013-02-20 ENCOUNTER — Ambulatory Visit (INDEPENDENT_AMBULATORY_CARE_PROVIDER_SITE_OTHER): Payer: Federal, State, Local not specified - PPO | Admitting: Family

## 2013-02-20 ENCOUNTER — Encounter: Payer: Self-pay | Admitting: Family

## 2013-02-20 ENCOUNTER — Ambulatory Visit (HOSPITAL_COMMUNITY)
Admission: RE | Admit: 2013-02-20 | Discharge: 2013-02-20 | Disposition: A | Discharge status: Home / Self Care | Payer: Federal, State, Local not specified - PPO | Source: Ambulatory Visit | Attending: Family | Admitting: Family

## 2013-02-20 ENCOUNTER — Ambulatory Visit (INDEPENDENT_AMBULATORY_CARE_PROVIDER_SITE_OTHER)
Admission: RE | Admit: 2013-02-20 | Discharge: 2013-02-20 | Disposition: A | Discharge status: Home / Self Care | Payer: Federal, State, Local not specified - PPO | Source: Ambulatory Visit | Attending: Vascular Surgery | Admitting: Vascular Surgery

## 2013-02-20 VITALS — BP 189/111 | HR 83 | Resp 16 | Ht 69.5 in | Wt 249.0 lb

## 2013-02-20 DIAGNOSIS — I739 Peripheral vascular disease, unspecified: Secondary | ICD-10-CM | POA: Insufficient documentation

## 2013-02-20 DIAGNOSIS — Z48812 Encounter for surgical aftercare following surgery on the circulatory system: Secondary | ICD-10-CM

## 2013-02-20 DIAGNOSIS — I6529 Occlusion and stenosis of unspecified carotid artery: Secondary | ICD-10-CM

## 2013-02-20 DIAGNOSIS — Z09 Encounter for follow-up examination after completed treatment for conditions other than malignant neoplasm: Secondary | ICD-10-CM | POA: Insufficient documentation

## 2013-02-20 NOTE — Progress Notes (Signed)
[facility] [place] HISTORY AND PHYSICAL -PAD   History of Present Illness Edward [name] is a 73 y.o. male patient of Dr. Scot Dock who is s/p left femoropopliteal bypass graft with vein on 03/06/2012. He is also s/p right CEA on 03/22/09. He denies claudication symptoms, denies non-healing wounds.  He had slurred speech for about a day, about 3 years ago, no further TIA or stroke symptoms.  Pt reports New Medical or Surgical History: colonoscopy and endoscopy recently  He will be seeing his cardiolgist next week, for stress test.  Pt Diabetic: Yes, under control, per pt Pt smoker: former smoker, quit in 1981  Pt meds include: Statin :Yes Betablocker: Yes ASA: Yes Other anticoagulants/antiplatelets: no  Past Medical History  Diagnosis Date  . A-fib   . HTN (hypertension)   . Dyslipidemia   . PVD (peripheral vascular disease)     s/p Right CEA March, 2011,   . Claudication   . Hyperlipidemia   . GERD (gastroesophageal reflux disease)   . Diabetic neuropathy     mostly feet/ legs  . Pacemaker   . Stroke     ?  Marland Kitchen Anemia     ?  . Diastolic CHF 3/0/0923  . Exertional shortness of breath   . Type II diabetes mellitus   . Neuromuscular disorder     neuropathy  . Coronary artery disease     Social History History  Substance Use Topics  . Smoking status: Former Smoker -- 3.00 packs/day for 32 years    Types: Cigarettes    Quit date: 01/16/1979  . Smokeless tobacco: Never Used  . Alcohol Use: 1.2 oz/week    2 Glasses of wine per week     Comment: beer 1-2 weekly    Family History Family History  Problem Relation Age of Onset  . Coronary artery disease Father   . Hypertension Father   . Heart disease Father     Heart Disease before age 35  . Heart attack Mother   . Coronary artery disease Mother   . Deep vein thrombosis Mother   . Heart disease Mother     Heart Disease before age 29  . Hyperlipidemia Mother   . Hypertension Mother    . Hypertension Sister   . Heart disease Sister     Heart Disease before age 58  . Hyperlipidemia Sister   . Anesthesia problems Neg Hx   . Heart disease Brother     Heart Disease before age 89    Past Surgical History  Procedure Laterality Date  . Carotid endarterectomy Right 03/2009  . Cholecystectomy    . Tonsillectomy and adenoidectomy    . Knee arthroscopy Left     "I had 3" (11/05/2012)  . Maze  06/14/2011    Procedure: MAZE;  Surgeon: Gaye Pollack, MD;  Location: [facility];  Service: Open Heart Surgery;  Laterality: N/A;  Ligate left atrial appendage  . Circumcision    . Femoral-popliteal bypass graft Left 03/06/2012    Procedure: BYPASS GRAFT FEMORAL-POPLITEAL ARTERY;  Surgeon: Angelia Mould, MD;  Location: [facility] OR;  Service: Vascular;  Laterality: Left;  Left Femoral - Below Knee Popliteal Bypass Graft with Vein and Intraoperative Arteriogram.  . Total knee arthroplasty Left   . Lead revision  11/05/2012    pacemaker/notes 11/05/2012  . Coronary artery bypass graft  06/14/2011    Procedure: CORONARY ARTERY BYPASS GRAFTING (CABG);  Surgeon: Gaye Pollack, MD;  Location: [facility]  OR;  Service: Open Heart Surgery;  Laterality: N/A;  Coronary Artery Bypass Graft on pump times six;  utilizing internal mammary artery and right greater saphenous vein harvested endoscopically.x 5 vessels  . Insert / replace / remove pacemaker  2013    /notes 11/05/2012-[employer]  . Joint replacement Left     left TKA  . Esophagogastroduodenoscopy (egd) with propofol N/A 02/17/2013    Procedure: ESOPHAGOGASTRODUODENOSCOPY (EGD) WITH PROPOFOL;  Surgeon: Jerene Bears, MD;  Location: [facility];  Service: Gastroenterology;  Laterality: N/A;  . Balloon dilation N/A 02/17/2013    Procedure: BALLOON DILATION;  Surgeon: Jerene Bears, MD;  Location: [facility];  Service: Gastroenterology;  Laterality: N/A;  . Colonoscopy with propofol N/A 02/17/2013    Procedure: COLONOSCOPY WITH PROPOFOL;  Surgeon: Jerene Bears, MD;  Location: [facility];  Service: Gastroenterology;  Laterality: N/A;    Allergies  Allergen Reactions  . Lipitor [Atorvastatin Calcium] Other (See Comments)    Reaction unknown    Current Outpatient Prescriptions  Medication Sig Dispense Refill  . acetaminophen (TYLENOL) 500 MG tablet Take 500 mg by mouth every 6 (six) hours as needed for mild pain or moderate pain.       Marland Kitchen aspirin 81 MG tablet Take 81 mg by mouth daily.      . diphenhydramine-acetaminophen (TYLENOL PM) 25-500 MG TABS Take 1 tablet by mouth at bedtime as needed (sleep).       . furosemide (LASIX) 40 MG tablet Take 80 mg by mouth daily.      . Iron Combinations (IRON COMPLEX PO) Take 2 capsules by mouth daily.      Marland Kitchen L-Methylfolate-Algae-B12-B6 (METANX) 3-90.314-2-35 MG CAPS daily.      Marland Kitchen loratadine (CLARITIN) 10 MG tablet Take 10 mg by mouth daily.      Marland Kitchen losartan (COZAAR) 100 MG tablet Take 100 mg by mouth at bedtime.      . magnesium gluconate (MAGONATE) 500 MG tablet Take 500 mg by mouth daily.       . metFORMIN (GLUCOPHAGE) 1000 MG tablet Take 500-1,500 mg by mouth 2 (two) times daily. Take 1 in the am and 1 1/2 in pm      . metoprolol (LOPRESSOR) 50 MG tablet Take 50 mg by mouth 2 (two) times daily.      Marland Kitchen MOVIPREP 100 G SOLR Use per prep instruction  1 kit  0  . Multiple Vitamin (MULTIVITAMIN WITH MINERALS) TABS Take 1 tablet by mouth daily.      Marland Kitchen omeprazole (PRILOSEC) 20 MG capsule daily.      Marland Kitchen oxymetazoline (AFRIN) 0.05 % nasal spray Place 2 sprays into the nose daily as needed for congestion.      . potassium chloride SA (K-DUR,KLOR-CON) 20 MEQ tablet Take 20 mEq by mouth daily.      . pravastatin (PRAVACHOL) 80 MG tablet Take 80 mg by mouth daily.      . sucralfate (CARAFATE) 1 G tablet Take 1 tablet (1 g total) by mouth 3 (three) times daily before meals.  90 tablet  2   No current facility-administered medications for this visit.    ROS: See HPI for pertinent positives and  negatives.   Physical Examination  Filed Vitals:   02/20/13 1202  BP: 189/111  Pulse: 83  Resp: 16   Filed Weights   02/20/13 1202  Weight: 249 lb (112.946 kg)   Body mass index is 36.26 kg/(m^2).   General: A&O x 3, WDWN, obese male.  Gait: normal Eyes: PERRLA, Pulmonary: CTAB, without wheezes , rales or rhonchi Cardiac: regular Rythm , without murmur          Carotid Bruits Left Right   Negative Negative  Aorta: is not palpable Radial pulses: are 2+ and =                           VASCULAR EXAM: Extremities without ischemic changes  without Gangrene; without open wounds. Both feet are warm and well-perfused. He has mild bilateral lower extremity swelling.                                                                                                           LE Pulses LEFT RIGHT       FEMORAL  not palpable  not palpable        POPLITEAL  not palpable   not palpable   Abdomen: soft, NT, no masses. Skin: no rashes, no ulcers noted. Musculoskeletal: no muscle wasting or atrophy.  Neurologic: A&O X 3; Appropriate Affect ; SENSATION: normal; MOTOR FUNCTION:  moving all extremities equally, motor strength 5/5 throughout. Speech is fluent/normal. CN 2-12 intact.   Non-Invasive Vascular Imaging: DATE: 02/20/2013  CEREBROVASCULAR DUPLEX EVALUATION    INDICATION: Carotid endarterectomy     PREVIOUS INTERVENTION(S): Right carotid endarterectomy on 03/22/09    DUPLEX EXAM:     RIGHT  LEFT  Peak Systolic Velocities (cm/s) End Diastolic Velocities (cm/s) Plaque LOCATION Peak Systolic Velocities (cm/s) End Diastolic Velocities (cm/s) Plaque  56 11  CCA PROXIMAL 61 11   73 20 HT CCA MID 120 30 HT  56 13  CCA DISTAL 89 25 HT  100 17  ECA 64 10 HT  80 34  ICA PROXIMAL 81 24 CP  90 32  ICA MID 58 21   74 25  ICA DISTAL 57 22     Not Calculated ICA / CCA Ratio (PSV) 0.91  Not Visualized  Vertebral Flow Antegrade  563 Brachial Systolic Pressure (mmHg) 875  Multiphasic  (subclavian artery) Brachial Artery Waveforms Multiphasic (subclavian artery)    Plaque Morphology:  HM = Homogeneous, HT = Heterogeneous, CP = Calcific Plaque, SP = Smooth Plaque, IP = Irregular Plaque     ADDITIONAL FINDINGS: No significant stenosis of the bilateral external or common carotid arteries.    IMPRESSION: 1. Patent right carotid endarterectomy site with no right internal carotid artery stenosis. 2. Doppler velocities suggest a less than 40% stenosis of the left proximal internal carotid artery.    Compared to the previous exam:  No significant change noted when compared to the previous exam on 02/13/12.      LOWER EXTREMITY ARTERIAL DUPLEX EVALUATION    INDICATION: Follow up bypass graft     PREVIOUS INTERVENTION(S): Left femoral to below knee popliteal artery bypass graft on 03/06/12    DUPLEX EXAM:     RIGHT  LEFT   Peak Systolic Velocity (cm/s) Ratio (if abnormal) Waveform  Peak Systolic Velocity (cm/s) Ratio (if abnormal) Waveform  Inflow Artery 87  T     Proximal Anastomosis 97  B     Proximal Graft 72  T     Mid Graft 89  T      Distal Graft 62  T     Distal Anastomosis 147 2.4 T     Outflow Artery 101  B  1.19 Today's ABI / TBI 1.29  1.16 Previous ABI / TBI (09/10/12 ) 1.19    Waveform:    M - Monophasic       B - Biphasic       T - Triphasic  If Ankle Brachial Index (ABI) or Toe Brachial Index (TBI) performed, please see complete report     ADDITIONAL FINDINGS: No internal vessel narrowing noted within the bypass graft or anastomosis. Mild focal increase in the velocity of the left distal anastomosis which appears to be due to a change in vessel diameter and angle of vessel takeoff.    IMPRESSION: Patent left leg bypass graft with no internal stenosis noted however a mildly increased velocity was obtained, as described above.    Compared to the previous exam:  No significant change noted when compared to the exam on 09/10/12.   ASSESSMENT: Edward [name] is a 73 y.o. male who is s/p left femoropopliteal bypass graft with vein on 03/06/2012 and right CEA on 02/22/09. He has no claudication symptoms, his left leg bypass graft is patent with no stenosis, his ABI's are normal. He had a TIA about 3 years ago, no further TIA or stroke activity since then. His right CEA is patent and the left ICA has minimal stenosis.  PLAN:  I discussed in depth with the patient the nature of atherosclerosis, and emphasized the importance of maximal medical management including strict control of blood pressure, blood glucose, and lipid levels, obtaining regular exercise, and continued cessation of smoking.  The patient is aware that without maximal medical management the underlying atherosclerotic disease process will progress, limiting the benefit of any interventions.  Based on the patient's vascular studies and examination, pt will return to clinic in 6 months for, ABI's, and LLE arterial Duplex, 1 year for carotid Duplex.  The patient was given information about PAD including signs, symptoms, treatment, what symptoms should prompt the patient to seek immediate medical care, and risk reduction measures to take.  Clemon Chambers, RN, MSN, FNP-C [facility] Phone: [phone]  Clinic MD: Bridgett Larsson  02/20/2013 12:17 PM

## 2013-02-20 NOTE — Patient Instructions (Addendum)
Peripheral Vascular Disease Peripheral Vascular Disease (PVD), also called Peripheral Arterial Disease (PAD), is a circulation problem caused by cholesterol (atherosclerotic plaque) deposits in the arteries. PVD commonly occurs in the lower extremities (legs) but it can occur in other areas of the body, such as your arms. The cholesterol buildup in the arteries reduces blood flow which can cause pain and other serious problems. The presence of PVD can place a person at risk for Coronary Artery Disease (CAD).  CAUSES  Causes of PVD can be many. It is usually associated with more than one risk factor such as:   High Cholesterol.  Smoking.  Diabetes.  Lack of exercise or inactivity.  High blood pressure (hypertension).  Obesity.  Family history. SYMPTOMS   When the lower extremities are affected, patients with PVD may experience:  Leg pain with exertion or physical activity. This is called INTERMITTENT CLAUDICATION. This may present as cramping or numbness with physical activity. The location of the pain is associated with the level of blockage. For example, blockage at the abdominal level (distal abdominal aorta) may result in buttock or hip pain. Lower leg arterial blockage may result in calf pain.  As PVD becomes more severe, pain can develop with less physical activity.  In people with severe PVD, leg pain may occur at rest.  Other PVD signs and symptoms:  Leg numbness or weakness.  Coldness in the affected leg or foot, especially when compared to the other leg.  A change in leg color.  Patients with significant PVD are more prone to ulcers or sores on toes, feet or legs. These may take longer to heal or may reoccur. The ulcers or sores can become infected.  If signs and symptoms of PVD are ignored, gangrene may occur. This can result in the loss of toes or loss of an entire limb.  Not all leg pain is related to PVD. Other medical conditions can cause leg pain such  as:  Blood clots (embolism) or Deep Vein Thrombosis.  Inflammation of the blood vessels (vasculitis).  Spinal stenosis. DIAGNOSIS  Diagnosis of PVD can involve several different types of tests. These can include:  Pulse Volume Recording Method (PVR). This test is simple, painless and does not involve the use of X-rays. PVR involves measuring and comparing the blood pressure in the arms and legs. An ABI (Ankle-Brachial Index) is calculated. The normal ratio of blood pressures is 1. As this number becomes smaller, it indicates more severe disease.  < 0.95  indicates significant narrowing in one or more leg vessels.  <0.8 there will usually be pain in the foot, leg or buttock with exercise.  <0.4 will usually have pain in the legs at rest.  <0.25  usually indicates limb threatening PVD.  Doppler detection of pulses in the legs. This test is painless and checks to see if you have a pulses in your legs/feet.  A dye or contrast material (a substance that highlights the blood vessels so they show up on x-ray) may be given to help your caregiver better see the arteries for the following tests. The dye is eliminated from your body by the kidney's. Your caregiver may order blood work to check your kidney function and other laboratory values before the following tests are performed:  Magnetic Resonance Angiography (MRA). An MRA is a picture study of the blood vessels and arteries. The MRA machine uses a large magnet to produce images of the blood vessels.  Computed Tomography Angiography (CTA). A CTA is a  specialized x-ray that looks at how the blood flows in your blood vessels. An IV may be inserted into your arm so contrast dye can be injected.  Angiogram. Is a procedure that uses x-rays to look at your blood vessels. This procedure is minimally invasive, meaning a small incision (cut) is made in your groin. A small tube (catheter) is then inserted into the artery of your groin. The catheter is  guided to the blood vessel or artery your caregiver wants to examine. Contrast dye is injected into the catheter. X-rays are then taken of the blood vessel or artery. After the images are obtained, the catheter is taken out. TREATMENT  Treatment of PVD involves many interventions which may include:  Lifestyle changes:  Quitting smoking.  Exercise.  Following a low fat, low cholesterol diet.  Control of diabetes.  Foot care is very important to the PVD patient. Good foot care can help prevent infection.  Medication:  Cholesterol-lowering medicine.  Blood pressure medicine.  Anti-platelet drugs.  Certain medicines may reduce symptoms of Intermittent Claudication.  Interventional/Surgical options:  Angioplasty. An Angioplasty is a procedure that inflates a balloon in the blocked artery. This opens the blocked artery to improve blood flow.  Stent Implant. A wire mesh tube (stent) is placed in the artery. The stent expands and stays in place, allowing the artery to remain open.  Peripheral Bypass Surgery. This is a surgical procedure that reroutes the blood around a blocked artery to help improve blood flow. This type of procedure may be performed if Angioplasty or stent implants are not an option. SEEK IMMEDIATE MEDICAL CARE IF:   You develop pain or numbness in your arms or legs.  Your arm or leg turns cold, becomes blue in color.  You develop redness, warmth, swelling and pain in your arms or legs. MAKE SURE YOU:   Understand these instructions.  Will watch your condition.  Will get help right away if you are not doing well or get worse. Document Released: 02/09/2004 Document Revised: 03/26/2011 Document Reviewed: 01/06/2008 [facility] Patient Information 2014 [place], [place].   Stroke Prevention Some medical conditions and behaviors are associated with an increased chance of having a stroke. You may prevent a stroke by making healthy choices and managing medical  conditions. HOW CAN I REDUCE MY RISK OF HAVING A STROKE?   Stay physically active. Get at least 30 minutes of activity on most or all days.  Do not smoke. It may also be helpful to avoid exposure to secondhand smoke.  Limit alcohol use. Moderate alcohol use is considered to be:  No more than 2 drinks per day for men.  No more than 1 drink per day for nonpregnant women.  Eat healthy foods. This involves  Eating 5 or more servings of fruits and vegetables a day.  Following a diet that addresses high blood pressure (hypertension), high cholesterol, diabetes, or obesity.  Manage your cholesterol levels.  A diet low in saturated fat, trans fat, and cholesterol and high in fiber may control cholesterol levels.  Take any prescribed medicines to control cholesterol as directed by your health care provider.  Manage your diabetes.  A controlled-carbohydrate, controlled-sugar diet is recommended to manage diabetes.  Take any prescribed medicines to control diabetes as directed by your health care provider.  Control your hypertension.  A low-salt (sodium), low-saturated fat, low-trans fat, and low-cholesterol diet is recommended to manage hypertension.  Take any prescribed medicines to control hypertension as directed by your health care provider.  Maintain a healthy weight.  A reduced-calorie, low-sodium, low-saturated fat, low-trans fat, low-cholesterol diet is recommended to manage weight.  Stop drug abuse.  Avoid taking birth control pills.  Talk to your health care provider about the risks of taking birth control pills if you are over 46 years old, smoke, get migraines, or have ever had a blood clot.  Get evaluated for sleep disorders (sleep apnea).  Talk to your health care provider about getting a sleep evaluation if you snore a lot or have excessive sleepiness.  Take medicines as directed by your health care provider.  For some people, aspirin or blood thinners  (anticoagulants) are helpful in reducing the risk of forming abnormal blood clots that can lead to stroke. If you have the irregular heart rhythm of atrial fibrillation, you should be on a blood thinner unless there is a good reason you cannot take them.  Understand all your medicine instructions.  Make sure that other other conditions (such as anemia or atherosclerosis) are addressed. SEEK IMMEDIATE MEDICAL CARE IF:   You have sudden weakness or numbness of the face, arm, or leg, especially on one side of the body.  Your face or eyelid droops to one side.  You have sudden confusion.  You have trouble speaking (aphasia) or understanding.  You have sudden trouble seeing in one or both eyes.  You have sudden trouble walking.  You have dizziness.  You have a loss of balance or coordination.  You have a sudden, severe headache with no known cause.  You have new chest pain or an irregular heartbeat. Any of these symptoms may represent a serious problem that is an emergency. Do not wait to see if the symptoms will go away. Get medical help at once. Call your local emergency services  (911 in [place] Do not drive yourself to the hospital. Document Released: 02/09/2004 Document Revised: 10/22/2012 Document Reviewed: 07/04/2012 [facility] Patient Information 2014 [place].

## 2013-02-22 ENCOUNTER — Encounter: Payer: Self-pay | Admitting: Internal Medicine

## 2013-02-24 ENCOUNTER — Encounter: Payer: Self-pay | Admitting: Internal Medicine

## 2013-02-24 ENCOUNTER — Ambulatory Visit (HOSPITAL_COMMUNITY): Payer: Federal, State, Local not specified - PPO | Attending: Internal Medicine | Admitting: Radiology

## 2013-02-24 DIAGNOSIS — Z951 Presence of aortocoronary bypass graft: Secondary | ICD-10-CM

## 2013-02-24 DIAGNOSIS — E785 Hyperlipidemia, unspecified: Secondary | ICD-10-CM

## 2013-02-24 DIAGNOSIS — I10 Essential (primary) hypertension: Secondary | ICD-10-CM

## 2013-02-24 DIAGNOSIS — I2510 Atherosclerotic heart disease of native coronary artery without angina pectoris: Secondary | ICD-10-CM

## 2013-02-24 DIAGNOSIS — R0989 Other specified symptoms and signs involving the circulatory and respiratory systems: Secondary | ICD-10-CM

## 2013-02-24 DIAGNOSIS — Z0000 Encounter for general adult medical examination without abnormal findings: Secondary | ICD-10-CM

## 2013-02-24 MED ORDER — TECHNETIUM TC 99M SESTAMIBI GENERIC - CARDIOLITE
33.0000 | Freq: Once | INTRAVENOUS | Status: AC | PRN
  Administered 2013-02-24: 33 via INTRAVENOUS

## 2013-02-25 ENCOUNTER — Ambulatory Visit (HOSPITAL_COMMUNITY): Payer: Federal, State, Local not specified - PPO | Attending: Cardiology | Admitting: Radiology

## 2013-02-25 VITALS — BP 141/105 | HR 82 | Ht 70.0 in | Wt 248.0 lb

## 2013-02-25 DIAGNOSIS — Z8673 Personal history of transient ischemic attack (TIA), and cerebral infarction without residual deficits: Secondary | ICD-10-CM | POA: Insufficient documentation

## 2013-02-25 DIAGNOSIS — E785 Hyperlipidemia, unspecified: Secondary | ICD-10-CM | POA: Insufficient documentation

## 2013-02-25 DIAGNOSIS — I2510 Atherosclerotic heart disease of native coronary artery without angina pectoris: Secondary | ICD-10-CM

## 2013-02-25 DIAGNOSIS — I447 Left bundle-branch block, unspecified: Secondary | ICD-10-CM | POA: Insufficient documentation

## 2013-02-25 DIAGNOSIS — R0609 Other forms of dyspnea: Secondary | ICD-10-CM | POA: Insufficient documentation

## 2013-02-25 DIAGNOSIS — I739 Peripheral vascular disease, unspecified: Secondary | ICD-10-CM | POA: Insufficient documentation

## 2013-02-25 DIAGNOSIS — I779 Disorder of arteries and arterioles, unspecified: Secondary | ICD-10-CM | POA: Insufficient documentation

## 2013-02-25 DIAGNOSIS — I4891 Unspecified atrial fibrillation: Secondary | ICD-10-CM | POA: Insufficient documentation

## 2013-02-25 DIAGNOSIS — Z87891 Personal history of nicotine dependence: Secondary | ICD-10-CM | POA: Insufficient documentation

## 2013-02-25 DIAGNOSIS — E119 Type 2 diabetes mellitus without complications: Secondary | ICD-10-CM | POA: Insufficient documentation

## 2013-02-25 DIAGNOSIS — I10 Essential (primary) hypertension: Secondary | ICD-10-CM | POA: Insufficient documentation

## 2013-02-25 DIAGNOSIS — R0989 Other specified symptoms and signs involving the circulatory and respiratory systems: Principal | ICD-10-CM | POA: Insufficient documentation

## 2013-02-25 DIAGNOSIS — Z8269 Family history of other diseases of the musculoskeletal system and connective tissue: Secondary | ICD-10-CM | POA: Insufficient documentation

## 2013-02-25 DIAGNOSIS — R0602 Shortness of breath: Secondary | ICD-10-CM

## 2013-02-25 MED ORDER — ADENOSINE (DIAGNOSTIC) 3 MG/ML IV SOLN
0.5600 mg/kg | Freq: Once | INTRAVENOUS | Status: AC
  Administered 2013-02-25: 60 mg via INTRAVENOUS

## 2013-02-25 MED ORDER — TECHNETIUM TC 99M SESTAMIBI GENERIC - CARDIOLITE
30.0000 | Freq: Once | INTRAVENOUS | Status: AC | PRN
  Administered 2013-02-25: 30 via INTRAVENOUS

## 2013-02-25 NOTE — Progress Notes (Signed)
[facility] [address] [place], [place] [address] [phone]    Cardiology Nuclear Med Study  JW COVIN is a 73 y.o. male     MRN : [id number]     DOB: 1940-03-26  Procedure Date: 02/25/2013  Nuclear Med Background Indication for Stress Test:  Evaluation for Ischemia, and FAA Clearance History:  CAD, PTVP, Afib s/p MAZE, Echo 2014 60-65% MPI 2014 (normal, scar) EF 59% Cardiac Risk Factors: Carotid Disease, Family History - CAD, History of Smoking, Hypertension, LBBB, Lipids, NIDDM, PVD and TIA  Symptoms:  DOE   Nuclear Pre-Procedure Caffeine/Decaff Intake:  None > 12 hrs NPO After: 10:00pm   Lungs:  clear O2 Sat: 98% on room air. IV 0.9% NS with Angio Cath:  22g  IV Site: R Antecubital x 1, tolerated well IV Started by:  Irven Baltimore, RN  Chest Size (in):  50 Cup Size: n/a  Height: 5' 10"  (1.778 m)  Weight:  248 lb (112.492 kg)  BMI:  Body mass index is 35.58 kg/(m^2). Tech Comments:  Took Lopressor and Metformin this am.    Nuclear Med Study 1 or 2 day study: 2 day  Stress Test Type:  Adenosine  Reading MD: N/A  Order Authorizing Provider:  Darlin Coco, MD  Resting Radionuclide: Technetium 38mSestamibi  Resting Radionuclide Dose: 33.0 mCi  02/24/13  Stress Radionuclide:  Technetium 954mestamibi  Stress Radionuclide Dose: 33.0 mCi   02/25/13          Stress Protocol Rest HR: 82 Stress HR: 83  Rest BP: 141/105 Stress BP: 197/115  Exercise Time (min): n/a METS: n/a           Dose of Adenosine (mg):  60 mg Dose of Lexiscan: n/a mg  Dose of Atropine (mg): n/a Dose of Dobutamine: n/a mcg/kg/min (at max HR)  Stress Test Technologist: ShGlade LloydBS-ES  Nuclear Technologist:  ToAnnye RuskCNMT     Rest Procedure:  Myocardial perfusion imaging was performed at rest 45 minutes following the intravenous administration of Technetium 9946mstamibi. Rest ECG: Paced rhythm  Stress Procedure:  The patient received IV adenosine  at 140 mcg/kg/min for 4 minutes.  Technetium 64m39mtamibi was injected at the 2 minute mark and quantitative spect images were obtained after a 45 minute delay.  During the infusion of Adenosine, the patient complained of a slight headache.  This resolved in recovery.  Stress ECG: No significant change from baseline ECG  QPS Raw Data Images:  Normal; no motion artifact; normal heart/lung ratio. Stress Images:  Small/medium area of moderate/severe decreased uptake at the apical anterior segment, apical cap, and the apical inferior segment. These defects are fixed Rest Images:  Rest images are the same as stress. Subtraction (SDS):  No evidence of ischemia. Transient Ischemic Dilatation (Normal <1.22):  1.02 Lung/Heart Ratio (Normal <0.45):  0.44  Quantitative Gated Spect Images QGS EDV:  118 ml QGS ESV:  56 ml  Impression Exercise Capacity:  Adenosine study with no exercise. BP Response:  Patient was hypertensive at rest. Blood pressure remained in a similar range throughout the study. Clinical Symptoms:  Slight headache ECG Impression:  No significant ST segment change suggestive of ischemia. The rhythm was paced Comparison with Prior Nuclear Study: I compare this study with report of the study done January, 2014  Overall Impression:  This is a low risk scan with no significant change since January, 2014. There is a small/moderate area of scar at  the apex. This has been noted in the past. There is no ischemia.  LV Ejection Fraction: 53%.  LV Wall Motion:  Mild hypokinesis at the apex.  Dola Argyle , MD

## 2013-02-26 ENCOUNTER — Encounter: Payer: Self-pay | Admitting: Cardiovascular Disease

## 2013-02-26 ENCOUNTER — Ambulatory Visit (INDEPENDENT_AMBULATORY_CARE_PROVIDER_SITE_OTHER): Payer: Federal, State, Local not specified - PPO | Admitting: Cardiovascular Disease

## 2013-02-26 ENCOUNTER — Telehealth: Payer: Self-pay | Admitting: *Deleted

## 2013-02-26 ENCOUNTER — Ambulatory Visit: Payer: Federal, State, Local not specified - PPO | Admitting: Cardiovascular Disease

## 2013-02-26 VITALS — BP 166/98 | HR 84 | Ht 70.0 in | Wt 246.8 lb

## 2013-02-26 DIAGNOSIS — I4891 Unspecified atrial fibrillation: Secondary | ICD-10-CM

## 2013-02-26 DIAGNOSIS — I2510 Atherosclerotic heart disease of native coronary artery without angina pectoris: Secondary | ICD-10-CM

## 2013-02-26 MED ORDER — CARVEDILOL 25 MG PO TABS: 25.0000 mg | ORAL_TABLET | Freq: Two times a day (BID) | ORAL | Status: DC

## 2013-02-26 NOTE — Telephone Encounter (Signed)
lmom for pt to call back

## 2013-02-26 NOTE — Progress Notes (Signed)
Edward [name] Date of Birth  [birth year] [place]  [address]    [place]   [place], [place]  [address]    [place], [place]  [address] [phone]  Fax  [phone]  [phone]  Fax [phone]  Problem list:  1. Atrial fibrillation- - s/p MAZE procedure  - no AF recently 2. Hypertension 3. Diabetes mellitus 4. Dyslipidemia 5. Peripheral vascular disease:-Status post right carotid endarterectomy and status post PTA by Dr. Sherren Mocha 6. History of GI bleed-currently not on antiplatelet agents or anticoagulants-,  he was hospitalized July 30, 2011 for GI bleed.  Was on Xarelto, ASA, plavix.  Xarelto and plavx were held. 7. Anemia 8. History of TIA 9. Pacer 10 CABG - MAZE - Jun 12, 2011  History of Present Illness:  Edward [name]  Is a 73 y.o. gentleman with a history as noted above. He had coronary artery bypass grafting in May of this year. He also had a Maze procedure.  April 23, 2012: Edward [name] presents today after seeing Dr. wall in the office several weeks ago. He had vascular surgery on his left leg. Since that time he's had significant edema. There is no significant edema on the right leg. He's tried elevating his leg but has had minimal success. His Lasix was increased.  He has been able to diurese about 5 pounds since that time.    He's had some shortness of breath climbing stairs. He's not any chest pains. He did not sleep well last night. This may explain his elevated blood pressure.    Jun 04, 2012: Edward [name] was seen  In April for leg edema.  He has some infection associated with his vascular surgery leg would.  He saw Dr Scot Dock and his wound was debrided.   Echo revealed LV function.    He has done better on the Lasix 80 mg a day.  We have tried Lasix 40 mg a day but his dyspnea is worse.  He had CABG in May 2013.    Nov. 19, 2014; Edward [name] is very short of breath today.  He had a pacer revision ( revised atrial  lead) by Dr. Caryl Comes in Oct.  He has not noticed any improvement after the pacer revision.  He had GI bleeding and was seen by Dr. Birdena Jubilee.  He has not seen a GI doctor.     His breathing is much worse over the past several months.    He stopped his apirin this week.   Feb. 12, 2015: He has had some GI  bleeding .  Hb dropped.   EGD and colonoscopy did not show anything specific.  He had a Lexiscan myoview this week which was low risk for ischemia. His left ventricular systolic function is normal with an EF of 53%.   His EKG from 02/16/2013 reveals atrial /  ventricular pacing.       Current Outpatient Prescriptions on File Prior to Visit  Medication Sig Dispense Refill  . acetaminophen (TYLENOL) 500 MG tablet Take 500 mg by mouth every 6 (six) hours as needed for mild pain or moderate pain.       Marland Kitchen aspirin 81 MG tablet Take 81 mg by mouth daily.      . diphenhydramine-acetaminophen (TYLENOL PM) 25-500 MG TABS Take 1 tablet by mouth at bedtime as needed (sleep).       . furosemide (LASIX) 40 MG tablet Take 80 mg by  mouth daily.      . Iron Combinations (IRON COMPLEX PO) Take 2 capsules by mouth daily.      Marland Kitchen L-Methylfolate-Algae-B12-B6 (METANX) 3-90.314-2-35 MG CAPS daily.      Marland Kitchen loratadine (CLARITIN) 10 MG tablet Take 10 mg by mouth daily.      Marland Kitchen losartan (COZAAR) 100 MG tablet Take 100 mg by mouth at bedtime.      . magnesium gluconate (MAGONATE) 500 MG tablet Take 500 mg by mouth daily.       . metFORMIN (GLUCOPHAGE) 1000 MG tablet Take 500-1,500 mg by mouth 2 (two) times daily. Take 1 in the am and 1 1/2 in pm      . metoprolol (LOPRESSOR) 50 MG tablet Take 50 mg by mouth 2 (two) times daily.      Marland Kitchen MOVIPREP 100 G SOLR Use per prep instruction  1 kit  0  . Multiple Vitamin (MULTIVITAMIN WITH MINERALS) TABS Take 1 tablet by mouth daily.      Marland Kitchen omeprazole (PRILOSEC) 20 MG capsule daily.      Marland Kitchen oxymetazoline (AFRIN) 0.05 % nasal spray Place 2 sprays into the nose daily as needed for  congestion.      . potassium chloride SA (K-DUR,KLOR-CON) 20 MEQ tablet Take 20 mEq by mouth daily.      . pravastatin (PRAVACHOL) 80 MG tablet Take 80 mg by mouth daily.      . sucralfate (CARAFATE) 1 G tablet Take 1 tablet (1 g total) by mouth 3 (three) times daily before meals.  90 tablet  2   No current facility-administered medications on file prior to visit.    Allergies  Allergen Reactions  . Lipitor [Atorvastatin Calcium] Other (See Comments)    Reaction unknown    Past Medical History  Diagnosis Date  . A-fib   . HTN (hypertension)   . Dyslipidemia   . PVD (peripheral vascular disease)     s/p Right CEA March, 2011,   . Claudication   . Hyperlipidemia   . GERD (gastroesophageal reflux disease)   . Diabetic neuropathy     mostly feet/ legs  . Pacemaker   . Stroke     ?  Marland Kitchen Anemia     ?  . Diastolic CHF 05/18/6501  . Exertional shortness of breath   . Type II diabetes mellitus   . Neuromuscular disorder     neuropathy  . Coronary artery disease     Past Surgical History  Procedure Laterality Date  . Carotid endarterectomy Right 03/2009  . Cholecystectomy    . Tonsillectomy and adenoidectomy    . Knee arthroscopy Left     "I had 3" (11/05/2012)  . Maze  06/14/2011    Procedure: MAZE;  Surgeon: Gaye Pollack, MD;  Location: [facility];  Service: Open Heart Surgery;  Laterality: N/A;  Ligate left atrial appendage  . Circumcision    . Femoral-popliteal bypass graft Left 03/06/2012    Procedure: BYPASS GRAFT FEMORAL-POPLITEAL ARTERY;  Surgeon: Angelia Mould, MD;  Location: [facility];  Service: Vascular;  Laterality: Left;  Left Femoral - Below Knee Popliteal Bypass Graft with Vein and Intraoperative Arteriogram.  . Total knee arthroplasty Left   . Lead revision  11/05/2012    pacemaker/notes 11/05/2012  . Coronary artery bypass graft  06/14/2011    Procedure: CORONARY ARTERY BYPASS GRAFTING (CABG);  Surgeon: Gaye Pollack, MD;  Location: [facility];  Service: Open Heart  Surgery;  Laterality: N/A;  Coronary Artery  Bypass Graft on pump times six;  utilizing internal mammary artery and right greater saphenous vein harvested endoscopically.x 5 vessels  . Insert / replace / remove pacemaker  2013    /notes 11/05/2012-[employer]  . Joint replacement Left     left TKA  . Esophagogastroduodenoscopy (egd) with propofol N/A 02/17/2013    Procedure: ESOPHAGOGASTRODUODENOSCOPY (EGD) WITH PROPOFOL;  Surgeon: Jerene Bears, MD;  Location: [facility];  Service: Gastroenterology;  Laterality: N/A;  . Balloon dilation N/A 02/17/2013    Procedure: BALLOON DILATION;  Surgeon: Jerene Bears, MD;  Location: [facility];  Service: Gastroenterology;  Laterality: N/A;  . Colonoscopy with propofol N/A 02/17/2013    Procedure: COLONOSCOPY WITH PROPOFOL;  Surgeon: Jerene Bears, MD;  Location: [facility];  Service: Gastroenterology;  Laterality: N/A;    History  Smoking status  . Former Smoker -- 3.00 packs/day for 32 years  . Types: Cigarettes  . Quit date: 01/16/1979  Smokeless tobacco  . Never Used    History  Alcohol Use  . 1.2 oz/week  . 2 Glasses of wine per week    Comment: beer 1-2 weekly    Family History  Problem Relation Age of Onset  . Coronary artery disease Father   . Hypertension Father   . Heart disease Father     Heart Disease before age 68  . Heart attack Mother   . Coronary artery disease Mother   . Deep vein thrombosis Mother   . Heart disease Mother     Heart Disease before age 24  . Hyperlipidemia Mother   . Hypertension Mother   . Hypertension Sister   . Heart disease Sister     Heart Disease before age 15  . Hyperlipidemia Sister   . Anesthesia problems Neg Hx   . Heart disease Brother     Heart Disease before age 79    Reviw of Systems:  Reviewed in the HPI.  All other systems are negative.  Physical Exam: BP 166/98  Pulse 84  Ht 5' 10" (1.778 m)  Wt 246 lb 12.8 oz (111.948 kg)  BMI 35.41 kg/m2 The patient is alert and oriented x  3.  The mood and affect are normal.   Skin: warm and dry.      HEENT:   Normocephalic/atraumatic. He has a right carotid endarterectomy scar. His carotids are 2+. No JVD. His neck is supple. The membranes are moist.  Lungs: Lungs are clear   Heart: Regular rate. No murmurs.  His sternotomy scar is healing well. His thoracostomy sites are healing well.  Abdomen: Good bowel sounds. , NT   Extremities:  Bilateral trace edema  Neuro:  Neuro exam is nonfocal.    ECG: Feb. 2, 2015:  AV pacing.    Assessment / Plan:

## 2013-02-26 NOTE — Assessment & Plan Note (Addendum)
He's not had any further episodes of chest discomfort. We'll continue standard risk factor modification.  We'll check fasting lipids at his next office visit.  His last Myoview study was low risk.  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

## 2013-02-26 NOTE — Patient Instructions (Signed)
Your physician wants you to follow-up in: [place] will receive a reminder letter in the mail two months in advance. If you don't receive a letter, please call our office to schedule the follow-up appointment.   STOP METOPROLOL  START CARVEDILOL 25 MG TWICE DAILY

## 2013-02-26 NOTE — Assessment & Plan Note (Signed)
He has Remained stable. He is still in sinus rhythm. His last EKG shows atrial pacing.

## 2013-02-26 NOTE — Telephone Encounter (Signed)
Okay, I do think capsule is indicated to rule out small bowel source of iron def. Anemia.   Apparently this has to be done in the hospital and the pt will stay for 12 hours and be monitored on tele.  He can leave after the study period. Please make sure he is aware of the hospital observation and all that is involved.  If he is agreeable, given the change in what he was expecting, then can proceed to VCE

## 2013-02-26 NOTE — Telephone Encounter (Signed)
Called wife to schedule the Capsule and pt has a Pacemaker. We have to do these at the hospital with the pt monitored by Telemetry. I did not discuss this with the wife or pt; OK to continue? Thanks.

## 2013-02-26 NOTE — Telephone Encounter (Signed)
Spoke with pt who did not like the idea of being in a hospital , but he did say he would consider the procedure if his insurance will pay for it. Pt only has Medicare Part A and he has [employer] for the rest. I will check on insurance and call him back. Wife stated understanding; pt hung up.

## 2013-02-27 NOTE — Telephone Encounter (Signed)
lmom for pt to call back. Left message stating if he doesn't call back today, call Barb Merino, RN

## 2013-02-27 NOTE — Telephone Encounter (Signed)
Sheri, I am placing this on your desk. I called  [employer] who stated Medicare who be primary payer, but pt will tell you [employer] should pay 1st/ he only has Medicare Part A. Thanks.

## 2013-03-01 ENCOUNTER — Other Ambulatory Visit: Payer: Self-pay | Admitting: Cardiovascular Disease

## 2013-03-02 ENCOUNTER — Encounter: Payer: Self-pay | Admitting: Internal Medicine

## 2013-03-02 NOTE — Telephone Encounter (Signed)
I have called and asked the patient for a copy of his medicare card to do further research on scheduling the capsule endoscopy at [facility].  Patient states he is sure that it won't be covered and won't provide a copy of the card.  He is not willing to incur any expense "for an unnecessary procedure in my opinion".  He states "I'll pass".

## 2013-03-03 ENCOUNTER — Other Ambulatory Visit: Payer: Self-pay | Admitting: Cardiovascular Disease

## 2013-03-04 NOTE — Telephone Encounter (Signed)
As long as he is aware of my recommendation for VCE given his iron def. Anemia, then he makes his own decisions regarding his medical care. He can call back to schedule VCE if he changes his mind

## 2013-03-05 ENCOUNTER — Encounter: Payer: Self-pay | Admitting: Cardiovascular Disease

## 2013-03-05 ENCOUNTER — Other Ambulatory Visit: Payer: Self-pay | Admitting: Cardiovascular Disease

## 2013-03-06 ENCOUNTER — Telehealth: Payer: Self-pay | Admitting: *Deleted

## 2013-03-06 NOTE — Telephone Encounter (Signed)
Left pt message that his wife has an appointment with Dr. Acie Fredrickson 03/12/13 at [place]

## 2013-03-10 ENCOUNTER — Ambulatory Visit: Payer: Federal, State, Local not specified - PPO | Admitting: Cardiovascular Disease

## 2013-03-27 ENCOUNTER — Telehealth: Payer: Self-pay

## 2013-03-27 MED ORDER — METFORMIN HCL 1000 MG PO TABS: ORAL_TABLET | ORAL | Status: DC

## 2013-03-27 NOTE — Telephone Encounter (Signed)
Rx printed and signed, faxed to pt.

## 2013-03-27 NOTE — Telephone Encounter (Signed)
Patient wife called and says she needs our office to fax her husband's metformin prescription to her so she can fax it to the [place]. Says she sent them the wrong dosage when she faxed it. Says the correct dosage is 106m in the am, and 15029min the pm. Her fax number is [phone]

## 2013-04-08 ENCOUNTER — Telehealth: Payer: Self-pay | Admitting: Cardiovascular Disease

## 2013-04-08 NOTE — Telephone Encounter (Signed)
Walk in pt Form " Sealed Envelope" gave to [facility]

## 2013-04-11 ENCOUNTER — Other Ambulatory Visit: Payer: Self-pay | Admitting: Cardiovascular Disease

## 2013-05-13 ENCOUNTER — Other Ambulatory Visit: Payer: Self-pay

## 2013-05-13 DIAGNOSIS — E785 Hyperlipidemia, unspecified: Secondary | ICD-10-CM

## 2013-05-13 MED ORDER — PRAVASTATIN SODIUM 80 MG PO TABS: ORAL_TABLET | ORAL | Status: DC

## 2013-05-13 MED ORDER — LOSARTAN POTASSIUM 100 MG PO TABS: ORAL_TABLET | ORAL | Status: DC

## 2013-05-13 MED ORDER — POTASSIUM CHLORIDE CRYS ER 20 MEQ PO TBCR: EXTENDED_RELEASE_TABLET | ORAL | Status: DC

## 2013-05-14 ENCOUNTER — Other Ambulatory Visit: Payer: Self-pay | Admitting: Gastroenterology

## 2013-05-14 MED ORDER — SUCRALFATE 1 G PO TABS: 1.0000 g | ORAL_TABLET | Freq: Three times a day (TID) | ORAL | Status: DC

## 2013-05-26 ENCOUNTER — Telehealth: Payer: Self-pay | Admitting: Cardiovascular Disease

## 2013-05-26 NOTE — Telephone Encounter (Signed)
Walk in pt Form " Sealed Envelope" Will give to [facility] Friday

## 2013-05-29 ENCOUNTER — Ambulatory Visit (INDEPENDENT_AMBULATORY_CARE_PROVIDER_SITE_OTHER): Payer: Federal, State, Local not specified - PPO | Admitting: Internal Medicine

## 2013-05-29 VITALS — BP 142/88 | HR 82 | Temp 98.0°F | Resp 17 | Ht 68.5 in | Wt 242.0 lb

## 2013-05-29 DIAGNOSIS — Z79899 Other long term (current) drug therapy: Secondary | ICD-10-CM

## 2013-05-29 DIAGNOSIS — Z9889 Other specified postprocedural states: Secondary | ICD-10-CM

## 2013-05-29 DIAGNOSIS — Z8679 Personal history of other diseases of the circulatory system: Secondary | ICD-10-CM

## 2013-05-29 DIAGNOSIS — I4891 Unspecified atrial fibrillation: Secondary | ICD-10-CM

## 2013-05-29 DIAGNOSIS — E119 Type 2 diabetes mellitus without complications: Secondary | ICD-10-CM

## 2013-05-29 DIAGNOSIS — I495 Sick sinus syndrome: Secondary | ICD-10-CM

## 2013-05-29 DIAGNOSIS — Z950 Presence of cardiac pacemaker: Secondary | ICD-10-CM

## 2013-05-29 DIAGNOSIS — I498 Other specified cardiac arrhythmias: Secondary | ICD-10-CM

## 2013-05-29 DIAGNOSIS — I5030 Unspecified diastolic (congestive) heart failure: Secondary | ICD-10-CM

## 2013-05-29 LAB — POCT GLYCOSYLATED HEMOGLOBIN (HGB A1C): Hemoglobin A1C: 11

## 2013-05-29 LAB — GLUCOSE, POCT (MANUAL RESULT ENTRY): POC Glucose: 353 mg/dl — AB (ref 70–99)

## 2013-05-29 MED ORDER — METANX 3-90.314-2-35 MG PO CAPS: 1.0000 | ORAL_CAPSULE | Freq: Every day | ORAL | Status: DC

## 2013-05-29 MED ORDER — FUROSEMIDE 40 MG PO TABS: 80.0000 mg | ORAL_TABLET | Freq: Every day | ORAL | Status: DC

## 2013-05-29 NOTE — Progress Notes (Signed)
   Subjective:    Patient ID: Edward [name], male    DOB: Aug 14, 1940, 73 y.o.   MRN: [id number]  HPI Pt is requesting medication refill for Lasix and Metanx.   Pt would also like to have a form filled out for the [facility] regarding his Diabetes. He also needs an A1c done for this form. Pt states he doesn't take his blood sugar at home, he states it seems to be under control and he can tell when it is elevated.   Review of Systems     Objective:   Physical Exam   Results for orders placed during the hospital encounter of 02/17/13  HEMOGLOBIN AND HEMATOCRIT, BLOOD      Result Value Ref Range   Hemoglobin 12.1 (*) 13.0 - 17.0 g/dL   HCT 38.6 (*) 39.0 - 57.8 %  BASIC METABOLIC PANEL      Result Value Ref Range   Sodium 137  137 - 147 mEq/L   Potassium 3.8  3.7 - 5.3 mEq/L   Chloride 100  96 - 112 mEq/L   CO2 22  19 - 32 mEq/L   Glucose, Bld 209 (*) 70 - 99 mg/dL   BUN 14  6 - 23 mg/dL   Creatinine, Ser 1.22  0.50 - 1.35 mg/dL   Calcium 8.0 (*) 8.4 - 10.5 mg/dL   GFR calc non Af Amer 58 (*) >90 mL/min   GFR calc Af Amer 68 (*) >90 mL/min  GLUCOSE, CAPILLARY      Result Value Ref Range   Glucose-Capillary 186 (*) 70 - 99 mg/dL  GLUCOSE, CAPILLARY      Result Value Ref Range   Glucose-Capillary 220 (*) 70 - 99 mg/dL   Results for orders placed in visit on 05/29/13  POCT GLYCOSYLATED HEMOGLOBIN (HGB A1C)      Result Value Ref Range   Hemoglobin A1C 11.0    GLUCOSE, POCT (MANUAL RESULT ENTRY)      Result Value Ref Range   POC Glucose 353 (*) 70 - 99 mg/dl        Assessment & Plan:  Diabetes needs better control RF meds See new MD at 104 soon

## 2013-05-29 NOTE — Patient Instructions (Signed)
Calorie Counting Diet A calorie counting diet requires you to eat the number of calories that are right for you in a day. Calories are the measurement of how much energy you get from the food you eat. Eating the right amount of calories is important for staying at a healthy weight. If you eat too many calories, your body will store them as fat and you may gain weight. If you eat too few calories, you may lose weight. Counting the number of calories you eat during a day will help you know if you are eating the right amount. A Registered Dietitian can determine how many calories you need in a day. The amount of calories needed varies from person to person. If your goal is to lose weight, you will need to eat fewer calories. Losing weight can benefit you if you are overweight or have health problems such as heart disease, high blood pressure, or diabetes. If your goal is to gain weight, you will need to eat more calories. Gaining weight may be necessary if you have a certain health problem that causes your body to need more energy. TIPS Whether you are increasing or decreasing the number of calories you eat during a day, it may be hard to get used to changes in what you eat and drink. The following are tips to help you keep track of the number of calories you eat.  Measure foods at home with measuring cups. This helps you know the amount of food and number of calories you are eating.  Restaurants often serve food in amounts that are larger than 1 serving. While eating out, estimate how many servings of a food you are given. For example, a serving of cooked rice is  cup or about the size of half of a fist. Knowing serving sizes will help you be aware of how much food you are eating at restaurants.  Ask for smaller portion sizes or child-size portions at restaurants.  Plan to eat half of a meal at a restaurant. Take the rest home or share the other half with a friend.  Read the Nutrition Facts panel on  food labels for calorie content and serving size. You can find out how many servings are in a package, the size of a serving, and the number of calories each serving has.  For example, a package might contain 3 cookies. The Nutrition Facts panel on that package says that 1 serving is 1 cookie. Below that, it will say there are 3 servings in the container. The calories section of the Nutrition Facts label says there are 90 calories. This means there are 90 calories in 1 cookie (1 serving). If you eat 1 cookie you have eaten 90 calories. If you eat all 3 cookies, you have eaten 270 calories (3 servings x 90 calories = 270 calories). The list below tells you how big or small some common portion sizes are.  1 oz.........4 stacked dice.  3 oz........Marland KitchenDeck of cards.  1 tsp.......Marland KitchenTip of little finger.  1 tbs......Marland KitchenMarland KitchenThumb.  2 tbs.......Marland KitchenGolf ball.   cup......Marland KitchenHalf of a fist.  1 cup.......Marland KitchenA fist. KEEP A FOOD LOG Write down every food item you eat, the amount you eat, and the number of calories in each food you eat during the day. At the end of the day, you can add up the total number of calories you have eaten. It may help to keep a list like the one below. Find out the calorie information by reading the  Nutrition Facts panel on food labels. Breakfast  Bran cereal (1 cup, 110 calories).  Fat-free milk ( cup, 45 calories). Snack  Apple (1 medium, 80 calories). Lunch  Spinach (1 cup, 20 calories).  Tomato ( medium, 20 calories).  Chicken breast strips (3 oz, 165 calories).  Shredded cheddar cheese ( cup, 110 calories).  Light [place] dressing (2 tbs, 60 calories).  Whole-wheat bread (1 slice, 80 calories).  Tub margarine (1 tsp, 35 calories).  Vegetable soup (1 cup, 160 calories). Dinner  Pork chop (3 oz, 190 calories).  Brown rice (1 cup, 215 calories).  Steamed broccoli ( cup, 20 calories).  Strawberries (1  cup, 65 calories).  Whipped cream (1 tbs, 50  calories). Daily Calorie Total: 2919 Document Released: 01/01/2005 Document Revised: 03/26/2011 Document Reviewed: 06/28/2006 [facility] Patient Information 2014 [place].

## 2013-05-29 NOTE — Progress Notes (Signed)
   Subjective:    Patient ID: Edward [name], male    DOB: June 28, 1940, 73 y.o.   MRN: [id number]  HPI    Review of Systems     Objective:   Physical Exam        Assessment & Plan:

## 2013-06-02 ENCOUNTER — Encounter: Payer: Self-pay | Admitting: Cardiovascular Disease

## 2013-06-02 ENCOUNTER — Telehealth: Payer: Self-pay | Admitting: Nurse Practitioner

## 2013-06-02 DIAGNOSIS — I4891 Unspecified atrial fibrillation: Secondary | ICD-10-CM

## 2013-06-02 NOTE — Telephone Encounter (Signed)
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  I advised patient that per Dr. Nahser, a 24 hour holter monitor can be placed and that the other requirements need to be met by his PCP.  I advised patient that he will receive a call to schedule this appointment.  Order in epic.   

## 2013-06-04 ENCOUNTER — Encounter (INDEPENDENT_AMBULATORY_CARE_PROVIDER_SITE_OTHER): Payer: Federal, State, Local not specified - PPO

## 2013-06-04 ENCOUNTER — Encounter: Payer: Self-pay | Admitting: *Deleted

## 2013-06-04 DIAGNOSIS — I4891 Unspecified atrial fibrillation: Secondary | ICD-10-CM

## 2013-06-04 NOTE — Progress Notes (Signed)
Patient ID: Edward [name], male   DOB: Jan 28, 1940, 73 y.o.   MRN: [id number] [employer] 24 hour holter monitor applied to patient.

## 2013-06-10 ENCOUNTER — Encounter: Payer: Self-pay | Admitting: Endocrinology

## 2013-06-10 ENCOUNTER — Other Ambulatory Visit: Payer: Self-pay

## 2013-06-10 ENCOUNTER — Ambulatory Visit (INDEPENDENT_AMBULATORY_CARE_PROVIDER_SITE_OTHER): Payer: Federal, State, Local not specified - PPO | Admitting: Endocrinology

## 2013-06-10 VITALS — BP 128/98 | HR 87 | Temp 97.7°F | Ht 68.5 in | Wt 243.0 lb

## 2013-06-10 DIAGNOSIS — I2510 Atherosclerotic heart disease of native coronary artery without angina pectoris: Secondary | ICD-10-CM

## 2013-06-10 DIAGNOSIS — E119 Type 2 diabetes mellitus without complications: Secondary | ICD-10-CM

## 2013-06-10 MED ORDER — GLIMEPIRIDE 4 MG PO TABS: 4.0000 mg | ORAL_TABLET | Freq: Every day | ORAL | Status: DC

## 2013-06-10 MED ORDER — GLUCOSE BLOOD VI STRP: ORAL_STRIP | Status: DC

## 2013-06-10 MED ORDER — ACARBOSE 50 MG PO TABS: 50.0000 mg | ORAL_TABLET | Freq: Every day | ORAL | Status: DC

## 2013-06-10 MED ORDER — LIRAGLUTIDE 18 MG/3ML ~~LOC~~ SOPN: 1.8000 mg | PEN_INJECTOR | Freq: Every day | SUBCUTANEOUS | Status: DC

## 2013-06-10 MED ORDER — ONETOUCH DELICA LANCETS FINE MISC: Status: DC

## 2013-06-10 MED ORDER — LIRAGLUTIDE 18 MG/3ML ~~LOC~~ SOPN
1.8000 mg | PEN_INJECTOR | Freq: Every day | SUBCUTANEOUS | Status: DC
Start: 2013-06-10 — End: 2013-06-10

## 2013-06-10 MED ORDER — GLUCOSE BLOOD VI STRP: 1.0000 | ORAL_STRIP | Freq: Every day | Status: DC

## 2013-06-10 NOTE — Patient Instructions (Addendum)
good diet and exercise habits significanly improve the control of your diabetes.  please let me know if you wish to be referred to a dietician.  high blood sugar is very risky to your health.  you should see an eye doctor and dentist every year.  You are at higher than average risk for pneumonia and hepatitis-B.  You should be vaccinated against both.   controlling your blood pressure and cholesterol drastically reduces the damage diabetes does to your body.  this also applies to quitting smoking.  please discuss these with your doctor.  check your blood sugar once a day.  vary the time of day when you check, between before the 3 meals, and at bedtime.  also check if you have symptoms of your blood sugar being too high or too low.  please keep a record of the readings and bring it to your next appointment here.  You can write it on any piece of paper.  please call us sooner if your blood sugar goes below 70, or if you have a lot of readings over 200.   You should take "victoza" pen, once a day.  The side-effect is nausea, which goes away with time.  To avoid this side-effect, start with the lowest (0.6) setting.  After a few days, increase to 1.2.  If you still have little or no nausea, increase to the highest (1.8) setting, and continue that setting.  Here is a discount card.  This medication replaces the "januvia."    Also please add glimepiride and precose.   i have sent prescriptions to your pharmacy, for each of these.   Please come back for a follow-up appointment in 3 months.

## 2013-06-10 NOTE — Progress Notes (Signed)
Subjective:    Patient ID: Edward [name], male    DOB: 01-05-1941, 73 y.o.   MRN: [id number]  HPI pt states DM was dx'ed in 2005; he has mild neuropathy of the lower extremities, and associated CAD and PAD.  he has never been on insulin.  pt says his diet and exercise are "moderately good."  In particular, he says activity is limited by CHF.  He has never had pancreatitis, severe hypoglycemia or DKA.  He stopped bydureon 1 year ago.  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   Past Medical History  Diagnosis Date  . A-fib   . HTN (hypertension)   . Dyslipidemia   . PVD (peripheral vascular disease)     s/p Right CEA March, 2011,   . Claudication   . Hyperlipidemia   . GERD (gastroesophageal reflux disease)   . Diabetic neuropathy     mostly feet/ legs  . Pacemaker   . Stroke     ?  Marland Kitchen Anemia     ?  . Diastolic CHF 0/07/6806  . Exertional shortness of breath   . Type II diabetes mellitus   . Neuromuscular disorder     neuropathy  . Coronary artery disease     Past Surgical History  Procedure Laterality Date  . Carotid endarterectomy Right 03/2009  . Cholecystectomy    . Tonsillectomy and adenoidectomy    . Knee arthroscopy Left     "I had 3" (11/05/2012)  . Maze  06/14/2011    Procedure: MAZE;  Surgeon: Gaye Pollack, MD;  Location: [facility];  Service: Open Heart Surgery;  Laterality: N/A;  Ligate left atrial appendage  . Circumcision    . Femoral-popliteal bypass graft Left 03/06/2012    Procedure: BYPASS GRAFT FEMORAL-POPLITEAL ARTERY;  Surgeon: Angelia Mould, MD;  Location: [facility] OR;  Service: Vascular;  Laterality: Left;  Left Femoral - Below Knee Popliteal Bypass Graft with Vein and Intraoperative Arteriogram.  . Total knee arthroplasty Left   . Lead revision  11/05/2012    pacemaker/notes 11/05/2012  . Coronary artery bypass graft  06/14/2011    Procedure: CORONARY ARTERY BYPASS GRAFTING (CABG);  Surgeon: Gaye Pollack, MD;  Location: [facility];  Service: Open Heart Surgery;  Laterality: N/A;  Coronary Artery Bypass Graft on pump times six;  utilizing internal mammary artery and right greater saphenous vein harvested endoscopically.x 5 vessels  . Insert / replace / remove pacemaker  2013    /notes 11/05/2012-[employer]  . Joint replacement Left     left TKA  . Esophagogastroduodenoscopy (egd) with propofol N/A 02/17/2013    Procedure: ESOPHAGOGASTRODUODENOSCOPY (EGD) WITH PROPOFOL;  Surgeon: Jerene Bears, MD;  Location: [facility];  Service: Gastroenterology;  Laterality: N/A;  . Balloon dilation N/A 02/17/2013    Procedure: BALLOON DILATION;  Surgeon: Jerene Bears, MD;  Location: [facility];  Service: Gastroenterology;  Laterality: N/A;  . Colonoscopy with propofol N/A 02/17/2013    Procedure: COLONOSCOPY WITH PROPOFOL;  Surgeon: Jerene Bears, MD;  Location: [facility];  Service: Gastroenterology;  Laterality: N/A;    History   Social History  . Marital Status: Married    Spouse Name: N/A    Number of Children: N/A  . Years of Education: N/A   Occupational History  . Not on file.   Social History Main Topics  . Smoking status: Former Smoker -- 3.00 packs/day for 32 years    Types: Cigarettes  Quit date: 01/16/1979  . Smokeless tobacco: Never Used  . Alcohol Use: 1.2 oz/week    2 Glasses of wine per week     Comment: beer 1-2 weekly  . Drug Use: No  . Sexual Activity: Not Currently   Other Topics Concern  . Not on file   Social History Narrative  . No narrative on file    Current Outpatient Prescriptions on File Prior to Visit  Medication Sig Dispense Refill  . acetaminophen (TYLENOL) 500 MG tablet Take 500 mg by mouth every 6 (six) hours as needed for mild pain or moderate pain.       Marland Kitchen aspirin 81 MG tablet Take 81 mg by mouth daily.      . carvedilol (COREG) 25 MG tablet Take 1 tablet (25 mg total) by mouth 2 (two) times daily.  180 tablet  3  . diphenhydramine-acetaminophen (TYLENOL PM) 25-500 MG TABS  Take 1 tablet by mouth at bedtime as needed (sleep).       . furosemide (LASIX) 40 MG tablet Take 2 tablets (80 mg total) by mouth daily.  30 tablet  3  . Iron Combinations (IRON COMPLEX PO) Take 2 capsules by mouth daily.      Marland Kitchen L-Methylfolate-Algae-B12-B6 (METANX) 3-90.314-2-35 MG CAPS Take 1 capsule by mouth daily.  60 capsule  3  . loratadine (CLARITIN) 10 MG tablet Take 10 mg by mouth daily.      Marland Kitchen losartan (COZAAR) 100 MG tablet TAKE 1 TABLET BY MOUTH ONCE DAILY  90 tablet  2  . magnesium gluconate (MAGONATE) 500 MG tablet Take 500 mg by mouth daily.       . metFORMIN (GLUCOPHAGE) 1000 MG tablet Take 1 tablet by mouth every am and 1 1/2 tablets every pm  225 tablet  0  . MOVIPREP 100 G SOLR Use per prep instruction  1 kit  0  . Multiple Vitamin (MULTIVITAMIN WITH MINERALS) TABS Take 1 tablet by mouth daily.      Marland Kitchen omeprazole (PRILOSEC) 20 MG capsule daily.      Marland Kitchen oxymetazoline (AFRIN) 0.05 % nasal spray Place 2 sprays into the nose daily as needed for congestion.      . potassium chloride SA (KLOR-CON M20) 20 MEQ tablet TAKE 1 TABLET BY MOUTH EVERY DAY  90 tablet  2  . pravastatin (PRAVACHOL) 80 MG tablet TAKE 1 TABLET BY MOUTH EVERY DAY  90 tablet  2  . sucralfate (CARAFATE) 1 G tablet Take 1 tablet (1 g total) by mouth 3 (three) times daily before meals.  180 tablet  3   No current facility-administered medications on file prior to visit.    Allergies  Allergen Reactions  . Lipitor [Atorvastatin Calcium] Other (See Comments)    Reaction unknown    Family History  Problem Relation Age of Onset  . Coronary artery disease Father   . Hypertension Father   . Heart disease Father     Heart Disease before age 20  . Heart attack Mother   . Coronary artery disease Mother   . Deep vein thrombosis Mother   . Heart disease Mother     Heart Disease before age 51  . Hyperlipidemia Mother   . Hypertension Mother   . Hypertension Sister   . Heart disease Sister     Heart Disease  before age 86  . Hyperlipidemia Sister   . Anesthesia problems Neg Hx   . Heart disease Brother     Heart Disease before  age 7    BP 128/98  Pulse 87  Temp(Src) 97.7 F (36.5 C) (Oral)  Ht 5' 8.5" (1.74 m)  Wt 243 lb (110.224 kg)  BMI 36.41 kg/m2  SpO2 93%  Review of Systems denies weight loss, blurry vision, headache, chest pain, n/v, muscle cramps, excessive diaphoresis, memory loss, depression, cold intolerance, rhinorrhea, and easy bruising.  He has doe.  He attributes excessive urination to diuretic rx.      Objective:   Physical Exam VS: see vs page GEN: no distress HEAD: head: no deformity eyes: no periorbital swelling, no proptosis.   external nose and ears are normal.   mouth: no lesion seen.   NECK: a healed scar (left CEA) is present.  i do not appreciate a nodule in the thyroid or elsewhere in the neck.   CHEST WALL: no deformity.  Old healed surgical scar (median sternotomy).  Pacemaker is noted.   LUNGS: clear to auscultation.  BREASTS:  No gynecomastia.   CV: reg rate and rhythm, no murmur.   ABD: abdomen is soft, nontender.  no hepatosplenomegaly.  not distended.  no hernia.   MUSCULOSKELETAL: muscle bulk and strength are grossly normal.  no obvious joint swelling.  gait is normal and steady.   EXTEMITIES: no deformity.  no ulcer on the feet.  feet are of normal color and temp.  2+ bilat leg edema.  Old healed surgical scars (left TKR, and left leg vein harvest) PULSES: dorsalis pedis intact bilat.  no carotid bruit.   NEURO:  cn 2-12 grossly intact.   readily moves all 4's.  sensation is intact to touch on the feet, but decreased from normal.   SKIN:  Normal texture and temperature.  No rash or suspicious lesion is visible.   NODES:  None palpable at the neck.   PSYCH: alert, well-oriented.  Does not appear anxious nor depressed.    Lab Results  Component Value Date   HGBA1C 11.0 05/29/2013  i have reviewed the records in epic from other provider(s).   i  reviewed electrocardiogram from feb, 2015    Assessment & Plan:  DM: new to me: severe exacerbation CHF: new to me: this limits medical rx of DM: This impairs the ability to achieve glycemic control.  I'll work around this as best I can.  Weight-loss is advised.  CAD: in this setting, he should avoid hypoglycemia: when we can, we'll favor DM meds that do not cause hypoglycemia.     Patient Instructions  good diet and exercise habits significanly improve the control of your diabetes.  please let me know if you wish to be referred to a dietician.  high blood sugar is very risky to your health.  you should see an eye doctor and dentist every year.  You are at higher than average risk for pneumonia and hepatitis-B.  You should be vaccinated against both.   controlling your blood pressure and cholesterol drastically reduces the damage diabetes does to your body.  this also applies to quitting smoking.  please discuss these with your doctor.  check your blood sugar once a day.  vary the time of day when you check, between before the 3 meals, and at bedtime.  also check if you have symptoms of your blood sugar being too high or too low.  please keep a record of the readings and bring it to your next appointment here.  You can write it on any piece of paper.  please call us sooner if  your blood sugar goes below 70, or if you have a lot of readings over 200.   You should take "victoza" pen, once a day.  The side-effect is nausea, which goes away with time.  To avoid this side-effect, start with the lowest (0.6) setting.  After a few days, increase to 1.2.  If you still have little or no nausea, increase to the highest (1.8) setting, and continue that setting.  Here is a discount card.  This medication replaces the "januvia."    Also please add glimepiride and precose.   i have sent prescriptions to your pharmacy, for each of these.   Please come back for a follow-up appointment in 3 months.

## 2013-06-11 ENCOUNTER — Other Ambulatory Visit: Payer: Self-pay | Admitting: Nurse Practitioner

## 2013-06-11 ENCOUNTER — Telehealth: Payer: Self-pay | Admitting: Nurse Practitioner

## 2013-06-11 ENCOUNTER — Telehealth: Payer: Self-pay | Admitting: Endocrinology

## 2013-06-11 NOTE — Telephone Encounter (Signed)
Spoke with patient and advised him of monitor results of atrial fib with pacing per Dr. Acie Fredrickson.   Patient verbalized understanding and gratitude.

## 2013-06-11 NOTE — Telephone Encounter (Signed)
Patient needs pen needles sent to [facility] on [address] Paper script pen needles for [facility]   Thank You :)

## 2013-06-12 ENCOUNTER — Other Ambulatory Visit: Payer: Self-pay

## 2013-06-12 MED ORDER — INSULIN PEN NEEDLE 32G X 5 MM MISC: Status: DC

## 2013-06-12 NOTE — Telephone Encounter (Signed)
Rx faxed to pharmacy  

## 2013-06-15 ENCOUNTER — Encounter: Payer: Self-pay | Admitting: Cardiovascular Disease

## 2013-06-16 ENCOUNTER — Other Ambulatory Visit (INDEPENDENT_AMBULATORY_CARE_PROVIDER_SITE_OTHER): Payer: Federal, State, Local not specified - PPO

## 2013-06-16 ENCOUNTER — Ambulatory Visit (INDEPENDENT_AMBULATORY_CARE_PROVIDER_SITE_OTHER): Payer: Federal, State, Local not specified - PPO | Admitting: Nurse Practitioner

## 2013-06-16 ENCOUNTER — Telehealth: Payer: Self-pay | Admitting: *Deleted

## 2013-06-16 ENCOUNTER — Encounter: Payer: Self-pay | Admitting: Nurse Practitioner

## 2013-06-16 VITALS — BP 154/90 | HR 84 | Ht 68.5 in | Wt 240.8 lb

## 2013-06-16 DIAGNOSIS — K922 Gastrointestinal hemorrhage, unspecified: Secondary | ICD-10-CM

## 2013-06-16 DIAGNOSIS — I2510 Atherosclerotic heart disease of native coronary artery without angina pectoris: Secondary | ICD-10-CM

## 2013-06-16 DIAGNOSIS — D649 Anemia, unspecified: Secondary | ICD-10-CM

## 2013-06-16 LAB — CBC
HEMATOCRIT: 40.6 % (ref 39.0–52.0)
HEMOGLOBIN: 13.3 g/dL (ref 13.0–17.0)
MCHC: 32.8 g/dL (ref 30.0–36.0)
MCV: 76.7 fl — AB (ref 78.0–100.0)
PLATELETS: 221 10*3/uL (ref 150.0–400.0)
RBC: 5.3 Mil/uL (ref 4.22–5.81)
RDW: 17.1 % — ABNORMAL HIGH (ref 11.5–15.5)
WBC: 9.4 10*3/uL (ref 4.0–10.5)

## 2013-06-16 NOTE — Telephone Encounter (Signed)
Message copied by Rosana Berger on Tue Jun 16, 2013  4:22 PM ------      Message from: Tanda Rockers      Created: Tue Jun 16, 2013  2:07 PM       We can do it - I'll get my nurse to schedule for you            Thx!            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  We do not do these.  Is this something your office can do for him.              Thanks            Customer service manager             ------

## 2013-06-16 NOTE — Telephone Encounter (Signed)
LMTCB for the pt so we can schedule 6MW

## 2013-06-16 NOTE — Patient Instructions (Signed)
Your physician has requested that you go to the basement for the following lab work before leaving today: Cody, NP will call you in the morning if CBC comes back abnormal

## 2013-06-17 ENCOUNTER — Telehealth: Payer: Self-pay | Admitting: Gastroenterology

## 2013-06-17 ENCOUNTER — Encounter: Payer: Self-pay | Admitting: Internal Medicine

## 2013-06-17 NOTE — Progress Notes (Addendum)
     History of Present Illness:  Patent is a 73 year old male with multiple medical problems. He was evaluated by [place] in November 2014 for anemia and heme positive stools. Patient had chronic anemia but hgb was down from baseline when he came to [place] in November.  A few week prior to that visit patient had some rectal bleeding. His last colonoscopy was only 2 years prior and understandably patient wasn't interested in repeating one. Repeat hgb was stable at 9.8 but MCV was low implying iron deficiency. At our request patient underwent EGD and a repeat colonoscopy. Exams didn't reveal source of anemia. Small bowel endo capsule study recommended but patient wanted to hold off.   Patient hasn't had any overt GI bleeding. BMs normal. No nausea or abdominal pain. No unexpected weight loss. He has no GI complaints   Current Medications, Allergies, Past Medical History, Past Surgical History, Family History and Social History were reviewed in [employer] record.  Physical Exam: General: Pleasant, well developed , white male in no acute distress Head: Normocephalic and atraumatic Eyes:  sclerae anicteric, conjunctiva pink  Ears: Normal auditory acuity Abdomen: Soft, protuberant, non-tender. No masses, no hepatomegaly. Normal bowel sounds Musculoskeletal: Symmetrical with no gross deformities  Extremities: No edema  Neurological: Alert oriented x 4, grossly nonfocal Psychological:  Alert and cooperative. Normal mood and affect  Assessment and Recommendations: 71. 73 year old male with chronic anemia but heme positive stool back in November 2014. EGD and colonoscopy at that time were unrevealing other than gastritis and adenomatous polyps. Patient opted to hold off on small bowel video capsule study at the time. Patient hasn't had any overt bleeding. Will check CBC today to ensure hgb has remained stable. If hgb is stable then no further anemia workup is needed. If at some point  anemia worsens then will need to pursue small bowel video capsule. 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 We will prepare a letter to [facility] physician regarding current status of anemia once CBC results return.   2. Multiple medical problems as listed in PMH   Addendum: Reviewed and agree with management. Procedure video capsule endoscopy for any evidence of recurrent bleeding, recurrent anemia or iron deficiency Jerene Bears, MD  CBC    Component Value Date/Time   WBC 9.4 06/16/2013 1443   WBC 10.8* 12/01/2012 0952   RBC 5.30 06/16/2013 1443   RBC 4.81 12/01/2012 0952   RBC 3.99* 07/31/2010 0713   HGB 13.3 06/16/2013 1443   HGB 9.9* 12/01/2012 0952   HCT 40.6 06/16/2013 1443   HCT 33.5* 12/01/2012 0952   PLT 221.0 06/16/2013 1443   MCV 76.7* 06/16/2013 1443   MCV 69.7* 12/01/2012 0952   MCH 20.6* 12/01/2012 0952   MCH 21.8* 03/07/2012 0540   MCHC 32.8 06/16/2013 1443   MCHC 29.6* 12/01/2012 0952   RDW 17.1* 06/16/2013 1443   LYMPHSABS 1.1 12/16/2012 1132   MONOABS 1.1* 12/16/2012 1132   EOSABS 0.4 12/16/2012 1132   BASOSABS 0.0 12/16/2012 1132

## 2013-06-17 NOTE — Telephone Encounter (Signed)
Called pt. Phone just rings then goes into a fax ring. Will try back later.

## 2013-06-17 NOTE — Telephone Encounter (Signed)
Spoke with the pt and scheduled 6MW for 06/23/13 at 9: 30 am

## 2013-06-18 ENCOUNTER — Encounter: Payer: Self-pay | Admitting: Internal Medicine

## 2013-06-22 ENCOUNTER — Telehealth: Payer: Self-pay | Admitting: Internal Medicine

## 2013-06-22 NOTE — Telephone Encounter (Signed)
Walk  In pt Form " Sealed Envelope" Gave to Sherri P   6.8.15/km

## 2013-06-23 ENCOUNTER — Encounter: Payer: Self-pay | Admitting: Cardiovascular Disease

## 2013-06-23 ENCOUNTER — Ambulatory Visit (INDEPENDENT_AMBULATORY_CARE_PROVIDER_SITE_OTHER): Payer: Federal, State, Local not specified - PPO | Admitting: Internal Medicine

## 2013-06-23 DIAGNOSIS — R0989 Other specified symptoms and signs involving the circulatory and respiratory systems: Secondary | ICD-10-CM

## 2013-06-23 DIAGNOSIS — R0600 Dyspnea, unspecified: Secondary | ICD-10-CM

## 2013-06-23 DIAGNOSIS — R0609 Other forms of dyspnea: Secondary | ICD-10-CM

## 2013-06-24 ENCOUNTER — Encounter: Payer: Self-pay | Admitting: Internal Medicine

## 2013-06-24 ENCOUNTER — Telehealth: Payer: Self-pay | Admitting: Internal Medicine

## 2013-06-24 ENCOUNTER — Ambulatory Visit (INDEPENDENT_AMBULATORY_CARE_PROVIDER_SITE_OTHER): Payer: Federal, State, Local not specified - PPO | Admitting: Internal Medicine

## 2013-06-24 VITALS — BP 132/78 | HR 90 | Temp 98.0°F | Ht 69.5 in | Wt 242.0 lb

## 2013-06-24 DIAGNOSIS — I2510 Atherosclerotic heart disease of native coronary artery without angina pectoris: Secondary | ICD-10-CM

## 2013-06-24 DIAGNOSIS — R0600 Dyspnea, unspecified: Secondary | ICD-10-CM

## 2013-06-24 DIAGNOSIS — R0609 Other forms of dyspnea: Secondary | ICD-10-CM

## 2013-06-24 DIAGNOSIS — R0989 Other specified symptoms and signs involving the circulatory and respiratory systems: Secondary | ICD-10-CM

## 2013-06-24 NOTE — Patient Instructions (Signed)
Your six minute walk did not show any evidence of a pulmonary limitation and your blood pressure was only slightly elevated during the test  but this caused you no symptoms whatsover.  Omeprazole should be Take 30-60 min before first meal of the day   Pulmonary follow up is not needed unless you start having breathing problems again.

## 2013-06-24 NOTE — Progress Notes (Signed)
Subjective:     Patient ID: Edward [name], male   DOB: [birth year] MRN: [id number]   Brief patient profile:  26 yowm quit smoking 1981 @ wt around 210 with apparent  Onset of breathing difficult late spring  2013  "thought it was his heart"  but had cabg 06/14/11 at wt around 225  and no improvement to date in doe with no def cardiac source identified by Edward [name]  so self referred to pulmonary clinic 11/21/2012 .   History of Present Illness  11/21/2012 1st [facility] office visit/ Edward [name] cc sob x exertion x steps x one flight at home does it s stopping but short of breath at top.  Does ok at [employer], does downhill to mailbox fine but uphill to house from  [place] has to go slow or has to stop, seems to be getting worse gradually over time. Never occurs at rest or supine/ sleeping.  rec Prilosec 20 mg take  X  2 x 30 min before breakfast and Pepcid 20 mg at bedtime consistently until return GERD diet W/u for Fe def> Edward [name] eval and rx (see emr)    06/24/2013 f/u ov/Edward [name] re: doe/ resolved to pt's satisfaction on gerd rx/ no pulmonary meds Chief Complaint  Patient presents with  . Follow-up    Pt here to followup to discuss 6MW test. He denies any respiratory co's today.   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 No unusual exp hx or h/o childhood pna/ asthma or knowledge of premature birth.  Sleeping ok without nocturnal  or early am exacerbation  of respiratory  c/o's or need for noct saba. Also denies any obvious fluctuation of symptoms with weather or environmental changes or other aggravating or alleviating factors except as outlined above   Current Medications, Allergies, Complete Past Medical  History, Past Surgical History, Family History, and Social History were reviewed in [employer] record.  ROS  The following are not active complaints unless bolded sore throat, dysphagia, dental problems, itching, sneezing,  nasal congestion or excess/ purulent secretions, ear ache,   fever, chills, sweats, unintended wt loss, pleuritic or exertional cp, hemoptysis,  orthopnea pnd or leg swelling, presyncope, palpitations, heartburn, abdominal pain, anorexia, nausea, vomiting, diarrhea  or change in bowel or urinary habits, change in stools or urine, dysuria,hematuria,  rash, arthralgias, visual complaints, headache, numbness weakness or ataxia or problems with walking or coordination,  change in mood/affect or memory.                Objective:   Physical Exam   amb wm who failed to answer a single question asked in a straightforward manner, tending to go off on tangents or answer questions with ambiguous medical terms or diagnoses and seemed aggravated  when asked the same question more than once for clarification, very confused with details of care eg  "when did you first notice your breathing problem"  - every interviewer including Edward [name] got a different answer ranging from 2 weeks ago to 30 years ago.   06/24/13           242 Wt Readings from Last 3 Encounters:  11/21/12 243 lb (110.224  kg)  11/06/12 249 lb 1.9 oz (113 kg)  11/06/12 249 lb 1.9 oz (113 kg)     HEENT: nl dentition, turbinates, and orophanx. Nl external ear canals without cough reflex   NECK :  without JVD/Nodes/TM/ nl carotid upstrokes bilaterally   LUNGS: no acc muscle use, pseudowheeze resolved     CV:  RRR  no s3 or murmur or increase in P2, no edema   ABD:  Quite obese but soft and nontender with nl excursion in the supine position. No bruits or organomegaly, bowel sounds nl  MS:  warm without deformities, calf tenderness, cyanosis or clubbing  SKIN: warm and dry without lesions     NEURO:  alert,   no deficits   cxr10/23/14  No pneumothorax. Evaluation of the  retrosternal clear space is obscured secondary to overlying osseous  and soft tissue structures. The previously questioned nodule  overlying the right hilum has resolved in the interval. There is  grossly unchanged mild slightly nodular thickening of the pulmonary  interstitium. No focal airspace opacities. No pleural effusion or  pneumothorax. No evidence of edema.      Assessment:

## 2013-06-24 NOTE — Telephone Encounter (Signed)
Pt was added to MW schedule today.

## 2013-06-25 ENCOUNTER — Encounter: Payer: Self-pay | Admitting: Endocrinology

## 2013-06-25 NOTE — Assessment & Plan Note (Signed)
-   spirometry 11/21/2012 > FEV1  2.29 (67%) ratio 74  - 11/21/2012  Walked RA x 3 laps @ 185 ft each stopped due to end of study, rapid pace, no desats  - 06/24/13 6 m walk 270 m ("I didn't know I was supposed to go as fast as I could") with no desats  I had an extended discussion with the patient today lasting 15 to 20 minutes of a 25 minute visit on the following issues:  Resolved to his satisfaction with no pulmonary problem identified except for restrictive changes from obesity.  Pulmonary f/u is prn.

## 2013-07-08 ENCOUNTER — Other Ambulatory Visit (INDEPENDENT_AMBULATORY_CARE_PROVIDER_SITE_OTHER): Payer: Federal, State, Local not specified - PPO

## 2013-07-08 DIAGNOSIS — E119 Type 2 diabetes mellitus without complications: Secondary | ICD-10-CM

## 2013-07-08 LAB — URINALYSIS, ROUTINE W REFLEX MICROSCOPIC
Bilirubin Urine: NEGATIVE
Hgb urine dipstick: NEGATIVE
Ketones, ur: NEGATIVE
Leukocytes, UA: NEGATIVE
Nitrite: NEGATIVE
PH: 7 (ref 5.0–8.0)
SPECIFIC GRAVITY, URINE: 1.015 (ref 1.000–1.030)
Total Protein, Urine: 100 — AB
URINE GLUCOSE: NEGATIVE
Urobilinogen, UA: 0.2 (ref 0.0–1.0)

## 2013-07-08 LAB — MICROALBUMIN / CREATININE URINE RATIO
CREATININE, U: 74.6 mg/dL
Microalb Creat Ratio: 75.4 mg/g — ABNORMAL HIGH (ref 0.0–30.0)
Microalb, Ur: 56.2 mg/dL — ABNORMAL HIGH (ref 0.0–1.9)

## 2013-07-08 LAB — HEMOGLOBIN A1C: Hgb A1c MFr Bld: 9.3 % — ABNORMAL HIGH (ref 4.6–6.5)

## 2013-07-09 ENCOUNTER — Telehealth: Payer: Self-pay | Admitting: Endocrinology

## 2013-07-09 NOTE — Telephone Encounter (Signed)
Could you look at pt's lab. Lab results from 07/08/2013 and please advise. Orders were placed under Dr. Dwyane Dee by mistake.  Thanks!

## 2013-07-09 NOTE — Telephone Encounter (Signed)
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 Patient states that he needs this as soon as possible.

## 2013-07-09 NOTE — Telephone Encounter (Signed)
Please see below.

## 2013-07-10 NOTE — Telephone Encounter (Signed)
Requested call back to discuss.

## 2013-07-10 NOTE — Telephone Encounter (Signed)
please call patient: Your a1c is improved, but still high.  This blood test takes 3 months to fully reflect improvement in your blood sugar.  How are your cbg's doing?

## 2013-07-13 ENCOUNTER — Other Ambulatory Visit: Payer: Self-pay

## 2013-07-13 MED ORDER — LIRAGLUTIDE 18 MG/3ML ~~LOC~~ SOPN: 1.8000 mg | PEN_INJECTOR | Freq: Every day | SUBCUTANEOUS | Status: DC

## 2013-07-13 MED ORDER — GLIMEPIRIDE 4 MG PO TABS: 4.0000 mg | ORAL_TABLET | Freq: Every day | ORAL | Status: DC

## 2013-07-13 NOTE — Telephone Encounter (Signed)
Pt came to office on 07/13/2013. Lab results given then.

## 2013-07-15 ENCOUNTER — Encounter: Payer: Self-pay | Admitting: Endocrinology

## 2013-07-23 ENCOUNTER — Other Ambulatory Visit: Payer: Self-pay | Admitting: Internal Medicine

## 2013-07-29 ENCOUNTER — Encounter: Payer: Self-pay | Admitting: Endocrinology

## 2013-07-29 ENCOUNTER — Other Ambulatory Visit (INDEPENDENT_AMBULATORY_CARE_PROVIDER_SITE_OTHER): Payer: Federal, State, Local not specified - PPO

## 2013-07-29 ENCOUNTER — Other Ambulatory Visit: Payer: Self-pay

## 2013-07-29 DIAGNOSIS — E118 Type 2 diabetes mellitus with unspecified complications: Secondary | ICD-10-CM

## 2013-07-29 LAB — HEMOGLOBIN A1C: Hgb A1c MFr Bld: 7.8 % — ABNORMAL HIGH (ref 4.6–6.5)

## 2013-07-30 ENCOUNTER — Encounter: Payer: Self-pay | Admitting: Endocrinology

## 2013-07-30 DIAGNOSIS — Z0279 Encounter for issue of other medical certificate: Secondary | ICD-10-CM

## 2013-08-25 ENCOUNTER — Encounter: Payer: Self-pay | Admitting: Family

## 2013-08-26 ENCOUNTER — Ambulatory Visit (HOSPITAL_COMMUNITY)
Admission: RE | Admit: 2013-08-26 | Discharge: 2013-08-26 | Disposition: A | Discharge status: Home / Self Care | Payer: Federal, State, Local not specified - PPO | Source: Ambulatory Visit | Attending: Family | Admitting: Family

## 2013-08-26 ENCOUNTER — Ambulatory Visit (INDEPENDENT_AMBULATORY_CARE_PROVIDER_SITE_OTHER): Payer: Federal, State, Local not specified - PPO | Admitting: Family

## 2013-08-26 ENCOUNTER — Ambulatory Visit (INDEPENDENT_AMBULATORY_CARE_PROVIDER_SITE_OTHER)
Admission: RE | Admit: 2013-08-26 | Discharge: 2013-08-26 | Disposition: A | Discharge status: Home / Self Care | Payer: Federal, State, Local not specified - PPO | Source: Ambulatory Visit | Attending: Family | Admitting: Family

## 2013-08-26 ENCOUNTER — Encounter: Payer: Self-pay | Admitting: Family

## 2013-08-26 VITALS — BP 164/95 | HR 79 | Resp 16 | Ht 69.5 in | Wt 236.0 lb

## 2013-08-26 DIAGNOSIS — Z48812 Encounter for surgical aftercare following surgery on the circulatory system: Secondary | ICD-10-CM

## 2013-08-26 DIAGNOSIS — I739 Peripheral vascular disease, unspecified: Secondary | ICD-10-CM

## 2013-08-26 DIAGNOSIS — I6529 Occlusion and stenosis of unspecified carotid artery: Secondary | ICD-10-CM

## 2013-08-26 NOTE — Progress Notes (Signed)
[facility]  HISTORY AND PHYSICAL   MRN : [id number]  History of Present Illness:   Edward [name] is a 73 y.o. male patient of Dr. Scot Dock who is s/p left femoropopliteal bypass graft with vein on 03/06/2012.  He is also s/p right CEA on 03/22/09.  He denies claudication symptoms, denies non-healing wounds.  He had slurred speech for about a day, in 2012, no further TIA or stroke symptoms.  He states that his systolic blood pressure is usually 140-150's.  Pt reports New Medical or Surgical History: none  He states his recent stress test was normal. 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   Pt Diabetic: Yes, under control, per pt  Pt smoker: former smoker, quit in 1981  Pt meds include:  Statin :Yes  Betablocker: Yes  ASA: Yes  Other anticoagulants/antiplatelets: no   Current Outpatient Prescriptions  Medication Sig Dispense Refill  . acarbose (PRECOSE) 50 MG tablet Take 1 tablet (50 mg total) by mouth daily with breakfast.  90 tablet  11  . acetaminophen (TYLENOL) 500 MG tablet Take 500 mg by mouth every 6 (six) hours as needed for mild pain or moderate pain.       Marland Kitchen aspirin 81 MG tablet Take 81 mg by mouth daily.      . carvedilol (COREG) 25 MG tablet Take 1 tablet (25 mg total) by mouth 2 (two) times daily.  180 tablet  3  . diphenhydramine-acetaminophen (TYLENOL PM) 25-500 MG TABS Take 1 tablet by mouth at bedtime as needed (sleep).       . furosemide (LASIX) 40 MG tablet Take 2 tablets (80 mg total) by mouth daily.  30 tablet  3  . furosemide (LASIX) 40 MG tablet TAKE 2 TABLETS BY MOUTH EVERY DAY  180 tablet  3  . glimepiride (AMARYL) 4 MG tablet Take 1 tablet (4 mg total) by mouth daily before breakfast.  90 tablet  3  . Iron Combinations (IRON COMPLEX PO) Take 2 capsules by mouth daily.      Marland Kitchen L-Methylfolate-Algae-B12-B6 (METANX) 3-90.314-2-35 MG CAPS Take 1 capsule by mouth daily.  60 capsule  3  . Liraglutide (VICTOZA) 18  MG/3ML SOPN Inject 1.8 mg into the skin daily.  9 mL  11  . loratadine (CLARITIN) 10 MG tablet Take 10 mg by mouth daily.      Marland Kitchen losartan (COZAAR) 100 MG tablet TAKE 1 TABLET BY MOUTH ONCE DAILY  90 tablet  2  . magnesium gluconate (MAGONATE) 500 MG tablet Take 500 mg by mouth daily.       . metFORMIN (GLUCOPHAGE) 1000 MG tablet Take 1 tablet by mouth every am and 1 1/2 tablets every pm  225 tablet  0  . Multiple Vitamin (MULTIVITAMIN WITH MINERALS) TABS Take 1 tablet by mouth daily.      Marland Kitchen NOVOTWIST 32G X 5 MM MISC       . omeprazole (PRILOSEC) 20 MG capsule daily.      Glory Rosebush DELICA LANCETS FINE MISC 1 each by other route daily. 1/day 250.62  100 each  3  . ONETOUCH VERIO test strip       . oxymetazoline (AFRIN) 0.05 % nasal spray Place 2 sprays into the nose daily as needed for congestion.      . potassium chloride SA (KLOR-CON M20) 20 MEQ tablet TAKE 1 TABLET BY MOUTH EVERY DAY  90 tablet  2  . pravastatin (PRAVACHOL) 80 MG  tablet TAKE 1 TABLET BY MOUTH EVERY DAY  90 tablet  2   No current facility-administered medications for this visit.    Past Medical History  Diagnosis Date  . A-fib   . HTN (hypertension)   . Dyslipidemia   . PVD (peripheral vascular disease)     s/p Right CEA March, 2011,   . Claudication   . Hyperlipidemia   . GERD (gastroesophageal reflux disease)   . Diabetic neuropathy     mostly feet/ legs  . Pacemaker   . Stroke     ?  Marland Kitchen Anemia     ?  . Diastolic CHF 0/09/3265  . Exertional shortness of breath   . Type II diabetes mellitus   . Neuromuscular disorder     neuropathy  . Coronary artery disease     Social History History  Substance Use Topics  . Smoking status: Former Smoker -- 3.00 packs/day for 32 years    Types: Cigarettes    Quit date: 01/16/1979  . Smokeless tobacco: Never Used  . Alcohol Use: 1.2 oz/week    2 Glasses of wine per week     Comment: beer 1-2 weekly (occasional)    Family History Family History  Problem Relation  Age of Onset  . Coronary artery disease Father   . Hypertension Father   . Heart disease Father     Heart Disease before age 7  . Heart attack Mother   . Coronary artery disease Mother   . Deep vein thrombosis Mother   . Heart disease Mother     Heart Disease before age 44  . Hyperlipidemia Mother   . Hypertension Mother   . Hypertension Sister   . Heart disease Sister     Heart Disease before age 35  . Hyperlipidemia Sister   . Anesthesia problems Neg Hx   . Heart disease Brother     Heart Disease before age 39  . Diabetes Neg Hx   . Colon cancer Neg Hx     Surgical History Past Surgical History  Procedure Laterality Date  . Carotid endarterectomy Right 03/2009  . Cholecystectomy    . Tonsillectomy and adenoidectomy    . Knee arthroscopy Left     "I had 3" (11/05/2012)  . Maze  06/14/2011    Procedure: MAZE;  Surgeon: Gaye Pollack, MD;  Location: [facility];  Service: Open Heart Surgery;  Laterality: N/A;  [place] left atrial appendage  . Circumcision    . Femoral-popliteal bypass graft Left 03/06/2012    Procedure: BYPASS GRAFT FEMORAL-POPLITEAL ARTERY;  Surgeon: Angelia Mould, MD;  Location: [facility];  Service: Vascular;  Laterality: Left;  Left Femoral - Below Knee Popliteal Bypass Graft with Vein and Intraoperative Arteriogram.  . Total knee arthroplasty Left   . Lead revision  11/05/2012    pacemaker/notes 11/05/2012  . Coronary artery bypass graft  06/14/2011    Procedure: CORONARY ARTERY BYPASS GRAFTING (CABG);  Surgeon: Gaye Pollack, MD;  Location: [facility];  Service: Open Heart Surgery;  Laterality: N/A;  Coronary Artery Bypass Graft on pump times six;  utilizing internal mammary artery and right greater saphenous vein harvested endoscopically.x 5 vessels  . Insert / replace / remove pacemaker  2013    /notes 11/05/2012-[employer]  . Joint replacement Left     left TKA  . Esophagogastroduodenoscopy (egd) with propofol N/A 02/17/2013    Procedure:  ESOPHAGOGASTRODUODENOSCOPY (EGD) WITH PROPOFOL;  Surgeon: Jerene Bears, MD;  Location: [facility];  Service: Gastroenterology;  Laterality: N/A;  . Balloon dilation N/A 02/17/2013    Procedure: BALLOON DILATION;  Surgeon: Jerene Bears, MD;  Location: [facility];  Service: Gastroenterology;  Laterality: N/A;  . Colonoscopy with propofol N/A 02/17/2013    Procedure: COLONOSCOPY WITH PROPOFOL;  Surgeon: Jerene Bears, MD;  Location: [facility];  Service: Gastroenterology;  Laterality: N/A;    Allergies  Allergen Reactions  . Lipitor [Atorvastatin Calcium] Other (See Comments)    Reaction unknown    Current Outpatient Prescriptions  Medication Sig Dispense Refill  . acarbose (PRECOSE) 50 MG tablet Take 1 tablet (50 mg total) by mouth daily with breakfast.  90 tablet  11  . acetaminophen (TYLENOL) 500 MG tablet Take 500 mg by mouth every 6 (six) hours as needed for mild pain or moderate pain.       Marland Kitchen aspirin 81 MG tablet Take 81 mg by mouth daily.      . carvedilol (COREG) 25 MG tablet Take 1 tablet (25 mg total) by mouth 2 (two) times daily.  180 tablet  3  . diphenhydramine-acetaminophen (TYLENOL PM) 25-500 MG TABS Take 1 tablet by mouth at bedtime as needed (sleep).       . furosemide (LASIX) 40 MG tablet Take 2 tablets (80 mg total) by mouth daily.  30 tablet  3  . furosemide (LASIX) 40 MG tablet TAKE 2 TABLETS BY MOUTH EVERY DAY  180 tablet  3  . glimepiride (AMARYL) 4 MG tablet Take 1 tablet (4 mg total) by mouth daily before breakfast.  90 tablet  3  . Iron Combinations (IRON COMPLEX PO) Take 2 capsules by mouth daily.      Marland Kitchen L-Methylfolate-Algae-B12-B6 (METANX) 3-90.314-2-35 MG CAPS Take 1 capsule by mouth daily.  60 capsule  3  . Liraglutide (VICTOZA) 18 MG/3ML SOPN Inject 1.8 mg into the skin daily.  9 mL  11  . loratadine (CLARITIN) 10 MG tablet Take 10 mg by mouth daily.      Marland Kitchen losartan (COZAAR) 100 MG tablet TAKE 1 TABLET BY MOUTH ONCE DAILY  90 tablet  2  . magnesium gluconate  (MAGONATE) 500 MG tablet Take 500 mg by mouth daily.       . metFORMIN (GLUCOPHAGE) 1000 MG tablet Take 1 tablet by mouth every am and 1 1/2 tablets every pm  225 tablet  0  . Multiple Vitamin (MULTIVITAMIN WITH MINERALS) TABS Take 1 tablet by mouth daily.      Marland Kitchen NOVOTWIST 32G X 5 MM MISC       . omeprazole (PRILOSEC) 20 MG capsule daily.      Glory Rosebush DELICA LANCETS FINE MISC 1 each by other route daily. 1/day 250.62  100 each  3  . ONETOUCH VERIO test strip       . oxymetazoline (AFRIN) 0.05 % nasal spray Place 2 sprays into the nose daily as needed for congestion.      . potassium chloride SA (KLOR-CON M20) 20 MEQ tablet TAKE 1 TABLET BY MOUTH EVERY DAY  90 tablet  2  . pravastatin (PRAVACHOL) 80 MG tablet TAKE 1 TABLET BY MOUTH EVERY DAY  90 tablet  2   No current facility-administered medications for this visit.     REVIEW OF SYSTEMS: See HPI for pertinent positives and negatives.  Physical Examination Filed Vitals:   08/26/13 1528 08/26/13 1531  BP: 171/96 164/95  Pulse: 80 79  Resp:  16  Height:  5' 9.5" (1.765 m)  Weight:  236 lb (107.049 kg)  SpO2:  98%   Body mass index is 34.36 kg/(m^2).  General: A&O x 3, WDWN, obese male.  Gait: normal  Eyes: PERRLA,  Pulmonary: CTAB, without wheezes , rales or rhonchi  Cardiac: regular Rythm , without detected murmur  Carotid Bruits  Left  Right    Negative  Negative   Aorta: is not palpable  Radial pulses: are 2+ and =   VASCULAR EXAM:  Extremities without ischemic changes  without Gangrene; without open wounds.  Both feet are warm and well-perfused. He has moderate bilateral lower extremity swelling.   LE Pulses  LEFT  RIGHT   FEMORAL  2+ palpable  2+ palpable   POPLITEAL  not palpable  not palpable   PT Not palpable Not palpable  DP 2+ palpable 2+palpable   Abdomen: soft, NT, no masses.  Skin: no rashes, no ulcers noted.  Musculoskeletal: no muscle wasting or atrophy.  Neurologic: A&O X 3; Appropriate Affect ;  SENSATION: normal; MOTOR FUNCTION: moving all extremities equally, motor strength 5/5 throughout. Speech is fluent/normal. CN 2-12 intact.     Significant Diagnostic Studies:   Non-Invasive Vascular Imaging (08/26/2013):  CEREBROVASCULAR DUPLEX EVALUATION    INDICATION: Carotid artery disease    PREVIOUS INTERVENTION(S): Right carotid endarterectomy 03/22/2009    DUPLEX EXAM: Carotid duplex    RIGHT  LEFT  Peak Systolic Velocities (cm/s) End Diastolic Velocities (cm/s) Plaque LOCATION Peak Systolic Velocities (cm/s) End Diastolic Velocities (cm/s) Plaque  79 15 - CCA PROXIMAL 84 17 HT  80 13 HT CCA MID 119 24 HT  62 11 HT CCA DISTAL 135 22 HT  155 9 - ECA 90 2 -  57 18 - ICA PROXIMAL 108 25 HT  88 25 - ICA MID 89 27 -  81 27 - ICA DISTAL 77 23 -    N/A ICA / CCA Ratio (PSV) .90  Antegrade abnormal Vertebral Flow Antegrade  865 Brachial Systolic Pressure (mmHg) 784  Triphasic Brachial Artery Waveforms Triphasic    Plaque Morphology:  HM = Homogeneous, HT = Heterogeneous, CP = Calcific Plaque, SP = Smooth Plaque, IP = Irregular Plaque     ADDITIONAL FINDINGS:     IMPRESSION: 1. Patent right carotid endarterectomy site with no evidence for restenosis. 2. Less than 40% left internal carotid artery stenosis.    Compared to the previous exam:  No change.   LOWER EXTREMITY ARTERIAL DUPLEX EVALUATION    INDICATION: Follow up bypass graft    PREVIOUS INTERVENTION(S): Left femoral to below knee popliteal bypass graft 03/06/2012    DUPLEX EXAM: Left lower extremity duplex    RIGHT  LEFT   Peak Systolic Velocity (cm/s) Ratio (if abnormal) Waveform  Peak Systolic Velocity (cm/s) Ratio (if abnormal) Waveform     Inflow Artery 185  T     Proximal Anastomosis 145  T     Proximal Graft 77  T     Mid Graft 101  T      Distal Graft 112  T     Distal Anastomosis 107  T     Outflow Artery 109  T  1.28 Today's ABI / TBI 1.18  1.19 Previous ABI / TBI (  02/20/13) 1.29    Waveform:     M - Monophasic       B - Biphasic       T - Triphasic  If Ankle Brachial Index (ABI) or Toe Brachial Index (TBI) performed, please see complete report  ADDITIONAL FINDINGS:     IMPRESSION: 1. Patent left femoral to popliteal bypass graft with no evidence for restenosis.    Compared to the previous exam:  No change.      ASSESSMENT:  Edward [name] is a 73 y.o. male  who is s/p left femoropopliteal bypass graft with vein on 03/06/2012.  He is also s/p right CEA on 03/22/09.  He denies claudication symptoms, denies non-healing wounds.  He had slurred speech for about a day, in 2012, no further TIA or stroke symptoms.   Carotid Duplex today demonstrates a patent right carotid endarterectomy site with no evidence for restenosis, less than 40% left internal carotid artery stenosis.  Left lower extremity arterial Duplex today demonstrates a patent left femoral to popliteal bypass graft with no evidence for restenosis. His ABI's today are normal with triphasic waveforms throughout.   PLAN:   Based on today's exam and non-invasive vascular lab results, the patient will follow up in 6 months with the following tests ABI's, left LE arterial Duplex, carotid Duplex in 1 year. I discussed in depth with the patient the nature of atherosclerosis, and emphasized the importance of maximal medical management including strict control of blood pressure, blood glucose, and lipid levels, obtaining regular exercise, and cessation of smoking.  The patient is aware that without maximal medical management the underlying atherosclerotic disease process will progress, limiting the benefit of any interventions. The patient was given information about stroke prevention and what symptoms should prompt the patient to seek immediate medical care.  The patient was given information about PAD including signs, symptoms, treatment, what symptoms should prompt the patient to seek immediate medical care, and risk  reduction measures to take. Thank you for allowing [place] to participate in this patient's care.  Clemon Chambers, RN, MSN, FNP-C [facility] Office: [phone]  Clinic MD: Scot Dock 08/26/2013 3:38 PM

## 2013-08-26 NOTE — Addendum Note (Signed)
Addended by: Mena Goes on: 08/26/2013 05:25 PM   Modules accepted: Orders

## 2013-08-26 NOTE — Patient Instructions (Signed)
Peripheral Vascular Disease Peripheral Vascular Disease (PVD), also called Peripheral Arterial Disease (PAD), is a circulation problem caused by cholesterol (atherosclerotic plaque) deposits in the arteries. PVD commonly occurs in the lower extremities (legs) but it can occur in other areas of the body, such as your arms. The cholesterol buildup in the arteries reduces blood flow which can cause pain and other serious problems. The presence of PVD can place a person at risk for Coronary Artery Disease (CAD).  CAUSES  Causes of PVD can be many. It is usually associated with more than one risk factor such as:   High Cholesterol.  Smoking.  Diabetes.  Lack of exercise or inactivity.  High blood pressure (hypertension).  Obesity.  Family history. SYMPTOMS   When the lower extremities are affected, patients with PVD may experience:  Leg pain with exertion or physical activity. This is called INTERMITTENT CLAUDICATION. This may present as cramping or numbness with physical activity. The location of the pain is associated with the level of blockage. For example, blockage at the abdominal level (distal abdominal aorta) may result in buttock or hip pain. Lower leg arterial blockage may result in calf pain.  As PVD becomes more severe, pain can develop with less physical activity.  In people with severe PVD, leg pain may occur at rest.  Other PVD signs and symptoms:  Leg numbness or weakness.  Coldness in the affected leg or foot, especially when compared to the other leg.  A change in leg color.  Patients with significant PVD are more prone to ulcers or sores on toes, feet or legs. These may take longer to heal or may reoccur. The ulcers or sores can become infected.  If signs and symptoms of PVD are ignored, gangrene may occur. This can result in the loss of toes or loss of an entire limb.  Not all leg pain is related to PVD. Other medical conditions can cause leg pain such  as:  Blood clots (embolism) or Deep Vein Thrombosis.  Inflammation of the blood vessels (vasculitis).  Spinal stenosis. DIAGNOSIS  Diagnosis of PVD can involve several different types of tests. These can include:  Pulse Volume Recording Method (PVR). This test is simple, painless and does not involve the use of X-rays. PVR involves measuring and comparing the blood pressure in the arms and legs. An ABI (Ankle-Brachial Index) is calculated. The normal ratio of blood pressures is 1. As this number becomes smaller, it indicates more severe disease.  < 0.95 - indicates significant narrowing in one or more leg vessels.  <0.8 - there will usually be pain in the foot, leg or buttock with exercise.  <0.4 - will usually have pain in the legs at rest.  <0.25 - usually indicates limb threatening PVD.  Doppler detection of pulses in the legs. This test is painless and checks to see if you have a pulses in your legs/feet.  A dye or contrast material (a substance that highlights the blood vessels so they show up on x-ray) may be given to help your caregiver better see the arteries for the following tests. The dye is eliminated from your body by the kidney's. Your caregiver may order blood work to check your kidney function and other laboratory values before the following tests are performed:  Magnetic Resonance Angiography (MRA). An MRA is a picture study of the blood vessels and arteries. The MRA machine uses a large magnet to produce images of the blood vessels.  Computed Tomography Angiography (CTA). A CTA  is a specialized x-ray that looks at how the blood flows in your blood vessels. An IV may be inserted into your arm so contrast dye can be injected.  Angiogram. Is a procedure that uses x-rays to look at your blood vessels. This procedure is minimally invasive, meaning a small incision (cut) is made in your groin. A small tube (catheter) is then inserted into the artery of your groin. The catheter  is guided to the blood vessel or artery your caregiver wants to examine. Contrast dye is injected into the catheter. X-rays are then taken of the blood vessel or artery. After the images are obtained, the catheter is taken out. TREATMENT  Treatment of PVD involves many interventions which may include:  Lifestyle changes:  Quitting smoking.  Exercise.  Following a low fat, low cholesterol diet.  Control of diabetes.  Foot care is very important to the PVD patient. Good foot care can help prevent infection.  Medication:  Cholesterol-lowering medicine.  Blood pressure medicine.  Anti-platelet drugs.  Certain medicines may reduce symptoms of Intermittent Claudication.  Interventional/Surgical options:  Angioplasty. An Angioplasty is a procedure that inflates a balloon in the blocked artery. This opens the blocked artery to improve blood flow.  Stent Implant. A wire mesh tube (stent) is placed in the artery. The stent expands and stays in place, allowing the artery to remain open.  Peripheral Bypass Surgery. This is a surgical procedure that reroutes the blood around a blocked artery to help improve blood flow. This type of procedure may be performed if Angioplasty or stent implants are not an option. SEEK IMMEDIATE MEDICAL CARE IF:   You develop pain or numbness in your arms or legs.  Your arm or leg turns cold, becomes blue in color.  You develop redness, warmth, swelling and pain in your arms or legs. MAKE SURE YOU:   Understand these instructions.  Will watch your condition.  Will get help right away if you are not doing well or get worse. Document Released: 02/09/2004 Document Revised: 03/26/2011 Document Reviewed: 01/06/2008 [facility] Patient Information 2015 [place], [place]. This information is not intended to replace advice given to you by your health care provider. Make sure you discuss any questions you have with your health care provider.   Stroke  Prevention Some medical conditions and behaviors are associated with an increased chance of having a stroke. You may prevent a stroke by making healthy choices and managing medical conditions. HOW CAN I REDUCE MY RISK OF HAVING A STROKE?   Stay physically active. Get at least 30 minutes of activity on most or all days.  Do not smoke. It may also be helpful to avoid exposure to secondhand smoke.  Limit alcohol use. Moderate alcohol use is considered to be:  No more than 2 drinks per day for men.  No more than 1 drink per day for nonpregnant women.  Eat healthy foods. This involves:  Eating 5 or more servings of fruits and vegetables a day.  Making dietary changes that address high blood pressure (hypertension), high cholesterol, diabetes, or obesity.  Manage your cholesterol levels.  Making food choices that are high in fiber and low in saturated fat, trans fat, and cholesterol may control cholesterol levels.  Take any prescribed medicines to control cholesterol as directed by your health care provider.  Manage your diabetes.  Controlling your carbohydrate and sugar intake is recommended to manage diabetes.  Take any prescribed medicines to control diabetes as directed by your health care provider.  Control your hypertension.  Making food choices that are low in salt (sodium), saturated fat, trans fat, and cholesterol is recommended to manage hypertension.  Take any prescribed medicines to control hypertension as directed by your health care provider.  Maintain a healthy weight.  Reducing calorie intake and making food choices that are low in sodium, saturated fat, trans fat, and cholesterol are recommended to manage weight.  Stop drug abuse.  Avoid taking birth control pills.  Talk to your health care provider about the risks of taking birth control pills if you are over 44 years old, smoke, get migraines, or have ever had a blood clot.  Get evaluated for sleep  disorders (sleep apnea).  Talk to your health care provider about getting a sleep evaluation if you snore a lot or have excessive sleepiness.  Take medicines only as directed by your health care provider.  For some people, aspirin or blood thinners (anticoagulants) are helpful in reducing the risk of forming abnormal blood clots that can lead to stroke. If you have the irregular heart rhythm of atrial fibrillation, you should be on a blood thinner unless there is a good reason you cannot take them.  Understand all your medicine instructions.  Make sure that other conditions (such as anemia or atherosclerosis) are addressed. SEEK IMMEDIATE MEDICAL CARE IF:   You have sudden weakness or numbness of the face, arm, or leg, especially on one side of the body.  Your face or eyelid droops to one side.  You have sudden confusion.  You have trouble speaking (aphasia) or understanding.  You have sudden trouble seeing in one or both eyes.  You have sudden trouble walking.  You have dizziness.  You have a loss of balance or coordination.  You have a sudden, severe headache with no known cause.  You have new chest pain or an irregular heartbeat. Any of these symptoms may represent a serious problem that is an emergency. Do not wait to see if the symptoms will go away. Get medical help at once. Call your local emergency services (911 in [place] Do not drive yourself to the hospital. Document Released: 02/09/2004 Document Revised: 05/18/2013 Document Reviewed: 07/04/2012 [facility] Patient Information 2015 [place], [place]. This information is not intended to replace advice given to you by your health care provider. Make sure you discuss any questions you have with your health care provider.

## 2013-08-28 ENCOUNTER — Encounter: Payer: Self-pay | Admitting: Endocrinology

## 2013-08-30 ENCOUNTER — Encounter: Payer: Self-pay | Admitting: Internal Medicine

## 2013-08-31 ENCOUNTER — Encounter: Payer: Self-pay | Admitting: Internal Medicine

## 2013-08-31 NOTE — Telephone Encounter (Signed)
Please inform the patient this test was ordered by Dr. Melvyn Novas, with [facility], not me He can contact Dr. Melvyn Novas regarding performing this test again Thanks

## 2013-08-31 NOTE — Telephone Encounter (Signed)
Please advise MW thanks

## 2013-09-01 ENCOUNTER — Encounter: Payer: Self-pay | Admitting: Internal Medicine

## 2013-09-01 NOTE — Telephone Encounter (Signed)
lmtcb x1 

## 2013-09-02 ENCOUNTER — Telehealth: Payer: Self-pay | Admitting: Internal Medicine

## 2013-09-02 NOTE — Telephone Encounter (Signed)
Spoke with patient-per Emails between him and MW patient needs to have 6 mw and see MW after that. Pt has been scheduled for Thursday 09/10/13 at 10:30am for 6MW and see  MW at 11:15am for ROV. Nothing more needed at this time.

## 2013-09-03 ENCOUNTER — Other Ambulatory Visit: Payer: Self-pay

## 2013-09-03 ENCOUNTER — Other Ambulatory Visit (INDEPENDENT_AMBULATORY_CARE_PROVIDER_SITE_OTHER): Payer: Federal, State, Local not specified - PPO

## 2013-09-03 DIAGNOSIS — E118 Type 2 diabetes mellitus with unspecified complications: Secondary | ICD-10-CM

## 2013-09-03 LAB — HEMOGLOBIN A1C: Hgb A1c MFr Bld: 6.9 % — ABNORMAL HIGH (ref 4.6–6.5)

## 2013-09-09 ENCOUNTER — Encounter: Payer: Self-pay | Admitting: Internal Medicine

## 2013-09-10 ENCOUNTER — Ambulatory Visit (INDEPENDENT_AMBULATORY_CARE_PROVIDER_SITE_OTHER): Payer: Federal, State, Local not specified - PPO | Admitting: Internal Medicine

## 2013-09-10 ENCOUNTER — Ambulatory Visit: Payer: Federal, State, Local not specified - PPO | Admitting: Endocrinology

## 2013-09-10 ENCOUNTER — Encounter: Payer: Self-pay | Admitting: Internal Medicine

## 2013-09-10 ENCOUNTER — Encounter: Payer: Self-pay | Admitting: *Deleted

## 2013-09-10 VITALS — BP 160/98 | HR 67 | Temp 97.9°F | Ht 69.5 in | Wt 242.2 lb

## 2013-09-10 DIAGNOSIS — R0600 Dyspnea, unspecified: Secondary | ICD-10-CM

## 2013-09-10 DIAGNOSIS — R0989 Other specified symptoms and signs involving the circulatory and respiratory systems: Secondary | ICD-10-CM

## 2013-09-10 DIAGNOSIS — R0609 Other forms of dyspnea: Secondary | ICD-10-CM

## 2013-09-10 DIAGNOSIS — I6529 Occlusion and stenosis of unspecified carotid artery: Secondary | ICD-10-CM

## 2013-09-10 NOTE — Progress Notes (Signed)
6MW done today. Crosby Bing, CMA

## 2013-09-10 NOTE — Assessment & Plan Note (Addendum)
-   spirometry 11/21/2012 > FEV1  2.29 (67%) ratio 74  - 11/21/2012  Walked RA x 3 laps @ 185 ft each stopped due to end of study, rapid pace, no desats   I had an extended summary discussion with the patient today lasting 15 to 20 minutes of a 25 minute visit on the following issues:  spriometry  C/w Restrictive changes c/w body habitus c/w central obesity > rec wt loss, no meds   Calorie balance issues addressed   Lab Results  Component Value Date   TSH 3.68 12/03/2012     Pulmonary f/u is prn

## 2013-09-10 NOTE — Progress Notes (Signed)
Subjective:     Patient ID: Edward [name], male   DOB: October 02, 1940   MRN: [id number]   Brief patient profile:  78 yowm quit smoking 1981 @ wt around 210 with apparent  Onset of breathing difficult late spring  2013  "thought it was his heart"  but had cabg 06/14/11 at wt around 225  and no improvement to date in doe with no def cardiac source identified by Dr Caryl Comes  so self referred to pulmonary clinic 11/21/2012 .   History of Present Illness  11/21/2012 1st [facility] office visit/ [place] Edward [name] cc sob x exertion x steps x one flight at home does it s stopping but short of breath at top.  Does ok at [employer], does downhill to mailbox fine but uphill to house from  [place] has to go slow or has to stop, seems to be getting worse gradually over time. Never occurs at rest or supine/ sleeping.  rec Prilosec 20 mg take  X  2 x 30 min before breakfast and Pepcid 20 mg at bedtime consistently until return GERD diet W/u for Fe def> Dr Hilarie Fredrickson eval and rx (see emr)    06/24/2013 f/u ov/Edward [name] re: doe/ resolved to pt's satisfaction on gerd rx/ no pulmonary meds Chief Complaint  Patient presents with  . Follow-up    Pt here to followup to discuss 6MW test. He denies any respiratory co's today.   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 Omeprazole should be Take 30-60 min before first meal of the day   09/10/2013 f/u ov/Edward [name] re: doe resolved to his satisfaction/ cough resolved since taking omeprazole q am  Chief Complaint  Patient presents with  . Follow-up    had 6 min. walk, no sob,no cough     Not limited by breathing from desired activities / no need for inhaers    No  obvious day to day or daytime variabilty or assoc cp or chest tightness, subjective wheeze overt sinus or hb symptoms. No unusual exp hx or h/o childhood pna/ asthma or knowledge of premature birth.  Sleeping ok without nocturnal  or early am exacerbation  of respiratory  c/o's or need for noct saba. Also denies any obvious fluctuation of symptoms with weather or environmental changes or other aggravating or alleviating factors except as outlined above   Current Medications, Allergies, Complete Past Medical History, Past Surgical History, Family History, and Social History were reviewed in [employer] record.  ROS  The following are not active complaints unless bolded sore throat, dysphagia, dental problems, itching, sneezing,  nasal congestion or excess/ purulent secretions, ear ache,   fever, chills, sweats, unintended wt loss, pleuritic or exertional cp, hemoptysis,  orthopnea pnd or leg swelling, presyncope, palpitations, heartburn, abdominal pain, anorexia, nausea, vomiting, diarrhea  or change in bowel or urinary habits, change in stools or urine, dysuria,hematuria,  rash, arthralgias, visual complaints, headache, numbness weakness or ataxia or problems with walking or coordination,  change in mood/affect or memory.                Objective:   Physical Exam   amb wm nad  06/24/13           242 Wt Readings from Last 3 Encounters:  11/21/12 243 lb (110.224 kg)  11/06/12 249 lb 1.9 oz (113 kg)  11/06/12 249 lb 1.9 oz (113 kg)     HEENT: nl dentition, turbinates, and orophanx. Nl external ear canals without cough reflex   NECK :  without JVD/Nodes/TM/ nl carotid upstrokes bilaterally   LUNGS: no acc muscle use, pseudowheeze resolved     CV:  RRR  no s3 or murmur or increase in P2, no edema   ABD:  Quite obese but soft and nontender with nl excursion in the supine position. No bruits or organomegaly, bowel sounds nl  MS:  warm without deformities, calf  tenderness, cyanosis or clubbing  SKIN: warm and dry without lesions        cxr10/23/14  No pneumothorax. Evaluation of the  retrosternal clear space is obscured secondary to overlying osseous  and soft tissue structures. The previously questioned nodule  overlying the right hilum has resolved in the interval. There is  grossly unchanged mild slightly nodular thickening of the pulmonary  interstitium. No focal airspace opacities. No pleural effusion or  pneumothorax. No evidence of edema.      Assessment:

## 2013-09-10 NOTE — Assessment & Plan Note (Signed)
-   spirometry 11/21/2012 > FEV1  2.29 (67%) ratio 74  - 11/21/2012  Walked RA x 3 laps @ 185 ft each stopped due to end of study, rapid pace, no desats  - 06/24/13 6 m walk 270 m ("I didn't know I was supposed to go as fast as I could") with no desats - 09/10/2013 456 s desats/difficulty

## 2013-09-10 NOTE — Patient Instructions (Addendum)
Your only pulmonary problem is carrying extra weight below your diaphragm so keep working on weight loss  Pulmonary follow up is as needed

## 2013-09-22 ENCOUNTER — Other Ambulatory Visit (INDEPENDENT_AMBULATORY_CARE_PROVIDER_SITE_OTHER): Payer: Federal, State, Local not specified - PPO

## 2013-09-22 ENCOUNTER — Other Ambulatory Visit: Payer: Self-pay

## 2013-09-22 DIAGNOSIS — E118 Type 2 diabetes mellitus with unspecified complications: Secondary | ICD-10-CM

## 2013-09-22 LAB — HEMOGLOBIN A1C: Hgb A1c MFr Bld: 6.9 % — ABNORMAL HIGH (ref 4.6–6.5)

## 2013-09-23 ENCOUNTER — Ambulatory Visit (INDEPENDENT_AMBULATORY_CARE_PROVIDER_SITE_OTHER): Payer: Federal, State, Local not specified - PPO | Admitting: Endocrinology

## 2013-09-23 ENCOUNTER — Encounter: Payer: Self-pay | Admitting: Endocrinology

## 2013-09-23 VITALS — BP 132/82 | HR 97 | Temp 98.2°F | Ht 69.5 in | Wt 238.0 lb

## 2013-09-23 DIAGNOSIS — I6529 Occlusion and stenosis of unspecified carotid artery: Secondary | ICD-10-CM

## 2013-09-23 DIAGNOSIS — E118 Type 2 diabetes mellitus with unspecified complications: Secondary | ICD-10-CM

## 2013-09-23 NOTE — Patient Instructions (Addendum)
check your blood sugar once a day.  vary the time of day when you check, between before the 3 meals, and at bedtime.  also check if you have symptoms of your blood sugar being too high or too low.  please keep a record of the readings and bring it to your next appointment here.  You can write it on any piece of paper.  please call us sooner if your blood sugar goes below 70, or if you have a lot of readings over 200.   Please continue the same medications.   Please come back for a follow-up appointment in 4-6 months.

## 2013-09-23 NOTE — Progress Notes (Signed)
Subjective:    Patient ID: Edward [name], male    DOB: 12-31-1940, 73 y.o.   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  pt states he feels well in general.  no cbg record, but states cbg's are well-controlled.   Past Medical History  Diagnosis Date  . A-fib   . HTN (hypertension)   . Dyslipidemia   . PVD (peripheral vascular disease)     s/p Right CEA March, 2011,   . Claudication   . Hyperlipidemia   . GERD (gastroesophageal reflux disease)   . Diabetic neuropathy     mostly feet/ legs  . Pacemaker   . Stroke     ?  Marland Kitchen Anemia     ?  . Diastolic CHF 7/0/7867  . Exertional shortness of breath   . Type II diabetes mellitus   . Neuromuscular disorder     neuropathy  . Coronary artery disease     Past Surgical History  Procedure Laterality Date  . Carotid endarterectomy Right 03/2009  . Cholecystectomy    . Tonsillectomy and adenoidectomy    . Knee arthroscopy Left     "I had 3" (11/05/2012)  . Maze  06/14/2011    Procedure: MAZE;  Surgeon: Gaye Pollack, MD;  Location: [place];  Service: Open Heart Surgery;  Laterality: N/A;  Ligate left atrial appendage  . Circumcision    . Femoral-popliteal bypass graft Left 03/06/2012    Procedure: BYPASS GRAFT FEMORAL-POPLITEAL ARTERY;  Surgeon: Angelia Mould, MD;  Location: [facility];  Service: Vascular;  Laterality: Left;  Left Femoral - Below Knee Popliteal Bypass Graft with Vein and Intraoperative Arteriogram.  . Total knee arthroplasty Left   . Lead revision  11/05/2012    pacemaker/notes 11/05/2012  . Coronary artery bypass graft  06/14/2011    Procedure: CORONARY ARTERY BYPASS GRAFTING (CABG);  Surgeon: Gaye Pollack, MD;  Location: [place];   Service: Open Heart Surgery;  Laterality: N/A;  Coronary Artery Bypass Graft on pump times six;  utilizing internal mammary artery and right greater saphenous vein harvested endoscopically.x 5 vessels  . Insert / replace / remove pacemaker  2013    /notes 11/05/2012-Medtronic  . Joint replacement Left     left TKA  . Esophagogastroduodenoscopy (egd) with propofol N/A 02/17/2013    Procedure: ESOPHAGOGASTRODUODENOSCOPY (EGD) WITH PROPOFOL;  Surgeon: Jerene Bears, MD;  Location[facility];  Service: Gastroenterology;  Laterality: N/A;  . Balloon dilation N/A 02/17/2013    Procedure: BALLOON DILATION;  Surgeon: Jerene Bears, MD;  Location: [facility];  Service: Gastroenterology;  Laterality: N/A;  . Colonoscopy with propofol N/A 02/17/2013    Procedure: COLONOSCOPY WITH PROPOFOL;  Surgeon: Jerene Bears, MD;  Location: [facility];  Service: Gastroenterology;  Laterality: N/A;    History   Social History  . Marital Status: Married    Spouse Name: N/A    Number of Children: N/A  . Years of Education: N/A   Occupational History  . Not on file.   Social History Main Topics  . Smoking status: Former Smoker -- 3.00 packs/day for 32 years    Types: Cigarettes    Quit date:  01/16/1979  . Smokeless tobacco: Never Used  . Alcohol Use: 1.2 oz/week    2 Glasses of wine per week     Comment: beer 1-2 weekly (occasional)  . Drug Use: No  . Sexual Activity: Not Currently   Other Topics Concern  . Not on file   Social History Narrative  . No narrative on file    Current Outpatient Prescriptions on File Prior to Visit  Medication Sig Dispense Refill  . acetaminophen (TYLENOL) 500 MG tablet Take 500 mg by mouth every 6 (six) hours as needed for mild pain or moderate pain.       Marland Kitchen aspirin 81 MG tablet Take 81 mg by mouth daily.      . carvedilol (COREG) 25 MG tablet Take 1 tablet (25 mg total) by mouth 2 (two) times daily.  180 tablet  3  . diphenhydramine-acetaminophen (TYLENOL PM) 25-500  MG TABS Take 1 tablet by mouth at bedtime as needed (sleep).       . furosemide (LASIX) 40 MG tablet Take 2 tablets (80 mg total) by mouth daily.  30 tablet  3  . glimepiride (AMARYL) 4 MG tablet Take 1 tablet (4 mg total) by mouth daily before breakfast.  90 tablet  3  . Iron Combinations (IRON COMPLEX PO) Take 2 capsules by mouth daily.      Marland Kitchen L-Methylfolate-Algae-B12-B6 (METANX) 3-90.314-2-35 MG CAPS Take 1 capsule by mouth daily.  60 capsule  3  . Liraglutide (VICTOZA) 18 MG/3ML SOPN Inject 1.8 mg into the skin daily.  9 mL  11  . loratadine (CLARITIN) 10 MG tablet Take 10 mg by mouth daily.      Marland Kitchen losartan (COZAAR) 100 MG tablet TAKE 1 TABLET BY MOUTH ONCE DAILY  90 tablet  2  . magnesium gluconate (MAGONATE) 500 MG tablet Take 500 mg by mouth daily.       . metFORMIN (GLUCOPHAGE) 1000 MG tablet Take 1 tablet by mouth every am and 1 1/2 tablets every pm  225 tablet  0  . Multiple Vitamin (MULTIVITAMIN WITH MINERALS) TABS Take 1 tablet by mouth daily.      Marland Kitchen NOVOTWIST 32G X 5 MM MISC       . omeprazole (PRILOSEC) 20 MG capsule daily.      Glory Rosebush DELICA LANCETS FINE MISC 1 each by other route daily. 1/day 250.62  100 each  3  . ONETOUCH VERIO test strip       . oxymetazoline (AFRIN) 0.05 % nasal spray Place 2 sprays into the nose daily as needed for congestion.      . potassium chloride SA (KLOR-CON M20) 20 MEQ tablet TAKE 1 TABLET BY MOUTH EVERY DAY  90 tablet  2  . pravastatin (PRAVACHOL) 80 MG tablet TAKE 1 TABLET BY MOUTH EVERY DAY  90 tablet  2   No current facility-administered medications on file prior to visit.    Allergies  Allergen Reactions  . Lipitor [Atorvastatin Calcium] Other (See Comments)    Reaction unknown    Family History  Problem Relation Age of Onset  . Coronary artery disease Father   . Hypertension Father   . Heart disease Father     Heart Disease before age 25  . Heart attack Mother   . Coronary artery disease Mother   . Deep vein thrombosis Mother    . Heart disease Mother     Heart Disease before age 1  . Hyperlipidemia Mother   . Hypertension Mother   .  Hypertension Sister   . Heart disease Sister     Heart Disease before age 70  . Hyperlipidemia Sister   . Anesthesia problems Neg Hx   . Heart disease Brother     Heart Disease before age 75  . Diabetes Neg Hx   . Colon cancer Neg Hx     BP 132/82  Pulse 97  Temp(Src) 98.2 F (36.8 C) (Oral)  Ht 5' 9.5" (1.765 m)  Wt 238 lb (107.956 kg)  BMI 34.65 kg/m2  SpO2 97%    Review of Systems He denies hypoglycemia and weight change    Objective:   Physical Exam VITAL SIGNS:  See vs page. GENERAL: no distress. EXTEMITIES: no deformity. 2+ bilat leg edema. Old healed surgical scars (left TKR, and left leg arterial bypass).   SKIN: no ulcer on the feet. normal color and temp PULSES: dorsalis pedis intact bilat.  NEURO: cn 2-12 grossly intact. readily moves all 4's. sensation is intact to touch on the feet, but decreased from normal.    Lab Results  Component Value Date   HGBA1C 6.9* 09/22/2013        Assessment & Plan:  DM: well-controlled   Patient is advised the following: Patient Instructions  check your blood sugar once a day.  vary the time of day when you check, between before the 3 meals, and at bedtime.  also check if you have symptoms of your blood sugar being too high or too low.  please keep a record of the readings and bring it to your next appointment here.  You can write it on any piece of paper.  please call us sooner if your blood sugar goes below 70, or if you have a lot of readings over 200.   Please continue the same medications.   Please come back for a follow-up appointment in 4-6 months.

## 2013-09-25 ENCOUNTER — Ambulatory Visit: Payer: Federal, State, Local not specified - PPO | Admitting: Endocrinology

## 2013-10-11 ENCOUNTER — Encounter: Payer: Self-pay | Admitting: Internal Medicine

## 2013-10-11 NOTE — Progress Notes (Signed)
Subjective:     Patient ID: Edward [name], male   DOB: 05-15-1940 MRN: [id number]  HPI  89 yowm quit smoking 1981 @ wt around 210 with apparent  Onset of breathing difficult late spring  2013  "thought it was his heart"  but had cabg 06/14/11 at wt around 225  and no improvement to date in doe with no def cardiac source identified by Dr Caryl Comes  so self referred to pulmonary clinic 11/21/2012 .  11/21/2012 1st [facility] office visit/ Kaityln Kallstrom cc sob x exertion x steps x one flight at home does it s stopping but short of breath at top.  Does ok at [employer], does downhill to mailbox fine but uphill to house from  [place] has to go slow or has to stop, seems to be getting worse gradually over time. Never occurs at rest or supine/ sleeping.     06/23/13 6 m walk study       Objective:        Assessment:

## 2013-10-11 NOTE — Assessment & Plan Note (Signed)
-   spirometry 11/21/2012 > FEV1  2.29 (67%) ratio 74  - 11/21/2012  Walked RA x 3 laps @ 185 ft each stopped due to end of study, rapid pace, no desats  - 06/24/13 6 m walk 270 m ("I didn't know I was supposed to go as fast as I could") with no desats

## 2013-11-01 ENCOUNTER — Encounter: Payer: Self-pay | Admitting: Internal Medicine

## 2013-11-02 ENCOUNTER — Telehealth: Payer: Self-pay | Admitting: Internal Medicine

## 2013-11-02 NOTE — Telephone Encounter (Signed)
Walk in pt Form " Sealed Envelope" gave to [facility]

## 2013-11-10 ENCOUNTER — Ambulatory Visit (INDEPENDENT_AMBULATORY_CARE_PROVIDER_SITE_OTHER): Payer: Federal, State, Local not specified - PPO | Admitting: Internal Medicine

## 2013-11-10 ENCOUNTER — Encounter: Payer: Self-pay | Admitting: Internal Medicine

## 2013-11-10 VITALS — BP 146/96 | HR 96 | Ht 69.5 in | Wt 240.0 lb

## 2013-11-10 DIAGNOSIS — I480 Paroxysmal atrial fibrillation: Secondary | ICD-10-CM

## 2013-11-10 DIAGNOSIS — I495 Sick sinus syndrome: Secondary | ICD-10-CM

## 2013-11-10 DIAGNOSIS — I5030 Unspecified diastolic (congestive) heart failure: Secondary | ICD-10-CM

## 2013-11-10 LAB — MDC_IDC_ENUM_SESS_TYPE_INCLINIC
Battery Remaining Longevity: 113 mo
Battery Voltage: 2.78 V
Brady Statistic AS VP Percent: 6 %
Date Time Interrogation Session: 20151027145957
Lead Channel Impedance Value: 452 Ohm
Lead Channel Pacing Threshold Amplitude: 0.75 V
Lead Channel Pacing Threshold Amplitude: 2 V
Lead Channel Pacing Threshold Pulse Width: 0.4 ms
Lead Channel Sensing Intrinsic Amplitude: 11.2 mV
Lead Channel Setting Pacing Amplitude: 4 V
Lead Channel Setting Pacing Pulse Width: 0.4 ms
Lead Channel Setting Sensing Sensitivity: 2.8 mV
MDC IDC MSMT BATTERY IMPEDANCE: 186 Ohm
MDC IDC MSMT LEADCHNL RA PACING THRESHOLD PULSEWIDTH: 0.76 ms
MDC IDC MSMT LEADCHNL RV IMPEDANCE VALUE: 690 Ohm
MDC IDC SET LEADCHNL RV PACING AMPLITUDE: 2.5 V
MDC IDC STAT BRADY AP VP PERCENT: 57 %
MDC IDC STAT BRADY AP VS PERCENT: 32 %
MDC IDC STAT BRADY AS VS PERCENT: 5 %

## 2013-11-10 MED ORDER — APIXABAN 5 MG PO TABS: 5.0000 mg | ORAL_TABLET | Freq: Two times a day (BID) | ORAL | Status: DC

## 2013-11-10 NOTE — Patient Instructions (Addendum)
Your physician has recommended you make the following change in your medication:  1) STOP Aspirin 2) START Eliquis 5 mg twice a day  Your physician recommends that you schedule a follow-up appointment in: 6 months with device clinic.   Your physician wants you to follow-up in: 1 year with Dr. Caryl Comes.  You will receive a reminder letter in the mail two months in advance. If you don't receive a letter, please call our office to schedule the follow-up appointment.

## 2013-11-10 NOTE — Progress Notes (Signed)
Patient Care Team: Darlyne Russian, MD as PCP - General (Family Medicine) Angelia Mould, MD as Attending Physician (Vascular Surgery) [name], MD as Attending Physician (Cardiology) Gaye Pollack, MD as Attending Physician (Cardiothoracic Surgery) Deboraha Sprang, MD as Attending Physician (Cardiology) Darlyne Russian, MD as Consulting Physician (Family Medicine)   HPI  Edward [name] is a 73 y.o. male Seen in followup for pacemaker implanted for sinus node dysfunction and continue rapid junctional rhythm. This emerged post bypass surgery with concomitant maze.  Complaints of dyspnea on exertion were improved with  Diuretics along with  Edema.   We undertook pacing for atrial lead revision. It turned out that there is significantly compartmentalized atrial activity and we finally were able to find a site we can capture in case the atrium; there was no significant interval improvement.   He is here today because of a letter from the [facility] medical clearance and assess to address 2 issues. As best as I can interpret from the letter  these regard 1-the presence or absence of atrial fibrillation #2-indication of anticoagulation. I  overall he feels well. There is no significant edema.     Past Medical History  Diagnosis Date  . A-fib   . HTN (hypertension)   . Dyslipidemia   . PVD (peripheral vascular disease)     s/p Right CEA March, 2011,   . Claudication   . Hyperlipidemia   . GERD (gastroesophageal reflux disease)   . Diabetic neuropathy     mostly feet/ legs  . Pacemaker   . Stroke     ?  Marland Kitchen Anemia     ?  . Diastolic CHF 06/20/2945  . Exertional shortness of breath   . Type II diabetes mellitus   . Neuromuscular disorder     neuropathy  . Coronary artery disease     Past Surgical History  Procedure Laterality Date  . Carotid endarterectomy Right 03/2009  . Cholecystectomy    . Tonsillectomy and adenoidectomy    . Knee arthroscopy Left     "I had  3" (11/05/2012)  . Maze  06/14/2011    Procedure: MAZE;  Surgeon: Gaye Pollack, MD;  Location: [place];  Service: Open Heart Surgery;  Laterality: N/A;  Ligate left atrial appendage  . Circumcision    . Femoral-popliteal bypass graft Left 03/06/2012    Procedure: BYPASS GRAFT FEMORAL-POPLITEAL ARTERY;  Surgeon: Angelia Mould, MD;  Location: [facility] OR;  Service: Vascular;  Laterality: Left;  Left Femoral - Below Knee Popliteal Bypass Graft with Vein and Intraoperative Arteriogram.  . Total knee arthroplasty Left   . Lead revision  11/05/2012    pacemaker/notes 11/05/2012  . Coronary artery bypass graft  06/14/2011    Procedure: CORONARY ARTERY BYPASS GRAFTING (CABG);  Surgeon: Gaye Pollack, MD;  Location: [place];  Service: Open Heart Surgery;  Laterality: N/A;  Coronary Artery Bypass Graft on pump times six;  utilizing internal mammary artery and right greater saphenous vein harvested endoscopically.x 5 vessels  . Insert / replace / remove pacemaker  2013    /notes 11/05/2012-Medtronic  . Joint replacement Left     left TKA  . Esophagogastroduodenoscopy (egd) with propofol N/A 02/17/2013    Procedure: ESOPHAGOGASTRODUODENOSCOPY (EGD) WITH PROPOFOL;  Surgeon: Jerene Bears, MD;  [facility];  Service: Gastroenterology;  Laterality: N/A;  . Balloon dilation N/A 02/17/2013    Procedure: BALLOON DILATION;  Surgeon: Lajuan Lines Pyrtle,  MD;  Location: [facility];  Service: Gastroenterology;  Laterality: N/A;  . Colonoscopy with propofol N/A 02/17/2013    Procedure: COLONOSCOPY WITH PROPOFOL;  Surgeon: Jerene Bears, MD;  [facility];  Service: Gastroenterology;  Laterality: N/A;    Current Outpatient Prescriptions  Medication Sig Dispense Refill  . acetaminophen (TYLENOL) 500 MG tablet Take 500 mg by mouth every 6 (six) hours as needed for mild pain or moderate pain.       Marland Kitchen aspirin 81 MG tablet Take 81 mg by mouth daily.      . carvedilol (COREG) 25 MG tablet Take 1 tablet (25 mg  total) by mouth 2 (two) times daily.  180 tablet  3  . diphenhydramine-acetaminophen (TYLENOL PM) 25-500 MG TABS Take 1 tablet by mouth at bedtime as needed (sleep).       . furosemide (LASIX) 40 MG tablet Take 2 tablets (80 mg total) by mouth daily.  30 tablet  3  . glimepiride (AMARYL) 4 MG tablet Take 1 tablet (4 mg total) by mouth daily before breakfast.  90 tablet  3  . Iron Combinations (IRON COMPLEX PO) Take 2 capsules by mouth daily.      Marland Kitchen L-Methylfolate-Algae-B12-B6 (METANX) 3-90.314-2-35 MG CAPS Take 1 capsule by mouth daily.  60 capsule  3  . Liraglutide (VICTOZA) 18 MG/3ML SOPN Inject 1.8 mg into the skin daily.  9 mL  11  . loratadine (CLARITIN) 10 MG tablet Take 10 mg by mouth daily.      Marland Kitchen losartan (COZAAR) 100 MG tablet TAKE 1 TABLET BY MOUTH ONCE DAILY  90 tablet  2  . magnesium gluconate (MAGONATE) 500 MG tablet Take 500 mg by mouth daily.       . metFORMIN (GLUCOPHAGE) 1000 MG tablet Take 1 tablet by mouth every am and 1 1/2 tablets every pm  225 tablet  0  . Multiple Vitamin (MULTIVITAMIN WITH MINERALS) TABS Take 1 tablet by mouth daily.      Marland Kitchen NOVOTWIST 32G X 5 MM MISC       . omeprazole (PRILOSEC) 20 MG capsule daily.      Glory Rosebush DELICA LANCETS FINE MISC 1 each by other route daily. 1/day 250.62  100 each  3  . ONETOUCH VERIO test strip       . oxymetazoline (AFRIN) 0.05 % nasal spray Place 2 sprays into the nose daily as needed for congestion.      . potassium chloride SA (KLOR-CON M20) 20 MEQ tablet TAKE 1 TABLET BY MOUTH EVERY DAY  90 tablet  2  . pravastatin (PRAVACHOL) 80 MG tablet TAKE 1 TABLET BY MOUTH EVERY DAY  90 tablet  2   No current facility-administered medications for this visit.    Allergies  Allergen Reactions  . Lipitor [Atorvastatin Calcium] Other (See Comments)    Reaction unknown    Review of Systems negative except from HPI and PMH  Physical Exam BP 146/96  Pulse 96  Ht 5' 9.5" (1.765 m)  Wt 240 lb (108.863 kg)  BMI 34.95  kg/m2 Well developed and well nourished in no acute distress HENT normal E scleral and icterus clear Neck Supple JVP flat; carotids brisk and full Clear to ausculation Regular rate and rhythm, no murmurs gallops or rub Soft with active bowel sounds No clubbing cyanosis  Edema Alert and oriented, grossly normal motor and sensory function Skin Warm and Dry    Assessment and  Plan Sinus node dysfunction  Junctional rhythm  Pacemaker-Medtronic  Atrial fibrillation  Ischemic heart disease with prior bypass  Left atrial appendage ligatiHolter monitoring from 5/15 was reviewed. It was interpreted as atrial fibrillation. I think there is some evidence of atrial pacing. There other times when atrial fibrillation cannot be excluded.    Interrogation of his pacemaker reveals some evidence of atrial high rate events. However, I am not sure based on atrial evaluation that these are real. His atrial electrogram monitor was programmed on. This will help in the future.   I am not sure that there is atrial capture any output.   We will stop his aspirin and put him on apixaban. He has no symptoms of ischemia.  We spent about 50 min exploring device function and quest for evidence of atral activity And addressing his request regarding FAA forms

## 2013-11-18 ENCOUNTER — Encounter: Payer: Self-pay | Admitting: Internal Medicine

## 2013-11-26 ENCOUNTER — Encounter: Payer: Self-pay | Admitting: Endocrinology

## 2013-11-27 ENCOUNTER — Other Ambulatory Visit: Payer: Self-pay

## 2013-11-27 MED ORDER — METFORMIN HCL 1000 MG PO TABS: ORAL_TABLET | ORAL | Status: DC

## 2013-11-27 NOTE — Telephone Encounter (Signed)
Please fax refill, thank you.

## 2013-12-04 ENCOUNTER — Ambulatory Visit (INDEPENDENT_AMBULATORY_CARE_PROVIDER_SITE_OTHER): Payer: Federal, State, Local not specified - PPO | Admitting: *Deleted

## 2013-12-04 ENCOUNTER — Encounter: Payer: Self-pay | Admitting: Internal Medicine

## 2013-12-04 VITALS — BP 134/80 | HR 97

## 2013-12-04 DIAGNOSIS — I495 Sick sinus syndrome: Secondary | ICD-10-CM

## 2013-12-04 DIAGNOSIS — I480 Paroxysmal atrial fibrillation: Secondary | ICD-10-CM

## 2013-12-04 LAB — MDC_IDC_ENUM_SESS_TYPE_INCLINIC
Battery Impedance: 211 Ohm
Battery Remaining Longevity: 108 mo
Brady Statistic AP VP Percent: 46 %
Brady Statistic AP VS Percent: 38 %
Brady Statistic AS VP Percent: 9 %
Brady Statistic AS VS Percent: 6 %
Date Time Interrogation Session: 20151120093840
Lead Channel Impedance Value: 428 Ohm
Lead Channel Impedance Value: 706 Ohm
Lead Channel Setting Pacing Amplitude: 2 V
Lead Channel Setting Pacing Amplitude: 2.5 V
Lead Channel Setting Pacing Pulse Width: 0.4 ms
Lead Channel Setting Sensing Sensitivity: 2.8 mV
MDC IDC MSMT BATTERY VOLTAGE: 2.78 V
MDC IDC MSMT LEADCHNL RA SENSING INTR AMPL: 0.25 mV

## 2013-12-04 NOTE — Progress Notes (Signed)
Patient walks into office today complaining of abnormal heart rhythm. Heart rate as fast as 107. Patient denies SOB or any other symptoms besides irregular beats.  Pacemaker interrogated - showing 4 min episode of Afib this morning, occasional PACs, possible underlying AFib. Will review reports with Dr. Caryl Comes Monday when he returns to office.  Patient advised to monitor over the weekend and call if symptoms arise. Patient currently taking Eliquis (CHADS score 5), advised to continue medication.

## 2013-12-04 NOTE — Progress Notes (Signed)
Pacemaker check in clinic for underlying rhythm. Atrial rhythm is questionable for undersensed AF. A. sense test performed---"P" waves measured at 0.18-0.25mV (sensitivity 0.13m)---previous test demonstrated no underlying atrial activity. Underlying 12 lead performed---no convincing P waves noted. Pt tracking up to 100s due to inability to properly mode switch---pt w/symptoms in abdomen. Mode was changed from MVP (R) to DDIR until further consult w/SK.

## 2013-12-04 NOTE — Addendum Note (Signed)
Addended by: Stanton Kidney on: 12/04/2013 05:11 PM   Modules accepted: Orders

## 2013-12-04 NOTE — Progress Notes (Signed)
1.  Reason for visit:  Heart feels out of rhythm  2.  Name of MD requesting visit:  Patient was walk-in  3. H&P:  Patient has hx Afib many years ago  4.  ROS related to problem:  Patient can feel irregular heart beats  5.  Assessment and plan per MD:  Will review with Dr. Caryl Comes when back in office. Device was interrogated with changes.

## 2013-12-04 NOTE — Patient Instructions (Signed)
Your physician recommends that you continue on your current medications as directed. Please refer to the Current Medication list given to you today.  Call office over the weekend if your heart goes out of rhythm.

## 2013-12-04 NOTE — Progress Notes (Signed)
Reviewed by Dr. Caryl Comes. No changes or orders since patient is feeling better per his email today.

## 2013-12-16 ENCOUNTER — Encounter: Payer: Self-pay | Admitting: Internal Medicine

## 2013-12-24 ENCOUNTER — Encounter (HOSPITAL_COMMUNITY): Payer: Self-pay | Admitting: Internal Medicine

## 2014-01-10 ENCOUNTER — Ambulatory Visit (INDEPENDENT_AMBULATORY_CARE_PROVIDER_SITE_OTHER): Payer: Federal, State, Local not specified - PPO | Admitting: Emergency Medicine

## 2014-01-10 ENCOUNTER — Ambulatory Visit (INDEPENDENT_AMBULATORY_CARE_PROVIDER_SITE_OTHER): Payer: Federal, State, Local not specified - PPO

## 2014-01-10 VITALS — BP 142/84 | HR 86 | Temp 98.0°F | Resp 16 | Ht 70.0 in | Wt 245.8 lb

## 2014-01-10 DIAGNOSIS — M7051 Other bursitis of knee, right knee: Secondary | ICD-10-CM

## 2014-01-10 DIAGNOSIS — M25561 Pain in right knee: Secondary | ICD-10-CM

## 2014-01-10 LAB — GLUCOSE, POCT (MANUAL RESULT ENTRY): POC Glucose: 236 mg/dl — AB (ref 70–99)

## 2014-01-10 LAB — POCT CBC
Granulocyte percent: 77.3 %G (ref 37–80)
HCT, POC: 42.7 % — AB (ref 43.5–53.7)
Hemoglobin: 13.4 g/dL — AB (ref 14.1–18.1)
Lymph, poc: 2 (ref 0.6–3.4)
MCH: 23.6 pg — AB (ref 27–31.2)
MCHC: 31.3 g/dL — AB (ref 31.8–35.4)
MCV: 75.6 fL — AB (ref 80–97)
MID (CBC): 0.7 (ref 0–0.9)
MPV: 8.7 fL (ref 0–99.8)
PLATELET COUNT, POC: 182 10*3/uL (ref 142–424)
POC Granulocyte: 9.1 — AB (ref 2–6.9)
POC LYMPH %: 16.6 % (ref 10–50)
POC MID %: 6.1 % (ref 0–12)
RBC: 5.65 M/uL (ref 4.69–6.13)
RDW, POC: 18.9 %
WBC: 11.8 10*3/uL — AB (ref 4.6–10.2)

## 2014-01-10 MED ORDER — DOXYCYCLINE HYCLATE 100 MG PO TABS: 100.0000 mg | ORAL_TABLET | Freq: Two times a day (BID) | ORAL | Status: DC

## 2014-01-10 MED ORDER — OXYCODONE HCL 5 MG PO TABS: 5.0000 mg | ORAL_TABLET | ORAL | Status: DC | PRN

## 2014-01-10 NOTE — Progress Notes (Signed)
Subjective:  This chart was scribed for Darlyne Russian, MD, by Starleen Arms, Medical Scribe. This patient was seen in room Rm 1 and the patient's care was started at 11:36 AM.   Patient ID: Edward [name], male    DOB: May 15, 1940, 73 y.o.   MRN: [id number]  HPI HPI Comments: Edward [name] is a 73 y.o. male with a history of DM who presents to [facility] complaining of right knee pain and swelling onset without injury.  Patient denies the knee locks-up and states it gives out rarely.  Patient began applying ice yesterday without relief.  Patient has a walker at home.  Patient has had a left knee replacement by Dr. Telford Nab.  Review of Systems  Musculoskeletal: Positive for joint swelling and arthralgias.       Objective:   Physical Exam  Constitutional: He is oriented to person, place, and time. He appears well-developed and well-nourished. No distress.  HENT:  Head: Normocephalic and atraumatic.  Eyes: Conjunctivae and EOM are normal.  Neck: Neck supple. No tracheal deviation present.  Cardiovascular: Normal rate.   Pulmonary/Chest: Effort normal. No respiratory distress.  Musculoskeletal: Normal range of motion.  Tenderness and swelling over the prepatellar bursa.  Pain with flexion and extension of the right knee.  Has difficulty walking and is unable to bear weight on the right knee without significant discomfort.   Neurological: He is alert and oriented to person, place, and time.  Skin: Skin is warm and dry.  Psychiatric: He has a normal mood and affect. His behavior is normal.  Nursing note and vitals reviewed.  Results for orders placed or performed in visit on 01/10/14  POCT CBC  Result Value Ref Range   WBC 11.8 (A) 4.6 - 10.2 K/uL   Lymph, poc 2.0 0.6 - 3.4   POC LYMPH PERCENT 16.6 10 - 50 %L   MID (cbc) 0.7 0 - 0.9   POC MID % 6.1 0 - 12 %M   POC Granulocyte 9.1 (A) 2 - 6.9   Granulocyte percent 77.3 37 - 80 %G   RBC 5.65 4.69 - 6.13 M/uL   Hemoglobin 13.4 (A) 14.1 -  18.1 g/dL   HCT, POC 42.7 (A) 43.5 - 53.7 %   MCV 75.6 (A) 80 - 97 fL   MCH, POC 23.6 (A) 27 - 31.2 pg   MCHC 31.3 (A) 31.8 - 35.4 g/dL   RDW, POC 18.9 %   Platelet Count, POC 182 142 - 424 K/uL   MPV 8.7 0 - 99.8 fL  POCT glucose (manual entry)  Result Value Ref Range   POC Glucose 236 (A) 70 - 99 mg/dl    11:39 AM Will prescribe pain medication and antibiotic.  Will order imaging and labs.  Advised patient to continue icing the area.  Will provide referral to orthopaedist.  Patient acknowledges and agrees with plan.    12:22 PM Primary X-Ray Reading by Dr. Everlene Farrier: Mild narrowing of medial joint space with soft-tissue calcification in the arterial system otherwise negative.     Assessment & Plan:  Referral made to orthopedics. His white count is elevated worrisome for infection in the prepatellar bursa. He will be started on doxycycline. He was given oxycodone half for severe pain. I did not start a nonsteroidal or prednisone because of his underlying health condition.I personally performed the services described in this documentation, which was scribed in my presence. The recorded information has been reviewed and is accurate.

## 2014-01-12 ENCOUNTER — Encounter: Payer: Self-pay | Admitting: Emergency Medicine

## 2014-01-12 ENCOUNTER — Other Ambulatory Visit: Payer: Self-pay | Admitting: Emergency Medicine

## 2014-01-12 DIAGNOSIS — M7051 Other bursitis of knee, right knee: Secondary | ICD-10-CM

## 2014-01-15 ENCOUNTER — Encounter: Payer: Self-pay | Admitting: Family

## 2014-01-28 ENCOUNTER — Encounter: Payer: Self-pay | Admitting: Internal Medicine

## 2014-02-01 ENCOUNTER — Other Ambulatory Visit: Payer: Self-pay

## 2014-02-01 MED ORDER — APIXABAN 5 MG PO TABS: 5.0000 mg | ORAL_TABLET | Freq: Two times a day (BID) | ORAL | Status: DC

## 2014-02-02 ENCOUNTER — Encounter: Payer: Self-pay | Admitting: Internal Medicine

## 2014-02-03 NOTE — Telephone Encounter (Signed)
Attempting to print Eliquis [facility] for [facility] per patient request.  Unable to print Rx from this encounter

## 2014-02-10 IMAGING — US US ABDOMEN COMPLETE
1 series · 14 of 25 positions shown · non-contrast
Comparison: CT abdomen and pelvis 04/28/2010

CLINICAL DATA: Increased abdominal girth and anemia.

EXAM:
ULTRASOUND ABDOMEN COMPLETE

[Series 1: us abdomen complete · 0.30mm/px · 14 of 99 slices shown]
[im 1/99]
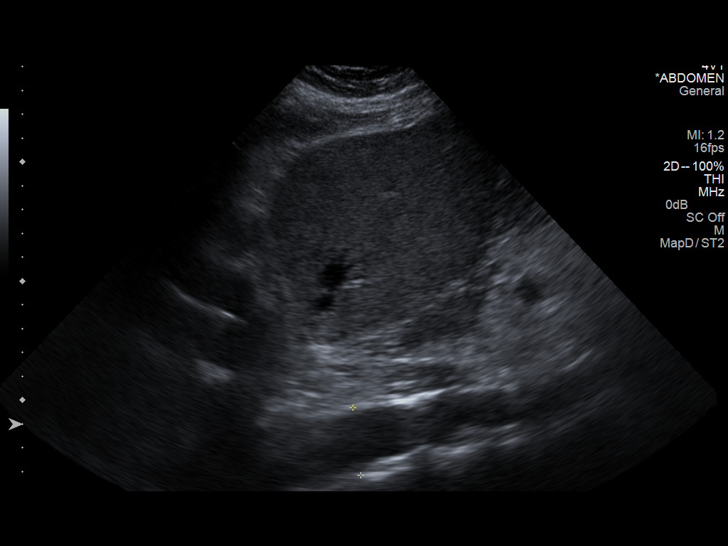
[im 9/99]
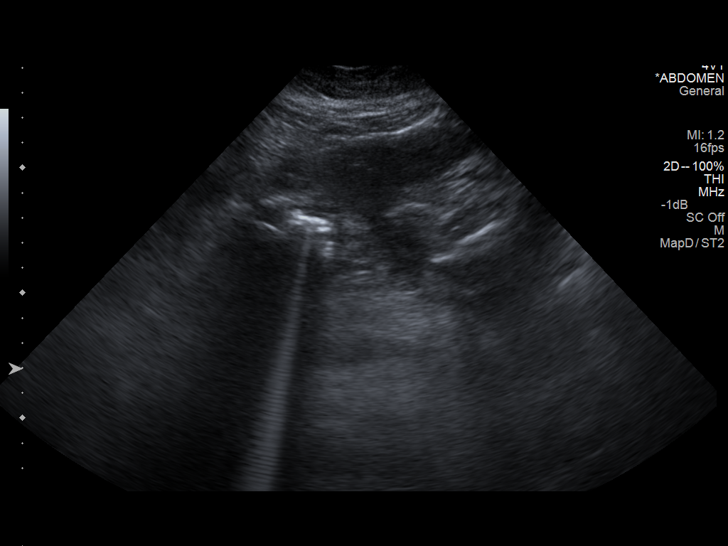
[im 17/99]
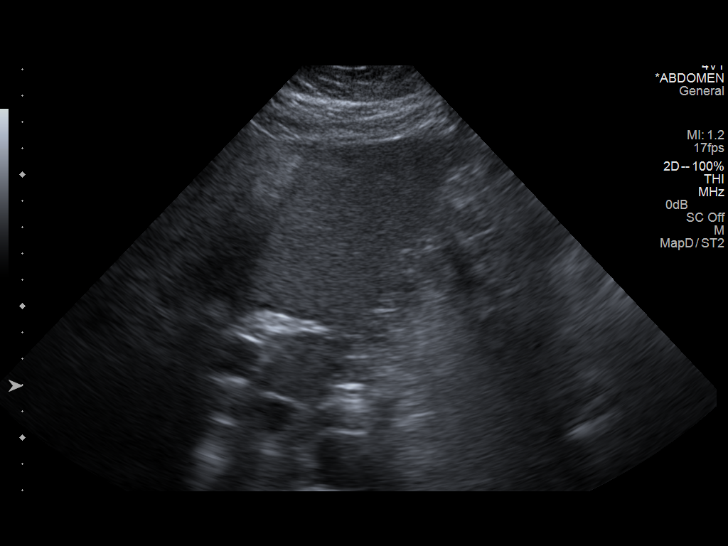
[im 25/99]
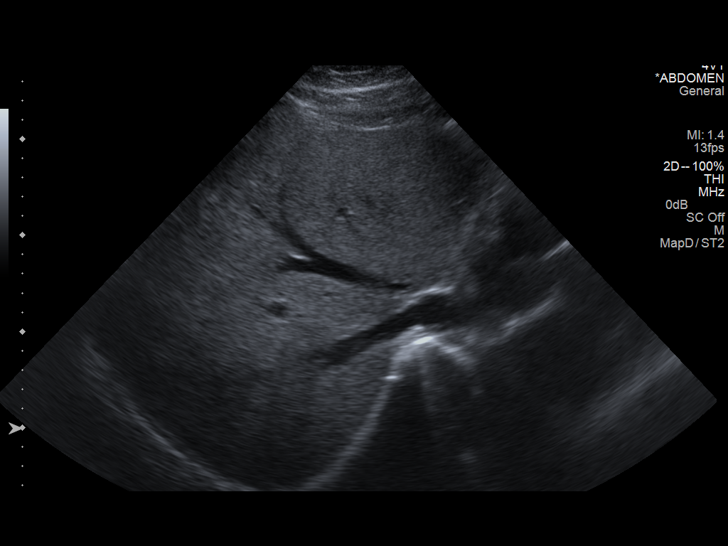
[im 33/99]
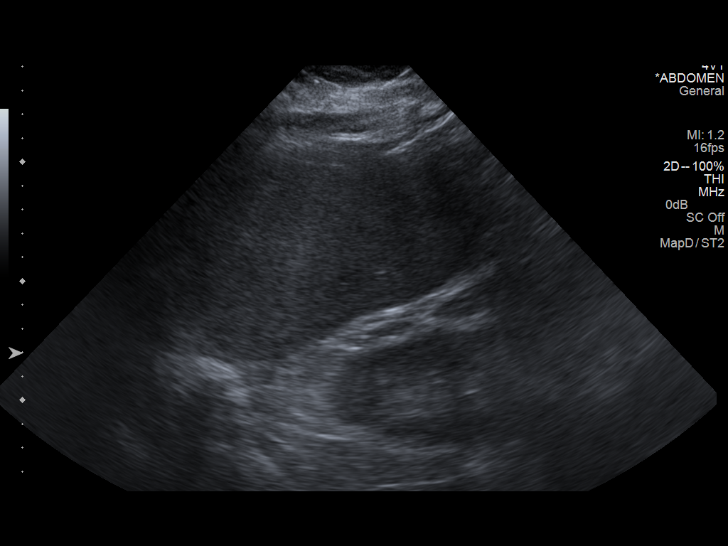
[im 37/99]
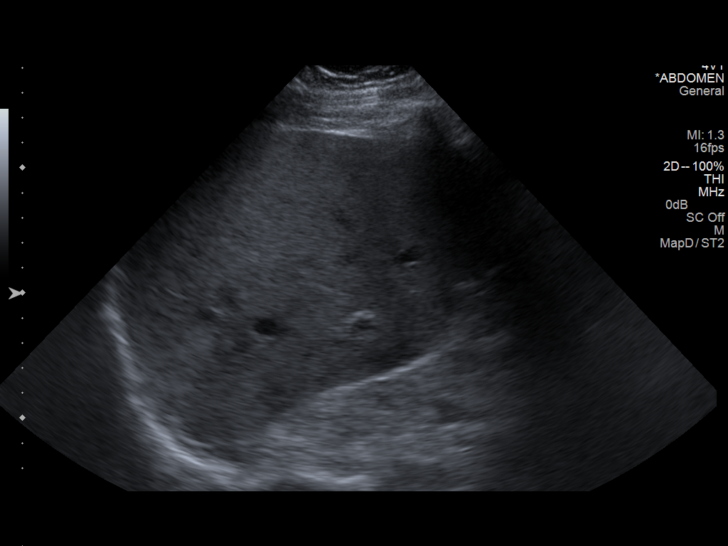
[im 45/99]
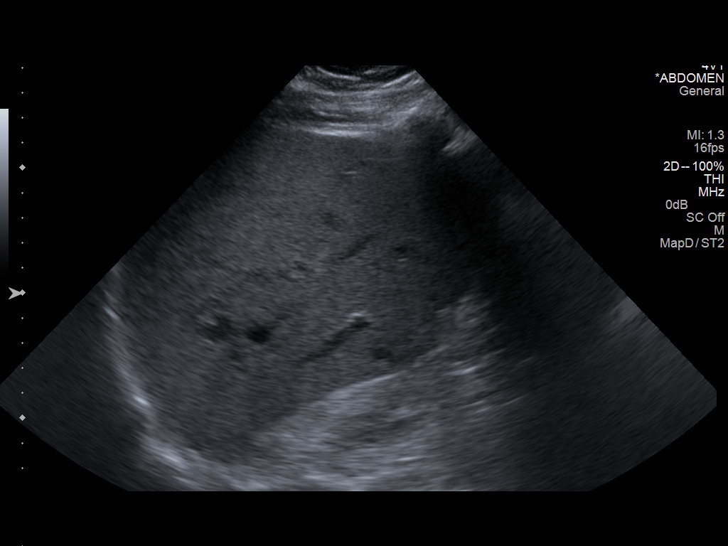
[im 54/99]
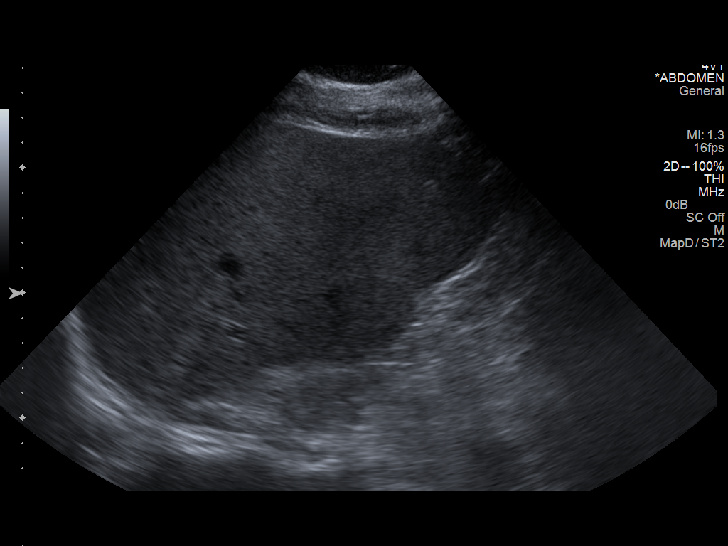
[im 62/99]
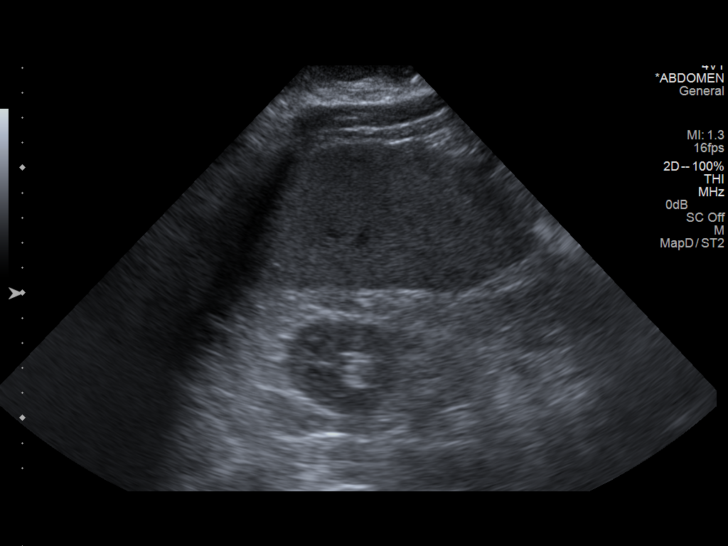
[im 66/99]
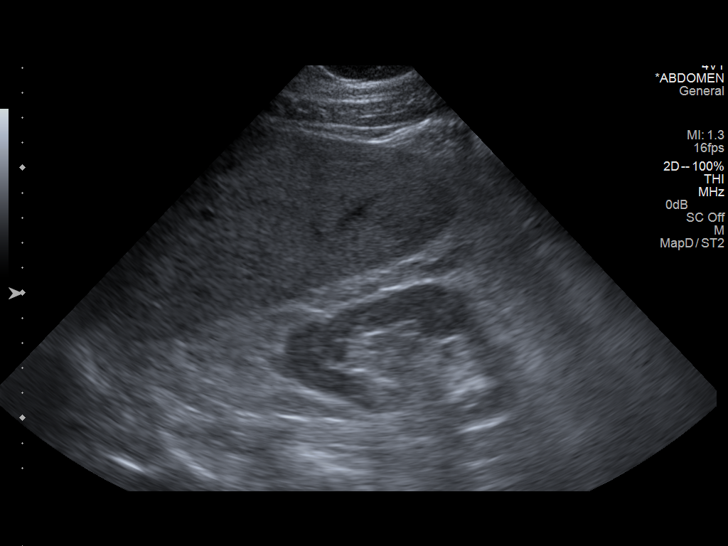
[im 74/99]
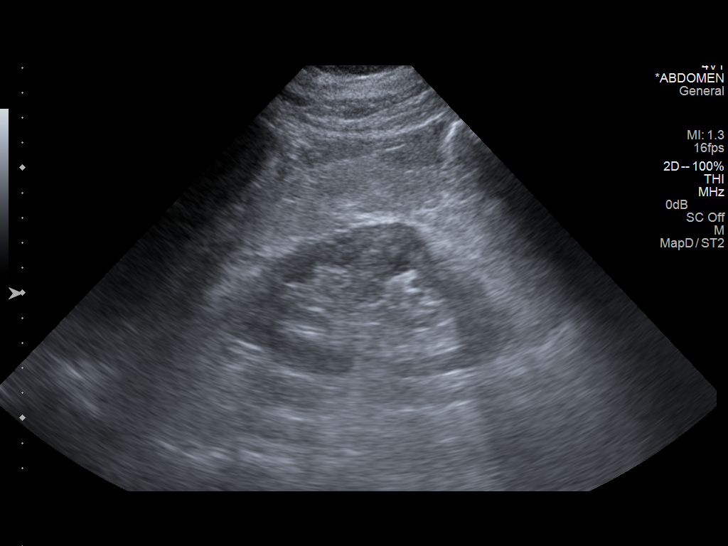
[im 82/99]
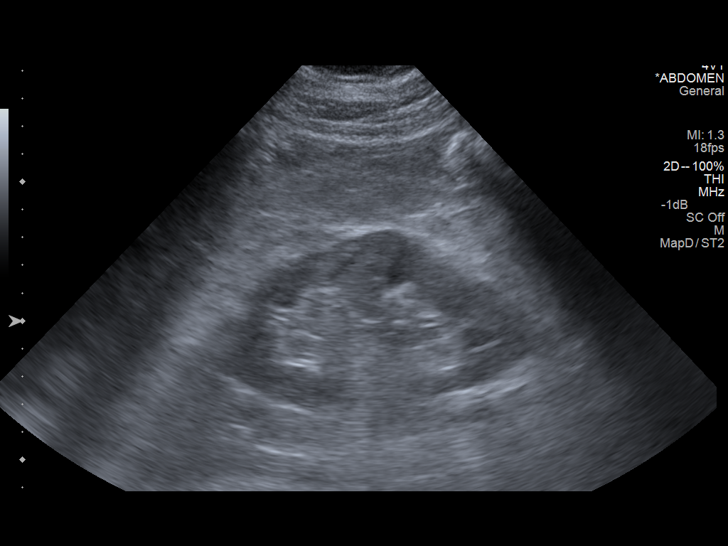
[im 90/99]
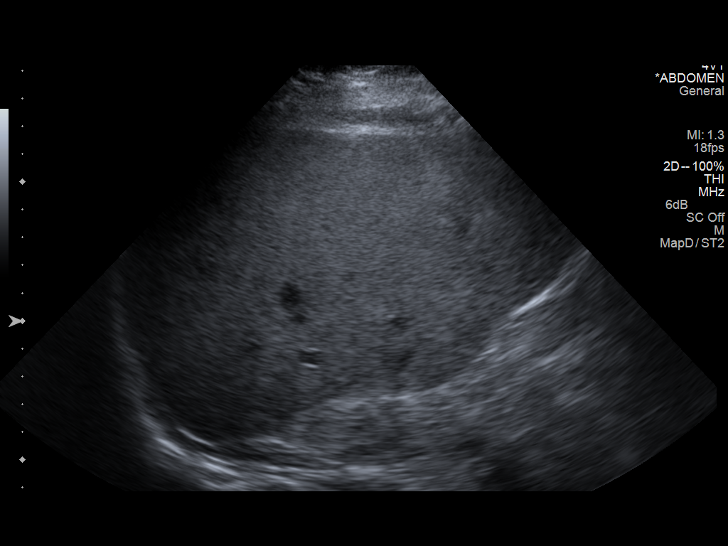
[im 99/99]
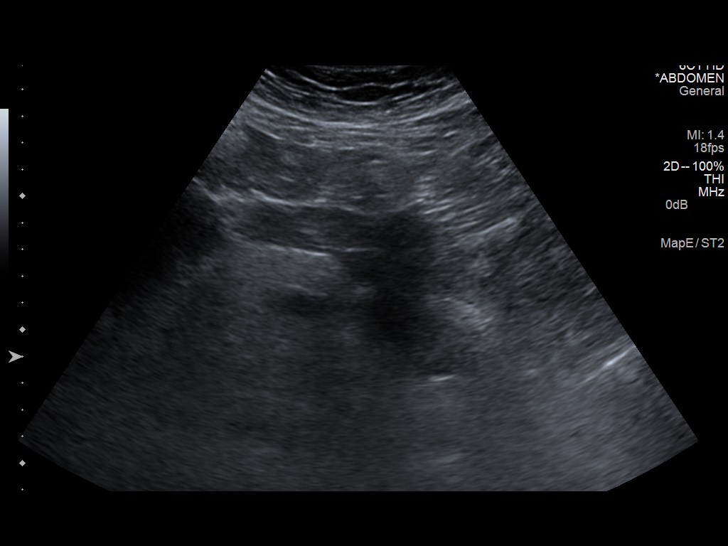

[14 of 25 positions shown; findings below may reference images not displayed]

FINDINGS: Gallbladder:

Surgically absent

Common bile duct:

Diameter: 4 mm

Liver:

No focal lesion identified. Within normal limits in parenchymal
echogenicity.

IVC:

No abnormality visualized.

Pancreas:

Visualized portion unremarkable. The distal body and tail are not
well visualized.

Spleen:

Size and appearance within normal limits.

Right Kidney:

Length: 11.7 cm. Echogenicity within normal limits. No mass or
hydronephrosis visualized.

Left Kidney:

Length: 11.2 cm. Echogenicity within normal limits. No mass or
hydronephrosis visualized.

Abdominal aorta:

No aneurysm visualized.

Other findings:

None.
IMPRESSION: Negative examination.  Patient is status post cholecystectomy.

## 2014-02-11 ENCOUNTER — Other Ambulatory Visit: Payer: Self-pay | Admitting: Internal Medicine

## 2014-02-15 ENCOUNTER — Other Ambulatory Visit: Payer: Self-pay | Admitting: Cardiovascular Disease

## 2014-02-18 ENCOUNTER — Other Ambulatory Visit: Payer: Self-pay | Admitting: Cardiovascular Disease

## 2014-02-21 ENCOUNTER — Other Ambulatory Visit: Payer: Self-pay | Admitting: Cardiovascular Disease

## 2014-02-26 ENCOUNTER — Other Ambulatory Visit (HOSPITAL_COMMUNITY): Payer: Federal, State, Local not specified - PPO

## 2014-02-26 ENCOUNTER — Encounter (HOSPITAL_COMMUNITY): Payer: Federal, State, Local not specified - PPO

## 2014-02-26 ENCOUNTER — Ambulatory Visit: Payer: Federal, State, Local not specified - PPO | Admitting: Family

## 2014-03-01 ENCOUNTER — Other Ambulatory Visit: Payer: Self-pay | Admitting: Nurse Practitioner

## 2014-03-01 MED ORDER — PRAVASTATIN SODIUM 80 MG PO TABS: ORAL_TABLET | ORAL | Status: DC

## 2014-03-02 ENCOUNTER — Encounter: Payer: Self-pay | Admitting: Family

## 2014-03-03 ENCOUNTER — Ambulatory Visit (INDEPENDENT_AMBULATORY_CARE_PROVIDER_SITE_OTHER): Payer: Federal, State, Local not specified - PPO | Admitting: Family

## 2014-03-03 ENCOUNTER — Ambulatory Visit (INDEPENDENT_AMBULATORY_CARE_PROVIDER_SITE_OTHER)
Admission: RE | Admit: 2014-03-03 | Discharge: 2014-03-03 | Disposition: A | Discharge status: Home / Self Care | Payer: Federal, State, Local not specified - PPO | Source: Ambulatory Visit | Attending: Family | Admitting: Family

## 2014-03-03 ENCOUNTER — Ambulatory Visit (HOSPITAL_COMMUNITY)
Admission: RE | Admit: 2014-03-03 | Discharge: 2014-03-03 | Disposition: A | Discharge status: Home / Self Care | Payer: Federal, State, Local not specified - PPO | Source: Ambulatory Visit | Attending: Family | Admitting: Family

## 2014-03-03 ENCOUNTER — Encounter: Payer: Self-pay | Admitting: Family

## 2014-03-03 VITALS — BP 146/90 | HR 84 | Resp 16 | Ht 69.5 in | Wt 243.0 lb

## 2014-03-03 DIAGNOSIS — Z87891 Personal history of nicotine dependence: Secondary | ICD-10-CM

## 2014-03-03 DIAGNOSIS — Z9889 Other specified postprocedural states: Secondary | ICD-10-CM

## 2014-03-03 DIAGNOSIS — Z48812 Encounter for surgical aftercare following surgery on the circulatory system: Secondary | ICD-10-CM

## 2014-03-03 DIAGNOSIS — E1159 Type 2 diabetes mellitus with other circulatory complications: Secondary | ICD-10-CM | POA: Diagnosis present

## 2014-03-03 DIAGNOSIS — I739 Peripheral vascular disease, unspecified: Secondary | ICD-10-CM

## 2014-03-03 DIAGNOSIS — E1151 Type 2 diabetes mellitus with diabetic peripheral angiopathy without gangrene: Secondary | ICD-10-CM

## 2014-03-03 DIAGNOSIS — Z95828 Presence of other vascular implants and grafts: Secondary | ICD-10-CM

## 2014-03-03 NOTE — Progress Notes (Signed)
[facility] [place] HISTORY AND PHYSICAL   MRN : [id number]  History of Present Illness:   Edward [name] is a 74 y.o. male patient of Dr. Scot Dock who is s/p left femoropopliteal bypass graft with vein on 03/06/2012.  He is also s/p right CEA on 03/22/09.  He returns today for 6 months follow up surveillance of PAD, will return in a year for carotid Duplex and repeat PAD surveillance He denies claudication symptoms, denies non-healing wounds.  He had slurred speech for about a day, in 2012, no further TIA or stroke symptoms.  He states that his systolic blood pressure is usually 140-150's.  Pt reports New Medical or Surgical History: none  He states his recent stress test was normal. 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   Pt Diabetic: Yes, Review of records: his A1C in September 2015 was 6.9%, in control  Pt smoker: former smoker, quit in 1981   Pt meds include:  Statin :Yes  Betablocker: Yes  ASA: No, stopped when Eliquis started in October 2015  Other anticoagulants/antiplatelets: Eliquis for atrial fibrillation, (review of records) started after his October 2015 visit with his cardiologist, Dr. Caryl Comes. 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    Current Outpatient Prescriptions  Medication Sig Dispense Refill  . acetaminophen (TYLENOL) 500 MG tablet Take 500 mg by mouth every 6 (six) hours as needed for mild pain or moderate pain.     Marland Kitchen apixaban (ELIQUIS) 5 MG TABS tablet Take 1 tablet (5 mg total) by mouth 2 (two) times daily. 60 tablet 6  . carvedilol (COREG) 25 MG tablet TAKE 1 TABLET TWICE A DAY 180 tablet 1  . diphenhydramine-acetaminophen (TYLENOL PM) 25-500 MG TABS Take 1 tablet by mouth at bedtime as needed (sleep).     Marland Kitchen doxycycline (VIBRA-TABS) 100 MG tablet Take 1 tablet (100 mg total) by mouth 2 (two) times daily. 20 tablet 0  . furosemide (LASIX) 40 MG tablet Take 2 tablets (80 mg total) by mouth  daily. 30 tablet 3  . glimepiride (AMARYL) 4 MG tablet Take 1 tablet (4 mg total) by mouth daily before breakfast. 90 tablet 3  . Iron Combinations (IRON COMPLEX PO) Take 2 capsules by mouth daily.    Marland Kitchen KLOR-CON M20 20 MEQ tablet TAKE 1 TABLET BY MOUTH EVERY DAY 30 tablet 1  . L-Methylfolate-Algae-B12-B6 (METANX) 3-90.314-2-35 MG CAPS TAKE 1 CAPSULE BY MOUTH ONCE DAILY 60 capsule 3  . Liraglutide (VICTOZA) 18 MG/3ML SOPN Inject 1.8 mg into the skin daily. 9 mL 11  . loratadine (CLARITIN) 10 MG tablet Take 10 mg by mouth daily.    Marland Kitchen losartan (COZAAR) 100 MG tablet TAKE 1 TABLET BY MOUTH ONCE DAILY 90 tablet 2  . magnesium gluconate (MAGONATE) 500 MG tablet Take 500 mg by mouth daily.     . metFORMIN (GLUCOPHAGE) 1000 MG tablet Take 1 tablet by mouth every am and 1 1/2 tablets every pm 225 tablet 0  . Multiple Vitamin (MULTIVITAMIN WITH MINERALS) TABS Take 1 tablet by mouth daily.    Marland Kitchen NOVOTWIST 32G X 5 MM MISC     . omeprazole (PRILOSEC) 20 MG capsule daily.    Glory Rosebush DELICA LANCETS FINE MISC 1 each by other route daily. 1/day 250.62 100 each 3  . ONETOUCH VERIO test strip     . oxyCODONE (ROXICODONE) 5 MG immediate release tablet Take 1 tablet (5 mg total) by mouth every  4 (four) hours as needed for severe pain. 20 tablet 0  . oxymetazoline (AFRIN) 0.05 % nasal spray Place 2 sprays into the nose daily as needed for congestion.    . pravastatin (PRAVACHOL) 80 MG tablet TAKE 1 TABLET BY MOUTH EVERY DAY 30 tablet 1   No current facility-administered medications for this visit.    Past Medical History  Diagnosis Date  . A-fib   . HTN (hypertension)   . Dyslipidemia   . PVD (peripheral vascular disease)     s/p Right CEA March, 2011,   . Claudication   . Hyperlipidemia   . GERD (gastroesophageal reflux disease)   . Diabetic neuropathy     mostly feet/ legs  . Pacemaker   . Stroke     ?  Marland Kitchen Anemia     ?  . Diastolic CHF 4/0/9811  . Exertional shortness of breath   . Type II  diabetes mellitus   . Neuromuscular disorder     neuropathy  . Coronary artery disease     Social History History  Substance Use Topics  . Smoking status: Former Smoker -- 3.00 packs/day for 32 years    Types: Cigarettes    Quit date: 01/16/1979  . Smokeless tobacco: Never Used  . Alcohol Use: 1.2 oz/week    2 Glasses of wine per week     Comment: beer 1-2 weekly (occasional)    Family History Family History  Problem Relation Age of Onset  . Coronary artery disease Father   . Hypertension Father   . Heart disease Father     Heart Disease before age 29  . Heart attack Mother   . Coronary artery disease Mother   . Deep vein thrombosis Mother   . Heart disease Mother     Heart Disease before age 25  . Hyperlipidemia Mother   . Hypertension Mother   . Hypertension Sister   . Heart disease Sister     Heart Disease before age 66  . Hyperlipidemia Sister   . Anesthesia problems Neg Hx   . Heart disease Brother     Heart Disease before age 36  . Diabetes Neg Hx   . Colon cancer Neg Hx     Surgical History Past Surgical History  Procedure Laterality Date  . Carotid endarterectomy Right 03/2009  . Cholecystectomy    . Tonsillectomy and adenoidectomy    . Knee arthroscopy Left     "I had 3" (11/05/2012)  . Maze  06/14/2011    Procedure: MAZE;  Surgeon: Gaye Pollack, MD;  Location: [facility];  Service: Open Heart Surgery;  Laterality: N/A;  Ligate left atrial appendage  . Circumcision    . Femoral-popliteal bypass graft Left 03/06/2012    Procedure: BYPASS GRAFT FEMORAL-POPLITEAL ARTERY;  Surgeon: Angelia Mould, MD;  Location: [facility] OR;  Service: Vascular;  Laterality: Left;  Left Femoral - Below Knee Popliteal Bypass Graft with Vein and Intraoperative Arteriogram.  . Total knee arthroplasty Left   . Lead revision  11/05/2012    pacemaker/notes 11/05/2012  . Coronary artery bypass graft  06/14/2011    Procedure: CORONARY ARTERY BYPASS GRAFTING (CABG);  Surgeon: Gaye Pollack, MD;  Location: [facility];  Service: Open Heart Surgery;  Laterality: N/A;  Coronary Artery Bypass Graft on pump times six;  utilizing internal mammary artery and right greater saphenous vein harvested endoscopically.x 5 vessels  . Insert / replace / remove pacemaker  2013    /notes 11/05/2012-[employer]  .  Joint replacement Left     left TKA  . Esophagogastroduodenoscopy (egd) with propofol N/A 02/17/2013    Procedure: ESOPHAGOGASTRODUODENOSCOPY (EGD) WITH PROPOFOL;  Surgeon: Jerene Bears, MD;  Location: [facility];  Service: Gastroenterology;  Laterality: N/A;  . Balloon dilation N/A 02/17/2013    Procedure: BALLOON DILATION;  Surgeon: Jerene Bears, MD;  Location: [facility];  Service: Gastroenterology;  Laterality: N/A;  . Colonoscopy with propofol N/A 02/17/2013    Procedure: COLONOSCOPY WITH PROPOFOL;  Surgeon: Jerene Bears, MD;  Location: [facility];  Service: Gastroenterology;  Laterality: N/A;  . Permanent pacemaker insertion N/A 06/20/2011    Procedure: PERMANENT PACEMAKER INSERTION;  Surgeon: Deboraha Sprang, MD;  Location: [facility] CATH LAB;  Service: Cardiovascular;  Laterality: N/A;  . Abdominal aortagram N/A 01/07/2012    Procedure: ABDOMINAL Maxcine Ham;  Surgeon: Angelia Mould, MD;  Location: [facility] CATH LAB;  Service: Cardiovascular;  Laterality: N/A;  . Lead revision N/A 11/05/2012    Procedure: LEAD REVISION;  Surgeon: Deboraha Sprang, MD;  Location: [facility] CATH LAB;  Service: Cardiovascular;  Laterality: N/A;  . Electrophysiology study N/A 11/05/2012    Procedure: ELECTROPHYSIOLOGY STUDY;  Surgeon: Deboraha Sprang, MD;  Location: [facility] CATH LAB;  Service: Cardiovascular;  Laterality: N/A;    Allergies  Allergen Reactions  . Lipitor [Atorvastatin Calcium] Other (See Comments)    Reaction unknown    Current Outpatient Prescriptions  Medication Sig Dispense Refill  . acetaminophen (TYLENOL) 500 MG tablet Take 500 mg by mouth every 6 (six) hours as needed for mild pain or moderate  pain.     Marland Kitchen apixaban (ELIQUIS) 5 MG TABS tablet Take 1 tablet (5 mg total) by mouth 2 (two) times daily. 60 tablet 6  . carvedilol (COREG) 25 MG tablet TAKE 1 TABLET TWICE A DAY 180 tablet 1  . diphenhydramine-acetaminophen (TYLENOL PM) 25-500 MG TABS Take 1 tablet by mouth at bedtime as needed (sleep).     Marland Kitchen doxycycline (VIBRA-TABS) 100 MG tablet Take 1 tablet (100 mg total) by mouth 2 (two) times daily. 20 tablet 0  . furosemide (LASIX) 40 MG tablet Take 2 tablets (80 mg total) by mouth daily. 30 tablet 3  . glimepiride (AMARYL) 4 MG tablet Take 1 tablet (4 mg total) by mouth daily before breakfast. 90 tablet 3  . Iron Combinations (IRON COMPLEX PO) Take 2 capsules by mouth daily.    Marland Kitchen KLOR-CON M20 20 MEQ tablet TAKE 1 TABLET BY MOUTH EVERY DAY 30 tablet 1  . L-Methylfolate-Algae-B12-B6 (METANX) 3-90.314-2-35 MG CAPS TAKE 1 CAPSULE BY MOUTH ONCE DAILY 60 capsule 3  . Liraglutide (VICTOZA) 18 MG/3ML SOPN Inject 1.8 mg into the skin daily. 9 mL 11  . loratadine (CLARITIN) 10 MG tablet Take 10 mg by mouth daily.    Marland Kitchen losartan (COZAAR) 100 MG tablet TAKE 1 TABLET BY MOUTH ONCE DAILY 90 tablet 2  . magnesium gluconate (MAGONATE) 500 MG tablet Take 500 mg by mouth daily.     . metFORMIN (GLUCOPHAGE) 1000 MG tablet Take 1 tablet by mouth every am and 1 1/2 tablets every pm 225 tablet 0  . Multiple Vitamin (MULTIVITAMIN WITH MINERALS) TABS Take 1 tablet by mouth daily.    Marland Kitchen NOVOTWIST 32G X 5 MM MISC     . omeprazole (PRILOSEC) 20 MG capsule daily.    Glory Rosebush DELICA LANCETS FINE MISC 1 each by other route daily. 1/day 250.62 100 each 3  . ONETOUCH VERIO test strip     .  oxyCODONE (ROXICODONE) 5 MG immediate release tablet Take 1 tablet (5 mg total) by mouth every 4 (four) hours as needed for severe pain. 20 tablet 0  . oxymetazoline (AFRIN) 0.05 % nasal spray Place 2 sprays into the nose daily as needed for congestion.    . pravastatin (PRAVACHOL) 80 MG tablet TAKE 1 TABLET BY MOUTH EVERY DAY 30  tablet 1   No current facility-administered medications for this visit.     REVIEW OF SYSTEMS: See HPI for pertinent positives and negatives.  Physical Examination Filed Vitals:   03/03/14 1453  BP: 146/90  Pulse: 84  Resp: 16  Height: 5' 9.5" (1.765 m)  Weight: 243 lb (110.224 kg)  SpO2: 96%   Body mass index is 35.38 kg/(m^2).   General: A&O x 3, WDWN, obese male.  Gait: normal  Eyes: PERRLA,  Pulmonary: CTAB, without wheezes , rales or rhonchi  Cardiac: regular Rythm , without detected murmur   Carotid Bruits  Left  Right    Negative  Negative   Aorta: is not palpable  Radial pulses: are 2+ and =   VASCULAR EXAM:  Extremities without ischemic changes  without Gangrene; without open wounds.  Both feet are warm and well-perfused. He has moderate bilateral lower extremity swelling.   LE Pulses  LEFT  RIGHT   FEMORAL  2+ palpable  2+ palpable   POPLITEAL  not palpable  not palpable   PT Not palpable Not palpable  DP 2+ palpable 2+palpable   Abdomen: soft, NT, no palpable masses.  Skin: no rashes, no ulcers.  Musculoskeletal: no muscle wasting or atrophy.  Neurologic: A&O X 3; Appropriate Affect, MOTOR FUNCTION: moving all extremities equally, motor strength 5/5 throughout. Speech is fluent/normal. CN 2-12 intact.          Non-Invasive Vascular Imaging (03/03/2014):  Duplex of left LE: Widely patent graft with no evidence of stenosis.  ABI's: Right: 1.13 with triphasic waveforms, TBI: 1.14; Left: 1.15 with triphasic waveforms, TBI: 0.95   ASSESSMENT:  Edward [name] is a 74 y.o. male who is s/p left femoropopliteal bypass graft with vein on 03/06/2012.  He is also s/p right CEA on 03/22/09.  He denies claudication symptoms, denies non-healing wounds.  He had slurred speech for about a day, in 2012, no further TIA or stroke symptoms.  Today's Duplex of left lower extremity reveals a widely patent graft with no evidence  of stenosis.  ABI's are 100% in both legs with all triphasic waveforms, TBI's are in normal ranges also. Pt's wife reports that pt has not taken his Victoza for over 4 months, pt admits to not checking his blood sugar every day. Pt was advised to take his medications as prescribed and to let his medical provider that helps him manage his DM to know of the above. I reiterated to pt the role that uncontrolled blood sugar has in facilitating atherosclerosis and keeping his blood sugar in as good control as possible reduces his risk of limb loss, heart attack, and stroke.  Face to face time with patient was 25 minutes. Over 50% of this time was spent on counseling and coordination of care.    PLAN:   Based on today's exam and non-invasive vascular lab results, the patient will follow up in 1 year with the following tests: carotid Duplex, ABI's, and left LE arterial Duplex; may cancel the carotid Duplex scheduled for 6 months from now. I discussed in depth with the patient the nature of atherosclerosis, and  emphasized the importance of maximal medical management including strict control of blood pressure, blood glucose, and lipid levels, obtaining regular exercise, and cessation of smoking.  The patient is aware that without maximal medical management the underlying atherosclerotic disease process will progress, limiting the benefit of any interventions.  The patient was given information about stroke prevention and what symptoms should prompt the patient to seek immediate medical care.  The patient was given information about PAD including signs, symptoms, treatment, what symptoms should prompt the patient to seek immediate medical care, and risk reduction measures to take. Thank you for allowing [place] to participate in this patient's care.  Clemon Chambers, RN, MSN, FNP-C [facility] Office: [phone]  Clinic MD: Scot Dock 03/03/2014 2:03 PM

## 2014-03-03 NOTE — Addendum Note (Signed)
Addended by: Dorthula Rue L on: 03/03/2014 04:32 PM   Modules accepted: Orders

## 2014-03-03 NOTE — Patient Instructions (Signed)

## 2014-04-06 ENCOUNTER — Ambulatory Visit (INDEPENDENT_AMBULATORY_CARE_PROVIDER_SITE_OTHER): Payer: Federal, State, Local not specified - PPO | Admitting: Family Medicine

## 2014-04-06 ENCOUNTER — Ambulatory Visit (INDEPENDENT_AMBULATORY_CARE_PROVIDER_SITE_OTHER): Payer: Federal, State, Local not specified - PPO

## 2014-04-06 VITALS — BP 120/78 | HR 83 | Temp 98.3°F | Resp 18 | Ht 70.0 in | Wt 242.0 lb

## 2014-04-06 DIAGNOSIS — R059 Cough, unspecified: Secondary | ICD-10-CM

## 2014-04-06 DIAGNOSIS — J209 Acute bronchitis, unspecified: Secondary | ICD-10-CM | POA: Diagnosis not present

## 2014-04-06 DIAGNOSIS — R05 Cough: Secondary | ICD-10-CM

## 2014-04-06 MED ORDER — DOXYCYCLINE HYCLATE 100 MG PO TABS
100.0000 mg | ORAL_TABLET | Freq: Two times a day (BID) | ORAL | Status: DC
Start: 2014-04-06 — End: 2014-05-27

## 2014-04-06 MED ORDER — GUAIFENESIN ER 600 MG PO TB12: 600.0000 mg | ORAL_TABLET | Freq: Two times a day (BID) | ORAL | Status: DC | PRN

## 2014-04-06 MED ORDER — HYDROCODONE-HOMATROPINE 5-1.5 MG/5ML PO SYRP: 5.0000 mL | ORAL_SOLUTION | Freq: Three times a day (TID) | ORAL | Status: DC | PRN

## 2014-04-06 NOTE — Progress Notes (Signed)
Chief Complaint:  Chief Complaint  Patient presents with  . Cough    x1 day dry     HPI: Edward [name] is a 74 y.o. male who is here for  day history of congestion in the chest, tried to minimally productive cough, he has tried Robitussin-DM, amoxicillin that he has had for dental procedures, and also over-the-counter cough medicine from [facility]. He has minimal wheezing. He has a history of atrial fib status post pacemaker, CHF, type 2 diabetes, full vascular disease, CAD, hyperlipidemia.Marland Kitchen He denies any weight gain or abnormally acute increase in pedal edema. His O2 saturation from our records show that he sits roughly between 95-96% but today it is 94%, he has had the pneumonia and flu vaccine. He denies any orthopnea, PND. Denies any fevers or chills. Denies any chest pain dizziness or palpitations.   SpO2 Readings from Last 3 Encounters:  04/06/14 94%  03/03/14 96%  01/10/14 96%   Wt Readings from Last 3 Encounters:  04/06/14 242 lb (109.77 kg)  03/03/14 243 lb (110.224 kg)  01/10/14 245 lb 12.8 oz (111.494 kg)    Past Medical History  Diagnosis Date  . A-fib   . HTN (hypertension)   . Dyslipidemia   . PVD (peripheral vascular disease)     s/p Right CEA March, 2011,   . Claudication   . Hyperlipidemia   . GERD (gastroesophageal reflux disease)   . Diabetic neuropathy     mostly feet/ legs  . Pacemaker   . Stroke     ?  Marland Kitchen Anemia     ?  . Diastolic CHF 07/23/9379  . Exertional shortness of breath   . Type II diabetes mellitus   . Neuromuscular disorder     neuropathy  . Coronary artery disease    Past Surgical History  Procedure Laterality Date  . Carotid endarterectomy Right 03/2009  . Cholecystectomy    . Tonsillectomy and adenoidectomy    . Knee arthroscopy Left     "I had 3" (11/05/2012)  . Maze  06/14/2011    Procedure: MAZE;  Surgeon: Gaye Pollack, MD;  Location: [place];  Service: Open Heart Surgery;  Laterality: N/A;  Ligate left atrial appendage  .  Circumcision    . Femoral-popliteal bypass graft Left 03/06/2012    Procedure: BYPASS GRAFT FEMORAL-POPLITEAL ARTERY;  Surgeon: Angelia Mould, MD;  [facility];  Service: Vascular;  Laterality: Left;  Left Femoral - Below Knee Popliteal Bypass Graft with Vein and Intraoperative Arteriogram.  . Total knee arthroplasty Left   . Lead revision  11/05/2012    pacemaker/notes 11/05/2012  . Coronary artery bypass graft  06/14/2011    Procedure: CORONARY ARTERY BYPASS GRAFTING (CABG);  Surgeon: Gaye Pollack, MD;  Location: [place];  Service: Open Heart Surgery;  Laterality: N/A;  Coronary Artery Bypass Graft on pump times six;  utilizing internal mammary artery and right greater saphenous vein harvested endoscopically.x 5 vessels  . Insert / replace / remove pacemaker  2013    /notes 11/05/2012-[employer]  . Joint replacement Left     left TKA  . Esophagogastroduodenoscopy (egd) with propofol N/A 02/17/2013    Procedure: ESOPHAGOGASTRODUODENOSCOPY (EGD) WITH PROPOFOL;  Surgeon: Jerene Bears, MD;  Location: [facility];  Service: Gastroenterology;  Laterality: N/A;  . Balloon dilation N/A 02/17/2013    Procedure: BALLOON DILATION;  Surgeon: Jerene Bears, MD;  Location: [facility];  Service: Gastroenterology;  Laterality: N/A;  .  Colonoscopy with propofol N/A 02/17/2013    Procedure: COLONOSCOPY WITH PROPOFOL;  Surgeon: Jerene Bears, MD;  Location: [facility];  Service: Gastroenterology;  Laterality: N/A;  . Permanent pacemaker insertion N/A 06/20/2011    Procedure: PERMANENT PACEMAKER INSERTION;  Surgeon: Deboraha Sprang, MD;  Location: [facility] CATH LAB;  Service: Cardiovascular;  Laterality: N/A;  . Abdominal aortagram N/A 01/07/2012    Procedure: ABDOMINAL Maxcine Ham;  Surgeon: Angelia Mould, MD;  Location: [facility] CATH LAB;  Service: Cardiovascular;  Laterality: N/A;  . Lead revision N/A 11/05/2012    Procedure: LEAD REVISION;  Surgeon: Deboraha Sprang, MD;  Location: [facility] CATH LAB;  Service:  Cardiovascular;  Laterality: N/A;  . Electrophysiology study N/A 11/05/2012    Procedure: ELECTROPHYSIOLOGY STUDY;  Surgeon: Deboraha Sprang, MD;  Location: [facility] CATH LAB;  Service: Cardiovascular;  Laterality: N/A;   History   Social History  . Marital Status: Married    Spouse Name: N/A  . Number of Children: N/A  . Years of Education: N/A   Social History Main Topics  . Smoking status: Former Smoker -- 3.00 packs/day for 32 years    Types: Cigarettes    Quit date: 01/16/1979  . Smokeless tobacco: Never Used  . Alcohol Use: 1.2 oz/week    2 Glasses of wine per week     Comment: beer 1-2 weekly (occasional)  . Drug Use: No  . Sexual Activity: Not Currently   Other Topics Concern  . None   Social History Narrative   Family History  Problem Relation Age of Onset  . Coronary artery disease Father   . Hypertension Father   . Heart disease Father     Heart Disease before age 61  . Heart attack Mother   . Coronary artery disease Mother   . Deep vein thrombosis Mother   . Heart disease Mother     Heart Disease before age 52  . Hyperlipidemia Mother   . Hypertension Mother   . Hypertension Sister   . Heart disease Sister     Heart Disease before age 60  . Hyperlipidemia Sister   . Anesthesia problems Neg Hx   . Heart disease Brother     Heart Disease before age 62  . Diabetes Neg Hx   . Colon cancer Neg Hx    Allergies  Allergen Reactions  . Lipitor [Atorvastatin Calcium] Other (See Comments)    Reaction unknown   Prior to Admission medications   Medication Sig Start Date End Date Taking? Authorizing Provider  acetaminophen (TYLENOL) 500 MG tablet Take 500 mg by mouth every 6 (six) hours as needed for mild pain or moderate pain.    Yes Historical Provider, MD  amoxicillin (AMOXIL) 500 MG capsule Take 500 mg by mouth 2 (two) times daily. Takes before dental appt   Yes Historical Provider, MD  apixaban (ELIQUIS) 5 MG TABS tablet Take 1 tablet (5 mg total) by mouth 2  (two) times daily. 02/01/14  Yes Deboraha Sprang, MD  carvedilol (COREG) 25 MG tablet TAKE 1 TABLET TWICE A DAY 02/19/14  Yes [name], MD  diphenhydramine-acetaminophen (TYLENOL PM) 25-500 MG TABS Take 1 tablet by mouth at bedtime as needed (sleep).    Yes Historical Provider, MD  furosemide (LASIX) 40 MG tablet Take 2 tablets (80 mg total) by mouth daily. 05/29/13  Yes [name], MD  glimepiride (AMARYL) 4 MG tablet Take 1 tablet (4 mg total) by mouth daily before breakfast. 07/13/13  Yes Renato Shin, MD  Iron Combinations (IRON COMPLEX PO) Take 2 capsules by mouth daily.   Yes Historical Provider, MD  KLOR-CON M20 20 MEQ tablet TAKE 1 TABLET BY MOUTH EVERY DAY 02/17/14  Yes [name], MD  L-Methylfolate-Algae-B12-B6 [facility]) 3-90.314-2-35 MG CAPS TAKE 1 CAPSULE BY MOUTH ONCE DAILY 02/12/14  Yes [name], MD  loratadine (CLARITIN) 10 MG tablet Take 10 mg by mouth daily.   Yes Historical Provider, MD  losartan (COZAAR) 100 MG tablet TAKE 1 TABLET BY MOUTH ONCE DAILY 05/13/13  Yes [name], MD  magnesium gluconate (MAGONATE) 500 MG tablet Take 500 mg by mouth daily.    Yes Historical Provider, MD  metFORMIN (GLUCOPHAGE) 1000 MG tablet Take 1 tablet by mouth every am and 1 1/2 tablets every pm 11/27/13  Yes Renato Shin, MD  Multiple Vitamin (MULTIVITAMIN WITH MINERALS) TABS Take 1 tablet by mouth daily.   Yes Historical Provider, MD  NOVOTWIST 32G X 5 MM MISC  06/12/13  Yes Historical Provider, MD  omeprazole (PRILOSEC) 20 MG capsule daily. 12/30/12  Yes Historical Provider, MD  Jonetta Speak LANCETS FINE MISC 1 each by other route daily. 1/day 250.62 06/10/13  Yes Renato Shin, MD  ONETOUCH VERIO test strip  06/10/13  Yes Historical Provider, MD  oxyCODONE (ROXICODONE) 5 MG immediate release tablet Take 1 tablet (5 mg total) by mouth every 4 (four) hours as needed for severe pain. 01/10/14  Yes Darlyne Russian, MD  oxymetazoline (AFRIN) 0.05 % nasal spray Place 2 sprays into the  nose daily as needed for congestion.   Yes Historical Provider, MD  pravastatin (PRAVACHOL) 80 MG tablet TAKE 1 TABLET BY MOUTH EVERY DAY 03/01/14  Yes Burtis Junes, NP  sucralfate (CARAFATE) 1 G tablet  02/06/14  Yes Historical Provider, MD  doxycycline (VIBRA-TABS) 100 MG tablet Take 1 tablet (100 mg total) by mouth 2 (two) times daily. Patient not taking: Reported on 04/06/2014 01/10/14   Darlyne Russian, MD  Liraglutide (VICTOZA) 18 MG/3ML SOPN Inject 1.8 mg into the skin daily. Patient not taking: Reported on 03/03/2014 07/13/13   Renato Shin, MD     ROS: The patient denies fevers, chills, night sweats, unintentional weight loss, chest pain, palpitations, wheezing, dyspnea on exertion, nausea, vomiting, abdominal pain, dysuria, hematuria, melena, numbness, weakness, or tingling.  All other systems have been reviewed and were otherwise negative with the exception of those mentioned in the HPI and as above.    PHYSICAL EXAM: Filed Vitals:   04/06/14 1321  BP: 120/78  Pulse: 83  Temp: 98.3 F (36.8 C)  Resp: 18   Filed Vitals:   04/06/14 1321  Height: 5' 10"  (1.778 m)  Weight: 242 lb (109.77 kg)   Body mass index is 34.72 kg/(m^2).  General: Alert, no acute distress, obese HEENT:  Normocephalic, atraumatic, oropharynx patent. EOMI, PERRLA Non-Erythematous throat, no exudates, TM normal, neg sinus tenderness, + erythematous/boggy nasal mucosa Cardiovascular:  Regular rate and rhythm, no rubs murmurs or gallops.  Radial pulse intact. No pedal edema.  Respiratory: +  Wheezes, no rales, or rhonchi.  No cyanosis, no use of accessory musculature GI: No organomegaly, abdomen is soft and non-tender, positive bowel sounds.  No masses. Skin: No rashes. Neurologic: Facial musculature symmetric. Psychiatric: Patient is appropriate throughout our interaction. Lymphatic: No cervical lymphadenopathy Musculoskeletal: Gait intact.   LABS: Results for orders placed or performed in visit on  01/10/14  POCT CBC  Result Value Ref Range  WBC 11.8 (A) 4.6 - 10.2 K/uL   Lymph, poc 2.0 0.6 - 3.4   POC LYMPH PERCENT 16.6 10 - 50 %L   MID (cbc) 0.7 0 - 0.9   POC MID % 6.1 0 - 12 %M   POC Granulocyte 9.1 (A) 2 - 6.9   Granulocyte percent 77.3 37 - 80 %G   RBC 5.65 4.69 - 6.13 M/uL   Hemoglobin 13.4 (A) 14.1 - 18.1 g/dL   HCT, POC 42.7 (A) 43.5 - 53.7 %   MCV 75.6 (A) 80 - 97 fL   MCH, POC 23.6 (A) 27 - 31.2 pg   MCHC 31.3 (A) 31.8 - 35.4 g/dL   RDW, POC 18.9 %   Platelet Count, POC 182 142 - 424 K/uL   MPV 8.7 0 - 99.8 fL  POCT glucose (manual entry)  Result Value Ref Range   POC Glucose 236 (A) 70 - 99 mg/dl     EKG/XRAY:   Primary read interpreted by Dr. Marin Comment at [facility]. Neg for any acute cardiopulmonary process, he has had some chronic bronchitic changes   ASSESSMENT/PLAN: Encounter Diagnoses  Name Primary?  . Cough   . Acute bronchitis, unspecified organism Yes   This is a pleasantly difficult 74 year old gentleman with a past medical history of CAD, atrial fib status post pacemaker , CAD status post CABG, type 2 diabetes who is here with a one-day history of worsening cough. He denies any CHF symptoms. X-ray does not show CHF. He declines any nebulizer treatments. I attempted to ask him to stay multiple times for nebulizer treatment but he declined. He will be given doxycycline, Mucinex without the DM, and also Hycodan syrup when necessary. Hopefully the doxycycline will cover for any strep/staph infection. Due to his cardiac history I hesitate to give any azithromycin at this time. He is not sure if he's had any. Precautions given due to his comorbidities. Follow-up as needed.  Gross sideeffects, risk and benefits, and alternatives of medications d/w patient. Patient is aware that all medications have potential sideeffects and we are unable to predict every sideeffect or drug-drug interaction that may occur.  Cashis Rill, [place], DO 04/06/2014 2:59 PM

## 2014-04-06 NOTE — Patient Instructions (Signed)
Acute Bronchitis °Bronchitis is inflammation of the airways that extend from the windpipe into the lungs (bronchi). The inflammation often causes mucus to develop. This leads to a cough, which is the most common symptom of bronchitis.  °In acute bronchitis, the condition usually develops suddenly and goes away over time, usually in a couple weeks. Smoking, allergies, and asthma can make bronchitis worse. Repeated episodes of bronchitis may cause further lung problems.  °CAUSES °Acute bronchitis is most often caused by the same virus that causes a cold. The virus can spread from person to person (contagious) through coughing, sneezing, and touching contaminated objects. °SIGNS AND SYMPTOMS  °· Cough.   °· Fever.   °· Coughing up mucus.   °· Body aches.   °· Chest congestion.   °· Chills.   °· Shortness of breath.   °· Sore throat.   °DIAGNOSIS  °Acute bronchitis is usually diagnosed through a physical exam. Your health care provider will also ask you questions about your medical history. Tests, such as chest X-rays, are sometimes done to rule out other conditions.  °TREATMENT  °Acute bronchitis usually goes away in a couple weeks. Oftentimes, no medical treatment is necessary. Medicines are sometimes given for relief of fever or cough. Antibiotic medicines are usually not needed but may be prescribed in certain situations. In some cases, an inhaler may be recommended to help reduce shortness of breath and control the cough. A cool mist vaporizer may also be used to help thin bronchial secretions and make it easier to clear the chest.  °HOME CARE INSTRUCTIONS °· Get plenty of rest.   °· Drink enough fluids to keep your urine clear or pale yellow (unless you have a medical condition that requires fluid restriction). Increasing fluids may help thin your respiratory secretions (sputum) and reduce chest congestion, and it will prevent dehydration.   °· Take medicines only as directed by your health care provider. °· If  you were prescribed an antibiotic medicine, finish it all even if you start to feel better. °· Avoid smoking and secondhand smoke. Exposure to cigarette smoke or irritating chemicals will make bronchitis worse. If you are a smoker, consider using nicotine gum or skin patches to help control withdrawal symptoms. Quitting smoking will help your lungs heal faster.   °· Reduce the chances of another bout of acute bronchitis by washing your hands frequently, avoiding people with cold symptoms, and trying not to touch your hands to your mouth, nose, or eyes.   °· Keep all follow-up visits as directed by your health care provider.   °SEEK MEDICAL CARE IF: °Your symptoms do not improve after 1 week of treatment.  °SEEK IMMEDIATE MEDICAL CARE IF: °· You develop an increased fever or chills.   °· You have chest pain.   °· You have severe shortness of breath. °· You have bloody sputum.   °· You develop dehydration. °· You faint or repeatedly feel like you are going to pass out. °· You develop repeated vomiting. °· You develop a severe headache. °MAKE SURE YOU:  °· Understand these instructions. °· Will watch your condition. °· Will get help right away if you are not doing well or get worse. °Document Released: 02/09/2004 Document Revised: 05/18/2013 Document Reviewed: 06/24/2012 °[employer]® Patient Information ©2015 [employer]. This information is not intended to replace advice given to you by your health care provider. Make sure you discuss any questions you have with your health care provider. ° °

## 2014-04-12 ENCOUNTER — Other Ambulatory Visit: Payer: Self-pay | Admitting: Family Medicine

## 2014-04-13 ENCOUNTER — Other Ambulatory Visit: Payer: Self-pay | Admitting: Family Medicine

## 2014-04-13 MED ORDER — HYDROCOD POLST-CHLORPHEN POLST 10-8 MG/5ML PO LQCR: 5.0000 mL | Freq: Two times a day (BID) | ORAL | Status: DC | PRN

## 2014-04-13 MED ORDER — IPRATROPIUM-ALBUTEROL 20-100 MCG/ACT IN AERS: 1.0000 | INHALATION_SPRAY | Freq: Four times a day (QID) | RESPIRATORY_TRACT | Status: DC | PRN

## 2014-05-02 ENCOUNTER — Other Ambulatory Visit: Payer: Self-pay | Admitting: Cardiovascular Disease

## 2014-05-12 ENCOUNTER — Other Ambulatory Visit: Payer: Self-pay | Admitting: Cardiovascular Disease

## 2014-05-13 ENCOUNTER — Other Ambulatory Visit: Payer: Self-pay | Admitting: Internal Medicine

## 2014-05-18 ENCOUNTER — Encounter: Payer: Self-pay | Admitting: *Deleted

## 2014-05-27 ENCOUNTER — Ambulatory Visit (INDEPENDENT_AMBULATORY_CARE_PROVIDER_SITE_OTHER): Payer: Federal, State, Local not specified - PPO | Admitting: *Deleted

## 2014-05-27 DIAGNOSIS — I495 Sick sinus syndrome: Secondary | ICD-10-CM | POA: Diagnosis not present

## 2014-05-27 DIAGNOSIS — I480 Paroxysmal atrial fibrillation: Secondary | ICD-10-CM

## 2014-05-27 DIAGNOSIS — I498 Other specified cardiac arrhythmias: Secondary | ICD-10-CM | POA: Diagnosis not present

## 2014-05-28 MED ORDER — APIXABAN 5 MG PO TABS: 5.0000 mg | ORAL_TABLET | Freq: Two times a day (BID) | ORAL | Status: DC

## 2014-06-03 ENCOUNTER — Other Ambulatory Visit: Payer: Self-pay | Admitting: Nurse Practitioner

## 2014-06-10 LAB — CUP PACEART INCLINIC DEVICE CHECK
Battery Impedance: 235 Ohm
Battery Remaining Longevity: 99 mo
Battery Voltage: 2.78 V
Brady Statistic AP VP Percent: 86 %
Brady Statistic AP VS Percent: 8 %
Brady Statistic AS VS Percent: 2 %
Date Time Interrogation Session: 20160512200238
Lead Channel Impedance Value: 422 Ohm
Lead Channel Impedance Value: 655 Ohm
Lead Channel Pacing Threshold Amplitude: 0.75 V
Lead Channel Pacing Threshold Pulse Width: 0.4 ms
Lead Channel Pacing Threshold Pulse Width: 0.4 ms
Lead Channel Sensing Intrinsic Amplitude: 11.2 mV
Lead Channel Setting Pacing Amplitude: 2 V
Lead Channel Setting Pacing Amplitude: 2.5 V
Lead Channel Setting Sensing Sensitivity: 2.8 mV
MDC IDC MSMT LEADCHNL RA PACING THRESHOLD AMPLITUDE: 1 V
MDC IDC SET LEADCHNL RV PACING PULSEWIDTH: 0.4 ms
MDC IDC STAT BRADY AS VP PERCENT: 4 %

## 2014-06-10 NOTE — Progress Notes (Signed)
Pacemaker check in clinic. Normal device function. RV threshold, sensing, impedances consistent with previous measurements. RA threshold remains indeterminable---no changes made in output. Device programmed to maximize longevity. 2,642 atrial high rate episodes + Eliquis. No high ventricular rates noted. Device programmed at appropriate safety margins. Histogram distribution appropriate for patient activity level. Device programmed to optimize intrinsic conduction. Estimated longevity 8 years. Patient will follow up with SK in 6 months.

## 2014-07-09 ENCOUNTER — Telehealth: Payer: Self-pay | Admitting: *Deleted

## 2014-07-09 NOTE — Telephone Encounter (Signed)
Patient/wife had a question about whether or not pt could d/c Eliquis since he is no longer involved with the [employer]. Informed wife that per SK pt needs to continue taking the Eliquis due to his PAF. Wife voiced understanding.

## 2014-07-21 ENCOUNTER — Encounter: Payer: Self-pay | Admitting: Internal Medicine

## 2014-07-29 ENCOUNTER — Other Ambulatory Visit: Payer: Self-pay | Admitting: Internal Medicine

## 2014-07-29 NOTE — Telephone Encounter (Signed)
Error encounter. 

## 2014-08-17 ENCOUNTER — Other Ambulatory Visit: Payer: Self-pay | Admitting: Cardiovascular Disease

## 2014-08-18 ENCOUNTER — Other Ambulatory Visit: Payer: Self-pay | Admitting: Cardiovascular Disease

## 2014-08-22 ENCOUNTER — Other Ambulatory Visit: Payer: Self-pay | Admitting: Cardiovascular Disease

## 2014-09-01 ENCOUNTER — Other Ambulatory Visit (HOSPITAL_COMMUNITY): Payer: Federal, State, Local not specified - PPO

## 2014-09-01 ENCOUNTER — Ambulatory Visit: Payer: Federal, State, Local not specified - PPO | Admitting: Family

## 2014-09-24 ENCOUNTER — Other Ambulatory Visit: Payer: Self-pay | Admitting: Cardiovascular Disease

## 2014-10-08 ENCOUNTER — Ambulatory Visit (INDEPENDENT_AMBULATORY_CARE_PROVIDER_SITE_OTHER): Payer: Federal, State, Local not specified - PPO | Admitting: Cardiovascular Disease

## 2014-10-08 ENCOUNTER — Encounter: Payer: Self-pay | Admitting: Cardiovascular Disease

## 2014-10-08 VITALS — BP 134/82 | HR 95 | Ht 69.5 in | Wt 238.8 lb

## 2014-10-08 DIAGNOSIS — E785 Hyperlipidemia, unspecified: Secondary | ICD-10-CM

## 2014-10-08 DIAGNOSIS — I2510 Atherosclerotic heart disease of native coronary artery without angina pectoris: Secondary | ICD-10-CM

## 2014-10-08 DIAGNOSIS — I480 Paroxysmal atrial fibrillation: Secondary | ICD-10-CM | POA: Diagnosis not present

## 2014-10-08 MED ORDER — APIXABAN 5 MG PO TABS: 5.0000 mg | ORAL_TABLET | Freq: Two times a day (BID) | ORAL | Status: DC

## 2014-10-08 NOTE — Patient Instructions (Addendum)
Medication Instructions:  Your physician recommends that you continue on your current medications as directed. Please refer to the Current Medication list given to you today.   Labwork: Your physician recommends that you return for a FASTING lipid profile, bmet, lft on 10/11/14  Your physician recommends that you return for a FASTING lipid profile, bmet, lft in 1 year  Testing/Procedures: None ordreded  Follow-Up: Your physician wants you to follow-up in: 1 year with Dr.Nahser You will receive a reminder letter in the mail two months in advance. If you don't receive a letter, please call our office to schedule the follow-up appointment.   Any Other Special Instructions Will Be Listed Below (If Applicable).

## 2014-10-08 NOTE — Progress Notes (Signed)
[name] Date of Birth  1940-08-31 [place]  [address]    [place]   [place], [place]  [address]    [place], [place]  [address] [phone]  Fax  [phone]  [phone]  Fax [phone]  Problem list:  1. Atrial fibrillation- - s/p MAZE procedure  -   2. Hypertension 3. Diabetes mellitus 4. Dyslipidemia 5. Peripheral vascular disease:-Status post right carotid endarterectomy and status post PTA by Dr. Sherren Mocha 6. History of GI bleed-currently not on antiplatelet agents or anticoagulants-,  he was hospitalized July 30, 2011 for GI bleed.  Was on Xarelto, ASA, plavix.  Xarelto and plavx were held. 7. Anemia 8. History of TIA 9. Pacer 10 CABG - MAZE - Jun 12, 2011  History of Present Illness:  Jamorris  Is a 74 y.o. gentleman with a history as noted above. He had coronary artery bypass grafting in May of this year. He also had a Maze procedure.  April 23, 2012: Hilmar presents today after seeing Dr. wall in the office several weeks ago. He had vascular surgery on his left leg. Since that time he's had significant edema. There is no significant edema on the right leg. He's tried elevating his leg but has had minimal success. His Lasix was increased.  He has been able to diurese about 5 pounds since that time.    He's had some shortness of breath climbing stairs. He's not any chest pains. He did not sleep well last night. This may explain his elevated blood pressure.    Jun 04, 2012: Shaunn was seen  In April for leg edema.  He has some infection associated with his vascular surgery leg would.  He saw Dr Scot Dock and his wound was debrided.   Echo revealed LV function.    He has done better on the Lasix 80 mg a day.  We have tried Lasix 40 mg a day but his dyspnea is worse.  He had CABG in May 2013.    Nov. 19, 2014; Ladarious is very short of breath today.  He had a pacer revision ( revised atrial lead) by Dr.  Caryl Comes in Oct.  He has not noticed any improvement after the pacer revision.  He had GI bleeding and was seen by Dr. Birdena Jubilee.  He has not seen a GI doctor.     His breathing is much worse over the past several months.    He stopped his apirin this week.   Feb. 12, 2015: He has had some GI  bleeding .  Hb dropped.   EGD and colonoscopy did not show anything specific.  He had a Lexiscan myoview this week which was low risk for ischemia. His left ventricular systolic function is normal with an EF of 53%.   His EKG from 02/16/2013 reveals atrial /  ventricular pacing.     10/08/2014:  Doing well. Stays busy.  Not much regular exercise.   Retired from the [employer] office  On Eliquis - no blood in stool.     No CP , no dyspnea.    Current Outpatient Prescriptions on File Prior to Visit  Medication Sig Dispense Refill  . acetaminophen (TYLENOL) 500 MG tablet Take 500 mg by mouth every 6 (six) hours as needed for mild pain or moderate pain.     Marland Kitchen apixaban (ELIQUIS) 5 MG TABS tablet Take 1 tablet (5 mg total) by mouth 2 (  two) times daily. 60 tablet 0  . carvedilol (COREG) 25 MG tablet Take 1 tablet (25 mg total) by mouth 2 (two) times daily. 60 tablet 0  . chlorpheniramine-HYDROcodone (TUSSIONEX PENNKINETIC ER) 10-8 MG/5ML LQCR Take 5 mLs by mouth every 12 (twelve) hours as needed for cough. 120 mL 0  . Cholecalciferol (VITAMIN D3) 2000 UNITS TABS Take 2,000 Units by mouth daily.    . diphenhydramine-acetaminophen (TYLENOL PM) 25-500 MG TABS Take 1 tablet by mouth at bedtime as needed (sleep).     . famotidine (PEPCID) 20 MG tablet Take 20 mg by mouth daily.    . furosemide (LASIX) 40 MG tablet Take 2 tablets (80 mg total) by mouth daily. 30 tablet 3  . Garlic 3299 MG CAPS Take 2,000 mg by mouth daily.    Marland Kitchen glipiZIDE (GLUCOTROL) 10 MG tablet Take 10 mg by mouth daily before breakfast.    . Iron Combinations (IRON COMPLEX PO) Take 2 capsules by mouth daily.    Marland Kitchen KLOR-CON M20 20 MEQ tablet TAKE 1 TABLET  BY MOUTH EVERY DAY 30 tablet 6  . L-Methylfolate-Algae-B12-B6 (METANX) 3-90.314-2-35 MG CAPS TAKE 1 CAPSULE BY MOUTH ONCE DAILY 60 capsule 3  . loratadine (CLARITIN) 10 MG tablet Take 10 mg by mouth daily.    Marland Kitchen losartan (COZAAR) 100 MG tablet TAKE 1 TABLET BY MOUTH EVERY DAY 90 tablet 1  . Magnesium Oxide 420 MG TABS Take 420 mg by mouth daily.    . Multiple Vitamin (MULTIVITAMIN WITH MINERALS) TABS Take 1 tablet by mouth daily.    Marland Kitchen NOVOTWIST 32G X 5 MM MISC     . omeprazole (PRILOSEC) 20 MG capsule Take 20 mg by mouth daily.     Glory Rosebush DELICA LANCETS FINE MISC 1 each by other route daily. 1/day 250.62 100 each 3  . ONETOUCH VERIO test strip     . oxymetazoline (AFRIN) 0.05 % nasal spray Place 2 sprays into the nose daily as needed for congestion.    . pravastatin (PRAVACHOL) 80 MG tablet TAKE 1 TABLET BY MOUTH EVERY DAY 30 tablet 4   No current facility-administered medications on file prior to visit.    Allergies  Allergen Reactions  . Lipitor [Atorvastatin Calcium] Other (See Comments)    Reaction unknown    Past Medical History  Diagnosis Date  . A-fib   . HTN (hypertension)   . Dyslipidemia   . PVD (peripheral vascular disease)     s/p Right CEA March, 2011,   . Claudication   . Hyperlipidemia   . GERD (gastroesophageal reflux disease)   . Diabetic neuropathy     mostly feet/ legs  . Pacemaker   . Stroke     ?  Marland Kitchen Anemia     ?  . Diastolic CHF 02/18/2681  . Exertional shortness of breath   . Type II diabetes mellitus   . Neuromuscular disorder     neuropathy  . Coronary artery disease     Past Surgical History  Procedure Laterality Date  . Carotid endarterectomy Right 03/2009  . Cholecystectomy    . Tonsillectomy and adenoidectomy    . Knee arthroscopy Left     "I had 3" (11/05/2012)  . Maze  06/14/2011    Procedure: MAZE;  Surgeon: Gaye Pollack, MD;  Location: [facility];  Service: Open Heart Surgery;  Laterality: N/A;  Ligate left atrial appendage  .  Circumcision    . Femoral-popliteal bypass graft Left 03/06/2012    Procedure:  BYPASS GRAFT FEMORAL-POPLITEAL ARTERY;  Surgeon: Angelia Mould, MD;  Location[facility];  Service: Vascular;  Laterality: Left;  Left Femoral - Below Knee Popliteal Bypass Graft with Vein and Intraoperative Arteriogram.  . Total knee arthroplasty Left   . Lead revision  11/05/2012    pacemaker/notes 11/05/2012  . Coronary artery bypass graft  06/14/2011    Procedure: CORONARY ARTERY BYPASS GRAFTING (CABG);  Surgeon: Gaye Pollack, MD;  Location: [facility];  Service: Open Heart Surgery;  Laterality: N/A;  Coronary Artery Bypass Graft on pump times six;  utilizing internal mammary artery and right greater saphenous vein harvested endoscopically.x 5 vessels  . Insert / replace / remove pacemaker  2013    /notes 11/05/2012-[employer]  . Joint replacement Left     left TKA  . Esophagogastroduodenoscopy (egd) with propofol N/A 02/17/2013    Procedure: ESOPHAGOGASTRODUODENOSCOPY (EGD) WITH PROPOFOL;  Surgeon: Jerene Bears, MD;  Location: [facility];  Service: Gastroenterology;  Laterality: N/A;  . Balloon dilation N/A 02/17/2013    Procedure: BALLOON DILATION;  Surgeon: Jerene Bears, MD;  Location: [facility];  Service: Gastroenterology;  Laterality: N/A;  . Colonoscopy with propofol N/A 02/17/2013    Procedure: COLONOSCOPY WITH PROPOFOL;  Surgeon: Jerene Bears, MD;  Location: [facility];  Service: Gastroenterology;  Laterality: N/A;  . Permanent pacemaker insertion N/A 06/20/2011    Procedure: PERMANENT PACEMAKER INSERTION;  Surgeon: Deboraha Sprang, MD;  Location: [facility] CATH LAB;  Service: Cardiovascular;  Laterality: N/A;  . Abdominal aortagram N/A 01/07/2012    Procedure: ABDOMINAL Maxcine Ham;  Surgeon: Angelia Mould, MD;  Location: [facility] CATH LAB;  Service: Cardiovascular;  Laterality: N/A;  . Lead revision N/A 11/05/2012    Procedure: LEAD REVISION;  Surgeon: Deboraha Sprang, MD;  Location: [facility] CATH LAB;  Service:  Cardiovascular;  Laterality: N/A;  . Electrophysiology study N/A 11/05/2012    Procedure: ELECTROPHYSIOLOGY STUDY;  Surgeon: Deboraha Sprang, MD;  Location: [facility] CATH LAB;  Service: Cardiovascular;  Laterality: N/A;    History  Smoking status  . Former Smoker -- 3.00 packs/day for 32 years  . Types: Cigarettes  . Quit date: 01/16/1979  Smokeless tobacco  . Never Used    History  Alcohol Use  . 1.2 oz/week  . 2 Glasses of wine per week    Comment: beer 1-2 weekly (occasional)    Family History  Problem Relation Age of Onset  . Coronary artery disease Father   . Hypertension Father   . Heart disease Father     Heart Disease before age 75  . Heart attack Mother   . Coronary artery disease Mother   . Deep vein thrombosis Mother   . Heart disease Mother     Heart Disease before age 61  . Hyperlipidemia Mother   . Hypertension Mother   . Hypertension Sister   . Heart disease Sister     Heart Disease before age 27  . Hyperlipidemia Sister   . Anesthesia problems Neg Hx   . Heart disease Brother     Heart Disease before age 64  . Diabetes Neg Hx   . Colon cancer Neg Hx     Reviw of Systems:  Reviewed in the HPI.  All other systems are negative.  Physical Exam: BP 134/82 mmHg  Pulse 95  Ht 5' 9.5" (1.765 m)  Wt 108.319 kg (238 lb 12.8 oz)  BMI 34.77 kg/m2 The patient is alert and oriented x 3.  The mood and  affect are normal.   Skin: warm and dry.      HEENT:   Normocephalic/atraumatic. He has a right carotid endarterectomy scar. His carotids are 2+. No JVD. His neck is supple. The membranes are moist.  Lungs: Lungs are clear   Heart: Regular rate. No murmurs.  His sternotomy scar is healing well. His thoracostomy sites are healing well.  Abdomen: Good bowel sounds. , NT   Extremities:  No  edema  Neuro:  Neuro exam is nonfocal.    ECG: 10/08/2014: 80 paced at 95 beats a minute. No changes.  Assessment / Plan:   1. Atrial fibrillation- - s/p MAZE  procedure  -  - has not been followed by Dr. Caryl Comes. Continue Eliquis. 2. Hypertension-   blood pressure is well-controlled. Continue current medications. 3. Diabetes mellitus 4. Dyslipidemia 5. Peripheral vascular disease:-Status post right carotid endarterectomy and status post PTA by Dr. Sherren Mocha 6. History of GI bleed- doing well on Eliquis 7. Anemia 8. History of TIA 9. Pacer 10 CABG - MAZE - Jun 12, 2011 -   recent episodes of angina. Continue current medications.    Nahser, Wonda Cheng, MD  10/08/2014 11:05 AM    [address],  [place], [place]  [address] Pager [phone] Phone: [phone]; Fax: [phone]   [facility]  [address] [place], [place]  [address] [phone]   Fax [phone]

## 2014-10-11 ENCOUNTER — Other Ambulatory Visit (INDEPENDENT_AMBULATORY_CARE_PROVIDER_SITE_OTHER): Payer: Federal, State, Local not specified - PPO | Admitting: *Deleted

## 2014-10-11 DIAGNOSIS — E785 Hyperlipidemia, unspecified: Secondary | ICD-10-CM | POA: Diagnosis not present

## 2014-10-11 DIAGNOSIS — I2510 Atherosclerotic heart disease of native coronary artery without angina pectoris: Secondary | ICD-10-CM | POA: Diagnosis not present

## 2014-10-11 LAB — HEPATIC FUNCTION PANEL
ALK PHOS: 86 U/L (ref 39–117)
ALT: 30 U/L (ref 0–53)
AST: 28 U/L (ref 0–37)
Albumin: 4.2 g/dL (ref 3.5–5.2)
Bilirubin, Direct: 0.2 mg/dL (ref 0.0–0.3)
TOTAL PROTEIN: 7.7 g/dL (ref 6.0–8.3)
Total Bilirubin: 0.8 mg/dL (ref 0.2–1.2)

## 2014-10-11 LAB — LIPID PANEL
Cholesterol: 135 mg/dL (ref 0–200)
HDL: 35.6 mg/dL — ABNORMAL LOW (ref >39.00)
LDL Cholesterol: 67 mg/dL (ref 0–99)
NonHDL: 99.26
TRIGLYCERIDES: 160 mg/dL — AB (ref 0.0–149.0)
Total CHOL/HDL Ratio: 4
VLDL: 32 mg/dL (ref 0.0–40.0)

## 2014-10-11 LAB — BASIC METABOLIC PANEL
BUN: 26 mg/dL — ABNORMAL HIGH (ref 6–23)
CALCIUM: 9.6 mg/dL (ref 8.4–10.5)
CO2: 27 mEq/L (ref 19–32)
CREATININE: 1.54 mg/dL — AB (ref 0.40–1.50)
Chloride: 100 mEq/L (ref 96–112)
GFR: 47.39 mL/min — AB (ref >60.00)
Glucose, Bld: 200 mg/dL — ABNORMAL HIGH (ref 70–99)
Potassium: 4.8 mEq/L (ref 3.5–5.1)
Sodium: 137 mEq/L (ref 135–145)

## 2014-10-15 ENCOUNTER — Other Ambulatory Visit: Payer: Self-pay | Admitting: Internal Medicine

## 2014-10-19 ENCOUNTER — Encounter: Payer: Self-pay | Admitting: Emergency Medicine

## 2014-10-24 ENCOUNTER — Other Ambulatory Visit: Payer: Self-pay | Admitting: Cardiovascular Disease

## 2014-11-02 ENCOUNTER — Ambulatory Visit (INDEPENDENT_AMBULATORY_CARE_PROVIDER_SITE_OTHER): Payer: Federal, State, Local not specified - PPO | Admitting: Family Medicine

## 2014-11-02 VITALS — BP 160/100 | HR 92 | Temp 97.9°F | Resp 16 | Ht 70.0 in | Wt 238.0 lb

## 2014-11-02 DIAGNOSIS — J9801 Acute bronchospasm: Secondary | ICD-10-CM | POA: Diagnosis not present

## 2014-11-02 DIAGNOSIS — J45901 Unspecified asthma with (acute) exacerbation: Secondary | ICD-10-CM | POA: Diagnosis not present

## 2014-11-02 DIAGNOSIS — R05 Cough: Secondary | ICD-10-CM | POA: Diagnosis not present

## 2014-11-02 DIAGNOSIS — E119 Type 2 diabetes mellitus without complications: Secondary | ICD-10-CM

## 2014-11-02 DIAGNOSIS — R059 Cough, unspecified: Secondary | ICD-10-CM

## 2014-11-02 MED ORDER — ALBUTEROL SULFATE (2.5 MG/3ML) 0.083% IN NEBU
2.5000 mg | INHALATION_SOLUTION | Freq: Once | RESPIRATORY_TRACT | Status: AC
  Administered 2014-11-02: 2.5 mg via RESPIRATORY_TRACT

## 2014-11-02 MED ORDER — HYDROCODONE-HOMATROPINE 5-1.5 MG/5ML PO SYRP
5.0000 mL | ORAL_SOLUTION | ORAL | Status: DC | PRN
Start: 2014-11-02 — End: 2014-11-15

## 2014-11-02 MED ORDER — CEFDINIR 300 MG PO CAPS: 600.0000 mg | ORAL_CAPSULE | Freq: Every day | ORAL | Status: DC

## 2014-11-02 MED ORDER — ALBUTEROL SULFATE HFA 108 (90 BASE) MCG/ACT IN AERS: 2.0000 | INHALATION_SPRAY | RESPIRATORY_TRACT | Status: DC | PRN

## 2014-11-02 MED ORDER — BENZONATATE 100 MG PO CAPS
100.0000 mg | ORAL_CAPSULE | Freq: Three times a day (TID) | ORAL | Status: DC | PRN
Start: 2014-11-02 — End: 2014-11-11

## 2014-11-02 NOTE — Progress Notes (Signed)
Patient ID: Edward [name], male    DOB: 1940-06-20  Age: 74 y.o. MRN: [id number]  Chief Complaint  Patient presents with  . Cough    x 1 week    Subjective:   74 year old man whose been having a cough for the past week. It's fairly nonproductive. He is short of breath from it. He has a history of heart disease and CHF in the past, that has been controlled. He has these episodes several times a year. He does not smoke. He still works although retired. He is diabetic, does not check his sugars. He is managed by the [place]. He says his last A1c was about 6 he thinks. He has been coughing nonstop day and night the last few days. He has not been aware of any wheezing.  Current allergies, medications, problem list, past/family and social histories reviewed.  Objective:  BP 160/100 mmHg  Pulse 92  Temp(Src) 97.9 F (36.6 C) (Oral)  Resp 16  Ht 5' 10"  (1.778 m)  Wt 238 lb (107.956 kg)  BMI 34.15 kg/m2  SpO2 98%  Fairly persistent cough. Nonproductive. TMs normal. Nose mildly congested. Throat clear. Neck supple without nodes. Chest clear to auscultation without wheezing, rales, or rhonchi. He does have a sternotomy scar from a CABG. He has decreased air movement on forced expiration, though still no wheeze. Minimal ankle edema, tender ankles.  Assessment & Plan:   Assessment: 1. Cough   2. Bronchospasm   3. Asthmatic bronchitis with acute exacerbation   4. Type 2 diabetes mellitus without complication, without long-term current use of insulin ([place])       Plan: Patient was given an albuterol neb treatment. It did not help him substantially. The peak flow went from 380 up to 400. He continues to cough. Will treat with cough medications and antibiotics. Did not order an x-ray and labs today. He is to come back if he is not improving.  No orders of the defined types were placed in this encounter.    Meds ordered this encounter  Medications  . albuterol (PROVENTIL) (2.5 MG/3ML) 0.083%  nebulizer solution 2.5 mg    Sig:   . HYDROcodone-homatropine (HYCODAN) 5-1.5 MG/5ML syrup    Sig: Take 5 mLs by mouth every 4 (four) hours as needed.    Dispense:  120 mL    Refill:  0  . benzonatate (TESSALON) 100 MG capsule    Sig: Take 1-2 capsules (100-200 mg total) by mouth 3 (three) times daily as needed.    Dispense:  30 capsule    Refill:  0  . cefdinir (OMNICEF) 300 MG capsule    Sig: Take 2 capsules (600 mg total) by mouth daily.    Dispense:  20 capsule    Refill:  0  . albuterol (PROVENTIL HFA;VENTOLIN HFA) 108 (90 BASE) MCG/ACT inhaler    Sig: Inhale 2 puffs into the lungs every 4 (four) hours as needed for wheezing or shortness of breath (cough, shortness of breath or wheezing.).    Dispense:  1 Inhaler    Refill:  1         Patient Instructions  Drink plenty of fluids to keep well hydrated. That helps keep secretions thin her.  Take the cough syrup 1 teaspoon every 4-6 hours as needed. This will tend to make you drowsy.  Take the cough pills if needed for daytime cough when you don't want to feel drowsy from the cough syrup  Use the albuterol inhaler 2 inhalations  every 4-6 hours  Take the Omnicef (cefdinir) antibiotic 300 mg 1 pill twice daily  Monitor your diabetes  Return if worse or not improving over the next couple of days    Return if symptoms worsen or fail to improve.   Manuelita Moxon, MD 11/02/2014

## 2014-11-02 NOTE — Patient Instructions (Signed)
Drink plenty of fluids to keep well hydrated. That helps keep secretions thin her.  Take the cough syrup 1 teaspoon every 4-6 hours as needed. This will tend to make you drowsy.  Take the cough pills if needed for daytime cough when you don't want to feel drowsy from the cough syrup  Use the albuterol inhaler 2 inhalations every 4-6 hours  Take the Omnicef (cefdinir) antibiotic 300 mg 1 pill twice daily  Monitor your diabetes  Return if worse or not improving over the next couple of days

## 2014-11-03 ENCOUNTER — Other Ambulatory Visit: Payer: Self-pay | Admitting: Internal Medicine

## 2014-11-05 ENCOUNTER — Other Ambulatory Visit: Payer: Self-pay | Admitting: Internal Medicine

## 2014-11-07 ENCOUNTER — Other Ambulatory Visit: Payer: Self-pay | Admitting: Internal Medicine

## 2014-11-11 ENCOUNTER — Telehealth: Payer: Self-pay

## 2014-11-11 ENCOUNTER — Other Ambulatory Visit: Payer: Self-pay | Admitting: Family Medicine

## 2014-11-11 DIAGNOSIS — J45901 Unspecified asthma with (acute) exacerbation: Secondary | ICD-10-CM

## 2014-11-11 DIAGNOSIS — R05 Cough: Secondary | ICD-10-CM

## 2014-11-11 DIAGNOSIS — R059 Cough, unspecified: Secondary | ICD-10-CM

## 2014-11-11 NOTE — Telephone Encounter (Signed)
Patient's spouse is calling to request a refill for hydrocodone syrup. Please call when ready for pick up!

## 2014-11-12 NOTE — Telephone Encounter (Signed)
Can someone else please review this req in Dr Hopper's absence?

## 2014-11-15 MED ORDER — HYDROCODONE-HOMATROPINE 5-1.5 MG/5ML PO SYRP: 5.0000 mL | ORAL_SOLUTION | ORAL | Status: DC | PRN

## 2014-11-15 NOTE — Telephone Encounter (Signed)
Please alert patient that medication is filled and sitting in [facility] box for pick up

## 2014-11-15 NOTE — Telephone Encounter (Signed)
Meds ordered this encounter  Medications  . benzonatate (TESSALON) 100 MG capsule    Sig: TAKE 1 TO 2 CAPSULES BY MOUTH 3 TIMES DAILY AS NEEDED    Dispense:  30 capsule    Refill:  0    Barbara, The assignments for in-basket coverage is on the dry-erase board in the provider lounge.  -Koryn Charlot

## 2014-11-15 NOTE — Telephone Encounter (Signed)
I'm not sure who is checking Dr [employer] box, so will send to PA pool also.

## 2014-11-15 NOTE — Telephone Encounter (Signed)
Pt's wife notified.

## 2014-11-15 NOTE — Telephone Encounter (Signed)
I doesn't look like anyone saw this in Dr [employer] box. I will send to PA pool.

## 2014-11-24 ENCOUNTER — Telehealth: Payer: Self-pay

## 2014-11-24 DIAGNOSIS — M79605 Pain in left leg: Secondary | ICD-10-CM

## 2014-11-24 DIAGNOSIS — M7989 Other specified soft tissue disorders: Secondary | ICD-10-CM

## 2014-11-24 NOTE — Telephone Encounter (Signed)
Discussed pt's symptoms with Dr. Scot Dock.  Recommended pt. To have a Left LE Arterial Duplex to rule out DVT.  Phone call to wife.  Offered appt. 11/10 in afternoon.  Stated the pt. Can't come tomorrow afternoon, due to [facility].  Requested to get an appt. On Friday, 11/11.  Advised will have a scheduler call back with appt. Options.  Verb. Understanding.

## 2014-11-24 NOTE — Telephone Encounter (Signed)
Rec'd voice msg from pt's wife on 11/7, that pt. was c/o left leg swelling, soreness, and difficulty walking.  Attempted to return call x 2 and left voice messages to return call to office.  Contacted pt. today at 11:45 AM.  He reported that 3 days ago, he had onset of swelling from below the left knee to top of left foot, with "soreness of tissue".  Denied redness or inflammation. Denied any open sores.  Today, reported "the majority of the swelling has gone."  Stated that he "could feel something coming on recently, with discomfort in the left leg, during standing and walking."  Reported hx of having episodes of gout in the right knee and foot about 6 mos. ago, and questioned if this was related to gout.  Today, denied rest pain.  Stated the symptoms from the past 3 days "have gradually gotten better."  Next f/u appt. Is scheduled in 02/2015.  Advised pt. Will discuss with Dr. Scot Dock, and will contact him with further recommendations.  Agreed.

## 2014-11-24 NOTE — Telephone Encounter (Signed)
LM for pts wife- asked to cb asap to confirm tomorrow's appointment or to leave a message with the answering service.

## 2014-11-25 ENCOUNTER — Ambulatory Visit (INDEPENDENT_AMBULATORY_CARE_PROVIDER_SITE_OTHER): Payer: Federal, State, Local not specified - PPO | Admitting: Family

## 2014-11-25 ENCOUNTER — Ambulatory Visit (HOSPITAL_COMMUNITY)
Admission: RE | Admit: 2014-11-25 | Discharge: 2014-11-25 | Disposition: A | Discharge status: Home / Self Care | Payer: Federal, State, Local not specified - PPO | Source: Ambulatory Visit | Attending: Family | Admitting: Family

## 2014-11-25 ENCOUNTER — Encounter: Payer: Self-pay | Admitting: Family

## 2014-11-25 VITALS — BP 153/85 | HR 92 | Ht 70.0 in | Wt 238.0 lb

## 2014-11-25 DIAGNOSIS — Z87891 Personal history of nicotine dependence: Secondary | ICD-10-CM

## 2014-11-25 DIAGNOSIS — M79605 Pain in left leg: Secondary | ICD-10-CM | POA: Diagnosis not present

## 2014-11-25 DIAGNOSIS — Z95828 Presence of other vascular implants and grafts: Secondary | ICD-10-CM

## 2014-11-25 DIAGNOSIS — E1151 Type 2 diabetes mellitus with diabetic peripheral angiopathy without gangrene: Secondary | ICD-10-CM

## 2014-11-25 DIAGNOSIS — Z9889 Other specified postprocedural states: Secondary | ICD-10-CM

## 2014-11-25 DIAGNOSIS — M7989 Other specified soft tissue disorders: Secondary | ICD-10-CM | POA: Insufficient documentation

## 2014-11-25 NOTE — Progress Notes (Signed)
Referred by:  Darlyne Russian, MD [address] [place], [place] [address]  Reason for referral: Swollen left leg  History of Present Illness  Edward [name] is a 74 y.o. (1940/04/16) male who presents with chief complaint: left calf pain and swelling that started 5 days ago. He has been elevating lis left leg, taking Tylenol, and the swelling and pain have subsided. He wonders if it is gout that he has had in both legs. He is currently treated for bronchitis.  He is due to return for carotid Duplex, ABI's, and left LE arterial Duplex in February 2017  He is a patient of Dr. Scot Dock who is s/p left femoropopliteal bypass graft with vein on 03/06/2012.  He is also s/p right CEA on 03/22/09.   He denies claudication symptoms, denies non-healing wounds.  He had slurred speech for about a day, in 2012, no further TIA or stroke symptoms.  He states that his systolic blood pressure is usually 140-150's.  He states his recent stress test was normal. 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   Pt Diabetic: Yes, Review of records: his A1C in September 2015 was 6.9%, in control  Pt smoker: former smoker, quit in 1981   Pt meds include:  Statin :Yes  Betablocker: Yes  ASA: No, stopped when Eliquis started in October 2015  Other anticoagulants/antiplatelets: Eliquis for atrial fibrillation, (review of records) started after his October 2015 visit with his cardiologist, Dr. Caryl Comes. 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   Past Medical History  Diagnosis Date  . A-fib ([place])   . HTN (hypertension)   . Dyslipidemia   . PVD (peripheral vascular disease) ([place])     s/p Right CEA March, 2011,   . Claudication ([place])   . Hyperlipidemia   . GERD (gastroesophageal reflux disease)   . Diabetic neuropathy (HCC)     mostly feet/ legs  . Pacemaker   . Stroke [facility])     ?  Marland Kitchen Anemia     ?  . Diastolic CHF ([place]) 06/20/6810  . Exertional shortness of breath   .  Type II diabetes mellitus ([place])   . Neuromuscular disorder (HCC)     neuropathy  . Coronary artery disease     Past Surgical History  Procedure Laterality Date  . Carotid endarterectomy Right 03/2009  . Cholecystectomy    . Tonsillectomy and adenoidectomy    . Knee arthroscopy Left     "I had 3" (11/05/2012)  . Maze  06/14/2011    Procedure: MAZE;  Surgeon: Gaye Pollack, MD;  Location: [place];  Service: Open Heart Surgery;  Laterality: N/A;  [place] left atrial appendage  . Circumcision    . Femoral-popliteal bypass graft Left 03/06/2012    Procedure: BYPASS GRAFT FEMORAL-POPLITEAL ARTERY;  Surgeon: Angelia Mould, MD;  Location: [facility] OR;  Service: Vascular;  Laterality: Left;  Left Femoral - Below Knee Popliteal Bypass Graft with Vein and Intraoperative Arteriogram.  . Total knee arthroplasty Left   . Lead revision  11/05/2012    pacemaker/notes 11/05/2012  . Coronary artery bypass graft  06/14/2011    Procedure: CORONARY ARTERY BYPASS GRAFTING (CABG);  Surgeon: Gaye Pollack, MD;  Location: [place];  Service: Open Heart Surgery;  Laterality: N/A;  Coronary Artery Bypass Graft on pump times six;  utilizing internal mammary artery and right greater saphenous vein harvested endoscopically.x 5 vessels  . Insert / replace /  remove pacemaker  2013    /notes 11/05/2012-Medtronic  . Joint replacement Left     left TKA  . Esophagogastroduodenoscopy (egd) with propofol N/A 02/17/2013    Procedure: ESOPHAGOGASTRODUODENOSCOPY (EGD) WITH PROPOFOL;  Surgeon: Jerene Bears, MD;  Location: [facility];  Service: Gastroenterology;  Laterality: N/A;  . Balloon dilation N/A 02/17/2013    Procedure: BALLOON DILATION;  Surgeon: Jerene Bears, MD;  Location: [facility];  Service: Gastroenterology;  Laterality: N/A;  . Colonoscopy with propofol N/A 02/17/2013    Procedure: COLONOSCOPY WITH PROPOFOL;  Surgeon: Jerene Bears, MD;  Location: [facility];  Service: Gastroenterology;  Laterality: N/A;  . Permanent  pacemaker insertion N/A 06/20/2011    Procedure: PERMANENT PACEMAKER INSERTION;  Surgeon: Deboraha Sprang, MD;  Location: [facility] CATH LAB;  Service: Cardiovascular;  Laterality: N/A;  . Abdominal aortagram N/A 01/07/2012    Procedure: ABDOMINAL Maxcine Ham;  Surgeon: Angelia Mould, MD;  Location: [facility] CATH LAB;  Service: Cardiovascular;  Laterality: N/A;  . Lead revision N/A 11/05/2012    Procedure: LEAD REVISION;  Surgeon: Deboraha Sprang, MD;  Location: [facility] CATH LAB;  Service: Cardiovascular;  Laterality: N/A;  . Electrophysiology study N/A 11/05/2012    Procedure: ELECTROPHYSIOLOGY STUDY;  Surgeon: Deboraha Sprang, MD;  Location: [facility];  Service: Cardiovascular;  Laterality: N/A;    Social History   Social History  . Marital Status: Married    Spouse Name: N/A  . Number of Children: N/A  . Years of Education: N/A   Occupational History  . Not on file.   Social History Main Topics  . Smoking status: Former Smoker -- 3.00 packs/day for 32 years    Types: Cigarettes    Quit date: 01/16/1979  . Smokeless tobacco: Never Used  . Alcohol Use: 1.2 oz/week    2 Glasses of wine per week     Comment: beer 1-2 weekly (occasional)  . Drug Use: No  . Sexual Activity: Not Currently   Other Topics Concern  . Not on file   Social History Narrative    Family History  Problem Relation Age of Onset  . Coronary artery disease Father   . Hypertension Father   . Heart disease Father     Heart Disease before age 27  . Heart attack Mother   . Coronary artery disease Mother   . Deep vein thrombosis Mother   . Heart disease Mother     Heart Disease before age 76  . Hyperlipidemia Mother   . Hypertension Mother   . Hypertension Sister   . Heart disease Sister     Heart Disease before age 106  . Hyperlipidemia Sister   . Anesthesia problems Neg Hx   . Heart disease Brother     Heart Disease before age 19  . Diabetes Neg Hx   . Colon cancer Neg Hx     Current Outpatient  Prescriptions on File Prior to Visit  Medication Sig Dispense Refill  . acetaminophen (TYLENOL) 500 MG tablet Take 500 mg by mouth every 6 (six) hours as needed for mild pain or moderate pain.     Marland Kitchen albuterol (PROVENTIL HFA;VENTOLIN HFA) 108 (90 BASE) MCG/ACT inhaler Inhale 2 puffs into the lungs every 4 (four) hours as needed for wheezing or shortness of breath (cough, shortness of breath or wheezing.). 1 Inhaler 1  . apixaban (ELIQUIS) 5 MG TABS tablet Take 1 tablet (5 mg total) by mouth 2 (two) times daily. 180 tablet 3  .  benzonatate (TESSALON) 100 MG capsule TAKE 1 TO 2 CAPSULES BY MOUTH 3 TIMES DAILY AS NEEDED 30 capsule 0  . carvedilol (COREG) 25 MG tablet TAKE 1 TABLET (25 MG TOTAL) BY MOUTH 2 (TWO) TIMES DAILY. 60 tablet 6  . cefdinir (OMNICEF) 300 MG capsule Take 2 capsules (600 mg total) by mouth daily. 20 capsule 0  . Cholecalciferol (VITAMIN D3) 2000 UNITS TABS Take 2,000 Units by mouth daily.    . diphenhydramine-acetaminophen (TYLENOL PM) 25-500 MG TABS Take 1 tablet by mouth at bedtime as needed (sleep).     . famotidine (PEPCID) 20 MG tablet Take 20 mg by mouth daily.    . furosemide (LASIX) 40 MG tablet Take 2 tablets (80 mg total) by mouth daily. 30 tablet 3  . furosemide (LASIX) 40 MG tablet TAKE 2 TABLETS BY MOUTH EVERY DAY 180 tablet 0  . Garlic 7672 MG CAPS Take 2,000 mg by mouth daily.    Marland Kitchen glipiZIDE (GLUCOTROL) 10 MG tablet Take 10 mg by mouth daily before breakfast.    . HYDROcodone-homatropine (HYCODAN) 5-1.5 MG/5ML syrup Take 5 mLs by mouth every 4 (four) hours as needed. 120 mL 0  . Iron Combinations (IRON COMPLEX PO) Take 2 capsules by mouth daily.    Marland Kitchen KLOR-CON M20 20 MEQ tablet TAKE 1 TABLET BY MOUTH EVERY DAY 30 tablet 6  . L-Methylfolate-Algae-B12-B6 (METANX) 3-90.314-2-35 MG CAPS TAKE 1 CAPSULE BY MOUTH ONCE DAILY  (OV NEEDED) 60 capsule 0  . loratadine (CLARITIN) 10 MG tablet Take 10 mg by mouth daily.    Marland Kitchen losartan (COZAAR) 100 MG tablet TAKE 1 TABLET BY MOUTH  EVERY DAY 90 tablet 3  . Magnesium Oxide 420 MG TABS Take 420 mg by mouth daily.    . metFORMIN (GLUCOPHAGE) 1000 MG tablet Take 1,000 mg by mouth 2 (two) times daily with a meal.    . Multiple Vitamin (MULTIVITAMIN WITH MINERALS) TABS Take 1 tablet by mouth daily.    Marland Kitchen NOVOTWIST 32G X 5 MM MISC     . omeprazole (PRILOSEC) 20 MG capsule Take 20 mg by mouth daily.     Glory Rosebush DELICA LANCETS FINE MISC 1 each by other route daily. 1/day 250.62 100 each 3  . ONETOUCH VERIO test strip     . oxymetazoline (AFRIN) 0.05 % nasal spray Place 2 sprays into the nose daily as needed for congestion.    . pravastatin (PRAVACHOL) 80 MG tablet TAKE 1 TABLET BY MOUTH EVERY DAY 30 tablet 11  . triamcinolone cream (KENALOG) 0.1 % Apply 1 application topically as needed (rash).      No current facility-administered medications on file prior to visit.    Allergies  Allergen Reactions  . Lipitor [Atorvastatin Calcium] Other (See Comments)    Reaction unknown    REVIEW OF SYSTEMS: see HPI for pertinent positives and negatives.   Physical Examination Filed Vitals:   11/25/14 0853 11/25/14 0855  BP: 157/91 153/85  Pulse: 92   Height: 5' 10"  (1.778 m)   Weight: 238 lb (107.956 kg)   SpO2: 96%    Body mass index is 34.15 kg/(m^2).  PHYSICAL EXAMINATION: General: A&O x 3, WDWN, obese male.  Gait: normal  Eyes: PERRLA,  Pulmonary: CTAB, without wheezes , rales or rhonchi  Cardiac: regular Rythm , without detected murmur   Carotid Bruits  Left  Right    Negative  Negative   Aorta: is not palpable  Radial pulses: are 2+ and =   VASCULAR  EXAM:  Extremities without ischemic changes  without Gangrene; without open wounds.  Both feet are warm and well-perfused. He has trace pitting edema in right lower leg and 1+ in left LE, no erythema.   LE Pulses  LEFT  RIGHT   FEMORAL  2+ palpable  2+ palpable   POPLITEAL  not palpable  not palpable   PT 2+  palpable 2+ palpable  DP 2+ palpable 2+palpable   Abdomen: soft, NT, no palpable masses.  Skin: no rashes, no ulcers.  Musculoskeletal: no muscle wasting or atrophy.  Neurologic: A&O X 3; Appropriate Affect, MOTOR FUNCTION: moving all extremities equally, motor strength 5/5 throughout. Speech is fluent/normal. CN 2-12 intact.              Non-Invasive Vascular Imaging  BLE Venous Duplex (Date: 11/25/2014):  LOWER EXTREMITY VENOUS DUPLEX EVALUATION    INDICATION: Left lower extremity pain and swelling    PREVIOUS INTERVENTION(S): NA    DUPLEX EXAM:      Common Femoral Vein  Femoral Vein Popliteal Vein Posterior Tibial Vein  Peroneal Vein Great Saphenous Vein   Right Left Right Left Right Left Right Left Right Left Right Left  Spontaneous + +  +  +  +  +  NA  Phasic + +  +  +  +  +  NA  Compressible + +  +  +  +  +  NA  Augmentation + +  +  +  +  +  NA  Competent + +  +  +  +  +  NA     Legend:  + = Yes, -  = No; P = Partial, D = Decreased, NV = Not Visualized, NA = Not Examined    Thrombosis - -  -  -  -  -  NA                                                        Legend:  A = Acute, C = Chronic, O = Obstructive, P = Partially Obstructive     ADDITIONAL FINDINGS:     IMPRESSION: Left lower extremity deep venous system is patent and compressible from the groin to the ankle segment. Left great saphenous vein has been harvested for use as bypass graft. Left small saphenous veins is patent and compressible. Contralateral common femoral vein is patent and compressible.     RLE: no DVT   LLE: no DVT    Medical Decision Making  Edward [name] is a 74 y.o. male who presents with: mild bilateral LE chronic venous insufficiency, no DVT. Continue elevation of legs when not walking, continue graduated walking program. Return as already scheduled in February 2017 for carotid Duplex, ABI's, and left LE arterial Duplex.    NICKEL, Sharmon Leyden, RN, MSN,  FNP-C [facility] Office: [phone]  11/25/2014, 9:07 AM  Clinic MD

## 2014-11-30 ENCOUNTER — Other Ambulatory Visit: Payer: Self-pay | Admitting: Cardiovascular Disease

## 2014-12-13 ENCOUNTER — Other Ambulatory Visit: Payer: Self-pay | Admitting: Physician Assistant

## 2014-12-15 ENCOUNTER — Encounter: Payer: Self-pay | Admitting: Cardiovascular Disease

## 2015-01-31 ENCOUNTER — Other Ambulatory Visit: Payer: Self-pay | Admitting: Cardiovascular Disease

## 2015-02-15 ENCOUNTER — Ambulatory Visit (INDEPENDENT_AMBULATORY_CARE_PROVIDER_SITE_OTHER): Payer: Federal, State, Local not specified - PPO | Admitting: Physician Assistant

## 2015-02-15 ENCOUNTER — Ambulatory Visit (INDEPENDENT_AMBULATORY_CARE_PROVIDER_SITE_OTHER): Payer: Federal, State, Local not specified - PPO

## 2015-02-15 VITALS — BP 124/78 | HR 83 | Temp 99.6°F | Resp 17 | Ht 69.5 in | Wt 237.0 lb

## 2015-02-15 DIAGNOSIS — R05 Cough: Secondary | ICD-10-CM | POA: Diagnosis not present

## 2015-02-15 DIAGNOSIS — J189 Pneumonia, unspecified organism: Secondary | ICD-10-CM | POA: Diagnosis not present

## 2015-02-15 DIAGNOSIS — J181 Lobar pneumonia, unspecified organism: Secondary | ICD-10-CM

## 2015-02-15 DIAGNOSIS — R0602 Shortness of breath: Secondary | ICD-10-CM

## 2015-02-15 DIAGNOSIS — R059 Cough, unspecified: Secondary | ICD-10-CM

## 2015-02-15 LAB — POCT CBC
Granulocyte percent: 79.3 %G (ref 37–80)
HEMATOCRIT: 36.6 % — AB (ref 43.5–53.7)
Hemoglobin: 12.5 g/dL — AB (ref 14.1–18.1)
LYMPH, POC: 1.7 (ref 0.6–3.4)
MCH, POC: 25.4 pg — AB (ref 27–31.2)
MCHC: 34.2 g/dL (ref 31.8–35.4)
MCV: 74.2 fL — AB (ref 80–97)
MID (cbc): 1.5 — AB (ref 0–0.9)
MPV: 7.5 fL (ref 0–99.8)
PLATELET COUNT, POC: 211 10*3/uL (ref 142–424)
POC Granulocyte: 12.1 — AB (ref 2–6.9)
POC LYMPH %: 10.9 % (ref 10–50)
POC MID %: 9.8 %M (ref 0–12)
RBC: 4.93 M/uL (ref 4.69–6.13)
RDW, POC: 17.9 %
WBC: 15.3 10*3/uL — AB (ref 4.6–10.2)

## 2015-02-15 MED ORDER — GUAIFENESIN ER 1200 MG PO TB12: 1.0000 | ORAL_TABLET | Freq: Two times a day (BID) | ORAL | Status: AC

## 2015-02-15 MED ORDER — LEVOFLOXACIN 500 MG PO TABS: 500.0000 mg | ORAL_TABLET | Freq: Every day | ORAL | Status: DC

## 2015-02-15 NOTE — Progress Notes (Signed)
Edward [name]  MRN: [id number] DOB: [birth year]  Subjective:  Pt presents to clinic with continued cough and SOB since his oct visit.  He took the medications that he was given but the cough has not completely resolved and he is tired of it.  His cough is mostly dry from his mid chest - he has trouble sleeping flat but that has been going on a while though it has gotten worse over the last bit of time.  He has problems with heartburn and is on medications for it but he does not have burning in his chest or bitter taste in the am.  He is having no chest pain.  He has had no fevers or chills and no wheezing.  Patient Active Problem List   Diagnosis Date Noted  . PVD (peripheral vascular disease) ([place]) 08/26/2013  . Benign neoplasm of colon 02/17/2013  . 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 Anemia, iron deficiency 11/22/2012  . Dyspnea 11/21/2012  . Pacemaker lead malfunction 09/23/2012  . Aftercare following surgery of the circulatory system, [place] 09/10/2012  . Diastolic CHF ([place]) 96/78/9381  . Bilateral lower extremity edema 04/14/2012  . PVD (peripheral vascular disease) with claudication ([place]) 03/26/2012  . Peripheral vascular disease, unspecified ([place]) 02/13/2012  . Pacemaker-Medtronic 06/21/2011  . Accelerated junctional rhythm 06/21/2011  . Coronary artery disease 06/21/2011  . S/P Maze operation for atrial fibrillation 06/21/2011  . Sick sinus syndrome ([place]) 06/19/2011  . HTN (hypertension) 05/31/2011  . LBBB (left bundle branch block) 11/15/2010  . Hyperlipidemia 05/30/2010  . Atherosclerosis of renal artery ([place]) 05/19/2010  . Atrial fibrillation ([place]) 04/18/2010  . Dyspnea on exertion 04/18/2010  . Diabetes mellitus ([place]) 04/18/2010  . Atherosclerosis of native arteries of the extremities with intermittent claudication 04/18/2010  . Anemia 04/18/2010  . PAIN IN SOFT TISSUES OF LIMB 03/31/2010  . CAROTID ARTERY DISEASE 03/30/2010    Current Outpatient Prescriptions on File Prior to  Visit  Medication Sig Dispense Refill  . acetaminophen (TYLENOL) 500 MG tablet Take 500 mg by mouth every 6 (six) hours as needed for mild pain or moderate pain.     Marland Kitchen albuterol (PROVENTIL HFA;VENTOLIN HFA) 108 (90 BASE) MCG/ACT inhaler Inhale 2 puffs into the lungs every 4 (four) hours as needed for wheezing or shortness of breath (cough, shortness of breath or wheezing.). 1 Inhaler 1  . apixaban (ELIQUIS) 5 MG TABS tablet Take 1 tablet (5 mg total) by mouth 2 (two) times daily. 180 tablet 3  . carvedilol (COREG) 25 MG tablet TAKE 1 TABLET (25 MG TOTAL) BY MOUTH 2 (TWO) TIMES DAILY. 60 tablet 6  . Cholecalciferol (VITAMIN D3) 2000 UNITS TABS Take 2,000 Units by mouth daily.    . diphenhydramine-acetaminophen (TYLENOL PM) 25-500 MG TABS Take 1 tablet by mouth at bedtime as needed (sleep).     . famotidine (PEPCID) 20 MG tablet Take 20 mg by mouth daily.    . furosemide (LASIX) 40 MG tablet TAKE 2 TABLETS BY MOUTH EVERY DAY 180 tablet 0  . Garlic 0175 MG CAPS Take 2,000 mg by mouth daily.    Marland Kitchen glipiZIDE (GLUCOTROL) 10 MG tablet Take 10 mg by mouth daily before breakfast.    . Iron Combinations (IRON COMPLEX PO) Take 2 capsules by mouth daily.    Marland Kitchen KLOR-CON M20 20 MEQ tablet TAKE 1 TABLET BY MOUTH EVERY DAY 30 tablet 9  . L-Methylfolate-Algae-B12-B6 (METANX) 3-90.314-2-35 MG CAPS TAKE 1 CAPSULE BY MOUTH ONCE DAILY  "OFFICE VISIT NEEDED"  60 capsule 0  . loratadine (CLARITIN) 10 MG tablet Take 10 mg by mouth daily.    Marland Kitchen losartan (COZAAR) 100 MG tablet TAKE 1 TABLET BY MOUTH EVERY DAY 90 tablet 3  . Magnesium Oxide 420 MG TABS Take 420 mg by mouth daily.    . metFORMIN (GLUCOPHAGE) 1000 MG tablet Take 1,000 mg by mouth 2 (two) times daily with a meal.    . Multiple Vitamin (MULTIVITAMIN WITH MINERALS) TABS Take 1 tablet by mouth daily.    Marland Kitchen NOVOTWIST 32G X 5 MM MISC     . omeprazole (PRILOSEC) 20 MG capsule Take 20 mg by mouth daily.     Glory Rosebush DELICA LANCETS FINE MISC 1 each by other route  daily. 1/day 250.62 100 each 3  . ONETOUCH VERIO test strip     . pravastatin (PRAVACHOL) 80 MG tablet TAKE 1 TABLET BY MOUTH EVERY DAY 30 tablet 11  . triamcinolone cream (KENALOG) 0.1 % Apply 1 application topically as needed (rash).      No current facility-administered medications on file prior to visit.    Allergies  Allergen Reactions  . Lipitor [Atorvastatin Calcium] Other (See Comments)    Reaction unknown    Review of Systems  Constitutional: Negative for fever and chills.  HENT: Positive for congestion and postnasal drip.   Respiratory: Positive for cough and shortness of breath. Negative for wheezing.   Cardiovascular: Negative for chest pain and leg swelling.   Objective:  BP 124/78 mmHg  Pulse 83  Temp(Src) 99.6 F (37.6 C) (Oral)  Resp 17  Ht 5' 9.5" (1.765 m)  Wt 237 lb (107.502 kg)  BMI 34.51 kg/m2  SpO2 98%  Physical Exam  Constitutional: He is oriented to person, place, and time and well-developed, well-nourished, and in no distress.  HENT:  Head: Normocephalic and atraumatic.  Right Ear: External ear normal.  Left Ear: External ear normal.  Eyes: Conjunctivae are normal.  Neck: Normal range of motion.  Cardiovascular: Normal rate, regular rhythm and normal heart sounds.   No murmur heard. Pulmonary/Chest: Effort normal and breath sounds normal. He has no wheezes.  With deep breaths patient starts to have a dry sounding cough. He is able to talk in full sentences.  Neurological: He is alert and oriented to person, place, and time. Gait normal.  Skin: Skin is warm and dry.  Psychiatric: Mood, memory, affect and judgment normal.   Dg Chest 2 View  02/15/2015  CLINICAL DATA:  Shortness of breath and cough for 4 months. Chest pain. Coronary artery disease. EXAM: CHEST  2 VIEW COMPARISON:  04/06/2014 FINDINGS: Heart size remains at the upper limits of normal. Prior CABG again noted as well as transvenous pacemaker. New streaky opacity is seen in the  anterior left lower lobe, which may be due to atelectasis or pneumonia. No evidence pneumothorax or pleural effusion. IMPRESSION: Anterior left lower lobe opacity, suspicious for atelectasis or pneumonia. Followup PA and lateral chest X-ray is recommended in 3-4 weeks following trial of antibiotic therapy to ensure resolution and exclude underlying malignancy. Electronically Signed   By: Earle Gell M.D.   On: 02/15/2015 13:40    Assessment and Plan :  Cough - Plan: POCT CBC, DG Chest 2 View  SOB (shortness of breath)  Left lower lobe pneumonia - Plan: Guaifenesin (MUCINEX MAXIMUM STRENGTH) 1200 MG TB12, levofloxacin (LEVAQUIN) 500 MG tablet  Treat patient for PNA with abx and mucinex.  He declines cough medication as they have not  worked in the past for him. He will RTC in 3 weeks for a repeat CXR as long as he is improving - he will RTC sooner if he does not improve or his symptoms change.  He agrees with the plan.  Windell Hummingbird PA-C  [facility] 02/15/2015 3:55 PM

## 2015-02-17 ENCOUNTER — Telehealth: Payer: Self-pay

## 2015-02-17 MED ORDER — HYDROCOD POLST-CPM POLST ER 10-8 MG/5ML PO SUER: 5.0000 mL | Freq: Two times a day (BID) | ORAL | Status: DC | PRN

## 2015-02-17 NOTE — Telephone Encounter (Addendum)
Patient requesting cough medication for his pneumonia.  [facility] on [address]  [phone]

## 2015-02-17 NOTE — Telephone Encounter (Signed)
Patient called again regarding his request for cough medication

## 2015-02-17 NOTE — Telephone Encounter (Signed)
Patient presented to the office before he was contacted by phone.  I will locate his Rx for Tussionex.  He understood that he was to receive 10-days of antibiotic, but only picked up #7 Levaquin.  Will advise patient that the #7 is appropriate, and apologize for any misunderstanding.

## 2015-02-17 NOTE — Telephone Encounter (Signed)
I wrote him some Tussionex which he has to pick up.

## 2015-02-20 ENCOUNTER — Other Ambulatory Visit: Payer: Self-pay | Admitting: Physician Assistant

## 2015-02-22 ENCOUNTER — Ambulatory Visit: Payer: Federal, State, Local not specified - PPO | Admitting: Cardiovascular Disease

## 2015-02-23 ENCOUNTER — Ambulatory Visit (INDEPENDENT_AMBULATORY_CARE_PROVIDER_SITE_OTHER): Payer: Federal, State, Local not specified - PPO | Admitting: Cardiovascular Disease

## 2015-02-23 ENCOUNTER — Encounter: Payer: Self-pay | Admitting: Cardiovascular Disease

## 2015-02-23 VITALS — BP 118/84 | HR 92 | Ht 70.0 in | Wt 235.8 lb

## 2015-02-23 DIAGNOSIS — I2510 Atherosclerotic heart disease of native coronary artery without angina pectoris: Secondary | ICD-10-CM

## 2015-02-23 DIAGNOSIS — I480 Paroxysmal atrial fibrillation: Secondary | ICD-10-CM | POA: Diagnosis not present

## 2015-02-23 DIAGNOSIS — E785 Hyperlipidemia, unspecified: Secondary | ICD-10-CM | POA: Diagnosis not present

## 2015-02-23 DIAGNOSIS — R0609 Other forms of dyspnea: Secondary | ICD-10-CM | POA: Diagnosis not present

## 2015-02-23 NOTE — Patient Instructions (Signed)

## 2015-02-23 NOTE — Progress Notes (Signed)
Edward [name] Date of Birth  02-16-1940 [place]  [address]    [place]   [place], [place]  [address]    [place], [place]  [address] [phone]  Fax  [phone]  [phone]  Fax [phone]  Problem list:  1. Atrial fibrillation- - s/p MAZE procedure  -   2. Hypertension 3. Diabetes mellitus 4. Dyslipidemia 5. Peripheral vascular disease:-Status post right carotid endarterectomy and status post PTA by Dr. Sherren Mocha 6. History of GI bleed-currently not on antiplatelet agents or anticoagulants-,  he was hospitalized July 30, 2011 for GI bleed.  Was on Xarelto, ASA, plavix.  Xarelto and plavx were held. 7. Anemia 8. History of TIA 9. Pacer 10 CABG - MAZE - Jun 12, 2011  History of Present Illness:  Edward [name]  Is a 75 y.o. gentleman with a history as noted above. He had coronary artery bypass grafting in May of this year. He also had a Maze procedure.  April 23, 2012: Edward [name] presents today after seeing Dr. wall in the office several weeks ago. He had vascular surgery on his left leg. Since that time he's had significant edema. There is no significant edema on the right leg. He's tried elevating his leg but has had minimal success. His Lasix was increased.  He has been able to diurese about 5 pounds since that time.    He's had some shortness of breath climbing stairs. He's not any chest pains. He did not sleep well last night. This may explain his elevated blood pressure.    Jun 04, 2012: Edward [name] was seen  In April for leg edema.  He has some infection associated with his vascular surgery leg would.  He saw Dr Scot Dock and his wound was debrided.   Echo revealed LV function.    He has done better on the Lasix 80 mg a day.  We have tried Lasix 40 mg a day but his dyspnea is worse.  He had CABG in May 2013.    Nov. 19, 2014; Edward [name] is very short of breath today.  He had a pacer revision ( revised atrial lead) by Dr.  Caryl Comes in Oct.  He has not noticed any improvement after the pacer revision.  He had GI bleeding and was seen by Dr. Birdena Jubilee.  He has not seen a GI doctor.     His breathing is much worse over the past several months.    He stopped his apirin this week.   Feb. 12, 2015: He has had some GI  bleeding .  Hb dropped.   EGD and colonoscopy did not show anything specific.  He had a Lexiscan myoview this week which was low risk for ischemia. His left ventricular systolic function is normal with an EF of 53%.   His EKG from 02/16/2013 reveals atrial /  ventricular pacing.     10/08/2014:  Doing well. Stays busy.  Not much regular exercise.   Retired from the [employer] office  On Eliquis - no blood in stool.     No CP , no dyspnea.    Feb. 8, 2017:  Edward [name] is seen today.   Wife Myril has had 2 major heart surgeries.   Coden has pneumonia - just finished some Abx and mucinex.  Had a dry cough.  No angina .   Seems to be doing better.    Current Outpatient Prescriptions on File Prior to Visit  Medication Sig Dispense Refill  . acetaminophen (TYLENOL) 500 MG tablet Take 500 mg by mouth every 6 (six) hours as needed for mild pain or moderate pain.     Marland Kitchen albuterol (PROVENTIL HFA;VENTOLIN HFA) 108 (90 BASE) MCG/ACT inhaler Inhale 2 puffs into the lungs every 4 (four) hours as needed for wheezing or shortness of breath (cough, shortness of breath or wheezing.). 1 Inhaler 1  . apixaban (ELIQUIS) 5 MG TABS tablet Take 1 tablet (5 mg total) by mouth 2 (two) times daily. 180 tablet 3  . carvedilol (COREG) 25 MG tablet TAKE 1 TABLET (25 MG TOTAL) BY MOUTH 2 (TWO) TIMES DAILY. 60 tablet 6  . chlorpheniramine-HYDROcodone (TUSSIONEX PENNKINETIC ER) 10-8 MG/5ML SUER Take 5 mLs by mouth 2 (two) times daily as needed for cough. 70 mL 0  . Cholecalciferol (VITAMIN D3) 2000 UNITS TABS Take 2,000 Units by mouth daily.    . diphenhydramine-acetaminophen (TYLENOL PM) 25-500 MG TABS Take 1 tablet by mouth at bedtime as  needed (sleep).     . famotidine (PEPCID) 20 MG tablet Take 20 mg by mouth daily.    . furosemide (LASIX) 40 MG tablet TAKE 2 TABLETS BY MOUTH EVERY DAY 180 tablet 0  . Garlic 6294 MG CAPS Take 2,000 mg by mouth daily.    Marland Kitchen glipiZIDE (GLUCOTROL) 10 MG tablet Take 10 mg by mouth daily before breakfast.    . Iron Combinations (IRON COMPLEX PO) Take 2 capsules by mouth daily.    Marland Kitchen KLOR-CON M20 20 MEQ tablet TAKE 1 TABLET BY MOUTH EVERY DAY 30 tablet 9  . L-Methylfolate-Algae-B12-B6 (METANX) 3-90.314-2-35 MG CAPS TAKE 1 CAPSULE BY MOUTH ONCE DAILY  "OFFICE VISIT NEEDED" 60 capsule 0  . loratadine (CLARITIN) 10 MG tablet Take 10 mg by mouth daily.    Marland Kitchen losartan (COZAAR) 100 MG tablet TAKE 1 TABLET BY MOUTH EVERY DAY 90 tablet 3  . Magnesium Oxide 420 MG TABS Take 420 mg by mouth daily.    . metFORMIN (GLUCOPHAGE) 1000 MG tablet Take 1,000 mg by mouth 2 (two) times daily with a meal.    . Multiple Vitamin (MULTIVITAMIN WITH MINERALS) TABS Take 1 tablet by mouth daily.    Marland Kitchen NOVOTWIST 32G X 5 MM MISC     . omeprazole (PRILOSEC) 20 MG capsule Take 20 mg by mouth daily.     Glory Rosebush DELICA LANCETS FINE MISC 1 each by other route daily. 1/day 250.62 100 each 3  . ONETOUCH VERIO test strip     . pravastatin (PRAVACHOL) 80 MG tablet TAKE 1 TABLET BY MOUTH EVERY DAY 30 tablet 11  . triamcinolone cream (KENALOG) 0.1 % Apply 1 application topically as needed (rash).      No current facility-administered medications on file prior to visit.    Allergies  Allergen Reactions  . Lipitor [Atorvastatin Calcium] Other (See Comments)    Reaction unknown    Past Medical History  Diagnosis Date  . A-fib ([place])   . HTN (hypertension)   . Dyslipidemia   . PVD (peripheral vascular disease) ([place])     s/p Right CEA March, 2011,   . Claudication ([place])   . Hyperlipidemia   . GERD (gastroesophageal reflux disease)   . Diabetic neuropathy (HCC)     mostly feet/ legs  . Pacemaker   . Stroke [facility])     ?  Marland Kitchen  Anemia     ?  . Diastolic CHF ([place]) 07/21/5463  . Exertional shortness of breath   .  Type II diabetes mellitus ([place])   . Neuromuscular disorder (HCC)     neuropathy  . Coronary artery disease     Past Surgical History  Procedure Laterality Date  . Carotid endarterectomy Right 03/2009  . Cholecystectomy    . Tonsillectomy and adenoidectomy    . Knee arthroscopy Left     "I had 3" (11/05/2012)  . Maze  06/14/2011    Procedure: MAZE;  Surgeon: Gaye Pollack, MD;  Location: [facility];  Service: Open Heart Surgery;  Laterality: N/A;  Ligate left atrial appendage  . Circumcision    . Femoral-popliteal bypass graft Left 03/06/2012    Procedure: BYPASS GRAFT FEMORAL-POPLITEAL ARTERY;  Surgeon: Angelia Mould, MD;  Location: [facility] OR;  Service: Vascular;  Laterality: Left;  Left Femoral - Below Knee Popliteal Bypass Graft with Vein and Intraoperative Arteriogram.  . Total knee arthroplasty Left   . Lead revision  11/05/2012    pacemaker/notes 11/05/2012  . Coronary artery bypass graft  06/14/2011    Procedure: CORONARY ARTERY BYPASS GRAFTING (CABG);  Surgeon: Gaye Pollack, MD;  Location: [facility];  Service: Open Heart Surgery;  Laterality: N/A;  Coronary Artery Bypass Graft on pump times six;  utilizing internal mammary artery and right greater saphenous vein harvested endoscopically.x 5 vessels  . Insert / replace / remove pacemaker  2013    /notes 11/05/2012-Medtronic  . Joint replacement Left     left TKA  . Esophagogastroduodenoscopy (egd) with propofol N/A 02/17/2013    Procedure: ESOPHAGOGASTRODUODENOSCOPY (EGD) WITH PROPOFOL;  Surgeon: Jerene Bears, MD;  Location: [facility];  Service: Gastroenterology;  Laterality: N/A;  . Balloon dilation N/A 02/17/2013    Procedure: BALLOON DILATION;  Surgeon: Jerene Bears, MD;  Location: [facility];  Service: Gastroenterology;  Laterality: N/A;  . Colonoscopy with propofol N/A 02/17/2013    Procedure: COLONOSCOPY WITH PROPOFOL;  Surgeon: Jerene Bears,  MD;  Location: [facility];  Service: Gastroenterology;  Laterality: N/A;  . Permanent pacemaker insertion N/A 06/20/2011    Procedure: PERMANENT PACEMAKER INSERTION;  Surgeon: Deboraha Sprang, MD;  Location: [facility] CATH LAB;  Service: Cardiovascular;  Laterality: N/A;  . Abdominal aortagram N/A 01/07/2012    Procedure: ABDOMINAL Maxcine Ham;  Surgeon: Angelia Mould, MD;  Location: [facility] CATH LAB;  Service: Cardiovascular;  Laterality: N/A;  . Lead revision N/A 11/05/2012    Procedure: LEAD REVISION;  Surgeon: Deboraha Sprang, MD;  Location: [facility] CATH LAB;  Service: Cardiovascular;  Laterality: N/A;  . Electrophysiology study N/A 11/05/2012    Procedure: ELECTROPHYSIOLOGY STUDY;  Surgeon: Deboraha Sprang, MD;  Location: [facility] CATH LAB;  Service: Cardiovascular;  Laterality: N/A;    History  Smoking status  . Former Smoker -- 3.00 packs/day for 32 years  . Types: Cigarettes  . Quit date: 01/16/1979  Smokeless tobacco  . Never Used    History  Alcohol Use  . 1.2 oz/week  . 2 Glasses of wine per week    Comment: beer 1-2 weekly (occasional)    Family History  Problem Relation Age of Onset  . Coronary artery disease Father   . Hypertension Father   . Heart disease Father     Heart Disease before age 25  . Heart attack Mother   . Coronary artery disease Mother   . Deep vein thrombosis Mother   . Heart disease Mother     Heart Disease before age 85  . Hyperlipidemia Mother   . Hypertension Mother   .  Hypertension Sister   . Heart disease Sister     Heart Disease before age 52  . Hyperlipidemia Sister   . Anesthesia problems Neg Hx   . Heart disease Brother     Heart Disease before age 33  . Diabetes Neg Hx   . Colon cancer Neg Hx     Reviw of Systems:  Reviewed in the HPI.  All other systems are negative.  Physical Exam: BP 118/84 mmHg  Pulse 92  Ht 5' 10"  (1.778 m)  Wt 235 lb 12.8 oz (106.958 kg)  BMI 33.83 kg/m2  SpO2 93% The patient is alert and oriented x 3.  The mood  and affect are normal.   Skin: warm and dry.      HEENT:   Normocephalic/atraumatic. He has a right carotid endarterectomy scar. His carotids are 2+. No JVD. His neck is supple. The membranes are moist.  Lungs: Lungs are clear   Heart: Regular rate. No murmurs.  His sternotomy scar is healing well. His thoracostomy sites are healing well.  Abdomen: Good bowel sounds. , NT   Extremities:  No  edema  Neuro:  Neuro exam is nonfocal.    ECG: 10/08/2014: 80 paced at 95 beats a minute. No changes.  Assessment / Plan:   1. Atrial fibrillation- - s/p MAZE procedure  -  - has   been followed by Dr. Caryl Comes. Continue Eliquis. 2. Hypertension-   blood pressure is well-controlled. Continue current medications. 3. Diabetes mellitus 4. Dyslipidemia- we'll check fasting lipids at his next visit.  5. Peripheral vascular disease:-Status post right carotid endarterectomy and status post PTA by Dr. Sherren Mocha 6. History of GI bleed- doing well on Eliquis 7. Anemia 8. History of TIA 9. Pacer 10 CABG - MAZE - Jun 12, 2011 -   recent episodes of angina. Continue current medications.    Desteni Piscopo, Wonda Cheng, MD  02/23/2015 8:47 AM    [place],  [place], [place]  [address] Pager [phone] Phone: [phone]; Fax: [phone]   [facility]  [address] [place], [place]  [address] [phone]   Fax [phone]

## 2015-03-01 ENCOUNTER — Encounter: Payer: Self-pay | Admitting: Internal Medicine

## 2015-03-01 ENCOUNTER — Ambulatory Visit (INDEPENDENT_AMBULATORY_CARE_PROVIDER_SITE_OTHER): Payer: Federal, State, Local not specified - PPO | Admitting: Internal Medicine

## 2015-03-01 ENCOUNTER — Encounter: Payer: Self-pay | Admitting: Family

## 2015-03-01 VITALS — BP 130/94 | HR 82 | Ht 70.0 in | Wt 228.2 lb

## 2015-03-01 DIAGNOSIS — R05 Cough: Secondary | ICD-10-CM | POA: Diagnosis not present

## 2015-03-01 DIAGNOSIS — R059 Cough, unspecified: Secondary | ICD-10-CM

## 2015-03-01 DIAGNOSIS — Z950 Presence of cardiac pacemaker: Secondary | ICD-10-CM

## 2015-03-01 DIAGNOSIS — I480 Paroxysmal atrial fibrillation: Secondary | ICD-10-CM | POA: Diagnosis not present

## 2015-03-01 DIAGNOSIS — I495 Sick sinus syndrome: Secondary | ICD-10-CM

## 2015-03-01 LAB — CUP PACEART INCLINIC DEVICE CHECK
Battery Impedance: 309 Ohm
Battery Remaining Longevity: 91 mo
Battery Voltage: 2.78 V
Brady Statistic AP VP Percent: 92 %
Brady Statistic AP VS Percent: 8 %
Brady Statistic AS VP Percent: 0 %
Implantable Lead Implant Date: 20130605
Implantable Lead Location: 753860
Implantable Lead Model: 1948
Implantable Lead Model: 1948
Lead Channel Impedance Value: 447 Ohm
Lead Channel Impedance Value: 701 Ohm
Lead Channel Pacing Threshold Amplitude: 0.5 V
Lead Channel Pacing Threshold Amplitude: 0.75 V
Lead Channel Pacing Threshold Pulse Width: 0.4 ms
Lead Channel Sensing Intrinsic Amplitude: 11.2 mV
Lead Channel Setting Pacing Pulse Width: 0.4 ms
MDC IDC LEAD IMPLANT DT: 20141022
MDC IDC LEAD LOCATION: 753859
MDC IDC MSMT LEADCHNL RV PACING THRESHOLD AMPLITUDE: 0.75 V
MDC IDC MSMT LEADCHNL RV PACING THRESHOLD PULSEWIDTH: 0.4 ms
MDC IDC MSMT LEADCHNL RV PACING THRESHOLD PULSEWIDTH: 0.4 ms
MDC IDC SESS DTM: 20170214165826
MDC IDC SET LEADCHNL RA PACING AMPLITUDE: 2 V
MDC IDC SET LEADCHNL RV PACING AMPLITUDE: 2.5 V
MDC IDC SET LEADCHNL RV SENSING SENSITIVITY: 2.8 mV
MDC IDC STAT BRADY AS VS PERCENT: 0 %

## 2015-03-01 NOTE — Patient Instructions (Addendum)
Medication Instructions: No Change  Labwork: None  Procedures/Testing: None  Follow-Up: - You have been referred to Pulmonary (persistent cough)- Dr. Chase Caller [place]va/ [facility]ssman  Your physician wants you to follow-up in: 6 months with the [place] year with Dr. Caryl Comes. You will receive a reminder letter in the mail two months in advance. If you don't receive a letter, please call our office to schedule the follow-up appointment.   Any Additional Special Instructions Will Be Listed Below (If Applicable).     If you need a refill on your cardiac medications before your next appointment, please call your pharmacy.

## 2015-03-01 NOTE — Progress Notes (Signed)
Patient Care Team: Darlyne Russian, MD as PCP - General (Family Medicine) Angelia Mould, MD as Attending Physician (Vascular Surgery) [name], MD as Attending Physician (Cardiology) Gaye Pollack, MD as Attending Physician (Cardiothoracic Surgery) Deboraha Sprang, MD as Attending Physician (Cardiology)   HPI  Edward [name] is a 75 y.o. male Seen in followup for pacemaker implanted for sinus node dysfunction and continue rapid junctional rhythm. This emerged post bypass surgery with concomitant maze.  Complaints of dyspnea on exertion were improved with  Diuretics along with  Edema.   We undertook pacing for atrial lead revision. It turned out that there is significantly compartmentalized atrial activity and we finally were able to find a site we can capture in case the atrium; there was no significant interval improvement.   He continues to feel well without complaints of chest pain. There is no edema. He does have a persistently noxious cough is now 3+. It is not significantly productive  he had an abnormal chest x-ray 1/17 with an anterior left lower lobe opacity concerning for pneumonia. Follow-up imaging was recommended  Past Medical History  Diagnosis Date  . A-fib ([place])   . HTN (hypertension)   . Dyslipidemia   . PVD (peripheral vascular disease) ([place])     s/p Right CEA March, 2011,   . Claudication ([place])   . Hyperlipidemia   . GERD (gastroesophageal reflux disease)   . Diabetic neuropathy (HCC)     mostly feet/ legs  . Pacemaker   . Stroke [facility])     ?  Marland Kitchen Anemia     ?  . Diastolic CHF ([place]) 8/0/0349  . Exertional shortness of breath   . Type II diabetes mellitus ([place])   . Neuromuscular disorder (HCC)     neuropathy  . Coronary artery disease     Past Surgical History  Procedure Laterality Date  . Carotid endarterectomy Right 03/2009  . Cholecystectomy    . Tonsillectomy and adenoidectomy    . Knee arthroscopy Left     "I had 3"  (11/05/2012)  . Maze  06/14/2011    Procedure: MAZE;  Surgeon: Gaye Pollack, MD;  Location: [facility];  Service: Open Heart Surgery;  Laterality: N/A;  Ligate left atrial appendage  . Circumcision    . Femoral-popliteal bypass graft Left 03/06/2012    Procedure: BYPASS GRAFT FEMORAL-POPLITEAL ARTERY;  Surgeon: Angelia Mould, MD;  Location: [facility] OR;  Service: Vascular;  Laterality: Left;  Left Femoral - Below Knee Popliteal Bypass Graft with Vein and Intraoperative Arteriogram.  . Total knee arthroplasty Left   . Lead revision  11/05/2012    pacemaker/notes 11/05/2012  . Coronary artery bypass graft  06/14/2011    Procedure: CORONARY ARTERY BYPASS GRAFTING (CABG);  Surgeon: Gaye Pollack, MD;  Location: [facility];  Service: Open Heart Surgery;  Laterality: N/A;  Coronary Artery Bypass Graft on pump times six;  utilizing internal mammary artery and right greater saphenous vein harvested endoscopically.x 5 vessels  . Insert / replace / remove pacemaker  2013    /notes 11/05/2012-[employer]tr[employer]  . Joint replacement Left     left TKA  . Esophagogastroduodenoscopy (egd) with propofol N/A 02/17/2013    Procedure: ESOPHAGOGASTRODUODENOSCOPY (EGD) WITH PROPOFOL;  Surgeon: Jerene Bears, MD;  [facility];  Service: Gastroenterology;  Laterality: N/A;  . Balloon dilation N/A 02/17/2013    Procedure: BALLOON DILATION;  Surgeon: Jerene Bears, MD;  Location: [facility];  Service: Gastroenterology;  Laterality: N/A;  . Colonoscopy with propofol N/A 02/17/2013    Procedure: COLONOSCOPY WITH PROPOFOL;  Surgeon: Jerene Bears, MD;  Location: [facility];  Service: Gastroenterology;  Laterality: N/A;  . Permanent pacemaker insertion N/A 06/20/2011    Procedure: PERMANENT PACEMAKER INSERTION;  Surgeon: Deboraha Sprang, MD;  Location: [facility] CATH LAB;  Service: Cardiovascular;  Laterality: N/A;  . Abdominal aortagram N/A 01/07/2012    Procedure: ABDOMINAL Maxcine Ham;  Surgeon: Angelia Mould, MD;  Location: [facility] CATH  LAB;  Service: Cardiovascular;  Laterality: N/A;  . Lead revision N/A 11/05/2012    Procedure: LEAD REVISION;  Surgeon: Deboraha Sprang, MD;  Location: [facility] CATH LAB;  Service: Cardiovascular;  Laterality: N/A;  . Electrophysiology study N/A 11/05/2012    Procedure: ELECTROPHYSIOLOGY STUDY;  Surgeon: Deboraha Sprang, MD;  Location: [facility] CATH LAB;  Service: Cardiovascular;  Laterality: N/A;    Current Outpatient Prescriptions  Medication Sig Dispense Refill  . acetaminophen (TYLENOL) 500 MG tablet Take 500 mg by mouth every 6 (six) hours as needed for mild pain or moderate pain.     Marland Kitchen albuterol (PROVENTIL HFA;VENTOLIN HFA) 108 (90 BASE) MCG/ACT inhaler Inhale 2 puffs into the lungs every 4 (four) hours as needed for wheezing or shortness of breath (cough, shortness of breath or wheezing.). 1 Inhaler 1  . apixaban (ELIQUIS) 5 MG TABS tablet Take 1 tablet (5 mg total) by mouth 2 (two) times daily. 180 tablet 3  . carvedilol (COREG) 25 MG tablet TAKE 1 TABLET (25 MG TOTAL) BY MOUTH 2 (TWO) TIMES DAILY. 60 tablet 6  . chlorpheniramine-HYDROcodone (TUSSIONEX PENNKINETIC ER) 10-8 MG/5ML SUER Take 5 mLs by mouth 2 (two) times daily as needed for cough. 70 mL 0  . Cholecalciferol (VITAMIN D3) 2000 UNITS TABS Take 2,000 Units by mouth daily.    . diphenhydramine-acetaminophen (TYLENOL PM) 25-500 MG TABS Take 1 tablet by mouth at bedtime as needed (sleep).     . famotidine (PEPCID) 20 MG tablet Take 20 mg by mouth daily.    . furosemide (LASIX) 40 MG tablet TAKE 2 TABLETS BY MOUTH EVERY DAY 180 tablet 0  . Garlic 8115 MG CAPS Take 2,000 mg by mouth daily.    Marland Kitchen glipiZIDE (GLUCOTROL) 5 MG tablet Take by mouth 2 (two) times daily before a meal.    . Iron Combinations (IRON COMPLEX PO) Take 2 capsules by mouth daily.    Marland Kitchen KLOR-CON M20 20 MEQ tablet TAKE 1 TABLET BY MOUTH EVERY DAY 30 tablet 9  . L-Methylfolate-Algae-B12-B6 (METANX) 3-90.314-2-35 MG CAPS Take 1 capsule by mouth daily. 30 capsule 0  . loratadine  (CLARITIN) 10 MG tablet Take 10 mg by mouth daily.    Marland Kitchen losartan (COZAAR) 100 MG tablet TAKE 1 TABLET BY MOUTH EVERY DAY 90 tablet 3  . Magnesium Oxide 420 MG TABS Take 420 mg by mouth daily.    . metFORMIN (GLUCOPHAGE) 1000 MG tablet Take 1,000 mg by mouth 2 (two) times daily with a meal.    . Multiple Vitamin (MULTIVITAMIN WITH MINERALS) TABS Take 1 tablet by mouth daily.    Marland Kitchen NOVOTWIST 32G X 5 MM MISC     . omeprazole (PRILOSEC) 20 MG capsule Take 20 mg by mouth daily.     Glory Rosebush DELICA LANCETS FINE MISC 1 each by other route daily. 1/day 250.62 100 each 3  . ONETOUCH VERIO test strip     . pravastatin (PRAVACHOL) 80 MG tablet TAKE 1 TABLET BY  MOUTH EVERY DAY 30 tablet 11  . triamcinolone cream (KENALOG) 0.1 % Apply 1 application topically as needed (rash).      No current facility-administered medications for this visit.    Allergies  Allergen Reactions  . Lipitor [Atorvastatin Calcium] Other (See Comments)    Reaction unknown    Review of Systems negative except from HPI and PMH  Physical Exam BP 130/94 mmHg  Pulse 82  Ht 5' 10"  (1.778 m)  Wt 228 lb 3.2 oz (103.511 kg)  BMI 32.74 kg/m2 Well developed and well nourished in no acute distress HENT normal E scleral and icterus clear Neck Supple JVP flat; carotids brisk and full Clear to ausculation Device pocket well healed; without hematoma or erythema.  There is no tethering Regular rate and rhythm, no murmurs gallops or rub Soft with active bowel sounds No clubbing cyanosis  Edema Alert and oriented, grossly normal motor and sensory function Skin Warm and Dry  ECG demonstrates AV pacing with potentially very small low-voltage T-wave  Assessment and  Plan Sinus node dysfunction  Junctional rhythm  Pacemaker-Medtronic  Atrial fibrillation  Cough x-ray suggestive of pneumonia 1/17  Ischemic heart disease with prior bypass  No ischemia  Functional status is stable  We will refer him to pulmonary for  his persistent cough  On anticoagulation without bleeding issues

## 2015-03-03 ENCOUNTER — Encounter: Payer: Self-pay | Admitting: Family

## 2015-03-03 ENCOUNTER — Ambulatory Visit (INDEPENDENT_AMBULATORY_CARE_PROVIDER_SITE_OTHER)
Admission: RE | Admit: 2015-03-03 | Discharge: 2015-03-03 | Disposition: A | Discharge status: Home / Self Care | Payer: Federal, State, Local not specified - PPO | Source: Ambulatory Visit | Attending: Family | Admitting: Family

## 2015-03-03 ENCOUNTER — Other Ambulatory Visit: Payer: Self-pay | Admitting: Family

## 2015-03-03 ENCOUNTER — Ambulatory Visit (HOSPITAL_COMMUNITY)
Admission: RE | Admit: 2015-03-03 | Discharge: 2015-03-03 | Disposition: A | Discharge status: Home / Self Care | Payer: Federal, State, Local not specified - PPO | Source: Ambulatory Visit | Attending: Family | Admitting: Family

## 2015-03-03 ENCOUNTER — Ambulatory Visit (INDEPENDENT_AMBULATORY_CARE_PROVIDER_SITE_OTHER): Payer: Federal, State, Local not specified - PPO | Admitting: Family

## 2015-03-03 VITALS — BP 162/99 | HR 91 | Temp 96.8°F | Resp 18 | Ht 70.0 in | Wt 229.0 lb

## 2015-03-03 DIAGNOSIS — E1151 Type 2 diabetes mellitus with diabetic peripheral angiopathy without gangrene: Secondary | ICD-10-CM

## 2015-03-03 DIAGNOSIS — Z9889 Other specified postprocedural states: Secondary | ICD-10-CM

## 2015-03-03 DIAGNOSIS — E1140 Type 2 diabetes mellitus with diabetic neuropathy, unspecified: Secondary | ICD-10-CM | POA: Insufficient documentation

## 2015-03-03 DIAGNOSIS — Z4889 Encounter for other specified surgical aftercare: Secondary | ICD-10-CM

## 2015-03-03 DIAGNOSIS — I110 Hypertensive heart disease with heart failure: Secondary | ICD-10-CM | POA: Diagnosis not present

## 2015-03-03 DIAGNOSIS — Z48812 Encounter for surgical aftercare following surgery on the circulatory system: Secondary | ICD-10-CM

## 2015-03-03 DIAGNOSIS — I6523 Occlusion and stenosis of bilateral carotid arteries: Secondary | ICD-10-CM

## 2015-03-03 DIAGNOSIS — Z95828 Presence of other vascular implants and grafts: Secondary | ICD-10-CM

## 2015-03-03 DIAGNOSIS — I5030 Unspecified diastolic (congestive) heart failure: Secondary | ICD-10-CM | POA: Insufficient documentation

## 2015-03-03 DIAGNOSIS — I6522 Occlusion and stenosis of left carotid artery: Secondary | ICD-10-CM | POA: Diagnosis not present

## 2015-03-03 DIAGNOSIS — Z87891 Personal history of nicotine dependence: Secondary | ICD-10-CM

## 2015-03-03 DIAGNOSIS — E785 Hyperlipidemia, unspecified: Secondary | ICD-10-CM | POA: Diagnosis not present

## 2015-03-03 DIAGNOSIS — I779 Disorder of arteries and arterioles, unspecified: Secondary | ICD-10-CM | POA: Diagnosis not present

## 2015-03-03 NOTE — Progress Notes (Signed)
[facility] [place] HISTORY AND PHYSICAL   MRN : [id number]  History of Present Illness:   TORE CARREKER is a 75 y.o. male patient of Dr. Scot Dock who is s/p left femoropopliteal bypass graft with vein on 03/06/2012.  He is also s/p right CEA on 03/22/09.   He denies claudication symptoms, denies non-healing wounds.  He had slurred speech for about a day, in 2012, no further TIA or stroke symptoms.  He states that his systolic blood pressure is usually 140-150's. 130/95 blood pressure recently was in his cardiologist office, Dr. Acie Fredrickson.  He states his recent stress test was normal.  Pt Diabetic: Yes, Review of records: his A1C in January 2017 was 6.4%, in control  Pt smoker: former smoker, quit in 1981   Pt meds include:  Statin :Yes  Betablocker: Yes  ASA: No, stopped when Eliquis started in October 2015  Other anticoagulants/antiplatelets: Eliquis for atrial fibrillation, (review of records) started after his October 2015 visit with his cardiologist, Dr. Caryl Comes. 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    Current Outpatient Prescriptions  Medication Sig Dispense Refill  . acetaminophen (TYLENOL) 500 MG tablet Take 500 mg by mouth every 6 (six) hours as needed for mild pain or moderate pain.     Marland Kitchen albuterol (PROVENTIL HFA;VENTOLIN HFA) 108 (90 BASE) MCG/ACT inhaler Inhale 2 puffs into the lungs every 4 (four) hours as needed for wheezing or shortness of breath (cough, shortness of breath or wheezing.). 1 Inhaler 1  . apixaban (ELIQUIS) 5 MG TABS tablet Take 1 tablet (5 mg total) by mouth 2 (two) times daily. 180 tablet 3  . carvedilol (COREG) 25 MG tablet TAKE 1 TABLET (25 MG TOTAL) BY MOUTH 2 (TWO) TIMES DAILY. 60 tablet 6  . chlorpheniramine-HYDROcodone (TUSSIONEX PENNKINETIC ER) 10-8 MG/5ML SUER Take 5 mLs by mouth 2 (two) times daily as needed for cough. 70 mL 0  . Cholecalciferol (VITAMIN D3) 2000 UNITS TABS Take 2,000 Units by  mouth daily.    . diphenhydramine-acetaminophen (TYLENOL PM) 25-500 MG TABS Take 1 tablet by mouth at bedtime as needed (sleep).     . famotidine (PEPCID) 20 MG tablet Take 20 mg by mouth daily.    . furosemide (LASIX) 40 MG tablet TAKE 2 TABLETS BY MOUTH EVERY DAY 180 tablet 0  . Garlic 2536 MG CAPS Take 2,000 mg by mouth daily.    Marland Kitchen glipiZIDE (GLUCOTROL) 5 MG tablet Take 5 mg by mouth 3 (three) times daily.     . Iron Combinations (IRON COMPLEX PO) Take 2 capsules by mouth daily.    Marland Kitchen KLOR-CON M20 20 MEQ tablet TAKE 1 TABLET BY MOUTH EVERY DAY 30 tablet 9  . L-Methylfolate-Algae-B12-B6 (METANX) 3-90.314-2-35 MG CAPS Take 1 capsule by mouth daily. 30 capsule 0  . loratadine (CLARITIN) 10 MG tablet Take 10 mg by mouth daily.    Marland Kitchen losartan (COZAAR) 100 MG tablet TAKE 1 TABLET BY MOUTH EVERY DAY 90 tablet 3  . Magnesium Oxide 420 MG TABS Take 420 mg by mouth daily.    . metFORMIN (GLUCOPHAGE) 1000 MG tablet Take 1,000 mg by mouth 2 (two) times daily with a meal.    . Multiple Vitamin (MULTIVITAMIN WITH MINERALS) TABS Take 1 tablet by mouth daily.    Marland Kitchen NOVOTWIST 32G X 5 MM MISC     . omeprazole (PRILOSEC) 20 MG capsule Take 20 mg by mouth daily.     Glory Rosebush DELICA LANCETS  FINE MISC 1 each by other route daily. 1/day 250.62 100 each 3  . ONETOUCH VERIO test strip     . pravastatin (PRAVACHOL) 80 MG tablet TAKE 1 TABLET BY MOUTH EVERY DAY 30 tablet 11  . triamcinolone cream (KENALOG) 0.1 % Apply 1 application topically as needed (rash).      No current facility-administered medications for this visit.    Past Medical History  Diagnosis Date  . A-fib ([place])   . HTN (hypertension)   . Dyslipidemia   . PVD (peripheral vascular disease) ([place])     s/p Right CEA March, 2011,   . Claudication ([place])   . Hyperlipidemia   . GERD (gastroesophageal reflux disease)   . Diabetic neuropathy (H[facility])     mostly feet/ legs  . Pacemaker   . Stroke [facility])     ?  Marland Kitchen Anemia     ?  . Diastolic CHF ([place])  09/20/6211  . Exertional shortness of breath   . Type II diabetes mellitus ([place])   . Neuromuscular disorder (HCC)     neuropathy  . Coronary artery disease   . Carotid artery occlusion     Social History Social History  Substance Use Topics  . Smoking status: Former Smoker -- 3.00 packs/day for 32 years    Types: Cigarettes    Quit date: 01/16/1979  . Smokeless tobacco: Never Used  . Alcohol Use: 1.2 oz/week    2 Glasses of wine per week     Comment: beer 1-2 weekly (occasional)    Family History Family History  Problem Relation Age of Onset  . Coronary artery disease Father   . Hypertension Father   . Heart disease Father     Heart Disease before age 60  . Heart attack Mother   . Coronary artery disease Mother   . Deep vein thrombosis Mother   . Heart disease Mother     Heart Disease before age 78  . Hyperlipidemia Mother   . Hypertension Mother   . Hypertension Sister   . Heart disease Sister     Heart Disease before age 85  . Hyperlipidemia Sister   . Anesthesia problems Neg Hx   . Heart disease Brother     Heart Disease before age 77  . Diabetes Neg Hx   . Colon cancer Neg Hx     Surgical History Past Surgical History  Procedure Laterality Date  . Carotid endarterectomy Right 03/2009  . Cholecystectomy    . Tonsillectomy and adenoidectomy    . Knee arthroscopy Left     "I had 3" (11/05/2012)  . Maze  06/14/2011    Procedure: MAZE;  Surgeon: Gaye Pollack, MD;  Location: [facility];  Service: Open Heart Surgery;  Laterality: N/A;  [place] left atrial appendage  . Circumcision    . Femoral-popliteal bypass graft Left 03/06/2012    Procedure: BYPASS GRAFT FEMORAL-POPLITEAL ARTERY;  Surgeon: Angelia Mould, MD;  Location: [facility] OR;  Service: Vascular;  Laterality: Left;  Left Femoral - Below Knee Popliteal Bypass Graft with Vein and Intraoperative Arteriogram.  . Total knee arthroplasty Left   . Lead revision  11/05/2012    pacemaker/notes 11/05/2012  .  Coronary artery bypass graft  06/14/2011    Procedure: CORONARY ARTERY BYPASS GRAFTING (CABG);  Surgeon: Gaye Pollack, MD;  Location: [facility];  Service: Open Heart Surgery;  Laterality: N/A;  Coronary Artery Bypass Graft on pump times six;  utilizing internal mammary artery and right greater  saphenous vein harvested endoscopically.x 5 vessels  . Insert / replace / remove pacemaker  2013    /notes 11/05/2012-[employer]  . Joint replacement Left     left TKA  . Esophagogastroduodenoscopy (egd) with propofol N/A 02/17/2013    Procedure: ESOPHAGOGASTRODUODENOSCOPY (EGD) WITH PROPOFOL;  Surgeon: Jerene Bears, MD;  Location: [facility];  Service: Gastroenterology;  Laterality: N/A;  . Balloon dilation N/A 02/17/2013    Procedure: BALLOON DILATION;  Surgeon: Jerene Bears, MD;  Location: [facility];  Service: Gastroenterology;  Laterality: N/A;  . Colonoscopy with propofol N/A 02/17/2013    Procedure: COLONOSCOPY WITH PROPOFOL;  Surgeon: Jerene Bears, MD;  Location: [facility];  Service: Gastroenterology;  Laterality: N/A;  . Permanent pacemaker insertion N/A 06/20/2011    Procedure: PERMANENT PACEMAKER INSERTION;  Surgeon: Deboraha Sprang, MD;  Location: [facility] CATH LAB;  Service: Cardiovascular;  Laterality: N/A;  . Abdominal aortagram N/A 01/07/2012    Procedure: ABDOMINAL Maxcine Ham;  Surgeon: Angelia Mould, MD;  Location: [facility] CATH LAB;  Service: Cardiovascular;  Laterality: N/A;  . Lead revision N/A 11/05/2012    Procedure: LEAD REVISION;  Surgeon: Deboraha Sprang, MD;  Location: [facility] CATH LAB;  Service: Cardiovascular;  Laterality: N/A;  . Electrophysiology study N/A 11/05/2012    Procedure: ELECTROPHYSIOLOGY STUDY;  Surgeon: Deboraha Sprang, MD;  Location: [facility] CATH LAB;  Service: Cardiovascular;  Laterality: N/A;    Allergies  Allergen Reactions  . Lipitor [Atorvastatin Calcium] Other (See Comments)    Reaction unknown    Current Outpatient Prescriptions  Medication Sig Dispense Refill  .  acetaminophen (TYLENOL) 500 MG tablet Take 500 mg by mouth every 6 (six) hours as needed for mild pain or moderate pain.     Marland Kitchen albuterol (PROVENTIL HFA;VENTOLIN HFA) 108 (90 BASE) MCG/ACT inhaler Inhale 2 puffs into the lungs every 4 (four) hours as needed for wheezing or shortness of breath (cough, shortness of breath or wheezing.). 1 Inhaler 1  . apixaban (ELIQUIS) 5 MG TABS tablet Take 1 tablet (5 mg total) by mouth 2 (two) times daily. 180 tablet 3  . carvedilol (COREG) 25 MG tablet TAKE 1 TABLET (25 MG TOTAL) BY MOUTH 2 (TWO) TIMES DAILY. 60 tablet 6  . chlorpheniramine-HYDROcodone (TUSSIONEX PENNKINETIC ER) 10-8 MG/5ML SUER Take 5 mLs by mouth 2 (two) times daily as needed for cough. 70 mL 0  . Cholecalciferol (VITAMIN D3) 2000 UNITS TABS Take 2,000 Units by mouth daily.    . diphenhydramine-acetaminophen (TYLENOL PM) 25-500 MG TABS Take 1 tablet by mouth at bedtime as needed (sleep).     . famotidine (PEPCID) 20 MG tablet Take 20 mg by mouth daily.    . furosemide (LASIX) 40 MG tablet TAKE 2 TABLETS BY MOUTH EVERY DAY 180 tablet 0  . Garlic 7048 MG CAPS Take 2,000 mg by mouth daily.    Marland Kitchen glipiZIDE (GLUCOTROL) 5 MG tablet Take 5 mg by mouth 3 (three) times daily.     . Iron Combinations (IRON COMPLEX PO) Take 2 capsules by mouth daily.    Marland Kitchen KLOR-CON M20 20 MEQ tablet TAKE 1 TABLET BY MOUTH EVERY DAY 30 tablet 9  . L-Methylfolate-Algae-B12-B6 (METANX) 3-90.314-2-35 MG CAPS Take 1 capsule by mouth daily. 30 capsule 0  . loratadine (CLARITIN) 10 MG tablet Take 10 mg by mouth daily.    Marland Kitchen losartan (COZAAR) 100 MG tablet TAKE 1 TABLET BY MOUTH EVERY DAY 90 tablet 3  . Magnesium Oxide 420 MG TABS Take 420 mg by  mouth daily.    . metFORMIN (GLUCOPHAGE) 1000 MG tablet Take 1,000 mg by mouth 2 (two) times daily with a meal.    . Multiple Vitamin (MULTIVITAMIN WITH MINERALS) TABS Take 1 tablet by mouth daily.    Marland Kitchen NOVOTWIST 32G X 5 MM MISC     . omeprazole (PRILOSEC) 20 MG capsule Take 20 mg by mouth  daily.     Glory Rosebush DELICA LANCETS FINE MISC 1 each by other route daily. 1/day 250.62 100 each 3  . ONETOUCH VERIO test strip     . pravastatin (PRAVACHOL) 80 MG tablet TAKE 1 TABLET BY MOUTH EVERY DAY 30 tablet 11  . triamcinolone cream (KENALOG) 0.1 % Apply 1 application topically as needed (rash).      No current facility-administered medications for this visit.     REVIEW OF SYSTEMS: See HPI for pertinent positives and negatives.  Physical Examination Filed Vitals:   03/03/15 1441 03/03/15 1443 03/03/15 1446  BP: 157/97 170/99 162/99  Pulse: 91 91 91  Temp: 96.8 F (36 C)    Resp: 18    Height: 5' 10"  (1.778 m)    Weight: 229 lb (103.874 kg)    SpO2: 96%     Body mass index is 32.86 kg/(m^2).  General: A&O x 3, WDWN, obese male.  Gait: normal  Eyes: PERRLA  Pulmonary: CTAB, non labored respirations Cardiac: regular rhythm, no detected murmur   Carotid Bruits  Left  Right    Negative  Negative   Aorta: is not palpable  Radial pulses: are 2+ and =   VASCULAR EXAM:  Extremities without ischemic changes  without Gangrene; without open wounds.  Both feet are warm and well-perfused. He has trace pitting edema in right lower leg and 1+ in left LE, no erythema.   LE Pulses  LEFT  RIGHT   FEMORAL  2+ palpable  2+ palpable   POPLITEAL  not palpable  not palpable   PT 2+ palpable 2+ palpable  DP 2+ palpable 2+palpable   Abdomen: soft, NT, no palpable masses.  Skin: no rashes, no ulcers.  Musculoskeletal: no muscle wasting or atrophy.  Neurologic: A&O X 3; Appropriate Affect, MOTOR FUNCTION: moving all extremities equally, motor strength 5/5 throughout. Speech is fluent/normal. CN 2-12 intact                     Non-Invasive Vascular Imaging (03/03/2015):  CEREBROVASCULAR DUPLEX EVALUATION    Follow-up carotid disease     PREVIOUS INTERVENTION(S): Right carotid  endarterectomy 03/22/2009        RIGHT  LEFT  End Diastolic Velocities (cm/s) Plaque LOCATION Peak Systolic Velocities (cm/s) End Diastolic Velocities (cm/s) Plaque  12  CCA PROXIMAL 75 16   16  CCA MID 111 22   10  CCA DISTAL 92 19 HT  16  ECA 84 9   18  ICA PROXIMAL 91 25 HT  29  ICA MID 76 25   23  ICA DISTAL 70 21     NA ICA / CCA Ratio (PSV) 0.99  Occluded  Vertebral Flow Antegrade   481 Brachial Systolic Pressure (mmHg) 856  Within normal limits  Subclavian Artery Waveforms Within normal limits     Plaque Morphology:  HM = Homogeneous, HT = Heterogeneous, CP = Calcific Plaque, SP = Smooth Plaque, IP = Irregular Plaque     ADDITIONAL FINDINGS:     1. Widely patent right carotid endarterectomy without evidence of restenosis or hyperplasia.  2. Evidence of <40% left internal carotid artery stenosis. 3. Right vertebral artery appears functionally occluded. 4. Left vertebral artery is antegrade.    Compared to the previous exam:  No significant change compared to prior exam of 2015     LOWER EXTREMITY ARTERIAL DUPLEX EVALUATION    INDICATION: Follow-up left lower extremity bypass graft     PREVIOUS INTERVENTION(S): Left femoropopliteal arterial bypass graft placed 03/06/2012    DUPLEX EXAM:     RIGHT  LEFT   Peak Systolic Velocity (cm/s) Ratio (if abnormal) Waveform  Peak Systolic Velocity (cm/s) Ratio (if abnormal) Waveform     Inflow Artery 146  T     Proximal Anastomosis 99  T     Proximal Graft 102  T     Mid Graft 152  T      Distal Graft 100  T     Distal Anastomosis 153  T     Outflow Artery 175  T  1.22 Today's ABI / TBI 1.24  1.13 Previous ABI / TBI (03/03/2014 ) 1.15    Waveform:    M - Monophasic       B - Biphasic       T - Triphasic  If Ankle Brachial Index (ABI) or Toe Brachial Index (TBI) performed, please see complete report     ADDITIONAL FINDINGS:     IMPRESSION: Widely patent left femoropopliteal arterial bypass graft without evidence of  restenosis or hyperplasia.     Compared to the previous exam:  No significant change compared to prior exam.       ASSESSMENT:  ARBEN PACKMAN is a 75 y.o. male who is s/p left femoropopliteal bypass graft with vein on 03/06/2012.  He is also s/p right CEA on 03/22/09.   He had a TIA in 2012, none subsequently, no hx of stroke. He has no claudication, no signs of ischemia in his feet/legs.  Today's carotid duplex suggests a wdely patent right carotid endarterectomy without evidence of restenosis or hyperplasia.  Evidence of <40% left internal carotid artery stenosis. Right vertebral artery appears functionally occluded. Left vertebral artery is antegrade. No significant change compared to prior exam of 2015.   Today's left LE arterial duplex suggests a widely patent left femoropopliteal arterial bypass graft without evidence of restenosis or hyperplasia. No significant change compared to prior exam.  ABI's remain normal with all triphasic waveforms.    PLAN:   Based on today's exam and non-invasive vascular lab results, the patient will follow up in 1 year with the following tests: ABI's and left LE arterial duplex. I discussed in depth with the patient the nature of atherosclerosis, and emphasized the importance of maximal medical management including strict control of blood pressure, blood glucose, and lipid levels, obtaining regular exercise, and cessation of smoking.  The patient is aware that without maximal medical management the underlying atherosclerotic disease process will progress, limiting the benefit of any interventions.  The patient was given information about stroke prevention and what symptoms should prompt the patient to seek immediate medical care.  The patient was given information about PAD including signs, symptoms, treatment, what symptoms should prompt the patient to seek immediate medical care, and risk reduction measures to take. Thank you for allowing [place]  to participate in this patient's care.  Clemon Chambers, RN, MSN, FNP-C [facility] Office: [phone]  Clinic MD: Oneida Alar 03/03/2015 3:01 PM

## 2015-03-03 NOTE — Patient Instructions (Signed)
Stroke Prevention Some medical conditions and behaviors are associated with an increased chance of having a stroke. You may prevent a stroke by making healthy choices and managing medical conditions. HOW CAN I REDUCE MY RISK OF HAVING A STROKE?   Stay physically active. Get at least 30 minutes of activity on most or all days.  Do not smoke. It may also be helpful to avoid exposure to secondhand smoke.  Limit alcohol use. Moderate alcohol use is considered to be:  No more than 2 drinks per day for men.  No more than 1 drink per day for nonpregnant women.  Eat healthy foods. This involves:  Eating 5 or more servings of fruits and vegetables a day.  Making dietary changes that address high blood pressure (hypertension), high cholesterol, diabetes, or obesity.  Manage your cholesterol levels.  Making food choices that are high in fiber and low in saturated fat, trans fat, and cholesterol may control cholesterol levels.  Take any prescribed medicines to control cholesterol as directed by your health care provider.  Manage your diabetes.  Controlling your carbohydrate and sugar intake is recommended to manage diabetes.  Take any prescribed medicines to control diabetes as directed by your health care provider.  Control your hypertension.  Making food choices that are low in salt (sodium), saturated fat, trans fat, and cholesterol is recommended to manage hypertension.  Ask your health care provider if you need treatment to lower your blood pressure. Take any prescribed medicines to control hypertension as directed by your health care provider.  If you are 18-39 years of age, have your blood pressure checked every 3-5 years. If you are 40 years of age or older, have your blood pressure checked every year.  Maintain a healthy weight.  Reducing calorie intake and making food choices that are low in sodium, saturated fat, trans fat, and cholesterol are recommended to manage  weight.  Stop drug abuse.  Avoid taking birth control pills.  Talk to your health care provider about the risks of taking birth control pills if you are over 35 years old, smoke, get migraines, or have ever had a blood clot.  Get evaluated for sleep disorders (sleep apnea).  Talk to your health care provider about getting a sleep evaluation if you snore a lot or have excessive sleepiness.  Take medicines only as directed by your health care provider.  For some people, aspirin or blood thinners (anticoagulants) are helpful in reducing the risk of forming abnormal blood clots that can lead to stroke. If you have the irregular heart rhythm of atrial fibrillation, you should be on a blood thinner unless there is a good reason you cannot take them.  Understand all your medicine instructions.  Make sure that other conditions (such as anemia or atherosclerosis) are addressed. SEEK IMMEDIATE MEDICAL CARE IF:   You have sudden weakness or numbness of the face, arm, or leg, especially on one side of the body.  Your face or eyelid droops to one side.  You have sudden confusion.  You have trouble speaking (aphasia) or understanding.  You have sudden trouble seeing in one or both eyes.  You have sudden trouble walking.  You have dizziness.  You have a loss of balance or coordination.  You have a sudden, severe headache with no known cause.  You have new chest pain or an irregular heartbeat. Any of these symptoms may represent a serious problem that is an emergency. Do not wait to see if the symptoms will   go away. Get medical help at once. Call your local emergency services (911 in [place] Do not drive yourself to the hospital.   This information is not intended to replace advice given to you by your health care provider. Make sure you discuss any questions you have with your health care provider.   Document Released: 02/09/2004 Document Revised: 01/22/2014 Document Reviewed:  07/04/2012 Elsevier Interactive Patient Education 2016 Elsevier Inc.    Peripheral Vascular Disease Peripheral vascular disease (PVD) is a disease of the blood vessels that are not part of your heart and brain. A simple term for PVD is poor circulation. In most cases, PVD narrows the blood vessels that carry blood from your heart to the rest of your body. This can result in a decreased supply of blood to your arms, legs, and internal organs, like your stomach or kidneys. However, it most often affects a person's lower legs and feet. There are two types of PVD.  Organic PVD. This is the more common type. It is caused by damage to the structure of blood vessels.  Functional PVD. This is caused by conditions that make blood vessels contract and tighten (spasm). Without treatment, PVD tends to get worse over time. PVD can also lead to acute ischemic limb. This is when an arm or limb suddenly has trouble getting enough blood. This is a medical emergency. CAUSES Each type of PVD has many different causes. The most common cause of PVD is buildup of a fatty material (plaque) inside of your arteries (atherosclerosis). Small amounts of plaque can break off from the walls of the blood vessels and become lodged in a smaller artery. This blocks blood flow and can cause acute ischemic limb. Other common causes of PVD include:  Blood clots that form inside of blood vessels.  Injuries to blood vessels.  Diseases that cause inflammation of blood vessels or cause blood vessel spasms.  Health behaviors and health history that increase your risk of developing PVD. RISK FACTORS  You may have a greater risk of PVD if you:  Have a family history of PVD.  Have certain medical conditions, including:  High cholesterol.  Diabetes.  High blood pressure (hypertension).  Coronary heart disease.  Past problems with blood clots.  Past injury, such as burns or a broken bone. These may have damaged blood  vessels in your limbs.  Buerger disease. This is caused by inflamed blood vessels in your hands and feet.  Some forms of arthritis.  Rare birth defects that affect the arteries in your legs.  Use tobacco.  Do not get enough exercise.  Are obese.  Are age 50 or older. SIGNS AND SYMPTOMS  PVD may cause many different symptoms. Your symptoms depend on what part of your body is not getting enough blood. Some common signs and symptoms include:  Cramps in your lower legs. This may be a symptom of poor leg circulation (claudication).  Pain and weakness in your legs while you are physically active that goes away when you rest (intermittent claudication).  Leg pain when at rest.  Leg numbness, tingling, or weakness.  Coldness in a leg or foot, especially when compared with the other leg.  Skin or hair changes. These can include:  Hair loss.  Shiny skin.  Pale or bluish skin.  Thick toenails.  Inability to get or maintain an erection (erectile dysfunction). People with PVD are more prone to developing ulcers and sores on their toes, feet, or legs. These may take longer than   normal to heal. DIAGNOSIS Your health care provider may diagnose PVD from your signs and symptoms. The health care provider will also do a physical exam. You may have tests to find out what is causing your PVD and determine its severity. Tests may include:  Blood pressure recordings from your arms and legs and measurements of the strength of your pulses (pulse volume recordings).  Imaging studies using sound waves to take pictures of the blood flow through your blood vessels (Doppler ultrasound).  Injecting a dye into your blood vessels before having imaging studies using:  X-rays (angiogram or arteriogram).  Computer-generated X-rays (CT angiogram).  A powerful electromagnetic field and a computer (magnetic resonance angiogram or MRA). TREATMENT Treatment for PVD depends on the cause of your condition  and the severity of your symptoms. It also depends on your age. Underlying causes need to be treated and controlled. These include long-lasting (chronic) conditions, such as diabetes, high cholesterol, and high blood pressure. You may need to first try making lifestyle changes and taking medicines. Surgery may be needed if these do not work. Lifestyle changes may include:  Quitting smoking.  Exercising regularly.  Following a low-fat, low-cholesterol diet. Medicines may include:  Blood thinners to prevent blood clots.  Medicines to improve blood flow.  Medicines to improve your blood cholesterol levels. Surgical procedures may include:  A procedure that uses an inflated balloon to open a blocked artery and improve blood flow (angioplasty).  A procedure to put in a tube (stent) to keep a blocked artery open (stent implant).  Surgery to reroute blood flow around a blocked artery (peripheral bypass surgery).  Surgery to remove dead tissue from an infected wound on the affected limb.  Amputation. This is surgical removal of the affected limb. This may be necessary in cases of acute ischemic limb that are not improved through medical or surgical treatments. HOME CARE INSTRUCTIONS  Take medicines only as directed by your health care provider.  Do not use any tobacco products, including cigarettes, chewing tobacco, or electronic cigarettes. If you need help quitting, ask your health care provider.  Lose weight if you are overweight, and maintain a healthy weight as directed by your health care provider.  Eat a diet that is low in fat and cholesterol. If you need help, ask your health care provider.  Exercise regularly. Ask your health care provider to suggest some good activities for you.  Use compression stockings or other mechanical devices as directed by your health care provider.  Take good care of your feet.  Wear comfortable shoes that fit well.  Check your feet often for  any cuts or sores. SEEK MEDICAL CARE IF:  You have cramps in your legs while walking.  You have leg pain when you are at rest.  You have coldness in a leg or foot.  Your skin changes.  You have erectile dysfunction.  You have cuts or sores on your feet that are not healing. SEEK IMMEDIATE MEDICAL CARE IF:  Your arm or leg turns cold and blue.  Your arms or legs become red, warm, swollen, painful, or numb.  You have chest pain or trouble breathing.  You suddenly have weakness in your face, arm, or leg.  You become very confused or lose the ability to speak.  You suddenly have a very bad headache or lose your vision.   This information is not intended to replace advice given to you by your health care provider. Make sure you discuss any questions   you have with your health care provider.   Document Released: 02/09/2004 Document Revised: 01/22/2014 Document Reviewed: 06/11/2013 Elsevier Interactive Patient Education 2016 Elsevier Inc.  

## 2015-03-04 NOTE — Addendum Note (Signed)
Addended by: Dorthula Rue L on: 03/04/2015 10:03 AM   Modules accepted: Orders

## 2015-03-11 ENCOUNTER — Ambulatory Visit (INDEPENDENT_AMBULATORY_CARE_PROVIDER_SITE_OTHER)
Admission: RE | Admit: 2015-03-11 | Discharge: 2015-03-11 | Disposition: A | Discharge status: Home / Self Care | Payer: Federal, State, Local not specified - PPO | Source: Ambulatory Visit | Attending: Internal Medicine | Admitting: Internal Medicine

## 2015-03-11 ENCOUNTER — Encounter: Payer: Self-pay | Admitting: Internal Medicine

## 2015-03-11 ENCOUNTER — Ambulatory Visit (INDEPENDENT_AMBULATORY_CARE_PROVIDER_SITE_OTHER): Payer: Federal, State, Local not specified - PPO | Admitting: Internal Medicine

## 2015-03-11 VITALS — BP 144/88 | HR 85 | Ht 70.0 in | Wt 232.0 lb

## 2015-03-11 DIAGNOSIS — J9811 Atelectasis: Secondary | ICD-10-CM | POA: Diagnosis not present

## 2015-03-11 DIAGNOSIS — R05 Cough: Secondary | ICD-10-CM | POA: Diagnosis not present

## 2015-03-11 DIAGNOSIS — R058 Other specified cough: Secondary | ICD-10-CM | POA: Insufficient documentation

## 2015-03-11 MED ORDER — OMEPRAZOLE 40 MG PO CPDR: DELAYED_RELEASE_CAPSULE | ORAL | Status: DC

## 2015-03-11 MED ORDER — PREDNISONE 10 MG PO TABS: ORAL_TABLET | ORAL | Status: DC

## 2015-03-11 MED ORDER — TRAMADOL HCL 50 MG PO TABS: ORAL_TABLET | ORAL | Status: DC

## 2015-03-11 NOTE — Progress Notes (Signed)
Subjective:     Patient ID: Edward [name], male   DOB: 05/13/1940   MRN: [id number]   Brief patient profile:  74 yowm quit smoking 1981 @ wt around 210 with apparent  Onset of breathing difficult late spring  2013  "thought it was his heart"  but had cabg 06/14/11 at wt around 225  and no improvement to date in doe with no def cardiac source identified by Dr Caryl Comes  so self referred to pulmonary clinic 11/21/2012 .   History of Present Illness  11/21/2012 1st [facility] office visit/ Wert cc sob x exertion x steps x one flight at home does it s stopping but short of breath at top.  Does ok at [employer], does downhill to mailbox fine but uphill to house from  [place] has to go slow or has to stop, seems to be getting worse gradually over time. Never occurs at rest or supine/ sleeping.  rec Prilosec 20 mg take  X  2 x 30 min before breakfast and Pepcid 20 mg at bedtime consistently until return GERD diet W/u for Fe def> Dr Hilarie Fredrickson eval and rx (see emr)    06/24/2013 f/u ov/Wert re: doe/ resolved to pt's satisfaction on gerd rx/ no pulmonary meds Chief Complaint  Patient presents with  . Follow-up    Pt here to followup to discuss 6MW test. He denies any respiratory co's today.   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 Omeprazole should be Take 30-60 min before first meal of the day   09/10/2013 f/u ov/Wert re: doe resolved to his satisfaction/ cough resolved since taking omeprazole q am  Chief Complaint  Patient presents with  . Follow-up    had 6 min. walk, no sob,no cough   Not limited by breathing from desired activities / no need for inhaers   rec Your  only pulmonary problem is carrying extra weight below your diaphragm so keep working on weight loss   03/11/2015 consultation/ referred by DR Klein/Wert re: recurrent cough  Chief Complaint  Patient presents with  . Advice Only    Pt states "I have a catch in my chest".  He had Bronchitis Nov 2016 and has had cough since then. Cough is non prod. He is not sleeping well due to cough.   had been doing well on omeprazole 20 mg "with my morning meds" up until one week before 1st  visit to [facility] 11/02/14  with cough / sob ever since  Only thing that's helped is tussionex  Feels short of breath even if not coughing but able to go to mailbox and back uphill s change in breathing from prior to onset  Worst problem at hs assoc with nasal congestion but doesn't typically wake him in am    No obvious day to day or daytime variabilty or assoc excess/ purulent sputum or mucus plugs  or chest tightness, subjective wheeze overt sinus or hb symptoms. No unusual exp hx or h/o childhood pna/ asthma or knowledge of premature birth.  Sleeping ok without nocturnal  or early am exacerbation  of respiratory  c/o's or need for noct saba. Also  denies any obvious fluctuation of symptoms with weather or environmental changes or other aggravating or alleviating factors except as outlined above   Current Medications, Allergies, Complete Past Medical History, Past Surgical History, Family History, and Social History were reviewed in [employer] record.  ROS  The following are not active complaints unless bolded sore throat, dysphagia, dental problems, itching, sneezing,  nasal congestion or excess/ purulent secretions, ear ache,   fever, chills, sweats, unintended wt loss, pleuritic or exertional cp, hemoptysis,  orthopnea pnd or leg swelling, presyncope, palpitations, heartburn, abdominal pain, anorexia, nausea, vomiting, diarrhea  or change in bowel or urinary habits, change in stools or urine,  dysuria,hematuria,  rash, arthralgias, visual complaints, headache, numbness weakness or ataxia or problems with walking or coordination,  change in mood/affect or memory.                Objective:   Physical Exam   amb wm nad   06/24/13           242  >  03/11/2015  232     11/21/12 243 lb (110.224 kg)  11/06/12 249 lb 1.9 oz (113 kg)  11/06/12 249 lb 1.9 oz (113 kg)     HEENT: nl dentition, turbinates, and orophanx. Nl external ear canals without cough reflex   NECK :  without JVD/Nodes/TM/ nl carotid upstrokes bilaterally   LUNGS: no acc muscle use,     CV:  RRR  no s3 or murmur or increase in P2, no edema   ABD:  Quite obese but soft and nontender with nl excursion in the supine position. No bruits or organomegaly, bowel sounds nl  MS:  warm without deformities, calf tenderness, cyanosis or clubbing  SKIN: warm and dry without lesions        CXR PA and Lateral:   03/11/2015 :    I personally reviewed images and agree with radiology impression as follows:    Persistent left base atelectasis and infiltrate. Continued follow-up chest x-rays suggest to demonstrate clearing. Small left pleural effusion. My imprression:  ssa in either the lingula or ant basal segment LLL     Assessment:

## 2015-03-11 NOTE — Patient Instructions (Addendum)
Please see patient coordinator before you leave today  to schedule sinus CT   Please remember to go to the  x-ray department downstairs for your tests - we will call you with the results when they are available.  Omeprazole 40 mg Take 30- 60 min before your first and last meals of the day until the cough is better for a week then resume previous dose   GERD (REFLUX)  is an extremely common cause of respiratory symptoms just like yours , many times with no obvious heartburn at all.    It can be treated with medication, but also with lifestyle changes including elevation of the head of your bed (ideally with 6 inch  bed blocks),  Smoking cessation, avoidance of late meals, excessive alcohol, and avoid fatty foods, chocolate, peppermint, colas, red wine, and acidic juices such as orange juice.  NO MINT OR MENTHOL PRODUCTS SO NO COUGH DROPS  USE SUGARLESS CANDY INSTEAD (Jolley ranchers or Stover's or Life Savers) or even ice chips will also do - the key is to swallow to prevent all throat clearing. NO OIL BASED VITAMINS - use powdered substitutes.   Take delsym two tsp every 12 hours and supplement if needed with  tramadol 50 mg up to 2 every 4 hours to suppress the urge to cough. Swallowing water or using ice chips/non mint and menthol containing candies (such as lifesavers or sugarless jolly ranchers) are also effective.  You should rest your voice and avoid activities that you know make you cough.  Once you have eliminated the cough for 3 straight days try reducing the tramadol first,  then the delsym as tolerated.     If not all better return in 2 weeks

## 2015-03-11 NOTE — Progress Notes (Signed)
Quick Note:  Spoke with pt and notified of results per Dr. Wert. Pt verbalized understanding and denied any questions.  ______ 

## 2015-03-12 ENCOUNTER — Encounter: Payer: Self-pay | Admitting: Internal Medicine

## 2015-03-12 DIAGNOSIS — J9811 Atelectasis: Secondary | ICD-10-CM | POA: Insufficient documentation

## 2015-03-12 NOTE — Assessment & Plan Note (Signed)
Complicated by HBP/ Hyperlipidemia/ DM   Body mass index is 33.29    Lab Results  Component Value Date   TSH 3.68 12/03/2012     Contributing to gerd tendency/ doe/reviewed the need and the process to achieve and maintain neg calorie balance > defer f/u primary care including intermittently monitoring thyroid status

## 2015-03-12 NOTE — Assessment & Plan Note (Signed)
Since he has a smoking hx/ albeit remote, will need f/u and proceed with ct/fob if not cleared but most likely this is just mucus plug and has nothing to do with this cough - placed in tickle file for recall by 1st week in April 2017

## 2015-03-12 NOTE — Assessment & Plan Note (Signed)
The most common causes of chronic cough in immunocompetent adults include the following: upper airway cough syndrome (UACS), previously referred to as postnasal drip syndrome (PNDS), which is caused by variety of rhinosinus conditions; (2) asthma; (3) GERD; (4) chronic bronchitis from cigarette smoking or other inhaled environmental irritants; (5) nonasthmatic eosinophilic bronchitis; and (6) bronchiectasis.   These conditions, singly or in combination, have accounted for up to 94% of the causes of chronic cough in prospective studies.   Other conditions have constituted no >6% of the causes in prospective studies These have included bronchogenic carcinoma, chronic interstitial pneumonia, sarcoidosis, left ventricular failure, ACEI-induced cough, and aspiration from a condition associated with pharyngeal dysfunction.    Chronic cough is often simultaneously caused by more than one condition. A single cause has been found from 38 to 82% of the time, multiple causes from 18 to 62%. Multiply caused cough has been the result of three diseases up to 42% of the time.       Based on hx and exam, this is most likely:  Classic Upper airway cough syndrome, so named because it's frequently impossible to sort out how much is  CR/sinusitis with freq throat clearing (which can be related to primary GERD)   vs  causing  secondary (" extra esophageal")  GERD from wide swings in gastric pressure that occur with throat clearing, often  promoting self use of mint and menthol lozenges that reduce the lower esophageal sphincter tone and exacerbate the problem further in a cyclical fashion.   These are the same pts (now being labeled as having "irritable larynx syndrome" by some cough centers) who not infrequently have a history of having failed to tolerate ace inhibitors,  dry powder inhalers or biphosphonates or report having atypical reflux symptoms that don't respond to standard doses of PPI , and are easily confused as  having aecopd or asthma flares by even experienced allergists/ pulmonologists.   The first step is to maximize acid suppression and eliminate cyclical coughing then regroup if the cough persists.  I had an extended discussion with the patient reviewing all relevant studies completed to date and  lasting 35/60 min  1) Explained the natural history of uri and why it's necessary in patients at risk to treat GERD aggressively - at least  short term -   to reduce risk of evolving cyclical cough initially  triggered by epithelial injury and a heightened sensitivty to the effects of any upper airway irritants,  most importantly acid - related - then perpetuated by epithelial injury related to the cough itself as the upper airway collapses on itself.  That is, the more sensitive the epithelium becomes once it is damaged by the virus, the more the ensuing irritability> the more the cough, the more the secondary reflux (especially in those prone to reflux) the more the irritation of the sensitive mucosa and so on in a  Classic cyclical pattern.    2) Each maintenance medication was reviewed in detail including most importantly the difference between maintenance and as needed and under what circumstances the prns are to be used.  Please see instructions for details which were reviewed in writing and the patient given a copy.

## 2015-03-17 ENCOUNTER — Ambulatory Visit (INDEPENDENT_AMBULATORY_CARE_PROVIDER_SITE_OTHER)
Admission: RE | Admit: 2015-03-17 | Discharge: 2015-03-17 | Disposition: A | Discharge status: Home / Self Care | Payer: Federal, State, Local not specified - PPO | Source: Ambulatory Visit | Attending: Internal Medicine | Admitting: Internal Medicine

## 2015-03-17 DIAGNOSIS — R05 Cough: Secondary | ICD-10-CM

## 2015-03-17 DIAGNOSIS — R058 Other specified cough: Secondary | ICD-10-CM

## 2015-03-18 NOTE — Progress Notes (Signed)
Quick Note:  Spoke with pt and notified of results per Dr. Wert. Pt verbalized understanding and denied any questions.  ______ 

## 2015-03-20 ENCOUNTER — Other Ambulatory Visit: Payer: Self-pay | Admitting: Family Medicine

## 2015-03-23 ENCOUNTER — Other Ambulatory Visit: Payer: Self-pay

## 2015-03-23 MED ORDER — METANX 3-90.314-2-35 MG PO CAPS: 1.0000 | ORAL_CAPSULE | Freq: Every day | ORAL | Status: DC

## 2015-03-25 ENCOUNTER — Encounter: Payer: Self-pay | Admitting: Internal Medicine

## 2015-03-25 ENCOUNTER — Telehealth: Payer: Self-pay | Admitting: Internal Medicine

## 2015-03-25 NOTE — Telephone Encounter (Signed)
Fine with me

## 2015-03-25 NOTE — Telephone Encounter (Signed)
Patient would like to change providers.  Needed appointment for Pneumonia follow up, patient having a lot of coughing, congestion still and wanted to get in as soon as possible with any provider that is available.  Scheduled patient to see Dr. Vaughan Browner for acute visit to follow up on his pneumonia since he is still having acute symptoms.  Will change providers when needing follow up appointment.    Dr. Melvyn Novas, ok to change to another provider? Please advise.

## 2015-03-25 NOTE — Telephone Encounter (Signed)
Called and spoke to pt's wife. Informed her that MW ok'd the switch. Pt's wife requesting to wait till acute visit with PM on Monday to decide what provider they would like to change to. Nothing further needed at this time.

## 2015-03-28 ENCOUNTER — Ambulatory Visit (INDEPENDENT_AMBULATORY_CARE_PROVIDER_SITE_OTHER): Payer: Federal, State, Local not specified - PPO | Admitting: Pulmonary Disease

## 2015-03-28 ENCOUNTER — Encounter: Payer: Self-pay | Admitting: Pulmonary Disease

## 2015-03-28 ENCOUNTER — Ambulatory Visit (INDEPENDENT_AMBULATORY_CARE_PROVIDER_SITE_OTHER)
Admission: RE | Admit: 2015-03-28 | Discharge: 2015-03-28 | Disposition: A | Discharge status: Home / Self Care | Payer: Federal, State, Local not specified - PPO | Source: Ambulatory Visit | Attending: Pulmonary Disease | Admitting: Pulmonary Disease

## 2015-03-28 VITALS — BP 146/88 | HR 86 | Ht 70.0 in | Wt 232.0 lb

## 2015-03-28 DIAGNOSIS — R0609 Other forms of dyspnea: Secondary | ICD-10-CM | POA: Diagnosis not present

## 2015-03-28 DIAGNOSIS — J9811 Atelectasis: Secondary | ICD-10-CM

## 2015-03-28 DIAGNOSIS — R0600 Dyspnea, unspecified: Secondary | ICD-10-CM

## 2015-03-28 NOTE — Addendum Note (Signed)
Addended by: Parke Poisson E on: 03/28/2015 12:55 PM   Modules accepted: Orders

## 2015-03-28 NOTE — Patient Instructions (Signed)
We will schedule you for a CT of the chest without contrast. You will be given a sample of Spiriva inhaler. Continue using the Prilosec  Return to clinic in 1-2 months.

## 2015-03-28 NOTE — Progress Notes (Signed)
Subjective:    Patient ID: Edward [name], male    DOB: [birth year], 75 y.o.   MRN: [id number]  HPI Follow-up for evaluation of cough, dyspnea, wheezing.  Edward [name] is a 75 year old with past medical history as below. He is complaining of cough for the past 3-4 months. He had been treated with several courses of antibiotic and a prednisone taper. Symptoms associated with dyspnea, wheezing, chest congestion, 'fluid in the lungs'. Chest x-ray shows persistent left lower lobe infiltrate/atelectasis.  He was seen by Dr. Melvyn Novas earlier this year and prilosec increased to 40 bid. He reports cough is better but he still has dyspnea, wheezing, chest congestion.  DATA: PFTs 06/12/11 FVC 3.32 (74%) FEV1 2.50 (76%) F/F 75 Exline TLC 96% DLCO 69%  PFTs 07/21/12 FVC 3.09 (69%) FEV1 2.29 (67%) F/F 74  CXR 2/24 Persistent LLL opacity  Social History: 30 pack year smoking history. Quit in 1981. No alcohol or drug use.  Family History: Father-coronary artery disease, hypertension. Mother-coronary artery disease, DVT, hyperlipidemia, hypertension. Sisters-coronary artery disease, hyperlipidemia, hypertension.  Past Medical History  Diagnosis Date  . A-fib ([place])   . HTN (hypertension)   . Dyslipidemia   . PVD (peripheral vascular disease) ([place])     s/p Right CEA March, 2011,   . Claudication ([place])   . Hyperlipidemia   . GERD (gastroesophageal reflux disease)   . Diabetic neuropathy (HCC)     mostly feet/ legs  . Pacemaker   . Stroke [facility])     ?  Marland Kitchen Anemia     ?  . Diastolic CHF ([place]) 3/0/1601  . Exertional shortness of breath   . Type II diabetes mellitus ([place])   . Neuromuscular disorder (HCC)     neuropathy  . Coronary artery disease   . Carotid artery occlusion      Current outpatient prescriptions:  .  acetaminophen (TYLENOL) 500 MG tablet, Take 500 mg by mouth every 6 (six) hours as needed for mild pain or moderate pain. , Disp: , Rfl:  .  albuterol (PROVENTIL  HFA;VENTOLIN HFA) 108 (90 BASE) MCG/ACT inhaler, Inhale 2 puffs into the lungs every 4 (four) hours as needed for wheezing or shortness of breath (cough, shortness of breath or wheezing.)., Disp: 1 Inhaler, Rfl: 1 .  apixaban (ELIQUIS) 5 MG TABS tablet, Take 1 tablet (5 mg total) by mouth 2 (two) times daily., Disp: 180 tablet, Rfl: 3 .  carvedilol (COREG) 25 MG tablet, TAKE 1 TABLET (25 MG TOTAL) BY MOUTH 2 (TWO) TIMES DAILY., Disp: 60 tablet, Rfl: 6 .  diphenhydramine-acetaminophen (TYLENOL PM) 25-500 MG TABS, Take 1 tablet by mouth at bedtime as needed (sleep). , Disp: , Rfl:  .  famotidine (PEPCID) 20 MG tablet, Take 20 mg by mouth daily., Disp: , Rfl:  .  furosemide (LASIX) 40 MG tablet, TAKE 2 TABLETS BY MOUTH EVERY DAY, Disp: 180 tablet, Rfl: 0 .  glipiZIDE (GLUCOTROL) 5 MG tablet, Take 5 mg by mouth 3 (three) times daily. , Disp: , Rfl:  .  Iron Combinations (IRON COMPLEX PO), Take 2 capsules by mouth daily., Disp: , Rfl:  .  KLOR-CON M20 20 MEQ tablet, TAKE 1 TABLET BY MOUTH EVERY DAY, Disp: 30 tablet, Rfl: 9 .  L-Methylfolate-Algae-B12-B6 (METANX) 3-90.314-2-35 MG CAPS, Take 1 capsule by mouth daily., Disp: 30 capsule, Rfl: 0 .  loratadine (CLARITIN) 10 MG tablet, Take 10 mg by mouth daily., Disp: , Rfl:  .  losartan (COZAAR) 100 MG tablet, TAKE 1 TABLET  BY MOUTH EVERY DAY, Disp: 90 tablet, Rfl: 3 .  Magnesium Oxide 420 MG TABS, Take 420 mg by mouth daily., Disp: , Rfl:  .  metFORMIN (GLUCOPHAGE) 1000 MG tablet, Take 1,000 mg by mouth 2 (two) times daily with a meal., Disp: , Rfl:  .  Multiple Vitamin (MULTIVITAMIN WITH MINERALS) TABS, Take 1 tablet by mouth daily., Disp: , Rfl:  .  NOVOTWIST 32G X 5 MM MISC, , Disp: , Rfl:  .  omeprazole (PRILOSEC) 40 MG capsule, Take 30- 60 min before your first and last meals of the day, Disp: 60 capsule, Rfl: 1 .  ONETOUCH DELICA LANCETS FINE MISC, 1 each by other route daily. 1/day 250.62, Disp: 100 each, Rfl: 3 .  ONETOUCH VERIO test strip, , Disp:  , Rfl:  .  pravastatin (PRAVACHOL) 80 MG tablet, TAKE 1 TABLET BY MOUTH EVERY DAY, Disp: 30 tablet, Rfl: 11 .  traMADol (ULTRAM) 50 MG tablet, 1-2 every 4 hours as needed for cough or pain, Disp: 40 tablet, Rfl: 0 .  triamcinolone cream (KENALOG) 0.1 %, Apply 1 application topically as needed (rash). , Disp: , Rfl:   Review of Systems Cough sputum production. Has dyspnea, wheezing. No hemoptysis. No chest pain, palpitation. No nausea, vomiting, diarrhea, constipation. No fevers, chills, loss of weight, loss of appetite. All other review of systems are negative    Objective:   Physical Exam Blood pressure 146/88, pulse 86, height 5' 10"  (1.778 m), weight 232 lb (105.235 kg), SpO2 97 %. Gen: No apparent distress Neuro: No gross focal deficits. Neck: No JVD, lymphadenopathy, thyromegaly. RS: Clear, No wheeze or crackles CVS: S1-S2 heard, no murmurs rubs gallops. Abdomen: Soft, positive bowel sounds. Extremities: No edema.    Assessment & Plan:  Eval for cough, dyspnea, congestion.  Likely has upper airway syndrome from GERD. Symptoms have improved on high dose of Prilosec. He will continue the same. He has emphysema based on CT imaging in the past although his PFTs do not show overt obstruction by F/F criteria. He is currently just on albuterol rescue inhaler which does not help much. I'll try him on Spiriva.  He does have persistent left lower lobe opacity. He had a chest x-ray today and on my preliminary reading opacity has improved but is still seen. I'll get a CT chest without contrast to evaluate further.  Plan: - Start spiriva - CT chest w/o contrast.  Marshell Garfinkel MD [facility] Pager [phone] If no answer or after 3pm call: [phone] 03/28/2015, 12:47 PM

## 2015-04-01 ENCOUNTER — Ambulatory Visit (INDEPENDENT_AMBULATORY_CARE_PROVIDER_SITE_OTHER)
Admission: RE | Admit: 2015-04-01 | Discharge: 2015-04-01 | Disposition: A | Discharge status: Home / Self Care | Payer: Federal, State, Local not specified - PPO | Source: Ambulatory Visit | Attending: Pulmonary Disease | Admitting: Pulmonary Disease

## 2015-04-01 DIAGNOSIS — J9811 Atelectasis: Secondary | ICD-10-CM

## 2015-04-04 ENCOUNTER — Telehealth: Payer: Self-pay | Admitting: Pulmonary Disease

## 2015-04-04 NOTE — Telephone Encounter (Signed)
Patient notified of results. Nothing further needed.

## 2015-04-12 ENCOUNTER — Ambulatory Visit: Payer: Federal, State, Local not specified - PPO | Admitting: Internal Medicine

## 2015-04-18 ENCOUNTER — Other Ambulatory Visit: Payer: Self-pay | Admitting: Family Medicine

## 2015-05-03 ENCOUNTER — Other Ambulatory Visit: Payer: Self-pay | Admitting: Family Medicine

## 2015-05-04 ENCOUNTER — Ambulatory Visit: Payer: Federal, State, Local not specified - PPO | Admitting: Pulmonary Disease

## 2015-05-04 ENCOUNTER — Other Ambulatory Visit: Payer: Self-pay | Admitting: Internal Medicine

## 2015-05-16 ENCOUNTER — Telehealth: Payer: Self-pay | Admitting: Pulmonary Disease

## 2015-05-16 MED ORDER — TIOTROPIUM BROMIDE MONOHYDRATE 2.5 MCG/ACT IN AERS: 2.0000 | INHALATION_SPRAY | Freq: Every day | RESPIRATORY_TRACT | Status: DC

## 2015-05-16 NOTE — Telephone Encounter (Signed)
Pt needing refill on spiriva respimat to below verified pharmacy.  rx samples given at last office visit.  rx sent to pharmacy.  Nothing further needed.

## 2015-05-17 ENCOUNTER — Other Ambulatory Visit: Payer: Self-pay | Admitting: Family Medicine

## 2015-05-17 ENCOUNTER — Other Ambulatory Visit: Payer: Self-pay | Admitting: Cardiovascular Disease

## 2015-05-19 ENCOUNTER — Other Ambulatory Visit: Payer: Self-pay | Admitting: *Deleted

## 2015-05-19 MED ORDER — FUROSEMIDE 40 MG PO TABS: 80.0000 mg | ORAL_TABLET | Freq: Every day | ORAL | Status: DC

## 2015-05-19 NOTE — Telephone Encounter (Signed)
Patient is calling to follow up on refill request °

## 2015-05-20 NOTE — Telephone Encounter (Signed)
Can we refill or do you want patient to return to clinic for follow up

## 2015-05-22 NOTE — Telephone Encounter (Signed)
I think he needs to be seen since I only saw him for a cough last year.

## 2015-05-23 ENCOUNTER — Other Ambulatory Visit: Payer: Self-pay | Admitting: Family Medicine

## 2015-05-23 NOTE — Telephone Encounter (Signed)
Called and advised pt on VM the reason for denial of RF and need for check up.

## 2015-06-06 ENCOUNTER — Other Ambulatory Visit: Payer: Self-pay | Admitting: Family Medicine

## 2015-06-22 ENCOUNTER — Encounter: Payer: Self-pay | Admitting: Internal Medicine

## 2015-07-15 ENCOUNTER — Other Ambulatory Visit: Payer: Self-pay

## 2015-07-15 MED ORDER — POTASSIUM CHLORIDE CRYS ER 20 MEQ PO TBCR: 20.0000 meq | EXTENDED_RELEASE_TABLET | Freq: Every day | ORAL | Status: DC

## 2015-07-15 NOTE — Telephone Encounter (Signed)
[name], MD at 02/23/2015 8:47 AM  KLOR-CON M20 20 MEQ tabletTAKE 1 TABLET BY MOUTH EVERY DAY Patient Instructions     Medication Instructions:  Your physician recommends that you continue on your current medications as directed. Please refer to the Current Medication list given to you today.

## 2015-07-21 ENCOUNTER — Telehealth: Payer: Self-pay | Admitting: Cardiovascular Disease

## 2015-07-21 ENCOUNTER — Other Ambulatory Visit: Payer: Self-pay | Admitting: *Deleted

## 2015-07-21 MED ORDER — PRAVASTATIN SODIUM 80 MG PO TABS: 80.0000 mg | ORAL_TABLET | Freq: Every day | ORAL | Status: DC

## 2015-07-21 NOTE — Telephone Encounter (Signed)
New message       *STAT* If patient is at the pharmacy, call can be transferred to refill team.   1. Which medications need to be refilled? (please list name of each medication and dose if known) pravastatin 42m and klor con 223m 2. Which pharmacy/location (including street and city if local pharmacy) is medication to be sent to?[facility] [address] 3. Do they need a 30 day or 90 day supply? 90 day supply

## 2015-07-30 ENCOUNTER — Other Ambulatory Visit: Payer: Self-pay | Admitting: Internal Medicine

## 2015-08-02 ENCOUNTER — Telehealth: Payer: Self-pay | Admitting: Internal Medicine

## 2015-08-02 MED ORDER — OMEPRAZOLE 40 MG PO CPDR: DELAYED_RELEASE_CAPSULE | ORAL | Status: DC

## 2015-08-02 NOTE — Telephone Encounter (Signed)
Refilled omeprazole. Pts wife notified. Nothing further needed.

## 2015-08-18 ENCOUNTER — Ambulatory Visit (INDEPENDENT_AMBULATORY_CARE_PROVIDER_SITE_OTHER): Payer: Federal, State, Local not specified - PPO | Admitting: Family Medicine

## 2015-08-18 ENCOUNTER — Ambulatory Visit (INDEPENDENT_AMBULATORY_CARE_PROVIDER_SITE_OTHER): Payer: Federal, State, Local not specified - PPO

## 2015-08-18 VITALS — BP 140/80 | HR 91 | Temp 97.6°F | Resp 18 | Ht 70.0 in | Wt 235.0 lb

## 2015-08-18 DIAGNOSIS — Z23 Encounter for immunization: Secondary | ICD-10-CM

## 2015-08-18 DIAGNOSIS — M542 Cervicalgia: Secondary | ICD-10-CM

## 2015-08-18 MED ORDER — PREDNISONE 20 MG PO TABS: 40.0000 mg | ORAL_TABLET | Freq: Every day | ORAL | 0 refills | Status: AC

## 2015-08-18 MED ORDER — CYCLOBENZAPRINE HCL 10 MG PO TABS: 10.0000 mg | ORAL_TABLET | Freq: Three times a day (TID) | ORAL | 0 refills | Status: DC | PRN

## 2015-08-18 MED ORDER — PREDNISONE 20 MG PO TABS: 40.0000 mg | ORAL_TABLET | Freq: Every day | ORAL | 0 refills | Status: DC

## 2015-08-18 NOTE — Progress Notes (Signed)
Patient ID: Edward [name], male    DOB: 1940/12/10, 75 y.o.   MRN: [id number]  PCP: Molli Barrows, FNP  Chief Complaint  Patient presents with  . Neck Pain    2 DAYS    Subjective:  HPI Presents for evaluation of neck pain times two days. Neck pain has progressively worsened over the last two days. He is unaware of any direct injury of sudden neck maneuver that would have resulted in injury. He went horseback riding on Tuesday but feels it was smooth ride and didn't contribute to neck pain. He reports taking tylenol arthritis, hydrocodone, oxycodone without relief. Reports pain as  9/10.  He reports limited active and passive range of motion with flexion and bilateral rotation.  . Social History   Social History  . Marital status: Married    Spouse name: N/A  . Number of children: N/A  . Years of education: N/A   Occupational History  . Not on file.   Social History Main Topics  . Smoking status: Former Smoker    Packs/day: 3.00    Years: 32.00    Types: Cigarettes    Quit date: 01/16/1979  . Smokeless tobacco: Never Used  . Alcohol use 1.2 oz/week    2 Glasses of wine per week     Comment: beer 1-2 weekly (occasional)  . Drug use: No  . Sexual activity: Not Currently   Other Topics Concern  . Not on file   Social History Narrative  . No narrative on file    . Family History  Problem Relation Age of Onset  . Coronary artery disease Father   . Hypertension Father   . Heart disease Father     Heart Disease before age 50  . Heart attack Mother   . Coronary artery disease Mother   . Deep vein thrombosis Mother   . Heart disease Mother     Heart Disease before age 25  . Hyperlipidemia Mother   . Hypertension Mother   . Hypertension Sister   . Heart disease Sister     Heart Disease before age 10  . Hyperlipidemia Sister   . Heart disease Brother     Heart Disease before age 18  . Anesthesia problems Neg Hx   . Diabetes Neg Hx   . Colon cancer Neg  Hx    Review of Systems  Constitutional: Negative.   Respiratory: Negative.   Cardiovascular: Negative.   Musculoskeletal: Positive for neck pain and neck stiffness.       See HPI  Skin: Negative.        Patient Active Problem List   Diagnosis Date Noted  . Atelectasis/ subsegmental LLL ant basal segment  03/12/2015  . Morbid obesity ([place]) 03/12/2015  . Upper airway cough syndrome 03/11/2015  . PVD (peripheral vascular disease) ([place]) 08/26/2013  . Benign neoplasm of colon 02/17/2013  . 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 Anemia, iron deficiency 11/22/2012  . Dyspnea 11/21/2012  . Pacemaker lead malfunction 09/23/2012  . Aftercare following surgery of the circulatory system, [place] 09/10/2012  . Diastolic CHF ([place]) 20/25/4270  . Bilateral lower extremity edema 04/14/2012  . PVD (peripheral vascular disease) with claudication ([place]) 03/26/2012  . Peripheral vascular disease, unspecified ([place]) 02/13/2012  . Pacemaker-Medtronic 06/21/2011  . Accelerated junctional rhythm 06/21/2011  . Coronary artery disease 06/21/2011  . S/P Maze operation for atrial fibrillation 06/21/2011  . Sick sinus syndrome ([place]) 06/19/2011  . HTN (hypertension) 05/31/2011  . LBBB (left  bundle branch block) 11/15/2010  . Hyperlipidemia 05/30/2010  . Atherosclerosis of renal artery ([place]) 05/19/2010  . Atrial fibrillation ([place]) 04/18/2010  . Dyspnea on exertion 04/18/2010  . Diabetes mellitus ([place]) 04/18/2010  . Atherosclerosis of native arteries of the extremities with intermittent claudication 04/18/2010  . Anemia 04/18/2010  . PAIN IN SOFT TISSUES OF LIMB 03/31/2010  . CAROTID ARTERY DISEASE 03/30/2010     Prior to Admission medications   Medication Sig Start Date End Date Taking? Authorizing Provider  acetaminophen (TYLENOL) 500 MG tablet Take 500 mg by mouth every 6 (six) hours as needed for mild pain or moderate pain.    Yes Historical Provider, MD  apixaban (ELIQUIS) 5 MG TABS tablet Take 1 tablet (5 mg  total) by mouth 2 (two) times daily. 10/08/14  Yes [name], MD  carvedilol (COREG) 25 MG tablet TAKE 1 TABLET (25 MG TOTAL) BY MOUTH 2 (TWO) TIMES DAILY. 05/18/15  Yes [name], MD  diphenhydramine-acetaminophen (TYLENOL PM) 25-500 MG TABS Take 1 tablet by mouth at bedtime as needed (sleep).    Yes Historical Provider, MD  famotidine (PEPCID) 20 MG tablet Take 20 mg by mouth daily.   Yes Historical Provider, MD  furosemide (LASIX) 40 MG tablet Take 2 tablets (80 mg total) by mouth daily. 05/19/15  Yes [name], MD  glipiZIDE (GLUCOTROL) 5 MG tablet Take 5 mg by mouth 3 (three) times daily.    Yes Historical Provider, MD  Iron Combinations (IRON COMPLEX PO) Take 2 capsules by mouth daily.   Yes Historical Provider, MD  L-Methylfolate-Algae-B12-B6 [facility]) 3-90.314-2-35 MG CAPS TAKE ONE CAPSULE BY MOUTH EVERY DAY 05/03/15  Yes Posey Boyer, MD  loratadine (CLARITIN) 10 MG tablet Take 10 mg by mouth daily.   Yes Historical Provider, MD  losartan (COZAAR) 100 MG tablet TAKE 1 TABLET BY MOUTH EVERY DAY 11/08/14  Yes [name], MD  Magnesium Oxide 420 MG TABS Take 420 mg by mouth daily.   Yes Historical Provider, MD  metFORMIN (GLUCOPHAGE) 1000 MG tablet Take 1,000 mg by mouth 2 (two) times daily with a meal.   Yes Historical Provider, MD  Multiple Vitamin (MULTIVITAMIN WITH MINERALS) TABS Take 1 tablet by mouth daily.   Yes Historical Provider, MD  NOVOTWIST 32G X 5 MM MISC  06/12/13  Yes Historical Provider, MD  omeprazole (PRILOSEC) 40 MG capsule TAKE 1 CAPSULE 30 TO 60 MINUTES BEFORE YOUR FIRST AND LAST MEALS OF THE DAY 08/02/15  Yes Tanda Rockers, MD  [facility] DELICA LANCETS FINE MISC 1 each by other route daily. 1/day 250.62 06/10/13  Yes Renato Shin, MD  ONETOUCH VERIO test strip  06/10/13  Yes Historical Provider, MD  potassium chloride SA (KLOR-CON M20) 20 MEQ tablet Take 1 tablet (20 mEq total) by mouth daily. 07/15/15  Yes [name], MD  pravastatin (PRAVACHOL) 80 MG  tablet Take 1 tablet (80 mg total) by mouth daily. 07/21/15  Yes [name], MD  triamcinolone cream (KENALOG) 0.1 % Apply 1 application topically as needed (rash).  09/23/14  Yes Historical Provider, MD  albuterol (PROVENTIL HFA;VENTOLIN HFA) 108 (90 BASE) MCG/ACT inhaler Inhale 2 puffs into the lungs every 4 (four) hours as needed for wheezing or shortness of breath (cough, shortness of breath or wheezing.). Patient not taking: Reported on 08/18/2015 11/02/14   Posey Boyer, MD  Tiotropium Bromide Monohydrate (SPIRIVA RESPIMAT) 2.5 MCG/ACT AERS Inhale 2 puffs into the lungs daily. Patient not taking: Reported  on 08/18/2015 05/16/15   Marshell Garfinkel, MD  traMADol (ULTRAM) 50 MG tablet 1-2 every 4 hours as needed for cough or pain Patient not taking: Reported on 08/18/2015 03/11/15   Tanda Rockers, MD     Allergies  Allergen Reactions  . Lipitor [Atorvastatin Calcium] Other (See Comments)    Reaction unknown       Objective:  Physical Exam  Constitutional: He is oriented to person, place, and time. He appears well-developed and well-nourished.  HENT:  Head: Normocephalic and atraumatic.  Neck: Neck supple.  Limited active or passive ROM at the cervical spine. Flexion limited to approximately 20 degrees and bilateral rotation approximately 30-40 degrees. Neck tension present.   Musculoskeletal: He exhibits tenderness. He exhibits no edema.  See physical exam for neck  Neurological: He is alert and oriented to person, place, and time.  Skin: Skin is warm and dry.  Psychiatric: He has a normal mood and affect. His behavior is normal. Judgment and thought content normal.       .Dg Cervical Spine Complete  Result Date: 08/18/2015 CLINICAL DATA:  Acute onset of neck stiffness and limited range of motion. EXAM: CERVICAL SPINE - COMPLETE 4+ VIEW COMPARISON:  None. FINDINGS: Straightening of cervical alignment. Disc space narrowing at C4-5, C5-6 and C6-7 consistent with chronic spondylosis.  Facet arthropathy at C3-4, C4-5 and C5-6. Foraminal encroachment by osteophytes on the right at C3-4, C5-6 and C6-7 and on the left to a lesser degree at C3-4, C5-6 and C6-7. No soft tissue swelling. No acute finding. Surgical clips present in the neck. Carotid vascular calcification. IMPRESSION: No acute radiographic finding. Chronic appearing degenerative spondylosis and facet arthropathy. Electronically Signed   By: Nelson Chimes M.D.   On: 08/18/2015 11:28   Assessment & Plan:  .1. Neck pain - DG Cervical Spine Complete - AMB referral to orthopedics for further evaluation of degenerative spondylosis and facet arthropathy  2. Encounter for immunization Received Tdap and Pneumococcal (PVSV23)  Follow-up as needed.  Carroll Sage. Kenton Kingfisher, MSN, FNP-C [facility]

## 2015-08-18 NOTE — Patient Instructions (Addendum)
Orthopedics will follow-up with you to schedule your appointment.  Start Prednisone is muscle relaxant does not improve neck pain.     IF you received an x-ray today, you will receive an invoice from [facility]. Please contact [facility] at [phone] with questions or concerns regarding your invoice.   IF you received labwork today, you will receive an invoice from [employer]. Please contact [facility] at [phone] with questions or concerns regarding your invoice.   Our billing staff will not be able to assist you with questions regarding bills from these companies.  You will be contacted with the lab results as soon as they are available. The fastest way to get your results is to activate your My Chart account. Instructions are located on the last page of this paperwork. If you have not heard from [place] regarding the results in 2 weeks, please contact this office.    Neck  Cervical Sprain A cervical sprain is an injury in the neck in which the strong, fibrous tissues (ligaments) that connect your neck bones stretch or tear. Cervical sprains can range from mild to severe. Severe cervical sprains can cause the neck vertebrae to be unstable. This can lead to damage of the spinal cord and can result in serious nervous system problems. The amount of time it takes for a cervical sprain to get better depends on the cause and extent of the injury. Most cervical sprains heal in 1 to 3 weeks. CAUSES  Severe cervical sprains may be caused by:   Contact sport injuries (such as from football, rugby, wrestling, hockey, auto racing, gymnastics, diving, martial arts, or boxing).   Motor vehicle collisions.   Whiplash injuries. This is an injury from a sudden forward and backward whipping movement of the head and neck.  Falls.  Mild cervical sprains may be caused by:   Being in an awkward position, such as while cradling a telephone between your ear  and shoulder.   Sitting in a chair that does not offer proper support.   Working at a poorly Landscape architect station.   Looking up or down for long periods of time.  SYMPTOMS   Pain, soreness, stiffness, or a burning sensation in the front, back, or sides of the neck. This discomfort may develop immediately after the injury or slowly, 24 hours or more after the injury.   Pain or tenderness directly in the middle of the back of the neck.   Shoulder or upper back pain.   Limited ability to move the neck.   Headache.   Dizziness.   Weakness, numbness, or tingling in the hands or arms.   Muscle spasms.   Difficulty swallowing or chewing.   Tenderness and swelling of the neck.  DIAGNOSIS  Most of the time your health care provider can diagnose a cervical sprain by taking your history and doing a physical exam. Your health care provider will ask about previous neck injuries and any known neck problems, such as arthritis in the neck. X-rays may be taken to find out if there are any other problems, such as with the bones of the neck. Other tests, such as a CT scan or MRI, may also be needed.  TREATMENT  Treatment depends on the severity of the cervical sprain. Mild sprains can be treated with rest, keeping the neck in place (immobilization), and pain medicines. Severe cervical sprains are immediately immobilized. Further treatment is done to help with pain, muscle spasms, and other symptoms and may  include:  Medicines, such as pain relievers, numbing medicines, or muscle relaxants.   Physical therapy. This may involve stretching exercises, strengthening exercises, and posture training. Exercises and improved posture can help stabilize the neck, strengthen muscles, and help stop symptoms from returning.  HOME CARE INSTRUCTIONS   Put ice on the injured area.   Put ice in a plastic bag.   Place a towel between your skin and the bag.   Leave the ice on for 15-20  minutes, 3-4 times a day.   If your injury was severe, you may have been given a cervical collar to wear. A cervical collar is a two-piece collar designed to keep your neck from moving while it heals.  Do not remove the collar unless instructed by your health care provider.  If you have long hair, keep it outside of the collar.  Ask your health care provider before making any adjustments to your collar. Minor adjustments may be required over time to improve comfort and reduce pressure on your chin or on the back of your head.  Ifyou are allowed to remove the collar for cleaning or bathing, follow your health care provider's instructions on how to do so safely.  Keep your collar clean by wiping it with mild soap and water and drying it completely. If the collar you have been given includes removable pads, remove them every 1-2 days and hand wash them with soap and water. Allow them to air dry. They should be completely dry before you wear them in the collar.  If you are allowed to remove the collar for cleaning and bathing, wash and dry the skin of your neck. Check your skin for irritation or sores. If you see any, tell your health care provider.  Do not drive while wearing the collar.   Only take over-the-counter or prescription medicines for pain, discomfort, or fever as directed by your health care provider.   Keep all follow-up appointments as directed by your health care provider.   Keep all physical therapy appointments as directed by your health care provider.   Make any needed adjustments to your workstation to promote good posture.   Avoid positions and activities that make your symptoms worse.   Warm up and stretch before being active to help prevent problems.  SEEK MEDICAL CARE IF:   Your pain is not controlled with medicine.   You are unable to decrease your pain medicine over time as planned.   Your activity level is not improving as expected.  SEEK  IMMEDIATE MEDICAL CARE IF:   You develop any bleeding.  You develop stomach upset.  You have signs of an allergic reaction to your medicine.   Your symptoms get worse.   You develop new, unexplained symptoms.   You have numbness, tingling, weakness, or paralysis in any part of your body.  MAKE SURE YOU:   Understand these instructions.  Will watch your condition.  Will get help right away if you are not doing well or get worse.   This information is not intended to replace advice given to you by your health care provider. Make sure you discuss any questions you have with your health care provider.   Document Released: 10/29/2006 Document Revised: 01/06/2013 Document Reviewed: 07/09/2012 Elsevier Interactive Patient Education [employer].

## 2015-08-29 ENCOUNTER — Other Ambulatory Visit: Payer: Federal, State, Local not specified - PPO | Admitting: *Deleted

## 2015-08-29 LAB — BASIC METABOLIC PANEL
BUN: 30 mg/dL — ABNORMAL HIGH (ref 7–25)
CHLORIDE: 101 mmol/L (ref 98–110)
CO2: 28 mmol/L (ref 20–31)
Calcium: 9 mg/dL (ref 8.6–10.3)
Creat: 1.52 mg/dL — ABNORMAL HIGH (ref 0.70–1.18)
GLUCOSE: 106 mg/dL — AB (ref 65–99)
POTASSIUM: 4 mmol/L (ref 3.5–5.3)
Sodium: 138 mmol/L (ref 135–146)

## 2015-08-29 LAB — HEPATIC FUNCTION PANEL
ALT: 28 U/L (ref 9–46)
AST: 28 U/L (ref 10–35)
Albumin: 3.7 g/dL (ref 3.6–5.1)
Alkaline Phosphatase: 94 U/L (ref 40–115)
BILIRUBIN TOTAL: 0.8 mg/dL (ref 0.2–1.2)
Bilirubin, Direct: 0.3 mg/dL — ABNORMAL HIGH (ref <=0.2)
Indirect Bilirubin: 0.5 mg/dL (ref 0.2–1.2)
Total Protein: 6.7 g/dL (ref 6.1–8.1)

## 2015-08-29 LAB — LIPID PANEL
CHOL/HDL RATIO: 3.2 ratio (ref <=5.0)
Cholesterol: 109 mg/dL — ABNORMAL LOW (ref 125–200)
HDL: 34 mg/dL — AB (ref >=40)
LDL CALC: 40 mg/dL (ref <130)
Triglycerides: 175 mg/dL — ABNORMAL HIGH (ref <150)
VLDL: 35 mg/dL — AB (ref <30)

## 2015-08-29 NOTE — Addendum Note (Signed)
Addended by: Eulis Foster on: 08/29/2015 08:36 AM   Modules accepted: Orders

## 2015-08-29 NOTE — Addendum Note (Signed)
Addended by: Eulis Foster on: 08/29/2015 08:43 AM   Modules accepted: Orders

## 2015-08-29 NOTE — Addendum Note (Signed)
Addended by: Eulis Foster on: 08/29/2015 08:42 AM   Modules accepted: Orders

## 2015-08-31 ENCOUNTER — Ambulatory Visit (INDEPENDENT_AMBULATORY_CARE_PROVIDER_SITE_OTHER): Payer: Federal, State, Local not specified - PPO | Admitting: Cardiovascular Disease

## 2015-08-31 ENCOUNTER — Encounter: Payer: Self-pay | Admitting: Cardiovascular Disease

## 2015-08-31 VITALS — BP 130/72 | HR 86 | Ht 70.0 in | Wt 231.0 lb

## 2015-08-31 DIAGNOSIS — I10 Essential (primary) hypertension: Secondary | ICD-10-CM | POA: Diagnosis not present

## 2015-08-31 DIAGNOSIS — I25118 Atherosclerotic heart disease of native coronary artery with other forms of angina pectoris: Secondary | ICD-10-CM | POA: Diagnosis not present

## 2015-08-31 DIAGNOSIS — I480 Paroxysmal atrial fibrillation: Secondary | ICD-10-CM | POA: Diagnosis not present

## 2015-08-31 DIAGNOSIS — E785 Hyperlipidemia, unspecified: Secondary | ICD-10-CM

## 2015-08-31 NOTE — Progress Notes (Signed)
Edward [name] Date of Birth  12/07/1940 [place]  [address]    [place]   [place], [place]  [address]    [place], [place]  [address] [phone]  Fax  [phone]  [phone]  Fax [phone]  Problem list:  1. Atrial fibrillation- - s/p MAZE procedure  -   2. Hypertension 3. Diabetes mellitus 4. Dyslipidemia 5. Peripheral vascular disease:-Status post right carotid endarterectomy and status post PTA by Dr. Sherren Mocha 6. History of GI bleed-currently not on antiplatelet agents or anticoagulants-,  he was hospitalized July 30, 2011 for GI bleed.  Was on Xarelto, ASA, plavix.  Xarelto and plavx were held. 7. Anemia 8. History of TIA 9. Pacer 10 CABG - MAZE - Jun 12, 2011  History of Present Illness:  Edward [name]  Is a 75 y.o. gentleman with a history as noted above. He had coronary artery bypass grafting in May of this year. He also had a Maze procedure.  April 23, 2012: Edward [name] presents today after seeing Dr. wall in the office several weeks ago. He had vascular surgery on his left leg. Since that time he's had significant edema. There is no significant edema on the right leg. He's tried elevating his leg but has had minimal success. His Lasix was increased.  He has been able to diurese about 5 pounds since that time.    He's had some shortness of breath climbing stairs. He's not any chest pains. He did not sleep well last night. This may explain his elevated blood pressure.    Jun 04, 2012: [name] was seen  In April for leg edema.  He has some infection associated with his vascular surgery leg would.  He saw Dr Scot Dock and his wound was debrided.   Echo revealed LV function.    He has done better on the Lasix 80 mg a day.  We have tried Lasix 40 mg a day but his dyspnea is worse.  He had CABG in May 2013.    Nov. 19, 2014; Edward [name] is very short of breath today.  He had a pacer revision ( revised atrial lead) by Dr.  Caryl Comes in Oct.  He has not noticed any improvement after the pacer revision.  He had GI bleeding and was seen by Dr. Birdena Jubilee.  He has not seen a GI doctor.     His breathing is much worse over the past several months.    He stopped his apirin this week.   Feb. 12, 2015: He has had some GI  bleeding .  Hb dropped.   EGD and colonoscopy did not show anything specific.  He had a Lexiscan myoview this week which was low risk for ischemia. His left ventricular systolic function is normal with an EF of 53%.   His EKG from 02/16/2013 reveals atrial /  ventricular pacing.     10/08/2014:  Doing well. Stays busy.  Not much regular exercise.   Retired from the [employer] office  On Eliquis - no blood in stool.     No CP , no dyspnea.    Feb. 8, 2017:  Edward [name] is seen today.   Wife Edward [name] has had 2 major heart surgeries.   Edward [name] has pneumonia - just finished some Abx and mucinex.  Had a dry cough.  No angina .   Seems to be doing better.    Aug. 16, 2017:  Doing ok  Current Outpatient Prescriptions on File Prior to Visit  Medication Sig Dispense Refill  . acetaminophen (TYLENOL) 500 MG tablet Take 500 mg by mouth every 6 (six) hours as needed for mild pain or moderate pain.     Marland Kitchen apixaban (ELIQUIS) 5 MG TABS tablet Take 1 tablet (5 mg total) by mouth 2 (two) times daily. 180 tablet 3  . carvedilol (COREG) 25 MG tablet TAKE 1 TABLET (25 MG TOTAL) BY MOUTH 2 (TWO) TIMES DAILY. 60 tablet 8  . cyclobenzaprine (FLEXERIL) 10 MG tablet Take 1 tablet (10 mg total) by mouth 3 (three) times daily as needed for muscle spasms. 30 tablet 0  . diphenhydramine-acetaminophen (TYLENOL PM) 25-500 MG TABS Take 1 tablet by mouth at bedtime as needed (sleep).     . famotidine (PEPCID) 20 MG tablet Take 20 mg by mouth daily.    . furosemide (LASIX) 40 MG tablet Take 2 tablets (80 mg total) by mouth daily. 180 tablet 2  . glipiZIDE (GLUCOTROL) 5 MG tablet Take 5 mg by mouth 3 (three) times daily.     . Iron Combinations  (IRON COMPLEX PO) Take 2 capsules by mouth daily.    Marland Kitchen L-Methylfolate-Algae-B12-B6 (METANX) 3-90.314-2-35 MG CAPS TAKE ONE CAPSULE BY MOUTH EVERY DAY 15 capsule 0  . loratadine (CLARITIN) 10 MG tablet Take 10 mg by mouth daily.    Marland Kitchen losartan (COZAAR) 100 MG tablet TAKE 1 TABLET BY MOUTH EVERY DAY 90 tablet 3  . Magnesium Oxide 420 MG TABS Take 420 mg by mouth daily.    . metFORMIN (GLUCOPHAGE) 1000 MG tablet Take 1,000 mg by mouth 2 (two) times daily with a meal.    . Multiple Vitamin (MULTIVITAMIN WITH MINERALS) TABS Take 1 tablet by mouth daily.    Marland Kitchen NOVOTWIST 32G X 5 MM MISC     . omeprazole (PRILOSEC) 40 MG capsule TAKE 1 CAPSULE 30 TO 60 MINUTES BEFORE YOUR FIRST AND LAST MEALS OF THE DAY 60 capsule 2  . ONETOUCH DELICA LANCETS FINE MISC 1 each by other route daily. 1/day 250.62 100 each 3  . ONETOUCH VERIO test strip     . potassium chloride SA (KLOR-CON M20) 20 MEQ tablet Take 1 tablet (20 mEq total) by mouth daily. 90 tablet 0  . pravastatin (PRAVACHOL) 80 MG tablet Take 1 tablet (80 mg total) by mouth daily. 90 tablet 0  . Tiotropium Bromide Monohydrate (SPIRIVA RESPIMAT) 2.5 MCG/ACT AERS Inhale 2 puffs into the lungs daily. 4 g 5  . traMADol (ULTRAM) 50 MG tablet 1-2 every 4 hours as needed for cough or pain 40 tablet 0  . triamcinolone cream (KENALOG) 0.1 % Apply 1 application topically as needed (rash).      No current facility-administered medications on file prior to visit.     Allergies  Allergen Reactions  . Lipitor [Atorvastatin Calcium] Other (See Comments)    Reaction unknown    Past Medical History:  Diagnosis Date  . A-fib ([place])   . Anemia    ?  . Carotid artery occlusion   . Claudication ([place])   . Coronary artery disease   . Diabetic neuropathy ([facility])    mostly feet/ legs  . Diastolic CHF ([place]) 04/17/5954  . Dyslipidemia   . Exertional shortness of breath   . GERD (gastroesophageal reflux disease)   . HTN (hypertension)   . Hyperlipidemia   . Neuromuscular  disorder ([facility])    neuropathy  . Pacemaker   . PVD (peripheral vascular disease) ([place])  s/p Right CEA March, 2011,   . Stroke [facility])    ?  Marland Kitchen Type II diabetes mellitus ([place])     Past Surgical History:  Procedure Laterality Date  . ABDOMINAL AORTAGRAM N/A 01/07/2012   Procedure: ABDOMINAL Maxcine Ham;  Surgeon: Angelia Mould, MD;  Location: [facility] CATH LAB;  Service: Cardiovascular;  Laterality: N/A;  . BALLOON DILATION N/A 02/17/2013   Procedure: BALLOON DILATION;  Surgeon: Jerene Bears, MD;  Location: [facility] ENDOSCOPY;  Service: Gastroenterology;  Laterality: N/A;  . CAROTID ENDARTERECTOMY Right 03/2009  . CHOLECYSTECTOMY    . CIRCUMCISION    . COLONOSCOPY WITH PROPOFOL N/A 02/17/2013   Procedure: COLONOSCOPY WITH PROPOFOL;  Surgeon: Jerene Bears, MD;  Location: [facility] ENDOSCOPY;  Service: Gastroenterology;  Laterality: N/A;  . CORONARY ARTERY BYPASS GRAFT  06/14/2011   Procedure: CORONARY ARTERY BYPASS GRAFTING (CABG);  Surgeon: Gaye Pollack, MD;  Location: [place];  Service: Open Heart Surgery;  Laterality: N/A;  Coronary Artery Bypass Graft on pump times six;  utilizing internal mammary artery and right greater saphenous vein harvested endoscopically.x 5 vessels  . ELECTROPHYSIOLOGY STUDY N/A 11/05/2012   Procedure: ELECTROPHYSIOLOGY STUDY;  Surgeon: Deboraha Sprang, MD;  [facility] C[facility] LAB;  Service: Cardiovascular;  Laterality: N/A;  . ESOPHAGOGASTRODUODENOSCOPY (EGD) WITH PROPOFOL N/A 02/17/2013   Procedure: ESOPHAGOGASTRODUODENOSCOPY (EGD) WITH PROPOFOL;  Surgeon: Jerene Bears, MD;  Location: [facility] ENDOS[facility];  Service: Gastroenterology;  Laterality: N/A;  . FEMORAL-POPLITEAL BYPASS GRAFT Left 03/06/2012   Procedure: BYPASS GRAFT FEMORAL-POPLITEAL ARTERY;  Surgeon: Angelia Mould, MD;  Location: [place];  Service: Vascular;  Laterality: Left;  Left Femoral - Below Knee Popliteal Bypass Graft with Vein and Intraoperative Arteriogram.  . INSERT / REPLACE / REMOVE PACEMAKER  2013   /notes  11/05/2012-[employer]  . JOINT REPLACEMENT Left    left TKA  . KNEE ARTHROSCOPY Left    "I had 3" (11/05/2012)  . LEAD REVISION  11/05/2012   pacemaker/notes 11/05/2012  . LEAD REVISION N/A 11/05/2012   Procedure: LEAD REVISION;  Surgeon: Deboraha Sprang, MD;  Location: [facility] CATH LAB;  Service: Cardiovascular;  Laterality: N/A;  . MAZE  06/14/2011   Procedure: MAZE;  Surgeon: Gaye Pollack, MD;  Location: [place];  Service: Open Heart Surgery;  Laterality: N/A;  [place] left atrial appendage  . PERMANENT PACEMAKER INSERTION N/A 06/20/2011   Procedure: PERMANENT PACEMAKER INSERTION;  Surgeon: Deboraha Sprang, MD;  Location[facility] C[facility] LAB;  Service: Cardiovascular;  Laterality: N/A;  . TONSILLECTOMY AND ADENOIDECTOMY    . TOTAL KNEE ARTHROPLASTY Left     History  Smoking Status  . Former Smoker  . Packs/day: 3.00  . Years: 32.00  . Types: Cigarettes  . Quit date: 01/16/1979  Smokeless Tobacco  . Never Used    History  Alcohol Use  . 1.2 oz/week  . 2 Glasses of wine per week    Comment: beer 1-2 weekly (occasional)    Family History  Problem Relation Age of Onset  . Coronary artery disease Father   . Hypertension Father   . Heart disease Father     Heart Disease before age 47  . Heart attack Mother   . Coronary artery disease Mother   . Deep vein thrombosis Mother   . Heart disease Mother     Heart Disease before age 16  . Hyperlipidemia Mother   . Hypertension Mother   . Hypertension Sister   . Heart disease Sister  Heart Disease before age 14  . Hyperlipidemia Sister   . Heart disease Brother     Heart Disease before age 67  . Anesthesia problems Neg Hx   . Diabetes Neg Hx   . Colon cancer Neg Hx     Reviw of Systems:  Reviewed in the HPI.  All other systems are negative.  Physical Exam: BP (!) 148/90 (BP Location: Left Arm, Patient Position: Sitting, Cuff Size: Normal)   Pulse 86   Ht 5' 10"  (1.778 m)   Wt 231 lb (104.8 kg)   BMI 33.15 kg/m  The patient is  alert and oriented x 3.  The mood and affect are normal.   Skin: warm and dry.      HEENT:   Normocephalic/atraumatic. He has a right carotid endarterectomy scar. His carotids are 2+. No JVD. His neck is supple. The membranes are moist.  Lungs: Lungs are clear   Heart: Regular rate. No murmurs.  His sternotomy scar is healing well. His thoracostomy sites are healing well.  Abdomen: Good bowel sounds. , NT   Extremities:  No  edema  Neuro:  Neuro exam is nonfocal.    ECG:  Assessment / Plan:   1. Atrial fibrillation- - s/p MAZE procedure  -  - has   been followed by Dr. Caryl Comes. Continue Eliquis. 2. Hypertension-   blood pressure is well-controlled. Continue current medications. 3. Diabetes mellitus 4. Dyslipidemia- we'll check fasting lipids at his next visit.  5. Peripheral vascular disease:-Status post right carotid endarterectomy and status post PTA by Dr. Sherren Mocha 6. History of GI bleed- doing well on Eliquis 7. Anemia 8. History of TIA 9. Pacer 10 CABG - MAZE - Jun 12, 2011 -   recent episodes of angina. Continue current medications.    Mertie Moores, MD  08/31/2015 10:01 AM    [address],  [place], [place]  [address] Pager [phone] Phone: [phone]; Fax: [phone]   [facility]  [address] [place], [place]  [address] [phone]   Fax [phone]

## 2015-08-31 NOTE — Patient Instructions (Signed)

## 2015-09-14 ENCOUNTER — Encounter: Payer: Self-pay | Admitting: Emergency Medicine

## 2015-09-14 LAB — HM DIABETES EYE EXAM

## 2015-09-16 ENCOUNTER — Encounter: Payer: Self-pay | Admitting: Cardiovascular Disease

## 2015-10-20 ENCOUNTER — Ambulatory Visit (INDEPENDENT_AMBULATORY_CARE_PROVIDER_SITE_OTHER): Payer: Federal, State, Local not specified - PPO | Admitting: Physician Assistant

## 2015-10-20 ENCOUNTER — Encounter: Payer: Self-pay | Admitting: Physician Assistant

## 2015-10-20 VITALS — BP 160/90 | HR 93 | Ht 70.0 in | Wt 231.8 lb

## 2015-10-20 DIAGNOSIS — I10 Essential (primary) hypertension: Secondary | ICD-10-CM | POA: Diagnosis not present

## 2015-10-20 DIAGNOSIS — I480 Paroxysmal atrial fibrillation: Secondary | ICD-10-CM

## 2015-10-20 DIAGNOSIS — I2510 Atherosclerotic heart disease of native coronary artery without angina pectoris: Secondary | ICD-10-CM | POA: Diagnosis not present

## 2015-10-20 DIAGNOSIS — E785 Hyperlipidemia, unspecified: Secondary | ICD-10-CM

## 2015-10-20 DIAGNOSIS — I5032 Chronic diastolic (congestive) heart failure: Secondary | ICD-10-CM

## 2015-10-20 MED ORDER — AMLODIPINE BESYLATE 5 MG PO TABS: 5.0000 mg | ORAL_TABLET | Freq: Every day | ORAL | 3 refills | Status: DC

## 2015-10-20 NOTE — Patient Instructions (Signed)
Medication Instructions:  Your physician has recommended you make the following change in your medication:  1. Start Amlodipine (5 mg ) daily   Labwork: -None  Testing/Procedures: -None  Follow-Up: Your physician wants you to follow-up in: February with Dr. Acie Fredrickson.  You will receive a reminder letter in the mail two months in advance. If you don't receive a letter, please call our office to schedule the follow-up appointment.   Any Other Special Instructions Will Be Listed Below (If Applicable).  Call if blood pressure >140 or >90 @ [phone].   If you need a refill on your cardiac medications before your next appointment, please call your pharmacy.

## 2015-10-20 NOTE — Progress Notes (Addendum)
Cardiology Office Note    Date:  10/20/2015  ID:  Edward [name], DOB September 27, 1940, MRN [id number] PCP:  Molli Barrows, FNP  Cardiologist:  Dr. Acie Fredrickson - best suited to see male providers in future (see Dispo below)   Chief Complaint: elevated blood pressure  History of Present Illness:  Edward [name] is a 75 y.o. male with history of paroxysmal atrial fib s/p MAZE, CAD s/p CABG/MAZE 05/2011, sinus node dysfunction s/p pacemaker with subsequent atrial lead revision, chronic diastolic CHF, HTN, DM, dyslipidemia, PVD (s/p right CEA, prior PTA, left fem-pop 2014), h/o GIB (anticoagulants previously held due to this), anemia, h/o TIA 2012, neuropathy, quesiton of stroke per chart who presents for evaluation of high blood pressure. Last nuc 2015 showed low risk, small/mod scar of apex as noted in past, no ischemia, EF 53%.  Per review with Dr. Elmarie Shiley nurse, the patient's wife was seeing Dr. Acie Fredrickson today and the patient began deflecting conversation towards his own recent issues and thus was added on to my schedule to give proper attention to his recently uncontrolled hypertension. He is trying to undergo a dental crown procedure but this keeps getting deferred because his BP has been spiking - they are refusing to do it until his blood pressure is controlled. He states his baseline BP runs 140/90. He says he doesn't see a problem with that. His BP when spiking has gone up to [id number] systolic. Initial check in clinic was 148/90 with follow-up BP 164/92 in the left arm and 160/90 in the right arm. He denies any CP or SOB. Does get occasional bendopnea and has chronic mild LEE. No syncope. During the interview his demeanor was somewhat abrasive and skeptical.   Past Medical History:  Diagnosis Date  . A-fib ([place])   . Anemia    ?  . Carotid artery occlusion   . Claudication ([place])   . Coronary artery disease   . Diabetic neuropathy (HCC)    mostly feet/ legs  . Diastolic CHF ([place]) 04/24/4494  .  Dyslipidemia   . Exertional shortness of breath   . GERD (gastroesophageal reflux disease)   . HTN (hypertension)   . Hyperlipidemia   . Neuromuscular disorder (HCC)    neuropathy  . Pacemaker   . PVD (peripheral vascular disease) ([place])    s/p Right CEA March, 2011,   . Stroke [facility])    ?  Marland Kitchen Type II diabetes mellitus ([place])     Past Surgical History:  Procedure Laterality Date  . ABDOMINAL AORTAGRAM N/A 01/07/2012   Procedure: ABDOMINAL Maxcine Ham;  Surgeon: Angelia Mould, MD;  [facility] LAB;  Service: Cardiovascular;  Laterality: N/A;  . BALLOON DILATION N/A 02/17/2013   Procedure: BALLOON DILATION;  Surgeon: Jerene Bears, MD;  [facility];  Service: Gastroenterology;  Laterality: N/A;  . CAROTID ENDARTERECTOMY Right 03/2009  . CHOLECYSTECTOMY    . CIRCUMCISION    . COLONOSCOPY WITH PROPOFOL N/A 02/17/2013   Procedure: COLONOSCOPY WITH PROPOFOL;  Surgeon: Jerene Bears, MD;  [facility];  Service: Gastroenterology;  Laterality: N/A;  . CORONARY ARTERY BYPASS GRAFT  06/14/2011   Procedure: CORONARY ARTERY BYPASS GRAFTING (CABG);  Surgeon: Gaye Pollack, MD;  Location: [place];  Service: Open Heart Surgery;  Laterality: N/A;  Coronary Artery Bypass Graft on pump times six;  utilizing internal mammary artery and right greater saphenous vein harvested endoscopically.x 5 vessels  . ELECTROPHYSIOLOGY STUDY N/A 11/05/2012   Procedure: ELECTROPHYSIOLOGY STUDY;  Surgeon:  Deboraha Sprang, MD;  Location: [facility] CATH LAB;  Service: Cardiovascular;  Laterality: N/A;  . ESOPHAGOGASTRODUODENOSCOPY (EGD) WITH PROPOFOL N/A 02/17/2013   Procedure: ESOPHAGOGASTRODUODENOSCOPY (EGD) WITH PROPOFOL;  Surgeon: Jerene Bears, MD;  [facility];  Service: Gastroenterology;  Laterality: N/A;  . FEMORAL-POPLITEAL BYPASS GRAFT Left 03/06/2012   Procedure: BYPASS GRAFT FEMORAL-POPLITEAL ARTERY;  Surgeon: Angelia Mould, MD;  Location: [place];  Service: Vascular;  Laterality:  Left;  Left Femoral - Below Knee Popliteal Bypass Graft with Vein and Intraoperative Arteriogram.  . INSERT / REPLACE / REMOVE PACEMAKER  2013   /notes 11/05/2012-[employer]  . JOINT REPLACEMENT Left    left TKA  . KNEE ARTHROSCOPY Left    "I had 3" (11/05/2012)  . LEAD REVISION  11/05/2012   pacemaker/notes 11/05/2012  . LEAD REVISION N/A 11/05/2012   Procedure: LEAD REVISION;  Surgeon: Deboraha Sprang, MD;  Location: [facility] CATH LAB;  Service: Cardiovascular;  Laterality: N/A;  . MAZE  06/14/2011   Procedure: MAZE;  Surgeon: Gaye Pollack, MD;  Location: [place];  Service: Open Heart Surgery;  Laterality: N/A;  Ligate left atrial appendage  . PERMANENT PACEMAKER INSERTION N/A 06/20/2011   Procedure: PERMANENT PACEMAKER INSERTION;  Surgeon: Deboraha Sprang, MD;  Location: [facility] CATH LAB;  Service: Cardiovascular;  Laterality: N/A;  . TONSILLECTOMY AND ADENOIDECTOMY    . TOTAL KNEE ARTHROPLASTY Left     Current Medications: Current Outpatient Prescriptions  Medication Sig Dispense Refill  . acetaminophen (TYLENOL) 500 MG tablet Take 500 mg by mouth every 6 (six) hours as needed for mild pain or moderate pain.     Marland Kitchen apixaban (ELIQUIS) 5 MG TABS tablet Take 1 tablet (5 mg total) by mouth 2 (two) times daily. 180 tablet 3  . carvedilol (COREG) 25 MG tablet TAKE 1 TABLET (25 MG TOTAL) BY MOUTH 2 (TWO) TIMES DAILY. 60 tablet 8  . cyclobenzaprine (FLEXERIL) 10 MG tablet Take 1 tablet (10 mg total) by mouth 3 (three) times daily as needed for muscle spasms. 30 tablet 0  . diphenhydramine-acetaminophen (TYLENOL PM) 25-500 MG TABS Take 1 tablet by mouth at bedtime as needed (sleep).     . famotidine (PEPCID) 20 MG tablet Take 20 mg by mouth daily.    . furosemide (LASIX) 40 MG tablet Take 2 tablets (80 mg total) by mouth daily. 180 tablet 2  . glipiZIDE (GLUCOTROL) 5 MG tablet Take 5 mg by mouth 3 (three) times daily.     . Iron Combinations (IRON COMPLEX PO) Take 2 capsules by mouth daily.    Marland Kitchen  L-Methylfolate-Algae-B12-B6 (METANX) 3-90.314-2-35 MG CAPS TAKE ONE CAPSULE BY MOUTH EVERY DAY 15 capsule 0  . loratadine (CLARITIN) 10 MG tablet Take 10 mg by mouth daily.    Marland Kitchen losartan (COZAAR) 100 MG tablet TAKE 1 TABLET BY MOUTH EVERY DAY 90 tablet 3  . Magnesium Oxide 420 MG TABS Take 420 mg by mouth daily.    . metFORMIN (GLUCOPHAGE) 1000 MG tablet Take 1,000 mg by mouth 2 (two) times daily with a meal.    . Multiple Vitamin (MULTIVITAMIN WITH MINERALS) TABS Take 1 tablet by mouth daily.    Marland Kitchen NOVOTWIST 32G X 5 MM MISC     . omeprazole (PRILOSEC) 40 MG capsule TAKE 1 CAPSULE 30 TO 60 MINUTES BEFORE YOUR FIRST AND LAST MEALS OF THE DAY 60 capsule 2  . ONETOUCH DELICA LANCETS FINE MISC 1 each by other route daily. 1/day 250.62 100 each 3  .  ONETOUCH VERIO test strip     . potassium chloride SA (KLOR-CON M20) 20 MEQ tablet Take 1 tablet (20 mEq total) by mouth daily. 90 tablet 0  . pravastatin (PRAVACHOL) 80 MG tablet Take 1 tablet (80 mg total) by mouth daily. 90 tablet 0  . Tiotropium Bromide Monohydrate (SPIRIVA RESPIMAT) 2.5 MCG/ACT AERS Inhale 2 puffs into the lungs daily. 4 g 5  . traMADol (ULTRAM) 50 MG tablet 1-2 every 4 hours as needed for cough or pain 40 tablet 0  . triamcinolone cream (KENALOG) 0.1 % Apply 1 application topically as needed (rash).      No current facility-administered medications for this visit.      Allergies:   Lipitor [atorvastatin calcium]   Social History   Social History  . Marital status: Married    Spouse name: N/A  . Number of children: N/A  . Years of education: N/A   Social History Main Topics  . Smoking status: Former Smoker    Packs/day: 3.00    Years: 32.00    Types: Cigarettes    Quit date: 01/16/1979  . Smokeless tobacco: Never Used  . Alcohol use 1.2 oz/week    2 Glasses of wine per week     Comment: beer 1-2 weekly (occasional)  . Drug use: No  . Sexual activity: Not Currently   Other Topics Concern  . None   Social History  Narrative  . None     Family History:  The patient's family history includes Coronary artery disease in his father and mother; Deep vein thrombosis in his mother; Heart attack in his mother; Heart disease in his brother, father, mother, and sister; Hyperlipidemia in his mother and sister; Hypertension in his father, mother, and sister.   ROS:   Please see the history of present illness.  All other systems are reviewed and otherwise negative.    PHYSICAL EXAM:   VS:  BP (!) 164/92 (BP Location: Left Arm, Cuff Size: Normal)   Pulse 93   Ht 5' 10"  (1.778 m)   Wt 231 lb 12.8 oz (105.1 kg)   BMI 33.26 kg/m   BMI: Body mass index is 33.26 kg/m. GEN: obese WM in no acute distress  HEENT: normocephalic, atraumatic Neck: no JVD, carotid bruits, or masses Cardiac: RRR; no murmurs, rubs, or gallops, trace ankle edema bilaterally Respiratory:  clear to auscultation bilaterally, normal work of breathing GI: soft, nontender, nondistended, + BS MS: no deformity or atrophy  Skin: warm and dry, no rash Neuro:  Alert and Oriented x 3, Strength and sensation are intact, follows commands Psych: euthymic mood, full affect  Wt Readings from Last 3 Encounters:  10/20/15 231 lb 12.8 oz (105.1 kg)  08/31/15 231 lb (104.8 kg)  08/18/15 235 lb (106.6 kg)      Studies/Labs Reviewed:   EKG:  EKG was ordered today and personally reviewed by me and demonstrates AV paced 93bpm  Recent Labs: 02/15/2015: Hemoglobin 12.5 08/29/2015: ALT 28; BUN 30; Creat 1.52; Potassium 4.0; Sodium 138   Lipid Panel    Component Value Date/Time   CHOL 109 (L) 08/29/2015 0843   TRIG 175 (H) 08/29/2015 0843   HDL 34 (L) 08/29/2015 0843   CHOLHDL 3.2 08/29/2015 0843   VLDL 35 (H) 08/29/2015 0843   LDLCALC 40 08/29/2015 0843    Additional studies/ records that were reviewed today include: Summarized above.    ASSESSMENT & PLAN:   1. Essential HTN - long talk with patient to try  to explain the ill effects of  uncontrolled hypertension. He states his only goal is to get it down right now so that he can go ahead with the dental surgery. Will add amlodipine 51m daily. If his edema worsens may need to consider changing to a different agent, but improved BP control may actually help his chronic diastolic CHF. The patient declined invitation to follow in pharmD clinic for HTN. He will follow BP at home. Advised for him to follow daily and call if running >>062systolic or 937+diastolic. Ideally would like <130/80 but he has h/o orthostatic type symptoms in the past so will be cautious. Low sodium diet reviewed with patient (max 2g). He says he uses a spice called Sazon to season his food. I said I did not know what that was at which time he said to his wife, "the only thing she looks like she makes is reservations." I told him I actually really enjoy cooking and would research the spice. It appears this contains 246mper serving which is greater than 10% of the daily intake. Sent a mychart message to let him know this is a high sodium seasoning and to consider alternatives. If BP remains uncontrolled could consider renal artery duplex given his PVD. 2. CAD in native artery - no recent angina. 3. PAF - maintaining NSR. Continue current regimen. 4. Hyperlipidemia - continue statin. 5. Chronic diastolic CHF - as above. Weight stable.  Disposition: F/u with Dr.Nahser as previously scheduled. Patient declined follow up in pharmD clinic. He asked the nurse why he is not following up with me as he'd like to given the way I look. When he was being checked in for his EKG by the tech, he made inappropriate comments about his nipples and getting excitement where he can get it after you've been married that long. I spoke to TeGeorgana CurioclPublishing copyabout this interaction. I think he'd be best suited scheduling with a male provider only in the future.   Medication Adjustments/Labs and Tests Ordered: Current medicines are  reviewed at length with the patient today.  Concerns regarding medicines are outlined above. Medication changes, Labs and Tests ordered today are summarized above and listed in the Patient Instructions accessible in Encounters.   SiRaechel AcheA-C  10/20/2015 4:01 PM    [facility] [address]  [id number]: [phone]: [phone]

## 2015-10-24 ENCOUNTER — Encounter: Payer: Self-pay | Admitting: Physician Assistant

## 2015-10-27 ENCOUNTER — Other Ambulatory Visit: Payer: Self-pay | Admitting: Cardiovascular Disease

## 2015-11-05 ENCOUNTER — Other Ambulatory Visit: Payer: Self-pay | Admitting: Cardiovascular Disease

## 2015-11-05 ENCOUNTER — Other Ambulatory Visit: Payer: Self-pay | Admitting: Internal Medicine

## 2015-11-08 ENCOUNTER — Other Ambulatory Visit: Payer: Self-pay | Admitting: Cardiovascular Disease

## 2015-12-07 ENCOUNTER — Encounter: Payer: Self-pay | Admitting: Cardiovascular Disease

## 2015-12-07 ENCOUNTER — Ambulatory Visit (INDEPENDENT_AMBULATORY_CARE_PROVIDER_SITE_OTHER): Payer: Federal, State, Local not specified - PPO | Admitting: Cardiovascular Disease

## 2015-12-07 VITALS — BP 150/90 | HR 90 | Ht 70.0 in | Wt 231.1 lb

## 2015-12-07 DIAGNOSIS — I2510 Atherosclerotic heart disease of native coronary artery without angina pectoris: Secondary | ICD-10-CM | POA: Diagnosis not present

## 2015-12-07 DIAGNOSIS — R5383 Other fatigue: Secondary | ICD-10-CM | POA: Diagnosis not present

## 2015-12-07 DIAGNOSIS — I5032 Chronic diastolic (congestive) heart failure: Secondary | ICD-10-CM

## 2015-12-07 DIAGNOSIS — E785 Hyperlipidemia, unspecified: Secondary | ICD-10-CM | POA: Diagnosis not present

## 2015-12-07 LAB — COMPREHENSIVE METABOLIC PANEL
ALBUMIN: 4.2 g/dL (ref 3.6–5.1)
ALT: 17 U/L (ref 9–46)
AST: 25 U/L (ref 10–35)
Alkaline Phosphatase: 88 U/L (ref 40–115)
BUN: 19 mg/dL (ref 7–25)
CALCIUM: 9.2 mg/dL (ref 8.6–10.3)
CHLORIDE: 102 mmol/L (ref 98–110)
CO2: 25 mmol/L (ref 20–31)
CREATININE: 1.56 mg/dL — AB (ref 0.70–1.18)
Glucose, Bld: 140 mg/dL — ABNORMAL HIGH (ref 65–99)
POTASSIUM: 4.1 mmol/L (ref 3.5–5.3)
Sodium: 139 mmol/L (ref 135–146)
TOTAL PROTEIN: 7.8 g/dL (ref 6.1–8.1)
Total Bilirubin: 1.1 mg/dL (ref 0.2–1.2)

## 2015-12-07 LAB — LIPID PANEL
CHOL/HDL RATIO: 3.3 ratio (ref <5.0)
CHOLESTEROL: 101 mg/dL (ref <200)
HDL: 31 mg/dL — ABNORMAL LOW (ref >40)
LDL Cholesterol: 42 mg/dL (ref <100)
TRIGLYCERIDES: 142 mg/dL (ref <150)
VLDL: 28 mg/dL (ref <30)

## 2015-12-07 LAB — CBC
HEMATOCRIT: 39.3 % (ref 38.5–50.0)
HEMOGLOBIN: 12.6 g/dL — AB (ref 13.2–17.1)
MCH: 24.9 pg — ABNORMAL LOW (ref 27.0–33.0)
MCHC: 32.1 g/dL (ref 32.0–36.0)
MCV: 77.7 fL — ABNORMAL LOW (ref 80.0–100.0)
MPV: 10 fL (ref 7.5–12.5)
Platelets: 214 10*3/uL (ref 140–400)
RBC: 5.06 MIL/uL (ref 4.20–5.80)
RDW: 16.7 % — ABNORMAL HIGH (ref 11.0–15.0)
WBC: 9.5 10*3/uL (ref 3.8–10.8)

## 2015-12-07 LAB — TSH: TSH: 3.28 mIU/L (ref 0.40–4.50)

## 2015-12-07 NOTE — Progress Notes (Signed)
[name] Date of Birth  [birth year] [place]  [address]    [place]   [place], [place]  [address]    [place],   [address] [phone]  Fax  [phone]  [phone]  Fax [phone]  Problem list:  1. Atrial fibrillation- - s/p MAZE procedure  -   2. Hypertension 3. Diabetes mellitus 4. Dyslipidemia 5. Peripheral vascular disease:-Status post right carotid endarterectomy and status post PTA by Dr. Sherren Mocha, s/p left leg vascular procedure Gae Gallop, MD )   6. History of GI bleed-currently not on antiplatelet agents or anticoagulants-,  he was hospitalized July 30, 2011 for GI bleed.  Was on Xarelto, ASA, plavix.  Xarelto and plavx were held. 7. Anemia 8. History of TIA 9. Pacer 10 CABG - MAZE - Jun 12, 2011    [place]  Is a 75 y.o. gentleman with a history as noted above. He had coronary artery bypass grafting in May of this year. He also had a Maze procedure.  April 23, 2012: Markian presents today after seeing Dr. wall in the office several weeks ago. He had vascular surgery on his left leg. Since that time he's had significant edema. There is no significant edema on the right leg. He's tried elevating his leg but has had minimal success. His Lasix was increased.  He has been able to diurese about 5 pounds since that time.    He's had some shortness of breath climbing stairs. He's not any chest pains. He did not sleep well last night. This may explain his elevated blood pressure.    Jun 04, 2012: Duy was seen  In April for leg edema.  He has some infection associated with his vascular surgery leg would.  He saw Dr Scot Dock and his wound was debrided.   Echo revealed LV function.    He has done better on the Lasix 80 mg a day.  We have tried Lasix 40 mg a day but his dyspnea is worse.  He had CABG in May 2013.    Nov. 19, 2014; Talbot is very short of breath today.  He had a pacer revision  ( revised atrial lead) by Dr. Caryl Comes in Oct.  He has not noticed any improvement after the pacer revision.  He had GI bleeding and was seen by Dr. Birdena Jubilee.  He has not seen a GI doctor.     His breathing is much worse over the past several months.    He stopped his apirin this week.   Feb. 12, 2015: He has had some GI  bleeding .  Hb dropped.   EGD and colonoscopy did not show anything specific.  He had a Lexiscan myoview this week which was low risk for ischemia. His left ventricular systolic function is normal with an EF of 53%.   His EKG from 02/16/2013 reveals atrial /  ventricular pacing.     10/08/2014:  Doing well. Stays busy.  Not much regular exercise.   Retired from the [employer] office  On Eliquis - no blood in stool.     No CP , no dyspnea.    Feb. 8, 2017:  Bartt is seen today.   Wife Myril has had 2 major heart surgeries.   Kristoff has pneumonia - just finished some Abx and mucinex.  Had a dry cough.  No angina .   Seems to be doing better.  Aug. 16, 2017:  Doing ok   Nov. 22, 2017:  Has been having trouble with fatigued, feels cold all the time , for the past months - is progressive.  Has not been to his primary MD. Has been going to the [place] in [place]. Toes have been discolored.  No CP  Breathing is ok, not great   Still eating some salty foods Chips, hot dogs.   Current Outpatient Prescriptions on File Prior to Visit  Medication Sig Dispense Refill  . acetaminophen (TYLENOL) 500 MG tablet Take 500 mg by mouth every 6 (six) hours as needed for mild pain or moderate pain.     Marland Kitchen amLODipine (NORVASC) 5 MG tablet Take 1 tablet (5 mg total) by mouth daily. 90 tablet 3  . apixaban (ELIQUIS) 5 MG TABS tablet Take 1 tablet (5 mg total) by mouth 2 (two) times daily. 180 tablet 3  . carvedilol (COREG) 25 MG tablet TAKE 1 TABLET (25 MG TOTAL) BY MOUTH 2 (TWO) TIMES DAILY. 60 tablet 8  . diphenhydramine-acetaminophen (TYLENOL PM) 25-500 MG TABS Take 1 tablet by mouth  at bedtime as needed (sleep).     . famotidine (PEPCID) 20 MG tablet Take 20 mg by mouth daily.    . furosemide (LASIX) 40 MG tablet Take 2 tablets (80 mg total) by mouth daily. 180 tablet 2  . glipiZIDE (GLUCOTROL) 5 MG tablet Take 5 mg by mouth 3 (three) times daily.     . Iron Combinations (IRON COMPLEX PO) Take 2 capsules by mouth daily.    Marland Kitchen KLOR-CON M20 20 MEQ tablet TAKE 1 TABLET BY MOUTH EVERY DAY 90 tablet 3  . L-Methylfolate-Algae-B12-B6 (METANX) 3-90.314-2-35 MG CAPS TAKE ONE CAPSULE BY MOUTH EVERY DAY 15 capsule 0  . loratadine (CLARITIN) 10 MG tablet Take 10 mg by mouth daily.    Marland Kitchen losartan (COZAAR) 100 MG tablet TAKE 1 TABLET BY MOUTH EVERY DAY 90 tablet 3  . Magnesium Oxide 420 MG TABS Take 420 mg by mouth daily.    . metFORMIN (GLUCOPHAGE) 1000 MG tablet Take 1,000 mg by mouth 2 (two) times daily with a meal.    . Multiple Vitamin (MULTIVITAMIN WITH MINERALS) TABS Take 1 tablet by mouth daily.    Marland Kitchen NOVOTWIST 32G X 5 MM MISC     . omeprazole (PRILOSEC) 40 MG capsule TAKE 1 CAPSULE 30 TO 60 MINUTES BEFORE YOUR FIRST AND LAST MEALS OF THE DAY 60 capsule 2  . ONETOUCH DELICA LANCETS FINE MISC 1 each by other route daily. 1/day 250.62 100 each 3  . ONETOUCH VERIO test strip     . pravastatin (PRAVACHOL) 80 MG tablet TAKE 1 TABLET (80 MG TOTAL) BY MOUTH DAILY. 90 tablet 3  . triamcinolone cream (KENALOG) 0.1 % Apply 1 application topically as needed (rash).      No current facility-administered medications on file prior to visit.     Allergies  Allergen Reactions  . Lipitor [Atorvastatin Calcium] Other (See Comments)    Reaction unknown    Past Medical History:  Diagnosis Date  . Anemia    ?  . Carotid artery occlusion   . Chronic diastolic CHF (congestive heart failure) ([place]) 04/23/2012  . Claudication ([place])   . Coronary artery disease    s/p CABG 2013  . Diabetic neuropathy (HCC)    mostly feet/ legs  . Dyslipidemia   . Exertional shortness of breath   . GERD  (gastroesophageal reflux disease)   . GI bleed  while on triple anticoag therapy  . HTN (hypertension)   . Hyperlipidemia   . Neuromuscular disorder (HCC)    neuropathy  . Pacemaker   . PAF (paroxysmal atrial fibrillation) (HCC)    s/p MAZE at time of CABG  . PVD (peripheral vascular disease) ([place])    R CEA 2011, prior PTA, left fem-pop 2014  . Stroke ([place])    vs TIA  . Type II diabetes mellitus ([place])     Past Surgical History:  Procedure Laterality Date  . ABDOMINAL AORTAGRAM N/A 01/07/2012   Procedure: ABDOMINAL Maxcine Ham;  Surgeon: Angelia Mould, MD;  Location[facility] CATH LAB;  Service: Cardiovascular;  Laterality: N/A;  . BALLOON DILATION N/A 02/17/2013   Procedure: BALLOON DILATION;  Surgeon: Jerene Bears, MD;  [facility];  Service: Gastroenterology;  Laterality: N/A;  . CAROTID ENDARTERECTOMY Right 03/2009  . CHOLECYSTECTOMY    . CIRCUMCISION    . COLONOSCOPY WITH PROPOFOL N/A 02/17/2013   Procedure: COLONOSCOPY WITH PROPOFOL;  Surgeon: Jerene Bears, MD;  [facility];  Service: Gastroenterology;  Laterality: N/A;  . CORONARY ARTERY BYPASS GRAFT  06/14/2011   Procedure: CORONARY ARTERY BYPASS GRAFTING (CABG);  Surgeon: Gaye Pollack, MD;  Location: [place];  Service: Open Heart Surgery;  Laterality: N/A;  Coronary Artery Bypass Graft on pump times six;  utilizing internal mammary artery and right greater saphenous vein harvested endoscopically.x 5 vessels  . ELECTROPHYSIOLOGY STUDY N/A 11/05/2012   Procedure: ELECTROPHYSIOLOGY STUDY;  Surgeon: Deboraha Sprang, MD;  [facility] C[facility] LAB;  Service: Cardiovascular;  Laterality: N/A;  . ESOPHAGOGASTRODUODENOSCOPY (EGD) WITH PROPOFOL N/A 02/17/2013   Procedure: ESOPHAGOGASTRODUODENOSCOPY (EGD) WITH PROPOFOL;  Surgeon: Jerene Bears, MD;  [facility];  Service: Gastroenterology;  Laterality: N/A;  . FEMORAL-POPLITEAL BYPASS GRAFT Left 03/06/2012   Procedure: BYPASS GRAFT FEMORAL-POPLITEAL ARTERY;   Surgeon: Angelia Mould, MD;  Location: [place];  Service: Vascular;  Laterality: Left;  Left Femoral - Below Knee Popliteal Bypass Graft with Vein and Intraoperative Arteriogram.  . INSERT / REPLACE / REMOVE PACEMAKER  2013   /notes 11/05/2012-[employer]  . JOINT REPLACEMENT Left    left TKA  . KNEE ARTHROSCOPY Left    "I had 3" (11/05/2012)  . LEAD REVISION  11/05/2012   pacemaker/notes 11/05/2012  . LEAD REVISION N/A 11/05/2012   Procedure: LEAD REVISION;  Surgeon: Deboraha Sprang, MD;  [facility] C[facility] LAB;  Service: Cardiovascular;  Laterality: N/A;  . MAZE  06/14/2011   Procedure: MAZE;  Surgeon: Gaye Pollack, MD;  Location: [place];  Service: Open Heart Surgery;  Laterality: N/A;  Ligate left atrial appendage  . PERMANENT PACEMAKER INSERTION N/A 06/20/2011   Procedure: PERMANENT PACEMAKER INSERTION;  Surgeon: Deboraha Sprang, MD;  Location: [facility] CATH LAB;  Service: Cardiovascular;  Laterality: N/A;  . TONSILLECTOMY AND ADENOIDECTOMY    . TOTAL KNEE ARTHROPLASTY Left     History  Smoking Status  . Former Smoker  . Packs/day: 3.00  . Years: 32.00  . Types: Cigarettes  . Quit date: 01/16/1979  Smokeless Tobacco  . Never Used    History  Alcohol Use  . 1.2 oz/week  . 2 Glasses of wine per week    Comment: beer 1-2 weekly (occasional)    Family History  Problem Relation Age of Onset  . Coronary artery disease Father   . Hypertension Father   . Heart disease Father     Heart Disease before age 50  .  Heart attack Mother   . Coronary artery disease Mother   . Deep vein thrombosis Mother   . Heart disease Mother     Heart Disease before age 91  . Hyperlipidemia Mother   . Hypertension Mother   . Hypertension Sister   . Heart disease Sister     Heart Disease before age 28  . Hyperlipidemia Sister   . Heart disease Brother     Heart Disease before age 10  . Anesthesia problems Neg Hx   . Diabetes Neg Hx   . Colon cancer Neg Hx     Reviw of Systems:  Reviewed in  the HPI.  All other systems are negative.  Physical Exam: BP (!) 150/90 (BP Location: Right Arm, Patient Position: Sitting, Cuff Size: Normal)   Pulse 90   Ht 5' 10"  (1.778 m)   Wt 231 lb 1.9 oz (104.8 kg)   BMI 33.16 kg/m  The patient is alert and oriented x 3.  The mood and affect are normal.   Skin: warm and dry.      HEENT:   Normocephalic/atraumatic. He has a right carotid endarterectomy scar. His carotids are 2+.  Mild  JVD. His neck is supple. The membranes are moist.  Lungs: Lungs are clear   Heart: Regular rate. No murmurs.  His sternotomy scar is healing well. His thoracostomy sites are healing well.  Abdomen: Good bowel sounds. , NT   Extremities:  1+ bilateral  Edema, dorsalis pedis pulses are 1-2+  equal bilaterally  Toes are dark.    Neuro:  Neuro exam is nonfocal.    ECG:  AV pacing at 90  .   Assessment / Plan:   1. Atrial fibrillation- - s/p MAZE procedure  -  - has   been followed by Dr. Caryl Comes. Continue Eliquis. 2. Hypertension-   blood pressure has been high. . Still eating salt   3. Diabetes mellitus  4. Dyslipidemia- we'll check fasting lipids at his next visit.  5. Peripheral vascular disease:-Status post right carotid endarterectomy and status post PTA by Dr. Sherren Mocha 6. History of GI bleed- doing well on Eliquis 7. Anemia 8. History of TIA 9. Pacer 10 CABG - MAZE - Jun 12, 2011 -   recent episodes of angina. Continue current medications. 11. Fatigue,  Feels cold all the time -  Will check TSH  65./ Chronic diastolic congestive heart failure: He has known chronic diastolic congestive heart failure. He still eats a fair amount of salt. I've advised him to stay away from his salt. He'll take an extra Lasix and potassium tablet for the next several days to see if this helps.  Mertie Moores, MD  12/07/2015 9:57 AM    [place],  [place], [place]  [address] Pager [phone] Phone: [phone]; Fax: [phone]   [name]  [address] [place], [place]  [address] [phone]   Fax [phone]

## 2015-12-07 NOTE — Patient Instructions (Addendum)
Medication Instructions:  Your physician recommends that you continue on your current medications as directed. Please refer to the Current Medication list given to you today.   Labwork: TODAY: CMET / CBC / TSH / LIPID   Testing/Procedures: Your physician has requested that you have an echocardiogram. Echocardiography is a painless test that uses sound waves to create images of your heart. It provides your doctor with information about the size and shape of your heart and how well your heart's chambers and valves are working. This procedure takes approximately one hour. There are no restrictions for this procedure.    Follow-Up: Your physician wants you to follow-up in: 3-4 months with Dr. Acie Fredrickson. You will receive a reminder letter in the mail two months in advance. If you don't receive a letter, please call our office to schedule the follow-up appointment.   Any Other Special Instructions Will Be Listed Below (If Applicable).     If you need a refill on your cardiac medications before your next appointment, please call your pharmacy.

## 2015-12-21 ENCOUNTER — Other Ambulatory Visit: Payer: Self-pay | Admitting: *Deleted

## 2015-12-21 MED ORDER — CARVEDILOL 25 MG PO TABS: ORAL_TABLET | ORAL | 3 refills | Status: DC

## 2016-01-03 ENCOUNTER — Other Ambulatory Visit (HOSPITAL_COMMUNITY): Payer: Federal, State, Local not specified - PPO

## 2016-01-26 ENCOUNTER — Other Ambulatory Visit: Payer: Self-pay

## 2016-01-26 ENCOUNTER — Ambulatory Visit (HOSPITAL_COMMUNITY): Payer: Federal, State, Local not specified - PPO | Attending: Cardiovascular Disease

## 2016-01-26 DIAGNOSIS — I5032 Chronic diastolic (congestive) heart failure: Secondary | ICD-10-CM | POA: Insufficient documentation

## 2016-01-26 DIAGNOSIS — I517 Cardiomegaly: Secondary | ICD-10-CM | POA: Insufficient documentation

## 2016-01-31 ENCOUNTER — Ambulatory Visit (INDEPENDENT_AMBULATORY_CARE_PROVIDER_SITE_OTHER): Payer: Federal, State, Local not specified - PPO | Admitting: *Deleted

## 2016-01-31 ENCOUNTER — Telehealth: Payer: Self-pay | Admitting: Cardiovascular Disease

## 2016-01-31 VITALS — BP 129/76

## 2016-01-31 DIAGNOSIS — I495 Sick sinus syndrome: Secondary | ICD-10-CM | POA: Diagnosis not present

## 2016-01-31 DIAGNOSIS — Z950 Presence of cardiac pacemaker: Secondary | ICD-10-CM | POA: Diagnosis not present

## 2016-01-31 LAB — CUP PACEART INCLINIC DEVICE CHECK
Battery Remaining Longevity: 78 mo
Battery Voltage: 2.77 V
Brady Statistic AS VP Percent: 0 %
Implantable Lead Location: 753859
Implantable Lead Model: 1948
Implantable Pulse Generator Implant Date: 20130605
Lead Channel Impedance Value: 428 Ohm
Lead Channel Pacing Threshold Amplitude: 0.5 V
Lead Channel Pacing Threshold Pulse Width: 0.4 ms
Lead Channel Setting Pacing Amplitude: 2.5 V
Lead Channel Setting Pacing Pulse Width: 0.4 ms
MDC IDC LEAD IMPLANT DT: 20130605
MDC IDC LEAD IMPLANT DT: 20141022
MDC IDC LEAD LOCATION: 753860
MDC IDC LEAD MODEL: 1948
MDC IDC MSMT BATTERY IMPEDANCE: 408 Ohm
MDC IDC MSMT LEADCHNL RV IMPEDANCE VALUE: 500 Ohm
MDC IDC MSMT LEADCHNL RV PACING THRESHOLD AMPLITUDE: 0.75 V
MDC IDC MSMT LEADCHNL RV PACING THRESHOLD PULSEWIDTH: 0.4 ms
MDC IDC MSMT LEADCHNL RV SENSING INTR AMPL: 11.2 mV
MDC IDC SESS DTM: 20180116165646
MDC IDC SET LEADCHNL RA PACING AMPLITUDE: 2 V
MDC IDC SET LEADCHNL RV SENSING SENSITIVITY: 2.8 mV
MDC IDC STAT BRADY AP VP PERCENT: 92 %
MDC IDC STAT BRADY AP VS PERCENT: 8 %
MDC IDC STAT BRADY AS VS PERCENT: 0 %

## 2016-01-31 NOTE — Telephone Encounter (Signed)
Reviewed with Dr. Acie Fredrickson who advised that symptoms do not sound like heart failure.  He advised patient should have device checked since it has been close to 1 year since her device was checked.   I reviewed with nurses in device clinic and they agreed to see patient today at 2:30.  Patient is aware of appointment and is aware that this is for device check only, not with Dr. Caryl Comes. He thanked me for the call.

## 2016-01-31 NOTE — Progress Notes (Signed)
Pacemaker check in clinic for increased fatigue x1 month. Normal device function. RV thresholds, sensing, impedances consistent with previous measurements. No P-waves noted, RA threshold and impedance stable. Device programmed to maximize longevity. No mode switches. 3 high ventricular rates noted--NSVT, longest 5 sec. Device programmed at appropriate safety margins. Histogram distribution appropriate for patient activity level. Device programmed to optimize intrinsic conduction. No changes this session. Estimated longevity 6.5 years. Patient declines [facility]. Patient education completed. ROV with SK as scheduled on 03/20/16.  B/P sitting and standing stable, 125/75 and 129/76.  Spoke with Christen Bame, RN, who scheduled patient for appointment with Dr. Acie Fredrickson on 02/02/16 at 10:00am.  Patient instructed to go to the ED if symptoms worsen acutely before this appointment.  He verbalizes understanding and denies additional questions at this time.

## 2016-01-31 NOTE — Telephone Encounter (Signed)
Spoke with patient's wife to discuss results of echo. She states patient has been having worsening fatigue and SOB. States he gets dizzy every time he stands up. Reports home BP of 790'X mmHg systolic and 83'F mmHg diastolic. Reports she is not aware of patient's pulse but he has a pacemaker.  States he missed most recent appointment with Dr. Caryl Comes and is rescheduled for March. States he takes several naps during the day due to fatigue. I discussed sodium in his diet and she states he has been eating less sodium and is taking medications as directed. Last lab results from 11/17 show Hgb slightly lower than normal and elevated serum Cr. He has not had lab work elsewhere since that time that she is aware of. She states she does not think he shows symptoms of CHF. I advised that I will forward to Dr. Acie Fredrickson for review and call her back later with his advice. She verbalized understanding and agreement with plan.

## 2016-01-31 NOTE — Telephone Encounter (Signed)
New Message  Pts wife voiced needing nurse to contact her.

## 2016-02-02 ENCOUNTER — Ambulatory Visit: Payer: Federal, State, Local not specified - PPO | Admitting: Cardiovascular Disease

## 2016-02-06 ENCOUNTER — Ambulatory Visit (INDEPENDENT_AMBULATORY_CARE_PROVIDER_SITE_OTHER): Payer: Federal, State, Local not specified - PPO | Admitting: Physician Assistant

## 2016-02-06 ENCOUNTER — Encounter: Payer: Self-pay | Admitting: Physician Assistant

## 2016-02-06 ENCOUNTER — Encounter: Payer: Self-pay | Admitting: Cardiovascular Disease

## 2016-02-06 VITALS — BP 126/66 | HR 92 | Ht 70.0 in | Wt 241.0 lb

## 2016-02-06 DIAGNOSIS — I25708 Atherosclerosis of coronary artery bypass graft(s), unspecified, with other forms of angina pectoris: Secondary | ICD-10-CM | POA: Diagnosis not present

## 2016-02-06 DIAGNOSIS — I10 Essential (primary) hypertension: Secondary | ICD-10-CM | POA: Diagnosis not present

## 2016-02-06 DIAGNOSIS — R0602 Shortness of breath: Secondary | ICD-10-CM | POA: Diagnosis not present

## 2016-02-06 DIAGNOSIS — Z79899 Other long term (current) drug therapy: Secondary | ICD-10-CM

## 2016-02-06 DIAGNOSIS — I5033 Acute on chronic diastolic (congestive) heart failure: Secondary | ICD-10-CM

## 2016-02-06 DIAGNOSIS — N183 Chronic kidney disease, stage 3 unspecified: Secondary | ICD-10-CM

## 2016-02-06 LAB — BASIC METABOLIC PANEL
BUN: 20 mg/dL (ref 7–25)
CO2: 20 mmol/L (ref 20–31)
CREATININE: 1.76 mg/dL — AB (ref 0.70–1.18)
Calcium: 9.3 mg/dL (ref 8.6–10.3)
Chloride: 100 mmol/L (ref 98–110)
GLUCOSE: 182 mg/dL — AB (ref 65–99)
Potassium: 4.8 mmol/L (ref 3.5–5.3)
Sodium: 136 mmol/L (ref 135–146)

## 2016-02-06 NOTE — Progress Notes (Signed)
Cardiology Office Note   Date:  02/06/2016   ID:  ISAIH BULGER, DOB 1940/09/13, MRN [id number]  PCP:  Molli Barrows, FNP  Cardiologist:  Dr Acie Fredrickson, 12/07/2015  Rosaria Ferries, PA-C   Chief Complaint  Patient presents with  . Follow-up    patient reports shortness of breath x2 weeks, has excessively increased in the last 2 days. wife states he is short of breath all the time even with rest.    History of Present Illness: Edward [name] is a 76 y.o. male with a history of afib s/p MAZE w/ CABG 05/2011 (LIMA-LAD, SVG-D1, SVG-D2, SVG-CFX, SVG-PDA-PL), DM, HTN, HLD, PAD s/p R-CEA & L fem-pop, TIA, anemia  [name] presents for cardiology evaluation and treatment. He is here today with his wife.  He feels his weight has been steady, 232-236 at home. No recent change. His LE edema is chronic and has not changed recently. It is mostly in the daytime, he does not generally wake with it.  He never gets chest pain. He can climb a flight of stairs, has to do this daily. He does not have to stop before he gets to the top, but is much more DOE than he used to be. He does not get CP w/ the SOB. He has not had a cath since his CABG. His last MV was 2015 and was low risk.  He wheezes a bit, not consistently. He was on an inhaler years ago, not recently. He has been prescribed Tessalon perles and Mucinex, but just started them yesterday. He has been coughing at times, mainly when he takes a deep breath. The cough is nonproductive.  He does not feel his lower extremity edema has changed recently. However, he describes orthopnea and PND. This is in addition to the increased dyspnea on exertion. His oxygen levels were checked with ambulation. He was 94% on room air at rest. He did not go below 90% with ambulation but his respiratory rate did increase and his wheezing increased. At that point, the wheezing seemed mostly upper airway.   Past Medical History:  Diagnosis Date  . Anemia    ?   . Carotid artery occlusion   . Chronic diastolic CHF (congestive heart failure) ([place]) 04/23/2012  . Claudication ([place])   . Coronary artery disease    s/p CABG 2013  . Diabetic neuropathy (HCC)    mostly feet/ legs  . Dyslipidemia   . Exertional shortness of breath   . GERD (gastroesophageal reflux disease)   . GI bleed    while on triple anticoag therapy  . HTN (hypertension)   . Hyperlipidemia   . Neuromuscular disorder (HCC)    neuropathy  . Pacemaker   . PAF (paroxysmal atrial fibrillation) (HCC)    s/p MAZE at time of CABG  . PVD (peripheral vascular disease) ([place])    R CEA 2011, prior PTA, left fem-pop 2014  . Stroke ([place])    vs TIA  . Type II diabetes mellitus ([place])     Past Surgical History:  Procedure Laterality Date  . ABDOMINAL AORTAGRAM N/A 01/07/2012   Procedure: ABDOMINAL Maxcine Ham;  Surgeon: Angelia Mould, MD;  Location[facility] CATH LAB;  Service: Cardiovascular;  Laterality: N/A;  . BALLOON DILATION N/A 02/17/2013   Procedure: BALLOON DILATION;  Surgeon: Jerene Bears, MD;  Location: [facility] ENDOSCOPY;  Service: Gastroenterology;  Laterality: N/A;  . CAROTID ENDARTERECTOMY Right 03/2009  . CHOLECYSTECTOMY    . CIRCUMCISION    .  COLONOSCOPY WITH PROPOFOL N/A 02/17/2013   Procedure: COLONOSCOPY WITH PROPOFOL;  Surgeon: Jerene Bears, MD;  Location: [facility] ENDOSCOPY;  Service: Gastroenterology;  Laterality: N/A;  . CORONARY ARTERY BYPASS GRAFT  06/14/2011   Procedure: CORONARY ARTERY BYPASS GRAFTING (CABG);  Surgeon: Gaye Pollack, MD;  Location: [facility];  Service: Open Heart Surgery;  Laterality: N/A;  Coronary Artery Bypass Graft on pump times six;  utilizing internal mammary artery and right greater saphenous vein harvested endoscopically.x 5 vessels  . ELECTROPHYSIOLOGY STUDY N/A 11/05/2012   Procedure: ELECTROPHYSIOLOGY STUDY;  Surgeon: Deboraha Sprang, MD;  Location: [facility] CATH LAB;  Service: Cardiovascular;  Laterality: N/A;  . ESOPHAGOGASTRODUODENOSCOPY (EGD) WITH  PROPOFOL N/A 02/17/2013   Procedure: ESOPHAGOGASTRODUODENOSCOPY (EGD) WITH PROPOFOL;  Surgeon: Jerene Bears, MD;  Location: [facility] ENDOSCOPY;  Service: Gastroenterology;  Laterality: N/A;  . FEMORAL-POPLITEAL BYPASS GRAFT Left 03/06/2012   Procedure: BYPASS GRAFT FEMORAL-POPLITEAL ARTERY;  Surgeon: Angelia Mould, MD;  Location: [facility];  Service: Vascular;  Laterality: Left;  Left Femoral - Below Knee Popliteal Bypass Graft with Vein and Intraoperative Arteriogram.  . INSERT / REPLACE / REMOVE PACEMAKER  2013   /notes 11/05/2012-[employer]  . JOINT REPLACEMENT Left    left TKA  . KNEE ARTHROSCOPY Left    "I had 3" (11/05/2012)  . LEAD REVISION  11/05/2012   pacemaker/notes 11/05/2012  . LEAD REVISION N/A 11/05/2012   Procedure: LEAD REVISION;  Surgeon: Deboraha Sprang, MD;  Location: [facility] CATH LAB;  Service: Cardiovascular;  Laterality: N/A;  . MAZE  06/14/2011   Procedure: MAZE;  Surgeon: Gaye Pollack, MD;  Location: [place];  Service: Open Heart Surgery;  Laterality: N/A;  [place] left atrial appendage  . PERMANENT PACEMAKER INSERTION N/A 06/20/2011   Procedure: PERMANENT PACEMAKER INSERTION;  Surgeon: Deboraha Sprang, MD;  Location: [facility] CATH LAB;  Service: Cardiovascular;  Laterality: N/A;  . TONSILLECTOMY AND ADENOIDECTOMY    . TOTAL KNEE ARTHROPLASTY Left     Current Outpatient Prescriptions  Medication Sig Dispense Refill  . acetaminophen (TYLENOL) 500 MG tablet Take 500 mg by mouth every 6 (six) hours as needed for mild pain or moderate pain.     Marland Kitchen apixaban (ELIQUIS) 5 MG TABS tablet Take 1 tablet (5 mg total) by mouth 2 (two) times daily. 180 tablet 3  . benzonatate (TESSALON) 100 MG capsule Take 100 mg by mouth 4 (four) times daily as needed for cough.    . carvedilol (COREG) 25 MG tablet TAKE 1 TABLET (25 MG TOTAL) BY MOUTH 2 (TWO) TIMES DAILY. 180 tablet 3  . diphenhydramine-acetaminophen (TYLENOL PM) 25-500 MG TABS Take 1 tablet by mouth at bedtime as needed (sleep).     . famotidine  (PEPCID) 20 MG tablet Take 20 mg by mouth daily.    . furosemide (LASIX) 40 MG tablet Take 2 tablets (80 mg total) by mouth daily. 180 tablet 2  . glipiZIDE (GLUCOTROL) 5 MG tablet Take 5 mg by mouth 3 (three) times daily.     Marland Kitchen guaiFENesin (ROBITUSSIN) 100 MG/5ML SOLN Take 5 mLs by mouth every 4 (four) hours as needed for cough or to loosen phlegm.    . Iron Combinations (IRON COMPLEX PO) Take 2 capsules by mouth daily.    Marland Kitchen KLOR-CON M20 20 MEQ tablet TAKE 1 TABLET BY MOUTH EVERY DAY 90 tablet 3  . L-Methylfolate-Algae-B12-B6 (METANX) 3-90.314-2-35 MG CAPS TAKE ONE CAPSULE BY MOUTH EVERY DAY 15 capsule 0  . liraglutide (VICTOZA) 18 MG/3ML SOPN  Inject 18 mg into the skin daily.    Marland Kitchen loratadine (CLARITIN) 10 MG tablet Take 10 mg by mouth daily.    Marland Kitchen losartan (COZAAR) 100 MG tablet TAKE 1 TABLET BY MOUTH EVERY DAY 90 tablet 3  . Magnesium Oxide 420 MG TABS Take 420 mg by mouth daily.    . metFORMIN (GLUCOPHAGE) 1000 MG tablet Take 1,000 mg by mouth 2 (two) times daily with a meal.    . Multiple Vitamin (MULTIVITAMIN WITH MINERALS) TABS Take 1 tablet by mouth daily.    Marland Kitchen NOVOTWIST 32G X 5 MM MISC     . omeprazole (PRILOSEC) 40 MG capsule TAKE 1 CAPSULE 30 TO 60 MINUTES BEFORE YOUR FIRST AND LAST MEALS OF THE DAY 60 capsule 2  . ONETOUCH DELICA LANCETS FINE MISC 1 each by other route daily. 1/day 250.62 100 each 3  . ONETOUCH VERIO test strip     . pravastatin (PRAVACHOL) 80 MG tablet TAKE 1 TABLET (80 MG TOTAL) BY MOUTH DAILY. 90 tablet 3  . triamcinolone cream (KENALOG) 0.1 % Apply 1 application topically as needed (rash).     Marland Kitchen amLODipine (NORVASC) 5 MG tablet Take 1 tablet (5 mg total) by mouth daily. 90 tablet 3   No current facility-administered medications for this visit.     Allergies:   Lipitor [atorvastatin calcium]    Social History:  The patient  reports that he quit smoking about 37 years ago. His smoking use included Cigarettes. He has a 96.00 pack-year smoking history. He has  never used smokeless tobacco. He reports that he drinks about 1.2 oz of alcohol per week . He reports that he does not use drugs.   Family History:  The patient's family history includes Coronary artery disease in his father and mother; Deep vein thrombosis in his mother; Heart attack in his mother; Heart disease in his brother, father, mother, and sister; Hyperlipidemia in his mother and sister; Hypertension in his father, mother, and sister.    ROS:  Please see the history of present illness. All other systems are reviewed and negative.    PHYSICAL EXAM: VS:  BP 126/66   Pulse 92   Ht 5' 10"  (1.778 m)   Wt 241 lb (109.3 kg)   BMI 34.58 kg/m  , BMI Body mass index is 34.58 kg/m. GEN: Well nourished, well developed, male in no acute distress  HEENT: normal for age  Neck: JVD9 cm, +HJR, no carotid bruit, no masses Cardiac: RRR; 2/6 murmur, no rubs, or gallops Respiratory: Decreased breath sounds bases bilaterally, some upper airway wheezing and slight and-expiratory wheeze noted, normal work of breathing GI: soft, nontender, nondistended, + BS MS: no deformity or atrophy; 1+ tender edema; distal pulses are 2+ in all 4 extremities   Skin: warm and dry, no rash Neuro:  Strength and sensation are intact Psych: euthymic mood, full affect   EKG:  EKG is ordered today. The ekg ordered today demonstrates AV pacing (per recent interrogation, pt is AP-VP 92% of the time)  ECHO: 01/26/2016 - Left ventricle: The cavity size was normal. Wall thickness was   increased in a pattern of moderate LVH. There was focal basal   hypertrophy. Systolic function was normal. The estimated ejection   fraction was in the range of 50% to 55%. - Mitral valve: Moderately calcified annulus. - Left atrium: The atrium was mildly dilated. - Right atrium: The atrium was moderately to severely dilated.  MYOVIEW: 02/25/2013 Overall Impression:  This is a  low risk scan with no significant change since January,  2014. There is a small/moderate area of scar at the apex. This has been noted in the past. There is no ischemia. LV Ejection Fraction: 53%.  LV Wall Motion:  Mild hypokinesis at the apex.  Recent Labs: 12/07/2015: ALT 17; BUN 19; Creat 1.56; Hemoglobin 12.6; Platelets 214; Potassium 4.1; Sodium 139; TSH 3.28    Lipid Panel    Component Value Date/Time   CHOL 101 12/07/2015 1034   TRIG 142 12/07/2015 1034   HDL 31 (L) 12/07/2015 1034   CHOLHDL 3.3 12/07/2015 1034   VLDL 28 12/07/2015 1034   LDLCALC 42 12/07/2015 1034     Wt Readings from Last 3 Encounters:  02/06/16 241 lb (109.3 kg)  12/07/15 231 lb 1.9 oz (104.8 kg)  10/20/15 231 lb 12.8 oz (105.1 kg)     Other studies Reviewed: Additional studies/ records that were reviewed today include: Office notes, hospital records and testing.  ASSESSMENT AND PLAN:  1.  Dyspnea on exertion: He may have a component of underlying baseline COPD as well as upper airway wheeze. However, he has volume overload by exam. His weight is up 10 pounds on our scales. He does not believe his weight is up that much on his home scales, but his weights going back over here have been consistently 6-15 pounds less than his weight is now. He is currently taking Lasix 80 mg daily. We will increase this to 80 mg twice a day for the next 3 days and resume home dose. He will get a BMET and BNP today. He will follow-up in one week at the [facility] office. Recheck weight, volume status and reassess his renal function at that time.  2. CAD: He has a history of bypass surgery 5 years ago, last stress test 3 years ago. If his dyspnea on exertion does not significantly improve with diuresis, he will need a right and left heart cath to help define this. However, his shortness of breath is too severe for that at this time.  3. CKD stage III: BUN and creatinine were 19/1.56 and November 2017. This is consistent with labs going back a year or more. Check today and recheck  after diuresis.  4. Wheezing: I offered the patient inhaler but advised him the ones I was thinking about using were very expensive. I suggested we defer this to his PCP. If his wheezing does not improve with diuresis, follow up with PCP. The patient agrees with this.   Current medicines are reviewed at length with the patient today.  The patient does not have concerns regarding medicines.  The following changes have been made:  Increase Lasix and potassium  Labs/ tests ordered today include:   Orders Placed This Encounter  Procedures  . Brain natriuretic peptide  . Basic Metabolic Panel (BMET)  . EKG 12-Lead     Disposition:   FU with Dr. Acie Fredrickson  Signed, Rosaria Ferries, PA-C  02/06/2016 2:04 PM    [place] Phone: [phone]; Fax: [phone]  This note was written with the assistance of speech recognition software. Please excuse any transcriptional errors.

## 2016-02-06 NOTE — Patient Instructions (Addendum)
Medication Instructions:  INCREASE Lasix to 59m (2 tablets) two times a day for the next 3 days, then resume regular dose. INCREASE Potassium to 258m(1 tablet) two times a day for the next 3 days, then resume regular dose.   LIMIT fluid intake to 2 Liters a day.  Labwork: Have lab work completed TODAY (BNP, BMET).  Testing/Procedures: NONE  Follow-Up: Follow up with APP or MD in 1 WEEK.      If you need a refill on your cardiac medications before your next appointment, please call your pharmacy.

## 2016-02-06 NOTE — Telephone Encounter (Signed)
I spoke with patient by telephone. Pt states he has been short of breath all week, pt states he was unable to sleep last night because of shortness of breath. Pt denies any other symptoms, including chest pain or lower extremity edema. I reviewed with Dr Pollie Friar Dr Acie Fredrickson schedule pt to see him today at 1:30PM, pt will probably need to be scheduled for cardiac cath.  Pt states he cannot come at 1:30PM, his wife is having surgery today and is due at the [facility] at 12:15PM.  Pt requesting appt this morning, pt has been scheduled to see Rosaria Ferries, PA today at [place], patient and  Dr Acie Fredrickson aware.

## 2016-02-07 LAB — BRAIN NATRIURETIC PEPTIDE: Brain Natriuretic Peptide: 120.2 pg/mL — ABNORMAL HIGH (ref <100)

## 2016-02-08 ENCOUNTER — Other Ambulatory Visit: Payer: Self-pay | Admitting: Cardiovascular Disease

## 2016-02-08 NOTE — Telephone Encounter (Signed)
Rx refill sent to pharmacy. 

## 2016-02-14 NOTE — Progress Notes (Signed)
CARDIOLOGY OFFICE NOTE  Date:  02/15/2016    Edward [name] Date of Birth: 1940-05-03 Medical Record #[id number]  PCP:  Molli Barrows, FNP  Cardiologist:  Nahser   Chief Complaint  Patient presents with  . Shortness of Breath    Follow up visit - seen for Dr. Acie Fredrickson    History of Present Illness: Edward [name] is a 76 y.o. male who presents today for a follow up visit. Seen for Dr. Acie Fredrickson.   He has a history of afib s/p MAZE w/ CABG 05/2011 (LIMA-LAD, SVG-D1, SVG-D2, SVG-CFX, SVG-PDA-PL), DM, HTN, HLD, PAD s/p R-CEA & L fem-pop, TIA, & anemia. He has PPM in place - followed by Dr. Caryl Comes.   Last seen back in November by Dr. Acie Fredrickson. Noted some fatigue. Noted he likes his salt.   Seen about a week ago as a work in by [employer], [employer] for shortness of breath and wheezing. There was felt to be some component of volume overload - his diuretics were increased for a few days. He was to come back to [place] for further follow up.   Comes in today. Here with his wife. He is quite abrasive. Very hard to get any history out of him. Will not look at me. Hard to say if he is better. Weight is down. Seems more bothered by cough. Has been to the [facility] noted. Given inhaler. Cough not productive. Has chest discomfort - sounds like it is from coughing but again - he really does not wish to give me any history.   Spoke with Dr. Acie Fredrickson here in the office - apparently this patient is not to see male providers due to past behavior.   Past Medical History:  Diagnosis Date  . Anemia    ?  . Carotid artery occlusion   . Chronic diastolic CHF (congestive heart failure) ([place]) 04/23/2012  . Claudication ([place])   . Coronary artery disease    s/p CABG 2013  . Diabetic neuropathy (HCC)    mostly feet/ legs  . Dyslipidemia   . Exertional shortness of breath   . GERD (gastroesophageal reflux disease)   . GI bleed    while on triple anticoag therapy  . HTN (hypertension)   .  Hyperlipidemia   . Neuromuscular disorder (HCC)    neuropathy  . Pacemaker   . PAF (paroxysmal atrial fibrillation) (HCC)    s/p MAZE at time of CABG  . PVD (peripheral vascular disease) ([place])    R CEA 2011, prior PTA, left fem-pop 2014  . Stroke ([place])    vs TIA  . Type II diabetes mellitus ([place])     Past Surgical History:  Procedure Laterality Date  . ABDOMINAL AORTAGRAM N/A 01/07/2012   Procedure: ABDOMINAL Maxcine Ham;  Surgeon: Angelia Mould, MD;  Location: [facility] CATH LAB;  Service: Cardiovascular;  Laterality: N/A;  . BALLOON DILATION N/A 02/17/2013   Procedure: BALLOON DILATION;  Surgeon: Jerene Bears, MD;  Location: [facility] ENDOSCOPY;  Service: Gastroenterology;  Laterality: N/A;  . CAROTID ENDARTERECTOMY Right 03/2009  . CHOLECYSTECTOMY    . CIRCUMCISION    . COLONOSCOPY WITH PROPOFOL N/A 02/17/2013   Procedure: COLONOSCOPY WITH PROPOFOL;  Surgeon: Jerene Bears, MD;  Location: [facility] ENDOSCOPY;  Service: Gastroenterology;  Laterality: N/A;  . CORONARY ARTERY BYPASS GRAFT  06/14/2011   Procedure: CORONARY ARTERY BYPASS GRAFTING (CABG);  Surgeon: Gaye Pollack, MD;  Location: [facility];  Service: Open Heart Surgery;  Laterality: N/A;  Coronary Artery Bypass Graft on pump times six;  utilizing internal mammary artery and right greater saphenous vein harvested endoscopically.x 5 vessels  . ELECTROPHYSIOLOGY STUDY N/A 11/05/2012   Procedure: ELECTROPHYSIOLOGY STUDY;  Surgeon: Deboraha Sprang, MD;  Location: [facility] CATH LAB;  Service: Cardiovascular;  Laterality: N/A;  . ESOPHAGOGASTRODUODENOSCOPY (EGD) WITH PROPOFOL N/A 02/17/2013   Procedure: ESOPHAGOGASTRODUODENOSCOPY (EGD) WITH PROPOFOL;  Surgeon: Jerene Bears, MD;  Location: [facility] ENDOSCOPY;  Service: Gastroenterology;  Laterality: N/A;  . FEMORAL-POPLITEAL BYPASS GRAFT Left 03/06/2012   Procedure: BYPASS GRAFT FEMORAL-POPLITEAL ARTERY;  Surgeon: Angelia Mould, MD;  Location: [facility];  Service: Vascular;  Laterality: Left;  Left Femoral - Below  Knee Popliteal Bypass Graft with Vein and Intraoperative Arteriogram.  . INSERT / REPLACE / REMOVE PACEMAKER  2013   /notes 11/05/2012-[employer]  . JOINT REPLACEMENT Left    left TKA  . KNEE ARTHROSCOPY Left    "I had 3" (11/05/2012)  . LEAD REVISION  11/05/2012   pacemaker/notes 11/05/2012  . LEAD REVISION N/A 11/05/2012   Procedure: LEAD REVISION;  Surgeon: Deboraha Sprang, MD;  Location: [facility] CATH LAB;  Service: Cardiovascular;  Laterality: N/A;  . MAZE  06/14/2011   Procedure: MAZE;  Surgeon: Gaye Pollack, MD;  Location: [facility];  Service: Open Heart Surgery;  Laterality: N/A;  [place]igate left atrial appendage  . PERMANENT PACEMAKER INSERTION N/A 06/20/2011   Procedure: PERMANENT PACEMAKER INSERTION;  Surgeon: Deboraha Sprang, MD;  Location: [facility] CATH LAB;  Service: Cardiovascular;  Laterality: N/A;  . TONSILLECTOMY AND ADENOIDECTOMY    . TOTAL KNEE ARTHROPLASTY Left      Medications: Current Outpatient Prescriptions  Medication Sig Dispense Refill  . acetaminophen (TYLENOL) 500 MG tablet Take 500 mg by mouth every 6 (six) hours as needed for mild pain or moderate pain.     Marland Kitchen amLODipine (NORVASC) 5 MG tablet Take 1 tablet (5 mg total) by mouth daily. 90 tablet 3  . apixaban (ELIQUIS) 5 MG TABS tablet Take 1 tablet (5 mg total) by mouth 2 (two) times daily. 180 tablet 3  . benzonatate (TESSALON) 100 MG capsule Take 100 mg by mouth 4 (four) times daily as needed for cough.    . carvedilol (COREG) 25 MG tablet TAKE 1 TABLET (25 MG TOTAL) BY MOUTH 2 (TWO) TIMES DAILY. 180 tablet 3  . diphenhydramine-acetaminophen (TYLENOL PM) 25-500 MG TABS Take 1 tablet by mouth at bedtime as needed (sleep).     . famotidine (PEPCID) 20 MG tablet Take 20 mg by mouth daily.    . furosemide (LASIX) 40 MG tablet TAKE 2 TABLETS BY MOUTH EVERY DAY 180 tablet 2  . glipiZIDE (GLUCOTROL) 5 MG tablet Take 5 mg by mouth 3 (three) times daily.     Marland Kitchen guaiFENesin (ROBITUSSIN) 100 MG/5ML SOLN Take 5 mLs by mouth every 4 (four)  hours as needed for cough or to loosen phlegm.    . Iron Combinations (IRON COMPLEX PO) Take 2 capsules by mouth daily.    Marland Kitchen KLOR-CON M20 20 MEQ tablet TAKE 1 TABLET BY MOUTH EVERY DAY 90 tablet 3  . L-Methylfolate-Algae-B12-B6 (METANX) 3-90.314-2-35 MG CAPS TAKE ONE CAPSULE BY MOUTH EVERY DAY 15 capsule 0  . liraglutide (VICTOZA) 18 MG/3ML SOPN Inject 18 mg into the skin daily.    Marland Kitchen loratadine (CLARITIN) 10 MG tablet Take 10 mg by mouth daily.    Marland Kitchen losartan (COZAAR) 100 MG tablet TAKE 1 TABLET BY MOUTH EVERY DAY 90 tablet  3  . Magnesium Oxide 420 MG TABS Take 420 mg by mouth daily.    . metFORMIN (GLUCOPHAGE) 1000 MG tablet Take 1,000 mg by mouth 2 (two) times daily with a meal.    . Multiple Vitamin (MULTIVITAMIN WITH MINERALS) TABS Take 1 tablet by mouth daily.    Marland Kitchen NOVOTWIST 32G X 5 MM MISC     . omeprazole (PRILOSEC) 40 MG capsule TAKE 1 CAPSULE 30 TO 60 MINUTES BEFORE YOUR FIRST AND LAST MEALS OF THE DAY 60 capsule 2  . ONETOUCH DELICA LANCETS FINE MISC 1 each by other route daily. 1/day 250.62 100 each 3  . ONETOUCH VERIO test strip     . pravastatin (PRAVACHOL) 80 MG tablet TAKE 1 TABLET (80 MG TOTAL) BY MOUTH DAILY. 90 tablet 3  . triamcinolone cream (KENALOG) 0.1 % Apply 1 application topically as needed (rash).      No current facility-administered medications for this visit.     Allergies: Allergies  Allergen Reactions  . Lipitor [Atorvastatin Calcium] Other (See Comments)    Reaction unknown    Social History: The patient  reports that he quit smoking about 37 years ago. His smoking use included Cigarettes. He has a 96.00 pack-year smoking history. He has never used smokeless tobacco. He reports that he drinks about 1.2 oz of alcohol per week . He reports that he does not use drugs.   Family History: The patient's family history includes Coronary artery disease in his father and mother; Deep vein thrombosis in his mother; Heart attack in his mother; Heart disease in his  brother, father, mother, and sister; Hyperlipidemia in his mother and sister; Hypertension in his father, mother, and sister.   Review of Systems: Please see the history of present illness.   Otherwise, the review of systems is positive for none.   All other systems are reviewed and negative.   Physical Exam: VS:  BP 110/70   Pulse 88   Ht 5' 10"  (1.778 m)   Wt 224 lb 1.9 oz (101.7 kg)   BMI 32.16 kg/m  .  BMI Body mass index is 32.16 kg/m.  Wt Readings from Last 3 Encounters:  02/15/16 224 lb 1.9 oz (101.7 kg)  02/06/16 241 lb (109.3 kg)  12/07/15 231 lb 1.9 oz (104.8 kg)    General: He is alert and in no acute distress.  Weight is down 17 pounds. Very abrasive verbally and non verbally. Shaking his hands at me. Does not wish for me to touch him.  HEENT: Normal.  Neck: Supple, no JVD, carotid bruits, or masses noted.  Cardiac: Regular rate and rhythm.  Respiratory:  Lungs are clear to auscultation bilaterally with normal work of breathing.  GI: Soft and nontender.  MS: No deformity or atrophy. Gait and ROM intact.  Skin: Warm and dry. Color is sallow Neuro:  Strength and sensation are intact and no gross focal deficits noted.  Psych: Alert, appropriate and with normal affect.   LABORATORY DATA:  EKG:  EKG is not ordered today.  Lab Results  Component Value Date   WBC 9.5 12/07/2015   HGB 12.6 (L) 12/07/2015   HCT 39.3 12/07/2015   PLT 214 12/07/2015   GLUCOSE 182 (H) 02/06/2016   CHOL 101 12/07/2015   TRIG 142 12/07/2015   HDL 31 (L) 12/07/2015   LDLCALC 42 12/07/2015   ALT 17 12/07/2015   AST 25 12/07/2015   NA 136 02/06/2016   K 4.8 02/06/2016   CL 100  02/06/2016   CREATININE 1.76 (H) 02/06/2016   BUN 20 02/06/2016   CO2 20 02/06/2016   TSH 3.28 12/07/2015   PSA 0.59 09/20/2012   INR 0.94 02/29/2012   HGBA1C 6.9 (H) 09/22/2013   MICROALBUR 56.2 (H) 07/08/2013    BNP (last 3 results)  Recent Labs  02/06/16 1124  BNP 120.2*    ProBNP (last 3  results) No results for input(s): PROBNP in the last 8760 hours.   Other Studies Reviewed Today:  ECHO: 01/26/2016 - Left ventricle: The cavity size was normal. Wall thickness was increased in a pattern of moderate LVH. There was focal basal hypertrophy. Systolic function was normal. The estimated ejection fraction was in the range of 50% to 55%. - Mitral valve: Moderately calcified annulus. - Left atrium: The atrium was mildly dilated. - Right atrium: The atrium was moderately to severely dilated.  MYOVIEW: 02/25/2013 Overall Impression: This is a low risk scan with no significant change since January, 2014. There is a small/moderate area of scar at the apex. This has been noted in the past. There is no ischemia. LV Ejection Fraction: 53%. LV Wall Motion: Mild hypokinesis at the apex.  Assessment/Plan:  1.  Dyspnea on exertion: He was felt to have a component of underlying baseline COPD as well as upper airway wheeze and volume overload by exam. Probably has some degree of diastolic HF. Recent echo noted. His weight is down. Needs repeat lab today. Will have Dr. Acie Fredrickson to see this morning. I am not able to elicit a history and carry out this visit today.   2. CAD: He has a history of bypass surgery 5 years ago - last stress test 3 years ago. May need to consider Myoview before arranging cardiac cath due to CKD.   3. CKD stage III:   4. Wheezing: has seen the [place] and was given an inhaler.   5. PAF - has underlying PPM in place - on chronic anticoagulation -    Current medicines are reviewed with the patient today.  The patient does not have concerns regarding medicines other than what has been noted above.  The following changes have been made:  See above.  Labs/ tests ordered today include:   No orders of the defined types were placed in this encounter.    Disposition:   Patient to be seen by Dr. Acie Fredrickson.    Patient is agreeable to this plan and will call if  any problems develop in the interim.   SignedTruitt Merle, NP  02/15/2016 9:32 AM  [facility] [address] [place], [place]  [address] Phone: [phone] Fax: [phone]

## 2016-02-15 ENCOUNTER — Ambulatory Visit (INDEPENDENT_AMBULATORY_CARE_PROVIDER_SITE_OTHER): Payer: Self-pay | Admitting: Nurse Practitioner

## 2016-02-15 ENCOUNTER — Encounter: Payer: Self-pay | Admitting: Nurse Practitioner

## 2016-02-15 ENCOUNTER — Ambulatory Visit (INDEPENDENT_AMBULATORY_CARE_PROVIDER_SITE_OTHER): Payer: Federal, State, Local not specified - PPO | Admitting: Cardiovascular Disease

## 2016-02-15 VITALS — BP 110/70 | HR 88 | Ht 70.0 in | Wt 224.1 lb

## 2016-02-15 DIAGNOSIS — I5033 Acute on chronic diastolic (congestive) heart failure: Secondary | ICD-10-CM

## 2016-02-15 DIAGNOSIS — E785 Hyperlipidemia, unspecified: Secondary | ICD-10-CM

## 2016-02-15 DIAGNOSIS — I25708 Atherosclerosis of coronary artery bypass graft(s), unspecified, with other forms of angina pectoris: Secondary | ICD-10-CM | POA: Diagnosis not present

## 2016-02-15 DIAGNOSIS — I5032 Chronic diastolic (congestive) heart failure: Secondary | ICD-10-CM | POA: Diagnosis not present

## 2016-02-15 DIAGNOSIS — R0600 Dyspnea, unspecified: Secondary | ICD-10-CM

## 2016-02-15 DIAGNOSIS — R05 Cough: Secondary | ICD-10-CM

## 2016-02-15 DIAGNOSIS — R059 Cough, unspecified: Secondary | ICD-10-CM | POA: Insufficient documentation

## 2016-02-15 DIAGNOSIS — I480 Paroxysmal atrial fibrillation: Secondary | ICD-10-CM

## 2016-02-15 DIAGNOSIS — Z950 Presence of cardiac pacemaker: Secondary | ICD-10-CM

## 2016-02-15 DIAGNOSIS — I10 Essential (primary) hypertension: Secondary | ICD-10-CM

## 2016-02-15 NOTE — Patient Instructions (Signed)
Medication Instructions:  Your physician recommends that you continue on your current medications as directed. Please refer to the Current Medication list given to you today.   Labwork: Your physician recommends that you have lab work today: bmet   Testing/Procedures: -None  Follow-Up: Your physician recommends that you keep your scheduled  follow-up appointment in: 3 months with Dr. Acie Fredrickson You have been referred to Pulmonary, Not Dr. Melvyn Novas.  Someone should be calling you to Schedule appointment.   Any Other Special Instructions Will Be Listed Below (If Applicable).     If you need a refill on your cardiac medications before your next appointment, please call your pharmacy.

## 2016-02-15 NOTE — Patient Instructions (Signed)
We will be checking the following labs today -    Medication Instructions:    Continue with your current medicines.     Testing/Procedures To Be Arranged:  N/A  Follow-Up:   See     Other Special Instructions:   N/A    If you need a refill on your cardiac medications before your next appointment, please call your pharmacy.   Call the [place] office at [phone] if you have any questions, problems or concerns.

## 2016-02-15 NOTE — Progress Notes (Signed)
CARDIOLOGY OFFICE NOTE  Date:  02/15/2016    Edward [name] Date of Birth: 1940/06/12 Medical Record #[id number]  PCP:  Molli Barrows, FNP    Cardiologist:  Taralyn Ferraiolo       Chief Complaint  Patient presents with  . Shortness of Breath    Follow up visit - seen for Dr. Acie Fredrickson    History of Present Illness: Edward [name] is a 76 y.o. male who presents today for a follow up visit. Seen for Dr. Acie Fredrickson.   He has a history of afib s/p MAZE w/ CABG 05/2011 (LIMA-LAD, SVG-D1, SVG-D2, SVG-CFX, SVG-PDA-PL), DM, HTN, HLD, PAD s/p R-CEA &L fem-pop, TIA, & anemia. He has PPM in place - followed by Dr. Caryl Comes.   Last seen back in November by Dr. Acie Fredrickson. Noted some fatigue. Noted he likes his salt.   Seen about a week ago as a work in by [employer], [employer] for shortness of breath and wheezing. There was felt to be some component of volume overload - his diuretics were increased for a few days. He was to come back to [place] for further follow up.   Comes in today. Here with his wife. He is quite abrasive. Very hard to get any history out of him. Will not look at me. Hard to say if he is better. Weight is down. Seems more bothered by cough. Has been to the [facility] noted. Given inhaler. Cough not productive. Has chest discomfort - sounds like it is from coughing but again - he really does not wish to give me any history.   Spoke with Dr. Acie Fredrickson here in the office - apparently this patient is not to see male providers due to past behavior.   [name]. 31, 2018  I was asked to see [name] today on an urgent basis.  He was seen by Cecille Rubin.    She asked me to see him because he was being belligerent with her .  Has lost 17 lbs with lasix 40 BID . Still complains of cough.   CT of chest in March 2017 showed small pleural effusion .  Mild COPD,  Pulmonary art. Enlargement .  Coughing up brown sputum.  Advised him to see pulmonary  - he wants to ask the [facility] to send him to the [place]  pulmonologist.       Past Medical History:  Diagnosis Date  . Anemia    ?  . Carotid artery occlusion   . Chronic diastolic CHF (congestive heart failure) ([place]) 04/23/2012  . Claudication ([place])   . Coronary artery disease    s/p CABG 2013  . Diabetic neuropathy (HCC)    mostly feet/ legs  . Dyslipidemia   . Exertional shortness of breath   . GERD (gastroesophageal reflux disease)   . GI bleed    while on triple anticoag therapy  . HTN (hypertension)   . Hyperlipidemia   . Neuromuscular disorder (HCC)    neuropathy  . Pacemaker   . PAF (paroxysmal atrial fibrillation) (HCC)    s/p MAZE at time of CABG  . PVD (peripheral vascular disease) ([place])    R CEA 2011, prior PTA, left fem-pop 2014  . Stroke ([place])    vs TIA  . Type II diabetes mellitus ([place])          Past Surgical History:  Procedure Laterality Date  . ABDOMINAL AORTAGRAM N/A 01/07/2012   Procedure: ABDOMINAL Maxcine Ham;  Surgeon: Angelia Mould, MD;  Location: [facility] CATH LAB;  Service: Cardiovascular;  Laterality: N/A;  . BALLOON DILATION N/A 02/17/2013   Procedure: BALLOON DILATION;  Surgeon: Jerene Bears, MD;  Location: [facility] ENDOSCOPY;  Service: Gastroenterology;  Laterality: N/A;  . CAROTID ENDARTERECTOMY Right 03/2009  . CHOLECYSTECTOMY    . CIRCUMCISION    . COLONOSCOPY WITH PROPOFOL N/A 02/17/2013   Procedure: COLONOSCOPY WITH PROPOFOL;  Surgeon: Jerene Bears, MD;  Location: [facility];  Service: Gastroenterology;  Laterality: N/A;  . CORONARY ARTERY BYPASS GRAFT  06/14/2011   Procedure: CORONARY ARTERY BYPASS GRAFTING (CABG);  Surgeon: Gaye Pollack, MD;  Location: [facility];  Service: Open Heart Surgery;  Laterality: N/A;  Coronary Artery Bypass Graft on pump times six;  utilizing internal mammary artery and right greater saphenous vein harvested endoscopically.x 5 vessels  . ELECTROPHYSIOLOGY STUDY N/A 11/05/2012   Procedure: ELECTROPHYSIOLOGY STUDY;  Surgeon: Deboraha Sprang, MD;  Location: [facility] CATH LAB;  Service: Cardiovascular;  Laterality: N/A;  . ESOPHAGOGASTRODUODENOSCOPY (EGD) WITH PROPOFOL N/A 02/17/2013   Procedure: ESOPHAGOGASTRODUODENOSCOPY (EGD) WITH PROPOFOL;  Surgeon: Jerene Bears, MD;  Location: [facility];  Service: Gastroenterology;  Laterality: N/A;  . FEMORAL-POPLITEAL BYPASS GRAFT Left 03/06/2012   Procedure: BYPASS GRAFT FEMORAL-POPLITEAL ARTERY;  Surgeon: Angelia Mould, MD;  Location: [facility];  Service: Vascular;  Laterality: Left;  Left Femoral - Below Knee Popliteal Bypass Graft with Vein and Intraoperative Arteriogram.  . INSERT / REPLACE / REMOVE PACEMAKER  2013   /notes 11/05/2012-[employer]  . JOINT REPLACEMENT Left    left TKA  . KNEE ARTHROSCOPY Left    "I had 3" (11/05/2012)  . LEAD REVISION  11/05/2012   pacemaker/notes 11/05/2012  . LEAD REVISION N/A 11/05/2012   Procedure: LEAD REVISION;  Surgeon: Deboraha Sprang, MD;  Location[facility] CATH LAB;  Service: Cardiovascular;  Laterality: N/A;  . MAZE  06/14/2011   Procedure: MAZE;  Surgeon: Gaye Pollack, MD;  Location: [facility];  Service: Open Heart Surgery;  Laterality: N/A;  Ligate left atrial appendage  . PERMANENT PACEMAKER INSERTION N/A 06/20/2011   Procedure: PERMANENT PACEMAKER INSERTION;  Surgeon: Deboraha Sprang, MD;  Location: [facility] CATH LAB;  Service: Cardiovascular;  Laterality: N/A;  . TONSILLECTOMY AND ADENOIDECTOMY    . TOTAL KNEE ARTHROPLASTY Left      Medications:       Current Outpatient Prescriptions  Medication Sig Dispense Refill  . acetaminophen (TYLENOL) 500 MG tablet Take 500 mg by mouth every 6 (six) hours as needed for mild pain or moderate pain.     Marland Kitchen amLODipine (NORVASC) 5 MG tablet Take 1 tablet (5 mg total) by mouth daily. 90 tablet 3  . apixaban (ELIQUIS) 5 MG TABS tablet Take 1 tablet (5 mg total) by mouth 2 (two) times daily. 180 tablet 3  . benzonatate (TESSALON) 100 MG capsule Take 100 mg by mouth 4 (four) times daily as  needed for cough.    . carvedilol (COREG) 25 MG tablet TAKE 1 TABLET (25 MG TOTAL) BY MOUTH 2 (TWO) TIMES DAILY. 180 tablet 3  . diphenhydramine-acetaminophen (TYLENOL PM) 25-500 MG TABS Take 1 tablet by mouth at bedtime as needed (sleep).     . famotidine (PEPCID) 20 MG tablet Take 20 mg by mouth daily.    . furosemide (LASIX) 40 MG tablet TAKE 2 TABLETS BY MOUTH EVERY DAY 180 tablet 2  . glipiZIDE (GLUCOTROL) 5 MG tablet Take 5 mg by mouth 3 (three) times daily.     Marland Kitchen  guaiFENesin (ROBITUSSIN) 100 MG/5ML SOLN Take 5 mLs by mouth every 4 (four) hours as needed for cough or to loosen phlegm.    . Iron Combinations (IRON COMPLEX PO) Take 2 capsules by mouth daily.    Marland Kitchen KLOR-CON M20 20 MEQ tablet TAKE 1 TABLET BY MOUTH EVERY DAY 90 tablet 3  . L-Methylfolate-Algae-B12-B6 (METANX) 3-90.314-2-35 MG CAPS TAKE ONE CAPSULE BY MOUTH EVERY DAY 15 capsule 0  . liraglutide (VICTOZA) 18 MG/3ML SOPN Inject 18 mg into the skin daily.    Marland Kitchen loratadine (CLARITIN) 10 MG tablet Take 10 mg by mouth daily.    Marland Kitchen losartan (COZAAR) 100 MG tablet TAKE 1 TABLET BY MOUTH EVERY DAY 90 tablet 3  . Magnesium Oxide 420 MG TABS Take 420 mg by mouth daily.    . metFORMIN (GLUCOPHAGE) 1000 MG tablet Take 1,000 mg by mouth 2 (two) times daily with a meal.    . Multiple Vitamin (MULTIVITAMIN WITH MINERALS) TABS Take 1 tablet by mouth daily.    Marland Kitchen NOVOTWIST 32G X 5 MM MISC     . omeprazole (PRILOSEC) 40 MG capsule TAKE 1 CAPSULE 30 TO 60 MINUTES BEFORE YOUR FIRST AND LAST MEALS OF THE DAY 60 capsule 2  . ONETOUCH DELICA LANCETS FINE MISC 1 each by other route daily. 1/day 250.62 100 each 3  . ONETOUCH VERIO test strip     . pravastatin (PRAVACHOL) 80 MG tablet TAKE 1 TABLET (80 MG TOTAL) BY MOUTH DAILY. 90 tablet 3  . triamcinolone cream (KENALOG) 0.1 % Apply 1 application topically as needed (rash).      No current facility-administered medications for this visit.     Allergies:      Allergies   Allergen Reactions  . Lipitor [Atorvastatin Calcium] Other (See Comments)    Reaction unknown    Social History: The patient  reports that he quit smoking about 37 years ago. His smoking use included Cigarettes. He has a 96.00 pack-year smoking history. He has never used smokeless tobacco. He reports that he drinks about 1.2 oz of alcohol per week . He reports that he does not use drugs.   Family History: The patient's family history includes Coronary artery disease in his father and mother; Deep vein thrombosis in his mother; Heart attack in his mother; Heart disease in his brother, father, mother, and sister; Hyperlipidemia in his mother and sister; Hypertension in his father, mother, and sister.   Review of Systems: Please see the history of present illness.   Otherwise, the review of systems is positive for none.   All other systems are reviewed and negative.   Physical Exam: VS:  BP 110/70   Pulse 88   Ht 5' 10"  (1.778 m)   Wt 224 lb 1.9 oz (101.7 kg)   BMI 32.16 kg/m  .  BMI Body mass index is 32.16 kg/m.     Wt Readings from Last 3 Encounters:  02/15/16 224 lb 1.9 oz (101.7 kg)  02/06/16 241 lb (109.3 kg)  12/07/15 231 lb 1.9 oz (104.8 kg)    General: He is alert and in no acute distress.  Weight is down 17 pounds. Very abrasive verbally and non verbally to Cecille Rubin this am ( which is why she asked me to see him ) .   HEENT: Normal.  Neck: Supple, no JVD, carotid bruits, or masses noted.  Cardiac: Regular rate and rhythm.  Respiratory:  wheezes in the left base.   GI: Soft and nontender.  MS: No  deformity or atrophy. Gait and ROM intact.  Skin: Warm and dry. Color is sallow Neuro:  Strength and sensation are intact and no gross focal deficits noted.  Psych: Alert, somewhat abrasive this am    LABORATORY DATA:  EKG:  EKG is not ordered today.  Recent Labs       Lab Results  Component Value Date   WBC 9.5 12/07/2015   HGB 12.6 (L) 12/07/2015    HCT 39.3 12/07/2015   PLT 214 12/07/2015   GLUCOSE 182 (H) 02/06/2016   CHOL 101 12/07/2015   TRIG 142 12/07/2015   HDL 31 (L) 12/07/2015   LDLCALC 42 12/07/2015   ALT 17 12/07/2015   AST 25 12/07/2015   NA 136 02/06/2016   K 4.8 02/06/2016   CL 100 02/06/2016   CREATININE 1.76 (H) 02/06/2016   BUN 20 02/06/2016   CO2 20 02/06/2016   TSH 3.28 12/07/2015   PSA 0.59 09/20/2012   INR 0.94 02/29/2012   HGBA1C 6.9 (H) 09/22/2013   MICROALBUR 56.2 (H) 07/08/2013      BNP (last 3 results)  Recent Labs (within last 365 days)   Recent Labs  02/06/16 1124  BNP 120.2*      ProBNP (last 3 results) Recent Labs (within last 365 days)  No results for input(s): PROBNP in the last 8760 hours.     Other Studies Reviewed Today:  ECHO: 01/26/2016 - Left ventricle: The cavity size was normal. Wall thickness was increased in a pattern of moderate LVH. There was focal basal hypertrophy. Systolic function was normal. The estimated ejection fraction was in the range of 50% to 55%. - Mitral valve: Moderately calcified annulus. - Left atrium: The atrium was mildly dilated. - Right atrium: The atrium was moderately to severely dilated.  MYOVIEW: 02/25/2013 Overall Impression: This is a low risk scan with no significant change since January, 2014. There is a small/moderate area of scar at the apex. This has been noted in the past. There is no ischemia. LV Ejection Fraction: 53%. LV Wall Motion: Mild hypokinesis at the apex.  Assessment/Plan:  1. Dyspnea on exertion: He was felt to have a component of underlying baseline COPD  He has wheezing in his left base.   2. CAD: He has a history of bypass surgery 5 years ago - last stress test 3 years ago. May need to consider Myoview before arranging cardiac cath due to CKD.   3. CKD stage III:   4. Wheezing: has seen the [place] and was given an inhaler.  Will make a referral to Pulmonary  -  patient also wants to try to be seen by the [facility] pulmonologist.    5. PAF - has underlying PPM in place - on chronic anticoagulation -    Current medicines are reviewed with the patient today.  The patient does not have concerns regarding medicines other than what has been noted above.  The following changes have been made:  See above.  Labs/ tests ordered today include:   No orders of the defined types were placed in this encounter.   Will see in 3 months .     Mertie Moores, MD  02/15/2016 10:21 AM    [address] [place],  [place], [place]  [address] Pager [phone] Phone: [phone]; Fax: [phone]

## 2016-02-16 ENCOUNTER — Encounter: Payer: Self-pay | Admitting: Cardiovascular Disease

## 2016-02-16 ENCOUNTER — Other Ambulatory Visit: Payer: Self-pay

## 2016-02-16 LAB — BASIC METABOLIC PANEL
BUN/Creatinine Ratio: 11 (ref 10–24)
BUN: 16 mg/dL (ref 8–27)
CHLORIDE: 93 mmol/L — AB (ref 96–106)
CO2: 25 mmol/L (ref 18–29)
Calcium: 9.3 mg/dL (ref 8.6–10.2)
Creatinine, Ser: 1.44 mg/dL — ABNORMAL HIGH (ref 0.76–1.27)
GFR calc Af Amer: 55 mL/min/{1.73_m2} — ABNORMAL LOW (ref >59)
GFR calc non Af Amer: 48 mL/min/{1.73_m2} — ABNORMAL LOW (ref >59)
GLUCOSE: 163 mg/dL — AB (ref 65–99)
Potassium: 4 mmol/L (ref 3.5–5.2)
SODIUM: 137 mmol/L (ref 134–144)

## 2016-02-16 MED ORDER — CARVEDILOL 25 MG PO TABS: 25.0000 mg | ORAL_TABLET | Freq: Two times a day (BID) | ORAL | 0 refills | Status: DC

## 2016-02-27 ENCOUNTER — Encounter: Payer: Self-pay | Admitting: Family

## 2016-03-06 ENCOUNTER — Ambulatory Visit (INDEPENDENT_AMBULATORY_CARE_PROVIDER_SITE_OTHER): Payer: Federal, State, Local not specified - PPO | Admitting: Family

## 2016-03-06 ENCOUNTER — Encounter: Payer: Self-pay | Admitting: Family

## 2016-03-06 ENCOUNTER — Ambulatory Visit (HOSPITAL_COMMUNITY)
Admission: RE | Admit: 2016-03-06 | Discharge: 2016-03-06 | Disposition: A | Discharge status: Home / Self Care | Payer: Federal, State, Local not specified - PPO | Source: Ambulatory Visit | Attending: Family | Admitting: Family

## 2016-03-06 ENCOUNTER — Ambulatory Visit (INDEPENDENT_AMBULATORY_CARE_PROVIDER_SITE_OTHER)
Admission: RE | Admit: 2016-03-06 | Discharge: 2016-03-06 | Disposition: A | Discharge status: Home / Self Care | Payer: Federal, State, Local not specified - PPO | Source: Ambulatory Visit | Attending: Family | Admitting: Family

## 2016-03-06 VITALS — BP 127/81 | HR 93 | Temp 97.0°F | Resp 20 | Ht 70.0 in | Wt 229.0 lb

## 2016-03-06 DIAGNOSIS — I6523 Occlusion and stenosis of bilateral carotid arteries: Secondary | ICD-10-CM

## 2016-03-06 DIAGNOSIS — Z9889 Other specified postprocedural states: Secondary | ICD-10-CM

## 2016-03-06 DIAGNOSIS — Z48812 Encounter for surgical aftercare following surgery on the circulatory system: Secondary | ICD-10-CM

## 2016-03-06 DIAGNOSIS — E1151 Type 2 diabetes mellitus with diabetic peripheral angiopathy without gangrene: Secondary | ICD-10-CM

## 2016-03-06 DIAGNOSIS — Z95828 Presence of other vascular implants and grafts: Secondary | ICD-10-CM

## 2016-03-06 DIAGNOSIS — Z4889 Encounter for other specified surgical aftercare: Secondary | ICD-10-CM

## 2016-03-06 DIAGNOSIS — I779 Disorder of arteries and arterioles, unspecified: Secondary | ICD-10-CM

## 2016-03-06 DIAGNOSIS — Z87891 Personal history of nicotine dependence: Secondary | ICD-10-CM | POA: Diagnosis not present

## 2016-03-06 LAB — VAS US CAROTID
LCCAPSYS: 112 cm/s
LEFT ECA DIAS: -18 cm/s
Left CCA dist dias: 29 cm/s
Left CCA dist sys: 136 cm/s
Left CCA prox dias: 19 cm/s
Left ICA dist dias: -30 cm/s
Left ICA dist sys: -103 cm/s
Left ICA prox dias: -26 cm/s
Left ICA prox sys: -106 cm/s
RCCADSYS: -81 cm/s
RCCAPDIAS: 16 cm/s
RIGHT CCA MID DIAS: 13 cm/s
RIGHT ECA DIAS: -7 cm/s
Right CCA prox sys: 82 cm/s

## 2016-03-06 LAB — VAS US LOWER EXTREMITY BYPASS GRAFT DUPLEX: LEFT PERO DIST SYS: 69 cm/s

## 2016-03-06 NOTE — Progress Notes (Signed)
[facility] [place] HISTORY AND PHYSICAL   MRN : [id number]  History of Present Illness:   Edward [name] is a 76 y.o. male patient of Dr. Scot Dock who is s/p left femoropopliteal bypass graft with vein on 03/06/2012.  He is also s/p right CEA on 03/22/09.   He denies claudication symptoms, denies non-healing wounds.  He had slurred speech for about a day, in 2012, no further TIA or stroke symptoms.   He states his recent stress test was normal.  Pt Diabetic: Yes, his A1C in May 2017 was 6.8%, in control  Pt smoker: former smoker, quit in 1981   Pt meds include:  Statin :Yes  Betablocker: Yes  ASA: No, stopped when Eliquis started in October 2015  Other anticoagulants/antiplatelets: Eliquis for atrial fibrillation, (review of records) started after his October 2015 visit with his cardiologist, Dr. Caryl Comes. 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    Current Outpatient Prescriptions  Medication Sig Dispense Refill  . acetaminophen (TYLENOL) 500 MG tablet Take 500 mg by mouth every 6 (six) hours as needed for mild pain or moderate pain.     Marland Kitchen apixaban (ELIQUIS) 5 MG TABS tablet Take 1 tablet (5 mg total) by mouth 2 (two) times daily. 180 tablet 3  . benzonatate (TESSALON) 100 MG capsule Take 100 mg by mouth 4 (four) times daily as needed for cough.    . carvedilol (COREG) 25 MG tablet Take 1 tablet (25 mg total) by mouth 2 (two) times daily with a meal. 180 tablet 0  . diphenhydramine-acetaminophen (TYLENOL PM) 25-500 MG TABS Take 1 tablet by mouth at bedtime as needed (sleep).     . famotidine (PEPCID) 20 MG tablet Take 20 mg by mouth daily.    . furosemide (LASIX) 40 MG tablet TAKE 2 TABLETS BY MOUTH EVERY DAY 180 tablet 2  . glipiZIDE (GLUCOTROL) 5 MG tablet Take 5 mg by mouth 3 (three) times daily.     Marland Kitchen guaiFENesin (ROBITUSSIN) 100 MG/5ML SOLN Take 5 mLs by mouth every 4 (four) hours as needed for cough or to loosen phlegm.    .  Iron Combinations (IRON COMPLEX PO) Take 2 capsules by mouth daily.    Marland Kitchen KLOR-CON M20 20 MEQ tablet TAKE 1 TABLET BY MOUTH EVERY DAY 90 tablet 3  . L-Methylfolate-Algae-B12-B6 (METANX) 3-90.314-2-35 MG CAPS TAKE ONE CAPSULE BY MOUTH EVERY DAY 15 capsule 0  . liraglutide (VICTOZA) 18 MG/3ML SOPN Inject 18 mg into the skin daily.    Marland Kitchen loratadine (CLARITIN) 10 MG tablet Take 10 mg by mouth daily.    Marland Kitchen losartan (COZAAR) 100 MG tablet TAKE 1 TABLET BY MOUTH EVERY DAY 90 tablet 3  . Magnesium Oxide 420 MG TABS Take 420 mg by mouth daily.    . metFORMIN (GLUCOPHAGE) 1000 MG tablet Take 1,000 mg by mouth 2 (two) times daily with a meal.    . Multiple Vitamin (MULTIVITAMIN WITH MINERALS) TABS Take 1 tablet by mouth daily.    Marland Kitchen NOVOTWIST 32G X 5 MM MISC     . omeprazole (PRILOSEC) 40 MG capsule TAKE 1 CAPSULE 30 TO 60 MINUTES BEFORE YOUR FIRST AND LAST MEALS OF THE DAY 60 capsule 2  . ONETOUCH DELICA LANCETS FINE MISC 1 each by other route daily. 1/day 250.62 100 each 3  . ONETOUCH VERIO test strip     . pravastatin (PRAVACHOL) 80 MG tablet TAKE 1 TABLET (80 MG TOTAL) BY MOUTH DAILY. [place]  tablet 3  . triamcinolone cream (KENALOG) 0.1 % Apply 1 application topically as needed (rash).     Marland Kitchen amLODipine (NORVASC) 5 MG tablet Take 1 tablet (5 mg total) by mouth daily. 90 tablet 3   No current facility-administered medications for this visit.     Past Medical History:  Diagnosis Date  . Anemia    ?  . Carotid artery occlusion   . Chronic diastolic CHF (congestive heart failure) ([place]) 04/23/2012  . Claudication ([place])   . Coronary artery disease    s/p CABG 2013  . Diabetic neuropathy (HCC)    mostly feet/ legs  . Dyslipidemia   . Exertional shortness of breath   . GERD (gastroesophageal reflux disease)   . GI bleed    while on triple anticoag therapy  . HTN (hypertension)   . Hyperlipidemia   . Neuromuscular disorder (HCC)    neuropathy  . Pacemaker   . PAF (paroxysmal atrial fibrillation)  (HCC)    s/p MAZE at time of CABG  . PVD (peripheral vascular disease) ([place])    R CEA 2011, prior PTA, left fem-pop 2014  . Stroke ([place])    vs TIA  . Type II diabetes mellitus ([place])     Social History Social History  Substance Use Topics  . Smoking status: Former Smoker    Packs/day: 3.00    Years: 32.00    Types: Cigarettes    Quit date: 01/16/1979  . Smokeless tobacco: Never Used  . Alcohol use 1.2 oz/week    2 Glasses of wine per week     Comment: beer 1-2 weekly (occasional)    Family History Family History  Problem Relation Age of Onset  . Coronary artery disease Father   . Hypertension Father   . Heart disease Father     Heart Disease before age 50  . Heart attack Mother   . Coronary artery disease Mother   . Deep vein thrombosis Mother   . Heart disease Mother     Heart Disease before age 3  . Hyperlipidemia Mother   . Hypertension Mother   . Hypertension Sister   . Heart disease Sister     Heart Disease before age 45  . Hyperlipidemia Sister   . Heart disease Brother     Heart Disease before age 59  . Anesthesia problems Neg Hx   . Diabetes Neg Hx   . Colon cancer Neg Hx     Surgical History Past Surgical History:  Procedure Laterality Date  . ABDOMINAL AORTAGRAM N/A 01/07/2012   Procedure: ABDOMINAL Maxcine Ham;  Surgeon: Angelia Mould, MD;  Location: [facility] CATH LAB;  Service: Cardiovascular;  Laterality: N/A;  . BALLOON DILATION N/A 02/17/2013   Procedure: BALLOON DILATION;  Surgeon: Jerene Bears, MD;  Location: [facility] ENDOSCOPY;  Service: Gastroenterology;  Laterality: N/A;  . CAROTID ENDARTERECTOMY Right 03/2009  . CHOLECYSTECTOMY    . CIRCUMCISION    . COLONOSCOPY WITH PROPOFOL N/A 02/17/2013   Procedure: COLONOSCOPY WITH PROPOFOL;  Surgeon: Jerene Bears, MD;  Location: [facility]OS[facility];  Service: Gastroenterology;  Laterality: N/A;  . CORONARY ARTERY BYPASS GRAFT  06/14/2011   Procedure: CORONARY ARTERY BYPASS GRAFTING (CABG);  Surgeon: Gaye Pollack,  MD;  Location: [place];  Service: Open Heart Surgery;  Laterality: N/A;  Coronary Artery Bypass Graft on pump times six;  utilizing internal mammary artery and right greater saphenous vein harvested endoscopically.x 5 vessels  . ELECTROPHYSIOLOGY STUDY N/A 11/05/2012   Procedure: ELECTROPHYSIOLOGY  STUDY;  Surgeon: Deboraha Sprang, MD;  Location[facility] CATH LAB;  Service: Cardiovascular;  Laterality: N/A;  . ESOPHAGOGASTRODUODENOSCOPY (EGD) WITH PROPOFOL N/A 02/17/2013   Procedure: ESOPHAGOGASTRODUODENOSCOPY (EGD) WITH PROPOFOL;  Surgeon: Jerene Bears, MD;  Location: [facility]OS[facility];  Service: Gastroenterology;  Laterality: N/A;  . FEMORAL-POPLITEAL BYPASS GRAFT Left 03/06/2012   Procedure: BYPASS GRAFT FEMORAL-POPLITEAL ARTERY;  Surgeon: Angelia Mould, MD;  Location: [place];  Service: Vascular;  Laterality: Left;  Left Femoral - Below Knee Popliteal Bypass Graft with Vein and Intraoperative Arteriogram.  . INSERT / REPLACE / REMOVE PACEMAKER  2013   /notes 11/05/2012-[employer]  . JOINT REPLACEMENT Left    left TKA  . KNEE ARTHROSCOPY Left    "I had 3" (11/05/2012)  . LEAD REVISION  11/05/2012   pacemaker/notes 11/05/2012  . LEAD REVISION N/A 11/05/2012   Procedure: LEAD REVISION;  Surgeon: Deboraha Sprang, MD;  Location: [facility] CATH LAB;  Service: Cardiovascular;  Laterality: N/A;  . MAZE  06/14/2011   Procedure: MAZE;  Surgeon: Gaye Pollack, MD;  Location: [place];  Service: Open Heart Surgery;  Laterality: N/A;  [place] left atrial appendage  . PERMANENT PACEMAKER INSERTION N/A 06/20/2011   Procedure: PERMANENT PACEMAKER INSERTION;  Surgeon: Deboraha Sprang, MD;  Location: [facility] CATH LAB;  Service: Cardiovascular;  Laterality: N/A;  . TONSILLECTOMY AND ADENOIDECTOMY    . TOTAL KNEE ARTHROPLASTY Left     Allergies  Allergen Reactions  . Lipitor [Atorvastatin Calcium] Other (See Comments)    Reaction unknown    Current Outpatient Prescriptions  Medication Sig Dispense Refill  . acetaminophen (TYLENOL)  500 MG tablet Take 500 mg by mouth every 6 (six) hours as needed for mild pain or moderate pain.     Marland Kitchen apixaban (ELIQUIS) 5 MG TABS tablet Take 1 tablet (5 mg total) by mouth 2 (two) times daily. 180 tablet 3  . benzonatate (TESSALON) 100 MG capsule Take 100 mg by mouth 4 (four) times daily as needed for cough.    . carvedilol (COREG) 25 MG tablet Take 1 tablet (25 mg total) by mouth 2 (two) times daily with a meal. 180 tablet 0  . diphenhydramine-acetaminophen (TYLENOL PM) 25-500 MG TABS Take 1 tablet by mouth at bedtime as needed (sleep).     . famotidine (PEPCID) 20 MG tablet Take 20 mg by mouth daily.    . furosemide (LASIX) 40 MG tablet TAKE 2 TABLETS BY MOUTH EVERY DAY 180 tablet 2  . glipiZIDE (GLUCOTROL) 5 MG tablet Take 5 mg by mouth 3 (three) times daily.     Marland Kitchen guaiFENesin (ROBITUSSIN) 100 MG/5ML SOLN Take 5 mLs by mouth every 4 (four) hours as needed for cough or to loosen phlegm.    . Iron Combinations (IRON COMPLEX PO) Take 2 capsules by mouth daily.    Marland Kitchen KLOR-CON M20 20 MEQ tablet TAKE 1 TABLET BY MOUTH EVERY DAY 90 tablet 3  . L-Methylfolate-Algae-B12-B6 (METANX) 3-90.314-2-35 MG CAPS TAKE ONE CAPSULE BY MOUTH EVERY DAY 15 capsule 0  . liraglutide (VICTOZA) 18 MG/3ML SOPN Inject 18 mg into the skin daily.    Marland Kitchen loratadine (CLARITIN) 10 MG tablet Take 10 mg by mouth daily.    Marland Kitchen losartan (COZAAR) 100 MG tablet TAKE 1 TABLET BY MOUTH EVERY DAY 90 tablet 3  . Magnesium Oxide 420 MG TABS Take 420 mg by mouth daily.    . metFORMIN (GLUCOPHAGE) 1000 MG tablet Take 1,000 mg by mouth 2 (two) times daily with a  meal.    . Multiple Vitamin (MULTIVITAMIN WITH MINERALS) TABS Take 1 tablet by mouth daily.    Marland Kitchen NOVOTWIST 32G X 5 MM MISC     . omeprazole (PRILOSEC) 40 MG capsule TAKE 1 CAPSULE 30 TO 60 MINUTES BEFORE YOUR FIRST AND LAST MEALS OF THE DAY 60 capsule 2  . ONETOUCH DELICA LANCETS FINE MISC 1 each by other route daily. 1/day 250.62 100 each 3  . ONETOUCH VERIO test strip     .  pravastatin (PRAVACHOL) 80 MG tablet TAKE 1 TABLET (80 MG TOTAL) BY MOUTH DAILY. 90 tablet 3  . triamcinolone cream (KENALOG) 0.1 % Apply 1 application topically as needed (rash).     Marland Kitchen amLODipine (NORVASC) 5 MG tablet Take 1 tablet (5 mg total) by mouth daily. 90 tablet 3   No current facility-administered medications for this visit.      REVIEW OF SYSTEMS: See HPI for pertinent positives and negatives.  Physical Examination Vitals:   03/06/16 1419 03/06/16 1421  BP: 138/84 127/81  Pulse: 93   Resp: 20   Temp: 97 F (36.1 C)   TempSrc: Oral   SpO2: 94%   Weight: 229 lb (103.9 kg)   Height: 5' 10"  (1.778 m)    Body mass index is 32.86 kg/m.  General:  A&O x 3, WDWN, obese male.  Gait: normal  Eyes: PERRLA  Pulmonary: CTAB, non labored respirations Cardiac: regular rhythm, no detected murmur   Carotid Bruits  Left  Right    Negative  Negative   Aorta: is not palpable  Radial pulses: are 2+ and =   VASCULAR EXAM:  Extremities without ischemic changes  without Gangrene; without open wounds.  Both feet are warm and well-perfused. He has trace pitting edema in right lower leg and 1+ in left LE, no erythema.   LE Pulses  LEFT  RIGHT   FEMORAL  not palpable(obese) not palpable (obese)  POPLITEAL  not palpable  not palpable   PT 2+ palpable 2+ palpable  DP 2+ palpable 2+palpable   Abdomen: soft, NT, no palpable masses.  Skin: no rashes, no ulcers.  Musculoskeletal: no muscle wasting or atrophy.  Neurologic: A&O X 3; Appropriate Affect, MOTOR FUNCTION: moving all extremities equally, motor strength 5/5 throughout. Speech is fluent/normal. CN 2-12 intact     ASSESSMENT:  Edward [name] is a 76 y.o. male who is s/p left femoropopliteal bypass graft with vein on 03/06/2012.  He is also s/p right CEA on 03/22/09.   He had a TIA in 2012, none subsequently, no hx of stroke. He has no  claudication, no signs of ischemia in his feet/legs.  DATA   Today's carotid duplex suggests a wdely patent right carotid endarterectomy without evidence of restenosis or hyperplasia.  Evidence of <40% left internal carotid artery stenosis. Bilateral vertebral artery flow is antegrade.  Bilateral subclavian artery waveforms are normal.  Right vertebral artery demonstrates antegrade flow; previous exam indicates functionally occluded. No other significant changes compared to prior exam on 03-03-15.  Today's left LE arterial duplex suggests a widely patent left femoropopliteal arterial bypass graft without evidence of restenosis or hyperplasia. No significant change compared to prior exam on 03-03-15.   ABI's on 03-03-15 remained normal with all triphasic waveforms. ABI's were not performed today since pt arrived late.    PLAN:   I advised pt to walk at least 30 minutes daily.   Based on today's exam and non-invasive vascular lab results, the patient will follow  up in 1 year with the following tests: ABI's,  left LE arterial duplex, and carotid duplex.  I discussed in depth with the patient the nature of atherosclerosis, and emphasized the importance of maximal medical management including strict control of blood pressure, blood glucose, and lipid levels, obtaining regular exercise, and cessation of smoking.  The patient is aware that without maximal medical management the underlying atherosclerotic disease process will progress, limiting the benefit of any interventions.  The patient was given information about stroke prevention and what symptoms should prompt the patient to seek immediate medical care.  The patient was given information about PAD including signs, symptoms, treatment, what symptoms should prompt the patient to seek immediate medical care, and risk reduction measures to take. Thank you for allowing [place] to participate in this patient's care.  Clemon Chambers, RN, MSN,  FNP-C [facility] Office: [phone]  Clinic MD: Early 03/06/2016 2:26 PM

## 2016-03-06 NOTE — Patient Instructions (Signed)
Stroke Prevention Some medical conditions and behaviors are associated with an increased chance of having a stroke. You may prevent a stroke by making healthy choices and managing medical conditions. How can I reduce my risk of having a stroke?  Stay physically active. Get at least 30 minutes of activity on most or all days.  Do not smoke. It may also be helpful to avoid exposure to secondhand smoke.  Limit alcohol use. Moderate alcohol use is considered to be:  No more than 2 drinks per day for men.  No more than 1 drink per day for nonpregnant women.  Eat healthy foods. This involves:  Eating 5 or more servings of fruits and vegetables a day.  Making dietary changes that address high blood pressure (hypertension), high cholesterol, diabetes, or obesity.  Manage your cholesterol levels.  Making food choices that are high in fiber and low in saturated fat, trans fat, and cholesterol may control cholesterol levels.  Take any prescribed medicines to control cholesterol as directed by your health care provider.  Manage your diabetes.  Controlling your carbohydrate and sugar intake is recommended to manage diabetes.  Take any prescribed medicines to control diabetes as directed by your health care provider.  Control your hypertension.  Making food choices that are low in salt (sodium), saturated fat, trans fat, and cholesterol is recommended to manage hypertension.  Ask your health care provider if you need treatment to lower your blood pressure. Take any prescribed medicines to control hypertension as directed by your health care provider.  If you are 18-39 years of age, have your blood pressure checked every 3-5 years. If you are 40 years of age or older, have your blood pressure checked every year.  Maintain a healthy weight.  Reducing calorie intake and making food choices that are low in sodium, saturated fat, trans fat, and cholesterol are recommended to manage  weight.  Stop drug abuse.  Avoid taking birth control pills.  Talk to your health care provider about the risks of taking birth control pills if you are over 35 years old, smoke, get migraines, or have ever had a blood clot.  Get evaluated for sleep disorders (sleep apnea).  Talk to your health care provider about getting a sleep evaluation if you snore a lot or have excessive sleepiness.  Take medicines only as directed by your health care provider.  For some people, aspirin or blood thinners (anticoagulants) are helpful in reducing the risk of forming abnormal blood clots that can lead to stroke. If you have the irregular heart rhythm of atrial fibrillation, you should be on a blood thinner unless there is a good reason you cannot take them.  Understand all your medicine instructions.  Make sure that other conditions (such as anemia or atherosclerosis) are addressed. Get help right away if:  You have sudden weakness or numbness of the face, arm, or leg, especially on one side of the body.  Your face or eyelid droops to one side.  You have sudden confusion.  You have trouble speaking (aphasia) or understanding.  You have sudden trouble seeing in one or both eyes.  You have sudden trouble walking.  You have dizziness.  You have a loss of balance or coordination.  You have a sudden, severe headache with no known cause.  You have new chest pain or an irregular heartbeat. Any of these symptoms may represent a serious problem that is an emergency. Do not wait to see if the symptoms will go away.   Get medical help at once. Call your local emergency services (911 in [place] Do not drive yourself to the hospital.  This information is not intended to replace advice given to you by your health care provider. Make sure you discuss any questions you have with your health care provider. Document Released: 02/09/2004 Document Revised: 06/09/2015 Document Reviewed: 07/04/2012 Elsevier  Interactive Patient Education  2017 Elsevier Inc.      Peripheral Vascular Disease Peripheral vascular disease (PVD) is a disease of the blood vessels that are not part of your heart and brain. A simple term for PVD is poor circulation. In most cases, PVD narrows the blood vessels that carry blood from your heart to the rest of your body. This can result in a decreased supply of blood to your arms, legs, and internal organs, like your stomach or kidneys. However, it most often affects a person's lower legs and feet. There are two types of PVD.  Organic PVD. This is the more common type. It is caused by damage to the structure of blood vessels.  Functional PVD. This is caused by conditions that make blood vessels contract and tighten (spasm). Without treatment, PVD tends to get worse over time. PVD can also lead to acute ischemic limb. This is when an arm or limb suddenly has trouble getting enough blood. This is a medical emergency. Follow these instructions at home:  Take medicines only as told by your doctor.  Do not use any tobacco products, including cigarettes, chewing tobacco, or electronic cigarettes. If you need help quitting, ask your doctor.  Lose weight if you are overweight, and maintain a healthy weight as told by your doctor.  Eat a diet that is low in fat and cholesterol. If you need help, ask your doctor.  Exercise regularly. Ask your doctor for some good activities for you.  Take good care of your feet.  Wear comfortable shoes that fit well.  Check your feet often for any cuts or sores. Contact a doctor if:  You have cramps in your legs while walking.  You have leg pain when you are at rest.  You have coldness in a leg or foot.  Your skin changes.  You are unable to get or have an erection (erectile dysfunction).  You have cuts or sores on your feet that are not healing. Get help right away if:  Your arm or leg turns cold and blue.  Your arms or legs  become red, warm, swollen, painful, or numb.  You have chest pain or trouble breathing.  You suddenly have weakness in your face, arm, or leg.  You become very confused or you cannot speak.  You suddenly have a very bad headache.  You suddenly cannot see. This information is not intended to replace advice given to you by your health care provider. Make sure you discuss any questions you have with your health care provider. Document Released: 03/28/2009 Document Revised: 06/09/2015 Document Reviewed: 06/11/2013 Elsevier Interactive Patient Education  2017 Elsevier Inc.  

## 2016-03-09 NOTE — Addendum Note (Signed)
Addended by: Lianne Cure A on: 03/09/2016 02:38 PM   Modules accepted: Orders

## 2016-03-12 ENCOUNTER — Ambulatory Visit: Payer: Federal, State, Local not specified - PPO | Admitting: Cardiovascular Disease

## 2016-03-20 ENCOUNTER — Encounter: Payer: Federal, State, Local not specified - PPO | Admitting: Internal Medicine

## 2016-03-23 ENCOUNTER — Ambulatory Visit (INDEPENDENT_AMBULATORY_CARE_PROVIDER_SITE_OTHER): Payer: Federal, State, Local not specified - PPO | Admitting: Internal Medicine

## 2016-03-23 ENCOUNTER — Encounter: Payer: Self-pay | Admitting: Internal Medicine

## 2016-03-23 VITALS — BP 144/80 | HR 97 | Ht 70.0 in | Wt 237.0 lb

## 2016-03-23 DIAGNOSIS — Z950 Presence of cardiac pacemaker: Secondary | ICD-10-CM | POA: Diagnosis not present

## 2016-03-23 DIAGNOSIS — I498 Other specified cardiac arrhythmias: Secondary | ICD-10-CM

## 2016-03-23 DIAGNOSIS — I495 Sick sinus syndrome: Secondary | ICD-10-CM | POA: Diagnosis not present

## 2016-03-23 DIAGNOSIS — I480 Paroxysmal atrial fibrillation: Secondary | ICD-10-CM

## 2016-03-23 LAB — CUP PACEART INCLINIC DEVICE CHECK
Battery Impedance: 458 Ohm
Battery Voltage: 2.77 V
Brady Statistic AP VP Percent: 93 %
Brady Statistic AS VP Percent: 0 %
Date Time Interrogation Session: 20180309143642
Implantable Lead Implant Date: 20141022
Implantable Lead Location: 753860
Implantable Pulse Generator Implant Date: 20130605
Lead Channel Impedance Value: 520 Ohm
Lead Channel Setting Pacing Amplitude: 2 V
Lead Channel Setting Pacing Pulse Width: 0.4 ms
Lead Channel Setting Sensing Sensitivity: 2.8 mV
MDC IDC LEAD IMPLANT DT: 20130605
MDC IDC LEAD LOCATION: 753859
MDC IDC MSMT BATTERY REMAINING LONGEVITY: 75 mo
MDC IDC MSMT LEADCHNL RA IMPEDANCE VALUE: 411 Ohm
MDC IDC SET LEADCHNL RV PACING AMPLITUDE: 2.5 V
MDC IDC STAT BRADY AP VS PERCENT: 7 %
MDC IDC STAT BRADY AS VS PERCENT: 0 %

## 2016-03-23 NOTE — Progress Notes (Signed)
Patient Care Team: Sedalia Muta, FNP as PCP - General (Family Medicine) Angelia Mould, MD as Attending Physician (Vascular Surgery) [name], MD as Attending Physician (Cardiology) Gaye Pollack, MD as Attending Physician (Cardiothoracic Surgery) Deboraha Sprang, MD as Attending Physician (Cardiology) Rodney Langton, MD as Referring Physician (Internal Medicine)   HPI  Edward [name] is a 76 y.o. male Seen in followup for pacemaker implanted for sinus node dysfunction and continue rapid junctional rhythm. This emerged post bypass surgery with concomitant maze.     We undertook pacing for atrial lead revision. It turned out that there is significantly compartmentalized atrial activity and we finally were able to find a site we can capture in case the atrium; there was no significant interval improvement.   Notes from last months reviewed   Ongoing issue with cough; has been seen by pulmonary 3/17 with a presumptive diagnosis of reflux. No interval follow-up. [facility]. 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 Anemia    ?  . Carotid artery occlusion   . Chronic diastolic CHF (congestive heart failure) ([place]) 04/23/2012  . Claudication ([place])   . Coronary artery disease    s/p CABG 2013  . Diabetic neuropathy (HCC)    mostly feet/ legs  . Dyslipidemia   . Exertional shortness of breath   . GERD (gastroesophageal reflux disease)   . GI bleed    while on triple anticoag therapy  . HTN (hypertension)   . Hyperlipidemia   . Neuromuscular disorder (HCC)    neuropathy  . Pacemaker   . PAF (paroxysmal atrial fibrillation) (HCC)    s/p MAZE at time of CABG  . PVD (peripheral vascular disease) ([place])    R CEA 2011, prior PTA, left fem-pop 2014  . Stroke ([place])    vs TIA  . Type  II diabetes mellitus ([place])     Past Surgical History:  Procedure Laterality Date  . ABDOMINAL AORTAGRAM N/A 01/07/2012   Procedure: ABDOMINAL Maxcine Ham;  Surgeon: Angelia Mould, MD;  [facility]: [facility] C[facility] LAB;  Service: Cardiovascular;  Laterality: N/A;  . BALLOON DILATION N/A 02/17/2013   Procedure: BALLOON DILATION;  Surgeon: Jerene Bears, MD;  [facility];  Service: Gastroenterology;  Laterality: N/A;  . CAROTID ENDARTERECTOMY Right 03/2009  . CHOLECYSTECTOMY    . CIRCUMCISION    . COLONOSCOPY WITH PROPOFOL N/A 02/17/2013   Procedure: COLONOSCOPY WITH PROPOFOL;  Surgeon: Jerene Bears, MD;  [facility];  Service: Gastroenterology;  Laterality: N/A;  . CORONARY ARTERY BYPASS GRAFT  06/14/2011   Procedure: CORONARY ARTERY BYPASS GRAFTING (CABG);  Surgeon: Gaye Pollack, MD;  Location: [place];  Service: Open Heart Surgery;  Laterality: N/A;  Coronary Artery Bypass Graft on pump times six;  utilizing internal mammary artery and right greater saphenous vein harvested endoscopically.x 5 vessels  . ELECTROPHYSIOLOGY STUDY N/A 11/05/2012   Procedure: ELECTROPHYSIOLOGY STUDY;  Surgeon: Deboraha Sprang, MD;  Location: [facility] CATH LAB;  Service: Cardiovascular;  Laterality: N/A;  . ESOPHAGOGASTRODUODENOSCOPY (EGD) WITH PROPOFOL N/A 02/17/2013   Procedure: ESOPHAGOGASTRODUODENOSCOPY (EGD) WITH PROPOFOL;  Surgeon: Jerene Bears, MD;  [facility];  Service: Gastroenterology;  Laterality: N/A;  . FEMORAL-POPLITEAL BYPASS GRAFT Left 03/06/2012   Procedure:  BYPASS GRAFT FEMORAL-POPLITEAL ARTERY;  Surgeon: Angelia Mould, MD;  Location: [place];  Service: Vascular;  Laterality: Left;  Left Femoral - Below Knee Popliteal Bypass Graft with Vein and Intraoperative Arteriogram.  . INSERT / REPLACE / REMOVE PACEMAKER  2013   /notes 11/05/2012-Medtronic  . JOINT REPLACEMENT Left    left TKA  . KNEE ARTHROSCOPY Left    "I had 3" (11/05/2012)  . LEAD REVISION  11/05/2012    pacemaker/notes 11/05/2012  . LEAD REVISION N/A 11/05/2012   Procedure: LEAD REVISION;  Surgeon: Deboraha Sprang, MD;  Location: [facility] CATH LAB;  Service: Cardiovascular;  Laterality: N/A;  . MAZE  06/14/2011   Procedure: MAZE;  Surgeon: Gaye Pollack, MD;  Location: [place];  Service: Open Heart Surgery;  Laterality: N/A;  Ligate left atrial appendage  . PERMANENT PACEMAKER INSERTION N/A 06/20/2011   Procedure: PERMANENT PACEMAKER INSERTION;  Surgeon: Deboraha Sprang, MD;  Location: [facility] CATH LAB;  Service: Cardiovascular;  Laterality: N/A;  . TONSILLECTOMY AND ADENOIDECTOMY    . TOTAL KNEE ARTHROPLASTY Left     Current Outpatient Prescriptions  Medication Sig Dispense Refill  . acetaminophen (TYLENOL) 500 MG tablet Take 500 mg by mouth every 6 (six) hours as needed for mild pain or moderate pain.     Marland Kitchen apixaban (ELIQUIS) 5 MG TABS tablet Take 1 tablet (5 mg total) by mouth 2 (two) times daily. 180 tablet 3  . benzonatate (TESSALON) 100 MG capsule Take 100 mg by mouth 4 (four) times daily as needed for cough.    . carvedilol (COREG) 25 MG tablet Take 1 tablet (25 mg total) by mouth 2 (two) times daily with a meal. 180 tablet 0  . diphenhydramine-acetaminophen (TYLENOL PM) 25-500 MG TABS Take 1 tablet by mouth at bedtime as needed (sleep).     . famotidine (PEPCID) 20 MG tablet Take 20 mg by mouth daily.    . furosemide (LASIX) 40 MG tablet TAKE 2 TABLETS BY MOUTH EVERY DAY 180 tablet 2  . glipiZIDE (GLUCOTROL) 5 MG tablet Take 5 mg by mouth 3 (three) times daily.     Marland Kitchen guaiFENesin (ROBITUSSIN) 100 MG/5ML SOLN Take 5 mLs by mouth every 4 (four) hours as needed for cough or to loosen phlegm.    . Iron Combinations (IRON COMPLEX PO) Take 2 capsules by mouth daily.    Marland Kitchen KLOR-CON M20 20 MEQ tablet TAKE 1 TABLET BY MOUTH EVERY DAY 90 tablet 3  . L-Methylfolate-Algae-B12-B6 (METANX) 3-90.314-2-35 MG CAPS TAKE ONE CAPSULE BY MOUTH EVERY DAY 15 capsule 0  . liraglutide (VICTOZA) 18 MG/3ML SOPN Inject 18 mg  into the skin daily.    Marland Kitchen loratadine (CLARITIN) 10 MG tablet Take 10 mg by mouth daily.    Marland Kitchen losartan (COZAAR) 100 MG tablet TAKE 1 TABLET BY MOUTH EVERY DAY 90 tablet 3  . Magnesium Oxide 420 MG TABS Take 420 mg by mouth daily.    . metFORMIN (GLUCOPHAGE) 1000 MG tablet Take 1,000 mg by mouth 2 (two) times daily with a meal.    . Multiple Vitamin (MULTIVITAMIN WITH MINERALS) TABS Take 1 tablet by mouth daily.    Marland Kitchen NOVOTWIST 32G X 5 MM MISC     . omeprazole (PRILOSEC) 40 MG capsule TAKE 1 CAPSULE 30 TO 60 MINUTES BEFORE YOUR FIRST AND LAST MEALS OF THE DAY 60 capsule 2  . ONETOUCH DELICA LANCETS FINE MISC 1 each by other route daily. 1/day 250.62 100 each 3  . ONETOUCH VERIO  test strip     . pravastatin (PRAVACHOL) 80 MG tablet TAKE 1 TABLET (80 MG TOTAL) BY MOUTH DAILY. 90 tablet 3  . triamcinolone cream (KENALOG) 0.1 % Apply 1 application topically as needed (rash).     Marland Kitchen amLODipine (NORVASC) 5 MG tablet Take 1 tablet (5 mg total) by mouth daily. 90 tablet 3   No current facility-administered medications for this visit.     Allergies  Allergen Reactions  . Lipitor [Atorvastatin Calcium] Other (See Comments)    Reaction unknown    Review of Systems negative except from HPI and PMH  Physical Exam BP (!) 144/80   Pulse 97   Ht 5' 10"  (1.778 m)   Wt 237 lb (107.5 kg)   SpO2 97%   BMI 34.01 kg/m  Well developed and well nourished in no acute distress HENT normal E scleral and icterus clear Neck Supple JVP flat; carotids brisk and full Clear to ausculation Device pocket well healed; without hematoma or erythema.  There is no tethering Regular rate and rhythm, no murmurs gallops or rub Soft with active bowel sounds No clubbing cyanosis  Edema Alert and oriented, grossly normal motor and sensory function Skin Warm and Dry  ECG demonstrates AV pacing with potentially very small low-voltage T-wave  Assessment and  Plan Sinus node dysfunction  Junctional  rhythm  Pacemaker-Medtronic  Atrial fibrillation  Ischemic heart disease with prior bypass  No ischemia  Functional status is stable  On anticoagulation without bleeding issues  No intercurrent atrial fibrillation or flutter

## 2016-03-23 NOTE — Patient Instructions (Signed)
Medication Instructions:    Your physician recommends that you continue on your current medications as directed. Please refer to the Current Medication list given to you today.  --- If you need a refill on your cardiac medications before your next appointment, please call your pharmacy. ---  Labwork:  None ordered  Testing/Procedures:  None ordered  Follow-Up:  Your physician recommends that you schedule a follow-up appointment in: July with device clinic   Your physician wants you to follow-up in: 1 year with Dr. Caryl Comes.  You will receive a reminder letter in the mail two months in advance. If you don't receive a letter, please call our office to schedule the follow-up appointment.  Thank you for choosing [facility]!!

## 2016-04-17 ENCOUNTER — Encounter: Payer: Self-pay | Admitting: Cardiovascular Disease

## 2016-04-29 ENCOUNTER — Encounter: Payer: Self-pay | Admitting: Cardiovascular Disease

## 2016-05-03 ENCOUNTER — Ambulatory Visit (INDEPENDENT_AMBULATORY_CARE_PROVIDER_SITE_OTHER): Payer: Federal, State, Local not specified - PPO | Admitting: Cardiovascular Disease

## 2016-05-03 ENCOUNTER — Encounter: Payer: Self-pay | Admitting: Cardiovascular Disease

## 2016-05-03 VITALS — BP 144/88 | HR 89 | Ht 70.0 in | Wt 241.4 lb

## 2016-05-03 DIAGNOSIS — I495 Sick sinus syndrome: Secondary | ICD-10-CM

## 2016-05-03 DIAGNOSIS — I480 Paroxysmal atrial fibrillation: Secondary | ICD-10-CM

## 2016-05-03 DIAGNOSIS — I2510 Atherosclerotic heart disease of native coronary artery without angina pectoris: Secondary | ICD-10-CM | POA: Diagnosis not present

## 2016-05-03 NOTE — Progress Notes (Signed)
CARDIOLOGY OFFICE NOTE  Date:  02/15/2016    Edward [name] Date of Birth: 1940/05/04 Medical Record #[id number]  PCP:  Molli Barrows, FNP    Cardiologist:  Hedy Garro       Chief Complaint  Patient presents with  . Shortness of Breath    Follow up visit - seen for Dr. Acie Fredrickson    History of Present Illness: Edward [name] is a 76 y.o. male who presents today for a follow up visit. Seen for Dr. Acie Fredrickson.   He has a history of afib s/p MAZE w/ CABG 05/2011 (LIMA-LAD, SVG-D1, SVG-D2, SVG-CFX, SVG-PDA-PL), DM, HTN, HLD, PAD s/p R-CEA &L fem-pop, TIA, & anemia. He has PPM in place - followed by Dr. Caryl Comes.   Last seen back in November by Dr. Acie Fredrickson. Noted some fatigue. Noted he likes his salt.   Seen about a week ago as a work in by [employer], [employer] for shortness of breath and wheezing. There was felt to be some component of volume overload - his diuretics were increased for a few days. He was to come back to [place] for further follow up.   Comes in today. Here with his wife. He is quite abrasive. Very hard to get any history out of him. Will not look at me. Hard to say if he is better. Weight is down. Seems more bothered by cough. Has been to the [place] noted. Given inhaler. Cough not productive. Has chest discomfort - sounds like it is from coughing but again - he really does not wish to give me any history.   Spoke with Dr. Acie Fredrickson here in the office - apparently this patient is not to see male providers due to past behavior.   [name]. 31, 2018  I was asked to see Edward [name] today on an urgent basis.  He was seen by Cecille Rubin.    She asked me to see him because he was being belligerent with her .  Has lost 17 lbs with lasix 40 BID . Still complains of cough.   CT of chest in March 2017 showed small pleural effusion .  Mild COPD,  Pulmonary art. Enlargement .  Coughing up brown sputum.  Advised him to see pulmonary  - he wants to ask the [place] to send him to the [place]  pulmonologist.   May 03, 2016:  Edward [name] is seen today for follow up of his worsening dyspnea.  Feeling much better . No CP ,        Past Medical History:  Diagnosis Date  . Anemia    ?  . Carotid artery occlusion   . Chronic diastolic CHF (congestive heart failure) ([place]) 04/23/2012  . Claudication ([place])   . Coronary artery disease    s/p CABG 2013  . Diabetic neuropathy (HCC)    mostly feet/ legs  . Dyslipidemia   . Exertional shortness of breath   . GERD (gastroesophageal reflux disease)   . GI bleed    while on triple anticoag therapy  . HTN (hypertension)   . Hyperlipidemia   . Neuromuscular disorder (HCC)    neuropathy  . Pacemaker   . PAF (paroxysmal atrial fibrillation) (HCC)    s/p MAZE at time of CABG  . PVD (peripheral vascular disease) ([place])    R CEA 2011, prior PTA, left fem-pop 2014  . Stroke ([place])    vs TIA  . Type II diabetes mellitus ([place])  Past Surgical History:  Procedure Laterality Date  . ABDOMINAL AORTAGRAM N/A 01/07/2012   Procedure: ABDOMINAL Maxcine Ham;  Surgeon: Angelia Mould, MD;  Location: [facility] CATH LAB;  Service: Cardiovascular;  Laterality: N/A;  . BALLOON DILATION N/A 02/17/2013   Procedure: BALLOON DILATION;  Surgeon: Jerene Bears, MD;  [facility];  Service: Gastroenterology;  Laterality: N/A;  . CAROTID ENDARTERECTOMY Right 03/2009  . CHOLECYSTECTOMY    . CIRCUMCISION    . COLONOSCOPY WITH PROPOFOL N/A 02/17/2013   Procedure: COLONOSCOPY WITH PROPOFOL;  Surgeon: Jerene Bears, MD;  [facility];  Service: Gastroenterology;  Laterality: N/A;  . CORONARY ARTERY BYPASS GRAFT  06/14/2011   Procedure: CORONARY ARTERY BYPASS GRAFTING (CABG);  Surgeon: Gaye Pollack, MD;  Location: [place];  Service: Open Heart Surgery;  Laterality: N/A;  Coronary Artery Bypass Graft on pump times six;  utilizing internal mammary artery and right greater saphenous vein harvested  endoscopically.x 5 vessels  . ELECTROPHYSIOLOGY STUDY N/A 11/05/2012   Procedure: ELECTROPHYSIOLOGY STUDY;  Surgeon: Deboraha Sprang, MD;  Location[facility] C[facility] LAB;  Service: Cardiovascular;  Laterality: N/A;  . ESOPHAGOGASTRODUODENOSCOPY (EGD) WITH PROPOFOL N/A 02/17/2013   Procedure: ESOPHAGOGASTRODUODENOSCOPY (EGD) WITH PROPOFOL;  Surgeon: Jerene Bears, MD;  [facility];  Service: Gastroenterology;  Laterality: N/A;  . FEMORAL-POPLITEAL BYPASS GRAFT Left 03/06/2012   Procedure: BYPASS GRAFT FEMORAL-POPLITEAL ARTERY;  Surgeon: Angelia Mould, MD;  Location: [place];  Service: Vascular;  Laterality: Left;  Left Femoral - Below Knee Popliteal Bypass Graft with Vein and Intraoperative Arteriogram.  . INSERT / REPLACE / REMOVE PACEMAKER  2013   /notes 11/05/2012-[employer]  . JOINT REPLACEMENT Left    left TKA  . KNEE ARTHROSCOPY Left    "I had 3" (11/05/2012)  . LEAD REVISION  11/05/2012   pacemaker/notes 11/05/2012  . LEAD REVISION N/A 11/05/2012   Procedure: LEAD REVISION;  Surgeon: Deboraha Sprang, MD;  Location: [facility] CATH LAB;  Service: Cardiovascular;  Laterality: N/A;  . MAZE  06/14/2011   Procedure: MAZE;  Surgeon: Gaye Pollack, MD;  Location: [place];  Service: Open Heart Surgery;  Laterality: N/A;  Ligate left atrial appendage  . PERMANENT PACEMAKER INSERTION N/A 06/20/2011   Procedure: PERMANENT PACEMAKER INSERTION;  Surgeon: Deboraha Sprang, MD;  Location: [facility] CATH LAB;  Service: Cardiovascular;  Laterality: N/A;  . TONSILLECTOMY AND ADENOIDECTOMY    . TOTAL KNEE ARTHROPLASTY Left      Medications:       Current Outpatient Prescriptions  Medication Sig Dispense Refill  . acetaminophen (TYLENOL) 500 MG tablet Take 500 mg by mouth every 6 (six) hours as needed for mild pain or moderate pain.     Marland Kitchen amLODipine (NORVASC) 5 MG tablet Take 1 tablet (5 mg total) by mouth daily. 90 tablet 3  . apixaban (ELIQUIS) 5 MG TABS tablet Take 1 tablet (5 mg total) by  mouth 2 (two) times daily. 180 tablet 3  . benzonatate (TESSALON) 100 MG capsule Take 100 mg by mouth 4 (four) times daily as needed for cough.    . carvedilol (COREG) 25 MG tablet TAKE 1 TABLET (25 MG TOTAL) BY MOUTH 2 (TWO) TIMES DAILY. 180 tablet 3  . diphenhydramine-acetaminophen (TYLENOL PM) 25-500 MG TABS Take 1 tablet by mouth at bedtime as needed (sleep).     . famotidine (PEPCID) 20 MG tablet Take 20 mg by mouth daily.    . furosemide (LASIX) 40 MG tablet TAKE 2 TABLETS BY MOUTH EVERY  DAY 180 tablet 2  . glipiZIDE (GLUCOTROL) 5 MG tablet Take 5 mg by mouth 3 (three) times daily.     Marland Kitchen guaiFENesin (ROBITUSSIN) 100 MG/5ML SOLN Take 5 mLs by mouth every 4 (four) hours as needed for cough or to loosen phlegm.    . Iron Combinations (IRON COMPLEX PO) Take 2 capsules by mouth daily.    Marland Kitchen KLOR-CON M20 20 MEQ tablet TAKE 1 TABLET BY MOUTH EVERY DAY 90 tablet 3  . L-Methylfolate-Algae-B12-B6 (METANX) 3-90.314-2-35 MG CAPS TAKE ONE CAPSULE BY MOUTH EVERY DAY 15 capsule 0  . liraglutide (VICTOZA) 18 MG/3ML SOPN Inject 18 mg into the skin daily.    Marland Kitchen loratadine (CLARITIN) 10 MG tablet Take 10 mg by mouth daily.    Marland Kitchen losartan (COZAAR) 100 MG tablet TAKE 1 TABLET BY MOUTH EVERY DAY 90 tablet 3  . Magnesium Oxide 420 MG TABS Take 420 mg by mouth daily.    . metFORMIN (GLUCOPHAGE) 1000 MG tablet Take 1,000 mg by mouth 2 (two) times daily with a meal.    . Multiple Vitamin (MULTIVITAMIN WITH MINERALS) TABS Take 1 tablet by mouth daily.    Marland Kitchen NOVOTWIST 32G X 5 MM MISC     . omeprazole (PRILOSEC) 40 MG capsule TAKE 1 CAPSULE 30 TO 60 MINUTES BEFORE YOUR FIRST AND LAST MEALS OF THE DAY 60 capsule 2  . ONETOUCH DELICA LANCETS FINE MISC 1 each by other route daily. 1/day 250.62 100 each 3  . ONETOUCH VERIO test strip     . pravastatin (PRAVACHOL) 80 MG tablet TAKE 1 TABLET (80 MG TOTAL) BY MOUTH DAILY. 90 tablet 3  . triamcinolone cream (KENALOG) 0.1 % Apply 1 application  topically as needed (rash).      No current facility-administered medications for this visit.     Allergies:      Allergies  Allergen Reactions  . Lipitor [Atorvastatin Calcium] Other (See Comments)    Reaction unknown    Social History: The patient  reports that he quit smoking about 37 years ago. His smoking use included Cigarettes. He has a 96.00 pack-year smoking history. He has never used smokeless tobacco. He reports that he drinks about 1.2 oz of alcohol per week . He reports that he does not use drugs.   Family History: The patient's family history includes Coronary artery disease in his father and mother; Deep vein thrombosis in his mother; Heart attack in his mother; Heart disease in his brother, father, mother, and sister; Hyperlipidemia in his mother and sister; Hypertension in his father, mother, and sister.   Review of Systems: Please see the history of present illness.   Otherwise, the review of systems is positive for none.   All other systems are reviewed and negative.   Physical Exam: VS:  BP 110/70   Pulse 88   Ht 5' 10"  (1.778 m)   Wt 224 lb 1.9 oz (101.7 kg)   BMI 32.16 kg/m  .  BMI Body mass index is 32.16 kg/m.     Wt Readings from Last 3 Encounters:  02/15/16 224 lb 1.9 oz (101.7 kg)  02/06/16 241 lb (109.3 kg)  12/07/15 231 lb 1.9 oz (104.8 kg)    General: He is alert and in no acute distress.  Weight is down 17 pounds. Very abrasive verbally and non verbally to Cecille Rubin this am ( which is why she asked me to see him ) .   HEENT: Normal.  Neck: Supple, no JVD, carotid bruits, or  masses noted.  Cardiac: Regular rate and rhythm.    Trace edema bilaterally  Respiratory:  wheezes in the left base.   GI: Soft and nontender.  MS: No deformity or atrophy. Gait and ROM intact.  Skin: Warm and dry. Color is sallow Neuro:  Strength and sensation are intact and no gross focal deficits noted.  Psych: Alert, somewhat abrasive this am     LABORATORY DATA:  EKG:  EKG is not ordered today.  Recent Labs       Lab Results  Component Value Date   WBC 9.5 12/07/2015   HGB 12.6 (L) 12/07/2015   HCT 39.3 12/07/2015   PLT 214 12/07/2015   GLUCOSE 182 (H) 02/06/2016   CHOL 101 12/07/2015   TRIG 142 12/07/2015   HDL 31 (L) 12/07/2015   LDLCALC 42 12/07/2015   ALT 17 12/07/2015   AST 25 12/07/2015   NA 136 02/06/2016   K 4.8 02/06/2016   CL 100 02/06/2016   CREATININE 1.76 (H) 02/06/2016   BUN 20 02/06/2016   CO2 20 02/06/2016   TSH 3.28 12/07/2015   PSA 0.59 09/20/2012   INR 0.94 02/29/2012   HGBA1C 6.9 (H) 09/22/2013   MICROALBUR 56.2 (H) 07/08/2013      BNP (last 3 results)  Recent Labs (within last 365 days)   Recent Labs  02/06/16 1124  BNP 120.2*      ProBNP (last 3 results) Recent Labs (within last 365 days)  No results for input(s): PROBNP in the last 8760 hours.     Other Studies Reviewed Today:  ECHO: 01/26/2016 - Left ventricle: The cavity size was normal. Wall thickness was increased in a pattern of moderate LVH. There was focal basal hypertrophy. Systolic function was normal. The estimated ejection fraction was in the range of 50% to 55%. - Mitral valve: Moderately calcified annulus. - Left atrium: The atrium was mildly dilated. - Right atrium: The atrium was moderately to severely dilated.  MYOVIEW: 02/25/2013 Overall Impression: This is a low risk scan with no significant change since January, 2014. There is a small/moderate area of scar at the apex. This has been noted in the past. There is no ischemia. LV Ejection Fraction: 53%. LV Wall Motion: Mild hypokinesis at the apex.  Assessment/Plan:  1. Dyspnea on exertion: He was felt to have a component of underlying baseline COPD  He has wheezing in his left base.   2. CAD: He has a history of bypass surgery 5 years ago - last stress test 3 years ago. May need to consider  Myoview before arranging cardiac cath due to CKD.   3. CKD stage III:   4. Wheezing: has seen the [place] and was given an inhaler.  Will make a referral to Pulmonary  - patient also wants to try to be seen by the [facility] pulmonologist.    5. PAF - has underlying PPM in place - on chronic anticoagulation -    Current medicines are reviewed with the patient today.  The patient does not have concerns regarding medicines other than what has been noted above.  The following changes have been made:  See above.  Labs/ tests ordered today include:   No orders of the defined types were placed in this encounter.   Will see in 6  months .     Mertie Moores, MD  05/03/2016 11:15 AM    [place],  [address] [place], [place]  [phone] Pager  [place] Phone: [phone]; Fax: [phone]

## 2016-05-03 NOTE — Patient Instructions (Signed)

## 2016-06-29 ENCOUNTER — Other Ambulatory Visit: Payer: Self-pay

## 2016-07-02 ENCOUNTER — Encounter: Payer: Self-pay | Admitting: Cardiovascular Disease

## 2016-07-02 ENCOUNTER — Telehealth: Payer: Self-pay | Admitting: Nurse Practitioner

## 2016-07-02 DIAGNOSIS — I480 Paroxysmal atrial fibrillation: Secondary | ICD-10-CM

## 2016-07-02 DIAGNOSIS — I5032 Chronic diastolic (congestive) heart failure: Secondary | ICD-10-CM

## 2016-07-02 MED ORDER — FUROSEMIDE 40 MG PO TABS: ORAL_TABLET | ORAL | 3 refills | Status: DC

## 2016-07-02 NOTE — Telephone Encounter (Signed)
Received call from patient's wife who states she made an error in sending the email for refill of patient's medication. She states he takes furosemide 2 40 mg tablets in the morning and 1 40 mg tablet in the afternoon. I advised that I will send a new Rx and that we need to have the patient have a bmet in the next few days. She states she will tell the patient to come by tomorrow for lab work. She thanked me for my help.

## 2016-07-02 NOTE — Telephone Encounter (Signed)
Spoke with patient to discuss message received from his wife:  Dr. Acie Fredrickson:    Kym has been taking his Lasix 2 28m pills in the morning and 1 239mpill in the evening for swelling in his legs and feet. He has run out of his current refill and it it too early to re-fill because the Rx is ONLY for 2 204mills per day. Could you call in a new RX for the 3 20 mg dosage? He would greatly appreciate it. [facility] [phone]  Thank you.........MyMarland Kitchenil (Mimi) Fitzner   He states his wife prepares his pill bottle and he just takes what she gives him. He asks if I can call back after 12:30 because she is currently at the gym. He states he gives permission to discuss anything related to him with her.

## 2016-07-03 ENCOUNTER — Other Ambulatory Visit: Payer: Federal, State, Local not specified - PPO | Admitting: *Deleted

## 2016-07-03 ENCOUNTER — Other Ambulatory Visit: Payer: Federal, State, Local not specified - PPO

## 2016-07-03 DIAGNOSIS — I5032 Chronic diastolic (congestive) heart failure: Secondary | ICD-10-CM

## 2016-07-03 DIAGNOSIS — I480 Paroxysmal atrial fibrillation: Secondary | ICD-10-CM

## 2016-07-04 LAB — BASIC METABOLIC PANEL
BUN / CREAT RATIO: 11 (ref 10–24)
BUN: 16 mg/dL (ref 8–27)
CALCIUM: 9.3 mg/dL (ref 8.6–10.2)
CHLORIDE: 100 mmol/L (ref 96–106)
CO2: 25 mmol/L (ref 20–29)
Creatinine, Ser: 1.42 mg/dL — ABNORMAL HIGH (ref 0.76–1.27)
GFR calc non Af Amer: 48 mL/min/{1.73_m2} — ABNORMAL LOW (ref >59)
GFR, EST AFRICAN AMERICAN: 56 mL/min/{1.73_m2} — AB (ref >59)
Glucose: 139 mg/dL — ABNORMAL HIGH (ref 65–99)
POTASSIUM: 4.7 mmol/L (ref 3.5–5.2)
SODIUM: 140 mmol/L (ref 134–144)

## 2016-08-27 ENCOUNTER — Ambulatory Visit (INDEPENDENT_AMBULATORY_CARE_PROVIDER_SITE_OTHER): Payer: Federal, State, Local not specified - PPO | Admitting: *Deleted

## 2016-08-27 DIAGNOSIS — I495 Sick sinus syndrome: Secondary | ICD-10-CM | POA: Diagnosis not present

## 2016-08-29 LAB — CUP PACEART INCLINIC DEVICE CHECK
Battery Remaining Longevity: 67 mo
Battery Voltage: 2.77 V
Brady Statistic AP VS Percent: 8 %
Date Time Interrogation Session: 20180813161644
Implantable Lead Implant Date: 20130605
Implantable Lead Location: 753860
Implantable Lead Model: 1948
Lead Channel Impedance Value: 504 Ohm
Lead Channel Pacing Threshold Amplitude: 0.5 V
Lead Channel Pacing Threshold Amplitude: 0.5 V
Lead Channel Pacing Threshold Pulse Width: 0.4 ms
Lead Channel Pacing Threshold Pulse Width: 0.4 ms
Lead Channel Sensing Intrinsic Amplitude: 11.2 mV
Lead Channel Setting Pacing Pulse Width: 0.4 ms
MDC IDC LEAD IMPLANT DT: 20141022
MDC IDC LEAD LOCATION: 753859
MDC IDC MSMT BATTERY IMPEDANCE: 585 Ohm
MDC IDC MSMT LEADCHNL RA IMPEDANCE VALUE: 428 Ohm
MDC IDC MSMT LEADCHNL RV PACING THRESHOLD AMPLITUDE: 0.625 V
MDC IDC MSMT LEADCHNL RV PACING THRESHOLD PULSEWIDTH: 0.4 ms
MDC IDC PG IMPLANT DT: 20130605
MDC IDC SET LEADCHNL RA PACING AMPLITUDE: 2 V
MDC IDC SET LEADCHNL RV PACING AMPLITUDE: 2.5 V
MDC IDC SET LEADCHNL RV SENSING SENSITIVITY: 2.8 mV
MDC IDC STAT BRADY AP VP PERCENT: 92 %
MDC IDC STAT BRADY AS VP PERCENT: 0 %
MDC IDC STAT BRADY AS VS PERCENT: 0 %

## 2016-08-29 NOTE — Progress Notes (Signed)
Pacemaker check in clinic. Normal device function. Thresholds, sensing, impedances consistent with previous measurements. Device programmed to maximize longevity. No atrial or ventricular high rates noted. Device programmed at appropriate safety margins. Histogram distribution appropriate for patient activity level. Device programmed to optimize intrinsic conduction. Estimated longevity 5.5 years. Patient will follow up with [facility] in 6 months.

## 2016-09-17 ENCOUNTER — Other Ambulatory Visit: Payer: Self-pay | Admitting: Physician Assistant

## 2016-09-17 DIAGNOSIS — I10 Essential (primary) hypertension: Secondary | ICD-10-CM

## 2016-10-11 ENCOUNTER — Telehealth: Payer: Self-pay | Admitting: Nurse Practitioner

## 2016-10-11 NOTE — Telephone Encounter (Signed)
Patient walked in with request for Dr. Acie Fredrickson to review his recent lab work and medication list from the [place]. He states Dr. Kimber Relic at the [facility] would like Dr. Elmarie Shiley input regarding patient's medications in light of recent lab work showing elevated serum Cr of 1.9 mg/dL and hgb of 10.2 L.  Patient asked that we update his medication list with the printed list that he presents. I advised him that Dr. Acie Fredrickson will review the notes and I will call him back with his advice. He verbalized understanding and agreement and thanked me for my help.

## 2016-10-11 NOTE — Telephone Encounter (Signed)
Follow up   Pt verbalized that he is calling again for RN

## 2016-10-11 NOTE — Telephone Encounter (Signed)
Left message for patient to call back.  Per Dr. Elmarie Shiley review of the information brought in, patient has normal LV function and the creatinine is not high enough to refer the patient to nephrology. We will continue to monitor. Also need to discuss why patient's Eliquis dose was lowered to 2.5 mg BID instead of 5 mg BID.

## 2016-10-11 NOTE — Telephone Encounter (Signed)
Follow up     Pt is returning [place] call

## 2016-10-11 NOTE — Telephone Encounter (Signed)
Spoke with patient and wife to advise that Dr. Acie Fredrickson has reviewed the information that he brought to the office. He verbalized understanding that his reduced kidney function does not yet warrant referral to nephrology. He states Dr. Kimber Relic at [facility] has ordered repeat blood tests for Monday 10/1. He states she reduced his eliquis to 2.5 mg BID due to the hgb of 10 mg/dL. He states he is having difficulty being able to walk up a flight of stairs without SOB. He asked if he should have a repeat cardiac cath. I advised that he will need to come in to see Dr. Acie Fredrickson if he is having those symptoms. I offered him an appointment for tomorrow but he states he is not available tomorrow; I scheduled him for Monday 10/1. He thanked me for the call.

## 2016-10-15 ENCOUNTER — Ambulatory Visit (INDEPENDENT_AMBULATORY_CARE_PROVIDER_SITE_OTHER): Payer: Federal, State, Local not specified - PPO | Admitting: Cardiovascular Disease

## 2016-10-15 ENCOUNTER — Encounter: Payer: Self-pay | Admitting: Cardiovascular Disease

## 2016-10-15 VITALS — BP 132/84 | HR 89 | Ht 70.0 in | Wt 237.6 lb

## 2016-10-15 DIAGNOSIS — I5032 Chronic diastolic (congestive) heart failure: Secondary | ICD-10-CM | POA: Diagnosis not present

## 2016-10-15 DIAGNOSIS — I25118 Atherosclerotic heart disease of native coronary artery with other forms of angina pectoris: Secondary | ICD-10-CM

## 2016-10-15 NOTE — Progress Notes (Signed)
CARDIOLOGY OFFICE NOTE  Date:  02/15/2016    Edward [name] Date of Birth: 09-19-1940 Medical Record #[id number]  PCP:  Edward Barrows, FNP    Cardiologist:  Edward [name]       Chief Complaint  Patient presents with  . Shortness of Breath    Follow up visit - seen for Edward [name]    History of Present Illness: Edward [name] is a 76 y.o. male who presents today for a follow up visit. Seen for Edward [name].   He has a history of afib s/p MAZE w/ CABG 05/2011 (LIMA-LAD, SVG-D1, SVG-D2, SVG-CFX, SVG-PDA-PL), DM, HTN, HLD, PAD s/p R-CEA &L fem-pop, TIA, & anemia. He has PPM in place - followed by Dr. Caryl [name].   Last seen back in November by Edward [name]. Noted some fatigue. Noted he likes his salt.   Seen about a week ago as a work in by [employer] for shortness of breath and wheezing. There was felt to be some component of volume overload - his diuretics were increased for a few days. He was to come back to [place] for further follow up.   [name] in today. Here with his wife. He is quite abrasive. Very hard to get any history out of him. Will not look at me. Hard to say if he is better. Weight is down. Seems more bothered by cough. Has been to the [facility] noted. Given inhaler. Cough not productive. Has chest discomfort - sounds like it is from coughing but again - he really does not wish to give me any history.   Spoke with Edward [name] here in the office - apparently this patient is not to see male providers due to past behavior.   [name]. 31, 2018  I was asked to see Edward [name] today on an urgent basis.  He was seen by Edward [name].    She asked me to see him because he was being belligerent with her .  Has lost 17 lbs with lasix 40 BID . Still complains of cough.   CT of chest in March 2017 showed small pleural effusion .  Mild COPD,  Pulmonary art. Enlargement .  Coughing up brown sputum.  Advised him to see pulmonary  - he wants to ask the [facility] to send him to the [place]  pulmonologist.   May 03, 2016:  Edward [name] is seen today for follow up of his worsening dyspnea.  Feeling much better . No CP ,    Oct. 1, 2018:   Seen for DOE ,  Still has some DOE ,  Also has some lightheadness right after he stands up .       Past Medical History:  Diagnosis Date  . Anemia    ?  . Carotid artery occlusion   . Chronic diastolic CHF (congestive heart failure) ([place]) 04/23/2012  . Claudication ([place])   . Coronary artery disease    s/p CABG 2013  . Diabetic neuropathy (HCC)    mostly feet/ legs  . Dyslipidemia   . Exertional shortness of breath   . GERD (gastroesophageal reflux disease)   . GI bleed    while on triple anticoag therapy  . HTN (hypertension)   . Hyperlipidemia   . Neuromuscular disorder (HCC)    neuropathy  . Pacemaker   . PAF (paroxysmal atrial fibrillation) (HCC)    s/p MAZE at time of CABG  . PVD (peripheral vascular disease) ([place])    R CEA 2011, prior  PTA, left fem-pop 2014  . Stroke ([place])    vs TIA  . Type II diabetes mellitus ([place])          Past Surgical History:  Procedure Laterality Date  . ABDOMINAL AORTAGRAM N/A 01/07/2012   Procedure: ABDOMINAL Maxcine Ham;  Surgeon: Angelia Mould, MD;  Location: [facility] CATH LAB;  Service: Cardiovascular;  Laterality: N/A;  . BALLOON DILATION N/A 02/17/2013   Procedure: BALLOON DILATION;  Surgeon: Jerene Bears, MD;  Location: [facility] ENDOSCOPY;  Service: Gastroenterology;  Laterality: N/A;  . CAROTID ENDARTERECTOMY Right 03/2009  . CHOLECYSTECTOMY    . CIRCUMCISION    . COLONOSCOPY WITH PROPOFOL N/A 02/17/2013   Procedure: COLONOSCOPY WITH PROPOFOL;  Surgeon: Jerene Bears, MD;  Location: [facility];  Service: Gastroenterology;  Laterality: N/A;  . CORONARY ARTERY BYPASS GRAFT  06/14/2011   Procedure: CORONARY ARTERY BYPASS GRAFTING (CABG);  Surgeon: Gaye Pollack, MD;  Location: [place];  Service: Open Heart Surgery;  Laterality: N/A;  Coronary Artery Bypass  Graft on pump times six;  utilizing internal mammary artery and right greater saphenous vein harvested endoscopically.x 5 vessels  . ELECTROPHYSIOLOGY STUDY N/A 11/05/2012   Procedure: ELECTROPHYSIOLOGY STUDY;  Surgeon: Deboraha Sprang, MD;  Location: [facility] CATH LAB;  Service: Cardiovascular;  Laterality: N/A;  . ESOPHAGOGASTRODUODENOSCOPY (EGD) WITH PROPOFOL N/A 02/17/2013   Procedure: ESOPHAGOGASTRODUODENOSCOPY (EGD) WITH PROPOFOL;  Surgeon: Jerene Bears, MD;  Location: [facility];  Service: Gastroenterology;  Laterality: N/A;  . FEMORAL-POPLITEAL BYPASS GRAFT Left 03/06/2012   Procedure: BYPASS GRAFT FEMORAL-POPLITEAL ARTERY;  Surgeon: Angelia Mould, MD;  Location: [place];  Service: Vascular;  Laterality: Left;  Left Femoral - Below Knee Popliteal Bypass Graft with Vein and Intraoperative Arteriogram.  . INSERT / REPLACE / REMOVE PACEMAKER  2013   /notes 11/05/2012-[employer]  . JOINT REPLACEMENT Left    left TKA  . KNEE ARTHROSCOPY Left    "I had 3" (11/05/2012)  . LEAD REVISION  11/05/2012   pacemaker/notes 11/05/2012  . LEAD REVISION N/A 11/05/2012   Procedure: LEAD REVISION;  Surgeon: Deboraha Sprang, MD;  Location: [facility] C[facility] LAB;  Service: Cardiovascular;  Laterality: N/A;  . MAZE  06/14/2011   Procedure: MAZE;  Surgeon: Gaye Pollack, MD;  Location: [place];  Service: Open Heart Surgery;  Laterality: N/A;  Ligate left atrial appendage  . PERMANENT PACEMAKER INSERTION N/A 06/20/2011   Procedure: PERMANENT PACEMAKER INSERTION;  Surgeon: Deboraha Sprang, MD;  Location: [facility] CATH LAB;  Service: Cardiovascular;  Laterality: N/A;  . TONSILLECTOMY AND ADENOIDECTOMY    . TOTAL KNEE ARTHROPLASTY Left      Medications:       Current Outpatient Prescriptions  Medication Sig Dispense Refill  . acetaminophen (TYLENOL) 500 MG tablet Take 500 mg by mouth every 6 (six) hours as needed for mild pain or moderate pain.     Marland Kitchen amLODipine (NORVASC) 5 MG tablet Take 1 tablet (5 mg  total) by mouth daily. 90 tablet 3  . apixaban (ELIQUIS) 5 MG TABS tablet Take 1 tablet (5 mg total) by mouth 2 (two) times daily. 180 tablet 3  . benzonatate (TESSALON) 100 MG capsule Take 100 mg by mouth 4 (four) times daily as needed for cough.    . carvedilol (COREG) 25 MG tablet TAKE 1 TABLET (25 MG TOTAL) BY MOUTH 2 (TWO) TIMES DAILY. 180 tablet 3  . diphenhydramine-acetaminophen (TYLENOL PM) 25-500 MG TABS Take 1 tablet by mouth at bedtime as needed (sleep).     Marland Kitchen  famotidine (PEPCID) 20 MG tablet Take 20 mg by mouth daily.    . furosemide (LASIX) 40 MG tablet TAKE 2 TABLETS BY MOUTH EVERY DAY 180 tablet 2  . glipiZIDE (GLUCOTROL) 5 MG tablet Take 5 mg by mouth 3 (three) times daily.     Marland Kitchen guaiFENesin (ROBITUSSIN) 100 MG/5ML SOLN Take 5 mLs by mouth every 4 (four) hours as needed for cough or to loosen phlegm.    . Iron Combinations (IRON COMPLEX PO) Take 2 capsules by mouth daily.    Marland Kitchen KLOR-CON M20 20 MEQ tablet TAKE 1 TABLET BY MOUTH EVERY DAY 90 tablet 3  . L-Methylfolate-Algae-B12-B6 (METANX) 3-90.314-2-35 MG CAPS TAKE ONE CAPSULE BY MOUTH EVERY DAY 15 capsule 0  . liraglutide (VICTOZA) 18 MG/3ML SOPN Inject 18 mg into the skin daily.    Marland Kitchen loratadine (CLARITIN) 10 MG tablet Take 10 mg by mouth daily.    Marland Kitchen losartan (COZAAR) 100 MG tablet TAKE 1 TABLET BY MOUTH EVERY DAY 90 tablet 3  . Magnesium Oxide 420 MG TABS Take 420 mg by mouth daily.    . metFORMIN (GLUCOPHAGE) 1000 MG tablet Take 1,000 mg by mouth 2 (two) times daily with a meal.    . Multiple Vitamin (MULTIVITAMIN WITH MINERALS) TABS Take 1 tablet by mouth daily.    Marland Kitchen NOVOTWIST 32G X 5 MM MISC     . omeprazole (PRILOSEC) 40 MG capsule TAKE 1 CAPSULE 30 TO 60 MINUTES BEFORE YOUR FIRST AND LAST MEALS OF THE DAY 60 capsule 2  . ONETOUCH DELICA LANCETS FINE MISC 1 each by other route daily. 1/day 250.62 100 each 3  . ONETOUCH VERIO test strip     . pravastatin (PRAVACHOL) 80 MG tablet TAKE 1 TABLET (80  MG TOTAL) BY MOUTH DAILY. 90 tablet 3  . triamcinolone cream (KENALOG) 0.1 % Apply 1 application topically as needed (rash).      No current facility-administered medications for this visit.     Allergies:      Allergies  Allergen Reactions  . Lipitor [Atorvastatin Calcium] Other (See Comments)    Reaction unknown    Social History: The patient  reports that he quit smoking about 37 years ago. His smoking use included Cigarettes. He has a 96.00 pack-year smoking history. He has never used smokeless tobacco. He reports that he drinks about 1.2 oz of alcohol per week . He reports that he does not use drugs.   Family History: The patient's family history includes Coronary artery disease in his father and mother; Deep vein thrombosis in his mother; Heart attack in his mother; Heart disease in his brother, father, mother, and sister; Hyperlipidemia in his mother and sister; Hypertension in his father, mother, and sister.   Review of Systems: Please see the history of present illness.   Otherwise, the review of systems is positive for none.   All other systems are reviewed and negative.   Physical Exam: Blood pressure 132/84, pulse 89, height 5' 10"  (1.778 m), weight 237 lb 9.6 oz (107.8 kg), SpO2 97 %.  GEN:  Well nourished, well developed in no acute distress HEENT: Normal NECK: No JVD; No carotid bruits LYMPHATICS: No lymphadenopathy CARDIAC: RR, no murmurs, rubs, gallops RESPIRATORY:  Clear to auscultation without rales, wheezing or rhonchi  ABDOMEN: Soft, non-tender, non-distended MUSCULOSKELETAL:   1 + leg edema, chronic stasis changes  SKIN: Warm and dry NEUROLOGIC:  Alert and oriented x 3     LABORATORY DATA:  EKG:  Oct. 1, 2018:  AV sequential pacing   Recent Labs       Lab Results  Component Value Date   WBC 9.5 12/07/2015   HGB 12.6 (L) 12/07/2015   HCT 39.3 12/07/2015   PLT 214 12/07/2015   GLUCOSE 182 (H) 02/06/2016   CHOL 101 12/07/2015    TRIG 142 12/07/2015   HDL 31 (L) 12/07/2015   LDLCALC 42 12/07/2015   ALT 17 12/07/2015   AST 25 12/07/2015   NA 136 02/06/2016   K 4.8 02/06/2016   CL 100 02/06/2016   CREATININE 1.76 (H) 02/06/2016   BUN 20 02/06/2016   CO2 20 02/06/2016   TSH 3.28 12/07/2015   PSA 0.59 09/20/2012   INR 0.94 02/29/2012   HGBA1C 6.9 (H) 09/22/2013   MICROALBUR 56.2 (H) 07/08/2013      BNP (last 3 results)  Recent Labs (within last 365 days)   Recent Labs  02/06/16 1124  BNP 120.2*      ProBNP (last 3 results) Recent Labs (within last 365 days)  No results for input(s): PROBNP in the last 8760 hours.     Other Studies Reviewed Today:  ECHO: 01/26/2016 - Left ventricle: The cavity size was normal. Wall thickness was increased in a pattern of moderate LVH. There was focal basal hypertrophy. Systolic function was normal. The estimated ejection fraction was in the range of 50% to 55%. - Mitral valve: Moderately calcified annulus. - Left atrium: The atrium was mildly dilated. - Right atrium: The atrium was moderately to severely dilated.  MYOVIEW: 02/25/2013 Overall Impression: This is a low risk scan with no significant change since January, 2014. There is a small/moderate area of scar at the apex. This has been noted in the past. There is no ischemia. LV Ejection Fraction: 53%. LV Wall Motion: Mild hypokinesis at the apex.  Assessment/Plan:  1. Dyspnea on exertion:   Continue to be an issue.  Currently on furosemide 80 mg in the morning with 40 mg at night. This keeps his leg edema and shortness breath  under fair control. We had a long discussion about cardiac cath and risk of progressive renal failure. His symptoms are worrisome for unstable angian  - but he denies any chest pain or tightness  I do not think we should proceed directly to cath  No angina Will get a myoviw   2. CAD: History of coronary artery disease.   Has  persistent DOE . This may be an angina equivalent. We will get a [employer].  3. CKD stage III:   His last creatinine was 1.99. At this point I do not think that he needs a nephrology consult. We we will continue to avoid nephrotoxic medications. We will continue to keep close eye on his creatinine and Lasix dose.  4. Wheezing: Has been seen at the [facility].   5. PAF -he has a permanent pacemaker. He's currently on Eliquis  2.5 mg BID. He has been on Eliquis 5 mg BID in the past but developed bright red blood per rectum.   He does not want to go back on the 5 mg dose.   He and his wife understand that he is at slightly higher risk for CVA   Current medicines are reviewed with the patient today.  The patient does not have concerns regarding medicines other than what has been noted above.  The following changes have been made:  See above.  Labs/ tests ordered today include:   No orders of the  defined types were placed in this encounter.   Will see in 3  months .     Mertie Moores, MD  10/15/2016 1:57 PM    [name] [address],  [address], [place]  [address] Pager [phone] Phone: [phone]; Fax: [phone]

## 2016-10-15 NOTE — Patient Instructions (Signed)
Medication Instructions:  Your physician recommends that you continue on your current medications as directed. Please refer to the Current Medication list given to you today.   Labwork: None Ordered   Testing/Procedures: Your physician has requested that you have a lexiscan myoview. For further information please visit [email or web address]. Please follow instruction sheet, as given.   Follow-Up: Your physician wants you to follow-up in: 3 months with Dr. Acie Fredrickson.  You will receive a reminder letter in the mail two months in advance. If you don't receive a letter, please call our office to schedule the follow-up appointment.   If you need a refill on your cardiac medications before your next appointment, please call your pharmacy.   Thank you for choosing [facility]! Christen Bame, RN [phone]

## 2016-10-16 ENCOUNTER — Telehealth (HOSPITAL_COMMUNITY): Payer: Self-pay | Admitting: *Deleted

## 2016-10-16 NOTE — Telephone Encounter (Signed)
Left message on voicemail in reference to upcoming appointment scheduled for 10/18/16. Phone number given for a call back so details instructions can be given. Edward [name]

## 2016-10-18 ENCOUNTER — Ambulatory Visit (HOSPITAL_COMMUNITY): Payer: Federal, State, Local not specified - PPO | Attending: Cardiology

## 2016-10-18 DIAGNOSIS — I25118 Atherosclerotic heart disease of native coronary artery with other forms of angina pectoris: Secondary | ICD-10-CM | POA: Insufficient documentation

## 2016-10-18 LAB — MYOCARDIAL PERFUSION IMAGING
CHL CUP NUCLEAR SRS: 13
CHL CUP NUCLEAR SSS: 14
CHL CUP RESTING HR STRESS: 80 {beats}/min
LHR: 0.48
LV sys vol: 47 mL
LVDIAVOL: 118 mL (ref 62–150)
NUC STRESS TID: 1
Peak HR: 80 {beats}/min
SDS: 1

## 2016-10-18 MED ORDER — REGADENOSON 0.4 MG/5ML IV SOLN
0.4000 mg | Freq: Once | INTRAVENOUS | Status: AC
  Administered 2016-10-18: 0.4 mg via INTRAVENOUS

## 2016-10-18 MED ORDER — TECHNETIUM TC 99M TETROFOSMIN IV KIT
32.7000 | PACK | Freq: Once | INTRAVENOUS | Status: AC | PRN
  Administered 2016-10-18: 32.7 via INTRAVENOUS
  Filled 2016-10-18: qty 33

## 2016-10-18 MED ORDER — TECHNETIUM TC 99M TETROFOSMIN IV KIT
10.6000 | PACK | Freq: Once | INTRAVENOUS | Status: AC | PRN
  Administered 2016-10-18: 10.6 via INTRAVENOUS
  Filled 2016-10-18: qty 11

## 2016-10-20 ENCOUNTER — Other Ambulatory Visit: Payer: Self-pay | Admitting: Cardiovascular Disease

## 2016-10-25 ENCOUNTER — Telehealth: Payer: Self-pay | Admitting: Cardiovascular Disease

## 2016-10-25 NOTE — Telephone Encounter (Signed)
Walk In pt Form-Sealed Envelope Dropped off placed in Dr.Nahser doc Box.

## 2016-12-14 ENCOUNTER — Telehealth: Payer: Self-pay | Admitting: Cardiovascular Disease

## 2016-12-14 NOTE — Telephone Encounter (Signed)
Walk in pt Form-pt Dropped off Lab results. Placed in [facility] Doc Box.

## 2016-12-28 ENCOUNTER — Encounter: Payer: Self-pay | Admitting: *Deleted

## 2017-01-03 ENCOUNTER — Encounter: Payer: Self-pay | Admitting: Cardiovascular Disease

## 2017-01-03 ENCOUNTER — Ambulatory Visit (INDEPENDENT_AMBULATORY_CARE_PROVIDER_SITE_OTHER): Payer: Federal, State, Local not specified - PPO | Admitting: Cardiovascular Disease

## 2017-01-03 VITALS — BP 134/82 | HR 95 | Ht 70.0 in | Wt 236.0 lb

## 2017-01-03 DIAGNOSIS — I5032 Chronic diastolic (congestive) heart failure: Secondary | ICD-10-CM | POA: Diagnosis not present

## 2017-01-03 DIAGNOSIS — I2510 Atherosclerotic heart disease of native coronary artery without angina pectoris: Secondary | ICD-10-CM

## 2017-01-03 MED ORDER — SILDENAFIL CITRATE 20 MG PO TABS: 100.0000 mg | ORAL_TABLET | ORAL | 3 refills | Status: DC | PRN

## 2017-01-03 NOTE — Patient Instructions (Addendum)
Medication Instructions:  Your physician has recommended you make the following change in your medication:  1- Sildenafil 100 mg (5 tablets of 20 mg) by mouth as needed for sexual activity.   Labwork: Your physician recommends that you return for lab work in: 6 months - BMET, LFTs and Lipids   Testing/Procedures: NONE  Follow-Up: Your physician wants you to follow-up in: 6 months with Dr. Acie Fredrickson. You will receive a reminder letter in the mail two months in advance. If you don't receive a letter, please call our office to schedule the follow-up appointment.   If you need a refill on your cardiac medications before your next appointment, please call your pharmacy.

## 2017-01-03 NOTE — Progress Notes (Signed)
CARDIOLOGY OFFICE NOTE  Date:  02/15/2016    Edward [name] Date of Birth: Sep 08, 1940 Medical Record #[id number]  PCP:  Molli Barrows, FNP    Cardiologist:  Lulubelle Simcoe       Chief Complaint  Patient presents with  . Shortness of Breath        Previous notes.  Edward [name] is a 76 y.o. male who presents today for a follow up visit. Seen for Dr. Acie Fredrickson.   He has a history of afib s/p MAZE w/ CABG 05/2011 (LIMA-LAD, SVG-D1, SVG-D2, SVG-CFX, SVG-PDA-PL), DM, HTN, HLD, PAD s/p R-CEA &L fem-pop, TIA, & anemia. He has PPM in place - followed by Dr. Caryl Comes.    Seen about a week ago as a work in by [employer], [employer] for shortness of breath and wheezing. There was felt to be some component of volume overload - his diuretics were increased for a few days. He was to come back to [place] for further follow up.   Comes in today. Here with his wife. He is quite abrasive. Very hard to get any history out of him. Will not look at me. Hard to say if he is better. Weight is down. Seems more bothered by cough. Has been to the [facility] noted. Given inhaler. Cough not productive. Has chest discomfort - sounds like it is from coughing but again - he really does not wish to give me any history.   Spoke with Dr. Acie Fredrickson here in the office - apparently this patient is not to see male providers due to past behavior.   [name]. 31, 2018  I was asked to see Edward [name] today on an urgent basis.  He was seen by Cecille Rubin.    She asked me to see him because he was being belligerent with her .  Has lost 17 lbs with lasix 40 BID . Still complains of cough.   CT of chest in March 2017 showed small pleural effusion .  Mild COPD,  Pulmonary art. Enlargement .  Coughing up brown sputum.  Advised him to see pulmonary  - he wants to ask the [facility] to send him to the [place] pulmonologist.   May 03, 2016:  Edward [name] is seen today for follow up of his worsening dyspnea.  Feeling much better . No CP ,     Oct. 1, 2018:   Seen for DOE ,  Still has some DOE ,  Also has some lightheadness right after he stands up .   January 03, 2017:  Edward [name] is seen today for follow-up visit. Is a bit more short of breath with the cold Denies any angina. Has asked for a prescription for sildenafil.      Past Medical History:  Diagnosis Date  . Anemia    ?  . Carotid artery occlusion   . Chronic diastolic CHF (congestive heart failure) ([place]) 04/23/2012  . Claudication ([place])   . Coronary artery disease    s/p CABG 2013  . Diabetic neuropathy (HCC)    mostly feet/ legs  . Dyslipidemia   . Exertional shortness of breath   . GERD (gastroesophageal reflux disease)   . GI bleed    while on triple anticoag therapy  . HTN (hypertension)   . Hyperlipidemia   . Neuromuscular disorder (HCC)    neuropathy  . Pacemaker   . PAF (paroxysmal atrial fibrillation) (HCC)    s/p MAZE at time of CABG  . PVD (peripheral vascular disease) ([place])  R CEA 2011, prior PTA, left fem-pop 2014  . Stroke ([place])    vs TIA  . Type II diabetes mellitus ([place])          Past Surgical History:  Procedure Laterality Date  . ABDOMINAL AORTAGRAM N/A 01/07/2012   Procedure: ABDOMINAL Maxcine Ham;  Surgeon: Angelia Mould, MD;  Location[facility] CATH LAB;  Service: Cardiovascular;  Laterality: N/A;  . BALLOON DILATION N/A 02/17/2013   Procedure: BALLOON DILATION;  Surgeon: Jerene Bears, MD;  Location: [facility] ENDOSCOPY;  Service: Gastroenterology;  Laterality: N/A;  . CAROTID ENDARTERECTOMY Right 03/2009  . CHOLECYSTECTOMY    . CIRCUMCISION    . COLONOSCOPY WITH PROPOFOL N/A 02/17/2013   Procedure: COLONOSCOPY WITH PROPOFOL;  Surgeon: Jerene Bears, MD;  Location: [facility] ENDOSCOPY;  Service: Gastroenterology;  Laterality: N/A;  . CORONARY ARTERY BYPASS GRAFT  06/14/2011   Procedure: CORONARY ARTERY BYPASS GRAFTING (CABG);  Surgeon: Gaye Pollack, MD;  Location: [facility];  Service: Open Heart Surgery;   Laterality: N/A;  Coronary Artery Bypass Graft on pump times six;  utilizing internal mammary artery and right greater saphenous vein harvested endoscopically.x 5 vessels  . ELECTROPHYSIOLOGY STUDY N/A 11/05/2012   Procedure: ELECTROPHYSIOLOGY STUDY;  Surgeon: Deboraha Sprang, MD;  Location: [facility] CATH LAB;  Service: Cardiovascular;  Laterality: N/A;  . ESOPHAGOGASTRODUODENOSCOPY (EGD) WITH PROPOFOL N/A 02/17/2013   Procedure: ESOPHAGOGASTRODUODENOSCOPY (EGD) WITH PROPOFOL;  Surgeon: Jerene Bears, MD;  Location: [facility] ENDOS[facility];  Service: Gastroenterology;  Laterality: N/A;  . FEMORAL-POPLITEAL BYPASS GRAFT Left 03/06/2012   Procedure: BYPASS GRAFT FEMORAL-POPLITEAL ARTERY;  Surgeon: Angelia Mould, MD;  Location: [facility];  Service: Vascular;  Laterality: Left;  Left Femoral - Below Knee Popliteal Bypass Graft with Vein and Intraoperative Arteriogram.  . INSERT / REPLACE / REMOVE PACEMAKER  2013   /notes 11/05/2012-[employer]  . JOINT REPLACEMENT Left    left TKA  . KNEE ARTHROSCOPY Left    "I had 3" (11/05/2012)  . LEAD REVISION  11/05/2012   pacemaker/notes 11/05/2012  . LEAD REVISION N/A 11/05/2012   Procedure: LEAD REVISION;  Surgeon: Deboraha Sprang, MD;  Location[facility] CATH LAB;  Service: Cardiovascular;  Laterality: N/A;  . MAZE  06/14/2011   Procedure: MAZE;  Surgeon: Gaye Pollack, MD;  Location: [facility];  Service: Open Heart Surgery;  Laterality: N/A;  [place] left atrial appendage  . PERMANENT PACEMAKER INSERTION N/A 06/20/2011   Procedure: PERMANENT PACEMAKER INSERTION;  Surgeon: Deboraha Sprang, MD;  Location: [facility] CATH LAB;  Service: Cardiovascular;  Laterality: N/A;  . TONSILLECTOMY AND ADENOIDECTOMY    . TOTAL KNEE ARTHROPLASTY Left      Medications:       Current Outpatient Prescriptions  Medication Sig Dispense Refill  . acetaminophen (TYLENOL) 500 MG tablet Take 500 mg by mouth every 6 (six) hours as needed for mild pain or moderate pain.     Marland Kitchen amLODipine  (NORVASC) 5 MG tablet Take 1 tablet (5 mg total) by mouth daily. 90 tablet 3  . apixaban (ELIQUIS) 5 MG TABS tablet Take 1 tablet (5 mg total) by mouth 2 (two) times daily. 180 tablet 3  . benzonatate (TESSALON) 100 MG capsule Take 100 mg by mouth 4 (four) times daily as needed for cough.    . carvedilol (COREG) 25 MG tablet TAKE 1 TABLET (25 MG TOTAL) BY MOUTH 2 (TWO) TIMES DAILY. 180 tablet 3  . diphenhydramine-acetaminophen (TYLENOL PM) 25-500 MG TABS Take 1 tablet by mouth at bedtime as  needed (sleep).     . famotidine (PEPCID) 20 MG tablet Take 20 mg by mouth daily.    . furosemide (LASIX) 40 MG tablet TAKE 2 TABLETS BY MOUTH EVERY DAY 180 tablet 2  . glipiZIDE (GLUCOTROL) 5 MG tablet Take 5 mg by mouth 3 (three) times daily.     Marland Kitchen guaiFENesin (ROBITUSSIN) 100 MG/5ML SOLN Take 5 mLs by mouth every 4 (four) hours as needed for cough or to loosen phlegm.    . Iron Combinations (IRON COMPLEX PO) Take 2 capsules by mouth daily.    Marland Kitchen KLOR-CON M20 20 MEQ tablet TAKE 1 TABLET BY MOUTH EVERY DAY 90 tablet 3  . L-Methylfolate-Algae-B12-B6 (METANX) 3-90.314-2-35 MG CAPS TAKE ONE CAPSULE BY MOUTH EVERY DAY 15 capsule 0  . liraglutide (VICTOZA) 18 MG/3ML SOPN Inject 18 mg into the skin daily.    Marland Kitchen loratadine (CLARITIN) 10 MG tablet Take 10 mg by mouth daily.    Marland Kitchen losartan (COZAAR) 100 MG tablet TAKE 1 TABLET BY MOUTH EVERY DAY 90 tablet 3  . Magnesium Oxide 420 MG TABS Take 420 mg by mouth daily.    . metFORMIN (GLUCOPHAGE) 1000 MG tablet Take 1,000 mg by mouth 2 (two) times daily with a meal.    . Multiple Vitamin (MULTIVITAMIN WITH MINERALS) TABS Take 1 tablet by mouth daily.    Marland Kitchen NOVOTWIST 32G X 5 MM MISC     . omeprazole (PRILOSEC) 40 MG capsule TAKE 1 CAPSULE 30 TO 60 MINUTES BEFORE YOUR FIRST AND LAST MEALS OF THE DAY 60 capsule 2  . ONETOUCH DELICA LANCETS FINE MISC 1 each by other route daily. 1/day 250.62 100 each 3  . ONETOUCH VERIO test strip     . pravastatin  (PRAVACHOL) 80 MG tablet TAKE 1 TABLET (80 MG TOTAL) BY MOUTH DAILY. 90 tablet 3  . triamcinolone cream (KENALOG) 0.1 % Apply 1 application topically as needed (rash).      No current facility-administered medications for this visit.     Allergies:      Allergies  Allergen Reactions  . Lipitor [Atorvastatin Calcium] Other (See Comments)    Reaction unknown    Social History: The patient  reports that he quit smoking about 37 years ago. His smoking use included Cigarettes. He has a 96.00 pack-year smoking history. He has never used smokeless tobacco. He reports that he drinks about 1.2 oz of alcohol per week . He reports that he does not use drugs.   Family History: The patient's family history includes Coronary artery disease in his father and mother; Deep vein thrombosis in his mother; Heart attack in his mother; Heart disease in his brother, father, mother, and sister; Hyperlipidemia in his mother and sister; Hypertension in his father, mother, and sister.   Review of Systems: Please see the history of present illness.   Otherwise, the review of systems is positive for none.   All other systems are reviewed and negative.   Physical Exam: Blood pressure 134/82, pulse 95, height 5' 10"  (1.778 m), weight 236 lb (107 kg), SpO2 92 %.  GEN:  Well nourished, well developed in no acute distress HEENT: Normal NECK: No JVD; No carotid bruits LYMPHATICS: No lymphadenopathy CARDIAC: RR, no murmurs, rubs, gallops RESPIRATORY:  Clear to auscultation without rales, wheezing or rhonchi  ABDOMEN: Soft, non-tender, non-distended MUSCULOSKELETAL:  No edema; No deformity  SKIN: Warm and dry NEUROLOGIC:  Alert and oriented x 3   LABORATORY DATA:  EKG:     Recent  Labs       Lab Results  Component Value Date   WBC 9.5 12/07/2015   HGB 12.6 (L) 12/07/2015   HCT 39.3 12/07/2015   PLT 214 12/07/2015   GLUCOSE 182 (H) 02/06/2016   CHOL 101 12/07/2015   TRIG 142  12/07/2015   HDL 31 (L) 12/07/2015   LDLCALC 42 12/07/2015   ALT 17 12/07/2015   AST 25 12/07/2015   NA 136 02/06/2016   K 4.8 02/06/2016   CL 100 02/06/2016   CREATININE 1.76 (H) 02/06/2016   BUN 20 02/06/2016   CO2 20 02/06/2016   TSH 3.28 12/07/2015   PSA 0.59 09/20/2012   INR 0.94 02/29/2012   HGBA1C 6.9 (H) 09/22/2013   MICROALBUR 56.2 (H) 07/08/2013      BNP (last 3 results)  Recent Labs (within last 365 days)   Recent Labs  02/06/16 1124  BNP 120.2*      ProBNP (last 3 results) Recent Labs (within last 365 days)  No results for input(s): PROBNP in the last 8760 hours.     Other Studies Reviewed Today:  ECHO: 01/26/2016 - Left ventricle: The cavity size was normal. Wall thickness was increased in a pattern of moderate LVH. There was focal basal hypertrophy. Systolic function was normal. The estimated ejection fraction was in the range of 50% to 55%. - Mitral valve: Moderately calcified annulus. - Left atrium: The atrium was mildly dilated. - Right atrium: The atrium was moderately to severely dilated.  MYOVIEW: 02/25/2013 Overall Impression: This is a low risk scan with no significant change since January, 2014. There is a small/moderate area of scar at the apex. This has been noted in the past. There is no ischemia. LV Ejection Fraction: 53%. LV Wall Motion: Mild hypokinesis at the apex.  Assessment/Plan:  1. Dyspnea on exertion:   Continue to be an issue.   Has chronic diastolic CHF.   Seems to be stable  Currently on furosemide 80 mg in the morning with 40 mg at night.   2. CAD: No angina. Check fasting labs when I see him again in 6 months.  3. CKD stage III:    Renal function is followed at the [place].  4. Wheezing:    5. PAF continue low-dose Eliquis.  6.  Erectile dysfunction: We will give him a prescription for sildenafil 20 mg tablets-#50.  We will print this prescription so he can shop around for  the best price.   Current medicines are reviewed with the patient today.  The patient does not have concerns regarding medicines other than what has been noted above.  The following changes have been made:  See above.  Labs/ tests ordered today include:   No orders of the defined types were placed in this encounter.       Mertie Moores, MD  01/03/2017 3:22 PM    [facility] [place],  [address] [place], [place]  [address] Pager [phone] Phone: [phone]; Fax: [phone]

## 2017-02-12 ENCOUNTER — Encounter (HOSPITAL_COMMUNITY): Payer: Self-pay | Admitting: Emergency Medicine

## 2017-02-12 ENCOUNTER — Emergency Department (HOSPITAL_COMMUNITY): Payer: Federal, State, Local not specified - PPO

## 2017-02-12 ENCOUNTER — Emergency Department (HOSPITAL_COMMUNITY)
Admission: EM | Admit: 2017-02-12 | Discharge: 2017-02-13 | Disposition: A | Discharge status: Home / Self Care | Payer: Federal, State, Local not specified - PPO | Attending: Emergency Medicine | Admitting: Emergency Medicine

## 2017-02-12 ENCOUNTER — Other Ambulatory Visit: Payer: Self-pay

## 2017-02-12 DIAGNOSIS — Z7901 Long term (current) use of anticoagulants: Secondary | ICD-10-CM | POA: Insufficient documentation

## 2017-02-12 DIAGNOSIS — E1140 Type 2 diabetes mellitus with diabetic neuropathy, unspecified: Secondary | ICD-10-CM | POA: Diagnosis not present

## 2017-02-12 DIAGNOSIS — J111 Influenza due to unidentified influenza virus with other respiratory manifestations: Secondary | ICD-10-CM | POA: Diagnosis not present

## 2017-02-12 DIAGNOSIS — Z79899 Other long term (current) drug therapy: Secondary | ICD-10-CM | POA: Insufficient documentation

## 2017-02-12 DIAGNOSIS — I110 Hypertensive heart disease with heart failure: Secondary | ICD-10-CM | POA: Insufficient documentation

## 2017-02-12 DIAGNOSIS — Z87891 Personal history of nicotine dependence: Secondary | ICD-10-CM | POA: Diagnosis not present

## 2017-02-12 DIAGNOSIS — I2510 Atherosclerotic heart disease of native coronary artery without angina pectoris: Secondary | ICD-10-CM | POA: Diagnosis not present

## 2017-02-12 DIAGNOSIS — Z950 Presence of cardiac pacemaker: Secondary | ICD-10-CM | POA: Diagnosis not present

## 2017-02-12 DIAGNOSIS — R0602 Shortness of breath: Secondary | ICD-10-CM

## 2017-02-12 DIAGNOSIS — Z7984 Long term (current) use of oral hypoglycemic drugs: Secondary | ICD-10-CM | POA: Insufficient documentation

## 2017-02-12 DIAGNOSIS — Z951 Presence of aortocoronary bypass graft: Secondary | ICD-10-CM | POA: Insufficient documentation

## 2017-02-12 DIAGNOSIS — I5032 Chronic diastolic (congestive) heart failure: Secondary | ICD-10-CM | POA: Diagnosis not present

## 2017-02-12 DIAGNOSIS — Z96652 Presence of left artificial knee joint: Secondary | ICD-10-CM | POA: Insufficient documentation

## 2017-02-12 DIAGNOSIS — R69 Illness, unspecified: Secondary | ICD-10-CM

## 2017-02-12 LAB — BASIC METABOLIC PANEL
Anion gap: 17 — ABNORMAL HIGH (ref 5–15)
BUN: 30 mg/dL — ABNORMAL HIGH (ref 6–20)
CHLORIDE: 98 mmol/L — AB (ref 101–111)
CO2: 18 mmol/L — AB (ref 22–32)
CREATININE: 2.19 mg/dL — AB (ref 0.61–1.24)
Calcium: 9.2 mg/dL (ref 8.9–10.3)
GFR calc non Af Amer: 28 mL/min — ABNORMAL LOW (ref >60)
GFR, EST AFRICAN AMERICAN: 32 mL/min — AB (ref >60)
Glucose, Bld: 217 mg/dL — ABNORMAL HIGH (ref 65–99)
POTASSIUM: 4.1 mmol/L (ref 3.5–5.1)
SODIUM: 133 mmol/L — AB (ref 135–145)

## 2017-02-12 LAB — CBC
HEMATOCRIT: 38.5 % — AB (ref 39.0–52.0)
HEMOGLOBIN: 12.8 g/dL — AB (ref 13.0–17.0)
MCH: 25.7 pg — AB (ref 26.0–34.0)
MCHC: 33.2 g/dL (ref 30.0–36.0)
MCV: 77.3 fL — AB (ref 78.0–100.0)
PLATELETS: 179 10*3/uL (ref 150–400)
RBC: 4.98 MIL/uL (ref 4.22–5.81)
RDW: 16.2 % — ABNORMAL HIGH (ref 11.5–15.5)
WBC: 14.4 10*3/uL — AB (ref 4.0–10.5)

## 2017-02-12 LAB — I-STAT TROPONIN, ED
TROPONIN I, POC: 0.02 ng/mL (ref 0.00–0.08)
Troponin i, poc: 0.02 ng/mL (ref 0.00–0.08)

## 2017-02-12 LAB — BRAIN NATRIURETIC PEPTIDE: B NATRIURETIC PEPTIDE 5: 73 pg/mL (ref 0.0–100.0)

## 2017-02-12 LAB — D-DIMER, QUANTITATIVE (NOT AT ARMC): D DIMER QUANT: 0.6 ug{FEU}/mL — AB (ref 0.00–0.50)

## 2017-02-12 MED ORDER — ACETAMINOPHEN 500 MG PO TABS
1000.0000 mg | ORAL_TABLET | Freq: Once | ORAL | Status: AC
Start: 2017-02-12 — End: 2017-02-12
  Administered 2017-02-12: 1000 mg via ORAL
  Filled 2017-02-12: qty 2

## 2017-02-12 MED ORDER — HYDROCODONE-HOMATROPINE 5-1.5 MG/5ML PO SYRP: 5.0000 mL | ORAL_SOLUTION | Freq: Four times a day (QID) | ORAL | 0 refills | Status: DC | PRN

## 2017-02-12 MED ORDER — OSELTAMIVIR PHOSPHATE 75 MG PO CAPS: 75.0000 mg | ORAL_CAPSULE | Freq: Two times a day (BID) | ORAL | 0 refills | Status: DC

## 2017-02-12 MED ORDER — HYDROCODONE-HOMATROPINE 5-1.5 MG/5ML PO SYRP
5.0000 mL | ORAL_SOLUTION | Freq: Once | ORAL | Status: AC
  Administered 2017-02-12: 5 mL via ORAL
  Filled 2017-02-12: qty 5

## 2017-02-12 MED ORDER — IPRATROPIUM-ALBUTEROL 0.5-2.5 (3) MG/3ML IN SOLN
3.0000 mL | Freq: Once | RESPIRATORY_TRACT | Status: AC
  Administered 2017-02-12: 3 mL via RESPIRATORY_TRACT
  Filled 2017-02-12: qty 3

## 2017-02-12 MED ORDER — IPRATROPIUM BROMIDE HFA 17 MCG/ACT IN AERS
2.0000 | INHALATION_SPRAY | Freq: Once | RESPIRATORY_TRACT | Status: AC
  Administered 2017-02-13: 2 via RESPIRATORY_TRACT
  Filled 2017-02-12: qty 12.9

## 2017-02-12 MED ORDER — ALBUTEROL SULFATE (2.5 MG/3ML) 0.083% IN NEBU
5.0000 mg | INHALATION_SOLUTION | Freq: Once | RESPIRATORY_TRACT | Status: AC
  Administered 2017-02-12: 5 mg via RESPIRATORY_TRACT
  Filled 2017-02-12: qty 6

## 2017-02-12 MED ORDER — IPRATROPIUM BROMIDE HFA 17 MCG/ACT IN AERS: 2.0000 | INHALATION_SPRAY | RESPIRATORY_TRACT | 12 refills | Status: DC | PRN

## 2017-02-12 NOTE — ED Provider Notes (Signed)
[place] EMERGENCY DEPARTMENT Provider Note   CSN: [id number] Arrival date & time: 02/12/17  1856     History   Chief Complaint Chief Complaint  Patient presents with  . Shortness of Breath    HPI Edward [name] is a 77 y.o. male with history of CAD status post CABG, atrial fibrillation s/p maze, diabetes on glipizide, hypertension, hyperlipidemia, peripheral artery disease with stents, anemia, pacemaker, heart failure and chronic DOE, here for evaluation of fever, wheezing, chest tightness, dry cough, shortness of breath for 2 days. Feels like he has mucus to cough up but nothing is coming up with coughing. Prodromal symptoms of nasal congestion, rhinorrhea, sinus pressure, headache a few days before cough started. Went to the [facility] hospital yesterday, discharged with albuterol inhaler. Has taken Mucinex and inhaler with minimal relief. Chest tightness and shortness of breath are worse with coughing fits, not exertional and not associated with chest pain, lightheadedness, dizziness, palpitations, nausea, vomiting, diaphoresis. Has history of heart failure but states this does not feel like his exertional shortness of breath at baseline. Denies history of COPD, remote tobacco abuse. Chronic lower extremity edema, unchanged and symmetric. No history of DVT/PE, recent immobilization, surgeries, estrogen use, malignancy.  HPI  Past Medical History:  Diagnosis Date  . Anemia    ?  . Carotid artery occlusion   . Chronic diastolic CHF (congestive heart failure) ([place]) 04/23/2012  . Claudication ([place])   . Coronary artery disease    s/p CABG 2013  . Diabetic neuropathy (HCC)    mostly feet/ legs  . Dyslipidemia   . Exertional shortness of breath   . GERD (gastroesophageal reflux disease)   . GI bleed    while on triple anticoag therapy  . HTN (hypertension)   . Hyperlipidemia   . Neuromuscular disorder (HCC)    neuropathy  . Pacemaker   . PAF (paroxysmal atrial  fibrillation) (HCC)    s/p MAZE at time of CABG  . PVD (peripheral vascular disease) ([place])    R CEA 2011, prior PTA, left fem-pop 2014  . Stroke ([place])    vs TIA  . Type II diabetes mellitus [facility])     Patient Active Problem List   Diagnosis Date Noted  . Cough 02/15/2016  . Atelectasis/ subsegmental LLL ant basal segment  03/12/2015  . Morbid obesity ([place]) 03/12/2015  . Upper airway cough syndrome 03/11/2015  . PVD (peripheral vascular disease) ([place]) 08/26/2013  . Benign neoplasm of colon 02/17/2013  . 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 Anemia, iron deficiency 11/22/2012  . Dyspnea 11/21/2012  . Pacemaker lead malfunction 09/23/2012  . Aftercare following surgery of the circulatory system, [place] 09/10/2012  . Chronic diastolic CHF (congestive heart failure) ([place]) 04/23/2012  . Bilateral lower extremity edema 04/14/2012  . PVD (peripheral vascular disease) with claudication ([place]) 03/26/2012  . Peripheral vascular disease, unspecified ([place]) 02/13/2012  . Pacemaker-Medtronic 06/21/2011  . Accelerated junctional rhythm 06/21/2011  . Coronary artery disease 06/21/2011  . S/P Maze operation for atrial fibrillation 06/21/2011  . Sick sinus syndrome ([place]) 06/19/2011  . HTN (hypertension) 05/31/2011  . LBBB (left bundle branch block) 11/15/2010  . Hyperlipidemia 05/30/2010  . Atherosclerosis of renal artery ([place]) 05/19/2010  . PAF (paroxysmal atrial fibrillation) ([place]) 04/18/2010  . Dyspnea on exertion 04/18/2010  . Diabetes mellitus ([place]) 04/18/2010  . Atherosclerosis of native arteries of the extremities with intermittent claudication 04/18/2010  . Anemia 04/18/2010  . PAIN IN SOFT TISSUES OF LIMB 03/31/2010  .  CAROTID ARTERY DISEASE 03/30/2010    Past Surgical History:  Procedure Laterality Date  . ABDOMINAL AORTAGRAM N/A 01/07/2012   Procedure: ABDOMINAL Maxcine Ham;  Surgeon: Angelia Mould, MD;  Location: [facility] CATH LAB;  Service: Cardiovascular;  Laterality: N/A;  . BALLOON  DILATION N/A 02/17/2013   Procedure: BALLOON DILATION;  Surgeon: Jerene Bears, MD;  Location: [facility] ENDOSCOPY;  Service: Gastroenterology;  Laterality: N/A;  . CAROTID ENDARTERECTOMY Right 03/2009  . CHOLECYSTECTOMY    . CIRCUMCISION    . COLONOSCOPY WITH PROPOFOL N/A 02/17/2013   Procedure: COLONOSCOPY WITH PROPOFOL;  Surgeon: Jerene Bears, MD;  Location: [facility] ENDOSCOPY;  Service: Gastroenterology;  Laterality: N/A;  . CORONARY ARTERY BYPASS GRAFT  06/14/2011   Procedure: CORONARY ARTERY BYPASS GRAFTING (CABG);  Surgeon: Gaye Pollack, MD;  Location: [place];  Service: Open Heart Surgery;  Laterality: N/A;  Coronary Artery Bypass Graft on pump times six;  utilizing internal mammary artery and right greater saphenous vein harvested endoscopically.x 5 vessels  . ELECTROPHYSIOLOGY STUDY N/A 11/05/2012   Procedure: ELECTROPHYSIOLOGY STUDY;  Surgeon: Deboraha Sprang, MD;  Location[facility] CATH LAB;  Service: Cardiovascular;  Laterality: N/A;  . ESOPHAGOGASTRODUODENOSCOPY (EGD) WITH PROPOFOL N/A 02/17/2013   Procedure: ESOPHAGOGASTRODUODENOSCOPY (EGD) WITH PROPOFOL;  Surgeon: Jerene Bears, MD;  Location: [facility] ENDOSCOPY;  Service: Gastroenterology;  Laterality: N/A;  . FEMORAL-POPLITEAL BYPASS GRAFT Left 03/06/2012   Procedure: BYPASS GRAFT FEMORAL-POPLITEAL ARTERY;  Surgeon: Angelia Mould, MD;  Location: [place];  Service: Vascular;  Laterality: Left;  Left Femoral - Below Knee Popliteal Bypass Graft with Vein and Intraoperative Arteriogram.  . INSERT / REPLACE / REMOVE PACEMAKER  2013   /notes 11/05/2012-Medtronic  . JOINT REPLACEMENT Left    left TKA  . KNEE ARTHROSCOPY Left    "I had 3" (11/05/2012)  . LEAD REVISION  11/05/2012   pacemaker/notes 11/05/2012  . LEAD REVISION N/A 11/05/2012   Procedure: LEAD REVISION;  Surgeon: Deboraha Sprang, MD;  Location: [facility] CATH LAB;  Service: Cardiovascular;  Laterality: N/A;  . MAZE  06/14/2011   Procedure: MAZE;  Surgeon: Gaye Pollack, MD;  Location: [place];  Service:  Open Heart Surgery;  Laterality: N/A;  [place] left atrial appendage  . PERMANENT PACEMAKER INSERTION N/A 06/20/2011   Procedure: PERMANENT PACEMAKER INSERTION;  Surgeon: Deboraha Sprang, MD;  Location: [facility] CATH LAB;  Service: Cardiovascular;  Laterality: N/A;  . TONSILLECTOMY AND ADENOIDECTOMY    . TOTAL KNEE ARTHROPLASTY Left        Home Medications    Prior to Admission medications   Medication Sig Start Date End Date Taking? Authorizing Provider  amLODipine (NORVASC) 5 MG tablet TAKE 1 TABLET BY MOUTH EVERY DAY 09/19/16  Yes Dunn, Dayna N, PA-C  apixaban (ELIQUIS) 2.5 MG TABS tablet Take 2.5 mg by mouth 2 (two) times daily.   Yes [provider]  carvedilol (COREG) 25 MG tablet Take 1 tablet (25 mg total) by mouth 2 (two) times daily with a meal. 02/16/16  Yes Nahser, Wonda Cheng, MD  famotidine (PEPCID) 20 MG tablet Take 20 mg by mouth daily.   Yes [provider]  furosemide (LASIX) 40 MG tablet Take 2 tablets (80 mg) in the morning and 1 tablet (40 mg) in the afternoon Patient taking differently: Take 40-80 mg by mouth See admin instructions. Take 2 tablets (80 mg) in the morning and 1 tablet (40 mg) in the afternoon 07/02/16  Yes Nahser, Wonda Cheng, MD  gabapentin (NEURONTIN) 300  MG capsule Take 300-900 mg by mouth See admin instructions. Take 300 mg in the morning and 900 mg in the evening   Yes [provider]  glipiZIDE (GLUCOTROL) 5 MG tablet Take 5-10 mg by mouth See admin instructions. Take 1 tablet every morning and take 2 tablets every evening   Yes [provider]  Iron Combinations (IRON COMPLEX PO) Take 2 capsules by mouth daily.   Yes [provider]  KLOR-CON M20 20 MEQ tablet TAKE 1 TABLET BY MOUTH EVERY DAY 10/22/16  Yes Nahser, Wonda Cheng, MD  loratadine (CLARITIN) 10 MG tablet Take 10 mg by mouth daily.   Yes [provider]  losartan (COZAAR) 100 MG tablet TAKE 1 TABLET BY MOUTH EVERY DAY 10/22/16  Yes Nahser, Wonda Cheng, MD    Magnesium Oxide 400 MG CAPS Take 800 mg by mouth every evening.   Yes [provider]  metFORMIN (GLUCOPHAGE) 500 MG tablet Take 500 mg by mouth 2 (two) times daily with a meal.   Yes [provider]  Multiple Vitamin (MULTIVITAMIN WITH MINERALS) TABS Take 1 tablet by mouth daily.   Yes [provider]  omeprazole (PRILOSEC) 20 MG capsule Take 20 mg by mouth 2 (two) times daily before a meal.   Yes [provider]  pravastatin (PRAVACHOL) 80 MG tablet TAKE 1 TABLET (80 MG TOTAL) BY MOUTH DAILY. 10/22/16  Yes Nahser, Wonda Cheng, MD  sildenafil (REVATIO) 20 MG tablet Take 5 tablets (100 mg total) by mouth as needed (for sexual activity). 01/03/17  Yes Nahser, Wonda Cheng, MD  HYDROcodone-homatropine [facility]) 5-1.5 MG/5ML syrup Take 5 mLs by mouth every 6 (six) hours as needed for cough. FOR DISRUPTIVE NIGHT TIME COUGH AND BODY ACHES 02/12/17   Kinnie Feil, PA-C  ipratropium (ATROVENT HFA) 17 MCG/ACT inhaler Inhale 2 puffs into the lungs every 4 (four) hours as needed for wheezing. 02/12/17   Kinnie Feil, PA-C  oseltamivir (TAMIFLU) 75 MG capsule Take 1 capsule (75 mg total) by mouth every 12 (twelve) hours. 02/12/17   Kinnie Feil, PA-C    Family History Family History  Problem Relation Age of Onset  . Coronary artery disease Father   . Hypertension Father   . Heart disease Father        Heart Disease before age 13  . Heart attack Mother   . Coronary artery disease Mother   . Deep vein thrombosis Mother   . Heart disease Mother        Heart Disease before age 19  . Hyperlipidemia Mother   . Hypertension Mother   . Hypertension Sister   . Heart disease Sister        Heart Disease before age 31  . Hyperlipidemia Sister   . Heart disease Brother        Heart Disease before age 33  . Anesthesia problems Neg Hx   . Diabetes Neg Hx   . Colon cancer Neg Hx     Social History Social History   Tobacco Use  . Smoking status: Former Smoker     Packs/day: 3.00    Years: 32.00    Pack years: 96.00    Types: Cigarettes    Last attempt to quit: 01/16/1979    Years since quitting: 38.1  . Smokeless tobacco: Never Used  Substance Use Topics  . Alcohol use: Yes    Alcohol/week: 1.2 oz    Types: 2 Glasses of wine per week    Comment:  beer 1-2 weekly (occasional)  . Drug use: No     Allergies   Lipitor [atorvastatin calcium]   Review of Systems Review of Systems  Constitutional: Positive for fever.  Respiratory: Positive for cough, chest tightness, shortness of breath and wheezing.   Allergic/Immunologic: Positive for immunocompromised state (age, h/o DM).  All other systems reviewed and are negative.    Physical Exam Updated Vital Signs BP 117/68   Pulse 69   Temp 98.9 F (37.2 C) (Oral)   Resp (!) 9   Ht 5' 10"  (1.778 m)   Wt 107 kg (236 lb)   SpO2 96%   BMI 33.86 kg/m   Physical Exam  Constitutional: He is oriented to person, place, and time. He appears well-developed and well-nourished. No distress.  Coughing frequently.   HENT:  Head: Normocephalic and atraumatic.  Nose: Nose normal.  Mouth/Throat: No oropharyngeal exudate.  Moist mucous membranes. Oropharynx and tonsils normal.   Eyes: Conjunctivae and EOM are normal. Pupils are equal, round, and reactive to light.  Neck: Normal range of motion.  No cervical adenopathy.   Cardiovascular: Normal rate, regular rhythm, normal heart sounds and intact distal pulses.  No murmur heard. 1+ edema from knees to ankles, symmetric bilaterally. No calf tenderness. 2+ DP and radial pulses bilaterally.   Pulmonary/Chest: Effort normal. He has wheezes in the right upper field, the right middle field, the right lower field, the left upper field, the left middle field and the left lower field.  Expiratory wheezing in all lung fields. No rales. No decreased breath sounds. Deep breaths causes coughing fits. 94-96% in RA.   Abdominal: Soft. Bowel sounds are normal.  There is no tenderness.  No G/R/R. No suprapubic or CVA tenderness.   Musculoskeletal: Normal range of motion. He exhibits no deformity.       Right lower leg: He exhibits edema.       Left lower leg: He exhibits edema.  Neurological: He is alert and oriented to person, place, and time.  Skin: Skin is warm and dry. Capillary refill takes less than 2 seconds.  Psychiatric: He has a normal mood and affect. His behavior is normal. Judgment and thought content normal.  Nursing note and vitals reviewed.    ED Treatments / Results  Labs (all labs ordered are listed, but only abnormal results are displayed) Labs Reviewed  BASIC METABOLIC PANEL - Abnormal; Notable for the following components:      Result Value   Sodium 133 (*)    Chloride 98 (*)    CO2 18 (*)    Glucose, Bld 217 (*)    BUN 30 (*)    Creatinine, Ser 2.19 (*)    GFR calc non Af Amer 28 (*)    GFR calc Af Amer 32 (*)    Anion gap 17 (*)    All other components within normal limits  CBC - Abnormal; Notable for the following components:   WBC 14.4 (*)    Hemoglobin 12.8 (*)    HCT 38.5 (*)    MCV 77.3 (*)    MCH 25.7 (*)    RDW 16.2 (*)    All other components within normal limits  D-DIMER, QUANTITATIVE (NOT AT [facility]) - Abnormal; Notable for the following components:   D-Dimer, Quant 0.60 (*)    All other components within normal limits  BRAIN NATRIURETIC PEPTIDE  URINALYSIS, ROUTINE W REFLEX MICROSCOPIC  I-STAT TROPONIN, ED  I-STAT TROPONIN, ED    EKG  EKG  Interpretation  Date/Time:  Tuesday February 12 2017 18:59:56 EST Ventricular Rate:  83 PR Interval:    QRS Duration: 172 QT Interval:  426 QTC Calculation: 500 R Axis:   -84 Text Interpretation:  AV sequential or dual chamber electronic pacemaker Confirmed by Davonna Belling [phone]) on 02/12/2017 11:02:10 PM       Radiology Dg Chest 2 View  Result Date: 02/12/2017 CLINICAL DATA:  Pt c/o SOB, cough, and wheezing x 1 week. Hx of CHF, CAD, HTN,  pacemaker placement, and DM. Pt is a former smoker. EXAM: CHEST  2 VIEW COMPARISON:  03/28/2015 FINDINGS: Stable changes from prior cardiac surgery. Cardiac silhouette is top-normal in size. No mediastinal or hilar masses. No convincing adenopathy. Left anterior chest wall sequential pacemaker is stable and well positioned. Mild prominence of the bronchovascular markings mostly in the lower lungs, stable. No evidence of pneumonia or pulmonary edema. No pleural effusion or pneumothorax. Skeletal structures are demineralized but intact. IMPRESSION: No acute cardiopulmonary disease. Electronically Signed   By: Lajean Manes M.D.   On: 02/12/2017 20:29    Procedures Procedures (including critical care time)  Medications Ordered in ED Medications  ipratropium (ATROVENT HFA) inhaler 2 puff (not administered)  albuterol (PROVENTIL) (2.5 MG/3ML) 0.083% nebulizer solution 5 mg (5 mg Nebulization Given 02/12/17 1908)  ipratropium-albuterol (DUONEB) 0.5-2.5 (3) MG/3ML nebulizer solution 3 mL (3 mLs Nebulization Given 02/12/17 2149)  HYDROcodone-homatropine (HYCODAN) 5-1.5 MG/5ML syrup 5 mL (5 mLs Oral Given 02/12/17 2246)  acetaminophen (TYLENOL) tablet 1,000 mg (1,000 mg Oral Given 02/12/17 2246)  ipratropium-albuterol (DUONEB) 0.5-2.5 (3) MG/3ML nebulizer solution 3 mL (3 mLs Nebulization Given 02/12/17 2311)  ipratropium-albuterol (DUONEB) 0.5-2.5 (3) MG/3ML nebulizer solution 3 mL (3 mLs Nebulization Given 02/12/17 2338)     Initial Impression / Assessment and Plan / ED Course  I have reviewed the triage vital signs and the nursing notes.  Pertinent labs & imaging results that were available during my care of the patient were reviewed by me and considered in my medical decision making (see chart for details).  Clinical Course as of Jan 30 0001  Tue Feb 12, 2017  2108 WBC: (!) 14.4 [CG]  2108 Creatinine: (!) 2.19 [CG]  2108 GFR, Est Non African American: (!) 28 [CG]  2108 Anion gap: (!) 17 [CG]  2108  Glucose: (!) 217 [CG]  2108 Potassium: 4.1 [CG]  2300 SpO2: 90 % [CG]  2300 Resp: (!) 22 [CG]  2302 WBC: (!) 14.4 [CG]  2322 Re-evaluate pt after duoneb x 2 partial improvement to wheezing, mostly at lower lobes now. He ambulated without hypoxia or symptoms. Discussed likely viral syndrome/influenza.  [CG]  2350 Re-evaluated pt after third duoneb, he feels much better. Wife thinks he looks better. Cough has decreased. Persistent wheezing to lower lobes anteriorly, but much improved. Will d/c. Pt and wife agreeable.   [CG]    Clinical Course User Index [CG] Kinnie Feil, PA-C   Likely viral syndrome/influenza given fever, rhinorrhea, cough, wheezing. Initially patient tachypneic with oxygen saturation is 90% however this improved on re-check and remained WNL after breathing treatments.  Patient ambulated without symptoms at 92-96% RA, he has DOE at baseline documented by cardiology and reported by patient, this is likely his baseline.   Mild leukocytosis WBC 14.4. Chest x-ray without edema or infiltrates.  Creatinine 2.19, patient reports baseline is around 3 but no recent creatinine in the last year to compare. He is followed by nephrology at [facility]. Age corrected d-dimer negative. No  significant anemia. Trop and BNP normal. Will start breathing treatments and reassess.   Final Clinical Impressions(s) / ED Diagnoses   Pt received duoneb x 3 with improvement in wheezing and symptoms.  Will d/c with albuterol, atrovent, tamiflu, and close f/u in 48 hours.  Discussed specific return precautions. He is aware he is at risk for complications of influenza given age and comorbidities. He appears dependable to f/u as needed. Pt shared with SP.  Final diagnoses:  Shortness of breath  Influenza-like illness    ED Discharge Orders        Ordered    ipratropium (ATROVENT HFA) 17 MCG/ACT inhaler  Every 4 hours PRN     02/12/17 2355    oseltamivir (TAMIFLU) 75 MG capsule  Every 12 hours     02/12/17  2355    HYDROcodone-homatropine (HYCODAN) 5-1.5 MG/5ML syrup  Every 6 hours PRN     02/12/17 2355       Kinnie Feil, PA-C 02/13/17 0001    Davonna Belling, MD 02/13/17 [phone]

## 2017-02-12 NOTE — ED Notes (Signed)
Pt. Ambulated from B17 to [place]. SpO2 range from 92%-96%. Pt. Denied dizziness or SOB while ambulating.

## 2017-02-12 NOTE — ED Notes (Signed)
Pt still has mild wheezing noted, pt placed in waiting area, NF aware of pt

## 2017-02-12 NOTE — Discharge Instructions (Signed)
Your lab work, chest x-ray, EKG, heart enzymes were ok.  Your symptoms are most likely from a viral illness, possibly the flu.  We typically do not test for the flu if we have a high suspicion for it and treat it right away.   Use albuterol and atrovent inhalers every 4 hours when awake. Use mucinex during the day for cough production. Hycodan syrup helps suppress cough and is often helpful at night time. Stay well hydrated. Take tylenol for fevers.  Tamiflu helps prevent complications of the flu, however do not expect symptoms to resolve with tamiflu. A viral illness or influenza typically improve within 3-5 days.    Return to the emergency department for worsening cough, shortness of breath, chest pain, fevers, chills.

## 2017-02-12 NOTE — ED Triage Notes (Addendum)
Pt seen at [facility] for Cough and URI, given prescriptions and he took them without any relief. Presents with shortness of breath and non productive cough. Denies chest pain. Hx CHF wheezing bilaterally.

## 2017-02-13 ENCOUNTER — Telehealth: Payer: Self-pay | Admitting: *Deleted

## 2017-02-13 NOTE — Telephone Encounter (Signed)
Pharmacy called related to Rx: Atrovent HFA being too expensive for patient .Marland KitchenMarland KitchenEDCM clarified with EDP ([place]) to change Rx to: Albuterol MDI; 2 puffs Q 4 hours PRN cough/SOB.

## 2017-02-18 ENCOUNTER — Ambulatory Visit (INDEPENDENT_AMBULATORY_CARE_PROVIDER_SITE_OTHER): Payer: Federal, State, Local not specified - PPO | Admitting: Family Medicine

## 2017-02-18 ENCOUNTER — Encounter: Payer: Self-pay | Admitting: Family Medicine

## 2017-02-18 VITALS — BP 134/76 | HR 80 | Temp 97.4°F | Resp 14 | Ht 70.0 in | Wt 240.4 lb

## 2017-02-18 DIAGNOSIS — I5032 Chronic diastolic (congestive) heart failure: Secondary | ICD-10-CM | POA: Diagnosis not present

## 2017-02-18 DIAGNOSIS — Z131 Encounter for screening for diabetes mellitus: Secondary | ICD-10-CM | POA: Diagnosis not present

## 2017-02-18 DIAGNOSIS — E118 Type 2 diabetes mellitus with unspecified complications: Secondary | ICD-10-CM

## 2017-02-18 DIAGNOSIS — N184 Chronic kidney disease, stage 4 (severe): Secondary | ICD-10-CM

## 2017-02-18 LAB — POCT GLYCOSYLATED HEMOGLOBIN (HGB A1C): Hemoglobin A1C: 9.6

## 2017-02-18 LAB — POCT URINALYSIS DIP (DEVICE)
Bilirubin Urine: NEGATIVE
Glucose, UA: NEGATIVE mg/dL
Hgb urine dipstick: NEGATIVE
Ketones, ur: NEGATIVE mg/dL
Leukocytes, UA: NEGATIVE
NITRITE: NEGATIVE
PH: 6 (ref 5.0–8.0)
Protein, ur: 30 mg/dL — AB
SPECIFIC GRAVITY, URINE: 1.01 (ref 1.005–1.030)
UROBILINOGEN UA: 0.2 mg/dL (ref 0.0–1.0)

## 2017-02-18 MED ORDER — BLOOD GLUCOSE METER KIT: PACK | 0 refills | Status: DC

## 2017-02-18 NOTE — Patient Instructions (Signed)
Please follow-up immediately with the [facility] regarding the following severely abnormal labs that will require immediate attention and management:   Your A1c today is 9.6 which is grossly abnormal.  You are currently prescribed Ozemptic and metformin which can both adversely affect her already impaired kidney disease.  Your most recent creatinine is 2.9 with a gross kidney function of 28.  All of these values need to be followed up on and your diabetes needs aggressive management.   I recommend discontinuation of Ozemptic and Metformin. Starting on Lantus 20 units daily at night and continuation of glipizide.  These are only recommendations however your PCP at the [facility] may choose other alternatives to manage her diabetes. Labs were deferred today as you ill follow-up with the [facility] on tomorrow.     I am placing an order for you to obtain a glucometer.  I recommend checking your blood sugar first thing in the morning fasting and at least once prior to bedtime and keep in a log of your readings to take with you to any future medical appointments here in office or at the [facility].    Hyperglycemia Hyperglycemia is when the sugar (glucose) level in your blood is too high. It may not cause symptoms. If you do have symptoms, they may include warning signs, such as:  Feeling more thirsty than normal.  Hunger.  Feeling tired.  Needing to pee (urinate) more than normal.  Blurry eyesight (vision).  You may get other symptoms as it gets worse, such as:  Dry mouth.  Not being hungry (loss of appetite).  Fruity-smelling breath.  Weakness.  Weight gain or loss that is not planned. Weight loss may be fast.  A tingling or numb feeling in your hands or feet.  Headache.  Skin that does not bounce back quickly when it is lightly pinched and released (poor skin turgor).  Pain in your belly (abdomen).  Cuts or bruises that heal slowly.  High blood sugar can happen  to people who do or do not have diabetes. High blood sugar can happen slowly or quickly, and it can be an emergency. Follow these instructions at home: General instructions  Take over-the-counter and prescription medicines only as told by your doctor.  Do not use products that contain nicotine or tobacco, such as cigarettes and e-cigarettes. If you need help quitting, ask your doctor.  Limit alcohol intake to no more than 1 drink per day for nonpregnant women and 2 drinks per day for men. One drink equals 12 oz of beer, 5 oz of wine, or 1 oz of hard liquor.  Manage stress. If you need help with this, ask your doctor.  Keep all follow-up visits as told by your doctor. This is important. Eating and drinking  Stay at a healthy weight.  Exercise regularly, as told by your doctor.  Drink enough fluid, especially when you: ? Exercise. ? Get sick. ? Are in hot temperatures.  Eat healthy foods, such as: ? Low-fat (lean) proteins. ? Complex carbs (complex carbohydrates), such as whole wheat bread or brown rice. ? Fresh fruits and vegetables. ? Low-fat dairy products. ? Healthy fats.  Drink enough fluid to keep your pee (urine) clear or pale yellow. If you have diabetes:  Make sure you know the symptoms of hyperglycemia.  Follow your diabetes management plan, as told by your doctor. Make sure you: ? Take insulin and medicines as told. ? Follow your exercise plan. ? Follow your meal plan. Eat on  time. Do not skip meals. ? Check your blood sugar as often as told. Make sure to check before and after exercise. If you exercise longer or in a different way than you normally do, check your blood sugar more often. ? Follow your sick day plan whenever you cannot eat or drink normally. Make this plan ahead of time with your doctor.  Share your diabetes management plan with people in your workplace, school, and household.  Check your urine for ketones when you are ill and as told by your  doctor.  Carry a card or wear jewelry that says that you have diabetes. Contact a doctor if:  Your blood sugar level is higher than 240 mg/dL (13.3 mmol/L) for 2 days in a row.  You have problems keeping your blood sugar in your target range.  High blood sugar happens often for you. Get help right away if:  You have trouble breathing.  You have a change in how you think, feel, or act (mental status).  You feel sick to your stomach (nauseous), and that feeling does not go away.  You cannot stop throwing up (vomiting). These symptoms may be an emergency. Do not wait to see if the symptoms will go away. Get medical help right away. Call your local emergency services (911 in the [place] Do not drive yourself to the hospital. Summary  Hyperglycemia is when the sugar (glucose) level in your blood is too high.  High blood sugar can happen to people who do or do not have diabetes.  Make sure you drink enough fluids, eat healthy foods, and exercise regularly.  Contact your doctor if you have problems keeping your blood sugar in your target range. This information is not intended to replace advice given to you by your health care provider. Make sure you discuss any questions you have with your health care provider. Document Released: 10/29/2008 Document Revised: 09/19/2015 Document Reviewed: 09/19/2015 Elsevier Interactive Patient Education  2017 [employer].

## 2017-02-18 NOTE — Progress Notes (Signed)
Patient ID: Edward [name], male    DOB: 07/17/1940, 77 y.o.   MRN: [id number]  PCP: Scot Jun, FNP  Chief Complaint  Patient presents with  . Establish Care  . Hospitalization Follow-up    Subjective:  HPI Edward [name] is a 77 y.o. male with an extensive cardiovascular history including hypertension, CKD 4, coronary artery disease, chronic diastolic heart failure, sick sinus syndrome, peripheral vascular disease, atrial fib, atherosclerosis, history of diabetes which is untreated, presents to establish care today.  I have previously seen patient a few years ago at a prior practice, however he is establishing new with me today.  He was recently seen at the emergency department on 02/12/2017 with a complaint of shortness of breath and symptoms consistent with flu, labs were remarkable for leukocytosis, severe  renal impairment creatinine of 2.19, GFR of 28, and glucose was 219.  He was treated with Tamiflu, provided duo nebs to treat wheezing and shortness of breath. Discharged and advised to follow-up with PCP.  Loris was directed to come here for follow-up however he receives all medical care through [place]. He currently is followed at [facility] for primary care and nephrology services.  He has a nephrology appointment on Monday.  His A1c today on exam is 9.6.  In review of prior A1c's he is ranged in the low 7's and upper 6 quite some time.  He reports approximately 3 months ago being started on [facility]EMPIC by [facility] and has not had a [facility] follow-up since that time. He reports compliance with medications. He continues to take metformin and glipizide. He doesn't monitor his blood sugar at home therefore is uncertain of baseline glucose readings.  He does not wish to make any changes to his medications or provide labs today as he has a follow-up scheduled at the [place] with his nephrologist within the next 4 days.  He reports that he can go to the walk in clinic, to obtain an appointment  with his primary care provider and he reports that medications would have to be paid for out of pocket if he does not receive them directly from the [place].  At present he denies worsening shortness of breath (chronic dyspnea), chest pain, new weakness, dizziness. Social History   Socioeconomic History  . Marital status: Married    Spouse name: Not on file  . Number of children: Not on file  . Years of education: Not on file  . Highest education level: Not on file  Social Needs  . Financial resource strain: Not on file  . Food insecurity - worry: Not on file  . Food insecurity - inability: Not on file  . Transportation needs - medical: Not on file  . Transportation needs - non-medical: Not on file  Occupational History  . Not on file  Tobacco Use  . Smoking status: Former Smoker    Packs/day: 3.00    Years: 32.00    Pack years: 96.00    Types: Cigarettes    Last attempt to quit: 01/16/1979    Years since quitting: 38.1  . Smokeless tobacco: Never Used  Substance and Sexual Activity  . Alcohol use: Yes    Alcohol/week: 1.2 oz    Types: 2 Glasses of wine per week    Comment: beer 1-2 weekly (occasional)  . Drug use: No  . Sexual activity: Not Currently  Other Topics Concern  . Not on file  Social History Narrative  . Not on file  Family History  Problem Relation Age of Onset  . Coronary artery disease Father   . Hypertension Father   . Heart disease Father        Heart Disease before age 76  . Heart attack Mother   . Coronary artery disease Mother   . Deep vein thrombosis Mother   . Heart disease Mother        Heart Disease before age 43  . Hyperlipidemia Mother   . Hypertension Mother   . Hypertension Sister   . Heart disease Sister        Heart Disease before age 32  . Hyperlipidemia Sister   . Heart disease Brother        Heart Disease before age 70  . Anesthesia problems Neg Hx   . Diabetes Neg Hx   . Colon cancer Neg Hx      Review of Systems  Patient  Active Problem List   Diagnosis Date Noted  . Cough 02/15/2016  . Atelectasis/ subsegmental LLL ant basal segment  03/12/2015  . Morbid obesity ([place]) 03/12/2015  . Upper airway cough syndrome 03/11/2015  . PVD (peripheral vascular disease) ([place]) 08/26/2013  . Benign neoplasm of colon 02/17/2013  . 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 Anemia, iron deficiency 11/22/2012  . Dyspnea 11/21/2012  . Pacemaker lead malfunction 09/23/2012  . Aftercare following surgery of the circulatory system, [place] 09/10/2012  . Chronic diastolic CHF (congestive heart failure) ([place]) 04/23/2012  . Bilateral lower extremity edema 04/14/2012  . PVD (peripheral vascular disease) with claudication ([place]) 03/26/2012  . Peripheral vascular disease, unspecified ([place]) 02/13/2012  . Pacemaker-Medtronic 06/21/2011  . Accelerated junctional rhythm 06/21/2011  . Coronary artery disease 06/21/2011  . S/P Maze operation for atrial fibrillation 06/21/2011  . Sick sinus syndrome ([place]) 06/19/2011  . HTN (hypertension) 05/31/2011  . LBBB (left bundle branch block) 11/15/2010  . Hyperlipidemia 05/30/2010  . Atherosclerosis of renal artery ([place]) 05/19/2010  . PAF (paroxysmal atrial fibrillation) ([place]) 04/18/2010  . Dyspnea on exertion 04/18/2010  . Diabetes mellitus ([place]) 04/18/2010  . Atherosclerosis of native arteries of the extremities with intermittent claudication 04/18/2010  . Anemia 04/18/2010  . PAIN IN SOFT TISSUES OF LIMB 03/31/2010  . CAROTID ARTERY DISEASE 03/30/2010    Allergies  Allergen Reactions  . Lipitor [Atorvastatin Calcium] Other (See Comments)    Reaction unknown    Prior to Admission medications   Medication Sig Start Date End Date Taking? Authorizing Provider  amLODipine (NORVASC) 5 MG tablet TAKE 1 TABLET BY MOUTH EVERY DAY 09/19/16  Yes Dunn, Dayna N, PA-C  apixaban (ELIQUIS) 2.5 MG TABS tablet Take 2.5 mg by mouth 2 (two) times daily.   Yes [provider]  carvedilol (COREG) 25 MG tablet Take 1  tablet (25 mg total) by mouth 2 (two) times daily with a meal. 02/16/16  Yes Nahser, Wonda Cheng, MD  furosemide (LASIX) 40 MG tablet Take 2 tablets (80 mg) in the morning and 1 tablet (40 mg) in the afternoon Patient taking differently: Take 40-80 mg by mouth See admin instructions. Take 2 tablets (80 mg) in the morning and 1 tablet (40 mg) in the afternoon 07/02/16  Yes Nahser, Wonda Cheng, MD  gabapentin (NEURONTIN) 300 MG capsule Take 300-900 mg by mouth See admin instructions. Take 300 mg in the morning and 900 mg in the evening   Yes [provider]  glipiZIDE (GLUCOTROL) 5 MG tablet Take 5-10 mg by mouth See admin instructions. Take 1 tablet every  morning and take 2 tablets every evening   Yes [provider]  HYDROcodone-homatropine (HYCODAN) 5-1.5 MG/5ML syrup Take 5 mLs by mouth every 6 (six) hours as needed for cough. FOR DISRUPTIVE NIGHT TIME COUGH AND BODY ACHES 02/12/17  Yes Carmon Sails J, PA-C  ipratropium (ATROVENT HFA) 17 MCG/ACT inhaler Inhale 2 puffs into the lungs every 4 (four) hours as needed for wheezing. 02/12/17  Yes Kinnie Feil, PA-C  Iron Combinations (IRON COMPLEX PO) Take 2 capsules by mouth daily.   Yes [provider]  KLOR-CON M20 20 MEQ tablet TAKE 1 TABLET BY MOUTH EVERY DAY 10/22/16  Yes Nahser, Wonda Cheng, MD  loratadine (CLARITIN) 10 MG tablet Take 10 mg by mouth daily.   Yes [provider]  losartan (COZAAR) 100 MG tablet TAKE 1 TABLET BY MOUTH EVERY DAY 10/22/16  Yes Nahser, Wonda Cheng, MD  Magnesium Oxide 400 MG CAPS Take 800 mg by mouth every evening.   Yes [provider]  metFORMIN (GLUCOPHAGE) 500 MG tablet Take 500 mg by mouth 2 (two) times daily with a meal.   Yes [provider]  Multiple Vitamin (MULTIVITAMIN WITH MINERALS) TABS Take 1 tablet by mouth daily.   Yes [provider]  omeprazole (PRILOSEC) 20 MG capsule Take 20 mg by mouth 2 (two) times daily before a meal.   Yes [provider]  pravastatin (PRAVACHOL) 80 MG tablet TAKE 1 TABLET (80 MG TOTAL) BY MOUTH DAILY. 10/22/16  Yes Nahser, Wonda Cheng, MD  sildenafil (REVATIO) 20 MG tablet Take 5 tablets (100 mg total) by mouth as needed (for sexual activity). 01/03/17  Yes Nahser, Wonda Cheng, MD  famotidine (PEPCID) 20 MG tablet Take 20 mg by mouth daily.    [provider]  oseltamivir (TAMIFLU) 75 MG capsule Take 1 capsule (75 mg total) by mouth every 12 (twelve) hours. Patient not taking: Reported on 02/18/2017 02/12/17   Arlean Hopping    Past Medical, Surgical Family and Social History reviewed and updated.    Objective:   Today's Vitals   02/18/17 0913  BP: 134/76  Pulse: 80  Resp: 14  Temp: (!) 97.4 F (36.3 C)  TempSrc: Oral  SpO2: 97%  Weight: 240 lb 6.4 oz (109 kg)  Height: 5' 10"  (1.778 m)    Wt Readings from Last 3 Encounters:  02/18/17 240 lb 6.4 oz (109 kg)  02/12/17 236 lb (107 kg)  01/03/17 236 lb (107 kg)    Physical Exam  Constitutional: He is oriented to person, place, and time.  Grossly obese and appears in poor general health  HENT:  Head: Normocephalic.  Eyes: Conjunctivae and EOM are normal. Pupils are equal, round, and reactive to light.  Neck: Normal range of motion. Neck supple.  Cardiovascular: Normal rate, regular rhythm, normal heart sounds and intact distal pulses.  Pulmonary/Chest: Effort normal. He has decreased breath sounds.  Abdominal: Bowel sounds are normal. He exhibits distension.  Musculoskeletal: Normal range of motion. He exhibits edema.  Bilateral +3 non-pitting edema  Lymphadenopathy:    He has no cervical adenopathy.  Neurological: He is alert and oriented to person, place, and time.  Skin: Skin is dry.  Psychiatric: He has a normal mood and affect. His behavior is normal. Judgment and thought content normal.   Assessment & Plan:  1. Type 2 diabetes mellitus with complication, without long-term current use of insulin ([place]), current A1C 9.6,  uncontrolled worsening. Offered to manage patient here in office  however, he is established at the [facility]. Advised patient to follow-up immediately with PCP as current diabetes regimen is not working.  I am also concerned that patient continues on metformin in his GFR is recently dropped below 30, he is on chronic diuretic therapy for heart failure, and currently on an ACE inhibitor. According to contraindications for administration of Ozempic, medication has potential to be nephrotoxic. Recent GFR 28, creatinine 2.19. Recommended immediate evaluation with PCP and Nephrology for medication management.  2. Stage 4 chronic kidney disease ([place]), recent creatinine 2.19 with GFR 28, keep scheduled follow-up with nephrology at [facility].  3. Chronic diastolic CHF (congestive heart failure) ([place]), patient is established and managed by with Dr. Mertie Moores cardiologist, last office visit was January 03, 2017.  Keep upcoming follow-up appointments.   Patient is followed at [facility] for primary care and nephrology specialty. He only presented here today due  to referral from ED and my name listed on PCP. All medications and labs are completed through [facility] as he is not charged when care is provided by the [place] system. He was advised according to his most recent labs, he requires immediate follow-up with his PCP. I advised him to follow-up at the [place] today or no later than in the morning.   -Patient is already established with a PCP at the [place].  No follow-up on file.  Follow-up as needed.  Carroll Sage. Kenton Kingfisher, MSN, FNP-C [facility] [place]  [address] [place],  [address] [phone]

## 2017-03-06 ENCOUNTER — Encounter (HOSPITAL_COMMUNITY): Payer: Federal, State, Local not specified - PPO

## 2017-03-06 ENCOUNTER — Ambulatory Visit: Payer: Federal, State, Local not specified - PPO | Admitting: Family

## 2017-03-06 ENCOUNTER — Other Ambulatory Visit (HOSPITAL_COMMUNITY): Payer: Federal, State, Local not specified - PPO

## 2017-03-12 ENCOUNTER — Ambulatory Visit (INDEPENDENT_AMBULATORY_CARE_PROVIDER_SITE_OTHER)
Admission: RE | Admit: 2017-03-12 | Discharge: 2017-03-12 | Disposition: A | Discharge status: Home / Self Care | Payer: Federal, State, Local not specified - PPO | Source: Ambulatory Visit | Attending: Family | Admitting: Family

## 2017-03-12 ENCOUNTER — Ambulatory Visit (HOSPITAL_COMMUNITY)
Admission: RE | Admit: 2017-03-12 | Discharge: 2017-03-12 | Disposition: A | Discharge status: Home / Self Care | Payer: Federal, State, Local not specified - PPO | Source: Ambulatory Visit | Attending: Family | Admitting: Family

## 2017-03-12 ENCOUNTER — Other Ambulatory Visit: Payer: Self-pay

## 2017-03-12 ENCOUNTER — Encounter: Payer: Self-pay | Admitting: Family

## 2017-03-12 ENCOUNTER — Ambulatory Visit: Payer: Federal, State, Local not specified - PPO | Admitting: Family

## 2017-03-12 VITALS — BP 129/81 | HR 90 | Temp 97.0°F | Resp 20 | Ht 70.0 in | Wt 237.0 lb

## 2017-03-12 DIAGNOSIS — Z87891 Personal history of nicotine dependence: Secondary | ICD-10-CM

## 2017-03-12 DIAGNOSIS — Z9889 Other specified postprocedural states: Secondary | ICD-10-CM | POA: Insufficient documentation

## 2017-03-12 DIAGNOSIS — I6523 Occlusion and stenosis of bilateral carotid arteries: Secondary | ICD-10-CM

## 2017-03-12 DIAGNOSIS — Z95828 Presence of other vascular implants and grafts: Secondary | ICD-10-CM | POA: Diagnosis not present

## 2017-03-12 DIAGNOSIS — I779 Disorder of arteries and arterioles, unspecified: Secondary | ICD-10-CM

## 2017-03-12 DIAGNOSIS — E1151 Type 2 diabetes mellitus with diabetic peripheral angiopathy without gangrene: Secondary | ICD-10-CM | POA: Diagnosis not present

## 2017-03-12 NOTE — Progress Notes (Signed)
[facility] [place] HISTORY AND PHYSICAL   CC: Follow up extracranial carotid artery stenosis and peripheral artery occlusive disease   History of Present Illness:   Edward [name] is a 77 y.o. male who is s/p left femoropopliteal bypass graft with vein on 03/06/2012 by Dr. Scot Dock.   He is also s/p right CEA on 03/22/09.   He denies claudication symptoms, denies non-healing wounds.  He had slurred speech for about a day, in 2012, no further TIA or stroke symptoms.   He states his recent stress test was normal.  Pt Diabetic: Yes, his A1C on 02-18-17 was 9.6 Pt smoker: former smoker, quit in 1981  Pt meds include:  Statin :Yes Betablocker: Yes ASA: No, stopped when Eliquis started in October 2015 Other anticoagulants/antiplatelets: Eliquis for atrial fibrillation, (review of records) started after his October 2015 visit with his cardiologist, Dr. Caryl Comes. 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    Current Outpatient Medications  Medication Sig Dispense Refill  . amLODipine (NORVASC) 5 MG tablet TAKE 1 TABLET BY MOUTH EVERY DAY 90 tablet 1  . apixaban (ELIQUIS) 2.5 MG TABS tablet Take 2.5 mg by mouth 2 (two) times daily.    . blood glucose meter kit and supplies Dispense based on patient and insurance preference. Use up to four times daily as directed. (FOR ICD-10 E10.9, E11.9). 1 each 0  . carvedilol (COREG) 25 MG tablet Take 1 tablet (25 mg total) by mouth 2 (two) times daily with a meal. 180 tablet 0  . famotidine (PEPCID) 20 MG tablet Take 20 mg by mouth daily.    . furosemide (LASIX) 40 MG tablet Take 2 tablets (80 mg) in the morning and 1 tablet (40 mg) in the afternoon (Patient taking differently: Take 40-80 mg by mouth See admin instructions. Take 2 tablets (80 mg) in the morning and 1 tablet (40 mg) in the afternoon) 270 tablet 3  . gabapentin (NEURONTIN) 300 MG capsule Take 100 mg by mouth See admin instructions. Take 300  mg in the morning and 900 mg in the evening    . glipiZIDE (GLUCOTROL) 5 MG tablet Take 5-10 mg by mouth See admin instructions. Take 1 tablet every morning and take 2 tablets every evening    . HYDROcodone-homatropine (HYCODAN) 5-1.5 MG/5ML syrup Take 5 mLs by mouth every 6 (six) hours as needed for cough. FOR DISRUPTIVE NIGHT TIME COUGH AND BODY ACHES 120 mL 0  . ipratropium (ATROVENT HFA) 17 MCG/ACT inhaler Inhale 2 puffs into the lungs every 4 (four) hours as needed for wheezing. 1 Inhaler 12  . Iron Combinations (IRON COMPLEX PO) Take 2 capsules by mouth daily.    Marland Kitchen KLOR-CON M20 20 MEQ tablet TAKE 1 TABLET BY MOUTH EVERY DAY 90 tablet 3  . loratadine (CLARITIN) 10 MG tablet Take 10 mg by mouth daily.    Marland Kitchen losartan (COZAAR) 100 MG tablet TAKE 1 TABLET BY MOUTH EVERY DAY 90 tablet 3  . Magnesium Oxide 400 MG CAPS Take 800 mg by mouth every evening.    . Multiple Vitamin (MULTIVITAMIN WITH MINERALS) TABS Take 1 tablet by mouth daily.    Marland Kitchen omeprazole (PRILOSEC) 20 MG capsule Take 20 mg by mouth 2 (two) times daily before a meal.    . oseltamivir (TAMIFLU) 75 MG capsule Take 1 capsule (75 mg total) by mouth every 12 (twelve) hours. 10 capsule 0  . pravastatin (PRAVACHOL) 80 MG tablet TAKE 1 TABLET (80 MG  TOTAL) BY MOUTH DAILY. 90 tablet 3  . sildenafil (REVATIO) 20 MG tablet Take 5 tablets (100 mg total) by mouth as needed (for sexual activity). 50 tablet 3   No current facility-administered medications for this visit.     Past Medical History:  Diagnosis Date  . Anemia    ?  . Carotid artery occlusion   . Chronic diastolic CHF (congestive heart failure) ([place]) 04/23/2012  . Claudication ([place])   . Coronary artery disease    s/p CABG 2013  . Diabetic neuropathy (HCC)    mostly feet/ legs  . Dyslipidemia   . Exertional shortness of breath   . GERD (gastroesophageal reflux disease)   . GI bleed    while on triple anticoag therapy  . HTN (hypertension)   . Hyperlipidemia   .  Neuromuscular disorder (HCC)    neuropathy  . Pacemaker   . PAF (paroxysmal atrial fibrillation) (HCC)    s/p MAZE at time of CABG  . PVD (peripheral vascular disease) ([place])    R CEA 2011, prior PTA, left fem-pop 2014  . Stroke ([place])    vs TIA  . Type II diabetes mellitus ([place])     Social History Social History   Tobacco Use  . Smoking status: Former Smoker    Packs/day: 3.00    Years: 32.00    Pack years: 96.00    Types: Cigarettes    Last attempt to quit: 01/16/1979    Years since quitting: 38.1  . Smokeless tobacco: Never Used  Substance Use Topics  . Alcohol use: Yes    Alcohol/week: 1.2 oz    Types: 2 Glasses of wine per week    Comment: beer 1-2 weekly (occasional)  . Drug use: No    Family History Family History  Problem Relation Age of Onset  . Coronary artery disease Father   . Hypertension Father   . Heart disease Father        Heart Disease before age 58  . Heart attack Mother   . Coronary artery disease Mother   . Deep vein thrombosis Mother   . Heart disease Mother        Heart Disease before age 56  . Hyperlipidemia Mother   . Hypertension Mother   . Hypertension Sister   . Heart disease Sister        Heart Disease before age 6  . Hyperlipidemia Sister   . Heart disease Brother        Heart Disease before age 80  . Anesthesia problems Neg Hx   . Diabetes Neg Hx   . Colon cancer Neg Hx     Surgical History Past Surgical History:  Procedure Laterality Date  . ABDOMINAL AORTAGRAM N/A 01/07/2012   Procedure: ABDOMINAL Maxcine Ham;  Surgeon: Angelia Mould, MD;  Location: [facility] CATH LAB;  Service: Cardiovascular;  Laterality: N/A;  . BALLOON DILATION N/A 02/17/2013   Procedure: BALLOON DILATION;  Surgeon: Jerene Bears, MD;  Location: [facility] ENDOSCOPY;  Service: Gastroenterology;  Laterality: N/A;  . CAROTID ENDARTERECTOMY Right 03/2009  . CHOLECYSTECTOMY    . CIRCUMCISION    . COLONOSCOPY WITH PROPOFOL N/A 02/17/2013   Procedure: COLONOSCOPY WITH  PROPOFOL;  Surgeon: Jerene Bears, MD;  Location: [facility] ENDOS[facility];  Service: Gastroenterology;  Laterality: N/A;  . CORONARY ARTERY BYPASS GRAFT  06/14/2011   Procedure: CORONARY ARTERY BYPASS GRAFTING (CABG);  Surgeon: Gaye Pollack, MD;  Location: [place];  Service: Open Heart Surgery;  Laterality: N/A;  Coronary Artery Bypass Graft on pump times six;  utilizing internal mammary artery and right greater saphenous vein harvested endoscopically.x 5 vessels  . ELECTROPHYSIOLOGY STUDY N/A 11/05/2012   Procedure: ELECTROPHYSIOLOGY STUDY;  Surgeon: Deboraha Sprang, MD;  Location: [facility] CATH LAB;  Service: Cardiovascular;  Laterality: N/A;  . ESOPHAGOGASTRODUODENOSCOPY (EGD) WITH PROPOFOL N/A 02/17/2013   Procedure: ESOPHAGOGASTRODUODENOSCOPY (EGD) WITH PROPOFOL;  Surgeon: Jerene Bears, MD;  Location: [facility] ENDOS[facility];  Service: Gastroenterology;  Laterality: N/A;  . FEMORAL-POPLITEAL BYPASS GRAFT Left 03/06/2012   Procedure: BYPASS GRAFT FEMORAL-POPLITEAL ARTERY;  Surgeon: Angelia Mould, MD;  Location: [place];  Service: Vascular;  Laterality: Left;  Left Femoral - Below Knee Popliteal Bypass Graft with Vein and Intraoperative Arteriogram.  . INSERT / REPLACE / REMOVE PACEMAKER  2013   /notes 11/05/2012-[employer]  . JOINT REPLACEMENT Left    left TKA  . KNEE ARTHROSCOPY Left    "I had 3" (11/05/2012)  . LEAD REVISION  11/05/2012   pacemaker/notes 11/05/2012  . LEAD REVISION N/A 11/05/2012   Procedure: LEAD REVISION;  Surgeon: Deboraha Sprang, MD;  Location: [facility] CATH LAB;  Service: Cardiovascular;  Laterality: N/A;  . MAZE  06/14/2011   Procedure: MAZE;  Surgeon: Gaye Pollack, MD;  Location: [place];  Service: Open Heart Surgery;  Laterality: N/A;  [place] left atrial appendage  . PERMANENT PACEMAKER INSERTION N/A 06/20/2011   Procedure: PERMANENT PACEMAKER INSERTION;  Surgeon: Deboraha Sprang, MD;  Location: [facility] CATH LAB;  Service: Cardiovascular;  Laterality: N/A;  . TONSILLECTOMY AND ADENOIDECTOMY    . TOTAL KNEE  ARTHROPLASTY Left     Allergies  Allergen Reactions  . Lipitor [Atorvastatin Calcium] Other (See Comments)    Reaction unknown    Current Outpatient Medications  Medication Sig Dispense Refill  . amLODipine (NORVASC) 5 MG tablet TAKE 1 TABLET BY MOUTH EVERY DAY 90 tablet 1  . apixaban (ELIQUIS) 2.5 MG TABS tablet Take 2.5 mg by mouth 2 (two) times daily.    . blood glucose meter kit and supplies Dispense based on patient and insurance preference. Use up to four times daily as directed. (FOR ICD-10 E10.9, E11.9). 1 each 0  . carvedilol (COREG) 25 MG tablet Take 1 tablet (25 mg total) by mouth 2 (two) times daily with a meal. 180 tablet 0  . famotidine (PEPCID) 20 MG tablet Take 20 mg by mouth daily.    . furosemide (LASIX) 40 MG tablet Take 2 tablets (80 mg) in the morning and 1 tablet (40 mg) in the afternoon (Patient taking differently: Take 40-80 mg by mouth See admin instructions. Take 2 tablets (80 mg) in the morning and 1 tablet (40 mg) in the afternoon) 270 tablet 3  . gabapentin (NEURONTIN) 300 MG capsule Take 100 mg by mouth See admin instructions. Take 300 mg in the morning and 900 mg in the evening    . glipiZIDE (GLUCOTROL) 5 MG tablet Take 5-10 mg by mouth See admin instructions. Take 1 tablet every morning and take 2 tablets every evening    . HYDROcodone-homatropine (HYCODAN) 5-1.5 MG/5ML syrup Take 5 mLs by mouth every 6 (six) hours as needed for cough. FOR DISRUPTIVE NIGHT TIME COUGH AND BODY ACHES 120 mL 0  . ipratropium (ATROVENT HFA) 17 MCG/ACT inhaler Inhale 2 puffs into the lungs every 4 (four) hours as needed for wheezing. 1 Inhaler 12  . Iron Combinations (IRON COMPLEX PO) Take 2 capsules by mouth daily.    Marland Kitchen KLOR-CON M20 20 MEQ  tablet TAKE 1 TABLET BY MOUTH EVERY DAY 90 tablet 3  . loratadine (CLARITIN) 10 MG tablet Take 10 mg by mouth daily.    Marland Kitchen losartan (COZAAR) 100 MG tablet TAKE 1 TABLET BY MOUTH EVERY DAY 90 tablet 3  . Magnesium Oxide 400 MG CAPS Take 800 mg by  mouth every evening.    . Multiple Vitamin (MULTIVITAMIN WITH MINERALS) TABS Take 1 tablet by mouth daily.    Marland Kitchen omeprazole (PRILOSEC) 20 MG capsule Take 20 mg by mouth 2 (two) times daily before a meal.    . oseltamivir (TAMIFLU) 75 MG capsule Take 1 capsule (75 mg total) by mouth every 12 (twelve) hours. 10 capsule 0  . pravastatin (PRAVACHOL) 80 MG tablet TAKE 1 TABLET (80 MG TOTAL) BY MOUTH DAILY. 90 tablet 3  . sildenafil (REVATIO) 20 MG tablet Take 5 tablets (100 mg total) by mouth as needed (for sexual activity). 50 tablet 3   No current facility-administered medications for this visit.      REVIEW OF SYSTEMS: See HPI for pertinent positives and negatives.  Physical Examination Vitals:   03/12/17 1409 03/12/17 1412  BP: 134/81 129/81  Pulse: 90   Resp: 20   Temp: (!) 97 F (36.1 C)   TempSrc: Oral   SpO2: 97%   Weight: 237 lb (107.5 kg)   Height: 5' 10" (1.778 m)    Body mass index is 34.01 kg/m.  General:  A&O x 3, WDWN, obese male.  Gait: slow, steady, using a cane Eyes: PERRLA  Pulmonary: CTAB, non labored respirations Cardiac: regular rhythm, no detected murmur   Carotid Bruits Left Right   Negative  Negative    Abdominal aortic pulse is not palpable  Radial pulses: are 2+ and =   VASCULAR EXAM: Extremitieswithout ischemic changes, without Gangrene; without open wounds.  Both feet are warm and well-perfused. He has 1+ pitting edema in both lower legs, no erythema.   LE Pulses  LEFT  RIGHT   FEMORAL  not palpable(obese) not palpable (obese)  POPLITEAL  not palpable  not palpable  PT 2+ palpable 2+ palpable  DP 2+ palpable 2+palpable   Abdomen: soft, NT, no palpable masses, large panus.  Skin: no rashes, no ulcers.  Musculoskeletal: no muscle wasting or atrophy.  Neurologic:  A&O X 3; appropriate affect, sensation is normal; speech is normal, CN 2-12 intact, pain and light  touch intact in extremities, motor exam as listed above. Psychiatric: Normal thought content, mood appropriate to clinical situation.     ASSESSMENT:  Edward [name] is a 77 y.o. male who is s/p left femoropopliteal bypass graft with vein on 03/06/2012.  He is also s/p right CEA on 03/22/09.   He had a TIA in 2012, none subsequently, no hx of stroke. He has no claudication sx's with walking, no signs of ischemia in his feet/legs.  DATA   Carotid Duplex (03/12/17): WIdely patent right carotid endarterectomy without evidence of restenosis or hyperplasia.  1-39% left internal carotid artery stenosis. Right vertebral artery flow is antegrade/restrictive  Left vertebral artery flow is antegrade.  Bilateral subclavian artery waveforms are normal.  Right vertebral artery demonstrates antegrade flow/restrictive; previous exam indicates functionally occluded. No other significant changes compared to prior exams on 03-03-15 and 03-06-16.  Left LE Arterial Duplex (03/12/17): Patent left femoropopliteal arterial bypass graft without evidence of restenosis, bi and triphasic waveforms. No significant change compared to exams on 03-03-15 and 03-06-16.   ABI (Date: 03/12/2017):  R:  ABI: 1.19 (was 1.22 on 03-06-16),   PT: tri  DP: tri  TBI:  0.98 (was [place])  L:   ABI: 1.13 (was 1.24),   PT: tri  DP: tri  TBI: 1.02 (was )   PLAN:   I advised pt to walk at least 30 minutes daily.   Based on today's exam and non-invasive vascular lab results, the patient will follow up in 1 year with the following tests: ABI's,  left LE arterial duplex, and carotid duplex.   I discussed in depth with the patient the nature of atherosclerosis, and emphasized the importance of maximal medical management including strict control of blood pressure, blood glucose, and lipid levels, obtaining regular exercise, and cessation of smoking.  The patient is aware that without maximal medical management  the underlying atherosclerotic disease process will progress, limiting the benefit of any interventions.  The patient was given information about stroke prevention and what symptoms should prompt the patient to seek immediate medical care.  The patient was given information about PAD including signs, symptoms, treatment, what symptoms should prompt the patient to seek immediate medical care, and risk reduction measures to take.  Thank you for allowing [place] to participate in this patient's care.  Clemon Chambers, RN, MSN, FNP-C [facility] Office: [phone]  Clinic MD: Early 03/12/2017 2:41 PM

## 2017-03-12 NOTE — Patient Instructions (Signed)
Peripheral Vascular Disease Peripheral vascular disease (PVD) is a disease of the blood vessels that are not part of your heart and brain. A simple term for PVD is poor circulation. In most cases, PVD narrows the blood vessels that carry blood from your heart to the rest of your body. This can result in a decreased supply of blood to your arms, legs, and internal organs, like your stomach or kidneys. However, it most often affects a person's lower legs and feet. There are two types of PVD.  Organic PVD. This is the more common type. It is caused by damage to the structure of blood vessels.  Functional PVD. This is caused by conditions that make blood vessels contract and tighten (spasm).  Without treatment, PVD tends to get worse over time. PVD can also lead to acute ischemic limb. This is when an arm or limb suddenly has trouble getting enough blood. This is a medical emergency. Follow these instructions at home:  Take medicines only as told by your doctor.  Do not use any tobacco products, including cigarettes, chewing tobacco, or electronic cigarettes. If you need help quitting, ask your doctor.  Lose weight if you are overweight, and maintain a healthy weight as told by your doctor.  Eat a diet that is low in fat and cholesterol. If you need help, ask your doctor.  Exercise regularly. Ask your doctor for some good activities for you.  Take good care of your feet. ? Wear comfortable shoes that fit well. ? Check your feet often for any cuts or sores. Contact a doctor if:  You have cramps in your legs while walking.  You have leg pain when you are at rest.  You have coldness in a leg or foot.  Your skin changes.  You are unable to get or have an erection (erectile dysfunction).  You have cuts or sores on your feet that are not healing. Get help right away if:  Your arm or leg turns cold and blue.  Your arms or legs become red, warm, swollen, painful, or numb.  You have  chest pain or trouble breathing.  You suddenly have weakness in your face, arm, or leg.  You become very confused or you cannot speak.  You suddenly have a very bad headache.  You suddenly cannot see. This information is not intended to replace advice given to you by your health care provider. Make sure you discuss any questions you have with your health care provider. Document Released: 03/28/2009 Document Revised: 06/09/2015 Document Reviewed: 06/11/2013 Elsevier Interactive Patient Education  2017 [employer].       Stroke Prevention Some health problems and behaviors may make it more likely for you to have a stroke. Below are ways to lessen your risk of having a stroke.  Be active for at least 30 minutes on most or all days.  Do not smoke. Try not to be around others who smoke.  Do not drink too much alcohol. ? Do not have more than 2 drinks a day if you are a man. ? Do not have more than 1 drink a day if you are a woman and are not pregnant.  Eat healthy foods, such as fruits and vegetables. If you were put on a specific diet, follow the diet as told.  Keep your cholesterol levels under control through diet and medicines. Look for foods that are low in saturated fat, trans fat, cholesterol, and are high in fiber.  If you have diabetes, follow  all diet plans and take your medicine as told.  Ask your doctor if you need treatment to lower your blood pressure. If you have high blood pressure (hypertension), follow all diet plans and take your medicine as told by your doctor.  If you are 47-88 years old, have your blood pressure checked every 3-5 years. If you are age 18 or older, have your blood pressure checked every year.  Keep a healthy weight. Eat foods that are low in calories, salt, saturated fat, trans fat, and cholesterol.  Do not take drugs.  Avoid birth control pills, if this applies. Talk to your doctor about the risks of taking birth control pills.  Talk to  your doctor if you have sleep problems (sleep apnea).  Take all medicine as told by your doctor. ? You may be told to take aspirin or blood thinner medicine. Take this medicine as told by your doctor. ? Understand your medicine instructions.  Make sure any other conditions you have are being taken care of.  Get help right away if:  You suddenly lose feeling (you feel numb) or have weakness in your face, arm, or leg.  Your face or eyelid hangs down to one side.  You suddenly feel confused.  You have trouble talking (aphasia) or understanding what people are saying.  You suddenly have trouble seeing in one or both eyes.  You suddenly have trouble walking.  You are dizzy.  You lose your balance or your movements are clumsy (uncoordinated).  You suddenly have a very bad headache and you do not know the cause.  You have new chest pain.  Your heart feels like it is fluttering or skipping a beat (irregular heartbeat). Do not wait to see if the symptoms above go away. Get help right away. Call your local emergency services (911 in [place] Do not drive yourself to the hospital. This information is not intended to replace advice given to you by your health care provider. Make sure you discuss any questions you have with your health care provider. Document Released: 07/03/2011 Document Revised: 06/09/2015 Document Reviewed: 07/04/2012 Elsevier Interactive Patient Education  [employer].

## 2017-03-13 LAB — VAS US CAROTID
LCCADDIAS: -25 cm/s
LCCADSYS: -144 cm/s
LCCAPDIAS: 21 cm/s
LCCAPSYS: 109 cm/s
LEFT ECA DIAS: -7 cm/s
LEFT VERTEBRAL DIAS: -17 cm/s
LICADDIAS: -21 cm/s
LICADSYS: -70 cm/s
Left ICA prox dias: -28 cm/s
Left ICA prox sys: -119 cm/s
RCCADSYS: -84 cm/s
RCCAPSYS: 100 cm/s
RIGHT CCA MID DIAS: 18 cm/s
RIGHT ECA DIAS: -8 cm/s
RIGHT VERTEBRAL DIAS: -5 cm/s
Right CCA prox dias: 10 cm/s

## 2017-03-25 ENCOUNTER — Other Ambulatory Visit: Payer: Self-pay | Admitting: Physician Assistant

## 2017-03-25 DIAGNOSIS — I10 Essential (primary) hypertension: Secondary | ICD-10-CM

## 2017-03-26 ENCOUNTER — Encounter: Payer: Self-pay | Admitting: *Deleted

## 2017-04-05 ENCOUNTER — Ambulatory Visit: Payer: Federal, State, Local not specified - PPO | Admitting: Internal Medicine

## 2017-04-05 VITALS — BP 128/68 | HR 82 | Ht 70.0 in | Wt 244.4 lb

## 2017-04-05 DIAGNOSIS — I495 Sick sinus syndrome: Secondary | ICD-10-CM | POA: Diagnosis not present

## 2017-04-05 DIAGNOSIS — I480 Paroxysmal atrial fibrillation: Secondary | ICD-10-CM | POA: Diagnosis not present

## 2017-04-05 DIAGNOSIS — I5032 Chronic diastolic (congestive) heart failure: Secondary | ICD-10-CM

## 2017-04-05 DIAGNOSIS — Z950 Presence of cardiac pacemaker: Secondary | ICD-10-CM | POA: Diagnosis not present

## 2017-04-05 NOTE — Progress Notes (Signed)
Patient Care Team: Scot Jun, FNP as PCP - General (Family Medicine) Angelia Mould, MD as Attending Physician (Vascular Surgery) Nahser, Wonda Cheng, MD as Attending Physician (Cardiology) Gaye Pollack, MD as Attending Physician (Cardiothoracic Surgery) Deboraha Sprang, MD as Attending Physician (Cardiology) Rodney Langton, MD as Referring Physician (Internal Medicine)   HPI  Edward [name] is a 77 y.o. male Seen in followup for pacemaker implanted for sinus node dysfunction and continue rapid junctional rhythm. This emerged post bypass surgery with concomitant maze.    He has hx of CAD with prior CABG 2013; cath has been deferred because of renal insufficiency  We undertook pacing for atrial lead revision. It turned out that there is significantly compartmentalized atrial activity and we finally were able to find a site we can capture in case the atrium; there was no significant interval improvement.   Notes from last months reviewed   Ongoing issue with cough; has been seen by pulmonary 3/17 with a presumptive diagnosis of reflux. No interval follow-up.  myoview 2018 EF 60% with prior scar  Date Cr K Hgb   1/18 1.76 4.8 12.6  1/19 2.19 4.1 12.8   He takes Eliquis 2.5 twice daily.  He has been disinclined to take standard dosing because of problems with GI bleeding.  He is aware of the decreased efficacy.  He has had problems with his diabetes.  There are now issues terms of using insulin which she would like to avoid.  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      Past Medical History:  Diagnosis Date  . Anemia    ?  . Carotid artery occlusion   . Chronic diastolic CHF (congestive heart failure) ([place]) 04/23/2012  . CKD (chronic kidney disease), stage III ([place])   . Claudication ([place])   . Coronary artery  disease    s/p CABG 2013  . Diabetes ([place])   . Diabetic neuropathy (HCC)    mostly feet/ legs  . Diverticulosis   . Dyslipidemia   . Exertional shortness of breath   . GERD (gastroesophageal reflux disease)   . GI bleed    while on triple anticoag therapy  . HTN (hypertension)   . Hyperlipidemia   . Neuromuscular disorder (HCC)    neuropathy  . Pacemaker   . PAF (paroxysmal atrial fibrillation) (HCC)    s/p MAZE at time of CABG  . PVD (peripheral vascular disease) ([place])    R CEA 2011, prior PTA, left fem-pop 2014  . Stroke ([place])    vs TIA  . Tubular adenoma of colon   . Type II diabetes mellitus ([place])     Past Surgical History:  Procedure Laterality Date  . ABDOMINAL AORTAGRAM N/A 01/07/2012   Procedure: ABDOMINAL Maxcine Ham;  Surgeon: Angelia Mould, MD;  Location: [facility] CATH LAB;  Service: Cardiovascular;  Laterality: N/A;  . BALLOON DILATION N/A 02/17/2013   Procedure: BALLOON DILATION;  Surgeon: Jerene Bears, MD;  Location: [facility] ENDOSCOPY;  Service: Gastroenterology;  Laterality: N/A;  . CAROTID ENDARTERECTOMY Right 03/2009  . CHOLECYSTECTOMY    . CIRCUMCISION    . COLONOSCOPY WITH PROPOFOL N/A 02/17/2013   Procedure: COLONOSCOPY WITH PROPOFOL;  Surgeon: Jerene Bears, MD;  [facility];  Service: Gastroenterology;  Laterality: N/A;  . CORONARY ARTERY BYPASS  GRAFT  06/14/2011   Procedure: CORONARY ARTERY BYPASS GRAFTING (CABG);  Surgeon: Gaye Pollack, MD;  Location: [place];  Service: Open Heart Surgery;  Laterality: N/A;  Coronary Artery Bypass Graft on pump times six;  utilizing internal mammary artery and right greater saphenous vein harvested endoscopically.x 5 vessels  . ELECTROPHYSIOLOGY STUDY N/A 11/05/2012   Procedure: ELECTROPHYSIOLOGY STUDY;  Surgeon: Deboraha Sprang, MD;  Location: [facility] CATH LAB;  Service: Cardiovascular;  Laterality: N/A;  . ESOPHAGOGASTRODUODENOSCOPY (EGD) WITH PROPOFOL N/A 02/17/2013   Procedure: ESOPHAGOGASTRODUODENOSCOPY (EGD) WITH  PROPOFOL;  Surgeon: Jerene Bears, MD;  [facility];  Service: Gastroenterology;  Laterality: N/A;  . FEMORAL-POPLITEAL BYPASS GRAFT Left 03/06/2012   Procedure: BYPASS GRAFT FEMORAL-POPLITEAL ARTERY;  Surgeon: Angelia Mould, MD;  Location: [place];  Service: Vascular;  Laterality: Left;  Left Femoral - Below Knee Popliteal Bypass Graft with Vein and Intraoperative Arteriogram.  . INSERT / REPLACE / REMOVE PACEMAKER  2013   /notes 11/05/2012-[employer]tronic  . JOINT REPLACEMENT Left    left TKA  . KNEE ARTHROSCOPY Left    "I had 3" (11/05/2012)  . LEAD REVISION  11/05/2012   pacemaker/notes 11/05/2012  . LEAD REVISION N/A 11/05/2012   Procedure: LEAD REVISION;  Surgeon: Deboraha Sprang, MD;  Location: [facility] CATH LAB;  Service: Cardiovascular;  Laterality: N/A;  . MAZE  06/14/2011   Procedure: MAZE;  Surgeon: Gaye Pollack, MD;  Location: [place];  Service: Open Heart Surgery;  Laterality: N/A;  Ligate left atrial appendage  . PERMANENT PACEMAKER INSERTION N/A 06/20/2011   Procedure: PERMANENT PACEMAKER INSERTION;  Surgeon: Deboraha Sprang, MD;  Location: [facility] CATH LAB;  Service: Cardiovascular;  Laterality: N/A;  . TONSILLECTOMY AND ADENOIDECTOMY    . TOTAL KNEE ARTHROPLASTY Left     Current Outpatient Medications  Medication Sig Dispense Refill  . amLODipine (NORVASC) 5 MG tablet TAKE 1 TABLET BY MOUTH EVERY DAY 90 tablet 2  . apixaban (ELIQUIS) 2.5 MG TABS tablet Take 2.5 mg by mouth 2 (two) times daily.    . blood glucose meter kit and supplies Dispense based on patient and insurance preference. Use up to four times daily as directed. (FOR ICD-10 E10.9, E11.9). 1 each 0  . carvedilol (COREG) 25 MG tablet Take 1 tablet (25 mg total) by mouth 2 (two) times daily with a meal. 180 tablet 0  . cholecalciferol (VITAMIN D) 1000 units tablet Take 1,000 Units by mouth daily.    . cyanocobalamin 100 MCG tablet Take 500 mcg by mouth daily.    . furosemide (LASIX) 40 MG tablet Take 2 tablets (80  mg) in the morning and 1 tablet (40 mg) in the afternoon (Patient taking differently: Take 40-80 mg by mouth See admin instructions. Take 2 tablets (80 mg) in the morning and 1 tablet (40 mg) in the afternoon) 270 tablet 3  . gabapentin (NEURONTIN) 300 MG capsule Take 100 mg by mouth See admin instructions. Take 100 mg in the morning and 200 mg in the evening    . insulin regular (NOVOLIN R,HUMULIN R) 100 units/mL injection Inject 45 Units into the skin 2 (two) times daily before a meal.    . ipratropium (ATROVENT HFA) 17 MCG/ACT inhaler Inhale 2 puffs into the lungs every 4 (four) hours as needed for wheezing. 1 Inhaler 12  . KLOR-CON M20 20 MEQ tablet TAKE 1 TABLET BY MOUTH EVERY DAY 90 tablet 3  . loratadine (CLARITIN) 10 MG tablet Take 10 mg by mouth  daily.    . losartan (COZAAR) 100 MG tablet TAKE 1 TABLET BY MOUTH EVERY DAY 90 tablet 3  . Magnesium Oxide 400 MG CAPS Take 840 mg by mouth every evening.     . Multiple Vitamin (MULTIVITAMIN WITH MINERALS) TABS Take 1 tablet by mouth daily.    Marland Kitchen omeprazole (PRILOSEC) 20 MG capsule Take 20 mg by mouth 2 (two) times daily before a meal.    . pravastatin (PRAVACHOL) 80 MG tablet TAKE 1 TABLET (80 MG TOTAL) BY MOUTH DAILY. 90 tablet 3  . Semaglutide (OZEMPIC) 1 MG/DOSE SOPN Inject 1 mg into the skin once a week.    . Iron Combinations (IRON COMPLEX PO) Take 2 capsules by mouth daily.    . sildenafil (REVATIO) 20 MG tablet Take 5 tablets (100 mg total) by mouth as needed (for sexual activity). (Patient not taking: Reported on 04/05/2017) 50 tablet 3   No current facility-administered medications for this visit.     Allergies  Allergen Reactions  . Lipitor [Atorvastatin Calcium] Other (See Comments)    Reaction unknown    Review of Systems negative except from HPI and PMH  Physical Exam BP 128/68 (BP Location: Left Arm, Cuff Size: Large)   Pulse 82   Ht 5' 10"  (1.778 m)   Wt 244 lb 6.4 oz (110.9 kg)   BMI 35.07 kg/m  Well developed and  nourished in no acute distress HENT normal Neck supple with JVP-flat Clear Regular rate and rhythm, no murmurs or gallops Abd-soft with active BS No Clubbing cyanosis 2+ edema Skin-warm and dry A & Oriented  Grossly normal sensory and motor function    ECG demonstrates AV pacing 22/19/47  Assessment and  Plan Sinus node dysfunction  Junctional rhythm  Pacemaker-Medtronic  Atrial fibrillation  Ventricular Tachycardia nonsustained   Ischemic heart disease with prior bypass\  HFpEF   DOE may or may not be an ischemic equivalent.  He is significantly volume overloaded.  Discussions on salt and water balance were not particularly productive.  Encouraging him to exercise within the limits of what is comfortable was also not particularly productive,  No bleeding on the lower dose of Eliquis.  He is aware that he is at lower benefit tier  I further mentioned to him about a remark  that he had made to a nurse and asked him to refrain from such remarks (response to question regarding ED medications) and that I would appreciate if you would refrain from since remarks.  That was also not particularly productive.  He walked out without receiving his AVS.    We spent more than 50% of our >25 min visit in face to face counseling regarding the above

## 2017-04-05 NOTE — Patient Instructions (Signed)
Medication Instructions:  Your physician recommends that you continue on your current medications as directed. Please refer to the Current Medication list given to you today.  Labwork: None ordered.  Testing/Procedures: None ordered.  Follow-Up: Your physician recommends that you schedule a follow-up appointment in:   One Year with Dr Caryl Comes  6 months with the device clinic   Any Other Special Instructions Will Be Listed Below (If Applicable).     If you need a refill on your cardiac medications before your next appointment, please call your pharmacy.

## 2017-04-06 LAB — CUP PACEART INCLINIC DEVICE CHECK
Battery Remaining Longevity: 59 mo
Battery Voltage: 2.77 V
Brady Statistic AS VP Percent: 0 %
Date Time Interrogation Session: 20190322143514
Implantable Lead Implant Date: 20130605
Implantable Lead Implant Date: 20141022
Implantable Lead Location: 753859
Implantable Lead Location: 753860
Implantable Pulse Generator Implant Date: 20130605
Lead Channel Pacing Threshold Amplitude: 0.75 V
Lead Channel Pacing Threshold Pulse Width: 0.4 ms
Lead Channel Pacing Threshold Pulse Width: 0.4 ms
Lead Channel Setting Pacing Amplitude: 2 V
Lead Channel Setting Pacing Amplitude: 2.5 V
Lead Channel Setting Pacing Pulse Width: 0.4 ms
Lead Channel Setting Sensing Sensitivity: 2.8 mV
MDC IDC MSMT BATTERY IMPEDANCE: 713 Ohm
MDC IDC MSMT LEADCHNL RA IMPEDANCE VALUE: 399 Ohm
MDC IDC MSMT LEADCHNL RA PACING THRESHOLD AMPLITUDE: 0.5 V
MDC IDC MSMT LEADCHNL RV IMPEDANCE VALUE: 500 Ohm
MDC IDC MSMT LEADCHNL RV SENSING INTR AMPL: 11.2 mV
MDC IDC STAT BRADY AP VP PERCENT: 95 %
MDC IDC STAT BRADY AP VS PERCENT: 5 %
MDC IDC STAT BRADY AS VS PERCENT: 0 %

## 2017-04-08 NOTE — Addendum Note (Signed)
Addended by: Campbell Riches on: 04/08/2017 08:10 AM   Modules accepted: Orders

## 2017-04-12 ENCOUNTER — Encounter: Payer: Self-pay | Admitting: Internal Medicine

## 2017-04-12 ENCOUNTER — Ambulatory Visit (INDEPENDENT_AMBULATORY_CARE_PROVIDER_SITE_OTHER): Payer: Self-pay | Admitting: Internal Medicine

## 2017-04-12 ENCOUNTER — Encounter: Payer: Self-pay | Admitting: Cardiovascular Disease

## 2017-04-12 VITALS — BP 130/70 | HR 96 | Ht 70.0 in | Wt 242.0 lb

## 2017-04-12 DIAGNOSIS — D500 Iron deficiency anemia secondary to blood loss (chronic): Secondary | ICD-10-CM

## 2017-04-12 DIAGNOSIS — Z7901 Long term (current) use of anticoagulants: Secondary | ICD-10-CM

## 2017-04-12 DIAGNOSIS — Z86010 Personal history of colonic polyps: Secondary | ICD-10-CM

## 2017-04-12 DIAGNOSIS — Z8719 Personal history of other diseases of the digestive system: Secondary | ICD-10-CM

## 2017-04-12 NOTE — Progress Notes (Signed)
Patient ID: Edward [name], male   DOB: 03-25-1940, 77 y.o.   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 fib on Eliquis, peripheral vascular disease, hypertension, hyperlipidemia who is seen again at the request of the [facility] to consider repeat endoscopy or colonoscopy.  He is here alone today.  He was last seen in our office in June 2015.  Today he denies specific complaint and reports he was told he may need repeat endoscopy or colonoscopy by his primary care doctor at the [place].  He is without specific complaint and reports that his reflux, heartburn and indigestion are under excellent control "as long as I take my medicine.".  He is taking omeprazole 20 mg twice daily.  He has continued oral iron.  He denies dysphagia, odynophagia, nausea and vomiting.  Denies abdominal pain.  Reports regular bowel movements without blood in his stool or melena.  He continues oral iron supplementation and so his stool stays dark but he denies melena.  He reports he has been having issues with elevated blood sugar despite increasing insulin doses.  He is also seen recently by cardiology and was found to be volume overloaded and he continues diuretics.  He is trying to watch the sodium in his diet.  He continues Eliquis though at a reduced dose 2.5 mg twice daily due to his history of iron deficiency anemia felt secondary to erosive gastritis.  His hemoglobin has been followed and has been mildly low but stable over the past year.  Most recently 12.8 g/dL  His last colonoscopy was performed by me in February 2015.  He had one 4 mm adenoma removed from the cecum.  There was diverticulosis throughout the entire colon as well as internal and external hemorrhoids.  Past Medical History:  Diagnosis Date  . Anemia    ?  . Carotid artery occlusion    . Chronic diastolic CHF (congestive heart failure) ([place]) 04/23/2012  . CKD (chronic kidney disease), stage III ([place])   . Claudication ([place])   . Coronary artery disease    s/p CABG 2013  . Diabetes ([place])   . Diabetic neuropathy (HCC)    mostly feet/ legs  . Diverticulosis   . Dyslipidemia   . Exertional shortness of breath   . GERD (gastroesophageal reflux disease)   . GI bleed    while on triple anticoag therapy  . HTN (hypertension)   . Hyperlipidemia   . Neuromuscular disorder (HCC)    neuropathy  . Pacemaker   . PAF (paroxysmal atrial fibrillation) (HCC)    s/p MAZE at time of CABG  . PVD (peripheral vascular disease) ([place])    R CEA 2011, prior PTA, left fem-pop 2014  . Stroke ([place])    vs TIA  . Tubular adenoma of colon   . Type II diabetes mellitus ([place])     Past Surgical History:  Procedure Laterality Date  . ABDOMINAL AORTAGRAM N/A 01/07/2012   Procedure: ABDOMINAL Maxcine Ham;  Surgeon: Angelia Mould, MD;  Location: [facility] CATH LAB;  Service: Cardiovascular;  Laterality: N/A;  . BALLOON DILATION N/A 02/17/2013   Procedure: BALLOON DILATION;  Surgeon: Jerene Bears, MD;  [facility];  Service: Gastroenterology;  Laterality: N/A;  . CAROTID ENDARTERECTOMY Right 03/2009  . CHOLECYSTECTOMY    . CIRCUMCISION    . COLONOSCOPY WITH PROPOFOL N/A 02/17/2013  Procedure: COLONOSCOPY WITH PROPOFOL;  Surgeon: Jerene Bears, MD;  [facility];  Service: Gastroenterology;  Laterality: N/A;  . CORONARY ARTERY BYPASS GRAFT  06/14/2011   Procedure: CORONARY ARTERY BYPASS GRAFTING (CABG);  Surgeon: Gaye Pollack, MD;  Location: [place];  Service: Open Heart Surgery;  Laterality: N/A;  Coronary Artery Bypass Graft on pump times six;  utilizing internal mammary artery and right greater saphenous vein harvested endoscopically.x 5 vessels  . ELECTROPHYSIOLOGY STUDY N/A 11/05/2012   Procedure: ELECTROPHYSIOLOGY STUDY;  Surgeon: Deboraha Sprang, MD;  Location: [facility] CATH LAB;   Service: Cardiovascular;  Laterality: N/A;  . ESOPHAGOGASTRODUODENOSCOPY (EGD) WITH PROPOFOL N/A 02/17/2013   Procedure: ESOPHAGOGASTRODUODENOSCOPY (EGD) WITH PROPOFOL;  Surgeon: Jerene Bears, MD;  [facility];  Service: Gastroenterology;  Laterality: N/A;  . FEMORAL-POPLITEAL BYPASS GRAFT Left 03/06/2012   Procedure: BYPASS GRAFT FEMORAL-POPLITEAL ARTERY;  Surgeon: Angelia Mould, MD;  Location: [place];  Service: Vascular;  Laterality: Left;  Left Femoral - Below Knee Popliteal Bypass Graft with Vein and Intraoperative Arteriogram.  . INSERT / REPLACE / REMOVE PACEMAKER  2013   /notes 11/05/2012-[employer]  . JOINT REPLACEMENT Left    left TKA  . KNEE ARTHROSCOPY Left    "I had 3" (11/05/2012)  . LEAD REVISION  11/05/2012   pacemaker/notes 11/05/2012  . LEAD REVISION N/A 11/05/2012   Procedure: LEAD REVISION;  Surgeon: Deboraha Sprang, MD;  Location: [facility] CATH LAB;  Service: Cardiovascular;  Laterality: N/A;  . MAZE  06/14/2011   Procedure: MAZE;  Surgeon: Gaye Pollack, MD;  Location: [place];  Service: Open Heart Surgery;  Laterality: N/A;  Ligate left atrial appendage  . PERMANENT PACEMAKER INSERTION N/A 06/20/2011   Procedure: PERMANENT PACEMAKER INSERTION;  Surgeon: Deboraha Sprang, MD;  Location: [facility] CATH LAB;  Service: Cardiovascular;  Laterality: N/A;  . TONSILLECTOMY AND ADENOIDECTOMY    . TOTAL KNEE ARTHROPLASTY Left     Outpatient Medications Prior to Visit  Medication Sig Dispense Refill  . amLODipine (NORVASC) 5 MG tablet TAKE 1 TABLET BY MOUTH EVERY DAY 90 tablet 2  . apixaban (ELIQUIS) 2.5 MG TABS tablet Take 2.5 mg by mouth 2 (two) times daily.    . blood glucose meter kit and supplies Dispense based on patient and insurance preference. Use up to four times daily as directed. (FOR ICD-10 E10.9, E11.9). 1 each 0  . carvedilol (COREG) 25 MG tablet Take 1 tablet (25 mg total) by mouth 2 (two) times daily with a meal. 180 tablet 0  . cholecalciferol (VITAMIN D) 1000  units tablet Take 1,000 Units by mouth daily.    . cyanocobalamin 100 MCG tablet Take 500 mcg by mouth daily.    . furosemide (LASIX) 40 MG tablet Take 2 tablets (80 mg) in the morning and 1 tablet (40 mg) in the afternoon (Patient taking differently: Take 40-80 mg by mouth See admin instructions. Take 2 tablets (80 mg) in the morning and 1 tablet (40 mg) in the afternoon) 270 tablet 3  . gabapentin (NEURONTIN) 300 MG capsule Take 100 mg by mouth See admin instructions. Take 100 mg in the morning and 200 mg in the evening    . insulin regular (NOVOLIN R,HUMULIN R) 100 units/mL injection Inject 45 Units into the skin 2 (two) times daily before a meal.    . ipratropium (ATROVENT HFA) 17 MCG/ACT inhaler Inhale 2 puffs into the lungs every 4 (four) hours as needed for wheezing. 1 Inhaler 12  .  Iron Combinations (IRON COMPLEX PO) Take 2 capsules by mouth daily.    Marland Kitchen KLOR-CON M20 20 MEQ tablet TAKE 1 TABLET BY MOUTH EVERY DAY 90 tablet 3  . loratadine (CLARITIN) 10 MG tablet Take 10 mg by mouth daily.    Marland Kitchen losartan (COZAAR) 100 MG tablet TAKE 1 TABLET BY MOUTH EVERY DAY 90 tablet 3  . Magnesium Oxide 400 MG CAPS Take 840 mg by mouth every evening.     . Multiple Vitamin (MULTIVITAMIN WITH MINERALS) TABS Take 1 tablet by mouth daily.    Marland Kitchen omeprazole (PRILOSEC) 20 MG capsule Take 20 mg by mouth 2 (two) times daily before a meal.    . pravastatin (PRAVACHOL) 80 MG tablet TAKE 1 TABLET (80 MG TOTAL) BY MOUTH DAILY. 90 tablet 3  . Semaglutide (OZEMPIC) 1 MG/DOSE SOPN Inject 1 mg into the skin once a week.    . sildenafil (REVATIO) 20 MG tablet Take 5 tablets (100 mg total) by mouth as needed (for sexual activity). 50 tablet 3   No facility-administered medications prior to visit.     Allergies  Allergen Reactions  . Lipitor [Atorvastatin Calcium] Other (See Comments)    Reaction unknown    Family History  Problem Relation Age of Onset  . Coronary artery disease Father   . Hypertension Father   .  Heart disease Father        Heart Disease before age 76  . Heart attack Mother   . Coronary artery disease Mother   . Deep vein thrombosis Mother   . Heart disease Mother        Heart Disease before age 78  . Hyperlipidemia Mother   . Hypertension Mother   . Hypertension Sister   . Heart disease Sister        Heart Disease before age 75  . Hyperlipidemia Sister   . Heart disease Brother        Heart Disease before age 63  . Colon cancer Brother 53  . Anesthesia problems Neg Hx   . Diabetes Neg Hx     Social History   Tobacco Use  . Smoking status: Former Smoker    Packs/day: 3.00    Years: 32.00    Pack years: 96.00    Types: Cigarettes    Last attempt to quit: 01/16/1979    Years since quitting: 38.2  . Smokeless tobacco: Never Used  Substance Use Topics  . Alcohol use: Yes    Alcohol/week: 1.2 oz    Types: 2 Glasses of wine per week    Comment: beer 1-2 weekly (occasional)  . Drug use: No    ROS: As per history of present illness, otherwise negative  BP 130/70   Pulse 96   Ht _0  (1.778 m)   Wt 242 lb (109.8 kg)   BMI 34.72 kg/m  Constitutional: Well-developed and well-nourished. No distress. HEENT: Normocephalic and atraumatic. Oropharynx is clear and moist. Conjunctivae are normal.  No scleral icterus. Neck: Neck supple. Trachea midline. Cardiovascular: Normal rate, regular rhythm and intact distal pulses.  Pulmonary/chest: Effort normal and breath sounds normal. No wheezing, rales or rhonchi. Abdominal: Soft, obese, nontender, nondistended. Bowel sounds active throughout.  Extremities: no clubbing, cyanosis, 2+ pitting edema to the midshin Neurological: Alert and oriented to person place and time. Skin: Skin is warm and dry.  Psychiatric: Normal mood and affect. Behavior is normal.  RELEVANT LABS AND IMAGING: CBC    Component Value Date/Time   WBC 14.4 (H)  02/12/2017 1904   RBC 4.98 02/12/2017 1904   HGB 12.8 (L) 02/12/2017 1904   HCT 38.5 (L)  02/12/2017 1904   PLT 179 02/12/2017 1904   MCV 77.3 (L) 02/12/2017 1904   MCV 74.2 (A) 02/15/2015 1345   MCH 25.7 (L) 02/12/2017 1904   MCHC 33.2 02/12/2017 1904   RDW 16.2 (H) 02/12/2017 1904   LYMPHSABS 1.1 12/16/2012 1132   MONOABS 1.1 (H) 12/16/2012 1132   EOSABS 0.4 12/16/2012 1132   BASOSABS 0.0 12/16/2012 1132    CMP     Component Value Date/Time   NA 133 (L) 02/12/2017 1904   NA 140 07/03/2016 1346   K 4.1 02/12/2017 1904   CL 98 (L) 02/12/2017 1904   CO2 18 (L) 02/12/2017 1904   GLUCOSE 217 (H) 02/12/2017 1904   BUN 30 (H) 02/12/2017 1904   BUN 16 07/03/2016 1346   CREATININE 2.19 (H) 02/12/2017 1904   CREATININE 1.76 (H) 02/06/2016 1124   CALCIUM 9.2 02/12/2017 1904   PROT 7.8 12/07/2015 1034   ALBUMIN 4.2 12/07/2015 1034   AST 25 12/07/2015 1034   ALT 17 12/07/2015 1034   ALKPHOS 88 12/07/2015 1034   BILITOT 1.1 12/07/2015 1034   GFRNONAA 28 (L) 02/12/2017 1904   GFRAA 32 (L) 02/12/2017 1904    ASSESSMENT/PLAN: 77 year old male with multiple past medical problems including but not limited to 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 fib on Eliquis, peripheral vascular disease, hypertension, hyperlipidemia who is seen again at the request of the [facility] to consider repeat endoscopy or colonoscopy.  1.  History of iron deficiency anemia/history of erosive gastritis/history of adenomatous colon polyp --he is without specific complaint and there is no evidence for ongoing GI blood loss.  His hemoglobin is stable but remains microcytic.  He has continue oral iron supplementation.  He also has continued Eliquis and even despite anticoagulation his hemoglobin has been normal and there is been no overt bleeding --No indication for repeat upper endoscopy at present.  He is due colonoscopy in February 2020 for polyp surveillance.  I recommend that he be seen in the office first before scheduling this procedure given  his medical comorbidity and making the decision whether or not repeat screening/surveillance is appropriate for him at that time.  If so his anticoagulation will need to be held. --We will continue omeprazole 20 mg twice daily before meals --To new oral iron supplementation, primary care to monitor his hemoglobin    WU:[id number], [address], [place], [place] [id number]

## 2017-04-12 NOTE — Patient Instructions (Signed)
Normal BMI (Body Mass Index- based on height and weight) is between 23 and 30. Your BMI today is Body mass index is 34.72 kg/m. Marland Kitchen Please consider follow up  regarding your BMI with your Primary Care Provider.  Please follow up in 1 year with Edward [name].

## 2017-04-15 ENCOUNTER — Other Ambulatory Visit: Payer: Self-pay | Admitting: Nurse Practitioner

## 2017-04-16 ENCOUNTER — Telehealth (HOSPITAL_COMMUNITY): Payer: Self-pay

## 2017-04-16 ENCOUNTER — Telehealth (HOSPITAL_COMMUNITY): Payer: Self-pay | Admitting: *Deleted

## 2017-04-16 NOTE — Telephone Encounter (Signed)
Patients insurance is active and benefits verified through [place] - No co-pay, deductible amount of $350.00/$350.00 has been met, out of pocket amount of $5,000/$1,689.34 has been met, 15% co-insurance, and no pre-authorization is required. Reference #[id number]

## 2017-04-16 NOTE — Telephone Encounter (Signed)
Wife of patient returned phone call in regards to insurance. Patient will be billing to [facility]. Will verify insurance and pass to RN Navigator for review.

## 2017-04-16 NOTE — Telephone Encounter (Signed)
Received notification of office referral for cardiac rehab. Reviewed medical history.  Pt cabg was in 2013.  Insurance will not cover phase II cardiac rehab for an event in 2013.  Advised that if patient would like to monitor to proceed with VA authorization. VA does not have date limitations.  Requested call back. Cherre Huger, BSN Cardiac and Training and development officer

## 2017-05-09 ENCOUNTER — Other Ambulatory Visit: Payer: Self-pay | Admitting: Cardiovascular Disease

## 2017-07-01 ENCOUNTER — Other Ambulatory Visit: Payer: Self-pay | Admitting: Cardiovascular Disease

## 2017-07-17 ENCOUNTER — Encounter: Payer: Self-pay | Admitting: Family

## 2017-08-01 ENCOUNTER — Encounter: Payer: Self-pay | Admitting: Family

## 2017-08-01 ENCOUNTER — Other Ambulatory Visit: Payer: Self-pay

## 2017-08-01 ENCOUNTER — Ambulatory Visit (INDEPENDENT_AMBULATORY_CARE_PROVIDER_SITE_OTHER): Payer: Federal, State, Local not specified - PPO | Admitting: Family

## 2017-08-01 VITALS — BP 130/81 | HR 85 | Temp 97.0°F | Resp 18 | Ht 70.0 in | Wt 245.0 lb

## 2017-08-01 DIAGNOSIS — Z9889 Other specified postprocedural states: Secondary | ICD-10-CM

## 2017-08-01 DIAGNOSIS — M79662 Pain in left lower leg: Secondary | ICD-10-CM

## 2017-08-01 DIAGNOSIS — Z95828 Presence of other vascular implants and grafts: Secondary | ICD-10-CM

## 2017-08-01 DIAGNOSIS — I779 Disorder of arteries and arterioles, unspecified: Secondary | ICD-10-CM | POA: Diagnosis not present

## 2017-08-01 DIAGNOSIS — R609 Edema, unspecified: Secondary | ICD-10-CM | POA: Diagnosis not present

## 2017-08-01 DIAGNOSIS — M7989 Other specified soft tissue disorders: Secondary | ICD-10-CM | POA: Diagnosis not present

## 2017-08-01 DIAGNOSIS — I6523 Occlusion and stenosis of bilateral carotid arteries: Secondary | ICD-10-CM

## 2017-08-01 DIAGNOSIS — Z87891 Personal history of nicotine dependence: Secondary | ICD-10-CM | POA: Diagnosis not present

## 2017-08-01 NOTE — Progress Notes (Signed)
[facility]    CC: Intermittent left lower leg swelling and pain x 2 months, history of peripheral artery occlusive disease  History of Present Illness Edward [name] is a 77 y.o. male who is s/p left femoropopliteal bypass graft with vein on 03/06/2012 by Dr. Scot Dock.   He is also s/p right CEA on 03/22/09.   He returns today with c/o 2 months hx of left lower leg swelling that improves with overnight elevation. He denies any worse dyspnea than usual, he has CHF.  He did have 1+ bilateral pretibial pitting edema when I saw him on 03-12-17  He denies claudication type symptoms with walking, denies non-healing wounds.  He had slurred speech for about a day, in 2012, no further TIA or stroke symptoms.   He states his recent stress test was normal.  Diabetic: Yes, his A1C on 02-18-17 was 9.6, was 6.9 on 07-23-17 (improved) Tobacco user: former smoker, quit in 1981  Pt meds include:  Statin :Yes Betablocker: Yes ASA: No, stopped when Eliquis started in October 2015 Other anticoagulants/antiplatelets: Eliquis for atrial fibrillation, (review of records) started after his October 2015 visit with his cardiologist, Dr. Caryl Comes. 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   Past Medical History:  Diagnosis Date  . Anemia    ?  . Carotid artery occlusion   . Chronic diastolic CHF (congestive heart failure) ([place]) 04/23/2012  . CKD (chronic kidney disease), stage III ([place])   . Claudication ([place])   . Coronary artery disease    s/p CABG 2013  . Diabetes ([place])   . Diabetic neuropathy (HCC)    mostly feet/ legs  . Diverticulosis   . Dyslipidemia   . Exertional shortness of breath   . GERD (gastroesophageal reflux disease)   . GI bleed    while on triple anticoag therapy  . HTN (hypertension)   . Hyperlipidemia   . Neuromuscular disorder (HCC)    neuropathy  . Pacemaker   . PAF (paroxysmal atrial fibrillation) (HCC)    s/p MAZE  at time of CABG  . PVD (peripheral vascular disease) ([place])    R CEA 2011, prior PTA, left fem-pop 2014  . Stroke ([place])    vs TIA  . Tubular adenoma of colon   . Type II diabetes mellitus ([place])     Social History Social History   Tobacco Use  . Smoking status: Former Smoker    Packs/day: 3.00    Years: 32.00    Pack years: 96.00    Types: Cigarettes    Last attempt to quit: 01/16/1979    Years since quitting: 38.5  . Smokeless tobacco: Never Used  Substance Use Topics  . Alcohol use: Yes    Alcohol/week: 1.2 oz    Types: 2 Glasses of wine per week    Comment: beer 1-2 weekly (occasional)  . Drug use: No    Family History Family History  Problem Relation Age of Onset  . Coronary artery disease Father   . Hypertension Father   . Heart disease Father        Heart Disease before age 14  . Heart attack Mother   . Coronary artery disease Mother   . Deep vein thrombosis Mother   . Heart disease Mother        Heart Disease before age 58  . Hyperlipidemia Mother   . Hypertension Mother   . Hypertension Sister   . Heart disease Sister  Heart Disease before age 57  . Hyperlipidemia Sister   . Heart disease Brother        Heart Disease before age 35  . Colon cancer Brother 31  . Anesthesia problems Neg Hx   . Diabetes Neg Hx     Past Surgical History:  Procedure Laterality Date  . ABDOMINAL AORTAGRAM N/A 01/07/2012   Procedure: ABDOMINAL Maxcine Ham;  Surgeon: Angelia Mould, MD;  Location: [facility] CATH LAB;  Service: Cardiovascular;  Laterality: N/A;  . BALLOON DILATION N/A 02/17/2013   Procedure: BALLOON DILATION;  Surgeon: Jerene Bears, MD;  Location: [facility] ENDOSCOPY;  Service: Gastroenterology;  Laterality: N/A;  . CAROTID ENDARTERECTOMY Right 03/2009  . CHOLECYSTECTOMY    . CIRCUMCISION    . COLONOSCOPY WITH PROPOFOL N/A 02/17/2013   Procedure: COLONOSCOPY WITH PROPOFOL;  Surgeon: Jerene Bears, MD;  Location: [facility];  Service: Gastroenterology;  Laterality:  N/A;  . CORONARY ARTERY BYPASS GRAFT  06/14/2011   Procedure: CORONARY ARTERY BYPASS GRAFTING (CABG);  Surgeon: Gaye Pollack, MD;  Location: [name];  Service: Open Heart Surgery;  Laterality: N/A;  Coronary Artery Bypass Graft on pump times six;  utilizing internal mammary artery and right greater saphenous vein harvested endoscopically.x 5 vessels  . ELECTROPHYSIOLOGY STUDY N/A 11/05/2012   Procedure: ELECTROPHYSIOLOGY STUDY;  Surgeon: Deboraha Sprang, MD;  Location: [facility] CATH LAB;  Service: Cardiovascular;  Laterality: N/A;  . ESOPHAGOGASTRODUODENOSCOPY (EGD) WITH PROPOFOL N/A 02/17/2013   Procedure: ESOPHAGOGASTRODUODENOSCOPY (EGD) WITH PROPOFOL;  Surgeon: Jerene Bears, MD;  Location: [facility];  Service: Gastroenterology;  Laterality: N/A;  . FEMORAL-POPLITEAL BYPASS GRAFT Left 03/06/2012   Procedure: BYPASS GRAFT FEMORAL-POPLITEAL ARTERY;  Surgeon: Angelia Mould, MD;  Location: [place];  Service: Vascular;  Laterality: Left;  Left Femoral - Below Knee Popliteal Bypass Graft with Vein and Intraoperative Arteriogram.  . INSERT / REPLACE / REMOVE PACEMAKER  2013   /notes 11/05/2012-[employer]  . JOINT REPLACEMENT Left    left TKA  . KNEE ARTHROSCOPY Left    "I had 3" (11/05/2012)  . LEAD REVISION  11/05/2012   pacemaker/notes 11/05/2012  . LEAD REVISION N/A 11/05/2012   Procedure: LEAD REVISION;  Surgeon: Deboraha Sprang, MD;  Location: [facility] CATH LAB;  Service: Cardiovascular;  Laterality: N/A;  . MAZE  06/14/2011   Procedure: MAZE;  Surgeon: Gaye Pollack, MD;  Location: [place];  Service: Open Heart Surgery;  Laterality: N/A;  Ligate left atrial appendage  . PERMANENT PACEMAKER INSERTION N/A 06/20/2011   Procedure: PERMANENT PACEMAKER INSERTION;  Surgeon: Deboraha Sprang, MD;  Location[facility] CATH LAB;  Service: Cardiovascular;  Laterality: N/A;  . TONSILLECTOMY AND ADENOIDECTOMY    . TOTAL KNEE ARTHROPLASTY Left     Allergies  Allergen Reactions  . Lipitor [Atorvastatin Calcium] Other (See  Comments)    Reaction unknown    Current Outpatient Medications  Medication Sig Dispense Refill  . amLODipine (NORVASC) 5 MG tablet TAKE 1 TABLET BY MOUTH EVERY DAY 90 tablet 2  . apixaban (ELIQUIS) 2.5 MG TABS tablet Take 1.5 mg by mouth 2 (two) times daily.     . blood glucose meter kit and supplies Dispense based on patient and insurance preference. Use up to four times daily as directed. (FOR ICD-10 E10.9, E11.9). 1 each 0  . carvedilol (COREG) 25 MG tablet Take 1 tablet (25 mg total) by mouth 2 (two) times daily with a meal. 180 tablet 1  . cholecalciferol (VITAMIN D) 1000 units tablet Take  1,000 Units by mouth daily.    . cyanocobalamin 100 MCG tablet Take 500 mcg by mouth daily.    . furosemide (LASIX) 40 MG tablet TAKE 2 TABLETS BY MOUTH EVERY MORNING AND TAKE 1 TABLET IN THE AFTERNOON 270 tablet 2  . gabapentin (NEURONTIN) 300 MG capsule Take 100 mg by mouth See admin instructions. Take 100 mg in the morning and 200 mg in the evening    . insulin regular (NOVOLIN R,HUMULIN R) 100 units/mL injection Inject 45 Units into the skin 2 (two) times daily before a meal.    . ipratropium (ATROVENT HFA) 17 MCG/ACT inhaler Inhale 2 puffs into the lungs every 4 (four) hours as needed for wheezing. 1 Inhaler 12  . Iron Combinations (IRON COMPLEX PO) Take 2 capsules by mouth daily.    Marland Kitchen KLOR-CON M20 20 MEQ tablet TAKE 1 TABLET BY MOUTH EVERY DAY 90 tablet 3  . loratadine (CLARITIN) 10 MG tablet Take 10 mg by mouth daily.    Marland Kitchen losartan (COZAAR) 100 MG tablet TAKE 1 TABLET BY MOUTH EVERY DAY 90 tablet 3  . Magnesium Oxide 400 MG CAPS Take 840 mg by mouth every evening.     . Multiple Vitamin (MULTIVITAMIN WITH MINERALS) TABS Take 1 tablet by mouth daily.    Marland Kitchen omeprazole (PRILOSEC) 20 MG capsule Take 20 mg by mouth 2 (two) times daily before a meal.    . pravastatin (PRAVACHOL) 80 MG tablet TAKE 1 TABLET (80 MG TOTAL) BY MOUTH DAILY. 90 tablet 3  . Semaglutide (OZEMPIC) 1 MG/DOSE SOPN Inject 1 mg  into the skin once a week.    . sildenafil (REVATIO) 20 MG tablet Take 5 tablets (100 mg total) by mouth as needed (for sexual activity). 50 tablet 3   No current facility-administered medications for this visit.     ROS: See HPI for pertinent positives and negatives.   Physical Examination  Vitals:   08/01/17 1506  BP: 130/81  Pulse: 85  Resp: 18  Temp: (!) 97 F (36.1 C)  TempSrc: Oral  SpO2: 96%  Weight: 245 lb (111.1 kg)  Height: _0  (1.778 m)   Body mass index is 35.15 kg/m.  General: A&O x 3, WDWN, obese male. Gait: slow, steady, using a cane  HENT: No gross abnormalities.  Eyes: PERRLA. Pulmonary: Respirations are non labored, CTAB, fair air movement in all fields, no rales, rhonchi, or wheezes.  Cardiac: regular rhythm and rate, no detected murmur.Pacemaker palpated left upper chest.       Carotid Bruits Right Left   Negative Negative   Radial pulses are 2+ palpable bilaterally   Adominal aortic pulse is not palpable                            VASCULAR EXAM: Extremities without ischemic changes, without Gangrene; without open wounds. Pitting and non pitting edema bilateral pretibial and feet: 1+ right, 2+ left. The dorsal edema in feet is consistent with dependent edema. Mild peau d' orange skin at left anterior lower leg, mild erythema See above photo  LE Pulses Right Left       FEMORAL  2+ palpable  2+ palpable        POPLITEAL  not palpable   1+ palpable       POSTERIOR TIBIAL  1+palpable   not palpable        DORSALIS PEDIS      ANTERIOR TIBIAL 1+ palpable  2+ palpable    Abdomen: soft, NT, no palpable masses. Large panus Skin: no rashes, no cellulitis, no ulcers noted. Musculoskeletal: no muscle wasting or atrophy.  Neurologic: A&O X 3; appropriate affect, Sensation is normal; MOTOR FUNCTION:  moving all extremities equally, motor  strength 5/5 throughout. Speech is fluent/normal. CN 2-12 intact. Psychiatric: Thought content is normal, mood appropriate for clinical situation.      ASSESSMENT: Edward [name] is a 77 y.o. male who is s/p left femoropopliteal bypass graft with vein on 03/06/2012.  He is also s/p right CEA on 03/22/09.   He had a TIA in 2012, none subsequently, no hx of stroke. He has no claudication sx's with walking, no signs of ischemia in his feet/legs.  He returns today concerned that the swelling in his left lower leg and foot (right lower leg and foot also have swelling, not quite as much as left) may be related to his arterial occlusive disease.  However, despite 2+ pitting edema at the dorsal aspect of his left foot, his left DP pulse is 2+ palpable, mor than adequate arterial perfusion to left lower extremity.  It seems that he has not been adequately elevating his feet above his heart overnight and for 20 minutes, 3-4x during the day. His large panus contributes to lower extremity venous hypertension.  His CHF contributes to lower extremity edema.  Pt and wife instructed how to adequately elevate his feet and legs to minimize the bilateral dependent edema.    Pt had normal ABI's in February 2019.  Will evaluate for left LE DVT with duplex tomorrow, pt's wife has a medial appointment to get to within the hour.    DATA  Carotid Duplex (03/12/17): WIdely patent right carotid endarterectomy without evidence of restenosis or hyperplasia.  1-39% left internal carotid artery stenosis. Right vertebral artery flow is antegrade/restrictive  Left vertebral artery flow is antegrade.  Bilateral subclavian artery waveforms are normal. Right vertebral artery demonstrates antegrade flow/restrictive; previous exam indicates functionally occluded. No other significant changes compared to prior exams on 03-03-15 and 03-06-16.  Left LE Arterial Duplex (03/12/17): Patent left femoropopliteal  arterial bypass graft without evidence of restenosis, bi and triphasic waveforms. No significant change compared to examson 03-03-15 and 03-06-16.   ABI (Date: 03/12/2017):  R:  ? ABI: 1.19 (was 1.22 on 03-06-16),  ? PT: tri ? DP: tri ? TBI:  0.98 (was [place])  L:  ? ABI: 1.13 (was 1.24),  ? PT: tri ? DP: tri ? TBI: 1.02 (was [place])    PLAN:  Schedule DVT duplex of left LE tomorrow, see me or PA afterward to discuss results (my schedule is fully booked tomorrow).   The patient will follow up in [place] with the following tests: ABI's,left LE arterial duplex, and carotid duplex.  I discussed in depth with the patient the nature of atherosclerosis, and emphasized the importance of maximal medical management including strict control of blood pressure, blood glucose, and lipid levels, obtaining regular exercise, and continued cessation of smoking.  The patient is aware that without maximal medical management the underlying atherosclerotic disease process will  progress, limiting the benefit of any interventions.  The patient was given information about PAD including signs, symptoms, treatment, what symptoms should prompt the patient to seek immediate medical care, and risk reduction measures to take.  Clemon Chambers, RN, MSN, FNP-C [facility] [employer] Phone: [phone]  Clinic MD: [facility]   08/01/17 3:42 PM

## 2017-08-01 NOTE — Patient Instructions (Signed)

## 2017-08-02 ENCOUNTER — Ambulatory Visit: Payer: Federal, State, Local not specified - PPO | Admitting: Cardiovascular Disease

## 2017-08-02 ENCOUNTER — Encounter: Payer: Self-pay | Admitting: Cardiovascular Disease

## 2017-08-02 ENCOUNTER — Ambulatory Visit (HOSPITAL_COMMUNITY)
Admission: RE | Admit: 2017-08-02 | Discharge: 2017-08-02 | Disposition: A | Discharge status: Home / Self Care | Payer: Federal, State, Local not specified - PPO | Source: Ambulatory Visit | Attending: Family | Admitting: Family

## 2017-08-02 ENCOUNTER — Ambulatory Visit: Payer: Federal, State, Local not specified - PPO | Admitting: Physician Assistant

## 2017-08-02 ENCOUNTER — Encounter: Payer: Self-pay | Admitting: Physician Assistant

## 2017-08-02 VITALS — BP 133/85 | HR 95 | Temp 96.8°F | Resp 18 | Ht 70.0 in | Wt 245.0 lb

## 2017-08-02 VITALS — BP 132/72 | HR 86 | Ht 70.0 in | Wt 245.1 lb

## 2017-08-02 DIAGNOSIS — R600 Localized edema: Secondary | ICD-10-CM | POA: Diagnosis not present

## 2017-08-02 DIAGNOSIS — I5032 Chronic diastolic (congestive) heart failure: Secondary | ICD-10-CM

## 2017-08-02 DIAGNOSIS — Z9889 Other specified postprocedural states: Secondary | ICD-10-CM | POA: Diagnosis not present

## 2017-08-02 DIAGNOSIS — I70219 Atherosclerosis of native arteries of extremities with intermittent claudication, unspecified extremity: Secondary | ICD-10-CM

## 2017-08-02 DIAGNOSIS — I2510 Atherosclerotic heart disease of native coronary artery without angina pectoris: Secondary | ICD-10-CM | POA: Diagnosis not present

## 2017-08-02 DIAGNOSIS — I739 Peripheral vascular disease, unspecified: Secondary | ICD-10-CM

## 2017-08-02 DIAGNOSIS — M79662 Pain in left lower leg: Secondary | ICD-10-CM | POA: Diagnosis not present

## 2017-08-02 DIAGNOSIS — M7989 Other specified soft tissue disorders: Secondary | ICD-10-CM | POA: Diagnosis not present

## 2017-08-02 NOTE — Patient Instructions (Signed)
Medication Instructions:  Your physician recommends that you continue on your current medications as directed. Please refer to the Current Medication list given to you today.   Labwork: TODAY - cholesterol, liver panel, basic metabolic panel   Testing/Procedures: None Ordered   Follow-Up: Your physician wants you to follow-up in: 6 months with Dr. Acie Fredrickson. You will receive a reminder letter in the mail two months in advance. If you don't receive a letter, please call our office to schedule the follow-up appointment.   If you need a refill on your cardiac medications before your next appointment, please call your pharmacy.   Thank you for choosing [facility]! Christen Bame, RN [phone]

## 2017-08-02 NOTE — Progress Notes (Signed)
CARDIOLOGY OFFICE NOTE  Date:  02/15/2016    Edward [name] Date of Birth: 11/09/1940 Medical Record #[id number]  PCP:  Molli Barrows, FNP    Cardiologist:  Nahser       Chief Complaint  Patient presents with  . Shortness of Breath        Previous notes.  Edward [name] is a 77 y.o. male who presents today for a follow up visit. Seen for Dr. Acie Fredrickson.   He has a history of afib s/p MAZE w/ CABG 05/2011 (LIMA-LAD, SVG-D1, SVG-D2, SVG-CFX, SVG-PDA-PL), DM, HTN, HLD, PAD s/p R-CEA &L fem-pop, TIA, & anemia. He has PPM in place - followed by Dr. Caryl Comes.    Seen about a week ago as a work in by [employer], [employer] for shortness of breath and wheezing. There was felt to be some component of volume overload - his diuretics were increased for a few days. He was to come back to [place] for further follow up.   Comes in today. Here with his wife. He is quite abrasive. Very hard to get any history out of him. Will not look at me. Hard to say if he is better. Weight is down. Seems more bothered by cough. Has been to the [place] noted. Given inhaler. Cough not productive. Has chest discomfort - sounds like it is from coughing but again - he really does not wish to give me any history.   Spoke with Dr. Acie Fredrickson here in the office - apparently this patient is not to see male providers due to past behavior.   [name]. 31, 2018  I was asked to see Edward [name] today on an urgent basis.  He was seen by Cecille Rubin.    She asked me to see him because he was being belligerent with her .  Has lost 17 lbs with lasix 40 BID . Still complains of cough.   CT of chest in March 2017 showed small pleural effusion .  Mild COPD,  Pulmonary art. Enlargement .  Coughing up brown sputum.  Advised him to see pulmonary  - he wants to ask the [facility] to send him to the [place] pulmonologist.   May 03, 2016:  Edward [name] is seen today for follow up of his worsening dyspnea.  Feeling much better . No CP ,     Oct. 1, 2018:   Seen for DOE ,  Still has some DOE ,  Also has some lightheadness right after he stands up .   January 03, 2017:  Edward [name] is seen today for follow-up visit. Is a bit more short of breath with the cold Denies any angina. Has asked for a prescription for sildenafil.  August 02, 2017:  Edward [name] is seen today for follow-up of his congestive heart failure and coronary artery disease. He has a history of afib s/p MAZE w/ CABG 05/2011 (LIMA-LAD, SVG-D1, SVG-D2, SVG-CFX, SVG-PDA-PL), DM, HTN, HLD, PAD s/p R-CEA &L fem-pop, TIA, & anemia. He has PPM in place - followed by Dr. Caryl Comes.   hes been to several doctor visits today  Wife broke her elbow 3 weeks ago   Overall not doing as well as he would like  Has lots of leg weakness and fatigue  Follows up with nephrology also  Wt. Today is 73       Past Medical History:  Diagnosis Date  . Anemia    ?  . Carotid artery occlusion   . Chronic diastolic CHF (congestive heart  failure) ([facility]) 04/23/2012  . Claudication ([place])   . Coronary artery disease    s/p CABG 2013  . Diabetic neuropathy (HCC)    mostly feet/ legs  . Dyslipidemia   . Exertional shortness of breath   . GERD (gastroesophageal reflux disease)   . GI bleed    while on triple anticoag therapy  . HTN (hypertension)   . Hyperlipidemia   . Neuromuscular disorder (HCC)    neuropathy  . Pacemaker   . PAF (paroxysmal atrial fibrillation) (HCC)    s/p MAZE at time of CABG  . PVD (peripheral vascular disease) ([place])    R CEA 2011, prior PTA, left fem-pop 2014  . Stroke ([place])    vs TIA  . Type II diabetes mellitus ([place])          Past Surgical History:  Procedure Laterality Date  . ABDOMINAL AORTAGRAM N/A 01/07/2012   Procedure: ABDOMINAL Maxcine Ham;  Surgeon: Angelia Mould, MD;  Location[facility] C[facility] LAB;  Service: Cardiovascular;  Laterality: N/A;  . BALLOON DILATION N/A 02/17/2013   Procedure: BALLOON DILATION;   Surgeon: Jerene Bears, MD;  Location: [facility] ENDOSCOPY;  Service: Gastroenterology;  Laterality: N/A;  . CAROTID ENDARTERECTOMY Right 03/2009  . CHOLECYSTECTOMY    . CIRCUMCISION    . COLONOSCOPY WITH PROPOFOL N/A 02/17/2013   Procedure: COLONOSCOPY WITH PROPOFOL;  Surgeon: Jerene Bears, MD;  Location: [facility] ENDOSCOPY;  Service: Gastroenterology;  Laterality: N/A;  . CORONARY ARTERY BYPASS GRAFT  06/14/2011   Procedure: CORONARY ARTERY BYPASS GRAFTING (CABG);  Surgeon: Gaye Pollack, MD;  Location: [facility];  Service: Open Heart Surgery;  Laterality: N/A;  Coronary Artery Bypass Graft on pump times six;  utilizing internal mammary artery and right greater saphenous vein harvested endoscopically.x 5 vessels  . ELECTROPHYSIOLOGY STUDY N/A 11/05/2012   Procedure: ELECTROPHYSIOLOGY STUDY;  Surgeon: Deboraha Sprang, MD;  Location: [facility] CATH LAB;  Service: Cardiovascular;  Laterality: N/A;  . ESOPHAGOGASTRODUODENOSCOPY (EGD) WITH PROPOFOL N/A 02/17/2013   Procedure: ESOPHAGOGASTRODUODENOSCOPY (EGD) WITH PROPOFOL;  Surgeon: Jerene Bears, MD;  Location: [facility] ENDOSCOPY;  Service: Gastroenterology;  Laterality: N/A;  . FEMORAL-POPLITEAL BYPASS GRAFT Left 03/06/2012   Procedure: BYPASS GRAFT FEMORAL-POPLITEAL ARTERY;  Surgeon: Angelia Mould, MD;  Location: [place];  Service: Vascular;  Laterality: Left;  Left Femoral - Below Knee Popliteal Bypass Graft with Vein and Intraoperative Arteriogram.  . INSERT / REPLACE / REMOVE PACEMAKER  2013   /notes 11/05/2012-[employer]  . JOINT REPLACEMENT Left    left TKA  . KNEE ARTHROSCOPY Left    "I had 3" (11/05/2012)  . LEAD REVISION  11/05/2012   pacemaker/notes 11/05/2012  . LEAD REVISION N/A 11/05/2012   Procedure: LEAD REVISION;  Surgeon: Deboraha Sprang, MD;  Location: [facility] CATH LAB;  Service: Cardiovascular;  Laterality: N/A;  . MAZE  06/14/2011   Procedure: MAZE;  Surgeon: Gaye Pollack, MD;  Location: [place];  Service: Open Heart Surgery;  Laterality:  N/A;  [place] left atrial appendage  . PERMANENT PACEMAKER INSERTION N/A 06/20/2011   Procedure: PERMANENT PACEMAKER INSERTION;  Surgeon: Deboraha Sprang, MD;  Location: [facility] CATH LAB;  Service: Cardiovascular;  Laterality: N/A;  . TONSILLECTOMY AND ADENOIDECTOMY    . TOTAL KNEE ARTHROPLASTY Left      Medications:       Current Outpatient Prescriptions  Medication Sig Dispense Refill  . acetaminophen (TYLENOL) 500 MG tablet Take 500 mg by mouth every 6 (six) hours as needed for mild pain  or moderate pain.     Marland Kitchen amLODipine (NORVASC) 5 MG tablet Take 1 tablet (5 mg total) by mouth daily. 90 tablet 3  . apixaban (ELIQUIS) 5 MG TABS tablet Take 1 tablet (5 mg total) by mouth 2 (two) times daily. 180 tablet 3  . benzonatate (TESSALON) 100 MG capsule Take 100 mg by mouth 4 (four) times daily as needed for cough.    . carvedilol (COREG) 25 MG tablet TAKE 1 TABLET (25 MG TOTAL) BY MOUTH 2 (TWO) TIMES DAILY. 180 tablet 3  . diphenhydramine-acetaminophen (TYLENOL PM) 25-500 MG TABS Take 1 tablet by mouth at bedtime as needed (sleep).     . famotidine (PEPCID) 20 MG tablet Take 20 mg by mouth daily.    . furosemide (LASIX) 40 MG tablet TAKE 2 TABLETS BY MOUTH EVERY DAY 180 tablet 2  . glipiZIDE (GLUCOTROL) 5 MG tablet Take 5 mg by mouth 3 (three) times daily.     Marland Kitchen guaiFENesin (ROBITUSSIN) 100 MG/5ML SOLN Take 5 mLs by mouth every 4 (four) hours as needed for cough or to loosen phlegm.    . Iron Combinations (IRON COMPLEX PO) Take 2 capsules by mouth daily.    Marland Kitchen KLOR-CON M20 20 MEQ tablet TAKE 1 TABLET BY MOUTH EVERY DAY 90 tablet 3  . L-Methylfolate-Algae-B12-B6 (METANX) 3-90.314-2-35 MG CAPS TAKE ONE CAPSULE BY MOUTH EVERY DAY 15 capsule 0  . liraglutide (VICTOZA) 18 MG/3ML SOPN Inject 18 mg into the skin daily.    Marland Kitchen loratadine (CLARITIN) 10 MG tablet Take 10 mg by mouth daily.    Marland Kitchen losartan (COZAAR) 100 MG tablet TAKE 1 TABLET BY MOUTH EVERY DAY 90 tablet 3  . Magnesium Oxide  420 MG TABS Take 420 mg by mouth daily.    . metFORMIN (GLUCOPHAGE) 1000 MG tablet Take 1,000 mg by mouth 2 (two) times daily with a meal.    . Multiple Vitamin (MULTIVITAMIN WITH MINERALS) TABS Take 1 tablet by mouth daily.    Marland Kitchen NOVOTWIST 32G X 5 MM MISC     . omeprazole (PRILOSEC) 40 MG capsule TAKE 1 CAPSULE 30 TO 60 MINUTES BEFORE YOUR FIRST AND LAST MEALS OF THE DAY 60 capsule 2  . ONETOUCH DELICA LANCETS FINE MISC 1 each by other route daily. 1/day 250.62 100 each 3  . ONETOUCH VERIO test strip     . pravastatin (PRAVACHOL) 80 MG tablet TAKE 1 TABLET (80 MG TOTAL) BY MOUTH DAILY. 90 tablet 3  . triamcinolone cream (KENALOG) 0.1 % Apply 1 application topically as needed (rash).      No current facility-administered medications for this visit.     Allergies:      Allergies  Allergen Reactions  . Lipitor [Atorvastatin Calcium] Other (See Comments)    Reaction unknown    Social History: The patient  reports that he quit smoking about 37 years ago. His smoking use included Cigarettes. He has a 96.00 pack-year smoking history. He has never used smokeless tobacco. He reports that he drinks about 1.2 oz of alcohol per week . He reports that he does not use drugs.   Family History: The patient's family history includes Coronary artery disease in his father and mother; Deep vein thrombosis in his mother; Heart attack in his mother; Heart disease in his brother, father, mother, and sister; Hyperlipidemia in his mother and sister; Hypertension in his father, mother, and sister.   Review of Systems: Please see the history of present illness.   Otherwise, the review of  systems is positive for none.   All other systems are reviewed and negative.   Physical Exam: Blood pressure 132/72, pulse 86, height 5' 10"  (1.778 m), weight 245 lb 1.9 oz (111.2 kg), SpO2 96 %.  GEN:   Obese , elderly male.  HEENT: Normal NECK: No JVD; No carotid bruits LYMPHATICS: No  lymphadenopathy CARDIAC: RRR ,   Soft murmur  RESPIRATORY:  Clear to auscultation without rales, wheezing or rhonchi  ABDOMEN: Soft, non-tender, non-distended MUSCULOSKELETAL:   1-2 + pitting edema   SKIN: Warm and dry NEUROLOGIC:  Alert and oriented x 3    LABORATORY DATA:  EKG:     Recent Labs       Lab Results  Component Value Date   WBC 9.5 12/07/2015   HGB 12.6 (L) 12/07/2015   HCT 39.3 12/07/2015   PLT 214 12/07/2015   GLUCOSE 182 (H) 02/06/2016   CHOL 101 12/07/2015   TRIG 142 12/07/2015   HDL 31 (L) 12/07/2015   LDLCALC 42 12/07/2015   ALT 17 12/07/2015   AST 25 12/07/2015   NA 136 02/06/2016   K 4.8 02/06/2016   CL 100 02/06/2016   CREATININE 1.76 (H) 02/06/2016   BUN 20 02/06/2016   CO2 20 02/06/2016   TSH 3.28 12/07/2015   PSA 0.59 09/20/2012   INR 0.94 02/29/2012   HGBA1C 6.9 (H) 09/22/2013   MICROALBUR 56.2 (H) 07/08/2013      BNP (last 3 results)  Recent Labs (within last 365 days)   Recent Labs  02/06/16 1124  BNP 120.2*      ProBNP (last 3 results) Recent Labs (within last 365 days)  No results for input(s): PROBNP in the last 8760 hours.     Other Studies Reviewed Today:  ECHO: 01/26/2016 - Left ventricle: The cavity size was normal. Wall thickness was increased in a pattern of moderate LVH. There was focal basal hypertrophy. Systolic function was normal. The estimated ejection fraction was in the range of 50% to 55%. - Mitral valve: Moderately calcified annulus. - Left atrium: The atrium was mildly dilated. - Right atrium: The atrium was moderately to severely dilated.  MYOVIEW: 02/25/2013 Overall Impression: This is a low risk scan with no significant change since January, 2014. There is a small/moderate area of scar at the apex. This has been noted in the past. There is no ischemia. LV Ejection Fraction: 53%. LV Wall Motion: Mild hypokinesis at the  apex.  Assessment/Plan:  1. Dyspnea on exertion:    Chronic diastolic CHF ,  Echo in 5056 shows normal LV function   , complicated by CKD  Suggest lounge Dr. Leg rest.    2. CAD: no CP     3. CKD stage III:    Managed by nephrology   4. Wheezing:    5. PAF .  In nsr today   6.  Erectile dysfunction:    Current medicines are reviewed with the patient today.  The patient does not have concerns regarding medicines other than what has been noted above.  The following changes have been made:  See above.  Labs/ tests ordered today include:   No orders of the defined types were placed in this encounter.       Mertie Moores, MD  08/02/2017 4:25 PM    [facility],  [place], [place]  [address] Pager [phone] Phone: [phone]; Fax: [phone]

## 2017-08-02 NOTE — Progress Notes (Signed)
Established Previous Bypass   History of Present Illness   Edward [name] is a 77 y.o. (12/06/1940) male who returns to clinic to go over left lower extremity DVT study.  Surgical history significant for left femoral to popliteal bypass with vein in February 2014 by Dr. Scot Dock.  Patient also has had a right carotid endarterectomy in March 2011.  Patient states he has noticed progressive edema of left lower extremity over the past 2 months.  Patient states he has tried compression stockings in the past but "they are never a good fit."'  He does not make any extra effort during the day to elevate his legs.  He denies any history of DVT or venous ulcerations.  Patient states that the edema of his left lower extremity is tolerable.  He is not interested in trying to wear compression.  He is taking Eliquis daily.  Current Outpatient Medications  Medication Sig Dispense Refill  . amLODipine (NORVASC) 5 MG tablet TAKE 1 TABLET BY MOUTH EVERY DAY 90 tablet 2  . apixaban (ELIQUIS) 2.5 MG TABS tablet Take 1.5 mg by mouth 2 (two) times daily.     . blood glucose meter kit and supplies Dispense based on patient and insurance preference. Use up to four times daily as directed. (FOR ICD-10 E10.9, E11.9). 1 each 0  . carvedilol (COREG) 25 MG tablet Take 1 tablet (25 mg total) by mouth 2 (two) times daily with a meal. 180 tablet 1  . cholecalciferol (VITAMIN D) 1000 units tablet Take 1,000 Units by mouth daily.    . cyanocobalamin 100 MCG tablet Take 500 mcg by mouth daily.    . furosemide (LASIX) 40 MG tablet TAKE 2 TABLETS BY MOUTH EVERY MORNING AND TAKE 1 TABLET IN THE AFTERNOON 270 tablet 2  . gabapentin (NEURONTIN) 300 MG capsule Take 100 mg by mouth See admin instructions. Take 100 mg in the morning and 200 mg in the evening    . insulin regular (NOVOLIN R,HUMULIN R) 100 units/mL injection Inject 45 Units into the skin 2 (two) times daily before a meal.    . ipratropium (ATROVENT HFA) 17 MCG/ACT  inhaler Inhale 2 puffs into the lungs every 4 (four) hours as needed for wheezing. 1 Inhaler 12  . Iron Combinations (IRON COMPLEX PO) Take 2 capsules by mouth daily.    Marland Kitchen KLOR-CON M20 20 MEQ tablet TAKE 1 TABLET BY MOUTH EVERY DAY 90 tablet 3  . loratadine (CLARITIN) 10 MG tablet Take 10 mg by mouth daily.    Marland Kitchen losartan (COZAAR) 100 MG tablet TAKE 1 TABLET BY MOUTH EVERY DAY 90 tablet 3  . Magnesium Oxide 400 MG CAPS Take 840 mg by mouth every evening.     . Multiple Vitamin (MULTIVITAMIN WITH MINERALS) TABS Take 1 tablet by mouth daily.    Marland Kitchen omeprazole (PRILOSEC) 20 MG capsule Take 20 mg by mouth 2 (two) times daily before a meal.    . pravastatin (PRAVACHOL) 80 MG tablet TAKE 1 TABLET (80 MG TOTAL) BY MOUTH DAILY. 90 tablet 3  . Semaglutide (OZEMPIC) 1 MG/DOSE SOPN Inject 1 mg into the skin once a week.    . sildenafil (REVATIO) 20 MG tablet Take 5 tablets (100 mg total) by mouth as needed (for sexual activity). 50 tablet 3   No current facility-administered medications for this visit.     On ROS today: 10 system ROS is negative unless otherwise noted in HPI   Physical Examination   Vitals:  08/02/17 1511  BP: 133/85  Pulse: 95  Resp: 18  Temp: (!) 96.8 F (36 C)  TempSrc: Oral  SpO2: 96%  Weight: 245 lb (111.1 kg)  Height: 5' 10"  (1.778 m)   Body mass index is 35.15 kg/m.  General Alert, O x 3, WD, NAD  Pulmonary Sym exp, good B air movt,  Cardiac RRR, Nl S1, S2,   Gastro- intestinal soft, non-distended, non-tender to palpation,   Musculo- skeletal M/S 5/5 throughout  , Extremities without ischemic changes  , Pitting edema present: Bilateral lower extremities left more so than right; stasis pigmentation noticed bilateral shins; small area of cracking skin left lower shin, No visible varicosities , No Lipodermatosclerosis present  Neurologic Pain and light touch intact in extremities , Motor exam as listed above    Non-Invasive Vascular Imaging  Venous duplex  negative for acute DVT   Medical Decision Making   Edward [name] is a 77 y.o. male who presents to go over venous duplex   Venous duplex negative for DVT of left lower extremity  Recommended compression and elevation however patient is unwilling to try compression based on prior experience  I also offered venous reflux study to see if a small saphenous vein may be contributing to his edema however patient states that he was only interested to see if he had a DVT, otherwise his symptoms are tolerable  He will follow-up as scheduled for graft surveillance next year  Dagoberto Ligas PA-C [facility] Office: [phone]

## 2017-08-03 LAB — BASIC METABOLIC PANEL
BUN/Creatinine Ratio: 12 (ref 10–24)
BUN: 25 mg/dL (ref 8–27)
CO2: 22 mmol/L (ref 20–29)
Calcium: 9.1 mg/dL (ref 8.6–10.2)
Chloride: 99 mmol/L (ref 96–106)
Creatinine, Ser: 2.15 mg/dL — ABNORMAL HIGH (ref 0.76–1.27)
GFR calc Af Amer: 34 mL/min/{1.73_m2} — ABNORMAL LOW (ref >59)
GFR calc non Af Amer: 29 mL/min/{1.73_m2} — ABNORMAL LOW (ref >59)
GLUCOSE: 146 mg/dL — AB (ref 65–99)
Potassium: 4.4 mmol/L (ref 3.5–5.2)
SODIUM: 137 mmol/L (ref 134–144)

## 2017-08-03 LAB — LIPID PANEL
Chol/HDL Ratio: 2.7 ratio (ref 0.0–5.0)
Cholesterol, Total: 91 mg/dL — ABNORMAL LOW (ref 100–199)
HDL: 34 mg/dL — ABNORMAL LOW (ref >39)
LDL Calculated: 40 mg/dL (ref 0–99)
Triglycerides: 86 mg/dL (ref 0–149)
VLDL Cholesterol Cal: 17 mg/dL (ref 5–40)

## 2017-08-03 LAB — HEPATIC FUNCTION PANEL
ALBUMIN: 4.2 g/dL (ref 3.5–4.8)
ALT: 17 IU/L (ref 0–44)
AST: 28 IU/L (ref 0–40)
Alkaline Phosphatase: 132 IU/L — ABNORMAL HIGH (ref 39–117)
BILIRUBIN, DIRECT: 0.37 mg/dL (ref 0.00–0.40)
Bilirubin Total: 0.7 mg/dL (ref 0.0–1.2)
TOTAL PROTEIN: 7.3 g/dL (ref 6.0–8.5)

## 2017-09-22 ENCOUNTER — Other Ambulatory Visit: Payer: Self-pay | Admitting: Cardiovascular Disease

## 2017-09-24 NOTE — Telephone Encounter (Signed)
Please advise on refill request. Thanks, MI

## 2017-10-03 ENCOUNTER — Ambulatory Visit (INDEPENDENT_AMBULATORY_CARE_PROVIDER_SITE_OTHER): Payer: Federal, State, Local not specified - PPO | Admitting: *Deleted

## 2017-10-03 DIAGNOSIS — I498 Other specified cardiac arrhythmias: Secondary | ICD-10-CM

## 2017-10-03 DIAGNOSIS — Z950 Presence of cardiac pacemaker: Secondary | ICD-10-CM | POA: Diagnosis not present

## 2017-10-03 DIAGNOSIS — I495 Sick sinus syndrome: Secondary | ICD-10-CM

## 2017-10-03 LAB — CUP PACEART INCLINIC DEVICE CHECK
Battery Impedance: 949 Ohm
Battery Voltage: 2.77 V
Brady Statistic AP VP Percent: 99 %
Brady Statistic AP VS Percent: 1 %
Implantable Lead Implant Date: 20130605
Implantable Lead Model: 1948
Implantable Lead Model: 1948
Implantable Pulse Generator Implant Date: 20130605
Lead Channel Impedance Value: 428 Ohm
Lead Channel Impedance Value: 483 Ohm
Lead Channel Pacing Threshold Amplitude: 0.5 V
Lead Channel Pacing Threshold Pulse Width: 0.4 ms
Lead Channel Sensing Intrinsic Amplitude: 11.2 mV
MDC IDC LEAD IMPLANT DT: 20141022
MDC IDC LEAD LOCATION: 753859
MDC IDC LEAD LOCATION: 753860
MDC IDC MSMT BATTERY REMAINING LONGEVITY: 49 mo
MDC IDC MSMT LEADCHNL RV PACING THRESHOLD AMPLITUDE: 0.75 V
MDC IDC MSMT LEADCHNL RV PACING THRESHOLD PULSEWIDTH: 0.4 ms
MDC IDC SESS DTM: 20190919104303
MDC IDC SET LEADCHNL RA PACING AMPLITUDE: 2 V
MDC IDC SET LEADCHNL RV PACING AMPLITUDE: 2.5 V
MDC IDC SET LEADCHNL RV PACING PULSEWIDTH: 0.4 ms
MDC IDC SET LEADCHNL RV SENSING SENSITIVITY: 2.8 mV
MDC IDC STAT BRADY AS VP PERCENT: 0 %
MDC IDC STAT BRADY AS VS PERCENT: 0 %

## 2017-10-03 NOTE — Progress Notes (Signed)
Pacemaker check in clinic. Normal device function. Thresholds, sensing, impedances consistent with previous measurements. Device programmed to maximize longevity. No mode switches. 1 high ventricular rate noted--markers suggest 12bts NSVT. Reprogrammed EGM storage to summed EGMs. Device programmed at appropriate safety margins; RA/RV lead monitor reprogrammed to adaptive. Histogram distribution appropriate for patient activity level. Estimated longevity 4 years. Patient declines Carelink monitoring. Patient education completed. Patient requested to change EP. Advised I will make SK aware and call patient back with recommendations.

## 2017-10-09 ENCOUNTER — Telehealth: Payer: Self-pay | Admitting: *Deleted

## 2017-10-09 NOTE — Telephone Encounter (Signed)
LMOM requesting call back to the [facility], gave direct number.  At DC appointment on 9/19, patient requested to change EP. Dr. Caryl Comes approved change, no preference on another provider. Will advise patient that recall for 80yrf/u updated to list other EPs so that he can choose provider when he calls to schedule.

## 2017-10-09 NOTE — Telephone Encounter (Signed)
Spoke w/ pt and made him aware of recommendations. Pt verbalized understanding.

## 2017-10-14 ENCOUNTER — Other Ambulatory Visit: Payer: Self-pay | Admitting: Cardiovascular Disease

## 2017-10-16 ENCOUNTER — Telehealth: Payer: Self-pay

## 2017-10-16 NOTE — Telephone Encounter (Signed)
   [facility] Pre-operative Risk Assessment    Request for surgical clearance:  1. What type of surgery is being performed?  Cataract extraction w/Intraocular Lens Implantation of the Right/Left Eye   2. When is this surgery scheduled?  12/02/17 and 12/23/17   3. What type of clearance is required (medical clearance vs. Pharmacy clearance to hold med vs. Both)?  Medical  4. Are there any medications that need to be held prior to surgery and how long?   5. Practice name and name of physician performing surgery?  [facility]   6. What is your office phone number [phone]    7.   What is your office fax number [phone]  8.   Anesthesia type (None, local, MAC, general) ?     Legrand Como  Jeff Mccallum 10/16/2017, 10:22 AM  _________________________________________________________________   (provider comments below)

## 2017-10-16 NOTE — Telephone Encounter (Signed)
No testing needed prior to cataract surgery which is a low risk procedure.  No request made for holding Eliquis.  But, I will route to CVRR for recommendations in case holding anticoagulation is needed. [name], PA-C    10/16/2017 5:48 PM

## 2017-10-17 NOTE — Telephone Encounter (Signed)
Generally recommend against holding anticoagulation for cataract procedures.

## 2017-11-11 ENCOUNTER — Other Ambulatory Visit: Payer: Self-pay | Admitting: Cardiovascular Disease

## 2017-11-14 ENCOUNTER — Other Ambulatory Visit: Payer: Self-pay | Admitting: Cardiovascular Disease

## 2017-12-13 ENCOUNTER — Other Ambulatory Visit: Payer: Self-pay | Admitting: Physician Assistant

## 2017-12-13 DIAGNOSIS — I10 Essential (primary) hypertension: Secondary | ICD-10-CM

## 2018-01-30 ENCOUNTER — Ambulatory Visit: Payer: Federal, State, Local not specified - PPO | Admitting: Cardiovascular Disease

## 2018-02-13 ENCOUNTER — Ambulatory Visit (INDEPENDENT_AMBULATORY_CARE_PROVIDER_SITE_OTHER): Payer: Federal, State, Local not specified - PPO | Admitting: Cardiovascular Disease

## 2018-02-13 ENCOUNTER — Encounter: Payer: Self-pay | Admitting: Cardiovascular Disease

## 2018-02-13 VITALS — BP 126/76 | HR 96 | Ht 70.0 in | Wt 260.8 lb

## 2018-02-13 DIAGNOSIS — R0609 Other forms of dyspnea: Secondary | ICD-10-CM

## 2018-02-13 NOTE — Patient Instructions (Signed)
Medication Instructions:  Your physician recommends that you continue on your current medications as directed. Please refer to the Current Medication list given to you today.  If you need a refill on your cardiac medications before your next appointment, please call your pharmacy.   Lab work: None Ordered    Testing/Procedures: Your physician has requested that you have an echocardiogram. Echocardiography is a painless test that uses sound waves to create images of your heart. It provides your doctor with information about the size and shape of your heart and how well your heart's chambers and valves are working. This procedure takes approximately one hour. There are no restrictions for this procedure.    Follow-Up: At [facility], you and your health needs are our priority.  As part of our continuing mission to provide you with exceptional heart care, we have created designated Provider Care Teams.  These Care Teams include your primary Cardiologist (physician) and Advanced Practice Providers (APPs -  Physician Assistants and Nurse Practitioners) who all work together to provide you with the care you need, when you need it. You will need a follow up appointment in:  6 months.  Please call our office 2 months in advance to schedule this appointment.  You may see Mertie Moores, MD or one of the following Advanced Practice Providers on your designated Care Team: [name], PA-C [place], [place] . Daune Perch, NP

## 2018-02-13 NOTE — Progress Notes (Signed)
CARDIOLOGY OFFICE NOTE  Date:  02/15/2016    Edward [name] Date of Birth: [birth year] Medical Record #[id number]  PCP:  Molli Barrows, FNP    Cardiologist:         Chief Complaint  Patient presents with  . Shortness of Breath        Previous notes.  Edward [name] is a 78 y.o. male who presents today for a follow up visit. Seen for Dr. Acie Fredrickson.   He has a history of afib s/p MAZE w/ CABG 05/2011 (LIMA-LAD, SVG-D1, SVG-D2, SVG-CFX, SVG-PDA-PL), DM, HTN, HLD, PAD s/p R-CEA &L fem-pop, TIA, & anemia. He has PPM in place - followed by Dr. Caryl Comes.    Seen about a week ago as a work in by [employer], [employer] for shortness of breath and wheezing. There was felt to be some component of volume overload - his diuretics were increased for a few days. He was to come back to [place] for further follow up.   Comes in today. Here with his wife. He is quite abrasive. Very hard to get any history out of him. Will not look at me. Hard to say if he is better. Weight is down. Seems more bothered by cough. Has been to the [facility] noted. Given inhaler. Cough not productive. Has chest discomfort - sounds like it is from coughing but again - he really does not wish to give me any history.   Spoke with Dr. Acie Fredrickson here in the office - apparently this patient is not to see male providers due to past behavior.   [name]. 31, 2018  I was asked to see Edward [name] today on an urgent basis.  He was seen by Cecille Rubin.    She asked me to see him because he was being belligerent with her .  Has lost 17 lbs with lasix 40 BID . Still complains of cough.   CT of chest in March 2017 showed small pleural effusion .  Mild COPD,  Pulmonary art. Enlargement .  Coughing up brown sputum.  Advised him to see pulmonary  - he wants to ask the [facility] to send him to the [place] pulmonologist.   May 03, 2016:  Edward [name] is seen today for follow up of his worsening dyspnea.  Feeling much better . No CP ,     Oct. 1, 2018:   Seen for DOE ,  Still has some DOE ,  Also has some lightheadness right after he stands up .   January 03, 2017:  Edward [name] is seen today for follow-up visit. Is a bit more short of breath with the cold Denies any angina. Has asked for a prescription for sildenafil.  August 02, 2017:  Edward [name] is seen today for follow-up of his congestive heart failure and coronary artery disease. He has a history of afib s/p MAZE w/ CABG 05/2011 (LIMA-LAD, SVG-D1, SVG-D2, SVG-CFX, SVG-PDA-PL), DM, HTN, HLD, PAD s/p R-CEA &L fem-pop, TIA, & anemia. He has PPM in place - followed by Dr. Caryl Comes.   hes been to several doctor visits today  Wife broke her elbow 3 weeks ago   Overall not doing as well as he would like  Has lots of leg weakness and fatigue  Follows up with nephrology also  Wt. Today is 245  February 13, 2018: Edward [name] is seen today for a follow-up visit.  His weight today is 260 pounds which is up 15 pounds from 6 months ago.    Weighed  with his jacket and pockets full .  Weight is up 25 pounds from a year ago.  Has had lots of shortness of breath and DOE Was found to have low testosterone ,  Was given some testosterone cream.    Also was given the option of injections or tablets.  Has not started this yet .    Previous echocardiogram from January, 2018 reveals normal left ventricular systolic function.  We are not able to assess diastolic function because of his atrial fibrillation.         Past Medical History:  Diagnosis Date  . Anemia    ?  . Carotid artery occlusion   . Chronic diastolic CHF (congestive heart failure) ([place]) 04/23/2012  . Claudication ([place])   . Coronary artery disease    s/p CABG 2013  . Diabetic neuropathy (HCC)    mostly feet/ legs  . Dyslipidemia   . Exertional shortness of breath   . GERD (gastroesophageal reflux disease)   . GI bleed    while on triple anticoag therapy  . HTN (hypertension)   . Hyperlipidemia   .  Neuromuscular disorder (HCC)    neuropathy  . Pacemaker   . PAF (paroxysmal atrial fibrillation) (HCC)    s/p MAZE at time of CABG  . PVD (peripheral vascular disease) ([place])    R CEA 2011, prior PTA, left fem-pop 2014  . Stroke ([place])    vs TIA  . Type II diabetes mellitus ([place])          Past Surgical History:  Procedure Laterality Date  . ABDOMINAL AORTAGRAM N/A 01/07/2012   Procedure: ABDOMINAL Maxcine Ham;  Surgeon: Angelia Mould, MD;  Location: [facility] CATH LAB;  Service: Cardiovascular;  Laterality: N/A;  . BALLOON DILATION N/A 02/17/2013   Procedure: BALLOON DILATION;  Surgeon: Jerene Bears, MD;  Location: [facility] ENDOSCOPY;  Service: Gastroenterology;  Laterality: N/A;  . CAROTID ENDARTERECTOMY Right 03/2009  . CHOLECYSTECTOMY    . CIRCUMCISION    . COLONOSCOPY WITH PROPOFOL N/A 02/17/2013   Procedure: COLONOSCOPY WITH PROPOFOL;  Surgeon: Jerene Bears, MD;  [facility]: [facility]OS[facility];  Service: Gastroenterology;  Laterality: N/A;  . CORONARY ARTERY BYPASS GRAFT  06/14/2011   Procedure: CORONARY ARTERY BYPASS GRAFTING (CABG);  Surgeon: Gaye Pollack, MD;  Location: [facility];  Service: Open Heart Surgery;  Laterality: N/A;  Coronary Artery Bypass Graft on pump times six;  utilizing internal mammary artery and right greater saphenous vein harvested endoscopically.x 5 vessels  . ELECTROPHYSIOLOGY STUDY N/A 11/05/2012   Procedure: ELECTROPHYSIOLOGY STUDY;  Surgeon: Deboraha Sprang, MD;  Location: [facility] CATH LAB;  Service: Cardiovascular;  Laterality: N/A;  . ESOPHAGOGASTRODUODENOSCOPY (EGD) WITH PROPOFOL N/A 02/17/2013   Procedure: ESOPHAGOGASTRODUODENOSCOPY (EGD) WITH PROPOFOL;  Surgeon: Jerene Bears, MD;  [facility];  Service: Gastroenterology;  Laterality: N/A;  . FEMORAL-POPLITEAL BYPASS GRAFT Left 03/06/2012   Procedure: BYPASS GRAFT FEMORAL-POPLITEAL ARTERY;  Surgeon: Angelia Mould, MD;  Location: [place];  Service: Vascular;  Laterality: Left;  Left Femoral -  Below Knee Popliteal Bypass Graft with Vein and Intraoperative Arteriogram.  . INSERT / REPLACE / REMOVE PACEMAKER  2013   /notes 11/05/2012-[employer]  . JOINT REPLACEMENT Left    left TKA  . KNEE ARTHROSCOPY Left    "I had 3" (11/05/2012)  . LEAD REVISION  11/05/2012   pacemaker/notes 11/05/2012  . LEAD REVISION N/A 11/05/2012   Procedure: LEAD REVISION;  Surgeon: Deboraha Sprang, MD;  Location: [facility] CATH LAB;  Service: Cardiovascular;  Laterality: N/A;  . MAZE  06/14/2011   Procedure: MAZE;  Surgeon: Gaye Pollack, MD;  Location: [place];  Service: Open Heart Surgery;  Laterality: N/A;  Ligate left atrial appendage  . PERMANENT PACEMAKER INSERTION N/A 06/20/2011   Procedure: PERMANENT PACEMAKER INSERTION;  Surgeon: Deboraha Sprang, MD;  Location[facility] C[facility] LAB;  Service: Cardiovascular;  Laterality: N/A;  . TONSILLECTOMY AND ADENOIDECTOMY    . TOTAL KNEE ARTHROPLASTY Left      Medications:       Current Outpatient Prescriptions  Medication Sig Dispense Refill  . acetaminophen (TYLENOL) 500 MG tablet Take 500 mg by mouth every 6 (six) hours as needed for mild pain or moderate pain.     Marland Kitchen amLODipine (NORVASC) 5 MG tablet Take 1 tablet (5 mg total) by mouth daily. 90 tablet 3  . apixaban (ELIQUIS) 5 MG TABS tablet Take 1 tablet (5 mg total) by mouth 2 (two) times daily. 180 tablet 3  . benzonatate (TESSALON) 100 MG capsule Take 100 mg by mouth 4 (four) times daily as needed for cough.    . carvedilol (COREG) 25 MG tablet TAKE 1 TABLET (25 MG TOTAL) BY MOUTH 2 (TWO) TIMES DAILY. 180 tablet 3  . diphenhydramine-acetaminophen (TYLENOL PM) 25-500 MG TABS Take 1 tablet by mouth at bedtime as needed (sleep).     . famotidine (PEPCID) 20 MG tablet Take 20 mg by mouth daily.    . furosemide (LASIX) 40 MG tablet TAKE 2 TABLETS BY MOUTH EVERY DAY 180 tablet 2  . glipiZIDE (GLUCOTROL) 5 MG tablet Take 5 mg by mouth 3 (three) times daily.     Marland Kitchen guaiFENesin (ROBITUSSIN) 100  MG/5ML SOLN Take 5 mLs by mouth every 4 (four) hours as needed for cough or to loosen phlegm.    . Iron Combinations (IRON COMPLEX PO) Take 2 capsules by mouth daily.    Marland Kitchen KLOR-CON M20 20 MEQ tablet TAKE 1 TABLET BY MOUTH EVERY DAY 90 tablet 3  . L-Methylfolate-Algae-B12-B6 (METANX) 3-90.314-2-35 MG CAPS TAKE ONE CAPSULE BY MOUTH EVERY DAY 15 capsule 0  . liraglutide (VICTOZA) 18 MG/3ML SOPN Inject 18 mg into the skin daily.    Marland Kitchen loratadine (CLARITIN) 10 MG tablet Take 10 mg by mouth daily.    Marland Kitchen losartan (COZAAR) 100 MG tablet TAKE 1 TABLET BY MOUTH EVERY DAY 90 tablet 3  . Magnesium Oxide 420 MG TABS Take 420 mg by mouth daily.    . metFORMIN (GLUCOPHAGE) 1000 MG tablet Take 1,000 mg by mouth 2 (two) times daily with a meal.    . Multiple Vitamin (MULTIVITAMIN WITH MINERALS) TABS Take 1 tablet by mouth daily.    Marland Kitchen NOVOTWIST 32G X 5 MM MISC     . omeprazole (PRILOSEC) 40 MG capsule TAKE 1 CAPSULE 30 TO 60 MINUTES BEFORE YOUR FIRST AND LAST MEALS OF THE DAY 60 capsule 2  . ONETOUCH DELICA LANCETS FINE MISC 1 each by other route daily. 1/day 250.62 100 each 3  . ONETOUCH VERIO test strip     . pravastatin (PRAVACHOL) 80 MG tablet TAKE 1 TABLET (80 MG TOTAL) BY MOUTH DAILY. 90 tablet 3  . triamcinolone cream (KENALOG) 0.1 % Apply 1 application topically as needed (rash).      No current facility-administered medications for this visit.     Allergies:      Allergies  Allergen Reactions  . Lipitor [Atorvastatin Calcium] Other (See Comments)    Reaction unknown  Social History: The patient  reports that he quit smoking about 37 years ago. His smoking use included Cigarettes. He has a 96.00 pack-year smoking history. He has never used smokeless tobacco. He reports that he drinks about 1.2 oz of alcohol per week . He reports that he does not use drugs.   Family History: The patient's family history includes Coronary artery disease in his father and mother; Deep  vein thrombosis in his mother; Heart attack in his mother; Heart disease in his brother, father, mother, and sister; Hyperlipidemia in his mother and sister; Hypertension in his father, mother, and sister.   Review of Systems: Please see the history of present illness.   Otherwise, the review of systems is positive for none.   All other systems are reviewed and negative.   Physical Exam: Blood pressure 126/76, pulse 96, height 5' 10"  (1.778 m), weight 260 lb 12.8 oz (118.3 kg), SpO2 95 %.  GEN:  Well nourished, well developed in no acute distress HEENT: Normal NECK: No JVD; No carotid bruits LYMPHATICS: No lymphadenopathy CARDIAC: RRR  RESPIRATORY:  Clear to auscultation without rales, wheezing or rhonchi  ABDOMEN: obese ,  MUSCULOSKELETAL:  No edema; No deformity  SKIN: Warm and dry NEUROLOGIC:  Alert and oriented x 3   LABORATORY DATA:  EKG:     Recent Labs       Lab Results  Component Value Date   WBC 9.5 12/07/2015   HGB 12.6 (L) 12/07/2015   HCT 39.3 12/07/2015   PLT 214 12/07/2015   GLUCOSE 182 (H) 02/06/2016   CHOL 101 12/07/2015   TRIG 142 12/07/2015   HDL 31 (L) 12/07/2015   LDLCALC 42 12/07/2015   ALT 17 12/07/2015   AST 25 12/07/2015   NA 136 02/06/2016   K 4.8 02/06/2016   CL 100 02/06/2016   CREATININE 1.76 (H) 02/06/2016   BUN 20 02/06/2016   CO2 20 02/06/2016   TSH 3.28 12/07/2015   PSA 0.59 09/20/2012   INR 0.94 02/29/2012   HGBA1C 6.9 (H) 09/22/2013   MICROALBUR 56.2 (H) 07/08/2013      BNP (last 3 results)  Recent Labs (within last 365 days)   Recent Labs  02/06/16 1124  BNP 120.2*      ProBNP (last 3 results) Recent Labs (within last 365 days)  No results for input(s): PROBNP in the last 8760 hours.     Other Studies Reviewed Today:  ECHO: 01/26/2016 - Left ventricle: The cavity size was normal. Wall thickness was increased in a pattern of moderate LVH. There was focal  basal hypertrophy. Systolic function was normal. The estimated ejection fraction was in the range of 50% to 55%. - Mitral valve: Moderately calcified annulus. - Left atrium: The atrium was mildly dilated. - Right atrium: The atrium was moderately to severely dilated.  MYOVIEW: 02/25/2013 Overall Impression: This is a low risk scan with no significant change since January, 2014. There is a small/moderate area of scar at the apex. This has been noted in the past. There is no ischemia. LV Ejection Fraction: 53%. LV Wall Motion: Mild hypokinesis at the apex.  Assessment/Plan:  1. Dyspnea on exertion:     Has gradually worsened  Has significnat DOE .    2. CAD: no CP  , he wonders whether he has coronary artery disease.  He has significant CKD so we would like not to cath unless is absolutely necessary. We have performed Myoview studies in the past which have not revealed  the significant narrowings.  We will get an echocardiogram.  If his ejection fraction is fallen, will consider heart catheterization versus Myoview at that point.   3. CKD stage III:       5. PAF .  In nsr today   6.  Erectile dysfunction:    Current medicines are reviewed with the patient today.  The patient does not have concerns regarding medicines other than what has been noted above.  The following changes have been made:  See above.  Labs/ tests ordered today include:   No orders of the defined types were placed in this encounter.    Will see him in 6 months    Mertie Moores, MD  02/13/2018 11:28 AM    [address],  [address], [place]  [address] Pager [phone] Phone: [phone]; Fax: [phone]

## 2018-02-21 ENCOUNTER — Ambulatory Visit (HOSPITAL_COMMUNITY): Payer: Federal, State, Local not specified - PPO | Attending: Cardiovascular Disease

## 2018-02-21 DIAGNOSIS — R0609 Other forms of dyspnea: Secondary | ICD-10-CM | POA: Diagnosis not present

## 2018-02-25 ENCOUNTER — Telehealth: Payer: Self-pay | Admitting: Cardiovascular Disease

## 2018-02-25 NOTE — Telephone Encounter (Signed)
Results reviewed with patient via MyChart and all patient's questions answered to his satisfaction

## 2018-02-25 NOTE — Telephone Encounter (Signed)
New Message   PT is calling back for Echo results

## 2018-02-27 ENCOUNTER — Telehealth: Payer: Self-pay | Admitting: Internal Medicine

## 2018-02-27 NOTE — Telephone Encounter (Signed)
Spoke with patient. He reports that he has been experiencing worsened exertional ShOB for ~6 months. He walks to the mailbox and back and has to rest, then has to rest on each of the three steps into the house. He has been seeing Dr. Acie Fredrickson for this issue. His endocrinologist suggested it might be related to his PPM. Patient does not transmit remotely per his preference. He is establishing care with Dr. Rayann Heman on 04/09/18. Offered to move appointment up to 03/19/18 at 10:30am. Patient is agreeable to this plan.

## 2018-02-27 NOTE — Telephone Encounter (Signed)
Pt called to let us know he has been SOB and fatigues. Endocronilogist did thyroid test should low testosterone and he will start injections. Wanted to let us know to see if poss pacer related.Pls advise [phone]

## 2018-03-17 ENCOUNTER — Other Ambulatory Visit: Payer: Self-pay | Admitting: Cardiovascular Disease

## 2018-03-17 MED ORDER — FUROSEMIDE 40 MG PO TABS: ORAL_TABLET | ORAL | 3 refills | Status: DC

## 2018-03-18 ENCOUNTER — Encounter: Payer: Self-pay | Admitting: Internal Medicine

## 2018-03-18 ENCOUNTER — Other Ambulatory Visit: Payer: Self-pay

## 2018-03-18 MED ORDER — FUROSEMIDE 40 MG PO TABS: ORAL_TABLET | ORAL | 2 refills | Status: DC

## 2018-03-19 ENCOUNTER — Encounter: Payer: Self-pay | Admitting: Internal Medicine

## 2018-03-19 ENCOUNTER — Ambulatory Visit: Payer: Federal, State, Local not specified - PPO | Admitting: Internal Medicine

## 2018-03-19 VITALS — BP 120/76 | HR 95 | Ht 70.0 in | Wt 261.2 lb

## 2018-03-19 DIAGNOSIS — I2510 Atherosclerotic heart disease of native coronary artery without angina pectoris: Secondary | ICD-10-CM

## 2018-03-19 DIAGNOSIS — I480 Paroxysmal atrial fibrillation: Secondary | ICD-10-CM

## 2018-03-19 DIAGNOSIS — I10 Essential (primary) hypertension: Secondary | ICD-10-CM

## 2018-03-19 DIAGNOSIS — Z950 Presence of cardiac pacemaker: Secondary | ICD-10-CM | POA: Diagnosis not present

## 2018-03-19 DIAGNOSIS — I495 Sick sinus syndrome: Secondary | ICD-10-CM

## 2018-03-19 LAB — CUP PACEART INCLINIC DEVICE CHECK
Battery Impedance: 1137 Ohm
Battery Remaining Longevity: 44 mo
Brady Statistic RV Percent Paced: 96 %
Date Time Interrogation Session: 20200304160648
Implantable Lead Implant Date: 20130605
Implantable Lead Location: 753860
Implantable Lead Model: 1948
Implantable Lead Model: 1948
Implantable Pulse Generator Implant Date: 20130605
Lead Channel Pacing Threshold Amplitude: 0.75 V
Lead Channel Pacing Threshold Pulse Width: 0.4 ms
MDC IDC LEAD IMPLANT DT: 20141022
MDC IDC LEAD LOCATION: 753859
MDC IDC MSMT BATTERY VOLTAGE: 2.76 V
MDC IDC MSMT LEADCHNL RA IMPEDANCE VALUE: 406 Ohm
MDC IDC MSMT LEADCHNL RV IMPEDANCE VALUE: 476 Ohm
MDC IDC MSMT LEADCHNL RV SENSING INTR AMPL: 5.6 mV
MDC IDC SET LEADCHNL RV PACING AMPLITUDE: 2.5 V
MDC IDC SET LEADCHNL RV PACING PULSEWIDTH: 0.4 ms
MDC IDC SET LEADCHNL RV SENSING SENSITIVITY: 2.8 mV

## 2018-03-19 NOTE — Progress Notes (Signed)
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  Since last being seen by Dr Caryl Comes, he reports clinical decline. He has SOB with mild to moderate activity.  + fatigue.  Today, he denies symptoms of palpitations, chest pain,  lower extremity edema, dizziness, presyncope, or syncope.  The patient is otherwise without complaint today.   Past Medical History:  Diagnosis Date  . Anemia    ?  . Carotid artery occlusion   . Chronic diastolic CHF (congestive heart failure) ([place]) 04/23/2012  . CKD (chronic kidney disease), stage III ([place])   . Claudication ([place])   . Coronary artery disease    s/p CABG 2013  . Diabetes ([place])   . Diabetic neuropathy (HCC)    mostly feet/ legs  . Diverticulosis   . Dyslipidemia   . Exertional shortness of breath   . GERD (gastroesophageal reflux disease)   . GI bleed    while on triple anticoag therapy  . HTN (hypertension)   . Hyperlipidemia   . Neuromuscular disorder (HCC)    neuropathy  . Pacemaker   . PAF (paroxysmal atrial fibrillation) (HCC)    s/p MAZE at time of CABG  . PVD (peripheral vascular disease) ([place])    R CEA 2011, prior PTA, left fem-pop 2014  . Stroke ([place])    vs TIA  . Tubular adenoma of colon   . Type II diabetes mellitus ()    Past Surgical History:  Procedure Laterality Date  . ABDOMINAL AORTAGRAM N/A 01/07/2012   Procedure: ABDOMINAL Maxcine Ham;  Surgeon: Angelia Mould, MD;  Location: [facility] CATH LAB;  Service: Cardiovascular;  Laterality: N/A;  . BALLOON DILATION N/A 02/17/2013   Procedure: BALLOON DILATION;  Surgeon: Jerene Bears, MD;  [facility] ENDOS[facility];  Service: Gastroenterology;  Laterality: N/A;  . CAROTID ENDARTERECTOMY Right 03/2009  . CHOLECYSTECTOMY    . CIRCUMCISION    . COLONOSCOPY WITH PROPOFOL N/A 02/17/2013   Procedure: COLONOSCOPY WITH PROPOFOL;  Surgeon: Jerene Bears, MD;   [facility];  Service: Gastroenterology;  Laterality: N/A;  . CORONARY ARTERY BYPASS GRAFT  06/14/2011   Procedure: CORONARY ARTERY BYPASS GRAFTING (CABG);  Surgeon: Gaye Pollack, MD;  Location: [facility];  Service: Open Heart Surgery;  Laterality: N/A;  Coronary Artery Bypass Graft on pump times six;  utilizing internal mammary artery and right greater saphenous vein harvested endoscopically.x 5 vessels  . ELECTROPHYSIOLOGY STUDY N/A 11/05/2012   Procedure: ELECTROPHYSIOLOGY STUDY;  Surgeon: Deboraha Sprang, MD;  Location: [facility] CATH LAB;  Service: Cardiovascular;  Laterality: N/A;  . ESOPHAGOGASTRODUODENOSCOPY (EGD) WITH PROPOFOL N/A 02/17/2013   Procedure: ESOPHAGOGASTRODUODENOSCOPY (EGD) WITH PROPOFOL;  Surgeon: Jerene Bears, MD;  [facility];  Service: Gastroenterology;  Laterality: N/A;  . FEMORAL-POPLITEAL BYPASS GRAFT Left 03/06/2012   Procedure: BYPASS GRAFT FEMORAL-POPLITEAL ARTERY;  Surgeon: Angelia Mould, MD;  Location: [place];  Service: Vascular;  Laterality: Left;  Left Femoral - Below Knee Popliteal Bypass Graft with Vein and Intraoperative Arteriogram.  . INSERT / REPLACE / REMOVE PACEMAKER  2013   /notes 11/05/2012-[employer]  . JOINT REPLACEMENT Left    left TKA  . KNEE ARTHROSCOPY Left    "I had 3" (11/05/2012)  . LEAD REVISION  11/05/2012   pacemaker/notes 11/05/2012  . LEAD REVISION N/A 11/05/2012   Procedure: LEAD REVISION;  Surgeon: Deboraha Sprang, MD;  Location: [facility] CATH LAB;  Service: Cardiovascular;  Laterality: N/A;  . MAZE  06/14/2011   Procedure: MAZE;  Surgeon: Bryan K Bartle, MD;  Location: [facility] OR;  Service: Open Heart Surgery;  Laterality: N/A;  Ligate left atrial appendage  . PERMANENT PACEMAKER INSERTION N/A 06/20/2011   Procedure: PERMANENT PACEMAKER INSERTION;  Surgeon: Steven C Klein, MD;  Location: [facility] CATH LAB;  Service: Cardiovascular;  Laterality: N/A;  . TONSILLECTOMY AND ADENOIDECTOMY    . TOTAL KNEE ARTHROPLASTY Left     ROS- all  systems are reviewed and negative except as per HPI above  Current Outpatient Medications  Medication Sig Dispense Refill  . albuterol (PROVENTIL HFA;VENTOLIN HFA) 108 (90 Base) MCG/ACT inhaler USE 2 PUFFS EVERY 4 HOURS AS NEEDED FOR WHEEZING 8.5 Inhaler 0  . amLODipine (NORVASC) 5 MG tablet TAKE 1 TABLET BY MOUTH EVERY DAY 90 tablet 1  . apixaban (ELIQUIS) 2.5 MG TABS tablet Take 1.25 mg by mouth 2 (two) times daily.     . blood glucose meter kit and supplies Dispense based on patient and insurance preference. Use up to four times daily as directed. (FOR ICD-10 E10.9, E11.9). 1 each 0  . carvedilol (COREG) 25 MG tablet TAKE 1 TABLET TWICE A DAY WITH A MEAL 180 tablet 2  . cholecalciferol (VITAMIN D) 1000 units tablet Take 1,000 Units by mouth daily.    . cyanocobalamin 100 MCG tablet Take 500 mcg by mouth daily.    . furosemide (LASIX) 40 MG tablet TAKE 2 TABLETS BY MOUTH EVERY MORNING AND TAKE 1 TABLET IN THE AFTERNOON 270 tablet 2  . gabapentin (NEURONTIN) 300 MG capsule Take 100 mg by mouth See admin instructions. Take 100 mg in the morning and 200 mg in the evening    . insulin regular (NOVOLIN R,HUMULIN R) 100 units/mL injection Inject 60 Units into the skin 2 (two) times daily before a meal.     . Iron Combinations (IRON COMPLEX PO) Take 2 capsules by mouth daily.    . KLOR-CON M20 20 MEQ tablet TAKE 1 TABLET BY MOUTH EVERY DAY 90 tablet 2  . loratadine (CLARITIN) 10 MG tablet Take 10 mg by mouth daily.    . losartan (COZAAR) 100 MG tablet TAKE 1 TABLET BY MOUTH EVERY DAY 90 tablet 2  . Magnesium Oxide 400 MG CAPS Take 840 mg by mouth every evening.     . Multiple Vitamin (MULTIVITAMIN WITH MINERALS) TABS Take 1 tablet by mouth daily.    . omeprazole (PRILOSEC) 20 MG capsule Take 20 mg by mouth 2 (two) times daily before a meal.    . pravastatin (PRAVACHOL) 80 MG tablet TAKE 1 TABLET (80 MG TOTAL) BY MOUTH DAILY. 90 tablet 2  . Semaglutide (OZEMPIC) 1 MG/DOSE SOPN Inject 1 mg into the  skin once a week.     No current facility-administered medications for this visit.     Physical Exam: Vitals:   03/19/18 1022  BP: 120/76  Pulse: 95  SpO2: 97%  Weight: 261 lb 3.2 oz (118.5 kg)  Height: 5' 10" (1.778 m)    GEN- The patient is overweight appearing, alert and oriented x 3 today.   Head- normocephalic, atraumatic Eyes-  Sclera clear, conjunctiva pink Ears- hearing intact Oropharynx- clear Lungs- Clear to ausculation bilaterally, normal work of breathing Chest- pacemaker pocket is well healed Heart- Regular rate and rhythm  GI- soft, NT, ND, + BS Extremities- no clubbing, cyanosis, or edema  Pacemaker interrogation- reviewed in detail today,    See PACEART report  ekg tracing ordered today is personally reviewed and shows V paced rhythm  Assessment and Plan:  1. Symptomatic sinus bradycardia  Atrial lead is not capturing.  Because of this, he is V pacing and is VA dissociated today. I suspect that this is the source of his symptoms.  Dr Kleins note 04/05/17 states "We undertook pacing for atrial lead revision. It turned out that there is significantly compartmentalized atrial activity and we finally were able to find a site we can capture in case the atrium; there was no significant interval improvement".  He has already had an atrial lead revision previously and currently has two atrial leads.  CXR from 02/12/17 is reviewed See Pace Art report I have programmed VVI today. I had a long discussion with the patient and his wife.  I did discuss the possibility of another atrial lead revision.  Ultimately, I think this would lead to improvement in his symptoms.  This would probably be best accomplished with removal of the prior two atrial leads via extraction.  Dr Klein's notes suggest that atrial lead placement was quite challenging previously.  This could be the result of prior MAZE. I offered referral to [facility].  The patient would prefer to follow-up with Dr Klein for  his opinion.  As Dr Klein knows the patient well, I suspect that this is the best option currently.  I will therefore arrange follow-up with Dr Klein in the next few weeks.  2. Persistent afib S/p prior MAZE Maintaining sinus rhythm.  He is on eliquis.  3. HTN Stable No change required today  4. CAD No ischemic symptoms  Of note, there is suggestion of difficulties with patient behavior in the office before.  This was not an issue for us today, largely because of the presence of the patient's wife.  I have encouraged her to attend every office visit going forward, largely to help the patient understand what is being communicated from providers and to assist with memory recollection which she feels is an issue for him.    Very complicated patient situation.  A high level of decision making was required for the visit today. He will follow-up with Dr Klein at the next available time.    MD, FACC 03/19/2018 10:36 AM    

## 2018-03-19 NOTE — Patient Instructions (Addendum)
Medication Instructions:  Your physician recommends that you continue on your current medications as directed. Please refer to the Current Medication list given to you today.  Labwork: None ordered.  Testing/Procedures: None ordered.  Follow-Up:  You have an appointment scheduled with Dr. Caryl Comes for March 25, 2018 at 3:45 pm.    Any Other Special Instructions Will Be Listed Below (If Applicable).  If you need a refill on your cardiac medications before your next appointment, please call your pharmacy.

## 2018-03-24 ENCOUNTER — Encounter: Payer: Federal, State, Local not specified - PPO | Admitting: Internal Medicine

## 2018-03-25 ENCOUNTER — Ambulatory Visit (INDEPENDENT_AMBULATORY_CARE_PROVIDER_SITE_OTHER): Payer: Federal, State, Local not specified - PPO | Admitting: Internal Medicine

## 2018-03-25 ENCOUNTER — Encounter: Payer: Self-pay | Admitting: Internal Medicine

## 2018-03-25 VITALS — BP 126/78 | HR 88 | Ht 70.0 in | Wt 258.8 lb

## 2018-03-25 DIAGNOSIS — I495 Sick sinus syndrome: Secondary | ICD-10-CM | POA: Diagnosis not present

## 2018-03-25 DIAGNOSIS — I480 Paroxysmal atrial fibrillation: Secondary | ICD-10-CM | POA: Diagnosis not present

## 2018-03-25 DIAGNOSIS — Z950 Presence of cardiac pacemaker: Secondary | ICD-10-CM

## 2018-03-25 DIAGNOSIS — I498 Other specified cardiac arrhythmias: Secondary | ICD-10-CM

## 2018-03-25 MED ORDER — LOSARTAN POTASSIUM 50 MG PO TABS: 50.0000 mg | ORAL_TABLET | Freq: Every day | ORAL | 3 refills | Status: DC

## 2018-03-25 NOTE — Progress Notes (Signed)
Patient Care Team: [facility] as PCP - General Nahser, Wonda Cheng, MD as PCP - Cardiology (Cardiology) Angelia Mould, MD as Attending Physician (Vascular Surgery) Gaye Pollack, MD as Attending Physician (Cardiothoracic Surgery) Deboraha Sprang, MD as Attending Physician (Cardiology) Rodney Langton, MD as Referring Physician (Internal Medicine)   HPI  Edward [name] is a 78 y.o. male Seen in followup for pacemaker implanted for sinus node dysfunction and continue rapid junctional rhythm. This emerged post bypass surgery with concomitant maze.    He has hx of CAD with prior CABG 2013; cath has been deferred because of renal insufficiency  We undertook pacing for atrial lead revision. It turned out that there is significantly compartmentalized atrial activity and we finally were able to find a site we can capture in case the atrium; there was no significant interval improvement.   That having been said, he comes in today having seen Dr. Rayann Heman earlier this week with complaints of 3 to 6 months of significant worsening in exercise tolerance.  Dr. Rayann Heman appreciated that the ECG showed ventricular pacing no longer with AV pacing.  There is significant volume overload.  There is close to 25 pound weight gain in the last year.  His abdominal distention, bendopnea, peripheral edema, and orthopnea.  Extreme dyspnea on exertion at less than 100 feet.  Diet is replete of sodium.  He has sworn off "for the last 3 days "  The concern is whether the loss of what AV synchrony we had and as noted above, clinically he had not appreciated much difference.  DATE TEST EF    /18 MYOVIEW  60 % Prior scar    2/20 Echo  55-60%           Date Cr K Hgb   1/18 1.76 4.8 12.6  1/19 2.19 4.1 12.8   He takes Eliquis 2.5 twice daily. He is not inclined to take full doses because of problems with GI bleeding.       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      Past Medical History:  Diagnosis Date  . Anemia    ?  . Carotid artery occlusion   . Chronic diastolic CHF (congestive heart failure) ([place]) 04/23/2012  . CKD (chronic kidney disease), stage III ([place])   . Claudication ([place])   . Coronary artery disease    s/p CABG 2013  . Diabetes ([place])   . Diabetic neuropathy (HCC)    mostly feet/ legs  . Diverticulosis   . Dyslipidemia   . Exertional shortness of breath   . GERD (gastroesophageal reflux disease)   . GI bleed    while on triple anticoag therapy  . HTN (hypertension)   . Hyperlipidemia   . Neuromuscular disorder (HCC)    neuropathy  . Pacemaker   . PAF (paroxysmal atrial fibrillation) (HCC)    s/p MAZE at time of CABG  . PVD (peripheral vascular disease) ([place])    R CEA 2011, prior PTA, left fem-pop 2014  . Stroke ([place])    vs TIA  . Tubular adenoma of colon   . Type II diabetes mellitus ([place])     Past Surgical History:  Procedure Laterality Date  . ABDOMINAL AORTAGRAM N/A 01/07/2012   Procedure: ABDOMINAL Maxcine Ham;  Surgeon: Angelia Mould, MD;  Location: [facility] CATH LAB;  Service: Cardiovascular;  Laterality: N/A;  . BALLOON DILATION N/A 02/17/2013  Procedure: BALLOON DILATION;  Surgeon: Jerene Bears, MD;  Location: [facility] ENDOSCOPY;  Service: Gastroenterology;  Laterality: N/A;  . CAROTID ENDARTERECTOMY Right 03/2009  . CHOLECYSTECTOMY    . CIRCUMCISION    . COLONOSCOPY WITH PROPOFOL N/A 02/17/2013   Procedure: COLONOSCOPY WITH PROPOFOL;  Surgeon: Jerene Bears, MD;  Location: [facility] ENDOSCOPY;  Service: Gastroenterology;  Laterality: N/A;  . CORONARY ARTERY BYPASS GRAFT  06/14/2011   Procedure: CORONARY ARTERY BYPASS GRAFTING (CABG);  Surgeon: Gaye Pollack, MD;  Location: [facility];  Service: Open Heart Surgery;  Laterality: N/A;  Coronary Artery Bypass Graft on pump times six;  utilizing internal mammary artery and right greater saphenous vein harvested endoscopically.x 5 vessels  . ELECTROPHYSIOLOGY STUDY  N/A 11/05/2012   Procedure: ELECTROPHYSIOLOGY STUDY;  Surgeon: Deboraha Sprang, MD;  Location: [facility] CATH LAB;  Service: Cardiovascular;  Laterality: N/A;  . ESOPHAGOGASTRODUODENOSCOPY (EGD) WITH PROPOFOL N/A 02/17/2013   Procedure: ESOPHAGOGASTRODUODENOSCOPY (EGD) WITH PROPOFOL;  Surgeon: Jerene Bears, MD;  Location: [facility] ENDOSCOPY;  Service: Gastroenterology;  Laterality: N/A;  . FEMORAL-POPLITEAL BYPASS GRAFT Left 03/06/2012   Procedure: BYPASS GRAFT FEMORAL-POPLITEAL ARTERY;  Surgeon: Angelia Mould, MD;  Location: [facility];  Service: Vascular;  Laterality: Left;  Left Femoral - Below Knee Popliteal Bypass Graft with Vein and Intraoperative Arteriogram.  . INSERT / REPLACE / REMOVE PACEMAKER  2013   /notes 11/05/2012-[employer]  . JOINT REPLACEMENT Left    left TKA  . KNEE ARTHROSCOPY Left    "I had 3" (11/05/2012)  . LEAD REVISION  11/05/2012   pacemaker/notes 11/05/2012  . LEAD REVISION N/A 11/05/2012   Procedure: LEAD REVISION;  Surgeon: Deboraha Sprang, MD;  Location: [facility] CATH LAB;  Service: Cardiovascular;  Laterality: N/A;  . MAZE  06/14/2011   Procedure: MAZE;  Surgeon: Gaye Pollack, MD;  Location: [facility];  Service: Open Heart Surgery;  Laterality: N/A;  Ligate left atrial appendage  . PERMANENT PACEMAKER INSERTION N/A 06/20/2011   Procedure: PERMANENT PACEMAKER INSERTION;  Surgeon: Deboraha Sprang, MD;  Location: [facility] CATH LAB;  Service: Cardiovascular;  Laterality: N/A;  . TONSILLECTOMY AND ADENOIDECTOMY    . TOTAL KNEE ARTHROPLASTY Left     Current Outpatient Medications  Medication Sig Dispense Refill  . acetaminophen (TYLENOL 8 HOUR ARTHRITIS PAIN) 650 MG CR tablet Take 650 mg by mouth every 8 (eight) hours as needed for pain.    Marland Kitchen albuterol (PROVENTIL HFA;VENTOLIN HFA) 108 (90 Base) MCG/ACT inhaler USE 2 PUFFS EVERY 4 HOURS AS NEEDED FOR WHEEZING 8.5 Inhaler 0  . amLODipine (NORVASC) 5 MG tablet TAKE 1 TABLET BY MOUTH EVERY DAY 90 tablet 1  . apixaban (ELIQUIS) 2.5 MG TABS tablet  Take 1.25 mg by mouth 2 (two) times daily.     . blood glucose meter kit and supplies Dispense based on patient and insurance preference. Use up to four times daily as directed. (FOR ICD-10 E10.9, E11.9). 1 each 0  . carvedilol (COREG) 25 MG tablet TAKE 1 TABLET TWICE A DAY WITH A MEAL 180 tablet 2  . cholecalciferol (VITAMIN D) 1000 units tablet Take 1,000 Units by mouth daily.    . cyanocobalamin 100 MCG tablet Take 500 mcg by mouth daily.    . furosemide (LASIX) 40 MG tablet TAKE 2 TABLETS BY MOUTH EVERY MORNING AND TAKE 1 TABLET IN THE AFTERNOON 270 tablet 2  . gabapentin (NEURONTIN) 300 MG capsule Take 100 mg by mouth See admin instructions. Take 100 mg in the morning and  200 mg in the evening    . insulin regular (NOVOLIN R,HUMULIN R) 100 units/mL injection Inject 60 Units into the skin 2 (two) times daily before a meal.     . Iron Combinations (IRON COMPLEX PO) Take 2 capsules by mouth daily.    Marland Kitchen KLOR-CON M20 20 MEQ tablet TAKE 1 TABLET BY MOUTH EVERY DAY 90 tablet 2  . loratadine (CLARITIN) 10 MG tablet Take 10 mg by mouth daily.    Marland Kitchen losartan (COZAAR) 100 MG tablet TAKE 1 TABLET BY MOUTH EVERY DAY 90 tablet 2  . Magnesium Oxide 400 MG CAPS Take 800 mg by mouth every evening.     . Multiple Vitamin (MULTIVITAMIN WITH MINERALS) TABS Take 1 tablet by mouth daily.    Marland Kitchen omeprazole (PRILOSEC) 20 MG capsule Take 20 mg by mouth 2 (two) times daily before a meal.    . pravastatin (PRAVACHOL) 80 MG tablet TAKE 1 TABLET (80 MG TOTAL) BY MOUTH DAILY. 90 tablet 2  . Semaglutide (OZEMPIC) 1 MG/DOSE SOPN Inject 1 mg into the skin once a week.    . testosterone cypionate (DEPOTESTOSTERONE CYPIONATE) 200 MG/ML injection Inject 200 mg into the muscle every 14 (fourteen) days.     No current facility-administered medications for this visit.     Allergies  Allergen Reactions  . Lipitor [Atorvastatin Calcium] Other (See Comments)    Reaction unknown    Review of Systems negative except from HPI and  PMH  Physical Exam BP 126/78   Pulse 88   Ht _0  (1.778 m)   Wt 258 lb 12.8 oz (117.4 kg)   SpO2 90%   BMI 37.13 kg/m  Well developed and nourished in no acute distress HENT normal Neck supple with JVP-10 cm positive HJR Clear Regular rate and rhythm, no murmurs or gallops Abd-soft with active BS No Clubbing cyanosis 3+ edema Skin-warm and dry A & Oriented  Grossly normal sensory and motor function    ECG demonstrates from 03/21/2018>> V pacing without evidence of atrial activity  Assessment and  Plan Sinus node dysfunction  Junctional rhythm  Pacemaker-[employer]tr[employer]  Atrial fibrillation  Ventricular Tachycardia nonsustained   Ischemic heart disease with prior bypass\  HFpEF acute/chronic  Renal insufficiency grade 3  On Anticoagulation;  No bleeding issues --he is aware of the reduced effectiveness of his low-dose apixaban  No interval VT  Congestive heart failure is at least a major consequence of his volume overload.  We reviewed extensively salt and water intake and will increase his diuretics from 80--120 daily alternating with 80 for the next week.  The goal is to lose 15 pounds.  If he is unable to diurese vigorously with 120, we will try with 60 and if that does not work we will add low-dose metolazone.  We will have to be attentive to his kidneys; last creatinine a year ago was 2.15.  He will let us know what was most recently with the [place].  Given his renal insufficiency, I have taken the liberty of reducing his losartan from 100--50  I have spoke with Dr. Lurena Nida.  He will review the echo from 1/18 and 2/20 looking for evidence of an A-wave to support the idea of the loss of atrial contractility as a cause or not of his heart failure  More than 50% of 40 min was spent in counseling related to the above

## 2018-03-25 NOTE — Patient Instructions (Addendum)
Medication Instructions:  Your physician has recommended you make the following change in your medication:   1. Decrease you Losartan to 37m, half tablet, once daily.  2. Take Lasix, 1272monce every other day. On alternating days, take 8072mDo this for 7 days. 3. Take Potassium, 7m67mnce every other day. On alternating days, take 20mE21mo this for the 7 days you increase your lasix. 4. When you have completed your diuresis, return back to your normal doses of lasix and potassium.  Labwork: Your physician recommends that you return for lab work in:   March 20th for a BMP. You may come into the office at your convenience between 8 am and 4:30 pm.  Testing/Procedures: None ordered.  Follow-Up: Your physician recommends that you schedule a follow-up appointment in:   3 months with Dr KleinCaryl Comes Other Special Instructions Will Be Listed Below (If Applicable).     If you need a refill on your cardiac medications before your next appointment, please call your pharmacy.

## 2018-03-28 ENCOUNTER — Encounter: Payer: Federal, State, Local not specified - PPO | Admitting: Internal Medicine

## 2018-04-04 ENCOUNTER — Other Ambulatory Visit: Payer: Self-pay

## 2018-04-04 ENCOUNTER — Other Ambulatory Visit: Payer: Federal, State, Local not specified - PPO | Admitting: *Deleted

## 2018-04-04 DIAGNOSIS — I480 Paroxysmal atrial fibrillation: Secondary | ICD-10-CM

## 2018-04-04 DIAGNOSIS — I498 Other specified cardiac arrhythmias: Secondary | ICD-10-CM

## 2018-04-04 DIAGNOSIS — I495 Sick sinus syndrome: Secondary | ICD-10-CM

## 2018-04-04 DIAGNOSIS — Z950 Presence of cardiac pacemaker: Secondary | ICD-10-CM

## 2018-04-04 LAB — BASIC METABOLIC PANEL
BUN/Creatinine Ratio: 14 (ref 10–24)
BUN: 35 mg/dL — ABNORMAL HIGH (ref 8–27)
CALCIUM: 8.9 mg/dL (ref 8.6–10.2)
CHLORIDE: 104 mmol/L (ref 96–106)
CO2: 19 mmol/L — ABNORMAL LOW (ref 20–29)
Creatinine, Ser: 2.55 mg/dL — ABNORMAL HIGH (ref 0.76–1.27)
GFR calc non Af Amer: 24 mL/min/{1.73_m2} — ABNORMAL LOW (ref >59)
GFR, EST AFRICAN AMERICAN: 27 mL/min/{1.73_m2} — AB (ref >59)
Glucose: 94 mg/dL (ref 65–99)
Potassium: 4.2 mmol/L (ref 3.5–5.2)
Sodium: 140 mmol/L (ref 134–144)

## 2018-04-08 ENCOUNTER — Telehealth: Payer: Self-pay | Admitting: Internal Medicine

## 2018-04-08 NOTE — Telephone Encounter (Signed)
I spoke with the patient's wife regarding his lab results (ok per DPR). The patient is currently resting after having had cataract surgery this morning.   I inquired if he had much diuresis with the increased dose in his lasix per Dr. Olin Pia recommendations from his office visit on 03/25/18.  Per Mrs. Rettig, the patient had no effects from the increase in his diuretics.  His weight is still 255-260 lbs.  His lower extremities are swollen and his abdomen is bloated.  Mrs. Londo is unsure if the patient has been seen by nephrology at the [facility] as most of his doctors are there.  I advised I will need to update Dr. Caryl Comes of the above information and we will be back in touch with the patient regarding any further recommendations.   Mrs. Blann is agreeable and asks that we call back tomorrow after the patient has recovered from his cataract surgery.   To Dr. Caryl Comes to review.

## 2018-04-08 NOTE — Telephone Encounter (Signed)
Notes recorded by Deboraha Sprang, MD on 04/08/2018 at 1:46 PM EDT H can you call him and see if he diuresed His Cr has gone up and the 2 nd Q is whether he has seen renal recently

## 2018-04-09 ENCOUNTER — Encounter: Payer: Federal, State, Local not specified - PPO | Admitting: Internal Medicine

## 2018-04-09 NOTE — Telephone Encounter (Signed)
Pt sees Dr Perley Jain @ [phone].

## 2018-04-09 NOTE — Telephone Encounter (Signed)
H do you think you might be able to track down a number for his nephrologist so we can try and approach his edema in the most effective and safest way   THX s

## 2018-04-21 ENCOUNTER — Other Ambulatory Visit: Payer: Self-pay | Admitting: Internal Medicine

## 2018-04-21 MED ORDER — VALSARTAN 80 MG PO TABS: 80.0000 mg | ORAL_TABLET | Freq: Every day | ORAL | 3 refills | Status: DC

## 2018-04-21 NOTE — Telephone Encounter (Signed)
Lets try valsartan 80  thks

## 2018-04-21 NOTE — Telephone Encounter (Signed)
[facility] pharmacy is requesting an alternative for Losartan potassium 50 mg tablet. Pharmacy stating that all losartan strengths are on backorder. Suggested alternatives are telmisartan 40 mg, valsartan 160 mg, irbesartan 150 mg, olmesartan medoxomil 20 mg and candesartan cilexetil 16 mg tablet. Please address

## 2018-05-03 ENCOUNTER — Emergency Department (HOSPITAL_COMMUNITY)
Admission: EM | Admit: 2018-05-03 | Discharge: 2018-05-03 | Disposition: A | Discharge status: Home / Self Care | Payer: Federal, State, Local not specified - PPO | Attending: Emergency Medicine | Admitting: Emergency Medicine

## 2018-05-03 ENCOUNTER — Other Ambulatory Visit: Payer: Self-pay

## 2018-05-03 ENCOUNTER — Encounter (HOSPITAL_COMMUNITY): Payer: Self-pay

## 2018-05-03 ENCOUNTER — Telehealth: Payer: Self-pay | Admitting: Cardiology

## 2018-05-03 DIAGNOSIS — Z79899 Other long term (current) drug therapy: Secondary | ICD-10-CM | POA: Diagnosis not present

## 2018-05-03 DIAGNOSIS — E162 Hypoglycemia, unspecified: Secondary | ICD-10-CM | POA: Diagnosis not present

## 2018-05-03 DIAGNOSIS — Z87891 Personal history of nicotine dependence: Secondary | ICD-10-CM | POA: Insufficient documentation

## 2018-05-03 DIAGNOSIS — N183 Chronic kidney disease, stage 3 (moderate): Secondary | ICD-10-CM | POA: Insufficient documentation

## 2018-05-03 DIAGNOSIS — I5032 Chronic diastolic (congestive) heart failure: Secondary | ICD-10-CM | POA: Diagnosis not present

## 2018-05-03 DIAGNOSIS — I130 Hypertensive heart and chronic kidney disease with heart failure and stage 1 through stage 4 chronic kidney disease, or unspecified chronic kidney disease: Secondary | ICD-10-CM | POA: Diagnosis not present

## 2018-05-03 DIAGNOSIS — R4182 Altered mental status, unspecified: Secondary | ICD-10-CM | POA: Diagnosis present

## 2018-05-03 LAB — CBC WITH DIFFERENTIAL/PLATELET
Abs Immature Granulocytes: 0.03 10*3/uL (ref 0.00–0.07)
Basophils Absolute: 0.1 10*3/uL (ref 0.0–0.1)
Basophils Relative: 1 %
Eosinophils Absolute: 0.4 10*3/uL (ref 0.0–0.5)
Eosinophils Relative: 4 %
HCT: 43.1 % (ref 39.0–52.0)
Hemoglobin: 13.2 g/dL (ref 13.0–17.0)
Immature Granulocytes: 0 %
Lymphocytes Relative: 8 %
Lymphs Abs: 0.7 10*3/uL (ref 0.7–4.0)
MCH: 26.3 pg (ref 26.0–34.0)
MCHC: 30.6 g/dL (ref 30.0–36.0)
MCV: 85.9 fL (ref 80.0–100.0)
Monocytes Absolute: 1.2 10*3/uL — ABNORMAL HIGH (ref 0.1–1.0)
Monocytes Relative: 13 %
Neutro Abs: 6.8 10*3/uL (ref 1.7–7.7)
Neutrophils Relative %: 74 %
Platelets: 173 10*3/uL (ref 150–400)
RBC: 5.02 MIL/uL (ref 4.22–5.81)
RDW: 17.6 % — ABNORMAL HIGH (ref 11.5–15.5)
WBC: 9.1 10*3/uL (ref 4.0–10.5)
nRBC: 0 % (ref 0.0–0.2)

## 2018-05-03 LAB — COMPREHENSIVE METABOLIC PANEL
ALT: 15 U/L (ref 0–44)
AST: 26 U/L (ref 15–41)
Albumin: 4 g/dL (ref 3.5–5.0)
Alkaline Phosphatase: 98 U/L (ref 38–126)
Anion gap: 12 (ref 5–15)
BUN: 25 mg/dL — ABNORMAL HIGH (ref 8–23)
CO2: 23 mmol/L (ref 22–32)
Calcium: 8.9 mg/dL (ref 8.9–10.3)
Chloride: 104 mmol/L (ref 98–111)
Creatinine, Ser: 2.34 mg/dL — ABNORMAL HIGH (ref 0.61–1.24)
GFR calc Af Amer: 30 mL/min — ABNORMAL LOW (ref >60)
GFR calc non Af Amer: 26 mL/min — ABNORMAL LOW (ref >60)
Glucose, Bld: 70 mg/dL (ref 70–99)
Potassium: 4.2 mmol/L (ref 3.5–5.1)
Sodium: 139 mmol/L (ref 135–145)
Total Bilirubin: 1.9 mg/dL — ABNORMAL HIGH (ref 0.3–1.2)
Total Protein: 7.5 g/dL (ref 6.5–8.1)

## 2018-05-03 LAB — URINALYSIS, COMPLETE (UACMP) WITH MICROSCOPIC
Bacteria, UA: NONE SEEN
Bilirubin Urine: NEGATIVE
Glucose, UA: NEGATIVE mg/dL
Hgb urine dipstick: NEGATIVE
Ketones, ur: NEGATIVE mg/dL
Leukocytes,Ua: NEGATIVE
Nitrite: NEGATIVE
Protein, ur: NEGATIVE mg/dL
Specific Gravity, Urine: 1.008 (ref 1.005–1.030)
pH: 6 (ref 5.0–8.0)

## 2018-05-03 LAB — CBG MONITORING, ED
Glucose-Capillary: 60 mg/dL — ABNORMAL LOW (ref 70–99)
Glucose-Capillary: 97 mg/dL (ref 70–99)

## 2018-05-03 MED ORDER — DEXTROSE 50 % IV SOLN
1.0000 | Freq: Once | INTRAVENOUS | Status: AC
  Administered 2018-05-03: 12:00:00 50 mL via INTRAVENOUS
  Filled 2018-05-03: qty 50

## 2018-05-03 NOTE — ED Notes (Signed)
Pt ambulated to bathroom to provide urine sample. Pt tolerated well.

## 2018-05-03 NOTE — ED Notes (Signed)
Patient verbalizes understanding of discharge instructions. Opportunity for questioning and answers were provided. Armband removed by staff, pt discharged from ED. Wheeled out to lobby

## 2018-05-03 NOTE — ED Triage Notes (Addendum)
EMS called to home for slurred speech and ams. CBG 63 and given 15 grams oral glucose and pt now resolved of symptoms buyt insistent that he go to bathroom. Pt to bathroom with tech. Pt did eat and take insulin this am.

## 2018-05-03 NOTE — ED Provider Notes (Signed)
[facility] EMERGENCY DEPARTMENT Provider Note   CSN: [id number] Arrival date & time: 05/03/18  1104    History   Chief Complaint Chief Complaint  Patient presents with  . Altered Mental Status    HPI Edward [name] is a 78 y.o. male.     The history is provided by the patient and the EMS personnel. No language interpreter was used.  Altered Mental Status     78 year old male with history of insulin-dependent diabetes, CHF, CKD, paroxysmal atrial fibrillation, has pacemaker currently on Eliquis brought here via EMS from home for evaluation of altered mental status.  Per EMS, patient's wife at called out to EMS because she was concerned of a stroke since patient was altered this a.m, which include slurred speech and confusion.  On initial exam, he was lethargic and noted to have a CBG of 63.  He was given 15 g of oral glucose.  Shortly after he became more responsive and symptoms resolved.  Currently patient able to be provide history.  Patient mention he normally takes his insulin 60 units in the morning and at night but admits that he did not check his blood sugar today prior to the administration of his insulin.  He did eat his breakfast.  He felt symptoms likely due to low blood sugar.  He denies any recent sickness or recent medication changes.  He denies having headache, fever, chills, cold symptoms, pain the chest, trouble breathing, abdominal pain, back pain, focal numbness or weakness or confusion at this time.  He denies any recent sickness.  He admits he does not normally check his blood sugar prior to administration of his medication.  He does take Ozempic insulin injection on Sunday, which is tomorrow.  He did not miss any meals.  No n/v/d. Nurse mentioned he was able to urinate and walk with a steady gait upon arrival.   Past Medical History:  Diagnosis Date  . Anemia    ?  . Carotid artery occlusion   . Chronic diastolic CHF (congestive heart failure)  ([place]) 04/23/2012  . CKD (chronic kidney disease), stage III ([place])   . Claudication ([place])   . Coronary artery disease    s/p CABG 2013  . Diabetes ([place])   . Diabetic neuropathy (HCC)    mostly feet/ legs  . Diverticulosis   . Dyslipidemia   . Exertional shortness of breath   . GERD (gastroesophageal reflux disease)   . GI bleed    while on triple anticoag therapy  . HTN (hypertension)   . Hyperlipidemia   . Neuromuscular disorder (HCC)    neuropathy  . Pacemaker   . PAF (paroxysmal atrial fibrillation) (HCC)    s/p MAZE at time of CABG  . PVD (peripheral vascular disease) ([place])    R CEA 2011, prior PTA, left fem-pop 2014  . Stroke ([place])    vs TIA  . Tubular adenoma of colon   . Type II diabetes mellitus [facility])     Patient Active Problem List   Diagnosis Date Noted  . Cough 02/15/2016  . Atelectasis/ subsegmental LLL ant basal segment  03/12/2015  . Morbid obesity ([place]) 03/12/2015  . Upper airway cough syndrome 03/11/2015  . PVD (peripheral vascular disease) ([place]) 08/26/2013  . Benign neoplasm of colon 02/17/2013  . 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 Anemia, iron deficiency 11/22/2012  . Dyspnea 11/21/2012  . Pacemaker lead malfunction 09/23/2012  . Aftercare following surgery of the circulatory system, [place] 09/10/2012  .  Chronic diastolic CHF (congestive heart failure) ([place]) 04/23/2012  . Bilateral lower extremity edema 04/14/2012  . PVD (peripheral vascular disease) with claudication ([place]) 03/26/2012  . Peripheral vascular disease, unspecified ([place]) 02/13/2012  . Pacemaker-Medtronic 06/21/2011  . Accelerated junctional rhythm 06/21/2011  . Coronary artery disease 06/21/2011  . S/P Maze operation for atrial fibrillation 06/21/2011  . Sick sinus syndrome ([place]) 06/19/2011  . HTN (hypertension) 05/31/2011  . LBBB (left bundle branch block) 11/15/2010  . Hyperlipidemia 05/30/2010  . Atherosclerosis of renal artery ([place]) 05/19/2010  . PAF (paroxysmal atrial fibrillation) ([place])  04/18/2010  . Dyspnea on exertion 04/18/2010  . Diabetes mellitus ([place]) 04/18/2010  . Atherosclerosis of native artery of extremity with intermittent claudication ([place]) 04/18/2010  . Anemia 04/18/2010  . PAIN IN SOFT TISSUES OF LIMB 03/31/2010  . CAROTID ARTERY DISEASE 03/30/2010    Past Surgical History:  Procedure Laterality Date  . ABDOMINAL AORTAGRAM N/A 01/07/2012   Procedure: ABDOMINAL Maxcine Ham;  Surgeon: Angelia Mould, MD;  Location: [facility] CATH LAB;  Service: Cardiovascular;  Laterality: N/A;  . BALLOON DILATION N/A 02/17/2013   Procedure: BALLOON DILATION;  Surgeon: Jerene Bears, MD;  Location: [facility] ENDOSCOPY;  Service: Gastroenterology;  Laterality: N/A;  . CAROTID ENDARTERECTOMY Right 03/2009  . CHOLECYSTECTOMY    . CIRCUMCISION    . COLONOSCOPY WITH PROPOFOL N/A 02/17/2013   Procedure: COLONOSCOPY WITH PROPOFOL;  Surgeon: Jerene Bears, MD;  Location: [facility] ENDOS[facility];  Service: Gastroenterology;  Laterality: N/A;  . CORONARY ARTERY BYPASS GRAFT  06/14/2011   Procedure: CORONARY ARTERY BYPASS GRAFTING (CABG);  Surgeon: Gaye Pollack, MD;  Location: [facility];  Service: Open Heart Surgery;  Laterality: N/A;  Coronary Artery Bypass Graft on pump times six;  utilizing internal mammary artery and right greater saphenous vein harvested endoscopically.x 5 vessels  . ELECTROPHYSIOLOGY STUDY N/A 11/05/2012   Procedure: ELECTROPHYSIOLOGY STUDY;  Surgeon: Deboraha Sprang, MD;  Location: [facility] CATH LAB;  Service: Cardiovascular;  Laterality: N/A;  . ESOPHAGOGASTRODUODENOSCOPY (EGD) WITH PROPOFOL N/A 02/17/2013   Procedure: ESOPHAGOGASTRODUODENOSCOPY (EGD) WITH PROPOFOL;  Surgeon: Jerene Bears, MD;  Location: [facility]OS[facility];  Service: Gastroenterology;  Laterality: N/A;  . FEMORAL-POPLITEAL BYPASS GRAFT Left 03/06/2012   Procedure: BYPASS GRAFT FEMORAL-POPLITEAL ARTERY;  Surgeon: Angelia Mould, MD;  Location: [facility];  Service: Vascular;  Laterality: Left;  Left Femoral - Below Knee Popliteal Bypass  Graft with Vein and Intraoperative Arteriogram.  . INSERT / REPLACE / REMOVE PACEMAKER  2013   /notes 11/05/2012-Medtronic  . JOINT REPLACEMENT Left    left TKA  . KNEE ARTHROSCOPY Left    "I had 3" (11/05/2012)  . LEAD REVISION  11/05/2012   pacemaker/notes 11/05/2012  . LEAD REVISION N/A 11/05/2012   Procedure: LEAD REVISION;  Surgeon: Deboraha Sprang, MD;  Location: [facility] CATH LAB;  Service: Cardiovascular;  Laterality: N/A;  . MAZE  06/14/2011   Procedure: MAZE;  Surgeon: Gaye Pollack, MD;  Location: [facility];  Service: Open Heart Surgery;  Laterality: N/A;  [place] left atrial appendage  . PERMANENT PACEMAKER INSERTION N/A 06/20/2011   Procedure: PERMANENT PACEMAKER INSERTION;  Surgeon: Deboraha Sprang, MD;  Location: [facility] CATH LAB;  Service: Cardiovascular;  Laterality: N/A;  . TONSILLECTOMY AND ADENOIDECTOMY    . TOTAL KNEE ARTHROPLASTY Left         Home Medications    Prior to Admission medications   Medication Sig Start Date End Date Taking? Authorizing Provider  acetaminophen (TYLENOL 8 HOUR ARTHRITIS PAIN) 650 MG  CR tablet Take 650 mg by mouth every 8 (eight) hours as needed for pain.    [provider]  albuterol (PROVENTIL HFA;VENTOLIN HFA) 108 (90 Base) MCG/ACT inhaler USE 2 PUFFS EVERY 4 HOURS AS NEEDED FOR WHEEZING 11/11/17   Nahser, Wonda Cheng, MD  amLODipine (NORVASC) 5 MG tablet TAKE 1 TABLET BY MOUTH EVERY DAY 12/17/17   Dunn, Nedra Hai, PA-C  apixaban (ELIQUIS) 2.5 MG TABS tablet Take 1.25 mg by mouth 2 (two) times daily.     [provider]  blood glucose meter kit and supplies Dispense based on patient and insurance preference. Use up to four times daily as directed. (FOR ICD-10 E10.9, E11.9). 02/18/17   Scot Jun, FNP  carvedilol (COREG) 25 MG tablet TAKE 1 TABLET TWICE A DAY WITH A MEAL 11/14/17   Nahser, Wonda Cheng, MD  cholecalciferol (VITAMIN D) 1000 units tablet Take 1,000 Units by mouth daily.    [provider]  cyanocobalamin 100 MCG  tablet Take 500 mcg by mouth daily.    [provider]  furosemide (LASIX) 40 MG tablet TAKE 2 TABLETS BY MOUTH EVERY MORNING AND TAKE 1 TABLET IN THE AFTERNOON 03/18/18   Nahser, Wonda Cheng, MD  gabapentin (NEURONTIN) 300 MG capsule Take 100 mg by mouth See admin instructions. Take 100 mg in the morning and 200 mg in the evening    [provider]  insulin regular (NOVOLIN R,HUMULIN R) 100 units/mL injection Inject 60 Units into the skin 2 (two) times daily before a meal.     [provider]  Iron Combinations (IRON COMPLEX PO) Take 2 capsules by mouth daily.    [provider]  KLOR-CON M20 20 MEQ tablet TAKE 1 TABLET BY MOUTH EVERY DAY 10/14/17   Nahser, Wonda Cheng, MD  loratadine (CLARITIN) 10 MG tablet Take 10 mg by mouth daily.    [provider]  Magnesium Oxide 400 MG CAPS Take 800 mg by mouth every evening.     [provider]  Multiple Vitamin (MULTIVITAMIN WITH MINERALS) TABS Take 1 tablet by mouth daily.    [provider]  omeprazole (PRILOSEC) 20 MG capsule Take 20 mg by mouth 2 (two) times daily before a meal.    [provider]  pravastatin (PRAVACHOL) 80 MG tablet TAKE 1 TABLET (80 MG TOTAL) BY MOUTH DAILY. 10/14/17   Nahser, Wonda Cheng, MD  Semaglutide (OZEMPIC) 1 MG/DOSE SOPN Inject 1 mg into the skin once a week.    [provider]  testosterone cypionate (DEPOTESTOSTERONE CYPIONATE) 200 MG/ML injection Inject 200 mg into the muscle every 14 (fourteen) days.    [provider]  valsartan (DIOVAN) 80 MG tablet Take 1 tablet (80 mg total) by mouth daily. 04/21/18   Deboraha Sprang, MD    Family History Family History  Problem Relation Age of Onset  . Coronary artery disease Father   . Hypertension Father   . Heart disease Father        Heart Disease before age 42  . Heart attack Mother   . Coronary artery disease Mother   . Deep vein thrombosis Mother   . Heart disease Mother        Heart  Disease before age 42  . Hyperlipidemia Mother   . Hypertension Mother   . Hypertension Sister   . Heart disease Sister        Heart Disease before age 70  . Hyperlipidemia Sister   . Heart disease  Brother        Heart Disease before age 18  . Colon cancer Brother 45  . Anesthesia problems Neg Hx   . Diabetes Neg Hx     Social History Social History   Tobacco Use  . Smoking status: Former Smoker    Packs/day: 3.00    Years: 32.00    Pack years: 96.00    Types: Cigarettes    Last attempt to quit: 01/16/1979    Years since quitting: 39.3  . Smokeless tobacco: Never Used  Substance Use Topics  . Alcohol use: Yes    Alcohol/week: 2.0 standard drinks    Types: 2 Glasses of wine per week    Comment: beer 1-2 weekly (occasional)  . Drug use: No     Allergies   Lipitor [atorvastatin calcium]   Review of Systems Review of Systems  All other systems reviewed and are negative.    Physical Exam Updated Vital Signs BP 137/75   Pulse 67   Temp 97.8 F (36.6 C) (Oral)   Resp 12   Ht 5' 10"  (1.778 m)   Wt 120.7 kg   SpO2 97%   BMI 38.17 kg/m   Physical Exam Vitals signs and nursing note reviewed.  Constitutional:      General: He is not in acute distress.    Appearance: He is well-developed.  HENT:     Head: Atraumatic.     Mouth/Throat:     Mouth: Mucous membranes are moist.  Eyes:     Conjunctiva/sclera: Conjunctivae normal.  Neck:     Musculoskeletal: Normal range of motion and neck supple.  Cardiovascular:     Rate and Rhythm: Normal rate and regular rhythm.     Pulses: Normal pulses.     Heart sounds: Normal heart sounds.  Pulmonary:     Effort: Pulmonary effort is normal.     Breath sounds: Normal breath sounds.  Abdominal:     Palpations: Abdomen is soft.     Tenderness: There is no abdominal tenderness.  Skin:    Capillary Refill: Capillary refill takes less than 2 seconds.     Findings: No rash.  Neurological:     Mental Status: He is alert  and oriented to person, place, and time.     GCS: GCS eye subscore is 4. GCS verbal subscore is 5. GCS motor subscore is 6.     Cranial Nerves: Cranial nerves are intact.     Motor: Motor function is intact.     Coordination: Coordination is intact.     Gait: Gait is intact.      ED Treatments / Results  Labs (all labs ordered are listed, but only abnormal results are displayed) Labs Reviewed  COMPREHENSIVE METABOLIC PANEL - Abnormal; Notable for the following components:      Result Value   BUN 25 (*)    Creatinine, Ser 2.34 (*)    Total Bilirubin 1.9 (*)    GFR calc non Af Amer 26 (*)    GFR calc Af Amer 30 (*)    All other components within normal limits  CBC WITH DIFFERENTIAL/PLATELET - Abnormal; Notable for the following components:   RDW 17.6 (*)    Monocytes Absolute 1.2 (*)    All other components within normal limits  CBG MONITORING, ED - Abnormal; Notable for the following components:   Glucose-Capillary 60 (*)    All other components within normal limits  URINALYSIS, COMPLETE (UACMP) WITH MICROSCOPIC  CBG MONITORING, ED  EKG EKG Interpretation  Date/Time:  Saturday May 03 2018 11:17:16 EDT Ventricular Rate:  70 PR Interval:    QRS Duration: 113 QT Interval:  430 QTC Calculation: 464 R Axis:   91 Text Interpretation:  Atrial fibrillation Borderline intraventricular conduction delay Borderline low voltage, extremity leads Repol abnrm, global ischemia, diffuse leads Confirmed by Davonna Belling [phone]) on 05/03/2018 12:17:30 PM   Radiology No results found.  Procedures .Critical Care Performed by: Domenic Moras, PA-C Authorized by: Domenic Moras, PA-C   Critical care provider statement:    Critical care time (minutes):  45   Critical care was time spent personally by me on the following activities:  Discussions with consultants, evaluation of patient's response to treatment, examination of patient, ordering and performing treatments and interventions,  ordering and review of laboratory studies, ordering and review of radiographic studies, pulse oximetry, re-evaluation of patient's condition, obtaining history from patient or surrogate and review of old charts   (including critical care time)  Medications Ordered in ED Medications  dextrose 50 % solution 50 mL (50 mLs Intravenous Given 05/03/18 1142)     Initial Impression / Assessment and Plan / ED Course  I have reviewed the triage vital signs and the nursing notes.  Pertinent labs & imaging results that were available during my care of the patient were reviewed by me and considered in my medical decision making (see chart for details).        BP 137/75   Pulse 67   Temp 97.8 F (36.6 C) (Oral)   Resp 12   Ht 5' 10"  (1.778 m)   Wt 120.7 kg   SpO2 97%   BMI 38.17 kg/m    Final Clinical Impressions(s) / ED Diagnoses   Final diagnoses:  Hypoglycemia    ED Discharge Orders    None     11:33 AM Patient was found altered this morning by wife, he was noted to be hypoglycemic with a initial CBG of 63 when EMS initially evaluated him.  Symptoms did improve after he received glucose.  At this time he is mentating appropriately, alert and oriented x4.  On recheck, CBG is 60, will give orange juice and crackers as well as an amp of D50.  Work-up initiated.  He does not have any focal neuro deficit to suggest a stroke.  Suspect altered mental status secondary to hypoglycemia.  2:25 PM Patient has been observed in the ED for the past 4 hours.  CBG normalized.  He feels comfortable going home.   Domenic Moras, PA-C 05/03/18 1441    Davonna Belling, MD 05/03/18 [phone]

## 2018-05-03 NOTE — Discharge Instructions (Signed)
Your episode of confusion is likely due to low blood sugar. Please monitor your blood sugar closely before administering insulin.  Return if your condition worsen or if you have other concerns.

## 2018-05-03 NOTE — Telephone Encounter (Signed)
Patient's wife called stating she thinks he had a stroke last night.  He is acting strange, slurring speech and cannot stand up.  Instructed his wife to call [phone] immediately.Wife agrees with plan and will call now

## 2018-05-04 NOTE — Telephone Encounter (Signed)
Agree with the recommendations that Edward [name] go to the hospitl

## 2018-05-08 ENCOUNTER — Ambulatory Visit: Payer: Federal, State, Local not specified - PPO | Admitting: Internal Medicine

## 2018-06-07 ENCOUNTER — Other Ambulatory Visit: Payer: Self-pay | Admitting: Physician Assistant

## 2018-06-07 DIAGNOSIS — I10 Essential (primary) hypertension: Secondary | ICD-10-CM

## 2018-06-11 ENCOUNTER — Encounter: Payer: Self-pay | Admitting: *Deleted

## 2018-06-12 ENCOUNTER — Other Ambulatory Visit: Payer: Self-pay

## 2018-06-12 ENCOUNTER — Encounter: Payer: Self-pay | Admitting: Internal Medicine

## 2018-06-12 ENCOUNTER — Ambulatory Visit (INDEPENDENT_AMBULATORY_CARE_PROVIDER_SITE_OTHER): Payer: Federal, State, Local not specified - PPO | Admitting: Internal Medicine

## 2018-06-12 VITALS — Wt 225.0 lb

## 2018-06-12 DIAGNOSIS — Z86010 Personal history of colonic polyps: Secondary | ICD-10-CM | POA: Diagnosis not present

## 2018-06-12 DIAGNOSIS — D500 Iron deficiency anemia secondary to blood loss (chronic): Secondary | ICD-10-CM | POA: Diagnosis not present

## 2018-06-12 DIAGNOSIS — Z7901 Long term (current) use of anticoagulants: Secondary | ICD-10-CM | POA: Diagnosis not present

## 2018-06-12 DIAGNOSIS — Z8719 Personal history of other diseases of the digestive system: Secondary | ICD-10-CM | POA: Diagnosis not present

## 2018-06-12 NOTE — Patient Instructions (Addendum)
We will hold off on colonoscopy for now and discuss again in 1 year.  Continue omeprazole 20 mg twice daily.  Follow-up in one year with Dr Hilarie Fredrickson; sooner if needed.

## 2018-06-12 NOTE — Progress Notes (Signed)
Subjective:   This service was provided via telemedicine.  Telephone encounter. The patient was located at home The provider was located in provider's GI office. The patient did consent to this telephone visit and is aware of possible charges through their insurance for this visit.   The persons participating in this telemedicine service were the patient and I. Time spent on call: 10 min     Patient ID: Edward [name], male    DOB: July 27, 1940, 78 y.o.   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  He is seen virtually today in the setting of COVID-19.  He was last seen in the office approximately 14 months ago.  He reports that of late he has been feeling better.  After seeing Dr. Caryl Comes and found to be overloaded with fluid he has had diuretic management and also aggressive diet changes.  He reports he started drinking seltzer water which he is found helpful.  He has been able to lose approximately 30 pounds over the last several months.  With this his blood sugars have come under much better control and he has not needed insulin.  He is eating a very low carbohydrate and low sugar diet.  He denies upper GI and hepatobiliary complaint.  Reports stools are loose which is his usual and dark while he is on oral iron.  He has not seen blood in his stool.  No lower abdominal pain.  He is not interested in colonoscopy at this time.  Recent labs reveal a normal hemoglobin.  His last colonoscopy was performed by me in February 2015.  He had one 4 mm adenoma removed from the cecum.  There was diverticulosis throughout the entire colon as well as internal and external hemorrhoids.   Review of Systems As per HPI, otherwise negative   Current Medications, Allergies, Past Medical History, Past Surgical History, Family History and Social History were reviewed in [employer] record.     Objective:   Physical Exam No PE, virtual visit  CBC    Component Value Date/Time   WBC 9.1 05/03/2018 1128   RBC 5.02 05/03/2018 1128   HGB 13.2 05/03/2018 1128   HCT 43.1 05/03/2018 1128   PLT 173 05/03/2018 1128   MCV 85.9 05/03/2018 1128   MCV 74.2 (A) 02/15/2015 1345   MCH 26.3 05/03/2018 1128   MCHC 30.6 05/03/2018 1128   RDW 17.6 (H) 05/03/2018 1128   LYMPHSABS 0.7 05/03/2018 1128   MONOABS 1.2 (H) 05/03/2018 1128   EOSABS 0.4 05/03/2018 1128   BASOSABS 0.1 05/03/2018 1128   CMP     Component Value Date/Time   NA 139 05/03/2018 1128   NA 140 04/04/2018 1100   K 4.2 05/03/2018 1128   CL 104 05/03/2018 1128   CO2 23 05/03/2018 1128   GLUCOSE 70 05/03/2018 1128   BUN 25 (H) 05/03/2018 1128   BUN 35 (H) 04/04/2018 1100   CREATININE 2.34 (H) 05/03/2018 1128   CREATININE 1.76 (H) 02/06/2016 1124   CALCIUM 8.9 05/03/2018 1128   PROT 7.5 05/03/2018 1128   PROT 7.3 08/02/2017 1649   ALBUMIN 4.0 05/03/2018 1128   ALBUMIN 4.2 08/02/2017 1649   AST 26 05/03/2018 1128   ALT 15 05/03/2018 1128   ALKPHOS 98 05/03/2018 1128  BILITOT 1.9 (H) 05/03/2018 1128   BILITOT 0.7 08/02/2017 1649   GFRNONAA 26 (L) 05/03/2018 1128   GFRAA 30 (L) 05/03/2018 1128       Assessment & Plan:  78 year old male with a history of 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   1.  History of colon polyps --surveillance colonoscopy considered today.  His last colonoscopy was just over 5 years ago.  This procedure is higher than baseline risk with his medical comorbidities, chronic anticoagulation and in the setting of the COVID-19  pandemic.  He understands this risk and wishes to delay consideration of repeating a colonoscopy for 1 year.  I think this is reasonable.  Even in 1 year we may deem this test as more risk than benefit. --Delay colonoscopy for at least 1 year, repeat office visit in 1 year  2.  History of iron deficiency anemia --on oral iron with stable blood counts.  No overt GI blood loss even with anticoagulation.  3.  History of erosive gastritis --he is on twice daily omeprazole.  He will continue omeprazole 20 mg twice daily.  Follow-up in about a year, sooner if needed

## 2018-06-13 ENCOUNTER — Ambulatory Visit: Payer: Federal, State, Local not specified - PPO | Admitting: Internal Medicine

## 2018-06-28 ENCOUNTER — Telehealth: Payer: Self-pay | Admitting: Student

## 2018-06-28 MED ORDER — SPIRONOLACTONE 25 MG PO TABS: 12.5000 mg | ORAL_TABLET | Freq: Every day | ORAL | Status: DC

## 2018-06-28 NOTE — Telephone Encounter (Signed)
    Patient's wife called reporting soft BP over the past several days. Spironolactone was recently reduced from 29m to 12.559mdaily on Wednesday and Amlodipine was discontinued around that time as well.   This morning after taking his Spironolactone and Coreg, his BP was 81/40. Was asymptomatic at that time. I recommended they reduce his Coreg from 2553mID to 12.5mg46mD and hold the dose if SBP less than 100. They will continue to follow his readings closely.   She voiced understanding of this and was appreciative of the call.   Signed, BritErma Heritage-C 06/28/2018, 11:56 AM Pager: [phone]

## 2018-06-29 ENCOUNTER — Other Ambulatory Visit: Payer: Self-pay | Admitting: Cardiovascular Disease

## 2018-07-11 ENCOUNTER — Other Ambulatory Visit: Payer: Self-pay | Admitting: Cardiovascular Disease

## 2018-07-11 DIAGNOSIS — M25561 Pain in right knee: Secondary | ICD-10-CM | POA: Insufficient documentation

## 2018-07-25 ENCOUNTER — Encounter: Payer: Self-pay | Admitting: Cardiovascular Disease

## 2018-07-25 ENCOUNTER — Telehealth: Payer: Self-pay | Admitting: Cardiovascular Disease

## 2018-07-25 ENCOUNTER — Other Ambulatory Visit: Payer: Self-pay

## 2018-07-25 ENCOUNTER — Ambulatory Visit: Payer: Federal, State, Local not specified - PPO | Admitting: Cardiovascular Disease

## 2018-07-25 VITALS — BP 122/68 | HR 69 | Ht 70.0 in | Wt 220.0 lb

## 2018-07-25 DIAGNOSIS — I25708 Atherosclerosis of coronary artery bypass graft(s), unspecified, with other forms of angina pectoris: Secondary | ICD-10-CM

## 2018-07-25 DIAGNOSIS — I480 Paroxysmal atrial fibrillation: Secondary | ICD-10-CM

## 2018-07-25 DIAGNOSIS — I10 Essential (primary) hypertension: Secondary | ICD-10-CM | POA: Diagnosis not present

## 2018-07-25 DIAGNOSIS — I5032 Chronic diastolic (congestive) heart failure: Secondary | ICD-10-CM | POA: Diagnosis not present

## 2018-07-25 MED ORDER — SILDENAFIL CITRATE 50 MG PO TABS: ORAL_TABLET | ORAL | 2 refills | Status: DC

## 2018-07-25 NOTE — Patient Instructions (Signed)
Medication Instructions:  Your physician has recommended you make the following change in your medication:   STOP Carvedilol (Coreg) Sildenafil 50 mg (take 1-2 tabs) as needed for sexual intercourse   If you need a refill on your cardiac medications before your next appointment, please call your pharmacy.   Lab work: None Ordered  If you have labs (blood work) drawn today and your tests are completely normal, you will receive your results only by: Marland Kitchen MyChart Message (if you have MyChart) OR . A paper copy in the mail If you have any lab test that is abnormal or we need to change your treatment, we will call you to review the results.  Testing/Procedures: None Ordered   Follow-Up: At [facility], you and your health needs are our priority.  As part of our continuing mission to provide you with exceptional heart care, we have created designated Provider Care Teams.  These Care Teams include your primary Cardiologist (physician) and Advanced Practice Providers (APPs -  Physician Assistants and Nurse Practitioners) who all work together to provide you with the care you need, when you need it. You will need a follow up appointment in:  6 months.  Please call our office 2 months in advance to schedule this appointment.  You may see Mertie Moores, MD or one of the following Advanced Practice Providers on your designated Care Team: [name], PA-C [place], [place] . Daune Perch, NP

## 2018-07-25 NOTE — Telephone Encounter (Signed)

## 2018-07-25 NOTE — Progress Notes (Signed)
CARDIOLOGY OFFICE NOTE  Date:  02/15/2016    [name] Date of Birth: [birth year] Medical Record #[id number]  PCP:  Molli Barrows, FNP    Cardiologist:         Chief Complaint  Patient presents with  . Shortness of Breath        Previous notes.  Edward [name] is a 78 y.o. male who presents today for a follow up visit. Seen for Dr. Acie Fredrickson.   He has a history of afib s/p MAZE w/ CABG 05/2011 (LIMA-LAD, SVG-D1, SVG-D2, SVG-CFX, SVG-PDA-PL), DM, HTN, HLD, PAD s/p R-CEA &L fem-pop, TIA, & anemia. He has PPM in place - followed by Dr. Caryl Comes.    Seen about a week ago as a work in by [employer], [employer] for shortness of breath and wheezing. There was felt to be some component of volume overload - his diuretics were increased for a few days. He was to come back to [place] for further follow up.   Comes in today. Here with his wife. He is quite abrasive. Very hard to get any history out of him. Will not look at me. Hard to say if he is better. Weight is down. Seems more bothered by cough. Has been to the [facility] noted. Given inhaler. Cough not productive. Has chest discomfort - sounds like it is from coughing but again - he really does not wish to give me any history.   Spoke with Dr. Acie Fredrickson here in the office - apparently this patient is not to see male providers due to past behavior.   Jan. 31, 2018  I was asked to see Buster today on an urgent basis.  He was seen by Cecille Rubin.    She asked me to see him because he was being belligerent with her .  Has lost 17 lbs with lasix 40 BID . Still complains of cough.   CT of chest in March 2017 showed small pleural effusion .  Mild COPD,  Pulmonary art. Enlargement .  Coughing up brown sputum.  Advised him to see pulmonary  - he wants to ask the [facility] to send him to the [place] pulmonologist.   May 03, 2016:  Edward [name] is seen today for follow up of his worsening dyspnea.  Feeling much better . No CP ,     Oct. 1, 2018:   Seen for DOE ,  Still has some DOE ,  Also has some lightheadness right after he stands up .   January 03, 2017:  Edward [name] is seen today for follow-up visit. Is a bit more short of breath with the cold Denies any angina. Has asked for a prescription for sildenafil.  August 02, 2017:  Edward [name] is seen today for follow-up of his congestive heart failure and coronary artery disease. He has a history of afib s/p MAZE w/ CABG 05/2011 (LIMA-LAD, SVG-D1, SVG-D2, SVG-CFX, SVG-PDA-PL), DM, HTN, HLD, PAD s/p R-CEA &L fem-pop, TIA, & anemia. He has PPM in place - followed by Dr. Caryl Comes.   hes been to several doctor visits today  Wife broke her elbow 3 weeks ago   Overall not doing as well as he would like  Has lots of leg weakness and fatigue  Follows up with nephrology also  Wt. Today is 245  February 13, 2018: Edward [name] is seen today for a follow-up visit.  His weight today is 260 pounds which is up 15 pounds from 6 months ago.    Weighed  with his jacket and pockets full .  Weight is up 25 pounds from a year ago.  Has had lots of shortness of breath and DOE Was found to have low testosterone ,  Was given some testosterone cream.    Also was given the option of injections or tablets.  Has not started this yet .    Previous echocardiogram from January, 2018 reveals normal left ventricular systolic function.  We are not able to assess diastolic function because of his atrial fibrillation.  July 25, 2018 :  He was having some cp when I last saw in Jan. h He has lost 40 lbs and feels better .  Wt today is 220        Past Medical History:  Diagnosis Date  . Anemia    ?  . Carotid artery occlusion   . Chronic diastolic CHF (congestive heart failure) ([place]) 04/23/2012  . Claudication ([place])   . Coronary artery disease    s/p CABG 2013  . Diabetic neuropathy (HCC)    mostly feet/ legs  . Dyslipidemia   . Exertional shortness of breath   . GERD (gastroesophageal  reflux disease)   . GI bleed    while on triple anticoag therapy  . HTN (hypertension)   . Hyperlipidemia   . Neuromuscular disorder (HCC)    neuropathy  . Pacemaker   . PAF (paroxysmal atrial fibrillation) (HCC)    s/p MAZE at time of CABG  . PVD (peripheral vascular disease) ([place])    R CEA 2011, prior PTA, left fem-pop 2014  . Stroke ([place])    vs TIA  . Type II diabetes mellitus ([place])          Past Surgical History:  Procedure Laterality Date  . ABDOMINAL AORTAGRAM N/A 01/07/2012   Procedure: ABDOMINAL Maxcine Ham;  Surgeon: Angelia Mould, MD;  [facility] C[facility];  Service: Cardiovascular;  Laterality: N/A;  . BALLOON DILATION N/A 02/17/2013   Procedure: BALLOON DILATION;  Surgeon: Jerene Bears, MD;  Location: [facility] ENDOSCOPY;  Service: Gastroenterology;  Laterality: N/A;  . CAROTID ENDARTERECTOMY Right 03/2009  . CHOLECYSTECTOMY    . CIRCUMCISION    . COLONOSCOPY WITH PROPOFOL N/A 02/17/2013   Procedure: COLONOSCOPY WITH PROPOFOL;  Surgeon: Jerene Bears, MD;  Location: [facility];  Service: Gastroenterology;  Laterality: N/A;  . CORONARY ARTERY BYPASS GRAFT  06/14/2011   Procedure: CORONARY ARTERY BYPASS GRAFTING (CABG);  Surgeon: Gaye Pollack, MD;  Location: [facility];  Service: Open Heart Surgery;  Laterality: N/A;  Coronary Artery Bypass Graft on pump times six;  utilizing internal mammary artery and right greater saphenous vein harvested endoscopically.x 5 vessels  . ELECTROPHYSIOLOGY STUDY N/A 11/05/2012   Procedure: ELECTROPHYSIOLOGY STUDY;  Surgeon: Deboraha Sprang, MD;  Location: [facility] CATH LAB;  Service: Cardiovascular;  Laterality: N/A;  . ESOPHAGOGASTRODUODENOSCOPY (EGD) WITH PROPOFOL N/A 02/17/2013   Procedure: ESOPHAGOGASTRODUODENOSCOPY (EGD) WITH PROPOFOL;  Surgeon: Jerene Bears, MD;  Location: [facility];  Service: Gastroenterology;  Laterality: N/A;  . FEMORAL-POPLITEAL BYPASS GRAFT Left 03/06/2012   Procedure: BYPASS GRAFT  FEMORAL-POPLITEAL ARTERY;  Surgeon: Angelia Mould, MD;  Location: [facility];  Service: Vascular;  Laterality: Left;  Left Femoral - Below Knee Popliteal Bypass Graft with Vein and Intraoperative Arteriogram.  . INSERT / REPLACE / REMOVE PACEMAKER  2013   /notes 11/05/2012-[employer]  . JOINT REPLACEMENT Left    left TKA  . KNEE ARTHROSCOPY Left    "I had 3" (11/05/2012)  .  LEAD REVISION  11/05/2012   pacemaker/notes 11/05/2012  . LEAD REVISION N/A 11/05/2012   Procedure: LEAD REVISION;  Surgeon: Deboraha Sprang, MD;  Location: [facility] CATH LAB;  Service: Cardiovascular;  Laterality: N/A;  . MAZE  06/14/2011   Procedure: MAZE;  Surgeon: Gaye Pollack, MD;  Location: [facility];  Service: Open Heart Surgery;  Laterality: N/A;  Ligate left atrial appendage  . PERMANENT PACEMAKER INSERTION N/A 06/20/2011   Procedure: PERMANENT PACEMAKER INSERTION;  Surgeon: Deboraha Sprang, MD;  Location: [facility] CATH LAB;  Service: Cardiovascular;  Laterality: N/A;  . TONSILLECTOMY AND ADENOIDECTOMY    . TOTAL KNEE ARTHROPLASTY Left      Medications:       Current Outpatient Prescriptions  Medication Sig Dispense Refill  . acetaminophen (TYLENOL) 500 MG tablet Take 500 mg by mouth every 6 (six) hours as needed for mild pain or moderate pain.     Marland Kitchen amLODipine (NORVASC) 5 MG tablet Take 1 tablet (5 mg total) by mouth daily. 90 tablet 3  . apixaban (ELIQUIS) 5 MG TABS tablet Take 1 tablet (5 mg total) by mouth 2 (two) times daily. 180 tablet 3  . benzonatate (TESSALON) 100 MG capsule Take 100 mg by mouth 4 (four) times daily as needed for cough.    . carvedilol (COREG) 25 MG tablet TAKE 1 TABLET (25 MG TOTAL) BY MOUTH 2 (TWO) TIMES DAILY. 180 tablet 3  . diphenhydramine-acetaminophen (TYLENOL PM) 25-500 MG TABS Take 1 tablet by mouth at bedtime as needed (sleep).     . famotidine (PEPCID) 20 MG tablet Take 20 mg by mouth daily.    . furosemide (LASIX) 40 MG tablet TAKE 2 TABLETS BY MOUTH EVERY DAY  180 tablet 2  . glipiZIDE (GLUCOTROL) 5 MG tablet Take 5 mg by mouth 3 (three) times daily.     Marland Kitchen guaiFENesin (ROBITUSSIN) 100 MG/5ML SOLN Take 5 mLs by mouth every 4 (four) hours as needed for cough or to loosen phlegm.    . Iron Combinations (IRON COMPLEX PO) Take 2 capsules by mouth daily.    Marland Kitchen KLOR-CON M20 20 MEQ tablet TAKE 1 TABLET BY MOUTH EVERY DAY 90 tablet 3  . L-Methylfolate-Algae-B12-B6 (METANX) 3-90.314-2-35 MG CAPS TAKE ONE CAPSULE BY MOUTH EVERY DAY 15 capsule 0  . liraglutide (VICTOZA) 18 MG/3ML SOPN Inject 18 mg into the skin daily.    Marland Kitchen loratadine (CLARITIN) 10 MG tablet Take 10 mg by mouth daily.    Marland Kitchen losartan (COZAAR) 100 MG tablet TAKE 1 TABLET BY MOUTH EVERY DAY 90 tablet 3  . Magnesium Oxide 420 MG TABS Take 420 mg by mouth daily.    . metFORMIN (GLUCOPHAGE) 1000 MG tablet Take 1,000 mg by mouth 2 (two) times daily with a meal.    . Multiple Vitamin (MULTIVITAMIN WITH MINERALS) TABS Take 1 tablet by mouth daily.    Marland Kitchen NOVOTWIST 32G X 5 MM MISC     . omeprazole (PRILOSEC) 40 MG capsule TAKE 1 CAPSULE 30 TO 60 MINUTES BEFORE YOUR FIRST AND LAST MEALS OF THE DAY 60 capsule 2  . ONETOUCH DELICA LANCETS FINE MISC 1 each by other route daily. 1/day 250.62 100 each 3  . ONETOUCH VERIO test strip     . pravastatin (PRAVACHOL) 80 MG tablet TAKE 1 TABLET (80 MG TOTAL) BY MOUTH DAILY. 90 tablet 3  . triamcinolone cream (KENALOG) 0.1 % Apply 1 application topically as needed (rash).      No current facility-administered medications for this  visit.     Allergies:      Allergies  Allergen Reactions  . Lipitor [Atorvastatin Calcium] Other (See Comments)    Reaction unknown    Social History: The patient  reports that he quit smoking about 37 years ago. His smoking use included Cigarettes. He has a 96.00 pack-year smoking history. He has never used smokeless tobacco. He reports that he drinks about 1.2 oz of alcohol per week . He reports that he  does not use drugs.   Family History: The patient's family history includes Coronary artery disease in his father and mother; Deep vein thrombosis in his mother; Heart attack in his mother; Heart disease in his brother, father, mother, and sister; Hyperlipidemia in his mother and sister; Hypertension in his father, mother, and sister.   Review of Systems: Please see the history of present illness.   Otherwise, the review of systems is positive for none.   All other systems are reviewed and negative.    Physical Exam: Blood pressure 122/68, pulse 69, height 5' 10"  (1.778 m), weight 220 lb (99.8 kg), SpO2 97 %.  GEN:    Elderly male,  NAD HEENT: Normal NECK: No JVD; No carotid bruits LYMPHATICS: No lymphadenopathy CARDIAC: RR with occasional premature beats.    RESPIRATORY:  Clear to auscultation without rales, wheezing or rhonchi  ABDOMEN: Soft, non-tender, non-distended MUSCULOSKELETAL:   Trace edema,  Chronic stasis changes.  SKIN: Warm and dry NEUROLOGIC:  Alert and oriented x 3   LABORATORY DATA:  EKG:     Recent Labs       Lab Results  Component Value Date   WBC 9.5 12/07/2015   HGB 12.6 (L) 12/07/2015   HCT 39.3 12/07/2015   PLT 214 12/07/2015   GLUCOSE 182 (H) 02/06/2016   CHOL 101 12/07/2015   TRIG 142 12/07/2015   HDL 31 (L) 12/07/2015   LDLCALC 42 12/07/2015   ALT 17 12/07/2015   AST 25 12/07/2015   NA 136 02/06/2016   K 4.8 02/06/2016   CL 100 02/06/2016   CREATININE 1.76 (H) 02/06/2016   BUN 20 02/06/2016   CO2 20 02/06/2016   TSH 3.28 12/07/2015   PSA 0.59 09/20/2012   INR 0.94 02/29/2012   HGBA1C 6.9 (H) 09/22/2013   MICROALBUR 56.2 (H) 07/08/2013      BNP (last 3 results)  Recent Labs (within last 365 days)   Recent Labs  02/06/16 1124  BNP 120.2*      ProBNP (last 3 results) Recent Labs (within last 365 days)  No results for input(s): PROBNP in the last 8760 hours.     Other Studies Reviewed  Today:  ECHO: 01/26/2016 - Left ventricle: The cavity size was normal. Wall thickness was increased in a pattern of moderate LVH. There was focal basal hypertrophy. Systolic function was normal. The estimated ejection fraction was in the range of 50% to 55%. - Mitral valve: Moderately calcified annulus. - Left atrium: The atrium was mildly dilated. - Right atrium: The atrium was moderately to severely dilated.  MYOVIEW: 02/25/2013 Overall Impression: This is a low risk scan with no significant change since January, 2014. There is a small/moderate area of scar at the apex. This has been noted in the past. There is no ischemia. LV Ejection Fraction: 53%. LV Wall Motion: Mild hypokinesis at the apex.  Assessment/Plan:  1. Dyspnea on exertion:     Much better after 40 lb weight loss  Is eating better.    2. CAD:  No recent angina    3. CKD stage III:       5. PAF .   6.  Erectile dysfunction:    Will write script for Sildenafil for him to try   Current medicines are reviewed with the patient today.  The patient does not have concerns regarding medicines other than what has been noted above.  The following changes have been made:  See above.  Labs/ tests ordered today include:   No orders of the defined types were placed in this encounter.    Will see him in 6 months    Mertie Moores, MD  07/25/2018 3:46 PM    [address],  [place], [place]  [address] Pager [phone] Phone: [phone]; Fax: [phone]

## 2018-08-08 NOTE — Telephone Encounter (Signed)
Called and spoke with Pt regarding  1) role of Sub Q ICD-- no indication for ICD at all given EF 55% 3/20 2) Concern regarding stroke, reviewed the benefit of the anticoagulation and the disconnect between atrial fib events and strokes 3) impact of MAZE on atrial electrical activity and being distinct from mechanical activity and that no A waves have been seen per dr Lurena Nida for some time, notwithstanding evidence of atrial capture on ECG 4) benefit of 45 lb wt loss ( > 15 % of body weight) on dyspnea 5) Worsening renal function Cr now 3.2 :(( and that with above weight loss) 6) if we were to try and recoup atrial electrical activity three options suggest themselves  A- DCCV  Is he in afib w low amplitude fib waves?  B- R sided mapping perhaps with CARTO to identify potential pacing lead targets  C- L sided mapping, trying to find an atrial branch by way of CS   For now given that he is feeling much better largely associated with the weight loss, have elected to do nothing

## 2018-08-18 ENCOUNTER — Other Ambulatory Visit: Payer: Self-pay | Admitting: Cardiovascular Disease

## 2018-09-02 ENCOUNTER — Other Ambulatory Visit: Payer: Self-pay | Admitting: Physician Assistant

## 2018-09-02 DIAGNOSIS — I10 Essential (primary) hypertension: Secondary | ICD-10-CM

## 2018-10-28 ENCOUNTER — Encounter: Payer: Self-pay | Admitting: Registered Nurse

## 2018-10-28 ENCOUNTER — Other Ambulatory Visit: Payer: Self-pay

## 2018-10-28 ENCOUNTER — Ambulatory Visit: Payer: Federal, State, Local not specified - PPO | Admitting: Registered Nurse

## 2018-10-28 VITALS — BP 122/66 | HR 69 | Temp 97.9°F | Resp 16 | Ht 70.0 in | Wt 205.0 lb

## 2018-10-28 DIAGNOSIS — E1122 Type 2 diabetes mellitus with diabetic chronic kidney disease: Secondary | ICD-10-CM

## 2018-10-28 DIAGNOSIS — N183 Chronic kidney disease, stage 3 unspecified: Secondary | ICD-10-CM | POA: Diagnosis not present

## 2018-10-28 DIAGNOSIS — Z7689 Persons encountering health services in other specified circumstances: Secondary | ICD-10-CM

## 2018-10-28 NOTE — Progress Notes (Signed)
New Patient Office Visit  Subjective:  Patient ID: Edward [name], male    DOB: November 18, 1940  Age: 78 y.o. MRN: [id number]  CC:  Chief Complaint  Patient presents with  . Establish Care    HPI Edward [name] presents for visit to establish care.  He is normally seen at the [facility] in [place] for his care. He wishes to continue care there for management of his chronic conditions. He is here today, he states, to re-establish so he will have an accessible provider when the [facility] is busy and Urgent care has waits that are too long.   We discussed his medical history, current medications, and had an in depth discussion regarding COVID. He is up to date on vaccinations, including this year's flu vaccine.    Past Medical History:  Diagnosis Date  . Anemia    ?  . Carotid artery occlusion   . Chronic diastolic CHF (congestive heart failure) ([place]) 04/23/2012  . CKD (chronic kidney disease), stage III   . Claudication ([place])   . Coronary artery disease    s/p CABG 2013  . Diabetes ([place])   . Diabetic neuropathy (HCC)    mostly feet/ legs  . Diverticulosis   . Dyslipidemia   . Exertional shortness of breath   . GERD (gastroesophageal reflux disease)   . GI bleed    while on triple anticoag therapy  . HTN (hypertension)   . Hyperlipidemia   . Neuromuscular disorder (HCC)    neuropathy  . Pacemaker   . PAF (paroxysmal atrial fibrillation) (HCC)    s/p MAZE at time of CABG  . PVD (peripheral vascular disease) ([place])    R CEA 2011, prior PTA, left fem-pop 2014  . Stroke ([place])    vs TIA  . Tubular adenoma of colon   . Type II diabetes mellitus ([place])     Past Surgical History:  Procedure Laterality Date  . ABDOMINAL AORTAGRAM N/A 01/07/2012   Procedure: ABDOMINAL Maxcine Ham;  Surgeon: Angelia Mould, MD;  Location: [facility] CATH LAB;  Service: Cardiovascular;  Laterality: N/A;  . BALLOON DILATION N/A 02/17/2013   Procedure: BALLOON DILATION;  Surgeon: Jerene Bears, MD;  Location:  [facility] ENDOSCOPY;  Service: Gastroenterology;  Laterality: N/A;  . CAROTID ENDARTERECTOMY Right 03/2009  . CATARACT EXTRACTION Bilateral   . CHOLECYSTECTOMY    . CIRCUMCISION    . COLONOSCOPY WITH PROPOFOL N/A 02/17/2013   Procedure: COLONOSCOPY WITH PROPOFOL;  Surgeon: Jerene Bears, MD;  Location: [facility] ENDOSCOPY;  Service: Gastroenterology;  Laterality: N/A;  . CORONARY ARTERY BYPASS GRAFT  06/14/2011   Procedure: CORONARY ARTERY BYPASS GRAFTING (CABG);  Surgeon: Gaye Pollack, MD;  Location: [facility];  Service: Open Heart Surgery;  Laterality: N/A;  Coronary Artery Bypass Graft on pump times six;  utilizing internal mammary artery and right greater saphenous vein harvested endoscopically.x 5 vessels  . ELECTROPHYSIOLOGY STUDY N/A 11/05/2012   Procedure: ELECTROPHYSIOLOGY STUDY;  Surgeon: Deboraha Sprang, MD;  Location: [facility] CATH LAB;  Service: Cardiovascular;  Laterality: N/A;  . ESOPHAGOGASTRODUODENOSCOPY (EGD) WITH PROPOFOL N/A 02/17/2013   Procedure: ESOPHAGOGASTRODUODENOSCOPY (EGD) WITH PROPOFOL;  Surgeon: Jerene Bears, MD;  Location: [facility] ENDOSCOPY;  Service: Gastroenterology;  Laterality: N/A;  . FEMORAL-POPLITEAL BYPASS GRAFT Left 03/06/2012   Procedure: BYPASS GRAFT FEMORAL-POPLITEAL ARTERY;  Surgeon: Angelia Mould, MD;  Location: [facility] OR;  Service: Vascular;  Laterality: Left;  Left Femoral - Below Knee Popliteal Bypass Graft with Vein and Intraoperative Arteriogram.  .  JOINT REPLACEMENT Left    left TKA  . KNEE ARTHROSCOPY Left    "I had 3" (11/05/2012)  . LEAD REVISION  11/05/2012   pacemaker/notes 11/05/2012  . LEAD REVISION N/A 11/05/2012   Procedure: LEAD REVISION;  Surgeon: Deboraha Sprang, MD;  Location: [facility] CATH LAB;  Service: Cardiovascular;  Laterality: N/A;  . MAZE  06/14/2011   Procedure: MAZE;  Surgeon: Gaye Pollack, MD;  Location: [facility];  Service: Open Heart Surgery;  Laterality: N/A;  Ligate left atrial appendage  . PERMANENT PACEMAKER INSERTION N/A 06/20/2011   Procedure:  PERMANENT PACEMAKER INSERTION;  Surgeon: Deboraha Sprang, MD;  Location: [facility] CATH LAB;  Service: Cardiovascular;  Laterality: N/A;  . TONSILLECTOMY AND ADENOIDECTOMY    . TOTAL KNEE ARTHROPLASTY Left     Family History  Problem Relation Age of Onset  . Coronary artery disease Father   . Hypertension Father   . Heart disease Father        Heart Disease before age 13  . Heart attack Mother   . Coronary artery disease Mother   . Deep vein thrombosis Mother   . Heart disease Mother        Heart Disease before age 32  . Hyperlipidemia Mother   . Hypertension Mother   . Hypertension Sister   . Heart disease Sister        Heart Disease before age 75  . Hyperlipidemia Sister   . Heart disease Brother        Heart Disease before age 11  . Colon cancer Brother 40  . Anesthesia problems Neg Hx   . Diabetes Neg Hx     Social History   Socioeconomic History  . Marital status: Married    Spouse name: Not on file  . Number of children: Not on file  . Years of education: Not on file  . Highest education level: Not on file  Occupational History  . Not on file  Social Needs  . Financial resource strain: Not on file  . Food insecurity    Worry: Not on file    Inability: Not on file  . Transportation needs    Medical: Not on file    Non-medical: Not on file  Tobacco Use  . Smoking status: Former Smoker    Packs/day: 3.00    Years: 32.00    Pack years: 96.00    Types: Cigarettes    Quit date: 01/16/1979    Years since quitting: 39.8  . Smokeless tobacco: Never Used  Substance and Sexual Activity  . Alcohol use: Yes    Alcohol/week: 2.0 standard drinks    Types: 2 Glasses of wine per week    Comment: beer 1-2 weekly (occasional)  . Drug use: No  . Sexual activity: Not Currently  Lifestyle  . Physical activity    Days per week: Not on file    Minutes per session: Not on file  . Stress: Not on file  Relationships  . Social Herbalist on phone: Not on file    Gets  together: Not on file    Attends religious service: Not on file    Active member of club or organization: Not on file    Attends meetings of clubs or organizations: Not on file    Relationship status: Not on file  . Intimate partner violence    Fear of current or ex partner: Not on file    Emotionally abused: Not on  file    Physically abused: Not on file    Forced sexual activity: Not on file  Other Topics Concern  . Not on file  Social History Narrative  . Not on file    ROS Review of Systems  Constitutional: Negative.   HENT: Negative.   Eyes: Negative.   Respiratory: Negative.   Cardiovascular: Negative.   Gastrointestinal: Negative.   Endocrine: Negative.   Genitourinary: Negative.   Musculoskeletal: Negative.   Skin: Negative.   Allergic/Immunologic: Negative.   Neurological: Negative.   Hematological: Negative.   Psychiatric/Behavioral: Negative.   All other systems reviewed and are negative.   Objective:   Today's Vitals: BP 122/66   Pulse 69   Temp 97.9 F (36.6 C) (Oral)   Resp 16   Ht 5' 10"  (1.778 m)   Wt 205 lb (93 kg)   SpO2 98%   BMI 29.41 kg/m   Physical Exam Vitals signs and nursing note reviewed.  Constitutional:      General: He is not in acute distress.    Appearance: Normal appearance. He is not ill-appearing, toxic-appearing or diaphoretic.  Cardiovascular:     Rate and Rhythm: Normal rate and regular rhythm.  Pulmonary:     Effort: Pulmonary effort is normal. No respiratory distress.  Neurological:     General: No focal deficit present.     Mental Status: He is alert. Mental status is at baseline.  Psychiatric:        Mood and Affect: Mood normal.        Behavior: Behavior normal.        Thought Content: Thought content normal.        Judgment: Judgment normal.     Assessment & Plan:   Problem List Items Addressed This Visit    None      Outpatient Encounter Medications as of 10/28/2018  Medication Sig  . acetaminophen  (TYLENOL 8 HOUR ARTHRITIS PAIN) 650 MG CR tablet Take 650 mg by mouth every 8 (eight) hours as needed for pain.  Marland Kitchen albuterol (PROVENTIL HFA;VENTOLIN HFA) 108 (90 Base) MCG/ACT inhaler USE 2 PUFFS EVERY 4 HOURS AS NEEDED FOR WHEEZING  . apixaban (ELIQUIS) 2.5 MG TABS tablet Take 1.25 mg by mouth 2 (two) times daily.   . blood glucose meter kit and supplies Dispense based on patient and insurance preference. Use up to four times daily as directed. (FOR ICD-10 E10.9, E11.9).  . cholecalciferol (VITAMIN D) 1000 units tablet Take 1,000 Units by mouth daily.  . Cyanocobalamin 500 MCG TBDP Take 1 tablet by mouth daily.  . furosemide (LASIX) 40 MG tablet TAKE 2 TABLETS BY MOUTH EVERY MORNING AND TAKE 1 TABLET IN THE AFTERNOON  . gabapentin (NEURONTIN) 100 MG capsule Take 1 tablet every morning and 2 tablets by mouth every evening.  . insulin regular (NOVOLIN R,HUMULIN R) 100 units/mL injection Inject 30 Units into the skin 2 (two) times daily before a meal. As needed  . Iron Combinations (IRON COMPLEX PO) Take 1 capsule by mouth daily.   Marland Kitchen loratadine (CLARITIN) 10 MG tablet Take 10 mg by mouth daily.  . Magnesium Oxide 400 MG CAPS Take 800 mg by mouth every evening.   . pravastatin (PRAVACHOL) 80 MG tablet TAKE 1 TABLET (80 MG TOTAL) BY MOUTH DAILY.  Marland Kitchen Semaglutide (OZEMPIC) 1 MG/DOSE SOPN Inject 1 mg into the skin once a week.  . sildenafil (VIAGRA) 50 MG tablet May take 1-2 tablets as needed for sexual intercourse  . spironolactone (  ALDACTONE) 25 MG tablet Take 0.5 tablets (12.5 mg total) by mouth daily.  Marland Kitchen testosterone cypionate (DEPOTESTOSTERONE CYPIONATE) 200 MG/ML injection Inject 200 mg into the muscle every 14 (fourteen) days.   No facility-administered encounter medications on file as of 10/28/2018.     Follow-up: No follow-ups on file.   PLAN  Today was largely spent reviewing Mr. Sabol medical history and medications  We also spent time discussing COVID-19 and the general course of  the disease, as well as typical treatments provided in the outpatient setting.  He understands that we can sometimes make same day appointments and offer virtual appointments if he needs  Patient encouraged to call clinic with any questions, comments, or concerns.  I spent 45 minutes with this patient, more than 50% of which was spent counseling/educating.   Maximiano Coss, NP

## 2018-10-28 NOTE — Patient Instructions (Signed)
° ° ° °  If you have lab work done today you will be contacted with your lab results within the next 2 weeks.  If you have not heard from us then please contact us. The fastest way to get your results is to register for My Chart. ° ° °IF you received an x-ray today, you will receive an invoice from [facility]. Please contact  Radiology at [phone] with questions or concerns regarding your invoice.  ° °IF you received labwork today, you will receive an invoice from [facility]. Please contact [facility] at [phone] with questions or concerns regarding your invoice.  ° °Our billing staff will not be able to assist you with questions regarding bills from these companies. ° °You will be contacted with the lab results as soon as they are available. The fastest way to get your results is to activate your My Chart account. Instructions are located on the last page of this paperwork. If you have not heard from us regarding the results in 2 weeks, please contact this office. °  ° ° ° °

## 2018-10-29 ENCOUNTER — Encounter: Payer: Self-pay | Admitting: Registered Nurse

## 2018-11-12 ENCOUNTER — Encounter: Payer: Self-pay | Admitting: Registered Nurse

## 2018-11-15 ENCOUNTER — Encounter: Payer: Self-pay | Admitting: Registered Nurse

## 2018-11-17 ENCOUNTER — Other Ambulatory Visit: Payer: Self-pay

## 2018-11-17 DIAGNOSIS — Z20822 Contact with and (suspected) exposure to covid-19: Secondary | ICD-10-CM

## 2018-11-18 LAB — NOVEL CORONAVIRUS, NAA: SARS-CoV-2, NAA: NOT DETECTED

## 2018-12-07 ENCOUNTER — Encounter: Payer: Self-pay | Admitting: Registered Nurse

## 2019-01-24 ENCOUNTER — Other Ambulatory Visit: Payer: Self-pay | Admitting: Physician Assistant

## 2019-01-24 ENCOUNTER — Other Ambulatory Visit: Payer: Self-pay | Admitting: Cardiovascular Disease

## 2019-01-24 DIAGNOSIS — I10 Essential (primary) hypertension: Secondary | ICD-10-CM

## 2019-01-27 ENCOUNTER — Other Ambulatory Visit: Payer: Self-pay | Admitting: Physician Assistant

## 2019-01-27 ENCOUNTER — Other Ambulatory Visit: Payer: Self-pay | Admitting: Cardiovascular Disease

## 2019-01-27 DIAGNOSIS — I10 Essential (primary) hypertension: Secondary | ICD-10-CM

## 2019-01-29 ENCOUNTER — Ambulatory Visit: Payer: Federal, State, Local not specified - PPO | Attending: Internal Medicine

## 2019-01-29 DIAGNOSIS — Z23 Encounter for immunization: Secondary | ICD-10-CM | POA: Diagnosis present

## 2019-01-29 NOTE — Progress Notes (Signed)
   Covid-19 Vaccination Clinic  Name:  MING MCMANNIS    MRN: [id number] DOB: 1940/06/30  01/29/2019  Mr. Njoku was observed post Covid-19 immunization for 15 minutes without incidence. He was provided with Vaccine Information Sheet and instruction to access the V-Safe system.   Mr. Weissberg was instructed to call 911 with any severe reactions post vaccine: Marland Kitchen Difficulty breathing  . Swelling of your face and throat  . A fast heartbeat  . A bad rash all over your body  . Dizziness and weakness    Immunizations Administered    Name Date Dose VIS Date Route   Pfizer COVID-19 Vaccine 01/29/2019 12:00 PM 0.3 mL 12/26/2018 Intramuscular   Manufacturer: [place]   Lot: [id number]   [place]: [id number]

## 2019-02-11 ENCOUNTER — Ambulatory Visit: Payer: Federal, State, Local not specified - PPO | Admitting: Cardiovascular Disease

## 2019-02-12 NOTE — Progress Notes (Signed)
Cardiology Office Note:    Date:  02/13/2019   ID:  [name], DOB Jul 28, 1940, MRN [id number]  PCP:  Maximiano Coss, NP  Cardiologist:  Mertie Moores, MD  Electrophysiologist:  Virl Axe, MD   Referring MD: Maximiano Coss, NP   Chief Complaint:  Follow-up (CAD, AFib)    Patient Profile:    Edward [name] is a 79 y.o. male with:   Coronary artery disease   s/p CABG in 2013  Myoview 10/18: ant-apical scar, no ischemia, EF 60; Low Risk   Atrial fibrillation   S/p Maze     Chronic Diastolic CHF  Peripheral Arterial Disease    S/p L fem pop bypass  Carotid artey disease  S/p R CEA   Chronic kidney disease   Diabetes mellitus   Hypertension   Hyperlipidemia   Sinus node dysfunction   S/p Pacemaker  Erectile dysfunction   Prior CV studies:  Echocardiogram 02/21/2018 EF 55-60, mod LVH, mild reduced RVSF, mild RVE, mod RAE, mild MAC  Carotid US 03/13/17 Final Interpretation:  Right Carotid: Patent right carotid endarterectomy with no evidence for         restenosis.  Left Carotid: Velocities in the left ICA are consistent with a 1-39%  stenosis.   Myoview 19/4/18  Nuclear stress EF: 60%. The left ventricular ejection fraction is normal (55-65%).  Defect 1: There is a large defect of severe severity present in the mid inferolateral, apical anterior, apical septal, apical inferior, apical lateral and apex location.  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  This is a low risk study.  This is unchanged from previous myoview    History of Present Illness:    Edward [name] was last seen in clinic by Dr. Acie Fredrickson in 07/2018.    He returns for follow-up.  He is here alone.  He has had some bilateral hand numbness.  This is been evaluated at the [facility].  He also sees nephrology at the [facility].  He goes to [facility].  He has not had chest discomfort, significant shortness of breath, syncope, orthopnea, bleeding.  He has had some  mild pedal edema without significant change.     Past Medical History:  Diagnosis Date  . Anemia    ?  . Carotid artery occlusion   . Chronic diastolic CHF (congestive heart failure) ([place]) 04/23/2012  . CKD (chronic kidney disease), stage III   . Claudication ([place])   . Coronary artery disease    s/p CABG 2013  . Diabetes ([place])   . Diabetic neuropathy (HCC)    mostly feet/ legs  . Diverticulosis   . Dyslipidemia   . Exertional shortness of breath   . GERD (gastroesophageal reflux disease)   . GI bleed    while on triple anticoag therapy  . HTN (hypertension)   . Hyperlipidemia   . Neuromuscular disorder (HCC)    neuropathy  . Pacemaker   . PAF (paroxysmal atrial fibrillation) (HCC)    s/p MAZE at time of CABG  . PVD (peripheral vascular disease) ([name])    R CEA 2011, prior PTA, left fem-pop 2014  . Stroke ([place])    vs TIA  . Tubular adenoma of colon   . Type II diabetes mellitus (HCC)     Current Medications: Current Meds  Medication Sig  . acetaminophen (TYLENOL 8 HOUR ARTHRITIS PAIN) 650 MG CR tablet Take 650 mg by mouth every 8 (eight) hours as needed for pain.  Marland Kitchen albuterol (PROVENTIL HFA;VENTOLIN HFA)  108 (90 Base) MCG/ACT inhaler USE 2 PUFFS EVERY 4 HOURS AS NEEDED FOR WHEEZING  . allopurinol (ZYLOPRIM) 100 MG tablet Take 100 mg by mouth daily.  Marland Kitchen apixaban (ELIQUIS) 2.5 MG TABS tablet Take 1.25 mg by mouth 2 (two) times daily.   . blood glucose meter kit and supplies Dispense based on patient and insurance preference. Use up to four times daily as directed. (FOR ICD-10 E10.9, E11.9).  . carvedilol (COREG) 12.5 MG tablet Take 12.5 mg by mouth daily.  . cholecalciferol (VITAMIN D) 1000 units tablet Take 1,000 Units by mouth daily.  . Cyanocobalamin 500 MCG TBDP Take 1 tablet by mouth daily.  . ferrous sulfate 325 (65 FE) MG tablet Take 325 mg by mouth daily with breakfast.  . furosemide (LASIX) 40 MG tablet TAKE 2 TABLETS BY MOUTH EVERY MORNING AND TAKE 1 TABLET IN THE  AFTERNOON  . gabapentin (NEURONTIN) 100 MG capsule Take 1 tablet every morning and 2 tablets by mouth every evening.  . insulin regular (NOVOLIN R,HUMULIN R) 100 units/mL injection Inject 30 Units into the skin 2 (two) times daily before a meal. As needed  . loratadine (CLARITIN) 10 MG tablet Take 10 mg by mouth daily.  Marland Kitchen MAGNESIUM PO Take 850 mg by mouth daily.  . Multiple Vitamin (MULTIVITAMIN) capsule Take 1 capsule by mouth daily.  Marland Kitchen omeprazole (PRILOSEC) 20 MG capsule Take 20 mg by mouth 2 (two) times daily.  . Potassium 99 MG TABS Take 99 mg by mouth daily.  . rosuvastatin (CRESTOR) 20 MG tablet Take 20 mg by mouth daily.  . Semaglutide (OZEMPIC) 1 MG/DOSE SOPN Inject 1 mg into the skin once a week.  . sildenafil (VIAGRA) 50 MG tablet May take 1-2 tablets as needed for sexual intercourse  . spironolactone (ALDACTONE) 25 MG tablet Take 0.5 tablets (12.5 mg total) by mouth daily.  Marland Kitchen testosterone cypionate (DEPOTESTOSTERONE CYPIONATE) 200 MG/ML injection Inject 200 mg into the muscle every 14 (fourteen) days.  . valsartan (DIOVAN) 80 MG tablet Take 80 mg by mouth daily.     Allergies:   Lipitor [atorvastatin calcium]   Social History   Tobacco Use  . Smoking status: Former Smoker    Packs/day: 3.00    Years: 32.00    Pack years: 96.00    Types: Cigarettes    Quit date: 01/16/1979    Years since quitting: 40.1  . Smokeless tobacco: Never Used  Substance Use Topics  . Alcohol use: Yes    Alcohol/week: 2.0 standard drinks    Types: 2 Glasses of wine per week    Comment: beer 1-2 weekly (occasional)  . Drug use: No     Family Hx: The patient's family history includes Colon cancer (age of onset: 79) in his brother; Coronary artery disease in his father and mother; Deep vein thrombosis in his mother; Heart attack in his mother; Heart disease in his brother, father, mother, and sister; Hyperlipidemia in his mother and sister; Hypertension in his father, mother, and sister. There is no  history of Anesthesia problems or Diabetes.  Review of Systems  Gastrointestinal: Negative for hematochezia and melena.  Genitourinary: Negative for hematuria.     EKGs/Labs/Other Test Reviewed:    EKG:  EKG is   ordered today.  The ekg ordered today demonstrates ventricular paced, HR 63  Recent Labs: 05/03/2018: ALT 15; BUN 25; Creatinine, Ser 2.34; Hemoglobin 13.2; Platelets 173; Potassium 4.2; Sodium 139   Recent Lipid Panel Lab Results  Component Value Date/Time  CHOL 91 (L) 08/02/2017 04:49 PM   TRIG 86 08/02/2017 04:49 PM   HDL 34 (L) 08/02/2017 04:49 PM   CHOLHDL 2.7 08/02/2017 04:49 PM   CHOLHDL 3.3 12/07/2015 10:34 AM   LDLCALC 40 08/02/2017 04:49 PM    Physical Exam:    VS:  BP 122/72   Pulse 80   Ht _0  (1.778 m)   Wt 210 lb (95.3 kg)   SpO2 94%   BMI 30.13 kg/m     Wt Readings from Last 3 Encounters:  02/13/19 210 lb (95.3 kg)  10/28/18 205 lb (93 kg)  07/25/18 220 lb (99.8 kg)     Constitutional:      Appearance: Healthy appearance. Not in distress.  Neck:     Thyroid: Thyroid normal.     Vascular: JVD normal.  Pulmonary:     Effort: Pulmonary effort is normal.     Breath sounds: No wheezing. No rales.  Cardiovascular:     Normal rate. Regular rhythm. Normal S1. Normal S2.     Murmurs: There is no murmur.  Edema:    Peripheral edema absent.  Abdominal:     Palpations: Abdomen is soft. There is no hepatomegaly.  Skin:    General: Skin is warm and dry.  Neurological:     General: No focal deficit present.     Mental Status: Alert and oriented to person, place and time.     Cranial Nerves: Cranial nerves are intact.      ASSESSMENT & PLAN:    1. PAF (paroxysmal atrial fibrillation) ([place]) S/p Maze procedure at the time of his CABG in 2013.  He is on a very low dose of Apixaban.  As he is < 80 and his weight is > 60 kg, he really should be on Apixaban 5 mg twice daily.  I am not sure how he ended up on such a low dose of Apixaban. This  dose is not effective in reducing his risk stroke.  He has been on this for a long time.  I will review with Dr. Acie Fredrickson and Dr. Caryl Comes.    2. Coronary artery disease involving coronary bypass graft of native heart without angina pectoris Hx of CABG in 2013.  Myoview in 2018 was low risk.  He is doing well without angina.  He is not on aspirin as he is on Apixaban.  Continue high intensity statin therapy with rosuvastatin.  3. Chronic diastolic CHF (congestive heart failure) (HCC) Overall, volume status appears to be stable.  He is NYHA II.  Continue current dose of furosemide.  4. CKD (chronic kidney disease) stage 4, GFR 15-29 ml/min (HCC) Most recent creatinine 2.34.  He is followed by nephrology at the [facility] in [place].  He had recent lab work and will send these in via [place].  5. Essential hypertension The patient's blood pressure is controlled on his current regimen.  Continue current therapy.   6. PAOD (peripheral arterial occlusive disease) ([place]) He is followed chronically by vascular surgery.  7. Pacemaker-Medtronic He has not had his pacemaker interrogated in almost a year.  He was under the impression that it is no longer functional.  He is ventricular pacing on ECG today.  I will make sure he has follow-up with our device clinic.  It appears that his underlying rhythm is probably atrial fibrillation today.   Dispo:  Return in about 6 months (around 08/13/2019) for Routine Follow Up, w/ Dr. Acie Fredrickson, in person.   Medication Adjustments/Labs and  Tests Ordered: Current medicines are reviewed at length with the patient today.  Concerns regarding medicines are outlined above.  Tests Ordered: Orders Placed This Encounter  Procedures  . EKG 12-Lead   Medication Changes: No orders of the defined types were placed in this encounter.   Signed, [name], PA-C  02/13/2019 11:45 AM    [name], [place], [place]  [address] Phone: [phone]; Fax: [phone]

## 2019-02-13 ENCOUNTER — Ambulatory Visit: Payer: Federal, State, Local not specified - PPO | Admitting: Physician Assistant

## 2019-02-13 ENCOUNTER — Encounter: Payer: Self-pay | Admitting: Physician Assistant

## 2019-02-13 ENCOUNTER — Other Ambulatory Visit: Payer: Self-pay

## 2019-02-13 VITALS — BP 122/72 | HR 80 | Ht 70.0 in | Wt 210.0 lb

## 2019-02-13 DIAGNOSIS — N184 Chronic kidney disease, stage 4 (severe): Secondary | ICD-10-CM | POA: Diagnosis not present

## 2019-02-13 DIAGNOSIS — I10 Essential (primary) hypertension: Secondary | ICD-10-CM

## 2019-02-13 DIAGNOSIS — I480 Paroxysmal atrial fibrillation: Secondary | ICD-10-CM

## 2019-02-13 DIAGNOSIS — I779 Disorder of arteries and arterioles, unspecified: Secondary | ICD-10-CM

## 2019-02-13 DIAGNOSIS — Z950 Presence of cardiac pacemaker: Secondary | ICD-10-CM

## 2019-02-13 DIAGNOSIS — I25810 Atherosclerosis of coronary artery bypass graft(s) without angina pectoris: Secondary | ICD-10-CM

## 2019-02-13 DIAGNOSIS — I5032 Chronic diastolic (congestive) heart failure: Secondary | ICD-10-CM | POA: Diagnosis not present

## 2019-02-13 NOTE — Patient Instructions (Signed)
Medication Instructions:   Your physician recommends that you continue on your current medications as directed. Please refer to the Current Medication list given to you today.  *If you need a refill on your cardiac medications before your next appointment, please call your pharmacy*  Lab Work:  None ordered today  If you have labs (blood work) drawn today and your tests are completely normal, you will receive your results only by: Marland Kitchen MyChart Message (if you have MyChart) OR . A paper copy in the mail If you have any lab test that is abnormal or we need to change your treatment, we will call you to review the results.  Testing/Procedures:  None ordered today  Follow-Up: At [facility], you and your health needs are our priority.  As part of our continuing mission to provide you with exceptional heart care, we have created designated Provider Care Teams.  These Care Teams include your primary Cardiologist (physician) and Advanced Practice Providers (APPs -  Physician Assistants and Nurse Practitioners) who all work together to provide you with the care you need, when you need it.  Your next appointment:   6 month(s)  The format for your next appointment:   In Person  Provider:   You may see Mertie Moores, MD or one of the following Advanced Practice Providers on your designated Care Team:    [name], PA-C  [place], [place]  Daune Perch, [place]

## 2019-02-16 ENCOUNTER — Other Ambulatory Visit: Payer: Self-pay | Admitting: Cardiovascular Disease

## 2019-02-17 ENCOUNTER — Ambulatory Visit: Payer: Federal, State, Local not specified - PPO | Attending: Internal Medicine

## 2019-02-17 ENCOUNTER — Telehealth: Payer: Self-pay

## 2019-02-17 DIAGNOSIS — Z23 Encounter for immunization: Secondary | ICD-10-CM | POA: Insufficient documentation

## 2019-02-17 NOTE — Progress Notes (Signed)
   Covid-19 Vaccination Clinic  Name:  Edward [name]    MRN: [id number] DOB: Jan 26, 1940  02/17/2019  [name] was observed post Covid-19 immunization for 15 minutes without incidence. He was provided with Vaccine Information Sheet and instruction to access the V-Safe system.   [name] was instructed to call 911 with any severe reactions post vaccine: Marland Kitchen Difficulty breathing  . Swelling of your face and throat  . A fast heartbeat  . A bad rash all over your body  . Dizziness and weakness    Immunizations Administered    Name Date Dose VIS Date Route   Pfizer COVID-19 Vaccine 02/17/2019 11:23 AM 0.3 mL 12/26/2018 Intramuscular   Manufacturer: [place]   Lot: [id number]   [place]: [address]-5

## 2019-02-17 NOTE — Telephone Encounter (Signed)
Delayed entry.  On Friday, 02/13/19, staff shared with me how their interaction with Mr. Collingsworth made them feel disrespected. He made demanding and stereotypical comments, such as "all you women are alike". While the employee was reviewing his medication list in the computer, she needed to look back and forth between the patient and the computer screen. Mr. Kennan stated that she was "just like a woman not making eye contact" and demanded that she "will look at [him] when speaking". She tried to explain that she was referring to the computer to review his med list at which time he shared a list of current medications that he had written and brought with him. He told her she no longer needed to ask him about any of the others. This made it hard for our team to confidently reconcile the patient's medication list because there were many active medications in the chart that were not on his written list and she hesitated to discontinue them in his chart without truly confirming that he no longer took them.   After reviewing his chart, it appears that other members of our team have been made to feel uncomfortable by Mr. Morlock. At my request, [name] joined me for a quick conversation with Mr. Cooksey to let him know that his comments made our staff feel uncomfortable and disrespectful. He shared that he was unaware of this because the staff seem to chuckle/laugh when they were in his presence. I explained that their laughter is their way of getting through that uncomfortable interaction but it actually really hurts them, they just don't show it in front of him.  He accepted my feedback and apologized. I asked that he be more mindful and refrain from this type of banter in the future. He agreed.

## 2019-02-19 ENCOUNTER — Other Ambulatory Visit: Payer: Self-pay | Admitting: Cardiovascular Disease

## 2019-02-20 ENCOUNTER — Telehealth: Payer: Self-pay | Admitting: *Deleted

## 2019-02-20 NOTE — Telephone Encounter (Signed)
Patient's wife sent a message through her my chart account requesting refill of Carvedilol for patient. I spoke with [facility]'s [facility] and [facility]  [facility] 25 [facility] twice daily [facility] patient's profile ordered by Dr Acie Fredrickson. Last picked up by patient on May 12,2020.  They have sent request to our office for refill of this dose and received response that patient was taking 12.5 mg.  Chart reviewed and when patient saw [name], PA on 02/13/19 medication list indicates patient takes Coreg 12.5 mg daily and is listed as an historical medication.  Wife and patient do not know who directed patient to decrease dose.  Patient has enough medication to last until Tuesday. [facility] and let us know.  I will also send message to Dr Acie Fredrickson to see which dose he recommends patient take.  I explained to patient's wife I wanted to make sure medication was refilled correctly. I also asked her to use patient's my chart account when sending messages for him to prevent confusion.

## 2019-02-23 NOTE — Telephone Encounter (Signed)
Spoke with pt and he asked that I speak with his wife.  Wife states that pt is only taking Coreg 12.87m QAM.  [place] [place] to [place].   We discussed that taking Coreg QD is not therapeutic.  Advised I will send to Dr. NAcie Fredricksonto review. Wife states this is what he was taking when he seen [facility]

## 2019-02-23 NOTE — Telephone Encounter (Signed)
His HR and BP were ideal at his last visit with [name].  The note reflects that his dose of Coreg was 12.5 mg BID at that time.  Apparently there is now some doubt about what he was taking .   Please verify the dose that he was on during that office visit .   We should continue that dose going forward.  Please remind him to bring his medicine bottles with him to every office visit     Mertie Moores, MD  02/23/2019 3:16 PM    [address],  [place],   [address] Phone: [phone]; Fax: [phone]

## 2019-02-24 ENCOUNTER — Telehealth: Payer: Self-pay | Admitting: Physician Assistant

## 2019-02-24 MED ORDER — CARVEDILOL 6.25 MG PO TABS: 6.2500 mg | ORAL_TABLET | Freq: Two times a day (BID) | ORAL | 3 refills | Status: DC

## 2019-02-24 NOTE — Telephone Encounter (Signed)
Called to discuss medication changes with patient. Patient's wife states she is the one that puts his pills into his daily pill box and that the patient brought the note to her from the [place] that stated patient needs to reduce carvedilol to 12.5 mg daily. I explained that carvedilol is made to be taken twice daily and advised Dr. Acie Fredrickson wants patient to take 6.25 mg twice daily to equal the same dosage. Wife verbalized understanding and agreement and states she will pick up the new Rx at their pharmacy. She thanked me for the call.

## 2019-02-24 NOTE — Addendum Note (Signed)
Addended by: Emmaline Life on: 02/24/2019 01:28 PM   Modules accepted: Orders

## 2019-02-24 NOTE — Telephone Encounter (Signed)
Edward [name], Please call the patient.  I spoke with Dr. Acie Fredrickson about his dose of Apixaban (Eliquis).  Dr. Acie Fredrickson agrees that he should be on a higher dose of Apixaban.   Based upon his weight and age, the appropriate dose is Apxaban 5 mg twice daily. PLAN: 1. Increase Apixaban (Eliquis) to 5 mg twice daily. [name], PA-C    02/24/2019 1:38 PM

## 2019-02-24 NOTE — Telephone Encounter (Signed)
I called and spoke with patients wife Myril-OK per patient DPR. Myril is aware to have patient increase Eliquis to 5 mg, 1 tablet by mouth twice a day. Myril states that patient has 5 mg tablets and does not need a new prescription right now.

## 2019-02-24 NOTE — Telephone Encounter (Signed)
It appears that Edward [name] is only taking Coreg 12.5 mg QD.   Lets change his dose to 6.25 mg BID

## 2019-02-24 NOTE — Telephone Encounter (Addendum)
-----   Message from [name], MD sent at 02/23/2019  3:30 PM EST -----  He should be on Eliquis 5 mg   Thanks  PHil  ----- Message ----- From: Sharmon Revere Sent: 02/13/2019   7:15 PM EST To: Deboraha Sprang, MD, [name], MD  Edward [name] I saw Edward [name] today.  I noticed he is on Apixaban 1.25 mg twice daily. It looks like he has been on this dose for a long time. He was not sure why he is on this dose.  His weight is > 60 kg and his age is < 78.  His Creatinine is in the 2 range, but he should ideally be on 5 mg twice daily.  I tried to find the reason in his chart for the low dose but could not find it.  Do you know why he is on such a low dose of Apixaban? Thanks [employer]

## 2019-04-15 ENCOUNTER — Telehealth: Payer: Self-pay

## 2019-04-15 NOTE — Telephone Encounter (Signed)
[facility] [place]

## 2019-05-11 ENCOUNTER — Telehealth: Payer: Self-pay | Admitting: Nurse Practitioner

## 2019-05-11 NOTE — Telephone Encounter (Signed)
Called patient in response to a MyChart message he sent. Patient states he meant to have his wife send a message to the [place] instead of to Dr. Acie Fredrickson. He states the [place] in [place] is handling all of his medical issues right now and are most up to date with all of his problems and medications. He was grateful for my call and will await follow-up from [place].

## 2019-06-05 ENCOUNTER — Encounter: Payer: Self-pay | Admitting: Internal Medicine

## 2019-08-13 ENCOUNTER — Other Ambulatory Visit: Payer: Self-pay | Admitting: Neurological Surgery

## 2019-08-13 DIAGNOSIS — D496 Neoplasm of unspecified behavior of brain: Secondary | ICD-10-CM

## 2019-08-27 ENCOUNTER — Inpatient Hospital Stay: Admission: RE | Admit: 2019-08-27 | Payer: Federal, State, Local not specified - PPO | Source: Ambulatory Visit

## 2019-11-05 DIAGNOSIS — M48062 Spinal stenosis, lumbar region with neurogenic claudication: Secondary | ICD-10-CM | POA: Insufficient documentation

## 2020-03-01 ENCOUNTER — Other Ambulatory Visit: Payer: Self-pay

## 2020-03-01 DIAGNOSIS — I739 Peripheral vascular disease, unspecified: Secondary | ICD-10-CM

## 2020-03-01 DIAGNOSIS — I779 Disorder of arteries and arterioles, unspecified: Secondary | ICD-10-CM

## 2020-03-18 ENCOUNTER — Ambulatory Visit (INDEPENDENT_AMBULATORY_CARE_PROVIDER_SITE_OTHER)
Admission: RE | Admit: 2020-03-18 | Discharge: 2020-03-18 | Disposition: A | Discharge status: Home / Self Care | Payer: Federal, State, Local not specified - PPO | Source: Ambulatory Visit | Attending: Vascular Surgery | Admitting: Vascular Surgery

## 2020-03-18 ENCOUNTER — Other Ambulatory Visit: Payer: Self-pay

## 2020-03-18 ENCOUNTER — Ambulatory Visit (HOSPITAL_COMMUNITY)
Admission: RE | Admit: 2020-03-18 | Discharge: 2020-03-18 | Disposition: A | Discharge status: Home / Self Care | Payer: Federal, State, Local not specified - PPO | Source: Ambulatory Visit | Attending: Vascular Surgery | Admitting: Vascular Surgery

## 2020-03-18 ENCOUNTER — Ambulatory Visit: Payer: Federal, State, Local not specified - PPO | Admitting: Physician Assistant

## 2020-03-18 VITALS — BP 127/82 | HR 84 | Temp 97.7°F | Resp 20 | Ht 70.0 in | Wt 210.8 lb

## 2020-03-18 DIAGNOSIS — I779 Disorder of arteries and arterioles, unspecified: Secondary | ICD-10-CM

## 2020-03-18 DIAGNOSIS — I739 Peripheral vascular disease, unspecified: Secondary | ICD-10-CM

## 2020-03-18 DIAGNOSIS — Z9889 Other specified postprocedural states: Secondary | ICD-10-CM

## 2020-03-18 NOTE — Progress Notes (Signed)
Office Note     CC:  follow up Requesting Provider:  Maximiano Coss, NP  HPI: Edward [name] is a 80 y.o. (10/04/1940) male who presents routine follow-up status post right carotid endarterectomy on March 2011 and left femoral to below-knee popliteal artery bypass with vein February 2014.  Wife accompanies him today.  He denies lower extremity claudication or rest pain.  He denies monocular blindness, extremity weakness or numbness, slurred speech, facial drooping.  His medical record reflects transient ischemic attack in 2012 that he is unclear of exact symptomology.  He is compliant with statin. He is on apixaban for atrial fibrillation.  Status post coronary artery bypass grafting. History of diabetes mellitus-insulin requiring Smoking in 1981   Past Medical History:  Diagnosis Date  . Anemia    ?  . Carotid artery occlusion   . Chronic diastolic CHF (congestive heart failure) ([place]) 04/23/2012  . CKD (chronic kidney disease), stage III   . Claudication ([place])   . Coronary artery disease    s/p CABG 2013  . Diabetes ([place])   . Diabetic neuropathy (HCC)    mostly feet/ legs  . Diverticulosis   . Dyslipidemia   . Exertional shortness of breath   . GERD (gastroesophageal reflux disease)   . GI bleed    while on triple anticoag therapy  . HTN (hypertension)   . Hyperlipidemia   . Neuromuscular disorder (HCC)    neuropathy  . Pacemaker   . PAF (paroxysmal atrial fibrillation) (HCC)    s/p MAZE at time of CABG  . PVD (peripheral vascular disease) ([place])    R CEA 2011, prior PTA, left fem-pop 2014  . Stroke ([place])    vs TIA  . Tubular adenoma of colon   . Type II diabetes mellitus ([place])     Past Surgical History:  Procedure Laterality Date  . ABDOMINAL AORTAGRAM N/A 01/07/2012   Procedure: ABDOMINAL Maxcine Ham;  Surgeon: Angelia Mould, MD;  Location: [facility] CATH LAB;  Service: Cardiovascular;  Laterality: N/A;  . BALLOON DILATION N/A 02/17/2013   Procedure: BALLOON  DILATION;  Surgeon: Jerene Bears, MD;  Location: [facility] ENDOSCOPY;  Service: Gastroenterology;  Laterality: N/A;  . CAROTID ENDARTERECTOMY Right 03/2009  . CATARACT EXTRACTION Bilateral   . CHOLECYSTECTOMY    . CIRCUMCISION    . COLONOSCOPY WITH PROPOFOL N/A 02/17/2013   Procedure: COLONOSCOPY WITH PROPOFOL;  Surgeon: Jerene Bears, MD;  Location: [facility] ENDOSCOPY;  Service: Gastroenterology;  Laterality: N/A;  . CORONARY ARTERY BYPASS GRAFT  06/14/2011   Procedure: CORONARY ARTERY BYPASS GRAFTING (CABG);  Surgeon: Gaye Pollack, MD;  Location: [place];  Service: Open Heart Surgery;  Laterality: N/A;  Coronary Artery Bypass Graft on pump times six;  utilizing internal mammary artery and right greater saphenous vein harvested endoscopically.x 5 vessels  . ELECTROPHYSIOLOGY STUDY N/A 11/05/2012   Procedure: ELECTROPHYSIOLOGY STUDY;  Surgeon: Deboraha Sprang, MD;  Location: [facility] CATH LAB;  Service: Cardiovascular;  Laterality: N/A;  . ESOPHAGOGASTRODUODENOSCOPY (EGD) WITH PROPOFOL N/A 02/17/2013   Procedure: ESOPHAGOGASTRODUODENOSCOPY (EGD) WITH PROPOFOL;  Surgeon: Jerene Bears, MD;  Location: [facility] ENDOSCOPY;  Service: Gastroenterology;  Laterality: N/A;  . FEMORAL-POPLITEAL BYPASS GRAFT Left 03/06/2012   Procedure: BYPASS GRAFT FEMORAL-POPLITEAL ARTERY;  Surgeon: Angelia Mould, MD;  Location[facility];  Service: Vascular;  Laterality: Left;  Left Femoral - Below Knee Popliteal Bypass Graft with Vein and Intraoperative Arteriogram.  . JOINT REPLACEMENT Left    left TKA  . KNEE ARTHROSCOPY  Left    "I had 3" (11/05/2012)  . LEAD REVISION  11/05/2012   pacemaker/notes 11/05/2012  . LEAD REVISION N/A 11/05/2012   Procedure: LEAD REVISION;  Surgeon: Deboraha Sprang, MD;  Location: [facility] CATH LAB;  Service: Cardiovascular;  Laterality: N/A;  . MAZE  06/14/2011   Procedure: MAZE;  Surgeon: Gaye Pollack, MD;  Location: [place];  Service: Open Heart Surgery;  Laterality: N/A;  L[place] left atrial appendage  . PERMANENT  PACEMAKER INSERTION N/A 06/20/2011   Procedure: PERMANENT PACEMAKER INSERTION;  Surgeon: Deboraha Sprang, MD;  Location: [facility] CATH LAB;  Service: Cardiovascular;  Laterality: N/A;  . TONSILLECTOMY AND ADENOIDECTOMY    . TOTAL KNEE ARTHROPLASTY Left     Social History   Socioeconomic History  . Marital status: Married    Spouse name: Not on file  . Number of children: Not on file  . Years of education: Not on file  . Highest education level: Not on file  Occupational History  . Not on file  Tobacco Use  . Smoking status: Former Smoker    Packs/day: 3.00    Years: 32.00    Pack years: 96.00    Types: Cigarettes    Quit date: 01/16/1979    Years since quitting: 41.1  . Smokeless tobacco: Never Used  Vaping Use  . Vaping Use: Never used  Substance and Sexual Activity  . Alcohol use: Yes    Alcohol/week: 2.0 standard drinks    Types: 2 Glasses of wine per week    Comment: beer 1-2 weekly (occasional)  . Drug use: No  . Sexual activity: Not Currently  Other Topics Concern  . Not on file  Social History Narrative  . Not on file   Social Determinants of Health   Financial Resource Strain: Not on file  Food Insecurity: Not on file  Transportation Needs: Not on file  Physical Activity: Not on file  Stress: Not on file  Social Connections: Not on file  Intimate Partner Violence: Not on file   Family History  Problem Relation Age of Onset  . Coronary artery disease Father   . Hypertension Father   . Heart disease Father        Heart Disease before age 67  . Heart attack Mother   . Coronary artery disease Mother   . Deep vein thrombosis Mother   . Heart disease Mother        Heart Disease before age 65  . Hyperlipidemia Mother   . Hypertension Mother   . Hypertension Sister   . Heart disease Sister        Heart Disease before age 32  . Hyperlipidemia Sister   . Heart disease Brother        Heart Disease before age 25  . Colon cancer Brother 60  . Anesthesia problems  Neg Hx   . Diabetes Neg Hx     Current Outpatient Medications  Medication Sig Dispense Refill  . acetaminophen (TYLENOL) 650 MG CR tablet Take 650 mg by mouth every 8 (eight) hours as needed for pain.    Marland Kitchen albuterol (PROVENTIL HFA;VENTOLIN HFA) 108 (90 Base) MCG/ACT inhaler USE 2 PUFFS EVERY 4 HOURS AS NEEDED FOR WHEEZING 8.5 Inhaler 0  . amoxicillin (AMOXIL) 500 MG capsule TAKE FOUR CAPSULES BY MOUTH ONCE 1 HOUR BEFORE DENTAL APPOINTMENT    . apixaban (ELIQUIS) 2.5 MG TABS tablet Take 1.25 mg by mouth 2 (two) times daily.     Marland Kitchen atorvastatin (  LIPITOR) 40 MG tablet TAKE ONE TABLET BY MOUTH AT BEDTIME FOR CHOLESTEROL. STOP ROSUVASTATIN    . blood glucose meter kit and supplies Dispense based on patient and insurance preference. Use up to four times daily as directed. (FOR ICD-10 E10.9, E11.9). 1 each 0  . cholecalciferol (VITAMIN D) 1000 units tablet Take 1,000 Units by mouth daily.    . Cyanocobalamin 500 MCG TBDP Take 1 tablet by mouth daily.    . ferrous sulfate 325 (65 FE) MG tablet Take 325 mg by mouth daily with breakfast.    . furosemide (LASIX) 40 MG tablet TAKE 2 TABLETS BY MOUTH EVERY MORNING AND TAKE 1 TABLET IN THE AFTERNOON 270 tablet 2  . gabapentin (NEURONTIN) 100 MG capsule Take 1 tablet every morning and 2 tablets by mouth every evening.    . insulin regular (NOVOLIN R,HUMULIN R) 100 units/mL injection Inject 30 Units into the skin 2 (two) times daily before a meal. As needed    . loratadine (CLARITIN) 10 MG tablet Take 10 mg by mouth daily.    . Multiple Vitamin (MULTIVITAMIN) capsule Take 1 capsule by mouth daily.    Marland Kitchen omeprazole (PRILOSEC) 20 MG capsule Take 20 mg by mouth 2 (two) times daily.    . Semaglutide, 1 MG/DOSE, 2 MG/1.5ML SOPN Inject 1 mg into the skin once a week.    . sildenafil (VIAGRA) 50 MG tablet May take 1-2 tablets as needed for sexual intercourse 30 tablet 2  . spironolactone (ALDACTONE) 25 MG tablet Take 0.5 tablets (12.5 mg total) by mouth daily.    Marland Kitchen  testosterone cypionate (DEPOTESTOSTERONE CYPIONATE) 200 MG/ML injection Inject 200 mg into the muscle every 14 (fourteen) days.    . valsartan (DIOVAN) 80 MG tablet Take 80 mg by mouth daily.    Marland Kitchen allopurinol (ZYLOPRIM) 100 MG tablet Take 100 mg by mouth daily. (Patient not taking: Reported on 03/18/2020)    . carvedilol (COREG) 6.25 MG tablet Take 1 tablet (6.25 mg total) by mouth 2 (two) times daily. (Patient not taking: Reported on 03/18/2020) 180 tablet 3  . MAGNESIUM PO Take 850 mg by mouth daily. (Patient not taking: Reported on 03/18/2020)    . Potassium 99 MG TABS Take 99 mg by mouth daily. (Patient not taking: Reported on 03/18/2020)    . rosuvastatin (CRESTOR) 20 MG tablet Take 20 mg by mouth daily. (Patient not taking: Reported on 03/18/2020)     No current facility-administered medications for this visit.    Allergies  Allergen Reactions  . Lipitor [Atorvastatin Calcium] Other (See Comments)    Reaction unknown     REVIEW OF SYSTEMS:   _0  denotes positive finding, _1  denotes negative finding Cardiac  Comments:  Chest pain or chest pressure:    Shortness of breath upon exertion:    Short of breath when lying flat:    Irregular heart rhythm:        Vascular    Pain in calf, thigh, or hip brought on by ambulation:    Pain in feet at night that wakes you up from your sleep:     Blood clot in your veins:    Leg swelling:         Pulmonary    Oxygen at home:    Productive cough:     Wheezing:         Neurologic    Sudden weakness in arms or legs:     Sudden numbness in arms or legs:  Sudden onset of difficulty speaking or slurred speech:    Temporary loss of vision in one eye:     Problems with dizziness:         Gastrointestinal    Blood in stool:     Vomited blood:         Genitourinary    Burning when urinating:     Blood in urine:        Psychiatric    Major depression:         Hematologic    Bleeding problems:    Problems with blood clotting too easily:         Skin    Rashes or ulcers:        Constitutional    Fever or chills:      PHYSICAL EXAMINATION:  Vitals:   03/18/20 1436  Resp: 20  Weight: 210 lb 12.8 oz (95.6 kg)  Height: _0  (1.778 m)    General:  WDWN in NAD; vital signs documented above Gait: Unaided, no ataxia HENT: WNL, normocephalic Pulmonary: normal non-labored breathing , without Rales, rhonchi,  wheezing Cardiac: regular HR, without  Murmurs without carotid bruit Abdomen: soft, NT, no masses Skin: without rashes Vascular Exam/Pulses: 2+ radial, femoral laterally and left dorsalis pedis pulse.  I cannot palpate right pedal pulses. Extremities: without ischemic changes, without Gangrene , without cellulitis; without open wounds; very dry skin of the left heel with fissure (see photo).  Both feet are warm and well perfused with intact motor function and sensation. Musculoskeletal: no muscle wasting or atrophy  Neurologic: A&O X 3;  No focal weakness or paresthesias are detected Psychiatric:  The pt has Normal affect.      Non-Invasive Vascular Imaging:   03/18/2020 Right Carotid: Patent carotid endarterectomy. Velocities in the right ICA  are consistent with a 1-39% stenosis.   Left Carotid: Velocities in the left ICA are consistent with a 1-39% stenosis.   Vertebrals: Bilateral vertebral arteries demonstrate antegrade flow.  Limited visualization of the bilateral vertebral arteries.  Arteries with multiphasic waveforms  LLE arterial duplex: Left: Patent left femoropopliteal bypass graft with no evidence of  restenosis.  Waveforms are biphasic and triphasic.  ABIs: ABI/TBIToday's ABIToday's TBIPrevious ABIPrevious TBI  +-------+-----------+-----------+------------+------------+  Right 0.99    0.86    1.19    0.98      +-------+-----------+-----------+------------+------------+  Left  1.16    0.71    1.13    1.02       +-------+-----------+-----------+------------+------------+    Right great toe pressure is 120.  Waveforms are triphasic Left great toe pressure is 98 waveforms are triphasic.  ASSESSMENT/PLAN:: 80 y.o. male here for follow up for carotid artery stenosis status post right CEA and peripheral arterial disease status post left femoral to below-knee popliteal artery bypass with vein.  Bypass graft is patent. He has no symptoms referable to peripheral arterial disease.  His ABIs and toe pressures are preserved.  Follow-up in 1 year with repeat ABIs and left lower extremity arterial duplex. He does have fissuring and cracked dry skin of his left medial heel.  I explained he needs to watch this carefully and to call us should this area worsen.  Advised him regarding dry skin care with a high-quality emollient cream. Carotid duplex is stable without progression of stenosis.  Reviewed signs and symptoms of stroke/TIA and I encouraged him to call EMS should these occur.  Follow-up in 1 year with carotid duplex    Edman Circle  Vaughan Basta, PA-C [facility] [phone]  Clinic MD:   Dr. Stanford Breed on-call

## 2020-09-02 ENCOUNTER — Other Ambulatory Visit: Payer: Self-pay | Admitting: Neurological Surgery

## 2020-09-02 DIAGNOSIS — D496 Neoplasm of unspecified behavior of brain: Secondary | ICD-10-CM

## 2020-09-30 ENCOUNTER — Ambulatory Visit
Admission: RE | Admit: 2020-09-30 | Discharge: 2020-09-30 | Disposition: A | Discharge status: Home / Self Care | Payer: Federal, State, Local not specified - PPO | Source: Ambulatory Visit | Attending: Neurological Surgery | Admitting: Neurological Surgery

## 2020-09-30 ENCOUNTER — Other Ambulatory Visit: Payer: Self-pay

## 2020-09-30 DIAGNOSIS — D496 Neoplasm of unspecified behavior of brain: Secondary | ICD-10-CM

## 2020-10-13 DIAGNOSIS — Z6831 Body mass index (BMI) 31.0-31.9, adult: Secondary | ICD-10-CM | POA: Insufficient documentation

## 2020-11-30 ENCOUNTER — Other Ambulatory Visit: Payer: Self-pay

## 2020-11-30 DIAGNOSIS — R600 Localized edema: Secondary | ICD-10-CM

## 2020-12-28 ENCOUNTER — Ambulatory Visit: Payer: Non-veteran care

## 2020-12-28 ENCOUNTER — Encounter (HOSPITAL_COMMUNITY): Payer: Non-veteran care

## 2021-01-26 ENCOUNTER — Ambulatory Visit: Payer: Non-veteran care

## 2021-01-26 ENCOUNTER — Encounter (HOSPITAL_COMMUNITY): Payer: Non-veteran care

## 2021-01-27 ENCOUNTER — Other Ambulatory Visit: Payer: Self-pay

## 2021-01-27 ENCOUNTER — Other Ambulatory Visit: Payer: Self-pay | Admitting: *Deleted

## 2021-01-27 ENCOUNTER — Ambulatory Visit (HOSPITAL_COMMUNITY)
Admission: RE | Admit: 2021-01-27 | Discharge: 2021-01-27 | Disposition: A | Discharge status: Home / Self Care | Payer: No Typology Code available for payment source | Source: Ambulatory Visit | Attending: Vascular Surgery | Admitting: Vascular Surgery

## 2021-01-27 DIAGNOSIS — R600 Localized edema: Secondary | ICD-10-CM

## 2021-01-31 ENCOUNTER — Other Ambulatory Visit: Payer: Self-pay

## 2021-01-31 DIAGNOSIS — Z9889 Other specified postprocedural states: Secondary | ICD-10-CM

## 2021-01-31 DIAGNOSIS — I739 Peripheral vascular disease, unspecified: Secondary | ICD-10-CM

## 2021-02-01 ENCOUNTER — Ambulatory Visit (INDEPENDENT_AMBULATORY_CARE_PROVIDER_SITE_OTHER)
Admission: RE | Admit: 2021-02-01 | Discharge: 2021-02-01 | Disposition: A | Discharge status: Home / Self Care | Payer: No Typology Code available for payment source | Source: Ambulatory Visit | Attending: Vascular Surgery | Admitting: Vascular Surgery

## 2021-02-01 ENCOUNTER — Ambulatory Visit (INDEPENDENT_AMBULATORY_CARE_PROVIDER_SITE_OTHER): Payer: Federal, State, Local not specified - PPO | Admitting: Physician Assistant

## 2021-02-01 ENCOUNTER — Other Ambulatory Visit: Payer: Self-pay

## 2021-02-01 ENCOUNTER — Ambulatory Visit (HOSPITAL_COMMUNITY)
Admission: RE | Admit: 2021-02-01 | Discharge: 2021-02-01 | Disposition: A | Discharge status: Home / Self Care | Payer: No Typology Code available for payment source | Source: Ambulatory Visit | Attending: Physician Assistant | Admitting: Physician Assistant

## 2021-02-01 VITALS — BP 136/73 | HR 83 | Temp 98.6°F | Resp 20 | Ht 70.0 in | Wt 240.1 lb

## 2021-02-01 DIAGNOSIS — Z9889 Other specified postprocedural states: Secondary | ICD-10-CM | POA: Diagnosis not present

## 2021-02-01 DIAGNOSIS — H04123 Dry eye syndrome of bilateral lacrimal glands: Secondary | ICD-10-CM | POA: Insufficient documentation

## 2021-02-01 DIAGNOSIS — M519 Unspecified thoracic, thoracolumbar and lumbosacral intervertebral disc disorder: Secondary | ICD-10-CM | POA: Insufficient documentation

## 2021-02-01 DIAGNOSIS — R600 Localized edema: Secondary | ICD-10-CM

## 2021-02-01 DIAGNOSIS — M109 Gout, unspecified: Secondary | ICD-10-CM | POA: Insufficient documentation

## 2021-02-01 DIAGNOSIS — Z7901 Long term (current) use of anticoagulants: Secondary | ICD-10-CM | POA: Insufficient documentation

## 2021-02-01 DIAGNOSIS — E861 Hypovolemia: Secondary | ICD-10-CM | POA: Insufficient documentation

## 2021-02-01 DIAGNOSIS — N184 Chronic kidney disease, stage 4 (severe): Secondary | ICD-10-CM | POA: Insufficient documentation

## 2021-02-01 DIAGNOSIS — D72829 Elevated white blood cell count, unspecified: Secondary | ICD-10-CM | POA: Insufficient documentation

## 2021-02-01 DIAGNOSIS — M1711 Unilateral primary osteoarthritis, right knee: Secondary | ICD-10-CM | POA: Insufficient documentation

## 2021-02-01 DIAGNOSIS — S8002XA Contusion of left knee, initial encounter: Secondary | ICD-10-CM | POA: Insufficient documentation

## 2021-02-01 DIAGNOSIS — E291 Testicular hypofunction: Secondary | ICD-10-CM | POA: Insufficient documentation

## 2021-02-01 DIAGNOSIS — G629 Polyneuropathy, unspecified: Secondary | ICD-10-CM | POA: Insufficient documentation

## 2021-02-01 DIAGNOSIS — I739 Peripheral vascular disease, unspecified: Secondary | ICD-10-CM

## 2021-02-01 DIAGNOSIS — I872 Venous insufficiency (chronic) (peripheral): Secondary | ICD-10-CM | POA: Insufficient documentation

## 2021-02-01 DIAGNOSIS — Z461 Encounter for fitting and adjustment of hearing aid: Secondary | ICD-10-CM | POA: Insufficient documentation

## 2021-02-01 DIAGNOSIS — S2232XA Fracture of one rib, left side, initial encounter for closed fracture: Secondary | ICD-10-CM | POA: Insufficient documentation

## 2021-02-01 DIAGNOSIS — Z800 Family history of malignant neoplasm of digestive organs: Secondary | ICD-10-CM | POA: Insufficient documentation

## 2021-02-01 DIAGNOSIS — J00 Acute nasopharyngitis [common cold]: Secondary | ICD-10-CM | POA: Insufficient documentation

## 2021-02-01 DIAGNOSIS — R251 Tremor, unspecified: Secondary | ICD-10-CM | POA: Insufficient documentation

## 2021-02-01 DIAGNOSIS — I130 Hypertensive heart and chronic kidney disease with heart failure and stage 1 through stage 4 chronic kidney disease, or unspecified chronic kidney disease: Secondary | ICD-10-CM | POA: Insufficient documentation

## 2021-02-01 DIAGNOSIS — I890 Lymphedema, not elsewhere classified: Secondary | ICD-10-CM

## 2021-02-01 DIAGNOSIS — H524 Presbyopia: Secondary | ICD-10-CM | POA: Insufficient documentation

## 2021-02-01 DIAGNOSIS — L0232 Furuncle of buttock: Secondary | ICD-10-CM | POA: Insufficient documentation

## 2021-02-01 DIAGNOSIS — Z96659 Presence of unspecified artificial knee joint: Secondary | ICD-10-CM | POA: Insufficient documentation

## 2021-02-01 DIAGNOSIS — J019 Acute sinusitis, unspecified: Secondary | ICD-10-CM | POA: Insufficient documentation

## 2021-02-01 DIAGNOSIS — C4442 Squamous cell carcinoma of skin of scalp and neck: Secondary | ICD-10-CM | POA: Insufficient documentation

## 2021-02-01 DIAGNOSIS — H903 Sensorineural hearing loss, bilateral: Secondary | ICD-10-CM | POA: Insufficient documentation

## 2021-02-01 DIAGNOSIS — N1832 Chronic kidney disease, stage 3b: Secondary | ICD-10-CM | POA: Insufficient documentation

## 2021-02-01 DIAGNOSIS — N179 Acute kidney failure, unspecified: Secondary | ICD-10-CM | POA: Insufficient documentation

## 2021-02-01 DIAGNOSIS — E1165 Type 2 diabetes mellitus with hyperglycemia: Secondary | ICD-10-CM | POA: Insufficient documentation

## 2021-02-01 DIAGNOSIS — Z872 Personal history of diseases of the skin and subcutaneous tissue: Secondary | ICD-10-CM | POA: Insufficient documentation

## 2021-02-01 DIAGNOSIS — M25532 Pain in left wrist: Secondary | ICD-10-CM | POA: Insufficient documentation

## 2021-02-01 HISTORY — DX: Fracture of one rib, left side, initial encounter for closed fracture: S22.32XA

## 2021-02-02 NOTE — Progress Notes (Signed)
Office Note     CC:  follow up Requesting Provider:  Maximiano Coss, NP  HPI: Edward [name] is a 81 y.o. ([birth year]) male who presents for evaluation of left lower extremity edema and pain.  Surgical history significant for left femoral to popliteal bypass with vein in February 2014 by Dr. Scot Dock.  He states over the past several weeks he has developed worsening edema in his left leg.  The pain is severe enough to keep him from walking or putting any weight on it.  He is not wearing compression.  He states his leg feels much better in the mornings and is much smaller size.  He underwent left lower extremity venous reflux study last week which was negative for DVT.  Study was also negative for deep venous reflux.  He denies any cellulitic rash, fever, chills, nausea/vomiting.  Past medical history also significant for stage III chronic kidney disease as well as congestive heart failure.  He is scheduled for cardiac catheterization at the end of the month.  He is on Eliquis for atrial fibrillation.   Past Medical History:  Diagnosis Date   Anemia    ?   Carotid artery occlusion    Chronic diastolic CHF (congestive heart failure) ([place]) 04/23/2012   CKD (chronic kidney disease), stage III (HCC)    Claudication (HCC)    Coronary artery disease    s/p CABG 2013   Diabetes ([place])    Diabetic neuropathy ([place])    mostly feet/ legs   Diverticulosis    Dyslipidemia    Exertional shortness of breath    GERD (gastroesophageal reflux disease)    GI bleed    while on triple anticoag therapy   HTN (hypertension)    Hyperlipidemia    Neuromuscular disorder (HCC)    neuropathy   Pacemaker    PAF (paroxysmal atrial fibrillation) ([place])    s/p MAZE at time of CABG   PVD (peripheral vascular disease) ([place])    R CEA 2011, prior PTA, left fem-pop 2014   Stroke ([place])    vs TIA   Tubular adenoma of colon    Type II diabetes mellitus ([place])     Past Surgical History:  Procedure Laterality Date    ABDOMINAL AORTAGRAM N/A 01/07/2012   Procedure: ABDOMINAL Maxcine Ham;  Surgeon: Angelia Mould, MD;  Location: [facility] CATH LAB;  Service: Cardiovascular;  Laterality: N/A;   BALLOON DILATION N/A 02/17/2013   Procedure: BALLOON DILATION;  Surgeon: Jerene Bears, MD;  Location: [facility] ENDOSCOPY;  Service: Gastroenterology;  Laterality: N/A;   CAROTID ENDARTERECTOMY Right 03/2009   CATARACT EXTRACTION Bilateral    CHOLECYSTECTOMY     CIRCUMCISION     COLONOSCOPY WITH PROPOFOL N/A 02/17/2013   Procedure: COLONOSCOPY WITH PROPOFOL;  Surgeon: Jerene Bears, MD;  Location: [facility];  Service: Gastroenterology;  Laterality: N/A;   CORONARY ARTERY BYPASS GRAFT  06/14/2011   Procedure: CORONARY ARTERY BYPASS GRAFTING (CABG);  Surgeon: Gaye Pollack, MD;  Location: [place];  Service: Open Heart Surgery;  Laterality: N/A;  Coronary Artery Bypass Graft on pump times six;  utilizing internal mammary artery and right greater saphenous vein harvested endoscopically.x 5 vessels   ELECTROPHYSIOLOGY STUDY N/A 11/05/2012   Procedure: ELECTROPHYSIOLOGY STUDY;  Surgeon: Deboraha Sprang, MD;  Location: [facility] CATH LAB;  Service: Cardiovascular;  Laterality: N/A;   ESOPHAGOGASTRODUODENOSCOPY (EGD) WITH PROPOFOL N/A 02/17/2013   Procedure: ESOPHAGOGASTRODUODENOSCOPY (EGD) WITH PROPOFOL;  Surgeon: Jerene Bears, MD;  [facility]: [facility];  Service: Gastroenterology;  Laterality: N/A;   FEMORAL-POPLITEAL BYPASS GRAFT Left 03/06/2012   Procedure: BYPASS GRAFT FEMORAL-POPLITEAL ARTERY;  Surgeon: Angelia Mould, MD;  Location: [place];  Service: Vascular;  Laterality: Left;  Left Femoral - Below Knee Popliteal Bypass Graft with Vein and Intraoperative Arteriogram.   JOINT REPLACEMENT Left    left TKA   KNEE ARTHROSCOPY Left    "I had 3" (11/05/2012)   LEAD REVISION  11/05/2012   pacemaker/notes 11/05/2012   LEAD REVISION N/A 11/05/2012   Procedure: LEAD REVISION;  Surgeon: Deboraha Sprang, MD;  Location: [facility] CATH LAB;  Service:  Cardiovascular;  Laterality: N/A;   MAZE  06/14/2011   Procedure: MAZE;  Surgeon: Gaye Pollack, MD;  Location: [place];  Service: Open Heart Surgery;  Laterality: N/A;  Ligate left atrial appendage   PERMANENT PACEMAKER INSERTION N/A 06/20/2011   Procedure: PERMANENT PACEMAKER INSERTION;  Surgeon: Deboraha Sprang, MD;  Location: [facility] CATH LAB;  Service: Cardiovascular;  Laterality: N/A;   TONSILLECTOMY AND ADENOIDECTOMY     TOTAL KNEE ARTHROPLASTY Left     Social History   Socioeconomic History   Marital status: Married    Spouse name: Not on file   Number of children: Not on file   Years of education: Not on file   Highest education level: Not on file  Occupational History   Not on file  Tobacco Use   Smoking status: Former    Packs/day: 3.00    Years: 32.00    Pack years: 96.00    Types: Cigarettes    Quit date: 01/16/1979    Years since quitting: 42.0   Smokeless tobacco: Never  Vaping Use   Vaping Use: Never used  Substance and Sexual Activity   Alcohol use: Yes    Alcohol/week: 2.0 standard drinks    Types: 2 Glasses of wine per week    Comment: beer 1-2 weekly (occasional)   Drug use: No   Sexual activity: Not Currently  Other Topics Concern   Not on file  Social History Narrative   Not on file   Social Determinants of Health   Financial Resource Strain: Not on file  Food Insecurity: Not on file  Transportation Needs: Not on file  Physical Activity: Not on file  Stress: Not on file  Social Connections: Not on file  Intimate Partner Violence: Not on file    Family History  Problem Relation Age of Onset   Coronary artery disease Father    Hypertension Father    Heart disease Father        Heart Disease before age 63   Heart attack Mother    Coronary artery disease Mother    Deep vein thrombosis Mother    Heart disease Mother        Heart Disease before age 31   Hyperlipidemia Mother    Hypertension Mother    Hypertension Sister    Heart disease Sister         Heart Disease before age 7   Hyperlipidemia Sister    Heart disease Brother        Heart Disease before age 7   Colon cancer Brother 60   Anesthesia problems Neg Hx    Diabetes Neg Hx     Current Outpatient Medications  Medication Sig Dispense Refill   acetaminophen (TYLENOL) 650 MG CR tablet Take 650 mg by mouth every 8 (eight) hours as needed for pain.     albuterol (PROVENTIL HFA;VENTOLIN  HFA) 108 (90 Base) MCG/ACT inhaler USE 2 PUFFS EVERY 4 HOURS AS NEEDED FOR WHEEZING 8.5 Inhaler 0   allopurinol (ZYLOPRIM) 100 MG tablet Take 100 mg by mouth daily.     apixaban (ELIQUIS) 2.5 MG TABS tablet Take 1.25 mg by mouth 2 (two) times daily.      atorvastatin (LIPITOR) 40 MG tablet TAKE ONE TABLET BY MOUTH AT BEDTIME FOR CHOLESTEROL. STOP ROSUVASTATIN     blood glucose meter kit and supplies Dispense based on patient and insurance preference. Use up to four times daily as directed. (FOR ICD-10 E10.9, E11.9). 1 each 0   carvedilol (COREG) 6.25 MG tablet Take 1 tablet (6.25 mg total) by mouth 2 (two) times daily. 180 tablet 3   cholecalciferol (VITAMIN D) 1000 units tablet Take 1,000 Units by mouth daily.     Cyanocobalamin 500 MCG TBDP Take 1 tablet by mouth daily.     ferrous sulfate 325 (65 FE) MG tablet Take 325 mg by mouth daily with breakfast.     furosemide (LASIX) 40 MG tablet TAKE 2 TABLETS BY MOUTH EVERY MORNING AND TAKE 1 TABLET IN THE AFTERNOON 270 tablet 2   gabapentin (NEURONTIN) 100 MG capsule Take 1 tablet every morning and 2 tablets by mouth every evening.     insulin regular (NOVOLIN R,HUMULIN R) 100 units/mL injection Inject 30 Units into the skin 2 (two) times daily before a meal. As needed     loratadine (CLARITIN) 10 MG tablet Take 10 mg by mouth daily.     MAGNESIUM PO Take 850 mg by mouth daily.     Multiple Vitamin (MULTIVITAMIN) capsule Take 1 capsule by mouth daily.     omeprazole (PRILOSEC) 20 MG capsule Take 20 mg by mouth 2 (two) times daily.     Potassium  99 MG TABS Take 99 mg by mouth daily.     rosuvastatin (CRESTOR) 20 MG tablet Take 20 mg by mouth daily.     Semaglutide, 1 MG/DOSE, 2 MG/1.5ML SOPN Inject 1 mg into the skin once a week.     sildenafil (VIAGRA) 50 MG tablet May take 1-2 tablets as needed for sexual intercourse 30 tablet 2   spironolactone (ALDACTONE) 25 MG tablet Take 0.5 tablets (12.5 mg total) by mouth daily.     testosterone cypionate (DEPOTESTOSTERONE CYPIONATE) 200 MG/ML injection Inject 200 mg into the muscle every 14 (fourteen) days.     valsartan (DIOVAN) 80 MG tablet Take 80 mg by mouth daily.     No current facility-administered medications for this visit.    Allergies  Allergen Reactions   Lipitor [Atorvastatin Calcium] Other (See Comments)    Reaction unknown     REVIEW OF SYSTEMS:   [X] denotes positive finding, [ ] denotes negative finding Cardiac  Comments:  Chest pain or chest pressure:    Shortness of breath upon exertion:    Short of breath when lying flat:    Irregular heart rhythm:        Vascular    Pain in calf, thigh, or hip brought on by ambulation:    Pain in feet at night that wakes you up from your sleep:     Blood clot in your veins:    Leg swelling:         Pulmonary    Oxygen at home:    Productive cough:     Wheezing:         Neurologic    Sudden weakness in arms or legs:  Sudden numbness in arms or legs:     Sudden onset of difficulty speaking or slurred speech:    Temporary loss of vision in one eye:     Problems with dizziness:         Gastrointestinal    Blood in stool:     Vomited blood:         Genitourinary    Burning when urinating:     Blood in urine:        Psychiatric    Major depression:         Hematologic    Bleeding problems:    Problems with blood clotting too easily:        Skin    Rashes or ulcers:        Constitutional    Fever or chills:      PHYSICAL EXAMINATION:  Vitals:   02/01/21 1530  BP: 136/73  Pulse: 83  Resp: 20   Temp: 98.6 F (37 C)  TempSrc: Temporal  SpO2: 98%  Weight: 240 lb 1.6 oz (108.9 kg)  Height: 5' 10" (1.778 m)    General:  WDWN in NAD; vital signs documented above Gait: Not observed HENT: WNL, normocephalic Pulmonary: normal non-labored breathing , without Rales, rhonchi,  wheezing Cardiac: regular HR Abdomen: soft, NT, no masses Skin: without rashes Extremities: R LE warm and well perfused; L lower leg and foot edema with stasis pigmentation and cracking skin; no open wounds or cellulitis rash Musculoskeletal: no muscle wasting or atrophy  Neurologic: A&O X 3;  No focal weakness or paresthesias are detected Psychiatric:  The pt has Normal affect.   Non-Invasive Vascular Imaging:   Widely patent left femoral-popliteal bypass graft ABI/TBI Today's ABI Today's TBI Previous ABI Previous TBI   +-------+-----------+-----------+------------+------------+   Right   0.96        0.84        0.99         0.86           +-------+-----------+-----------+------------+------------+   Left    1.02        0.8         1.16         0.71         Left lower extremity venous reflux study on 1/13 is negative for DVT, negative for deep venous reflux, left greater saphenous vein not visualized due to previous harvest for bypass surgery   ASSESSMENT/PLAN:: 81 y.o. male here for evaluation of left lower extremity edema and pain  -Subjectively pain and edema of left leg has worsened over the past several weeks to months -Left lower extremity venous reflux study was negative for DVT last week.  Left femoral to popliteal bypass also widely patent based on imaging studies today.  Given patient's description of pain worsening as the day goes on as well as resolution of edema overnight, this is likely related to venous insufficiency.  He also looks to have a component of lymphedema based on the swelling pattern of his lower leg and foot.  Dr. Scot Dock also evaluated the patient with me today and gave the  following recommendations: Ace wrap should be applied from the toes to the knee on a daily basis starting in the morning.  Ace wrap should be tighter at the foot.  Patient also asked to periodically elevate his legs above his heart during the day and this was demonstrated.  He should also increase activity level with exercise as well as  follow a healthy diet.  Finally patient should avoid prolonged sitting and standing.  Plan is for patient to follow-up in 6 months with repeat bypass surveillance.  He will call/return office sooner if he develops venous ulcerations or weeping wounds of left leg   Dagoberto Ligas, PA-C [facility] [phone]  Clinic MD:   Scot Dock

## 2021-02-06 ENCOUNTER — Ambulatory Visit: Payer: Federal, State, Local not specified - PPO | Admitting: Podiatry

## 2021-02-06 ENCOUNTER — Ambulatory Visit (INDEPENDENT_AMBULATORY_CARE_PROVIDER_SITE_OTHER): Payer: Federal, State, Local not specified - PPO

## 2021-02-06 ENCOUNTER — Other Ambulatory Visit: Payer: Self-pay

## 2021-02-06 DIAGNOSIS — M14672 Charcot's joint, left ankle and foot: Secondary | ICD-10-CM | POA: Diagnosis not present

## 2021-02-06 DIAGNOSIS — E0843 Diabetes mellitus due to underlying condition with diabetic autonomic (poly)neuropathy: Secondary | ICD-10-CM

## 2021-02-06 NOTE — Progress Notes (Signed)
HPI: 81 y.o. male  PMHx diabetes mellitus, CKD stage III, CHF, PVD presenting for worsening left lower extremity and foot pain.  Patient states that it has been extremely painful for the last few months.  He has not had it evaluated yet.  He presents for further treatment and evaluation  Past Medical History:  Diagnosis Date   Anemia    ?   Carotid artery occlusion    Chronic diastolic CHF (congestive heart failure) ([place]) 04/23/2012   CKD (chronic kidney disease), stage III (HCC)    Claudication (HCC)    Coronary artery disease    s/p CABG 2013   Diabetes ([place])    Diabetic neuropathy ([place])    mostly feet/ legs   Diverticulosis    Dyslipidemia    Exertional shortness of breath    GERD (gastroesophageal reflux disease)    GI bleed    while on triple anticoag therapy   HTN (hypertension)    Hyperlipidemia    Neuromuscular disorder (HCC)    neuropathy   Pacemaker    PAF (paroxysmal atrial fibrillation) ([place])    s/p MAZE at time of CABG   PVD (peripheral vascular disease) ([place])    R CEA 2011, prior PTA, left fem-pop 2014   Stroke ([place])    vs TIA   Tubular adenoma of colon    Type II diabetes mellitus ([place])     Past Surgical History:  Procedure Laterality Date   ABDOMINAL AORTAGRAM N/A 01/07/2012   Procedure: ABDOMINAL Maxcine Ham;  Surgeon: Angelia Mould, MD;  Location: [facility] CATH LAB;  Service: Cardiovascular;  Laterality: N/A;   BALLOON DILATION N/A 02/17/2013   Procedure: BALLOON DILATION;  Surgeon: Jerene Bears, MD;  [facility];  Service: Gastroenterology;  Laterality: N/A;   CAROTID ENDARTERECTOMY Right 03/2009   CATARACT EXTRACTION Bilateral    CHOLECYSTECTOMY     CIRCUMCISION     COLONOSCOPY WITH PROPOFOL N/A 02/17/2013   Procedure: COLONOSCOPY WITH PROPOFOL;  Surgeon: Jerene Bears, MD;  [facility]: [facility];  Service: Gastroenterology;  Laterality: N/A;   CORONARY ARTERY BYPASS GRAFT  06/14/2011   Procedure: CORONARY ARTERY BYPASS GRAFTING (CABG);   Surgeon: Gaye Pollack, MD;  Location: [place];  Service: Open Heart Surgery;  Laterality: N/A;  Coronary Artery Bypass Graft on pump times six;  utilizing internal mammary artery and right greater saphenous vein harvested endoscopically.x 5 vessels   ELECTROPHYSIOLOGY STUDY N/A 11/05/2012   Procedure: ELECTROPHYSIOLOGY STUDY;  Surgeon: Deboraha Sprang, MD;  Location: [facility] CATH LAB;  Service: Cardiovascular;  Laterality: N/A;   ESOPHAGOGASTRODUODENOSCOPY (EGD) WITH PROPOFOL N/A 02/17/2013   Procedure: ESOPHAGOGASTRODUODENOSCOPY (EGD) WITH PROPOFOL;  Surgeon: Jerene Bears, MD;  [facility];  Service: Gastroenterology;  Laterality: N/A;   FEMORAL-POPLITEAL BYPASS GRAFT Left 03/06/2012   Procedure: BYPASS GRAFT FEMORAL-POPLITEAL ARTERY;  Surgeon: Angelia Mould, MD;  Location: [place];  Service: Vascular;  Laterality: Left;  Left Femoral - Below Knee Popliteal Bypass Graft with Vein and Intraoperative Arteriogram.   JOINT REPLACEMENT Left    left TKA   KNEE ARTHROSCOPY Left    "I had 3" (11/05/2012)   LEAD REVISION  11/05/2012   pacemaker/notes 11/05/2012   LEAD REVISION N/A 11/05/2012   Procedure: LEAD REVISION;  Surgeon: Deboraha Sprang, MD;  Location: [facility] CATH LAB;  Service: Cardiovascular;  Laterality: N/A;   MAZE  06/14/2011   Procedure: MAZE;  Surgeon: Gaye Pollack, MD;  Location: [place];  Service: Open Heart Surgery;  Laterality: N/A;  Ligate left atrial appendage   PERMANENT PACEMAKER INSERTION N/A 06/20/2011   Procedure: PERMANENT PACEMAKER INSERTION;  Surgeon: Deboraha Sprang, MD;  Location: [facility] CATH LAB;  Service: Cardiovascular;  Laterality: N/A;   TONSILLECTOMY AND ADENOIDECTOMY     TOTAL KNEE ARTHROPLASTY Left     Allergies  Allergen Reactions   Lipitor [Atorvastatin Calcium] Other (See Comments)    Reaction unknown     Physical Exam: General: The patient is alert and oriented x3 in no acute distress.  Dermatology: Skin is warm, dry and supple bilateral lower  extremities. Negative for open lesions or macerations.  Vascular: Known peripheral vascular disease.  Skin is slightly warm to touch  Neurological: Light touch and protective threshold diminished  Musculoskeletal Exam: No pedal deformities noted at the moment with clinical exam.  The patient maintains somewhat of an arc to the foot.  No bony prominence or wounds noted  Radiographic Exam:  Osseous erosion with fragmentation and collapse of the midtarsal joint of the foot.  Fracture fragment of the medial cuneiform with subluxation/dislocation throughout the midtarsal joint consistent with findings of Charcot neuroarthropathy  Assessment: 1.  Charcot neuroarthropathy left 2.  Diabetes mellitus with peripheral polyneuropathy 3.  Peripheral vascular disease  Plan of Care:  1. Patient evaluated. X-Rays reviewed.  2.  Explained the etiology and pathology regarding Charcot neuroarthropathy.  The patient is minimally ambulatory at the moment.  It would be unrealistic for the patient to be completely nonweightbearing to the foot.  He has a hard time ambulating with a walker as it is. 3.  The patient refused a cam boot.  He says he has a cam boot at home but he does not want to wear it 4.  Prescription for tramadol 50 mg every 4 hours as needed pain 5.  I did discuss with the patient and his spouse the severity and seriousness regarding Charcot which could lead to amputation however we are going to try and avoid this is much as reasonably possible.  The patient is not a surgical candidate for Charcot reconstruction. 6.  Multilayer Unna boot compression wrap was applied to the patient's left lower extremity.  Remain intact x5 days and then he may remove it and resume the compression stockings that the patient has at home 7.  Return to clinic in 3 months for follow-up x-ray.  In the meantime minimal ambulating and weightbearing is much as possible for the patient      Edrick Kins, DPM [facility]  Dr. Edrick Kins, DPM    [address], [place] [address]                Office [phone]  Fax [phone]

## 2021-02-07 ENCOUNTER — Telehealth: Payer: Self-pay | Admitting: Podiatry

## 2021-02-07 NOTE — Telephone Encounter (Signed)
Patient called this morning , he does not want to have in Home care to come out and do the wrappings. Please call them and cancel services. ( He did not know the name of the company of the people you were sending out.)

## 2021-03-08 ENCOUNTER — Telehealth (HOSPITAL_COMMUNITY): Payer: Self-pay | Admitting: *Deleted

## 2021-03-08 NOTE — Telephone Encounter (Signed)
Received referral notification from Dr. Duke Salvia at [facility] for this pt to participate in Cardiac rehab s/p 02/11/20 DES to [facility] at [facility].  Called and spoke to pt wife, Mimi who is listed on the DPR, pt asleep taking a nap.  Mimi who is familiar with our exercise program from previous participation felt Alekzander who has limited mobility would be unable to participate in a group setting and would do better with one on one.  He is on a walker/scouter due to broken bones in his feet that "can not be fixed" and he is unable to bear weight and requires much assistance.  Reminded that due to the coronary stent he is eligible but does not have to participate if it is felt he requires level of care we can not provide.  Have upcoming appt in a couple of weeks at the [place].  To discuss this with the provider.  Cherre Huger, BSN Cardiac and Training and development officer

## 2021-03-16 ENCOUNTER — Emergency Department (HOSPITAL_COMMUNITY): Payer: No Typology Code available for payment source

## 2021-03-16 ENCOUNTER — Inpatient Hospital Stay (HOSPITAL_COMMUNITY)
Admission: EM | Admit: 2021-03-16 | Discharge: 2021-03-19 | DRG: 378 | Disposition: A | Discharge status: Home / Self Care | Payer: No Typology Code available for payment source | Attending: Internal Medicine | Admitting: Internal Medicine

## 2021-03-16 ENCOUNTER — Other Ambulatory Visit: Payer: Self-pay

## 2021-03-16 DIAGNOSIS — I10 Essential (primary) hypertension: Secondary | ICD-10-CM | POA: Diagnosis present

## 2021-03-16 DIAGNOSIS — Z87891 Personal history of nicotine dependence: Secondary | ICD-10-CM

## 2021-03-16 DIAGNOSIS — R195 Other fecal abnormalities: Secondary | ICD-10-CM | POA: Diagnosis not present

## 2021-03-16 DIAGNOSIS — K2970 Gastritis, unspecified, without bleeding: Secondary | ICD-10-CM

## 2021-03-16 DIAGNOSIS — I5032 Chronic diastolic (congestive) heart failure: Secondary | ICD-10-CM | POA: Diagnosis present

## 2021-03-16 DIAGNOSIS — N184 Chronic kidney disease, stage 4 (severe): Secondary | ICD-10-CM | POA: Diagnosis present

## 2021-03-16 DIAGNOSIS — Z8673 Personal history of transient ischemic attack (TIA), and cerebral infarction without residual deficits: Secondary | ICD-10-CM

## 2021-03-16 DIAGNOSIS — N1832 Chronic kidney disease, stage 3b: Secondary | ICD-10-CM | POA: Diagnosis present

## 2021-03-16 DIAGNOSIS — Z8349 Family history of other endocrine, nutritional and metabolic diseases: Secondary | ICD-10-CM

## 2021-03-16 DIAGNOSIS — Z955 Presence of coronary angioplasty implant and graft: Secondary | ICD-10-CM

## 2021-03-16 DIAGNOSIS — E1122 Type 2 diabetes mellitus with diabetic chronic kidney disease: Secondary | ICD-10-CM | POA: Diagnosis present

## 2021-03-16 DIAGNOSIS — D649 Anemia, unspecified: Secondary | ICD-10-CM

## 2021-03-16 DIAGNOSIS — Z888 Allergy status to other drugs, medicaments and biological substances status: Secondary | ICD-10-CM

## 2021-03-16 DIAGNOSIS — K922 Gastrointestinal hemorrhage, unspecified: Secondary | ICD-10-CM | POA: Diagnosis present

## 2021-03-16 DIAGNOSIS — K219 Gastro-esophageal reflux disease without esophagitis: Secondary | ICD-10-CM | POA: Diagnosis present

## 2021-03-16 DIAGNOSIS — I2510 Atherosclerotic heart disease of native coronary artery without angina pectoris: Secondary | ICD-10-CM | POA: Diagnosis not present

## 2021-03-16 DIAGNOSIS — E1151 Type 2 diabetes mellitus with diabetic peripheral angiopathy without gangrene: Secondary | ICD-10-CM | POA: Diagnosis present

## 2021-03-16 DIAGNOSIS — Z951 Presence of aortocoronary bypass graft: Secondary | ICD-10-CM | POA: Diagnosis not present

## 2021-03-16 DIAGNOSIS — Z7901 Long term (current) use of anticoagulants: Secondary | ICD-10-CM

## 2021-03-16 DIAGNOSIS — Z96652 Presence of left artificial knee joint: Secondary | ICD-10-CM | POA: Diagnosis present

## 2021-03-16 DIAGNOSIS — R0600 Dyspnea, unspecified: Secondary | ICD-10-CM | POA: Diagnosis present

## 2021-03-16 DIAGNOSIS — E785 Hyperlipidemia, unspecified: Secondary | ICD-10-CM | POA: Diagnosis present

## 2021-03-16 DIAGNOSIS — K2951 Unspecified chronic gastritis with bleeding: Secondary | ICD-10-CM | POA: Diagnosis not present

## 2021-03-16 DIAGNOSIS — I480 Paroxysmal atrial fibrillation: Secondary | ICD-10-CM | POA: Diagnosis present

## 2021-03-16 DIAGNOSIS — E119 Type 2 diabetes mellitus without complications: Secondary | ICD-10-CM

## 2021-03-16 DIAGNOSIS — I130 Hypertensive heart and chronic kidney disease with heart failure and stage 1 through stage 4 chronic kidney disease, or unspecified chronic kidney disease: Secondary | ICD-10-CM | POA: Diagnosis present

## 2021-03-16 DIAGNOSIS — I4891 Unspecified atrial fibrillation: Secondary | ICD-10-CM | POA: Diagnosis not present

## 2021-03-16 DIAGNOSIS — Z79899 Other long term (current) drug therapy: Secondary | ICD-10-CM

## 2021-03-16 DIAGNOSIS — K2990 Gastroduodenitis, unspecified, without bleeding: Secondary | ICD-10-CM

## 2021-03-16 DIAGNOSIS — Z7985 Long-term (current) use of injectable non-insulin antidiabetic drugs: Secondary | ICD-10-CM

## 2021-03-16 DIAGNOSIS — Z800 Family history of malignant neoplasm of digestive organs: Secondary | ICD-10-CM

## 2021-03-16 DIAGNOSIS — K5730 Diverticulosis of large intestine without perforation or abscess without bleeding: Secondary | ICD-10-CM | POA: Diagnosis present

## 2021-03-16 DIAGNOSIS — Z794 Long term (current) use of insulin: Secondary | ICD-10-CM

## 2021-03-16 DIAGNOSIS — Z20822 Contact with and (suspected) exposure to covid-19: Secondary | ICD-10-CM | POA: Diagnosis present

## 2021-03-16 DIAGNOSIS — E1140 Type 2 diabetes mellitus with diabetic neuropathy, unspecified: Secondary | ICD-10-CM | POA: Diagnosis present

## 2021-03-16 DIAGNOSIS — K635 Polyp of colon: Secondary | ICD-10-CM | POA: Diagnosis present

## 2021-03-16 DIAGNOSIS — I739 Peripheral vascular disease, unspecified: Secondary | ICD-10-CM | POA: Diagnosis present

## 2021-03-16 DIAGNOSIS — Z8249 Family history of ischemic heart disease and other diseases of the circulatory system: Secondary | ICD-10-CM

## 2021-03-16 DIAGNOSIS — I509 Heart failure, unspecified: Secondary | ICD-10-CM

## 2021-03-16 DIAGNOSIS — D72829 Elevated white blood cell count, unspecified: Secondary | ICD-10-CM | POA: Diagnosis present

## 2021-03-16 DIAGNOSIS — D62 Acute posthemorrhagic anemia: Secondary | ICD-10-CM | POA: Diagnosis not present

## 2021-03-16 LAB — COMPREHENSIVE METABOLIC PANEL
ALT: 14 U/L (ref 0–44)
AST: 26 U/L (ref 15–41)
Albumin: 2.9 g/dL — ABNORMAL LOW (ref 3.5–5.0)
Alkaline Phosphatase: 118 U/L (ref 38–126)
Anion gap: 12 (ref 5–15)
BUN: 52 mg/dL — ABNORMAL HIGH (ref 8–23)
CO2: 23 mmol/L (ref 22–32)
Calcium: 8.5 mg/dL — ABNORMAL LOW (ref 8.9–10.3)
Chloride: 97 mmol/L — ABNORMAL LOW (ref 98–111)
Creatinine, Ser: 2.79 mg/dL — ABNORMAL HIGH (ref 0.61–1.24)
GFR, Estimated: 22 mL/min — ABNORMAL LOW (ref >60)
Glucose, Bld: 137 mg/dL — ABNORMAL HIGH (ref 70–99)
Potassium: 4.1 mmol/L (ref 3.5–5.1)
Sodium: 132 mmol/L — ABNORMAL LOW (ref 135–145)
Total Bilirubin: 1.3 mg/dL — ABNORMAL HIGH (ref 0.3–1.2)
Total Protein: 7 g/dL (ref 6.5–8.1)

## 2021-03-16 LAB — CBC
HCT: 25.3 % — ABNORMAL LOW (ref 39.0–52.0)
Hemoglobin: 6.8 g/dL — CL (ref 13.0–17.0)
MCH: 22.1 pg — ABNORMAL LOW (ref 26.0–34.0)
MCHC: 26.9 g/dL — ABNORMAL LOW (ref 30.0–36.0)
MCV: 82.4 fL (ref 80.0–100.0)
Platelets: 317 10*3/uL (ref 150–400)
RBC: 3.07 MIL/uL — ABNORMAL LOW (ref 4.22–5.81)
RDW: 24.8 % — ABNORMAL HIGH (ref 11.5–15.5)
WBC: 12.3 10*3/uL — ABNORMAL HIGH (ref 4.0–10.5)
nRBC: 0 % (ref 0.0–0.2)

## 2021-03-16 LAB — PROTIME-INR
INR: 2 — ABNORMAL HIGH (ref 0.8–1.2)
Prothrombin Time: 23 seconds — ABNORMAL HIGH (ref 11.4–15.2)

## 2021-03-16 LAB — POC OCCULT BLOOD, ED: Fecal Occult Bld: POSITIVE — AB

## 2021-03-16 LAB — RETICULOCYTES
Immature Retic Fract: 32.6 % — ABNORMAL HIGH (ref 2.3–15.9)
RBC.: 3.09 MIL/uL — ABNORMAL LOW (ref 4.22–5.81)
Retic Count, Absolute: 185.4 10*3/uL (ref 19.0–186.0)
Retic Ct Pct: 6 % — ABNORMAL HIGH (ref 0.4–3.1)

## 2021-03-16 LAB — PREPARE RBC (CROSSMATCH)

## 2021-03-16 LAB — RESP PANEL BY RT-PCR (FLU A&B, COVID) ARPGX2
Influenza A by PCR: NEGATIVE
Influenza B by PCR: NEGATIVE
SARS Coronavirus 2 by RT PCR: NEGATIVE

## 2021-03-16 LAB — APTT: aPTT: 39 seconds — ABNORMAL HIGH (ref 24–36)

## 2021-03-16 LAB — HEMOGLOBIN A1C
Hgb A1c MFr Bld: 4.6 % — ABNORMAL LOW (ref 4.8–5.6)
Mean Plasma Glucose: 85.32 mg/dL

## 2021-03-16 LAB — BRAIN NATRIURETIC PEPTIDE: B Natriuretic Peptide: 81.2 pg/mL (ref 0.0–100.0)

## 2021-03-16 MED ORDER — DIPHENHYDRAMINE HCL 25 MG PO CAPS
25.0000 mg | ORAL_CAPSULE | Freq: Once | ORAL | Status: AC
  Administered 2021-03-16: 25 mg via ORAL
  Filled 2021-03-16: qty 1

## 2021-03-16 MED ORDER — FUROSEMIDE 10 MG/ML IJ SOLN
40.0000 mg | Freq: Every day | INTRAMUSCULAR | Status: DC
Start: 2021-03-16 — End: 2021-03-16

## 2021-03-16 MED ORDER — SODIUM CHLORIDE 0.9 % IV SOLN
10.0000 mL/h | Freq: Once | INTRAVENOUS | Status: AC
  Administered 2021-03-16: 10 mL/h via INTRAVENOUS

## 2021-03-16 MED ORDER — FUROSEMIDE 10 MG/ML IJ SOLN
40.0000 mg | Freq: Every day | INTRAMUSCULAR | Status: DC
  Administered 2021-03-17 – 2021-03-18 (×2): 40 mg via INTRAVENOUS
  Filled 2021-03-16 (×2): qty 4

## 2021-03-16 MED ORDER — ALBUTEROL SULFATE (2.5 MG/3ML) 0.083% IN NEBU: 2.5000 mg | INHALATION_SOLUTION | Freq: Four times a day (QID) | RESPIRATORY_TRACT | Status: DC | PRN

## 2021-03-16 MED ORDER — ALLOPURINOL 100 MG PO TABS
100.0000 mg | ORAL_TABLET | Freq: Every day | ORAL | Status: DC
Start: 2021-03-17 — End: 2021-03-19
  Administered 2021-03-17 – 2021-03-19 (×3): 100 mg via ORAL
  Filled 2021-03-16 (×3): qty 1

## 2021-03-16 MED ORDER — PANTOPRAZOLE SODIUM 40 MG IV SOLR
40.0000 mg | Freq: Two times a day (BID) | INTRAVENOUS | Status: DC
  Administered 2021-03-16 – 2021-03-19 (×6): 40 mg via INTRAVENOUS
  Filled 2021-03-16 (×6): qty 10

## 2021-03-16 MED ORDER — ACETAMINOPHEN 325 MG PO TABS: 650.0000 mg | ORAL_TABLET | Freq: Four times a day (QID) | ORAL | Status: DC | PRN

## 2021-03-16 MED ORDER — FUROSEMIDE 10 MG/ML IJ SOLN
40.0000 mg | Freq: Once | INTRAMUSCULAR | Status: AC
  Administered 2021-03-16: 40 mg via INTRAVENOUS
  Filled 2021-03-16: qty 4

## 2021-03-16 MED ORDER — PANTOPRAZOLE SODIUM 40 MG PO TBEC: 40.0000 mg | DELAYED_RELEASE_TABLET | Freq: Every day | ORAL | Status: DC

## 2021-03-16 MED ORDER — ACETAMINOPHEN 325 MG PO TABS
650.0000 mg | ORAL_TABLET | Freq: Once | ORAL | Status: AC
  Administered 2021-03-16: 650 mg via ORAL
  Filled 2021-03-16: qty 2

## 2021-03-16 NOTE — H&P (View-Only) (Signed)
Referring Provider: Emergency Services PCP: [facility], [facility]  Gastroenterologist: Zenovia Jarred, MD Reason for consultation:   anemia                 ASSESSMENT / PLAN   #  Acute on chronic anemia.  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  Probably iron deficient based on his history and low MCV on January labs in [place].  Stools dark on iron but heme +. Recurrent anemia possibly related to erosive gastritis, PUD, AVMs.  --Will plan for EGD tomorrow with understanding that any endoscopic intervention will be limited because his last dose of Elqius and plavix was just this am.  The risks and benefits of EGD with possible biopsies were discussed with the patient who agrees to proceed.  --Full liquids today, NPO after MN --BID PPI  # History of colon polyps. Small adenoma in 2015. Will need to weigh benefits / risks of surveillance colonoscopy as outpatient. If EGD negative this admission then it may become more pressing.   # AFIB on Eliquis, last dose this am  # CAD, remote CABG. Had PCI in January 2023, on plavix  # Additional medical history listed below.     [facility] Attending   I have also evaluated the patient with [name].  EGD for tomorrow.  It is limited slightly we still can do bleeding treatment with clips and injection if needed I think and possibly thermal therapy depending upon the context.  I think it makes sense to proceed rather than delay and wait for all of his medications to washout.  If the EGD is unrevealing then we will need to schedule a colonoscopy.  The risks and benefits as well as alternatives of endoscopic procedure(s) have been discussed and reviewed. All questions answered. The patient agrees to proceed.  [name], MD, [facility] 03/16/2021 6:26 PM    HISTORY OF PRESENT  ILLNESS                                                                                                                         Chief Complaint:  anemia  Edward [name] is a 81 y.o. 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 carotid artery stenosis s/p  right CEA, hypertension, hyperlipidemia, CKD II.  See [facility] for any additional medical problems.   May 2020 - follow up visit with Dr. Hilarie Fredrickson. At that time his last colonoscopy was just under 5 years ago. Decision was made to not due  5 year follow up but reevaluate at on year but he hasn't been since since.    Patient presented to ED today for evaluation of anemia following Cardiology visit. He has been tired and SHOB. Hgb was 6.8 ( it was 10.3 in late Jan at [facility]). MCV 82. . Stools are always dark on chronic iron. FOBT +. No acute cardiopulm processes on CXR but does show new small left pleural effusion. EKG >> AFIB . In January he has a cardiac stent placed  and since has been on plavix and asa ( completed last week). Also still on Eliquis for Afib. He is compliant with oral iron. He takes Omeprazole QD. He hasn't had any abdominal pain. Stools dark on iron as they always are, no change in consistency or frequency. He will get nauseated if takes several meds at one time without food but no other GI complaints  ENDOSCOPIC EVALUATIONS     Feb 2015 EGD  --erosive and nodular gastritis.   Feb 2015 colonoscopy   -- one 4 mm adenoma removed from the cecum.  There was diverticulosis throughout the entire colon as well as internal and external hemorrhoids.  Duodenum, Biopsy - BENIGN SMALL BOWEL MUCOSA. NO VILLOUS  ATROPHY, INFLAMMATION OR OTHER ABNORMALITIES PRESENT. 2. Stomach, biopsy - GASTRIC BODY AND ANTRAL-TYPE MUCOSA WITH ASSOCIATED CHRONIC INFLAMMATION. - NO EVIDENCE OF HELICOBACTER PYLORI, INTESTINAL METAPLASIA, DYSPLASIA OR MALIGNANCY. 3. Colon, polyp(s), cecal - TUBULAR ADENOMA (ONE FRAGMENT). NO HIGH GRADE DYSPLASIA OR MALIGNANCY IDENTIFIED       IMAGING:  DG Chest 2 View  Result Date: 03/16/2021 CLINICAL DATA:  Shortness of breath.  Weakness.  Anemia. EXAM: CHEST - 2 VIEW COMPARISON:  Chest two views 02/12/2017 FINDINGS: Left chest wall cardiac pacer is seen with leads not significantly changed. Atrial appendage closure device is again seen. Cardiac silhouette is again mildly enlarged. Status post median sternotomy. Mild calcification is again seen within aortic arch. Small left pleural effusion new from prior. Otherwise the lungs are clear. No pneumothorax is seen. No acute skeletal abnormality. IMPRESSION: Small left pleural effusion. Unchanged mild cardiomegaly. Electronically Signed   By: Yvonne Kendall M.D.   On: 03/16/2021 13:50     Past Medical History:  Diagnosis Date   Anemia    ?   Carotid artery occlusion    Chronic diastolic CHF (congestive heart failure) ([place]) 04/23/2012   CKD (chronic kidney disease), stage III (HCC)    Claudication (HCC)    Coronary artery disease    s/p CABG 2013   Diabetes ([place])    Diabetic neuropathy ([place])    mostly feet/ legs   Diverticulosis    Dyslipidemia    Exertional shortness of breath    GERD (gastroesophageal reflux disease)    GI bleed    while on triple anticoag therapy   HTN (hypertension)    Hyperlipidemia    Neuromuscular disorder (HCC)    neuropathy   Pacemaker    PAF (paroxysmal atrial fibrillation) ([place])    s/p MAZE at time of CABG   PVD (peripheral vascular disease) ([place])    R CEA 2011, prior PTA, left fem-pop 2014   Stroke ([place])    vs TIA   Tubular adenoma of colon    Type II diabetes  mellitus ([place])     Past Surgical History:  Procedure Laterality Date   ABDOMINAL AORTAGRAM N/A 01/07/2012   Procedure: ABDOMINAL Maxcine [place];  Surgeon: Angelia Mould, MD;  Location: [facility] CATH LAB;  Service: Cardiovascular;  Laterality: N/A;   BALLOON DILATION N/A 02/17/2013   Procedure: BALLOON DILATION;  Surgeon: Jerene Bears, MD;  Location: [facility] ENDOSCOPY;  Service: Gastroenterology;  Laterality: N/A;   CAROTID ENDARTERECTOMY Right 03/2009   CATARACT EXTRACTION Bilateral    CHOLECYSTECTOMY     CIRCUMCISION     COLONOSCOPY WITH PROPOFOL N/A 02/17/2013   Procedure: COLONOSCOPY WITH PROPOFOL;  Surgeon: Jerene Bears, MD;  Location: [facility] ENDOSCOPY;  Service: Gastroenterology;  Laterality: N/A;   CORONARY ARTERY BYPASS GRAFT  06/14/2011   Procedure: CORONARY ARTERY BYPASS GRAFTING (CABG);  Surgeon: Gaye Pollack, MD;  Location: [place];  Service: Open Heart Surgery;  Laterality: N/A;  Coronary Artery Bypass Graft on pump times six;  utilizing internal mammary artery and right greater saphenous vein harvested endoscopically.x 5 vessels   ELECTROPHYSIOLOGY STUDY N/A 11/05/2012   Procedure: ELECTROPHYSIOLOGY STUDY;  Surgeon: Deboraha Sprang, MD;  Location: [facility] CATH LAB;  Service: Cardiovascular;  Laterality: N/A;   ESOPHAGOGASTRODUODENOSCOPY (EGD) WITH PROPOFOL N/A 02/17/2013   Procedure: ESOPHAGOGASTRODUODENOSCOPY (EGD) WITH PROPOFOL;  Surgeon: Jerene Bears, MD;  Location: [facility] ENDOSCOPY;  Service: Gastroenterology;  Laterality: N/A;   FEMORAL-POPLITEAL BYPASS GRAFT Left 03/06/2012   Procedure: BYPASS GRAFT FEMORAL-POPLITEAL ARTERY;  Surgeon: Angelia Mould, MD;  Location: [place];  Service: Vascular;  Laterality: Left;  Left Femoral - Below Knee Popliteal Bypass Graft with Vein and Intraoperative Arteriogram.   JOINT REPLACEMENT Left    left TKA   KNEE ARTHROSCOPY Left    "I had 3" (11/05/2012)   LEAD REVISION  11/05/2012   pacemaker/notes 11/05/2012   LEAD REVISION N/A 11/05/2012    Procedure: LEAD REVISION;  Surgeon: Deboraha Sprang, MD;  Location: [facility] CATH LAB;  Service: Cardiovascular;  Laterality: N/A;   MAZE  06/14/2011   Procedure: MAZE;  Surgeon: Gaye Pollack, MD;  Location: [place];  Service: Open Heart Surgery;  Laterality: N/A;  Ligate left atrial appendage   PERMANENT PACEMAKER INSERTION N/A 06/20/2011   Procedure: PERMANENT PACEMAKER INSERTION;  Surgeon: Deboraha Sprang, MD;  Location[facility] LAB;  Service: Cardiovascular;  Laterality: N/A;   TONSILLECTOMY AND ADENOIDECTOMY     TOTAL KNEE ARTHROPLASTY Left     Prior to Admission medications   Medication Sig Start Date End Date Taking? Authorizing Provider  acetaminophen (TYLENOL) 650 MG CR tablet Take 650 mg by mouth every 8 (eight) hours as needed for pain.    [provider]  albuterol (PROVENTIL HFA;VENTOLIN HFA) 108 (90 Base) MCG/ACT inhaler USE 2 PUFFS EVERY 4 HOURS AS NEEDED FOR WHEEZING Patient taking differently: Inhale 2 puffs into the lungs every 6 (six) hours as needed for shortness of breath. 11/11/17   Nahser, Wonda Cheng, MD  allopurinol (ZYLOPRIM) 100 MG tablet Take 100 mg by mouth daily.    [provider]  apixaban (ELIQUIS) 2.5 MG TABS tablet Take 1.25 mg by mouth 2 (two) times daily.     [provider]  atorvastatin (LIPITOR) 40 MG tablet TAKE ONE TABLET BY MOUTH AT BEDTIME FOR CHOLESTEROL. STOP ROSUVASTATIN 02/11/20 02/11/21  [provider]  blood glucose meter kit and supplies Dispense based on patient and insurance preference. Use up to four times daily as directed. (FOR ICD-10 E10.9, E11.9). 02/18/17   Scot Jun, FNP  carvedilol (COREG) 6.25 MG tablet Take 1 tablet (6.25 mg total) by mouth 2 (two) times daily. 02/24/19   Nahser, Wonda Cheng, MD  cholecalciferol (VITAMIN D) 1000 units tablet Take 1,000 Units by  mouth daily.    [provider]  Cyanocobalamin 500 MCG TBDP Take 1 tablet by mouth daily.    [provider]  ferrous sulfate 325  (65 FE) MG tablet Take 325 mg by mouth daily with breakfast.    [provider]  furosemide (LASIX) 40 MG tablet TAKE 2 TABLETS BY MOUTH EVERY MORNING AND TAKE 1 TABLET IN THE AFTERNOON Patient taking differently: Take 80 mg by mouth 2 (two) times daily. 03/18/18   Nahser, Wonda Cheng, MD  gabapentin (NEURONTIN) 100 MG capsule Take 100 mg by mouth 2 (two) times daily.    [provider]  insulin regular (NOVOLIN R,HUMULIN R) 100 units/mL injection Inject 30 Units into the skin 2 (two) times daily before a meal. As needed    [provider]  loratadine (CLARITIN) 10 MG tablet Take 10 mg by mouth daily.    [provider]  MAGNESIUM PO Take 850 mg by mouth daily.    [provider]  Multiple Vitamin (MULTIVITAMIN) capsule Take 1 capsule by mouth daily.    [provider]  omeprazole (PRILOSEC) 20 MG capsule Take 20 mg by mouth 2 (two) times daily.    [provider]  Potassium 99 MG TABS Take 99 mg by mouth daily.    [provider]  rosuvastatin (CRESTOR) 20 MG tablet Take 20 mg by mouth daily.    [provider]  Semaglutide, 1 MG/DOSE, 2 MG/1.5ML SOPN Inject 1 mg into the skin once a week.    [provider]  sildenafil (VIAGRA) 50 MG tablet May take 1-2 tablets as needed for sexual intercourse 07/25/18   Nahser, Wonda Cheng, MD  spironolactone (ALDACTONE) 25 MG tablet Take 0.5 tablets (12.5 mg total) by mouth daily. 06/28/18   Strader, Fransisco Hertz, PA-C  testosterone cypionate (DEPOTESTOSTERONE CYPIONATE) 200 MG/ML injection Inject 200 mg into the muscle every 14 (fourteen) days.    [provider]  valsartan (DIOVAN) 80 MG tablet Take 80 mg by mouth daily.    [provider]    Current Facility-Administered Medications  Medication Dose Route Frequency Provider Last Rate Last Admin   0.9 %  sodium chloride infusion  10 mL/hr Intravenous Once Evlyn Courier, PA-C       Current Outpatient Medications   Medication Sig Dispense Refill   acetaminophen (TYLENOL) 650 MG CR tablet Take 650 mg by mouth every 8 (eight) hours as needed for pain.     albuterol (PROVENTIL HFA;VENTOLIN HFA) 108 (90 Base) MCG/ACT inhaler USE 2 PUFFS EVERY 4 HOURS AS NEEDED FOR WHEEZING (Patient taking differently: Inhale 2 puffs into the lungs every 6 (six) hours as needed for shortness of breath.) 8.5 Inhaler 0   allopurinol (ZYLOPRIM) 100 MG tablet Take 100 mg by mouth daily.     apixaban (ELIQUIS) 2.5 MG TABS tablet Take 1.25 mg by mouth 2 (two) times daily.      atorvastatin (LIPITOR) 40 MG tablet TAKE ONE TABLET BY MOUTH AT BEDTIME FOR CHOLESTEROL. STOP ROSUVASTATIN     blood glucose meter kit and supplies Dispense based on patient and insurance preference. Use up to four times daily as directed. (FOR ICD-10 E10.9, E11.9). 1 each 0   carvedilol (COREG) 6.25 MG tablet Take 1 tablet (6.25 mg total) by mouth 2 (two) times daily. 180 tablet 3   cholecalciferol (VITAMIN D) 1000 units tablet Take 1,000 Units by mouth daily.     Cyanocobalamin 500 MCG TBDP Take 1 tablet by mouth daily.  ferrous sulfate 325 (65 FE) MG tablet Take 325 mg by mouth daily with breakfast.     furosemide (LASIX) 40 MG tablet TAKE 2 TABLETS BY MOUTH EVERY MORNING AND TAKE 1 TABLET IN THE AFTERNOON (Patient taking differently: Take 80 mg by mouth 2 (two) times daily.) 270 tablet 2   gabapentin (NEURONTIN) 100 MG capsule Take 100 mg by mouth 2 (two) times daily.     insulin regular (NOVOLIN R,HUMULIN R) 100 units/mL injection Inject 30 Units into the skin 2 (two) times daily before a meal. As needed     loratadine (CLARITIN) 10 MG tablet Take 10 mg by mouth daily.     MAGNESIUM PO Take 850 mg by mouth daily.     Multiple Vitamin (MULTIVITAMIN) capsule Take 1 capsule by mouth daily.     omeprazole (PRILOSEC) 20 MG capsule Take 20 mg by mouth 2 (two) times daily.     Potassium 99 MG TABS Take 99 mg by mouth daily.     rosuvastatin  (CRESTOR) 20 MG tablet Take 20 mg by mouth daily.     Semaglutide, 1 MG/DOSE, 2 MG/1.5ML SOPN Inject 1 mg into the skin once a week.     sildenafil (VIAGRA) 50 MG tablet May take 1-2 tablets as needed for sexual intercourse 30 tablet 2   spironolactone (ALDACTONE) 25 MG tablet Take 0.5 tablets (12.5 mg total) by mouth daily.     testosterone cypionate (DEPOTESTOSTERONE CYPIONATE) 200 MG/ML injection Inject 200 mg into the muscle every 14 (fourteen) days.     valsartan (DIOVAN) 80 MG tablet Take 80 mg by mouth daily.      Allergies as of 03/16/2021 - Review Complete 03/16/2021  Allergen Reaction Noted   Lipitor [atorvastatin calcium] Other (See Comments) 05/30/2010    Family History  Problem Relation Age of Onset   Coronary artery disease Father    Hypertension Father    Heart disease Father        Heart Disease before age 7   Heart attack Mother    Coronary artery disease Mother    Deep vein thrombosis Mother    Heart disease Mother        Heart Disease before age 43   Hyperlipidemia Mother    Hypertension Mother    Hypertension Sister    Heart disease Sister        Heart Disease before age 63   Hyperlipidemia Sister    Heart disease Brother        Heart Disease before age 73   Colon cancer Brother 45   Anesthesia problems Neg Hx    Diabetes Neg Hx     Social History   Socioeconomic History   Marital status: Married    Spouse name: Not on file   Number of children: Not on file   Years of education: Not on file   Highest education level: Not on file  Occupational History   Not on file  Tobacco Use   Smoking status: Former    Packs/day: 3.00    Years: 32.00    Pack years: 96.00    Types: Cigarettes    Quit date: 01/16/1979    Years since quitting: 42.1   Smokeless tobacco: Never  Vaping Use   Vaping Use: Never used  Substance and Sexual Activity   Alcohol use: Yes    Alcohol/week: 2.0 standard drinks    Types: 2 Glasses of wine  per week    Comment: beer 1-2 weekly (occasional)  Drug use: No   Sexual activity: Not Currently  Other Topics Concern   Not on file  Social History Narrative   Not on file   Social Determinants of Health   Financial Resource Strain: Not on file  Food Insecurity: Not on file  Transportation Needs: Not on file  Physical Activity: Not on file  Stress: Not on file  Social Connections: Not on file  Intimate Partner Violence: Not on file    Review of Systems: All systems reviewed and negative except where noted in HPI.   OBJECTIVE    Physical Exam: Vital signs in last 24 hours: Temp:  [97.2 F (36.2 C)-97.7 F (36.5 C)] 97.3 F (36.3 C) (03/02 1547) Pulse Rate:  [70-77] 72 (03/02 1547) Resp:  [10-16] 16 (03/02 1547) BP: (116-137)/(60-65) 137/62 (03/02 1547) SpO2:  [100 %] 100 % (03/02 1531) Weight:  [100.2 kg] 100.2 kg (03/02 1540)    General:  Alert male in NAD Psych:  Pleasant, cooperative. Normal mood and affect Eyes: Pupils equal, no icterus. Conjunctive pink Ears:  Normal auditory acuity Nose: No deformity, discharge or lesions Neck:  Supple, no masses felt Lungs:  Clear to auscultation.  Heart:  Regular rate, 1+ BLE edema.  Abdomen:  Soft, nondistended, nontender, active bowel sounds, no masses felt Rectal :  Deferred Msk: Symmetrical without gross deformities.  Neurologic:  Alert, oriented, grossly normal neurologically Skin:  Intact without significant lesions.       Lab Results: Recent Labs    03/16/21 1256  WBC 12.3*  HGB 6.8*  HCT 25.3*  PLT 317   BMET Recent Labs    03/16/21 1256  NA 132*  K 4.1  CL 97*  CO2 23  GLUCOSE 137*  BUN 52*  CREATININE 2.79*  CALCIUM 8.5*   LFTs Recent Labs    03/16/21 1256  PROT 7.0  ALBUMIN 2.9*  AST 26  ALT 14  ALKPHOS 118  BILITOT 1.3*   PT/INR No results for input(s): LABPROT, INR in the last 72 hours. Hepatitis Panel No results for input(s): HEPBSAG, HCVAB, HEPAIGM, HEPBIGM in  the last 72 hours.   . CBC Latest Ref Rng & Units 03/16/2021 05/03/2018 02/12/2017  WBC 4.0 - 10.5 K/uL 12.3(H) 9.1 14.4(H)  Hemoglobin 13.0 - 17.0 g/dL 6.8(LL) 13.2 12.8(L)  Hematocrit 39.0 - 52.0 % 25.3(L) 43.1 38.5(L)  Platelets 150 - 400 K/uL 317 173 179    . CMP Latest Ref Rng & Units 03/16/2021 05/03/2018 04/04/2018  Glucose 70 - 99 mg/dL 137(H) 70 94  BUN 8 - 23 mg/dL 52(H) 25(H) 35(H)  Creatinine 0.61 - 1.24 mg/dL 2.79(H) 2.34(H) 2.55(H)  Sodium 135 - 145 mmol/L 132(L) 139 140  Potassium 3.5 - 5.1 mmol/L 4.1 4.2 4.2  Chloride 98 - 111 mmol/L 97(L) 104 104  CO2 22 - 32 mmol/L 23 23 19(L)  Calcium 8.9 - 10.3 mg/dL 8.5(L) 8.9 8.9  Total Protein 6.5 - 8.1 g/dL 7.0 7.5 -  Total Bilirubin 0.3 - 1.2 mg/dL 1.3(H) 1.9(H) -  Alkaline Phos 38 - 126 U/L 118 98 -  AST 15 - 41 U/L 26 26 -  ALT 0 - 44 U/L 14 15 -     Active Problems:   * No active hospital problems. Tye Savoy, NP-C @  03/16/2021, 4:15 PM

## 2021-03-16 NOTE — ED Provider Notes (Addendum)
[facility] EMERGENCY DEPARTMENT Provider Note   CSN: [id number] Arrival date & time: 03/16/21  1203     History  Chief Complaint  Patient presents with   Abnormal Lab    Edward [name] is a 81 y.o. male.  81 year old male with past medical history of A-fib on Eliquis, CAD status post CABG recent LHC s/p PCI January 2022, CHF, CKD, anemia presents today for evaluation of acute GI bleed from cardiology office.  Wife is at bedside and provides additional history.  Wife reports patient has been having worsening fatigue, shortness of breath this he went to his cardiologist for evaluation.  They did labs and found his hemoglobin to be 6.7.  He was referred here for evaluation of GI bleed.  Denies hematemesis, bright red blood per rectum.  He reports baseline black stools because he is on iron supplement.  He does endorse a fall that occurred last week which was mechanical in nature.  He states he tripped on his shoes and fell backwards.  He hit his left elbow and fell onto his buttocks.  He denies head injury, loss of consciousness, or other injuries.  The history is provided by the patient. No language interpreter was used.      Home Medications Prior to Admission medications   Medication Sig Start Date End Date Taking? Authorizing Provider  acetaminophen (TYLENOL) 650 MG CR tablet Take 650 mg by mouth every 8 (eight) hours as needed for pain.    [provider]  albuterol (PROVENTIL HFA;VENTOLIN HFA) 108 (90 Base) MCG/ACT inhaler USE 2 PUFFS EVERY 4 HOURS AS NEEDED FOR WHEEZING 11/11/17   Nahser, Wonda Cheng, MD  allopurinol (ZYLOPRIM) 100 MG tablet Take 100 mg by mouth daily.    [provider]  apixaban (ELIQUIS) 2.5 MG TABS tablet Take 1.25 mg by mouth 2 (two) times daily.     [provider]  atorvastatin (LIPITOR) 40 MG tablet TAKE ONE TABLET BY MOUTH AT BEDTIME FOR CHOLESTEROL. STOP ROSUVASTATIN 02/11/20 02/11/21  [provider]   blood glucose meter kit and supplies Dispense based on patient and insurance preference. Use up to four times daily as directed. (FOR ICD-10 E10.9, E11.9). 02/18/17   Scot Jun, FNP  carvedilol (COREG) 6.25 MG tablet Take 1 tablet (6.25 mg total) by mouth 2 (two) times daily. 02/24/19   Nahser, Wonda Cheng, MD  cholecalciferol (VITAMIN D) 1000 units tablet Take 1,000 Units by mouth daily.    [provider]  Cyanocobalamin 500 MCG TBDP Take 1 tablet by mouth daily.    [provider]  ferrous sulfate 325 (65 FE) MG tablet Take 325 mg by mouth daily with breakfast.    [provider]  furosemide (LASIX) 40 MG tablet TAKE 2 TABLETS BY MOUTH EVERY MORNING AND TAKE 1 TABLET IN THE AFTERNOON 03/18/18   Nahser, Wonda Cheng, MD  gabapentin (NEURONTIN) 100 MG capsule Take 1 tablet every morning and 2 tablets by mouth every evening.    [provider]  insulin regular (NOVOLIN R,HUMULIN R) 100 units/mL injection Inject 30 Units into the skin 2 (two) times daily before a meal. As needed    [provider]  loratadine (CLARITIN) 10 MG tablet Take 10 mg by mouth daily.    [provider]  MAGNESIUM PO Take 850 mg by mouth daily.    [provider]  Multiple Vitamin (MULTIVITAMIN) capsule Take 1 capsule by mouth daily.    [provider]  omeprazole (PRILOSEC) 20 MG capsule Take 20 mg by mouth 2 (two) times daily.    [provider]  Potassium 99 MG TABS Take 99 mg by mouth daily.    [provider]  rosuvastatin (CRESTOR) 20 MG tablet Take 20 mg by mouth daily.    [provider]  Semaglutide, 1 MG/DOSE, 2 MG/1.5ML SOPN Inject 1 mg into the skin once a week.    [provider]  sildenafil (VIAGRA) 50 MG tablet May take 1-2 tablets as needed for sexual intercourse 07/25/18   Nahser, Wonda Cheng, MD  spironolactone (ALDACTONE) 25 MG tablet Take 0.5 tablets (12.5 mg total) by mouth daily. 06/28/18   Strader,  Fransisco Hertz, PA-C  testosterone cypionate (DEPOTESTOSTERONE CYPIONATE) 200 MG/ML injection Inject 200 mg into the muscle every 14 (fourteen) days.    [provider]  valsartan (DIOVAN) 80 MG tablet Take 80 mg by mouth daily.    [provider]      Allergies    Lipitor [atorvastatin calcium]    Review of Systems   Review of Systems  Constitutional:  Positive for fatigue. Negative for chills and fever.  Respiratory:  Positive for shortness of breath. Negative for cough.   Gastrointestinal:  Negative for abdominal pain, blood in stool, nausea and vomiting.  Skin:  Positive for wound.  Neurological:  Negative for weakness.  All other systems reviewed and are negative.  Physical Exam Updated Vital Signs BP 116/60 (BP Location: Left Arm)    Pulse 77    Temp 97.7 F (36.5 C) (Oral)    Resp 16    SpO2 100%  Physical Exam Vitals and nursing note reviewed. Exam conducted with a chaperone present.  Constitutional:      General: He is not in acute distress.    Appearance: Normal appearance. He is not ill-appearing.  HENT:     Head: Normocephalic and atraumatic.     Nose: Nose normal.  Eyes:     General: No scleral icterus.    Extraocular Movements: Extraocular movements intact.     Conjunctiva/sclera: Conjunctivae normal.  Cardiovascular:     Rate and Rhythm: Normal rate and regular rhythm.     Pulses: Normal pulses.     Heart sounds: Normal heart sounds.  Pulmonary:     Effort: Pulmonary effort is normal. No respiratory distress.     Breath sounds: Normal breath sounds. No wheezing or rales.  Abdominal:     General: There is no distension.     Tenderness: There is no abdominal tenderness. There is no right CVA tenderness, left CVA tenderness or guarding.  Genitourinary:    Rectum: No external hemorrhoid. Normal anal tone.     Comments: Dark stools on exam.  Without frank blood. Musculoskeletal:        General: Normal range of motion.     Cervical back: Normal  range of motion.     Right lower leg: Edema (Left greater than right.) present.     Left lower leg: Edema present.  Skin:    General: Skin is warm and dry.  Neurological:     General: No focal deficit present.     Mental Status: He is alert. Mental status is at baseline.    ED Results / Procedures / Treatments   Labs (all labs ordered are listed, but only abnormal results are displayed) Labs Reviewed  COMPREHENSIVE METABOLIC PANEL - Abnormal; Notable for the following components:      Result Value  Sodium 132 (*)    Chloride 97 (*)    Glucose, Bld 137 (*)    BUN 52 (*)    Creatinine, Ser 2.79 (*)    Calcium 8.5 (*)    Albumin 2.9 (*)    Total Bilirubin 1.3 (*)    GFR, Estimated 22 (*)    All other components within normal limits  CBC - Abnormal; Notable for the following components:   WBC 12.3 (*)    RBC 3.07 (*)    Hemoglobin 6.8 (*)    HCT 25.3 (*)    MCH 22.1 (*)    MCHC 26.9 (*)    RDW 24.8 (*)    All other components within normal limits  BRAIN NATRIURETIC PEPTIDE  POC OCCULT BLOOD, ED  TYPE AND SCREEN    EKG None  Radiology DG Chest 2 View  Result Date: 03/16/2021 CLINICAL DATA:  Shortness of breath.  Weakness.  Anemia. EXAM: CHEST - 2 VIEW COMPARISON:  Chest two views 02/12/2017 FINDINGS: Left chest wall cardiac pacer is seen with leads not significantly changed. Atrial appendage closure device is again seen. Cardiac silhouette is again mildly enlarged. Status post median sternotomy. Mild calcification is again seen within aortic arch. Small left pleural effusion new from prior. Otherwise the lungs are clear. No pneumothorax is seen. No acute skeletal abnormality. IMPRESSION: Small left pleural effusion. Unchanged mild cardiomegaly. Electronically Signed   By: Yvonne Kendall M.D.   On: 03/16/2021 13:50    Procedures .Critical Care Performed by: Evlyn Courier, PA-C Authorized by: Evlyn Courier, PA-C   Critical care provider statement:    Critical care time  (minutes):  35   Critical care was necessary to treat or prevent imminent or life-threatening deterioration of the following conditions:  Circulatory failure and shock   Critical care was time spent personally by me on the following activities:  Development of treatment plan with patient or surrogate, discussions with consultants, evaluation of patient's response to treatment, examination of patient, ordering and review of laboratory studies, ordering and review of radiographic studies, ordering and performing treatments and interventions, pulse oximetry, re-evaluation of patient's condition, review of old charts and obtaining history from patient or surrogate    Medications Ordered in ED Medications - No data to display  ED Course/ Medical Decision Making/ A&P                           Medical Decision Making Amount and/or Complexity of Data Reviewed Labs: ordered.  Risk OTC drugs. Prescription drug management.   Medical Decision Making / ED Course   This patient presents to the ED for concern of acute anemia, this involves an extensive number of treatment options, and is a complaint that carries with it a high risk of complications and morbidity.  The differential diagnosis includes GI bleed  MDM: 81 year old male with past medical history of CHF, CKD, A-fib on Eliquis, CAD status post CABG status post left heart cath January 2023 with PCI placement presents today for evaluation of acute anemia.  Patient's baseline hemoglobin is around 10.  Today he presented to his cardiology office and was found to have hemoglobin of 6.7.  He was referred to the emergency room for further evaluation.  Patient takes iron supplement and has dark stools at baseline.  He is normotensive, without tachycardia or other signs of hemodynamic instability.  Denies any changes to his stool or any other signs of active bleeding.  Dark  stools on rectal exam.  Will Hemoccult.  He has been on triple therapy aspirin,  Plavix, Eliquis for the past month and recently stopped aspirin after completion of 1 month of triple therapy.  He currently takes Plavix and Eliquis.  He is without abdominal pain, chest pain, bright red blood per rectum, hematemesis, or hemoptysis.  Initial work-up significant for CBC with mild leukocytosis of 12.3, hemoglobin of 6.8, CMP with creatinine 2.79 which is slightly above baseline, BNP of 81, chest x-ray without acute cardiopulm process, but does show new small left pleural effusion.  EKG with A-fib. Hemoccult positive.  Patient also states he has been 3 months since he has had left lower extremity swelling that has been worse than the right side.  He states he has had ultrasound done which ruled out DVT.  Case discussed with [facility] who will evaluate patient.    Additional history obtained: -Additional history obtained from wife who was at bedside.  [facility] paperwork that patient brought with him from today's visit confirming patient's hemoglobin was 6.7 in office today.  Also confirmed patient was on triple therapy until last week and now on Plavix and Eliquis only.  Aspirin was discontinued last week. -External records from outside source obtained and reviewed including: Chart review including previous notes, labs, imaging, consultation notes   Lab Tests: -I ordered, reviewed, and interpreted labs.   The pertinent results include:   Labs Reviewed  COMPREHENSIVE METABOLIC PANEL - Abnormal; Notable for the following components:      Result Value   Sodium 132 (*)    Chloride 97 (*)    Glucose, Bld 137 (*)    BUN 52 (*)    Creatinine, Ser 2.79 (*)    Calcium 8.5 (*)    Albumin 2.9 (*)    Total Bilirubin 1.3 (*)    GFR, Estimated 22 (*)    All other components within normal limits  CBC - Abnormal; Notable for the following components:   WBC 12.3 (*)    RBC 3.07 (*)    Hemoglobin 6.8 (*)    HCT 25.3 (*)    MCH 22.1 (*)    MCHC 26.9 (*)    RDW 24.8 (*)    All  other components within normal limits  BRAIN NATRIURETIC PEPTIDE  POC OCCULT BLOOD, ED  POC OCCULT BLOOD, ED  TYPE AND SCREEN  PREPARE RBC (CROSSMATCH)      EKG  EKG Interpretation  Date/Time:  Thursday March 16 2021 14:10:57 EST Ventricular Rate:  77 PR Interval:    QRS Duration: 107 QT Interval:  400 QTC Calculation: 453 R Axis:   80 Text Interpretation: Atrial fibrillation Low voltage, extremity leads Nonspecific T abnormalities, inferior leads Baseline wander in lead(s) I Confirmed by Pattricia Boss [phone]) on 03/16/2021 2:30:56 PM         Imaging Studies ordered: I ordered imaging studies including chest x-ray I independently visualized and interpreted imaging. I agree with the radiologist interpretation   Medicines ordered and prescription drug management: Meds ordered this encounter  Medications   0.9 %  sodium chloride infusion   furosemide (LASIX) injection 40 mg   acetaminophen (TYLENOL) tablet 650 mg   diphenhydrAMINE (BENADRYL) capsule 25 mg    -I have reviewed the patients home medicines and have made adjustments as needed  Critical interventions transfusion 1 unit of PRBC  Consultations Obtained: I requested consultation with the gastroenterology,  and discussed lab and imaging findings as well as pertinent plan -  they recommend: They will see patient in consult for evaluation of acute GI bleed  Cardiac Monitoring: The patient was maintained on a cardiac monitor.  I personally viewed and interpreted the cardiac monitored which showed an underlying rhythm of: Rate controlled A-fib  Reevaluation: After the interventions noted above, I reevaluated the patient and found that they have :stayed the same  Co morbidities that complicate the patient evaluation  Past Medical History:  Diagnosis Date   Anemia    ?   Carotid artery occlusion    Chronic diastolic CHF (congestive heart failure) ([place]) 04/23/2012   CKD (chronic kidney disease), stage III (HCC)     Claudication (HCC)    Coronary artery disease    s/p CABG 2013   Diabetes ([place])    Diabetic neuropathy ([place])    mostly feet/ legs   Diverticulosis    Dyslipidemia    Exertional shortness of breath    GERD (gastroesophageal reflux disease)    GI bleed    while on triple anticoag therapy   HTN (hypertension)    Hyperlipidemia    Neuromuscular disorder (HCC)    neuropathy   Pacemaker    PAF (paroxysmal atrial fibrillation) ([place])    s/p MAZE at time of CABG   PVD (peripheral vascular disease) ([place])    R CEA 2011, prior PTA, left fem-pop 2014   Stroke ([place])    vs TIA   Tubular adenoma of colon    Type II diabetes mellitus ([place])       Dispostion: At the end of my shift patient is awaiting a call to admitting team for admission for further work-up and management of acute blood loss anemia. Signed out to Margarita Mail PA-C for discussing case with admitting team.    Final Clinical Impression(s) / ED Diagnoses Final diagnoses:  Acute blood loss anemia    Rx / DC Orders ED Discharge Orders     None         Evlyn Courier, PA-C 03/16/21 1541    Evlyn Courier, PA-C 03/16/21 1543    Pattricia Boss, MD 03/21/21 [phone]

## 2021-03-16 NOTE — Consult Note (Addendum)
Referring Provider: Emergency Services PCP: [facility], [facility]  Gastroenterologist: Zenovia Jarred, MD Reason for consultation:   anemia                 ASSESSMENT / PLAN   #  Acute on chronic anemia.  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  Probably iron deficient based on his history and low MCV on January labs in [place].  Stools dark on iron but heme +. Recurrent anemia possibly related to erosive gastritis, PUD, AVMs.  --Will plan for EGD tomorrow with understanding that any endoscopic intervention will be limited because his last dose of Elqius and plavix was just this am.  The risks and benefits of EGD with possible biopsies were discussed with the patient who agrees to proceed.  --Full liquids today, NPO after MN --BID PPI  # History of colon polyps. Small adenoma in 2015. Will need to weigh benefits / risks of surveillance colonoscopy as outpatient. If EGD negative this admission then it may become more pressing.   # AFIB on Eliquis, last dose this am  # CAD, remote CABG. Had PCI in January 2023, on plavix  # Additional medical history listed below.     [facility] Attending   I have also evaluated the patient with Edward [name].  EGD for tomorrow.  It is limited slightly we still can do bleeding treatment with clips and injection if needed I think and possibly thermal therapy depending upon the context.  I think it makes sense to proceed rather than delay and wait for all of his medications to washout.  If the EGD is unrevealing then we will need to schedule a colonoscopy.  The risks and benefits as well as alternatives of endoscopic procedure(s) have been discussed and reviewed. All questions answered. The patient agrees to proceed.  Edward Mayer, MD, [facility] 03/16/2021 6:26 PM    HISTORY OF PRESENT  ILLNESS                                                                                                                         Chief Complaint:  anemia  Edward [name] is a 81 y.o. 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 carotid artery stenosis s/p  right CEA, hypertension, hyperlipidemia, CKD II.  See [facility] for any additional medical problems.   May 2020 - follow up visit with Dr. Hilarie Fredrickson. At that time his last colonoscopy was just under 5 years ago. Decision was made to not due  5 year follow up but reevaluate at on year but he hasn't been since since.    Patient presented to ED today for evaluation of anemia following Cardiology visit. He has been tired and SHOB. Hgb was 6.8 ( it was 10.3 in late Jan at [facility]). MCV 82. . Stools are always dark on chronic iron. FOBT +. No acute cardiopulm processes on CXR but does show new small left pleural effusion. EKG >> AFIB . In January he has a cardiac stent placed  and since has been on plavix and asa ( completed last week). Also still on Eliquis for Afib. He is compliant with oral iron. He takes Omeprazole QD. He hasn't had any abdominal pain. Stools dark on iron as they always are, no change in consistency or frequency. He will get nauseated if takes several meds at one time without food but no other GI complaints  ENDOSCOPIC EVALUATIONS     Feb 2015 EGD  --erosive and nodular gastritis.   Feb 2015 colonoscopy   -- one 4 mm adenoma removed from the cecum.  There was diverticulosis throughout the entire colon as well as internal and external hemorrhoids.  Duodenum, Biopsy - BENIGN SMALL BOWEL MUCOSA. NO VILLOUS  ATROPHY, INFLAMMATION OR OTHER ABNORMALITIES PRESENT. 2. Stomach, biopsy - GASTRIC BODY AND ANTRAL-TYPE MUCOSA WITH ASSOCIATED CHRONIC INFLAMMATION. - NO EVIDENCE OF HELICOBACTER PYLORI, INTESTINAL METAPLASIA, DYSPLASIA OR MALIGNANCY. 3. Colon, polyp(s), cecal - TUBULAR ADENOMA (ONE FRAGMENT). NO HIGH GRADE DYSPLASIA OR MALIGNANCY IDENTIFIED       IMAGING:  DG Chest 2 View  Result Date: 03/16/2021 CLINICAL DATA:  Shortness of breath.  Weakness.  Anemia. EXAM: CHEST - 2 VIEW COMPARISON:  Chest two views 02/12/2017 FINDINGS: Left chest wall cardiac pacer is seen with leads not significantly changed. Atrial appendage closure device is again seen. Cardiac silhouette is again mildly enlarged. Status post median sternotomy. Mild calcification is again seen within aortic arch. Small left pleural effusion new from prior. Otherwise the lungs are clear. No pneumothorax is seen. No acute skeletal abnormality. IMPRESSION: Small left pleural effusion. Unchanged mild cardiomegaly. Electronically Signed   By: Yvonne Kendall M.D.   On: 03/16/2021 13:50     Past Medical History:  Diagnosis Date   Anemia    ?   Carotid artery occlusion    Chronic diastolic CHF (congestive heart failure) ([place]) 04/23/2012   CKD (chronic kidney disease), stage III (HCC)    Claudication (HCC)    Coronary artery disease    s/p CABG 2013   Diabetes ([place])    Diabetic neuropathy ([place])    mostly feet/ legs   Diverticulosis    Dyslipidemia    Exertional shortness of breath    GERD (gastroesophageal reflux disease)    GI bleed    while on triple anticoag therapy   HTN (hypertension)    Hyperlipidemia    Neuromuscular disorder (HCC)    neuropathy   Pacemaker    PAF (paroxysmal atrial fibrillation) ([place])    s/p MAZE at time of CABG   PVD (peripheral vascular disease) ([place])    R CEA 2011, prior PTA, left fem-pop 2014   Stroke ([place])    vs TIA   Tubular adenoma of colon    Type II diabetes  mellitus ([place])     Past Surgical History:  Procedure Laterality Date   ABDOMINAL AORTAGRAM N/A 01/07/2012   Procedure: ABDOMINAL Maxcine Ham;  Surgeon: Angelia Mould, MD;  Location: [facility] CATH LAB;  Service: Cardiovascular;  Laterality: N/A;   BALLOON DILATION N/A 02/17/2013   Procedure: BALLOON DILATION;  Surgeon: Jerene Bears, MD;  Location: [facility] ENDOSCOPY;  Service: Gastroenterology;  Laterality: N/A;   CAROTID ENDARTERECTOMY Right 03/2009   CATARACT EXTRACTION Bilateral    CHOLECYSTECTOMY     CIRCUMCISION     COLONOSCOPY WITH PROPOFOL N/A 02/17/2013   Procedure: COLONOSCOPY WITH PROPOFOL;  Surgeon: Jerene Bears, MD;  Location: [facility] ENDOSCOPY;  Service: Gastroenterology;  Laterality: N/A;   CORONARY ARTERY BYPASS GRAFT  06/14/2011   Procedure: CORONARY ARTERY BYPASS GRAFTING (CABG);  Surgeon: Gaye Pollack, MD;  Location: [place];  Service: Open Heart Surgery;  Laterality: N/A;  Coronary Artery Bypass Graft on pump times six;  utilizing internal mammary artery and right greater saphenous vein harvested endoscopically.x 5 vessels   ELECTROPHYSIOLOGY STUDY N/A 11/05/2012   Procedure: ELECTROPHYSIOLOGY STUDY;  Surgeon: Deboraha Sprang, MD;  Location:  [facility] CATH LAB;  Service: Cardiovascular;  Laterality: N/A;   ESOPHAGOGASTRODUODENOSCOPY (EGD) WITH PROPOFOL N/A 02/17/2013   Procedure: ESOPHAGOGASTRODUODENOSCOPY (EGD) WITH PROPOFOL;  Surgeon: Jerene Bears, MD;  Location: [facility] ENDOSCOPY;  Service: Gastroenterology;  Laterality: N/A;   FEMORAL-POPLITEAL BYPASS GRAFT Left 03/06/2012   Procedure: BYPASS GRAFT FEMORAL-POPLITEAL ARTERY;  Surgeon: Angelia Mould, MD;  Location: [place];  Service: Vascular;  Laterality: Left;  Left Femoral - Below Knee Popliteal Bypass Graft with Vein and Intraoperative Arteriogram.   JOINT REPLACEMENT Left    left TKA   KNEE ARTHROSCOPY Left    "I had 3" (11/05/2012)   LEAD REVISION  11/05/2012   pacemaker/notes 11/05/2012   LEAD REVISION N/A 11/05/2012    Procedure: LEAD REVISION;  Surgeon: Deboraha Sprang, MD;  Location: [facility] CATH LAB;  Service: Cardiovascular;  Laterality: N/A;   MAZE  06/14/2011   Procedure: MAZE;  Surgeon: Gaye Pollack, MD;  Location: [place];  Service: Open Heart Surgery;  Laterality: N/A;  Ligate left atrial appendage   PERMANENT PACEMAKER INSERTION N/A 06/20/2011   Procedure: PERMANENT PACEMAKER INSERTION;  Surgeon: Deboraha Sprang, MD;  Location: [facility] CATH LAB;  Service: Cardiovascular;  Laterality: N/A;   TONSILLECTOMY AND ADENOIDECTOMY     TOTAL KNEE ARTHROPLASTY Left     Prior to Admission medications   Medication Sig Start Date End Date Taking? Authorizing Provider  acetaminophen (TYLENOL) 650 MG CR tablet Take 650 mg by mouth every 8 (eight) hours as needed for pain.    [provider]  albuterol (PROVENTIL HFA;VENTOLIN HFA) 108 (90 Base) MCG/ACT inhaler USE 2 PUFFS EVERY 4 HOURS AS NEEDED FOR WHEEZING Patient taking differently: Inhale 2 puffs into the lungs every 6 (six) hours as needed for shortness of breath. 11/11/17   Nahser, Wonda Cheng, MD  allopurinol (ZYLOPRIM) 100 MG tablet Take 100 mg by mouth daily.    [provider]  apixaban (ELIQUIS) 2.5 MG TABS tablet Take 1.25 mg by mouth 2 (two) times daily.     [provider]  atorvastatin (LIPITOR) 40 MG tablet TAKE ONE TABLET BY MOUTH AT BEDTIME FOR CHOLESTEROL. STOP ROSUVASTATIN 02/11/20 02/11/21  [provider]  blood glucose meter kit and supplies Dispense based on patient and insurance preference. Use up to four times daily as directed. (FOR ICD-10 E10.9, E11.9). 02/18/17   Scot Jun, FNP  carvedilol (COREG) 6.25 MG tablet Take 1 tablet (6.25 mg total) by mouth 2 (two) times daily. 02/24/19   Nahser, Wonda Cheng, MD  cholecalciferol (VITAMIN D) 1000 units tablet Take 1,000 Units by  mouth daily.    [provider]  Cyanocobalamin 500 MCG TBDP Take 1 tablet by mouth daily.    [provider]  ferrous sulfate 325  (65 FE) MG tablet Take 325 mg by mouth daily with breakfast.    [provider]  furosemide (LASIX) 40 MG tablet TAKE 2 TABLETS BY MOUTH EVERY MORNING AND TAKE 1 TABLET IN THE AFTERNOON Patient taking differently: Take 80 mg by mouth 2 (two) times daily. 03/18/18   Nahser, Wonda Cheng, MD  gabapentin (NEURONTIN) 100 MG capsule Take 100 mg by mouth 2 (two) times daily.    [provider]  insulin regular (NOVOLIN R,HUMULIN R) 100 units/mL injection Inject 30 Units into the skin 2 (two) times daily before a meal. As needed    [provider]  loratadine (CLARITIN) 10 MG tablet Take 10 mg by mouth daily.    [provider]  MAGNESIUM PO Take 850 mg by mouth daily.    [provider]  Multiple Vitamin (MULTIVITAMIN) capsule Take 1 capsule by mouth daily.    [provider]  omeprazole (PRILOSEC) 20 MG capsule Take 20 mg by mouth 2 (two) times daily.    [provider]  Potassium 99 MG TABS Take 99 mg by mouth daily.    [provider]  rosuvastatin (CRESTOR) 20 MG tablet Take 20 mg by mouth daily.    [provider]  Semaglutide, 1 MG/DOSE, 2 MG/1.5ML SOPN Inject 1 mg into the skin once a week.    [provider]  sildenafil (VIAGRA) 50 MG tablet May take 1-2 tablets as needed for sexual intercourse 07/25/18   Nahser, Wonda Cheng, MD  spironolactone (ALDACTONE) 25 MG tablet Take 0.5 tablets (12.5 mg total) by mouth daily. 06/28/18   Strader, Fransisco Hertz, PA-C  testosterone cypionate (DEPOTESTOSTERONE CYPIONATE) 200 MG/ML injection Inject 200 mg into the muscle every 14 (fourteen) days.    [provider]  valsartan (DIOVAN) 80 MG tablet Take 80 mg by mouth daily.    [provider]    Current Facility-Administered Medications  Medication Dose Route Frequency Provider Last Rate Last Admin   0.9 %  sodium chloride infusion  10 mL/hr Intravenous Once Evlyn Courier, PA-C       Current Outpatient Medications   Medication Sig Dispense Refill   acetaminophen (TYLENOL) 650 MG CR tablet Take 650 mg by mouth every 8 (eight) hours as needed for pain.     albuterol (PROVENTIL HFA;VENTOLIN HFA) 108 (90 Base) MCG/ACT inhaler USE 2 PUFFS EVERY 4 HOURS AS NEEDED FOR WHEEZING (Patient taking differently: Inhale 2 puffs into the lungs every 6 (six) hours as needed for shortness of breath.) 8.5 Inhaler 0   allopurinol (ZYLOPRIM) 100 MG tablet Take 100 mg by mouth daily.     apixaban (ELIQUIS) 2.5 MG TABS tablet Take 1.25 mg by mouth 2 (two) times daily.      atorvastatin (LIPITOR) 40 MG tablet TAKE ONE TABLET BY MOUTH AT BEDTIME FOR CHOLESTEROL. STOP ROSUVASTATIN     blood glucose meter kit and supplies Dispense based on patient and insurance preference. Use up to four times daily as directed. (FOR ICD-10 E10.9, E11.9). 1 each 0   carvedilol (COREG) 6.25 MG tablet Take 1 tablet (6.25 mg total) by mouth 2 (two) times daily. 180 tablet 3   cholecalciferol (VITAMIN D) 1000 units tablet Take 1,000 Units by mouth daily.     Cyanocobalamin 500 MCG TBDP Take 1 tablet by mouth daily.  ferrous sulfate 325 (65 FE) MG tablet Take 325 mg by mouth daily with breakfast.     furosemide (LASIX) 40 MG tablet TAKE 2 TABLETS BY MOUTH EVERY MORNING AND TAKE 1 TABLET IN THE AFTERNOON (Patient taking differently: Take 80 mg by mouth 2 (two) times daily.) 270 tablet 2   gabapentin (NEURONTIN) 100 MG capsule Take 100 mg by mouth 2 (two) times daily.     insulin regular (NOVOLIN R,HUMULIN R) 100 units/mL injection Inject 30 Units into the skin 2 (two) times daily before a meal. As needed     loratadine (CLARITIN) 10 MG tablet Take 10 mg by mouth daily.     MAGNESIUM PO Take 850 mg by mouth daily.     Multiple Vitamin (MULTIVITAMIN) capsule Take 1 capsule by mouth daily.     omeprazole (PRILOSEC) 20 MG capsule Take 20 mg by mouth 2 (two) times daily.     Potassium 99 MG TABS Take 99 mg by mouth daily.     rosuvastatin  (CRESTOR) 20 MG tablet Take 20 mg by mouth daily.     Semaglutide, 1 MG/DOSE, 2 MG/1.5ML SOPN Inject 1 mg into the skin once a week.     sildenafil (VIAGRA) 50 MG tablet May take 1-2 tablets as needed for sexual intercourse 30 tablet 2   spironolactone (ALDACTONE) 25 MG tablet Take 0.5 tablets (12.5 mg total) by mouth daily.     testosterone cypionate (DEPOTESTOSTERONE CYPIONATE) 200 MG/ML injection Inject 200 mg into the muscle every 14 (fourteen) days.     valsartan (DIOVAN) 80 MG tablet Take 80 mg by mouth daily.      Allergies as of 03/16/2021 - Review Complete 03/16/2021  Allergen Reaction Noted   Lipitor [atorvastatin calcium] Other (See Comments) 05/30/2010    Family History  Problem Relation Age of Onset   Coronary artery disease Father    Hypertension Father    Heart disease Father        Heart Disease before age 60   Heart attack Mother    Coronary artery disease Mother    Deep vein thrombosis Mother    Heart disease Mother        Heart Disease before age 66   Hyperlipidemia Mother    Hypertension Mother    Hypertension Sister    Heart disease Sister        Heart Disease before age 31   Hyperlipidemia Sister    Heart disease Brother        Heart Disease before age 36   Colon cancer Brother 43   Anesthesia problems Neg Hx    Diabetes Neg Hx     Social History   Socioeconomic History   Marital status: Married    Spouse name: Not on file   Number of children: Not on file   Years of education: Not on file   Highest education level: Not on file  Occupational History   Not on file  Tobacco Use   Smoking status: Former    Packs/day: 3.00    Years: 32.00    Pack years: 96.00    Types: Cigarettes    Quit date: 01/16/1979    Years since quitting: 42.1   Smokeless tobacco: Never  Vaping Use   Vaping Use: Never used  Substance and Sexual Activity   Alcohol use: Yes    Alcohol/week: 2.0 standard drinks    Types: 2 Glasses of wine  per week    Comment: beer 1-2 weekly (occasional)  Drug use: No   Sexual activity: Not Currently  Other Topics Concern   Not on file  Social History Narrative   Not on file   Social Determinants of Health   Financial Resource Strain: Not on file  Food Insecurity: Not on file  Transportation Needs: Not on file  Physical Activity: Not on file  Stress: Not on file  Social Connections: Not on file  Intimate Partner Violence: Not on file    Review of Systems: All systems reviewed and negative except where noted in HPI.   OBJECTIVE    Physical Exam: Vital signs in last 24 hours: Temp:  [97.2 F (36.2 C)-97.7 F (36.5 C)] 97.3 F (36.3 C) (03/02 1547) Pulse Rate:  [70-77] 72 (03/02 1547) Resp:  [10-16] 16 (03/02 1547) BP: (116-137)/(60-65) 137/62 (03/02 1547) SpO2:  [100 %] 100 % (03/02 1531) Weight:  [100.2 kg] 100.2 kg (03/02 1540)    General:  Alert male in NAD Psych:  Pleasant, cooperative. Normal mood and affect Eyes: Pupils equal, no icterus. Conjunctive pink Ears:  Normal auditory acuity Nose: No deformity, discharge or lesions Neck:  Supple, no masses felt Lungs:  Clear to auscultation.  Heart:  Regular rate, 1+ BLE edema.  Abdomen:  Soft, nondistended, nontender, active bowel sounds, no masses felt Rectal :  Deferred Msk: Symmetrical without gross deformities.  Neurologic:  Alert, oriented, grossly normal neurologically Skin:  Intact without significant lesions.       Lab Results: Recent Labs    03/16/21 1256  WBC 12.3*  HGB 6.8*  HCT 25.3*  PLT 317   BMET Recent Labs    03/16/21 1256  NA 132*  K 4.1  CL 97*  CO2 23  GLUCOSE 137*  BUN 52*  CREATININE 2.79*  CALCIUM 8.5*   LFTs Recent Labs    03/16/21 1256  PROT 7.0  ALBUMIN 2.9*  AST 26  ALT 14  ALKPHOS 118  BILITOT 1.3*   PT/INR No results for input(s): LABPROT, INR in the last 72 hours. Hepatitis Panel No results for input(s): HEPBSAG, HCVAB, HEPAIGM, HEPBIGM in  the last 72 hours.   . CBC Latest Ref Rng & Units 03/16/2021 05/03/2018 02/12/2017  WBC 4.0 - 10.5 K/uL 12.3(H) 9.1 14.4(H)  Hemoglobin 13.0 - 17.0 g/dL 6.8(LL) 13.2 12.8(L)  Hematocrit 39.0 - 52.0 % 25.3(L) 43.1 38.5(L)  Platelets 150 - 400 K/uL 317 173 179    . CMP Latest Ref Rng & Units 03/16/2021 05/03/2018 04/04/2018  Glucose 70 - 99 mg/dL 137(H) 70 94  BUN 8 - 23 mg/dL 52(H) 25(H) 35(H)  Creatinine 0.61 - 1.24 mg/dL 2.79(H) 2.34(H) 2.55(H)  Sodium 135 - 145 mmol/L 132(L) 139 140  Potassium 3.5 - 5.1 mmol/L 4.1 4.2 4.2  Chloride 98 - 111 mmol/L 97(L) 104 104  CO2 22 - 32 mmol/L 23 23 19(L)  Calcium 8.9 - 10.3 mg/dL 8.5(L) 8.9 8.9  Total Protein 6.5 - 8.1 g/dL 7.0 7.5 -  Total Bilirubin 0.3 - 1.2 mg/dL 1.3(H) 1.9(H) -  Alkaline Phos 38 - 126 U/L 118 98 -  AST 15 - 41 U/L 26 26 -  ALT 0 - 44 U/L 14 15 -     Active Problems:   * No active hospital problems. Tye Savoy, NP-C @  03/16/2021, 4:15 PM

## 2021-03-16 NOTE — ED Provider Triage Note (Signed)
Emergency Medicine Provider Triage Evaluation Note ? ?[name] , a 81 y.o. male  was evaluated in triage.  Pt complains of shortness of breath and weakness.  Was sent by the [facility] for a low Hgb value of 6.8.  History of heart failure, CABG, PAF, diabetes mellitus, CKD. ? ?Review of Systems  ?Positive: Shortness of breath, weakness ?Negative: Fever, chest pain ? ?Physical Exam  ?BP 116/60 (BP Location: Left Arm)   Pulse 77   Temp 97.7 ?F (36.5 ?C) (Oral)   Resp 16   SpO2 100%  ?Gen:   Awake, no distress  ?Resp:  Mildly increased effort, mild wheeze bilaterally ?MSK:   Moves extremities with some difficulty  ?Other:  In wheelchair ? ?Medical Decision Making  ?Medically screening exam initiated at 1:04 PM.  Appropriate orders placed.  Daleon Willinger Auld was informed that the remainder of the evaluation will be completed by another provider, this initial triage assessment does not replace that evaluation, and the importance of remaining in the ED until their evaluation is complete. ? ?Labs, EKG, imaging ordered ?  ?Prince Rome, PA-C ?85/02/77 1313 ? ?

## 2021-03-16 NOTE — Hospital Course (Addendum)
States he feels better today, feels like he has some more energy. Underwent his endoscopy today, stomach is inflamed. Told he was going for a colonoscopy tomorrow. ? ?Per wife, he looks like he is a lot less pale today. ? ?Says that he bleeds a lot more ?______________________________________________ ? ?Was told his blood counts were low and came to ED. ? ?Has been feeling very weak after he had stent placed in January 2023. Per wife, has gotten much worse since then. Has hard time getting out of bed or even just moving around. ? ?Denies any dizziness or lightheadedness. Denies changes in vision, chest pain, palpitations, cough, abdominal pain, changes in stools, hematuria, BRBPR, myalgias. ? ?Does endorse SHOB and weakness ? ?Weakness is a lot worse now. He was not like this before stent placement. Can put him down with one hand behind his back.  ? ?Bowel movements are typically darker in color because he takes iron regularly.  ? ?Had a recent fall where tripped and hit elbow on chair, landed on buttocks. Did not hit head, remembers the entire process of falling and asking wife to help him up. He is currently on eliquis and plavix. Got off of baby aspirin last week. Was on all three after stent placement. Eliquis 98m morning and night. Per wife, when he starts to bleed it just takes a long time for it to stop. ? ?Legs are always swollen, but no worsening noted. Doctor told him to keep legs elevated. ? ?Normal BP at home is usually around 130/80. Since January, it has been going down gradually, as low as 90s but then come back up. Wife worried about bleeding even before today. ? ?Has been feeling very short of breath, mildly at rest but more with exertion. He walks with a walker at home, but now typically takes 4-5 steps before having to take a rest. Unable to go up stairs at home anymore. ? ?

## 2021-03-16 NOTE — ED Triage Notes (Signed)
Pt sent from [facility] for blood transfusion-Hgb checked today there and was 6.7. Pt denies blood in stool or other sources of bleeding, just generalized weakness. On Eliquis and Plavix.  ?

## 2021-03-16 NOTE — ED Provider Notes (Signed)
81 year old male here here with low hemoglobin positive for GI bleed recently placed on triple therapy with Eliquis Plavix and aspirin after CABG.  He has chronic dark tarry stools due to taking iron but was noted to have dropped his hemoglobin significantly.  Patient taken at shift sign off.  I have discussed the case with the hospitalist who admit him.  [facility] is aware and will see the patient this evening.  Patient has been given 1 unit of blood.  Stable throughout ED visit ?  ?Margarita Mail, PA-C ?03/16/21 1550 ? ?  ?Deno Etienne, DO ?03/16/21 1557 ? ?

## 2021-03-16 NOTE — H&P (Addendum)
Date: 03/16/2021               Patient Name:  Edward [name] MRN: [id number]  DOB: Aug 13, 1940 Age / Sex: 81 y.o., male   PCP: [facility], [place] Service: Internal Medicine Teaching Service         Attending Physician: Dr. Jimmye Norman, Elaina Pattee, MD    First Contact: Dr. Rosezetta Schlatter, MD Pager: [phone]  Second Contact: Dr. Virl Axe, MD Pager: [phone]       After Hours (After 5p/  First Contact Pager: [phone]  weekends / holidays): Second Contact Pager: [phone]   PCP:  All care received through [facility]  Chief Complaint: Decreased hemoglobin at cardiologist appointment  History of Present Illness: Edward [name] is a 81 year old male with past medical history of PAF on Eliquis and Plavix, PAD status post right CEA in 2011 and left femoral/popliteal PTA in 2014, stroke, tubular adenoma of colon, CHF, CKD stage III, CAD status post CABG in 2013, left heart cath status post PCI January 2023, dyslipidemia, and T2DM C/B diabetic neuropathy presenting from cardiologist's office after found to have hemoglobin of 6.7.  Patient was told his blood counts were low and came to ED.  He has been feeling very weak after he had coronary stent placed in January 2023. Per wife, has gotten much worse since then. Has hard time getting out of bed or even just moving around. He was not like this before stent placement. Normal BP at home is usually around 130/80. Since January, it has been going down gradually, as low as 90s but then come back up. Wife worried about bleeding even before today.  He has also been feeling very short of breath, mildly at rest but more with exertion. He walks with a walker at home, but now typically takes 4-5 steps before having to take a rest. Unable to go up stairs at home anymore. Had a recent fall where tripped and hit elbow on chair, landed on buttocks. Did not hit head, remembers the entire process of falling and asking wife to help him up.  He is currently on eliquis and plavix. Completed course of post-stent low dose aspirin last week. (Was on DAPT as well as Eliquis (AF) following stent.  Reports his bowel movements are typically darker in color because he takes iron regularly.  His legs are always swollen, but no worsening noted. He has been pale.  Denies any dizziness or lightheadedness. Denies changes in vision, chest pain, palpitations, cough, abdominal pain, changes in stools, hematuria, BRBPR, myalgias.   Home Meds:  Eliquis 5 mg twice daily  Plavix 75 mg Omeprazole QD Ferrous sulfate 325 mg daily Lasix 40 mg every morning and 20 mg every evening Allopurinol 100 mg Atorvastatin 40 mg Coreg 12.5 mg twice daily Cholecalciferol Cyanocobalamin 500 mcg daily Gabapentin 100 mg twice daily and 3 capsules at bedtime as needed neuropathy Lantus 48 units daily isosorbide mononitrate ER 30 mg Loratadine 10 mg Magnesium oxide 420 mg, 2 tablets nightly Daily multivitamin Protonix 40 mg Ozempic  Testosterone 200 mg /mL q. 14 days Valsartan 80 mg Zinc 50 mg    Allergies: Allergies as of 03/16/2021 - Review Complete 03/16/2021  Allergen Reaction Noted   Lipitor [atorvastatin calcium] Other (See Comments) 05/30/2010   Past Medical History:  Diagnosis Date   Anemia    ?   Carotid artery occlusion    Chronic diastolic CHF (congestive heart  failure) ([place]) 04/23/2012   CKD (chronic kidney disease), stage III (HCC)    Claudication (HCC)    Coronary artery disease    s/p CABG 2013   Diabetes ([place])    Diabetic neuropathy (HCC)    mostly feet/ legs   Diverticulosis    Dyslipidemia    Exertional shortness of breath    GERD (gastroesophageal reflux disease)    GI bleed    while on triple anticoag therapy   HTN (hypertension)    Hyperlipidemia    Neuromuscular disorder (HCC)    neuropathy   Pacemaker    PAF (paroxysmal atrial fibrillation) ([place])    s/p MAZE at time of CABG   PVD (peripheral vascular disease)  ([place])    R CEA 2011, prior PTA, left fem-pop 2014   Stroke ([place])    vs TIA   Tubular adenoma of colon    Type II diabetes mellitus ([place])     Family History:  Heart disease and hypertension-both parents Colon cancer-brother  Social History:  Lives at home with wife, family mostly in [place] 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   Physical Exam: Blood pressure 130/64, pulse 84, temperature 97.8 F (36.6 C), temperature source Oral, resp. rate 13, height 5' 10"  (1.778 m), weight 100.2 kg, SpO2 100 %. Physical Exam Vitals reviewed.  Constitutional:      Appearance: He is obese. He is ill-appearing. He is not diaphoretic.  HENT:     Head: Normocephalic and atraumatic.     Nose: Nose normal.     Mouth/Throat:     Mouth: Mucous membranes are dry.     Pharynx: Oropharynx is clear.  Eyes:     Extraocular Movements: Extraocular movements intact.  Cardiovascular:     Rate and Rhythm: Normal rate. Rhythm irregular.     Pulses: Normal pulses.     Heart sounds: No murmur heard.   No friction rub. No gallop.     Comments: Afib, PVCs, and one episode of Vtach present on telemetry Pulmonary:     Effort: Pulmonary effort is normal. No respiratory distress.     Breath sounds: Wheezing present.     Comments: Inspiratory wheezes auscultated Chest:     Chest wall: No tenderness.  Abdominal:     General: Abdomen is flat. Bowel sounds are normal. There is no distension.     Palpations: Abdomen is soft.     Tenderness: There is abdominal tenderness. There is no guarding or rebound.     Comments: Some bilateral lower abdominal/suprapubic TTP, which patient attributes to need to urinate   Musculoskeletal:        General: Tenderness present.     Cervical back: Normal range of motion.     Right lower leg: Edema present.     Left lower leg: Edema present.     Comments: 3+ pitting edema bilaterally. Swelling LLE>RLE. Appearance of venous stasis in LLE with purplish skin discoloration and edema. No sensation in LLE from 2-3 cm below knee; intact sensation above this level. Distal pulses 2+ in RLE, palpable LLE. Poor perfusion of toes bilaterally. R sided great toe with necrotic ulceration. Other toes with dried blood from recent fall.   Skin:  Capillary Refill: Capillary refill takes more than 3 seconds.     Findings: Bruising present.     Comments: Bilateral hands cold to touch with poor perfusion (increased cap refill). BLE warm to touch, with bilateral feet cool to touch. R elbow with dressing in place from wound s/p mechanical fall. L forearm also with bandage.   Neurological:     Mental Status: He is alert and oriented to person, place, and time.     Sensory: Sensory deficit present.     Motor: Weakness present.  Psychiatric:        Mood and Affect: Mood normal.        Behavior: Behavior normal.     EKG: personally reviewed my interpretation is irregularly irregular rhythm, consistent with atrial fibrillation.  Low voltage  CXR: personally reviewed my interpretation is mild blunting of left costophrenic angle consistent with pleural effusion.  Cardiomegaly  Assessment & Plan by Problem: Principal Problem:   Symptomatic anemia Active Problems:   GI bleed  Mr. Gjerde is a 81 year old male with extensive cardiac history, most recently with left heart cath and PCI completed January 2023.  Since then, patient reports progressive weakness and progressively decreasing blood pressure at home.  Patient was seen in cardiologist office today who advised ED visit after discovering hemoglobin 6.7.  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  Also has history of colon polyps and small adenoma. Per GI, plan for EGD tomorrow, as limited in interventional options since patient had Eliquis and Plavix dosages this a.m.; on medications for cardiac history.  If EGD is nonrevealing, will consider surveillance with colonoscopy. - Hemoglobin 6.8 on admission - PT 23, INR 2.0, fecal occult blood positive, APTT 39 - Type and screen and crossmatch prepared. - Patient transfused 1 unit PRBCs -Transfuse for Hgb less than 7.0 or hemodynamic instability - Trend CBC posttransfusion and monitor daily - Patient n.p.o. after midnight, but full liquid diet for the remainder of the day  Acute dyspnea Patient reported progressively worsening resting and exertional dyspnea as he became weak 2/2 worsening anemia; O2 sat 100% on room air. - As needed albuterol nebulizer available - We will continue to monitor respiratory status  Atrial fibrillation Patient taking Eliquis 5 mg twice daily.  Reports medication compliance, with last dose of both medications this morning.  Atrial fibrillation present on telemetry - Hold home anticoagulation to prepare for EGD per GI  CAD status post CABG Left heart cath with PCI Patient initiated on ASA 81 mg and Plavix 75 mg daily x30 days after PCI in January.  Patient completed dual therapy and continued with Plavix alone.  Follows up with cardiology at  - Hold Plavix to prepare for EGD per GI  CKD stage IIIb Baseline creatinine 2.34, creatinine on admission 2.79, on the higher side of normal, likely renal effect of GI bleed. - Avoid nephrotoxic agents  CHF Home meds: Lasix 40 mg every morning and 20 mg q. Evening, Coreg 12.5 mg twice daily, Imdur ER 30 mg daily.  Patient with 3+ pitting edema of bilateral lower extremities. -Patient with compression wrap in place on LLE. - Restart home Lasix 40 mg IV  Peripheral vascular disease Patient with LLE discoloration and left greater than  right swelling.  Patient with compression wraps in place.  Imaging from 01/2021: LE arterial duplex with patent femoral-popliteal bypass past graft; LE ABI and TBI within normal limits bilaterally; LE venous reflux study  with no evidence of DVT in the LLE nor superficial venous reflux in the left short saphenous vein.  Greater saphenous vein reversed for bypass graft - We will repeat lower extremity imaging - Advised to keep compression wraps in place   T2DM Home meds: Lantus 48 units daily and Ozempic.  No recent A1c on file to determine level of control.  Glucose on CMP 137. -A1c pending - SSI - CBG monitoring  Dispo: Admit patient to Inpatient with expected length of stay greater than 2 midnights. Diet: Clear Liquid; NPO at midnight DVT Ppx: SCDs  Signed: Rosezetta Schlatter, MD 03/16/2021, 8:16 PM  Pager: [phone] After 5pm on weekdays and 1pm on weekends: On Call pager: [phone]

## 2021-03-16 NOTE — ED Notes (Signed)
Got patient undressed into a gown on the monitor patient is resting with call bell in reach and family at bedside ?

## 2021-03-17 ENCOUNTER — Observation Stay (HOSPITAL_COMMUNITY): Payer: No Typology Code available for payment source

## 2021-03-17 ENCOUNTER — Encounter (HOSPITAL_COMMUNITY): Payer: Self-pay | Admitting: Internal Medicine

## 2021-03-17 ENCOUNTER — Observation Stay (HOSPITAL_BASED_OUTPATIENT_CLINIC_OR_DEPARTMENT_OTHER): Payer: No Typology Code available for payment source | Admitting: Anesthesiology

## 2021-03-17 ENCOUNTER — Observation Stay (HOSPITAL_COMMUNITY): Payer: No Typology Code available for payment source | Admitting: Anesthesiology

## 2021-03-17 ENCOUNTER — Encounter (HOSPITAL_COMMUNITY): Payer: Federal, State, Local not specified - PPO

## 2021-03-17 ENCOUNTER — Encounter (HOSPITAL_COMMUNITY)
Admission: EM | Disposition: A | Discharge status: Home / Self Care | Payer: Self-pay | Source: Home / Self Care | Attending: Internal Medicine

## 2021-03-17 DIAGNOSIS — D500 Iron deficiency anemia secondary to blood loss (chronic): Secondary | ICD-10-CM

## 2021-03-17 DIAGNOSIS — N183 Chronic kidney disease, stage 3 unspecified: Secondary | ICD-10-CM

## 2021-03-17 DIAGNOSIS — I2510 Atherosclerotic heart disease of native coronary artery without angina pectoris: Secondary | ICD-10-CM | POA: Diagnosis present

## 2021-03-17 DIAGNOSIS — D509 Iron deficiency anemia, unspecified: Secondary | ICD-10-CM

## 2021-03-17 DIAGNOSIS — I130 Hypertensive heart and chronic kidney disease with heart failure and stage 1 through stage 4 chronic kidney disease, or unspecified chronic kidney disease: Secondary | ICD-10-CM | POA: Diagnosis present

## 2021-03-17 DIAGNOSIS — E1122 Type 2 diabetes mellitus with diabetic chronic kidney disease: Secondary | ICD-10-CM | POA: Diagnosis present

## 2021-03-17 DIAGNOSIS — R0600 Dyspnea, unspecified: Secondary | ICD-10-CM

## 2021-03-17 DIAGNOSIS — K219 Gastro-esophageal reflux disease without esophagitis: Secondary | ICD-10-CM | POA: Diagnosis present

## 2021-03-17 DIAGNOSIS — K635 Polyp of colon: Secondary | ICD-10-CM | POA: Diagnosis present

## 2021-03-17 DIAGNOSIS — I5032 Chronic diastolic (congestive) heart failure: Secondary | ICD-10-CM | POA: Diagnosis not present

## 2021-03-17 DIAGNOSIS — Z96652 Presence of left artificial knee joint: Secondary | ICD-10-CM | POA: Diagnosis present

## 2021-03-17 DIAGNOSIS — D649 Anemia, unspecified: Secondary | ICD-10-CM | POA: Diagnosis not present

## 2021-03-17 DIAGNOSIS — D62 Acute posthemorrhagic anemia: Secondary | ICD-10-CM | POA: Diagnosis present

## 2021-03-17 DIAGNOSIS — E1151 Type 2 diabetes mellitus with diabetic peripheral angiopathy without gangrene: Secondary | ICD-10-CM | POA: Diagnosis present

## 2021-03-17 DIAGNOSIS — I480 Paroxysmal atrial fibrillation: Secondary | ICD-10-CM | POA: Diagnosis present

## 2021-03-17 DIAGNOSIS — K2970 Gastritis, unspecified, without bleeding: Secondary | ICD-10-CM

## 2021-03-17 DIAGNOSIS — Z794 Long term (current) use of insulin: Secondary | ICD-10-CM | POA: Diagnosis not present

## 2021-03-17 DIAGNOSIS — K2990 Gastroduodenitis, unspecified, without bleeding: Secondary | ICD-10-CM | POA: Diagnosis not present

## 2021-03-17 DIAGNOSIS — K5730 Diverticulosis of large intestine without perforation or abscess without bleeding: Secondary | ICD-10-CM | POA: Diagnosis present

## 2021-03-17 DIAGNOSIS — Z951 Presence of aortocoronary bypass graft: Secondary | ICD-10-CM | POA: Diagnosis not present

## 2021-03-17 DIAGNOSIS — K922 Gastrointestinal hemorrhage, unspecified: Secondary | ICD-10-CM | POA: Diagnosis not present

## 2021-03-17 DIAGNOSIS — E1152 Type 2 diabetes mellitus with diabetic peripheral angiopathy with gangrene: Secondary | ICD-10-CM

## 2021-03-17 DIAGNOSIS — D72829 Elevated white blood cell count, unspecified: Secondary | ICD-10-CM | POA: Diagnosis present

## 2021-03-17 DIAGNOSIS — Z20822 Contact with and (suspected) exposure to covid-19: Secondary | ICD-10-CM | POA: Diagnosis present

## 2021-03-17 DIAGNOSIS — Z7901 Long term (current) use of anticoagulants: Secondary | ICD-10-CM | POA: Diagnosis not present

## 2021-03-17 DIAGNOSIS — Z8673 Personal history of transient ischemic attack (TIA), and cerebral infarction without residual deficits: Secondary | ICD-10-CM | POA: Diagnosis not present

## 2021-03-17 DIAGNOSIS — I509 Heart failure, unspecified: Secondary | ICD-10-CM

## 2021-03-17 DIAGNOSIS — Z888 Allergy status to other drugs, medicaments and biological substances status: Secondary | ICD-10-CM | POA: Diagnosis not present

## 2021-03-17 DIAGNOSIS — Z7985 Long-term (current) use of injectable non-insulin antidiabetic drugs: Secondary | ICD-10-CM | POA: Diagnosis not present

## 2021-03-17 DIAGNOSIS — Z8249 Family history of ischemic heart disease and other diseases of the circulatory system: Secondary | ICD-10-CM | POA: Diagnosis not present

## 2021-03-17 DIAGNOSIS — I872 Venous insufficiency (chronic) (peripheral): Secondary | ICD-10-CM

## 2021-03-17 DIAGNOSIS — Z79899 Other long term (current) drug therapy: Secondary | ICD-10-CM | POA: Diagnosis not present

## 2021-03-17 DIAGNOSIS — E785 Hyperlipidemia, unspecified: Secondary | ICD-10-CM | POA: Diagnosis present

## 2021-03-17 DIAGNOSIS — E1140 Type 2 diabetes mellitus with diabetic neuropathy, unspecified: Secondary | ICD-10-CM | POA: Diagnosis present

## 2021-03-17 DIAGNOSIS — K2951 Unspecified chronic gastritis with bleeding: Secondary | ICD-10-CM | POA: Diagnosis present

## 2021-03-17 DIAGNOSIS — I4891 Unspecified atrial fibrillation: Secondary | ICD-10-CM | POA: Diagnosis not present

## 2021-03-17 DIAGNOSIS — N1832 Chronic kidney disease, stage 3b: Secondary | ICD-10-CM | POA: Diagnosis present

## 2021-03-17 HISTORY — PX: ESOPHAGOGASTRODUODENOSCOPY (EGD) WITH PROPOFOL: SHX5813

## 2021-03-17 HISTORY — PX: BIOPSY: SHX5522

## 2021-03-17 LAB — COMPREHENSIVE METABOLIC PANEL
ALT: 11 U/L (ref 0–44)
AST: 21 U/L (ref 15–41)
Albumin: 2.7 g/dL — ABNORMAL LOW (ref 3.5–5.0)
Alkaline Phosphatase: 112 U/L (ref 38–126)
Anion gap: 9 (ref 5–15)
BUN: 50 mg/dL — ABNORMAL HIGH (ref 8–23)
CO2: 24 mmol/L (ref 22–32)
Calcium: 8.4 mg/dL — ABNORMAL LOW (ref 8.9–10.3)
Chloride: 98 mmol/L (ref 98–111)
Creatinine, Ser: 2.63 mg/dL — ABNORMAL HIGH (ref 0.61–1.24)
GFR, Estimated: 24 mL/min — ABNORMAL LOW (ref >60)
Glucose, Bld: 112 mg/dL — ABNORMAL HIGH (ref 70–99)
Potassium: 4 mmol/L (ref 3.5–5.1)
Sodium: 131 mmol/L — ABNORMAL LOW (ref 135–145)
Total Bilirubin: 2.3 mg/dL — ABNORMAL HIGH (ref 0.3–1.2)
Total Protein: 6.5 g/dL (ref 6.5–8.1)

## 2021-03-17 LAB — CBC
HCT: 26 % — ABNORMAL LOW (ref 39.0–52.0)
Hemoglobin: 7.5 g/dL — ABNORMAL LOW (ref 13.0–17.0)
MCH: 23.5 pg — ABNORMAL LOW (ref 26.0–34.0)
MCHC: 28.8 g/dL — ABNORMAL LOW (ref 30.0–36.0)
MCV: 81.5 fL (ref 80.0–100.0)
Platelets: 307 10*3/uL (ref 150–400)
RBC: 3.19 MIL/uL — ABNORMAL LOW (ref 4.22–5.81)
RDW: 23.5 % — ABNORMAL HIGH (ref 11.5–15.5)
WBC: 10.6 10*3/uL — ABNORMAL HIGH (ref 4.0–10.5)
nRBC: 0.2 % (ref 0.0–0.2)

## 2021-03-17 LAB — HEMOGLOBIN AND HEMATOCRIT, BLOOD
HCT: 32.5 % — ABNORMAL LOW (ref 39.0–52.0)
Hemoglobin: 9.7 g/dL — ABNORMAL LOW (ref 13.0–17.0)

## 2021-03-17 LAB — GLUCOSE, CAPILLARY: Glucose-Capillary: 110 mg/dL — ABNORMAL HIGH (ref 70–99)

## 2021-03-17 LAB — PREPARE RBC (CROSSMATCH)

## 2021-03-17 SURGERY — ESOPHAGOGASTRODUODENOSCOPY (EGD) WITH PROPOFOL
Anesthesia: Monitor Anesthesia Care

## 2021-03-17 MED ORDER — SODIUM CHLORIDE 0.9 % IV SOLN: INTRAVENOUS | Status: DC

## 2021-03-17 MED ORDER — PEG-KCL-NACL-NASULF-NA ASC-C 100 G PO SOLR
0.5000 | Freq: Once | ORAL | Status: AC
  Administered 2021-03-17: 100 g via ORAL
  Filled 2021-03-17: qty 1

## 2021-03-17 MED ORDER — PROPOFOL 500 MG/50ML IV EMUL
INTRAVENOUS | Status: DC | PRN
  Administered 2021-03-17: 125 ug/kg/min via INTRAVENOUS

## 2021-03-17 MED ORDER — PEG-KCL-NACL-NASULF-NA ASC-C 100 G PO SOLR: 0.5000 | Freq: Once | ORAL | Status: DC

## 2021-03-17 MED ORDER — PROPOFOL 10 MG/ML IV BOLUS: INTRAVENOUS | Status: DC | PRN

## 2021-03-17 MED ORDER — PROPOFOL 10 MG/ML IV BOLUS
INTRAVENOUS | Status: DC | PRN
Start: 2021-03-17 — End: 2021-03-17
  Administered 2021-03-17: 20 mg via INTRAVENOUS

## 2021-03-17 MED ORDER — METOCLOPRAMIDE HCL 5 MG/ML IJ SOLN
10.0000 mg | Freq: Once | INTRAMUSCULAR | Status: AC
Start: 2021-03-17 — End: 2021-03-17
  Administered 2021-03-17: 10 mg via INTRAVENOUS
  Filled 2021-03-17: qty 2

## 2021-03-17 MED ORDER — PHENYLEPHRINE HCL (PRESSORS) 10 MG/ML IV SOLN
INTRAVENOUS | Status: DC | PRN
  Administered 2021-03-17: 80 ug via INTRAVENOUS

## 2021-03-17 MED ORDER — SODIUM CHLORIDE 0.9% IV SOLUTION: Freq: Once | INTRAVENOUS | Status: DC

## 2021-03-17 MED ORDER — METOCLOPRAMIDE HCL 5 MG/ML IJ SOLN
10.0000 mg | Freq: Once | INTRAMUSCULAR | Status: AC
  Administered 2021-03-17: 10 mg via INTRAVENOUS
  Filled 2021-03-17: qty 2

## 2021-03-17 MED ORDER — BISACODYL 5 MG PO TBEC
20.0000 mg | DELAYED_RELEASE_TABLET | Freq: Once | ORAL | Status: AC
  Administered 2021-03-17: 20 mg via ORAL
  Filled 2021-03-17: qty 4

## 2021-03-17 MED ORDER — LIDOCAINE 2% (20 MG/ML) 5 ML SYRINGE
INTRAMUSCULAR | Status: DC | PRN
  Administered 2021-03-17: 80 mg via INTRAVENOUS

## 2021-03-17 SURGICAL SUPPLY — 15 items

## 2021-03-17 NOTE — Transfer of Care (Signed)
Immediate Anesthesia Transfer of Care Note ? ?Patient: Edward [name] ? ?Procedure(s) Performed: ESOPHAGOGASTRODUODENOSCOPY (EGD) WITH PROPOFOL ?BIOPSY ? ?Patient Location: PACU ? ?Anesthesia Type:MAC ? ?Level of Consciousness: awake, patient cooperative and responds to stimulation ? ?Airway & Oxygen Therapy: Patient Spontanous Breathing and Patient connected to nasal cannula oxygen ? ?Post-op Assessment: Report given to RN and Post -op Vital signs reviewed and stable ? ?Post vital signs: Reviewed and stable ? ?Last Vitals:  ?Vitals Value Taken Time  ?BP 101/51 03/17/21 1026  ?Temp    ?Pulse 80 03/17/21 1032  ?Resp 18 03/17/21 1032  ?SpO2 95 % 03/17/21 1032  ?Vitals shown include unvalidated device data. ? ?Last Pain:  ?Vitals:  ? 03/17/21 0936  ?TempSrc: Oral  ?PainSc: 0-No pain  ?   ? ?  ? ?Complications: No notable events documented. ?

## 2021-03-17 NOTE — ED Notes (Signed)
Pt repositioned in the bed and provided with extra blankets. ?

## 2021-03-17 NOTE — Op Note (Addendum)
[facility] ?Patient Name: Edward [name] ?Procedure Date : 03/17/2021 ?MRN: [id number] ?Attending MD: [name] , MD ?Date of Birth: Apr 08, 1940 ?CSN: [id number] ?Age: 81 ?Admit Type: Inpatient ?Procedure:                Upper GI endoscopy ?Indications:              Iron deficiency anemia, Heme positive stool ?Providers:                [name], MD, Allayne Gitelman, RN, Frazier Richards,  ?                          Technician ?Referring MD:              ?Medicines:                Propofol per Anesthesia, Monitored Anesthesia Care ?Complications:            No immediate complications. ?Estimated Blood Loss:     Estimated blood loss was minimal. ?Procedure:                Pre-Anesthesia Assessment: ?                          - Prior to the procedure, a History and Physical  ?                          was performed, and patient medications and  ?                          allergies were reviewed. The patient's tolerance of  ?                          previous anesthesia was also reviewed. The risks  ?                          and benefits of the procedure and the sedation  ?                          options and risks were discussed with the patient.  ?                          All questions were answered, and informed consent  ?                          was obtained. Prior Anticoagulants: The patient  ?                          last took Eliquis (apixaban) 2 days and Plavix  ?                          (clopidogrel) 2 days prior to the procedure. ASA  ?                          Grade Assessment: III - A patient with severe  ?  systemic disease. After reviewing the risks and  ?                          benefits, the patient was deemed in satisfactory  ?                          condition to undergo the procedure. ?                          After obtaining informed consent, the endoscope was  ?                          passed under direct vision. Throughout the  ?                           procedure, the patient's blood pressure, pulse, and  ?                          oxygen saturations were monitored continuously. The  ?                          GIF-[id number] ([id number]) Olympus endoscope was introduced  ?                          through the mouth, and advanced to the second part  ?                          of duodenum. The upper GI endoscopy was  ?                          accomplished without difficulty. The patient  ?                          tolerated the procedure well. ?Scope In: ?Scope Out: ?Findings: ?     Patchy moderate inflammation characterized by erythema, friability and  ?     granularity was found in the gastric antrum. Biopsies were taken with a  ?     cold forceps for histology. Verification of patient identification for  ?     the specimen was done. Estimated blood loss was minimal. ?     Patchy minimal inflammation characterized by erythema was found in the  ?     gastric body. Biopsies were taken with a cold forceps for histology.  ?     Verification of patient identification for the specimen was done.  ?     Estimated blood loss was minimal. ?     The exam was otherwise without abnormality. ?     The cardia and gastric fundus were normal on retroflexion. ?Impression:               - Gastritis. Biopsied. ?                          - Gastritis. Biopsied. similar antral distribution  ?                          as  in 2015 but that was erosive. ?                          - The examination was otherwise normal. ?Recommendation:           - Colonoscopy tomorrow. Though these findings in  ?                          the setting of eliquis, clopidogrel and ASA may  ?                          explain blood loss anemia I am not confident and it  ?                          has been since 2015 that he has had a colonoscopy/ ?                          If colonoscopy unrevealing could consider capsule  ?                          endoscopy but that could likely wait to do as  ?                           outpetient depending clinical course He will need  ?                          Eliquis and Plavix for a while it seems. ?                          Will use bid high dose PPI ?Procedure Code(s):        --- Professional --- ?                          [phone], Esophagogastroduodenoscopy, flexible,  ?                          transoral; with biopsy, single or multiple ?Diagnosis Code(s):        --- Professional --- ?                          K29.70, Gastritis, unspecified, without bleeding ?                          D50.9, Iron deficiency anemia, unspecified ?                          R19.5, Other fecal abnormalities ?CPT copyright 2019 American Medical Association. All rights reserved. ?The codes documented in this report are preliminary and upon coder review may  ?be revised to meet current compliance requirements. ?[name], MD ?03/17/2021 10:35:40 AM ?This report has been signed electronically. ?Number of Addenda: 0 ?

## 2021-03-17 NOTE — Anesthesia Postprocedure Evaluation (Signed)
Anesthesia Post Note ? ?Patient: Edward [name] ? ?Procedure(s) Performed: ESOPHAGOGASTRODUODENOSCOPY (EGD) WITH PROPOFOL ?BIOPSY ? ?  ? ?Patient location during evaluation: Endoscopy ?Anesthesia Type: MAC ?Level of consciousness: awake and alert ?Pain management: pain level controlled ?Vital Signs Assessment: post-procedure vital signs reviewed and stable ?Respiratory status: spontaneous breathing, nonlabored ventilation and respiratory function stable ?Cardiovascular status: stable and blood pressure returned to baseline ?Postop Assessment: no apparent nausea or vomiting ?Anesthetic complications: no ? ? ?No notable events documented. ? ?Last Vitals:  ?Vitals:  ? 03/17/21 1041 03/17/21 1115  ?BP: (!) 114/54 122/62  ?Pulse: 86 74  ?Resp: (!) 21 20  ?Temp: 36.7 ?C   ?SpO2: 98% 100%  ?  ?Last Pain:  ?Vitals:  ? 03/17/21 0936  ?TempSrc: Oral  ?PainSc: 0-No pain  ? ? ?  ?  ?  ?  ?  ?  ? ?Sylver Vantassell,W. EDMOND ? ? ? ? ?

## 2021-03-17 NOTE — Progress Notes (Addendum)
?  Transition of Care (TOC) Screening Note ? ? ?Patient Details  ?Name: Edward [name] ?Date of Birth: 07-Jan-1941 ? ? ?Transition of Care (TOC) CM/SW Contact:    ?Tom-Johnson, Renea Ee, RN ?Phone Number: ?03/17/2021, 12:45 PM ? ?Patient is admitted for Symptomatic Anemia. From home with wife. Currently retired and has one son.Has a walker at home. PCP is at  [facility]. Uses pharmacy at [facility] as well as [facility] on [address]. CM attempted to contact Cyndee Brightly [phone] ex [phone]), SW at [facility] and a secured message left for a return call. Transition of Care Department [facility]) has reviewed patient and no TOC needs r recommendations have been identified at this time. TOC will continue to monitor patient advancement through interdisciplinary progression rounds. If new patient transition needs arise, please place a TOC consult. ?  ?

## 2021-03-17 NOTE — Progress Notes (Addendum)
? ?Subjective: NAEON ? ?Patient endorses feeling better after transfusion yesterday, but still reports feeling weak and dyspneic exertionally and at rest. EGD today redemonstrated chronic erosive gastritis, but revealed no source of bleed. Plan for colonoscopy tomorrow.  ? ?Objective: ? ?Vital signs in last 24 hours: ?Vitals:  ? 03/16/21 2300 03/17/21 0000 03/17/21 0158 03/17/21 0537  ?BP: 127/61 114/69 125/81 123/73  ?Pulse: 71 86 80 90  ?Resp: 17 (!) 22 18 18   ?Temp:   (!) 97.5 ?F (36.4 ?C) 97.8 ?F (36.6 ?C)  ?TempSrc:   Oral   ?SpO2: 100% 100% 100% 100%  ?Weight:      ?Height:      ? ?Physical Exam ?Constitutional:  He is obese and chronically ill-appearing. ?HENT: Normocephalic and atraumatic. Mucous membranes are dry.  ?Cardiovascular: Normal rate. Rhythm irregular. Normal pulses, proximally and distally. No murmur, rub, gallop appreciated. ?Pulmonary: Pulmonary effort is normal. No respiratory distress. Inspiratory wheezes auscultated ?Abdominal: Abdomen is soft and flat. Bowel sounds are normal. There is no distension nor TTP.  ?Musculoskeletal:   3+ pitting edema bilaterally. Swelling LLE>RLE. Appearance of venous stasis in LLE with purplish skin discoloration and edema. Distal pulses 2+ in RLE, 1+ LLE. Poor perfusion of toes bilaterally. R sided great toe with dry ulceration. Other toes with dried blood from recent fall.  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  He maintains a light ACE wrap around the foot and ankle, reporting that the  compression helps with the sharp shooting pains he frequently experiences. ?Skin:  Bilateral hands cold to touch with poor perfusion (increased cap refill). BLE warm to touch. R elbow with dressing in place from wound s/p mechanical fall. L forearm with multiple well-healed scars   ?Neurological: He is alert and oriented to person, place, and time. Sensory deficit present. No sensation in LLE from 2-3 cm below  knee; intact sensation above this level. Weakness present.  ?Psychiatric:    Mood and behavior normal.     ? ?Assessment/Plan: ? ?Principal Problem: ?  Symptomatic anemia ?Active Problems: ?  PAF (paroxysmal atrial fibrillation) ([place]) ?  Diabetes mellitus ([place]) ?  PVD (peripheral vascular disease) with claudication ([place]) ?  Chronic diastolic CHF (congestive heart failure) ([place]) ?  Dyspnea ?  Chronic kidney disease, stage 3b ([place]) ?  Benign essential hypertension ?  GI bleed ? ?Mr. Rozzell is a 81 year old male with extensive cardiac history, most recently with left heart cath and PCI completed January 2023.  Since then, patient reports progressive weakness and progressively decreasing blood pressure at home.  Patient was seen in cardiologist office today who advised ED visit after discovering hemoglobin 6.7. ?  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  Also has history of colon polyps and small adenoma.  EGD with chronic gastritis and no signs of bleed, so will have colonoscopy 3/4. ?- Hemoglobin 6.8 on admission, increased to 7.5 after 1 u pRBC. Will transfuse 1 additional unit pRBCs today as patient still with symptomatic anemia, and dec BP this AM. Transfusion threshold remains for Hgb less than 7.0 or hemodynamic instability. ?Trend CBC posttransfusion and monitor daily ?- PT 23, INR 2.0, fecal occult blood positive, APTT 39 ? Patient remains n.p.o. today and on bowel prep regimen for colonoscopy ?  ?Dyspnea due to severe anemia ?Patient reported progressively worsening resting and exertional dyspnea as he became weak 2/2 worsening anemia; O2 sat  100% on room air. ?- As needed albuterol nebulizer available ?- We will continue to monitor respiratory status ?  ?Atrial fibrillation ?Patient taking Eliquis 5 mg twice daily.  Reports medication compliance, with last dose of both medications this morning.  Atrial fibrillation present on telemetry ?- Hold  home anticoagulation to prepare for EGD and continue to hold for colonoscopy per GI ? ?CAD status post CABG ?Left heart cath with PCI ?Patient initiated on ASA 81 mg and Plavix 75 mg daily x30 days after PCI in January.  Patient completed dual therapy and continued with Plavix alone.  Follows up with cardiology at [facility] ?- Hold Plavix to prepare for EGD and continue to hold for colonoscopy per GI ?  ?CKD stage IIIb ?Baseline creatinine 2.34, creatinine on admission 2.79, on the higher side of normal, likely renal effect of GI bleed. ?- Avoid nephrotoxic agents ? ?CHF ?Home meds: Lasix 40 mg every morning and 20 mg q. Evening, Coreg 12.5 mg twice daily, Imdur ER 30 mg daily.  Patient with 3+ pitting edema of bilateral lower extremities. ?-- Continue Lasix 40 mg IV ?  ?Chronic venous insufficiency ?Patient with LLE discoloration and left greater than right swelling which is chronic.    Imaging from 01/2021: LE arterial duplex with patent femoral-popliteal bypass past graft; LE ABI and TBI within normal limits bilaterally; LE venous reflux study with no evidence of DVT in the LLE nor superficial venous reflux in the left short saphenous vein.  Greater saphenous vein reversed for bypass graft ? ?T2DM ?Home meds: Lantus 48 units daily and Ozempic.  No recent A1c on file to determine level of control.  Glucose on CMP 137 -> 112. ?-A1c 4.6%; obtained post-transfusion, so result likely skewed. ?- CBG monitoring ?  ?Prior to Admission Living Arrangement: Home with wife ?Anticipated Discharge Location: pending ?Barriers to Discharge: PT/OT eval ?Dispo: Anticipated discharge in approximately 2-3 day(s).  ? Rosezetta Schlatter, MD ?03/17/2021, 7:44 AM ?Pager: [phone] ?After 5pm on weekdays and 1pm on weekends: On Call pager [phone]  ?

## 2021-03-17 NOTE — Progress Notes (Signed)
Entered early by mistake but done now ?10:53 AM ? ?

## 2021-03-17 NOTE — Anesthesia Preprocedure Evaluation (Addendum)
Anesthesia Evaluation  ?Patient identified by MRN, date of birth, ID band ?Patient awake ? ? ? ?Reviewed: ?Allergy & Precautions, H&P , NPO status , Patient's Chart, lab work & pertinent test results, reviewed documented beta blocker date and time  ? ?Airway ?Mallampati: III ? ?TM Distance: >3 FB ?Neck ROM: Full ? ? ? Dental ?no notable dental hx. ?(+) Teeth Intact, Dental Advisory Given ?  ?Pulmonary ?neg pulmonary ROS, former smoker,  ?  ?Pulmonary exam normal ?breath sounds clear to auscultation ? ? ? ? ? ? Cardiovascular ?hypertension, Pt. on medications and Pt. on home beta blockers ?+ CAD, + CABG, + Peripheral Vascular Disease and +CHF  ?+ dysrhythmias Atrial Fibrillation + pacemaker  ?Rhythm:Regular Rate:Normal ? ? ?  ?Neuro/Psych ?CVA negative psych ROS  ? GI/Hepatic ?Neg liver ROS, GERD  Medicated,  ?Endo/Other  ?diabetes, Insulin Dependent ? Renal/GU ?Renal disease  ?negative genitourinary ?  ?Musculoskeletal ? ?(+) Arthritis , Osteoarthritis,   ? Abdominal ?  ?Peds ? Hematology ? ?(+) Blood dyscrasia, anemia ,   ?Anesthesia Other Findings ? ? Reproductive/Obstetrics ?negative OB ROS ? ?  ? ? ? ? ? ? ? ? ? ? ? ? ? ?  ?  ? ? ? ? ? ? ? ?Anesthesia Physical ?Anesthesia Plan ? ?ASA: 3 ? ?Anesthesia Plan: MAC  ? ?Post-op Pain Management: Minimal or no pain anticipated  ? ?Induction: Intravenous ? ?PONV Risk Score and Plan: 1 and Propofol infusion ? ?Airway Management Planned: Nasal Cannula and Natural Airway ? ?Additional Equipment:  ? ?Intra-op Plan:  ? ?Post-operative Plan:  ? ?Informed Consent: I have reviewed the patients History and Physical, chart, labs and discussed the procedure including the risks, benefits and alternatives for the proposed anesthesia with the patient or authorized representative who has indicated his/her understanding and acceptance.  ? ? ? ?Dental advisory given ? ?Plan Discussed with: CRNA ? ?Anesthesia Plan Comments:   ? ? ? ? ? ? ?Anesthesia Quick  Evaluation ? ?

## 2021-03-17 NOTE — Interval H&P Note (Signed)
History and Physical Interval Note: ? ?03/17/2021 ?9:37 AM ? ?Edward [name]  has presented today for surgery, with the diagnosis of anemia, hemoccult positive stool.  The various methods of treatment have been discussed with the patient and family. After consideration of risks, benefits and other options for treatment, the patient has consented to  Procedure(s): ?ESOPHAGOGASTRODUODENOSCOPY (EGD) WITH PROPOFOL (N/A) as a surgical intervention.  The patient's history has been reviewed, patient examined, no change in status, stable for surgery.  I have reviewed the patient's chart and labs.  Questions were answered to the patient's satisfaction.   ? ? ?Silvano Rusk ? ? ?

## 2021-03-18 ENCOUNTER — Inpatient Hospital Stay (HOSPITAL_COMMUNITY): Payer: No Typology Code available for payment source | Admitting: Anesthesiology

## 2021-03-18 ENCOUNTER — Encounter (HOSPITAL_COMMUNITY)
Admission: EM | Disposition: A | Discharge status: Home / Self Care | Payer: Self-pay | Source: Home / Self Care | Attending: Internal Medicine

## 2021-03-18 DIAGNOSIS — E1151 Type 2 diabetes mellitus with diabetic peripheral angiopathy without gangrene: Secondary | ICD-10-CM

## 2021-03-18 DIAGNOSIS — K922 Gastrointestinal hemorrhage, unspecified: Secondary | ICD-10-CM

## 2021-03-18 DIAGNOSIS — Z794 Long term (current) use of insulin: Secondary | ICD-10-CM

## 2021-03-18 DIAGNOSIS — K635 Polyp of colon: Secondary | ICD-10-CM

## 2021-03-18 DIAGNOSIS — I5032 Chronic diastolic (congestive) heart failure: Secondary | ICD-10-CM

## 2021-03-18 DIAGNOSIS — K5730 Diverticulosis of large intestine without perforation or abscess without bleeding: Secondary | ICD-10-CM

## 2021-03-18 HISTORY — PX: COLONOSCOPY WITH PROPOFOL: SHX5780

## 2021-03-18 HISTORY — PX: POLYPECTOMY: SHX5525

## 2021-03-18 LAB — TYPE AND SCREEN
ABO/RH(D): O NEG
Antibody Screen: NEGATIVE
Unit division: 0
Unit division: 0

## 2021-03-18 LAB — BPAM RBC
Blood Product Expiration Date: 202303072359
Blood Product Expiration Date: 202303072359
ISSUE DATE / TIME: 202303021512
ISSUE DATE / TIME: 202303031552
Unit Type and Rh: 9500
Unit Type and Rh: 9500

## 2021-03-18 LAB — BASIC METABOLIC PANEL
Anion gap: 10 (ref 5–15)
BUN: 46 mg/dL — ABNORMAL HIGH (ref 8–23)
CO2: 22 mmol/L (ref 22–32)
Calcium: 8.7 mg/dL — ABNORMAL LOW (ref 8.9–10.3)
Chloride: 102 mmol/L (ref 98–111)
Creatinine, Ser: 2.62 mg/dL — ABNORMAL HIGH (ref 0.61–1.24)
GFR, Estimated: 24 mL/min — ABNORMAL LOW (ref >60)
Glucose, Bld: 127 mg/dL — ABNORMAL HIGH (ref 70–99)
Potassium: 5.1 mmol/L (ref 3.5–5.1)
Sodium: 134 mmol/L — ABNORMAL LOW (ref 135–145)

## 2021-03-18 LAB — GLUCOSE, CAPILLARY: Glucose-Capillary: 120 mg/dL — ABNORMAL HIGH (ref 70–99)

## 2021-03-18 LAB — CBC
HCT: 33.1 % — ABNORMAL LOW (ref 39.0–52.0)
Hemoglobin: 9.6 g/dL — ABNORMAL LOW (ref 13.0–17.0)
MCH: 23.6 pg — ABNORMAL LOW (ref 26.0–34.0)
MCHC: 29 g/dL — ABNORMAL LOW (ref 30.0–36.0)
MCV: 81.5 fL (ref 80.0–100.0)
Platelets: 373 10*3/uL (ref 150–400)
RBC: 4.06 MIL/uL — ABNORMAL LOW (ref 4.22–5.81)
RDW: 23.3 % — ABNORMAL HIGH (ref 11.5–15.5)
WBC: 15.2 10*3/uL — ABNORMAL HIGH (ref 4.0–10.5)
nRBC: 0.3 % — ABNORMAL HIGH (ref 0.0–0.2)

## 2021-03-18 SURGERY — COLONOSCOPY WITH PROPOFOL
Anesthesia: Monitor Anesthesia Care

## 2021-03-18 MED ORDER — SODIUM CHLORIDE 0.9 % IV SOLN: INTRAVENOUS | Status: DC | PRN

## 2021-03-18 MED ORDER — LIDOCAINE 2% (20 MG/ML) 5 ML SYRINGE
INTRAMUSCULAR | Status: DC | PRN
  Administered 2021-03-18: 30 mg via INTRAVENOUS

## 2021-03-18 MED ORDER — PROPOFOL 500 MG/50ML IV EMUL
INTRAVENOUS | Status: DC | PRN
Start: 2021-03-18 — End: 2021-03-18
  Administered 2021-03-18: 100 ug/kg/min via INTRAVENOUS

## 2021-03-18 MED ORDER — PROPOFOL 10 MG/ML IV BOLUS
INTRAVENOUS | Status: DC | PRN
  Administered 2021-03-18: 30 mg via INTRAVENOUS

## 2021-03-18 SURGICAL SUPPLY — 22 items

## 2021-03-18 NOTE — Anesthesia Procedure Notes (Signed)
Procedure Name: Adel ?Date/Time: 03/18/2021 10:35 AM ?Performed by: Eligha Bridegroom, CRNA ?Pre-anesthesia Checklist: Patient identified, Emergency Drugs available, Suction available, Patient being monitored and Timeout performed ?Patient Re-evaluated:Patient Re-evaluated prior to induction ?Oxygen Delivery Method: Nasal cannula ?Preoxygenation: Pre-oxygenation with 100% oxygen ?Induction Type: IV induction ? ? ? ? ?

## 2021-03-18 NOTE — Op Note (Signed)
[facility] ?Patient Name: Edward [name] ?Procedure Date : 03/18/2021 ?MRN: [id number] ?Attending MD: Carol Ada , MD ?Date of Birth: 1940-09-03 ?CSN: [id number] ?Age: 81 ?Admit Type: Inpatient ?Procedure:                Colonoscopy ?Indications:              Iron deficiency anemia ?Providers:                Carol Ada, MD, Elmer Ramp. Tilden Dome, RN, [place]  ?                          [place], Technician ?Referring MD:              ?Medicines:                Propofol per Anesthesia ?Complications:            No immediate complications. ?Estimated Blood Loss:     Estimated blood loss: none. ?Procedure:                Pre-Anesthesia Assessment: ?                          - Prior to the procedure, a History and Physical  ?                          was performed, and patient medications and  ?                          allergies were reviewed. The patient's tolerance of  ?                          previous anesthesia was also reviewed. The risks  ?                          and benefits of the procedure and the sedation  ?                          options and risks were discussed with the patient.  ?                          All questions were answered, and informed consent  ?                          was obtained. Prior Anticoagulants: The patient has  ?                          taken no previous anticoagulant or antiplatelet  ?                          agents. ASA Grade Assessment: III - A patient with  ?                          severe systemic disease. After reviewing the risks  ?  and benefits, the patient was deemed in  ?                          satisfactory condition to undergo the procedure. ?                          - Sedation was administered by an anesthesia  ?                          professional. Deep sedation was attained. ?                          After obtaining informed consent, the colonoscope  ?                          was passed under direct vision. Throughout the   ?                          procedure, the patient's blood pressure, pulse, and  ?                          oxygen saturations were monitored continuously. The  ?                          CF-HQ190L ([id number]) Olympus coloscope was  ?                          introduced through the anus and advanced to the the  ?                          terminal ileum. The colonoscopy was performed  ?                          without difficulty. The patient tolerated the  ?                          procedure well. The quality of the bowel  ?                          preparation was evaluated using the [facility]  ?                          Bowel Preparation Scale) with scores of: Right  ?                          Colon = 2 (minor amount of residual staining, small  ?                          fragments of stool and/or opaque liquid, but mucosa  ?                          seen well), Transverse Colon = 2 (minor amount of  ?  residual staining, small fragments of stool and/or  ?                          opaque liquid, but mucosa seen well) and Left Colon  ?                          = 2 (minor amount of residual staining, small  ?                          fragments of stool and/or opaque liquid, but mucosa  ?                          seen well). The total BBPS score equals 6. The  ?                          quality of the bowel preparation was good. The  ?                          ileocecal valve, appendiceal orifice, and rectum  ?                          were photographed. ?Scope In: 10:36:55 AM ?Scope Out: 10:54:38 AM ?Scope Withdrawal Time: 0 hours 12 minutes 11 seconds  ?Total Procedure Duration: 0 hours 17 minutes 43 seconds  ?Findings: ?     Two sessile polyps were found in the transverse colon. The polyps were 2  ?     to 3 mm in size. These polyps were removed with a cold snare. Resection  ?     was complete, but the polyp tissue was only partially retrieved. ?     Scattered small and large-mouthed  diverticula were found in the entire  ?     colon. ?Impression:               - Two 2 to 3 mm polyps in the transverse colon,  ?                          removed with a cold snare. Complete resection.  ?                          Partial retrieval. ?                          - Diverticulosis in the entire examined colon. ?Recommendation:           - Patient has a contact number available for  ?                          emergencies. The signs and symptoms of potential  ?                          delayed complications were discussed with the  ?                          patient. Return to normal activities tomorrow.  ?  Written discharge instructions were provided to the  ?                          patient. ?                          - Resume previous diet. ?                          - Continue present medications. ?                          - Await pathology results. ?                          - No routine repeat colonoscopy with the current  ?                          findings and his comorbidities. ?                          - Follow up with [facility] upon discharge. ?Procedure Code(s):        --- Professional --- ?                          [phone], Colonoscopy, flexible; with removal of  ?                          tumor(s), polyp(s), or other lesion(s) by snare  ?                          technique ?Diagnosis Code(s):        --- Professional --- ?                          K63.5, Polyp of colon ?                          D50.9, Iron deficiency anemia, unspecified ?                          K57.30, Diverticulosis of large intestine without  ?                          perforation or abscess without bleeding ?CPT copyright 2019 American Medical Association. All rights reserved. ?The codes documented in this report are preliminary and upon coder review may  ?be revised to meet current compliance requirements. ?Carol Ada, MD ?Carol Ada, MD ?03/18/2021 11:12:43 AM ?This report has been signed  electronically. ?Number of Addenda: 0 ?

## 2021-03-18 NOTE — Anesthesia Postprocedure Evaluation (Signed)
Anesthesia Post Note ? ?Patient: Edward [name] ? ?Procedure(s) Performed: COLONOSCOPY WITH PROPOFOL ?POLYPECTOMY ? ?  ? ?Patient location during evaluation: Endoscopy ?Anesthesia Type: MAC ?Level of consciousness: awake and alert ?Pain management: pain level controlled ?Vital Signs Assessment: post-procedure vital signs reviewed and stable ?Respiratory status: spontaneous breathing, nonlabored ventilation, respiratory function stable and patient connected to nasal cannula oxygen ?Cardiovascular status: stable and blood pressure returned to baseline ?Postop Assessment: no apparent nausea or vomiting ?Anesthetic complications: no ? ? ?No notable events documented. ? ?Last Vitals:  ?Vitals:  ? 03/18/21 1114 03/18/21 1121  ?BP: (!) 122/51 (!) 127/52  ?Pulse: 91 87  ?Resp: 15 16  ?Temp:    ?SpO2: 99% 100%  ?  ?Last Pain:  ?Vitals:  ? 03/18/21 1121  ?TempSrc:   ?PainSc: 0-No pain  ? ? ?  ?  ?  ?  ?  ?  ? ?March Rummage Murle Hellstrom ? ? ? ? ?

## 2021-03-18 NOTE — Anesthesia Preprocedure Evaluation (Signed)
Anesthesia Evaluation  ?Patient identified by MRN, date of birth, ID band ?Patient awake ? ? ? ?Reviewed: ?Allergy & Precautions, NPO status , Patient's Chart, lab work & pertinent test results ? ?Airway ?Mallampati: II ? ?TM Distance: >3 FB ?Neck ROM: Full ? ? ? Dental ?no notable dental hx. ? ?  ?Pulmonary ?neg pulmonary ROS, former smoker,  ?  ?Pulmonary exam normal ? ? ? ? ? ? ? Cardiovascular ?hypertension, Pt. on medications and Pt. on home beta blockers ?+ CAD, + CABG (2013), + Peripheral Vascular Disease and +CHF  ?+ dysrhythmias (on Eliquis) Atrial Fibrillation + pacemaker  ?Rhythm:Irregular Rate:Normal ? ? ?  ?Neuro/Psych ?CVA negative psych ROS  ? GI/Hepatic ?Neg liver ROS, GERD  Medicated,  ?Endo/Other  ?diabetes, Type 2, Insulin Dependent ? Renal/GU ?CRFRenal disease  ?negative genitourinary ?  ?Musculoskeletal ? ?(+) Arthritis , Osteoarthritis,   ? Abdominal ?Normal abdominal exam  (+)   ?Peds ? Hematology ? ?(+) Blood dyscrasia, anemia ,   ?Anesthesia Other Findings ? ? Reproductive/Obstetrics ? ?  ? ? ? ? ? ? ? ? ? ? ? ? ? ?  ?  ? ? ? ? ? ? ? ? ?Anesthesia Physical ?Anesthesia Plan ? ?ASA: 3 ? ?Anesthesia Plan: MAC  ? ?Post-op Pain Management:   ? ?Induction: Intravenous ? ?PONV Risk Score and Plan: 1 and Propofol infusion, Treatment may vary due to age or medical condition and TIVA ? ?Airway Management Planned: Simple Face Mask, Natural Airway and Nasal Cannula ? ?Additional Equipment: None ? ?Intra-op Plan:  ? ?Post-operative Plan:  ? ?Informed Consent:  ? ?Plan Discussed with:  ? ?Anesthesia Plan Comments: (Lab Results ?     Component                Value               Date                 ?     WBC                      15.2 (H)            03/18/2021           ?     HGB                      9.6 (L)             03/18/2021           ?     HCT                      33.1 (L)            03/18/2021           ?     MCV                      81.5                03/18/2021            ?     PLT                      373                 03/18/2021           ?  Lab Results ?     Component                Value               Date                 ?     NA                       134 (L)             03/18/2021           ?     K                        5.1                 03/18/2021           ?     CO2                      22                  03/18/2021           ?     GLUCOSE                  127 (H)             03/18/2021           ?     BUN                      46 (H)              03/18/2021           ?     CREATININE               2.62 (H)            03/18/2021           ?     CALCIUM                  8.7 (L)             03/18/2021           ?     GFRNONAA                 24 (L)              03/18/2021          )  ? ? ? ? ? ? ?Anesthesia Quick Evaluation ? ?

## 2021-03-18 NOTE — Evaluation (Signed)
Physical Therapy Evaluation ?Patient Details ?Name: Edward [name] ?MRN: [id number] ?DOB: 06/24/1940 ?Today's Date: 03/18/2021 ? ?History of Present Illness ? 81 y/o male presented to ED on 03/16/21 after being sent from [place] for low Hgb of 6.7. EGD showed gastritis. Colonscopy showed 2 polyps in transverse colon which were removed. 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 Patient admitted with above. Patient presents with generalized weakness, decreased activity tolerance, and impaired balance. Patient required encouragement to participate with therapy but overall moving well. Seems like wife assists him at home for daily activities. Patient was ambulating with rollator PTA. Patient now requires minA for bed mobility and supervision for ambulation with RW and stair negotiation for safety. Patient will benefit from skilled PT services during acute stay to address listed deficits. Recommend OPPT at discharge to address balance and strength deficits.    ?   ? ?Recommendations for follow up therapy are one component of a multi-disciplinary discharge planning process, led by the attending physician.  Recommendations may be updated based on patient status, additional functional criteria and insurance authorization. ? ?Follow Up Recommendations Outpatient PT ? ?  ?Assistance Recommended at Discharge Intermittent Supervision/Assistance  ?Patient can return home with the following ?   ? ?  ?Equipment Recommendations None recommended by PT  ?Recommendations for Other Services ?    ?  ?Functional Status Assessment Patient has had a recent decline in their functional status and demonstrates the ability to make significant improvements in function in a reasonable and predictable amount of time.  ? ?  ?Precautions / Restrictions Precautions ?Precautions: Fall ?Restrictions ?Weight Bearing Restrictions: No   ? ?  ? ?Mobility ? Bed Mobility ?Overal bed mobility: Needs Assistance ?Bed Mobility: Supine to Sit, Sit to Supine ?  ?  ?Supine to sit: Min assist ?Sit to supine: Min assist ?  ?General bed mobility comments: minA for trunk elevation and bringing LEs back onto bed ?  ? ?Transfers ?Overall transfer level: Needs assistance ?Equipment used: Rolling Mahealani Sulak (2 wheels) ?Transfers: Sit to/from Stand ?Sit to Stand: Min guard ?  ?  ?  ?  ?  ?General transfer comment: min guard for safety. Poor hand placement but does not respond well to cues ?  ? ?Ambulation/Gait ?Ambulation/Gait assistance: Supervision ?Gait Distance (Feet): 100 Feet ?Assistive device: Rolling Shantavia Jha (2 wheels) ?Gait Pattern/deviations: Step-through pattern, Decreased stride length, Trunk flexed ?Gait velocity: decreased ?  ?  ?General Gait Details: supervision for safety. No overt LOB noted ? ?Stairs ?Stairs: Yes ?Stairs assistance: Supervision ?Stair Management: Two rails, Step to pattern, Forwards ?Number of Stairs: 4 ?  ? ?Wheelchair Mobility ?  ? ?Modified Rankin (Stroke Patients Only) ?  ? ?  ? ?Balance Overall balance assessment: Needs assistance ?Sitting-balance support: No upper extremity supported, Feet supported ?Sitting balance-Leahy Scale: Fair ?  ?  ?Standing balance support: Bilateral upper extremity supported, Reliant on assistive device for balance ?Standing balance-Leahy Scale: Poor ?Standing balance comment: reliant on UE support ?  ?  ?  ?  ?  ?  ?  ?  ?  ?  ?  ?   ? ? ? ?Pertinent Vitals/Pain Pain Assessment ?Pain Assessment: Faces ?Faces Pain Scale: No hurt ?Pain Intervention(s): Monitored during session  ? ? ?Home Living Family/patient expects to be discharged to:: Private residence ?Living Arrangements: Spouse/significant other ?Available Help at Discharge: Family ?Type of  Home: House ?Home Access: Stairs to enter ?Entrance Stairs-Rails: Can reach both ?Entrance Stairs-Number of Steps: 5 ?  ?Home Layout: Two level;Able to live on  main level with bedroom/bathroom ?Home Equipment: Rollator (4 wheels);Cane - single Barista (2 wheels) ?   ?  ?Prior Function Prior Level of Function : Independent/Modified Independent;History of Falls (last six months) ?  ?  ?  ?  ?  ?  ?Mobility Comments: using the rollator for mobility ?  ?  ? ? ?Hand Dominance  ?   ? ?  ?Extremity/Trunk Assessment  ? Upper Extremity Assessment ?Upper Extremity Assessment: Generalized weakness ?  ? ?Lower Extremity Assessment ?Lower Extremity Assessment: Generalized weakness ?  ? ?Cervical / Trunk Assessment ?Cervical / Trunk Assessment: Kyphotic  ?Communication  ? Communication: No difficulties  ?Cognition Arousal/Alertness: Awake/alert ?Behavior During Therapy: Agitated ?Overall Cognitive Status: Within Functional Limits for tasks assessed ?  ?  ?  ?  ?  ?  ?  ?  ?  ?  ?  ?  ?  ?  ?  ?  ?  ?  ?  ? ?  ?General Comments   ? ?  ?Exercises    ? ?Assessment/Plan  ?  ?PT Assessment Patient needs continued PT services  ?PT Problem List Decreased strength;Decreased activity tolerance;Decreased balance;Decreased mobility;Decreased safety awareness ? ?   ?  ?PT Treatment Interventions DME instruction;Gait training;Therapeutic activities;Therapeutic exercise;Balance training;Functional mobility training;Stair training;Patient/family education   ? ?PT Goals (Current goals can be found in the Care Plan section)  ?Acute Rehab PT Goals ?Patient Stated Goal: to go home ?PT Goal Formulation: With patient/family ?Time For Goal Achievement: 04/01/21 ?Potential to Achieve Goals: Good ? ?  ?Frequency Min 3X/week ?  ? ? ?Co-evaluation   ?  ?  ?  ?  ? ? ?  ?AM-PAC PT "6 Clicks" Mobility  ?Outcome Measure Help needed turning from your back to your side while in a flat bed without using bedrails?: A Little ?Help needed moving from lying on your back to sitting on the side of a flat bed without using bedrails?: A Little ?Help needed moving to and from a bed to a chair (including a  wheelchair)?: A Little ?Help needed standing up from a chair using your arms (e.g., wheelchair or bedside chair)?: A Little ?Help needed to walk in hospital room?: A Little ?Help needed climbing 3-5 steps with a railing? : A Little ?6 Click Score: 18 ? ?  ?End of Session   ?Activity Tolerance: Patient tolerated treatment well ?Patient left: in bed;with call bell/phone within reach;with family/visitor present ?Nurse Communication: Mobility status ?PT Visit Diagnosis: Unsteadiness on feet (R26.81);History of falling (Z91.81);Muscle weakness (generalized) (M62.81) ?  ? ?Time: 9518-8416 ?PT Time Calculation (min) (ACUTE ONLY): 32 min ? ? ?Charges:   PT Evaluation ?$PT Eval Moderate Complexity: 1 Mod ?PT Treatments ?$Therapeutic Activity: 8-22 mins ?  ?   ? ? ?Jareth Pardee A. Gilford Rile, PT, DPT ?Acute Rehabilitation Services ?Pager [phone] ?Office [phone] ? ? ?Darlisa Spruiell A Syon Tews ?03/18/2021, 4:42 PM ? ?

## 2021-03-18 NOTE — Transfer of Care (Signed)
Immediate Anesthesia Transfer of Care Note ? ?Patient: Edward [name] ? ?Procedure(s) Performed: COLONOSCOPY WITH PROPOFOL ?POLYPECTOMY ? ?Patient Location: PACU ? ?Anesthesia Type:MAC ? ?Level of Consciousness: awake and alert  ? ?Airway & Oxygen Therapy: Patient Spontanous Breathing ? ?Post-op Assessment: Report given to RN and Post -op Vital signs reviewed and stable ? ?Post vital signs: Reviewed and stable ? ?Last Vitals:  ?Vitals Value Taken Time  ?BP 104/45 03/18/21 1104  ?Temp 36.5 ?C 03/18/21 1104  ?Pulse 91 03/18/21 1104  ?Resp 20 03/18/21 1100  ?SpO2 98 % 03/18/21 1104  ? ? ?Last Pain:  ?Vitals:  ? 03/18/21 1104  ?TempSrc: Oral  ?PainSc:   ?   ? ?  ? ?Complications: No notable events documented. ?

## 2021-03-18 NOTE — Progress Notes (Addendum)
? ?Subjective:  ? ?No acute overnight events. ? ?Doing well this morning. Awaiting colonoscopy. States that he is ready to go home once all of this is done. Feels a lot better in terms of strength, but still feels weaker than in the past. ? ?Notes that he has periodic BRBPR from diverticulosis (last episode was 1 month ago). Typically has 1 or 2 BMs with bright red blood but this goes away on its own. ? ?Objective: ? ?Vital signs in last 24 hours: ?Vitals:  ? 03/18/21 1100 03/18/21 1104 03/18/21 1114 03/18/21 1121  ?BP: (!) 96/33 (!) 104/45 (!) 122/51 (!) 127/52  ?Pulse: 88 91 91 87  ?Resp: 20  15 16   ?Temp:  97.7 ?F (36.5 ?C)    ?TempSrc:  Oral    ?SpO2: 100% 98% 99% 100%  ?Weight:      ?Height:      ? ?Physical Exam ?General: Pleasant, elderly gentleman, lying in bed, NAD. ?CV: normal rate and irregular rhythm. No m/r/g.  ?Pulm: CTABL, no respiratory distress noted. ?Abdomen: soft, nondistended, nontender to palpation. Normoactive bowel sounds. ?MSK: 1-2+ pitting edema bilaterally with chronic venous stasis changes in LLE.  R great with dry ulceration. 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  ?Skin: bilateral hands cool to touch, R elbow with dressing in place. L forearm with multiple healed scars. ?Neuro: AAOx3, no sensation in LLE from about an inch below knee. ? ? ?Assessment/Plan: ? ?Principal Problem: ?  Symptomatic anemia ?Active Problems: ?  PAF (paroxysmal atrial fibrillation) ([place]) ?  Diabetes mellitus ([place]) ?  PVD (peripheral vascular disease) with claudication ([place]) ?  Chronic diastolic CHF (congestive heart failure) ([place]) ?  Dyspnea ?  Chronic kidney disease, stage 3b ([place]) ?  Benign essential hypertension ?  GI bleed ?  Gastritis and gastroduodenitis ? ?Edward [name] is a 81 year old male with extensive cardiac history, most recently with left heart cath and PCI completed January 2023, presenting with low hemoglobin noted at PCP, now undergoing workup for symptomatic anemia likely  2/2 GI bleed. ?  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  Also has history of colon polyps and small adenoma.  GI following. EGD with chronic gastritis and no signs of bleed. S/p 2u pRBCs with Hgb 9.6 this AM. Fatigue vastly improved after transfusions. Colonoscopy this AM revealing 2-66m polyps in transverse colon s/p cold snare removal and widespread diverticulosis in colon.  ?-appreciate GI assistance ?-resume diet ?-f/u pathology results ?-f/u morning CBC ?-will need outpatient GI f/u ?-PT/OT eval ?  ?Atrial fibrillation ?Patient taking Eliquis 5 mg twice daily.  Reports medication compliance.  Atrial fibrillation present on telemetry ?- Holding home anticoagulation due to above ? ?CAD status post CABG ?Left heart cath with PCI ?Patient initiated on ASA 81 mg and Plavix 75 mg daily x30 days after PCI in January.  Patient completed dual therapy and continued with Plavix alone. [facility]?- Holding home plavix due to above ?  ?CKD stage IIIb ?Baseline creatinine 2.34 about 2 years ago. Cr since admission between 2.6-2.8, possibly new baseline. ?- Avoid nephrotoxic agents ? ?CHF ?Home meds: Lasix 40 mg every morning and 20 mg q. Evening, Coreg 12.5 mg twice daily, Imdur ER 30 mg daily.  Patient with 1-2+ pitting edema bilaterally. ?-- Continue Lasix 40 mg IV for one more day ?  ?Chronic venous insufficiency ?Patient with LLE discoloration and left  greater than right swelling which is chronic.    Imaging from 01/2021: LE arterial duplex with patent femoral-popliteal bypass past graft; LE ABI and TBI within normal limits bilaterally; LE venous reflux study with no evidence of DVT in the LLE nor superficial venous reflux in the left short saphenous vein.  Greater saphenous vein reversed for bypass graft ? ?T2DM ?Home meds: Lantus 48 units daily and Ozempic.  No recent A1c on file to determine level of control.  ?-A1c  4.6%; obtained post-transfusion, so result likely skewed. ?- CBG monitoring ?  ?Prior to Admission Living Arrangement: Home with wife ?Anticipated Discharge Location: pending ?Barriers to Discharge: PT/OT eval, continued medical management ?Dispo: Anticipated discharge in approximately 1-2 day(s).  ? ?Edward Axe, MD ?03/18/2021, 11:28 AM ?Pager: [phone] ?After 5pm on weekdays and 1pm on weekends: On Call pager [phone]  ?

## 2021-03-18 NOTE — Plan of Care (Signed)
  Problem: Activity: Goal: Ability to tolerate increased activity will improve Outcome: Progressing   

## 2021-03-19 ENCOUNTER — Encounter (HOSPITAL_COMMUNITY): Payer: Self-pay | Admitting: Internal Medicine

## 2021-03-19 LAB — BASIC METABOLIC PANEL
Anion gap: 11 (ref 5–15)
BUN: 37 mg/dL — ABNORMAL HIGH (ref 8–23)
CO2: 21 mmol/L — ABNORMAL LOW (ref 22–32)
Calcium: 8.3 mg/dL — ABNORMAL LOW (ref 8.9–10.3)
Chloride: 103 mmol/L (ref 98–111)
Creatinine, Ser: 2.65 mg/dL — ABNORMAL HIGH (ref 0.61–1.24)
GFR, Estimated: 24 mL/min — ABNORMAL LOW (ref >60)
Glucose, Bld: 156 mg/dL — ABNORMAL HIGH (ref 70–99)
Potassium: 4 mmol/L (ref 3.5–5.1)
Sodium: 135 mmol/L (ref 135–145)

## 2021-03-19 LAB — CBC
HCT: 28.9 % — ABNORMAL LOW (ref 39.0–52.0)
HCT: 28.6 % — ABNORMAL LOW (ref 39.0–52.0)
Hemoglobin: 8.5 g/dL — ABNORMAL LOW (ref 13.0–17.0)
Hemoglobin: 8.3 g/dL — ABNORMAL LOW (ref 13.0–17.0)
MCH: 24.4 pg — ABNORMAL LOW (ref 26.0–34.0)
MCH: 23.8 pg — ABNORMAL LOW (ref 26.0–34.0)
MCHC: 29.4 g/dL — ABNORMAL LOW (ref 30.0–36.0)
MCHC: 29 g/dL — ABNORMAL LOW (ref 30.0–36.0)
MCV: 82.8 fL (ref 80.0–100.0)
MCV: 81.9 fL (ref 80.0–100.0)
Platelets: 339 10*3/uL (ref 150–400)
Platelets: 313 10*3/uL (ref 150–400)
RBC: 3.49 MIL/uL — ABNORMAL LOW (ref 4.22–5.81)
RBC: 3.49 MIL/uL — ABNORMAL LOW (ref 4.22–5.81)
RDW: 23.2 % — ABNORMAL HIGH (ref 11.5–15.5)
RDW: 23 % — ABNORMAL HIGH (ref 11.5–15.5)
WBC: 12.2 10*3/uL — ABNORMAL HIGH (ref 4.0–10.5)
WBC: 11.7 10*3/uL — ABNORMAL HIGH (ref 4.0–10.5)
nRBC: 0 % (ref 0.0–0.2)
nRBC: 0 % (ref 0.0–0.2)

## 2021-03-19 MED ORDER — FUROSEMIDE 40 MG PO TABS: 80.0000 mg | ORAL_TABLET | Freq: Two times a day (BID) | ORAL | Status: DC

## 2021-03-19 MED ORDER — FUROSEMIDE 40 MG PO TABS: 40.0000 mg | ORAL_TABLET | Freq: Two times a day (BID) | ORAL | Status: DC

## 2021-03-19 NOTE — Discharge Summary (Signed)
Name: Edward [name] MRN: [id number] DOB: [birth year] 81 y.o. PCP: [facility]  Date of Admission: 03/16/2021 12:35 PM Date of Discharge: 03/19/2021  Attending Physician: Angelica Pou, MD  Discharge Diagnosis: 1.  Symptomatic anemia 2/2 GI bleed 2.  Chronic diastolic heart failure 3.  CKD 3B 4.  CAD status post CABG, LHC with PCI 5.  Atrial fibrillation  Discharge Medications: Allergies as of 03/19/2021       Reactions   Lipitor [atorvastatin Calcium] Other (See Comments)   Weakness/ pain in legs        Medication List     STOP taking these medications    MAGNESIUM PO       TAKE these medications    acetaminophen 650 MG CR tablet Commonly known as: TYLENOL Take 650 mg by mouth every 8 (eight) hours as needed for pain.   albuterol 108 (90 Base) MCG/ACT inhaler Commonly known as: VENTOLIN HFA USE 2 PUFFS EVERY 4 HOURS AS NEEDED FOR WHEEZING What changed: See the new instructions.   allopurinol 100 MG tablet Commonly known as: ZYLOPRIM Take 100 mg by mouth daily.   apixaban 2.5 MG Tabs tablet Commonly known as: ELIQUIS Take 1.25 mg by mouth 2 (two) times daily.   blood glucose meter kit and supplies Dispense based on patient and insurance preference. Use up to four times daily as directed. (FOR ICD-10 E10.9, E11.9).   carvedilol 6.25 MG tablet Commonly known as: COREG Take 1 tablet (6.25 mg total) by mouth 2 (two) times daily.   cholecalciferol 1000 units tablet Commonly known as: VITAMIN D Take 1,000 Units by mouth daily.   Cyanocobalamin 500 MCG Tbdp Take 1 tablet by mouth daily.   ferrous sulfate 325 (65 FE) MG tablet Take 325 mg by mouth daily with breakfast.   furosemide 40 MG tablet Commonly known as: LASIX TAKE 2 TABLETS BY MOUTH EVERY MORNING AND TAKE 1 TABLET IN THE AFTERNOON What changed:  how much to take how to take this when to take this additional instructions   gabapentin 100 MG capsule Commonly known as:  NEURONTIN Take 100 mg by mouth 2 (two) times daily.   insulin glargine 100 UNIT/ML injection Commonly known as: LANTUS Inject 48 Units into the skin daily.   loratadine 10 MG tablet Commonly known as: CLARITIN Take 10 mg by mouth daily.   multivitamin capsule Take 1 capsule by mouth daily.   omeprazole 20 MG capsule Commonly known as: PRILOSEC Take 20 mg by mouth 2 (two) times daily.   Potassium 99 MG Tabs Take 99 mg by mouth daily.   rosuvastatin 20 MG tablet Commonly known as: CRESTOR Take 20 mg by mouth daily.   Semaglutide (1 MG/DOSE) 2 MG/1.5ML Sopn Inject 1 mg into the skin every Sunday.   sildenafil 50 MG tablet Commonly known as: VIAGRA May take 1-2 tablets as needed for sexual intercourse What changed:  how much to take how to take this when to take this reasons to take this   spironolactone 25 MG tablet Commonly known as: ALDACTONE Take 0.5 tablets (12.5 mg total) by mouth daily.   testosterone cypionate 200 MG/ML injection Commonly known as: DEPOTESTOSTERONE CYPIONATE Inject 200 mg into the muscle every 14 (fourteen) days.   valsartan 80 MG tablet Commonly known as: DIOVAN Take 80 mg by mouth daily.        Disposition and follow-up:   Edward [name] was discharged from [facility] in [place] condition.  At  the hospital follow up visit please address:  1.  Symptomatic anemia likely secondary to GI bleed.  EGD showing chronic gastritis with no signs of bleeding.  Colonoscopy showing two 3-mm polyps in transverse colon s/p cold snare removal and widespread diverticulosis and colon.  Hemoglobin stable at discharge.  Can restart anticoagulation today per GI.  2. CAD s/p CABG (2013), LHC with PCI (01/2021). Atrial fibrillation. Per patient, on plavix and eliquis for anticoagulation.  Please closely monitor patient's bleeding risk and consider discontinuation of Plavix as soon as medically indicated.  3.  CKD stage IIIb.  Baseline  creatinine 2.34 about 2 years ago.  Since admission, creatinine between 2.6-2.8, possible new baseline.  Unable to see [facility] records for more recent baseline.  4.  Chronic diastolic heart failure.  Discharging with home Lasix.  Please assess for lower extremity edema and adjust Lasix dosing accordingly.  2.  Labs / imaging needed at time of follow-up: CBC, BMP  3.  Pending labs/ test needing follow-up: f/u surgical pathology  Follow-up Appointments: -[facility] PCP, cardiology, gastroenterology (patient to schedule)  Hospital Course by problem list: 1.  Symptomatic anemia likely secondary to GI bleed.  Patient presented with subacute progressive weakness, dyspnea with exertion, fatigue and was found to have hemoglobin of 6.8 at his outpatient cardiologist's office.  On admission, hemoglobin 6.7.  He has a known history of anemia secondary to erosive gastritis and history of colon polyps and small adenoma.  Patient is a high bleeding risk because he is on Eliquis and Plavix given extensive cardiac history, including atrial fibrillation, and recent LHC with PCI/stent placement.  GI was consulted.  EGD showed chronic gastritis with no signs of bleeding.  Colonoscopy performed the following day showed two 3-mm polyps in transverse colon s/p cold snare removal and widespread diverticulosis and colon.  Hemoglobin slightly down trended overnight, likely due to polyp removal and having been on 2 blood thinners.  However, afternoon CBC today showed that hemoglobin is stable.  Discussed with GI, patient is medically stable for discharge and can restart home anticoagulation today.  He will need outpatient follow-up with [facility] primary care PCP and gastroenterology.  2. CAD s/p CABG (2013), LHC with PCI (01/2021). Atrial fibrillation.  Patient with history of recent left heart cath with PCI and stenting in January 2023.  He was on triple therapy with Eliquis, Plavix, aspirin for 3 weeks and  finished his course of aspirin last week.  On admission, he did report taking his Eliquis and Plavix that day.  Patient has noted that he has had several bleeding events since starting triple therapy.  Please closely monitor patient's bleeding risk and consider risks and benefits of having Plavix and Eliquis in this elderly gentleman and tried to discontinue Plavix as soon as medically indicated.  He will need outpatient follow-up with [facility] cardiology.  3.  CKD stage IIIb.  Baseline creatinine 2.34 about 2 years ago.  Since admission, creatinine between 2.6-2.8, possible new baseline.  Unable to see [facility] records for more recent baseline.  4.  Chronic diastolic heart failure.  Received IV Lasix 40 mg daily x2 days while here.  He is close to being euvolemic.  Discharging with home Lasix.  Please assess for lower extremity edema and adjust Lasix dosing accordingly.  Pertinent Labs, Studies, and Procedures:  CBC Latest Ref Rng & Units 03/19/2021 03/19/2021 03/18/2021  WBC 4.0 - 10.5 K/uL 11.7(H) 12.2(H) 15.2(H)  Hemoglobin 13.0 - 17.0 g/dL 8.3(L)  8.5(L) 9.6(L)  Hematocrit 39.0 - 52.0 % 28.6(L) 28.9(L) 33.1(L)  Platelets 150 - 400 K/uL 313 339 373   CMP Latest Ref Rng & Units 03/19/2021 03/18/2021 03/17/2021  Glucose 70 - 99 mg/dL 156(H) 127(H) 112(H)  BUN 8 - 23 mg/dL 37(H) 46(H) 50(H)  Creatinine 0.61 - 1.24 mg/dL 2.65(H) 2.62(H) 2.63(H)  Sodium 135 - 145 mmol/L 135 134(L) 131(L)  Potassium 3.5 - 5.1 mmol/L 4.0 5.1 4.0  Chloride 98 - 111 mmol/L 103 102 98  CO2 22 - 32 mmol/L 21(L) 22 24  Calcium 8.9 - 10.3 mg/dL 8.3(L) 8.7(L) 8.4(L)  Total Protein 6.5 - 8.1 g/dL - - 6.5  Total Bilirubin 0.3 - 1.2 mg/dL - - 2.3(H)  Alkaline Phos 38 - 126 U/L - - 112  AST 15 - 41 U/L - - 21  ALT 0 - 44 U/L - - 11   BNP    Component Value Date/Time   BNP 81.2 03/16/2021 1256   BNP 120.2 (H) 02/06/2016 1124    DG Chest 2 View  Result Date: 03/16/2021 CLINICAL DATA:  Shortness of breath.   Weakness.  Anemia. EXAM: CHEST - 2 VIEW COMPARISON:  Chest two views 02/12/2017 FINDINGS: Left chest wall cardiac pacer is seen with leads not significantly changed. Atrial appendage closure device is again seen. Cardiac silhouette is again mildly enlarged. Status post median sternotomy. Mild calcification is again seen within aortic arch. Small left pleural effusion new from prior. Otherwise the lungs are clear. No pneumothorax is seen. No acute skeletal abnormality. IMPRESSION: Small left pleural effusion. Unchanged mild cardiomegaly. Electronically Signed   By: Yvonne Kendall M.D.   On: 03/16/2021 13:50     Discharge Instructions: Discharge Instructions     (HEART FAILURE PATIENTS) Call MD:  Anytime you have any of the following symptoms: 1) 3 pound weight gain in 24 hours or 5 pounds in 1 week 2) shortness of breath, with or without a dry hacking cough 3) swelling in the hands, feet or stomach 4) if you have to sleep on extra pillows at night in order to breathe.   Complete by: As directed    Call MD for:  difficulty breathing, headache or visual disturbances   Complete by: As directed    Call MD for:  extreme fatigue   Complete by: As directed    Diet - low sodium heart healthy   Complete by: As directed    Discharge instructions   Complete by: As directed    Edward [name], it was a pleasure taking care of you during your stay here.  You came in because of low blood counts and received some blood transfusions here.  You underwent an endoscopy and a colonoscopy that did not show any active bleeding.  Your hemoglobin is stable now.  Please take note of the following: 1.  Please make sure to take your omeprazole twice daily as you have been.  This will help decrease your chances of a stomach bleed. 2.  Please make sure to schedule follow-up with a stomach doctor at the [facility]. 3.  Please make sure to schedule follow-up with your cardiologist at [facility]. 4.  Please make sure to  schedule follow-up with your primary care doctor at [facility]. 5.  Please talk to your primary care doctor about getting physical therapy done at [facility] to regain your strength.   Increase activity slowly   Complete by: As directed    No wound care   Complete  by: As directed        Signed: Virl Axe, MD 03/19/2021, 12:39 PM   Pager: [phone]

## 2021-03-19 NOTE — Progress Notes (Signed)
? ?Subjective:  ? ?No acute overnight events. ? ?He is doing well this morning, ready to go home.  Discussed that hemoglobin slightly down trended but will repeat this afternoon and if stable he can go later today.  He is agreeable with this.  No hematemesis, hematochezia, melena, or other signs of overt bleeding noted since yesterday. ? ? ?Objective: ? ?Vital signs in last 24 hours: ?Vitals:  ? 03/18/21 1507 03/18/21 2049 03/19/21 0516 03/19/21 0928  ?BP: 138/62 125/64 (!) 143/73 131/72  ?Pulse: 91 88 92 99  ?Resp: 12 18 19 18   ?Temp: 98 ?F (36.7 ?C) 98.1 ?F (36.7 ?C) (!) 97.4 ?F (36.3 ?C) 98.1 ?F (36.7 ?C)  ?TempSrc: Oral Oral Oral Oral  ?SpO2: 100% 99% 97% 98%  ?Weight:      ?Height:      ? ?Physical Exam ?General: Pleasant, elderly gentleman, lying in bed, NAD. ?CV: Normal rate and irregular rhythm, no murmurs rubs or gallops. ?Pulm: Normal work of breathing on room air, no respiratory distress noted. ?Abdomen: Soft, nondistended, nontender to palpation, normoactive bowel sounds. ?MSK: Trace pitting edema bilaterally with chronic venous stasis changes in LLE.  Right great toe with dry ulceration.  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 ?Skin: Bilateral hands cool to touch, right elbow dressing in place. ?Neuro: AAOx3, no sensation in LLE from about an inch below knee (chronic). ? ? ?Assessment/Plan: ? ?Principal Problem: ?  Symptomatic anemia ?Active Problems: ?  PAF (paroxysmal atrial fibrillation) ([place]) ?  Diabetes mellitus ([place]) ?  PVD (peripheral vascular disease) with claudication ([place]) ?  Chronic diastolic CHF (congestive heart failure) ([place]) ?  Dyspnea ?  Chronic kidney disease, stage 3b ([place]) ?  Benign essential hypertension ?  GI bleed ?  Gastritis and gastroduodenitis ? ?Mr. Napoli is a 81 year old male with extensive cardiac history, most recently with left heart cath and PCI completed January 2023, presenting with low hemoglobin noted at PCP, now undergoing workup for  symptomatic anemia likely 2/2 GI bleed. ?  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  Also has history of colon polyps and small adenoma.  GI following. EGD with chronic gastritis and no signs of bleed.  Fatigue vastly improved after transfusions. Colonoscopy revealed 2-25m polyps in transverse colon s/p cold snare removal and widespread diverticulosis in colon.  Slight downtrend in hemoglobin overnight, will repeat this afternoon.  Discussed with GI, if hemoglobin stable this afternoon can discharge and restart home anticoagulation today. ?-Greatly appreciate GI assistance ?-Tolerating diet ?-f/u pathology results ?-Follow-up afternoon CBC ?-[facility]?-PT recommending outpatient PT ?  ?Atrial fibrillation ?Patient taking Eliquis 5 mg twice daily.  Reports medication compliance.  ?- Holding home anticoagulation due to above, can restart at discharge per GI ? ?CAD status post CABG ?Left heart cath with PCI ?Patient initiated on ASA 81 mg and Plavix 75 mg daily x30 days after PCI in January.  Patient completed dual therapy and continued with Plavix alone. [facility]?- Holding home plavix due to above, can restart at discharge per GI ?  ?CKD stage IIIb ?Baseline creatinine 2.34 about 2 years ago. Cr since admission between 2.6-2.8, likely new baseline. ?- Avoid nephrotoxic agents ? ?CHF ?Home meds: Lasix 40 mg every morning and 20 mg q. Evening, Coreg 12.5 mg twice daily, Imdur ER 30 mg daily.  Trace pitting edema bilaterally.  We  will resume home Lasix at discharge. ?  ?Chronic venous insufficiency ?Patient with LLE discoloration and left greater than right swelling which is chronic.    Imaging from 01/2021: LE arterial duplex with patent femoral-popliteal bypass past graft; LE ABI and TBI within normal limits bilaterally; LE venous reflux study with no evidence of DVT in the LLE  nor superficial venous reflux in the left short saphenous vein.  Greater saphenous vein reversed for bypass graft ? ?T2DM ?Home meds: Lantus 48 units daily and Ozempic.  No recent A1c on file to determine level of control.  ?-A1c 4.6%; obtained post-transfusion, so result likely skewed. ?- CBG monitoring ?  ?Prior to Admission Living Arrangement: Home with wife ?Anticipated Discharge Location: Home ?Barriers to Discharge: Continued medical management dispo: Anticipated discharge in approximately 0-1 day(s).  ? ?Virl Axe, MD ?03/19/2021, 12:12 PM ?Pager: [phone] ?After 5pm on weekdays and 1pm on weekends: On Call pager [phone]  ?

## 2021-03-19 NOTE — Evaluation (Signed)
Occupational Therapy Evaluation ?Patient Details ?Name: Edward [name] ?MRN: [id number] ?DOB: 13-Aug-1940 ?Today's Date: 03/19/2021 ? ? ?History of Present Illness 81 y/o male presented to ED on 03/16/21 after being sent from [place] for low Hgb of 6.7. EGD showed gastritis. Colonscopy showed 2 polyps in transverse colon which were removed. 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 ?Clinical Impression ?  ?Pt admitted for concerns listed above. PTA pt reported that he was mainly independent with all ADL's and IADL's, however sometimes he would get his wife to assist him. At this time, pt presents near his baseline with minimal weakness, requiring supervision to min guard for safety. Anticipate pt will improve in his home environment, as he will be up and moving around more. He has no further OT needs and acute OT will sign off.   ?   ? ?Recommendations for follow up therapy are one component of a multi-disciplinary discharge planning process, led by the attending physician.  Recommendations may be updated based on patient status, additional functional criteria and insurance authorization.  ? ?Follow Up Recommendations ? No OT follow up  ?  ?Assistance Recommended at Discharge PRN  ?Patient can return home with the following A little help with bathing/dressing/bathroom ? ?  ?Functional Status Assessment ? Patient has had a recent decline in their functional status and demonstrates the ability to make significant improvements in function in a reasonable and predictable amount of time.  ?Equipment Recommendations ? None recommended by OT  ?  ?Recommendations for Other Services   ? ? ?  ?Precautions / Restrictions Precautions ?Precautions: Fall ?Restrictions ?Weight Bearing Restrictions: No  ? ?  ? ?Mobility Bed Mobility ?Overal bed mobility: Needs Assistance ?Bed Mobility: Supine to Sit, Sit to Supine ?  ?  ?Supine to  sit: Min guard ?Sit to supine: Supervision ?  ?General bed mobility comments: Heavy use of bed rail ?  ? ?Transfers ?Overall transfer level: Needs assistance ?Equipment used: Rolling walker (2 wheels) ?Transfers: Sit to/from Stand ?Sit to Stand: Supervision ?  ?  ?  ?  ?  ?General transfer comment: Sup for safety as pt reports he feels a little weaker than before admission ?  ? ?  ?Balance Overall balance assessment: Mild deficits observed, not formally tested ?  ?  ?  ?  ?  ?  ?  ?  ?  ?  ?  ?  ?  ?  ?  ?  ?  ?  ?   ? ?ADL either performed or assessed with clinical judgement  ? ?ADL Overall ADL's : At baseline ?  ?  ?  ?  ?  ?  ?  ?  ?  ?  ?  ?  ?  ?  ?  ?  ?  ?  ?  ?General ADL Comments: Pt able to complete all ADL's independently, however wife reports she can help his as he needs it as well.  ? ? ? ?Vision Baseline Vision/History: 1 Wears glasses ?Ability to See in Adequate Light: 0 Adequate ?Patient Visual Report: No change from baseline ?Vision Assessment?: No apparent visual deficits  ?   ?Perception   ?  ?Praxis   ?  ? ?Pertinent Vitals/Pain Pain Assessment ?Pain Assessment: No/denies pain  ? ? ? ?Hand Dominance Right ?  ?Extremity/Trunk Assessment Upper Extremity Assessment ?Upper Extremity Assessment: Overall WFL for  tasks assessed ?  ?Lower Extremity Assessment ?Lower Extremity Assessment: Defer to PT evaluation ?  ?Cervical / Trunk Assessment ?Cervical / Trunk Assessment: Kyphotic ?  ?Communication Communication ?Communication: No difficulties ?  ?Cognition Arousal/Alertness: Awake/alert ?Behavior During Therapy: Flat affect ?Overall Cognitive Status: Within Functional Limits for tasks assessed ?  ?  ?  ?  ?  ?  ?  ?  ?  ?  ?  ?  ?  ?  ?  ?  ?General Comments: Pt reports that he is ready to leave, irritated by hospital stay ?  ?  ?General Comments  VSS on RA, pt and wife anxious for discharge ? ?  ?Exercises   ?  ?Shoulder Instructions    ? ? ?Home Living Family/patient expects to be discharged to::  Private residence ?Living Arrangements: Spouse/significant other ?Available Help at Discharge: Family ?Type of Home: House ?Home Access: Stairs to enter ?Entrance Stairs-Number of Steps: 5 ?Entrance Stairs-Rails: Can reach both ?Home Layout: Two level;Able to live on main level with bedroom/bathroom ?  ?  ?Bathroom Shower/Tub: Walk-in shower ?  ?Bathroom Toilet: Standard ?  ?  ?Home Equipment: Rollator (4 wheels);Cane - single Barista (2 wheels) ?  ?  ?  ? ?  ?Prior Functioning/Environment Prior Level of Function : Independent/Modified Independent;History of Falls (last six months) ?  ?  ?  ?  ?  ?  ?Mobility Comments: using the rollator for mobility ?  ?  ? ?  ?  ?OT Problem List: Decreased strength;Decreased activity tolerance;Impaired balance (sitting and/or standing) ?  ?   ?OT Treatment/Interventions:    ?  ?OT Goals(Current goals can be found in the care plan section) Acute Rehab OT Goals ?Patient Stated Goal: To go home ?OT Goal Formulation: With patient ?Time For Goal Achievement: 03/19/21 ?Potential to Achieve Goals: Good  ?OT Frequency:   ?  ? ?Co-evaluation   ?  ?  ?  ?  ? ?  ?AM-PAC OT "6 Clicks" Daily Activity     ?Outcome Measure Help from another person eating meals?: None ?Help from another person taking care of personal grooming?: None ?Help from another person toileting, which includes using toliet, bedpan, or urinal?: None ?Help from another person bathing (including washing, rinsing, drying)?: A Little ?Help from another person to put on and taking off regular upper body clothing?: None ?Help from another person to put on and taking off regular lower body clothing?: A Little ?6 Click Score: 22 ?  ?End of Session Equipment Utilized During Treatment: Gait belt;Rolling walker (2 wheels) ?Nurse Communication: Mobility status ? ?Activity Tolerance: Patient tolerated treatment well ?Patient left: in bed;with call bell/phone within reach ? ?OT Visit Diagnosis: Unsteadiness on feet  (R26.81);Other abnormalities of gait and mobility (R26.89);Muscle weakness (generalized) (M62.81)  ?              ?Time: 2297-9892 ?OT Time Calculation (min): 11 min ?Charges:  OT General Charges ?$OT Visit: 1 Visit ?OT Evaluation ?$OT Eval Moderate Complexity: 1 Mod ? ?Akshith Moncus H., OTR/L ?Acute Rehabilitation ? ?Marlyn Tondreau Elane Yolanda Bonine ?03/19/2021, 12:28 PM ?

## 2021-03-20 LAB — SURGICAL PATHOLOGY

## 2021-03-21 LAB — SURGICAL PATHOLOGY

## 2021-04-06 ENCOUNTER — Ambulatory Visit: Payer: Federal, State, Local not specified - PPO | Admitting: Nurse Practitioner

## 2021-04-24 ENCOUNTER — Ambulatory Visit: Payer: Federal, State, Local not specified - PPO | Admitting: Nurse Practitioner

## 2021-05-06 ENCOUNTER — Inpatient Hospital Stay (HOSPITAL_COMMUNITY)
Admission: EM | Admit: 2021-05-06 | Discharge: 2021-05-09 | DRG: 441 | Disposition: A | Discharge status: Home / Self Care | Payer: No Typology Code available for payment source | Attending: Internal Medicine | Admitting: Internal Medicine

## 2021-05-06 ENCOUNTER — Emergency Department (HOSPITAL_COMMUNITY): Payer: No Typology Code available for payment source

## 2021-05-06 ENCOUNTER — Encounter (HOSPITAL_COMMUNITY): Payer: Self-pay | Admitting: Emergency Medicine

## 2021-05-06 ENCOUNTER — Other Ambulatory Visit: Payer: Self-pay

## 2021-05-06 DIAGNOSIS — Z87891 Personal history of nicotine dependence: Secondary | ICD-10-CM

## 2021-05-06 DIAGNOSIS — E11610 Type 2 diabetes mellitus with diabetic neuropathic arthropathy: Secondary | ICD-10-CM | POA: Diagnosis present

## 2021-05-06 DIAGNOSIS — E785 Hyperlipidemia, unspecified: Secondary | ICD-10-CM | POA: Diagnosis present

## 2021-05-06 DIAGNOSIS — Z8673 Personal history of transient ischemic attack (TIA), and cerebral infarction without residual deficits: Secondary | ICD-10-CM

## 2021-05-06 DIAGNOSIS — I10 Essential (primary) hypertension: Secondary | ICD-10-CM | POA: Diagnosis present

## 2021-05-06 DIAGNOSIS — Z96652 Presence of left artificial knee joint: Secondary | ICD-10-CM | POA: Diagnosis present

## 2021-05-06 DIAGNOSIS — N184 Chronic kidney disease, stage 4 (severe): Secondary | ICD-10-CM | POA: Diagnosis present

## 2021-05-06 DIAGNOSIS — E662 Morbid (severe) obesity with alveolar hypoventilation: Secondary | ICD-10-CM | POA: Diagnosis present

## 2021-05-06 DIAGNOSIS — R601 Generalized edema: Secondary | ICD-10-CM | POA: Diagnosis not present

## 2021-05-06 DIAGNOSIS — Z8249 Family history of ischemic heart disease and other diseases of the circulatory system: Secondary | ICD-10-CM

## 2021-05-06 DIAGNOSIS — D509 Iron deficiency anemia, unspecified: Secondary | ICD-10-CM | POA: Diagnosis present

## 2021-05-06 DIAGNOSIS — I5189 Other ill-defined heart diseases: Secondary | ICD-10-CM

## 2021-05-06 DIAGNOSIS — Z79899 Other long term (current) drug therapy: Secondary | ICD-10-CM

## 2021-05-06 DIAGNOSIS — I2510 Atherosclerotic heart disease of native coronary artery without angina pectoris: Secondary | ICD-10-CM | POA: Diagnosis present

## 2021-05-06 DIAGNOSIS — I130 Hypertensive heart and chronic kidney disease with heart failure and stage 1 through stage 4 chronic kidney disease, or unspecified chronic kidney disease: Secondary | ICD-10-CM | POA: Diagnosis present

## 2021-05-06 DIAGNOSIS — K219 Gastro-esophageal reflux disease without esophagitis: Secondary | ICD-10-CM | POA: Diagnosis present

## 2021-05-06 DIAGNOSIS — N179 Acute kidney failure, unspecified: Secondary | ICD-10-CM | POA: Diagnosis present

## 2021-05-06 DIAGNOSIS — R946 Abnormal results of thyroid function studies: Secondary | ICD-10-CM | POA: Diagnosis present

## 2021-05-06 DIAGNOSIS — K7460 Unspecified cirrhosis of liver: Secondary | ICD-10-CM | POA: Diagnosis present

## 2021-05-06 DIAGNOSIS — E1140 Type 2 diabetes mellitus with diabetic neuropathy, unspecified: Secondary | ICD-10-CM | POA: Diagnosis present

## 2021-05-06 DIAGNOSIS — Z955 Presence of coronary angioplasty implant and graft: Secondary | ICD-10-CM

## 2021-05-06 DIAGNOSIS — Z951 Presence of aortocoronary bypass graft: Secondary | ICD-10-CM | POA: Diagnosis not present

## 2021-05-06 DIAGNOSIS — E1122 Type 2 diabetes mellitus with diabetic chronic kidney disease: Secondary | ICD-10-CM | POA: Diagnosis present

## 2021-05-06 DIAGNOSIS — R188 Other ascites: Secondary | ICD-10-CM | POA: Diagnosis present

## 2021-05-06 DIAGNOSIS — E119 Type 2 diabetes mellitus without complications: Secondary | ICD-10-CM

## 2021-05-06 DIAGNOSIS — E1151 Type 2 diabetes mellitus with diabetic peripheral angiopathy without gangrene: Secondary | ICD-10-CM | POA: Diagnosis present

## 2021-05-06 DIAGNOSIS — Z6831 Body mass index (BMI) 31.0-31.9, adult: Secondary | ICD-10-CM | POA: Diagnosis not present

## 2021-05-06 DIAGNOSIS — Z794 Long term (current) use of insulin: Secondary | ICD-10-CM

## 2021-05-06 DIAGNOSIS — Z83438 Family history of other disorder of lipoprotein metabolism and other lipidemia: Secondary | ICD-10-CM

## 2021-05-06 DIAGNOSIS — K7581 Nonalcoholic steatohepatitis (NASH): Principal | ICD-10-CM | POA: Diagnosis present

## 2021-05-06 DIAGNOSIS — I739 Peripheral vascular disease, unspecified: Secondary | ICD-10-CM | POA: Diagnosis present

## 2021-05-06 DIAGNOSIS — I480 Paroxysmal atrial fibrillation: Secondary | ICD-10-CM | POA: Diagnosis present

## 2021-05-06 DIAGNOSIS — Z950 Presence of cardiac pacemaker: Secondary | ICD-10-CM

## 2021-05-06 DIAGNOSIS — R932 Abnormal findings on diagnostic imaging of liver and biliary tract: Secondary | ICD-10-CM | POA: Diagnosis not present

## 2021-05-06 DIAGNOSIS — Z7902 Long term (current) use of antithrombotics/antiplatelets: Secondary | ICD-10-CM

## 2021-05-06 DIAGNOSIS — Z888 Allergy status to other drugs, medicaments and biological substances status: Secondary | ICD-10-CM

## 2021-05-06 DIAGNOSIS — I5033 Acute on chronic diastolic (congestive) heart failure: Secondary | ICD-10-CM | POA: Diagnosis present

## 2021-05-06 DIAGNOSIS — Z7901 Long term (current) use of anticoagulants: Secondary | ICD-10-CM

## 2021-05-06 LAB — HEPATIC FUNCTION PANEL
ALT: 11 U/L (ref 0–44)
AST: 27 U/L (ref 15–41)
Albumin: 3.1 g/dL — ABNORMAL LOW (ref 3.5–5.0)
Alkaline Phosphatase: 106 U/L (ref 38–126)
Bilirubin, Direct: 0.5 mg/dL — ABNORMAL HIGH (ref 0.0–0.2)
Indirect Bilirubin: 1.2 mg/dL — ABNORMAL HIGH (ref 0.3–0.9)
Total Bilirubin: 1.7 mg/dL — ABNORMAL HIGH (ref 0.3–1.2)
Total Protein: 7 g/dL (ref 6.5–8.1)

## 2021-05-06 LAB — CBC
HCT: 32.6 % — ABNORMAL LOW (ref 39.0–52.0)
Hemoglobin: 8.9 g/dL — ABNORMAL LOW (ref 13.0–17.0)
MCH: 22.7 pg — ABNORMAL LOW (ref 26.0–34.0)
MCHC: 27.3 g/dL — ABNORMAL LOW (ref 30.0–36.0)
MCV: 83.2 fL (ref 80.0–100.0)
Platelets: 190 10*3/uL (ref 150–400)
RBC: 3.92 MIL/uL — ABNORMAL LOW (ref 4.22–5.81)
RDW: 23.7 % — ABNORMAL HIGH (ref 11.5–15.5)
WBC: 7.3 10*3/uL (ref 4.0–10.5)
nRBC: 0.3 % — ABNORMAL HIGH (ref 0.0–0.2)

## 2021-05-06 LAB — BASIC METABOLIC PANEL
Anion gap: 8 (ref 5–15)
BUN: 44 mg/dL — ABNORMAL HIGH (ref 8–23)
CO2: 24 mmol/L (ref 22–32)
Calcium: 8.4 mg/dL — ABNORMAL LOW (ref 8.9–10.3)
Chloride: 104 mmol/L (ref 98–111)
Creatinine, Ser: 3.32 mg/dL — ABNORMAL HIGH (ref 0.61–1.24)
GFR, Estimated: 18 mL/min — ABNORMAL LOW (ref >60)
Glucose, Bld: 82 mg/dL (ref 70–99)
Potassium: 4.2 mmol/L (ref 3.5–5.1)
Sodium: 136 mmol/L (ref 135–145)

## 2021-05-06 LAB — GLUCOSE, CAPILLARY
Glucose-Capillary: 89 mg/dL (ref 70–99)
Glucose-Capillary: 122 mg/dL — ABNORMAL HIGH (ref 70–99)

## 2021-05-06 LAB — TROPONIN I (HIGH SENSITIVITY)
Troponin I (High Sensitivity): 16 ng/L (ref <18)
Troponin I (High Sensitivity): 15 ng/L (ref <18)

## 2021-05-06 LAB — BRAIN NATRIURETIC PEPTIDE: B Natriuretic Peptide: 166.8 pg/mL — ABNORMAL HIGH (ref 0.0–100.0)

## 2021-05-06 LAB — PROTIME-INR
INR: 1.8 — ABNORMAL HIGH (ref 0.8–1.2)
Prothrombin Time: 20.6 seconds — ABNORMAL HIGH (ref 11.4–15.2)

## 2021-05-06 LAB — TSH: TSH: 12.814 u[IU]/mL — ABNORMAL HIGH (ref 0.350–4.500)

## 2021-05-06 LAB — T4, FREE: Free T4: 0.67 ng/dL (ref 0.61–1.12)

## 2021-05-06 MED ORDER — INSULIN ASPART 100 UNIT/ML IJ SOLN
0.0000 [IU] | Freq: Three times a day (TID) | INTRAMUSCULAR | Status: DC
  Administered 2021-05-08: 3 [IU] via SUBCUTANEOUS
  Administered 2021-05-09: 2 [IU] via SUBCUTANEOUS

## 2021-05-06 MED ORDER — LORATADINE 10 MG PO TABS
10.0000 mg | ORAL_TABLET | Freq: Every day | ORAL | Status: DC
  Administered 2021-05-07 – 2021-05-09 (×3): 10 mg via ORAL
  Filled 2021-05-06 (×3): qty 1

## 2021-05-06 MED ORDER — BISACODYL 5 MG PO TBEC: 5.0000 mg | DELAYED_RELEASE_TABLET | Freq: Every day | ORAL | Status: DC | PRN

## 2021-05-06 MED ORDER — ACETAMINOPHEN 650 MG RE SUPP: 650.0000 mg | Freq: Four times a day (QID) | RECTAL | Status: DC | PRN

## 2021-05-06 MED ORDER — DOCUSATE SODIUM 100 MG PO CAPS
100.0000 mg | ORAL_CAPSULE | Freq: Two times a day (BID) | ORAL | Status: DC
  Administered 2021-05-06: 100 mg via ORAL
  Filled 2021-05-06 (×5): qty 1

## 2021-05-06 MED ORDER — ACETAMINOPHEN 325 MG PO TABS: 650.0000 mg | ORAL_TABLET | Freq: Four times a day (QID) | ORAL | Status: DC | PRN

## 2021-05-06 MED ORDER — ROSUVASTATIN CALCIUM 20 MG PO TABS
20.0000 mg | ORAL_TABLET | Freq: Every day | ORAL | Status: DC
  Administered 2021-05-08 – 2021-05-09 (×2): 20 mg via ORAL
  Filled 2021-05-06 (×3): qty 1

## 2021-05-06 MED ORDER — CARVEDILOL 3.125 MG PO TABS
6.2500 mg | ORAL_TABLET | Freq: Two times a day (BID) | ORAL | Status: DC
Start: 2021-05-06 — End: 2021-05-09
  Administered 2021-05-06 – 2021-05-09 (×6): 6.25 mg via ORAL
  Filled 2021-05-06 (×6): qty 2

## 2021-05-06 MED ORDER — PANTOPRAZOLE SODIUM 40 MG PO TBEC
40.0000 mg | DELAYED_RELEASE_TABLET | Freq: Every day | ORAL | Status: DC
Start: 2021-05-07 — End: 2021-05-09
  Administered 2021-05-07 – 2021-05-09 (×3): 40 mg via ORAL
  Filled 2021-05-06 (×3): qty 1

## 2021-05-06 MED ORDER — HYDRALAZINE HCL 20 MG/ML IJ SOLN
5.0000 mg | INTRAMUSCULAR | Status: DC | PRN
Start: 2021-05-06 — End: 2021-05-09

## 2021-05-06 MED ORDER — ALBUTEROL SULFATE (2.5 MG/3ML) 0.083% IN NEBU: 2.5000 mg | INHALATION_SOLUTION | Freq: Four times a day (QID) | RESPIRATORY_TRACT | Status: DC | PRN

## 2021-05-06 MED ORDER — ALBUTEROL SULFATE HFA 108 (90 BASE) MCG/ACT IN AERS: 2.0000 | INHALATION_SPRAY | Freq: Four times a day (QID) | RESPIRATORY_TRACT | Status: DC | PRN

## 2021-05-06 MED ORDER — POLYETHYLENE GLYCOL 3350 17 G PO PACK: 17.0000 g | PACK | Freq: Every day | ORAL | Status: DC | PRN

## 2021-05-06 MED ORDER — FUROSEMIDE 10 MG/ML IJ SOLN
20.0000 mg | Freq: Once | INTRAMUSCULAR | Status: AC
  Administered 2021-05-06: 20 mg via INTRAVENOUS
  Filled 2021-05-06: qty 2

## 2021-05-06 MED ORDER — FUROSEMIDE 10 MG/ML IJ SOLN
20.0000 mg | Freq: Four times a day (QID) | INTRAMUSCULAR | Status: DC
  Administered 2021-05-06 – 2021-05-07 (×3): 20 mg via INTRAVENOUS
  Filled 2021-05-06 (×4): qty 2

## 2021-05-06 MED ORDER — ALBUMIN HUMAN 5 % IV SOLN
12.5000 g | Freq: Once | INTRAVENOUS | Status: AC
  Administered 2021-05-06: 12.5 g via INTRAVENOUS
  Filled 2021-05-06: qty 250

## 2021-05-06 MED ORDER — SPIRONOLACTONE 12.5 MG HALF TABLET
12.5000 mg | ORAL_TABLET | Freq: Every day | ORAL | Status: DC
  Administered 2021-05-08 – 2021-05-09 (×2): 12.5 mg via ORAL
  Filled 2021-05-06 (×3): qty 1

## 2021-05-06 MED ORDER — SODIUM CHLORIDE 0.9% FLUSH
3.0000 mL | Freq: Two times a day (BID) | INTRAVENOUS | Status: DC
  Administered 2021-05-06 – 2021-05-09 (×5): 3 mL via INTRAVENOUS

## 2021-05-06 MED ORDER — ALLOPURINOL 100 MG PO TABS
100.0000 mg | ORAL_TABLET | Freq: Every day | ORAL | Status: DC
  Administered 2021-05-07 – 2021-05-09 (×3): 100 mg via ORAL
  Filled 2021-05-06 (×3): qty 1

## 2021-05-06 MED ORDER — ONDANSETRON HCL 4 MG/2ML IJ SOLN: 4.0000 mg | Freq: Four times a day (QID) | INTRAMUSCULAR | Status: DC | PRN

## 2021-05-06 MED ORDER — INSULIN GLARGINE-YFGN 100 UNIT/ML ~~LOC~~ SOLN
20.0000 [IU] | Freq: Every day | SUBCUTANEOUS | Status: DC
Start: 2021-05-07 — End: 2021-05-09
  Administered 2021-05-08 – 2021-05-09 (×2): 20 [IU] via SUBCUTANEOUS
  Filled 2021-05-06 (×3): qty 0.2

## 2021-05-06 MED ORDER — ONDANSETRON HCL 4 MG PO TABS
4.0000 mg | ORAL_TABLET | Freq: Four times a day (QID) | ORAL | Status: DC | PRN
Start: 2021-05-06 — End: 2021-05-09

## 2021-05-06 MED ORDER — GABAPENTIN 100 MG PO CAPS
100.0000 mg | ORAL_CAPSULE | Freq: Two times a day (BID) | ORAL | Status: DC
  Administered 2021-05-06 – 2021-05-09 (×6): 100 mg via ORAL
  Filled 2021-05-06 (×6): qty 1

## 2021-05-06 MED ORDER — OXYCODONE HCL 5 MG PO TABS: 5.0000 mg | ORAL_TABLET | ORAL | Status: DC | PRN

## 2021-05-06 NOTE — Plan of Care (Signed)
Care Plan  ?

## 2021-05-06 NOTE — H&P (Addendum)
History and Physical    Patient: Edward [name]:[id number] DOB: Jun 09, 1940 DOA: 05/06/2021 DOS: the patient was seen and examined on 05/06/2021 PCP: [facility]  Patient coming from: Home - lives with wife; Edward [name]: Wife, [phone]   Chief Complaint: Edema  HPI: Edward [name] is a 81 y.o. male with medical history significant of chronic diastolic CHF; CAD s/p CABG; DM; HLD; HTN; pacemaker placement; PVD; and afib on Eliquis presenting with edema.  He reports that it "ain't going on."  For about 2 weeks, he has had abdominal edema.  He went into the [place] yesterday, thinking he had a hernia.  He was told he had a lot of fluid and should go to the ER.  He waited until this AM to come in.  He has not had this issue before.  He always has L > R LE swelling and has a painful Charcot foot.  Now the R leg is very swollen, his scrotum looked like a fully inflated balloon.  His stomach has been getting bigger.  He has gained 15-20 pounds in the last 3 weeks.  He was 220 lb 3 weeks ago.  He has been going in for weekly iron infusions at the [place].  He was previously here for GI bleeding.  No SOB.  He has chronic DOE.  No orthopnea.  No PND.    ER Course:   Edema to belly, scrotum.  CT with anasarca, new.  RUQ [place] with ascites, cirrhosis.  Hgb at baseline, creatinine mildly worse.  Needs to come in for diuresis and possible paracentesis.     Review of Systems: As mentioned in the history of present illness. All other systems reviewed and are negative. Past Medical History:  Diagnosis Date   Anemia    ?   Carotid artery occlusion    Chronic diastolic CHF (congestive heart failure) (HCC) 04/23/2012   CKD (chronic kidney disease), stage III (HCC)    Claudication (HCC)    Coronary artery disease    s/p CABG 2013   Diabetic neuropathy (HCC)    mostly feet/ legs   Diverticulosis    Dyslipidemia    Exertional shortness of breath    GERD (gastroesophageal reflux disease)    GI bleed     while on triple anticoag therapy   HTN (hypertension)    Pacemaker    PAF (paroxysmal atrial fibrillation) (HCC)    s/p MAZE at time of CABG   PVD (peripheral vascular disease) (HCC)    R CEA 2011, prior PTA, left fem-pop 2014   Stroke (HCC)    vs TIA   Tubular adenoma of colon    Type II diabetes mellitus (HCC)    Past Surgical History:  Procedure Laterality Date   ABDOMINAL AORTAGRAM N/A 01/07/2012   Procedure: ABDOMINAL Ronny Flurry;  Surgeon: Chuck Hint, MD;  Location: [facility] CATH LAB;  Service: Cardiovascular;  Laterality: N/A;   BALLOON DILATION N/A 02/17/2013   Procedure: BALLOON DILATION;  Surgeon: Beverley Fiedler, MD;  Location: [facility] ENDOSCOPY;  Service: Gastroenterology;  Laterality: N/A;   BIOPSY  03/17/2021   Procedure: BIOPSY;  Surgeon: Iva Boop, MD;  Location: [facility] ENDOSCOPY;  Service: Gastroenterology;;   CAROTID ENDARTERECTOMY Right 03/2009   CATARACT EXTRACTION Bilateral    CHOLECYSTECTOMY     CIRCUMCISION     COLONOSCOPY WITH PROPOFOL N/A 02/17/2013   Procedure: COLONOSCOPY WITH PROPOFOL;  Surgeon: Beverley Fiedler, MD;  Location: [facility] ENDOSCOPY;  Service: Gastroenterology;  Laterality: N/A;  COLONOSCOPY WITH PROPOFOL N/A 03/18/2021   Procedure: COLONOSCOPY WITH PROPOFOL;  Surgeon: Jeani Hawking, MD;  Location: [facility] ENDOSCOPY;  Service: Gastroenterology;  Laterality: N/A;   CORONARY ARTERY BYPASS GRAFT  06/14/2011   Procedure: CORONARY ARTERY BYPASS GRAFTING (CABG);  Surgeon: Alleen Borne, MD;  Location: [facility] OR;  Service: Open Heart Surgery;  Laterality: N/A;  Coronary Artery Bypass Graft on pump times six;  utilizing internal mammary artery and right greater saphenous vein harvested endoscopically.x 5 vessels   ELECTROPHYSIOLOGY STUDY N/A 11/05/2012   Procedure: ELECTROPHYSIOLOGY STUDY;  Surgeon: Duke Salvia, MD;  Location: [facility] CATH LAB;  Service: Cardiovascular;  Laterality: N/A;   ESOPHAGOGASTRODUODENOSCOPY (EGD) WITH PROPOFOL N/A 02/17/2013   Procedure:  ESOPHAGOGASTRODUODENOSCOPY (EGD) WITH PROPOFOL;  Surgeon: Beverley Fiedler, MD;  Location: [facility] ENDOSCOPY;  Service: Gastroenterology;  Laterality: N/A;   ESOPHAGOGASTRODUODENOSCOPY (EGD) WITH PROPOFOL N/A 03/17/2021   Procedure: ESOPHAGOGASTRODUODENOSCOPY (EGD) WITH PROPOFOL;  Surgeon: Iva Boop, MD;  Location: [facility] ENDOSCOPY;  Service: Gastroenterology;  Laterality: N/A;   FEMORAL-POPLITEAL BYPASS GRAFT Left 03/06/2012   Procedure: BYPASS GRAFT FEMORAL-POPLITEAL ARTERY;  Surgeon: Chuck Hint, MD;  Location: [facility] OR;  Service: Vascular;  Laterality: Left;  Left Femoral - Below Knee Popliteal Bypass Graft with Vein and Intraoperative Arteriogram.   JOINT REPLACEMENT Left    left TKA   KNEE ARTHROSCOPY Left    "I had 3" (11/05/2012)   LEAD REVISION  11/05/2012   pacemaker/notes 11/05/2012   LEAD REVISION N/A 11/05/2012   Procedure: LEAD REVISION;  Surgeon: Duke Salvia, MD;  Location: [facility] CATH LAB;  Service: Cardiovascular;  Laterality: N/A;   MAZE  06/14/2011   Procedure: MAZE;  Surgeon: Alleen Borne, MD;  Location: [facility] OR;  Service: Open Heart Surgery;  Laterality: N/A;  Ligate left atrial appendage   PERMANENT PACEMAKER INSERTION N/A 06/20/2011   Procedure: PERMANENT PACEMAKER INSERTION;  Surgeon: Duke Salvia, MD;  Location: [facility] CATH LAB;  Service: Cardiovascular;  Laterality: N/A;   POLYPECTOMY  03/18/2021   Procedure: POLYPECTOMY;  Surgeon: Jeani Hawking, MD;  Location: [facility] ENDOSCOPY;  Service: Gastroenterology;;   TONSILLECTOMY AND ADENOIDECTOMY     TOTAL KNEE ARTHROPLASTY Left    Social History:  reports that he quit smoking about 42 years ago. His smoking use included cigarettes. He has a 96.00 pack-year smoking history. He has never used smokeless tobacco. He reports current alcohol use of about 2.0 standard drinks per week. He reports that he does not use drugs.  Allergies  Allergen Reactions   Lipitor [Atorvastatin Calcium] Other (See Comments)    Weakness/ pain in legs     Family History  Problem Relation Age of Onset   Coronary artery disease Father    Hypertension Father    Heart disease Father        Heart Disease before age 19   Heart attack Mother    Coronary artery disease Mother    Deep vein thrombosis Mother    Heart disease Mother        Heart Disease before age 71   Hyperlipidemia Mother    Hypertension Mother    Hypertension Sister    Heart disease Sister        Heart Disease before age 69   Hyperlipidemia Sister    Heart disease Brother        Heart Disease before age 50   Colon cancer Brother 38   Anesthesia problems Neg Hx    Diabetes Neg Hx  Prior to Admission medications   Medication Sig Start Date End Date Taking? Authorizing Provider  acetaminophen (TYLENOL) 650 MG CR tablet Take 650 mg by mouth every 8 (eight) hours as needed for pain.    [provider]  albuterol (PROVENTIL HFA;VENTOLIN HFA) 108 (90 Base) MCG/ACT inhaler USE 2 PUFFS EVERY 4 HOURS AS NEEDED FOR WHEEZING Patient taking differently: Inhale 2 puffs into the lungs every 6 (six) hours as needed for shortness of breath. 11/11/17   Nahser, Deloris Ping, MD  allopurinol (ZYLOPRIM) 100 MG tablet Take 100 mg by mouth daily.    [provider]  apixaban (ELIQUIS) 2.5 MG TABS tablet Take 1.25 mg by mouth 2 (two) times daily.     [provider]  blood glucose meter kit and supplies Dispense based on patient and insurance preference. Use up to four times daily as directed. (FOR ICD-10 E10.9, E11.9). 02/18/17   Bing Neighbors, FNP  carvedilol (COREG) 6.25 MG tablet Take 1 tablet (6.25 mg total) by mouth 2 (two) times daily. 02/24/19   Nahser, Deloris Ping, MD  cholecalciferol (VITAMIN D) 1000 units tablet Take 1,000 Units by mouth daily.    [provider]  Cyanocobalamin 500 MCG TBDP Take 1 tablet by mouth daily.    [provider]  ferrous sulfate 325 (65 FE) MG tablet Take 325 mg by mouth daily with breakfast.    [provider]  furosemide (LASIX) 40 MG tablet TAKE 2 TABLETS BY MOUTH EVERY MORNING AND TAKE 1 TABLET IN THE AFTERNOON Patient taking differently: Take 80 mg by mouth 2 (two) times daily. 03/18/18   Nahser, Deloris Ping, MD  gabapentin (NEURONTIN) 100 MG capsule Take 100 mg by mouth 2 (two) times daily.    [provider]  insulin glargine (LANTUS) 100 UNIT/ML injection Inject 48 Units into the skin daily.    [provider]  loratadine (CLARITIN) 10 MG tablet Take 10 mg by mouth daily.    [provider]  Multiple Vitamin (MULTIVITAMIN) capsule Take 1 capsule by mouth daily.    [provider]  omeprazole (PRILOSEC) 20 MG capsule Take 20 mg by mouth 2 (two) times daily.    [provider]  Potassium 99 MG TABS Take 99 mg by mouth daily.    [provider]  rosuvastatin (CRESTOR) 20 MG tablet Take 20 mg by mouth daily.    [provider]  Semaglutide, 1 MG/DOSE, 2 MG/1.5ML SOPN Inject 1 mg into the skin every Sunday.    [provider]  sildenafil (VIAGRA) 50 MG tablet May take 1-2 tablets as needed for sexual intercourse Patient taking differently: Take 50-100 mg by mouth as needed for erectile dysfunction. May take 1-2 tablets as needed for sexual intercourse 07/25/18   Nahser, Deloris Ping, MD  spironolactone (ALDACTONE) 25 MG tablet Take 0.5 tablets (12.5 mg total) by mouth daily. 06/28/18   Strader, Lennart Pall, PA-C  testosterone cypionate (DEPOTESTOSTERONE CYPIONATE) 200 MG/ML injection Inject 200 mg into the muscle every 14 (fourteen) days.    [provider]  valsartan (DIOVAN) 80 MG tablet Take 80 mg by mouth daily.    [provider]    Physical Exam: Vitals:   05/06/21 1130 05/06/21 1200 05/06/21 1230 05/06/21 1300  BP: (!) 142/74 111/72 128/78 111/68  Pulse:  83 82 76  Resp: 14 14 13 13   Temp:      TempSrc:      SpO2:  99% 100% 99%  General:  Appears calm and comfortable and is in NAD, mildly  cantankerous Eyes:  EOMI, normal lids, iris ENT:  hard of hearing, grossly normal lips & tongue, mmm; appropriate dentition Neck:  no LAD, masses or thyromegaly Cardiovascular:  RRR, no m/r/g. 3+ LE edema.  Respiratory:   CTA bilaterally with no wheezes/rales/rhonchi.  Normal respiratory effort. Abdomen:  taut, NT, +fluid wave Skin:  L>R stasis dermatitis without apparent cellulitis Musculoskeletal:  grossly normal tone BUE/BLE, good ROM, no bony abnormality other than chronic L charcot foot GU: significant scrotal edema Lower extremity: Limited foot exam with no ulcerations.  2+ distal pulses. Psychiatric:  grossly normal mood and affect, speech fluent and appropriate, AOx3 Neurologic:  CN 2-12 grossly intact, moves all extremities in coordinated fashion   Radiological Exams on Admission: Independently reviewed - see discussion in A/P where applicable  DG Chest 2 View  Result Date: 05/06/2021 CLINICAL DATA:  81 year old male with history of increasing shortness of breath over the past several days. History of congestive heart failure. EXAM: CHEST - 2 VIEW COMPARISON:  Chest x-ray 03/16/2021. FINDINGS: Lung volumes are normal. No consolidative airspace disease. Trace bilateral pleural effusions. Diffuse peribronchial cuffing. No pneumothorax. Pulmonary vasculature does not appear overly engorged. Heart size is mildly enlarged. Upper mediastinal contours are within normal limits. Atherosclerotic calcifications in the thoracic aorta. Status post median sternotomy. Left atrial appendage ligation clip noted. Left-sided pacemaker device in place with lead tips projecting over the expected location of the right atrium and right ventricle. IMPRESSION: 1. Trace bilateral pleural effusions. 2. Diffuse peribronchial cuffing, which could suggest an acute bronchitis. 3. Mild cardiomegaly. 4. Aortic atherosclerosis. Electronically Signed   By: Trudie Reed M.D.   On: 05/06/2021 08:34   US Abdomen  Limited RUQ (LIVER/GB)  Result Date: 05/06/2021 CLINICAL DATA:  81 year old male with abdominal pain. History of cholecystectomy. EXAM: ULTRASOUND ABDOMEN LIMITED RIGHT UPPER QUADRANT COMPARISON:  12/12/2012 ultrasound FINDINGS: Gallbladder: The gallbladder is not visualized compatible with cholecystectomy. Common bile duct: Diameter: 2.3 mm. There is no evidence of intrahepatic or extrahepatic biliary dilatation. Liver: Coarse heterogeneous hepatic echotexture noted. The hepatic surface appears nodular suggestive of cirrhosis. No focal hepatic lesions. Portal vein is patent on color Doppler imaging with normal direction of blood flow towards the liver. Other: A small to moderate amount of ascites is noted within all 4 quadrants. IMPRESSION: 1. Small to moderate amount of ascites. 2. Coarse heterogeneous hepatic echotexture with apparent nodular surface suggestive of cirrhosis. No focal hepatic lesions and normal direction portal vein flow. 3. Status post cholecystectomy. 4. No biliary dilatation. Electronically Signed   By: Harmon Pier M.D.   On: 05/06/2021 13:27    EKG: Independently reviewed.  NSR with rate 85; no evidence of acute ischemia   Labs on Admission: I have personally reviewed the available labs and imaging studies at the time of the admission.  Pertinent labs:    BUN 44/Creatinine 3.32/GFR 18 Albumin 3.1 Bili 1.7 BNP 166.8 HS troponin 16, 15 WBC 7.3 Hgb 8.9 INR 1.8 TSH 12.814 Free T4 0.67   Assessment and Plan: Principal Problem:   Anasarca Active Problems:   Chronic kidney disease, stage 4 (severe) (HCC)   Cirrhosis of liver (HCC)   Diastolic dysfunction   PAF (paroxysmal atrial fibrillation) (HCC)   Diabetes mellitus (HCC)   HTN (hypertension)   Pacemaker-[employer]   PVD (peripheral vascular disease) (HCC)   Anasarca -Patient with recent hospitalization for GI bleeding -His weight is increased by 20 pounds in  3 weeks per his report (formal weight is  pending) -Will admit, as he likely needs very judicious diuresis to prevent injury of his kidneys -Will start with Lasix 20 mg IV q6h and closely monitor strict I/O and daily weights as well as daily BMP -Source of anasarca could be any number of sources but currently appears most likely associated with cirrhosis (see below) -Will request GI consult - they will see in AM -GI has requested abdominal MRI given his new-onset cirrhosis with ascites - but his pacer is MRI-unsafe  Cirrhosis -No significant heavy ETOH history -He is overweight with long-standing DM and so fatty liver is more likely -Primary source of anasarca is abdominal, ascites -RUQ [place] with apparent cirrhosis and mild to moderate ascites -Ordered placed for US paracentesis but this will have to be performed tomorrow - labs ordered -MELD/MELD-Na score is 27, with a 90-month mortality rate of 19.6% -Child-Pugh category is B, with an 80% 1-year survival -Continue spironolactone  -With large-volume paracentesis >5L, albumin infusion of 6-8g/L removed improves survival and prevents post-paracentesis circulatory dysfunction   Stage 4 CKD -Usual GFR appears to be in the 20s -It is currently mildly worse than usual -Will need to be carefully monitored during diuresis -Renal function could be the cause of his anasarca but does not appear to be significantly worse than prior -Hold Diovan for now  Chronic diastolic CHF -Last echo may have been more recent with the [place] but these records are slow to load if they will at all -Most recent in our system was in 02/2018 with preserved EF but no diastolic evaluation -Certainly, CHF is a consideration for his ascites; however, he denies worsening DOE and does not have orthopnea, PND, or O2 requirement -Will order echo  CAD -s/p CABG (2013) and PCI (01/2021) -Continue Plavix, Crestor  Afib -Hold Eliquis for paracentesis -Has pacemaker -Rate controlled with Coreg  Elevated TSH -11.7 on  4/17 at the [place], now 12.8 -His free T4 is normal -While significantly uncontrolled hypothyroidism could potential cause edema/anasarca, in this case there are other more likely factors -Given normal free T4, this may be sick euthyroid -Recommend f/u thyroid studies in 4-6 weeks +/- endocrinology f/u  Charcot foot with PVD -Has been recommended for non-ambulatory status/strict NWB and is obtaining a wheelchair -He is at high risk for amputation -On Plavix -Continue Neurontin  HTN -Continue Coreg -Hold Diovan  DM -Recent A1c was 4.6, appears to be over-controlled -hold Lantus 48 units daily and cover with Sem-glee 20 units daily -Cover with moderate-scale SSI   Obesity -Prior BMI was 31.6, current weight requested  -Weight loss should be encouraged -Outpatient PCP/bariatric medicine f/u encouraged  -Hold Ozempic for now    Advance Care Planning:   Code Status: Full Code   Consults: GI; TOC team; nutrition  DVT Prophylaxis: SCDs  Family Communication: Wife was present throughout evaluation  Severity of Illness: The appropriate patient status for this patient is INPATIENT. Inpatient status is judged to be reasonable and necessary in order to provide the required intensity of service to ensure the patient's safety. The patient's presenting symptoms, physical exam findings, and initial radiographic and laboratory data in the context of their chronic comorbidities is felt to place them at high risk for further clinical deterioration. Furthermore, it is not anticipated that the patient will be medically stable for discharge from the hospital within 2 midnights of admission.   * I certify that at the point of admission it is my clinical judgment  that the patient will require inpatient hospital care spanning beyond 2 midnights from the point of admission due to high intensity of service, high risk for further deterioration and high frequency of surveillance  required.*  Author: Jonah Blue, MD 05/06/2021 4:33 PM  For on call review [email or web address].

## 2021-05-06 NOTE — ED Notes (Signed)
Pt is ambulatory to the bathroom with assistance. ?

## 2021-05-06 NOTE — ED Notes (Signed)
Got patient on the monitor into a gown patient is resting with family at bedside and call bell in reach got patient some warm blankets  ?

## 2021-05-06 NOTE — ED Notes (Signed)
Lab contacted to run 2nd trop from sample sent at 1207, running it now  ?

## 2021-05-06 NOTE — ED Triage Notes (Signed)
Pt has history of CHF.  Reports lower abd pressure and swelling for a few weeks.  Thought he had a hernia.  Scrotal swelling over the past few days and increased SOB.  Seen at [facility] hospital yesterday and states he was told he had fluid on his abd and no hernia. Reports bilateral foot swelling x 6 months- reports L worse than R.  Taking Lasix as prescribed. ?

## 2021-05-06 NOTE — ED Provider Notes (Signed)
?[facility] ?Provider Note ? ? ?CSN: [id number] ?Arrival date & time: 05/06/21  3419 ? ?  ? ?History ? ?Chief Complaint  ?Patient presents with  ? fluid retention  ? ? ?Edward [name] is a 81 y.o. male. ? ? Patient as above with significant medical history as below, including PAF on apixaban, CKD, HLD, DM, CAD who presents to the ED with complaint of fluid retention. ? ?Patient been compliant with his home Lasix.  Notes increased swelling to his lower extremities up to his abdomen over the past couple weeks.  Compliant with home Lasix.  He is not having any significant dyspnea and is able to sleep in his typical fashion.  Does feel as though his abdomen is more distended than normal, has some mild tightness but no significant pain.  He was seen at the [place] earlier and sent to the ER for evaluation.  He had CT at that time which was concerning for anasarca. Compliant with his lasix.  ? ? ?Past Medical History: ?No date: Anemia ?    Comment:  ? ?No date: Carotid artery occlusion ?37/90/2409: Chronic diastolic CHF (congestive heart failure) ([place]) ?No date: CKD (chronic kidney disease), stage III ([place]) ?No date: Claudication [facility]) ?No date: Coronary artery disease ?    Comment:  s/p CABG 2013 ?No date: Diabetes [facility]) ?No date: Diabetic neuropathy ([place]) ?    Comment:  mostly feet/ legs ?No date: Diverticulosis ?No date: Dyslipidemia ?No date: Exertional shortness of breath ?No date: GERD (gastroesophageal reflux disease) ?No date: GI bleed ?    Comment:  while on triple anticoag therapy ?No date: HTN (hypertension) ?No date: Hyperlipidemia ?No date: Neuromuscular disorder ([place]) ?    Comment:  neuropathy ?No date: Pacemaker ?No date: PAF (paroxysmal atrial fibrillation) ([place]) ?    Comment:  s/p MAZE at time of CABG ?No date: PVD (peripheral vascular disease) ([place]) ?    Comment:  R CEA 2011, prior PTA, left fem-pop 2014 ?No date: Stroke [facility]) ?    Comment:  vs TIA ?No date: Tubular  adenoma of colon ?No date: Type II diabetes mellitus ([place]) ? ?Past Surgical History: ?01/07/2012: ABDOMINAL AORTAGRAM; N/A ?    Comment:  Procedure: ABDOMINAL AORTAGRAM;  Surgeon: Judeth Cornfield  ?             Scot Dock, MD;  Location: [facility] CATH LAB;  Service:  ?             Cardiovascular;  Laterality: N/A; ?02/17/2013: BALLOON DILATION; N/A ?    Comment:  Procedure: BALLOON DILATION;  Surgeon: Jerene Bears, MD; ?             Location: [facility] ENDOSCOPY;  Service: Gastroenterology;   ?             Laterality: N/A; ?03/17/2021: BIOPSY ?    Comment:  Procedure: BIOPSY;  Surgeon: [name], MD;   ?             Location: [place];  Service: Gastroenterology;; ?03/2009: CAROTID ENDARTERECTOMY; Right ?No date: CATARACT EXTRACTION; Bilateral ?No date: CHOLECYSTECTOMY ?No date: CIRCUMCISION ?02/17/2013: COLONOSCOPY WITH PROPOFOL; N/A ?    Comment:  Procedure: COLONOSCOPY WITH PROPOFOL;  Surgeon: Lajuan Lines  ?             Pyrtle, MD;  Location: [facility] ENDOSCOPY;  Service:  ?             Gastroenterology;  Laterality: N/A; ?03/18/2021: COLONOSCOPY WITH PROPOFOL;  N/A ?    Comment:  Procedure: COLONOSCOPY WITH PROPOFOL;  Surgeon: Benson Norway,  ?             Saralyn Pilar, MD;  Location: [place];  Service:  ?             Gastroenterology;  Laterality: N/A; ?06/14/2011: CORONARY ARTERY BYPASS GRAFT ?    Comment:  Procedure: CORONARY ARTERY BYPASS GRAFTING (CABG);   ?             Surgeon: Gaye Pollack, MD;  Location: [place];  Service:  ?             Open Heart Surgery;  Laterality: N/A;  Coronary Artery  ?             Bypass Graft on pump times six;  utilizing internal  ?             mammary artery and right greater saphenous vein harvested ?             endoscopically.x 5 vessels ?11/05/2012: ELECTROPHYSIOLOGY STUDY; N/A ?    Comment:  Procedure: ELECTROPHYSIOLOGY STUDY;  Surgeon: Revonda Standard  ?             Caryl Comes, MD;  Location: [facility] CATH LAB;  Service:  ?             Cardiovascular;  Laterality: N/A; ?02/17/2013: ESOPHAGOGASTRODUODENOSCOPY (EGD) WITH PROPOFOL;  N/A ?    Comment:  Procedure: ESOPHAGOGASTRODUODENOSCOPY (EGD) WITH  ?             PROPOFOL;  Surgeon: Jerene Bears, MD;  Location: [facility]  ?             ENDOSCOPY;  Service: Gastroenterology;  Laterality: N/A; ?03/17/2021: ESOPHAGOGASTRODUODENOSCOPY (EGD) WITH PROPOFOL; N/A ?    Comment:  Procedure: ESOPHAGOGASTRODUODENOSCOPY (EGD) WITH  ?             PROPOFOL;  Surgeon: [name], MD;  Location: [facility]  ?             ENDOSCOPY;  Service: Gastroenterology;  Laterality: N/A; ?03/06/2012: FEMORAL-POPLITEAL BYPASS GRAFT; Left ?    Comment:  Procedure: BYPASS GRAFT FEMORAL-POPLITEAL ARTERY;   ?             Surgeon: Angelia Mould, MD;  Location: [facility];   ?             Service: Vascular;  Laterality: Left;  Left Femoral -  ?             Below Knee Popliteal Bypass Graft with Vein and  ?             Intraoperative Arteriogram. ?No date: JOINT REPLACEMENT; Left ?    Comment:  left TKA ?No date: KNEE ARTHROSCOPY; Left ?    Comment:  "I had 3" (11/05/2012) ?11/05/2012: LEAD REVISION ?    Comment:  pacemaker/notes 11/05/2012 ?11/05/2012: LEAD REVISION; N/A ?    Comment:  Procedure: LEAD REVISION;  Surgeon: Deboraha Sprang, MD;  ?             Location: [place] CATH LAB;  Service: Cardiovascular;   ?             Laterality: N/A; ?06/14/2011: MAZE ?    Comment:  Procedure: MAZE;  Surgeon: Gaye Pollack, MD;   ?             Location: [place];  Service: Open Heart Surgery;   ?  Laterality: N/A;  [place] left atrial appendage ?06/20/2011: PERMANENT PACEMAKER INSERTION; N/A ?    Comment:  Procedure: PERMANENT PACEMAKER INSERTION;  Surgeon:  ?             Deboraha Sprang, MD;  Location: [facility] CATH LAB;  Service:  ?             Cardiovascular;  Laterality: N/A; ?03/18/2021: POLYPECTOMY ?    Comment:  Procedure: POLYPECTOMY;  Surgeon: Carol Ada, MD;   ?             Location: [place];  Service: Gastroenterology;; ?No date: TONSILLECTOMY AND ADENOIDECTOMY ?No date: TOTAL KNEE ARTHROPLASTY; Left  ? ? ?The history is provided by the  patient and the spouse. No language interpreter was used.  ? ?  ? ?Home Medications ?Prior to Admission medications   ?Medication Sig Start Date End Date Taking? Authorizing Provider  ?acetaminophen (TYLENOL) 650 MG CR tablet Take 650 mg by mouth every 8 (eight) hours as needed for pain.    [provider]  ?albuterol (PROVENTIL HFA;VENTOLIN HFA) 108 (90 Base) MCG/ACT inhaler USE 2 PUFFS EVERY 4 HOURS AS NEEDED FOR WHEEZING ?Patient taking differently: Inhale 2 puffs into the lungs every 6 (six) hours as needed for shortness of breath. 11/11/17   Nahser, Wonda Cheng, MD  ?allopurinol (ZYLOPRIM) 100 MG tablet Take 100 mg by mouth daily.    [provider]  ?apixaban (ELIQUIS) 2.5 MG TABS tablet Take 1.25 mg by mouth 2 (two) times daily.     [provider]  ?blood glucose meter kit and supplies Dispense based on patient and insurance preference. Use up to four times daily as directed. (FOR ICD-10 E10.9, E11.9). 02/18/17   Scot Jun, FNP  ?carvedilol (COREG) 6.25 MG tablet Take 1 tablet (6.25 mg total) by mouth 2 (two) times daily. 02/24/19   Nahser, Wonda Cheng, MD  ?cholecalciferol (VITAMIN D) 1000 units tablet Take 1,000 Units by mouth daily.    [provider]  ?Cyanocobalamin 500 MCG TBDP Take 1 tablet by mouth daily.    [provider]  ?ferrous sulfate 325 (65 FE) MG tablet Take 325 mg by mouth daily with breakfast.    [provider]  ?furosemide (LASIX) 40 MG tablet TAKE 2 TABLETS BY MOUTH EVERY MORNING AND TAKE 1 TABLET IN THE AFTERNOON ?Patient taking differently: Take 80 mg by mouth 2 (two) times daily. 03/18/18   Nahser, Wonda Cheng, MD  ?gabapentin (NEURONTIN) 100 MG capsule Take 100 mg by mouth 2 (two) times daily.    [provider]  ?insulin glargine (LANTUS) 100 UNIT/ML injection Inject 48 Units into the skin daily.    [provider]  ?loratadine (CLARITIN) 10 MG tablet Take 10 mg by mouth daily.    [provider]  ?Multiple  Vitamin (MULTIVITAMIN) capsule Take 1 capsule by mouth daily.    [provider]  ?omeprazole (PRILOSEC) 20 MG capsule Take 20 mg by mouth 2 (two) times daily.    [provider]  ?Pot

## 2021-05-07 ENCOUNTER — Inpatient Hospital Stay (HOSPITAL_COMMUNITY): Payer: No Typology Code available for payment source

## 2021-05-07 DIAGNOSIS — R601 Generalized edema: Secondary | ICD-10-CM | POA: Diagnosis not present

## 2021-05-07 LAB — GLUCOSE, PLEURAL OR PERITONEAL FLUID: Glucose, Fluid: 74 mg/dL

## 2021-05-07 LAB — CBC WITH DIFFERENTIAL/PLATELET
Abs Immature Granulocytes: 0.05 10*3/uL (ref 0.00–0.07)
Basophils Absolute: 0.1 10*3/uL (ref 0.0–0.1)
Basophils Relative: 1 %
Eosinophils Absolute: 0.3 10*3/uL (ref 0.0–0.5)
Eosinophils Relative: 4 %
HCT: 33.1 % — ABNORMAL LOW (ref 39.0–52.0)
Hemoglobin: 8.9 g/dL — ABNORMAL LOW (ref 13.0–17.0)
Immature Granulocytes: 1 %
Lymphocytes Relative: 10 %
Lymphs Abs: 0.6 10*3/uL — ABNORMAL LOW (ref 0.7–4.0)
MCH: 23.1 pg — ABNORMAL LOW (ref 26.0–34.0)
MCHC: 26.9 g/dL — ABNORMAL LOW (ref 30.0–36.0)
MCV: 85.8 fL (ref 80.0–100.0)
Monocytes Absolute: 0.9 10*3/uL (ref 0.1–1.0)
Monocytes Relative: 15 %
Neutro Abs: 4.2 10*3/uL (ref 1.7–7.7)
Neutrophils Relative %: 69 %
Platelets: 331 10*3/uL (ref 150–400)
RBC: 3.86 MIL/uL — ABNORMAL LOW (ref 4.22–5.81)
RDW: 24.3 % — ABNORMAL HIGH (ref 11.5–15.5)
WBC: 6.1 10*3/uL (ref 4.0–10.5)
nRBC: 0.3 % — ABNORMAL HIGH (ref 0.0–0.2)

## 2021-05-07 LAB — URINALYSIS, ROUTINE W REFLEX MICROSCOPIC
Bilirubin Urine: NEGATIVE
Glucose, UA: NEGATIVE mg/dL
Ketones, ur: NEGATIVE mg/dL
Leukocytes,Ua: NEGATIVE
Nitrite: NEGATIVE
Protein, ur: NEGATIVE mg/dL
RBC / HPF: >50 RBC/hpf — ABNORMAL HIGH (ref 0–5)
Specific Gravity, Urine: 1.009 (ref 1.005–1.030)
pH: 5 (ref 5.0–8.0)

## 2021-05-07 LAB — GRAM STAIN

## 2021-05-07 LAB — ECHOCARDIOGRAM COMPLETE
Area-P 1/2: 3.99 cm2
Height: 70 in
S' Lateral: 3 cm
Weight: 3892.8 oz

## 2021-05-07 LAB — COMPREHENSIVE METABOLIC PANEL
ALT: 10 U/L (ref 0–44)
AST: 26 U/L (ref 15–41)
Albumin: 3.1 g/dL — ABNORMAL LOW (ref 3.5–5.0)
Alkaline Phosphatase: 96 U/L (ref 38–126)
Anion gap: 9 (ref 5–15)
BUN: 41 mg/dL — ABNORMAL HIGH (ref 8–23)
CO2: 22 mmol/L (ref 22–32)
Calcium: 8.4 mg/dL — ABNORMAL LOW (ref 8.9–10.3)
Chloride: 104 mmol/L (ref 98–111)
Creatinine, Ser: 3.21 mg/dL — ABNORMAL HIGH (ref 0.61–1.24)
GFR, Estimated: 19 mL/min — ABNORMAL LOW (ref >60)
Glucose, Bld: 128 mg/dL — ABNORMAL HIGH (ref 70–99)
Potassium: 4 mmol/L (ref 3.5–5.1)
Sodium: 135 mmol/L (ref 135–145)
Total Bilirubin: 1.4 mg/dL — ABNORMAL HIGH (ref 0.3–1.2)
Total Protein: 6.7 g/dL (ref 6.5–8.1)

## 2021-05-07 LAB — GLUCOSE, CAPILLARY
Glucose-Capillary: 83 mg/dL (ref 70–99)
Glucose-Capillary: 104 mg/dL — ABNORMAL HIGH (ref 70–99)
Glucose-Capillary: 164 mg/dL — ABNORMAL HIGH (ref 70–99)

## 2021-05-07 LAB — ALBUMIN, PLEURAL OR PERITONEAL FLUID: Albumin, Fluid: 1.9 g/dL

## 2021-05-07 LAB — PROTIME-INR
INR: 1.8 — ABNORMAL HIGH (ref 0.8–1.2)
Prothrombin Time: 21.1 seconds — ABNORMAL HIGH (ref 11.4–15.2)

## 2021-05-07 LAB — LACTATE DEHYDROGENASE, PLEURAL OR PERITONEAL FLUID: LD, Fluid: 70 U/L — ABNORMAL HIGH (ref 3–23)

## 2021-05-07 LAB — PROTEIN, PLEURAL OR PERITONEAL FLUID: Total protein, fluid: 3.8 g/dL

## 2021-05-07 LAB — T3, FREE: T3, Free: 2.3 pg/mL (ref 2.0–4.4)

## 2021-05-07 MED ORDER — FUROSEMIDE 10 MG/ML IJ SOLN
80.0000 mg | Freq: Two times a day (BID) | INTRAMUSCULAR | Status: DC
  Administered 2021-05-07 – 2021-05-09 (×4): 80 mg via INTRAVENOUS
  Filled 2021-05-07 (×4): qty 8

## 2021-05-07 MED ORDER — LIDOCAINE HCL (PF) 1 % IJ SOLN
INTRAMUSCULAR | Status: AC
  Filled 2021-05-07: qty 30

## 2021-05-07 NOTE — Plan of Care (Signed)
  Problem: Clinical Measurements: Goal: Ability to maintain clinical measurements within normal limits will improve Outcome: Progressing   

## 2021-05-07 NOTE — Progress Notes (Signed)
?PROGRESS NOTE ? ? ? ?Edward [name]  OMV:[id number] DOB: 10-31-1940 DOA: 05/06/2021 ?PCP: [facility]  ?Outpatient Specialists:  ? ? ? ?Brief Narrative:  ?As per H&P:" Edward [name] is a 81 y.o. male with medical history significant of chronic diastolic CHF; CAD s/p CABG; DM; HLD; HTN; pacemaker placement; PVD; and afib on Eliquis presenting with edema.  He reports that it "ain't going on."  For about 2 weeks, he has had abdominal edema.  He went into the [place] yesterday, thinking he had a hernia.  He was told he had a lot of fluid and should go to the ER.  He waited until this AM to come in.  He has not had this issue before.  He always has L > R LE swelling and has a painful Charcot foot.  Now the R leg is very swollen, his scrotum looked like a fully inflated balloon.  His stomach has been getting bigger.  He has gained 15-20 pounds in the last 3 weeks.  He was 220 lb 3 weeks ago.  He has been going in for weekly iron infusions at the [place].  He was previously here for GI bleeding.  No SOB.  He has chronic DOE.  No orthopnea.  No PND. ?  ?ER Course:   Edema to belly, scrotum.  CT with anasarca, new.  RUQ [place] with ascites, cirrhosis.  Hgb at baseline, creatinine mildly worse.  Needs to come in for diuresis and possible paracentesis". ? ?05/07/2021: Patient seen alongside patient's wife.  Both are poor historians.  Suspect patient has OSA/OHS, currently undiagnosed.  Patient was sleepy and snoring from time to time.  Patient underwent paracentesis today and 1.5 L taken out.  History of liver cirrhosis, worsening and advanced renal disease and diastolic heart failure.  Patient is also obese with likely undiagnosed OSA/OHS with impaired right ventricular function.  Anasarca process. ? ?Assessment & Plan: ?  ?Principal Problem: ?  Anasarca ?Active Problems: ?  Chronic kidney disease, stage 4 (severe) (H[facility]) ?  Cirrhosis of liver ([place]) ?  Diastolic dysfunction ?  PAF (paroxysmal atrial fibrillation) ([place]) ?   Diabetes mellitus ([place]) ?  HTN (hypertension) ?  Pacemaker-[employer] ?  PVD (peripheral vascular disease) ([place]) ? ? ?Anasarca ?-Patient with recent hospitalization for GI bleeding ?-His weight is increased by 20 pounds in 3 weeks per his report (formal weight is pending) ?-Will admit, as he likely needs very judicious diuresis to prevent injury of his kidneys ?-Will start with Lasix 20 mg IV q6h and closely monitor strict I/O and daily weights as well as daily BMP ?-Source of anasarca could be any number of sources but currently appears most likely associated with cirrhosis (see below) ?-Will request GI consult - they will see in AM ?-GI has requested abdominal MRI given his new-onset cirrhosis with ascites - but his pacer is MRI-unsafe ?05/07/2021: Likely multifactorial, patient has liver cirrhosis, acute on chronic kidney disease stage IV, likely undiagnosed OSA with right ventricular function impairment and diastolic congestive heart failure with possible exacerbation.  Patient has undergone paracentesis.  We will change IV Lasix to 80 Mg twice daily.  Low threshold to increase to 3 times daily.  Weigh patient daily.  Monitor I's and O's.  Avoid nephrotoxins.  Await GI input. ?  ?Cirrhosis ?-No significant heavy ETOH history ?-He is overweight with long-standing DM and so fatty liver is more likely ?-Primary source of anasarca is abdominal, ascites ?-RUQ [place] with apparent cirrhosis and mild  to moderate ascites ?-Ordered placed for US paracentesis but this will have to be performed tomorrow - labs ordered ?-MELD/MELD-Na score is 27, with a 29-monthmortality rate of 19.6% ?-Child-Pugh category is B, with an 80% 1-year survival ?-Continue spironolactone  ?-With large-volume paracentesis >5L, albumin infusion of 6-8g/L removed improves survival and prevents post-paracentesis circulatory dysfunction ?05/07/2021: Await GI input. ?  ?Stage 4 CKD ?-Usual GFR appears to be in the 20s ?-It is currently mildly worse than  usual ?-Will need to be carefully monitored during diuresis ?-Renal function could be the cause of his anasarca but does not appear to be significantly worse than prior ?-Hold Diovan for now ?05/07/2021: Patient has acute kidney injury on chronic kidney disease stage IV versus progression of chronic kidney disease.  Worsening renal function may be secondary to liver or heart disease.  Patient also has impaired right ventricular function.   ?  ?Acute on chronic diastolic CHF ?-[facility] [place] [facility] [place] [facility] [place] [facility] [place] records are slow to load if they will at all ?-Most recent in our system was in 02/2018 with preserved EF but no diastolic evaluation ?-Certainly, CHF is a consideration for his ascites; however, he denies worsening DOE and does not have orthopnea, PND, or O2 requirement ?-Will order echo ?05/07/2021: Change diuretics (IV Lasix to 80 Mg twice daily) ?  ?CAD ?-s/p CABG (2013) and PCI (01/2021) ?-Continue Plavix, Crestor ?  ?Afib ?-Hold Eliquis for paracentesis ?-Has pacemaker ?-Rate controlled with Coreg ?  ?Elevated TSH ?-11[facility] [facility] [place] 12[facility] ?-His free T4 is normal ?-While significantly uncontrolled hypothyroidism could potential cause edema/anasarca, in this case there are other more likely factors ?-Given normal free T4, this may be sick euthyroid ?-Recommend f/u thyroid studies in 4-6 weeks +/- endocrinology f/u ?  ?Charcot foot with PVD ?-Has been recommended for non-ambulatory status/strict NWB and is obtaining a wheelchair ?-He is at high risk for amputation ?-On Plavix ?-Continue Neurontin ?  ?HTN ?-Continue Coreg ?-Hold Diovan ?05/07/2021: Will likely improve with adequate diuresis. ?  ?DM ?-Recent A1c was 4.6, appears to be over-controlled ?-hold Lantus 48 units daily and cover with Sem-glee 20 units daily ?-Cover with moderate-scale SSI  ?  ?Obesity ?-Prior BMI was 31.6, current weight requested  ?-Weight loss should be encouraged ?-Outpatient PCP/bariatric medicine f/u  encouraged  ?-Hold Ozempic for now ?  ?DVT prophylaxis: SCD ?Code Status: Full code ?Family Communication:  ?Disposition Plan:  ? ? ?Consultants:  ?GI ? ?Procedures:  ?Paracentesis ? ?Antimicrobials:  ?None ? ? ?Subjective: ?Worsening edema ?Shortness of breath ? ?Objective: ?Vitals:  ? 05/07/21 0851 05/07/21 0854 05/07/21 0857 05/07/21 0900  ?BP: (!) 151/89 136/81 (!) 143/76 136/84  ?Pulse:      ?Resp:      ?Temp:      ?TempSrc:      ?SpO2:      ?Weight:      ?Height:      ? ?No intake or output data in the 24 hours ending 05/07/21 0922 ?Filed Weights  ? 05/07/21 0340  ?Weight: 110.4 kg  ? ? ?Examination: ? ?General exam: Appears calm and comfortable.  Patient is obese.  Patient is sleepy.  Patient snores from time to time ?Respiratory system: Clear to auscultation.  ?Cardiovascular system: S1 & S2 heard.  Prominent neck veins.   ?Gastrointestinal system: Obese abdomen.  Soft and nontender.  Organs are difficult to assess.   ?Central nervous system: Intermittently sleepy.  Moves all  extremities while awake. ?Extremities: 3-3+ bilateral lower extremity edema. ? ? ?Data Reviewed: I have personally reviewed following labs and imaging studies ? ?CBC: ?Recent Labs  ?Lab 05/06/21 ?0727 05/07/21 ?0134  ?WBC 7.3 6.1  ?NEUTROABS  --  4.2  ?HGB 8.9* 8.9*  ?HCT 32.6* 33.1*  ?MCV 83.2 85.8  ?PLT 190 331  ? ?Basic Metabolic Panel: ?Recent Labs  ?Lab 05/06/21 ?0727 05/07/21 ?0134  ?NA 136 135  ?K 4.2 4.0  ?CL 104 104  ?CO2 24 22  ?GLUCOSE 82 128*  ?BUN 44* 41*  ?CREATININE 3.32* 3.21*  ?CALCIUM 8.4* 8.4*  ? ?GFR: ?Estimated Creatinine Clearance: 23.6 mL/min (A) (by C-G formula based on SCr of 3.21 mg/dL (H)). ?Liver Function Tests: ?Recent Labs  ?Lab 05/06/21 ?1015 05/07/21 ?0134  ?AST 27 26  ?ALT 11 10  ?ALKPHOS 106 96  ?BILITOT 1.7* 1.4*  ?PROT 7.0 6.7  ?ALBUMIN 3.1* 3.1*  ? ?No results for input(s): LIPASE, AMYLASE in the last 168 hours. ?No results for input(s): AMMONIA in the last 168 hours. ?Coagulation Profile: ?Recent  Labs  ?Lab 05/06/21 ?1015 05/07/21 ?0134  ?INR 1.8* 1.8*  ? ?Cardiac Enzymes: ?No results for input(s): CKTOTAL, CKMB, CKMBINDEX, TROPONINI in the last 168 hours. ?BNP (last 3 results) ?No results for input(s):

## 2021-05-07 NOTE — Progress Notes (Signed)
Pt currently has no IV access. Plan for U/S paracentesis with interventional radiology. Per rad tech, okay to send without IV. Order has been placed for IV team consult.  ?

## 2021-05-07 NOTE — Consult Note (Addendum)
? ? ?Consultation ? ?Referring Provider: TRH/Ogbata ?Primary Care Physician:  [facility] ?Primary Gastroenterologist: [facility] ? ?Reason for Consultation:  anasarca//weight gain/cirrhosis ? ?HPI: Edward [name] is a 81 y.o. male, who was admitted through the emergency room yesterday presenting with 20 to 30 pound weight gain over the past 3 weeks, with bilateral lower extremity edema, scrotal edema and increasing abdominal girth.  He had no specific complaints of shortness of breath but says at baseline he is always short of breath with exertion. ?Patient has history of coronary artery disease status post CABG, atrial fibrillation, chronic kidney disease stage IV, he is status post pacemaker, has adult onset diabetes mellitus, obesity, peripheral vascular disease, prior TIA.  He had undergone cardiac cath with PCI in January 2023, has been on Eliquis.  Also chronically on Plavix. ?He had recent admission here in March 2023 with symptomatic anemia, and had undergone EGD which showed patchy gastritis, and colonoscopy with 2 polyps removed from the transverse colon and noted diverticulosis. ? ?Patient was readmitted yesterday with the above symptoms.  He had ultrasound done that showed a nodular liver consistent with cirrhosis and a small amount of ascites.  Chest x-ray showed trace effusions mild cardiomegaly and acute bronchitis. ? ?Patient was scheduled for paracentesis which was done earlier this morning with removal of 1.5 L, cell counts are pending.  Patient says he does not feel any different after the paracentesis. ?He has been started on IV Lasix and is currently receiving 20 mg every 6 hours for diuresis. ? ?Labs on admission hemoglobin 8.9/hematocrit 32/platelets 190 ?BNP 166 ?INR 1.8, albumin 3.1 LFTs normal with exception of T. bili 1.7 ?TSH 12 troponins within normal limits ?BUN 44/creatinine 3.2 ? ?M ELD sodium 27 ? ?Plan was to get an MRI of the abdomen with attention to the liver,  however patient has pacemaker. ?He has not had any other recent abdominal imaging. ?He says that this is the first that he has ever heard that he had any liver issues. ? ?He did have 2D echo done February 2020 with EF 55 to 60%, moderately increased left ventricular wall thickness, moderate right atrial dilation ? ?Says he had a recent echo done through the [facility] in [place] ?Repeat echo ordered for today ? ?Patient has no history of EtOH, no family history of liver disease that he is aware of, no history of hepatitis. ? ? ? ?Past Medical History:  ?Diagnosis Date  ? Anemia   ? ?  ? Carotid artery occlusion   ? Chronic diastolic CHF (congestive heart failure) ([place]) 04/23/2012  ? CKD (chronic kidney disease), stage III ([place])   ? Claud[facility])   ? Coronary artery disease   ? s/p CABG 2013  ? Diabetic neuropathy ([place])   ? mostly feet/ legs  ? Diverticulosis   ? Dyslipidemia   ? Exertional shortness of breath   ? GERD (gastroesophageal reflux disease)   ? GI bleed   ? while on triple anticoag therapy  ? HTN (hypertension)   ? Pacemaker   ? PAF (paroxysmal atrial fibrillation) ([place])   ? s/p MAZE at time of CABG  ? PVD (peripheral vascular disease) ([place])   ? R CEA 2011, prior PTA, left fem-pop 2014  ? Stroke [facility])   ? vs TIA  ? Tubular adenoma of colon   ? Type II diabetes mellitus ([place])   ? ? ?Past Surgical History:  ?Procedure Laterality Date  ? ABDOMINAL AORTAGRAM N/A 01/07/2012  ? Procedure:  ABDOMINAL AORTAGRAM;  Surgeon: Angelia Mould, MD;  Location[facility] CATH LAB;  Service: Cardiovascular;  Laterality: N/A;  ? BALLOON DILATION N/A 02/17/2013  ? Procedure: BALLOON DILATION;  Surgeon: Jerene Bears, MD;  Location: [facility] ENDOSCOPY;  Service: Gastroenterology;  Laterality: N/A;  ? BIOPSY  03/17/2021  ? Procedure: BIOPSY;  Surgeon: [name], MD;  Location: [facility] ENDOSCOPY;  Service: Gastroenterology;;  ? CAROTID ENDARTERECTOMY Right 03/2009  ? CATARACT EXTRACTION Bilateral   ? CHOLECYSTECTOMY    ? CIRCUMCISION     ? COLONOSCOPY WITH PROPOFOL N/A 02/17/2013  ? Procedure: COLONOSCOPY WITH PROPOFOL;  Surgeon: Jerene Bears, MD;  Location: [facility] ENDOSCOPY;  Service: Gastroenterology;  Laterality: N/A;  ? COLONOSCOPY WITH PROPOFOL N/A 03/18/2021  ? Procedure: COLONOSCOPY WITH PROPOFOL;  Surgeon: Carol Ada, MD;  Location: [facility];  Service: Gastroenterology;  Laterality: N/A;  ? CORONARY ARTERY BYPASS GRAFT  06/14/2011  ? Procedure: CORONARY ARTERY BYPASS GRAFTING (CABG);  Surgeon: Gaye Pollack, MD;  Location: [facility];  Service: Open Heart Surgery;  Laterality: N/A;  Coronary Artery Bypass Graft on pump times six;  utilizing internal mammary artery and right greater saphenous vein harvested endoscopically.x 5 vessels  ? ELECTROPHYSIOLOGY STUDY N/A 11/05/2012  ? Procedure: ELECTROPHYSIOLOGY STUDY;  Surgeon: Deboraha Sprang, MD;  Location: [facility] CATH LAB;  Service: Cardiovascular;  Laterality: N/A;  ? ESOPHAGOGASTRODUODENOSCOPY (EGD) WITH PROPOFOL N/A 02/17/2013  ? Procedure: ESOPHAGOGASTRODUODENOSCOPY (EGD) WITH PROPOFOL;  Surgeon: Jerene Bears, MD;  Location: [facility] ENDOSCOPY;  Service: Gastroenterology;  Laterality: N/A;  ? ESOPHAGOGASTRODUODENOSCOPY (EGD) WITH PROPOFOL N/A 03/17/2021  ? Procedure: ESOPHAGOGASTRODUODENOSCOPY (EGD) WITH PROPOFOL;  Surgeon: [name], MD;  Location: [facility];  Service: Gastroenterology;  Laterality: N/A;  ? FEMORAL-POPLITEAL BYPASS GRAFT Left 03/06/2012  ? Procedure: BYPASS GRAFT FEMORAL-POPLITEAL ARTERY;  Surgeon: Angelia Mould, MD;  Location[facility] OR;  Service: Vascular;  Laterality: Left;  Left Femoral - Below Knee Popliteal Bypass Graft with Vein and Intraoperative Arteriogram.  ? JOINT REPLACEMENT Left   ? left TKA  ? KNEE ARTHROSCOPY Left   ? "I had 3" (11/05/2012)  ? LEAD REVISION  11/05/2012  ? pacemaker/notes 11/05/2012  ? LEAD REVISION N/A 11/05/2012  ? Procedure: LEAD REVISION;  Surgeon: Deboraha Sprang, MD;  Location: [facility] CATH LAB;  Service: Cardiovascular;  Laterality: N/A;  ? MAZE   06/14/2011  ? Procedure: MAZE;  Surgeon: Gaye Pollack, MD;  Location: [facility];  Service: Open Heart Surgery;  Laterality: N/A;  [place] left atrial appendage  ? PERMANENT PACEMAKER INSERTION N/A 06/20/2011  ? Procedure: PERMANENT PACEMAKER INSERTION;  Surgeon: Deboraha Sprang, MD;  Location: [facility] CATH LAB;  Service: Cardiovascular;  Laterality: N/A;  ? POLYPECTOMY  03/18/2021  ? Procedure: POLYPECTOMY;  Surgeon: Carol Ada, MD;  Location: [facility];  Service: Gastroenterology;;  ? TONSILLECTOMY AND ADENOIDECTOMY    ? TOTAL KNEE ARTHROPLASTY Left   ? ? ?Prior to Admission medications   ?Medication Sig Start Date End Date Taking? Authorizing Provider  ?acetaminophen (TYLENOL) 650 MG CR tablet Take 650 mg by mouth every 8 (eight) hours as needed for pain.    [provider]  ?albuterol (PROVENTIL HFA;VENTOLIN HFA) 108 (90 Base) MCG/ACT inhaler USE 2 PUFFS EVERY 4 HOURS AS NEEDED FOR WHEEZING ?Patient taking differently: Inhale 2 puffs into the lungs every 6 (six) hours as needed for shortness of breath. 11/11/17   Nahser, Wonda Cheng, MD  ?allopurinol (ZYLOPRIM) 100 MG tablet Take 100 mg by mouth daily.  [provider]  ?apixaban (ELIQUIS) 2.5 MG TABS tablet Take 1.25 mg by mouth 2 (two) times daily.     [provider]  ?blood glucose meter kit and supplies Dispense based on patient and insurance preference. Use up to four times daily as directed. (FOR ICD-10 E10.9, E11.9). 02/18/17   Scot Jun, FNP  ?carvedilol (COREG) 6.25 MG tablet Take 1 tablet (6.25 mg total) by mouth 2 (two) times daily. 02/24/19   Nahser, Wonda Cheng, MD  ?cholecalciferol (VITAMIN D) 1000 units tablet Take 1,000 Units by mouth daily.    [provider]  ?Cyanocobalamin 500 MCG TBDP Take 1 tablet by mouth daily.    [provider]  ?ferrous sulfate 325 (65 FE) MG tablet Take 325 mg by mouth daily with breakfast.    [provider]  ?furosemide (LASIX) 40 MG tablet TAKE 2 TABLETS BY MOUTH EVERY  MORNING AND TAKE 1 TABLET IN THE AFTERNOON ?Patient taking differently: Take 80 mg by mouth 2 (two) times daily. 03/18/18   Nahser, Wonda Cheng, MD  ?gabapentin (NEURONTIN) 100 MG capsule Take 100 mg by mouth 2

## 2021-05-07 NOTE — Procedures (Signed)
PROCEDURE SUMMARY: ? ?Successful US guided paracentesis from RLQ.  ?Yielded 1550 mL of clear yellow fluid.  ?No immediate complications.  ?Pt tolerated well.  ? ?Specimen was sent for labs. ? ?EBL < 57m ? ?KAscencion DikePA-C ?05/07/2021 ?9:16 AM ? ? ? ?

## 2021-05-07 NOTE — Plan of Care (Signed)
?  Problem: Clinical Measurements: ?Goal: Respiratory complications will improve ?Outcome: Progressing ?  ?Problem: Activity: ?Goal: Risk for activity intolerance will decrease ?Outcome: Progressing ?  ?Problem: Elimination: ?Goal: Will not experience complications related to urinary retention ?Outcome: Progressing ?  ?

## 2021-05-07 NOTE — Progress Notes (Signed)
?  Echocardiogram ?2D Echocardiogram has been performed. ? ?Edward [name] ?05/07/2021, 2:35 PM ?

## 2021-05-08 ENCOUNTER — Ambulatory Visit: Payer: Federal, State, Local not specified - PPO | Admitting: Podiatry

## 2021-05-08 DIAGNOSIS — R932 Abnormal findings on diagnostic imaging of liver and biliary tract: Secondary | ICD-10-CM

## 2021-05-08 LAB — CBC WITH DIFFERENTIAL/PLATELET
Abs Immature Granulocytes: 0.03 10*3/uL (ref 0.00–0.07)
Basophils Absolute: 0 10*3/uL (ref 0.0–0.1)
Basophils Relative: 1 %
Eosinophils Absolute: 0.2 10*3/uL (ref 0.0–0.5)
Eosinophils Relative: 3 %
HCT: 32.5 % — ABNORMAL LOW (ref 39.0–52.0)
Hemoglobin: 9.1 g/dL — ABNORMAL LOW (ref 13.0–17.0)
Immature Granulocytes: 0 %
Lymphocytes Relative: 11 %
Lymphs Abs: 0.7 10*3/uL (ref 0.7–4.0)
MCH: 22.6 pg — ABNORMAL LOW (ref 26.0–34.0)
MCHC: 28 g/dL — ABNORMAL LOW (ref 30.0–36.0)
MCV: 80.6 fL (ref 80.0–100.0)
Monocytes Absolute: 0.8 10*3/uL (ref 0.1–1.0)
Monocytes Relative: 12 %
Neutro Abs: 4.9 10*3/uL (ref 1.7–7.7)
Neutrophils Relative %: 73 %
Platelets: 177 10*3/uL (ref 150–400)
RBC: 4.03 MIL/uL — ABNORMAL LOW (ref 4.22–5.81)
RDW: 23.6 % — ABNORMAL HIGH (ref 11.5–15.5)
WBC: 6.7 10*3/uL (ref 4.0–10.5)
nRBC: 0 % (ref 0.0–0.2)

## 2021-05-08 LAB — CYTOLOGY - NON PAP

## 2021-05-08 LAB — COMPREHENSIVE METABOLIC PANEL
ALT: 11 U/L (ref 0–44)
AST: 26 U/L (ref 15–41)
Albumin: 2.9 g/dL — ABNORMAL LOW (ref 3.5–5.0)
Alkaline Phosphatase: 101 U/L (ref 38–126)
Anion gap: 7 (ref 5–15)
BUN: 40 mg/dL — ABNORMAL HIGH (ref 8–23)
CO2: 24 mmol/L (ref 22–32)
Calcium: 8.3 mg/dL — ABNORMAL LOW (ref 8.9–10.3)
Chloride: 103 mmol/L (ref 98–111)
Creatinine, Ser: 2.98 mg/dL — ABNORMAL HIGH (ref 0.61–1.24)
GFR, Estimated: 21 mL/min — ABNORMAL LOW (ref >60)
Glucose, Bld: 117 mg/dL — ABNORMAL HIGH (ref 70–99)
Potassium: 3.8 mmol/L (ref 3.5–5.1)
Sodium: 134 mmol/L — ABNORMAL LOW (ref 135–145)
Total Bilirubin: 1.4 mg/dL — ABNORMAL HIGH (ref 0.3–1.2)
Total Protein: 6.4 g/dL — ABNORMAL LOW (ref 6.5–8.1)

## 2021-05-08 LAB — GLUCOSE, CAPILLARY
Glucose-Capillary: 113 mg/dL — ABNORMAL HIGH (ref 70–99)
Glucose-Capillary: 173 mg/dL — ABNORMAL HIGH (ref 70–99)
Glucose-Capillary: 95 mg/dL (ref 70–99)

## 2021-05-08 LAB — BODY FLUID CELL COUNT WITH DIFFERENTIAL
Eos, Fluid: 0 %
Lymphs, Fluid: 24 %
Monocyte-Macrophage-Serous Fluid: 69 % (ref 50–90)
Neutrophil Count, Fluid: 6 % (ref 0–25)
Total Nucleated Cell Count, Fluid: 335 cu mm (ref 0–1000)

## 2021-05-08 LAB — ANA: Anti Nuclear Antibody (ANA): NEGATIVE

## 2021-05-08 LAB — AFP TUMOR MARKER: AFP, Serum, Tumor Marker: <1.8 ng/mL (ref 0.0–8.4)

## 2021-05-08 LAB — MAGNESIUM: Magnesium: 1.6 mg/dL — ABNORMAL LOW (ref 1.7–2.4)

## 2021-05-08 LAB — FERRITIN: Ferritin: 45 ng/mL (ref 24–336)

## 2021-05-08 MED ORDER — ADULT MULTIVITAMIN W/MINERALS CH
1.0000 | ORAL_TABLET | Freq: Every day | ORAL | Status: DC
  Administered 2021-05-08 – 2021-05-09 (×2): 1 via ORAL
  Filled 2021-05-08 (×2): qty 1

## 2021-05-08 MED ORDER — MAGNESIUM SULFATE 2 GM/50ML IV SOLN
2.0000 g | Freq: Once | INTRAVENOUS | Status: AC
  Administered 2021-05-08: 2 g via INTRAVENOUS
  Filled 2021-05-08: qty 50

## 2021-05-08 NOTE — TOC Initial Note (Signed)
Transition of Care (TOC) - Initial/Assessment Note  ? ? ?Patient Details  ?Name: Edward [name] ?MRN: [id number] ?Date of Birth: Aug 10, 1940 ? ?Transition of Care (TOC) CM/SW Contact:    ?Graves-Bigelow, Ocie Cornfield, RN ?Phone Number: ?05/08/2021, 12:12 PM ? ?Clinical Narrative:  Risk for readmission assessment completed. [place] aware of admission [facility] sent to the patient to [facility]. Patients PCP at the [facility] is Dani Gobble and CSW is Harriet Pho @  [phone] ext [phone].  Case Manager will continue to follow for additional transition of care needs as the patient progresses.          ? ? ?Expected Discharge Plan: Home/Self Care ?Barriers to Discharge: Continued Medical Work up ? ? ?Expected Discharge Plan and Services ?Expected Discharge Plan: Home/Self Care ?  ?Discharge Planning Services: CM Consult ?  ?  ?                ?  ?DME Agency: NA ?  ? Prior Living Arrangements/Services ?  ?Lives with:: Self, Spouse ?Patient language and need for interpreter reviewed:: Yes ?Do you feel safe going back to the place where you live?: Yes      ?Need for Family Participation in Patient Care: Yes (Comment) ?Care giver support system in place?: Yes (comment) ?  ?Criminal Activity/Legal Involvement Pertinent to Current Situation/Hospitalization: No - Comment as needed ? ?Activities of Daily Living ?  ?ADL Screening (condition at time of admission) ?Patient's cognitive ability adequate to safely complete daily activities?: No ?Is the patient deaf or have difficulty hearing?: No ?Does the patient have difficulty seeing, even when wearing glasses/contacts?: No ?Does the patient have difficulty concentrating, remembering, or making decisions?: No ?Patient able to express need for assistance with ADLs?: Yes ?Does the patient have difficulty dressing or bathing?: No ?Independently performs ADLs?: Yes (appropriate for developmental age) ?Does the patient have difficulty walking or climbing stairs?:  Yes ?Weakness of Legs: Both ? ?Permission Sought/Granted ?Permission sought to share information with : Family Supports, Customer service manager, Case Manager ?Permission granted to share information with : Yes, Verbal Permission Granted ?  ? ?Emotional Assessment ?Appearance:: Appears stated age ?  ?  ?  ?Alcohol / Substance Use: Not Applicable ?  ? ?Admission diagnosis:  Anasarca [R60.1] ?Cirrhosis of liver with ascites, unspecified hepatic cirrhosis type ([place]) [K74.60, R18.8] ?Patient Active Problem List  ? Diagnosis Date Noted  ? Anasarca 05/06/2021  ? Cirrhosis of liver ([place]) 05/06/2021  ? Gastritis and gastroduodenitis   ? GI bleed 03/16/2021  ? Acute nasopharyngitis 02/01/2021  ? Chronic kidney disease, stage 3b ([place]) 02/01/2021  ? Family history of colon cancer 02/01/2021  ? Gout 02/01/2021  ? Long term (current) use of anticoagulants 02/01/2021  ? Acute kidney failure, unspecified ([place]) 02/01/2021  ? Acute sinusitis 02/01/2021  ? Furuncle of buttock 02/01/2021  ? Chronic kidney disease, stage 4 (severe) ([place]) 02/01/2021  ? Contusion of left knee 02/01/2021  ? Dry eye syndrome of bilateral lacrimal glands 02/01/2021  ? Elevated white blood cell count, unspecified 02/01/2021  ? Encounter for fitting and adjustment of hearing aid 02/01/2021  ? Fracture of one rib, left side, initial encounter for closed fracture 02/01/2021  ? History of total knee arthroplasty 02/01/2021  ? Hypertensive heart and chronic kidney disease with heart failure and stage 1 through stage 4 chronic kidney disease, or unspecified chronic kidney disease ([place]) 02/01/2021  ? Hypovolemia 02/01/2021  ? Localized edema 02/01/2021  ? Pain in left wrist 02/01/2021  ?  Peripheral neuropathy 02/01/2021  ? Personal history of diseases of the skin and subcutaneous tissue 02/01/2021  ? Presbyopia 02/01/2021  ? Sensorineural hearing loss, bilateral 02/01/2021  ? Squamous cell carcinoma of scalp 02/01/2021  ? Stasis dermatitis 02/01/2021  ?  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 Tremor, unspecified 02/01/2021  ? Type 2 diabetes mellitus with hyperglycemia ([place]) 02/01/2021  ? Unilateral primary osteoarthritis, right knee 02/01/2021  ? Unspecified thoracic, thoracolumbar and lumbosacral intervertebral disc disorder 02/01/2021  ? Body mass index (BMI) 31.0-31.9, adult 10/13/2020  ? Spinal stenosis, lumbar region with neurogenic claudication 11/05/2019  ? Brain tumor ([place]) 08/13/2019  ? Pain in right knee 07/11/2018  ? Cough 02/15/2016  ? Atelectasis/ subsegmental LLL ant basal segment  03/12/2015  ? Morbid obesity ([place]) 03/12/2015  ? Upper airway cough syndrome 03/11/2015  ? PVD (peripheral vascular disease) ([place]) 08/26/2013  ? Benign neoplasm of colon 02/17/2013  ? Hx of CABG 12/05/2012  ? Dyspnea 11/21/2012  ? Pacemaker lead malfunction 09/23/2012  ? Aftercare following surgery of the circulatory system, [place] 09/10/2012  ? Diastolic dysfunction 53/00/5110  ? Chronic diastolic CHF (congestive heart failure) ([place]) 04/23/2012  ? Bilateral lower extremity edema 04/14/2012  ? PVD (peripheral vascular disease) with claudication ([place]) 03/26/2012  ? Standard chest x-ray abnormal 02/16/2012  ? Peripheral vascular disease, unspecified ([place]) 02/13/2012  ? Pacemaker-Medtronic 06/21/2011  ? Accelerated junctional rhythm 06/21/2011  ? Coronary artery disease 06/21/2011  ? S/P Maze operation for atrial fibrillation 06/21/2011  ? Sick sinus syndrome ([place]) 06/19/2011  ? History of cardiac pacemaker in situ 06/16/2011  ? HTN (hypertension) 05/31/2011  ? History of cholecystectomy 01/16/2011  ? LBBB (left bundle branch block) 11/15/2010  ? Hyperlipidemia 05/30/2010  ? Atherosclerosis of renal artery ([place]) 05/19/2010  ? PAF (paroxysmal atrial fibrillation) ([place]) 04/18/2010  ? Dyspnea on exertion 04/18/2010  ? Diabetes mellitus ([place]) 04/18/2010  ? Atherosclerosis of native artery of extremity with intermittent claudication ([place]) 04/18/2010  ? Symptomatic anemia 04/18/2010  ?  PAIN IN SOFT TISSUES OF LIMB 03/31/2010  ? CAROTID ARTERY DISEASE 03/30/2010  ? Iron deficiency anemia 01/15/2010  ? Hemorrhoids 01/15/2010  ? Peripheral arterial occlusive disease ([place]) 01/15/2010  ? Benign essential hypertension 01/15/2002  ? ?PCP:  [facility] ?Pharmacy:   ?[id number] [address] [facility] [id number]?Phone: [phone]: [phone]? ? ?Readmission Risk Interventions ? ?  05/08/2021  ? 12:11 PM  ?Readmission Risk Prevention Plan  ?Transportation Screening Complete  ?Medication Review (Press photographer Complete  ?PCP or Specialist appointment within 3-5 days of discharge Complete  ?[facility] Consult Complete  ?SW Recovery Care/Counseling Consult Complete  ?Palliative Care Screening Not Applicable  ?[place] Applicable  ? ? ? ?

## 2021-05-08 NOTE — Progress Notes (Signed)
Initial Nutrition Assessment ? ?DOCUMENTATION CODES:  ? ?Not applicable ? ?INTERVENTION:  ? ?Recommend removal of Carb Modified Diet restriction; 2g sodium restriction only at this time ?-Encouraged protein intake at each meal ? ?Pt declined Oral Nutrition Supplement (ONS) at this time ? ?Add MVI with Minerals ? ?NUTRITION DIAGNOSIS:  ? ?Inadequate oral intake related to acute illness, decreased appetite as evidenced by per patient/family report. ? ? ?GOAL:  ? ?Patient will meet greater than or equal to 90% of their needs ? ?MONITOR:  ? ?PO intake, Weight trends, Labs ? ?REASON FOR ASSESSMENT:  ? ?Consult ? (nutritional goals) ? ?ASSESSMENT:  ? ?81 yo male admitted with anasarca with weight gain with newly dx cirrhosis, acute on chronic CHF. PMH stage IV CKD, acute on chronic CHF, CAD s/p CABG, charcot foot with PVD ? ?4/23 US guided Paracentesis with 1.55 L drained ? ?Recorded po intake 100% at breakfast this AM. Pt reports appetite is fine but not eating well because the food here is "terrible". Pt reports he has not been able to eat much due to this; all of patients concerns re: food were relayed to Patients Services Team in [employer] for follow-up. Encouraged wife to bring in low sodium food options from outside that pt might enjoy. Offered pt oral nutrition supplements such as Ensure which he declined. Offered to send snacks between meals which pt also declined.  ? ?Pt with charcot foot with PVD; pt reports multiple fractures which have been evaluated by Podiatrist and it has been recommended that pt be non-ambulatory/strict NWB. Pt has mostly been laying in bed or sitting in chair. Pt does use a walker to get around some, he is currently in the process of obtaining a wheel chair.  ? ?Pt lives with his wife of 37 years; pt does the cooking and enjoys grocery shopping. Pt reports he has still be able to cook, even with his Charcot foot  ? ?At home, pt eats well, no issues with appetite. Pt denies  muscle/fat loss; +weight gain due to fluid PTA. Unsure of dry wt at this time ? ?Lab Results  ?Component Value Date  ? HGBA1C 4.6 (L) 03/16/2021  ? ? ?Labs: reviewed ?Meds: colace, lasix, ss novolog, semglee ? ?NUTRITION - FOCUSED PHYSICAL EXAM: ? ?Flowsheet Row Most Recent Value  ?Orbital Region No depletion  ?Upper Arm Region Mild depletion  ?Thoracic and Lumbar Region No depletion  ?Buccal Region No depletion  ?Temple Region No depletion  ?Clavicle Bone Region No depletion  ?Clavicle and Acromion Bone Region No depletion  ?Scapular Bone Region No depletion  ?Dorsal Hand Mild depletion  ?Patellar Region Unable to assess  ?Anterior Thigh Region --  [edema]  ?Posterior Calf Region --  [edema]  ?Edema (RD Assessment) Mild  ? ?  ? ? ?Diet Order:   ?Diet Order   ? ?       ?  Diet 2 gram sodium Room service appropriate? Yes; Fluid consistency: Thin  Diet effective now       ?  ? ?  ?  ? ?  ? ? ?EDUCATION NEEDS:  ? ?Education needs have been addressed ? ?Skin:  Skin Assessment: Reviewed RN Assessment ? ?Last BM:  4/23 ? ?Height:  ? ?Ht Readings from Last 1 Encounters:  ?05/06/21 5' 10"  (1.778 m)  ? ? ?Weight:  ? ?Wt Readings from Last 1 Encounters:  ?05/08/21 110.9 kg  ? ? ?BMI:  Body mass index is 35.08 kg/m?. ? ?Estimated Nutritional Needs:  ? ?  Kcal:  2000-2200 kcals ? ?Protein:  110-125 g ? ?Fluid:  2L ? ? ? ?Kerman Passey MS, RDN, LDN, CNSC ?Registered Dietitian III ?Clinical Nutrition ?RD Pager and On-Call Pager Number Located in [place]  ? ?

## 2021-05-08 NOTE — Progress Notes (Signed)
?PROGRESS NOTE ? ? ? ?Javarion Douty Haycraft  SJG:[id number] DOB: [birth year] DOA: 05/06/2021 ?PCP: [facility]  ?Outpatient Specialists:  ? ? ? ?Brief Narrative:  ?Patient is a 81 year old Caucasian male with past medical history significant for chronic diastolic CHF; prior GI bleed, iron deficiency CAD s/p CABG; DM; HLD; HTN; pacemaker placement; PVD; and afib on Eliquis.  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  Hep C is negative.  Patient was admitted with anasarca (greater than 30 pounds gain over 2 to 3 weeks).  Patient is aggressively being diuresed.  Patient has undergone paracentesis and 1.5 L of ascitic fluid was removed.  GI team has input is appreciated.  Edema is improving, including scrotal edema.  We will continue IV diuretics for now.   ?  ?Assessment & Plan: ?  ?Principal Problem: ?  Anasarca ?Active Problems: ?  Chronic kidney disease, stage 4 (severe) (HCC) ?  Cirrhosis of liver ([place]) ?  Diastolic dysfunction ?  PAF (paroxysmal atrial fibrillation) ([place]) ?  Diabetes mellitus ([place]) ?  HTN (hypertension) ?  Pacemaker-[employer] ?  PVD (peripheral vascular disease) ([place]) ? ? ?Anasarca ?-Patient with recent hospitalization for GI bleeding ?-His weight is increased by 20 pounds in 3 weeks per his report (formal weight is pending) ?-Will admit, as he likely needs very judicious diuresis to prevent injury of his kidneys ?-Will start with Lasix 20 mg IV q6h and closely monitor strict I/O and daily weights as well as daily BMP ?-Source of anasarca could be any number of sources but currently appears most likely associated with cirrhosis (see below) ?-Will request GI consult - they will see in AM ?-GI has requested abdominal MRI given his new-onset cirrhosis with ascites - but his pacer is MRI-unsafe ?05/07/2021: Likely multifactorial, patient has liver cirrhosis, acute on chronic kidney  disease stage IV, likely undiagnosed OSA with right ventricular function impairment and diastolic congestive heart failure with possible exacerbation.  Patient has undergone paracentesis.  We will change IV Lasix to 80 Mg twice daily.  Low threshold to increase to 3 times daily.  Weigh patient daily.  Monitor I's and O's.  Avoid nephrotoxins.  Await GI input. ?05/08/2021: Anasarca has continued to improve.  GI input is appreciated.  Follow-up ascitic fluid analysis.  Echocardiogram reveals significantly reduced right ventricular function.  Suspect patient has undiagnosed severe OSA/OHS leading to right ventricular failure and congestive hepatopathy with resultant liver cirrhosis. ?  ?Cirrhosis ?-No significant heavy ETOH history ?-He is overweight with long-standing DM and so fatty liver is more likely ?-Primary source of anasarca is abdominal, ascites ?-RUQ [place] with apparent cirrhosis and mild to moderate ascites ?-Ordered placed for US paracentesis but this will have to be performed tomorrow - labs ordered ?-MELD/MELD-Na score is 27, with a 78-monthmortality rate of 19.6% ?-Child-Pugh category is B, with an 80% 1-year survival ?-Continue spironolactone  ?-With large-volume paracentesis >5L, albumin infusion of 6-8g/L removed improves survival and prevents post-paracentesis circulatory dysfunction ?05/07/2021: GI is directing care.  See above documentation. ? ?Stage 4 CKD ?-Usual GFR appears to be in the 20s ?-It is currently mildly worse than usual ?-Will need to be carefully monitored during diuresis ?-Renal function could be the cause of his anasarca but does not appear to be significantly worse than prior ?-Hold Diovan for now ?05/07/2021: Patient has acute kidney injury on chronic kidney disease stage IV versus progression of chronic kidney disease.  Worsening  renal function is likely secondary to combined liver or heart disease.  Patient also has impaired right ventricular function.   ?  ?Acute on chronic  diastolic CHF ?-Last echo may have been more recent with the [place] but these records are slow to load if they will at all ?-Most recent in our system was in 02/2018 with preserved EF but no diastolic evaluation ?-Certainly, CHF is a consideration for his ascites; however, he denies worsening DOE and does not have orthopnea, PND, or O2 requirement ?-Will order echo ?05/07/2021: Change diuretics (IV Lasix to 80 Mg twice daily) ?05/08/2021: Continue IV Lasix 80 Mg twice daily. ?  ?CAD ?-s/p CABG (2013) and PCI (01/2021) ?-Continue Plavix, Crestor ?  ?Afib ?-Hold Eliquis for paracentesis ?-Has pacemaker ?-Rate controlled with Coreg ?  ?Elevated TSH ?-11.7 on 4/17 at the [place], now 12.8 ?-His free T4 is normal ?-While significantly uncontrolled hypothyroidism could potential cause edema/anasarca, in this case there are other more likely factors ?-Given normal free T4, this may be sick euthyroid ?-Recommend f/u thyroid studies in 4-6 weeks +/- endocrinology f/u ?  ?Charcot foot with PVD ?-Has been recommended for non-ambulatory status/strict NWB and is obtaining a wheelchair ?-He is at high risk for amputation ?-On Plavix ?-Continue Neurontin ?  ?HTN ?-Continue Coreg ?-Hold Diovan ?05/07/2021: Will likely improve with adequate diuresis. ?  ?DM ?-Recent A1c was 4.6, appears to be over-controlled ?-hold Lantus 48 units daily and cover with Sem-glee 20 units daily ?-Cover with moderate-scale SSI  ?  ?Obesity ?-Prior BMI was 31.6, current weight requested  ?-Weight loss should be encouraged ?-Outpatient PCP/bariatric medicine f/u encouraged  ?-Hold Ozempic for now ?  ?DVT prophylaxis: SCD ?Code Status: Full code ?Family Communication:  ?Disposition Plan:  ? ? ?Consultants:  ?GI ? ?Procedures:  ?Paracentesis ? ?Antimicrobials:  ?None ? ? ?Subjective: ?-Edema is improving. ?-No shortness of breath ? ?Objective: ?Vitals:  ? 05/07/21 0900 05/07/21 2030 05/08/21 0635 05/08/21 0837  ?BP: 136/84 133/71 126/76   ?Pulse:  76 82   ?Resp:  18 18    ?Temp:  (!) 97.3 ?F (36.3 ?C) 97.8 ?F (36.6 ?C) 97.6 ?F (36.4 ?C)  ?TempSrc:  Oral Oral Oral  ?SpO2:  99% 98%   ?Weight:   110.9 kg   ?Height:      ? ? ?Intake/Output Summary (Last 24 hours) at 05/08/2021 1522 ?Last data filed at 05/08/2021 0753 ?Gross per 24 hour  ?Intake 150 ml  ?Output 250 ml  ?Net -100 ml  ? ?Filed Weights  ? 05/07/21 0340 05/08/21 0635  ?Weight: 110.4 kg 110.9 kg  ? ? ?Examination: ? ?General exam: Appears calm and comfortable.  Patient is obese.  Patient is sleepy.  Patient snores from time to time ?Respiratory system: Clear to auscultation.  ?Cardiovascular system: S1 & S2 heard.  Prominent neck veins.   ?Gastrointestinal system: Obese abdomen.  Soft and nontender.  Organs are difficult to assess.   ?Central nervous system: Intermittently sleepy.  Moves all extremities while awake. ?Extremities: 2+ to 3 bilateral lower extremity edema. ? ? ?Data Reviewed: I have personally reviewed following labs and imaging studies ? ?CBC: ?Recent Labs  ?Lab 05/06/21 ?0727 05/07/21 ?0134 05/08/21 ?0426  ?WBC 7.3 6.1 6.7  ?NEUTROABS  --  4.2 4.9  ?HGB 8.9* 8.9* 9.1*  ?HCT 32.6* 33.1* 32.5*  ?MCV 83.2 85.8 80.6  ?PLT 190 331 177  ? ? ?Basic Metabolic Panel: ?Recent Labs  ?Lab 05/06/21 ?0727 05/07/21 ?0134 05/08/21 ?0426  ?NA 136 135  134*  ?K 4.2 4.0 3.8  ?CL 104 104 103  ?CO2 24 22 24   ?GLUCOSE 82 128* 117*  ?BUN 44* 41* 40*  ?CREATININE 3.32* 3.21* 2.98*  ?CALCIUM 8.4* 8.4* 8.3*  ?MG  --   --  1.6*  ? ? ?GFR: ?Estimated Creatinine Clearance: 25.5 mL/min (A) (by C-G formula based on SCr of 2.98 mg/dL (H)). ?Liver Function Tests: ?Recent Labs  ?Lab 05/06/21 ?1015 05/07/21 ?0134 05/08/21 ?0426  ?AST 27 26 26   ?ALT 11 10 11   ?ALKPHOS 106 96 101  ?BILITOT 1.7* 1.4* 1.4*  ?PROT 7.0 6.7 6.4*  ?ALBUMIN 3.1* 3.1* 2.9*  ? ? ?No results for input(s): LIPASE, AMYLASE in the last 168 hours. ?No results for input(s): AMMONIA in the last 168 hours. ?Coagulation Profile: ?Recent Labs  ?Lab 05/06/21 ?1015 05/07/21 ?0134  ?INR  1.8* 1.8*  ? ? ?Cardiac Enzymes: ?No results for input(s): CKTOTAL, CKMB, CKMBINDEX, TROPONINI in the last 168 hours. ?BNP (last 3 results) ?No results for input(s): PROBNP in the last 8760 hours. ?HbA1C: ?No resu

## 2021-05-08 NOTE — Progress Notes (Addendum)
Daily Rounding Note  05/08/2021, 10:00 AM  LOS: 2 days   SUBJECTIVE:   Chief complaint:   New diagnosis of cirrhosis of the liver with mild to moderate ascites, anasarca.  Patient feels well.  Wondering when he can leave the hospital.  Belly feels less distended.  No trouble breathing.  OBJECTIVE:         Vital signs in last 24 hours:    Temp:  [97.3 F (36.3 C)-97.8 F (36.6 C)] 97.6 F (36.4 C) (04/24 0837) Pulse Rate:  [76-82] 82 (04/24 0635) Resp:  [18] 18 (04/24 0635) BP: (126-133)/(71-76) 126/76 (04/24 0635) SpO2:  [98 %-99 %] 98 % (04/24 0635) Weight:  [110.9 kg] 110.9 kg (04/24 0635) Last BM Date : 05/07/21 Filed Weights   05/07/21 0340 05/08/21 1610  Weight: 110.4 kg 110.9 kg   General: Obese, alert, pleasant.  Does not look acutely or chronically ill. Heart: RRR. Chest: Good breath sounds with no labored breathing.  Some scattered rhonchi and mild rales in the bases. Abdomen: Obese without tenderness.  Active bowel sounds.  No masses. Extremities: Bilateral pedal/lower extremity edema. Neuro/Psych: Fully oriented.  Fluid speech.  Appropriate.  No tremors or gross deficits.  Intake/Output from previous day: 04/23 0701 - 04/24 0700 In: -  Out: 250 [Urine:250]  Intake/Output this shift: Total I/O In: 150 [P.O.:150] Out: -   Lab Results: Recent Labs    05/06/21 0727 05/07/21 0134 05/08/21 0426  WBC 7.3 6.1 6.7  HGB 8.9* 8.9* 9.1*  HCT 32.6* 33.1* 32.5*  PLT 190 331 177   BMET Recent Labs    05/06/21 0727 05/07/21 0134 05/08/21 0426  NA 136 135 134*  K 4.2 4.0 3.8  CL 104 104 103  CO2 24 22 24   GLUCOSE 82 128* 117*  BUN 44* 41* 40*  CREATININE 3.32* 3.21* 2.98*  CALCIUM 8.4* 8.4* 8.3*   LFT Recent Labs    05/06/21 1015 05/07/21 0134 05/08/21 0426  PROT 7.0 6.7 6.4*  ALBUMIN 3.1* 3.1* 2.9*  AST 27 26 26   ALT 11 10 11   ALKPHOS 106 96 101  BILITOT 1.7* 1.4* 1.4*  BILIDIR  0.5*  --   --   IBILI 1.2*  --   --    PT/INR Recent Labs    05/06/21 1015 05/07/21 0134  LABPROT 20.6* 21.1*  INR 1.8* 1.8*    Studies/Results: CT ABDOMEN PELVIS WO CONTRAST  Result Date: 05/07/2021 CLINICAL DATA:  81 year old male with cirrhosis and abdominal distension. EXAM: CT ABDOMEN AND PELVIS WITHOUT CONTRAST TECHNIQUE: Multidetector CT imaging of the abdomen and pelvis was performed following the standard protocol without IV contrast. RADIATION DOSE REDUCTION: This exam was performed according to the departmental dose-optimization program which includes automated exposure control, adjustment of the mA and/or kV according to patient size and/or use of iterative reconstruction technique. COMPARISON:  05/06/2021 ultrasound and 03/30/2009 CT. 11/23/2019 outside lumbar spine CT FINDINGS: Please note that parenchymal and vascular abnormalities may be missed as intravenous contrast was not administered. Lower chest: A small LEFT pleural effusion is noted. Hepatobiliary: Nodular hepatic contour and configuration is consistent with known cirrhosis. No focal hepatic lesions are identified on this noncontrast study. The patient is status post cholecystectomy. There is no evidence of intrahepatic or extrahepatic biliary dilatation. Pancreas: Unremarkable Spleen: UPPER limits normal spleen size noted. Adrenals/Urinary Tract: The kidneys are somewhat atrophic. There is no evidence of hydronephrosis, urinary calculi or definite renal mass. The adrenal  glands and bladder are unremarkable. Stomach/Bowel: Stomach is within normal limits. Appendix appears normal. No evidence of bowel wall thickening, distention, or inflammatory changes. Colonic diverticulosis noted without evidence of acute diverticulitis. Vascular/Lymphatic: Aortic atherosclerosis. No enlarged abdominal or pelvic lymph nodes. Reproductive: Prostate is unremarkable. Other: A small amount of ascites is noted within the abdomen and pelvis. There  is no evidence of pneumoperitoneum or definite focal collection. Mild mesenteric and subcutaneous edema also noted. Musculoskeletal: No acute or suspicious bony abnormalities are noted. Unchanged appearance of L3 with Schmorl's nodes and SUPERIOR endplate sclerosis. Moderate degenerative disc disease at L5-S1 again noted. IMPRESSION: 1. Cirrhosis with small amount of ascites, mild mesenteric and subcutaneous edema and small LEFT pleural effusion. UPPER limits normal spleen size. 2. Aortic Atherosclerosis (ICD10-I70.0). Electronically Signed   By: Harmon Pier M.D.   On: 05/07/2021 16:32   US Paracentesis  Result Date: 05/07/2021 INDICATION: History of CHF. Admitted with new findings of anasarca, cirrhosis, and ascites. Request for diagnostic and therapeutic paracentesis EXAM: ULTRASOUND GUIDED RIGHT LOWER QUADRANT PARACENTESIS MEDICATIONS: 1% plain lidocaine, 5 mL COMPLICATIONS: None immediate. PROCEDURE: Informed written consent was obtained from the patient after a discussion of the risks, benefits and alternatives to treatment. A timeout was performed prior to the initiation of the procedure. Initial ultrasound scanning demonstrates a moderate amount of ascites within the right lower abdominal quadrant. The right lower abdomen was prepped and draped in the usual sterile fashion. 1% lidocaine was used for local anesthesia. Following this, a 19 gauge, 7-cm, Yueh catheter was introduced. An ultrasound image was saved for documentation purposes. The paracentesis was performed. The catheter was removed and a dressing was applied. The patient tolerated the procedure well without immediate post procedural complication. FINDINGS: A total of approximately 1550 mL of clear yellow fluid was removed. Samples were sent to the laboratory as requested by the clinical team. IMPRESSION: Successful ultrasound-guided paracentesis yielding 1550 mL of peritoneal fluid. Read by: Brayton El PA-C Electronically Signed   By: Gilmer Mor D.O.   On: 05/07/2021 09:38   ECHOCARDIOGRAM COMPLETE  Result Date: 05/07/2021 IMPRESSIONS  1. Left ventricular ejection fraction, by estimation, is 55 to 60%. The left ventricle has normal function. The left ventricle has no regional wall motion abnormalities. There is moderate asymmetric left ventricular hypertrophy of the basal-septal segment. Left ventricular diastolic parameters are indeterminate.  2. Right ventricular systolic function is severely reduced. The right ventricular size is normal. Tricuspid regurgitation signal is inadequate for assessing PA pressure.  3. Left atrial size was mildly dilated.  4. Right atrial size was mildly dilated.  5. The pericardial effusion is posterior to the left ventricle.  6. The mitral valve is abnormal. Mild mitral valve regurgitation.  7. The aortic valve is tricuspid. Aortic valve regurgitation is not visualized. Aortic valve sclerosis/calcification is present, without any evidence of aortic stenosis.  8. The inferior vena cava is dilated in size with <50% respiratory variability, suggesting right atrial pressure of 15 mmHg. Comparison(s): Changes from prior study are noted. 02/21/2018: LVEF 55-60%, pacer wire in the RV, mild RV dysfunction, moderately diltated RA.Zoila Shutter MD Electronically signed by Zoila Shutter MD Signature Date/Time: 05/07/2021/4:19:12 PM    Final    US Abdomen Limited RUQ (LIVER/GB)  Result Date: 05/06/2021 CLINICAL DATA:  81 year old male with abdominal pain. History of cholecystectomy. EXAM: ULTRASOUND ABDOMEN LIMITED RIGHT UPPER QUADRANT COMPARISON:  12/12/2012 ultrasound FINDINGS: Gallbladder: The gallbladder is not visualized compatible with cholecystectomy. Common bile duct: Diameter: 2.3 mm. There  is no evidence of intrahepatic or extrahepatic biliary dilatation. Liver: Coarse heterogeneous hepatic echotexture noted. The hepatic surface appears nodular suggestive of cirrhosis. No focal hepatic lesions. Portal vein is patent  on color Doppler imaging with normal direction of blood flow towards the liver. Other: A small to moderate amount of ascites is noted within all 4 quadrants. IMPRESSION: 1. Small to moderate amount of ascites. 2. Coarse heterogeneous hepatic echotexture with apparent nodular surface suggestive of cirrhosis. No focal hepatic lesions and normal direction portal vein flow. 3. Status post cholecystectomy. 4. No biliary dilatation. Electronically Signed   By: Harmon Pier M.D.   On: 05/06/2021 13:27    Scheduled Meds: . allopurinol  100 mg Oral Daily  . carvedilol  6.25 mg Oral BID WC  . docusate sodium  100 mg Oral BID  . furosemide  80 mg Intravenous BID  . gabapentin  100 mg Oral BID  . insulin aspart  0-15 Units Subcutaneous TID WC  . insulin glargine-yfgn  20 Units Subcutaneous Daily  . loratadine  10 mg Oral Daily  . pantoprazole  40 mg Oral Daily  . rosuvastatin  20 mg Oral Daily  . sodium chloride flush  3 mL Intravenous Q12H  . spironolactone  12.5 mg Oral Daily   Continuous Infusions: PRN Meds:.acetaminophen **OR** acetaminophen, albuterol, bisacodyl, hydrALAZINE, ondansetron **OR** ondansetron (ZOFRAN) IV, oxyCODONE, polyethylene glycol   ASSESMENT:   Possible cirrhosis of the liver with anasarca, ascites, weight gain.   Previous cholecystectomy.  1.5 L paracentesis on 4/23, cell count pending.  SAAG 1.9 c/w portal htn but t prot 3.8 c/w cardiac cirrhosis though LVEF is just moderately really reduced at 55 to 60%.   Elevated ascitic LDH.  LFTs with mild elevation T. bili 1.4.  Low albumin at 2.9.  Normal transaminases.  Multiple tests are pending to rule out autoimmune hepatitis and viral/metabolic sources of liver disease.  Current diuretics consist of spironolactone 12.5 mg daily (same as at home), furosemide 80 mg IV bid (90 mg po daily at home).    Chronic Eliquis starting 01/2021.  Chronic Plavix since cardiac stent [facility] in 01/2021.  History PVD, TIA, A-fib, prior CABG,  pacemaker in situ.   Hx Symptomatic anemia with hospital-based work-up in March 2023 with EGD: patchy H. pylori negative gastritis, no varices or portal hypertensive gastropathy.  Colonoscopy with diverticulosis and TA polyp removed.  Current Hb relatively stable compared to levels 7 to 8 weeks ago.  Has been receiving parenteral iron and epo type infusions as outpt over several months.  Also on oral iron at home.  VCE scheduled at [facility] clinic on 5/2 (? Arranged by [facility]?). Has scheduled follow-up with GI NP Guenther at [facility] 5/3  Renal insufficiency, CKD.  Had evidence for compromise GFR dating back 10 years.  GFR in the high 20s in 2019, now in the mid 20   PLAN   Await results of fluid cell count and differential to rule out SBP as well as multiple blood tests to rule out sources for chronic liver disease.    MRI imaging of liver not pursued due to presence of cardiac pacemaker.   ? Need to up dose the aldactone?    Jennye Moccasin  05/08/2021, 10:00 AM Phone [phone]      Attending Physician Note   I have taken an interval history, reviewed the chart and examined the patient. I performed more than 50% of this encounter in conjunction with the APP. I agree  with the APP's note, impression and recommendations with my edits. My additional impressions and recommendations are as follows.   CHF, cirrhosis, anasarca, mild ascites and CKD.  Imaging studies show a cirrhotic appearing liver. Platelets are normal, SAAG=1.2, ascites TP=3.8. Ascites neutrophil count is low.  Recommend diuresis with lasix and spironolactone. 4g Na diet. Closely monitor renal function. Follow daily weights as inpatient and outpatient. Further GI, liver follow up as an outpatient. He is followed by [facility]. He has seen Dr. Rhea Belton and more recently has been referred to [facility] GI so need to determine with [facility] where GI, liver follow up should occur. We are available if needed. GI signing off.    Claudette Head, MD [facility] See [facility], [facility], for our on call provider

## 2021-05-09 DIAGNOSIS — K7460 Unspecified cirrhosis of liver: Secondary | ICD-10-CM | POA: Diagnosis not present

## 2021-05-09 DIAGNOSIS — R188 Other ascites: Secondary | ICD-10-CM

## 2021-05-09 DIAGNOSIS — R601 Generalized edema: Secondary | ICD-10-CM | POA: Diagnosis not present

## 2021-05-09 DIAGNOSIS — N184 Chronic kidney disease, stage 4 (severe): Secondary | ICD-10-CM | POA: Diagnosis not present

## 2021-05-09 LAB — GLUCOSE, CAPILLARY: Glucose-Capillary: 131 mg/dL — ABNORMAL HIGH (ref 70–99)

## 2021-05-09 LAB — PATHOLOGIST SMEAR REVIEW

## 2021-05-09 MED ORDER — POLYVINYL ALCOHOL 1.4 % OP SOLN
1.0000 [drp] | OPHTHALMIC | Status: DC | PRN
  Administered 2021-05-09: 1 [drp] via OPHTHALMIC
  Filled 2021-05-09: qty 15

## 2021-05-09 MED ORDER — SPIRONOLACTONE 25 MG PO TABS: 25.0000 mg | ORAL_TABLET | Freq: Every day | ORAL | 3 refills | Status: DC

## 2021-05-09 NOTE — Discharge Summary (Signed)
?Physician Discharge Summary ?  ?Patient: Edward [name] MRN: [id number] DOB: 03-11-1940  ?Admit date:     05/06/2021  ?Discharge date: 05/09/2021  ?Discharge Physician: Dwyane Dee  ? ?PCP: [facility]  ? ?Recommendations at discharge:  ? ? Follow up with GI ?Repeat BMP ?Repeat thyroid studies in 4 to 6 weeks ? ?Discharge Diagnoses: ?Principal Problem: ?  Anasarca ?Active Problems: ?  Chronic kidney disease, stage 4 (severe) (HCC) ?  Cirrhosis of liver ([place]) ?  Diastolic dysfunction ?  PAF (paroxysmal atrial fibrillation) ([place]) ?  Diabetes mellitus ([place]) ?  HTN (hypertension) ?  Pacemaker-Medtronic ?  PVD (peripheral vascular disease) ([place]) ? ?Resolved Problems: ?  * No resolved hospital problems. * ? ?Hospital Course: ? ?Anasarca ?-Etiology suspected due to underlying Karlene Lineman cirrhosis with some possible contribution from right heart dysfunction in setting of possible underlying OSA/OHS ?-Responded well to aggressive diuresis with Lasix ?- Continued on Lasix and spironolactone at discharge ?- Needs repeat BMP at follow-up ? ?Possible OSA/OHS ?- patient is recommended to have outpatient sleep study ?- Echo shows severely reduced right ventricular systolic function ?  ?NASH Cirrhosis with ascites ?-No significant heavy ETOH history ?-He is overweight with long-standing DM and so fatty liver is more likely ?-Primary source of anasarca is abdominal, ascites ?-RUQ [place] with apparent cirrhosis and mild to moderate ascites ?-MELD/MELD-Na score is 27, with a 57-monthmortality rate of 19.6% ?-Child-Pugh category is B, with an 80% 1-year survival ?-Continue spironolactone and lasix ?- s/p paracentesis on 4/23, removed 1.5 L ascites fluid; negative for SBP ?  ?Stage 4 CKD ?-Usual GFR appears to be in the 20s ?- repeat BMP at follow up ?  ?Acute on chronic diastolic CHF ?-Responded well to diuresis with Lasix ?- Echo repeated during hospitalization, EF 55 to 60%.  No regional WMA.  Moderate asymmetric left ventricular  hypertrophy of basal septal segment.  Indeterminate diastolic parameters.  RV systolic function severely reduced ?  ?CAD ?-s/p CABG (2013) and PCI (01/2021) ?-Continue Plavix, Crestor ?  ?Afib ?-Continue Coreg and Eliquis ?  ?Elevated TSH ?-11.7 [place] [facility] now 12.8 ?-His free T4 is normal ?-Given normal free T4, this may be sick euthyroid ?-Recommend f/u thyroid studies in 4-6 weeks ?  ?Charcot foot with PVD ?-Has been recommended for non-ambulatory status/strict NWB and is obtaining a wheelchair ?-He is at high risk for amputation ?-On Plavix ?-Continue Neurontin ?  ?HTN ?-Continue Coreg ?- resumed valsartan at d/c  ?  ?DM ?-Recent A1c was 4.6 ?- may no longer need insulin treatment ?  ?Obesity ?-Prior BMI was 31.6, current weight requested  ?-Weight loss encouraged ?-Outpatient PCP/bariatric medicine f/u encouraged  ? ? ?  ? ? ?Consultants: GI ?Procedures performed:   ?Disposition: Home ?Diet recommendation:  ?Discharge Diet Orders (From admission, onward)  ? ?  Start     Ordered  ? 05/09/21 0000  Diet - low sodium heart healthy       ? 05/09/21 1152  ? ?  ?  ? ?  ? ?Cardiac and Carb modified diet ?DISCHARGE MEDICATION: ?Allergies as of 05/09/2021   ? ?   Reactions  ? Lipitor [atorvastatin Calcium] Other (See Comments)  ? Weakness/ pain in legs  ? ?  ? ?  ?Medication List  ?  ? ?STOP taking these medications   ? ?loratadine 10 MG tablet ?Commonly known as: CLARITIN ?  ? ?  ? ?TAKE these medications   ? ?acetaminophen 650 MG  CR tablet ?Commonly known as: TYLENOL ?Take 650 mg by mouth every 8 (eight) hours as needed for pain. ?  ?albuterol (2.5 MG/3ML) 0.083% nebulizer solution ?Commonly known as: PROVENTIL ?Take 2.5 mg by nebulization every 6 (six) hours as needed for wheezing or shortness of breath. ?What changed: Another medication with the same name was changed. Make sure you understand how and when to take each. ?  ?albuterol 108 (90 Base) MCG/ACT inhaler ?Commonly known as: VENTOLIN HFA ?USE 2 PUFFS  EVERY 4 HOURS AS NEEDED FOR WHEEZING ?What changed: See the new instructions. ?  ?allopurinol 300 MG tablet ?Commonly known as: ZYLOPRIM ?Take 150 mg by mouth daily. ?  ?apixaban 2.5 MG Tabs tablet ?Commonly known as: ELIQUIS ?Take 2.5 mg by mouth 2 (two) times daily. ?  ?blood glucose meter kit and supplies ?Dispense based on patient and insurance preference. Use up to four times daily as directed. (FOR ICD-10 E10.9, E11.9). ?  ?carvedilol 6.25 MG tablet ?Commonly known as: COREG ?Take 1 tablet (6.25 mg total) by mouth 2 (two) times daily. ?  ?cetirizine 10 MG tablet ?Commonly known as: ZYRTEC ?Take 10 mg by mouth daily. ?  ?cholecalciferol 1000 units tablet ?Commonly known as: VITAMIN D ?Take 1,000 Units by mouth daily. ?  ?Cyanocobalamin 500 MCG Tbdp ?Take 1 tablet by mouth daily. ?  ?ferrous sulfate 325 (65 FE) MG tablet ?Take 650 mg by mouth every Monday, Wednesday, and Friday. ?  ?furosemide 40 MG tablet ?Commonly known as: LASIX ?TAKE 2 TABLETS BY MOUTH EVERY MORNING AND TAKE 1 TABLET IN THE AFTERNOON ?What changed:  ?how much to take ?how to take this ?when to take this ?additional instructions ?  ?gabapentin 100 MG capsule ?Commonly known as: NEURONTIN ?Take 100-300 mg by mouth 2 (two) times daily. 100 mg in the morning & 300 mg at bedtime ?  ?insulin glargine 100 UNIT/ML injection ?Commonly known as: LANTUS ?Inject 48 Units into the skin daily. ?  ?isosorbide mononitrate 30 MG 24 hr tablet ?Commonly known as: IMDUR ?Take 30 mg by mouth daily. ?  ?multivitamin capsule ?Take 1 capsule by mouth daily. ?  ?pantoprazole 40 MG tablet ?Commonly known as: PROTONIX ?Take 40 mg by mouth 2 (two) times daily. ?  ?potassium chloride 10 MEQ tablet ?Commonly known as: KLOR-CON M ?Take 10 mEq by mouth daily. ?  ?Semaglutide (1 MG/DOSE) 2 MG/1.5ML Sopn ?Inject 1 mg into the skin every Sunday. ?  ?sildenafil 50 MG tablet ?Commonly known as: VIAGRA ?May take 1-2 tablets as needed for sexual intercourse ?What changed:  ?how  much to take ?how to take this ?when to take this ?reasons to take this ?  ?spironolactone 25 MG tablet ?Commonly known as: ALDACTONE ?Take 1 tablet (25 mg total) by mouth daily. ?Start taking on: May 10, 2021 ?What changed: how much to take ?  ?testosterone cypionate 200 MG/ML injection ?Commonly known as: DEPOTESTOSTERONE CYPIONATE ?Inject 200 mg into the muscle every 14 (fourteen) days. ?  ?valsartan 80 MG tablet ?Commonly known as: DIOVAN ?Take 80 mg by mouth daily. ?  ?Zinc 50 MG Tabs ?Take 50 mg by mouth daily. ?  ? ?  ? ? Follow-up Information   ? ? [facility] Schedule an appointment as soon as possible for a visit in 1 week(s).   ?Contact information: ?[address] ?[place] [address] ?[phone] ? ? ?  ?  ? ? Nahser, Wonda Cheng, MD .   ?Specialty: Cardiology ?Contact information: ?[address] ?[address] ?[place] [address] ?[phone] ? ? ?  ?  ? ? Deboraha Sprang, MD .   ?Specialty: Cardiology ?Contact information: ?[address] ?[address] ?[place] [address] ?[phone] ? ? ?  ?  ? ?  ?  ? ?  ? ?Discharge Exam: ?Filed Weights  ? 05/07/21 0340 05/08/21 1222 05/09/21 0508  ?Weight: 110.4 kg 110.9 kg 108.4 kg  ? ?Physical Exam ?Constitutional:   ?   General: He is not in acute distress. ?   Appearance: Normal appearance.  ?HENT:  ?   Head: Normocephalic and atraumatic.  ?   Mouth/Throat:  ?   Mouth: Mucous membranes are moist.  ?Eyes:  ?   Extraocular Movements: Extraocular movements intact.  ?Cardiovascular:  ?   Rate and Rhythm: Normal rate and regular rhythm.  ?   Heart sounds: Normal heart sounds.  ?Pulmonary:  ?   Effort: Pulmonary effort is normal. No respiratory distress.  ?   Breath sounds: Normal breath sounds. No wheezing.  ?Abdominal:  ?   General: Bowel sounds are normal. There is no distension.  ?   Palpations: Abdomen is soft.  ?   Tenderness: There is no abdominal tenderness.  ?   Comments: Mildly swollen but soft and nontender   ?Musculoskeletal:     ?   General: Swelling present. Normal range of motion.  ?   Cervical back: Normal range of motion and neck supple.  ?Skin: ?   General: Skin is warm and dry.  ?Neurological:  ?   General

## 2021-05-09 NOTE — Discharge Instructions (Signed)
Ask your primary care provider about a sleep study to evaluate for sleep apnea.  ? ?Repeat BMP within 2 weeks. May need adjustment of potassium supplement or spironolactone.  ? ?Follow up with Gastroenterology.  ?

## 2021-05-11 LAB — HEPATITIS PANEL, ACUTE

## 2021-05-11 LAB — HCV INTERPRETATION

## 2021-05-12 LAB — CULTURE, BODY FLUID W GRAM STAIN -BOTTLE: Culture: NO GROWTH

## 2021-05-17 ENCOUNTER — Ambulatory Visit: Payer: Federal, State, Local not specified - PPO | Admitting: Nurse Practitioner

## 2021-09-25 ENCOUNTER — Emergency Department (HOSPITAL_COMMUNITY)
Admission: EM | Admit: 2021-09-25 | Discharge: 2021-09-26 | Disposition: A | Discharge status: Home / Self Care | Payer: No Typology Code available for payment source | Attending: Emergency Medicine | Admitting: Emergency Medicine

## 2021-09-25 ENCOUNTER — Emergency Department (HOSPITAL_COMMUNITY): Payer: No Typology Code available for payment source

## 2021-09-25 ENCOUNTER — Other Ambulatory Visit: Payer: Self-pay

## 2021-09-25 DIAGNOSIS — K5732 Diverticulitis of large intestine without perforation or abscess without bleeding: Secondary | ICD-10-CM | POA: Insufficient documentation

## 2021-09-25 DIAGNOSIS — R197 Diarrhea, unspecified: Secondary | ICD-10-CM | POA: Diagnosis present

## 2021-09-25 LAB — COMPREHENSIVE METABOLIC PANEL
ALT: 12 U/L (ref 0–44)
AST: 24 U/L (ref 15–41)
Albumin: 3.4 g/dL — ABNORMAL LOW (ref 3.5–5.0)
Alkaline Phosphatase: 141 U/L — ABNORMAL HIGH (ref 38–126)
Anion gap: 11 (ref 5–15)
BUN: 48 mg/dL — ABNORMAL HIGH (ref 8–23)
CO2: 28 mmol/L (ref 22–32)
Calcium: 9.1 mg/dL (ref 8.9–10.3)
Chloride: 99 mmol/L (ref 98–111)
Creatinine, Ser: 3.1 mg/dL — ABNORMAL HIGH (ref 0.61–1.24)
GFR, Estimated: 20 mL/min — ABNORMAL LOW (ref >60)
Glucose, Bld: 117 mg/dL — ABNORMAL HIGH (ref 70–99)
Potassium: 4.4 mmol/L (ref 3.5–5.1)
Sodium: 138 mmol/L (ref 135–145)
Total Bilirubin: 2 mg/dL — ABNORMAL HIGH (ref 0.3–1.2)
Total Protein: 7.7 g/dL (ref 6.5–8.1)

## 2021-09-25 LAB — URINALYSIS, ROUTINE W REFLEX MICROSCOPIC
Bilirubin Urine: NEGATIVE
Glucose, UA: NEGATIVE mg/dL
Hgb urine dipstick: NEGATIVE
Ketones, ur: NEGATIVE mg/dL
Nitrite: NEGATIVE
Protein, ur: NEGATIVE mg/dL
Specific Gravity, Urine: 1.009 (ref 1.005–1.030)
pH: 5 (ref 5.0–8.0)

## 2021-09-25 LAB — CBC
HCT: 42.2 % (ref 39.0–52.0)
Hemoglobin: 12.7 g/dL — ABNORMAL LOW (ref 13.0–17.0)
MCH: 25 pg — ABNORMAL LOW (ref 26.0–34.0)
MCHC: 30.1 g/dL (ref 30.0–36.0)
MCV: 83.1 fL (ref 80.0–100.0)
Platelets: 196 10*3/uL (ref 150–400)
RBC: 5.08 MIL/uL (ref 4.22–5.81)
RDW: 19.1 % — ABNORMAL HIGH (ref 11.5–15.5)
WBC: 12.7 10*3/uL — ABNORMAL HIGH (ref 4.0–10.5)
nRBC: 0 % (ref 0.0–0.2)

## 2021-09-25 LAB — LIPASE, BLOOD: Lipase: 45 U/L (ref 11–51)

## 2021-09-25 MED ORDER — AMOXICILLIN-POT CLAVULANATE 500-125 MG PO TABS: 1.0000 | ORAL_TABLET | Freq: Two times a day (BID) | ORAL | 0 refills | Status: DC

## 2021-09-25 MED ORDER — AMOXICILLIN-POT CLAVULANATE 500-125 MG PO TABS
1.0000 | ORAL_TABLET | Freq: Once | ORAL | Status: AC
  Administered 2021-09-26: 500 mg via ORAL
  Filled 2021-09-25: qty 1

## 2021-09-25 NOTE — ED Provider Notes (Signed)
[facility] Emergency Department Provider Note MRN:  [id number]  Arrival date & time: 09/25/21     Chief Complaint   Diarrhea   History of Present Illness   Edward [name] is a 81 y.o. year-old male presents to the ED with chief complaint of diarrhea and abdominal pain.  Has had abdominal pain for the past couple of days, but this has been improving and he feels better today than yesterday.  He reports associated non-bloody diarrhea.  Denies any fevers or chills.  He states that his pain is currently well controlled and he would like to go home.  History provided by patient.   Review of Systems  Pertinent positive and negative review of systems noted in HPI.    Physical Exam   Vitals:   09/25/21 1557 09/25/21 1954  BP: 116/70 (!) 140/76  Pulse: 81 66  Resp: 18 12  Temp: 98 F (36.7 C) 97.7 F (36.5 C)  SpO2: 99% 98%    CONSTITUTIONAL:  well-appearing, NAD NEURO:  Alert and oriented x 3, CN 3-12 grossly intact EYES:  eyes equal and reactive ENT/NECK:  Supple, no stridor  CARDIO:  normal rate, regular rhythm, appears well-perfused  PULM:  No respiratory distress, CTAB GI/GU:  non-distended, LLQ TTP MSK/SPINE:  No gross deformities, no edema, moves all extremities  SKIN:  no rash, atraumatic   *Additional and/or pertinent findings included in MDM below  Diagnostic and Interventional Summary    Labs Reviewed  COMPREHENSIVE METABOLIC PANEL - Abnormal; Notable for the following components:      Result Value   Glucose, Bld 117 (*)    BUN 48 (*)    Creatinine, Ser 3.10 (*)    Albumin 3.4 (*)    Alkaline Phosphatase 141 (*)    Total Bilirubin 2.0 (*)    GFR, Estimated 20 (*)    All other components within normal limits  CBC - Abnormal; Notable for the following components:   WBC 12.7 (*)    Hemoglobin 12.7 (*)    MCH 25.0 (*)    RDW 19.1 (*)    All other components within normal limits  URINALYSIS, ROUTINE W REFLEX MICROSCOPIC -  Abnormal; Notable for the following components:   Leukocytes,Ua MODERATE (*)    Bacteria, UA RARE (*)    All other components within normal limits  LIPASE, BLOOD    CT ABDOMEN PELVIS WO CONTRAST  Final Result      Medications  amoxicillin-clavulanate (AUGMENTIN) 500-125 MG per tablet 500 mg (has no administration in time range)     Procedures  /  Critical Care Procedures  ED Course and Medical Decision Making  I have reviewed the triage vital signs, the nursing notes, and pertinent available records from the EMR.  Social Determinants Affecting Complexity of Care: Patient has no clinically significant social determinants affecting this chief complaint..   ED Course:    Medical Decision Making Patient here with diarrhea and abdominal pain.  The abdominal pain has been improving, and he currently states that he is pain-free at rest.  He has pain medication at home.  CT scan notable for diverticulitis, no abscess or perforation.  I discussed treatment options with the patient, shared decision making was used.  Patient would like to be discharged on outpatient antibiotics.  Based on patient's well appearance, I feel that this is appropriate in this situation.  I will prescribe him renally dosed Augmentin.  I spoke with pharmacy regarding dosing.  Patient and  family are agreeable with this plan.  Return precautions discussed.  Problems Addressed: Diverticulitis of colon: acute illness or injury  Amount and/or Complexity of Data Reviewed Labs: ordered.    Details: Mild leukocytosis to 12.7, baseline creatinine of 3.1 Radiology: independent interpretation performed.    Details: No obvious large free air  Risk Prescription drug management.     Consultants: No consultations were needed in caring for this patient.   Treatment and Plan: I considered admission due to patient's initial presentation, but after considering the examination and diagnostic results, patient will not  require admission and can be discharged with outpatient follow-up.    Final Clinical Impressions(s) / ED Diagnoses     ICD-10-CM   1. Diverticulitis of colon  K57.32       ED Discharge Orders          Ordered    amoxicillin-clavulanate (AUGMENTIN) 500-125 MG tablet  2 times daily        09/25/21 2346              Discharge Instructions Discussed with and Provided to Patient:   Discharge Instructions   None      Montine Circle, PA-C 09/25/21 2355    Mesner, Corene Cornea, MD 09/26/21 [phone]

## 2021-09-25 NOTE — ED Provider Triage Note (Signed)
Emergency Medicine Provider Triage Evaluation Note  Edward [name] , a 81 y.o. male with history of liver cirrhosis, CAD, PAF, diabetes, CHF and recent diagnosis of bladder cancer with CKD stage IV was evaluated in triage.  Pt complains of left lower quadrant pain that started a couple of days ago and has progressively worsened.  He states last night, he had sudden onset of severe left lower quadrant pain reporting that it "felt as if someone punched him in the stomach".  His wife gave him hydrocodone and he placed a heating pack over the area which did provide some relief.  Patient is getting managed through the [place].  He had a CT abdomen and pelvis done on Friday to better characterize his bladder cancer.  He has that report with him today.  However, he was not feeling the abdominal pain at the time that he got that imaging done.  He went to the [place] again today and had the abdominal pain evaluated.  He was noted to have a leukocytosis of 15 and was referred to the ED for further evaluation.  Patient states that he is unable to get contrast due to his history of kidney disease.  Review of Systems  Positive:  Negative:   Physical Exam  BP 116/70 (BP Location: Right Arm)   Pulse 81   Temp 98 F (36.7 C) (Oral)   Resp 18   Ht 5' 10"  (1.778 m)   Wt 108 kg   SpO2 99%   BMI 34.15 kg/m  Gen:   Awake, no distress   Resp:  Normal effort  MSK:   Moves extremities without difficulty  Other:  Abdomen and soft,  Medical Decision Making  Medically screening exam initiated at 4:34 PM.  Appropriate orders placed.  Okechukwu Regnier Koerner was informed that the remainder of the evaluation will be completed by another provider, this initial triage assessment does not replace that evaluation, and the importance of remaining in the ED until their evaluation is complete.  Given history of kidney disease, CT abdomen pelvis without contrast ordered   Tonye Pearson, PA-C 09/25/21 1636

## 2021-09-25 NOTE — ED Triage Notes (Addendum)
Patient has been having diarrhea x 1 week hx of bladder cancer suppose to have cystoscopy on Friday at [facility]  Patient having mild abd pain. Patient sent by [facility] for WBC 15 and had CTA Friday with no acute findings but today has pain in LLQ now.

## 2021-11-13 ENCOUNTER — Other Ambulatory Visit (HOSPITAL_COMMUNITY): Payer: Self-pay | Admitting: Gastroenterology

## 2021-11-13 DIAGNOSIS — K7460 Unspecified cirrhosis of liver: Secondary | ICD-10-CM

## 2021-11-20 ENCOUNTER — Ambulatory Visit (HOSPITAL_COMMUNITY)
Admission: RE | Admit: 2021-11-20 | Discharge: 2021-11-20 | Disposition: A | Discharge status: Home / Self Care | Payer: No Typology Code available for payment source | Source: Ambulatory Visit | Attending: Gastroenterology | Admitting: Gastroenterology

## 2021-11-20 DIAGNOSIS — K7460 Unspecified cirrhosis of liver: Secondary | ICD-10-CM | POA: Insufficient documentation

## 2022-03-20 ENCOUNTER — Observation Stay (HOSPITAL_COMMUNITY): Payer: No Typology Code available for payment source

## 2022-03-20 ENCOUNTER — Encounter (HOSPITAL_COMMUNITY): Payer: Self-pay | Admitting: Emergency Medicine

## 2022-03-20 ENCOUNTER — Other Ambulatory Visit: Payer: Self-pay

## 2022-03-20 ENCOUNTER — Inpatient Hospital Stay (HOSPITAL_COMMUNITY)
Admission: EM | Admit: 2022-03-20 | Discharge: 2022-03-25 | DRG: 602 | Disposition: A | Discharge status: Home / Self Care | Payer: No Typology Code available for payment source | Source: Ambulatory Visit | Attending: Family Medicine | Admitting: Family Medicine

## 2022-03-20 ENCOUNTER — Emergency Department (HOSPITAL_BASED_OUTPATIENT_CLINIC_OR_DEPARTMENT_OTHER): Payer: No Typology Code available for payment source

## 2022-03-20 ENCOUNTER — Emergency Department (HOSPITAL_COMMUNITY): Payer: No Typology Code available for payment source

## 2022-03-20 DIAGNOSIS — I5030 Unspecified diastolic (congestive) heart failure: Secondary | ICD-10-CM

## 2022-03-20 DIAGNOSIS — K760 Fatty (change of) liver, not elsewhere classified: Secondary | ICD-10-CM | POA: Diagnosis present

## 2022-03-20 DIAGNOSIS — E1140 Type 2 diabetes mellitus with diabetic neuropathy, unspecified: Secondary | ICD-10-CM | POA: Diagnosis present

## 2022-03-20 DIAGNOSIS — Z1152 Encounter for screening for COVID-19: Secondary | ICD-10-CM

## 2022-03-20 DIAGNOSIS — Z955 Presence of coronary angioplasty implant and graft: Secondary | ICD-10-CM

## 2022-03-20 DIAGNOSIS — M7989 Other specified soft tissue disorders: Secondary | ICD-10-CM

## 2022-03-20 DIAGNOSIS — Z79890 Hormone replacement therapy (postmenopausal): Secondary | ICD-10-CM

## 2022-03-20 DIAGNOSIS — E1122 Type 2 diabetes mellitus with diabetic chronic kidney disease: Secondary | ICD-10-CM | POA: Diagnosis present

## 2022-03-20 DIAGNOSIS — N184 Chronic kidney disease, stage 4 (severe): Secondary | ICD-10-CM | POA: Diagnosis present

## 2022-03-20 DIAGNOSIS — Z950 Presence of cardiac pacemaker: Secondary | ICD-10-CM

## 2022-03-20 DIAGNOSIS — N179 Acute kidney failure, unspecified: Secondary | ICD-10-CM | POA: Diagnosis present

## 2022-03-20 DIAGNOSIS — I959 Hypotension, unspecified: Secondary | ICD-10-CM | POA: Diagnosis not present

## 2022-03-20 DIAGNOSIS — E785 Hyperlipidemia, unspecified: Secondary | ICD-10-CM | POA: Diagnosis present

## 2022-03-20 DIAGNOSIS — I10 Essential (primary) hypertension: Secondary | ICD-10-CM | POA: Diagnosis present

## 2022-03-20 DIAGNOSIS — Z7901 Long term (current) use of anticoagulants: Secondary | ICD-10-CM

## 2022-03-20 DIAGNOSIS — K766 Portal hypertension: Secondary | ICD-10-CM | POA: Diagnosis present

## 2022-03-20 DIAGNOSIS — C679 Malignant neoplasm of bladder, unspecified: Secondary | ICD-10-CM | POA: Diagnosis present

## 2022-03-20 DIAGNOSIS — Z9049 Acquired absence of other specified parts of digestive tract: Secondary | ICD-10-CM

## 2022-03-20 DIAGNOSIS — I509 Heart failure, unspecified: Secondary | ICD-10-CM

## 2022-03-20 DIAGNOSIS — Z951 Presence of aortocoronary bypass graft: Secondary | ICD-10-CM

## 2022-03-20 DIAGNOSIS — J449 Chronic obstructive pulmonary disease, unspecified: Secondary | ICD-10-CM | POA: Diagnosis present

## 2022-03-20 DIAGNOSIS — D696 Thrombocytopenia, unspecified: Secondary | ICD-10-CM | POA: Diagnosis present

## 2022-03-20 DIAGNOSIS — E8770 Fluid overload, unspecified: Secondary | ICD-10-CM

## 2022-03-20 DIAGNOSIS — K7460 Unspecified cirrhosis of liver: Secondary | ICD-10-CM | POA: Diagnosis present

## 2022-03-20 DIAGNOSIS — Z96652 Presence of left artificial knee joint: Secondary | ICD-10-CM | POA: Diagnosis present

## 2022-03-20 DIAGNOSIS — Z83438 Family history of other disorder of lipoprotein metabolism and other lipidemia: Secondary | ICD-10-CM

## 2022-03-20 DIAGNOSIS — E871 Hypo-osmolality and hyponatremia: Secondary | ICD-10-CM | POA: Diagnosis present

## 2022-03-20 DIAGNOSIS — Z800 Family history of malignant neoplasm of digestive organs: Secondary | ICD-10-CM

## 2022-03-20 DIAGNOSIS — D631 Anemia in chronic kidney disease: Secondary | ICD-10-CM | POA: Diagnosis present

## 2022-03-20 DIAGNOSIS — I5031 Acute diastolic (congestive) heart failure: Secondary | ICD-10-CM

## 2022-03-20 DIAGNOSIS — I5033 Acute on chronic diastolic (congestive) heart failure: Secondary | ICD-10-CM | POA: Diagnosis present

## 2022-03-20 DIAGNOSIS — I480 Paroxysmal atrial fibrillation: Secondary | ICD-10-CM | POA: Diagnosis present

## 2022-03-20 DIAGNOSIS — L0390 Cellulitis, unspecified: Secondary | ICD-10-CM | POA: Diagnosis present

## 2022-03-20 DIAGNOSIS — Z8249 Family history of ischemic heart disease and other diseases of the circulatory system: Secondary | ICD-10-CM

## 2022-03-20 DIAGNOSIS — E1165 Type 2 diabetes mellitus with hyperglycemia: Secondary | ICD-10-CM | POA: Diagnosis present

## 2022-03-20 DIAGNOSIS — Z794 Long term (current) use of insulin: Secondary | ICD-10-CM

## 2022-03-20 DIAGNOSIS — E119 Type 2 diabetes mellitus without complications: Secondary | ICD-10-CM

## 2022-03-20 DIAGNOSIS — I130 Hypertensive heart and chronic kidney disease with heart failure and stage 1 through stage 4 chronic kidney disease, or unspecified chronic kidney disease: Secondary | ICD-10-CM | POA: Diagnosis present

## 2022-03-20 DIAGNOSIS — Z86010 Personal history of colonic polyps: Secondary | ICD-10-CM

## 2022-03-20 DIAGNOSIS — I2510 Atherosclerotic heart disease of native coronary artery without angina pectoris: Secondary | ICD-10-CM | POA: Diagnosis present

## 2022-03-20 DIAGNOSIS — L03116 Cellulitis of left lower limb: Principal | ICD-10-CM | POA: Diagnosis present

## 2022-03-20 DIAGNOSIS — Z888 Allergy status to other drugs, medicaments and biological substances status: Secondary | ICD-10-CM

## 2022-03-20 DIAGNOSIS — E1151 Type 2 diabetes mellitus with diabetic peripheral angiopathy without gangrene: Secondary | ICD-10-CM | POA: Diagnosis present

## 2022-03-20 DIAGNOSIS — R791 Abnormal coagulation profile: Secondary | ICD-10-CM | POA: Diagnosis present

## 2022-03-20 DIAGNOSIS — Z87891 Personal history of nicotine dependence: Secondary | ICD-10-CM

## 2022-03-20 DIAGNOSIS — Z8673 Personal history of transient ischemic attack (TIA), and cerebral infarction without residual deficits: Secondary | ICD-10-CM

## 2022-03-20 DIAGNOSIS — E8809 Other disorders of plasma-protein metabolism, not elsewhere classified: Secondary | ICD-10-CM | POA: Diagnosis present

## 2022-03-20 DIAGNOSIS — K219 Gastro-esophageal reflux disease without esophagitis: Secondary | ICD-10-CM | POA: Diagnosis present

## 2022-03-20 DIAGNOSIS — Z79899 Other long term (current) drug therapy: Secondary | ICD-10-CM

## 2022-03-20 HISTORY — DX: Acute on chronic diastolic (congestive) heart failure: I50.33

## 2022-03-20 LAB — SEDIMENTATION RATE: Sed Rate: 30 mm/hr — ABNORMAL HIGH (ref 0–16)

## 2022-03-20 LAB — CBC WITH DIFFERENTIAL/PLATELET
Abs Immature Granulocytes: 0.08 10*3/uL — ABNORMAL HIGH (ref 0.00–0.07)
Basophils Absolute: 0.1 10*3/uL (ref 0.0–0.1)
Basophils Relative: 1 %
Eosinophils Absolute: 0.3 10*3/uL (ref 0.0–0.5)
Eosinophils Relative: 2 %
HCT: 33.4 % — ABNORMAL LOW (ref 39.0–52.0)
Hemoglobin: 9.7 g/dL — ABNORMAL LOW (ref 13.0–17.0)
Immature Granulocytes: 1 %
Lymphocytes Relative: 5 %
Lymphs Abs: 0.7 10*3/uL (ref 0.7–4.0)
MCH: 26.4 pg (ref 26.0–34.0)
MCHC: 29 g/dL — ABNORMAL LOW (ref 30.0–36.0)
MCV: 90.8 fL (ref 80.0–100.0)
Monocytes Absolute: 1.3 10*3/uL — ABNORMAL HIGH (ref 0.1–1.0)
Monocytes Relative: 9 %
Neutro Abs: 12.1 10*3/uL — ABNORMAL HIGH (ref 1.7–7.7)
Neutrophils Relative %: 82 %
Platelets: 104 10*3/uL — ABNORMAL LOW (ref 150–400)
RBC: 3.68 MIL/uL — ABNORMAL LOW (ref 4.22–5.81)
RDW: 18.3 % — ABNORMAL HIGH (ref 11.5–15.5)
WBC: 14.5 10*3/uL — ABNORMAL HIGH (ref 4.0–10.5)
nRBC: 0 % (ref 0.0–0.2)

## 2022-03-20 LAB — COMPREHENSIVE METABOLIC PANEL
ALT: 12 U/L (ref 0–44)
AST: 26 U/L (ref 15–41)
Albumin: 3.1 g/dL — ABNORMAL LOW (ref 3.5–5.0)
Alkaline Phosphatase: 115 U/L (ref 38–126)
Anion gap: 11 (ref 5–15)
BUN: 73 mg/dL — ABNORMAL HIGH (ref 8–23)
CO2: 22 mmol/L (ref 22–32)
Calcium: 8.6 mg/dL — ABNORMAL LOW (ref 8.9–10.3)
Chloride: 102 mmol/L (ref 98–111)
Creatinine, Ser: 5.04 mg/dL — ABNORMAL HIGH (ref 0.61–1.24)
GFR, Estimated: 11 mL/min — ABNORMAL LOW (ref >60)
Glucose, Bld: 127 mg/dL — ABNORMAL HIGH (ref 70–99)
Potassium: 4.3 mmol/L (ref 3.5–5.1)
Sodium: 135 mmol/L (ref 135–145)
Total Bilirubin: 1.9 mg/dL — ABNORMAL HIGH (ref 0.3–1.2)
Total Protein: 7 g/dL (ref 6.5–8.1)

## 2022-03-20 LAB — C-REACTIVE PROTEIN: CRP: 18.6 mg/dL — ABNORMAL HIGH (ref <1.0)

## 2022-03-20 LAB — TSH: TSH: 6.516 u[IU]/mL — ABNORMAL HIGH (ref 0.350–4.500)

## 2022-03-20 LAB — CBG MONITORING, ED: Glucose-Capillary: 121 mg/dL — ABNORMAL HIGH (ref 70–99)

## 2022-03-20 LAB — GLUCOSE, CAPILLARY: Glucose-Capillary: 149 mg/dL — ABNORMAL HIGH (ref 70–99)

## 2022-03-20 LAB — HIV ANTIBODY (ROUTINE TESTING W REFLEX): HIV Screen 4th Generation wRfx: NONREACTIVE

## 2022-03-20 LAB — RESP PANEL BY RT-PCR (RSV, FLU A&B, COVID)  RVPGX2
Influenza A by PCR: NEGATIVE
Influenza B by PCR: NEGATIVE
Resp Syncytial Virus by PCR: NEGATIVE
SARS Coronavirus 2 by RT PCR: NEGATIVE

## 2022-03-20 LAB — BRAIN NATRIURETIC PEPTIDE: B Natriuretic Peptide: 131.2 pg/mL — ABNORMAL HIGH (ref 0.0–100.0)

## 2022-03-20 LAB — LACTIC ACID, PLASMA: Lactic Acid, Venous: 1.7 mmol/L (ref 0.5–1.9)

## 2022-03-20 MED ORDER — VITAMIN B-12 1000 MCG PO TABS
1000.0000 ug | ORAL_TABLET | Freq: Every day | ORAL | Status: DC
  Administered 2022-03-21 – 2022-03-25 (×5): 1000 ug via ORAL
  Filled 2022-03-20 (×5): qty 1

## 2022-03-20 MED ORDER — ALBUTEROL SULFATE (2.5 MG/3ML) 0.083% IN NEBU: 2.5000 mg | INHALATION_SOLUTION | Freq: Four times a day (QID) | RESPIRATORY_TRACT | Status: DC | PRN

## 2022-03-20 MED ORDER — SPIRONOLACTONE 12.5 MG HALF TABLET: 25.0000 mg | ORAL_TABLET | Freq: Every day | ORAL | Status: DC

## 2022-03-20 MED ORDER — ALBUTEROL SULFATE HFA 108 (90 BASE) MCG/ACT IN AERS: 2.0000 | INHALATION_SPRAY | Freq: Four times a day (QID) | RESPIRATORY_TRACT | Status: DC | PRN

## 2022-03-20 MED ORDER — ACETAMINOPHEN 325 MG PO TABS: 650.0000 mg | ORAL_TABLET | Freq: Three times a day (TID) | ORAL | Status: DC | PRN

## 2022-03-20 MED ORDER — CARVEDILOL 3.125 MG PO TABS
6.2500 mg | ORAL_TABLET | Freq: Two times a day (BID) | ORAL | Status: DC
  Administered 2022-03-21 – 2022-03-25 (×9): 6.25 mg via ORAL
  Filled 2022-03-20 (×9): qty 2

## 2022-03-20 MED ORDER — ONDANSETRON HCL 4 MG/2ML IJ SOLN
4.0000 mg | Freq: Once | INTRAMUSCULAR | Status: AC
  Administered 2022-03-20: 4 mg via INTRAVENOUS
  Filled 2022-03-20: qty 2

## 2022-03-20 MED ORDER — APIXABAN 2.5 MG PO TABS
2.5000 mg | ORAL_TABLET | Freq: Two times a day (BID) | ORAL | Status: DC
  Administered 2022-03-20 – 2022-03-25 (×10): 2.5 mg via ORAL
  Filled 2022-03-20 (×10): qty 1

## 2022-03-20 MED ORDER — GABAPENTIN 100 MG PO CAPS
100.0000 mg | ORAL_CAPSULE | Freq: Two times a day (BID) | ORAL | Status: DC
  Administered 2022-03-20 – 2022-03-25 (×10): 100 mg via ORAL
  Filled 2022-03-20 (×10): qty 1

## 2022-03-20 MED ORDER — VITAMIN D 25 MCG (1000 UNIT) PO TABS
1000.0000 [IU] | ORAL_TABLET | Freq: Every day | ORAL | Status: DC
  Administered 2022-03-21 – 2022-03-25 (×5): 1000 [IU] via ORAL
  Filled 2022-03-20 (×5): qty 1

## 2022-03-20 MED ORDER — VANCOMYCIN HCL IN DEXTROSE 1-5 GM/200ML-% IV SOLN
1000.0000 mg | Freq: Once | INTRAVENOUS | Status: AC
  Administered 2022-03-20: 1000 mg via INTRAVENOUS
  Filled 2022-03-20: qty 200

## 2022-03-20 MED ORDER — LEVOTHYROXINE SODIUM 25 MCG PO TABS
25.0000 ug | ORAL_TABLET | Freq: Every day | ORAL | Status: DC
  Administered 2022-03-21 – 2022-03-25 (×5): 25 ug via ORAL
  Filled 2022-03-20 (×5): qty 1

## 2022-03-20 MED ORDER — FENTANYL CITRATE PF 50 MCG/ML IJ SOSY
50.0000 ug | PREFILLED_SYRINGE | Freq: Once | INTRAMUSCULAR | Status: AC
  Administered 2022-03-20: 50 ug via INTRAVENOUS
  Filled 2022-03-20: qty 1

## 2022-03-20 MED ORDER — ALLOPURINOL 100 MG PO TABS
50.0000 mg | ORAL_TABLET | ORAL | Status: DC
  Administered 2022-03-22: 50 mg via ORAL
  Filled 2022-03-20 (×3): qty 1

## 2022-03-20 MED ORDER — FUROSEMIDE 10 MG/ML IJ SOLN
80.0000 mg | Freq: Once | INTRAMUSCULAR | Status: AC
  Administered 2022-03-20: 80 mg via INTRAVENOUS
  Filled 2022-03-20: qty 8

## 2022-03-20 MED ORDER — INSULIN ASPART 100 UNIT/ML IJ SOLN
0.0000 [IU] | Freq: Three times a day (TID) | INTRAMUSCULAR | Status: DC
  Administered 2022-03-21: 1 [IU] via SUBCUTANEOUS
  Administered 2022-03-22: 2 [IU] via SUBCUTANEOUS
  Administered 2022-03-22 – 2022-03-25 (×5): 1 [IU] via SUBCUTANEOUS

## 2022-03-20 MED ORDER — ZINC 50 MG PO TABS: 50.0000 mg | ORAL_TABLET | Freq: Every day | ORAL | Status: DC

## 2022-03-20 MED ORDER — ZINC SULFATE 220 (50 ZN) MG PO CAPS
220.0000 mg | ORAL_CAPSULE | Freq: Every day | ORAL | Status: DC
  Administered 2022-03-21 – 2022-03-25 (×5): 220 mg via ORAL
  Filled 2022-03-20 (×5): qty 1

## 2022-03-20 MED ORDER — CYANOCOBALAMIN 500 MCG PO TBDP: 1.0000 | ORAL_TABLET | Freq: Every day | ORAL | Status: DC

## 2022-03-20 MED ORDER — PANTOPRAZOLE SODIUM 40 MG PO TBEC
40.0000 mg | DELAYED_RELEASE_TABLET | Freq: Two times a day (BID) | ORAL | Status: DC
  Administered 2022-03-20 – 2022-03-25 (×10): 40 mg via ORAL
  Filled 2022-03-20 (×10): qty 1

## 2022-03-20 MED ORDER — FUROSEMIDE 10 MG/ML IJ SOLN
20.0000 mg | Freq: Once | INTRAMUSCULAR | Status: AC
  Administered 2022-03-20: 20 mg via INTRAVENOUS
  Filled 2022-03-20: qty 2

## 2022-03-20 NOTE — Progress Notes (Signed)
Lower extremity venous left study completed.  Preliminary results relayed to [place], [place].   See CV Proc for preliminary results report.   Darlin Coco, RDMS, RVT

## 2022-03-20 NOTE — Hospital Course (Addendum)
Edward [name] is a 82 y.o.male with a history of CAD, s/p CABG, Afib on eliquis, cirrhosis, CKD4, COPD, CHF, hx of tubular adenoma of the colon, PAD, and diabetes who was admitted to the [facility] at [facility] for acute HF exacerbation. His hospital course is detailed below:  Acute exacerbation of CHF (congestive heart failure)  Patient on admission appears volume overloaded with increased edema on LE bilateral lower extremity, increased weight from baseline and pulmonary congestion seen on x-ray.  BNP was 131.2.  Patient was diuresed during admission with IV Lasix and had a total UOP ~11L by discharge.  Cellulitis Ultrasound did not show drainable fluid collection. Remained Afebrile and WBC down-trending on Abx. Was treated with IV vanc and eventual switched to PO doxycycline prior to discharge.  Will continue doxycycline with last date of 03/30/2022 to complete 10 days course of antibiotics.  AKI (acute kidney injury)  Creatinine on admission was 5.41.  Likely prerenal secondary to poor renal perfusion.  Creatinine improved by the time of discharge as we held some of his nephrotoxic medications including valsartan.  Cirrhosis of liver RUQ demonstrating small ascites. GI recommend outpatient follow-up, and unclear cause of ascites, given lack of access.  Autoimmune labs including anti-smooth muscle antibody were negative.  PCP Follow-up Recommendations: Please ensure that patient follows up with GI outpatient. He will need [place] screening every 6 months.  Advised to follow low sodium diet (no more than 2g)  Monitor renal function with BMP at follow up to decide when to restart spironolactone and valsartan. Patient started on midodrine 5 mg 3 times daily per nephrology Autoimmune lab follow-up Blood pressure medication held at discharge due to hypotension, recheck BP outpatient and add back as indicated. Left ankle lesion, please follow up with dermatology outpatient.

## 2022-03-20 NOTE — Assessment & Plan Note (Addendum)
Creatinine improved to 3.72 -Nephrology consulted, recs appreciated -Monitor kidney function with diuresis  -AM CMP -Hold home Valsartan

## 2022-03-20 NOTE — ED Triage Notes (Signed)
Patient with redness to the L knee that started this past Sunday. Patient was sent of over from the [place]. Denies pain to the knee. Denies of any known fever but did endorse chills intermittently.

## 2022-03-20 NOTE — Assessment & Plan Note (Addendum)
Well controlled.  -Very sensitive sliding scale -CBGs with meals and at night

## 2022-03-20 NOTE — ED Notes (Signed)
ED TO INPATIENT HANDOFF REPORT  ED Nurse Name and Phone #:  Berdene Askari [phone]  S Name/Age/Gender Edward [name] 82 y.o. male Room/Bed: TRACC/TRACC  Code Status   Code Status: Prior  Home/SNF/Other Home Patient oriented to: self, place, time, and situation Is this baseline? Yes   Triage Complete: Triage complete  Chief Complaint Leg swelling [M79.89]  Triage Note Patient with redness to the L knee that started this past Sunday. Patient was sent of over from the [place]. Denies pain to the knee. Denies of any known fever but did endorse chills intermittently.    Allergies Allergies  Allergen Reactions   Lipitor [Atorvastatin Calcium] Other (See Comments)    Weakness/ pain in legs    Level of Care/Admitting Diagnosis ED Disposition     ED Disposition  Admit   Condition  --   [place]: [place] [[id number]]  Level of Care: Telemetry Cardiac [103]  May place patient in observation at [facility] or [place] if equivalent level of care is available:: No  Covid Evaluation: Asymptomatic - no recent exposure (last 10 days) testing not required  Diagnosis: Leg swelling [id number]  Admitting Physician: Kinnie Feil [[id number]]  Attending Physician: Marla Roe          B Medical/Surgery History Past Medical History:  Diagnosis Date   Anemia    ?   Carotid artery occlusion    Chronic diastolic CHF (congestive heart failure) ([place]) 04/23/2012   CKD (chronic kidney disease), stage III (HCC)    Claudication (HCC)    Coronary artery disease    s/p CABG 2013   Diabetic neuropathy (HCC)    mostly feet/ legs   Diverticulosis    Dyslipidemia    Exertional shortness of breath    GERD (gastroesophageal reflux disease)    GI bleed    while on triple anticoag therapy   HTN (hypertension)    Pacemaker    PAF (paroxysmal atrial fibrillation) ([place])    s/p MAZE at time of CABG   PVD (peripheral vascular disease) ([place])    R CEA 2011,  prior PTA, left fem-pop 2014   Stroke ([place])    vs TIA   Tubular adenoma of colon    Type II diabetes mellitus ([place])    Past Surgical History:  Procedure Laterality Date   ABDOMINAL AORTAGRAM N/A 01/07/2012   Procedure: ABDOMINAL Maxcine Ham;  Surgeon: Angelia Mould, MD;  Location: [facility] CATH LAB;  Service: Cardiovascular;  Laterality: N/A;   BALLOON DILATION N/A 02/17/2013   Procedure: BALLOON DILATION;  Surgeon: Jerene Bears, MD;  Location: [facility] ENDOSCOPY;  Service: Gastroenterology;  Laterality: N/A;   BIOPSY  03/17/2021   Procedure: BIOPSY;  Surgeon: [name], MD;  Location: [place];  Service: Gastroenterology;;   CAROTID ENDARTERECTOMY Right 03/2009   CATARACT EXTRACTION Bilateral    CHOLECYSTECTOMY     CIRCUMCISION     COLONOSCOPY WITH PROPOFOL N/A 02/17/2013   Procedure: COLONOSCOPY WITH PROPOFOL;  Surgeon: Jerene Bears, MD;  Location: [facility];  Service: Gastroenterology;  Laterality: N/A;   COLONOSCOPY WITH PROPOFOL N/A 03/18/2021   Procedure: COLONOSCOPY WITH PROPOFOL;  Surgeon: Carol Ada, MD;  Location: [place];  Service: Gastroenterology;  Laterality: N/A;   CORONARY ARTERY BYPASS GRAFT  06/14/2011   Procedure: CORONARY ARTERY BYPASS GRAFTING (CABG);  Surgeon: Gaye Pollack, MD;  Location: [place];  Service: Open Heart Surgery;  Laterality: N/A;  Coronary Artery Bypass Graft on  pump times six;  utilizing internal mammary artery and right greater saphenous vein harvested endoscopically.x 5 vessels   ELECTROPHYSIOLOGY STUDY N/A 11/05/2012   Procedure: ELECTROPHYSIOLOGY STUDY;  Surgeon: Deboraha Sprang, MD;  Location: [facility] CATH LAB;  Service: Cardiovascular;  Laterality: N/A;   ESOPHAGOGASTRODUODENOSCOPY (EGD) WITH PROPOFOL N/A 02/17/2013   Procedure: ESOPHAGOGASTRODUODENOSCOPY (EGD) WITH PROPOFOL;  Surgeon: Jerene Bears, MD;  [facility];  Service: Gastroenterology;  Laterality: N/A;   ESOPHAGOGASTRODUODENOSCOPY (EGD) WITH PROPOFOL N/A 03/17/2021   Procedure:  ESOPHAGOGASTRODUODENOSCOPY (EGD) WITH PROPOFOL;  Surgeon: [name], MD;  Location: [place];  Service: Gastroenterology;  Laterality: N/A;   FEMORAL-POPLITEAL BYPASS GRAFT Left 03/06/2012   Procedure: BYPASS GRAFT FEMORAL-POPLITEAL ARTERY;  Surgeon: Angelia Mould, MD;  Location: [place];  Service: Vascular;  Laterality: Left;  Left Femoral - Below Knee Popliteal Bypass Graft with Vein and Intraoperative Arteriogram.   JOINT REPLACEMENT Left    left TKA   KNEE ARTHROSCOPY Left    "I had 3" (11/05/2012)   LEAD REVISION  11/05/2012   pacemaker/notes 11/05/2012   LEAD REVISION N/A 11/05/2012   Procedure: LEAD REVISION;  Surgeon: Deboraha Sprang, MD;  Location: [facility] CATH LAB;  Service: Cardiovascular;  Laterality: N/A;   MAZE  06/14/2011   Procedure: MAZE;  Surgeon: Gaye Pollack, MD;  Location: [place];  Service: Open Heart Surgery;  Laterality: N/A;  [place]igate left atrial appendage   PERMANENT PACEMAKER INSERTION N/A 06/20/2011   Procedure: PERMANENT PACEMAKER INSERTION;  Surgeon: Deboraha Sprang, MD;  Location: [facility] CATH LAB;  Service: Cardiovascular;  Laterality: N/A;   POLYPECTOMY  03/18/2021   Procedure: POLYPECTOMY;  Surgeon: Carol Ada, MD;  Location: [place];  Service: Gastroenterology;;   TONSILLECTOMY AND ADENOIDECTOMY     TOTAL KNEE ARTHROPLASTY Left      A IV Location/Drains/Wounds Patient Lines/Drains/Airways Status     Active Line/Drains/Airways     Name Placement date Placement time Site Days   Peripheral IV 03/20/22 20 G Right Antecubital 03/20/22  1150  Antecubital  less than 1   Peripheral IV 03/20/22 20 G Anterior;Distal;Left;Upper Antecubital 03/20/22  1150  Antecubital  less than 1   Wound / Incision (Open or Dehisced) 03/18/21 Skin tear Arm Posterior;Right;Upper 03/18/21  1940  Arm  367            Intake/Output Last 24 hours No intake or output data in the 24 hours ending 03/20/22 1527  Labs/Imaging Results for orders placed or performed during the  hospital encounter of 03/20/22 (from the past 48 hour(s))  Lactic acid     Status: None   Collection Time: 03/20/22 11:12 AM  Result Value Ref Range   Lactic Acid, Venous 1.7 0.5 - 1.9 mmol/L    Comment: Performed at [facility], [place] [address] [place], [place] [address]  Resp panel by RT-PCR (RSV, Flu A&B, Covid) Peripheral     Status: None   Collection Time: 03/20/22 11:14 AM   Specimen: Peripheral; Nasal Swab  Result Value Ref Range   SARS Coronavirus 2 by RT PCR NEGATIVE NEGATIVE   Influenza A by PCR NEGATIVE NEGATIVE   Influenza B by PCR NEGATIVE NEGATIVE    Comment: (NOTE) The Xpert Xpress SARS-CoV-2/FLU/RSV plus assay is intended as an aid in the diagnosis of influenza from Nasopharyngeal swab specimens and should not be used as a sole basis for treatment. Nasal washings and aspirates are unacceptable for Xpert Xpress SARS-CoV-2/FLU/RSV testing.  Fact Sheet for Patients: EntrepreneurPulse.com.au  Fact Sheet for Healthcare Providers: IncredibleEmployment.be  This test is not yet approved or cleared by the [place] FDA and has been authorized for detection and/or diagnosis of SARS-CoV-2 by FDA under an Emergency Use Authorization (EUA). This EUA will remain in effect (meaning this test can be used) for the duration of the COVID-19 declaration under Section 564(b)(1) of the Act, 21 U.S.C. section 360bbb-3(b)(1), unless the authorization is terminated or revoked.     Resp Syncytial Virus by PCR NEGATIVE NEGATIVE    Comment: (NOTE) Fact Sheet for Patients: EntrepreneurPulse.com.au  Fact Sheet for Healthcare Providers: IncredibleEmployment.be  This test is not yet approved or cleared by the [place] FDA and has been authorized for detection and/or diagnosis of SARS-CoV-2 by FDA under an Emergency Use Authorization (EUA). This EUA will remain in effect (meaning this test can be used)  for the duration of the COVID-19 declaration under Section 564(b)(1) of the Act, 21 U.S.C. section 360bbb-3(b)(1), unless the authorization is terminated or revoked.  Performed at [facility], [place] [address], [place], [place] [address]   CBG monitoring, ED     Status: Abnormal   Collection Time: 03/20/22 11:36 AM  Result Value Ref Range   Glucose-Capillary 121 (H) 70 - 99 mg/dL    Comment: Glucose reference range applies only to samples taken after fasting for at least 8 hours.  CBC with Differential     Status: Abnormal   Collection Time: 03/20/22 11:37 AM  Result Value Ref Range   WBC 14.5 (H) 4.0 - 10.5 K/uL   RBC 3.68 (L) 4.22 - 5.81 MIL/uL   Hemoglobin 9.7 (L) 13.0 - 17.0 g/dL   HCT 33.4 (L) 39.0 - 52.0 %   MCV 90.8 80.0 - 100.0 fL   MCH 26.4 26.0 - 34.0 pg   MCHC 29.0 (L) 30.0 - 36.0 g/dL   RDW 18.3 (H) 11.5 - 15.5 %   Platelets 104 (L) 150 - 400 K/uL    Comment: REPEATED TO VERIFY   nRBC 0.0 0.0 - 0.2 %   Neutrophils Relative % 82 %   Neutro Abs 12.1 (H) 1.7 - 7.7 K/uL   Lymphocytes Relative 5 %   Lymphs Abs 0.7 0.7 - 4.0 K/uL   Monocytes Relative 9 %   Monocytes Absolute 1.3 (H) 0.1 - 1.0 K/uL   Eosinophils Relative 2 %   Eosinophils Absolute 0.3 0.0 - 0.5 K/uL   Basophils Relative 1 %   Basophils Absolute 0.1 0.0 - 0.1 K/uL   Immature Granulocytes 1 %   Abs Immature Granulocytes 0.08 (H) 0.00 - 0.07 K/uL    Comment: Performed at [facility], [address], [place],  [address]  Comprehensive metabolic panel     Status: Abnormal   Collection Time: 03/20/22 11:37 AM  Result Value Ref Range   Sodium 135 135 - 145 mmol/L   Potassium 4.3 3.5 - 5.1 mmol/L   Chloride 102 98 - 111 mmol/L   CO2 22 22 - 32 mmol/L   Glucose, Bld 127 (H) 70 - 99 mg/dL    Comment: Glucose reference range applies only to samples taken after fasting for at least 8 hours.   BUN 73 (H) 8 - 23 mg/dL   Creatinine, Ser 5.04 (H) 0.61 - 1.24 mg/dL   Calcium 8.6 (L) 8.9 -  10.3 mg/dL   Total Protein 7.0 6.5 - 8.1 g/dL   Albumin 3.1 (L) 3.5 - 5.0 g/dL   AST 26 15 -  41 U/L   ALT 12 0 - 44 U/L   Alkaline Phosphatase 115 38 - 126 U/L   Total Bilirubin 1.9 (H) 0.3 - 1.2 mg/dL   GFR, Estimated 11 (L) >60 mL/min    Comment: (NOTE) Calculated using the CKD-EPI Creatinine Equation (2021)    Anion gap 11 5 - 15    Comment: Performed at [place] [address], [place], [place] [address]  Brain natriuretic peptide     Status: Abnormal   Collection Time: 03/20/22 11:37 AM  Result Value Ref Range   B Natriuretic Peptide 131.2 (H) 0.0 - 100.0 pg/mL    Comment: Performed at [place] [address], [place],  [address]   VAS [place] LOWER EXTREMITY VENOUS (DVT)  Result Date: 03/20/2022  Lower Venous DVT Study Patient Name:  Edward [name]  Date of Exam:   03/20/2022 Medical Rec #: [id number]        Accession #:    [id number] Date of Birth: 08-11-1940        Patient Gender: M Patient Age:   54 years Exam Location:  [facility] Procedure:      VAS [place] LOWER EXTREMITY VENOUS (DVT) Referring Phys: ABIGAIL HARRIS --------------------------------------------------------------------------------  Indications: Left leg swelling, warmth, redness.  Risk Factors: Remote history of left femoral-popliteal bypass. Limitations: Poor ultrasound/tissue interface and patient pain. Comparison Study: 08-02-2017 Prior lower extremity venous study was negative for                   DVT. Performing Technologist: Darlin Coco RDMS, RVT  Examination Guidelines: A complete evaluation includes B-mode imaging, spectral Doppler, color Doppler, and power Doppler as needed of all accessible portions of each vessel. Bilateral testing is considered an integral part of a complete examination. Limited examinations for reoccurring indications may be performed as noted. The reflux portion of the exam is performed with the patient in reverse Trendelenburg.   +-----+---------------+---------+-----------+----------+--------------+ RIGHTCompressibilityPhasicitySpontaneityPropertiesThrombus Aging +-----+---------------+---------+-----------+----------+--------------+ CFV  Full           Yes      Yes                                 +-----+---------------+---------+-----------+----------+--------------+   +---------+---------------+---------+-----------+----------+--------------+ LEFT     CompressibilityPhasicitySpontaneityPropertiesThrombus Aging +---------+---------------+---------+-----------+----------+--------------+ CFV      Full           Yes      Yes                                 +---------+---------------+---------+-----------+----------+--------------+ SFJ      Full                                                        +---------+---------------+---------+-----------+----------+--------------+ FV Prox  Full                                                        +---------+---------------+---------+-----------+----------+--------------+ FV Mid   Full                                                        +---------+---------------+---------+-----------+----------+--------------+  FV DistalFull                                                        +---------+---------------+---------+-----------+----------+--------------+ PFV      Full                                                        +---------+---------------+---------+-----------+----------+--------------+ POP      Full           Yes      Yes                                 +---------+---------------+---------+-----------+----------+--------------+ PTV      Full                                                        +---------+---------------+---------+-----------+----------+--------------+ PERO     Full                                                         +---------+---------------+---------+-----------+----------+--------------+    Summary: RIGHT: - No evidence of common femoral vein obstruction.  LEFT: - There is no evidence of deep vein thrombosis in the lower extremity.  - No cystic structure found in the popliteal fossa. - Ultrasound characteristics of enlarged lymph nodes noted in the groin.  *See table(s) above for measurements and observations.    Preliminary    DG Chest Port 1 View  Result Date: 03/20/2022 CLINICAL DATA:  Distention, history CHF EXAM: PORTABLE CHEST 1 VIEW COMPARISON:  05/06/2021 FINDINGS: Cardiac silhouette grossly enlarged. Pulmonary vascular congestion without focal consolidation. Left-sided pacer. Calcified aorta. Median sternotomy wires. Left atrial clip. IMPRESSION: Enlarged cardiac silhouette.  Pulmonary vascular congestion. Electronically Signed   By: Sammie Bench M.D.   On: 03/20/2022 11:30    Pending Labs Unresulted Labs (From admission, onward)     Start     Ordered   03/20/22 1112  Blood Cultures x 2 sites  BLOOD CULTURE X 2,   STAT      03/20/22 1114            Vitals/Pain Today's Vitals   03/20/22 1415 03/20/22 1430 03/20/22 1434 03/20/22 1513  BP: 92/64 106/69    Pulse: 64 63    Resp: 12 13    Temp:   (!) 97.4 F (36.3 C)   TempSrc:   Temporal   SpO2: 97% 98%    Weight:      Height:      PainSc:    3     Isolation Precautions Airborne and Contact precautions  Medications Medications  vancomycin (VANCOCIN) IVPB 1000 mg/200 mL premix (1,000 mg Intravenous New Bag/Given 03/20/22 1447)  fentaNYL (SUBLIMAZE) injection 50 mcg (50 mcg Intravenous Given 03/20/22  1434)  ondansetron (ZOFRAN) injection 4 mg (4 mg Intravenous Given 03/20/22 1427)  furosemide (LASIX) injection 20 mg (20 mg Intravenous Given 03/20/22 1428)    Mobility walks with person assist     Focused Assessments Renal Assessment Handoff:  Hemodialysis Schedule: Pt is not on dialysis. Pt is fluid over loaded. Last  Hemodialysis date and time: N/A   Restricted appendage:  N/A   R Recommendations: See Admitting Provider Note  Report given to:   Additional Notes:  Pt ambulatory with assist, A&Ox4. On room air. Wife would like to be called for updates when possible.

## 2022-03-20 NOTE — H&P (Signed)
Hospital Admission History and Physical Service Pager: [phone]  Patient name: Edward [name] Medical record number: [id number] Date of Birth: February 03, 1940 Age: 82 y.o. Gender: male  Primary Care Provider: [facility] Consultants: Nephrology Code Status: Full code   Preferred Emergency Contact: Brecker Feuerborn (wife) [phone]   Chief Complaint: Swelling and rash  Assessment and Plan: THUAN SHELDRICK is a 82 y.o. male presenting with gross edema and left lower extremity rash. Differential for this lower extremity edema includes heart failure exacerbation, cirrhosis, DVT, cellulitis.  Patient likely has heart failure exacerbation in the setting of left leg cellulitis.  Cirrhosis possible contributing factor given patient's history.  Exam and history consistent with cellulitis.  Bilateral swelling, abdominal edema and dyspnea on exertion consistent with volume overload due to heart failure.  Chest x-ray showing vascular congestion also supporting.  Will rule out septic arthritis.  DVT less likely with negative ultrasound.  Pertinent PMHx includes CAD, s/p CABG, a-fib on eliquis, CKD4, COPD, CHF. Hx of tubular adenoma of the colon, PAD, and diabetes.   * Acute exacerbation of CHF (congestive heart failure) (HCC) Stable respiratory status, obvious volume overload on exam.  Chest x-ray consistent with mild pulmonary edema.  Diuresis difficult with declining renal function.  Discussed with nephrology, okay to diurese at double home Lasix dose.  -Admit to FPTS, med-tele, Attending Dr. Gwendlyn Deutscher -Supplement O2 as needed, wean as tolerated -SPO2 saturation goals, 88-90 -F/u ECHO -Strict Is/Os -Daily Weight -Start IV Lasix '80mg'$  -PT/OT Eval -Daily Lab: CMP, CBC -Cardiorespiratory Monitoring     Cellulitis Extensive cellulitic rash over left lower extremity that does involve the knee joint. Given lack of fevers, and that patient is weight bearing, septic arthritis is unlikely, but  will evaluate for need for Ortho consult and joint aspiration. -Continue Vancomycin -If patient clinically worsens, add ceftriaxone -ESR, CRP -Left knee XR -Monitor fever -Follow-up blood cultures -Consider MRI  Acute-on-chronic kidney injury ([place]) Creatinine increased to 5.04 from baseline of around 3, this is likely due to renal injury due to volume overload, low concern for obstruction given patient history.  Given severity of renal function decrease consulted nephrology in setting of necessary diuresis.  Expect improvement in creatinine with diuresis as this is likely cardiorenal. -Nephrology consulted, recs appreciated -IV diuresis as below monitor kidney function -AM CMP -Hold home Valsartan  Diabetes mellitus ([place]) Last A1c 6.2.  Home insulin 48 units Lantus daily, Semaglutide. Will adjust insulin through admission as needed.  -Very sensitive sliding scale -CBGs with meals and at night  HTN (hypertension) Blood pressure soft in the ED.  Hold home Imdur.  Hold home valsartan. -Continue spironolactone -Continue Coreg   Chronic Stable Conditions Paroxysmal Atrial Fibrillation -Continue home Coreg Cirrhosis -Continue home Spironolactone -PT-INR f/u  FEN/GI: Carb modified, renal VTE Prophylaxis: Eliquis  Disposition: Med-Tele  History of Present Illness:  Edward [name] is a 82 y.o. male presenting with rash and lower extremity swelling.  Noted to have redness of his legs that started 2-3 days ago, since it started it has gotten worse. Denies any pain. Reports associated swelling that is more than normal. He has not tried anything for it including lotions or antibiotics. He did not think much of it initially until his wife looked at it. Denies fever or chills. Endorses dyspnea with exertion that has been worsening from normal over the past few weeks. Denies chest pain. Denies nausea or vomiting. Denies issues with urination. He feels that typically he does not  urinate as  much as he should. He has gained about 20 pounds over the past  5 weeks. This is the first time he has noticed his legs like this. He takes meds as prescribed but unsure of exact medications. History of bladder cancer, he is currently undergoing treatment for this and has had 6 treatments thus far. He has had a treatment once a month for that last 2 years.   In the ED, vital signs were stable, labs significant for creatinine 5.04, WBC 14.5, Hgb 9.7, BUN 73, platelets 104, BMP 131.2.  Chest x-ray showed vascular congestion.  Patient was given 20 mg IV Lasix and started on vancomycin.  Review Of Systems: Per HPI.   Pertinent Past Medical History: CHF Proximal A-fib PAD Type 2 diabetes CKD stage III  Remainder reviewed in history tab.   Pertinent Past Surgical History: CAD s/p CABG (stent placed most recently last year)   Remainder reviewed in history tab.  Pertinent Social History: Tobacco use: Former smoker, quit about 45 years ago. Smoked for about 40 years, 3-4 packs a day Alcohol use: occasionally  Other Substance use: no Lives with wife  Pertinent Family History: Reviewed.  Remainder reviewed in history tab.   Important Outpatient Medications: Apixaban Atorvastatin Carvedilol Furosemide Insulin Isosorbide mononitrate Levothyroxine Pantoprazole Semaglutide  Testosterone Valsartan  Remainder reviewed in medication history.   Objective: BP 106/69   Pulse 63   Temp (!) 97.4 F (36.3 C) (Temporal)   Resp 13   Ht '5\' 10"'$  (1.778 m)   Wt 108 kg   SpO2 98%   BMI 34.16 kg/m  Exam: General: A&O, NAD, lying comfortably in hospital bed HEENT: No sign of trauma, EOM grossly intact, moist mucous membranes Cardiac: RRR, no m/r/g Respiratory: CTAB, normal WOB, no w/c/r GI: Soft, NTTP, non-distended, no rebound or guarding Extremities: NTTP, no peripheral edema. Neuro: Moves all four extremities appropriately. Psych: Appropriate mood and affect   Labs:  CBC BMET   Recent Labs  Lab 03/20/22 1137  WBC 14.5*  HGB 9.7*  HCT 33.4*  PLT 104*   Recent Labs  Lab 03/20/22 1137  NA 135  K 4.3  CL 102  CO2 22  BUN 73*  CREATININE 5.04*  GLUCOSE 127*  CALCIUM 8.6*    Pertinent additional labs: BNP - 131.2  EKG: My own interpretation: atrial fibrillation, wide QRS   Imaging Studies Performed:  CXR Enlarged cardiac silhouette. Pulmonary vascular congestion.  My Interpretation: Mild pulmonary edema  DVT US RIGHT:  - No evidence of common femoral vein obstruction.    LEFT:  - There is no evidence of deep vein thrombosis in the lower extremity.    - No cystic structure found in the popliteal fossa.  - Ultrasound characteristics of enlarged lymph nodes noted in the groin.   Salvadore Oxford, MD 03/20/2022, 5:31 PM PGY-1, [place] Intern pager: [phone], text pages welcome Secure chat group [place]

## 2022-03-20 NOTE — Progress Notes (Signed)
FMTS Brief Progress Note  S:***   O: BP 119/70 (BP Location: Right Arm)   Pulse 62   Temp (!) 97.4 F (36.3 C) (Oral)   Resp 18   Ht '5\' 10"'$  (1.778 m)   Wt 108 kg   SpO2 98%   BMI 34.16 kg/m     A/P: Left LL swelling Knee xray with diffuse subcutaneous edema. ESR, CRP elevated. DVT U/S negative.  -on vanc and ceftriaxone  -f/u blood cultures  Acute diastolic CHF exacerbation S/p IV lasix '100mg'$  today. Risk stratification labs TSH elevated (6.5).  -f/u A1c, FLP, HIV, RPR  -monitor I&Os -redose lasix if new O2 requirement or SOB  -nephro recs   - Orders reviewed. Labs for AM ordered, which was adjusted as needed.  - If condition changes, plan includes redosing lasix.   Rolanda Lundborg, MD 03/20/2022, 7:28 PM PGY-1, [facility] Family Medicine Night Resident  Please page [phone] with questions.

## 2022-03-20 NOTE — Assessment & Plan Note (Addendum)
Stable. Holding home Imdur and Valsartan.   -Continue Coreg -Continue Midodrine '5mg'$  TID

## 2022-03-20 NOTE — ED Notes (Signed)
Pt attempted to call wife to update her that pt has bed. Pts wife did not answer.

## 2022-03-20 NOTE — Assessment & Plan Note (Addendum)
Respiratory status improved. Diuresis with IV lasix, had 2.6L UOP yesterday and weight is stable. -Strict Is/Os -Daily Weight -IV Lasix '80mg'$  BID -Cardiorespiratory Monitoring

## 2022-03-20 NOTE — Assessment & Plan Note (Addendum)
Improving on IV abx. Has remained afebrile, wll switch to PO Doxycycline from IV vanc  -Switch IV vanc to po doxcy  -Monitor fever -Consider MRI

## 2022-03-20 NOTE — ED Provider Notes (Signed)
[facility] Provider Note   CSN: [id number] Arrival date & time: 03/20/22  1012     History {Add pertinent medical, surgical, social history, OB history to HPI:1} Chief Complaint  Patient presents with  . Cellulitis    Edward [name] is a 82 y.o. male hx of CAD, s/p abg, a-fib on eliquis, ckd, copd, chf. Hx of tubular adenoma of the colon, PAD, and diabetes. He was sent in from [place] for evaluation of LLE swelling and redness. He has had 3 days of worsening swelling of the left lower extremity.  He had chronic bilateral lower extremity edema.  He takes Lasix.  He denies any significant pain fever or chills.  He has chronic shortness of breath which does not seem worse to him.  He denies chest pain syncope dizziness or lightheadedness. He is compliant with his eliquis.  He has had a 20 pound weight gain in the last month.  He states that he feels like his Lasix has not been giving him as much urine output.  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  His wife who supports the history states that 2 nights ago he developed shaking chills and then Sunday, the day after developed redness on the left upper thigh which is now spread throughout the rest of his left leg.  He has increasing exertional dyspnea over the last several days.  HPI     Home Medications Prior to Admission medications   Medication Sig Start Date End Date Taking? Authorizing Provider  acetaminophen (TYLENOL) 650 MG CR tablet Take 650 mg by mouth every 8 (eight) hours as needed for pain.    [provider]  albuterol (PROVENTIL HFA;VENTOLIN HFA) 108 (90 Base) MCG/ACT inhaler USE 2 PUFFS EVERY 4 HOURS AS NEEDED FOR WHEEZING Patient taking differently: Inhale 2 puffs into the lungs every 6 (six) hours as needed for shortness of breath. 11/11/17   Nahser, Wonda Cheng, MD  albuterol (PROVENTIL) (2.5 MG/3ML) 0.083% nebulizer solution Take  2.5 mg by nebulization every 6 (six) hours as needed for wheezing or shortness of breath.    [provider]  allopurinol (ZYLOPRIM) 300 MG tablet Take 150 mg by mouth daily.    [provider]  amoxicillin-clavulanate (AUGMENTIN) 500-125 MG tablet Take 1 tablet (500 mg total) by mouth in the morning and at bedtime. 09/25/21   Montine Circle, PA-C  apixaban (ELIQUIS) 2.5 MG TABS tablet Take 2.5 mg by mouth 2 (two) times daily.    [provider]  blood glucose meter kit and supplies Dispense based on patient and insurance preference. Use up to four times daily as directed. (FOR ICD-10 E10.9, E11.9). 02/18/17   Scot Jun, NP  carvedilol (COREG) 6.25 MG tablet Take 1 tablet (6.25 mg total) by mouth 2 (two) times daily. 02/24/19   Nahser, Wonda Cheng, MD  cetirizine (ZYRTEC) 10 MG tablet Take 10 mg by mouth daily.    [provider]  cholecalciferol (VITAMIN D) 1000 units tablet Take 1,000 Units by mouth daily.    [provider]  Cyanocobalamin 500 MCG TBDP Take 1 tablet by mouth daily.    [provider]  ferrous sulfate 325 (65 FE) MG tablet Take 650 mg by mouth every Monday, Wednesday, and Friday.    [provider]  furosemide (LASIX) 40 MG tablet TAKE 2 TABLETS BY MOUTH EVERY MORNING AND TAKE 1 TABLET IN THE AFTERNOON Patient  taking differently: Take 80 mg by mouth 2 (two) times daily. 03/18/18   Nahser, Wonda Cheng, MD  gabapentin (NEURONTIN) 100 MG capsule Take 100-300 mg by mouth 2 (two) times daily. 100 mg in the morning & 300 mg at bedtime    [provider]  insulin glargine (LANTUS) 100 UNIT/ML injection Inject 48 Units into the skin daily.    [provider]  isosorbide mononitrate (IMDUR) 30 MG 24 hr tablet Take 30 mg by mouth daily.    [provider]  Multiple Vitamin (MULTIVITAMIN) capsule Take 1 capsule by mouth daily.    [provider]  pantoprazole (PROTONIX) 40 MG tablet Take 40 mg by  mouth 2 (two) times daily.    [provider]  potassium chloride (KLOR-CON M) 10 MEQ tablet Take 10 mEq by mouth daily.    [provider]  Semaglutide, 1 MG/DOSE, 2 MG/1.5ML SOPN Inject 1 mg into the skin every Sunday.    [provider]  sildenafil (VIAGRA) 50 MG tablet May take 1-2 tablets as needed for sexual intercourse Patient taking differently: Take 50-100 mg by mouth as needed for erectile dysfunction. May take 1-2 tablets as needed for sexual intercourse 07/25/18   Nahser, Wonda Cheng, MD  spironolactone (ALDACTONE) 25 MG tablet Take 1 tablet (25 mg total) by mouth daily. 05/10/21   Dwyane Dee, MD  testosterone cypionate (DEPOTESTOSTERONE CYPIONATE) 200 MG/ML injection Inject 200 mg into the muscle every 14 (fourteen) days.    [provider]  valsartan (DIOVAN) 80 MG tablet Take 80 mg by mouth daily.    [provider]  Zinc 50 MG TABS Take 50 mg by mouth daily.    [provider]      Allergies    Lipitor [atorvastatin calcium]    Review of Systems   Review of Systems  Physical Exam Updated Vital Signs BP 103/73 (BP Location: Right Arm)   Pulse 80   Temp (!) 97.3 F (36.3 C) (Temporal)   Resp 18   Ht '5\' 10"'$  (1.778 m)   Wt 108 kg   SpO2 100%   BMI 34.16 kg/m  Physical Exam Vitals and nursing note reviewed.  Constitutional:      General: He is not in acute distress.    Appearance: He is well-developed. He is not diaphoretic.  HENT:     Head: Normocephalic and atraumatic.  Eyes:     General: No scleral icterus.    Conjunctiva/sclera: Conjunctivae normal.  Cardiovascular:     Rate and Rhythm: Normal rate and regular rhythm.     Heart sounds: Normal heart sounds.  Pulmonary:     Effort: Tachypnea present.     Breath sounds: Decreased air movement present. Examination of the right-upper field reveals wheezing. Examination of the left-upper field reveals wheezing. Examination of the right-middle field reveals  rales. Examination of the left-middle field reveals rales. Examination of the right-lower field reveals rales. Examination of the left-lower field reveals rales. Wheezing and rales present.  Abdominal:     General: There is distension.     Palpations: Abdomen is soft.     Tenderness: There is no abdominal tenderness.     Comments: Tight abdominal distention  Musculoskeletal:     Cervical back: Normal range of motion and neck supple.     Right lower leg: Edema present.     Left lower leg: Edema present.     Comments: Bilateral 3+ pitting edema, DP pulse 1+ bilaterally.  Left lower  extremity is more swollen than right, chronic per patient.  There is warmth on the right side and left medial thigh tenderness.  Skin:    General: Skin is warm and dry.  Neurological:     Mental Status: He is alert.  Psychiatric:        Behavior: Behavior normal.    ED Results / Procedures / Treatments   Labs (all labs ordered are listed, but only abnormal results are displayed) Labs Reviewed - No data to display  EKG None  Radiology No results found.  Procedures Procedures  {Document cardiac monitor, telemetry assessment procedure when appropriate:1}  Medications Ordered in ED Medications - No data to display  ED Course/ Medical Decision Making/ A&P Clinical Course as of 03/20/22 1444  Tue Mar 20, 2022  1132 Resp panel by RT-PCR (RSV, Flu A&B, Covid) Anterior Nasal Swab [AH]    Clinical Course User Index [AH] Margarita Mail, PA-C   {   Click here for ABCD2, HEART and other calculatorsREFRESH Note before signing :1}                          Medical Decision Making Amount and/or Complexity of Data Reviewed Labs: ordered. Decision-making details documented in ED Course. Radiology: ordered.  Risk Prescription drug management.   ***  {Document critical care time when appropriate:1} {Document review of labs and clinical decision tools ie heart score, Chads2Vasc2 etc:1}  {Document your  independent review of radiology images, and any outside records:1} {Document your discussion with family members, caretakers, and with consultants:1} {Document social determinants of health affecting pt's care:1} {Document your decision making why or why not admission, treatments were needed:1} Final Clinical Impression(s) / ED Diagnoses Final diagnoses:  None    Rx / DC Orders ED Discharge Orders     None

## 2022-03-21 ENCOUNTER — Observation Stay (HOSPITAL_COMMUNITY): Payer: No Typology Code available for payment source

## 2022-03-21 ENCOUNTER — Inpatient Hospital Stay (HOSPITAL_COMMUNITY): Payer: No Typology Code available for payment source

## 2022-03-21 DIAGNOSIS — J449 Chronic obstructive pulmonary disease, unspecified: Secondary | ICD-10-CM | POA: Diagnosis present

## 2022-03-21 DIAGNOSIS — N179 Acute kidney failure, unspecified: Secondary | ICD-10-CM | POA: Diagnosis present

## 2022-03-21 DIAGNOSIS — C679 Malignant neoplasm of bladder, unspecified: Secondary | ICD-10-CM | POA: Diagnosis present

## 2022-03-21 DIAGNOSIS — E1151 Type 2 diabetes mellitus with diabetic peripheral angiopathy without gangrene: Secondary | ICD-10-CM | POA: Diagnosis present

## 2022-03-21 DIAGNOSIS — E8770 Fluid overload, unspecified: Secondary | ICD-10-CM | POA: Diagnosis not present

## 2022-03-21 DIAGNOSIS — E8809 Other disorders of plasma-protein metabolism, not elsewhere classified: Secondary | ICD-10-CM | POA: Diagnosis present

## 2022-03-21 DIAGNOSIS — N184 Chronic kidney disease, stage 4 (severe): Secondary | ICD-10-CM | POA: Diagnosis present

## 2022-03-21 DIAGNOSIS — L03116 Cellulitis of left lower limb: Secondary | ICD-10-CM | POA: Diagnosis present

## 2022-03-21 DIAGNOSIS — E1122 Type 2 diabetes mellitus with diabetic chronic kidney disease: Secondary | ICD-10-CM | POA: Diagnosis present

## 2022-03-21 DIAGNOSIS — Z794 Long term (current) use of insulin: Secondary | ICD-10-CM | POA: Diagnosis not present

## 2022-03-21 DIAGNOSIS — D696 Thrombocytopenia, unspecified: Secondary | ICD-10-CM | POA: Diagnosis present

## 2022-03-21 DIAGNOSIS — K766 Portal hypertension: Secondary | ICD-10-CM | POA: Diagnosis present

## 2022-03-21 DIAGNOSIS — K760 Fatty (change of) liver, not elsewhere classified: Secondary | ICD-10-CM | POA: Diagnosis present

## 2022-03-21 DIAGNOSIS — E871 Hypo-osmolality and hyponatremia: Secondary | ICD-10-CM | POA: Diagnosis present

## 2022-03-21 DIAGNOSIS — I2510 Atherosclerotic heart disease of native coronary artery without angina pectoris: Secondary | ICD-10-CM | POA: Diagnosis present

## 2022-03-21 DIAGNOSIS — K7460 Unspecified cirrhosis of liver: Secondary | ICD-10-CM | POA: Diagnosis present

## 2022-03-21 DIAGNOSIS — E785 Hyperlipidemia, unspecified: Secondary | ICD-10-CM | POA: Diagnosis present

## 2022-03-21 DIAGNOSIS — I130 Hypertensive heart and chronic kidney disease with heart failure and stage 1 through stage 4 chronic kidney disease, or unspecified chronic kidney disease: Secondary | ICD-10-CM | POA: Diagnosis present

## 2022-03-21 DIAGNOSIS — I480 Paroxysmal atrial fibrillation: Secondary | ICD-10-CM | POA: Diagnosis present

## 2022-03-21 DIAGNOSIS — I5030 Unspecified diastolic (congestive) heart failure: Secondary | ICD-10-CM

## 2022-03-21 DIAGNOSIS — Z1152 Encounter for screening for COVID-19: Secondary | ICD-10-CM | POA: Diagnosis not present

## 2022-03-21 DIAGNOSIS — I959 Hypotension, unspecified: Secondary | ICD-10-CM | POA: Diagnosis not present

## 2022-03-21 DIAGNOSIS — D631 Anemia in chronic kidney disease: Secondary | ICD-10-CM | POA: Diagnosis present

## 2022-03-21 DIAGNOSIS — I509 Heart failure, unspecified: Secondary | ICD-10-CM | POA: Diagnosis not present

## 2022-03-21 DIAGNOSIS — E1140 Type 2 diabetes mellitus with diabetic neuropathy, unspecified: Secondary | ICD-10-CM | POA: Diagnosis present

## 2022-03-21 DIAGNOSIS — R0609 Other forms of dyspnea: Secondary | ICD-10-CM

## 2022-03-21 DIAGNOSIS — I5033 Acute on chronic diastolic (congestive) heart failure: Secondary | ICD-10-CM | POA: Diagnosis present

## 2022-03-21 DIAGNOSIS — E1165 Type 2 diabetes mellitus with hyperglycemia: Secondary | ICD-10-CM | POA: Diagnosis present

## 2022-03-21 LAB — COMPREHENSIVE METABOLIC PANEL
ALT: 12 U/L (ref 0–44)
AST: 23 U/L (ref 15–41)
Albumin: 2.9 g/dL — ABNORMAL LOW (ref 3.5–5.0)
Alkaline Phosphatase: 102 U/L (ref 38–126)
Anion gap: 13 (ref 5–15)
BUN: 82 mg/dL — ABNORMAL HIGH (ref 8–23)
CO2: 21 mmol/L — ABNORMAL LOW (ref 22–32)
Calcium: 8.4 mg/dL — ABNORMAL LOW (ref 8.9–10.3)
Chloride: 100 mmol/L (ref 98–111)
Creatinine, Ser: 5.11 mg/dL — ABNORMAL HIGH (ref 0.61–1.24)
GFR, Estimated: 11 mL/min — ABNORMAL LOW (ref >60)
Glucose, Bld: 132 mg/dL — ABNORMAL HIGH (ref 70–99)
Potassium: 4.6 mmol/L (ref 3.5–5.1)
Sodium: 134 mmol/L — ABNORMAL LOW (ref 135–145)
Total Bilirubin: 1.6 mg/dL — ABNORMAL HIGH (ref 0.3–1.2)
Total Protein: 6.7 g/dL (ref 6.5–8.1)

## 2022-03-21 LAB — CBC
HCT: 31.5 % — ABNORMAL LOW (ref 39.0–52.0)
Hemoglobin: 9.1 g/dL — ABNORMAL LOW (ref 13.0–17.0)
MCH: 25.5 pg — ABNORMAL LOW (ref 26.0–34.0)
MCHC: 28.9 g/dL — ABNORMAL LOW (ref 30.0–36.0)
MCV: 88.2 fL (ref 80.0–100.0)
Platelets: 106 10*3/uL — ABNORMAL LOW (ref 150–400)
RBC: 3.57 MIL/uL — ABNORMAL LOW (ref 4.22–5.81)
RDW: 18.2 % — ABNORMAL HIGH (ref 11.5–15.5)
WBC: 10.4 10*3/uL (ref 4.0–10.5)
nRBC: 0 % (ref 0.0–0.2)

## 2022-03-21 LAB — GLUCOSE, CAPILLARY
Glucose-Capillary: 116 mg/dL — ABNORMAL HIGH (ref 70–99)
Glucose-Capillary: 162 mg/dL — ABNORMAL HIGH (ref 70–99)
Glucose-Capillary: 135 mg/dL — ABNORMAL HIGH (ref 70–99)
Glucose-Capillary: 218 mg/dL — ABNORMAL HIGH (ref 70–99)

## 2022-03-21 LAB — ECHOCARDIOGRAM COMPLETE
AR max vel: 1.94 cm2
AV Area VTI: 1.98 cm2
AV Area mean vel: 1.84 cm2
AV Mean grad: 6 mmHg
AV Peak grad: 11.6 mmHg
Ao pk vel: 1.7 m/s
Area-P 1/2: 4.22 cm2
Calc EF: 50 %
Height: 70 in
MV M vel: 2.05 m/s
MV Peak grad: 16.8 mmHg
S' Lateral: 2.6 cm
Single Plane A2C EF: 48.1 %
Single Plane A4C EF: 54.9 %
Weight: 3890.68 oz

## 2022-03-21 LAB — PROTIME-INR
INR: 1.9 — ABNORMAL HIGH (ref 0.8–1.2)
Prothrombin Time: 21.9 seconds — ABNORMAL HIGH (ref 11.4–15.2)

## 2022-03-21 LAB — LIPID PANEL
Cholesterol: 88 mg/dL (ref 0–200)
HDL: 26 mg/dL — ABNORMAL LOW (ref >40)
LDL Cholesterol: 48 mg/dL (ref 0–99)
Total CHOL/HDL Ratio: 3.4 RATIO
Triglycerides: 68 mg/dL (ref <150)
VLDL: 14 mg/dL (ref 0–40)

## 2022-03-21 LAB — HEMOGLOBIN A1C
Hgb A1c MFr Bld: 6.1 % — ABNORMAL HIGH (ref 4.8–5.6)
Mean Plasma Glucose: 128 mg/dL

## 2022-03-21 LAB — MAGNESIUM: Magnesium: 2.2 mg/dL (ref 1.7–2.4)

## 2022-03-21 LAB — RPR: RPR Ser Ql: NONREACTIVE

## 2022-03-21 MED ORDER — MIDODRINE HCL 5 MG PO TABS
5.0000 mg | ORAL_TABLET | Freq: Three times a day (TID) | ORAL | Status: DC
  Administered 2022-03-21 – 2022-03-25 (×13): 5 mg via ORAL
  Filled 2022-03-21 (×13): qty 1

## 2022-03-21 MED ORDER — VANCOMYCIN HCL IN DEXTROSE 1-5 GM/200ML-% IV SOLN
1000.0000 mg | Freq: Once | INTRAVENOUS | Status: AC
  Administered 2022-03-21: 1000 mg via INTRAVENOUS
  Filled 2022-03-21: qty 200

## 2022-03-21 MED ORDER — VANCOMYCIN VARIABLE DOSE PER UNSTABLE RENAL FUNCTION (PHARMACIST DOSING): Status: DC

## 2022-03-21 MED ORDER — POLYVINYL ALCOHOL 1.4 % OP SOLN
1.0000 [drp] | OPHTHALMIC | Status: DC | PRN
  Administered 2022-03-21 – 2022-03-22 (×2): 1 [drp] via OPHTHALMIC
  Filled 2022-03-21: qty 15

## 2022-03-21 MED ORDER — FUROSEMIDE 10 MG/ML IJ SOLN
80.0000 mg | Freq: Two times a day (BID) | INTRAMUSCULAR | Status: DC
  Administered 2022-03-21 – 2022-03-24 (×8): 80 mg via INTRAVENOUS
  Filled 2022-03-21 (×8): qty 8

## 2022-03-21 MED ORDER — SODIUM CHLORIDE 0.9 % IV SOLN
2.0000 g | Freq: Every day | INTRAVENOUS | Status: DC
  Administered 2022-03-21 – 2022-03-23 (×3): 2 g via INTRAVENOUS
  Filled 2022-03-21 (×3): qty 20

## 2022-03-21 NOTE — Evaluation (Signed)
Occupational Therapy Evaluation  Patient Details Name: Edward [name] MRN: [id number] DOB: Jun 25, 1940 Today's Date: 03/21/2022   History of Present Illness Pt is a 82 yo male admitted with swelling in LLE with redness and pain. Neg for DVT PMH:CKD, CVA, CAD, DM, HTN, PAF, LTKA   Clinical Impression   Pt admitted with the above diagnosis and has the deficits outlined below. Pt would benefit from cont OT to increase ability to complete LE adls and all adl transfers safely and independently. Pt lives with wife who is around most of the time. Pt is most limited in area of safety awareness and ability to access the LLE due to swelling. Do not feel pt will need post acute OT at this time. Will focus therapy on LE adls and safety with adl mobility.      Recommendations for follow up therapy are one component of a multi-disciplinary discharge planning process, led by the attending physician.  Recommendations may be updated based on patient status, additional functional criteria and insurance authorization.   Follow Up Recommendations  No OT follow up     Assistance Recommended at Discharge PRN  Patient can return home with the following A little help with bathing/dressing/bathroom;Assistance with cooking/housework    Functional Status Assessment  Patient has had a recent decline in their functional status and demonstrates the ability to make significant improvements in function in a reasonable and predictable amount of time.  Equipment Recommendations  None recommended by OT    Recommendations for Other Services       Precautions / Restrictions Restrictions Weight Bearing Restrictions: No      Mobility Bed Mobility Overal bed mobility: Modified Independent             General bed mobility comments: pt used bedrails    Transfers Overall transfer level: Needs assistance Equipment used: Rolling walker (2 wheels) Transfers: Sit to/from Stand, Bed to chair/wheelchair/BSC Sit to  Stand: Supervision (cues for hand placement)     Step pivot transfers: Supervision     General transfer comment: Pt instructed to push off of bed but refused and chose to push off walker.      Balance Overall balance assessment: Needs assistance Sitting-balance support: Feet supported Sitting balance-Leahy Scale: Good     Standing balance support: Bilateral upper extremity supported, Reliant on assistive device for balance Standing balance-Leahy Scale: Fair Standing balance comment: needs walker for ambulation                           ADL either performed or assessed with clinical judgement   ADL Overall ADL's : Needs assistance/impaired Eating/Feeding: Independent;Sitting   Grooming: Wash/dry hands;Supervision/safety;Standing Grooming Details (indicate cue type and reason): Pt stood at sink for 2 min Upper Body Bathing: Set up;Sitting   Lower Body Bathing: Minimal assistance;Sit to/from stand;Cueing for compensatory techniques   Upper Body Dressing : Set up;Sitting   Lower Body Dressing: Minimal assistance;Sit to/from stand;Cueing for compensatory techniques   Toilet Transfer: Supervision/safety;Ambulation;Rolling walker (2 wheels);Grab bars Toilet Transfer Details (indicate cue type and reason): Pt stood at toilet; used urinal Toileting- Water quality scientist and Hygiene: Supervision/safety;Sit to/from stand       Functional mobility during ADLs: Supervision/safety;Rolling walker (2 wheels) General ADL Comments: Pt very unsafe with decisions. Pt refused to donn socks to walk and was adament about not wearing socks despite safey concerns of slipping.  Pt instructed to call to get back into bed but aware that  pt will do whatever he wants to despite instructions from staff.  Pt is not cognitively impaired; feel this is his personality and wife agrees.     Vision Baseline Vision/History: 0 No visual deficits Ability to See in Adequate Light: 0  Adequate Patient Visual Report: No change from baseline Vision Assessment?: No apparent visual deficits     Perception Perception Perception Tested?: No   Praxis Praxis Praxis tested?: Within functional limits    Pertinent Vitals/Pain Pain Assessment Pain Assessment: Faces Faces Pain Scale: Hurts little more Pain Location: LLE Pain Descriptors / Indicators: Tightness, Sore Pain Intervention(s): Limited activity within patient's tolerance, Monitored during session, Repositioned     Hand Dominance Right   Extremity/Trunk Assessment Upper Extremity Assessment Upper Extremity Assessment: Overall WFL for tasks assessed   Lower Extremity Assessment Lower Extremity Assessment: Defer to PT evaluation   Cervical / Trunk Assessment Cervical / Trunk Assessment: Normal   Communication Communication Communication: No difficulties   Cognition Arousal/Alertness: Awake/alert Behavior During Therapy: Agitated (personality) Overall Cognitive Status: Within Functional Limits for tasks assessed                                       General Comments  Pt most limited by decreased awareness of safety issues and pt likes to do what he wants to do. Wife states this is normal for him.    Exercises     Shoulder Instructions      Home Living Family/patient expects to be discharged to:: Private residence Living Arrangements: Spouse/significant other Available Help at Discharge: Family;Available 24 hours/day Type of Home: House Home Access: Stairs to enter [employer] of Steps: 5 Entrance Stairs-Rails: Can reach both;Left;Right Home Layout: Two level;Other (Comment) (lift chair to second floor)     Bathroom Shower/Tub: Walk-in shower;Curtain   Bathroom Toilet: Standard     Home Equipment: Conservation officer, nature (2 wheels);Wheelchair - manual;Grab bars - tub/shower          Prior Functioning/Environment Prior Level of Function : Independent/Modified  Independent             Mobility Comments: uses walker when needed ADLs Comments: independent        OT Problem List: Decreased safety awareness;Decreased knowledge of use of DME or AE;Pain;Increased edema;Other (comment) (LE swelling)      OT Treatment/Interventions: Self-care/ADL training;Balance training;Therapeutic activities;DME and/or AE instruction    OT Goals(Current goals can be found in the care plan section) Acute Rehab OT Goals Patient Stated Goal: none stated OT Goal Formulation: With patient/family Time For Goal Achievement: 04/04/22 Potential to Achieve Goals: Good ADL Goals Pt Will Perform Lower Body Dressing: with modified independence;sit to/from stand Pt Will Transfer to Toilet: with modified independence;ambulating Pt Will Perform Toileting - Clothing Manipulation and hygiene: with modified independence;sit to/from stand Additional ADL Goal #1: Pt will gather clothes with walker and dress self with mod I  OT Frequency: Min 2X/week    Co-evaluation              AM-PAC OT "6 Clicks" Daily Activity     Outcome Measure Help from another person eating meals?: None Help from another person taking care of personal grooming?: None Help from another person toileting, which includes using toliet, bedpan, or urinal?: None Help from another person bathing (including washing, rinsing, drying)?: A Little Help from another person to put on and taking off regular upper body clothing?: None  Help from another person to put on and taking off regular lower body clothing?: A Little 6 Click Score: 22   End of Session Equipment Utilized During Treatment: Rolling walker (2 wheels) Nurse Communication: Mobility status  Activity Tolerance: Patient tolerated treatment well Patient left: with call bell/phone within reach;in chair;with family/visitor present  OT Visit Diagnosis: Unsteadiness on feet (R26.81)                Time: [id number] OT Time Calculation (min): 17  min Charges:  OT General Charges $OT Visit: 1 Visit OT Evaluation $OT Eval Low Complexity: 1 Low  Glenford Peers 03/21/2022, 10:45 AM

## 2022-03-21 NOTE — Evaluation (Signed)
Physical Therapy Evaluation Patient Details Name: Edward [name] MRN: [id number] DOB: 1940-03-31 Today's Date: 03/21/2022  History of Present Illness  Pt is a 82 yo male admitted with swelling in LLE with redness and pain. Neg for DVT PMH:CKD, CVA, CAD, DM, HTN, PAF, LTKA  Clinical Impression  Pt presents with admitting diagnosis above. Pt tolerated treatment well today and was able to ambulate in hallway with RW min guard. Pt moves fairly well however can be impulsive and agitated at times. Wife states that this is his personality. Anticipate that pt will only need 1 additional visit to navigate stairs then likely DCPT. Also do not feel that pt will have any post acute PT needs and pt has needed  DME at home.     Recommendations for follow up therapy are one component of a multi-disciplinary discharge planning process, led by the attending physician.  Recommendations may be updated based on patient status, additional functional criteria and insurance authorization.  Follow Up Recommendations No PT follow up      Assistance Recommended at Discharge Set up Supervision/Assistance  Patient can return home with the following  A little help with walking and/or transfers;A little help with bathing/dressing/bathroom;Assistance with cooking/housework;Assist for transportation;Help with stairs or ramp for entrance    Equipment Recommendations Other (comment) (Pt has needed DME)  Recommendations for Other Services       Functional Status Assessment Patient has had a recent decline in their functional status and demonstrates the ability to make significant improvements in function in a reasonable and predictable amount of time.     Precautions / Restrictions Precautions Precautions: Fall Restrictions Weight Bearing Restrictions: No      Mobility  Bed Mobility Overal bed mobility: Modified Independent                  Transfers Overall transfer level: Needs assistance Equipment  used: Rolling walker (2 wheels) Transfers: Sit to/from Stand Sit to Stand: Supervision (cues for hand placement)                Ambulation/Gait Ambulation/Gait assistance: Min guard Gait Distance (Feet): 150 Feet Assistive device: Rolling walker (2 wheels) Gait Pattern/deviations: Step-through pattern, Decreased stance time - left, Decreased stride length Gait velocity: decreased     General Gait Details: No LOB noted.  Stairs            Wheelchair Mobility    Modified Rankin (Stroke Patients Only)       Balance Overall balance assessment: Needs assistance Sitting-balance support: Feet supported Sitting balance-Leahy Scale: Good     Standing balance support: Bilateral upper extremity supported, Reliant on assistive device for balance Standing balance-Leahy Scale: Fair Standing balance comment: needs walker for ambulation                             Pertinent Vitals/Pain Pain Assessment Pain Assessment: Faces Faces Pain Scale: Hurts little more Pain Location: LLE Pain Descriptors / Indicators: Tightness, Sore Pain Intervention(s): Monitored during session, RN gave pain meds during session    Home Living Family/patient expects to be discharged to:: Private residence Living Arrangements: Spouse/significant other Available Help at Discharge: Family;Available 24 hours/day Type of Home: House Home Access: Stairs to enter Entrance Stairs-Rails: Can reach both;Left;Right Entrance Stairs-Number of Steps: 5   Home Layout: Two level;Other (Comment) (lift chair to second floor) Home Equipment: Rolling Walker (2 wheels);Wheelchair - manual;Grab bars - tub/shower      Prior  Function Prior Level of Function : Independent/Modified Independent             Mobility Comments: uses walker when needed ADLs Comments: independent     Hand Dominance   Dominant Hand: Right    Extremity/Trunk Assessment   Upper Extremity Assessment Upper  Extremity Assessment: Overall WFL for tasks assessed    Lower Extremity Assessment Lower Extremity Assessment: Overall WFL for tasks assessed    Cervical / Trunk Assessment Cervical / Trunk Assessment: Normal  Communication   Communication: No difficulties  Cognition Arousal/Alertness: Awake/alert Behavior During Therapy: Agitated (personality) Overall Cognitive Status: Within Functional Limits for tasks assessed                                 General Comments: 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 According to wife this is his personality. Pt had an episode of SOB at end of session however VSS and pt was at 100% on RA.    Exercises     Assessment/Plan    PT Assessment Patient needs continued PT services  PT Problem List Decreased strength;Decreased activity tolerance;Decreased balance;Decreased mobility;Decreased knowledge of use of DME;Decreased safety awareness;Decreased knowledge of precautions;Cardiopulmonary status limiting activity;Decreased skin integrity       PT Treatment Interventions DME instruction;Gait training;Stair training;Functional mobility training;Therapeutic activities;Therapeutic exercise;Neuromuscular re-education;Patient/family education    PT Goals (Current goals can be found in the Care Plan section)  Acute Rehab PT Goals Patient Stated Goal: To get out of this place PT Goal Formulation: With patient/family Time For Goal Achievement: 04/04/22 Potential to Achieve Goals: Good    Frequency Min 3X/week (however likely DC after completing stairs)     Co-evaluation               AM-PAC PT "6 Clicks" Mobility  Outcome Measure Help needed turning from your back to your side while in a flat bed without using bedrails?: None Help needed moving from lying on your back to sitting on the side of a flat  bed without using bedrails?: None Help needed moving to and from a bed to a chair (including a wheelchair)?: A Little Help needed standing up from a chair using your arms (e.g., wheelchair or bedside chair)?: None Help needed to walk in hospital room?: A Little Help needed climbing 3-5 steps with a railing? : A Little 6 Click Score: 21    End of Session Equipment Utilized During Treatment: Gait belt Activity Tolerance: Patient tolerated treatment well;Patient limited by fatigue Patient left: in bed;with call bell/phone within reach;with family/visitor present Nurse Communication: Mobility status PT Visit Diagnosis: Other abnormalities of gait and mobility (R26.89)    Time: [id number] PT Time Calculation (min) (ACUTE ONLY): 31 min   Charges:   PT Evaluation $PT Eval Moderate Complexity: 1 Mod PT Treatments $Gait Training: 8-22 mins $Therapeutic Activity: 8-22 mins        Shelby Mattocks, PT, DPT Acute Rehab Services [id number]   Viann Shove 03/21/2022, 12:27 PM

## 2022-03-21 NOTE — TOC Progression Note (Signed)
Transition of Care [facility]) - Progression Note    Patient Details  Name: ZAIDON HIGA MRN: [id number] Date of Birth: 09-23-1940  Transition of Care [facility]) CM/SW Contact  Zenon Mayo, RN Phone Number: 03/21/2022, 11:16 AM  Clinical Narrative:    from home,indep, chf ex, ckd, cellulitis, conts on iv abx, iv lasix.   TOC following.         Expected Discharge Plan and Services                                               Social Determinants of Health (SDOH) Interventions SDOH Screenings   Depression (PHQ2-9): Low Risk  (10/28/2018)  Tobacco Use: Medium Risk (03/20/2022)    Readmission Risk Interventions    05/08/2021   12:11 PM  Readmission Risk Prevention Plan  Transportation Screening Complete  Medication Review ([place]) Complete  PCP or Specialist appointment within 3-5 days of discharge Complete  [place] or [place] Complete  SW Recovery Care/Counseling Consult Complete  Arp Not Applicable

## 2022-03-21 NOTE — Consult Note (Signed)
[facility] Resident Consultation Note  Requesting MD: Dr. Ruben Im Indication for Consultation: AKI on CKD with volume overload  HPI:  Edward [name] is a 82 y.o. male who is admitted for left lower extremity cellulitis, found to be volume overloaded.  He has AKI with creatinine proximately 5 up from a baseline of around 2.5.  He is status post Lasix 100 mg.  His blood pressures have been somewhat on the soft side.  History is notable for CKD stage IV, diastolic heart failure, and cirrhosis.  He has had 1 paracentesis in April 2023.  He reports significant lower extremity edema over the last couple of weeks.  He reports some abdominal distention.  He notes that his left leg is somewhat less tender than yesterday.  His wife notes that his weight is up approximately 20 pounds.  She thinks that he looks a little less ill today.  Urine output since hospitalization not well-documented, 2 occurrences noted last 24.  He follows with a nephrologist at the [place].  Creat  Date/Time Value Ref Range Status  02/06/2016 11:24 AM 1.76 (H) 0.70 - 1.18 mg/dL Final    Comment:      For patients > or = 80 years of age: The upper reference limit for Creatinine is approximately 13% higher for people identified as African-American.     12/07/2015 10:34 AM 1.56 (H) 0.70 - 1.18 mg/dL Final    Comment:      For patients > or = 80 years of age: The upper reference limit for Creatinine is approximately 13% higher for people identified as African-American.     08/29/2015 08:43 AM 1.52 (H) 0.70 - 1.18 mg/dL Final    Comment:      For patients > or = 80 years of age: The upper reference limit for Creatinine is approximately 13% higher for people identified as African-American.     09/20/2012 08:38 AM 1.37 (H) 0.50 - 1.35 mg/dL Final  01/28/2012 10:33 AM 1.12 0.50 - 1.35 mg/dL Final   Creatinine, Ser  Date/Time Value Ref Range Status  03/21/2022 01:08 AM 5.11 (H) 0.61 - 1.24 mg/dL Final   03/20/2022 11:37 AM 5.04 (H) 0.61 - 1.24 mg/dL Final  09/25/2021 04:28 PM 3.10 (H) 0.61 - 1.24 mg/dL Final  05/08/2021 04:26 AM 2.98 (H) 0.61 - 1.24 mg/dL Final  05/07/2021 01:34 AM 3.21 (H) 0.61 - 1.24 mg/dL Final  05/06/2021 07:27 AM 3.32 (H) 0.61 - 1.24 mg/dL Final  03/19/2021 01:35 AM 2.65 (H) 0.61 - 1.24 mg/dL Final  03/18/2021 01:37 AM 2.62 (H) 0.61 - 1.24 mg/dL Final  03/17/2021 06:07 AM 2.63 (H) 0.61 - 1.24 mg/dL Final  03/16/2021 12:56 PM 2.79 (H) 0.61 - 1.24 mg/dL Final  05/03/2018 11:28 AM 2.34 (H) 0.61 - 1.24 mg/dL Final  04/04/2018 11:00 AM 2.55 (H) 0.76 - 1.27 mg/dL Final  08/02/2017 04:49 PM 2.15 (H) 0.76 - 1.27 mg/dL Final  02/12/2017 07:04 PM 2.19 (H) 0.61 - 1.24 mg/dL Final  07/03/2016 01:46 PM 1.42 (H) 0.76 - 1.27 mg/dL Final  02/15/2016 10:27 AM 1.44 (H) 0.76 - 1.27 mg/dL Final  10/11/2014 10:45 AM 1.54 (H) 0.40 - 1.50 mg/dL Final  02/17/2013 07:20 AM 1.22 0.50 - 1.35 mg/dL Final  12/01/2012 08:15 AM 1.4 0.4 - 1.5 mg/dL Final  12/01/2012 08:15 AM 1.4 0.4 - 1.5 mg/dL Final  10/31/2012 11:54 AM 1.4 0.4 - 1.5 mg/dL Final  06/11/2012 10:10 AM 1.3 0.4 - 1.5 mg/dL Final  06/04/2012 09:44 AM 1.3 0.4 -  1.5 mg/dL Final  04/23/2012 09:58 AM 1.1 0.4 - 1.5 mg/dL Final  04/14/2012 12:02 PM 1.1 0.4 - 1.5 mg/dL Final  03/07/2012 05:40 AM 1.15 0.50 - 1.35 mg/dL Final  02/29/2012 09:37 AM 1.12 0.50 - 1.35 mg/dL Final  01/07/2012 07:43 AM 1.10 0.50 - 1.35 mg/dL Final  06/21/2011 05:30 AM 1.17 0.50 - 1.35 mg/dL Final  06/20/2011 03:33 PM 1.14 0.50 - 1.35 mg/dL Final  06/20/2011 05:25 AM 1.19 0.50 - 1.35 mg/dL Final  06/19/2011 06:15 AM 1.16 0.50 - 1.35 mg/dL Final  06/18/2011 04:35 AM 1.20 0.50 - 1.35 mg/dL Final  06/17/2011 05:25 AM 1.54 (H) 0.50 - 1.35 mg/dL Final  06/16/2011 04:52 AM 1.37 (H) 0.50 - 1.35 mg/dL Final  06/15/2011 05:39 PM 1.80 (H) 0.50 - 1.35 mg/dL Final  06/15/2011 05:00 PM 1.53 (H) 0.50 - 1.35 mg/dL Final  06/15/2011 04:06 AM 1.55 (H) 0.50 - 1.35 mg/dL Final   06/14/2011 10:38 PM 1.40 (H) 0.50 - 1.35 mg/dL Final  06/14/2011 10:32 PM 1.36 (H) 0.50 - 1.35 mg/dL Final  06/12/2011 09:22 AM 1.07 0.50 - 1.35 mg/dL Final  05/31/2011 02:41 PM 1.4 0.4 - 1.5 mg/dL Final  02/14/2011 10:25 AM 1.3 0.4 - 1.5 mg/dL Final  11/15/2010 11:20 AM 1.3 0.4 - 1.5 mg/dL Final  11/01/2010 11:54 AM 1.25 0.50 - 1.35 mg/dL Final  08/01/2010 04:50 AM 1.01 0.50 - 1.35 mg/dL Final  07/31/2010 07:13 AM 1.06 0.50 - 1.35 mg/dL Final  07/30/2010 06:37 AM 1.27 0.50 - 1.35 mg/dL Final    Comment:    **Please note change in reference range.**  07/29/2010 03:57 PM 1.46 (H) 0.50 - 1.35 mg/dL Final    Comment:    **Please note change in reference range.**  06/06/2010 10:26 AM 1.3 0.4 - 1.5 mg/dL Final  05/26/2010 02:40 PM 1.1 0.4 - 1.5 mg/dL Final  05/24/2010 09:01 AM 1.15 0.4 - 1.5 mg/dL Final     PMHx:   Past Medical History:  Diagnosis Date   Anemia    ?   Carotid artery occlusion    Chronic diastolic CHF (congestive heart failure) ([place]) 04/23/2012   CKD (chronic kidney disease), stage III (HCC)    Claudication (HCC)    Coronary artery disease    s/p CABG 2013   Diabetic neuropathy (HCC)    mostly feet/ legs   Diverticulosis    Dyslipidemia    Exertional shortness of breath    GERD (gastroesophageal reflux disease)    GI bleed    while on triple anticoag therapy   HTN (hypertension)    Pacemaker    PAF (paroxysmal atrial fibrillation) ([place])    s/p MAZE at time of CABG   PVD (peripheral vascular disease) ([place])    R CEA 2011, prior PTA, left fem-pop 2014   Stroke ([place])    vs TIA   Tubular adenoma of colon    Type II diabetes mellitus ([place])     Past Surgical History:  Procedure Laterality Date   ABDOMINAL AORTAGRAM N/A 01/07/2012   Procedure: ABDOMINAL Maxcine Ham;  Surgeon: Angelia Mould, MD;  Location: [facility] CATH LAB;  Service: Cardiovascular;  Laterality: N/A;   BALLOON DILATION N/A 02/17/2013   Procedure: BALLOON DILATION;  Surgeon: Jerene Bears,  MD;  Location: [facility] ENDOSCOPY;  Service: Gastroenterology;  Laterality: N/A;   BIOPSY  03/17/2021   Procedure: BIOPSY;  Surgeon: [name], MD;  Location: [facility] ENDOSCOPY;  Service: Gastroenterology;;   CAROTID ENDARTERECTOMY Right 03/2009   CATARACT EXTRACTION  Bilateral    CHOLECYSTECTOMY     CIRCUMCISION     COLONOSCOPY WITH PROPOFOL N/A 02/17/2013   Procedure: COLONOSCOPY WITH PROPOFOL;  Surgeon: Jerene Bears, MD;  Location: [facility] ENDOS[facility];  Service: Gastroenterology;  Laterality: N/A;   COLONOSCOPY WITH PROPOFOL N/A 03/18/2021   Procedure: COLONOSCOPY WITH PROPOFOL;  Surgeon: Carol Ada, MD;  Location: [facility];  Service: Gastroenterology;  Laterality: N/A;   CORONARY ARTERY BYPASS GRAFT  06/14/2011   Procedure: CORONARY ARTERY BYPASS GRAFTING (CABG);  Surgeon: Gaye Pollack, MD;  Location: [place];  Service: Open Heart Surgery;  Laterality: N/A;  Coronary Artery Bypass Graft on pump times six;  utilizing internal mammary artery and right greater saphenous vein harvested endoscopically.x 5 vessels   ELECTROPHYSIOLOGY STUDY N/A 11/05/2012   Procedure: ELECTROPHYSIOLOGY STUDY;  Surgeon: Deboraha Sprang, MD;  Location: [facility] CATH LAB;  Service: Cardiovascular;  Laterality: N/A;   ESOPHAGOGASTRODUODENOSCOPY (EGD) WITH PROPOFOL N/A 02/17/2013   Procedure: ESOPHAGOGASTRODUODENOSCOPY (EGD) WITH PROPOFOL;  Surgeon: Jerene Bears, MD;  [facility] ENDOS[facility];  Service: Gastroenterology;  Laterality: N/A;   ESOPHAGOGASTRODUODENOSCOPY (EGD) WITH PROPOFOL N/A 03/17/2021   Procedure: ESOPHAGOGASTRODUODENOSCOPY (EGD) WITH PROPOFOL;  Surgeon: [name], MD;  Location: [facility];  Service: Gastroenterology;  Laterality: N/A;   FEMORAL-POPLITEAL BYPASS GRAFT Left 03/06/2012   Procedure: BYPASS GRAFT FEMORAL-POPLITEAL ARTERY;  Surgeon: Angelia Mould, MD;  Location: [facility];  Service: Vascular;  Laterality: Left;  Left Femoral - Below Knee Popliteal Bypass Graft with Vein and Intraoperative Arteriogram.    JOINT REPLACEMENT Left    left TKA   KNEE ARTHROSCOPY Left    "I had 3" (11/05/2012)   LEAD REVISION  11/05/2012   pacemaker/notes 11/05/2012   LEAD REVISION N/A 11/05/2012   Procedure: LEAD REVISION;  Surgeon: Deboraha Sprang, MD;  Location: [facility] CATH LAB;  Service: Cardiovascular;  Laterality: N/A;   MAZE  06/14/2011   Procedure: MAZE;  Surgeon: Gaye Pollack, MD;  Location: [facility];  Service: Open Heart Surgery;  Laterality: N/A;  Ligate left atrial appendage   PERMANENT PACEMAKER INSERTION N/A 06/20/2011   Procedure: PERMANENT PACEMAKER INSERTION;  Surgeon: Deboraha Sprang, MD;  Location: [facility] CATH LAB;  Service: Cardiovascular;  Laterality: N/A;   POLYPECTOMY  03/18/2021   Procedure: POLYPECTOMY;  Surgeon: Carol Ada, MD;  Location: [facility] ENDOSCOPY;  Service: Gastroenterology;;   TONSILLECTOMY AND ADENOIDECTOMY     TOTAL KNEE ARTHROPLASTY Left     Family Hx:  Family History  Problem Relation Age of Onset   Coronary artery disease Father    Hypertension Father    Heart disease Father        Heart Disease before age 27   Heart attack Mother    Coronary artery disease Mother    Deep vein thrombosis Mother    Heart disease Mother        Heart Disease before age 21   Hyperlipidemia Mother    Hypertension Mother    Hypertension Sister    Heart disease Sister        Heart Disease before age 44   Hyperlipidemia Sister    Heart disease Brother        Heart Disease before age 31   Colon cancer Brother 48   Anesthesia problems Neg Hx    Diabetes Neg Hx     Social History:  reports that he quit smoking about 43 years ago. His smoking use included cigarettes. He has a 96.00 pack-year smoking history. He has  never used smokeless tobacco. He reports current alcohol use of about 2.0 standard drinks of alcohol per week. He reports that he does not use drugs.  Allergies:  Allergies  Allergen Reactions   Lipitor [Atorvastatin Calcium] Other (See Comments)    Weakness/ pain in legs     Medications: Prior to Admission medications   Medication Sig Start Date End Date Taking? Authorizing Provider  acetaminophen (TYLENOL) 650 MG CR tablet Take 650 mg by mouth every 8 (eight) hours as needed for pain.   Yes [provider]  allopurinol (ZYLOPRIM) 100 MG tablet Take 50 mg by mouth daily.   Yes [provider]  apixaban (ELIQUIS) 2.5 MG TABS tablet Take 2.5 mg by mouth 2 (two) times daily.   Yes [provider]  ascorbic acid (VITAMIN C) 500 MG tablet Take 1,000 mg by mouth every Monday, Wednesday, and Friday.   Yes [provider]  carvedilol (COREG) 6.25 MG tablet Take 1 tablet (6.25 mg total) by mouth 2 (two) times daily. 02/24/19  Yes Nahser, Wonda Cheng, MD  cetirizine (ZYRTEC) 10 MG tablet Take 10 mg by mouth daily.   Yes [provider]  cholecalciferol (VITAMIN D3) 25 MCG (1000 UNIT) tablet Take 2,000 Units by mouth daily.   Yes [provider]  cyanocobalamin 1000 MCG tablet Take 1,000 mcg by mouth daily.   Yes [provider]  ferrous sulfate 325 (65 FE) MG tablet Take 650 mg by mouth every Monday, Wednesday, and Friday.   Yes [provider]  furosemide (LASIX) 40 MG tablet TAKE 2 TABLETS BY MOUTH EVERY MORNING AND TAKE 1 TABLET IN THE AFTERNOON Patient taking differently: Take 60-80 mg by mouth See admin instructions. Take 4 tablets by mouth in the morning and 3 tablets in the evening 03/18/18  Yes Nahser, Wonda Cheng, MD  gabapentin (NEURONTIN) 100 MG capsule Take 200-300 mg by mouth See admin instructions. Take 2 capsules by mouth in the morning and 3 capsules in the evening   Yes [provider]  HYDROcodone-acetaminophen (NORCO/VICODIN) 5-325 MG tablet Take 1 tablet by mouth 2 (two) times daily as needed for moderate pain.   Yes [provider]  insulin glargine (LANTUS) 100 UNIT/ML injection Inject 48 Units into the skin in the morning.   Yes [provider]  isosorbide  mononitrate (IMDUR) 30 MG 24 hr tablet Take 30 mg by mouth daily.   Yes [provider]  levothyroxine (SYNTHROID) 25 MCG tablet Take 25 mcg by mouth daily before breakfast.   Yes [provider]  Magnesium Oxide 420 MG TABS Take 2 tablets by mouth at bedtime.   Yes [provider]  Multiple Vitamin (MULTIVITAMIN) capsule Take 1 capsule by mouth daily.   Yes [provider]  pantoprazole (PROTONIX) 40 MG tablet Take 40 mg by mouth daily.   Yes [provider]  Semaglutide, 1 MG/DOSE, 2 MG/1.5ML SOPN Inject 1 mg into the skin every Sunday.   Yes [provider]  testosterone cypionate (DEPOTESTOSTERONE CYPIONATE) 200 MG/ML injection Inject 200 mg into the muscle every 14 (fourteen) days. Given every other Sunday   Yes [provider]  valsartan (DIOVAN) 80 MG tablet Take 80 mg by mouth daily.   Yes [provider]  albuterol (PROVENTIL HFA;VENTOLIN HFA) 108 (90 Base) MCG/ACT inhaler USE 2 PUFFS EVERY 4 HOURS AS NEEDED FOR WHEEZING Patient not taking: Reported on 03/20/2022 11/11/17   Nahser, Wonda Cheng, MD  blood glucose meter kit and supplies  Dispense based on patient and insurance preference. Use up to four times daily as directed. (FOR ICD-10 E10.9, E11.9). 02/18/17   Scot Jun, NP  potassium chloride (KLOR-CON M) 10 MEQ tablet Take 10 mEq by mouth daily. Patient not taking: Reported on 03/20/2022    [provider]  spironolactone (ALDACTONE) 25 MG tablet Take 1 tablet (25 mg total) by mouth daily. Patient not taking: Reported on 03/20/2022 05/10/21   Dwyane Dee, MD  Zinc 50 MG TABS Take 50 mg by mouth daily. Patient not taking: Reported on 03/20/2022    [provider]    I have reviewed the patient's current medications.  Scheduled:  allopurinol  50 mg Oral Once per day on Mon Thu   apixaban  2.5 mg Oral BID   carvedilol  6.25 mg Oral BID WC   cholecalciferol  1,000 Units Oral Daily   vitamin B-12   1,000 mcg Oral Daily   furosemide  80 mg Intravenous BID   gabapentin  100 mg Oral BID   insulin aspart  0-6 Units Subcutaneous TID WC   levothyroxine  25 mcg Oral Q0600   midodrine  5 mg Oral TID WC   pantoprazole  40 mg Oral BID   vancomycin variable dose per unstable renal function (pharmacist dosing)   Does not apply See admin instructions   zinc sulfate  220 mg Oral Daily   Continuous:  cefTRIAXone (ROCEPHIN)  IV 2 g (03/21/22 0242)    Labs:  Results for orders placed or performed during the hospital encounter of 03/20/22 (from the past 48 hour(s))  Lactic acid     Status: None   Collection Time: 03/20/22 11:12 AM  Result Value Ref Range   Lactic Acid, Venous 1.7 0.5 - 1.9 mmol/L    Comment: Performed at [facility], [address] [place], [place] [address]  Resp panel by RT-PCR (RSV, Flu A&B, Covid) Peripheral     Status: None   Collection Time: 03/20/22 11:14 AM   Specimen: Peripheral; Nasal Swab  Result Value Ref Range   SARS Coronavirus 2 by RT PCR NEGATIVE NEGATIVE   Influenza A by PCR NEGATIVE NEGATIVE   Influenza B by PCR NEGATIVE NEGATIVE    Comment: (NOTE) The Xpert Xpress SARS-CoV-2/FLU/RSV plus assay is intended as an aid in the diagnosis of influenza from Nasopharyngeal swab specimens and should not be used as a sole basis for treatment. Nasal washings and aspirates are unacceptable for Xpert Xpress SARS-CoV-2/FLU/RSV testing.  Fact Sheet for Patients: EntrepreneurPulse.com.au  Fact Sheet for Healthcare Providers: IncredibleEmployment.be  This test is not yet approved or cleared by the [place] FDA and has been authorized for detection and/or diagnosis of SARS-CoV-2 by FDA under an Emergency Use Authorization (EUA). This EUA will remain in effect (meaning this test can be used) for the duration of the COVID-19 declaration under Section 564(b)(1) of the Act, 21 U.S.C. section 360bbb-3(b)(1), unless the  authorization is terminated or revoked.     Resp Syncytial Virus by PCR NEGATIVE NEGATIVE    Comment: (NOTE) Fact Sheet for Patients: EntrepreneurPulse.com.au  Fact Sheet for Healthcare Providers: IncredibleEmployment.be  This test is not yet approved or cleared by the [place] FDA and has been authorized for detection and/or diagnosis of SARS-CoV-2 by FDA under an Emergency Use Authorization (EUA). This EUA will remain in effect (meaning this test can be used) for the duration of the COVID-19 declaration under Section 564(b)(1) of the Act, 21 U.S.C. section 360bbb-3(b)(1),  unless the authorization is terminated or revoked.  Performed at [facility], [place] [address] [place], [place] [address]   CBG monitoring, ED     Status: Abnormal   Collection Time: 03/20/22 11:36 AM  Result Value Ref Range   Glucose-Capillary 121 (H) 70 - 99 mg/dL    Comment: Glucose reference range applies only to samples taken after fasting for at least 8 hours.  CBC with Differential     Status: Abnormal   Collection Time: 03/20/22 11:37 AM  Result Value Ref Range   WBC 14.5 (H) 4.0 - 10.5 K/uL   RBC 3.68 (L) 4.22 - 5.81 MIL/uL   Hemoglobin 9.7 (L) 13.0 - 17.0 g/dL   HCT 33.4 (L) 39.0 - 52.0 %   MCV 90.8 80.0 - 100.0 fL   MCH 26.4 26.0 - 34.0 pg   MCHC 29.0 (L) 30.0 - 36.0 g/dL   RDW 18.3 (H) 11.5 - 15.5 %   Platelets 104 (L) 150 - 400 K/uL    Comment: REPEATED TO VERIFY   nRBC 0.0 0.0 - 0.2 %   Neutrophils Relative % 82 %   Neutro Abs 12.1 (H) 1.7 - 7.7 K/uL   Lymphocytes Relative 5 %   Lymphs Abs 0.7 0.7 - 4.0 K/uL   Monocytes Relative 9 %   Monocytes Absolute 1.3 (H) 0.1 - 1.0 K/uL   Eosinophils Relative 2 %   Eosinophils Absolute 0.3 0.0 - 0.5 K/uL   Basophils Relative 1 %   Basophils Absolute 0.1 0.0 - 0.1 K/uL   Immature Granulocytes 1 %   Abs Immature Granulocytes 0.08 (H) 0.00 - 0.07 K/uL    Comment: Performed at [facility], [address] [place], [place] [address]  Comprehensive metabolic panel     Status: Abnormal   Collection Time: 03/20/22 11:37 AM  Result Value Ref Range   Sodium 135 135 - 145 mmol/L   Potassium 4.3 3.5 - 5.1 mmol/L   Chloride 102 98 - 111 mmol/L   CO2 22 22 - 32 mmol/L   Glucose, Bld 127 (H) 70 - 99 mg/dL    Comment: Glucose reference range applies only to samples taken after fasting for at least 8 hours.   BUN 73 (H) 8 - 23 mg/dL   Creatinine, Ser 5.04 (H) 0.61 - 1.24 mg/dL   Calcium 8.6 (L) 8.9 - 10.3 mg/dL   Total Protein 7.0 6.5 - 8.1 g/dL   Albumin 3.1 (L) 3.5 - 5.0 g/dL   AST 26 15 - 41 U/L   ALT 12 0 - 44 U/L   Alkaline Phosphatase 115 38 - 126 U/L   Total Bilirubin 1.9 (H) 0.3 - 1.2 mg/dL   GFR, Estimated 11 (L) >60 mL/min    Comment: (NOTE) Calculated using the CKD-EPI Creatinine Equation (2021)    Anion gap 11 5 - 15    Comment: Performed at [facility], [place] [address] [place], [place] [address]  Brain natriuretic peptide     Status: Abnormal   Collection Time: 03/20/22 11:37 AM  Result Value Ref Range   B Natriuretic Peptide 131.2 (H) 0.0 - 100.0 pg/mL    Comment: Performed at [facility] [address] [place], [place] [address]  Blood Cultures x 2 sites     Status: None (Preliminary result)   Collection Time: 03/20/22 11:37 AM   Specimen: BLOOD LEFT HAND  Result Value Ref Range   Specimen Description BLOOD LEFT HAND  Special Requests      BOTTLES DRAWN AEROBIC AND ANAEROBIC Blood Culture adequate volume   Culture      NO GROWTH < 24 HOURS Performed at [facility], [place] [address], [place], [place] [address]    Report Status PENDING   Blood Cultures x 2 sites     Status: None (Preliminary result)   Collection Time: 03/20/22 11:56 AM   Specimen: BLOOD  Result Value Ref Range   Specimen Description BLOOD LEFT ANTECUBITAL    Special Requests      BOTTLES DRAWN AEROBIC AND ANAEROBIC Blood Culture adequate volume   Culture      NO  GROWTH < 24 HOURS Performed at [facility], [place] [address], [place], [place] [address]    Report Status PENDING   Hemoglobin A1c     Status: Abnormal   Collection Time: 03/20/22  4:35 PM  Result Value Ref Range   Hgb A1c MFr Bld 6.1 (H) 4.8 - 5.6 %    Comment: (NOTE)         Prediabetes: 5.7 - 6.4         Diabetes: >6.4         Glycemic control for adults with diabetes: <7.0    Mean Plasma Glucose 128 mg/dL    Comment: (NOTE) Performed At: [facility] [place], [place] [id number] Rush Farmer MD [id number]   TSH     Status: Abnormal   Collection Time: 03/20/22  4:35 PM  Result Value Ref Range   TSH 6.516 (H) 0.350 - 4.500 uIU/mL    Comment: Performed by a 3rd Generation assay with a functional sensitivity of <=0.01 uIU/mL. Performed at [facility], [place] [address], [place], [place] [address]   HIV Antibody (routine testing w rflx)     Status: None   Collection Time: 03/20/22  4:35 PM  Result Value Ref Range   HIV Screen 4th Generation wRfx Non Reactive Non Reactive    Comment: Performed at [facility], [place] [address], [place], [place] [address]  Sedimentation rate     Status: Abnormal   Collection Time: 03/20/22  4:35 PM  Result Value Ref Range   Sed Rate 30 (H) 0 - 16 mm/hr    Comment: Performed at [facility] [address], [place], [place] [address]  C-reactive protein     Status: Abnormal   Collection Time: 03/20/22  4:35 PM  Result Value Ref Range   CRP 18.6 (H) <1.0 mg/dL    Comment: Performed at [facility], [place] [address] [place], [place] [address]  Glucose, capillary     Status: Abnormal   Collection Time: 03/20/22  8:28 PM  Result Value Ref Range   Glucose-Capillary 149 (H) 70 - 99 mg/dL    Comment: Glucose reference range applies only to samples taken after fasting for at least 8 hours.  Comprehensive metabolic panel     Status: Abnormal   Collection Time: 03/21/22  1:08 AM  Result Value Ref  Range   Sodium 134 (L) 135 - 145 mmol/L   Potassium 4.6 3.5 - 5.1 mmol/L   Chloride 100 98 - 111 mmol/L   CO2 21 (L) 22 - 32 mmol/L   Glucose, Bld 132 (H) 70 - 99 mg/dL    Comment: Glucose reference range applies only to samples taken after fasting for at least 8 hours.   BUN 82 (H) 8 - 23 mg/dL  Creatinine, Ser 5.11 (H) 0.61 - 1.24 mg/dL   Calcium 8.4 (L) 8.9 - 10.3 mg/dL   Total Protein 6.7 6.5 - 8.1 g/dL   Albumin 2.9 (L) 3.5 - 5.0 g/dL   AST 23 15 - 41 U/L   ALT 12 0 - 44 U/L   Alkaline Phosphatase 102 38 - 126 U/L   Total Bilirubin 1.6 (H) 0.3 - 1.2 mg/dL   GFR, Estimated 11 (L) >60 mL/min    Comment: (NOTE) Calculated using the CKD-EPI Creatinine Equation (2021)    Anion gap 13 5 - 15    Comment: Performed at [facility] [address], [place], [place] [address]  CBC     Status: Abnormal   Collection Time: 03/21/22  1:08 AM  Result Value Ref Range   WBC 10.4 4.0 - 10.5 K/uL   RBC 3.57 (L) 4.22 - 5.81 MIL/uL   Hemoglobin 9.1 (L) 13.0 - 17.0 g/dL   HCT 31.5 (L) 39.0 - 52.0 %   MCV 88.2 80.0 - 100.0 fL   MCH 25.5 (L) 26.0 - 34.0 pg   MCHC 28.9 (L) 30.0 - 36.0 g/dL   RDW 18.2 (H) 11.5 - 15.5 %   Platelets 106 (L) 150 - 400 K/uL    Comment: CONSISTENT WITH PREVIOUS RESULT REPEATED TO VERIFY    nRBC 0.0 0.0 - 0.2 %    Comment: Performed at [facility], [place] [address] [place], [place] [address]  Protime-INR     Status: Abnormal   Collection Time: 03/21/22  1:08 AM  Result Value Ref Range   Prothrombin Time 21.9 (H) 11.4 - 15.2 seconds   INR 1.9 (H) 0.8 - 1.2    Comment: (NOTE) INR goal varies based on device and disease states. Performed at [facility], [place] [address], [place], [place] [address]   Magnesium     Status: None   Collection Time: 03/21/22  1:08 AM  Result Value Ref Range   Magnesium 2.2 1.7 - 2.4 mg/dL    Comment: Performed at [facility] [address] [place], [place] [address]  Lipid panel     Status: Abnormal    Collection Time: 03/21/22  1:08 AM  Result Value Ref Range   Cholesterol 88 0 - 200 mg/dL   Triglycerides 68 <150 mg/dL   HDL 26 (L) >40 mg/dL   Total CHOL/HDL Ratio 3.4 RATIO   VLDL 14 0 - 40 mg/dL   LDL Cholesterol 48 0 - 99 mg/dL    Comment:        Total Cholesterol/HDL:CHD Risk Coronary Heart Disease Risk Table                     Men   Women  1/2 Average Risk   3.4   3.3  Average Risk       5.0   4.4  2 X Average Risk   9.6   7.1  3 X Average Risk  23.4   11.0        Use the calculated Patient Ratio above and the CHD Risk Table to determine the patient's CHD Risk.        ATP III CLASSIFICATION (LDL):  <100     mg/dL   Optimal  100-129  mg/dL   Near or Above                    Optimal  130-159  mg/dL  Borderline  160-189  mg/dL   High  >190     mg/dL   Very High Performed at [facility] [address] [place], [place] [address]   RPR     Status: None   Collection Time: 03/21/22  1:08 AM  Result Value Ref Range   RPR Ser Ql NON REACTIVE NON REACTIVE    Comment: Performed at [facility] [address], [place], [place] [address]  Glucose, capillary     Status: Abnormal   Collection Time: 03/21/22  5:52 AM  Result Value Ref Range   Glucose-Capillary 116 (H) 70 - 99 mg/dL    Comment: Glucose reference range applies only to samples taken after fasting for at least 8 hours.     ROS:  Pertinent items are noted in HPI.  Physical Exam: Vitals:   03/21/22 0554 03/21/22 0749  BP: (!) 127/58 111/61  Pulse: 71   Resp: 17   Temp: 97.9 F (36.6 C) 97.9 F (36.6 C)  SpO2: 100% 99%     General: no apparent acute distress Heart: irregular, no murmurs, bilateral LE pitting edema Lungs: breathing comfortably on room air, clear to auscultation of anterior fields Extremities: warmth, erythema, and tenderness of LLE Skin: warm and dry Neuro: no gross focal deficits  Assessment/Plan:  AKI on CKD IV Creatinine approximately 5, up from 2.5 at baseline.   Treating as secondary to volume overload and renal venous congestion.  However, blood pressures are a bit tenuous, with some softer values charted.  Given comorbid cirrhosis, do not want to risk intravascular depletion or precipitation of ATN.  Notably, this person is adamantly opposed to dialysis at this time.  Fortunately, he is not at that point yet.  Will augment blood pressure while diuresing. - Midodrine 5 mg 3 times daily with meals - Furosemide 80 mg twice daily - Monitor I's/O  Volume overload state In setting of acute decompensated diastolic heart failure, cirrhosis, and hypoalbuminemia.  EF preserved on echo with evidence of LA and RA dilatation and elevated RA pressures. This patient does report a little bit of abdominal distention.  Wonder if he may be carrying some ascites.  Has had paracentesis in the past, reports that in April 2023, 1.5 L were removed.  PT/INR elevated but stable for the last year or so.  Mild thrombocytopenia is new. - Diuresis as above  Normocytic anemia Chronic and stable.  In setting of chronic liver disease, kidney disease, and chronic diastolic heart failure.  LLE cellulitis Antibiotics per primary team.  Diabetes Well-controlled.  Insulin per primary team  Hypertension Continue holding ARB while diuresing.  Nani Gasser 03/21/2022, 11:41 AM

## 2022-03-21 NOTE — Progress Notes (Signed)
Pharmacy Antibiotic Note  Edward [name] is a 82 y.o. male admitted on 03/20/2022 with cellulitis.  Pharmacy has been consulted for vancomycin dosing.  Pt w/ acute on chronic renal failure; baseline SCr ~3, now 5.  Plan: Rec'd vanc 1g in ED; will give another vancomycin 1g IV to complete load then monitor SCr +/- vanc levels prior to redosing.  Height: '5\' 10"'$  (177.8 cm) Weight: 108 kg (238 lb 1.6 oz) IBW/kg (Calculated) : 73  Temp (24hrs), Avg:97.3 F (36.3 C), Min:97.1 F (36.2 C), Max:97.4 F (36.3 C)  Recent Labs  Lab 03/20/22 1112 03/20/22 1137  WBC  --  14.5*  CREATININE  --  5.04*  LATICACIDVEN 1.7  --     Estimated Creatinine Clearance: 14.6 mL/min (A) (by C-G formula based on SCr of 5.04 mg/dL (H)).    Allergies  Allergen Reactions   Lipitor [Atorvastatin Calcium] Other (See Comments)    Weakness/ pain in legs    Thank you for allowing pharmacy to be a part of this patient's care.  Wynona Neat, PharmD, BCPS  03/21/2022 1:34 AM

## 2022-03-21 NOTE — Progress Notes (Signed)
Daily Progress Note Intern Pager: [phone]  Patient name: Edward [name] Medical record number: [id number] Date of birth: [birth year] Age: 82 y.o. Gender: male  Primary Care Provider: Clinic, [facility] Consultants: Nephrology Code Status: Full Code  Pt Overview and Major Events to Date:  -Admitted 3/5  Assessment and Plan:  Edward [name] is a 82 y.o. admitted for acute heart failure exacerbation in the setting of chronic kidney disease and concomitant left lower extremity cellulitis. Overall stable, ongoing management. Pertinent PMHx includes CAD, s/p CABG, a-fib on eliquis, cirrhosis, CKD4, COPD, CHF. Hx of tubular adenoma of the colon, PAD, and diabetes.   * Acute exacerbation of CHF (congestive heart failure) ([place]) Respiratory status continues to be stable. Still grossly volume overloaded. Diurese per nephrology. -F/u ECHO -Strict Is/Os -Daily Weight -IV Lasix '80mg'$  BID -PT/OT Eval -Daily Lab: CMP, CBC -Cardiorespiratory Monitoring     Cellulitis Exam unchanged from prior. Remains afebrile. CRP and ESR elevated to 18.6, and 30 respectively. DG Knee negative for intra-joint edema.  -Continue Vancomycin and Ceftriaxone -Monitor fever -Bcs in process -Consider MRI  Acute-on-chronic kidney injury ([place]) Creatinine increased to 5.11 from 5.04. BUN 82.  -Nephrology consulted, recs appreciated -IV diuresis as below monitor kidney function -AM CMP -Hold home Valsartan  Diabetes mellitus (HCC) Last A1c 6.2.  Home insulin 48 units Lantus daily, Semaglutide. Stable glucoses overnight. -Very sensitive sliding scale -CBGs with meals and at night  HTN (hypertension) BP mildly improved from admission.  Hold home Imdur.  Hold home valsartan. -Continue spironolactone -Continue Coreg    FEN/GI: [place] healthy/Carb modified PPx: Lovenox Dispo:Pending clinical improvement  Subjective:  No acute events overnight.  States he is urinating more than he was previously  over the past 5 weeks.  States his breathing is fine.  Does not feel that rash is worsened or improved. Discussed need to urinate in container for accurate tracking of output.  Objective: Temp:  [97.1 F (36.2 C)-97.9 F (36.6 C)] 97.9 F (36.6 C) (03/06 0749) Pulse Rate:  [62-71] 71 (03/06 0554) Resp:  [12-18] 17 (03/06 0554) BP: (92-127)/(55-73) 111/61 (03/06 0749) SpO2:  [97 %-100 %] 99 % (03/06 0749) Weight:  [110.3 kg] 110.3 kg (03/06 0554) Physical Exam: General: NAD  Neuro: A&O Cardiovascular: RRR, no murmurs, 2-3+ bilateral edema Respiratory: normal WOB on RA, diffuse crackles Abdomen: soft, NTTP, no rebound or guarding Extremities: Moving all 4 extremities equally, red edematous rash over anterior upper leg and lower leg   Laboratory: Most recent CBC Lab Results  Component Value Date   WBC 10.4 03/21/2022   HGB 9.1 (L) 03/21/2022   HCT 31.5 (L) 03/21/2022   MCV 88.2 03/21/2022   PLT 106 (L) 03/21/2022   Most recent BMP    Latest Ref Rng & Units 03/21/2022    1:08 AM  BMP  Glucose 70 - 99 mg/dL 132   BUN 8 - 23 mg/dL 82   Creatinine 0.61 - 1.24 mg/dL 5.11   Sodium 135 - 145 mmol/L 134   Potassium 3.5 - 5.1 mmol/L 4.6   Chloride 98 - 111 mmol/L 100   CO2 22 - 32 mmol/L 21   Calcium 8.9 - 10.3 mg/dL 8.4     Other pertinent labs: PT/INR - 1.9, Mag - 2.2, HDL - 26, LDL 48, Glu 116<149<121  Imaging/Diagnostic Tests:  XR Knee 1. No acute fracture or dislocation. 2. Arthroplasty appears intact. 3. Diffuse subcutaneous edema.  Salvadore Oxford, MD 03/21/2022, 12:08 PM  PGY-1, [facility] Intern pager: [phone], text pages welcome Secure chat group [place]

## 2022-03-21 NOTE — Progress Notes (Signed)
  Echocardiogram 2D Echocardiogram has been performed.  Edward [name] 03/21/2022, 9:13 AM

## 2022-03-22 ENCOUNTER — Inpatient Hospital Stay (HOSPITAL_COMMUNITY): Payer: No Typology Code available for payment source

## 2022-03-22 DIAGNOSIS — N179 Acute kidney failure, unspecified: Secondary | ICD-10-CM | POA: Diagnosis not present

## 2022-03-22 DIAGNOSIS — K7460 Unspecified cirrhosis of liver: Secondary | ICD-10-CM

## 2022-03-22 DIAGNOSIS — E8770 Fluid overload, unspecified: Secondary | ICD-10-CM

## 2022-03-22 DIAGNOSIS — I509 Heart failure, unspecified: Secondary | ICD-10-CM | POA: Diagnosis not present

## 2022-03-22 LAB — GLUCOSE, CAPILLARY
Glucose-Capillary: 150 mg/dL — ABNORMAL HIGH (ref 70–99)
Glucose-Capillary: 231 mg/dL — ABNORMAL HIGH (ref 70–99)
Glucose-Capillary: 153 mg/dL — ABNORMAL HIGH (ref 70–99)
Glucose-Capillary: 124 mg/dL — ABNORMAL HIGH (ref 70–99)

## 2022-03-22 LAB — BASIC METABOLIC PANEL
Anion gap: 10 (ref 5–15)
BUN: 83 mg/dL — ABNORMAL HIGH (ref 8–23)
CO2: 22 mmol/L (ref 22–32)
Calcium: 8.3 mg/dL — ABNORMAL LOW (ref 8.9–10.3)
Chloride: 101 mmol/L (ref 98–111)
Creatinine, Ser: 4.84 mg/dL — ABNORMAL HIGH (ref 0.61–1.24)
GFR, Estimated: 12 mL/min — ABNORMAL LOW (ref >60)
Glucose, Bld: 156 mg/dL — ABNORMAL HIGH (ref 70–99)
Potassium: 4.2 mmol/L (ref 3.5–5.1)
Sodium: 133 mmol/L — ABNORMAL LOW (ref 135–145)

## 2022-03-22 LAB — CBC
HCT: 28.7 % — ABNORMAL LOW (ref 39.0–52.0)
Hemoglobin: 9 g/dL — ABNORMAL LOW (ref 13.0–17.0)
MCH: 26.6 pg (ref 26.0–34.0)
MCHC: 31.4 g/dL (ref 30.0–36.0)
MCV: 84.9 fL (ref 80.0–100.0)
Platelets: 114 10*3/uL — ABNORMAL LOW (ref 150–400)
RBC: 3.38 MIL/uL — ABNORMAL LOW (ref 4.22–5.81)
RDW: 17.9 % — ABNORMAL HIGH (ref 11.5–15.5)
WBC: 8 10*3/uL (ref 4.0–10.5)
nRBC: 0 % (ref 0.0–0.2)

## 2022-03-22 LAB — HEPATITIS C ANTIBODY: HCV Ab: NONREACTIVE

## 2022-03-22 LAB — HEPATITIS B SURFACE ANTIBODY,QUALITATIVE: Hep B S Ab: NONREACTIVE

## 2022-03-22 LAB — HEPATITIS A ANTIBODY, TOTAL: hep A Total Ab: REACTIVE — AB

## 2022-03-22 LAB — MAGNESIUM: Magnesium: 2.1 mg/dL (ref 1.7–2.4)

## 2022-03-22 LAB — VANCOMYCIN, RANDOM: Vancomycin Rm: 13 ug/mL

## 2022-03-22 MED ORDER — VANCOMYCIN HCL IN DEXTROSE 1-5 GM/200ML-% IV SOLN
1000.0000 mg | Freq: Once | INTRAVENOUS | Status: AC
  Administered 2022-03-22: 1000 mg via INTRAVENOUS
  Filled 2022-03-22: qty 200

## 2022-03-22 NOTE — Progress Notes (Signed)
[facility] Progress Note   Assessment/ Plan:   82 year old male with history of CKD, diastolic heart failure, cirrhosis, admitted for cellulitis and found to be volume overloaded with AKI.  1. Acute kidney Injury CKD IV - Cr improved to 4.8 from 5.1 after furosemide and augmentation with midodrine. Still grossly volume overloaded on exam. No urinary obstruction on ultrasound. Continue diuresing.  - Furosemide 80 mg BID  - Midodrine 5 mg TID  2. Volume overload - CHF and cirrhosis. Mild hyponatremia is stable. Abdominal ultrasound revealed small volume ascites. Agree with GI consult. Approximately 3000 mL UOP yesterday. Wife reports dry weight ~210 lbs. Continue diuresis as above.  3. Anemia - chronic and stable.  4. LLE cellulitis - no signs of systemic infection, fortunately. Reports significant pain in the L knee, prior arthroplasty. Agree with ruling out septic arthritis.  5. Diabetes - insulin per primary.  6. Hypertension - continue holding ARB to defend GFR.   Subjective:   Reports L knee pain that is severe. Ongoing severe swelling. No SOB at rest, but still with dyspnea on exertion   Objective:   BP (!) 122/56   Pulse 63   Temp (!) 97.4 F (36.3 C) (Oral)   Resp 20   Ht '5\' 10"'$  (1.778 m)   Wt 108 kg   SpO2 99%   BMI 34.16 kg/m   Intake/Output Summary (Last 24 hours) at 03/22/2022 1113 Last data filed at 03/22/2022 [id number] Gross per 24 hour  Intake 990 ml  Output 3375 ml  Net -2385 ml   Weight change: 0 kg  Physical Exam: General: no apparent acute distress Heart: irregular, bilateral LE pitting edema Lungs: breathing comfortably on room air Abdomen: non-tender, dullness to percussion Extremities: warmth, erythema, and tenderness of LLE Skin: warm and dry Neuro: no gross focal deficits  Imaging: [place] COMPLETE JOINT SPACE STRUCTURES LOW LEFT  Result Date: 03/22/2022 CLINICAL DATA:  Knee pain.  Cellulitis. EXAM: LEFT LOWER EXTREMITY SOFT TISSUE  ULTRASOUND COMPLETE TECHNIQUE: Ultrasound examination was performed including evaluation of the muscles, tendons, joint, and adjacent soft tissues. COMPARISON:  Knee radiograph March 5 24. FINDINGS: Interspersed subcutaneous edema throughout the visualized left knee. No discrete, drainable fluid collection identified. IMPRESSION: Interspersed subcutaneous edema throughout the visualized left knee. No discrete, drainable fluid collection identified. Electronically Signed   By: Margaretha Sheffield M.D.   On: 03/22/2022 10:42   US Abdomen Complete  Result Date: 03/21/2022 CLINICAL DATA:  Renal dysfunction EXAM: ABDOMEN ULTRASOUND COMPLETE COMPARISON:  11/20/2021 FINDINGS: Gallbladder: Not seen consistent with cholecystectomy Common bile duct: Diameter: 2.1 mm Liver: There is nodularity in liver surface suggesting possible cirrhosis. No focal abnormalities are seen in visualized portions of liver. Portal vein is patent on color Doppler imaging with normal direction of blood flow towards the liver. IVC: No abnormality visualized. Pancreas: Visualized portion unremarkable. Spleen: Spleen measures 16.6 cm in maximum diameter. Right Kidney: Length: 9.7 cm. Echogenicity within normal limits. No mass or hydronephrosis visualized. Left Kidney: Length: 9.6 cm. Echogenicity within normal limits. No mass or hydronephrosis visualized. Abdominal aorta: No aneurysm visualized. Other findings: Small ascites is noted adjacent to the liver and spleen. IMPRESSION: Small ascites. Nodularity in liver surface suggests cirrhosis. Splenomegaly. Status post cholecystectomy. No sonographic abnormalities are seen in the kidneys. Electronically Signed   By: Elmer Picker M.D.   On: 03/21/2022 16:57   ECHOCARDIOGRAM COMPLETE  Result Date: 03/21/2022    ECHOCARDIOGRAM REPORT   Patient Name:   Edward [name] Date of Exam: 03/21/2022 Medical Rec #:  [id number]       Height:       70.0 in Accession #:    [id number]      Weight:       243.2 lb  Date of Birth:  [birth year]       BSA:          2.268 m Patient Age:    53 years        BP:           127/58 mmHg Patient Gender: M               HR:           72 bpm. Exam Location:  Inpatient Procedure: 2D Echo Indications:    dyspnea  History:        Patient has prior history of Echocardiogram examinations, most                 recent 05/07/2021. Pacemaker, Arrythmias:Atrial Fibrillation and                 LBBB; Risk Factors:Hypertension and Diabetes.  Sonographer:    Harvie Junior Referring Phys: [id number] Tawanna Solo T ENIOLA  Sonographer Comments: Technically difficult study due to poor echo windows. IMPRESSIONS  1. Left ventricular ejection fraction, by estimation, is 55 to 60%. The left ventricle has normal function. The left ventricle has no regional wall motion abnormalities. There is moderate concentric left ventricular hypertrophy. Left ventricular diastolic parameters are indeterminate.  2. Right ventricular systolic function is moderately reduced. The right ventricular size is normal. There is mildly elevated pulmonary artery systolic pressure. The estimated right ventricular systolic pressure is [id number] mmHg.  3. Left atrial size was mildly dilated.  4. Right atrial size was mildly dilated.  5. The mitral valve is normal in structure. Trivial mitral valve regurgitation. No evidence of mitral stenosis. Moderate mitral annular calcification.  6. The aortic valve is tricuspid. There is moderate calcification of the aortic valve. Aortic valve regurgitation is not visualized. Aortic valve sclerosis/calcification is present, without any evidence of aortic stenosis.  7. The inferior vena cava is dilated in size with <50% respiratory variability, suggesting right atrial pressure of 15 mmHg.  8. The patient appeared to be in atrial fibrillation. FINDINGS  Left Ventricle: Left ventricular ejection fraction, by estimation, is 55 to 60%. The left ventricle has normal function. The left ventricle has no regional wall motion  abnormalities. The left ventricular internal cavity size was normal in size. There is  moderate concentric left ventricular hypertrophy. Left ventricular diastolic parameters are indeterminate. Right Ventricle: The right ventricular size is normal. No increase in right ventricular wall thickness. Right ventricular systolic function is moderately reduced. There is mildly elevated pulmonary artery systolic pressure. The tricuspid regurgitant velocity is 2.47 m/s, and with an assumed right atrial pressure of 15 mmHg, the estimated right ventricular systolic pressure is [id number] mmHg. Left Atrium: Left atrial size was mildly dilated. Right Atrium: Right atrial size was mildly dilated. Pericardium: There is no evidence of pericardial effusion. Mitral Valve: The mitral valve is normal in structure. There is mild calcification of the mitral valve leaflet(s). Moderate mitral annular calcification. Trivial mitral valve regurgitation. No evidence of mitral valve stenosis. Tricuspid Valve: The tricuspid valve is normal in structure. Tricuspid valve regurgitation is trivial. Aortic Valve: The aortic valve is tricuspid. There is moderate calcification of the aortic valve. Aortic valve regurgitation is not visualized. Aortic valve sclerosis/calcification  is present, without any evidence of aortic stenosis. Aortic valve mean gradient measures 6.0 mmHg. Aortic valve peak gradient measures 11.6 mmHg. Aortic valve area, by VTI measures 1.98 cm. Pulmonic Valve: The pulmonic valve was normal in structure. Pulmonic valve regurgitation is trivial. Aorta: The aortic root is normal in size and structure. Venous: The inferior vena cava is dilated in size with less than 50% respiratory variability, suggesting right atrial pressure of 15 mmHg. IAS/Shunts: No atrial level shunt detected by color flow Doppler. Additional Comments: A device lead is visualized in the right ventricle.  LEFT VENTRICLE PLAX 2D LVIDd:         4.00 cm     Diastology  LVIDs:         2.60 cm     LV e' medial:    5.08 cm/s LV PW:         1.30 cm     LV E/e' medial:  23.6 LV IVS:        1.50 cm     LV e' lateral:   6.17 cm/s LVOT diam:     2.10 cm     LV E/e' lateral: 19.4 LV SV:         65 LV SV Index:   29 LVOT Area:     3.46 cm                             3D Volume EF: LV Volumes (MOD)           3D EF:        58 % LV vol d, MOD A2C: 90.5 ml LV EDV:       163 ml LV vol d, MOD A4C: 91.6 ml LV ESV:       68 ml LV vol s, MOD A2C: 47.0 ml LV SV:        94 ml LV vol s, MOD A4C: 41.3 ml LV SV MOD A2C:     43.5 ml LV SV MOD A4C:     91.6 ml LV SV MOD BP:      46.7 ml RIGHT VENTRICLE RV Basal diam:  3.60 cm RV Mid diam:    3.00 cm RV S prime:     4.93 cm/s LEFT ATRIUM             Index        RIGHT ATRIUM           Index LA diam:        4.30 cm 1.90 cm/m   RA Area:     24.60 cm LA Vol (A2C):   55.2 ml 24.34 ml/m  RA Volume:   82.50 ml  36.37 ml/m LA Vol (A4C):   68.1 ml 30.03 ml/m LA Biplane Vol: 62.8 ml 27.69 ml/m  AORTIC VALVE                     PULMONIC VALVE AV Area (Vmax):    1.94 cm      PV Vmax:       0.94 m/s AV Area (Vmean):   1.84 cm      PV Peak grad:  3.6 mmHg AV Area (VTI):     1.98 cm AV Vmax:           170.00 cm/s AV Vmean:          115.250 cm/s AV VTI:  0.328 m AV Peak Grad:      11.6 mmHg AV Mean Grad:      6.0 mmHg LVOT Vmax:         95.10 cm/s LVOT Vmean:        61.150 cm/s LVOT VTI:          0.188 m LVOT/AV VTI ratio: 0.57  AORTA Ao Root diam: 3.10 cm Ao Asc diam:  3.20 cm MITRAL VALVE                TRICUSPID VALVE MV Area (PHT): 4.22 cm     TR Peak grad:   24.4 mmHg MV Decel Time: 180 msec     TR Vmax:        247.00 cm/s MR Peak grad: 16.8 mmHg MR Vmax:      205.00 cm/s   SHUNTS MV E velocity: 120.00 cm/s  Systemic VTI:  0.19 m MV A velocity: 42.40 cm/s   Systemic Diam: 2.10 cm MV E/A ratio:  2.83 Dalton McleanMD Electronically signed by Franki Monte Signature Date/Time: 03/21/2022/1:31:42 PM    Final    VAS [place] LOWER EXTREMITY VENOUS  (DVT)  Result Date: 03/20/2022  Lower Venous DVT Study Patient Name:  Edward [name]  Date of Exam:   03/20/2022 Medical Rec #: [id number]        Accession #:    [id number] Date of Birth: 09-14-1940        Patient Gender: M Patient Age:   42 years Exam Location:  [facility] Procedure:      VAS [place] LOWER EXTREMITY VENOUS (DVT) Referring Phys: ABIGAIL HARRIS --------------------------------------------------------------------------------  Indications: Left leg swelling, warmth, redness.  Risk Factors: Remote history of left femoral-popliteal bypass. Limitations: Poor ultrasound/tissue interface and patient pain. Comparison Study: 08-02-2017 Prior lower extremity venous study was negative for                   DVT. Performing Technologist: Darlin Coco RDMS, RVT  Examination Guidelines: A complete evaluation includes B-mode imaging, spectral Doppler, color Doppler, and power Doppler as needed of all accessible portions of each vessel. Bilateral testing is considered an integral part of a complete examination. Limited examinations for reoccurring indications may be performed as noted. The reflux portion of the exam is performed with the patient in reverse Trendelenburg.  +-----+---------------+---------+-----------+----------+--------------+ RIGHTCompressibilityPhasicitySpontaneityPropertiesThrombus Aging +-----+---------------+---------+-----------+----------+--------------+ CFV  Full           Yes      Yes                                 +-----+---------------+---------+-----------+----------+--------------+   +---------+---------------+---------+-----------+----------+--------------+ LEFT     CompressibilityPhasicitySpontaneityPropertiesThrombus Aging +---------+---------------+---------+-----------+----------+--------------+ CFV      Full           Yes      Yes                                 +---------+---------------+---------+-----------+----------+--------------+ SFJ      Full                                                         +---------+---------------+---------+-----------+----------+--------------+ FV Prox  Full                                                        +---------+---------------+---------+-----------+----------+--------------+  FV Mid   Full                                                        +---------+---------------+---------+-----------+----------+--------------+ FV DistalFull                                                        +---------+---------------+---------+-----------+----------+--------------+ PFV      Full                                                        +---------+---------------+---------+-----------+----------+--------------+ POP      Full           Yes      Yes                                 +---------+---------------+---------+-----------+----------+--------------+ PTV      Full                                                        +---------+---------------+---------+-----------+----------+--------------+ PERO     Full                                                        +---------+---------------+---------+-----------+----------+--------------+    Summary: RIGHT: - No evidence of common femoral vein obstruction.  LEFT: - There is no evidence of deep vein thrombosis in the lower extremity.  - No cystic structure found in the popliteal fossa. - Ultrasound characteristics of enlarged lymph nodes noted in the groin.  *See table(s) above for measurements and observations. Electronically signed by Harold Barban MD on 03/20/2022 at 10:10:42 PM.    Final    DG Knee 1-2 Views Left  Result Date: 03/20/2022 CLINICAL DATA:  Left knee edema and redness. EXAM: LEFT KNEE - 1-2 VIEW COMPARISON:  None Available. 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 The arthroplasty components appear intact and in anatomic alignment. There is no acute fracture or dislocation. The bones are  osteopenic. No joint effusion. Vascular calcifications and multiple surgical clips. There is diffuse subcutaneous edema. IMPRESSION: 1. No acute fracture or dislocation. 2. Arthroplasty appears intact. 3. Diffuse subcutaneous edema. Electronically Signed   By: Anner Crete M.D.   On: 03/20/2022 17:17   DG Chest Port 1 View  Result Date: 03/20/2022 CLINICAL DATA:  Distention, history CHF EXAM: PORTABLE CHEST 1 VIEW COMPARISON:  05/06/2021 FINDINGS: Cardiac silhouette grossly enlarged. Pulmonary vascular congestion without focal consolidation. Left-sided pacer. Calcified aorta. Median sternotomy wires. Left atrial clip. IMPRESSION: Enlarged cardiac silhouette.  Pulmonary vascular congestion. Electronically Signed   By: Sammie Bench  M.D.   On: 03/20/2022 11:30    Labs: BMET Recent Labs  Lab 03/20/22 1137 03/21/22 0108 03/22/22 0044  NA 135 134* 133*  K 4.3 4.6 4.2  CL 102 100 101  CO2 22 21* 22  GLUCOSE 127* 132* 156*  BUN 73* 82* 83*  CREATININE 5.04* 5.11* 4.84*  CALCIUM 8.6* 8.4* 8.3*   CBC Recent Labs  Lab 03/20/22 1137 03/21/22 0108 03/22/22 0044  WBC 14.5* 10.4 8.0  NEUTROABS 12.1*  --   --   HGB 9.7* 9.1* 9.0*  HCT 33.4* 31.5* 28.7*  MCV 90.8 88.2 84.9  PLT 104* 106* 114*    Medications:     allopurinol  50 mg Oral Once per day on Mon Thu   apixaban  2.5 mg Oral BID   carvedilol  6.25 mg Oral BID WC   cholecalciferol  1,000 Units Oral Daily   vitamin B-12  1,000 mcg Oral Daily   furosemide  80 mg Intravenous BID   gabapentin  100 mg Oral BID   insulin aspart  0-6 Units Subcutaneous TID WC   levothyroxine  25 mcg Oral Q0600   midodrine  5 mg Oral TID WC   pantoprazole  40 mg Oral BID   vancomycin variable dose per unstable renal function (pharmacist dosing)   Does not apply See admin instructions   zinc sulfate  220 mg Oral Daily    Nani Gasser MD 03/22/2022, 11:13 AM

## 2022-03-22 NOTE — Progress Notes (Signed)
Physical Therapy Treatment Patient Details Name: Edward [name] MRN: [id number] DOB: 17-Jun-1940 Today's Date: 03/22/2022   History of Present Illness Pt is a 82 yo male admitted with swelling in LLE with redness and pain. Neg for DVT PMH:CKD, CVA, CAD, DM, HTN, PAF, LTKA    PT Comments    Pt tolerated treatment well. Pt was able to ambulate in the hallway independently and navigate stairs. Pt has met all acute PT goals and has no post acute PT needs. Pt would benefit from continued mobility with mobility specialist during acute stay. Re consult PT if mobility status changes.  Recommendations for follow up therapy are one component of a multi-disciplinary discharge planning process, led by the attending physician.  Recommendations may be updated based on patient status, additional functional criteria and insurance authorization.  Follow Up Recommendations  No PT follow up     Assistance Recommended at Discharge PRN  Patient can return home with the following A little help with walking and/or transfers;A little help with bathing/dressing/bathroom;Assistance with cooking/housework;Assist for transportation;Help with stairs or ramp for entrance   Equipment Recommendations  Other (comment) (Pt has needed DME)    Recommendations for Other Services       Precautions / Restrictions Precautions Precautions: Fall Restrictions Weight Bearing Restrictions: No     Mobility  Bed Mobility Overal bed mobility: Modified Independent             General bed mobility comments: pt used bedrails    Transfers Overall transfer level: Modified independent Equipment used: Rolling walker (2 wheels) Transfers: Sit to/from Stand Sit to Stand: Modified independent (Device/Increase time)                Ambulation/Gait Ambulation/Gait assistance: Modified independent (Device/Increase time) Gait Distance (Feet): 150 Feet Assistive device: Rolling walker (2 wheels) Gait  Pattern/deviations: Step-through pattern, Decreased stance time - left, Decreased stride length Gait velocity: decreased     General Gait Details: No LOB noted. LLE externally rotated during   Stairs Stairs: Yes Stairs assistance: Supervision Stair Management: Two rails, Step to pattern, Forwards, Backwards Number of Stairs: 8     Wheelchair Mobility    Modified Rankin (Stroke Patients Only)       Balance Overall balance assessment: No apparent balance deficits (not formally assessed)                                          Cognition Arousal/Alertness: Awake/alert Behavior During Therapy: WFL for tasks assessed/performed Overall Cognitive Status: Within Functional Limits for tasks assessed                                 General Comments: Pt much more pleasant today        Exercises      General Comments General comments (skin integrity, edema, etc.): VSS on RA. PT in NAD      Pertinent Vitals/Pain Pain Assessment Pain Assessment: Faces Faces Pain Scale: Hurts little more Pain Location: L Knee Pain Descriptors / Indicators: Tightness, Sore Pain Intervention(s): Monitored during session    Home Living                          Prior Function            PT Goals (current goals  can now be found in the care plan section) Progress towards PT goals: Goals met/education completed, patient discharged from PT    Frequency    Other (Comment) (DCPT)      PT Plan Other (comment);Frequency needs to be updated (DCPT)    Co-evaluation              AM-PAC PT "6 Clicks" Mobility   Outcome Measure  Help needed turning from your back to your side while in a flat bed without using bedrails?: None Help needed moving from lying on your back to sitting on the side of a flat bed without using bedrails?: None Help needed moving to and from a bed to a chair (including a wheelchair)?: None Help needed standing up from  a chair using your arms (e.g., wheelchair or bedside chair)?: None Help needed to walk in hospital room?: None Help needed climbing 3-5 steps with a railing? : None 6 Click Score: 24    End of Session Equipment Utilized During Treatment: Gait belt Activity Tolerance: Patient tolerated treatment well Patient left: in bed;with call bell/phone within reach;with family/visitor present Nurse Communication: Mobility status PT Visit Diagnosis: Other abnormalities of gait and mobility (R26.89)     Time: [id number] PT Time Calculation (min) (ACUTE ONLY): 20 min  Charges:  $Gait Training: 8-22 mins                     Shelby Mattocks, PT, DPT Acute Rehab Services [id number]    Viann Shove 03/22/2022, 4:03 PM

## 2022-03-22 NOTE — Progress Notes (Signed)
Pharmacy Antibiotic Note  Edward [name] is a 82 y.o. male admitted on 03/20/2022 with cellulitis.  Pharmacy has been consulted for vancomycin dosing.  Pt w/ acute on chronic renal failure.   SCr down to 4.84 (baseline ~2.5-3), good UOP with diuresis. He is afebrile, WBC are normal, and blood cultures are ngtd. Random vancomycin level this am is 13 and therapeutic (goal 10-15 mcg/ml).  Plan: Vancomycin 1000 mg IV today Follow-up renal function and consider random level on Sat  Height: '5\' 10"'$  (177.8 cm) Weight: 108 kg (238 lb 1.6 oz) IBW/kg (Calculated) : 73  Temp (24hrs), Avg:98 F (36.7 C), Min:97.7 F (36.5 C), Max:98.4 F (36.9 C)  Recent Labs  Lab 03/20/22 1112 03/20/22 1137 03/21/22 0108 03/22/22 0044  WBC  --  14.5* 10.4 8.0  CREATININE  --  5.04* 5.11* 4.84*  LATICACIDVEN 1.7  --   --   --   VANCORANDOM  --   --   --  13     Estimated Creatinine Clearance: 15.2 mL/min (A) (by C-G formula based on SCr of 4.84 mg/dL (H)).    Allergies  Allergen Reactions   Lipitor [Atorvastatin Calcium] Other (See Comments)    Weakness/ pain in legs    Thank you for involving pharmacy in this patient's care.  Renold Genta, PharmD, BCPS Clinical Pharmacist Clinical phone for 03/22/2022 is [phone] 03/22/2022 8:27 AM

## 2022-03-22 NOTE — Consult Note (Addendum)
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 No varices on last EGD in March 2023. Has small volume ascites but this seems more likely from heart failure than decompensated cirrhosis. Cirrhosis appears compensated  at this point  - Etiology of cirrhosis is probably cardiac with high ascitic fluid protein.  - MELD 3.0: 29 (AKI driving high MELD) - INR 1.9 -not reliable on Eliquis.  - No evidence for / prior history of hepatic encephalopathy - HCC screening: No reported focal liver lesions on [place]. Normal AFP in April 2023  # Acute exacerbation of CHF.  -Getting high dose IV lasix   # AKI on CKD - Baseline creatinine around 3, up to 5 this admission.  - Getting 80 mg IV lasix BID - Getting midodrine 5 mg TID - Nephrology saw him earlier today.   # Chronic [place] anemia. Hgb stable in 9 range. 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 Up to date on colonoscopy   # Afib on Eliquis  # RLE cellulitis  PLAN  - Update AFP -  Suspect cardiac related cirrhosis but will obtain additional labs for autoimmune disease. ANA negative. Will obtain IgG, ASMA,. I cannot find results of viral hepatitis studies so will obtain.  No celiac disease on small bowel biopsies in 2015. Normal ferritin.  - [facility]. He will need community service permission from [place] to follow back up with Dr. Hilarie Fredrickson. He will get this done and make an appt with [place]. -Needs 2 gram Na restricted diet. He doesn't really monitor sodium at home.     HPI:  Patient is a 82 y.o. 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  Edward [name] was last seen in our office in 2019 to discuss polyp surveillance colonoscopy. He ended up in hospital March 2023 with IDA on Eliquis and subsequently underwent EGD / colonoscopy, see results below.   We saw patient in April 2023 for hospital consultation for findings of cirrhosis on ultrasound.  No associated portal hypertension at that time, his platelets were normal. He has a paracentesis at that time. Cytology negative. Total protein was high suggesting cardiac cirrhosis  Edward [name] presented to ED 3/5. Sent by [facility], [place] for lower extremity swelling / redness. Admitted with cellulitis. Labs remarkable for AKI on CKD, GFR 11, albumin 2.9, hgb 9 (true baseline not available but it was 12.7 in Sept 2023, platelets 114,  INR 1.9 ( not reliable on Eliqus). Abdominal US shows a nodular liver, splenomegaly and a small amount of ascites.   At home [place] hasn't had any abdominal pain. No blood in stool. He takes lasix at home. Was on Aldactone previously but this was discontinued by [facility] for unclear reasons.  He doesn't add salt but otherwise doesn't really calculate salt intake. He has recently gained 20 pounds    Previous GI History / Evaluation :   March 2023 EGD - gastritis A. STOMACH, ANTRUM, BIOPSY:  - Antral mucosa with slight chronic inflammation and reactive changes.  - No Helicobacter pylori identified.   B. STOMACH, BODY, BIOPSY:  - Oxyntic mucosa with slight chronic inflammation.  - No Helicobacter pylori identified.    March 2023 colonoscopy  - Two  2-3 mm transverse colon polyps removed but tissue only partially retrieved. Diverticulosis Path - tubular adenoma  Recent Imaging and Labs: [place] COMPLETE JOINT SPACE STRUCTURES LOW LEFT  Result Date: 03/22/2022 CLINICAL DATA:  Knee pain.  Cellulitis.  EXAM: LEFT LOWER EXTREMITY SOFT TISSUE ULTRASOUND COMPLETE TECHNIQUE: Ultrasound examination was performed including evaluation of the muscles, tendons, joint, and adjacent soft tissues. COMPARISON:  Knee radiograph March 5 24. FINDINGS: Interspersed subcutaneous edema throughout the visualized left knee. No discrete, drainable fluid collection identified. IMPRESSION: Interspersed subcutaneous edema throughout the visualized left knee. No discrete, drainable fluid collection identified. Electronically Signed   By: Margaretha Sheffield M.D.   On: 03/22/2022 10:42   US Abdomen Complete  Result Date: 03/21/2022 CLINICAL DATA:  Renal dysfunction EXAM: ABDOMEN ULTRASOUND COMPLETE COMPARISON:  11/20/2021 FINDINGS: Gallbladder: Not seen consistent with cholecystectomy Common bile duct: Diameter: 2.1 mm Liver: There is nodularity in liver surface suggesting possible cirrhosis. No focal abnormalities are seen in visualized portions of liver. Portal vein is patent on color Doppler imaging with normal direction of blood flow towards the liver. IVC: No abnormality visualized. Pancreas: Visualized portion unremarkable. Spleen: Spleen measures 16.6 cm in maximum diameter. Right Kidney: Length: 9.7 cm. Echogenicity within normal limits. No mass or hydronephrosis visualized. Left Kidney: Length: 9.6 cm. Echogenicity within normal limits. No mass or hydronephrosis visualized. Abdominal aorta: No aneurysm visualized. Other findings: Small ascites is noted adjacent to the liver and spleen. IMPRESSION: Small ascites. Nodularity in liver surface suggests cirrhosis. Splenomegaly. Status post cholecystectomy. No sonographic abnormalities are seen in the kidneys. Electronically Signed   By: Elmer Picker M.D.   On: 03/21/2022 16:57   ECHOCARDIOGRAM COMPLETE  Result Date: 03/21/2022    ECHOCARDIOGRAM REPORT   Patient Name:   Edward [name] Date of Exam: 03/21/2022 Medical Rec #:  [id number]       Height:       70.0 in Accession #:     [id number]      Weight:       243.2 lb Date of Birth:  21-Apr-1940       BSA:          2.268 m Patient Age:    41 years        BP:           127/58 mmHg Patient Gender: M               HR:           72 bpm. Exam Location:  Inpatient Procedure: 2D Echo Indications:    dyspnea  History:        Patient has prior history of Echocardiogram examinations, most                 recent 05/07/2021. Pacemaker, Arrythmias:Atrial Fibrillation and                 LBBB; Risk Factors:Hypertension and Diabetes.  Sonographer:    Harvie Junior Referring Phys: [place]  Comments: Technically difficult study due to poor echo windows. IMPRESSIONS  1. Left ventricular ejection fraction, by estimation, is 55 to 60%. The left ventricle has normal function. The left ventricle has no regional wall motion abnormalities. There is moderate concentric left ventricular hypertrophy. Left ventricular diastolic parameters are indeterminate.  2. Right ventricular systolic function is moderately reduced. The right ventricular size is normal. There is mildly elevated pulmonary artery systolic pressure. The estimated right ventricular systolic pressure is [id number] mmHg.  3. Left atrial size was mildly dilated.  4. Right atrial size was mildly dilated.  5. The mitral valve is normal in structure. Trivial mitral valve regurgitation. No evidence of mitral stenosis. Moderate mitral annular calcification.  6. The aortic valve is tricuspid. There is moderate calcification of the aortic valve. Aortic valve regurgitation is not visualized. Aortic valve sclerosis/calcification is present, without any evidence of aortic stenosis.  7. The inferior vena cava is dilated in size with <50% respiratory variability, suggesting right atrial pressure of 15 mmHg.  8. The patient appeared to be in atrial fibrillation. FINDINGS  Left Ventricle: Left ventricular ejection fraction, by estimation, is 55 to 60%. The left ventricle has normal function. The left  ventricle has no regional wall motion abnormalities. The left ventricular internal cavity size was normal in size. There is  moderate concentric left ventricular hypertrophy. Left ventricular diastolic parameters are indeterminate. Right Ventricle: The right ventricular size is normal. No increase in right ventricular wall thickness. Right ventricular systolic function is moderately reduced. There is mildly elevated pulmonary artery systolic pressure. The tricuspid regurgitant velocity is 2.47 m/s, and with an assumed right atrial pressure of 15 mmHg, the estimated right ventricular systolic pressure is [id number] mmHg. Left Atrium: Left atrial size was mildly dilated. Right Atrium: Right atrial size was mildly dilated. Pericardium: There is no evidence of pericardial effusion. Mitral Valve: The mitral valve is normal in structure. There is mild calcification of the mitral valve leaflet(s). Moderate mitral annular calcification. Trivial mitral valve regurgitation. No evidence of mitral valve stenosis. Tricuspid Valve: The tricuspid valve is normal in structure. Tricuspid valve regurgitation is trivial. Aortic Valve: The aortic valve is tricuspid. There is moderate calcification of the aortic valve. Aortic valve regurgitation is not visualized. Aortic valve sclerosis/calcification is present, without any evidence of aortic stenosis. Aortic valve mean gradient measures 6.0 mmHg. Aortic valve peak gradient measures 11.6 mmHg. Aortic valve area, by VTI measures 1.98 cm. Pulmonic Valve: The pulmonic valve was normal in structure. Pulmonic valve regurgitation is trivial. Aorta: The aortic root is normal in size and structure. Venous: The inferior vena cava is dilated in size with less than 50% respiratory variability, suggesting right atrial pressure of 15 mmHg. IAS/Shunts: No atrial level shunt detected by color flow Doppler. Additional Comments: A device lead is visualized in the right ventricle.  LEFT VENTRICLE PLAX 2D  LVIDd:         4.00 cm     Diastology LVIDs:         2.60 cm     LV e' medial:    5.08 cm/s LV PW:         1.30 cm     LV E/e' medial:  23.6 LV IVS:        1.50 cm     LV e' lateral:   6.17 cm/s LVOT diam:     2.10 cm     LV E/e' lateral: 19.4 LV SV:         65 LV  SV Index:   29 LVOT Area:     3.46 cm                             3D Volume EF: LV Volumes (MOD)           3D EF:        58 % LV vol d, MOD A2C: 90.5 ml LV EDV:       163 ml LV vol d, MOD A4C: 91.6 ml LV ESV:       68 ml LV vol s, MOD A2C: 47.0 ml LV SV:        94 ml LV vol s, MOD A4C: 41.3 ml LV SV MOD A2C:     43.5 ml LV SV MOD A4C:     91.6 ml LV SV MOD BP:      46.7 ml RIGHT VENTRICLE RV Basal diam:  3.60 cm RV Mid diam:    3.00 cm RV S prime:     4.93 cm/s LEFT ATRIUM             Index        RIGHT ATRIUM           Index LA diam:        4.30 cm 1.90 cm/m   RA Area:     24.60 cm LA Vol (A2C):   55.2 ml 24.34 ml/m  RA Volume:   82.50 ml  36.37 ml/m LA Vol (A4C):   68.1 ml 30.03 ml/m LA Biplane Vol: 62.8 ml 27.69 ml/m  AORTIC VALVE                     PULMONIC VALVE AV Area (Vmax):    1.94 cm      PV Vmax:       0.94 m/s AV Area (Vmean):   1.84 cm      PV Peak grad:  3.6 mmHg AV Area (VTI):     1.98 cm AV Vmax:           170.00 cm/s AV Vmean:          115.250 cm/s AV VTI:            0.328 m AV Peak Grad:      11.6 mmHg AV Mean Grad:      6.0 mmHg LVOT Vmax:         95.10 cm/s LVOT Vmean:        61.150 cm/s LVOT VTI:          0.188 m LVOT/AV VTI ratio: 0.57  AORTA Ao Root diam: 3.10 cm Ao Asc diam:  3.20 cm MITRAL VALVE                TRICUSPID VALVE MV Area (PHT): 4.22 cm     TR Peak grad:   24.4 mmHg MV Decel Time: 180 msec     TR Vmax:        247.00 cm/s MR Peak grad: 16.8 mmHg MR Vmax:      205.00 cm/s   SHUNTS MV E velocity: 120.00 cm/s  Systemic VTI:  0.19 m MV A velocity: 42.40 cm/s   Systemic Diam: 2.10 cm MV E/A ratio:  2.83 Dalton McleanMD Electronically signed by Franki Monte Signature Date/Time: 03/21/2022/1:31:42 PM    Final     VAS [place] LOWER EXTREMITY VENOUS (DVT)  Result Date: 03/20/2022  Lower Venous DVT Study Patient  Name:  Edward [name]  Date of Exam:   03/20/2022 Medical Rec #: [id number]        Accession #:    [id number] Date of Birth: 20-Sep-1940        Patient Gender: M Patient Age:   69 years Exam Location:  [facility] Procedure:      VAS [place] LOWER EXTREMITY VENOUS (DVT) Referring Phys: ABIGAIL HARRIS --------------------------------------------------------------------------------  Indications: Left leg swelling, warmth, redness.  Risk Factors: Remote history of left femoral-popliteal bypass. Limitations: Poor ultrasound/tissue interface and patient pain. Comparison Study: 08-02-2017 Prior lower extremity venous study was negative for                   DVT. Performing Technologist: Darlin Coco RDMS, RVT  Examination Guidelines: A complete evaluation includes B-mode imaging, spectral Doppler, color Doppler, and power Doppler as needed of all accessible portions of each vessel. Bilateral testing is considered an integral part of a complete examination. Limited examinations for reoccurring indications may be performed as noted. The reflux portion of the exam is performed with the patient in reverse Trendelenburg.  +-----+---------------+---------+-----------+----------+--------------+ RIGHTCompressibilityPhasicitySpontaneityPropertiesThrombus Aging +-----+---------------+---------+-----------+----------+--------------+ CFV  Full           Yes      Yes                                 +-----+---------------+---------+-----------+----------+--------------+   +---------+---------------+---------+-----------+----------+--------------+ LEFT     CompressibilityPhasicitySpontaneityPropertiesThrombus Aging +---------+---------------+---------+-----------+----------+--------------+ CFV      Full           Yes      Yes                                  +---------+---------------+---------+-----------+----------+--------------+ SFJ      Full                                                        +---------+---------------+---------+-----------+----------+--------------+ FV Prox  Full                                                        +---------+---------------+---------+-----------+----------+--------------+ FV Mid   Full                                                        +---------+---------------+---------+-----------+----------+--------------+ FV DistalFull                                                        +---------+---------------+---------+-----------+----------+--------------+ PFV      Full                                                        +---------+---------------+---------+-----------+----------+--------------+  POP      Full           Yes      Yes                                 +---------+---------------+---------+-----------+----------+--------------+ PTV      Full                                                        +---------+---------------+---------+-----------+----------+--------------+ PERO     Full                                                        +---------+---------------+---------+-----------+----------+--------------+    Summary: RIGHT: - No evidence of common femoral vein obstruction.  LEFT: - There is no evidence of deep vein thrombosis in the lower extremity.  - No cystic structure found in the popliteal fossa. - Ultrasound characteristics of enlarged lymph nodes noted in the groin.  *See table(s) above for measurements and observations. Electronically signed by Harold Barban MD on 03/20/2022 at 10:10:42 PM.    Final    DG Knee 1-2 Views Left  Result Date: 03/20/2022 CLINICAL DATA:  Left knee edema and redness. EXAM: LEFT KNEE - 1-2 VIEW COMPARISON:  None Available. 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 The arthroplasty components appear intact  and in anatomic alignment. There is no acute fracture or dislocation. The bones are osteopenic. No joint effusion. Vascular calcifications and multiple surgical clips. There is diffuse subcutaneous edema. IMPRESSION: 1. No acute fracture or dislocation. 2. Arthroplasty appears intact. 3. Diffuse subcutaneous edema. Electronically Signed   By: Anner Crete M.D.   On: 03/20/2022 17:17   DG Chest Port 1 View  Result Date: 03/20/2022 CLINICAL DATA:  Distention, history CHF EXAM: PORTABLE CHEST 1 VIEW COMPARISON:  05/06/2021 FINDINGS: Cardiac silhouette grossly enlarged. Pulmonary vascular congestion without focal consolidation. Left-sided pacer. Calcified aorta. Median sternotomy wires. Left atrial clip. IMPRESSION: Enlarged cardiac silhouette.  Pulmonary vascular congestion. Electronically Signed   By: Sammie Bench M.D.   On: 03/20/2022 11:30      Labs:  Recent Labs    03/20/22 1137 03/21/22 0108 03/22/22 0044  WBC 14.5* 10.4 8.0  HGB 9.7* 9.1* 9.0*  HCT 33.4* 31.5* 28.7*  PLT 104* 106* 114*   Recent Labs    03/20/22 1137 03/21/22 0108 03/22/22 0044  NA 135 134* 133*  K 4.3 4.6 4.2  CL 102 100 101  CO2 22 21* 22  GLUCOSE 127* 132* 156*  BUN 73* 82* 83*  CREATININE 5.04* 5.11* 4.84*  CALCIUM 8.6* 8.4* 8.3*   Recent Labs    03/21/22 0108  PROT 6.7  ALBUMIN 2.9*  AST 23  ALT 12  ALKPHOS 102  BILITOT 1.6*   No results for input(s): "HEPBSAG", "HCVAB", "HEPAIGM", "HEPBIGM" in the last 72 hours. Recent Labs    03/21/22 0108  LABPROT 21.9*  INR 1.9*    Past Medical History:  Diagnosis Date   Anemia    ?   Carotid artery occlusion    Chronic diastolic CHF (congestive heart failure) (  [place]) 04/23/2012   CKD (chronic kidney disease), stage III (HCC)    Claudication ([place])    Coronary artery disease    s/p CABG 2013   Diabetic neuropathy (HCC)    mostly feet/ legs   Diverticulosis    Dyslipidemia    Exertional shortness of breath    GERD (gastroesophageal  reflux disease)    GI bleed    while on triple anticoag therapy   HTN (hypertension)    Pacemaker    PAF (paroxysmal atrial fibrillation) ([place])    s/p MAZE at time of CABG   PVD (peripheral vascular disease) ([place])    R CEA 2011, prior PTA, left fem-pop 2014   Stroke ([place])    vs TIA   Tubular adenoma of colon    Type II diabetes mellitus ([place])     Past Surgical History:  Procedure Laterality Date   ABDOMINAL AORTAGRAM N/A 01/07/2012   Procedure: ABDOMINAL Maxcine Ham;  Surgeon: Angelia Mould, MD;  Location: [facility] CATH LAB;  Service: Cardiovascular;  Laterality: N/A;   BALLOON DILATION N/A 02/17/2013   Procedure: BALLOON DILATION;  Surgeon: Jerene Bears, MD;  Location: [facility] ENDOSCOPY;  Service: Gastroenterology;  Laterality: N/A;   BIOPSY  03/17/2021   Procedure: BIOPSY;  Surgeon: [name], MD;  Location: [place];  Service: Gastroenterology;;   CAROTID ENDARTERECTOMY Right 03/2009   CATARACT EXTRACTION Bilateral    CHOLECYSTECTOMY     CIRCUMCISION     COLONOSCOPY WITH PROPOFOL N/A 02/17/2013   Procedure: COLONOSCOPY WITH PROPOFOL;  Surgeon: Jerene Bears, MD;  Location: [facility] ENDOSCOPY;  Service: Gastroenterology;  Laterality: N/A;   COLONOSCOPY WITH PROPOFOL N/A 03/18/2021   Procedure: COLONOSCOPY WITH PROPOFOL;  Surgeon: Carol Ada, MD;  Location: [name];  Service: Gastroenterology;  Laterality: N/A;   CORONARY ARTERY BYPASS GRAFT  06/14/2011   Procedure: CORONARY ARTERY BYPASS GRAFTING (CABG);  Surgeon: Gaye Pollack, MD;  Location: [place];  Service: Open Heart Surgery;  Laterality: N/A;  Coronary Artery Bypass Graft on pump times six;  utilizing internal mammary artery and right greater saphenous vein harvested endoscopically.x 5 vessels   ELECTROPHYSIOLOGY STUDY N/A 11/05/2012   Procedure: ELECTROPHYSIOLOGY STUDY;  Surgeon: Deboraha Sprang, MD;  Location: [facility] CATH LAB;  Service: Cardiovascular;  Laterality: N/A;   ESOPHAGOGASTRODUODENOSCOPY (EGD) WITH PROPOFOL N/A 02/17/2013    Procedure: ESOPHAGOGASTRODUODENOSCOPY (EGD) WITH PROPOFOL;  Surgeon: Jerene Bears, MD;  Location: [facility]OS[facility];  Service: Gastroenterology;  Laterality: N/A;   ESOPHAGOGASTRODUODENOSCOPY (EGD) WITH PROPOFOL N/A 03/17/2021   Procedure: ESOPHAGOGASTRODUODENOSCOPY (EGD) WITH PROPOFOL;  Surgeon: [name], MD;  Location: [place];  Service: Gastroenterology;  Laterality: N/A;   FEMORAL-POPLITEAL BYPASS GRAFT Left 03/06/2012   Procedure: BYPASS GRAFT FEMORAL-POPLITEAL ARTERY;  Surgeon: Angelia Mould, MD;  Location: [place];  Service: Vascular;  Laterality: Left;  Left Femoral - Below Knee Popliteal Bypass Graft with Vein and Intraoperative Arteriogram.   JOINT REPLACEMENT Left    left TKA   KNEE ARTHROSCOPY Left    "I had 3" (11/05/2012)   LEAD REVISION  11/05/2012   pacemaker/notes 11/05/2012   LEAD REVISION N/A 11/05/2012   Procedure: LEAD REVISION;  Surgeon: Deboraha Sprang, MD;  Location: [facility] CATH LAB;  Service: Cardiovascular;  Laterality: N/A;   MAZE  06/14/2011   Procedure: MAZE;  Surgeon: Gaye Pollack, MD;  Location: [place];  Service: Open Heart Surgery;  Laterality: N/A;  [place] left atrial appendage   PERMANENT PACEMAKER INSERTION N/A 06/20/2011  Procedure: PERMANENT PACEMAKER INSERTION;  Surgeon: Deboraha Sprang, MD;  Location: [facility] CATH LAB;  Service: Cardiovascular;  Laterality: N/A;   POLYPECTOMY  03/18/2021   Procedure: POLYPECTOMY;  Surgeon: Carol Ada, MD;  Location: [facility] ENDOSCOPY;  Service: Gastroenterology;;   TONSILLECTOMY AND ADENOIDECTOMY     TOTAL KNEE ARTHROPLASTY Left     Family History  Problem Relation Age of Onset   Coronary artery disease Father    Hypertension Father    Heart disease Father        Heart Disease before age 40   Heart attack Mother    Coronary artery disease Mother    Deep vein thrombosis Mother    Heart disease Mother        Heart Disease before age 21   Hyperlipidemia Mother    Hypertension Mother    Hypertension Sister    Heart  disease Sister        Heart Disease before age 42   Hyperlipidemia Sister    Heart disease Brother        Heart Disease before age 31   Colon cancer Brother 36   Anesthesia problems Neg Hx    Diabetes Neg Hx     Prior to Admission medications   Medication Sig Start Date End Date Taking? Authorizing Provider  acetaminophen (TYLENOL) 650 MG CR tablet Take 650 mg by mouth every 8 (eight) hours as needed for pain.   Yes [provider]  allopurinol (ZYLOPRIM) 100 MG tablet Take 50 mg by mouth daily.   Yes [provider]  apixaban (ELIQUIS) 2.5 MG TABS tablet Take 2.5 mg by mouth 2 (two) times daily.   Yes [provider]  ascorbic acid (VITAMIN C) 500 MG tablet Take 1,000 mg by mouth every Monday, Wednesday, and Friday.   Yes [provider]  carvedilol (COREG) 6.25 MG tablet Take 1 tablet (6.25 mg total) by mouth 2 (two) times daily. 02/24/19  Yes Nahser, Wonda Cheng, MD  cetirizine (ZYRTEC) 10 MG tablet Take 10 mg by mouth daily.   Yes [provider]  cholecalciferol (VITAMIN D3) 25 MCG (1000 UNIT) tablet Take 2,000 Units by mouth daily.   Yes [provider]  cyanocobalamin 1000 MCG tablet Take 1,000 mcg by mouth daily.   Yes [provider]  ferrous sulfate 325 (65 FE) MG tablet Take 650 mg by mouth every Monday, Wednesday, and Friday.   Yes [provider]  furosemide (LASIX) 40 MG tablet TAKE 2 TABLETS BY MOUTH EVERY MORNING AND TAKE 1 TABLET IN THE AFTERNOON Patient taking differently: Take 60-80 mg by mouth See admin instructions. Take 4 tablets by mouth in the morning and 3 tablets in the evening 03/18/18  Yes Nahser, Wonda Cheng, MD  gabapentin (NEURONTIN) 100 MG capsule Take 200-300 mg by mouth See admin instructions. Take 2 capsules by mouth in the morning and 3 capsules in the evening   Yes [provider]  HYDROcodone-acetaminophen (NORCO/VICODIN) 5-325 MG tablet Take 1 tablet by mouth 2 (two) times daily as  needed for moderate pain.   Yes [provider]  insulin glargine (LANTUS) 100 UNIT/ML injection Inject 48 Units into the skin in the morning.   Yes [provider]  isosorbide mononitrate (IMDUR) 30 MG 24 hr tablet Take 30 mg by mouth daily.   Yes [provider]  levothyroxine (SYNTHROID) 25 MCG tablet Take 25 mcg by mouth daily before breakfast.   Yes [provider]  Magnesium Oxide 420  MG TABS Take 2 tablets by mouth at bedtime.   Yes [provider]  Multiple Vitamin (MULTIVITAMIN) capsule Take 1 capsule by mouth daily.   Yes [provider]  pantoprazole (PROTONIX) 40 MG tablet Take 40 mg by mouth daily.   Yes [provider]  Semaglutide, 1 MG/DOSE, 2 MG/1.5ML SOPN Inject 1 mg into the skin every Sunday.   Yes [provider]  testosterone cypionate (DEPOTESTOSTERONE CYPIONATE) 200 MG/ML injection Inject 200 mg into the muscle every 14 (fourteen) days. Given every other Sunday   Yes [provider]  valsartan (DIOVAN) 80 MG tablet Take 80 mg by mouth daily.   Yes [provider]  albuterol (PROVENTIL HFA;VENTOLIN HFA) 108 (90 Base) MCG/ACT inhaler USE 2 PUFFS EVERY 4 HOURS AS NEEDED FOR WHEEZING Patient not taking: Reported on 03/20/2022 11/11/17   Nahser, Wonda Cheng, MD  blood glucose meter kit and supplies Dispense based on patient and insurance preference. Use up to four times daily as directed. (FOR ICD-10 E10.9, E11.9). 02/18/17   Scot Jun, NP  potassium chloride (KLOR-CON M) 10 MEQ tablet Take 10 mEq by mouth daily. Patient not taking: Reported on 03/20/2022    [provider]  spironolactone (ALDACTONE) 25 MG tablet Take 1 tablet (25 mg total) by mouth daily. Patient not taking: Reported on 03/20/2022 05/10/21   Dwyane Dee, MD  Zinc 50 MG TABS Take 50 mg by mouth daily. Patient not taking: Reported on 03/20/2022    [provider]    Current Facility-Administered  Medications  Medication Dose Route Frequency Provider Last Rate Last Admin   acetaminophen (TYLENOL) tablet 650 mg  650 mg Oral Q8H PRN Salvadore Oxford, MD       albuterol (PROVENTIL) (2.5 MG/3ML) 0.083% nebulizer solution 2.5 mg  2.5 mg Nebulization Q6H PRN Kinnie Feil, MD       allopurinol (ZYLOPRIM) tablet 50 mg  50 mg Oral Once per day on Mon Thu Quillen, Legrand Como, MD   50 mg at 03/22/22 [id number]   apixaban (ELIQUIS) tablet 2.5 mg  2.5 mg Oral BID Salvadore Oxford, MD   2.5 mg at 03/22/22 0818   carvedilol (COREG) tablet 6.25 mg  6.25 mg Oral BID WC Salvadore Oxford, MD   6.25 mg at 03/22/22 [id number]   cefTRIAXone (ROCEPHIN) 2 g in sodium chloride 0.9 % 100 mL IVPB  2 g Intravenous Daily Jim Like B, MD 200 mL/hr at 03/22/22 0825 2 g at 03/22/22 0825   cholecalciferol (VITAMIN D3) 25 MCG (1000 UNIT) tablet 1,000 Units  1,000 Units Oral Daily Salvadore Oxford, MD   1,000 Units at 03/22/22 [id number]   cyanocobalamin (VITAMIN B12) tablet 1,000 mcg  1,000 mcg Oral Daily Andrena Mews T, MD   1,000 mcg at 03/22/22 0818   furosemide (LASIX) injection 80 mg  80 mg Intravenous BID Madelon Lips, MD   80 mg at 03/22/22 0818   gabapentin (NEURONTIN) capsule 100 mg  100 mg Oral BID Salvadore Oxford, MD   100 mg at 03/22/22 0819   insulin aspart (novoLOG) injection 0-6 Units  0-6 Units Subcutaneous TID WC Salvadore Oxford, MD   2 Units at 03/22/22 1122   levothyroxine (SYNTHROID) tablet 25 mcg  25 mcg Oral Q0600 Salvadore Oxford, MD   25 mcg at 03/22/22 0613   midodrine (PROAMATINE) tablet 5 mg  5 mg Oral TID WC Madelon Lips, MD   5 mg at 03/22/22 1001   pantoprazole (PROTONIX) EC tablet 40 mg  40  mg Oral BID Salvadore Oxford, MD   40 mg at 03/22/22 [id number]   polyvinyl alcohol (LIQUIFILM TEARS) 1.4 % ophthalmic solution 1 drop  1 drop Both Eyes PRN Darci Current, DO   1 drop at 03/22/22 [id number]   vancomycin variable dose per unstable renal function (pharmacist dosing)   Does not apply See admin  instructions Laren Everts, RPH       zinc sulfate capsule 220 mg  220 mg Oral Daily Andrena Mews T, MD   220 mg at 03/22/22 [id number]    Allergies as of 03/20/2022 - Review Complete 03/20/2022  Allergen Reaction Noted   Lipitor [atorvastatin calcium] Other (See Comments) 05/30/2010    Social History   Socioeconomic History   Marital status: Married    Spouse name: Not on file   Number of children: Not on file   Years of education: Not on file   Highest education level: Not on file  Occupational History   Occupation: retired  Tobacco Use   Smoking status: Former    Packs/day: 3.00    Years: 32.00    Total pack years: 96.00    Types: Cigarettes    Quit date: 01/16/1979    Years since quitting: 43.2   Smokeless tobacco: Never  Vaping Use   Vaping Use: Never used  Substance and Sexual Activity   Alcohol use: Yes    Alcohol/week: 2.0 standard drinks of alcohol    Types: 2 Glasses of wine per week    Comment: 1 glass of champagne with dinner, occasional beer   Drug use: No   Sexual activity: Not Currently  Other Topics Concern   Not on file  Social History Narrative   Not on file   Social Determinants of Health   Financial Resource Strain: Not on file  Food Insecurity: Not on file  Transportation Needs: Not on file  Physical Activity: Not on file  Stress: Not on file  Social Connections: Not on file  Intimate Partner Violence: Not on file    Review of Systems: All systems reviewed and negative except where noted in HPI.  Physical Exam: Vital signs in last 24 hours: Temp:  [97.4 F (36.3 C)-98.4 F (36.9 C)] 97.4 F (36.3 C) (03/07 1106) Pulse Rate:  [63-72] 63 (03/07 1106) Resp:  [18-20] 20 (03/07 1106) BP: (111-135)/(56-73) 122/56 (03/07 1106) SpO2:  [97 %-100 %] 99 % (03/07 1106) Weight:  [id number] kg] 108 kg (03/07 0514)    General:  Alert male in NAD Psych:  Pleasant, cooperative. Normal mood and affect Eyes: Pupils equal Ears:  Normal auditory  acuity Nose: No deformity, discharge or lesions Neck:  Supple, no masses felt Lungs:  Clear to auscultation.  Heart:  Regular rate, regular rhythm.  Abdomen:  Soft, nondistended, nontender, active bowel sounds, no masses felt Rectal :  Deferred Msk: Symmetrical without gross deformities.  Neurologic:  Alert, oriented, grossly normal neurologically Extremities : BLE  edema. RLE erythematous.     Intake/Output from previous day: 03/06 0701 - 03/07 0700 In: 750 [P.O.:750] Out: 3075 [Urine:3075] Intake/Output this shift:  Total I/O In: 480 [P.O.:480] Out: 920 [Urine:920]    Principal Problem:   Acute exacerbation of CHF (congestive heart failure) (HCC) Active Problems:   PAF (paroxysmal atrial fibrillation) (HCC)   Diabetes mellitus (HCC)   HTN (hypertension)   Acute-on-chronic kidney injury ([place])   Cirrhosis of liver (HCC)   Cellulitis   Acute diastolic congestive heart failure (HCC)   Diastolic congestive heart failure (  [place])    Tye Savoy, NP-C @  03/22/2022, 12:15 PM

## 2022-03-22 NOTE — Progress Notes (Signed)
Daily Progress Note Intern Pager: [phone]  Patient name: Edward [name] Medical record number: [id number] Date of birth: [birth year] Age: 82 y.o. Gender: male  Primary Care Provider: [facility] Consultants: Nephrology Code Status: Full Code  Pt Overview and Major Events to Date:  -Admitted 3/5  Assessment and Plan: Edward [name] is a 82 y.o. admitted for acute heart failure exacerbation in the setting of chronic kidney disease and concomitant left lower extremity cellulitis. Overall stable, ongoing diuresis antibiotic treatment. Pertinent PMHx includes CAD, s/p CABG, a-fib on eliquis, cirrhosis, CKD4, COPD, CHF. Hx of tubular adenoma of the colon, PAD, and diabetes.   * Acute exacerbation of CHF (congestive heart failure) ([place]) Respiratory status continues to be stable. Still grossly volume overloaded. ECHO demonstrating no significant changes from prior. UOP -3.075L. Diuresis per nephrology. -Strict Is/Os -Daily Weight -IV Lasix '80mg'$  BID -PT/OT Eval -Daily Lab: CMP, CBC -Cardiorespiratory Monitoring     Cellulitis Exam unchanged from prior. Possible abscess around posterior medial knee. Will evaluate with ultrasound for possible I&D. Reassuring continues to be afebrile and WBC down-trending. -Continue Vancomycin and Ceftriaxone -Monitor fever -Bcxs NGTD 24hrs -Ultrasound knee ordered -Consider MRI  Acute-on-chronic kidney injury ([place]) Creatinine increased to 4.84 from 5.11. BUN 83.  -Nephrology consulted, recs appreciated -IV diuresis as below monitor kidney function -AM CMP -Hold home Valsartan  Cirrhosis of liver (HCC) RUQ demonstrating small ascites.  -GI consulted, recs appreciated  Diabetes mellitus ([place]) Last A1c 6.2.  Home insulin 48 units Lantus daily, Semaglutide. Stable glucoses overnight. -Very sensitive sliding scale -CBGs with meals and at night  HTN (hypertension) BP mildly improved from admission.  Hold home Imdur.  Hold home  valsartan. -Stop spironolactone -Continue Coreg -Start Midodrine '5mg'$  TID    FEN/GI: Hearth healthy/Carb modified PPx: Lovenox Dispo:Pending clinical improvemen  Subjective:  NAEO. Wife at bedside. States swelling is much better than when he came in. Patient states he is urinating a lot.   Objective: Temp:  [97.4 F (36.3 C)-98.4 F (36.9 C)] 97.4 F (36.3 C) (03/07 1106) Pulse Rate:  [63-72] 63 (03/07 1106) Resp:  [18-20] 20 (03/07 1106) BP: (111-135)/(56-73) 122/56 (03/07 1106) SpO2:  [97 %-100 %] 99 % (03/07 1106) Weight:  [id number] kg] 108 kg (03/07 0514) Physical Exam: General: NAD, lying comfortably in hospital bed Neuro: A&O Cardiovascular: RRR, no murmurs, 2+ pitting edema bilaterally worse on left Respiratory: normal WOB on RA, decreased crackles from exam prior Abdomen: soft, protuberant, NTTP, no rebound or guarding Extremities: Moving all 4 extremities equally, left leg erythema and edema unchanged from prior, medial posterior popliteal area of induration, no palpable abscess   Laboratory: Most recent CBC Lab Results  Component Value Date   WBC 8.0 03/22/2022   HGB 9.0 (L) 03/22/2022   HCT 28.7 (L) 03/22/2022   MCV 84.9 03/22/2022   PLT 114 (L) 03/22/2022   Most recent BMP    Latest Ref Rng & Units 03/22/2022   12:44 AM  BMP  Glucose 70 - 99 mg/dL 156   BUN 8 - 23 mg/dL 83   Creatinine 0.61 - 1.24 mg/dL 4.84   Sodium 135 - 145 mmol/L 133   Potassium 3.5 - 5.1 mmol/L 4.2   Chloride 98 - 111 mmol/L 101   CO2 22 - 32 mmol/L 22   Calcium 8.9 - 10.3 mg/dL 8.3     Other pertinent labs: Mag 2.1, Glu - 150<156<218  Imaging/Diagnostic Tests:  US Abdomen 03/21/22 Small ascites. Nodularity in liver  surface suggests cirrhosis. Splenomegaly.   Status post cholecystectomy. No sonographic abnormalities are seen in the kidneys.  Salvadore Oxford, MD 03/22/2022, 11:13 AM  PGY-1, [place] Intern pager: [phone], text pages  welcome Secure chat group [place]

## 2022-03-22 NOTE — Assessment & Plan Note (Addendum)
RUQ demonstrating small ascites. GI recommend outpatient follow-up, and unclear cause of ascites, given lack of access -Follow-up autoimmune labs: Anti-smooth muscle, IgG,  -Follow-up Hepatitis labs redraw

## 2022-03-23 DIAGNOSIS — E8770 Fluid overload, unspecified: Secondary | ICD-10-CM | POA: Diagnosis not present

## 2022-03-23 DIAGNOSIS — N179 Acute kidney failure, unspecified: Secondary | ICD-10-CM | POA: Diagnosis not present

## 2022-03-23 DIAGNOSIS — I5033 Acute on chronic diastolic (congestive) heart failure: Secondary | ICD-10-CM | POA: Diagnosis not present

## 2022-03-23 DIAGNOSIS — L03116 Cellulitis of left lower limb: Secondary | ICD-10-CM | POA: Diagnosis not present

## 2022-03-23 LAB — CBC
HCT: 31.2 % — ABNORMAL LOW (ref 39.0–52.0)
Hemoglobin: 9.4 g/dL — ABNORMAL LOW (ref 13.0–17.0)
MCH: 25.6 pg — ABNORMAL LOW (ref 26.0–34.0)
MCHC: 30.1 g/dL (ref 30.0–36.0)
MCV: 85 fL (ref 80.0–100.0)
Platelets: 123 10*3/uL — ABNORMAL LOW (ref 150–400)
RBC: 3.67 MIL/uL — ABNORMAL LOW (ref 4.22–5.81)
RDW: 17.9 % — ABNORMAL HIGH (ref 11.5–15.5)
WBC: 6.7 10*3/uL (ref 4.0–10.5)
nRBC: 0 % (ref 0.0–0.2)

## 2022-03-23 LAB — HEPATITIS B SURFACE ANTIGEN: Hepatitis B Surface Ag: NONREACTIVE

## 2022-03-23 LAB — GLUCOSE, CAPILLARY
Glucose-Capillary: 122 mg/dL — ABNORMAL HIGH (ref 70–99)
Glucose-Capillary: 169 mg/dL — ABNORMAL HIGH (ref 70–99)
Glucose-Capillary: 156 mg/dL — ABNORMAL HIGH (ref 70–99)
Glucose-Capillary: 155 mg/dL — ABNORMAL HIGH (ref 70–99)

## 2022-03-23 LAB — BASIC METABOLIC PANEL
Anion gap: 9 (ref 5–15)
BUN: 81 mg/dL — ABNORMAL HIGH (ref 8–23)
CO2: 24 mmol/L (ref 22–32)
Calcium: 8.5 mg/dL — ABNORMAL LOW (ref 8.9–10.3)
Chloride: 101 mmol/L (ref 98–111)
Creatinine, Ser: 4.45 mg/dL — ABNORMAL HIGH (ref 0.61–1.24)
GFR, Estimated: 13 mL/min — ABNORMAL LOW (ref >60)
Glucose, Bld: 117 mg/dL — ABNORMAL HIGH (ref 70–99)
Potassium: 4.3 mmol/L (ref 3.5–5.1)
Sodium: 134 mmol/L — ABNORMAL LOW (ref 135–145)

## 2022-03-23 LAB — ANTI-SMOOTH MUSCLE ANTIBODY, IGG: F-Actin IgG: 9 Units (ref 0–19)

## 2022-03-23 LAB — MAGNESIUM: Magnesium: 2 mg/dL (ref 1.7–2.4)

## 2022-03-23 NOTE — Progress Notes (Signed)
Daily Progress Note Intern Pager: [phone]  Patient name: Edward [name] Medical record number: [id number] Date of birth: [birth year] Age: 82 y.o. Gender: male  Primary Care Provider: [facility] Consultants: Nephrology Code Status: Full Code  Pt Overview and Major Events to Date:  -Admitted 3/5  Assessment and Plan: Edward [name] is a 82 y.o. admitted for acute heart failure exacerbation in the setting of chronic kidney disease and concomitant left lower extremity cellulitis. Overall stable, ongoing diuresis antibiotic treatment. Pertinent PMHx includes CAD, s/p CABG, a-fib on eliquis, cirrhosis, CKD4, COPD, CHF. Hx of tubular adenoma of the colon, PAD, and diabetes.   * Acute exacerbation of CHF (congestive heart failure) ([place]) Respiratory status continues to be stable. Significantly improved edema. UOP -4.45L. Weight 105.9kg (108Kg). Diuresis per nephrology. -Strict Is/Os -Daily Weight -IV Lasix '80mg'$  BID -PT/OT Eval -Daily Lab: CMP, CBC, Mag -Cardiorespiratory Monitoring     Cellulitis Exam improved. Ultrasound did not show drainable fluid collection. Reassuring continues to be afebrile and WBC down-trending. -Continue Vancomycin through today -Stop Ceftriaxone -Likely transition to oral abx tomorrow -Monitor fever -Bcxs NGTD 72hrs -Consider MRI  AKI (acute kidney injury) ([place]) Creatinine decreased to 4.45 from 4.84. Improving. -Nephrology consulted, recs appreciated -IV diuresis as below monitor kidney function -AM CMP -Hold home Valsartan  Cirrhosis of liver (HCC) RUQ demonstrating small ascites. GI recommend outpatient follow-up, and unclear cause of ascites, given lack of access -Follow-up autoimmune labs: Anti-smooth muscle, IgG,  -Follow-up Hepatitis labs redraw  Diabetes mellitus ([place]) Last A1c 6.2.  Home insulin 48 units Lantus daily, Semaglutide. Stable glucoses overnight. -Very sensitive sliding scale -CBGs with meals and at  night  HTN (hypertension) BP mildly improved from admission.  Hold home Imdur.  Hold home valsartan. -Stop spironolactone -Continue Coreg -Continue Midodrine '5mg'$  TID   FEN/GI: Hearth healthy/Carb modified PPx: Lovenox Dispo:Pending clinical improvement  Subjective:  NAEO. States his cellulitis is in less pain and that his swelling is greatly improved.  Objective: Temp:  [97.3 F (36.3 C)-97.9 F (36.6 C)] 97.9 F (36.6 C) (03/08 1103) Pulse Rate:  [61-74] 66 (03/08 1103) Resp:  [17-18] 18 (03/08 1103) BP: (110-144)/(55-72) 144/68 (03/08 1103) SpO2:  [99 %-100 %] 99 % (03/08 1103) Weight:  [105.9 kg-112 kg] 105.9 kg (03/08 0513) Physical Exam: General: NAD Cardiovascular: RRR, no murmurs, improved peripheral edema bilateral lower extremities Respiratory: normal WOB on RA, CTAB, crackles at lung bases Abdomen: soft, protuberant, NTTP, no rebound or guarding Extremities: Moving all 4 extremities equally, cellulitic rash less tender to palpation then previous, decreased erythema   Laboratory: Most recent CBC Lab Results  Component Value Date   WBC 6.7 03/23/2022   HGB 9.4 (L) 03/23/2022   HCT 31.2 (L) 03/23/2022   MCV 85.0 03/23/2022   PLT 123 (L) 03/23/2022   Most recent BMP    Latest Ref Rng & Units 03/23/2022   12:36 AM  BMP  Glucose 70 - 99 mg/dL 117   BUN 8 - 23 mg/dL 81   Creatinine 0.61 - 1.24 mg/dL 4.45   Sodium 135 - 145 mmol/L 134   Potassium 3.5 - 5.1 mmol/L 4.3   Chloride 98 - 111 mmol/L 101   CO2 22 - 32 mmol/L 24   Calcium 8.9 - 10.3 mg/dL 8.5     Other pertinent labs: Glu 122<117   Imaging/Diagnostic Tests:  [place] Left Knee Interspersed subcutaneous edema throughout the visualized left knee. No discrete, drainable fluid collection identified.  Edward Oxford, MD  03/23/2022, 12:31 PM  PGY-1, [place] Intern pager: [phone], text pages welcome Secure chat group [place]

## 2022-03-23 NOTE — Progress Notes (Incomplete)
   Daily Progress Note Intern Pager: [phone]  Patient name: Edward [name] Medical record number: [id number] Date of birth: 14-Nov-1940 Age: 82 y.o. Gender: male  Primary Care Provider: [facility] Consultants: Nephrology Code Status: FULL  Pt Overview and Major Events to Date:  Admitted 3/5   Assessment and Plan: Edward [name] is a 82 y.o. admitted for acute heart failure exacerbation in the setting of chronic kidney disease and concomitant left lower extremity cellulitis. Overall stable, ongoing diuresis, antibiotic treatment. Pertinent PMHx includes CAD, s/p CABG, Afib on eliquis, cirrhosis, CKD4, COPD, CHF. Hx of tubular adenoma of the colon, PAD, and diabetes.    * Acute on chronic diastolic heart failure (HCC) Respiratory status continues to be stable. Continued edema, but significantly improved from prior. UOP -2.8L. Weight decreased 105.9>104.8kg. Diuresis per nephrology. -Strict Is/Os -Daily Weight -IV Lasix 80mg  BID -PT/OT Eval -AM BMP -Cardiorespiratory Monitoring     Cellulitis Exam improved. Ultrasound did not show drainable fluid collection. Reassuring continues to be afebrile and WBC down-trending. S/p ceftriaxone. Bcx NGTD 72 hours.  -continue vancomycin  -Monitor fever -Consider MRI  AKI (acute kidney injury) ([place]) Creatinine decreased to 4.13 from 4.45. Continues improving. -Nephrology consulted, recs appreciated -IV diuresis as below monitor kidney function -AM CMP -Hold home Valsartan  Cirrhosis of liver (HCC) RUQ demonstrating small ascites. GI recommend outpatient follow-up, and unclear cause of ascites, given lack of access -Follow-up autoimmune labs: Anti-smooth muscle, IgG,  -Follow-up Hepatitis labs redraw  Diabetes mellitus ([place]) Last A1c 6.2.  Home insulin 48 units Lantus daily, Semaglutide. Slightly hyperglycemic (150s) overnight.  -Very sensitive sliding scale -CBGs with meals and at night  HTN (hypertension) BP mildly  improved from admission.  Hold home Imdur.  Hold home valsartan. -Stop spironolactone -Continue Coreg -Continue Midodrine 5mg  TID   FEN/GI: Hearth healthy/Carb modified PPx: Lovenox Dispo: Pending clinical improvement  Subjective:  NAEO. Lying on side in bed. Denies chest pain, SOB. Reports continued urine output overnight.   Objective: Temp:  [97.8 F (36.6 C)-97.9 F (36.6 C)] 97.9 F (36.6 C) (03/09 0605) Pulse Rate:  [63-77] 63 (03/09 0605) Resp:  [18] 18 (03/09 0605) BP: (106-144)/(49-77) 110/71 (03/09 0605) SpO2:  [95 %-100 %] 98 % (03/09 0605) Weight:  [104.8 kg] 104.8 kg (03/09 2979) Physical Exam: General: in no acute distress Cardiovascular: regular rate and regular rhythm.   Respiratory: normal WOB on RA. CTAB.  Abdomen: protuberant, soft, BS present.  Extremities: erythema of LLE. BLE pitting edema, L > R.  Laboratory: Most recent CBC Lab Results  Component Value Date   WBC 6.7 03/23/2022   HGB 9.4 (L) 03/23/2022   HCT 31.2 (L) 03/23/2022   MCV 85.0 03/23/2022   PLT 123 (L) 03/23/2022   Most recent BMP    Latest Ref Rng & Units 03/24/2022   12:54 AM  BMP  Glucose 70 - 99 mg/dL 148   BUN 8 - 23 mg/dL 77   Creatinine 0.61 - 1.24 mg/dL 4.13   Sodium 135 - 145 mmol/L 134   Potassium 3.5 - 5.1 mmol/L 4.2   Chloride 98 - 111 mmol/L 98   CO2 22 - 32 mmol/L 27   Calcium 8.9 - 10.3 mg/dL 8.6    Other pertinent labs Glu 155 > 148.   Rolanda Lundborg, MD 03/24/2022, 6:44 AM  PGY-1, [place] Intern pager: [phone], text pages welcome Secure chat group [place]

## 2022-03-23 NOTE — Progress Notes (Signed)
Occupational Therapy Treatment Patient Details Name: Edward [name] MRN: [id number] DOB: 02/19/1940 Today's Date: 03/23/2022   History of present illness Pt is a 82 yo male admitted with swelling in LLE with redness and pain. Neg for DVT PMH:CKD, CVA, CAD, DM, HTN, PAF, LTKA   OT comments  Patient continues to make steady progress towards goals in skilled OT session. Patient's session encompassed AE education, further assessment of lower body ADL management, and overall education with regard to safe discharge home. Patient receptive to education with regard to AE, however declining to attempt or practice, stating that his wife would help him in the meantime. Patient with noted swelling in RUE, with education and strategies provided to decrease swelling and promote drainage. Patient declining OOB, however has been up to bathroom independently twice this morning. OT will continue to follow actuely, with recommendation remaining appropriate.     Recommendations for follow up therapy are one component of a multi-disciplinary discharge planning process, led by the attending physician.  Recommendations may be updated based on patient status, additional functional criteria and insurance authorization.    Follow Up Recommendations  No OT follow up     Assistance Recommended at Discharge PRN  Patient can return home with the following  A little help with bathing/dressing/bathroom;Assistance with cooking/housework   Equipment Recommendations  None recommended by OT    Recommendations for Other Services      Precautions / Restrictions Precautions Precautions: Fall Restrictions Weight Bearing Restrictions: No       Mobility Bed Mobility Overal bed mobility: Modified Independent                  Transfers Overall transfer level: Modified independent                 General transfer comment: Patient declining OOB, however has been up to bathroom independently x2 this AM      Balance Overall balance assessment: No apparent balance deficits (not formally assessed)                                         ADL either performed or assessed with clinical judgement   ADL Overall ADL's : Needs assistance/impaired                     Lower Body Dressing: Minimal assistance;Sit to/from stand;Cueing for compensatory techniques Lower Body Dressing Details (indicate cue type and reason): receptive to education with regard to AE, but declining to attempt use             Functional mobility during ADLs: Supervision/safety General ADL Comments: Patient session focus on AE education, further assessment of lower body ADL management, and overall education with regard to safe discharge home.    Extremity/Trunk Assessment              Vision       Perception     Praxis      Cognition Arousal/Alertness: Awake/alert Behavior During Therapy: WFL for tasks assessed/performed Overall Cognitive Status: Within Functional Limits for tasks assessed                                 General Comments: Patient participatory and appropriate        Exercises      Shoulder Instructions  General Comments      Pertinent Vitals/ Pain       Pain Assessment Pain Intervention(s): Limited activity within patient's tolerance, Monitored during session, Repositioned  Home Living                                          Prior Functioning/Environment              Frequency  Min 2X/week        Progress Toward Goals  OT Goals(current goals can now be found in the care plan section)  Progress towards OT goals: Progressing toward goals  Acute Rehab OT Goals Patient Stated Goal: to go home OT Goal Formulation: With patient Time For Goal Achievement: 04/04/22 Potential to Achieve Goals: Good  Plan Discharge plan remains appropriate    Co-evaluation                 AM-PAC OT "6 Clicks"  Daily Activity     Outcome Measure   Help from another person eating meals?: None Help from another person taking care of personal grooming?: None Help from another person toileting, which includes using toliet, bedpan, or urinal?: None Help from another person bathing (including washing, rinsing, drying)?: A Little Help from another person to put on and taking off regular upper body clothing?: None Help from another person to put on and taking off regular lower body clothing?: A Little 6 Click Score: 22    End of Session    OT Visit Diagnosis: Unsteadiness on feet (R26.81)   Activity Tolerance Patient tolerated treatment well   Patient Left with call bell/phone within reach;in chair   Nurse Communication Mobility status        Time: [id number] OT Time Calculation (min): 16 min  Charges: OT General Charges $OT Visit: 1 Visit OT Treatments $Self Care/Home Management : 8-22 mins  Corinne Ports E. Lucendia Leard, OTR/L Acute Rehabilitation Services [phone]   Ascencion Dike 03/23/2022, 11:56 AM

## 2022-03-23 NOTE — Progress Notes (Signed)
[facility] Progress Note   Assessment/ Plan:    82 year old with history of CKD, CHF, and cirrhosis, admitted for left lower extremity cellulitis and acute decompensated heart failure with AKI.  Acute kidney injury on CKD 4 Creatinine continues to improve.  Remains grossly volume overloaded on exam.  Continue present management. 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  Decompensated cirrhosis less likely.  Achieving brisk diuresis with above regimen.  Monitor BP closely in setting of RV failure as patient is likely somewhat preload dependent.  Left lower extremity cellulitis Antibiotics per primary team.  Anemia Chronic and stable.  Diabetes Insulin per primary.  Hypertension Continue holding antihypertensives during diuresis.  Subjective:   Feeling better today.  Swelling is decreased.  Dyspnea on exertion is improving.   Objective:   BP (!) 144/68 (BP Location: Left Arm)   Pulse 66   Temp 97.9 F (36.6 C) (Oral)   Resp 18   Ht '5\' 10"'$  (1.778 m)   Wt 105.9 kg   SpO2 99%   BMI 33.50 kg/m  Weight change: 4 kg   Intake/Output Summary (Last 24 hours) at 03/23/2022 1319 Last data filed at 03/23/2022 1137 Gross per 24 hour  Intake 697 ml  Output 5330 ml  Net -4633 ml   Net IO Since Admission: -7,199.05 mL [03/23/22 1319]  Physical Exam: Comfortable appearing, sitting upright on edge of bed Heart rate normal, rhythm is irregular, JVD while sitting upright, bilateral lower extremity pitting edema Breathing comfortably on room air, no crackles Warmth, tenderness, and erythema of left lower extremity No gross focal neurologic deficits  Labs, imaging, and other studies: Creatinine 4.45 BUN 81 Sodium 134 Potassium 4.3 CO2 24 Hemoglobin 9.4  Medications:     allopurinol  50 mg Oral Once per day on Mon Thu   apixaban  2.5 mg Oral BID    carvedilol  6.25 mg Oral BID WC   cholecalciferol  1,000 Units Oral Daily   vitamin B-12  1,000 mcg Oral Daily   furosemide  80 mg Intravenous BID   gabapentin  100 mg Oral BID   insulin aspart  0-6 Units Subcutaneous TID WC   levothyroxine  25 mcg Oral Q0600   midodrine  5 mg Oral TID WC   pantoprazole  40 mg Oral BID   vancomycin variable dose per unstable renal function (pharmacist dosing)   Does not apply See admin instructions   zinc sulfate  220 mg Oral Daily    Nani Gasser MD 03/23/2022, 1:19 PM

## 2022-03-24 DIAGNOSIS — I5033 Acute on chronic diastolic (congestive) heart failure: Secondary | ICD-10-CM | POA: Diagnosis not present

## 2022-03-24 LAB — GLUCOSE, CAPILLARY
Glucose-Capillary: 139 mg/dL — ABNORMAL HIGH (ref 70–99)
Glucose-Capillary: 190 mg/dL — ABNORMAL HIGH (ref 70–99)
Glucose-Capillary: 147 mg/dL — ABNORMAL HIGH (ref 70–99)
Glucose-Capillary: 158 mg/dL — ABNORMAL HIGH (ref 70–99)

## 2022-03-24 LAB — BASIC METABOLIC PANEL
Anion gap: 9 (ref 5–15)
BUN: 77 mg/dL — ABNORMAL HIGH (ref 8–23)
CO2: 27 mmol/L (ref 22–32)
Calcium: 8.6 mg/dL — ABNORMAL LOW (ref 8.9–10.3)
Chloride: 98 mmol/L (ref 98–111)
Creatinine, Ser: 4.13 mg/dL — ABNORMAL HIGH (ref 0.61–1.24)
GFR, Estimated: 14 mL/min — ABNORMAL LOW (ref >60)
Glucose, Bld: 148 mg/dL — ABNORMAL HIGH (ref 70–99)
Potassium: 4.2 mmol/L (ref 3.5–5.1)
Sodium: 134 mmol/L — ABNORMAL LOW (ref 135–145)

## 2022-03-24 LAB — VANCOMYCIN, RANDOM: Vancomycin Rm: 14 ug/mL

## 2022-03-24 LAB — IGG: IgG (Immunoglobin G), Serum: 1475 mg/dL (ref 603–1613)

## 2022-03-24 MED ORDER — VANCOMYCIN HCL IN DEXTROSE 1-5 GM/200ML-% IV SOLN
1000.0000 mg | Freq: Once | INTRAVENOUS | Status: AC
  Administered 2022-03-24: 1000 mg via INTRAVENOUS
  Filled 2022-03-24: qty 200

## 2022-03-24 NOTE — Progress Notes (Signed)
Pharmacy Antibiotic Note  Edward [name] is a 82 y.o. male admitted on 03/20/2022 with cellulitis.  Pharmacy has been consulted for vancomycin dosing.  Pt w/ acute on chronic renal failure.   SCr down to 4.13 (baseline ~2.5-3), good UOP with diuresis. He is afebrile, WBC are normal, and blood cultures are ngtd. Random vancomycin level this am is 14 and therapeutic (goal 10-15 mcg/ml).  Plan: - Vancomycin 1000 mg IV today - F/u renal function and consider random level on Mon  - F/u LOT   Height: '5\' 10"'$  (177.8 cm) Weight: 104.8 kg (231 lb 0.7 oz) IBW/kg (Calculated) : 73  Temp (24hrs), Avg:97.9 F (36.6 C), Min:97.8 F (36.6 C), Max:97.9 F (36.6 C)  Recent Labs  Lab 03/20/22 1112 03/20/22 1137 03/21/22 0108 03/22/22 0044 03/23/22 0036 03/24/22 0054  WBC  --  14.5* 10.4 8.0 6.7  --   CREATININE  --  5.04* 5.11* 4.84* 4.45* 4.13*  LATICACIDVEN 1.7  --   --   --   --   --   VANCORANDOM  --   --   --  13  --  14     Estimated Creatinine Clearance: 17.6 mL/min (A) (by C-G formula based on SCr of 4.13 mg/dL (H)).    Allergies  Allergen Reactions   Lipitor [Atorvastatin Calcium] Other (See Comments)    Weakness/ pain in legs    Thank you for involving pharmacy in this patient's care.  Billey Gosling, PharmD PGY1 Pharmacy Resident 3/9/20247:11 AM

## 2022-03-24 NOTE — Progress Notes (Signed)
[facility] Progress Note   Assessment/ Plan:    82 year old with history of CKD, CHF, and cirrhosis, admitted for left lower extremity cellulitis and acute decompensated heart failure with AKI.  Acute kidney injury on CKD 4 Creatinine continues to improve.  Remains grossly volume overloaded on exam.  Continue present management. 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  - think RV failure maybe driving the whole thing   Left lower extremity cellulitis Antibiotics per primary team. - added vanc today - potential for recurrent AKI- ? If asking ID if ceftaroline or something would be better to avoid another nephrotoxic agent  Anemia Chronic and stable.  Diabetes Insulin per primary.  Hypertension Continue holding antihypertensives during diuresis.  Subjective:    Cellulitis not improving- vanc added this AM.  Improved LE edema.     Objective:   BP 110/71 (BP Location: Left Arm)   Pulse 63   Temp 97.9 F (36.6 C) (Oral)   Resp 18   Ht '5\' 10"'$  (1.778 m)   Wt 104.8 kg   SpO2 98%   BMI 33.15 kg/m  Weight change: -7.2 kg   Intake/Output Summary (Last 24 hours) at 03/24/2022 0840 Last data filed at 03/24/2022 0608 Gross per 24 hour  Intake --  Output 3100 ml  Net -3100 ml   Net IO Since Admission: -9,099.05 mL [03/24/22 0840]  Physical Exam: NAD EOMI PERRL Clear bialterally RRR Abd protuberant 1+ LE edema R leg, 2+ LE edema L leg with redness/ swelling and sthickened skin around ankle  Medications:     allopurinol  50 mg Oral Once per day on Mon Thu   apixaban  2.5 mg Oral BID   carvedilol  6.25 mg Oral BID WC   cholecalciferol  1,000 Units Oral Daily   vitamin B-12  1,000 mcg Oral Daily   furosemide  80 mg Intravenous BID   gabapentin  100 mg Oral BID   insulin aspart  0-6 Units Subcutaneous TID WC   levothyroxine  25 mcg Oral Q0600    midodrine  5 mg Oral TID WC   pantoprazole  40 mg Oral BID   vancomycin variable dose per unstable renal function (pharmacist dosing)   Does not apply See admin instructions   zinc sulfate  220 mg Oral Daily    Madelon Lips MD 03/24/2022, 8:40 AM

## 2022-03-25 ENCOUNTER — Encounter: Payer: Self-pay | Admitting: Family Medicine

## 2022-03-25 ENCOUNTER — Telehealth: Payer: Self-pay | Admitting: Student

## 2022-03-25 DIAGNOSIS — L03116 Cellulitis of left lower limb: Secondary | ICD-10-CM | POA: Diagnosis not present

## 2022-03-25 DIAGNOSIS — I5033 Acute on chronic diastolic (congestive) heart failure: Secondary | ICD-10-CM | POA: Diagnosis not present

## 2022-03-25 LAB — BASIC METABOLIC PANEL
Anion gap: 11 (ref 5–15)
BUN: 70 mg/dL — ABNORMAL HIGH (ref 8–23)
CO2: 24 mmol/L (ref 22–32)
Calcium: 8.7 mg/dL — ABNORMAL LOW (ref 8.9–10.3)
Chloride: 100 mmol/L (ref 98–111)
Creatinine, Ser: 3.72 mg/dL — ABNORMAL HIGH (ref 0.61–1.24)
GFR, Estimated: 16 mL/min — ABNORMAL LOW (ref >60)
Glucose, Bld: 144 mg/dL — ABNORMAL HIGH (ref 70–99)
Potassium: 3.7 mmol/L (ref 3.5–5.1)
Sodium: 135 mmol/L (ref 135–145)

## 2022-03-25 LAB — GLUCOSE, CAPILLARY
Glucose-Capillary: 142 mg/dL — ABNORMAL HIGH (ref 70–99)
Glucose-Capillary: 182 mg/dL — ABNORMAL HIGH (ref 70–99)

## 2022-03-25 LAB — CULTURE, BLOOD (ROUTINE X 2)
Culture: NO GROWTH
Culture: NO GROWTH
Special Requests: ADEQUATE
Special Requests: ADEQUATE

## 2022-03-25 MED ORDER — DOXYCYCLINE HYCLATE 100 MG PO TABS
100.0000 mg | ORAL_TABLET | Freq: Two times a day (BID) | ORAL | Status: DC
  Administered 2022-03-25: 100 mg via ORAL
  Filled 2022-03-25: qty 1

## 2022-03-25 MED ORDER — FUROSEMIDE 40 MG PO TABS
80.0000 mg | ORAL_TABLET | Freq: Two times a day (BID) | ORAL | Status: DC
  Administered 2022-03-25: 80 mg via ORAL
  Filled 2022-03-25: qty 2

## 2022-03-25 MED ORDER — TORSEMIDE 20 MG PO TABS: 40.0000 mg | ORAL_TABLET | Freq: Two times a day (BID) | ORAL | Status: DC

## 2022-03-25 MED ORDER — POLYVINYL ALCOHOL 1.4 % OP SOLN: 1.0000 [drp] | OPHTHALMIC | 0 refills | Status: DC | PRN

## 2022-03-25 MED ORDER — ZINC 50 MG PO TABS: 50.0000 mg | ORAL_TABLET | Freq: Every day | ORAL | 0 refills | Status: DC

## 2022-03-25 MED ORDER — SPIRONOLACTONE 25 MG PO TABS: 25.0000 mg | ORAL_TABLET | Freq: Every day | ORAL | 3 refills | Status: DC

## 2022-03-25 MED ORDER — MIDODRINE HCL 5 MG PO TABS: 5.0000 mg | ORAL_TABLET | Freq: Three times a day (TID) | ORAL | 0 refills | Status: AC

## 2022-03-25 MED ORDER — DOXYCYCLINE HYCLATE 100 MG PO TABS: 100.0000 mg | ORAL_TABLET | Freq: Two times a day (BID) | ORAL | 0 refills | Status: AC

## 2022-03-25 MED ORDER — DOXYCYCLINE HYCLATE 100 MG PO TABS: 100.0000 mg | ORAL_TABLET | Freq: Two times a day (BID) | ORAL | Status: DC

## 2022-03-25 NOTE — Progress Notes (Signed)
Discharge instructions provided.  Patient and wife verbalize understanding of all instructions and follow-up care.  Telemetry removed and all belongings sent with patient.  Patient discharged to home.

## 2022-03-25 NOTE — Telephone Encounter (Signed)
Called patient regarding error in discharge instructions for what medications to hold until PCP follow-up.  Patient discharge papers stated to continue valsartan and spironolactone at home.  This was an error as medications were held in the setting of acute on chronic kidney injury.  Discussed both with patient and patient's wife who manages his medications that he should hold spironolactone and losartan until follow-up with his primary care doctor at the [place].  Patient verbalized understanding, and stated would remove from those pills from his pillbox.

## 2022-03-25 NOTE — Progress Notes (Signed)
     Daily Progress Note Intern Pager: [phone]  Patient name: DEMETRIOS BYRON Medical record number: [id number] Date of birth: 05/03/1940 Age: 82 y.o. Gender: male  Primary Care Provider: [facility] Consultants: Nephrology Code Status: Full code  Pt Overview and Major Events to Date:  Admitted: 30/5  Assessment and Plan: Kamareon Sciandra is a 82 year old admitted for acute on chronic heart failure exacerbation and a setting of chronic kidney disease and concomitant left lower extremity cellulitis. Pertinent PMH/PSH includes CAD s/p CABG, A-fib on Eliquis, liver cirrhosis, COPD, CHF, CKD 4, T2DM, PAD, Hx of tubular adenoma of the colon.  * Acute on chronic diastolic heart failure (HCC) Respiratory status improved. Diuresis with IV lasix, had 2.6L UOP yesterday and weight is stable. -Strict Is/Os -Daily Weight -IV Lasix 80mg  BID -Cardiorespiratory Monitoring     Cellulitis Improving on IV abx. 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 Minimal ascites not requiring paracentesis. GI will follow up outpatient -Follow-up Hepatitis labs redraw  Diabetes mellitus ([place]) Well controlled.  -Very sensitive sliding scale -CBGs with meals and at night  HTN (hypertension) Stable. Holding home Imdur and Valsartan.   -Continue Coreg -Continue Midodrine 5mg  TID   FEN/GI: Heart healthy/carb modified PPx: Lovenox Dispo:Home pending clinical improvement . Barriers include continued diuresing and IV antibiotics.   Subjective:  Patient said he is doing great. No concerns and expressed desire to be discharged. He has no complaint and report his LLE is always  swollen.  Objective: Temp:  [97 F (36.1 C)-98.2 F (36.8 C)] 98 F (36.7 C) (03/10 0527) Pulse Rate:  [62-70] 63 (03/10 0527) Resp:  [18-20] 18 (03/10 0527) BP: (125-144)/(59-74) 133/69 (03/10 0527) SpO2:  [97 %-100 %] 97 % (03/10 0527) Weight:  [103.1 kg-104.2 kg] 104.2 kg (03/10 0527)  Physical exam General: Alert, well appearing, NAD CV: RRR, no murmurs, normal S1/S2 Pulm: CTAB, good WOB on RA, no crackles or wheezing Abd: Soft, no distension, no tenderness Ext:  BLE pitting edema L > R. LLE erythema present   Laboratory: Most recent CBC Lab Results  Component Value Date   WBC 6.7 03/23/2022   HGB 9.4 (L) 03/23/2022   HCT 31.2 (L) 03/23/2022   MCV 85.0 03/23/2022   PLT 123 (L) 03/23/2022   Most recent BMP    Latest Ref Rng & Units 03/25/2022   12:34 AM  BMP  Glucose 70 - 99 mg/dL 144   BUN 8 - 23 mg/dL 70   Creatinine 0.61 - 1.24 mg/dL 3.72   Sodium 135 - 145 mmol/L 135   Potassium 3.5 - 5.1 mmol/L 3.7   Chloride 98 - 111 mmol/L 100   CO2 22 - 32 mmol/L 24   Calcium 8.9 - 10.3 mg/dL 8.7     Imaging/Diagnostic Tests: No new images  Alen Bleacher, MD 03/25/2022, 7:36 AM  PGY-2, [place] Intern pager: [phone], text pages welcome Secure chat group [facility] Teaching Service

## 2022-03-25 NOTE — Discharge Instructions (Addendum)
Dear Edward [name],   Thank you for letting us participate in your care! In this section, you will find a brief hospital admission summary of why you were admitted to the hospital and post-hospital plan.  You were admitted because you were experiencing lower leg swelling concerning for fluid retention and also found to have infection on the left leg.  You were treated with IV Lasix and antibiotics.   POST-HOSPITAL & CARE INSTRUCTIONS Please let PCP/Specialists know of any changes in medications that were made.  Please see medications section of this packet for any medication changes.  Please call your PCP to schedule a hospital follow-up appointment.  DOCTOR'S APPOINTMENTS & FOLLOW UP Future Appointments  Date Time Provider [place]  04/19/2022 10:30 AM [facility] VASC 1 - HC [facility]  04/19/2022 11:00 AM [facility] 1 - [facility]  04/19/2022 11:30 AM [facility] VASC 1 - HC [facility]  04/19/2022 12:45 PM [facility] PA-2 [facility]     Thank you for choosing [facility]! Take care and be well!  [facility]  [place], [place] [address] [phone]

## 2022-03-25 NOTE — Progress Notes (Addendum)
[facility] Progress Note   Assessment/ Plan:    82 year old with history of CKD, CHF, and cirrhosis, admitted for left lower extremity cellulitis and acute decompensated heart failure with AKI.  Acute kidney injury on CKD 4 Creatinine continues to improve.  Volume status better - switch furosemide to oral 80 mg BID - will need to add back spironolactone at some point- would get to baseline Cr - Midodrine 5 mg 3 times daily - nothing further to add at this point- will sign off.  Call with questions.   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  - think RV failure maybe driving the whole thing   Left lower extremity cellulitis Antibiotics per primary team. - added vanc today - potential for recurrent AKI- ? If asking ID if ceftaroline or something would be better to avoid another nephrotoxic agent  Anemia Chronic and stable.  Diabetes Insulin per primary.  Hypertension Continue holding antihypertensives during diuresis.  Subjective:    Volume status and Cr improving quite a bit.     Objective:   BP 137/73 (BP Location: Left Arm)   Pulse 60   Temp 97.6 F (36.4 C) (Oral)   Resp 16   Ht '5\' 10"'$  (1.778 m)   Wt 104.2 kg   SpO2 99%   BMI 32.96 kg/m  Weight change: -1.7 kg   Intake/Output Summary (Last 24 hours) at 03/25/2022 0935 Last data filed at 03/25/2022 0700 Gross per 24 hour  Intake 460 ml  Output 2250 ml  Net -1790 ml   Net IO Since Admission: -11,279.05 mL [03/25/22 0935]  Physical Exam: NAD EOMI PERRL Clear bialterally RRR Abd protuberant 1+ LE edema R leg, 2+ LE edema L leg with redness/ swelling and thickened skin around ankle--all edema improved  Medications:     allopurinol  50 mg Oral Once per day on Mon Thu   apixaban  2.5 mg Oral BID   carvedilol  6.25 mg Oral BID WC   cholecalciferol  1,000 Units Oral Daily   vitamin B-12  1,000 mcg Oral Daily   gabapentin  100 mg Oral BID    insulin aspart  0-6 Units Subcutaneous TID WC   levothyroxine  25 mcg Oral Q0600   midodrine  5 mg Oral TID WC   pantoprazole  40 mg Oral BID   torsemide  40 mg Oral BID   vancomycin variable dose per unstable renal function (pharmacist dosing)   Does not apply See admin instructions   zinc sulfate  220 mg Oral Daily    Madelon Lips MD 03/25/2022, 9:35 AM

## 2022-03-25 NOTE — Discharge Summary (Addendum)
[facility] Discharge Summary  Patient name: Edward [name] Medical record number: [id number] Date of birth: 16-Aug-1940 Age: 82 y.o. Gender: male Date of Admission: 03/20/2022  Date of Discharge: 03/25/2022 Admitting Physician: Salvadore Oxford, MD  Primary Care Provider: [facility] Consultants: Nephrology  Indication for Hospitalization: Lower extremity edema  Discharge Diagnoses/Problem List:  Acute on chronic diastolic heart failure Cellulitis AKI Liver cirrhosis Type 2 diabetes Hypertension  Disposition: Home  Discharge Condition: Stable  Discharge Exam:  General: Alert, well appearing, NAD CV: RRR, no murmurs, normal S1/S2 Pulm: CTAB, good WOB on RA, no crackles or wheezing Abd: Soft, no distension, no tenderness Ext:  BLE pitting edema L > R. LLE erythema present   Brief Hospital Course:  Edward [name] is a 82 y.o.male with a history of CAD, s/p CABG, Afib on eliquis, cirrhosis, CKD4, COPD, CHF, hx of tubular adenoma of the colon, PAD, and diabetes who was admitted to the [facility] Medicine Teaching Service at [facility] for acute HF exacerbation. His hospital course is detailed below:  Acute exacerbation of CHF (congestive heart failure)  Patient on admission appears volume overloaded with increased edema on LE bilateral lower extremity, increased weight from baseline and pulmonary congestion seen on x-ray.  BNP was 131.2.  Patient was diuresed during admission with IV Lasix and had a total UOP ~11L by discharge.  Cellulitis Ultrasound did not show drainable fluid collection. Remained Afebrile and WBC down-trending on Abx. Was treated with IV vanc and eventual switched to PO doxycycline prior to discharge.  Will continue doxycycline with last date of 03/30/2022 to complete 10 days course of antibiotics.  AKI (acute kidney injury)  Creatinine on admission was 5.41.  Likely prerenal secondary to poor renal perfusion.  Creatinine improved  by the time of discharge as we held some of his nephrotoxic medications including valsartan.  Cirrhosis of liver RUQ demonstrating small ascites. GI recommend outpatient follow-up, and unclear cause of ascites, given lack of access.  Autoimmune labs including anti-smooth muscle antibody were negative.  PCP Follow-up Recommendations: Please ensure that patient follows up with GI outpatient. He will need [place] screening every 6 months.  Advised to follow low sodium diet (no more than 2g)  Monitor renal function with BMP at follow up to decide when to restart spironolactone and valsartan. Patient started on midodrine 5 mg 3 times daily per nephrology Autoimmune lab follow-up Blood pressure medication held at discharge due to hypotension, recheck BP outpatient and add back as indicated. Left ankle lesion, please follow up with dermatology outpatient.    Significant Procedures: None  Significant Labs and Imaging:  Recent Labs  Lab 03/21/22 0108 03/22/22 0044 03/23/22 0036  WBC 10.4 8.0 6.7  HGB 9.1* 9.0* 9.4*  HCT 31.5* 28.7* 31.2*  PLT 106* 114* 123*   Recent Labs  Lab 03/20/22 1137 03/21/22 0108 03/22/22 0044 03/23/22 0036 03/24/22 0054 03/25/22 0034  NA 135 134* 133* 134* 134* 135  K 4.3 4.6 4.2 4.3 4.2 3.7  CL 102 100 101 101 98 100  CO2 22 21* '22 24 27 24  '$ GLUCOSE 127* 132* 156* 117* 148* 144*  BUN 73* 82* 83* 81* 77* 70*  CREATININE 5.04* 5.11* 4.84* 4.45* 4.13* 3.72*  CALCIUM 8.6* 8.4* 8.3* 8.5* 8.6* 8.7*  MG  --  2.2 2.1 2.0  --   --   ALKPHOS 115 102  --   --   --   --   AST 26 23  --   --   --   --  ALT 12 12  --   --   --   --   ALBUMIN 3.1* 2.9*  --   --   --   --     [place] COMPLETE JOINT SPACE STRUCTURES LOW LEFT  Result Date: 03/22/2022 CLINICAL DATA:  Knee pain.  Cellulitis. EXAM: LEFT LOWER EXTREMITY SOFT TISSUE ULTRASOUND COMPLETE TECHNIQUE: Ultrasound examination was performed including evaluation of the muscles, tendons, joint, and adjacent soft tissues.  COMPARISON:  Knee radiograph March 5 24. FINDINGS: Interspersed subcutaneous edema throughout the visualized left knee. No discrete, drainable fluid collection identified. IMPRESSION: Interspersed subcutaneous edema throughout the visualized left knee. No discrete, drainable fluid collection identified. Electronically Signed   By: Margaretha Sheffield M.D.   On: 03/22/2022 10:42   US Abdomen Complete  Result Date: 03/21/2022 CLINICAL DATA:  Renal dysfunction EXAM: ABDOMEN ULTRASOUND COMPLETE COMPARISON:  11/20/2021 FINDINGS: Gallbladder: Not seen consistent with cholecystectomy Common bile duct: Diameter: 2.1 mm Liver: There is nodularity in liver surface suggesting possible cirrhosis. No focal abnormalities are seen in visualized portions of liver. Portal vein is patent on color Doppler imaging with normal direction of blood flow towards the liver. IVC: No abnormality visualized. Pancreas: Visualized portion unremarkable. Spleen: Spleen measures 16.6 cm in maximum diameter. Right Kidney: Length: 9.7 cm. Echogenicity within normal limits. No mass or hydronephrosis visualized. Left Kidney: Length: 9.6 cm. Echogenicity within normal limits. No mass or hydronephrosis visualized. Abdominal aorta: No aneurysm visualized. Other findings: Small ascites is noted adjacent to the liver and spleen. IMPRESSION: Small ascites. Nodularity in liver surface suggests cirrhosis. Splenomegaly. Status post cholecystectomy. No sonographic abnormalities are seen in the kidneys. Electronically Signed   By: Elmer Picker M.D.   On: 03/21/2022 16:57   ECHOCARDIOGRAM COMPLETE  Result Date: 03/21/2022    ECHOCARDIOGRAM REPORT   Patient Name:   Edward [name] Date of Exam: 03/21/2022 Medical Rec #:  [id number]       Height:       70.0 in Accession #:    [id number]      Weight:       243.2 lb Date of Birth:  April 09, 1940       BSA:          2.268 m Patient Age:    29 years        BP:           127/58 mmHg Patient Gender: M                HR:           72 bpm. Exam Location:  Inpatient Procedure: 2D Echo Indications:    dyspnea  History:        Patient has prior history of Echocardiogram examinations, most                 recent 05/07/2021. Pacemaker, Arrythmias:Atrial Fibrillation and                 LBBB; Risk Factors:Hypertension and Diabetes.  Sonographer:    Harvie Junior Referring Phys: [id number] Tawanna Solo T ENIOLA  Sonographer Comments: Technically difficult study due to poor echo windows. IMPRESSIONS  1. Left ventricular ejection fraction, by estimation, is 55 to 60%. The left ventricle has normal function. The left ventricle has no regional wall motion abnormalities. There is moderate concentric left ventricular hypertrophy. Left ventricular diastolic parameters are indeterminate.  2. Right ventricular systolic function is moderately reduced. The right ventricular size is normal. There is mildly elevated pulmonary artery  systolic pressure. The estimated right ventricular systolic pressure is [id number] mmHg.  3. Left atrial size was mildly dilated.  4. Right atrial size was mildly dilated.  5. The mitral valve is normal in structure. Trivial mitral valve regurgitation. No evidence of mitral stenosis. Moderate mitral annular calcification.  6. The aortic valve is tricuspid. There is moderate calcification of the aortic valve. Aortic valve regurgitation is not visualized. Aortic valve sclerosis/calcification is present, without any evidence of aortic stenosis.  7. The inferior vena cava is dilated in size with <50% respiratory variability, suggesting right atrial pressure of 15 mmHg.  8. The patient appeared to be in atrial fibrillation. FINDINGS  Left Ventricle: Left ventricular ejection fraction, by estimation, is 55 to 60%. The left ventricle has normal function. The left ventricle has no regional wall motion abnormalities. The left ventricular internal cavity size was normal in size. There is  moderate concentric left ventricular hypertrophy. Left  ventricular diastolic parameters are indeterminate. Right Ventricle: The right ventricular size is normal. No increase in right ventricular wall thickness. Right ventricular systolic function is moderately reduced. There is mildly elevated pulmonary artery systolic pressure. The tricuspid regurgitant velocity is 2.47 m/s, and with an assumed right atrial pressure of 15 mmHg, the estimated right ventricular systolic pressure is [id number] mmHg. Left Atrium: Left atrial size was mildly dilated. Right Atrium: Right atrial size was mildly dilated. Pericardium: There is no evidence of pericardial effusion. Mitral Valve: The mitral valve is normal in structure. There is mild calcification of the mitral valve leaflet(s). Moderate mitral annular calcification. Trivial mitral valve regurgitation. No evidence of mitral valve stenosis. Tricuspid Valve: The tricuspid valve is normal in structure. Tricuspid valve regurgitation is trivial. Aortic Valve: The aortic valve is tricuspid. There is moderate calcification of the aortic valve. Aortic valve regurgitation is not visualized. Aortic valve sclerosis/calcification is present, without any evidence of aortic stenosis. Aortic valve mean gradient measures 6.0 mmHg. Aortic valve peak gradient measures 11.6 mmHg. Aortic valve area, by VTI measures 1.98 cm. Pulmonic Valve: The pulmonic valve was normal in structure. Pulmonic valve regurgitation is trivial. Aorta: The aortic root is normal in size and structure. Venous: The inferior vena cava is dilated in size with less than 50% respiratory variability, suggesting right atrial pressure of 15 mmHg. IAS/Shunts: No atrial level shunt detected by color flow Doppler. Additional Comments: A device lead is visualized in the right ventricle.  LEFT VENTRICLE PLAX 2D LVIDd:         4.00 cm     Diastology LVIDs:         2.60 cm     LV e' medial:    5.08 cm/s LV PW:         1.30 cm     LV E/e' medial:  23.6 LV IVS:        1.50 cm     LV e' lateral:    6.17 cm/s LVOT diam:     2.10 cm     LV E/e' lateral: 19.4 LV SV:         65 LV SV Index:   29 LVOT Area:     3.46 cm                             3D Volume EF: LV Volumes (MOD)           3D EF:        58 % LV vol d,  MOD A2C: 90.5 ml LV EDV:       163 ml LV vol d, MOD A4C: 91.6 ml LV ESV:       68 ml LV vol s, MOD A2C: 47.0 ml LV SV:        94 ml LV vol s, MOD A4C: 41.3 ml LV SV MOD A2C:     43.5 ml LV SV MOD A4C:     91.6 ml LV SV MOD BP:      46.7 ml RIGHT VENTRICLE RV Basal diam:  3.60 cm RV Mid diam:    3.00 cm RV S prime:     4.93 cm/s LEFT ATRIUM             Index        RIGHT ATRIUM           Index LA diam:        4.30 cm 1.90 cm/m   RA Area:     24.60 cm LA Vol (A2C):   55.2 ml 24.34 ml/m  RA Volume:   82.50 ml  36.37 ml/m LA Vol (A4C):   68.1 ml 30.03 ml/m LA Biplane Vol: 62.8 ml 27.69 ml/m  AORTIC VALVE                     PULMONIC VALVE AV Area (Vmax):    1.94 cm      PV Vmax:       0.94 m/s AV Area (Vmean):   1.84 cm      PV Peak grad:  3.6 mmHg AV Area (VTI):     1.98 cm AV Vmax:           170.00 cm/s AV Vmean:          115.250 cm/s AV VTI:            0.328 m AV Peak Grad:      11.6 mmHg AV Mean Grad:      6.0 mmHg LVOT Vmax:         95.10 cm/s LVOT Vmean:        61.150 cm/s LVOT VTI:          0.188 m LVOT/AV VTI ratio: 0.57  AORTA Ao Root diam: 3.10 cm Ao Asc diam:  3.20 cm MITRAL VALVE                TRICUSPID VALVE MV Area (PHT): 4.22 cm     TR Peak grad:   24.4 mmHg MV Decel Time: 180 msec     TR Vmax:        247.00 cm/s MR Peak grad: 16.8 mmHg MR Vmax:      205.00 cm/s   SHUNTS MV E velocity: 120.00 cm/s  Systemic VTI:  0.19 m MV A velocity: 42.40 cm/s   Systemic Diam: 2.10 cm MV E/A ratio:  2.83 Dalton McleanMD Electronically signed by Franki Monte Signature Date/Time: 03/21/2022/1:31:42 PM    Final    VAS [place] LOWER EXTREMITY VENOUS (DVT)  Result Date: 03/20/2022  Lower Venous DVT Study Patient Name:  ARIANA RICHWINE  Date of Exam:   03/20/2022 Medical Rec #: [id number]        Accession  #:    [id number] Date of Birth: 09-28-1940        Patient Gender: M Patient Age:   89 years Exam Location:  [facility] Procedure:      VAS [place] LOWER EXTREMITY VENOUS (DVT) Referring Phys: ABIGAIL HARRIS --------------------------------------------------------------------------------  Indications: Left leg swelling, warmth, redness.  Risk Factors: Remote history of left femoral-popliteal bypass. Limitations: Poor ultrasound/tissue interface and patient pain. Comparison Study: 08-02-2017 Prior lower extremity venous study was negative for                   DVT. Performing Technologist: Darlin Coco RDMS, RVT  Examination Guidelines: A complete evaluation includes B-mode imaging, spectral Doppler, color Doppler, and power Doppler as needed of all accessible portions of each vessel. Bilateral testing is considered an integral part of a complete examination. Limited examinations for reoccurring indications may be performed as noted. The reflux portion of the exam is performed with the patient in reverse Trendelenburg.  +-----+---------------+---------+-----------+----------+--------------+ RIGHTCompressibilityPhasicitySpontaneityPropertiesThrombus Aging +-----+---------------+---------+-----------+----------+--------------+ CFV  Full           Yes      Yes                                 +-----+---------------+---------+-----------+----------+--------------+   +---------+---------------+---------+-----------+----------+--------------+ LEFT     CompressibilityPhasicitySpontaneityPropertiesThrombus Aging +---------+---------------+---------+-----------+----------+--------------+ CFV      Full           Yes      Yes                                 +---------+---------------+---------+-----------+----------+--------------+ SFJ      Full                                                        +---------+---------------+---------+-----------+----------+--------------+ FV Prox  Full                                                         +---------+---------------+---------+-----------+----------+--------------+ FV Mid   Full                                                        +---------+---------------+---------+-----------+----------+--------------+ FV DistalFull                                                        +---------+---------------+---------+-----------+----------+--------------+ PFV      Full                                                        +---------+---------------+---------+-----------+----------+--------------+ POP      Full           Yes      Yes                                 +---------+---------------+---------+-----------+----------+--------------+  PTV      Full                                                        +---------+---------------+---------+-----------+----------+--------------+ PERO     Full                                                        +---------+---------------+---------+-----------+----------+--------------+    Summary: RIGHT: - No evidence of common femoral vein obstruction.  LEFT: - There is no evidence of deep vein thrombosis in the lower extremity.  - No cystic structure found in the popliteal fossa. - Ultrasound characteristics of enlarged lymph nodes noted in the groin.  *See table(s) above for measurements and observations. Electronically signed by Harold Barban MD on 03/20/2022 at 10:10:42 PM.    Final    DG Knee 1-2 Views Left  Result Date: 03/20/2022 CLINICAL DATA:  Left knee edema and redness. EXAM: LEFT KNEE - 1-2 VIEW COMPARISON:  None Available. 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 The arthroplasty components appear intact and in anatomic alignment. There is no acute fracture or dislocation. The bones are osteopenic. No joint effusion. Vascular calcifications and multiple surgical clips. There is diffuse subcutaneous edema. IMPRESSION: 1. No acute fracture or  dislocation. 2. Arthroplasty appears intact. 3. Diffuse subcutaneous edema. Electronically Signed   By: Anner Crete M.D.   On: 03/20/2022 17:17   DG Chest Port 1 View  Result Date: 03/20/2022 CLINICAL DATA:  Distention, history CHF EXAM: PORTABLE CHEST 1 VIEW COMPARISON:  05/06/2021 FINDINGS: Cardiac silhouette grossly enlarged. Pulmonary vascular congestion without focal consolidation. Left-sided pacer. Calcified aorta. Median sternotomy wires. Left atrial clip. IMPRESSION: Enlarged cardiac silhouette.  Pulmonary vascular congestion. Electronically Signed   By: Sammie Bench M.D.   On: 03/20/2022 11:30     Results/Tests Pending at Time of Discharge: Hepatitis labs  Discharge Medications:  Allergies as of 03/25/2022       Reactions   Lipitor [atorvastatin Calcium] Other (See Comments)   Weakness/ pain in legs        Medication List     TAKE these medications    acetaminophen 650 MG CR tablet Commonly known as: TYLENOL Take 650 mg by mouth every 8 (eight) hours as needed for pain.   albuterol 108 (90 Base) MCG/ACT inhaler Commonly known as: VENTOLIN HFA USE 2 PUFFS EVERY 4 HOURS AS NEEDED FOR WHEEZING   allopurinol 100 MG tablet Commonly known as: ZYLOPRIM Take 50 mg by mouth daily.   apixaban 2.5 MG Tabs tablet Commonly known as: ELIQUIS Take 2.5 mg by mouth 2 (two) times daily.   ascorbic acid 500 MG tablet Commonly known as: VITAMIN C Take 1,000 mg by mouth every Monday, Wednesday, and Friday.   blood glucose meter kit and supplies Dispense based on patient and insurance preference. Use up to four times daily as directed. (FOR ICD-10 E10.9, E11.9).   carvedilol 6.25 MG tablet Commonly known as: COREG Take 1 tablet (6.25 mg total) by mouth 2 (two) times daily.   cetirizine 10 MG tablet Commonly known as: ZYRTEC Take 10 mg by mouth daily.   cholecalciferol 25 MCG (  1000 UNIT) tablet Commonly known as: VITAMIN D3 Take 2,000 Units by mouth daily.    cyanocobalamin 1000 MCG tablet Take 1,000 mcg by mouth daily.   doxycycline 100 MG tablet Commonly known as: VIBRA-TABS Take 1 tablet (100 mg total) by mouth every 12 (twelve) hours for 5 days.   ferrous sulfate 325 (65 FE) MG tablet Take 650 mg by mouth every Monday, Wednesday, and Friday.   furosemide 40 MG tablet Commonly known as: LASIX TAKE 2 TABLETS BY MOUTH EVERY MORNING AND TAKE 1 TABLET IN THE AFTERNOON What changed:  how much to take how to take this when to take this additional instructions   gabapentin 100 MG capsule Commonly known as: NEURONTIN Take 200-300 mg by mouth See admin instructions. Take 2 capsules by mouth in the morning and 3 capsules in the evening   HYDROcodone-acetaminophen 5-325 MG tablet Commonly known as: NORCO/VICODIN Take 1 tablet by mouth 2 (two) times daily as needed for moderate pain.   insulin glargine 100 UNIT/ML injection Commonly known as: LANTUS Inject 48 Units into the skin in the morning.   isosorbide mononitrate 30 MG 24 hr tablet Commonly known as: IMDUR Take 30 mg by mouth daily.   levothyroxine 25 MCG tablet Commonly known as: SYNTHROID Take 25 mcg by mouth daily before breakfast.   Magnesium Oxide 420 MG Tabs Take 2 tablets by mouth at bedtime.   midodrine 5 MG tablet Commonly known as: PROAMATINE Take 1 tablet (5 mg total) by mouth 3 (three) times daily with meals.   multivitamin capsule Take 1 capsule by mouth daily.   pantoprazole 40 MG tablet Commonly known as: PROTONIX Take 40 mg by mouth daily.   polyvinyl alcohol 1.4 % ophthalmic solution Commonly known as: LIQUIFILM TEARS Place 1 drop into both eyes as needed for dry eyes.   potassium chloride 10 MEQ tablet Commonly known as: KLOR-CON M Take 10 mEq by mouth daily.   Semaglutide (1 MG/DOSE) 2 MG/1.5ML Sopn Inject 1 mg into the skin every Sunday.   spironolactone 25 MG tablet Commonly known as: ALDACTONE Take 1 tablet (25 mg total) by mouth  daily.   testosterone cypionate 200 MG/ML injection Commonly known as: DEPOTESTOSTERONE CYPIONATE Inject 200 mg into the muscle every 14 (fourteen) days. Given every other Sunday   valsartan 80 MG tablet Commonly known as: DIOVAN Take 80 mg by mouth daily.   Zinc 50 MG Tabs Take 1 tablet (50 mg total) by mouth daily.        Discharge Instructions: Please refer to Patient Instructions section of EMR for full details.  Patient was counseled important signs and symptoms that should prompt return to medical care, changes in medications, dietary instructions, activity restrictions, and follow up appointments.   Follow-Up Appointments:  Follow-up Information     Pyrtle, Lajuan Lines, MD. Schedule an appointment as soon as possible for a visit.   Specialty: Gastroenterology Contact information: [address] [place] [address] [phone]         Clinic, [place] Schedule an appointment as soon as possible for a visit.   Why: Please make an appointment at your earliest convenience for a hospital follow up. Contact information: [place] [address] [id number]                 Alen Bleacher, MD 03/25/2022, 3:09 PM PGY-2, [place]

## 2022-03-25 NOTE — Plan of Care (Signed)
  Problem: Clinical Measurements: Goal: Will remain free from infection Outcome: Completed/Met   Problem: Nutrition: Goal: Adequate nutrition will be maintained Outcome: Completed/Met   Problem: Pain Managment: Goal: General experience of comfort will improve Outcome: Completed/Met   Problem: Safety: Goal: Ability to remain free from injury will improve Outcome: Completed/Met

## 2022-03-25 NOTE — Telephone Encounter (Signed)
I was informed by patient's nurse that  patient's wife was unable to pick up doxycycline from [facility] on [address].  I called in prescription of doxycycline 100 mg twice daily for 5 days to the [facility] on [address]  Called wife and verified identity before informing patient's wife that medication has been sent to the pharmacy and she can pick up.  Also inform wife that pharmacy closes at 6 PM. Patient's wife said she would head out immediately to go pick up the medication.

## 2022-03-25 NOTE — Plan of Care (Signed)
Problem: Education: Goal: Ability to describe self-care measures that may prevent or decrease complications (Diabetes Survival Skills Education) will improve 03/25/2022 1400 by Reed Breech, RN Outcome: Adequate for Discharge 03/25/2022 1400 by Reed Breech, RN Outcome: Adequate for Discharge Goal: Individualized Educational Video(s) 03/25/2022 1400 by Reed Breech, RN Outcome: Adequate for Discharge 03/25/2022 1400 by Reed Breech, RN Outcome: Adequate for Discharge   Problem: Coping: Goal: Ability to adjust to condition or change in health will improve 03/25/2022 1400 by Reed Breech, RN Outcome: Adequate for Discharge 03/25/2022 1400 by Reed Breech, RN Outcome: Adequate for Discharge   Problem: Fluid Volume: Goal: Ability to maintain a balanced intake and output will improve 03/25/2022 1400 by Reed Breech, RN Outcome: Adequate for Discharge 03/25/2022 1400 by Reed Breech, RN Outcome: Adequate for Discharge   Problem: Health Behavior/Discharge Planning: Goal: Ability to identify and utilize available resources and services will improve 03/25/2022 1400 by Reed Breech, RN Outcome: Adequate for Discharge 03/25/2022 1400 by Reed Breech, RN Outcome: Adequate for Discharge Goal: Ability to manage health-related needs will improve 03/25/2022 1400 by Reed Breech, RN Outcome: Adequate for Discharge 03/25/2022 1400 by Reed Breech, RN Outcome: Adequate for Discharge   Problem: Metabolic: Goal: Ability to maintain appropriate glucose levels will improve 03/25/2022 1400 by Reed Breech, RN Outcome: Adequate for Discharge 03/25/2022 1400 by Reed Breech, RN Outcome: Adequate for Discharge   Problem: Nutritional: Goal: Maintenance of adequate nutrition will improve 03/25/2022 1400 by Reed Breech, RN Outcome: Adequate for  Discharge 03/25/2022 1400 by Reed Breech, RN Outcome: Adequate for Discharge Goal: Progress toward achieving an optimal weight will improve 03/25/2022 1400 by Reed Breech, RN Outcome: Adequate for Discharge 03/25/2022 1400 by Reed Breech, RN Outcome: Adequate for Discharge   Problem: Skin Integrity: Goal: Risk for impaired skin integrity will decrease 03/25/2022 1400 by Reed Breech, RN Outcome: Adequate for Discharge 03/25/2022 1400 by Reed Breech, RN Outcome: Adequate for Discharge   Problem: Tissue Perfusion: Goal: Adequacy of tissue perfusion will improve 03/25/2022 1400 by Reed Breech, RN Outcome: Adequate for Discharge 03/25/2022 1400 by Reed Breech, RN Outcome: Adequate for Discharge   Problem: Education: Goal: Knowledge of General Education information will improve Description: Including pain rating scale, medication(s)/side effects and non-pharmacologic comfort measures 03/25/2022 1400 by Reed Breech, RN Outcome: Adequate for Discharge 03/25/2022 1400 by Reed Breech, RN Outcome: Adequate for Discharge   Problem: Health Behavior/Discharge Planning: Goal: Ability to manage health-related needs will improve 03/25/2022 1400 by Reed Breech, RN Outcome: Adequate for Discharge 03/25/2022 1400 by Reed Breech, RN Outcome: Adequate for Discharge   Problem: Clinical Measurements: Goal: Ability to maintain clinical measurements within normal limits will improve 03/25/2022 1400 by Reed Breech, RN Outcome: Adequate for Discharge 03/25/2022 1400 by Reed Breech, RN Outcome: Adequate for Discharge Goal: Diagnostic test results will improve 03/25/2022 1400 by Reed Breech, RN Outcome: Adequate for Discharge 03/25/2022 1400 by Reed Breech, RN Outcome: Adequate for Discharge Goal: Respiratory complications will improve 03/25/2022  1400 by Reed Breech, RN Outcome: Adequate for Discharge 03/25/2022 1400 by Reed Breech, RN Outcome: Adequate for Discharge Goal: Cardiovascular complication will be avoided 03/25/2022 1400 by Reed Breech, RN Outcome: Adequate for Discharge 03/25/2022 1400 by Reed Breech, RN Outcome: Adequate for Discharge   Problem: Activity: Goal: Risk for activity intolerance will decrease 03/25/2022 1400 by  Reed Breech, RN Outcome: Adequate for Discharge 03/25/2022 1400 by Reed Breech, RN Outcome: Adequate for Discharge   Problem: Coping: Goal: Level of anxiety will decrease 03/25/2022 1400 by Reed Breech, RN Outcome: Adequate for Discharge 03/25/2022 1400 by Reed Breech, RN Outcome: Adequate for Discharge   Problem: Elimination: Goal: Will not experience complications related to bowel motility 03/25/2022 1400 by Reed Breech, RN Outcome: Adequate for Discharge 03/25/2022 1400 by Reed Breech, RN Outcome: Adequate for Discharge Goal: Will not experience complications related to urinary retention 03/25/2022 1400 by Reed Breech, RN Outcome: Adequate for Discharge 03/25/2022 1400 by Reed Breech, RN Outcome: Adequate for Discharge   Problem: Skin Integrity: Goal: Risk for impaired skin integrity will decrease 03/25/2022 1400 by Reed Breech, RN Outcome: Adequate for Discharge 03/25/2022 1400 by Reed Breech, RN Outcome: Adequate for Discharge

## 2022-03-25 NOTE — Plan of Care (Signed)
  Problem: Education: Goal: Ability to describe self-care measures that may prevent or decrease complications (Diabetes Survival Skills Education) will improve Outcome: Adequate for Discharge Goal: Individualized Educational Video(s) Outcome: Adequate for Discharge   Problem: Coping: Goal: Ability to adjust to condition or change in health will improve Outcome: Adequate for Discharge   Problem: Fluid Volume: Goal: Ability to maintain a balanced intake and output will improve Outcome: Adequate for Discharge   Problem: Health Behavior/Discharge Planning: Goal: Ability to identify and utilize available resources and services will improve Outcome: Adequate for Discharge Goal: Ability to manage health-related needs will improve Outcome: Adequate for Discharge   Problem: Metabolic: Goal: Ability to maintain appropriate glucose levels will improve Outcome: Adequate for Discharge   Problem: Nutritional: Goal: Maintenance of adequate nutrition will improve Outcome: Adequate for Discharge Goal: Progress toward achieving an optimal weight will improve Outcome: Adequate for Discharge   Problem: Skin Integrity: Goal: Risk for impaired skin integrity will decrease Outcome: Adequate for Discharge   Problem: Tissue Perfusion: Goal: Adequacy of tissue perfusion will improve Outcome: Adequate for Discharge   Problem: Education: Goal: Knowledge of General Education information will improve Description: Including pain rating scale, medication(s)/side effects and non-pharmacologic comfort measures Outcome: Adequate for Discharge   Problem: Health Behavior/Discharge Planning: Goal: Ability to manage health-related needs will improve Outcome: Adequate for Discharge   Problem: Clinical Measurements: Goal: Ability to maintain clinical measurements within normal limits will improve Outcome: Adequate for Discharge Goal: Diagnostic test results will improve Outcome: Adequate for  Discharge Goal: Respiratory complications will improve Outcome: Adequate for Discharge Goal: Cardiovascular complication will be avoided Outcome: Adequate for Discharge   Problem: Activity: Goal: Risk for activity intolerance will decrease Outcome: Adequate for Discharge   Problem: Coping: Goal: Level of anxiety will decrease Outcome: Adequate for Discharge   Problem: Elimination: Goal: Will not experience complications related to bowel motility Outcome: Adequate for Discharge Goal: Will not experience complications related to urinary retention Outcome: Adequate for Discharge   Problem: Skin Integrity: Goal: Risk for impaired skin integrity will decrease Outcome: Adequate for Discharge

## 2022-04-04 ENCOUNTER — Other Ambulatory Visit: Payer: Self-pay | Admitting: *Deleted

## 2022-04-04 DIAGNOSIS — I6529 Occlusion and stenosis of unspecified carotid artery: Secondary | ICD-10-CM

## 2022-04-04 DIAGNOSIS — I739 Peripheral vascular disease, unspecified: Secondary | ICD-10-CM

## 2022-04-04 DIAGNOSIS — R600 Localized edema: Secondary | ICD-10-CM

## 2022-04-04 DIAGNOSIS — Z9889 Other specified postprocedural states: Secondary | ICD-10-CM

## 2022-04-19 ENCOUNTER — Ambulatory Visit (INDEPENDENT_AMBULATORY_CARE_PROVIDER_SITE_OTHER)
Admission: RE | Admit: 2022-04-19 | Discharge: 2022-04-19 | Disposition: A | Discharge status: Home / Self Care | Payer: No Typology Code available for payment source | Source: Ambulatory Visit | Attending: Vascular Surgery | Admitting: Vascular Surgery

## 2022-04-19 ENCOUNTER — Ambulatory Visit (HOSPITAL_COMMUNITY)
Admission: RE | Admit: 2022-04-19 | Discharge: 2022-04-19 | Disposition: A | Discharge status: Home / Self Care | Payer: No Typology Code available for payment source | Source: Ambulatory Visit | Attending: Vascular Surgery | Admitting: Vascular Surgery

## 2022-04-19 ENCOUNTER — Ambulatory Visit: Payer: No Typology Code available for payment source

## 2022-04-19 DIAGNOSIS — Z9889 Other specified postprocedural states: Secondary | ICD-10-CM

## 2022-04-19 DIAGNOSIS — I6529 Occlusion and stenosis of unspecified carotid artery: Secondary | ICD-10-CM | POA: Diagnosis present

## 2022-04-19 DIAGNOSIS — I739 Peripheral vascular disease, unspecified: Secondary | ICD-10-CM

## 2022-04-19 DIAGNOSIS — R600 Localized edema: Secondary | ICD-10-CM

## 2022-04-19 LAB — VAS US ABI WITH/WO TBI
Left ABI: 1.14
Right ABI: 0.94

## 2022-05-03 ENCOUNTER — Ambulatory Visit: Payer: No Typology Code available for payment source

## 2022-05-10 ENCOUNTER — Ambulatory Visit (INDEPENDENT_AMBULATORY_CARE_PROVIDER_SITE_OTHER): Payer: No Typology Code available for payment source | Admitting: Physician Assistant

## 2022-05-10 VITALS — BP 118/75 | HR 81 | Temp 97.0°F | Resp 18 | Ht 70.0 in | Wt 225.0 lb

## 2022-05-10 DIAGNOSIS — M7989 Other specified soft tissue disorders: Secondary | ICD-10-CM

## 2022-05-10 DIAGNOSIS — I739 Peripheral vascular disease, unspecified: Secondary | ICD-10-CM

## 2022-05-10 DIAGNOSIS — Z9889 Other specified postprocedural states: Secondary | ICD-10-CM | POA: Diagnosis not present

## 2022-05-10 DIAGNOSIS — I6529 Occlusion and stenosis of unspecified carotid artery: Secondary | ICD-10-CM | POA: Diagnosis not present

## 2022-05-10 DIAGNOSIS — R600 Localized edema: Secondary | ICD-10-CM

## 2022-05-10 NOTE — Progress Notes (Signed)
Office Note   History of Present Illness   Edward [name] is a 82 y.o. ([birth year]) male who presents for surveillance of PAD and carotid artery stenosis.  He has a history of left femoropopliteal bypass with vein in February 2014 by Dr. Edilia Bo.  He also has a history of right carotid endarterectomy in March 2011.  He returns today for follow-up.  He has a chronic history of lower extremity swelling, which is unchanged on today's visit.  He is trying to wear compression stockings but has never found the right size.  His leg swelling is controlled with ace wraps bilaterally from the ankle to the knee.  He still has some skin cracking in bilateral lower extremities due to episodes of extensive swelling.  He denies any claudication, rest pain, nonhealing wounds of the lower extremities.  He denies any strokelike symptoms such as sudden weakness/numbness, slurred speech, facial drooping, or monocular blindness.   Current Outpatient Medications  Medication Sig Dispense Refill   acetaminophen (TYLENOL) 650 MG CR tablet Take 650 mg by mouth every 8 (eight) hours as needed for pain.     albuterol (PROVENTIL HFA;VENTOLIN HFA) 108 (90 Base) MCG/ACT inhaler USE 2 PUFFS EVERY 4 HOURS AS NEEDED FOR WHEEZING (Patient not taking: Reported on 03/20/2022) 8.5 Inhaler 0   allopurinol (ZYLOPRIM) 100 MG tablet Take 50 mg by mouth daily.     apixaban (ELIQUIS) 2.5 MG TABS tablet Take 2.5 mg by mouth 2 (two) times daily.     ascorbic acid (VITAMIN C) 500 MG tablet Take 1,000 mg by mouth every Monday, Wednesday, and Friday.     blood glucose meter kit and supplies Dispense based on patient and insurance preference. Use up to four times daily as directed. (FOR ICD-10 E10.9, E11.9). 1 each 0   carvedilol (COREG) 6.25 MG tablet Take 1 tablet (6.25 mg total) by mouth 2 (two) times daily. 180 tablet 3   cetirizine (ZYRTEC) 10 MG tablet Take 10 mg by mouth daily.     cholecalciferol (VITAMIN D3) 25 MCG (1000 UNIT) tablet  Take 2,000 Units by mouth daily.     cyanocobalamin 1000 MCG tablet Take 1,000 mcg by mouth daily.     ferrous sulfate 325 (65 FE) MG tablet Take 650 mg by mouth every Monday, Wednesday, and Friday.     furosemide (LASIX) 40 MG tablet TAKE 2 TABLETS BY MOUTH EVERY MORNING AND TAKE 1 TABLET IN THE AFTERNOON (Patient taking differently: Take 60-80 mg by mouth See admin instructions. Take 4 tablets by mouth in the morning and 3 tablets in the evening) 270 tablet 2   gabapentin (NEURONTIN) 100 MG capsule Take 200-300 mg by mouth See admin instructions. Take 2 capsules by mouth in the morning and 3 capsules in the evening     HYDROcodone-acetaminophen (NORCO/VICODIN) 5-325 MG tablet Take 1 tablet by mouth 2 (two) times daily as needed for moderate pain.     insulin glargine (LANTUS) 100 UNIT/ML injection Inject 48 Units into the skin in the morning.     isosorbide mononitrate (IMDUR) 30 MG 24 hr tablet Take 30 mg by mouth daily.     levothyroxine (SYNTHROID) 25 MCG tablet Take 25 mcg by mouth daily before breakfast.     Magnesium Oxide 420 MG TABS Take 2 tablets by mouth at bedtime.     Multiple Vitamin (MULTIVITAMIN) capsule Take 1 capsule by mouth daily.     pantoprazole (PROTONIX) 40 MG tablet Take 40 mg by mouth daily.  polyvinyl alcohol (LIQUIFILM TEARS) 1.4 % ophthalmic solution Place 1 drop into both eyes as needed for dry eyes. 15 mL 0   potassium chloride (KLOR-CON M) 10 MEQ tablet Take 10 mEq by mouth daily. (Patient not taking: Reported on 03/20/2022)     Semaglutide, 1 MG/DOSE, 2 MG/1.5ML SOPN Inject 1 mg into the skin every Sunday.     spironolactone (ALDACTONE) 25 MG tablet Take 1 tablet (25 mg total) by mouth daily. 30 tablet 3   testosterone cypionate (DEPOTESTOSTERONE CYPIONATE) 200 MG/ML injection Inject 200 mg into the muscle every 14 (fourteen) days. Given every other Sunday     valsartan (DIOVAN) 80 MG tablet Take 80 mg by mouth daily.     Zinc 50 MG TABS Take 1 tablet (50 mg  total) by mouth daily. 30 tablet 0   No current facility-administered medications for this visit.    REVIEW OF SYSTEMS (negative unless checked):   Cardiac:  [] Chest pain or chest pressure? [] Shortness of breath upon activity? [] Shortness of breath when lying flat? [x] Irregular heart rhythm?  Vascular:  [] Pain in calf, thigh, or hip brought on by walking? [] Pain in feet at night that wakes you up from your sleep? [] Blood clot in your veins? [x] Leg swelling?  Pulmonary:  [] Oxygen at home? [] Productive cough? [x] Wheezing?  Neurologic:  [] Sudden weakness in arms or legs? [] Sudden numbness in arms or legs? [] Sudden onset of difficult speaking or slurred speech? [] Temporary loss of vision in one eye? [] Problems with dizziness?  Gastrointestinal:  [] Blood in stool? [] Vomited blood?  Genitourinary:  [] Burning when urinating? [] Blood in urine?  Psychiatric:  [] Major depression  Hematologic:  [] Bleeding problems? [] Problems with blood clotting?  Dermatologic:  [] Rashes or ulcers?  Constitutional:  [] Fever or chills?  Ear/Nose/Throat:  [] Change in hearing? [] Nose bleeds? [] Sore throat?  Musculoskeletal:  [] Back pain? [] Joint pain? [] Muscle pain?   Physical Examination   Vitals:   05/10/22 0935  BP: 118/75  Pulse: 81  Resp: 18  Temp: (!) 97 F (36.1 C)  TempSrc: Temporal  SpO2: 99%  Weight: 225 lb (102.1 kg)  Height: 5\' 10" (1.778 m)   Body mass index is 32.28 kg/m.  General:  WDWN in NAD; vital signs documented above Gait: Not observed HENT: WNL, normocephalic Pulmonary: normal non-labored breathing, some right sided wheezing Cardiac: regular Abdomen: soft, NT, no masses Skin: without rashes Vascular Exam/Pulses: biphasic DP/PT doppler signals bilaterally.  Palpable radial pulses bilaterally Extremities: BLE edema 2+ with stasis pigmentation and cracking skin, L>R. No open wounds, cellulitis, ulcers, or  gangrene Musculoskeletal: no muscle wasting or atrophy  Neurologic: A&O X 3;  No focal weakness or paresthesias are detected Psychiatric:  The pt has Normal affect.  Non-Invasive Vascular Imaging ABI (04/19/2022) R:  ABI: 0.94 (0.96),  PT: bi DP: bi TBI:  0.89 L:  ABI: 1.14 (1.02),  PT: bi DP: bi TBI: 0.71  Bypass Duplex (04/19/2022) Left Graft #1: Femoral to popliteal  +--------------------+--------+--------+--------+--------+                     PSV cm/sStenosisWaveformComments  +--------------------+--------+--------+--------+--------+  Inflow             17 8             biphasic          +--------------------+--------+--------+--------+--------+  Proximal  Anastomosis97              biphasic          +--------------------+--------+--------+--------+--------+  Proximal Graft      85              biphasic          +--------------------+--------+--------+--------+--------+  Mid Graft           106             biphasic          +--------------------+--------+--------+--------+--------+  Distal Graft        69              biphasic          +--------------------+--------+--------+--------+--------+  Distal Anastomosis  100             biphasic          +--------------------+--------+--------+--------+--------+  Outflow            71              biphasic          +--------------------+--------+--------+--------+--------+   Carotid Duplex (04/19/2022) RICA stenosis: 1-39% LICA stenosis: 1-39%   Medical Decision Making   TARAY NORMOYLE is a 82 y.o. male who presents for surveillance of PAD and carotid artery stenosis  Based on the patient's vascular studies, his ABIs are essentially unchanged bilaterally.  His right ABI is 0.94 and his left ABI is 1.14.  He has biphasic DP/PT Doppler flow bilaterally Duplex of his left femoropopliteal bypass graft demonstrates a patent bypass with no hemodynamically significant stenosis. Carotid  duplex demonstrates unchanged 1 to 39% stenosis of bilateral internal carotid arteries On exam he has biphasic DP/PT Doppler signals.  He also has palpable radial pulses bilaterally.  He has no lower extremity wounds.  He still has some lower extremity swelling with skin cracking. He denies any claudication, rest pain, nonhealing wounds of lower extremities.  He denies any signs or symptoms of stroke.  He still has some issues with occasional shortness of breath due to CHF, but no shortness of breath today.  He does have some wheezing on the right, which he uses an inhaler for. He continues to take his Eliquis for atrial fibrillation.  We have measured the patient for compression stockings today, so hopefully this will fit him better and help with his leg swelling.  Given that he is asymptomatic with a patent left lower extremity bypass, and unchanged carotid studies, he can follow-up with our office in 1 year with repeat LLE BPG duplex, ABIs, and carotid duplex.  Loel Dubonnet PA-C [facility] Office: [phone]  Clinic MD: Myra Gianotti

## 2022-05-18 ENCOUNTER — Other Ambulatory Visit (HOSPITAL_COMMUNITY): Payer: Self-pay | Admitting: Family Medicine

## 2022-05-18 DIAGNOSIS — R188 Other ascites: Secondary | ICD-10-CM

## 2022-05-21 ENCOUNTER — Other Ambulatory Visit: Payer: Self-pay

## 2022-05-21 DIAGNOSIS — Z9889 Other specified postprocedural states: Secondary | ICD-10-CM

## 2022-05-21 DIAGNOSIS — I6529 Occlusion and stenosis of unspecified carotid artery: Secondary | ICD-10-CM

## 2022-05-23 ENCOUNTER — Ambulatory Visit (HOSPITAL_COMMUNITY)
Admission: RE | Admit: 2022-05-23 | Discharge: 2022-05-23 | Disposition: A | Discharge status: Home / Self Care | Payer: No Typology Code available for payment source | Source: Ambulatory Visit | Attending: Family Medicine | Admitting: Family Medicine

## 2022-05-23 DIAGNOSIS — K7460 Unspecified cirrhosis of liver: Secondary | ICD-10-CM | POA: Insufficient documentation

## 2022-05-23 DIAGNOSIS — R188 Other ascites: Secondary | ICD-10-CM | POA: Insufficient documentation

## 2022-05-23 HISTORY — PX: IR PARACENTESIS: IMG2679

## 2022-05-23 MED ORDER — LIDOCAINE HCL 1 % IJ SOLN
INTRAMUSCULAR | Status: AC
  Filled 2022-05-23: qty 20

## 2022-05-23 NOTE — Procedures (Signed)
PROCEDURE SUMMARY:  Successful US guided paracentesis from right lateral abdomen.  Yielded 4.0L of clear, yellow fluid.  No immediate complications.  Pt tolerated well.   Specimen was not sent for labs.  EBL < 5mL  Hoyt Koch PA-C 05/23/2022 9:58 AM

## 2022-05-25 ENCOUNTER — Emergency Department (HOSPITAL_COMMUNITY): Payer: No Typology Code available for payment source

## 2022-05-25 ENCOUNTER — Other Ambulatory Visit: Payer: Self-pay

## 2022-05-25 ENCOUNTER — Encounter (HOSPITAL_COMMUNITY): Payer: Self-pay

## 2022-05-25 ENCOUNTER — Inpatient Hospital Stay (HOSPITAL_COMMUNITY)
Admission: EM | Admit: 2022-05-25 | Discharge: 2022-05-26 | DRG: 433 | Disposition: A | Discharge status: Home / Self Care | Payer: No Typology Code available for payment source | Attending: Internal Medicine | Admitting: Internal Medicine

## 2022-05-25 DIAGNOSIS — I5032 Chronic diastolic (congestive) heart failure: Secondary | ICD-10-CM | POA: Diagnosis present

## 2022-05-25 DIAGNOSIS — R188 Other ascites: Secondary | ICD-10-CM | POA: Diagnosis present

## 2022-05-25 DIAGNOSIS — Z79899 Other long term (current) drug therapy: Secondary | ICD-10-CM

## 2022-05-25 DIAGNOSIS — K219 Gastro-esophageal reflux disease without esophagitis: Secondary | ICD-10-CM | POA: Diagnosis present

## 2022-05-25 DIAGNOSIS — Z95820 Peripheral vascular angioplasty status with implants and grafts: Secondary | ICD-10-CM

## 2022-05-25 DIAGNOSIS — D509 Iron deficiency anemia, unspecified: Secondary | ICD-10-CM | POA: Diagnosis present

## 2022-05-25 DIAGNOSIS — Z8551 Personal history of malignant neoplasm of bladder: Secondary | ICD-10-CM | POA: Diagnosis not present

## 2022-05-25 DIAGNOSIS — Z9049 Acquired absence of other specified parts of digestive tract: Secondary | ICD-10-CM

## 2022-05-25 DIAGNOSIS — K7290 Hepatic failure, unspecified without coma: Secondary | ICD-10-CM | POA: Diagnosis not present

## 2022-05-25 DIAGNOSIS — K7460 Unspecified cirrhosis of liver: Secondary | ICD-10-CM | POA: Diagnosis not present

## 2022-05-25 DIAGNOSIS — I130 Hypertensive heart and chronic kidney disease with heart failure and stage 1 through stage 4 chronic kidney disease, or unspecified chronic kidney disease: Secondary | ICD-10-CM | POA: Diagnosis present

## 2022-05-25 DIAGNOSIS — K7469 Other cirrhosis of liver: Secondary | ICD-10-CM | POA: Diagnosis present

## 2022-05-25 DIAGNOSIS — E1151 Type 2 diabetes mellitus with diabetic peripheral angiopathy without gangrene: Secondary | ICD-10-CM | POA: Diagnosis present

## 2022-05-25 DIAGNOSIS — E039 Hypothyroidism, unspecified: Secondary | ICD-10-CM | POA: Diagnosis present

## 2022-05-25 DIAGNOSIS — I480 Paroxysmal atrial fibrillation: Secondary | ICD-10-CM | POA: Diagnosis present

## 2022-05-25 DIAGNOSIS — K7581 Nonalcoholic steatohepatitis (NASH): Secondary | ICD-10-CM | POA: Diagnosis present

## 2022-05-25 DIAGNOSIS — N448 Other noninflammatory disorders of the testis: Secondary | ICD-10-CM | POA: Diagnosis present

## 2022-05-25 DIAGNOSIS — J449 Chronic obstructive pulmonary disease, unspecified: Secondary | ICD-10-CM | POA: Diagnosis present

## 2022-05-25 DIAGNOSIS — I495 Sick sinus syndrome: Secondary | ICD-10-CM | POA: Diagnosis present

## 2022-05-25 DIAGNOSIS — Z96652 Presence of left artificial knee joint: Secondary | ICD-10-CM | POA: Diagnosis present

## 2022-05-25 DIAGNOSIS — Z794 Long term (current) use of insulin: Secondary | ICD-10-CM | POA: Diagnosis not present

## 2022-05-25 DIAGNOSIS — Z87891 Personal history of nicotine dependence: Secondary | ICD-10-CM

## 2022-05-25 DIAGNOSIS — E1122 Type 2 diabetes mellitus with diabetic chronic kidney disease: Secondary | ICD-10-CM | POA: Diagnosis present

## 2022-05-25 DIAGNOSIS — Z951 Presence of aortocoronary bypass graft: Secondary | ICD-10-CM

## 2022-05-25 DIAGNOSIS — Z86008 Personal history of in-situ neoplasm of other site: Secondary | ICD-10-CM

## 2022-05-25 DIAGNOSIS — I70202 Unspecified atherosclerosis of native arteries of extremities, left leg: Secondary | ICD-10-CM | POA: Diagnosis present

## 2022-05-25 DIAGNOSIS — Z79890 Hormone replacement therapy (postmenopausal): Secondary | ICD-10-CM | POA: Diagnosis not present

## 2022-05-25 DIAGNOSIS — I2510 Atherosclerotic heart disease of native coronary artery without angina pectoris: Secondary | ICD-10-CM | POA: Diagnosis present

## 2022-05-25 DIAGNOSIS — Z888 Allergy status to other drugs, medicaments and biological substances status: Secondary | ICD-10-CM

## 2022-05-25 DIAGNOSIS — Z950 Presence of cardiac pacemaker: Secondary | ICD-10-CM

## 2022-05-25 DIAGNOSIS — Z7901 Long term (current) use of anticoagulants: Secondary | ICD-10-CM

## 2022-05-25 DIAGNOSIS — N184 Chronic kidney disease, stage 4 (severe): Secondary | ICD-10-CM | POA: Diagnosis present

## 2022-05-25 DIAGNOSIS — R601 Generalized edema: Secondary | ICD-10-CM | POA: Diagnosis not present

## 2022-05-25 DIAGNOSIS — Z7189 Other specified counseling: Secondary | ICD-10-CM

## 2022-05-25 LAB — COMPREHENSIVE METABOLIC PANEL
ALT: 12 U/L (ref 0–44)
AST: 25 U/L (ref 15–41)
Albumin: 3 g/dL — ABNORMAL LOW (ref 3.5–5.0)
Alkaline Phosphatase: 119 U/L (ref 38–126)
Anion gap: 11 (ref 5–15)
BUN: 37 mg/dL — ABNORMAL HIGH (ref 8–23)
CO2: 22 mmol/L (ref 22–32)
Calcium: 8.4 mg/dL — ABNORMAL LOW (ref 8.9–10.3)
Chloride: 103 mmol/L (ref 98–111)
Creatinine, Ser: 2.92 mg/dL — ABNORMAL HIGH (ref 0.61–1.24)
GFR, Estimated: 21 mL/min — ABNORMAL LOW (ref >60)
Glucose, Bld: 94 mg/dL (ref 70–99)
Potassium: 4 mmol/L (ref 3.5–5.1)
Sodium: 136 mmol/L (ref 135–145)
Total Bilirubin: 1.3 mg/dL — ABNORMAL HIGH (ref 0.3–1.2)
Total Protein: 7.2 g/dL (ref 6.5–8.1)

## 2022-05-25 LAB — BRAIN NATRIURETIC PEPTIDE: B Natriuretic Peptide: 182.2 pg/mL — ABNORMAL HIGH (ref 0.0–100.0)

## 2022-05-25 LAB — CBC WITH DIFFERENTIAL/PLATELET
Abs Immature Granulocytes: 0.05 10*3/uL (ref 0.00–0.07)
Basophils Absolute: 0.1 10*3/uL (ref 0.0–0.1)
Basophils Relative: 1 %
Eosinophils Absolute: 0.3 10*3/uL (ref 0.0–0.5)
Eosinophils Relative: 4 %
HCT: 36 % — ABNORMAL LOW (ref 39.0–52.0)
Hemoglobin: 10 g/dL — ABNORMAL LOW (ref 13.0–17.0)
Immature Granulocytes: 1 %
Lymphocytes Relative: 10 %
Lymphs Abs: 0.8 10*3/uL (ref 0.7–4.0)
MCH: 22.8 pg — ABNORMAL LOW (ref 26.0–34.0)
MCHC: 27.8 g/dL — ABNORMAL LOW (ref 30.0–36.0)
MCV: 82 fL (ref 80.0–100.0)
Monocytes Absolute: 1.2 10*3/uL — ABNORMAL HIGH (ref 0.1–1.0)
Monocytes Relative: 15 %
Neutro Abs: 5.4 10*3/uL (ref 1.7–7.7)
Neutrophils Relative %: 69 %
Platelets: 234 10*3/uL (ref 150–400)
RBC: 4.39 MIL/uL (ref 4.22–5.81)
RDW: 20.5 % — ABNORMAL HIGH (ref 11.5–15.5)
WBC: 7.8 10*3/uL (ref 4.0–10.5)
nRBC: 0 % (ref 0.0–0.2)

## 2022-05-25 LAB — GLUCOSE, CAPILLARY
Glucose-Capillary: 97 mg/dL (ref 70–99)
Glucose-Capillary: 155 mg/dL — ABNORMAL HIGH (ref 70–99)

## 2022-05-25 LAB — PROTIME-INR
INR: 1.6 — ABNORMAL HIGH (ref 0.8–1.2)
Prothrombin Time: 19.4 seconds — ABNORMAL HIGH (ref 11.4–15.2)

## 2022-05-25 LAB — TROPONIN I (HIGH SENSITIVITY)
Troponin I (High Sensitivity): 14 ng/L (ref <18)
Troponin I (High Sensitivity): 13 ng/L (ref <18)

## 2022-05-25 MED ORDER — GABAPENTIN 300 MG PO CAPS
300.0000 mg | ORAL_CAPSULE | Freq: Every day | ORAL | Status: DC
  Administered 2022-05-25: 300 mg via ORAL
  Filled 2022-05-25: qty 1

## 2022-05-25 MED ORDER — LEVOTHYROXINE SODIUM 25 MCG PO TABS
25.0000 ug | ORAL_TABLET | Freq: Every day | ORAL | Status: DC
  Administered 2022-05-25 – 2022-05-26 (×2): 25 ug via ORAL
  Filled 2022-05-25 (×2): qty 1

## 2022-05-25 MED ORDER — INSULIN GLARGINE-YFGN 100 UNIT/ML ~~LOC~~ SOLN
48.0000 [IU] | Freq: Every day | SUBCUTANEOUS | Status: DC
  Administered 2022-05-25: 48 [IU] via SUBCUTANEOUS
  Filled 2022-05-25 (×3): qty 0.48

## 2022-05-25 MED ORDER — APIXABAN 2.5 MG PO TABS
2.5000 mg | ORAL_TABLET | Freq: Two times a day (BID) | ORAL | Status: DC
  Administered 2022-05-25 – 2022-05-26 (×2): 2.5 mg via ORAL
  Filled 2022-05-25 (×3): qty 1

## 2022-05-25 MED ORDER — INSULIN ASPART 100 UNIT/ML IJ SOLN
0.0000 [IU] | Freq: Three times a day (TID) | INTRAMUSCULAR | Status: DC
  Administered 2022-05-26: 2 [IU] via SUBCUTANEOUS

## 2022-05-25 MED ORDER — CARVEDILOL 3.125 MG PO TABS
6.2500 mg | ORAL_TABLET | Freq: Two times a day (BID) | ORAL | Status: DC
  Administered 2022-05-25 – 2022-05-26 (×2): 6.25 mg via ORAL
  Filled 2022-05-25 (×3): qty 2

## 2022-05-25 MED ORDER — PANTOPRAZOLE SODIUM 40 MG PO TBEC
40.0000 mg | DELAYED_RELEASE_TABLET | Freq: Every day | ORAL | Status: DC
  Administered 2022-05-25 – 2022-05-26 (×2): 40 mg via ORAL
  Filled 2022-05-25 (×2): qty 1

## 2022-05-25 MED ORDER — GABAPENTIN 100 MG PO CAPS
200.0000 mg | ORAL_CAPSULE | Freq: Every morning | ORAL | Status: DC
  Administered 2022-05-26: 200 mg via ORAL
  Filled 2022-05-25: qty 2

## 2022-05-25 MED ORDER — SPIRONOLACTONE 25 MG PO TABS
25.0000 mg | ORAL_TABLET | Freq: Every day | ORAL | Status: DC
  Administered 2022-05-25: 25 mg via ORAL
  Filled 2022-05-25: qty 2
  Filled 2022-05-25: qty 1

## 2022-05-25 MED ORDER — FUROSEMIDE 10 MG/ML IJ SOLN
80.0000 mg | Freq: Once | INTRAMUSCULAR | Status: AC
  Administered 2022-05-25: 80 mg via INTRAVENOUS
  Filled 2022-05-25: qty 8

## 2022-05-25 MED ORDER — ENOXAPARIN SODIUM 30 MG/0.3ML IJ SOSY
30.0000 mg | PREFILLED_SYRINGE | INTRAMUSCULAR | Status: DC
Start: 2022-05-25 — End: 2022-05-25

## 2022-05-25 NOTE — H&P (Addendum)
Date: 05/25/2022               Patient Name:  Edward [name] MRN: [id number]  DOB: 1940/05/23 Age / Sex: 82 y.o., male   PCP: [facility]              Medical Service: Internal Medicine Teaching Service              Attending Physician: Dr. Ninetta Lights, Lacretia Leigh, MD    First Contact: Lyda Kalata, MS 4 Pager: [phone]  Second Contact: Dr. Rudene Christians Pager: [phone]            After Hours (After 5p/  First Contact Pager: [phone]  weekends / holidays): Second Contact Pager: [phone]    SUBJECTIVE   Chief Complaint: Testicular swelling   History of Present Illness: Edward [name] is a 82 y.o. person living with a history of CAD, Afib on eliquis, s/p CABG, cirrhosis (from fatty liver), CKD4, COPD, CHF, hx of tubular adenoma of the colon, PAD, bladder cancer, and diabetes who presented with worsening testicular swelling for two days. Patient had paracentesis performed two days ago for worsening ascites during which 4L of fluid was removed. Swelling quickly re-accumulated around abdomen and groin following paracentesis. Per patient's wife, current level of fluid accumulation appears worse than level observed prior to paracentesis. Patient has also had difficulty urinating during this period -- last urinated on 04/09. Of note, he has had two prior paracenteses performed (April 2023 & April 2024). Patient's first paracentesis in April 2023 did not have rapid fluid re-accumulation and provided some sustained relief. Denies pain, fever, headache, vision changes, shortness of breath, orthopnea, abdominal pain, nausea/vomiting, and diarrhea. Patient has a long-standing history of lower extremity edema and this appears at baseline. Also has leg numbness/loss of sensation secondary to diabetes. Patient receives the majority of his care through the [place].    ED Course: Received IV furosemide 80 mg (x1)  Meds:   Gemcitabine/Docetaxel    K 10 meq Zinc 50 Midodrine 5 mg  TID Spironolactone 25 mg Valsartan 80 mg Glargine 48 units Apixaban 2.5 mg BID Carvedilol 6.25 mg BID Furosemide 80 mg in am and 60mg  pm Gabapentin 200 then 300 Ozempic 1 mg Sunday Testosterone 0.8 mL every 2 wks Allopurinol 100 mg Levothyroxine 25 mg Pantoprazole 40 mg   Past Medical History - CAD, s/p CABG - PAD, s/p left femoral to below the knee popliteal artery bypass  - Cirrhosis (NASH) - CKD4 - Chronic HFpEF - A-fib (on eliquis)  - Sick sinus syndrome, s/p pacemaker - COPD - Urothelial carcinoma in situ - GERD  - Type II DM  - Hypertension  - Iron Deficiency Anemia    Past Surgical History:  Procedure Laterality Date   ABDOMINAL AORTAGRAM N/A 01/07/2012   Procedure: ABDOMINAL AORTAGRAM;  Surgeon: Chuck Hint, MD;  Location: [facility] CATH LAB;  Service: Cardiovascular;  Laterality: N/A;   BALLOON DILATION N/A 02/17/2013   Procedure: BALLOON DILATION;  Surgeon: Beverley Fiedler, MD;  Location: [facility] ENDOSCOPY;  Service: Gastroenterology;  Laterality: N/A;   BIOPSY  03/17/2021   Procedure: BIOPSY;  Surgeon: Iva Boop, MD;  [facility]COPY;  Service: Gastroenterology;;   CAROTID ENDARTERECTOMY Right 03/2009   CATARACT EXTRACTION Bilateral    CHOLECYSTECTOMY     CIRCUMCISION     COLONOSCOPY WITH PROPOFOL N/A 02/17/2013   Procedure: COLONOSCOPY WITH PROPOFOL;  Surgeon: Beverley Fiedler, MD;  Location: [facility] ENDOSCOPY;  Service: Gastroenterology;  Laterality:  N/A;   COLONOSCOPY WITH PROPOFOL N/A 03/18/2021   Procedure: COLONOSCOPY WITH PROPOFOL;  Surgeon: Jeani Hawking, MD;  Location: [facility] ENDOSCOPY;  Service: Gastroenterology;  Laterality: N/A;   CORONARY ARTERY BYPASS GRAFT  06/14/2011   Procedure: CORONARY ARTERY BYPASS GRAFTING (CABG);  Surgeon: Alleen Borne, MD;  Location: [facility] OR;  Service: Open Heart Surgery;  Laterality: N/A;  Coronary Artery Bypass Graft on pump times six;  utilizing internal mammary artery and right greater saphenous vein harvested endoscopically.x 5  vessels   ELECTROPHYSIOLOGY STUDY N/A 11/05/2012   Procedure: ELECTROPHYSIOLOGY STUDY;  Surgeon: Duke Salvia, MD;  Location: [facility] CATH LAB;  Service: Cardiovascular;  Laterality: N/A;   ESOPHAGOGASTRODUODENOSCOPY (EGD) WITH PROPOFOL N/A 02/17/2013   Procedure: ESOPHAGOGASTRODUODENOSCOPY (EGD) WITH PROPOFOL;  Surgeon: Beverley Fiedler, MD;  Location: [facility] ENDOSCOPY;  Service: Gastroenterology;  Laterality: N/A;   ESOPHAGOGASTRODUODENOSCOPY (EGD) WITH PROPOFOL N/A 03/17/2021   Procedure: ESOPHAGOGASTRODUODENOSCOPY (EGD) WITH PROPOFOL;  Surgeon: Iva Boop, MD;  Location: [facility] ENDOSCOPY;  Service: Gastroenterology;  Laterality: N/A;   FEMORAL-POPLITEAL BYPASS GRAFT Left 03/06/2012   Procedure: BYPASS GRAFT FEMORAL-POPLITEAL ARTERY;  Surgeon: Chuck Hint, MD;  Location: [facility] OR;  Service: Vascular;  Laterality: Left;  Left Femoral - Below Knee Popliteal Bypass Graft with Vein and Intraoperative Arteriogram.   IR PARACENTESIS  05/23/2022   JOINT REPLACEMENT Left    left TKA   KNEE ARTHROSCOPY Left    "I had 3" (11/05/2012)   LEAD REVISION  11/05/2012   pacemaker/notes 11/05/2012   LEAD REVISION N/A 11/05/2012   Procedure: LEAD REVISION;  Surgeon: Duke Salvia, MD;  Location: [facility] CATH LAB;  Service: Cardiovascular;  Laterality: N/A;   MAZE  06/14/2011   Procedure: MAZE;  Surgeon: Alleen Borne, MD;  Location: [facility] OR;  Service: Open Heart Surgery;  Laterality: N/A;  Ligate left atrial appendage   PERMANENT PACEMAKER INSERTION N/A 06/20/2011   Procedure: PERMANENT PACEMAKER INSERTION;  Surgeon: Duke Salvia, MD;  Location: [facility] CATH LAB;  Service: Cardiovascular;  Laterality: N/A;   POLYPECTOMY  03/18/2021   Procedure: POLYPECTOMY;  Surgeon: Jeani Hawking, MD;  Location: [facility] ENDOSCOPY;  Service: Gastroenterology;;   TONSILLECTOMY AND ADENOIDECTOMY     TOTAL KNEE ARTHROPLASTY Left     Social:  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 Enjoys living independently, spending time  watching TV or using the computer. Wife is primary support resource and patient values his privacy. Uses alcohol occasionally (1-2 drinks/wk). Former smoker and, per chart review, has 96.00 pack-year smoking history.    Family History: No pertinent family history obtained.   Allergies: Allergies as of 05/25/2022 - Review Complete 05/25/2022  Allergen Reaction Noted   Lipitor [atorvastatin calcium] Other (See Comments) 05/30/2010   Review of Systems: A complete ROS was negative except as per HPI.   OBJECTIVE:   Physical Exam: Blood pressure 139/88, pulse 68, temperature 97.9 F (36.6 C), temperature source Oral, resp. rate 14, height 5\' 10"  (1.778 m), weight 104.3 kg, SpO2 100 %.  Constitutional: well-appearing, lying in bed, no acute distress HENT: normocephalic atraumatic, mucous membranes moist Eyes: conjunctiva non-erythematous, anicteric  Neck: supple Cardiovascular: regular rate and rhythm, no m/r/g; dorsalis pedis pulses, non-palpable bilaterally   Pulmonary/Chest: normal work of breathing on room air, lungs clear to auscultation bilaterally Abdominal: soft, non-tender, normoactive bowel sounds; moderate-severely distended w/ fluid; bandage on right abdomen (site of paracentesis) without drainage MSK: 1+ pitting edema, bilateral lower extremities; brawny discoloration with overlying crusting/scale,  bilateral lower extremities; non-draining, non-healing ulceration, dorsum of right foot GU: significant uniform scrotal swelling with erythema, no pain to palpation of scrotum, no scrotal lumps   Neurological: alert & oriented x 3; no asterixis; decreased lower extremity sensation to touch, bilaterally (L>R)  Skin: warm and dry, no yellowing/signs of jaundice   Labs: CBC    Component Value Date/Time   WBC 7.8 05/25/2022 0950   RBC 4.39 05/25/2022 0950   HGB 10.0 (L) 05/25/2022 0950   HCT 36.0 (L) 05/25/2022 0950   PLT 234 05/25/2022 0950   MCV 82.0 05/25/2022 0950   MCV 74.2  (A) 02/15/2015 1345   MCH 22.8 (L) 05/25/2022 0950   MCHC 27.8 (L) 05/25/2022 0950   RDW 20.5 (H) 05/25/2022 0950   LYMPHSABS 0.8 05/25/2022 0950   MONOABS 1.2 (H) 05/25/2022 0950   EOSABS 0.3 05/25/2022 0950   BASOSABS 0.1 05/25/2022 0950     CMP     Component Value Date/Time   NA 136 05/25/2022 0950   NA 140 04/04/2018 1100   K 4.0 05/25/2022 0950   CL 103 05/25/2022 0950   CO2 22 05/25/2022 0950   GLUCOSE 94 05/25/2022 0950   BUN 37 (H) 05/25/2022 0950   BUN 35 (H) 04/04/2018 1100   CREATININE 2.92 (H) 05/25/2022 0950   CREATININE 1.76 (H) 02/06/2016 1124   CALCIUM 8.4 (L) 05/25/2022 0950   PROT 7.2 05/25/2022 0950   PROT 7.3 08/02/2017 1649   ALBUMIN 3.0 (L) 05/25/2022 0950   ALBUMIN 4.2 08/02/2017 1649   AST 25 05/25/2022 0950   ALT 12 05/25/2022 0950   ALKPHOS 119 05/25/2022 0950   BILITOT 1.3 (H) 05/25/2022 0950   BILITOT 0.7 08/02/2017 1649   GFRNONAA 21 (L) 05/25/2022 0950   GFRAA 30 (L) 05/03/2018 1128   BNP: 182.2  Troponin I: 14 Protime/INR: 19.4/1.6   Imaging:   CXR:  Impression  - Minimal left basilar subsegmental atelectasis with small left pleural effusion.  EKG: personally reviewed my interpretation is a-fib. Prior EKG unchanged.   ASSESSMENT & PLAN:   Assessment & Plan by Problem: Principal Problem:   Decompensated hepatic cirrhosis (HCC)   Edward [name] is a 82 y.o. person living with a history of cirrhosis (NASH) and bladder cancer who presents with sudden scrotal edema after recent paracentesis, admitted for medical management.   # Sudden-onset scrotal edema s/p paracentesis  Cirrhosis (NASH)  Patient's sudden-onset significant scrotal edema following recent paracentesis is concerning for possible fistula tract formation between peritoneal cavity and fascia continuous with scrotum (Camper's, Scarpa's) [1]. While such a complication is considered rare, patient's presentation of edema primarily of the scrotum with sudden-onset timeline  is consistent with this etiology as documented in prior case studies [1,2]. Furthermore, patient's history of multiple paracenteses may increase risk of fistula formation. Suggested management is primarily conservative with oral diuretic therapy, scrotal elevation. diuresis, scrotal elevation. Worsening of patient's cirrhosis is also possible, particularly given history of repeat paracenteses over shorter time period. Patient was also recently treated for a HFpEF exacerbation roughly 2 months ago, which could also contribute to worsening fluid resuscitation. No additional signs of volume overload on exam is reassuring (no pulmonary edema, no new lower extremity swelling). No signs of epididymitis or bacterial peritonitis (non-tender, no systemic signs of infection). No signs of varicocele (no "bag of worms" findings on exam). Patient last received chemotherapy for bladder cancer on 04/15, which included Docetaxel -- a medication that has been associated with increased fluid retention  in patient. This may be contributing to patient's general worsening ascites and subsequent need for more frequent paracenteses.  - IV Furosemide 80 mg daily  - Home midodrine 5 mg TID - Scrotal elevation  - Place foley catheter  - IR consult for repeat paracentesis  - Palliative care consult for likely worsening cirrhosis  #Chronic diastolic heart failure  CAD, s/p CABG  #Hypertension  Patient's most recent ECHO (03/2022) demonstrated a normal EF (55-60%) without signs of diastolic dysfunction. On exam today, patient appears volume-overloaded with ascites, testicular edema. However, this does not appear to be caused by acute decompensation of prior CHF as patient's lower-extremity edema is at baseline and he has no signs of pulmonary edema on imaging or physical exam. Patient's BNP is increased but remains near value observed over the past year. - Home spironolactone 25 mg PO daily   - Home coreg 6.25 mg PO BID  - Home  valsartan 80 mg PO daily  - Strict I/Os  - Daily weights   #CKD IV  Cr 2.92. Appears to have improved in recent months from Cr ~5.0. Now within baseline range of 2-3. Will need to renally dose home medications, however. - Daily BMP   #Type II DM  Last A1c of 6.1 collected ~2 months ago, at goal. However, patient has signs of diabetic neuropathy with decreased sensation to fine touch of the lower extremities. No plantar ulcers noted.   - Home glargine 48 units  - Start SSI  - Home gabapentin 200 mg PO daily (AM)  - Home gabapentin 300 mg PO daily (PM)   #Hypothyroidism TSH of 6.516 on 03/05. No clinical signs of thyroid abnormalities on admission. Will resume home medications.  - Home levothyroxine 25 mcg PO daily   #GERD - Home Pantoprazole 40 mg PO daily   #A-fib  Sick sinus syndrome, s/p pacemaker  ECG consistent with long-standing a-fib. No ischemic changes. No changes from prior.  - Home eliquis 2.5 mg PO BID    Abed H, DaCosta J, Bellafiore P, Bains Y, DaCosta T. Post-paracentesis scrotal edema: A case report. [facility] Open Med Case Rep. 2023 Feb 15;11:2050313 [id number]. doi: 10.1177/2050313 [id number]. PMID: 29562130; PMCID: [id number]. CONN, H.O., 1971. 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 Annals of Internal Medicine, 74(6), [phone].   Diet: Normal VTE:  Eliquis IVF: None,None Code: Full  Prior to Admission Living Arrangement: Home, living with wife Anticipated Discharge Location: Home  Dispo: Admit patient to Inpatient with expected length of stay greater than 2 midnights.  Signed: Lyda Kalata, Medical Student 05/25/2022, 2:08 PM  Pager: @[phone] @

## 2022-05-25 NOTE — Plan of Care (Signed)
  Problem: Education: Goal: Ability to describe self-care measures that may prevent or decrease complications (Diabetes Survival Skills Education) will improve Outcome: Progressing   Problem: Coping: Goal: Ability to adjust to condition or change in health will improve Outcome: Progressing   Problem: Health Behavior/Discharge Planning: Goal: Ability to manage health-related needs will improve Outcome: Progressing   Problem: Nutritional: Goal: Progress toward achieving an optimal weight will improve Outcome: Progressing   Problem: Skin Integrity: Goal: Risk for impaired skin integrity will decrease Outcome: Progressing   Problem: Tissue Perfusion: Goal: Adequacy of tissue perfusion will improve Outcome: Progressing

## 2022-05-25 NOTE — ED Notes (Signed)
ED TO INPATIENT HANDOFF REPORT  ED Nurse Name and Phone #: [phone], Shelda Pal Name/Age/Gender Edward [name] 82 y.o. male Room/Bed: 016C/016C  Code Status   Code Status: Full Code  Home/SNF/Other Home Patient oriented to: self, place, time, and situation Is this baseline? Yes   Triage Complete: Triage complete  Chief Complaint Decompensated hepatic cirrhosis (HCC) [K72.90, K74.60]  Triage Note Pt came to ED for swollen testicles for 1 day. Axox4. Pt denies pain. Hx of CHF.    Allergies Allergies  Allergen Reactions   Lipitor [Atorvastatin Calcium] Other (See Comments)    Weakness/ pain in legs    Level of Care/Admitting Diagnosis ED Disposition     ED Disposition  Admit   Condition  --   Comment  Hospital Area: [facility] [[id number]]  Level of Care: Med-Surg [16]  May admit patient to [facility] or [facility] if equivalent level of care is available:: No  Covid Evaluation: Asymptomatic - no recent exposure (last 10 days) testing not required  Diagnosis: Decompensated hepatic cirrhosis [facility]) [[id number]]  Admitting Physician: Ginnie Smart [[id number]]  Attending Physician: Ninetta Lights, JEFFREY C [[id number]]  Certification:: I certify this patient will need inpatient services for at least 2 midnights  Estimated Length of Stay: 2          B Medical/Surgery History Past Medical History:  Diagnosis Date   Anemia    ?   Carotid artery occlusion    Chronic diastolic CHF (congestive heart failure) (HCC) 04/23/2012   CKD (chronic kidney disease), stage III (HCC)    Claudication (HCC)    Coronary artery disease    s/p CABG 2013   Diabetic neuropathy (HCC)    mostly feet/ legs   Diverticulosis    Dyslipidemia    Exertional shortness of breath    GERD (gastroesophageal reflux disease)    GI bleed    while on triple anticoag therapy   HTN (hypertension)    Pacemaker    PAF (paroxysmal atrial fibrillation) (HCC)    s/p MAZE at time of CABG   PVD  (peripheral vascular disease) (HCC)    R CEA 2011, prior PTA, left fem-pop 2014   Stroke (HCC)    vs TIA   Tubular adenoma of colon    Type II diabetes mellitus (HCC)    Past Surgical History:  Procedure Laterality Date   ABDOMINAL AORTAGRAM N/A 01/07/2012   Procedure: ABDOMINAL Ronny Flurry;  Surgeon: Chuck Hint, MD;  Location: [facility] CATH LAB;  Service: Cardiovascular;  Laterality: N/A;   BALLOON DILATION N/A 02/17/2013   Procedure: BALLOON DILATION;  Surgeon: Beverley Fiedler, MD;  Location: [facility] ENDOSCOPY;  Service: Gastroenterology;  Laterality: N/A;   BIOPSY  03/17/2021   Procedure: BIOPSY;  Surgeon: Iva Boop, MD;  Location: [facility] ENDOSCOPY;  Service: Gastroenterology;;   CAROTID ENDARTERECTOMY Right 03/2009   CATARACT EXTRACTION Bilateral    CHOLECYSTECTOMY     CIRCUMCISION     COLONOSCOPY WITH PROPOFOL N/A 02/17/2013   Procedure: COLONOSCOPY WITH PROPOFOL;  Surgeon: Beverley Fiedler, MD;  Location: [facility] ENDOSCOPY;  Service: Gastroenterology;  Laterality: N/A;   COLONOSCOPY WITH PROPOFOL N/A 03/18/2021   Procedure: COLONOSCOPY WITH PROPOFOL;  Surgeon: Jeani Hawking, MD;  Location: [facility] ENDOSCOPY;  Service: Gastroenterology;  Laterality: N/A;   CORONARY ARTERY BYPASS GRAFT  06/14/2011   Procedure: CORONARY ARTERY BYPASS GRAFTING (CABG);  Surgeon: Alleen Borne, MD;  Location: [facility] OR;  Service: Open Heart Surgery;  Laterality: N/A;  Coronary Artery Bypass Graft on pump times six;  utilizing internal mammary artery and right greater saphenous vein harvested endoscopically.x 5 vessels   ELECTROPHYSIOLOGY STUDY N/A 11/05/2012   Procedure: ELECTROPHYSIOLOGY STUDY;  Surgeon: Duke Salvia, MD;  Location: [facility] CATH LAB;  Service: Cardiovascular;  Laterality: N/A;   ESOPHAGOGASTRODUODENOSCOPY (EGD) WITH PROPOFOL N/A 02/17/2013   Procedure: ESOPHAGOGASTRODUODENOSCOPY (EGD) WITH PROPOFOL;  Surgeon: Beverley Fiedler, MD;  Location: [facility] ENDOSCOPY;  Service: Gastroenterology;  Laterality: N/A;    ESOPHAGOGASTRODUODENOSCOPY (EGD) WITH PROPOFOL N/A 03/17/2021   Procedure: ESOPHAGOGASTRODUODENOSCOPY (EGD) WITH PROPOFOL;  Surgeon: Iva Boop, MD;  Location: [facility] ENDOSCOPY;  Service: Gastroenterology;  Laterality: N/A;   FEMORAL-POPLITEAL BYPASS GRAFT Left 03/06/2012   Procedure: BYPASS GRAFT FEMORAL-POPLITEAL ARTERY;  Surgeon: Chuck Hint, MD;  Location: [facility] OR;  Service: Vascular;  Laterality: Left;  Left Femoral - Below Knee Popliteal Bypass Graft with Vein and Intraoperative Arteriogram.   IR PARACENTESIS  05/23/2022   JOINT REPLACEMENT Left    left TKA   KNEE ARTHROSCOPY Left    "I had 3" (11/05/2012)   LEAD REVISION  11/05/2012   pacemaker/notes 11/05/2012   LEAD REVISION N/A 11/05/2012   Procedure: LEAD REVISION;  Surgeon: Duke Salvia, MD;  Location: [facility] CATH LAB;  Service: Cardiovascular;  Laterality: N/A;   MAZE  06/14/2011   Procedure: MAZE;  Surgeon: Alleen Borne, MD;  Location: [facility] OR;  Service: Open Heart Surgery;  Laterality: N/A;  Ligate left atrial appendage   PERMANENT PACEMAKER INSERTION N/A 06/20/2011   Procedure: PERMANENT PACEMAKER INSERTION;  Surgeon: Duke Salvia, MD;  Location: [facility] CATH LAB;  Service: Cardiovascular;  Laterality: N/A;   POLYPECTOMY  03/18/2021   Procedure: POLYPECTOMY;  Surgeon: Jeani Hawking, MD;  Location: [facility] ENDOSCOPY;  Service: Gastroenterology;;   TONSILLECTOMY AND ADENOIDECTOMY     TOTAL KNEE ARTHROPLASTY Left      A IV Location/Drains/Wounds Patient Lines/Drains/Airways Status     Active Line/Drains/Airways     Name Placement date Placement time Site Days   Peripheral IV 05/25/22 22 G Anterior;Proximal;Right Forearm 05/25/22  0948  Forearm  less than 1   Wound / Incision (Open or Dehisced) 03/18/21 Skin tear Arm Posterior;Right;Upper 03/18/21  1940  Arm  433            Intake/Output Last 24 hours No intake or output data in the 24 hours ending 05/25/22 1354  Labs/Imaging Results for orders placed or performed during  the hospital encounter of 05/25/22 (from the past 48 hour(s))  Brain natriuretic peptide     Status: Abnormal   Collection Time: 05/25/22  9:45 AM  Result Value Ref Range   B Natriuretic Peptide 182.2 (H) 0.0 - 100.0 pg/mL    Comment: Performed at [facility] Lab, [address] [place], [place] [address]  Comprehensive metabolic panel     Status: Abnormal   Collection Time: 05/25/22  9:50 AM  Result Value Ref Range   Sodium 136 135 - 145 mmol/L   Potassium 4.0 3.5 - 5.1 mmol/L   Chloride 103 98 - 111 mmol/L   CO2 22 22 - 32 mmol/L   Glucose, Bld 94 70 - 99 mg/dL    Comment: Glucose reference range applies only to samples taken after fasting for at least 8 hours.   BUN 37 (H) 8 - 23 mg/dL   Creatinine, Ser 6.04 (H) 0.61 - 1.24 mg/dL   Calcium 8.4 (L) 8.9 - 10.3 mg/dL   Total Protein 7.2 6.5 -  8.1 g/dL   Albumin 3.0 (L) 3.5 - 5.0 g/dL   AST 25 15 - 41 U/L   ALT 12 0 - 44 U/L   Alkaline Phosphatase 119 38 - 126 U/L   Total Bilirubin 1.3 (H) 0.3 - 1.2 mg/dL   GFR, Estimated 21 (L) >60 mL/min    Comment: (NOTE) Calculated using the CKD-EPI Creatinine Equation (2021)    Anion gap 11 5 - 15    Comment: Performed at [facility] Lab, [address] [place], [place] [address]  CBC with Differential     Status: Abnormal   Collection Time: 05/25/22  9:50 AM  Result Value Ref Range   WBC 7.8 4.0 - 10.5 K/uL   RBC 4.39 4.22 - 5.81 MIL/uL   Hemoglobin 10.0 (L) 13.0 - 17.0 g/dL   HCT 19.1 (L) 47.8 - 29.5 %   MCV 82.0 80.0 - 100.0 fL   MCH 22.8 (L) 26.0 - 34.0 pg   MCHC 27.8 (L) 30.0 - 36.0 g/dL   RDW 62.1 (H) 30.8 - 65.7 %   Platelets 234 150 - 400 K/uL    Comment: REPEATED TO VERIFY   nRBC 0.0 0.0 - 0.2 %   Neutrophils Relative % 69 %   Neutro Abs 5.4 1.7 - 7.7 K/uL   Lymphocytes Relative 10 %   Lymphs Abs 0.8 0.7 - 4.0 K/uL   Monocytes Relative 15 %   Monocytes Absolute 1.2 (H) 0.1 - 1.0 K/uL   Eosinophils Relative 4 %   Eosinophils Absolute 0.3 0.0 - 0.5 K/uL    Basophils Relative 1 %   Basophils Absolute 0.1 0.0 - 0.1 K/uL   Immature Granulocytes 1 %   Abs Immature Granulocytes 0.05 0.00 - 0.07 K/uL    Comment: Performed at [facility] Lab, [address], [place], [place] [address]  Troponin I (High Sensitivity)     Status: None   Collection Time: 05/25/22  9:50 AM  Result Value Ref Range   Troponin I (High Sensitivity) 14 <18 ng/L    Comment: (NOTE) Elevated high sensitivity troponin I (hsTnI) values and significant  changes across serial measurements may suggest ACS but many other  chronic and acute conditions are known to elevate hsTnI results.  Refer to the "Links" section for chest pain algorithms and additional  guidance. Performed at [facility] Lab, [address], [place], [place] [address]   Protime-INR     Status: Abnormal   Collection Time: 05/25/22 10:51 AM  Result Value Ref Range   Prothrombin Time 19.4 (H) 11.4 - 15.2 seconds   INR 1.6 (H) 0.8 - 1.2    Comment: (NOTE) INR goal varies based on device and disease states. Performed at [facility] Lab, [address] [place], [place] [address]    DG Chest 2 View  Result Date: 05/25/2022 CLINICAL DATA:  Lower extremity swelling. EXAM: CHEST - 2 VIEW COMPARISON:  March 20, 2022. FINDINGS: Stable cardiomediastinal silhouette. Sternotomy wires are noted. Right lung is clear. Minimal left basilar subsegmental atelectasis and small pleural effusion is noted. Left-sided pacemaker is unchanged. Bony thorax is unremarkable. IMPRESSION: Minimal left basilar subsegmental atelectasis with small left pleural effusion. Electronically Signed   By: Lupita Raider M.D.   On: 05/25/2022 10:30    Pending Labs Unresulted Labs (From admission, onward)     Start     Ordered   05/26/22 0500  Comprehensive metabolic panel  Tomorrow morning,   R  05/25/22 1347   05/26/22 0500  CBC  Tomorrow morning,   R        05/25/22 1347            Vitals/Pain Today's Vitals    05/25/22 1215 05/25/22 1230 05/25/22 1245 05/25/22 1300  BP: 136/77 (!) 140/84 138/88 139/88  Pulse: 75 77 64 68  Resp: 16 15 10 14   Temp:      TempSrc:      SpO2: 100% 100% 100% 100%  Weight:      Height:      PainSc:        Isolation Precautions No active isolations  Medications Medications  furosemide (LASIX) injection 80 mg (has no administration in time range)    Mobility walks with device     Focused Assessments Pulmonary Assessment Handoff:  Lung sounds:          R Recommendations: See Admitting Provider Note  Report given to:   Additional Notes: Pt had recent paracentesis, axox4, room air

## 2022-05-25 NOTE — Progress Notes (Signed)
Patient was able to void post foley removal at 1600.

## 2022-05-25 NOTE — Progress Notes (Signed)
error 

## 2022-05-25 NOTE — ED Triage Notes (Signed)
Pt came to ED for swollen testicles for 1 day. Axox4. Pt denies pain. Hx of CHF.

## 2022-05-25 NOTE — Consult Note (Signed)
Consultation Note Date: 05/25/2022   Patient Name: Edward [name]  DOB: Oct 26, 1940  MRN: [id number]  Age / Sex: 82 y.o., male  PCP: [facility] Referring Physician: Ginnie Smart, MD  Reason for Consultation: Establishing goals of care  HPI/Patient Profile: 82 y.o. male  with past medical history of DM, CKD3, bladder cancer, afib/CABG/pacer/CHF, PAD, and fatty liver, admitted on 05/25/2022 with testicular swelling and recurrent ascites.   Patient being admitted for further workup of scrotal edema and likely worsening of cirrhosis. PMT has been consulted to assist with goals of care conversation.  Clinical Assessment and Goals of Care:  I have reviewed medical records including EPIC notes, labs and imaging, received report from RN, assessed the patient and then met at the bedside with his wife Mimi to discuss diagnosis prognosis, GOC, EOL wishes, disposition and options.  I introduced Palliative Medicine as specialized medical care for people living with serious illness. It focuses on providing relief from the symptoms and stress of a serious illness. The goal is to improve quality of life for both the patient and the family.  We discussed a brief life review of the patient and then focused on their current illness.   I attempted to elicit values and goals of care important to the patient.    Medical History Review and Understanding:  Reviewed patient acute illness in the context of his chronic comorbidities. Discussed concern for worsening cirrhosis. Patient verbalizes his understanding of this possibility.   Social History: Patient has been married for 51 years.  Palliative Symptoms: Pain s/p foley catheter insertion  Discussion: Patient tells me "the doctors aren't real sure" about what is going on with him. He states "we aren't there yet though, right?" when I explained palliative's  role in assisting patients and families navigate difficult conversations. Reviewed his labs at wife's request. Patient is having a hard time focusing on our conversation due to pain from his foley catheter described as "burning." Coordinated with MD and RN to advocate for patient's preference to use a urinal as needed. He is very appreciative of this. He is also quite hungry/thirsty and I brought him a meal from the ED stock. Patient is open to ongoing GOC discussions pending further clinical findings.     Discussed the importance of continued conversation with family and the medical providers regarding overall plan of care and treatment options, ensuring decisions are within the context of the patient's values and GOCs.   Questions and concerns were addressed.  Hard Choices booklet left for review. The family was encouraged to call with questions or concerns.  PMT will continue to support holistically.   SUMMARY OF RECOMMENDATIONS   -Continue full code/full scope treatment -Patient seems surprised to hear that he has high risk for decline and disease progression -Ongoing goals of care discussions pending clinical course -Psychosocial and emotional support provided -PMT will continue to follow and support  Prognosis:  Unable to determine  Discharge Planning: To Be Determined      Primary Diagnoses: Present on Admission:  Decompensated hepatic cirrhosis (HCC)   Physical Exam Constitutional:      General: He is not in acute distress.    Appearance: He is ill-appearing.  Cardiovascular:     Rate and Rhythm: Normal rate.  Pulmonary:     Effort: Pulmonary effort is normal.  Abdominal:     General: There is distension.  Neurological:     Mental Status: He is alert and oriented to person, place, and  time.  Psychiatric:        Mood and Affect: Mood normal.        Behavior: Behavior normal.     Vital Signs: BP (!) 143/71 (BP Location: Right Arm)   Pulse 67   Temp (!) 97.5 F  (36.4 C) (Oral)   Resp 16   Ht 5\' 10"  (1.778 m)   Wt 104.3 kg   SpO2 100%   BMI 33.00 kg/m  Pain Scale: 0-10   Pain Score: 0-No pain   SpO2: SpO2: 100 % O2 Device:SpO2: 100 % O2 Flow Rate: .    Palliative Assessment/Data: TBD     MDM: High   Justyne Roell Jeni Salles, PA-C  Palliative Medicine Team Team phone # [phone]  Thank you for allowing the Palliative Medicine Team to assist in the care of this patient. Please utilize secure chat with additional questions, if there is no response within 30 minutes please call the above phone number.  Palliative Medicine Team providers are available by phone from 7am to 7pm daily and can be reached through the team cell phone.  Should this patient require assistance outside of these hours, please call the patient's attending physician.

## 2022-05-25 NOTE — Progress Notes (Signed)
Attending contacted via secure chat to verify need for telemetry or not.

## 2022-05-25 NOTE — Hospital Course (Addendum)
Scrotal edema s/p paracentesis  Cirrhosis (NASH)  Chronic diastolic heart failure Patient presented with sudden-onset scrotal edema without pain after receiving paracentesis on 5/8 with 4L drained. He had difficulty urinating so presented to [facility] urgent clinic then was told to go to ER. Initial evaluation showed BNP at 182. Likely edema is multi-factorial due to decompensated cirrhosis and heart failure. Edema improved with IV lasix. Ultrasound of abd showed small amount of ascites not amenable to repeat paracentesis. With improvement in renal function spironolactone was restarted. He was discharged back on home dosing of lasix with instructions to elevated scrotum and follow-up with nephology and PCP in 1-2 weeks.Patient may benefit from GI evaluation in outpatient for TIPS procedure -- he has no signs of encephalopathy.    Microcytic anemia  History of IDA. Hgb decreased (10.0>9.1) but remains near baseline.    CAD, s/p CABG  Hypertension  Patient's most recent ECHO (03/2022) demonstrated a normal EF (55-60%) without signs of diastolic dysfunction.    CKD IV  Creatinine improved from recent admission 3/24. Cr 2.92>2.82 during admission. He has follow-up with nephrology at [facility] next week.   Type II DM  Last A1c of 6.1, ~2 months ago, at goal. Glargine 48 units continued.   A-fib  Sick sinus syndrome, s/p pacemaker  ECG consistent with long-standing a-fib. Apixaban continued.

## 2022-05-25 NOTE — ED Provider Notes (Signed)
[facility] EMERGENCY DEPARTMENT AT [facility] Provider Note   CSN: [id number] Arrival date & time: 05/25/22  6578     History  Chief Complaint  Patient presents with   Groin Swelling    Edward [name] is a 82 y.o. male.  HPI 82 year old male with multiple comorbidities which include diastolic CHF, cirrhosis, paroxysmal A-fib, type 2 diabetes, CKD and multiple other comorbidities presents with swelling.  He had a paracentesis 2 days ago.  Since then he has noticed scrotal swelling that started yesterday and then has gotten worse today.  He is able to urinate but his scrotum has become diffusely swollen.  He has leg swelling but he does not feel like that is worse than baseline.  His abdomen has become more swollen as well.  There is no abdominal pain and he does not feel short of breath though he has a little bit of wheezing from time to time.  No chest pain.  Scrotum does not hurt but is very swollen.  Home Medications Prior to Admission medications   Medication Sig Start Date End Date Taking? Authorizing Provider  acetaminophen (TYLENOL) 650 MG CR tablet Take 650 mg by mouth every 8 (eight) hours as needed for pain.   Yes [provider]  allopurinol (ZYLOPRIM) 100 MG tablet Take 50 mg by mouth daily.   Yes [provider]  apixaban (ELIQUIS) 2.5 MG TABS tablet Take 2.5 mg by mouth 2 (two) times daily.   Yes [provider]  ascorbic acid (VITAMIN C) 500 MG tablet Take 1,000 mg by mouth every Monday, Wednesday, and Friday.   Yes [provider]  carvedilol (COREG) 6.25 MG tablet Take 1 tablet (6.25 mg total) by mouth 2 (two) times daily. 02/24/19  Yes Nahser, Deloris Ping, MD  cetirizine (ZYRTEC) 10 MG tablet Take 10 mg by mouth daily.   Yes [provider]  cholecalciferol (VITAMIN D3) 25 MCG (1000 UNIT) tablet Take 2,000 Units by mouth daily.   Yes [provider]  cyanocobalamin 1000 MCG tablet Take 1,000 mcg by mouth  daily.   Yes [provider]  doxycycline (ADOXA) 100 MG tablet Take 100 mg by mouth 2 (two) times daily.   Yes [provider]  ferrous sulfate 325 (65 FE) MG tablet Take 650 mg by mouth every Monday, Wednesday, and Friday.   Yes [provider]  furosemide (LASIX) 40 MG tablet TAKE 2 TABLETS BY MOUTH EVERY MORNING AND TAKE 1 TABLET IN THE AFTERNOON Patient taking differently: Take 60-80 mg by mouth See admin instructions. Take 4 tablets by mouth in the morning and 3 tablets in the evening 03/18/18  Yes Nahser, Deloris Ping, MD  gabapentin (NEURONTIN) 100 MG capsule Take 200-300 mg by mouth See admin instructions. Take 2 capsules by mouth in the morning and 3 capsules in the evening   Yes [provider]  insulin glargine (LANTUS) 100 UNIT/ML injection Inject 48 Units into the skin in the morning.   Yes [provider]  isosorbide mononitrate (IMDUR) 30 MG 24 hr tablet Take 30 mg by mouth daily.   Yes [provider]  levothyroxine (SYNTHROID) 25 MCG tablet Take 25 mcg by mouth daily before breakfast.   Yes [provider]  Magnesium Oxide 420 MG TABS Take 2 tablets by mouth at bedtime.   Yes [provider]  midodrine (PROAMATINE) 5 MG tablet Take 5 mg by mouth 3 (three) times daily with meals.   Yes [provider]  Multiple Vitamin (MULTIVITAMIN) capsule Take 1 capsule by mouth daily.   Yes [provider]  pantoprazole (PROTONIX) 40 MG tablet Take 40 mg by mouth daily.   Yes [provider]  polyvinyl alcohol (LIQUIFILM TEARS) 1.4 % ophthalmic solution Place 1 drop into both eyes as needed for dry eyes. 03/25/22  Yes [name], MD  potassium chloride (KLOR-CON M) 10 MEQ tablet Take 10 mEq by mouth daily.   Yes [provider]  Semaglutide, 1 MG/DOSE, 2 MG/1.5ML SOPN Inject 1 mg into the skin every Sunday.   Yes [provider]  spironolactone (ALDACTONE) 25 MG tablet Take 1 tablet  (25 mg total) by mouth daily. 03/25/22  Yes [name], MD  testosterone cypionate (DEPOTESTOSTERONE CYPIONATE) 200 MG/ML injection Inject 200 mg into the muscle every 14 (fourteen) days. Given every other Sunday   Yes [provider]  valsartan (DIOVAN) 80 MG tablet Take 80 mg by mouth daily.   Yes [provider]  Zinc 50 MG TABS Take 1 tablet (50 mg total) by mouth daily. 03/25/22  Yes [name], MD  albuterol (PROVENTIL HFA;VENTOLIN HFA) 108 (90 Base) MCG/ACT inhaler USE 2 PUFFS EVERY 4 HOURS AS NEEDED FOR WHEEZING Patient not taking: Reported on 03/20/2022 11/11/17   Nahser, Deloris Ping, MD  blood glucose meter kit and supplies Dispense based on patient and insurance preference. Use up to four times daily as directed. (FOR ICD-10 E10.9, E11.9). Patient not taking: Reported on 05/25/2022 02/18/17   Bing Neighbors, NP      Allergies    Lipitor [atorvastatin calcium]    Review of Systems   Review of Systems  Respiratory:  Negative for shortness of breath.   Cardiovascular:  Positive for leg swelling. Negative for chest pain.  Gastrointestinal:  Positive for abdominal distention. Negative for abdominal pain.  Genitourinary:  Positive for scrotal swelling. Negative for difficulty urinating.    Physical Exam Updated Vital Signs BP (!) 143/82   Pulse 80   Temp 98 F (36.7 C) (Oral)   Resp 11   Ht 5\' 10"  (1.778 m)   Wt 104.3 kg   SpO2 99%   BMI 33.00 kg/m  Physical Exam Vitals and nursing note reviewed. Exam conducted with a chaperone present.  Constitutional:      General: He is not in acute distress.    Appearance: He is well-developed. He is not ill-appearing or diaphoretic.  HENT:     Head: Normocephalic and atraumatic.  Cardiovascular:     Rate and Rhythm: Normal rate and regular rhythm.     Heart sounds: Normal heart sounds.  Pulmonary:     Effort: Pulmonary effort is normal.     Breath sounds: Normal breath sounds. No wheezing or rales.  Abdominal:      General: There is distension (mild to moderate. abdomen is still quite soft).     Palpations: Abdomen is soft.     Tenderness: There is no abdominal tenderness.  Genitourinary:    Comments: Patient has diffuse and significant scrotal swelling/edema.  His glans penis is visible and nonobstructed. Musculoskeletal:     Right lower leg: Edema present.     Left lower leg: Edema present.     Comments: Patient has significant bilateral leg edema.  This is primarily in the lower legs though there is some pitting edema going all the way up to his proximal thighs  Skin:    General: Skin is warm and dry.  Neurological:  Mental Status: He is alert.     ED Results / Procedures / Treatments   Labs (all labs ordered are listed, but only abnormal results are displayed) Labs Reviewed  COMPREHENSIVE METABOLIC PANEL - Abnormal; Notable for the following components:      Result Value   BUN 37 (*)    Creatinine, Ser 2.92 (*)    Calcium 8.4 (*)    Albumin 3.0 (*)    Total Bilirubin 1.3 (*)    GFR, Estimated 21 (*)    All other components within normal limits  BRAIN NATRIURETIC PEPTIDE - Abnormal; Notable for the following components:   B Natriuretic Peptide 182.2 (*)    All other components within normal limits  CBC WITH DIFFERENTIAL/PLATELET - Abnormal; Notable for the following components:   Hemoglobin 10.0 (*)    HCT 36.0 (*)    MCH 22.8 (*)    MCHC 27.8 (*)    RDW 20.5 (*)    Monocytes Absolute 1.2 (*)    All other components within normal limits  PROTIME-INR - Abnormal; Notable for the following components:   Prothrombin Time 19.4 (*)    INR 1.6 (*)    All other components within normal limits  TROPONIN I (HIGH SENSITIVITY)  TROPONIN I (HIGH SENSITIVITY)    EKG EKG Interpretation  Date/Time:  Friday May 25 2022 09:38:30 EDT Ventricular Rate:  77 PR Interval:    QRS Duration: 106 QT Interval:  395 QTC Calculation: 447 R Axis:   109 Text Interpretation: Atrial  fibrillation Aberrant complex Right axis deviation Borderline low voltage, extremity leads Borderline repolarization abnormality Confirmed by Pricilla Loveless [id number]) on 05/25/2022 9:39:55 AM  Radiology DG Chest 2 View  Result Date: 05/25/2022 CLINICAL DATA:  Lower extremity swelling. EXAM: CHEST - 2 VIEW COMPARISON:  March 20, 2022. FINDINGS: Stable cardiomediastinal silhouette. Sternotomy wires are noted. Right lung is clear. Minimal left basilar subsegmental atelectasis and small pleural effusion is noted. Left-sided pacemaker is unchanged. Bony thorax is unremarkable. IMPRESSION: Minimal left basilar subsegmental atelectasis with small left pleural effusion. Electronically Signed   By: Lupita Raider M.D.   On: 05/25/2022 10:30    Procedures Ultrasound ED Abd  Date/Time: 05/25/2022 10:27 AM  Performed by: Pricilla Loveless, MD Authorized by: Pricilla Loveless, MD   Procedure details:    Indications comment:  Location of ascites   Assessment for:  Intra-abdominal fluid    Images: archived    Comments:     Positive for moderate ascites     Medications Ordered in ED Medications  furosemide (LASIX) injection 80 mg (80 mg Intravenous Given 05/25/22 1400)    ED Course/ Medical Decision Making/ A&P                             Medical Decision Making Amount and/or Complexity of Data Reviewed Labs: ordered.    Details: Kidney function a little better than before.  BNP is elevated a little more than it was last time. Radiology: ordered.    Details: No significant CHF ECG/medicine tests: ordered and independent interpretation performed.    Details: A-fib  Risk Prescription drug management. Decision regarding hospitalization.   Patient presents with significant scrotal swelling.  He also has swelling in other places and is having recurrent ascites.  I suspect he has anasarca, probably from cirrhosis though could also be from CHF.  He does not need an emergent Foley catheter at this  moment as he  is still able to urinate on his own.  Will need diuresis and I have started with IV Lasix.  Consulted internal medicine teaching service for admission.        Final Clinical Impression(s) / ED Diagnoses Final diagnoses:  Anasarca    Rx / DC Orders ED Discharge Orders     None         Pricilla Loveless, MD 05/25/22 1436

## 2022-05-26 ENCOUNTER — Other Ambulatory Visit: Payer: Self-pay | Admitting: Student

## 2022-05-26 ENCOUNTER — Inpatient Hospital Stay (HOSPITAL_COMMUNITY): Payer: No Typology Code available for payment source

## 2022-05-26 DIAGNOSIS — K7290 Hepatic failure, unspecified without coma: Secondary | ICD-10-CM | POA: Diagnosis not present

## 2022-05-26 LAB — CBC
HCT: 32.1 % — ABNORMAL LOW (ref 39.0–52.0)
Hemoglobin: 9.1 g/dL — ABNORMAL LOW (ref 13.0–17.0)
MCH: 22.3 pg — ABNORMAL LOW (ref 26.0–34.0)
MCHC: 28.3 g/dL — ABNORMAL LOW (ref 30.0–36.0)
MCV: 78.7 fL — ABNORMAL LOW (ref 80.0–100.0)
Platelets: 216 10*3/uL (ref 150–400)
RBC: 4.08 MIL/uL — ABNORMAL LOW (ref 4.22–5.81)
RDW: 20 % — ABNORMAL HIGH (ref 11.5–15.5)
WBC: 7.3 10*3/uL (ref 4.0–10.5)
nRBC: 0 % (ref 0.0–0.2)

## 2022-05-26 LAB — COMPREHENSIVE METABOLIC PANEL
ALT: 11 U/L (ref 0–44)
AST: 21 U/L (ref 15–41)
Albumin: 2.6 g/dL — ABNORMAL LOW (ref 3.5–5.0)
Alkaline Phosphatase: 102 U/L (ref 38–126)
Anion gap: 9 (ref 5–15)
BUN: 36 mg/dL — ABNORMAL HIGH (ref 8–23)
CO2: 23 mmol/L (ref 22–32)
Calcium: 8.1 mg/dL — ABNORMAL LOW (ref 8.9–10.3)
Chloride: 104 mmol/L (ref 98–111)
Creatinine, Ser: 2.82 mg/dL — ABNORMAL HIGH (ref 0.61–1.24)
GFR, Estimated: 22 mL/min — ABNORMAL LOW (ref >60)
Glucose, Bld: 116 mg/dL — ABNORMAL HIGH (ref 70–99)
Potassium: 3.6 mmol/L (ref 3.5–5.1)
Sodium: 136 mmol/L (ref 135–145)
Total Bilirubin: 1.4 mg/dL — ABNORMAL HIGH (ref 0.3–1.2)
Total Protein: 6.2 g/dL — ABNORMAL LOW (ref 6.5–8.1)

## 2022-05-26 LAB — GLUCOSE, CAPILLARY
Glucose-Capillary: 101 mg/dL — ABNORMAL HIGH (ref 70–99)
Glucose-Capillary: 141 mg/dL — ABNORMAL HIGH (ref 70–99)

## 2022-05-26 MED ORDER — SPIRONOLACTONE 25 MG PO TABS
25.0000 mg | ORAL_TABLET | Freq: Every day | ORAL | Status: DC
  Administered 2022-05-26: 25 mg via ORAL
  Filled 2022-05-26: qty 1

## 2022-05-26 MED ORDER — SPIRONOLACTONE 25 MG PO TABS: 25.0000 mg | ORAL_TABLET | Freq: Every day | ORAL | 0 refills | Status: DC

## 2022-05-26 MED ORDER — FUROSEMIDE 40 MG PO TABS: 60.0000 mg | ORAL_TABLET | ORAL | 0 refills | Status: DC

## 2022-05-26 MED ORDER — FUROSEMIDE 10 MG/ML IJ SOLN
80.0000 mg | Freq: Once | INTRAMUSCULAR | Status: AC
  Administered 2022-05-26: 80 mg via INTRAVENOUS
  Filled 2022-05-26: qty 8

## 2022-05-26 NOTE — Progress Notes (Addendum)
Subjective:  Edward [name] is a 82 y.o. person living with a history of cirrhosis (NASH) and bladder cancer who presents with sudden scrotal edema after recent paracentesis, admitted for medical management. .  Currently, the patient is doing well. No scrotal pain noted. Reports that urinating remains uncomfortable without the foley catheter. Per patient's wife, several medication changes were made to his regimen in recent months. Namely, patient stopped taking spironolactone and valsartan ~6 months ago and has noticed increased ascites since. He is not taking midodrine.  Interval Events: - Able to void without foley    Objective:  Vital Signs:   Temp: 98.2 F (36.8 C)  Pulse Rate:  [64-80] 79  Resp:  [10-28] 18  BP: (107-150)/(60-90) 107/60  SpO2:  [97 %-100 %] 97 %  Weight:  [104.3 kg-104.6 kg] 104.6 kg (no change) Last BM Date : 05/24/22   Intake/Output:   Intake/Output Summary (Last 24 hours) at 05/26/2022 0558 Last data filed at 05/26/2022 0416 Gross per 24 hour  Intake --  Output 1700 ml  Net -1700 ml      Physical Exam: General: Vital signs reviewed and noted. Well-developed, well-nourished, in no acute distress; alert, appropriate and cooperative throughout examination.  Lungs:  Normal respiratory effort on RA  Heart: RRR. S1 and S2 normal without gallop, murmur, or rubs.  Abdomen:  Soft, non-tender; mildly distended   GU Moderate uniform scrotal swelling with erythema, no lumps    Extremities: 1+ pitting edema, bilateral lower extremities.     Labs:  Basic Metabolic Panel: Recent Labs  Lab 05/25/22 0950 05/26/22 0112  NA 136 136  K 4.0 3.6  CL 103 104  CO2 22 23  GLUCOSE 94 116*  BUN 37* 36*  CREATININE 2.92* 2.82*  CALCIUM 8.4* 8.1*    Liver Function Tests: Recent Labs  Lab 05/25/22 0950 05/26/22 0112  AST 25 21  ALT 12 11  ALKPHOS 119 102  BILITOT 1.3* 1.4*  PROT 7.2 6.2*  ALBUMIN 3.0* 2.6*    CBC: Recent Labs  Lab  05/25/22 0950 05/26/22 0112  WBC 7.8 7.3  NEUTROABS 5.4  --   HGB 10.0* 9.1*  HCT 36.0* 32.1*  MCV 82.0 78.7*  PLT 234 216    CBG: Recent Labs  Lab 05/25/22 1631 05/25/22 2211  GLUCAP 97 155*     Assessment & Plan:  Edward [name] is a 82 y.o. person living with a history of cirrhosis (NASH) and bladder cancer who presents with sudden scrotal edema after recent paracentesis, admitted for medical management.   #Sudden-onset scrotal edema s/p paracentesis  Cirrhosis (NASH)  On exam, patient's scrotal edema appears to have improved. Patient was evaluated by IR for possible therapeutic paracentesis, but exam demonstrated insufficient fluid to perform procedure. Patient's recent medication changes, particularly discontinuation of spirolactone, has likely contributed to worsening ascites/fluid retention. There is also concern for a worsening of his long-standing cirrhosis given need for more frequent paracentesis. Patient may benefit from GI evaluation in outpatient for TIPS procedure -- he has no signs of encephalopathy. Also, given predominant scrotal edema, it is possible that patient's presentation could be explained by fistula formation as a complication of most recent paracentesis.  - IV Furosemide 80 mg daily  - Continue midodrine 5 mg PO TID - Start spironolactone 25 mg PO daily  - Palliative following  #Microcytic anemia  History of IDA. Hgb decreased (10.0>9.1) but remains near baseline.  - Iron supplementation if continues to down-trend   #  Chronic diastolic heart failure  CAD, s/p CABG  #Hypertension  Patient's most recent ECHO (03/2022) demonstrated a normal EF (55-60%) without signs of diastolic dysfunction. Patient's lower-extremity edema is at baseline and he has no signs of pulmonary edema.  - Home coreg 6.25 mg PO BID  - Strict I/Os  - Daily weights   #CKD IV  Stable. Cr 2.92>2.82. Appears to have improved in recent months from Cr ~5.0. Now within baseline range  of 2-3. Will need to renally dose home medications, however. - Daily BMP    #Type II DM  Last A1c of 6.1 collected ~2 months ago, at goal. However, patient has signs of diabetic neuropathy with decreased sensation to fine touch of the lower extremities. No plantar ulcers noted.   - Home glargine 48 units  - Start SSI  - Home gabapentin 200 mg PO daily (AM)  - Home gabapentin 300 mg PO daily (PM)   #A-fib  Sick sinus syndrome, s/p pacemaker  ECG consistent with long-standing a-fib. No ischemic changes. No changes from prior.  - Home eliquis 2.5 mg PO BID   Length of Stay: 1 day(s)  Signed:  Lyda Kalata, Medical Student 05/26/2022, 5:58 AM

## 2022-05-26 NOTE — Plan of Care (Signed)

## 2022-05-26 NOTE — Progress Notes (Signed)
Daily Progress Note   Patient Name: Edward [name]       Date: 05/26/2022 DOB: 1940-02-07  Age: 82 y.o. MRN#: [id number] Attending Physician: Gust Rung, DO Primary Care Physician: [facility] Admit Date: 05/25/2022  Reason for Consultation/Follow-up: Establishing goals of care  Subjective: Medical records reviewed including progress notes, labs, and imaging. Patient assessed at the bedside. He reports feeling better and relieved that he can go home today. His swelling has improved. His wife is present visiting.  Patient is relieved that he can try medication adjustments and that his liver disease may not have progressed as fast as the team was initially concerned. He understands this remains a risk for him at any time. They are focusing on moving forward one day and one step at a time. Short term goal is to return home and long term goal is to "feel better." He is motivated to improve and enjoy visits from his son and grandson this summer.   Questions and concerns addressed. PMT will continue to support holistically.   Length of Stay: 1  Physical Exam Vitals and nursing note reviewed.  Constitutional:      General: He is not in acute distress. Cardiovascular:     Rate and Rhythm: Normal rate.  Pulmonary:     Effort: Pulmonary effort is normal.  Abdominal:     General: There is distension.  Neurological:     Mental Status: He is alert and oriented to person, place, and time.  Psychiatric:        Mood and Affect: Mood normal.        Behavior: Behavior normal.            Vital Signs: BP 120/63 (BP Location: Left Arm)   Pulse 78   Temp 98.2 F (36.8 C) (Oral)   Resp 18   Ht 5\' 10"  (1.778 m)   Wt 104.6 kg   SpO2 99%   BMI 33.09 kg/m  SpO2: SpO2: 99 % O2  Device: O2 Device: Room Air O2 Flow Rate:    Palliative Care Assessment & Plan   Patient Profile: 82 y.o. male  with past medical history of DM, CKD3, bladder cancer, afib/CABG/pacer/CHF, PAD, and fatty liver, admitted on 05/25/2022 with testicular swelling and recurrent ascites.    Patient being admitted for further  workup of scrotal edema and likely worsening of cirrhosis. PMT has been consulted to assist with goals of care conversation.  Assessment: Goals of care conversation Cirrhosis Scrotal edema s/p paracentesis   Recommendations/Plan: Continue full code/full scope treatment  Goals of care are clear for improvement and return home Psychosocial and emotional support provided PMT remains available as needed   Prognosis:  Unable to determine  Discharge Planning: Home  Care plan was discussed with patient, patient's wife   Total time: I spent 35 minutes in the care of the patient today in the above activities and documenting the encounter.         Nahom Carfagno Jeni Salles, PA-C  Palliative Medicine Team Team phone # [phone]  Thank you for allowing the Palliative Medicine Team to assist in the care of this patient. Please utilize secure chat with additional questions, if there is no response within 30 minutes please call the above phone number.  Palliative Medicine Team providers are available by phone from 7am to 7pm daily and can be reached through the team cell phone.  Should this patient require assistance outside of these hours, please call the patient's attending physician.

## 2022-05-26 NOTE — Discharge Summary (Cosign Needed Addendum)
Name: Edward [name] MRN: [id number] DOB: [birth year] 82 y.o. PCP: [facility]  Date of Admission: 05/25/2022  9:26 AM Date of Discharge: 05/26/22 Attending Physician: Dr. Cleda Daub  Discharge Diagnosis: Principal Problem:   Decompensated hepatic cirrhosis [facility])    Discharge Medications: Allergies as of 05/26/2022       Reactions   Lipitor [atorvastatin Calcium] Other (See Comments)   Weakness/ pain in legs        Medication List     STOP taking these medications    blood glucose meter kit and supplies   doxycycline 100 MG tablet Commonly known as: ADOXA   midodrine 5 MG tablet Commonly known as: PROAMATINE   potassium chloride 10 MEQ tablet Commonly known as: KLOR-CON M   valsartan 80 MG tablet Commonly known as: DIOVAN       TAKE these medications    acetaminophen 650 MG CR tablet Commonly known as: TYLENOL Take 650 mg by mouth every 8 (eight) hours as needed for pain.   albuterol 108 (90 Base) MCG/ACT inhaler Commonly known as: VENTOLIN HFA USE 2 PUFFS EVERY 4 HOURS AS NEEDED FOR WHEEZING   allopurinol 100 MG tablet Commonly known as: ZYLOPRIM Take 50 mg by mouth daily.   apixaban 2.5 MG Tabs tablet Commonly known as: ELIQUIS Take 2.5 mg by mouth 2 (two) times daily.   ascorbic acid 500 MG tablet Commonly known as: VITAMIN C Take 1,000 mg by mouth every Monday, Wednesday, and Friday.   carvedilol 6.25 MG tablet Commonly known as: COREG Take 1 tablet (6.25 mg total) by mouth 2 (two) times daily.   cetirizine 10 MG tablet Commonly known as: ZYRTEC Take 10 mg by mouth daily.   cholecalciferol 25 MCG (1000 UNIT) tablet Commonly known as: VITAMIN D3 Take 2,000 Units by mouth daily.   cyanocobalamin 1000 MCG tablet Take 1,000 mcg by mouth daily.   ferrous sulfate 325 (65 FE) MG tablet Take 650 mg by mouth every Monday, Wednesday, and Friday.   furosemide 40 MG tablet Commonly known as: LASIX Take 1.5-2 tablets (60-80 mg  total) by mouth See admin instructions. Take 2 tablets by mouth in the morning and 1.5 tablets in the evening What changed:  how much to take how to take this when to take this additional instructions   gabapentin 100 MG capsule Commonly known as: NEURONTIN Take 200-300 mg by mouth See admin instructions. Take 2 capsules by mouth in the morning and 3 capsules in the evening   insulin glargine 100 UNIT/ML injection Commonly known as: LANTUS Inject 48 Units into the skin in the morning.   isosorbide mononitrate 30 MG 24 hr tablet Commonly known as: IMDUR Take 30 mg by mouth daily.   levothyroxine 25 MCG tablet Commonly known as: SYNTHROID Take 25 mcg by mouth daily before breakfast.   Magnesium Oxide 420 MG Tabs Take 2 tablets by mouth at bedtime.   multivitamin capsule Take 1 capsule by mouth daily.   pantoprazole 40 MG tablet Commonly known as: PROTONIX Take 40 mg by mouth daily.   polyvinyl alcohol 1.4 % ophthalmic solution Commonly known as: LIQUIFILM TEARS Place 1 drop into both eyes as needed for dry eyes.   Semaglutide (1 MG/DOSE) 2 MG/1.5ML Sopn Inject 1 mg into the skin every Sunday.   spironolactone 25 MG tablet Commonly known as: ALDACTONE Take 1 tablet (25 mg total) by mouth daily.   testosterone cypionate 200 MG/ML injection Commonly known as: DEPOTESTOSTERONE CYPIONATE Inject 200 mg  into the muscle every 14 (fourteen) days. Given every other Sunday   Zinc 50 MG Tabs Take 1 tablet (50 mg total) by mouth daily.        Disposition and follow-up:   Mr.Caison L Alessandrini was discharged from [facility] in Good condition.  At the hospital follow up visit please address:  1.  Follow-up: a. Scrotal edema- he was given IV diuresis with improvement in swelling. Instructed to elevate and continue home furosemide.   b. Cirrhosis- if paracentesis becoming more frequent would consider going back to GI to discuss TIPs. Spironolactone was  restarted with improvement in renal function.  c. CKD stage 4- creatinine improved from 3/24  2.  Labs / imaging needed at time of follow-up: none  3.  Pending labs/ test needing follow-up: BMP, CBC  4.  Medication Changes Restarted spironolactone Discontinued midodrine and valsartan  Follow-up Appointments: f/u with nephrology next week and PCP within 1-2 weeks  Hospital Course by problem list: Scrotal edema s/p paracentesis  Cirrhosis ([facility])  Chronic diastolic heart failure Patient presented with sudden-onset scrotal edema without pain after receiving paracentesis on 5/8 with 4L drained. He had difficulty urinating so presented to [facility] urgent clinic then was told to go to ER. Initial evaluation showed BNP at 182. Likely edema is multi-factorial due to decompensated cirrhosis and heart failure. Edema improved with IV lasix. Ultrasound of abd showed small amount of ascites not amenable to repeat paracentesis. With improvement in renal function spironolactone was restarted. He was discharged back on home dosing of lasix with instructions to elevated scrotum and follow-up with nephology and PCP in 1-2 weeks.Patient may benefit from GI evaluation in outpatient for TIPS procedure -- he has no signs of encephalopathy.    Microcytic anemia  History of IDA. Hgb decreased (10.0>9.1) but remains near baseline.    CAD, s/p CABG  Hypertension  Patient's most recent ECHO (03/2022) demonstrated a normal EF (55-60%) without signs of diastolic dysfunction.    CKD IV  Creatinine improved from recent admission 3/24. Cr 2.92>2.82 during admission. He has follow-up with nephrology at [facility] next week.   Type II DM  Last A1c of 6.1, ~2 months ago, at goal. Glargine 48 units continued.   A-fib  Sick sinus syndrome, s/p pacemaker  ECG consistent with long-standing a-fib. Apixaban continued.   Discharge Subjective: Patient evaluated at bedside this AM. He reports that swelling has improved and he has been  able to pee on his own. He denies scrotal pain. We discussed medications again and his wife confirmed that he stopped spironolactone and valsartan several months ago. He is not taking midodrine.  Discharge Exam:   BP 120/63 (BP Location: Left Arm)   Pulse 78   Temp 98.2 F (36.8 C) (Oral)   Resp 18   Ht 5\' 10"  (1.778 m)   Wt 104.6 kg   SpO2 99%   BMI 33.09 kg/m  Constitutional: well-appearing, in no acute distress Cardiovascular: regular rate and rhythm, no m/r/g Pulmonary/Chest: normal work of breathing on room air, lungs clear to auscultation bilaterally Abdominal: soft, non-tender, distended, bowel sounds present MSK: 1+ pitting edema to knees bilaterally Neurological: alert & oriented x 3  Pertinent Labs, Studies, and Procedures:     Latest Ref Rng & Units 05/26/2022    1:12 AM 05/25/2022    9:50 AM 03/23/2022   12:36 AM  CBC  WBC 4.0 - 10.5 K/uL 7.3  7.8  6.7   Hemoglobin 13.0 - 17.0 g/dL  9.1  10.0  9.4   Hematocrit 39.0 - 52.0 % 32.1  36.0  31.2   Platelets 150 - 400 K/uL 216  234  123        Latest Ref Rng & Units 05/26/2022    1:12 AM 05/25/2022    9:50 AM 03/25/2022   12:34 AM  CMP  Glucose 70 - 99 mg/dL 161  94  096   BUN 8 - 23 mg/dL 36  37  70   Creatinine 0.61 - 1.24 mg/dL 0.45  4.09  8.11   Sodium 135 - 145 mmol/L 136  136  135   Potassium 3.5 - 5.1 mmol/L 3.6  4.0  3.7   Chloride 98 - 111 mmol/L 104  103  100   CO2 22 - 32 mmol/L 23  22  24    Calcium 8.9 - 10.3 mg/dL 8.1  8.4  8.7   Total Protein 6.5 - 8.1 g/dL 6.2  7.2    Total Bilirubin 0.3 - 1.2 mg/dL 1.4  1.3    Alkaline Phos 38 - 126 U/L 102  119    AST 15 - 41 U/L 21  25    ALT 0 - 44 U/L 11  12      [place] ASCITES (ABDOMEN LIMITED)  Result Date: 05/26/2022 CLINICAL DATA:  Evaluate for ascites EXAM: LIMITED ABDOMEN ULTRASOUND FOR ASCITES TECHNIQUE: Limited ultrasound survey for ascites was performed in all four abdominal quadrants. COMPARISON:  None Available. FINDINGS: No ascites in the right upper  quadrant identified. Mild ascites in the right and left lower quadrants with small pockets. IMPRESSION: Mild ascites in the right and left lower quadrants with small pockets. Electronically Signed   By: Gerome Sam III M.D.   On: 05/26/2022 09:39   DG Chest 2 View  Result Date: 05/25/2022 CLINICAL DATA:  Lower extremity swelling. EXAM: CHEST - 2 VIEW COMPARISON:  March 20, 2022. FINDINGS: Stable cardiomediastinal silhouette. Sternotomy wires are noted. Right lung is clear. Minimal left basilar subsegmental atelectasis and small pleural effusion is noted. Left-sided pacemaker is unchanged. Bony thorax is unremarkable. IMPRESSION: Minimal left basilar subsegmental atelectasis with small left pleural effusion. Electronically Signed   By: Lupita Raider M.D.   On: 05/25/2022 10:30     Discharge Instructions: Discharge Instructions     Diet - low sodium heart healthy   Complete by: As directed    Discharge instructions   Complete by: As directed    Mr. Kishbaugh,  You were recently admitted to [facility] for swelling in scrotum. You were given IV lasix. Please elevate scrotum over next few days to help decrease swelling. It is important that you follow-up with the kidney doctor and your primary care physician in 1-2 weeks.  Continue taking your home medications with the following changes:  Start taking 1. Spironolactone 25 mg daily  Stop taking 1. Midodrine 2. Valsartan You should seek further medical care if swelling worsens.  Sincerely, Rudene Christians, DO   Increase activity slowly   Complete by: As directed        Signed: Rudene Christians, DO 05/26/2022, 1:49 PM   Pager: [phone]

## 2022-05-26 NOTE — Progress Notes (Signed)
Edward [name] to be D/C'd  per MD order.  Discussed with the patient and all questions fully answered.  VSS, Skin clean, dry and intact without evidence of skin break down, no evidence of skin tears noted.  IV catheter discontinued intact. Site without signs and symptoms of complications. Dressing and pressure applied.  An After Visit Summary was printed and given to the patient. Patient received prescription.  D/c education completed with patient/family including follow up instructions, medication list, d/c activities limitations if indicated, with other d/c instructions as indicated by MD - patient able to verbalize understanding, all questions fully answered.   Patient instructed to return to ED, call 911, or call MD for any changes in condition.   Patient to be escorted via WC, and D/C home via private auto.

## 2022-05-26 NOTE — Procedures (Signed)
Patient presented to IR for evaluation for therapeutic paracentesis. Limited ultrasound examination of the abdomen revealed insufficient fluid to perform this procedure.   No procedure performed. Images saved in Epic. Dr. Archer Asa made aware.  Alwyn Ren, [place] [phone] 05/26/2022, 8:37 AM

## 2022-07-14 ENCOUNTER — Other Ambulatory Visit: Payer: Self-pay | Admitting: Internal Medicine

## 2022-08-09 ENCOUNTER — Other Ambulatory Visit: Payer: Self-pay | Admitting: Student

## 2022-08-18 ENCOUNTER — Other Ambulatory Visit: Payer: Self-pay

## 2022-08-18 ENCOUNTER — Observation Stay (HOSPITAL_COMMUNITY)
Admission: EM | Admit: 2022-08-18 | Discharge: 2022-08-19 | Disposition: A | Discharge status: Home / Self Care | Payer: No Typology Code available for payment source | Attending: Internal Medicine | Admitting: Internal Medicine

## 2022-08-18 ENCOUNTER — Emergency Department (HOSPITAL_COMMUNITY): Payer: No Typology Code available for payment source

## 2022-08-18 ENCOUNTER — Encounter (HOSPITAL_COMMUNITY): Payer: Self-pay | Admitting: Internal Medicine

## 2022-08-18 DIAGNOSIS — Z950 Presence of cardiac pacemaker: Secondary | ICD-10-CM | POA: Diagnosis not present

## 2022-08-18 DIAGNOSIS — Z8673 Personal history of transient ischemic attack (TIA), and cerebral infarction without residual deficits: Secondary | ICD-10-CM | POA: Diagnosis not present

## 2022-08-18 DIAGNOSIS — Z951 Presence of aortocoronary bypass graft: Secondary | ICD-10-CM | POA: Diagnosis not present

## 2022-08-18 DIAGNOSIS — I5032 Chronic diastolic (congestive) heart failure: Secondary | ICD-10-CM | POA: Diagnosis not present

## 2022-08-18 DIAGNOSIS — Z96652 Presence of left artificial knee joint: Secondary | ICD-10-CM | POA: Insufficient documentation

## 2022-08-18 DIAGNOSIS — J449 Chronic obstructive pulmonary disease, unspecified: Secondary | ICD-10-CM | POA: Diagnosis not present

## 2022-08-18 DIAGNOSIS — I130 Hypertensive heart and chronic kidney disease with heart failure and stage 1 through stage 4 chronic kidney disease, or unspecified chronic kidney disease: Secondary | ICD-10-CM | POA: Diagnosis not present

## 2022-08-18 DIAGNOSIS — I2510 Atherosclerotic heart disease of native coronary artery without angina pectoris: Secondary | ICD-10-CM | POA: Diagnosis not present

## 2022-08-18 DIAGNOSIS — Z794 Long term (current) use of insulin: Secondary | ICD-10-CM | POA: Diagnosis not present

## 2022-08-18 DIAGNOSIS — R4182 Altered mental status, unspecified: Secondary | ICD-10-CM | POA: Diagnosis present

## 2022-08-18 DIAGNOSIS — E039 Hypothyroidism, unspecified: Secondary | ICD-10-CM | POA: Diagnosis not present

## 2022-08-18 DIAGNOSIS — I480 Paroxysmal atrial fibrillation: Secondary | ICD-10-CM | POA: Diagnosis not present

## 2022-08-18 DIAGNOSIS — K7682 Hepatic encephalopathy: Principal | ICD-10-CM | POA: Diagnosis present

## 2022-08-18 DIAGNOSIS — Z7985 Long-term (current) use of injectable non-insulin antidiabetic drugs: Secondary | ICD-10-CM | POA: Insufficient documentation

## 2022-08-18 DIAGNOSIS — Z7901 Long term (current) use of anticoagulants: Secondary | ICD-10-CM | POA: Diagnosis not present

## 2022-08-18 DIAGNOSIS — N183 Chronic kidney disease, stage 3 unspecified: Secondary | ICD-10-CM | POA: Diagnosis not present

## 2022-08-18 DIAGNOSIS — Z1152 Encounter for screening for COVID-19: Secondary | ICD-10-CM | POA: Diagnosis not present

## 2022-08-18 DIAGNOSIS — E1122 Type 2 diabetes mellitus with diabetic chronic kidney disease: Secondary | ICD-10-CM | POA: Insufficient documentation

## 2022-08-18 DIAGNOSIS — Z79899 Other long term (current) drug therapy: Secondary | ICD-10-CM | POA: Insufficient documentation

## 2022-08-18 DIAGNOSIS — R7989 Other specified abnormal findings of blood chemistry: Secondary | ICD-10-CM

## 2022-08-18 DIAGNOSIS — Z87891 Personal history of nicotine dependence: Secondary | ICD-10-CM | POA: Insufficient documentation

## 2022-08-18 DIAGNOSIS — R195 Other fecal abnormalities: Secondary | ICD-10-CM

## 2022-08-18 DIAGNOSIS — D62 Acute posthemorrhagic anemia: Secondary | ICD-10-CM | POA: Insufficient documentation

## 2022-08-18 DIAGNOSIS — D649 Anemia, unspecified: Secondary | ICD-10-CM

## 2022-08-18 DIAGNOSIS — R5383 Other fatigue: Secondary | ICD-10-CM

## 2022-08-18 LAB — LIPASE, BLOOD: Lipase: 50 U/L (ref 11–51)

## 2022-08-18 LAB — CBC WITH DIFFERENTIAL/PLATELET
Abs Immature Granulocytes: 0.06 10*3/uL (ref 0.00–0.07)
Basophils Absolute: 0.1 10*3/uL (ref 0.0–0.1)
Basophils Relative: 1 %
Eosinophils Absolute: 0.4 10*3/uL (ref 0.0–0.5)
Eosinophils Relative: 5 %
HCT: 25.2 % — ABNORMAL LOW (ref 39.0–52.0)
Hemoglobin: 7.2 g/dL — ABNORMAL LOW (ref 13.0–17.0)
Immature Granulocytes: 1 %
Lymphocytes Relative: 7 %
Lymphs Abs: 0.7 10*3/uL (ref 0.7–4.0)
MCH: 24.7 pg — ABNORMAL LOW (ref 26.0–34.0)
MCHC: 28.6 g/dL — ABNORMAL LOW (ref 30.0–36.0)
MCV: 86.6 fL (ref 80.0–100.0)
Monocytes Absolute: 1.1 10*3/uL — ABNORMAL HIGH (ref 0.1–1.0)
Monocytes Relative: 12 %
Neutro Abs: 7.1 10*3/uL (ref 1.7–7.7)
Neutrophils Relative %: 74 %
Platelets: 163 10*3/uL (ref 150–400)
RBC: 2.91 MIL/uL — ABNORMAL LOW (ref 4.22–5.81)
RDW: 19.5 % — ABNORMAL HIGH (ref 11.5–15.5)
WBC: 9.4 10*3/uL (ref 4.0–10.5)
nRBC: 0 % (ref 0.0–0.2)

## 2022-08-18 LAB — I-STAT CG4 LACTIC ACID, ED: Lactic Acid, Venous: 0.9 mmol/L (ref 0.5–1.9)

## 2022-08-18 LAB — RESP PANEL BY RT-PCR (RSV, FLU A&B, COVID)  RVPGX2
Influenza A by PCR: NEGATIVE
Influenza B by PCR: NEGATIVE
Resp Syncytial Virus by PCR: NEGATIVE
SARS Coronavirus 2 by RT PCR: NEGATIVE

## 2022-08-18 LAB — COMPREHENSIVE METABOLIC PANEL
ALT: 13 U/L (ref 0–44)
AST: 20 U/L (ref 15–41)
Albumin: 3 g/dL — ABNORMAL LOW (ref 3.5–5.0)
Alkaline Phosphatase: 133 U/L — ABNORMAL HIGH (ref 38–126)
Anion gap: 10 (ref 5–15)
BUN: 61 mg/dL — ABNORMAL HIGH (ref 8–23)
CO2: 22 mmol/L (ref 22–32)
Calcium: 8.9 mg/dL (ref 8.9–10.3)
Chloride: 106 mmol/L (ref 98–111)
Creatinine, Ser: 3.01 mg/dL — ABNORMAL HIGH (ref 0.61–1.24)
GFR, Estimated: 20 mL/min — ABNORMAL LOW (ref >60)
Glucose, Bld: 145 mg/dL — ABNORMAL HIGH (ref 70–99)
Potassium: 4.7 mmol/L (ref 3.5–5.1)
Sodium: 138 mmol/L (ref 135–145)
Total Bilirubin: 1 mg/dL (ref 0.3–1.2)
Total Protein: 7.5 g/dL (ref 6.5–8.1)

## 2022-08-18 LAB — URINALYSIS, W/ REFLEX TO CULTURE (INFECTION SUSPECTED)
Bacteria, UA: NONE SEEN
Bilirubin Urine: NEGATIVE
Glucose, UA: NEGATIVE mg/dL
Hgb urine dipstick: NEGATIVE
Ketones, ur: NEGATIVE mg/dL
Leukocytes,Ua: NEGATIVE
Nitrite: NEGATIVE
Protein, ur: NEGATIVE mg/dL
Specific Gravity, Urine: 1.012 (ref 1.005–1.030)
pH: 8 (ref 5.0–8.0)

## 2022-08-18 LAB — AMMONIA: Ammonia: 70 umol/L — ABNORMAL HIGH (ref 9–35)

## 2022-08-18 LAB — HEMOGLOBIN A1C
Hgb A1c MFr Bld: 5 % (ref 4.8–5.6)
Mean Plasma Glucose: 96.8 mg/dL

## 2022-08-18 LAB — TSH: TSH: 4.66 u[IU]/mL — ABNORMAL HIGH (ref 0.350–4.500)

## 2022-08-18 LAB — PREPARE RBC (CROSSMATCH)

## 2022-08-18 LAB — GLUCOSE, CAPILLARY: Glucose-Capillary: 201 mg/dL — ABNORMAL HIGH (ref 70–99)

## 2022-08-18 LAB — POC OCCULT BLOOD, ED: Fecal Occult Bld: POSITIVE — AB

## 2022-08-18 LAB — BRAIN NATRIURETIC PEPTIDE: B Natriuretic Peptide: 187.6 pg/mL — ABNORMAL HIGH (ref 0.0–100.0)

## 2022-08-18 MED ORDER — GABAPENTIN 100 MG PO CAPS
200.0000 mg | ORAL_CAPSULE | Freq: Two times a day (BID) | ORAL | Status: DC
  Administered 2022-08-18 – 2022-08-19 (×2): 200 mg via ORAL
  Filled 2022-08-18 (×2): qty 2

## 2022-08-18 MED ORDER — SPIRONOLACTONE 25 MG PO TABS: 25.0000 mg | ORAL_TABLET | Freq: Every day | ORAL | Status: DC

## 2022-08-18 MED ORDER — FUROSEMIDE 40 MG PO TABS
20.0000 mg | ORAL_TABLET | Freq: Two times a day (BID) | ORAL | Status: DC
  Administered 2022-08-19: 20 mg via ORAL
  Filled 2022-08-18: qty 1

## 2022-08-18 MED ORDER — ALBUTEROL SULFATE (2.5 MG/3ML) 0.083% IN NEBU: 2.5000 mg | INHALATION_SOLUTION | RESPIRATORY_TRACT | Status: DC | PRN

## 2022-08-18 MED ORDER — ALLOPURINOL 300 MG PO TABS
300.0000 mg | ORAL_TABLET | Freq: Every day | ORAL | Status: DC
  Administered 2022-08-19: 300 mg via ORAL
  Filled 2022-08-18: qty 1

## 2022-08-18 MED ORDER — ISOSORBIDE MONONITRATE ER 30 MG PO TB24
30.0000 mg | ORAL_TABLET | Freq: Every day | ORAL | Status: DC
  Administered 2022-08-18 – 2022-08-19 (×2): 30 mg via ORAL
  Filled 2022-08-18 (×2): qty 1

## 2022-08-18 MED ORDER — LACTULOSE 10 GM/15ML PO SOLN
10.0000 g | Freq: Three times a day (TID) | ORAL | Status: DC
  Administered 2022-08-18 – 2022-08-19 (×2): 10 g via ORAL
  Filled 2022-08-18 (×2): qty 15

## 2022-08-18 MED ORDER — TRAZODONE HCL 50 MG PO TABS
25.0000 mg | ORAL_TABLET | Freq: Every evening | ORAL | Status: DC | PRN
  Administered 2022-08-18: 25 mg via ORAL
  Filled 2022-08-18: qty 1

## 2022-08-18 MED ORDER — SODIUM CHLORIDE 0.9% IV SOLUTION: Freq: Once | INTRAVENOUS | Status: DC

## 2022-08-18 MED ORDER — CARVEDILOL 6.25 MG PO TABS
6.2500 mg | ORAL_TABLET | Freq: Two times a day (BID) | ORAL | Status: DC
  Administered 2022-08-18 – 2022-08-19 (×2): 6.25 mg via ORAL
  Filled 2022-08-18 (×2): qty 1

## 2022-08-18 MED ORDER — ACETAMINOPHEN 325 MG PO TABS: 650.0000 mg | ORAL_TABLET | Freq: Four times a day (QID) | ORAL | Status: DC | PRN

## 2022-08-18 MED ORDER — SPIRONOLACTONE 25 MG PO TABS
25.0000 mg | ORAL_TABLET | Freq: Every day | ORAL | Status: DC
  Administered 2022-08-18 – 2022-08-19 (×2): 25 mg via ORAL
  Filled 2022-08-18 (×2): qty 1

## 2022-08-18 MED ORDER — LACTULOSE 10 GM/15ML PO SOLN
10.0000 g | Freq: Once | ORAL | Status: AC
  Administered 2022-08-18: 10 g via ORAL

## 2022-08-18 MED ORDER — INSULIN ASPART 100 UNIT/ML IJ SOLN
0.0000 [IU] | Freq: Three times a day (TID) | INTRAMUSCULAR | Status: DC
  Administered 2022-08-19: 5 [IU] via SUBCUTANEOUS
  Filled 2022-08-18: qty 0.15

## 2022-08-18 MED ORDER — LEVOTHYROXINE SODIUM 25 MCG PO TABS
25.0000 ug | ORAL_TABLET | Freq: Every day | ORAL | Status: DC
  Administered 2022-08-19: 25 ug via ORAL
  Filled 2022-08-18: qty 1

## 2022-08-18 MED ORDER — INSULIN ASPART 100 UNIT/ML IJ SOLN
0.0000 [IU] | Freq: Every day | INTRAMUSCULAR | Status: DC
  Administered 2022-08-18: 2 [IU] via SUBCUTANEOUS
  Filled 2022-08-18: qty 0.05

## 2022-08-18 MED ORDER — LORATADINE 10 MG PO TABS
10.0000 mg | ORAL_TABLET | Freq: Every day | ORAL | Status: DC
  Administered 2022-08-18 – 2022-08-19 (×2): 10 mg via ORAL
  Filled 2022-08-18 (×2): qty 1

## 2022-08-18 MED ORDER — PANTOPRAZOLE SODIUM 40 MG PO TBEC
40.0000 mg | DELAYED_RELEASE_TABLET | Freq: Every day | ORAL | Status: DC
  Administered 2022-08-18 – 2022-08-19 (×2): 40 mg via ORAL
  Filled 2022-08-18 (×2): qty 1

## 2022-08-18 MED ORDER — INSULIN GLARGINE-YFGN 100 UNIT/ML ~~LOC~~ SOLN
48.0000 [IU] | Freq: Every day | SUBCUTANEOUS | Status: DC
Start: 2022-08-19 — End: 2022-08-18

## 2022-08-18 MED ORDER — INSULIN GLARGINE-YFGN 100 UNIT/ML ~~LOC~~ SOLN
35.0000 [IU] | Freq: Every day | SUBCUTANEOUS | Status: DC
  Administered 2022-08-18: 35 [IU] via SUBCUTANEOUS
  Filled 2022-08-18 (×2): qty 0.35

## 2022-08-18 MED ORDER — ONDANSETRON HCL 4 MG PO TABS: 4.0000 mg | ORAL_TABLET | Freq: Four times a day (QID) | ORAL | Status: DC | PRN

## 2022-08-18 MED ORDER — ONDANSETRON HCL 4 MG/2ML IJ SOLN: 4.0000 mg | Freq: Four times a day (QID) | INTRAMUSCULAR | Status: DC | PRN

## 2022-08-18 MED ORDER — ACETAMINOPHEN 650 MG RE SUPP: 650.0000 mg | Freq: Four times a day (QID) | RECTAL | Status: DC | PRN

## 2022-08-18 NOTE — ED Provider Notes (Signed)
[name] Provider Note   CSN: [id number] Arrival date & time: 08/18/22  1326     History  Chief Complaint  Patient presents with   Altered Mental Status   Fatigue    [name] is a 82 y.o. male.  The history is provided by the patient, medical records and a relative. No language interpreter was used.  Altered Mental Status Presenting symptoms: behavior changes and confusion   Severity:  Moderate Most recent episode:  More than 2 days ago Episode history:  Continuous Duration:  3 days Timing:  Intermittent Progression:  Waxing and waning Chronicity:  New Associated symptoms: slurred speech (waxing and waning abnormal speech)   Associated symptoms: no abdominal pain, no agitation, no difficulty breathing, no fever, no headaches, no light-headedness, no nausea, no palpitations, no rash, no seizures and no vomiting        Home Medications Prior to Admission medications   Medication Sig Start Date End Date Taking? Authorizing Provider  acetaminophen (TYLENOL) 650 MG CR tablet Take 650 mg by mouth every 8 (eight) hours as needed for pain.    [provider]  albuterol (PROVENTIL HFA;VENTOLIN HFA) 108 (90 Base) MCG/ACT inhaler USE 2 PUFFS EVERY 4 HOURS AS NEEDED FOR WHEEZING Patient not taking: Reported on 03/20/2022 11/11/17   Nahser, [facility] Ping, MD  allopurinol (ZYLOPRIM) 100 MG tablet Take 50 mg by mouth daily.    [provider]  apixaban (ELIQUIS) 2.5 MG TABS tablet Take 2.5 mg by mouth 2 (two) times daily.    [provider]  ascorbic acid (VITAMIN C) 500 MG tablet Take 1,000 mg by mouth every Monday, Wednesday, and Friday.    [provider]  carvedilol (COREG) 6.25 MG tablet Take 1 tablet (6.25 mg total) by mouth 2 (two) times daily. 02/24/19   Nahser, [facility] Ping, MD  cetirizine (ZYRTEC) 10 MG tablet Take 10 mg by mouth daily.    [provider]  cholecalciferol (VITAMIN D3) 25 MCG  (1000 UNIT) tablet Take 2,000 Units by mouth daily.    [provider]  cyanocobalamin 1000 MCG tablet Take 1,000 mcg by mouth daily.    [provider]  ferrous sulfate 325 (65 FE) MG tablet Take 650 mg by mouth every Monday, Wednesday, and Friday.    [provider]  furosemide (LASIX) 40 MG tablet Take 1.5-2 tablets (60-80 mg total) by mouth See admin instructions. Take 2 tablets by mouth in the morning and 1.5 tablets in the evening 05/26/22   Masters, Edward Addison, DO  gabapentin (NEURONTIN) 100 MG capsule Take 200-300 mg by mouth See admin instructions. Take 2 capsules by mouth in the morning and 3 capsules in the evening    [provider]  insulin glargine (LANTUS) 100 UNIT/ML injection Inject 48 Units into the skin in the morning.    [provider]  isosorbide mononitrate (IMDUR) 30 MG 24 hr tablet Take 30 mg by mouth daily.    [provider]  levothyroxine (SYNTHROID) 25 MCG tablet Take 25 mcg by mouth daily before breakfast.    [provider]  Magnesium Oxide 420 MG TABS Take 2 tablets by mouth at bedtime.    [provider]  Multiple Vitamin (MULTIVITAMIN) capsule Take 1 capsule by mouth daily.    [provider]  pantoprazole (PROTONIX) 40 MG tablet Take 40 mg by mouth daily.    [provider]  polyvinyl alcohol (LIQUIFILM TEARS) 1.4 % ophthalmic  solution Place 1 drop into both eyes as needed for dry eyes. 03/25/22   Edward Simon, MD  Semaglutide, 1 MG/DOSE, 2 MG/1.5ML SOPN Inject 1 mg into the skin every Sunday.    [provider]  spironolactone (ALDACTONE) 25 MG tablet Take 1 tablet (25 mg total) by mouth daily. 05/26/22   Masters, Katie, DO  testosterone cypionate (DEPOTESTOSTERONE CYPIONATE) 200 MG/ML injection Inject 200 mg into the muscle every 14 (fourteen) days. Given every other Sunday    [provider]  Zinc 50 MG TABS Take 1 tablet (50 mg total) by mouth daily. 03/25/22    [facility] Simon, MD      Allergies    Lipitor [atorvastatin calcium]    Review of Systems   Review of Systems  Constitutional:  Positive for fatigue. Negative for chills, diaphoresis and fever.  HENT:  Negative for congestion.   Respiratory:  Negative for cough, chest tightness, shortness of breath and wheezing.   Cardiovascular:  Positive for leg swelling (improved per family report). Negative for chest pain and palpitations.  Gastrointestinal:  Negative for abdominal pain, constipation, diarrhea, nausea and vomiting.  Musculoskeletal:  Negative for back pain, myalgias, neck pain and neck stiffness.  Skin:  Negative for rash and wound.  Neurological:  Negative for seizures, light-headedness and headaches.  Psychiatric/Behavioral:  Positive for confusion. Negative for agitation.   All other systems reviewed and are negative.   Physical Exam Updated Vital Signs BP 135/73 Comment: Simultaneous filing. User may not have seen previous data.  Pulse 70 Comment: Simultaneous filing. User may not have seen previous data.  Temp 97.9 F (36.6 C) (Oral)   Resp 16   SpO2 100% Comment: Simultaneous filing. User may not have seen previous data. Physical Exam Vitals and nursing note reviewed.  Constitutional:      General: He is not in acute distress.    Appearance: He is well-developed. He is not ill-appearing, toxic-appearing or diaphoretic.  HENT:     Head: Normocephalic and atraumatic.     Nose: No congestion or rhinorrhea.     Mouth/Throat:     Mouth: Mucous membranes are moist.     Pharynx: No oropharyngeal exudate or posterior oropharyngeal erythema.  Eyes:     Extraocular Movements: Extraocular movements intact.     Conjunctiva/sclera: Conjunctivae normal.     Pupils: Pupils are equal, round, and reactive to light.  Cardiovascular:     Rate and Rhythm: Normal rate and regular rhythm.     Heart sounds: No murmur heard. Pulmonary:     Effort: Pulmonary effort is normal. No  respiratory distress.     Breath sounds: Normal breath sounds. No wheezing, rhonchi or rales.  Chest:     Chest wall: No tenderness.  Abdominal:     Palpations: Abdomen is soft.     Tenderness: There is no abdominal tenderness. There is no right CVA tenderness, left CVA tenderness, guarding or rebound.  Musculoskeletal:        General: No swelling or tenderness.     Cervical back: Neck supple.     Right lower leg: Edema present.     Left lower leg: Edema present.  Skin:    General: Skin is warm and dry.     Capillary Refill: Capillary refill takes less than 2 seconds.     Findings: No erythema or rash.  Neurological:     General: No focal deficit present.     Mental Status: He is alert.  Cranial Nerves: No cranial nerve deficit, dysarthria or facial asymmetry.     Sensory: No sensory deficit.     Motor: Tremor present. No weakness, abnormal muscle tone or seizure activity.     Coordination: Finger-Nose-Finger Test abnormal.     Comments: Slight tremor left arm finger-nose-finger testing.  Mild asterixis with both hands with stop motion.  Psychiatric:        Mood and Affect: Mood normal.     ED Results / Procedures / Treatments   Labs (all labs ordered are listed, but only abnormal results are displayed) Labs Reviewed  CBC WITH DIFFERENTIAL/PLATELET - Abnormal; Notable for the following components:      Result Value   RBC 2.91 (*)    Hemoglobin 7.2 (*)    HCT 25.2 (*)    MCH 24.7 (*)    MCHC 28.6 (*)    RDW 19.5 (*)    Monocytes Absolute 1.1 (*)    All other components within normal limits  COMPREHENSIVE METABOLIC PANEL - Abnormal; Notable for the following components:   Glucose, Bld 145 (*)    BUN 61 (*)    Creatinine, Ser 3.01 (*)    Albumin 3.0 (*)    Alkaline Phosphatase 133 (*)    GFR, Estimated 20 (*)    All other components within normal limits  AMMONIA - Abnormal; Notable for the following components:   Ammonia 70 (*)    All other components within  normal limits  BRAIN NATRIURETIC PEPTIDE - Abnormal; Notable for the following components:   B Natriuretic Peptide 187.6 (*)    All other components within normal limits  TSH - Abnormal; Notable for the following components:   TSH 4.660 (*)    All other components within normal limits  POC OCCULT BLOOD, ED - Abnormal; Notable for the following components:   Fecal Occult Bld POSITIVE (*)    All other components within normal limits  URINE CULTURE  RESP PANEL BY RT-PCR (RSV, FLU A&B, COVID)  RVPGX2  LIPASE, BLOOD  URINALYSIS, W/ REFLEX TO CULTURE (INFECTION SUSPECTED)  I-STAT CG4 LACTIC ACID, ED  I-STAT CG4 LACTIC ACID, ED  TYPE AND SCREEN  PREPARE RBC (CROSSMATCH)    EKG EKG Interpretation Date/Time:  Saturday August 18 2022 16:11:07 EDT Ventricular Rate:  69 PR Interval:    QRS Duration:  114 QT Interval:  424 QTC Calculation: 455 R Axis:   106  Text Interpretation: Junctional rhythm Borderline intraventricular conduction delay Borderline low voltage, extremity leads Repol abnrm suggests ischemia, anterolateral when compared to prior, similar appearance no STEMI Confirmed by Theda Belfast ([id number]) on 08/18/2022 4:46:57 PM  Radiology DG Chest 2 View  Result Date: 08/18/2022 CLINICAL DATA:  Altered mental status, evaluate for infection EXAM: CHEST - 2 VIEW COMPARISON:  05/25/2022 FINDINGS: Transverse diameter of heart is increased. Metallic sutures are seen in the sternum. There is metallic clamp in region of left atrial appendage. Pacemaker battery is seen in the left infraclavicular region with tips of leads in right atrium and right ventricle. There are no signs of alveolar pulmonary edema or focal pulmonary consolidation. There is no pleural effusion or pneumothorax. IMPRESSION: Cardiomegaly. There are no signs of pulmonary edema or focal pulmonary consolidation. Electronically Signed   By: Ernie Avena M.D.   On: 08/18/2022 15:12   CT HEAD WO CONTRAST ( )  Result Date:  08/18/2022 CLINICAL DATA:  Mental status changes. EXAM: CT HEAD WITHOUT CONTRAST TECHNIQUE: Contiguous axial images were obtained from the base of  the skull through the vertex without intravenous contrast. RADIATION DOSE REDUCTION: This exam was performed according to the departmental dose-optimization program which includes automated exposure control, adjustment of the mA and/or kV according to patient size and/or use of iterative reconstruction technique. COMPARISON:  09/30/2020 FINDINGS: Brain: No evidence for acute hemorrhage, hydrocephalus, or midline shift. No definite CT evidence for acute ischemia. 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 Stable 3.7 x 2.5 cm (3.9 x 2.6 cm reported previously) superior left convexity meningioma without appreciable adjacent edema. As before, lesion does generates some mass-effect on the adjacent cortex. Vascular: No hyperdense vessel or unexpected calcification. Skull: No evidence for fracture. No worrisome lytic or sclerotic lesion. Sinuses/Orbits: The visualized paranasal sinuses and mastoid air cells are clear. Visualized portions of the globes and intraorbital fat are unremarkable. Other: None IMPRESSION: 1. No acute intracranial abnormality. 2. 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 3. Old right parietal infarct. Electronically Signed   By: Kennith Center M.D.   On: 08/18/2022 14:58    Procedures Procedures    CRITICAL CARE Performed by: Canary Brim  Total critical care time: 35 minutes Critical care time was exclusive of separately billable procedures and treating other patients. Critical care was necessary to treat or prevent imminent or life-threatening deterioration. Critical care was time spent personally by me on the following activities: development of treatment plan with patient and/or surrogate as well as nursing, discussions with consultants, evaluation of patient's response to treatment, examination of patient, obtaining  history from patient or surrogate, ordering and performing treatments and interventions, ordering and review of laboratory studies, ordering and review of radiographic studies, pulse oximetry and re-evaluation of patient's condition.  Medications Ordered in ED Medications  0.9 %  sodium chloride infusion (Manually program via Guardrails IV Fluids) (has no administration in time range)    ED Course/ Medical Decision Making/ A&P                                 Medical Decision Making Amount and/or Complexity of Data Reviewed Labs: ordered. Radiology: ordered.  Risk Prescription drug management. Decision regarding hospitalization.    [name] is a 82 y.o. 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  According to wife, for the last 3 days the patient is had waxing waning altered mental status with confusion disorientation and speech changes.  She reports she was not eating and drinking well and has been acting different.  She reports her speech has been garbled and slurred at times but today is doing better.  He has had no recent falls or trauma.  He is denying any pain including no headache, neck pain, chest pain, or abdominal pain.  No back or flank pains.  Patient is denying any nausea or vomiting and denies any constipation, diarrhea, or urinary changes.  Denies any fevers, chills, congestion, or cough.  Denies any rashes.  They are concerned that he could have some lab abnormality, anemia, or some other problem.  On exam, lungs were clear.  Chest is nontender.   Abdomen was nontender.  Bowel sounds  were appreciated.  Patient was moving all extremities and has mild edema in legs they report is actually improved from prior.  I do not appreciate a murmur.  Patient has no carotid bruit on my exam initially.  Patient had intact sensation and strength in arms and legs.  He did have some mild asterixis with outstretched stopping motion of the hands and he had some difficulty with tremor on finger-nose-finger on the left side.  Pupils are symmetric and reactive with normal extraocular movements.  Patient otherwise has no complaints.  We will get workup to look for lab abnormality, occult infection, metabolic encephalopathy, will get a CT head for stroke.  She reports his pacemaker cannot get MRIs.  Will get workup for occult infection and will reassess.  Anticipate reassessment after workup to determine disposition.                   Workup continues to return.  CT head showed no acute abnormality and shows just the old changes.  Chest x-ray showed no pneumonia or new pulmonary edema.  Metabolic panel did show elevated creatinine but it is similar to prior.  What is concerning is the hemoglobin has dropped to 7.2 and ammonia is elevated to 70.  There is no prior ammonia Pearson but I suspect a degree of hepatic encephalopathy and symptomatic anemia.  Lactic acid nonelevated.  Lipase normal.  TSH mildly abnormal.    Due to the dark stools and blood thinner use and drop in hemoglobin, will get fecal occult test.  It is in process.  He will need admission for hepatic encephalopathy causing altered mental status and likely symptomatic anemia.  Will wait for results of fecal occult test and BNP however he will need admission.  Spoke to hospitalist team who agrees with admission.  Patient will get some lactulose as well.  Patient be admitted for further management.         Final Clinical Impression(s) / ED Diagnoses Final diagnoses:  Altered mental status,  unspecified altered mental status type  Fatigue, unspecified type  Symptomatic anemia  Hepatic encephalopathy (HCC)  Increased ammonia level  Fecal occult blood test positive     Clinical Impression: 1. Altered mental status, unspecified altered mental status type   2. Fatigue, unspecified type   3. Symptomatic anemia   4. Hepatic encephalopathy (HCC)   5. Increased ammonia level   6. Fecal occult blood test positive     Disposition: Admit  This note was prepared with assistance of Dragon voice recognition software. Occasional wrong-word or sound-a-like substitutions may have occurred due to the inherent limitations of voice recognition software.      , Canary Brim, MD 08/18/22 [phone]

## 2022-08-18 NOTE — ED Notes (Signed)
ED TO INPATIENT HANDOFF REPORT  Name/Age/Gender Edward [name] Civil 82 y.o. male  Code Status    Code Status Orders  (From admission, onward)           Start     Ordered   08/18/22 1714  Full code  Continuous       Question:  By:  Answer:  Consent: discussion documented in EHR   08/18/22 1714           Code Status History     Date Active Date Inactive Code Status Order ID Comments User Context   05/25/2022 1347 05/26/2022 1951 Full Code [id number]  Rudene Christians, DO ED   03/20/2022 1609 03/25/2022 2027 Full Code [id number]  Celine Mans, MD ED   05/06/2021 1544 05/09/2021 1732 Full Code [id number]  Jonah Blue, MD ED   03/16/2021 1738 03/19/2021 1847 Full Code [id number]  Merrilyn Puma, MD ED   11/05/2012 2022 11/06/2012 1346 Full Code [id number]  Duke Salvia, MD Inpatient   03/06/2012 1449 03/09/2012 1233 Full Code [id number]  [place]e [place], [place] Inpatient   06/18/2011 0954 06/22/2011 1449 Full Code [id number]  Benjamine Sprague, RN Inpatient   06/14/2011 1618 06/18/2011 0954 Full Code [id number]  Theron Arista, RN Inpatient       Home/SNF/Other Home  Chief Complaint Hepatic encephalopathy [facility]) [K76.82]  Level of Care/Admitting Diagnosis ED Disposition     ED Disposition  Admit   Condition  --   Comment  Hospital Area: [facility] [[id number]]  Level of Care: Med-Surg [16]  May admit patient to [facility] or [facility] if equivalent level of care is available:: Yes  Covid Evaluation: Asymptomatic - no recent exposure (last 10 days) testing not required  Diagnosis: Hepatic encephalopathy (HCC) [572.2.ICD-9-CM]  Admitting Physician: Maryln Gottron [[id number]]  Attending Physician: Kirby Crigler, MIR [email or web address] [[id number]]  Certification:: I certify this patient will need inpatient services for at least 2 midnights  Estimated Length of Stay: 2          Medical History Past Medical History:  Diagnosis Date   Anemia    ?   Carotid artery occlusion    Chronic  diastolic CHF (congestive heart failure) (HCC) 04/23/2012   CKD (chronic kidney disease), stage III (HCC)    Claudication (HCC)    Coronary artery disease    s/p CABG 2013   Diabetic neuropathy (HCC)    mostly feet/ legs   Diverticulosis    Dyslipidemia    Exertional shortness of breath    GERD (gastroesophageal reflux disease)    GI bleed    while on triple anticoag therapy   HTN (hypertension)    Pacemaker    PAF (paroxysmal atrial fibrillation) (HCC)    s/p MAZE at time of CABG   PVD (peripheral vascular disease) (HCC)    R CEA 2011, prior PTA, left fem-pop 2014   Stroke (HCC)    vs TIA   Tubular adenoma of colon    Type II diabetes mellitus (HCC)     Allergies Allergies  Allergen Reactions   Lipitor [Atorvastatin Calcium] Other (See Comments)    Weakness/ pain in legs    IV Location/Drains/Wounds Patient Lines/Drains/Airways Status     Active Line/Drains/Airways     Name Placement date Placement time Site Days   Peripheral IV 08/18/22 20 G Anterior;Distal;Left;Upper Arm 08/18/22  1335  Arm  less than 1   Wound / Incision (Open or Dehisced) 03/18/21 Skin tear Arm Posterior;Right;Upper 03/18/21  1940  Arm  518            Labs/Imaging Results for orders placed or performed during the hospital encounter of 08/18/22 (from the past 48 hour(s))  CBC with Differential     Status: Abnormal   Collection Time: 08/18/22  2:22 PM  Result Value Ref Range   WBC 9.4 4.0 - 10.5 K/uL   RBC 2.91 (L) 4.22 - 5.81 MIL/uL   Hemoglobin 7.2 (L) 13.0 - 17.0 g/dL   HCT 56.2 (L) 13.0 - 86.5 %   MCV 86.6 80.0 - 100.0 fL   MCH 24.7 (L) 26.0 - 34.0 pg   MCHC 28.6 (L) 30.0 - 36.0 g/dL   RDW 78.4 (H) 69.6 - 29.5 %   Platelets 163 150 - 400 K/uL   nRBC 0.0 0.0 - 0.2 %   Neutrophils Relative % 74 %   Neutro Abs 7.1 1.7 - 7.7 K/uL   Lymphocytes Relative 7 %   Lymphs Abs 0.7 0.7 - 4.0 K/uL   Monocytes Relative 12 %   Monocytes Absolute 1.1 (H) 0.1 - 1.0 K/uL   Eosinophils Relative  5 %   Eosinophils Absolute 0.4 0.0 - 0.5 K/uL   Basophils Relative 1 %   Basophils Absolute 0.1 0.0 - 0.1 K/uL   Immature Granulocytes 1 %   Abs Immature Granulocytes 0.06 0.00 - 0.07 K/uL    Comment: Performed at [facility], [address], [place], [place] [address]  Comprehensive metabolic panel     Status: Abnormal   Collection Time: 08/18/22  2:22 PM  Result Value Ref Range   Sodium 138 135 - 145 mmol/L   Potassium 4.7 3.5 - 5.1 mmol/L   Chloride 106 98 - 111 mmol/L   CO2 22 22 - 32 mmol/L   Glucose, Bld 145 (H) 70 - 99 mg/dL    Comment: Glucose reference range applies only to samples taken after fasting for at least 8 hours.   BUN 61 (H) 8 - 23 mg/dL   Creatinine, Ser 2.44 (H) 0.61 - 1.24 mg/dL   Calcium 8.9 8.9 - 01.0 mg/dL   Total Protein 7.5 6.5 - 8.1 g/dL   Albumin 3.0 (L) 3.5 - 5.0 g/dL   AST 20 15 - 41 U/L   ALT 13 0 - 44 U/L   Alkaline Phosphatase 133 (H) 38 - 126 U/L   Total Bilirubin 1.0 0.3 - 1.2 mg/dL   GFR, Estimated 20 (L) >60 mL/min    Comment: (NOTE) Calculated using the CKD-EPI Creatinine Equation (2021)    Anion gap 10 5 - 15    Comment: Performed at [facility], [address], [place], [place] [address]  Lipase, blood     Status: None   Collection Time: 08/18/22  2:22 PM  Result Value Ref Range   Lipase 50 11 - 51 U/L    Comment: Performed at [facility], [address], [place], [place] [address]  Ammonia     Status: Abnormal   Collection Time: 08/18/22  2:22 PM  Result Value Ref Range   Ammonia 70 (H) 9 - 35 umol/L    Comment: Performed at [facility], [address], [place], [place] [address]  Brain natriuretic peptide     Status: Abnormal   Collection Time: 08/18/22  2:22 PM  Result Value Ref Range   B Natriuretic Peptide 187.6 (H) 0.0 - 100.0 pg/mL    Comment: Performed at [facility]  [facility], [address], [place], [place] [address]  I-Stat CG4  Lactic Acid     Status: None   Collection Time: 08/18/22  2:28 PM  Result Value Ref Range   Lactic Acid, Venous 0.9 0.5 - 1.9 mmol/L  TSH     Status: Abnormal   Collection Time: 08/18/22  2:38 PM  Result Value Ref Range   TSH 4.660 (H) 0.350 - 4.500 uIU/mL    Comment: Performed by a 3rd Generation assay with a functional sensitivity of <=0.01 uIU/mL. Performed at [facility], [address], [place], [place] [address]   Urinalysis, w/ Reflex to Culture (Infection Suspected) -Urine, Clean Catch     Status: None   Collection Time: 08/18/22  3:59 PM  Result Value Ref Range   Specimen Source URINE, CLEAN CATCH    Color, Urine YELLOW YELLOW   APPearance CLEAR CLEAR   Specific Gravity, Urine 1.012 1.005 - 1.030   pH 8.0 5.0 - 8.0   Glucose, UA NEGATIVE NEGATIVE mg/dL   Hgb urine dipstick NEGATIVE NEGATIVE   Bilirubin Urine NEGATIVE NEGATIVE   Ketones, ur NEGATIVE NEGATIVE mg/dL   Protein, ur NEGATIVE NEGATIVE mg/dL   Nitrite NEGATIVE NEGATIVE   Leukocytes,Ua NEGATIVE NEGATIVE   RBC / HPF 11-20 0 - 5 RBC/hpf   WBC, UA 0-5 0 - 5 WBC/hpf    Comment:        Reflex urine culture not performed if WBC <=10, OR if Squamous epithelial cells >5. If Squamous epithelial cells >5 suggest recollection.    Bacteria, UA NONE SEEN NONE SEEN   Squamous Epithelial / HPF 0-5 0 - 5 /HPF    Comment: Performed at [facility], [address], [place], [place] [address]  Resp panel by RT-PCR (RSV, Flu A&B, Covid) Urine, Clean Catch     Status: None   Collection Time: 08/18/22  3:59 PM   Specimen: Urine, Clean Catch; Nasal Swab  Result Value Ref Range   SARS Coronavirus 2 by RT PCR NEGATIVE NEGATIVE    Comment: (NOTE) SARS-CoV-2 target nucleic acids are NOT DETECTED.  The SARS-CoV-2 RNA is generally detectable in upper respiratory specimens during the acute phase of infection. The lowest concentration of SARS-CoV-2 viral copies this assay can detect is 138  copies/mL. A negative result does not preclude SARS-Cov-2 infection and should not be used as the sole basis for treatment or other patient management decisions. A negative result may occur with  improper specimen collection/handling, submission of specimen other than nasopharyngeal swab, presence of viral mutation(s) within the areas targeted by this assay, and inadequate number of viral copies(<138 copies/mL). A negative result must be combined with clinical observations, patient history, and epidemiological information. The expected result is Negative.  Fact Sheet for Patients:  BloggerCourse.com  Fact Sheet for Healthcare Providers:  SeriousBroker.it  This test is no t yet approved or cleared by the [place] FDA and  has been authorized for detection and/or diagnosis of SARS-CoV-2 by FDA under an Emergency Use Authorization (EUA). This EUA will remain  in effect (meaning this test can be used) for the duration of the COVID-19 declaration under Section 564(b)(1) of the Act, 21 U.S.C.section 360bbb-3(b)(1), unless the authorization is terminated  or revoked sooner.       Influenza A by PCR NEGATIVE NEGATIVE   Influenza B by PCR NEGATIVE NEGATIVE    Comment: (NOTE) The Xpert Xpress SARS-CoV-2/FLU/RSV plus assay is intended as an aid in the diagnosis of  influenza from Nasopharyngeal swab specimens and should not be used as a sole basis for treatment. Nasal washings and aspirates are unacceptable for Xpert Xpress SARS-CoV-2/FLU/RSV testing.  Fact Sheet for Patients: BloggerCourse.com  Fact Sheet for Healthcare Providers: SeriousBroker.it  This test is not yet approved or cleared by the [place] FDA and has been authorized for detection and/or diagnosis of SARS-CoV-2 by FDA under an Emergency Use Authorization (EUA). This EUA will remain in effect (meaning this test  can be used) for the duration of the COVID-19 declaration under Section 564(b)(1) of the Act, 21 U.S.C. section 360bbb-3(b)(1), unless the authorization is terminated or revoked.     Resp Syncytial Virus by PCR NEGATIVE NEGATIVE    Comment: (NOTE) Fact Sheet for Patients: BloggerCourse.com  Fact Sheet for Healthcare Providers: SeriousBroker.it  This test is not yet approved or cleared by the [place] FDA and has been authorized for detection and/or diagnosis of SARS-CoV-2 by FDA under an Emergency Use Authorization (EUA). This EUA will remain in effect (meaning this test can be used) for the duration of the COVID-19 declaration under Section 564(b)(1) of the Act, 21 U.S.C. section 360bbb-3(b)(1), unless the authorization is terminated or revoked.  Performed at [facility], [address] [place], [place] [address]   POC occult blood, ED     Status: Abnormal   Collection Time: 08/18/22  4:12 PM  Result Value Ref Range   Fecal Occult Bld POSITIVE (A) NEGATIVE   DG Chest 2 View  Result Date: 08/18/2022 CLINICAL DATA:  Altered mental status, evaluate for infection EXAM: CHEST - 2 VIEW COMPARISON:  05/25/2022 FINDINGS: Transverse diameter of heart is increased. Metallic sutures are seen in the sternum. There is metallic clamp in region of left atrial appendage. Pacemaker battery is seen in the left infraclavicular region with tips of leads in right atrium and right ventricle. There are no signs of alveolar pulmonary edema or focal pulmonary consolidation. There is no pleural effusion or pneumothorax. IMPRESSION: Cardiomegaly. There are no signs of pulmonary edema or focal pulmonary consolidation. Electronically Signed   By: Ernie Avena M.D.   On: 08/18/2022 15:12   CT HEAD WO CONTRAST ( )  Result Date: 08/18/2022 CLINICAL DATA:  Mental status changes. EXAM: CT HEAD WITHOUT CONTRAST TECHNIQUE: Contiguous  axial images were obtained from the base of the skull through the vertex without intravenous contrast. RADIATION DOSE REDUCTION: This exam was performed according to the departmental dose-optimization program which includes automated exposure control, adjustment of the mA and/or kV according to patient size and/or use of iterative reconstruction technique. COMPARISON:  09/30/2020 FINDINGS: Brain: No evidence for acute hemorrhage, hydrocephalus, or midline shift. No definite CT evidence for acute ischemia. 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 Stable 3.7 x 2.5 cm (3.9 x 2.6 cm reported previously) superior left convexity meningioma without appreciable adjacent edema. As before, lesion does generates some mass-effect on the adjacent cortex. Vascular: No hyperdense vessel or unexpected calcification. Skull: No evidence for fracture. No worrisome lytic or sclerotic lesion. Sinuses/Orbits: The visualized paranasal sinuses and mastoid air cells are clear. Visualized portions of the globes and intraorbital fat are unremarkable. Other: None IMPRESSION: 1. No acute intracranial abnormality. 2. 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 3. Old right parietal infarct. Electronically Signed   By: Kennith Center M.D.   On: 08/18/2022 14:58    Pending Labs Unresulted Labs (From admission, onward)     Start     Ordered   08/19/22 0500  Comprehensive metabolic panel  Tomorrow morning,   R        08/18/22 1714   08/19/22 0500  CBC  Tomorrow morning,   R        08/18/22 1714   08/18/22 1714  Hemoglobin A1c  (Glycemic Control (SSI)  Q 4 Hours / Glycemic Control (SSI)  AC +/- HS)  Once,   R       Comments: To assess prior glycemic control    08/18/22 1714   08/18/22 1603  Prepare RBC (crossmatch)  (Blood Administration Adult)  Once,   R       Comments: History of cardiac disease, altered mental status in the setting of dropping hemoglobin of 7.2 on blood thinners.   Question Answer Comment  # of Units 1  unit   Transfusion Indications Hemoglobin 8 gm/dL or less and orthopedic or cardiac surgery or pre-existing cardiac condition   Number of Units to Keep Ahead NO units ahead   If emergent release call blood bank Not emergent release      08/18/22 1604   08/18/22 1551  Type and screen [facility]  Once,   STAT       Comments: [facility]    08/18/22 1550   08/18/22 1412  Urine Culture  Once,   URGENT       Question:  Indication  Answer:  Altered mental status (if no other cause identified)   08/18/22 1413            Vitals/Pain Today's Vitals   08/18/22 1430 08/18/22 1545 08/18/22 1630 08/18/22 1715  BP: 135/67 137/73 (!) 147/76 (!) 146/76  Pulse: 77 69 69 69  Resp: 16 16 15 15   Temp:   97.9 F (36.6 C)   TempSrc:   Oral   SpO2: 100% 100% 100% 100%  PainSc:        Isolation Precautions No active isolations  Medications Medications  0.9 %  sodium chloride infusion (Manually program via Guardrails IV Fluids) (has no administration in time range)  lactulose (CHRONULAC) 10 GM/15ML solution 10 g (has no administration in time range)  allopurinol (ZYLOPRIM) tablet 300 mg (has no administration in time range)  carvedilol (COREG) tablet 6.25 mg (has no administration in time range)  furosemide (LASIX) tablet 20 mg (has no administration in time range)  isosorbide mononitrate (IMDUR) 24 hr tablet 30 mg (has no administration in time range)  spironolactone (ALDACTONE) tablet 25 mg (has no administration in time range)  insulin glargine-yfgn (SEMGLEE) injection 48 Units (has no administration in time range)  levothyroxine (SYNTHROID) tablet 25 mcg (has no administration in time range)  pantoprazole (PROTONIX) EC tablet 40 mg (has no administration in time range)  gabapentin (NEURONTIN) capsule 200 mg (has no administration in time range)  loratadine (CLARITIN) tablet 10 mg (has no administration in time range)  insulin aspart (novoLOG)  injection 0-15 Units (has no administration in time range)  insulin aspart (novoLOG) injection 0-5 Units (has no administration in time range)  acetaminophen (TYLENOL) tablet 650 mg (has no administration in time range)    Or  acetaminophen (TYLENOL) suppository 650 mg (has no administration in time range)  traZODone (DESYREL) tablet 25 mg (has no administration in time range)  ondansetron (ZOFRAN) tablet 4 mg (has no administration in time range)    Or  ondansetron (ZOFRAN) injection 4 mg (has no administration in time range)  albuterol (PROVENTIL) (2.5 MG/3ML) 0.083% nebulizer solution 2.5 mg (has  no administration in time range)  lactulose (CHRONULAC) 10 GM/15ML solution 10 g (has no administration in time range)    Mobility walks with device

## 2022-08-18 NOTE — ED Triage Notes (Signed)
EMS reports from home. Wife called for past 3 days increasing lethargy and AMS. Hx of Diabetes. Implanted pacemaker.  BP 136/65 HR 71 RR 16 Sp02 99 RA  20ga LAC

## 2022-08-18 NOTE — ED Notes (Signed)
Several unsuccessful attempts made to interrogate [employer] pacemaker. Equipment would not function properly. Also attempted to interrogate with rep on phone in real time unsuccessful. [employer] local rep will respond as soon as possible.

## 2022-08-18 NOTE — H&P (Signed)
History and Physical  Edward [name] ZOX:[id number] DOB: 1940/12/24 DOA: 08/18/2022  PCP: Concha Pyo, MD   Chief Complaint: confusion   HPI: Edward [name] is a 82 y.o. male with medical history significant for CKD stage III, CAD status post CABG, paroxysmal atrial fibrillation with pacemaker on Eliquis, chronic diastolic congestive heart failure, insulin-dependent diabetes and Edward [name] cirrhosis being admitted to the hospital with mild hepatic encephalopathy and acute on chronic anemia.  History is provided mainly by the patient's wife, who states that she brought him to the hospital because the last 3 days he has been very lethargic, and confused.  He has not been eating very much, spent most of the day yesterday sleeping almost around-the-clock.  He has not had any worsening lower extremity edema, no worsening abdominal ascites.  No fevers, chills, nausea, vomiting or any other signs of acute illness.  He has never been confused and lethargic like this before.  For the first time yesterday, she woke him up and made him eat 3 meals, and this morning he was more awake and alert than he has been in the last several days.  He has been anemic for quite some time, he primarily seen at the [place] and apparently they have been worried about his anemia and suspected GI bleed.  Patient and wife have not noticed any dark stools, or frank bleeding.  They state that he had upper and lower endoscopy last year and is planning for outpatient capsule endoscopy.  ED Course: On evaluation in the emergency department, he has been slightly hypertensive but otherwise vital signs have been normal.  Lab work reveals WBC 9.4, hemoglobin 7.2 down from baseline of about 9.  Hemoccult positive.  BNP 187, CMP shows creatinine 3.10 slightly up from baseline, normal lipase, normal LFTs, ammonia 70.  Review of Systems: Please see HPI for pertinent positives and negatives. A complete 10 system review of systems are otherwise  negative.  Past Medical History:  Diagnosis Date   Anemia    ?   Carotid artery occlusion    Chronic diastolic CHF (congestive heart failure) (HCC) 04/23/2012   CKD (chronic kidney disease), stage III (HCC)    Claudication (HCC)    Coronary artery disease    s/p CABG 2013   Diabetic neuropathy (HCC)    mostly feet/ legs   Diverticulosis    Dyslipidemia    Exertional shortness of breath    GERD (gastroesophageal reflux disease)    GI bleed    while on triple anticoag therapy   HTN (hypertension)    Pacemaker    PAF (paroxysmal atrial fibrillation) (HCC)    s/p MAZE at time of CABG   PVD (peripheral vascular disease) (HCC)    R CEA 2011, prior PTA, left fem-pop 2014   Stroke (HCC)    vs TIA   Tubular adenoma of colon    Type II diabetes mellitus (HCC)    Past Surgical History:  Procedure Laterality Date   ABDOMINAL AORTAGRAM N/A 01/07/2012   Procedure: ABDOMINAL Ronny Flurry;  Surgeon: Chuck Hint, MD;  Location: [facility] CATH LAB;  Service: Cardiovascular;  Laterality: N/A;   BALLOON DILATION N/A 02/17/2013   Procedure: BALLOON DILATION;  Surgeon: Beverley Fiedler, MD;  Location: [facility] ENDOSCOPY;  Service: Gastroenterology;  Laterality: N/A;   BIOPSY  03/17/2021   Procedure: BIOPSY;  Surgeon: Iva Boop, MD;  Location: [facility] ENDOSCOPY;  Service: Gastroenterology;;   CAROTID ENDARTERECTOMY Right 03/2009   CATARACT EXTRACTION Bilateral  CHOLECYSTECTOMY     CIRCUMCISION     COLONOSCOPY WITH PROPOFOL N/A 02/17/2013   Procedure: COLONOSCOPY WITH PROPOFOL;  Surgeon: Beverley Fiedler, MD;  Location: [facility] ENDOSCOPY;  Service: Gastroenterology;  Laterality: N/A;   COLONOSCOPY WITH PROPOFOL N/A 03/18/2021   Procedure: COLONOSCOPY WITH PROPOFOL;  Surgeon: Jeani Hawking, MD;  Location: [facility] ENDOSCOPY;  Service: Gastroenterology;  Laterality: N/A;   CORONARY ARTERY BYPASS GRAFT  06/14/2011   Procedure: CORONARY ARTERY BYPASS GRAFTING (CABG);  Surgeon: Alleen Borne, MD;  Location: [facility] OR;  Service:  Open Heart Surgery;  Laterality: N/A;  Coronary Artery Bypass Graft on pump times six;  utilizing internal mammary artery and right greater saphenous vein harvested endoscopically.x 5 vessels   ELECTROPHYSIOLOGY STUDY N/A 11/05/2012   Procedure: ELECTROPHYSIOLOGY STUDY;  Surgeon: Duke Salvia, MD;  Location: [facility] CATH LAB;  Service: Cardiovascular;  Laterality: N/A;   ESOPHAGOGASTRODUODENOSCOPY (EGD) WITH PROPOFOL N/A 02/17/2013   Procedure: ESOPHAGOGASTRODUODENOSCOPY (EGD) WITH PROPOFOL;  Surgeon: Beverley Fiedler, MD;  Location: [facility] ENDOSCOPY;  Service: Gastroenterology;  Laterality: N/A;   ESOPHAGOGASTRODUODENOSCOPY (EGD) WITH PROPOFOL N/A 03/17/2021   Procedure: ESOPHAGOGASTRODUODENOSCOPY (EGD) WITH PROPOFOL;  Surgeon: Iva Boop, MD;  Location: [facility] ENDOSCOPY;  Service: Gastroenterology;  Laterality: N/A;   FEMORAL-POPLITEAL BYPASS GRAFT Left 03/06/2012   Procedure: BYPASS GRAFT FEMORAL-POPLITEAL ARTERY;  Surgeon: Chuck Hint, MD;  Location: [facility] OR;  Service: Vascular;  Laterality: Left;  Left Femoral - Below Knee Popliteal Bypass Graft with Vein and Intraoperative Arteriogram.   IR PARACENTESIS  05/23/2022   JOINT REPLACEMENT Left    left TKA   KNEE ARTHROSCOPY Left    "I had 3" (11/05/2012)   LEAD REVISION  11/05/2012   pacemaker/notes 11/05/2012   LEAD REVISION N/A 11/05/2012   Procedure: LEAD REVISION;  Surgeon: Duke Salvia, MD;  Location[facility] CATH LAB;  Service: Cardiovascular;  Laterality: N/A;   MAZE  06/14/2011   Procedure: MAZE;  Surgeon: Alleen Borne, MD;  Location: [facility] OR;  Service: Open Heart Surgery;  Laterality: N/A;  Ligate left atrial appendage   PERMANENT PACEMAKER INSERTION N/A 06/20/2011   Procedure: PERMANENT PACEMAKER INSERTION;  Surgeon: Duke Salvia, MD;  Location: [place] CATH LAB;  Service: Cardiovascular;  Laterality: N/A;   POLYPECTOMY  03/18/2021   Procedure: POLYPECTOMY;  Surgeon: Jeani Hawking, MD;  Location: [facility] ENDOSCOPY;  Service: Gastroenterology;;    TONSILLECTOMY AND ADENOIDECTOMY     TOTAL KNEE ARTHROPLASTY Left     Social History:  reports that he quit smoking about 43 years ago. His smoking use included cigarettes. He started smoking about 75 years ago. He has a 96 pack-year smoking history. He has never used smokeless tobacco. He reports current alcohol use of about 2.0 standard drinks of alcohol per week. He reports that he does not use drugs.   Allergies  Allergen Reactions   Lipitor [Atorvastatin Calcium] Other (See Comments)    Weakness/ pain in legs    Family History  Problem Relation Age of Onset   Coronary artery disease Father    Hypertension Father    Heart disease Father        Heart Disease before age 63   Heart attack Mother    Coronary artery disease Mother    Deep vein thrombosis Mother    Heart disease Mother        Heart Disease before age 72   Hyperlipidemia Mother    Hypertension Mother    Hypertension Sister    Heart disease  Sister        Heart Disease before age 23   Hyperlipidemia Sister    Heart disease Brother        Heart Disease before age 12   Colon cancer Brother 9   Anesthesia problems Neg Hx    Diabetes Neg Hx      Prior to Admission medications   Medication Sig Start Date End Date Taking? Authorizing Provider  acetaminophen (TYLENOL) 650 MG CR tablet Take 100 mg by mouth daily.   Yes [provider]  allopurinol (ZYLOPRIM) 100 MG tablet Take 300 mg by mouth daily.   Yes [provider]  apixaban (ELIQUIS) 2.5 MG TABS tablet Take 2.5 mg by mouth 2 (two) times daily.   Yes [provider]  ascorbic acid (VITAMIN C) 500 MG tablet Take 1,000 mg by mouth every Monday, Wednesday, and Friday.   Yes [provider]  carvedilol (COREG) 6.25 MG tablet Take 1 tablet (6.25 mg total) by mouth 2 (two) times daily. 02/24/19  Yes Nahser, Deloris Ping, MD  cetirizine (ZYRTEC) 10 MG tablet Take 10 mg by mouth daily.   Yes [provider]  cholecalciferol  (VITAMIN D3) 25 MCG (1000 UNIT) tablet Take 2,000 Units by mouth daily.   Yes [provider]  cyanocobalamin 1000 MCG tablet Take 1,000 mcg by mouth daily.   Yes [provider]  ferrous sulfate 325 (65 FE) MG tablet Take 650 mg by mouth every Monday, Wednesday, and Friday.   Yes [provider]  furosemide (LASIX) 40 MG tablet Take 1.5-2 tablets (60-80 mg total) by mouth See admin instructions. Take 2 tablets by mouth in the morning and 1.5 tablets in the evening Patient taking differently: Take 20 mg by mouth 2 (two) times daily. 05/26/22  Yes Masters, Katie, DO  gabapentin (NEURONTIN) 100 MG capsule Take 200-300 mg by mouth 2 (two) times daily. Take 2 capsules by mouth in the morning and 3 capsules in the evening   Yes [provider]  insulin glargine (LANTUS) 100 UNIT/ML injection Inject 48 Units into the skin in the morning.   Yes [provider]  isosorbide mononitrate (IMDUR) 30 MG 24 hr tablet Take 30 mg by mouth daily.   Yes [provider]  levothyroxine (SYNTHROID) 25 MCG tablet Take 25 mcg by mouth daily before breakfast.   Yes [provider]  Multiple Vitamin (MULTIVITAMIN) capsule Take 1 capsule by mouth daily.   Yes [provider]  pantoprazole (PROTONIX) 40 MG tablet Take 10 mg by mouth daily.   Yes [provider]  Semaglutide, 1 MG/DOSE, 2 MG/1.5ML SOPN Inject 1 mg into the skin every Sunday.   Yes [provider]  spironolactone (ALDACTONE) 25 MG tablet Take 1 tablet (25 mg total) by mouth daily. 05/26/22  Yes Masters, Katie, DO  testosterone cypionate (DEPOTESTOSTERONE CYPIONATE) 200 MG/ML injection Inject 200 mg into the muscle every 14 (fourteen) days. Given every other Sunday   Yes [provider]  Vitamin C 250 MG TABS Take 500 mg by mouth 3 (three) times a week.   Yes [provider]  Zinc 50 MG TABS Take 1 tablet (50 mg total) by mouth daily. 03/25/22  Yes [name], MD  albuterol (PROVENTIL HFA;VENTOLIN HFA) 108 (90 Base) MCG/ACT inhaler USE 2 PUFFS EVERY 4 HOURS AS NEEDED FOR WHEEZING Patient not taking: Reported on 03/20/2022 11/11/17   Nahser, Deloris Ping, MD  polyvinyl alcohol (LIQUIFILM TEARS) 1.4 % ophthalmic solution Place  1 drop into both eyes as needed for dry eyes. Patient not taking: Reported on 08/18/2022 03/25/22   [name], MD    Physical Exam: BP (!) 146/76   Pulse 69   Temp 97.9 F (36.6 C) (Oral)   Resp 15   SpO2 100%   General:  Alert, oriented, calm, in no acute distress, wife at the bedside Eyes: EOMI, clear conjuctivae, white sclerea Neck: supple, no masses, trachea mildline  Cardiovascular: RRR, no murmurs or rubs, no peripheral edema  Respiratory: clear to auscultation bilaterally, no wheezes, no crackles  Abdomen: soft, nontender, distended with slight fluid wave, normal bowel tones heard  Skin: dry, no rashes  Musculoskeletal: no joint effusions, normal range of motion  Psychiatric: appropriate affect, normal speech  Neurologic: extraocular muscles intact, clear speech, moving all extremities with intact sensorium          Labs on Admission:  Basic Metabolic Panel: Recent Labs  Lab 08/18/22 1422  NA 138  K 4.7  CL 106  CO2 22  GLUCOSE 145*  BUN 61*  CREATININE 3.01*  CALCIUM 8.9   Liver Function Tests: Recent Labs  Lab 08/18/22 1422  AST 20  ALT 13  ALKPHOS 133*  BILITOT 1.0  PROT 7.5  ALBUMIN 3.0*   Recent Labs  Lab 08/18/22 1422  LIPASE 50   Recent Labs  Lab 08/18/22 1422  AMMONIA 70*   CBC: Recent Labs  Lab 08/18/22 1422  WBC 9.4  NEUTROABS 7.1  HGB 7.2*  HCT 25.2*  MCV 86.6  PLT 163   Cardiac Enzymes: No results for input(s): "CKTOTAL", "CKMB", "CKMBINDEX", "TROPONINI" in the last 168 hours.  BNP (last 3 results) Recent Labs    03/20/22 1137 05/25/22 0945 08/18/22 1422  BNP 131.2* 182.2* 187.6*    ProBNP (last 3 results) No results for input(s): "PROBNP" in the  last 8760 hours.  CBG: No results for input(s): "GLUCAP" in the last 168 hours.  Radiological Exams on Admission: DG Chest 2 View  Result Date: 08/18/2022 CLINICAL DATA:  Altered mental status, evaluate for infection EXAM: CHEST - 2 VIEW COMPARISON:  05/25/2022 FINDINGS: Transverse diameter of heart is increased. Metallic sutures are seen in the sternum. There is metallic clamp in region of left atrial appendage. Pacemaker battery is seen in the left infraclavicular region with tips of leads in right atrium and right ventricle. There are no signs of alveolar pulmonary edema or focal pulmonary consolidation. There is no pleural effusion or pneumothorax. IMPRESSION: Cardiomegaly. There are no signs of pulmonary edema or focal pulmonary consolidation. Electronically Signed   By: Ernie Avena M.D.   On: 08/18/2022 15:12   CT HEAD WO CONTRAST ( )  Result Date: 08/18/2022 CLINICAL DATA:  Mental status changes. EXAM: CT HEAD WITHOUT CONTRAST TECHNIQUE: Contiguous axial images were obtained from the base of the skull through the vertex without intravenous contrast. RADIATION DOSE REDUCTION: This exam was performed according to the departmental dose-optimization program which includes automated exposure control, adjustment of the mA and/or kV according to patient size and/or use of iterative reconstruction technique. COMPARISON:  09/30/2020 FINDINGS: Brain: No evidence for acute hemorrhage, hydrocephalus, or midline shift. No definite CT evidence for acute ischemia. 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 Stable 3.7 x 2.5 cm (3.9 x 2.6 cm reported previously) superior left convexity meningioma without appreciable adjacent edema. As before, lesion does generates some mass-effect on the adjacent cortex. Vascular: No hyperdense vessel or unexpected calcification. Skull: No evidence for fracture. No worrisome lytic  or sclerotic lesion. Sinuses/Orbits: The visualized paranasal sinuses and mastoid air cells are  clear. Visualized portions of the globes and intraorbital fat are unremarkable. Other: None IMPRESSION: 1. No acute intracranial abnormality. 2. 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 3. Old right parietal infarct. Electronically Signed   By: Kennith Center M.D.   On: 08/18/2022 14:58    Assessment/Plan Edward [name] is a 82 y.o. male with medical history significant for CKD stage III, CAD status post CABG, paroxysmal atrial fibrillation with pacemaker on Eliquis, chronic diastolic congestive heart failure, insulin-dependent diabetes and Edward [name] cirrhosis being admitted to the hospital with mild hepatic encephalopathy and acute on chronic anemia.  Hepatic encephalopathy in the setting of Edward [name] cirrhosis-with elevated ammonia, no he has a history of abdominal ascites status post multiple paracenteses, last during his admission to [facility] earlier this year.  Does not appear to have a prior history of encephalopathy. -Inpatient admission -Start lactulose 10 g p.o. 3 times daily -Trend ammonia -Given lack of fevers, abdominal pain, or leukocytosis, SBP is quite unlikely -Continue Lasix and Aldactone, plan to hold if renal function worsens  Type 2 diabetes-insulin-dependent, very well-controlled -Update hemoglobin A1c -Carb controlled diet -Continue home Lantus (at reduced dose), and sliding scale  Acute on chronic anemia-with concern for possible GI bleeding, FOBT positive but note he is on iron supplementation -Hold anticoagulation -Transfuse 1 unit of blood  Paroxysmal atrial fibrillation-currently in sinus rhythm, holding Eliquis for now given concern for bleeding -Continue Coreg  GERD-Protonix  Hypertension-continue Coreg, Imdur  Hypothyroidism-Synthroid 25 mcg  Peripheral neuropathy-continue Neurontin 200 mg p.o. twice daily  DVT prophylaxis: Hold anticoagulation due to concern for GI bleed    Code Status: Full Code  Consults called:  GI  Admission  status: The appropriate patient status for this patient is INPATIENT. Inpatient status is judged to be reasonable and necessary in order to provide the required intensity of service to ensure the patient's safety. The patient's presenting symptoms, physical exam findings, and initial radiographic and laboratory data in the context of their chronic comorbidities is felt to place them at high risk for further clinical deterioration. Furthermore, it is not anticipated that the patient will be medically stable for discharge from the hospital within 2 midnights of admission.    I certify that at the point of admission it is my clinical judgment that the patient will require inpatient hospital care spanning beyond 2 midnights from the point of admission due to high intensity of service, high risk for further deterioration and high frequency of surveillance required  Time spent: 48 minutes   Sharlette Dense MD [facility] Pager [phone]  If 7PM-7AM, please contact night-coverage [email or web address] Password [email or web address]  08/18/2022, 5:30 PM

## 2022-08-19 DIAGNOSIS — K7460 Unspecified cirrhosis of liver: Secondary | ICD-10-CM | POA: Diagnosis not present

## 2022-08-19 DIAGNOSIS — D649 Anemia, unspecified: Secondary | ICD-10-CM | POA: Diagnosis not present

## 2022-08-19 DIAGNOSIS — K7682 Hepatic encephalopathy: Secondary | ICD-10-CM | POA: Diagnosis not present

## 2022-08-19 DIAGNOSIS — R195 Other fecal abnormalities: Secondary | ICD-10-CM | POA: Diagnosis not present

## 2022-08-19 DIAGNOSIS — I480 Paroxysmal atrial fibrillation: Secondary | ICD-10-CM

## 2022-08-19 DIAGNOSIS — K7581 Nonalcoholic steatohepatitis (NASH): Secondary | ICD-10-CM

## 2022-08-19 DIAGNOSIS — I4891 Unspecified atrial fibrillation: Secondary | ICD-10-CM

## 2022-08-19 DIAGNOSIS — N1832 Chronic kidney disease, stage 3b: Secondary | ICD-10-CM

## 2022-08-19 DIAGNOSIS — N189 Chronic kidney disease, unspecified: Secondary | ICD-10-CM

## 2022-08-19 DIAGNOSIS — Z7901 Long term (current) use of anticoagulants: Secondary | ICD-10-CM

## 2022-08-19 LAB — COMPREHENSIVE METABOLIC PANEL WITH GFR
ALT: 15 U/L (ref 0–44)
AST: 21 U/L (ref 15–41)
Albumin: 3.1 g/dL — ABNORMAL LOW (ref 3.5–5.0)
Alkaline Phosphatase: 126 U/L (ref 38–126)
Anion gap: 10 (ref 5–15)
BUN: 59 mg/dL — ABNORMAL HIGH (ref 8–23)
CO2: 21 mmol/L — ABNORMAL LOW (ref 22–32)
Calcium: 9 mg/dL (ref 8.9–10.3)
Chloride: 105 mmol/L (ref 98–111)
Creatinine, Ser: 2.67 mg/dL — ABNORMAL HIGH (ref 0.61–1.24)
GFR, Estimated: 23 mL/min — ABNORMAL LOW (ref >60)
Glucose, Bld: 125 mg/dL — ABNORMAL HIGH (ref 70–99)
Potassium: 4.2 mmol/L (ref 3.5–5.1)
Sodium: 136 mmol/L (ref 135–145)
Total Bilirubin: 1.3 mg/dL — ABNORMAL HIGH (ref 0.3–1.2)
Total Protein: 7.7 g/dL (ref 6.5–8.1)

## 2022-08-19 LAB — CBC
HCT: 27.5 % — ABNORMAL LOW (ref 39.0–52.0)
Hemoglobin: 7.8 g/dL — ABNORMAL LOW (ref 13.0–17.0)
MCH: 24.6 pg — ABNORMAL LOW (ref 26.0–34.0)
MCHC: 28.4 g/dL — ABNORMAL LOW (ref 30.0–36.0)
MCV: 86.8 fL (ref 80.0–100.0)
Platelets: 195 10*3/uL (ref 150–400)
RBC: 3.17 MIL/uL — ABNORMAL LOW (ref 4.22–5.81)
RDW: 19.6 % — ABNORMAL HIGH (ref 11.5–15.5)
WBC: 11.4 10*3/uL — ABNORMAL HIGH (ref 4.0–10.5)
nRBC: 0 % (ref 0.0–0.2)

## 2022-08-19 LAB — BPAM RBC
Blood Product Expiration Date: 202408312359
ISSUE DATE / TIME: 202408032111
Unit Type and Rh: 9500

## 2022-08-19 LAB — TYPE AND SCREEN
ABO/RH(D): O NEG
Antibody Screen: NEGATIVE
Unit division: 0

## 2022-08-19 LAB — GLUCOSE, CAPILLARY
Glucose-Capillary: 110 mg/dL — ABNORMAL HIGH (ref 70–99)
Glucose-Capillary: 205 mg/dL — ABNORMAL HIGH (ref 70–99)

## 2022-08-19 LAB — URINE CULTURE: Culture: NO GROWTH

## 2022-08-19 LAB — AMMONIA: Ammonia: 36 umol/L — ABNORMAL HIGH (ref 9–35)

## 2022-08-19 MED ORDER — LACTULOSE 10 GM/15ML PO SOLN: 10.0000 g | Freq: Three times a day (TID) | ORAL | 0 refills | Status: DC

## 2022-08-19 NOTE — Progress Notes (Signed)
Transition of Care [facility]) - Inpatient Brief Assessment   Patient Details  Name: Edward [name] MRN: [id number] Date of Birth: 25-Feb-1940  Transition of Care [facility]) CM/SW Contact:    Adrian Prows, RN Phone Number: 08/19/2022, 12:52 PM   Clinical Narrative: Pt identified POC wife Myril Kirst [phone]); Brief assessment completed.   Transition of Care Asessment: Insurance and Status: Insurance coverage has been reviewed Patient has primary care physician: Yes Home environment has been reviewed: yes Prior level of function:: independent Prior/Current Home Services: No current home services Social Determinants of Health Reivew: SDOH reviewed no interventions necessary Readmission risk has been reviewed: Yes Transition of care needs: no transition of care needs at this time

## 2022-08-19 NOTE — Plan of Care (Signed)

## 2022-08-19 NOTE — Consult Note (Addendum)
Referring Provider: [facility] Primary Care Physician:  Concha Pyo, MD Primary Gastroenterologist:  Dr. Rhea Belton  Reason for Consultation:  Anemia and heme positive stool  HPI: Edward [name] is a 82 y.o. 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  He presented to the ER on this occasion with complaints of confusion and lethargy.  Ammonia level found to be elevated at 70.  He was started on lactulose 10 g 3 times daily.  Ammonia level down to 36 this morning.  He is much improved and mentating normally.  GI was consulted for anemia and heme positive stools.  Hemoglobin 7.8 g this morning compared to between 9 and 10 g range of looks like baseline over the past several months.  He did receive 1 unit of packed red blood cells yesterday for hemoglobin of 7.2 g.  He is heme positive here.  He denies any blood in his stool at home, but says his stools are dark as he takes iron with vitamin C a few days per week.  EGD March 2023 showed gastritis with biopsies showing slight chronic inflammation and reactive changes, no H. pylori.  Colonoscopy March 2023 showed 2 polyps and diverticulosis.  These were tubular adenomas on pathology.  He was supposed to have a video capsule endoscopy performed.  Patient goes to the [place] and although he has been seen in the office on a few occasions in the past and several times as an inpatient for some reason he has been referred to both [place] [facility] and [facility] to have the capsule endoscopy study performed.  Unfortunately they require him to retrieve the capsule and he has adamantly refused to do that.  He has not proceeded with that study for that reason.  Today he is dressed and sitting up in the chair.  Tells me  that he has been told that he is going to be discharged after lunch and is adamant that he is going home today.   Past Medical History:  Diagnosis Date   Anemia    ?   Carotid artery occlusion    Chronic diastolic CHF (congestive heart failure) (HCC) 04/23/2012   CKD (chronic kidney disease), stage III (HCC)    Claudication (HCC)    Coronary artery disease    s/p CABG 2013   Diabetic neuropathy (HCC)    mostly feet/ legs   Diverticulosis    Dyslipidemia    Exertional shortness of breath    GERD (gastroesophageal reflux disease)    GI bleed    while on triple anticoag therapy   HTN (hypertension)    Pacemaker    PAF (paroxysmal atrial fibrillation) (HCC)    s/p MAZE at time of CABG   PVD (peripheral vascular disease) (HCC)    R CEA 2011, prior PTA, left fem-pop 2014   Stroke (HCC)    vs TIA   Tubular adenoma of colon    Type II diabetes mellitus (HCC)     Past Surgical History:  Procedure Laterality Date   ABDOMINAL AORTAGRAM N/A 01/07/2012   Procedure: ABDOMINAL Ronny Flurry;  Surgeon: Chuck Hint, MD;  Location: [facility] CATH LAB;  Service: Cardiovascular;  Laterality: N/A;   BALLOON DILATION N/A 02/17/2013   Procedure: BALLOON DILATION;  Surgeon: Beverley Fiedler, MD;  Location: [facility] ENDOSCOPY;  Service: Gastroenterology;  Laterality: N/A;   BIOPSY  03/17/2021   Procedure: BIOPSY;  Surgeon: Iva Boop, MD;  Location: [facility] ENDOSCOPY;  Service: Gastroenterology;;   CAROTID ENDARTERECTOMY Right 03/2009   CATARACT EXTRACTION Bilateral    CHOLECYSTECTOMY     CIRCUMCISION     COLONOSCOPY WITH PROPOFOL N/A 02/17/2013   Procedure: COLONOSCOPY WITH PROPOFOL;  Surgeon: Beverley Fiedler, MD;  Location: [facility] ENDOSCOPY;  Service: Gastroenterology;  Laterality: N/A;   COLONOSCOPY WITH PROPOFOL N/A 03/18/2021   Procedure: COLONOSCOPY WITH PROPOFOL;  Surgeon: Jeani Hawking, MD;  Location: [facility] ENDOSCOPY;  Service: Gastroenterology;  Laterality: N/A;   CORONARY ARTERY BYPASS GRAFT  06/14/2011    Procedure: CORONARY ARTERY BYPASS GRAFTING (CABG);  Surgeon: Alleen Borne, MD;  Location: [facility] OR;  Service: Open Heart Surgery;  Laterality: N/A;  Coronary Artery Bypass Graft on pump times six;  utilizing internal mammary artery and right greater saphenous vein harvested endoscopically.x 5 vessels   ELECTROPHYSIOLOGY STUDY N/A 11/05/2012   Procedure: ELECTROPHYSIOLOGY STUDY;  Surgeon: Duke Salvia, MD;  Location: [facility] CATH LAB;  Service: Cardiovascular;  Laterality: N/A;   ESOPHAGOGASTRODUODENOSCOPY (EGD) WITH PROPOFOL N/A 02/17/2013   Procedure: ESOPHAGOGASTRODUODENOSCOPY (EGD) WITH PROPOFOL;  Surgeon: Beverley Fiedler, MD;  Location: [facility] ENDOSCOPY;  Service: Gastroenterology;  Laterality: N/A;   ESOPHAGOGASTRODUODENOSCOPY (EGD) WITH PROPOFOL N/A 03/17/2021   Procedure: ESOPHAGOGASTRODUODENOSCOPY (EGD) WITH PROPOFOL;  Surgeon: Iva Boop, MD;  Location: [facility] ENDOSCOPY;  Service: Gastroenterology;  Laterality: N/A;   FEMORAL-POPLITEAL BYPASS GRAFT Left 03/06/2012   Procedure: BYPASS GRAFT FEMORAL-POPLITEAL ARTERY;  Surgeon: Chuck Hint, MD;  Location: [facility] OR;  Service: Vascular;  Laterality: Left;  Left Femoral - Below Knee Popliteal Bypass Graft with Vein and Intraoperative Arteriogram.   IR PARACENTESIS  05/23/2022   JOINT REPLACEMENT Left    left TKA   KNEE ARTHROSCOPY Left    "I had 3" (11/05/2012)   LEAD REVISION  11/05/2012   pacemaker/notes 11/05/2012   LEAD REVISION N/A 11/05/2012   Procedure: LEAD REVISION;  Surgeon: Duke Salvia, MD;  Location: [facility] CATH LAB;  Service: Cardiovascular;  Laterality: N/A;   MAZE  06/14/2011   Procedure: MAZE;  Surgeon: Alleen Borne, MD;  Location: [facility] OR;  Service: Open Heart Surgery;  Laterality: N/A;  Ligate left atrial appendage   PERMANENT PACEMAKER INSERTION N/A 06/20/2011   Procedure: PERMANENT PACEMAKER INSERTION;  Surgeon: Duke Salvia, MD;  Location: [facility] CATH LAB;  Service: Cardiovascular;  Laterality: N/A;   POLYPECTOMY  03/18/2021   Procedure:  POLYPECTOMY;  Surgeon: Jeani Hawking, MD;  Location: [facility] ENDOSCOPY;  Service: Gastroenterology;;   TONSILLECTOMY AND ADENOIDECTOMY     TOTAL KNEE ARTHROPLASTY Left     Prior to Admission medications   Medication Sig Start Date End Date Taking? Authorizing Provider  acetaminophen (TYLENOL) 650 MG CR tablet Take 100 mg by mouth daily.   Yes [provider]  allopurinol (ZYLOPRIM) 100 MG tablet Take 300 mg by mouth daily.   Yes [provider]  apixaban (ELIQUIS) 2.5 MG TABS tablet Take 2.5 mg by mouth 2 (two) times daily.   Yes [provider]  ascorbic acid (VITAMIN C) 500 MG tablet Take 1,000 mg by mouth every Monday, Wednesday, and Friday.   Yes [provider]  carvedilol (COREG) 6.25 MG tablet Take 1 tablet (6.25 mg total) by mouth 2 (two) times daily. 02/24/19  Yes Nahser, Deloris Ping, MD  cetirizine (ZYRTEC) 10 MG tablet Take 10 mg by mouth daily.  Yes [provider]  cholecalciferol (VITAMIN D3) 25 MCG (1000 UNIT) tablet Take 2,000 Units by mouth daily.   Yes [provider]  cyanocobalamin 1000 MCG tablet Take 1,000 mcg by mouth daily.   Yes [provider]  ferrous sulfate 325 (65 FE) MG tablet Take 650 mg by mouth every Monday, Wednesday, and Friday.   Yes [provider]  furosemide (LASIX) 40 MG tablet Take 1.5-2 tablets (60-80 mg total) by mouth See admin instructions. Take 2 tablets by mouth in the morning and 1.5 tablets in the evening Patient taking differently: Take 20 mg by mouth 2 (two) times daily. 05/26/22  Yes Masters, Katie, DO  gabapentin (NEURONTIN) 100 MG capsule Take 200-300 mg by mouth 2 (two) times daily. Take 2 capsules by mouth in the morning and 3 capsules in the evening   Yes [provider]  insulin glargine (LANTUS) 100 UNIT/ML injection Inject 48 Units into the skin in the morning.   Yes [provider]  isosorbide mononitrate (IMDUR) 30 MG 24 hr tablet Take 30 mg by mouth  daily.   Yes [provider]  levothyroxine (SYNTHROID) 25 MCG tablet Take 25 mcg by mouth daily before breakfast.   Yes [provider]  Multiple Vitamin (MULTIVITAMIN) capsule Take 1 capsule by mouth daily.   Yes [provider]  pantoprazole (PROTONIX) 40 MG tablet Take 10 mg by mouth daily.   Yes [provider]  Semaglutide, 1 MG/DOSE, 2 MG/1.5ML SOPN Inject 1 mg into the skin every Sunday.   Yes [provider]  spironolactone (ALDACTONE) 25 MG tablet Take 1 tablet (25 mg total) by mouth daily. 05/26/22  Yes Masters, Katie, DO  testosterone cypionate (DEPOTESTOSTERONE CYPIONATE) 200 MG/ML injection Inject 200 mg into the muscle every 14 (fourteen) days. Given every other Sunday   Yes [provider]  Vitamin C 250 MG TABS Take 500 mg by mouth 3 (three) times a week.   Yes [provider]  Zinc 50 MG TABS Take 1 tablet (50 mg total) by mouth daily. 03/25/22  Yes [name], MD  albuterol (PROVENTIL HFA;VENTOLIN HFA) 108 (90 Base) MCG/ACT inhaler USE 2 PUFFS EVERY 4 HOURS AS NEEDED FOR WHEEZING Patient not taking: Reported on 03/20/2022 11/11/17   Nahser, Deloris Ping, MD  polyvinyl alcohol (LIQUIFILM TEARS) 1.4 % ophthalmic solution Place 1 drop into both eyes as needed for dry eyes. Patient not taking: Reported on 08/18/2022 03/25/22   [name], MD    Current Facility-Administered Medications  Medication Dose Route Frequency Provider Last Rate Last Admin   0.9 %  sodium chloride infusion (Manually program via Guardrails IV Fluids)   Intravenous Once Tegeler, Canary Brim, MD       acetaminophen (TYLENOL) tablet 650 mg  650 mg Oral Q6H PRN Kirby Crigler, Mir M, MD       Or   acetaminophen (TYLENOL) suppository 650 mg  650 mg Rectal Q6H PRN Kirby Crigler, Mir M, MD       albuterol (PROVENTIL) (2.5 MG/3ML) 0.083% nebulizer solution 2.5 mg  2.5 mg Nebulization Q2H PRN Kirby Crigler, Mir M, MD       allopurinol (ZYLOPRIM) tablet 300 mg  300  mg Oral Daily Kirby Crigler, Mir M, MD       carvedilol (COREG) tablet 6.25 mg  6.25 mg Oral BID Kirby Crigler, Mir M, MD   6.25 mg at 08/18/22 2237   furosemide (LASIX) tablet 20 mg  20 mg Oral BID Maryln Gottron, MD  20 mg at 08/19/22 0813   gabapentin (NEURONTIN) capsule 200 mg  200 mg Oral BID Kirby Crigler, Mir M, MD   200 mg at 08/18/22 2234   insulin aspart (novoLOG) injection 0-15 Units  0-15 Units Subcutaneous TID WC Kirby Crigler, Mir M, MD       insulin aspart (novoLOG) injection 0-5 Units  0-5 Units Subcutaneous QHS Kirby Crigler, Mir M, MD   2 Units at 08/18/22 2231   insulin glargine-yfgn (SEMGLEE) injection 35 Units  35 Units Subcutaneous Q2200 Maryln Gottron, MD   35 Units at 08/18/22 2230   isosorbide mononitrate (IMDUR) 24 hr tablet 30 mg  30 mg Oral Daily Kirby Crigler, Mir M, MD   30 mg at 08/18/22 2233   lactulose (CHRONULAC) 10 GM/15ML solution 10 g  10 g Oral TID Maryln Gottron, MD   10 g at 08/18/22 2229   levothyroxine (SYNTHROID) tablet 25 mcg  25 mcg Oral QAC breakfast Kirby Crigler, Mir M, MD   25 mcg at 08/19/22 7829   loratadine (CLARITIN) tablet 10 mg  10 mg Oral Daily Kirby Crigler, Mir M, MD   10 mg at 08/18/22 2233   ondansetron (ZOFRAN) tablet 4 mg  4 mg Oral Q6H PRN Kirby Crigler, Mir M, MD       Or   ondansetron [facility]) injection 4 mg  4 mg Intravenous Q6H PRN Kirby Crigler, Mir M, MD       pantoprazole (PROTONIX) EC tablet 40 mg  40 mg Oral Daily Kirby Crigler, Mir M, MD   40 mg at 08/18/22 2237   spironolactone (ALDACTONE) tablet 25 mg  25 mg Oral Daily Kirby Crigler, Mir M, MD   25 mg at 08/18/22 2234   traZODone (DESYREL) tablet 25 mg  25 mg Oral QHS PRN Maryln Gottron, MD   25 mg at 08/18/22 2237    Allergies as of 08/18/2022 - Review Complete 08/18/2022  Allergen Reaction Noted   Lipitor [atorvastatin calcium] Other (See Comments) 05/30/2010    Family History  Problem Relation Age of Onset   Coronary artery disease Father    Hypertension Father    Heart disease Father         Heart Disease before age 44   Heart attack Mother    Coronary artery disease Mother    Deep vein thrombosis Mother    Heart disease Mother        Heart Disease before age 42   Hyperlipidemia Mother    Hypertension Mother    Hypertension Sister    Heart disease Sister        Heart Disease before age 26   Hyperlipidemia Sister    Heart disease Brother        Heart Disease before age 78   Colon cancer Brother 75   Anesthesia problems Neg Hx    Diabetes Neg Hx     Social History   Socioeconomic History   Marital status: Married    Spouse name: Not on file   Number of children: Not on file   Years of education: Not on file   Highest education level: Not on file  Occupational History   Occupation: retired  Tobacco Use   Smoking status: Former    Current packs/day: 0.00    Average packs/day: 3.0 packs/day for 32.0 years (96.0 ttl pk-yrs)    Types: Cigarettes    Start date: 01/16/1947    Quit date: 01/16/1979    Years since quitting: 43.6   Smokeless tobacco: Never  Vaping Use  Vaping status: Never Used  Substance and Sexual Activity   Alcohol use: Yes    Alcohol/week: 2.0 standard drinks of alcohol    Types: 2 Glasses of wine per week    Comment: 1 glass of champagne with dinner, occasional beer   Drug use: No   Sexual activity: Not Currently  Other Topics Concern   Not on file  Social History Narrative   Not on file   Social Determinants of Health   Financial Resource Strain: Not on file  Food Insecurity: No Food Insecurity (05/25/2022)   Hunger Vital Sign    Worried About Running Out of Food in the Last Year: Never true    Ran Out of Food in the Last Year: Never true  Transportation Needs: Not on file  Physical Activity: Not on file  Stress: Not on file  Social Connections: Unknown (05/30/2021)   [facility] from [facility]   Social Network    Social Network: Not on file  Intimate Partner Violence: Not At Risk (05/25/2022)   Humiliation,  Afraid, Rape, and Kick questionnaire    Fear of Current or Ex-Partner: No    Emotionally Abused: No    Physically Abused: No    Sexually Abused: No    Review of Systems: ROS is O/W negative except as mentioned in HPI.  Physical Exam: Vital signs in last 24 hours: Temp:  [97 F (36.1 C)-98.3 F (36.8 C)] 97.3 F (36.3 C) (08/04 0446) Pulse Rate:  [68-77] 71 (08/04 0446) Resp:  [10-20] 18 (08/04 0446) BP: (119-160)/(50-78) 131/65 (08/04 0446) SpO2:  [99 %-100 %] 100 % (08/04 0446) Weight:  [91.8 kg] 91.8 kg (08/03 2000) Last BM Date : 08/18/22 General:  Alert, Well-developed, well-nourished, pleasant and cooperative in NAD Head:  Normocephalic and atraumatic. Eyes:  Sclera clear, no icterus.  Conjunctiva pink. Ears:  Normal auditory acuity. Mouth:  No deformity or lesions.   Lungs:  Clear throughout to auscultation.  No wheezes, crackles, or rhonchi. Heart:  Regular rate and rhythm; no murmurs, clicks, rubs, or gallops. Abdomen:  Soft, non-distended.  BS present.  Non-tender.   Msk:  Symmetrical without gross deformities. Pulses:  Normal pulses noted. Extremities:  Chronic skin color changes noted in B/L LEs. Neurologic:  Alert and oriented x 4;  grossly normal neurologically. Skin:  Intact without significant lesions or rashes. Psych:  Alert and cooperative. Normal mood and affect.  Intake/Output from previous day: 08/03 0701 - 08/04 0700 In: 300 [Blood:300] Out: -  Intake/Output this shift: Total I/O In: 240 [P.O.:240] Out: -   Lab Results: Recent Labs    08/18/22 1422 08/19/22 0746  WBC 9.4 11.4*  HGB 7.2* 7.8*  HCT 25.2* 27.5*  PLT 163 195   BMET Recent Labs    08/18/22 1422 08/19/22 0746  NA 138 136  K 4.7 4.2  CL 106 105  CO2 22 21*  GLUCOSE 145* 125*  BUN 61* 59*  CREATININE 3.01* 2.67*  CALCIUM 8.9 9.0   LFT Recent Labs    08/19/22 0746  PROT 7.7  ALBUMIN 3.1*  AST 21  ALT 15  ALKPHOS 126  BILITOT 1.3*   Studies/Results: DG Chest  2 View  Result Date: 08/18/2022 CLINICAL DATA:  Altered mental status, evaluate for infection EXAM: CHEST - 2 VIEW COMPARISON:  05/25/2022 FINDINGS: Transverse diameter of heart is increased. Metallic sutures are seen in the sternum. There is metallic clamp in region of left atrial appendage. Pacemaker battery is seen in the  left infraclavicular region with tips of leads in right atrium and right ventricle. There are no signs of alveolar pulmonary edema or focal pulmonary consolidation. There is no pleural effusion or pneumothorax. IMPRESSION: Cardiomegaly. There are no signs of pulmonary edema or focal pulmonary consolidation. Electronically Signed   By: Ernie Avena M.D.   On: 08/18/2022 15:12   CT HEAD WO CONTRAST ( )  Result Date: 08/18/2022 CLINICAL DATA:  Mental status changes. EXAM: CT HEAD WITHOUT CONTRAST TECHNIQUE: Contiguous axial images were obtained from the base of the skull through the vertex without intravenous contrast. RADIATION DOSE REDUCTION: This exam was performed according to the departmental dose-optimization program which includes automated exposure control, adjustment of the mA and/or kV according to patient size and/or use of iterative reconstruction technique. COMPARISON:  09/30/2020 FINDINGS: Brain: No evidence for acute hemorrhage, hydrocephalus, or midline shift. No definite CT evidence for acute ischemia. 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 Stable 3.7 x 2.5 cm (3.9 x 2.6 cm reported previously) superior left convexity meningioma without appreciable adjacent edema. As before, lesion does generates some mass-effect on the adjacent cortex. Vascular: No hyperdense vessel or unexpected calcification. Skull: No evidence for fracture. No worrisome lytic or sclerotic lesion. Sinuses/Orbits: The visualized paranasal sinuses and mastoid air cells are clear. Visualized portions of the globes and intraorbital fat are unremarkable. Other: None IMPRESSION: 1. No acute intracranial  abnormality. 2. 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 3. Old right parietal infarct. Electronically Signed   By: Kennith Center M.D.   On: 08/18/2022 14:58    IMPRESSION:  *Acute on chronic iron deficiency anemia: Is heme positive here.  Hemoglobin 7.8 g this morning compared to between 9 and 10 g range of looks like baseline over the past several months.  He did receive 1 unit of packed red blood cells yesterday for hemoglobin of 7.2 g. 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  No esophageal varices on EGD March 2023.  Had some lethargy which is what brought him to the hospital and ammonia level 70.  He was started on lactulose 10 g 3 times daily.  Ammonia level down to 36 today. *Chronic kidney disease: Numbers actually improved recently. *Atrial fibrillation on Eliquis, which is currently on hold.  PLAN: -Patient adamant on discharge this afternoon.  He will likely need video capsule endoscopy, may be repeat EGD I guess this will be done as an outpatient.  He will need approval from the [place] first.  Sounds like he has been bounced around to several GI offices over the past year even though we have seen him in the past.  Not sure what the plan will be for his Eliquis for now.  Princella Pellegrini. Zehr  08/19/2022, 9:18 AM  GI ATTENDING  History, laboratories, x-rays, prior endoscopy reports reviewed.  Agree with comprehensive consultation note as outlined above.  Complex history as outlined above.  Has been evaluated previously for anemia and Hemoccult positive stool.  Patient's care has been compromised by quite suboptimal compliance.  Now wanting to leave the hospital prior to being seen by this GI attending.  He has been seen by multiple GI offices.  He was encouraged to reconnect with a GI practice of his preference.  Wilhemina Bonito. Eda Keys., M.D. [facility] Division of Gastroenterology

## 2022-08-19 NOTE — Discharge Summary (Signed)
Physician Discharge Summary   Patient: Edward [name] MRN: [id number] DOB: 29-Dec-1940  Admit date:     08/18/2022  Discharge date: 08/19/22  Discharge Physician: Marcelino Duster   PCP: Edward Pyo, MD   Recommendations at discharge:    PCP follow up in 1 week GI follow up for video capsule endoscopy to evaluate for bleeding. Advised to hold Eliquis for any dark/ bloody bowel movements.  Discharge Diagnoses: Principal Problem:   Hepatic encephalopathy (HCC)  Resolved Problems:   * No resolved hospital problems. *  Hospital Course:  Edward [name] is a 82 y.o. male with medical history significant for CKD stage III, CAD status post CABG, paroxysmal atrial fibrillation with pacemaker on Eliquis, chronic diastolic congestive heart failure, insulin-dependent diabetes and Edward [name] cirrhosis being admitted to the hospital with mild hepatic encephalopathy and acute on chronic anemia.  History is provided mainly by the patient's wife, who states that she brought him to the hospital because the last 3 days he has been very lethargic, and confused.  He has not been eating very much, spent most of the day yesterday sleeping almost around-the-clock.  He has not had any worsening lower extremity edema, no worsening abdominal ascites.  No fevers, chills, nausea, vomiting or any other signs of acute illness.  He has never been confused and lethargic like this before.  For the first time yesterday, she woke him up and made him eat 3 meals, and this morning he was more awake and alert than he has been in the last several days.  He has been anemic for quite some time, he primarily seen at the [place] and apparently they have been worried about his anemia and suspected GI bleed.  Patient and wife have not noticed any dark stools, or frank bleeding.  They state that he had upper and lower endoscopy last year and is planning for outpatient capsule endoscopy.   On evaluation in the emergency department, he has been  slightly hypertensive but otherwise vital signs have been normal. Lab work reveals WBC 9.4, hemoglobin 7.2 down from baseline of about 9. Hemoccult positive. BNP 187, CMP shows creatinine 3.10 slightly up from baseline, normal lipase, normal LFTs, ammonia 70.   Patient is admitted to hospitalist service for further evaluation and management of hepatic encephalopathy, possible GI bleed. He did get a unit of blood transfusion. His mental status improved. Patient is more alert and awake and does not wish to stay. GI evaluated him advised outpatient video capsule end[facility]. He is adamant to go home and states that he will follow his PCP, GI, nephrology upon discharge as instructed. Advised to hold Eliquis for dark/ bloody stool. I did discuss with patient and his wife regarding the above discharge plan. They understand and agree. He is at high risk for readmission.   Assessment and Plan: Hepatic encephalopathy in the setting of Edward [name] cirrhosis-with elevated ammonia, no he has a history of abdominal ascites status post multiple paracenteses, last during his admission to [facility] earlier this year.  No prior history of encephalopathy. Continue lactulose 10 g p.o. 3 times daily Ammonia trended down to 36. Continue Lasix and Aldactone. Remains high risk for readmission.   Type 2 diabetes-insulin-dependent, very well-controlled Continue Carb controlled diet. He is eating well. Continue home Lantus, and sliding scale   Acute on chronic anemia-with concern for possible GI bleeding, FOBT positive but note he is on iron supplementation. S/p 1 unit PRBC transfusion. Advised  to hold Eliquis for bleeding.  Paroxysmal atrial fibrillation- Continue Coreg, Eliquis.   GERD-Protonix   Hypertension-continue Coreg, Imdur   Hypothyroidism-Synthroid 25 mcg   Peripheral neuropathy-continue Neurontin 200 mg p.o. twice daily        Consultants: GI Procedures performed: none   Disposition: Home Diet recommendation:  Discharge Diet Orders (From admission, onward)     Start     Ordered   08/19/22 0000  Diet - low sodium heart healthy        08/19/22 1220           Cardiac and Carb modified diet DISCHARGE MEDICATION: Allergies as of 08/19/2022       Reactions   Lipitor [atorvastatin Calcium] Other (See Comments)   Weakness/ pain in legs        Medication List     TAKE these medications    acetaminophen 650 MG CR tablet Commonly known as: TYLENOL Take 100 mg by mouth daily.   albuterol 108 (90 Base) MCG/ACT inhaler Commonly known as: VENTOLIN HFA USE 2 PUFFS EVERY 4 HOURS AS NEEDED FOR WHEEZING   allopurinol 100 MG tablet Commonly known as: ZYLOPRIM Take 300 mg by mouth daily.   apixaban 2.5 MG Tabs tablet Commonly known as: ELIQUIS Take 2.5 mg by mouth 2 (two) times daily.   ascorbic acid 500 MG tablet Commonly known as: VITAMIN C Take 1,000 mg by mouth every Monday, Wednesday, and Friday.   carvedilol 6.25 MG tablet Commonly known as: COREG Take 1 tablet (6.25 mg total) by mouth 2 (two) times daily.   cetirizine 10 MG tablet Commonly known as: ZYRTEC Take 10 mg by mouth daily.   cholecalciferol 25 MCG (1000 UNIT) tablet Commonly known as: VITAMIN D3 Take 2,000 Units by mouth daily.   cyanocobalamin 1000 MCG tablet Take 1,000 mcg by mouth daily.   ferrous sulfate 325 (65 FE) MG tablet Take 650 mg by mouth every Monday, Wednesday, and Friday.   furosemide 40 MG tablet Commonly known as: LASIX Take 1.5-2 tablets (60-80 mg total) by mouth See admin instructions. Take 2 tablets by mouth in the morning and 1.5 tablets in the evening What changed:  how much to take when to take this additional instructions   gabapentin 100 MG capsule Commonly known as: NEURONTIN Take 200-300 mg by mouth 2 (two) times daily. Take 2 capsules by mouth in the morning and 3 capsules in the evening   insulin glargine 100 UNIT/ML  injection Commonly known as: LANTUS Inject 48 Units into the skin in the morning.   isosorbide mononitrate 30 MG 24 hr tablet Commonly known as: IMDUR Take 30 mg by mouth daily.   lactulose 10 GM/15ML solution Commonly known as: CHRONULAC Take 15 mLs (10 g total) by mouth 3 (three) times daily.   levothyroxine 25 MCG tablet Commonly known as: SYNTHROID Take 25 mcg by mouth daily before breakfast.   multivitamin capsule Take 1 capsule by mouth daily.   pantoprazole 40 MG tablet Commonly known as: PROTONIX Take 10 mg by mouth daily.   polyvinyl alcohol 1.4 % ophthalmic solution Commonly known as: LIQUIFILM TEARS Place 1 drop into both eyes as needed for dry eyes.   Semaglutide (1 MG/DOSE) 2 MG/1.5ML Sopn Inject 1 mg into the skin every Sunday.   spironolactone 25 MG tablet Commonly known as: ALDACTONE Take 1 tablet (25 mg total) by mouth daily.   testosterone cypionate 200 MG/ML injection Commonly known as: DEPOTESTOSTERONE CYPIONATE Inject 200 mg into the muscle every  14 (fourteen) days. Given every other Sunday   Vitamin C 250 MG Tabs Take 500 mg by mouth 3 (three) times a week.   Zinc 50 MG Tabs Take 1 tablet (50 mg total) by mouth daily.        Follow-up Information     Pyrtle, Carie Caddy, MD. Call in 1 week(s).   Specialty: Gastroenterology Contact information: [address] [place] [address] [phone]                Discharge Exam: Ceasar Mons Weights   08/18/22 2000  Weight: 91.8 kg   General -Elderly Caucasian male, sitting, no apparent distress HEENT - PERRLA, EOMI, atraumatic head, non tender sinuses. Lung - Clear, rales, rhonchi, wheezes. Heart - S1, S2 heard, no murmurs, rubs, 1+ pedal edema. Abdomen soft, nontender, distended. + fluid thrill Neuro - Alert, awake and oriented x 3, non focal exam. Skin - Warm and dry.  Condition at discharge: stable  The results of significant diagnostics from this hospitalization (including  imaging, microbiology, ancillary and laboratory) are listed below for reference.   Imaging Studies: DG Chest 2 View  Result Date: 08/18/2022 CLINICAL DATA:  Altered mental status, evaluate for infection EXAM: CHEST - 2 VIEW COMPARISON:  05/25/2022 FINDINGS: Transverse diameter of heart is increased. Metallic sutures are seen in the sternum. There is metallic clamp in region of left atrial appendage. Pacemaker battery is seen in the left infraclavicular region with tips of leads in right atrium and right ventricle. There are no signs of alveolar pulmonary edema or focal pulmonary consolidation. There is no pleural effusion or pneumothorax. IMPRESSION: Cardiomegaly. There are no signs of pulmonary edema or focal pulmonary consolidation. Electronically Signed   By: Ernie Avena M.D.   On: 08/18/2022 15:12   CT HEAD WO CONTRAST ( )  Result Date: 08/18/2022 CLINICAL DATA:  Mental status changes. EXAM: CT HEAD WITHOUT CONTRAST TECHNIQUE: Contiguous axial images were obtained from the base of the skull through the vertex without intravenous contrast. RADIATION DOSE REDUCTION: This exam was performed according to the departmental dose-optimization program which includes automated exposure control, adjustment of the mA and/or kV according to patient size and/or use of iterative reconstruction technique. COMPARISON:  09/30/2020 FINDINGS: Brain: No evidence for acute hemorrhage, hydrocephalus, or midline shift. No definite CT evidence for acute ischemia. 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 Stable 3.7 x 2.5 cm (3.9 x 2.6 cm reported previously) superior left convexity meningioma without appreciable adjacent edema. As before, lesion does generates some mass-effect on the adjacent cortex. Vascular: No hyperdense vessel or unexpected calcification. Skull: No evidence for fracture. No worrisome lytic or sclerotic lesion. Sinuses/Orbits: The visualized paranasal sinuses and mastoid air cells are clear. Visualized  portions of the globes and intraorbital fat are unremarkable. Other: None IMPRESSION: 1. No acute intracranial abnormality. 2. 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 3. Old right parietal infarct. Electronically Signed   By: Kennith Center M.D.   On: 08/18/2022 14:58    Microbiology: Results for orders placed or performed during the hospital encounter of 08/18/22  Resp panel by RT-PCR (RSV, Flu A&B, Covid) Urine, Clean Catch     Status: None   Collection Time: 08/18/22  3:59 PM   Specimen: Urine, Clean Catch; Nasal Swab  Result Value Ref Range Status   SARS Coronavirus 2 by RT PCR NEGATIVE NEGATIVE Final    Comment: (NOTE) SARS-CoV-2 target nucleic acids are NOT DETECTED.  The SARS-CoV-2 RNA is generally detectable in upper  respiratory specimens during the acute phase of infection. The lowest concentration of SARS-CoV-2 viral copies this assay can detect is 138 copies/mL. A negative result does not preclude SARS-Cov-2 infection and should not be used as the sole basis for treatment or other patient management decisions. A negative result may occur with  improper specimen collection/handling, submission of specimen other than nasopharyngeal swab, presence of viral mutation(s) within the areas targeted by this assay, and inadequate number of viral copies(<138 copies/mL). A negative result must be combined with clinical observations, patient history, and epidemiological information. The expected result is Negative.  Fact Sheet for Patients:  BloggerCourse.com  Fact Sheet for Healthcare Providers:  SeriousBroker.it  This test is no t yet approved or cleared by the [place] FDA and  has been authorized for detection and/or diagnosis of SARS-CoV-2 by FDA under an Emergency Use Authorization (EUA). This EUA will remain  in effect (meaning this test can be used) for the duration of the COVID-19 declaration under Section  564(b)(1) of the Act, 21 U.S.C.section 360bbb-3(b)(1), unless the authorization is terminated  or revoked sooner.       Influenza A by PCR NEGATIVE NEGATIVE Final   Influenza B by PCR NEGATIVE NEGATIVE Final    Comment: (NOTE) The Xpert Xpress SARS-CoV-2/FLU/RSV plus assay is intended as an aid in the diagnosis of influenza from Nasopharyngeal swab specimens and should not be used as a sole basis for treatment. Nasal washings and aspirates are unacceptable for Xpert Xpress SARS-CoV-2/FLU/RSV testing.  Fact Sheet for Patients: BloggerCourse.com  Fact Sheet for Healthcare Providers: SeriousBroker.it  This test is not yet approved or cleared by the [place] FDA and has been authorized for detection and/or diagnosis of SARS-CoV-2 by FDA under an Emergency Use Authorization (EUA). This EUA will remain in effect (meaning this test can be used) for the duration of the COVID-19 declaration under Section 564(b)(1) of the Act, 21 U.S.C. section 360bbb-3(b)(1), unless the authorization is terminated or revoked.     Resp Syncytial Virus by PCR NEGATIVE NEGATIVE Final    Comment: (NOTE) Fact Sheet for Patients: BloggerCourse.com  Fact Sheet for Healthcare Providers: SeriousBroker.it  This test is not yet approved or cleared by the [place] FDA and has been authorized for detection and/or diagnosis of SARS-CoV-2 by FDA under an Emergency Use Authorization (EUA). This EUA will remain in effect (meaning this test can be used) for the duration of the COVID-19 declaration under Section 564(b)(1) of the Act, 21 U.S.C. section 360bbb-3(b)(1), unless the authorization is terminated or revoked.  Performed at [facility], [address] [place], [place] [address]     Labs: CBC: Recent Labs  Lab 08/18/22 1422 08/19/22 0746  WBC 9.4 11.4*  NEUTROABS 7.1  --    HGB 7.2* 7.8*  HCT 25.2* 27.5*  MCV 86.6 86.8  PLT 163 195   Basic Metabolic Panel: Recent Labs  Lab 08/18/22 1422 08/19/22 0746  NA 138 136  K 4.7 4.2  CL 106 105  CO2 22 21*  GLUCOSE 145* 125*  BUN 61* 59*  CREATININE 3.01* 2.67*  CALCIUM 8.9 9.0   Liver Function Tests: Recent Labs  Lab 08/18/22 1422 08/19/22 0746  AST 20 21  ALT 13 15  ALKPHOS 133* 126  BILITOT 1.0 1.3*  PROT 7.5 7.7  ALBUMIN 3.0* 3.1*   CBG: Recent Labs  Lab 08/18/22 2112 08/19/22 0718 08/19/22 1112  GLUCAP 201* 110* 205*    Discharge time spent: greater than 30 minutes.  Signed: Marcelino Duster, MD [facility] 08/19/2022

## 2022-08-22 ENCOUNTER — Telehealth: Payer: Self-pay | Admitting: Internal Medicine

## 2022-08-22 ENCOUNTER — Other Ambulatory Visit: Payer: Self-pay

## 2022-08-22 ENCOUNTER — Telehealth: Payer: Self-pay

## 2022-08-22 NOTE — Telephone Encounter (Signed)
See my chart message

## 2022-08-22 NOTE — Telephone Encounter (Signed)
Inbound call from patients wife requesting a call back to discuss a capsule endoscopy. Please advise.

## 2022-08-22 NOTE — Telephone Encounter (Signed)
Patients wife calling again to follow up on message sent this morning. Please advise.

## 2022-08-22 NOTE — Telephone Encounter (Signed)
Left message for pts wife that the VCE needs to be approved by the [place] and Dr. Rhea Belton also needs to state it is ok to proceed with the VCE. Let her know the spot for Monday is on hold in case we have the approval and we will call them back tomorrow once Dr. Rhea Belton has approved the procedure also.

## 2022-08-22 NOTE — Telephone Encounter (Signed)
PT called to confirm that they would like to schedule the capsule EGD on 8/12. Please call to follow up with any further instructions

## 2022-08-22 NOTE — Telephone Encounter (Signed)
yes

## 2022-08-22 NOTE — Telephone Encounter (Signed)
Patient is calling to schedule capsule endo asap due to IDA. He was seen in the hospital. Please advise if ok to proceed with scheduling VCE. Pts wife is working to get approval from the [place],

## 2022-08-23 ENCOUNTER — Other Ambulatory Visit: Payer: Self-pay

## 2022-08-23 DIAGNOSIS — D509 Iron deficiency anemia, unspecified: Secondary | ICD-10-CM

## 2022-08-23 NOTE — Telephone Encounter (Signed)
Called and left message for pts wife that we have not received a corrected referral from the [place] yet. Let her know we cannot schedule him until we have the referral.

## 2022-08-23 NOTE — Telephone Encounter (Signed)
Pt scheduled for VCE Monday 8/12 at 8:30am. Pt aware of appt and knows instructions have been sent to his mychart.

## 2022-08-24 ENCOUNTER — Other Ambulatory Visit: Payer: Self-pay

## 2022-08-24 ENCOUNTER — Inpatient Hospital Stay (HOSPITAL_COMMUNITY)
Admission: EM | Admit: 2022-08-24 | Discharge: 2022-08-28 | DRG: 811 | Disposition: A | Discharge status: Home / Self Care | Payer: No Typology Code available for payment source | Attending: Internal Medicine | Admitting: Internal Medicine

## 2022-08-24 ENCOUNTER — Encounter (HOSPITAL_COMMUNITY): Payer: Self-pay

## 2022-08-24 DIAGNOSIS — K2289 Other specified disease of esophagus: Secondary | ICD-10-CM | POA: Diagnosis present

## 2022-08-24 DIAGNOSIS — E871 Hypo-osmolality and hyponatremia: Secondary | ICD-10-CM | POA: Diagnosis present

## 2022-08-24 DIAGNOSIS — Z9841 Cataract extraction status, right eye: Secondary | ICD-10-CM

## 2022-08-24 DIAGNOSIS — E872 Acidosis, unspecified: Secondary | ICD-10-CM | POA: Diagnosis present

## 2022-08-24 DIAGNOSIS — J449 Chronic obstructive pulmonary disease, unspecified: Secondary | ICD-10-CM | POA: Diagnosis present

## 2022-08-24 DIAGNOSIS — N179 Acute kidney failure, unspecified: Secondary | ICD-10-CM | POA: Diagnosis present

## 2022-08-24 DIAGNOSIS — Z96652 Presence of left artificial knee joint: Secondary | ICD-10-CM | POA: Diagnosis present

## 2022-08-24 DIAGNOSIS — I10 Essential (primary) hypertension: Secondary | ICD-10-CM | POA: Diagnosis not present

## 2022-08-24 DIAGNOSIS — Z9842 Cataract extraction status, left eye: Secondary | ICD-10-CM

## 2022-08-24 DIAGNOSIS — Z951 Presence of aortocoronary bypass graft: Secondary | ICD-10-CM

## 2022-08-24 DIAGNOSIS — Z9089 Acquired absence of other organs: Secondary | ICD-10-CM

## 2022-08-24 DIAGNOSIS — I8510 Secondary esophageal varices without bleeding: Secondary | ICD-10-CM | POA: Diagnosis present

## 2022-08-24 DIAGNOSIS — K922 Gastrointestinal hemorrhage, unspecified: Secondary | ICD-10-CM | POA: Diagnosis present

## 2022-08-24 DIAGNOSIS — K7460 Unspecified cirrhosis of liver: Secondary | ICD-10-CM | POA: Diagnosis present

## 2022-08-24 DIAGNOSIS — D649 Anemia, unspecified: Secondary | ICD-10-CM | POA: Diagnosis present

## 2022-08-24 DIAGNOSIS — Z83438 Family history of other disorder of lipoprotein metabolism and other lipidemia: Secondary | ICD-10-CM

## 2022-08-24 DIAGNOSIS — K648 Other hemorrhoids: Secondary | ICD-10-CM | POA: Diagnosis present

## 2022-08-24 DIAGNOSIS — E039 Hypothyroidism, unspecified: Secondary | ICD-10-CM | POA: Diagnosis present

## 2022-08-24 DIAGNOSIS — Z950 Presence of cardiac pacemaker: Secondary | ICD-10-CM

## 2022-08-24 DIAGNOSIS — I5032 Chronic diastolic (congestive) heart failure: Secondary | ICD-10-CM | POA: Diagnosis present

## 2022-08-24 DIAGNOSIS — E1122 Type 2 diabetes mellitus with diabetic chronic kidney disease: Secondary | ICD-10-CM | POA: Diagnosis present

## 2022-08-24 DIAGNOSIS — Z888 Allergy status to other drugs, medicaments and biological substances status: Secondary | ICD-10-CM

## 2022-08-24 DIAGNOSIS — Z87891 Personal history of nicotine dependence: Secondary | ICD-10-CM

## 2022-08-24 DIAGNOSIS — I480 Paroxysmal atrial fibrillation: Secondary | ICD-10-CM | POA: Diagnosis present

## 2022-08-24 DIAGNOSIS — E1140 Type 2 diabetes mellitus with diabetic neuropathy, unspecified: Secondary | ICD-10-CM | POA: Diagnosis present

## 2022-08-24 DIAGNOSIS — Z9049 Acquired absence of other specified parts of digestive tract: Secondary | ICD-10-CM

## 2022-08-24 DIAGNOSIS — N184 Chronic kidney disease, stage 4 (severe): Secondary | ICD-10-CM | POA: Diagnosis present

## 2022-08-24 DIAGNOSIS — I872 Venous insufficiency (chronic) (peripheral): Secondary | ICD-10-CM | POA: Diagnosis present

## 2022-08-24 DIAGNOSIS — K644 Residual hemorrhoidal skin tags: Secondary | ICD-10-CM | POA: Diagnosis present

## 2022-08-24 DIAGNOSIS — Z800 Family history of malignant neoplasm of digestive organs: Secondary | ICD-10-CM

## 2022-08-24 DIAGNOSIS — Z79890 Hormone replacement therapy (postmenopausal): Secondary | ICD-10-CM

## 2022-08-24 DIAGNOSIS — Z8719 Personal history of other diseases of the digestive system: Secondary | ICD-10-CM

## 2022-08-24 DIAGNOSIS — D62 Acute posthemorrhagic anemia: Principal | ICD-10-CM | POA: Diagnosis present

## 2022-08-24 DIAGNOSIS — Z8551 Personal history of malignant neoplasm of bladder: Secondary | ICD-10-CM

## 2022-08-24 DIAGNOSIS — K219 Gastro-esophageal reflux disease without esophagitis: Secondary | ICD-10-CM | POA: Diagnosis present

## 2022-08-24 DIAGNOSIS — Z79899 Other long term (current) drug therapy: Secondary | ICD-10-CM

## 2022-08-24 DIAGNOSIS — I2510 Atherosclerotic heart disease of native coronary artery without angina pectoris: Secondary | ICD-10-CM | POA: Diagnosis present

## 2022-08-24 DIAGNOSIS — R188 Other ascites: Secondary | ICD-10-CM | POA: Diagnosis present

## 2022-08-24 DIAGNOSIS — E119 Type 2 diabetes mellitus without complications: Secondary | ICD-10-CM

## 2022-08-24 DIAGNOSIS — Z794 Long term (current) use of insulin: Secondary | ICD-10-CM

## 2022-08-24 DIAGNOSIS — K31811 Angiodysplasia of stomach and duodenum with bleeding: Secondary | ICD-10-CM | POA: Diagnosis present

## 2022-08-24 DIAGNOSIS — K3189 Other diseases of stomach and duodenum: Secondary | ICD-10-CM | POA: Diagnosis present

## 2022-08-24 DIAGNOSIS — E8809 Other disorders of plasma-protein metabolism, not elsewhere classified: Secondary | ICD-10-CM | POA: Diagnosis present

## 2022-08-24 DIAGNOSIS — K449 Diaphragmatic hernia without obstruction or gangrene: Secondary | ICD-10-CM | POA: Diagnosis present

## 2022-08-24 DIAGNOSIS — R161 Splenomegaly, not elsewhere classified: Secondary | ICD-10-CM | POA: Diagnosis present

## 2022-08-24 DIAGNOSIS — Z8249 Family history of ischemic heart disease and other diseases of the circulatory system: Secondary | ICD-10-CM

## 2022-08-24 DIAGNOSIS — Z86010 Personal history of colonic polyps: Secondary | ICD-10-CM

## 2022-08-24 DIAGNOSIS — Z7901 Long term (current) use of anticoagulants: Secondary | ICD-10-CM

## 2022-08-24 DIAGNOSIS — K766 Portal hypertension: Secondary | ICD-10-CM | POA: Diagnosis present

## 2022-08-24 DIAGNOSIS — E785 Hyperlipidemia, unspecified: Secondary | ICD-10-CM | POA: Diagnosis present

## 2022-08-24 DIAGNOSIS — I130 Hypertensive heart and chronic kidney disease with heart failure and stage 1 through stage 4 chronic kidney disease, or unspecified chronic kidney disease: Secondary | ICD-10-CM | POA: Diagnosis present

## 2022-08-24 DIAGNOSIS — E1165 Type 2 diabetes mellitus with hyperglycemia: Secondary | ICD-10-CM | POA: Diagnosis present

## 2022-08-24 DIAGNOSIS — E1151 Type 2 diabetes mellitus with diabetic peripheral angiopathy without gangrene: Secondary | ICD-10-CM | POA: Diagnosis present

## 2022-08-24 DIAGNOSIS — Z8673 Personal history of transient ischemic attack (TIA), and cerebral infarction without residual deficits: Secondary | ICD-10-CM

## 2022-08-24 DIAGNOSIS — I272 Pulmonary hypertension, unspecified: Secondary | ICD-10-CM | POA: Diagnosis present

## 2022-08-24 DIAGNOSIS — K921 Melena: Secondary | ICD-10-CM

## 2022-08-24 HISTORY — DX: Gastritis, unspecified, without bleeding: K29.70

## 2022-08-24 LAB — COMPREHENSIVE METABOLIC PANEL
ALT: 16 U/L (ref 0–44)
AST: 21 U/L (ref 15–41)
Albumin: 3.1 g/dL — ABNORMAL LOW (ref 3.5–5.0)
Alkaline Phosphatase: 151 U/L — ABNORMAL HIGH (ref 38–126)
Anion gap: 11 (ref 5–15)
BUN: 71 mg/dL — ABNORMAL HIGH (ref 8–23)
CO2: 22 mmol/L (ref 22–32)
Calcium: 8.6 mg/dL — ABNORMAL LOW (ref 8.9–10.3)
Chloride: 99 mmol/L (ref 98–111)
Creatinine, Ser: 3.69 mg/dL — ABNORMAL HIGH (ref 0.61–1.24)
GFR, Estimated: 16 mL/min — ABNORMAL LOW (ref >60)
Glucose, Bld: 111 mg/dL — ABNORMAL HIGH (ref 70–99)
Potassium: 5.3 mmol/L — ABNORMAL HIGH (ref 3.5–5.1)
Sodium: 132 mmol/L — ABNORMAL LOW (ref 135–145)
Total Bilirubin: 1 mg/dL (ref 0.3–1.2)
Total Protein: 7.9 g/dL (ref 6.5–8.1)

## 2022-08-24 LAB — TYPE AND SCREEN
ABO/RH(D): O NEG
Antibody Screen: NEGATIVE
Unit division: 0
Unit division: 0

## 2022-08-24 LAB — CBC WITH DIFFERENTIAL/PLATELET
Abs Immature Granulocytes: 0.09 10*3/uL — ABNORMAL HIGH (ref 0.00–0.07)
Basophils Absolute: 0.1 10*3/uL (ref 0.0–0.1)
Basophils Relative: 1 %
Eosinophils Absolute: 0.5 10*3/uL (ref 0.0–0.5)
Eosinophils Relative: 6 %
HCT: 25 % — ABNORMAL LOW (ref 39.0–52.0)
Hemoglobin: 7.2 g/dL — ABNORMAL LOW (ref 13.0–17.0)
Immature Granulocytes: 1 %
Lymphocytes Relative: 7 %
Lymphs Abs: 0.7 10*3/uL (ref 0.7–4.0)
MCH: 24.5 pg — ABNORMAL LOW (ref 26.0–34.0)
MCHC: 28.8 g/dL — ABNORMAL LOW (ref 30.0–36.0)
MCV: 85 fL (ref 80.0–100.0)
Monocytes Absolute: 1.4 10*3/uL — ABNORMAL HIGH (ref 0.1–1.0)
Monocytes Relative: 15 %
Neutro Abs: 6.7 10*3/uL (ref 1.7–7.7)
Neutrophils Relative %: 70 %
Platelets: 230 10*3/uL (ref 150–400)
RBC: 2.94 MIL/uL — ABNORMAL LOW (ref 4.22–5.81)
RDW: 19.1 % — ABNORMAL HIGH (ref 11.5–15.5)
WBC: 9.5 10*3/uL (ref 4.0–10.5)
nRBC: 0 % (ref 0.0–0.2)

## 2022-08-24 LAB — POC OCCULT BLOOD, ED: Fecal Occult Bld: POSITIVE — AB

## 2022-08-24 LAB — GLUCOSE, CAPILLARY: Glucose-Capillary: 170 mg/dL — ABNORMAL HIGH (ref 70–99)

## 2022-08-24 LAB — BPAM RBC
Blood Product Expiration Date: 202409112359
Blood Product Expiration Date: 202409122359
ISSUE DATE / TIME: 202408092146
Unit Type and Rh: 9500
Unit Type and Rh: 9500

## 2022-08-24 LAB — POTASSIUM: Potassium: 4.7 mmol/L (ref 3.5–5.1)

## 2022-08-24 LAB — PREPARE RBC (CROSSMATCH)

## 2022-08-24 MED ORDER — MELATONIN 5 MG PO TABS
10.0000 mg | ORAL_TABLET | Freq: Every evening | ORAL | Status: DC | PRN
  Administered 2022-08-27: 10 mg via ORAL
  Filled 2022-08-24: qty 2

## 2022-08-24 MED ORDER — ALLOPURINOL 100 MG PO TABS
100.0000 mg | ORAL_TABLET | Freq: Every day | ORAL | Status: DC
  Administered 2022-08-25 – 2022-08-28 (×4): 100 mg via ORAL
  Filled 2022-08-24 (×4): qty 1

## 2022-08-24 MED ORDER — CARVEDILOL 6.25 MG PO TABS
6.2500 mg | ORAL_TABLET | Freq: Two times a day (BID) | ORAL | Status: DC
  Administered 2022-08-24 – 2022-08-28 (×4): 6.25 mg via ORAL
  Filled 2022-08-24 (×6): qty 1

## 2022-08-24 MED ORDER — INSULIN GLARGINE-YFGN 100 UNIT/ML ~~LOC~~ SOLN
40.0000 [IU] | Freq: Every day | SUBCUTANEOUS | Status: DC
  Administered 2022-08-25 – 2022-08-27 (×3): 40 [IU] via SUBCUTANEOUS
  Filled 2022-08-24 (×4): qty 0.4

## 2022-08-24 MED ORDER — INSULIN ASPART 100 UNIT/ML IJ SOLN: 0.0000 [IU] | Freq: Every day | INTRAMUSCULAR | Status: DC

## 2022-08-24 MED ORDER — SODIUM CHLORIDE 0.9% IV SOLUTION: Freq: Once | INTRAVENOUS | Status: DC

## 2022-08-24 MED ORDER — PANTOPRAZOLE SODIUM 40 MG PO TBEC
40.0000 mg | DELAYED_RELEASE_TABLET | Freq: Every day | ORAL | Status: DC
  Administered 2022-08-25 – 2022-08-26 (×2): 40 mg via ORAL
  Filled 2022-08-24 (×2): qty 1

## 2022-08-24 MED ORDER — ACETAMINOPHEN 325 MG PO TABS: 650.0000 mg | ORAL_TABLET | Freq: Four times a day (QID) | ORAL | Status: DC | PRN

## 2022-08-24 MED ORDER — ONDANSETRON HCL 4 MG PO TABS: 4.0000 mg | ORAL_TABLET | Freq: Four times a day (QID) | ORAL | Status: DC | PRN

## 2022-08-24 MED ORDER — INSULIN ASPART 100 UNIT/ML IJ SOLN
0.0000 [IU] | Freq: Three times a day (TID) | INTRAMUSCULAR | Status: DC
  Administered 2022-08-25: 3 [IU] via SUBCUTANEOUS
  Administered 2022-08-25: 2 [IU] via SUBCUTANEOUS
  Administered 2022-08-26 – 2022-08-28 (×2): 1 [IU] via SUBCUTANEOUS

## 2022-08-24 MED ORDER — ACETAMINOPHEN 650 MG RE SUPP: 650.0000 mg | Freq: Four times a day (QID) | RECTAL | Status: DC | PRN

## 2022-08-24 MED ORDER — ISOSORBIDE MONONITRATE ER 30 MG PO TB24
30.0000 mg | ORAL_TABLET | Freq: Every day | ORAL | Status: DC
  Administered 2022-08-25 – 2022-08-28 (×4): 30 mg via ORAL
  Filled 2022-08-24 (×4): qty 1

## 2022-08-24 MED ORDER — PANTOPRAZOLE SODIUM 40 MG IV SOLR
40.0000 mg | Freq: Once | INTRAVENOUS | Status: AC
  Administered 2022-08-24: 40 mg via INTRAVENOUS
  Filled 2022-08-24 (×2): qty 10

## 2022-08-24 MED ORDER — ONDANSETRON HCL 4 MG/2ML IJ SOLN
4.0000 mg | Freq: Four times a day (QID) | INTRAMUSCULAR | Status: DC | PRN
  Administered 2022-08-26: 4 mg via INTRAVENOUS
  Filled 2022-08-24: qty 2

## 2022-08-24 MED ORDER — LEVOTHYROXINE SODIUM 25 MCG PO TABS
25.0000 ug | ORAL_TABLET | Freq: Every day | ORAL | Status: DC
  Administered 2022-08-25 – 2022-08-28 (×4): 25 ug via ORAL
  Filled 2022-08-24 (×4): qty 1

## 2022-08-24 NOTE — ED Notes (Signed)
ED TO INPATIENT HANDOFF REPORT  Name/Age/Gender Edward [name] 82 y.o. male  Code Status    Code Status Orders  (From admission, onward)           Start     Ordered   08/24/22 1740  Full code  Continuous       Question:  By:  Answer:  Consent: discussion documented in EHR   08/24/22 1739           Code Status History     Date Active Date Inactive Code Status Order ID Comments User Context   08/18/2022 1714 08/19/2022 1756 Full Code [id number]  Maryln Gottron, MD ED   05/25/2022 1347 05/26/2022 1951 Full Code [id number]  Masters, Florentina Addison, DO ED   03/20/2022 1609 03/25/2022 2027 Full Code [id number]  Celine Mans, MD ED   05/06/2021 1544 05/09/2021 1732 Full Code [id number]  Jonah Blue, MD ED   03/16/2021 1738 03/19/2021 1847 Full Code [id number]  Merrilyn Puma, MD ED   11/05/2012 2022 11/06/2012 1346 Full Code [id number]  Duke Salvia, MD Inpatient   03/06/2012 1449 03/09/2012 1233 Full Code [id number]  Marlowe Shores, [place] Inpatient   06/18/2011 0954 06/22/2011 1449 Full Code [id number]  Benjamine Sprague, RN Inpatient   06/14/2011 1618 06/18/2011 0954 Full Code [id number]  Theron Arista, RN Inpatient       Home/SNF/Other Home  Chief Complaint Symptomatic anemia [D64.9]  Level of Care/Admitting Diagnosis ED Disposition     ED Disposition  Admit   Condition  --   Comment  Hospital Area: [facility] [[id number]]  Level of Care: Med-Surg [[id number]]  May place patient in observation at [facility] or [facility] if equivalent level of care is available:: No  Covid Evaluation: Asymptomatic - no recent exposure (last 10 days) testing not required  Diagnosis: Symptomatic anemia [0350093]  Admitting Physician: Imogene Burn ERIC [[id number]]  Attending Physician: Imogene Burn, ERIC [[id number]]          Medical History Past Medical History:  Diagnosis Date   Acute on chronic diastolic heart failure (HCC) 03/20/2022   Anemia    ?   Carotid artery occlusion    Chronic diastolic CHF  (congestive heart failure) (HCC) 04/23/2012   CKD (chronic kidney disease), stage III (HCC)    Claudication (HCC)    Coronary artery disease    s/p CABG 2013   Diabetic neuropathy (HCC)    mostly feet/ legs   Diverticulosis    Dyslipidemia    Exertional shortness of breath    Fracture of one rib, left side, initial encounter for closed fracture 02/01/2021   Gastritis and gastroduodenitis    GERD (gastroesophageal reflux disease)    GI bleed    while on triple anticoag therapy   HTN (hypertension)    Pacemaker    PAF (paroxysmal atrial fibrillation) (HCC)    s/p MAZE at time of CABG   PVD (peripheral vascular disease) (HCC)    R CEA 2011, prior PTA, left fem-pop 2014   Stroke (HCC)    vs TIA   Tubular adenoma of colon    Type II diabetes mellitus (HCC)     Allergies Allergies  Allergen Reactions   Lipitor [Atorvastatin Calcium] Other (See Comments)    Weakness/ pain in legs    IV Location/Drains/Wounds Patient Lines/Drains/Airways Status     Active Line/Drains/Airways     Name Placement date Placement time Site Days   Peripheral IV 08/24/22 20 G Left Antecubital 08/24/22  1607  Antecubital  less than 1   Wound / Incision (Open or Dehisced) 03/18/21 Skin tear Arm Posterior;Right;Upper 03/18/21  1940  Arm  524            Labs/Imaging Results for orders placed or performed during the hospital encounter of 08/24/22 (from the past 48 hour(s))  CBC with Differential     Status: Abnormal   Collection Time: 08/24/22  3:43 PM  Result Value Ref Range   WBC 9.5 4.0 - 10.5 K/uL   RBC 2.94 (L) 4.22 - 5.81 MIL/uL   Hemoglobin 7.2 (L) 13.0 - 17.0 g/dL   HCT 88.4 (L) 16.6 - 06.3 %   MCV 85.0 80.0 - 100.0 fL   MCH 24.5 (L) 26.0 - 34.0 pg   MCHC 28.8 (L) 30.0 - 36.0 g/dL   RDW 01.6 (H) 01.0 - 93.2 %   Platelets 230 150 - 400 K/uL   nRBC 0.0 0.0 - 0.2 %   Neutrophils Relative % 70 %   Neutro Abs 6.7 1.7 - 7.7 K/uL   Lymphocytes Relative 7 %   Lymphs Abs 0.7 0.7 - 4.0  K/uL   Monocytes Relative 15 %   Monocytes Absolute 1.4 (H) 0.1 - 1.0 K/uL   Eosinophils Relative 6 %   Eosinophils Absolute 0.5 0.0 - 0.5 K/uL   Basophils Relative 1 %   Basophils Absolute 0.1 0.0 - 0.1 K/uL   Immature Granulocytes 1 %   Abs Immature Granulocytes 0.09 (H) 0.00 - 0.07 K/uL    Comment: Performed at [facility], [address] [place], [place] [address]  Comprehensive metabolic panel     Status: Abnormal   Collection Time: 08/24/22  3:43 PM  Result Value Ref Range   Sodium 132 (L) 135 - 145 mmol/L   Potassium 5.3 (H) 3.5 - 5.1 mmol/L   Chloride 99 98 - 111 mmol/L   CO2 22 22 - 32 mmol/L   Glucose, Bld 111 (H) 70 - 99 mg/dL    Comment: Glucose reference range applies only to samples taken after fasting for at least 8 hours.   BUN 71 (H) 8 - 23 mg/dL   Creatinine, Ser 2.20 (H) 0.61 - 1.24 mg/dL   Calcium 8.6 (L) 8.9 - 10.3 mg/dL   Total Protein 7.9 6.5 - 8.1 g/dL   Albumin 3.1 (L) 3.5 - 5.0 g/dL   AST 21 15 - 41 U/L   ALT 16 0 - 44 U/L   Alkaline Phosphatase 151 (H) 38 - 126 U/L   Total Bilirubin 1.0 0.3 - 1.2 mg/dL   GFR, Estimated 16 (L) >60 mL/min    Comment: (NOTE) Calculated using the CKD-EPI Creatinine Equation (2021)    Anion gap 11 5 - 15    Comment: Performed at [facility], [address] [place], [place] [address]  Type and screen [facility]     Status: None   Collection Time: 08/24/22  3:44 PM  Result Value Ref Range   ABO/RH(D) O NEG    Antibody Screen NEG    Sample Expiration      08/27/2022,2359 Performed at [facility], [address], [place], [place] [address]   POC occult blood, ED     Status: Abnormal   Collection Time: 08/24/22  3:49 PM  Result Value Ref Range   Fecal Occult Bld POSITIVE (A) NEGATIVE  Potassium     Status: None   Collection Time: 08/24/22  4:53  PM  Result Value Ref Range   Potassium 4.7 3.5 - 5.1 mmol/L    Comment: Performed at [facility], [address], [place], [place] [address]   No results found.  Pending Labs [employer] (From admission, onward)     Start     Ordered   08/24/22 1757  Prepare RBC (crossmatch)  (Blood Administration Adult)  Once,   R       Question Answer Comment  # of Units 1 unit   Transfusion Indications Other   Comments GI bleed, symptomatic   Number of Units to Keep Ahead NO units ahead   If emergent release call blood bank Not emergent release      08/24/22 1756            Vitals/Pain Today's Vitals   08/24/22 1441 08/24/22 1456 08/24/22 1605 08/24/22 1700  BP: (!) 124/57  120/65 116/61  Pulse: 63  61 65  Resp: 19  18 18   Temp: 97.7 F (36.5 C)     TempSrc: Oral     SpO2: 100%  100% 100%  Weight: 90.7 kg 90 kg    Height: 5\' 10"  (1.778 m) 5\' 10"  (1.778 m)    PainSc:  0-No pain      Isolation Precautions No active isolations  Medications Medications  0.9 %  sodium chloride infusion (Manually program via Guardrails IV Fluids) (has no administration in time range)  0.9 %  sodium chloride infusion (Manually program via Guardrails IV Fluids) (has no administration in time range)  pantoprazole (PROTONIX) injection 40 mg (40 mg Intravenous Given 08/24/22 1609)    Mobility walks

## 2022-08-24 NOTE — Assessment & Plan Note (Signed)
Stable. Has PPM. Off Eliquis this week due to intermittent GI bleeding.

## 2022-08-24 NOTE — Assessment & Plan Note (Signed)
Likely due to acute on chronic anemia, continued use of lasix/aldactone at home. Hopefully, transfusion will help his renal perfusion. Trying to avoid giving IVF due to hx of anasarca, CKD stage 4 and diastolic CHF.

## 2022-08-24 NOTE — Telephone Encounter (Signed)
[place] making [place] aware [facility], [facility] had [facility] work and was ordered to go over to the hospital asap. Patient at [facility] ED.

## 2022-08-24 NOTE — Telephone Encounter (Signed)
Noted  

## 2022-08-24 NOTE — ED Provider Notes (Signed)
[place] [facility] AT [facility] Provider Note   CSN: [id number] Arrival date & time: 08/24/22  1347     History  Chief Complaint  Patient presents with   Weakness    Edward [name] is a 82 y.o. male history of A-fib, type 2 diabetes,pacemaker on Eliquis, who presents to the ED secondary to black stools, and being called by [facility] for hemoglobin of 7.1.  Wife states he was admitted last week, for low hemoglobin, and given blood.  She states that he has been more fatigued.  Denies any dizziness or shortness of breath however.  States he has short of breath at baseline.  Notes that he stopped taking his blood thinner about 2 days ago.  Has not taken iron supplements for 2 days.  Sees Dr. Rhea Belton, [facility].   Home Medications Prior to Admission medications   Medication Sig Start Date End Date Taking? Authorizing Provider  acetaminophen (TYLENOL) 650 MG CR tablet Take 100 mg by mouth daily.    [provider]  albuterol (PROVENTIL HFA;VENTOLIN HFA) 108 (90 Base) MCG/ACT inhaler USE 2 PUFFS EVERY 4 HOURS AS NEEDED FOR WHEEZING Patient not taking: Reported on 03/20/2022 11/11/17   Nahser, Deloris Ping, MD  allopurinol (ZYLOPRIM) 100 MG tablet Take 300 mg by mouth daily.    [provider]  apixaban (ELIQUIS) 2.5 MG TABS tablet Take 2.5 mg by mouth 2 (two) times daily.    [provider]  ascorbic acid (VITAMIN C) 500 MG tablet Take 1,000 mg by mouth every Monday, Wednesday, and Friday.    [provider]  carvedilol (COREG) 6.25 MG tablet Take 1 tablet (6.25 mg total) by mouth 2 (two) times daily. 02/24/19   Nahser, Deloris Ping, MD  cetirizine (ZYRTEC) 10 MG tablet Take 10 mg by mouth daily.    [provider]  cholecalciferol (VITAMIN D3) 25 MCG (1000 UNIT) tablet Take 2,000 Units by mouth daily.    [provider]  cyanocobalamin 1000 MCG tablet Take 1,000 mcg by mouth daily.    [provider]  ferrous sulfate 325  (65 FE) MG tablet Take 650 mg by mouth every Monday, Wednesday, and Friday.    [provider]  furosemide (LASIX) 40 MG tablet Take 1.5-2 tablets (60-80 mg total) by mouth See admin instructions. Take 2 tablets by mouth in the morning and 1.5 tablets in the evening Patient taking differently: Take 20 mg by mouth 2 (two) times daily. 05/26/22   Masters, Katie, DO  gabapentin (NEURONTIN) 100 MG capsule Take 200-300 mg by mouth 2 (two) times daily. Take 2 capsules by mouth in the morning and 3 capsules in the evening    [provider]  insulin glargine (LANTUS) 100 UNIT/ML injection Inject 48 Units into the skin in the morning.    [provider]  isosorbide mononitrate (IMDUR) 30 MG 24 hr tablet Take 30 mg by mouth daily.    [provider]  lactulose (CHRONULAC) 10 GM/15ML solution Take 15 mLs (10 g total) by mouth 3 (three) times daily. 08/19/22   Marcelino Duster, MD  levothyroxine (SYNTHROID) 25 MCG tablet Take 25 mcg by mouth daily before breakfast.    [provider]  Multiple Vitamin (MULTIVITAMIN) capsule Take 1 capsule by mouth daily.    [provider]  pantoprazole (PROTONIX) 40 MG tablet Take 10 mg by mouth daily.    [provider]  polyvinyl alcohol (LIQUIFILM TEARS) 1.4 % ophthalmic solution Place 1  drop into both eyes as needed for dry eyes. Patient not taking: Reported on 08/18/2022 03/25/22   [name], MD  Semaglutide, 1 MG/DOSE, 2 MG/1.5ML SOPN Inject 1 mg into the skin every Sunday.    [provider]  spironolactone (ALDACTONE) 25 MG tablet Take 1 tablet (25 mg total) by mouth daily. 05/26/22   Masters, Katie, DO  testosterone cypionate (DEPOTESTOSTERONE CYPIONATE) 200 MG/ML injection Inject 200 mg into the muscle every 14 (fourteen) days. Given every other Sunday    [provider]  Vitamin C 250 MG TABS Take 500 mg by mouth 3 (three) times a week.    [provider]  Zinc 50 MG TABS  Take 1 tablet (50 mg total) by mouth daily. 03/25/22   [name], MD      Allergies    Lipitor [atorvastatin calcium]    Review of Systems   Review of Systems  Neurological:  Positive for weakness.    Physical Exam Updated Vital Signs BP 120/65   Pulse 61   Temp 97.7 F (36.5 C) (Oral)   Resp 18   Ht 5\' 10"  (1.778 m)   Wt 90 kg   SpO2 100%   BMI 28.47 kg/m  Physical Exam Vitals and nursing note reviewed.  Constitutional:      General: He is not in acute distress.    Appearance: He is well-developed.  HENT:     Head: Normocephalic and atraumatic.  Eyes:     Conjunctiva/sclera: Conjunctivae normal.  Cardiovascular:     Rate and Rhythm: Normal rate and regular rhythm.     Heart sounds: No murmur heard. Pulmonary:     Effort: Pulmonary effort is normal. No respiratory distress.     Breath sounds: Normal breath sounds.  Abdominal:     Palpations: Abdomen is soft.     Tenderness: There is no abdominal tenderness.  Genitourinary:    Comments: +Positive hemoccult. No hemorrhoids noted. Chaperone present.  Musculoskeletal:        General: No swelling.     Cervical back: Neck supple.  Skin:    General: Skin is warm and dry.     Capillary Refill: Capillary refill takes less than 2 seconds.  Neurological:     Mental Status: He is alert.  Psychiatric:        Mood and Affect: Mood normal.     ED Results / Procedures / Treatments   Labs (all labs ordered are listed, but only abnormal results are displayed) Labs Reviewed  CBC WITH DIFFERENTIAL/PLATELET - Abnormal; Notable for the following components:      Result Value   RBC 2.94 (*)    Hemoglobin 7.2 (*)    HCT 25.0 (*)    MCH 24.5 (*)    MCHC 28.8 (*)    RDW 19.1 (*)    Monocytes Absolute 1.4 (*)    Abs Immature Granulocytes 0.09 (*)    All other components within normal limits  COMPREHENSIVE METABOLIC PANEL - Abnormal; Notable for the following components:   Sodium 132 (*)    Potassium 5.3 (*)     Glucose, Bld 111 (*)    BUN 71 (*)    Creatinine, Ser 3.69 (*)    Calcium 8.6 (*)    Albumin 3.1 (*)    Alkaline Phosphatase 151 (*)    GFR, Estimated 16 (*)    All other components within normal limits  POC OCCULT BLOOD, ED - Abnormal; Notable for the following components:  Fecal Occult Bld POSITIVE (*)    All other components within normal limits  POTASSIUM  POC OCCULT BLOOD, ED  TYPE AND SCREEN    EKG None  Radiology No results found.  Procedures Procedures    Medications Ordered in ED Medications  0.9 %  sodium chloride infusion (Manually program via Guardrails IV Fluids) (has no administration in time range)  pantoprazole (PROTONIX) injection 40 mg (40 mg Intravenous Given 08/24/22 1609)    ED Course/ Medical Decision Making/ A&P                                 Medical Decision Making Patient is an 82 year old male, here for increased fatigue, blood in stool, and low hemoglobin per patient.  We will obtain CBC, CMP, Hemoccult, type and screen for further evaluation.  He does have melena on exam.  Amount and/or Complexity of Data Reviewed Labs: ordered.    Details: Hemoglobin of 7.2, creatinine of 3.69 Discussion of management or test interpretation with external provider(s): Patient found to have AKI, with creatinine at 3.69, hemoglobin got, low again at 7.2.  Symptomatic, will transfuse 1 unit, provide with IV fluids.  Will need to be further managed inpatient wise.  Excepted by Dr. Claudie Fisherman, MD, for hospitalist admission secondary to GI bleed, and AKI.  Risk Prescription drug management. Decision regarding hospitalization.    Final Clinical Impression(s) / ED Diagnoses Final diagnoses:  AKI (acute kidney injury) [facility])  Gastrointestinal hemorrhage with melena    Rx / DC Orders ED Discharge Orders     None         , Harley Alto, PA 08/24/22 1730    Loetta Rough, MD 08/29/22 2034

## 2022-08-24 NOTE — H&P (Signed)
History and Physical    Edward [name] ZOX:[id number] DOB: [birth year] DOA: 08/24/2022  DOS: the patient was seen and examined on 08/24/2022  PCP: Byrd Hesselbach, PA   Patient coming from: Home  I have personally briefly reviewed patient's old medical records in [facility]  CC: lethargy, weakness HPI: 82 year old male with a history of chronic diastolic heart failure, CKD stage IV, history of paroxysmal atrial fibrillation, recurrent GI bleeding, history of cirrhosis of the liver, chronic anemia, permanent pacemaker, presents to the ER today with worsening fatigue and lethargy.  He had been admitted to the hospital last week for acute kidney injury.  He was given IV fluids.  Diuretics were held.  He is also given a unit of blood.  Patient's had intermittent blood in his stools.  He is undergoing workup by GI.  He is ready had a colonoscopy and upper endoscopy last year.  He is scheduled for a capsule endoscopy this coming week.  He has been having increasing fatigue at home.  No fever.  No diarrhea.  On arrival temp 97.7 heart rate 63 blood pressure 124/57 satting high percent on room air.  Sodium 132, potassium 5.3, BUN of 71, creatinine 3.69, glucose 111  Calcium 8.6, Total protein 7.9, albumin 3.1  Alk phos 151  White count 9.5, hemoglobin 7.2, platelets of 230  When he was just discharged from the hospital last week, his discharge hemoglobin was 7.8.  BUN and creatinine at the time of discharge was 59/2.67.  Due to the patient's symptomatic anemia, [facility] hospitalist contacted for admission.   ED Course: Hgb 72., BUN 71, Scr 3.69  Review of Systems:  Review of Systems  Constitutional:  Positive for malaise/fatigue.  HENT: Negative.    Eyes: Negative.   Respiratory: Negative.    Cardiovascular:  Positive for leg swelling.       Chronic leg swelling.  Gastrointestinal:  Positive for blood in stool. Negative for abdominal pain and vomiting.       Only intermittently  per wife. No bright red blood consistently.  Musculoskeletal:  Positive for back pain.  Skin: Negative.   Neurological: Negative.   Endo/Heme/Allergies: Negative.   Psychiatric/Behavioral: Negative.    All other systems reviewed and are negative.   Past Medical History:  Diagnosis Date   Acute on chronic diastolic heart failure (HCC) 03/20/2022   Anemia    ?   Carotid artery occlusion    Chronic diastolic CHF (congestive heart failure) (HCC) 04/23/2012   CKD (chronic kidney disease), stage III (HCC)    Claudication (HCC)    Coronary artery disease    s/p CABG 2013   Diabetic neuropathy (HCC)    mostly feet/ legs   Diverticulosis    Dyslipidemia    Exertional shortness of breath    Fracture of one rib, left side, initial encounter for closed fracture 02/01/2021   Gastritis and gastroduodenitis    GERD (gastroesophageal reflux disease)    GI bleed    while on triple anticoag therapy   HTN (hypertension)    Pacemaker    PAF (paroxysmal atrial fibrillation) (HCC)    s/p MAZE at time of CABG   PVD (peripheral vascular disease) (HCC)    R CEA 2011, prior PTA, left fem-pop 2014   Stroke (HCC)    vs TIA   Tubular adenoma of colon    Type II diabetes mellitus (HCC)     Past Surgical History:  Procedure Laterality Date   ABDOMINAL AORTAGRAM N/A  01/07/2012   Procedure: ABDOMINAL Ronny Flurry;  Surgeon: Chuck Hint, MD;  Location: [facility] CATH LAB;  Service: Cardiovascular;  Laterality: N/A;   BALLOON DILATION N/A 02/17/2013   Procedure: BALLOON DILATION;  Surgeon: Beverley Fiedler, MD;  Location: [facility] ENDOSCOPY;  Service: Gastroenterology;  Laterality: N/A;   BIOPSY  03/17/2021   Procedure: BIOPSY;  Surgeon: Iva Boop, MD;  [facility] ENDOSCOPY;  Service: Gastroenterology;;   CAROTID ENDARTERECTOMY Right 03/2009   CATARACT EXTRACTION Bilateral    CHOLECYSTECTOMY     CIRCUMCISION     COLONOSCOPY WITH PROPOFOL N/A 02/17/2013   Procedure: COLONOSCOPY WITH PROPOFOL;  Surgeon: Beverley Fiedler, MD;  Location: [facility] ENDOSCOPY;  Service: Gastroenterology;  Laterality: N/A;   COLONOSCOPY WITH PROPOFOL N/A 03/18/2021   Procedure: COLONOSCOPY WITH PROPOFOL;  Surgeon: Jeani Hawking, MD;  Location: [facility] ENDOSCOPY;  Service: Gastroenterology;  Laterality: N/A;   CORONARY ARTERY BYPASS GRAFT  06/14/2011   Procedure: CORONARY ARTERY BYPASS GRAFTING (CABG);  Surgeon: Alleen Borne, MD;  Location: [facility] OR;  Service: Open Heart Surgery;  Laterality: N/A;  Coronary Artery Bypass Graft on pump times six;  utilizing internal mammary artery and right greater saphenous vein harvested endoscopically.x 5 vessels   ELECTROPHYSIOLOGY STUDY N/A 11/05/2012   Procedure: ELECTROPHYSIOLOGY STUDY;  Surgeon: Duke Salvia, MD;  Location: [facility] CATH LAB;  Service: Cardiovascular;  Laterality: N/A;   ESOPHAGOGASTRODUODENOSCOPY (EGD) WITH PROPOFOL N/A 02/17/2013   Procedure: ESOPHAGOGASTRODUODENOSCOPY (EGD) WITH PROPOFOL;  Surgeon: Beverley Fiedler, MD;  Location: [facility] ENDOSCOPY;  Service: Gastroenterology;  Laterality: N/A;   ESOPHAGOGASTRODUODENOSCOPY (EGD) WITH PROPOFOL N/A 03/17/2021   Procedure: ESOPHAGOGASTRODUODENOSCOPY (EGD) WITH PROPOFOL;  Surgeon: Iva Boop, MD;  Location: [facility] ENDOSCOPY;  Service: Gastroenterology;  Laterality: N/A;   FEMORAL-POPLITEAL BYPASS GRAFT Left 03/06/2012   Procedure: BYPASS GRAFT FEMORAL-POPLITEAL ARTERY;  Surgeon: Chuck Hint, MD;  Location: [facility] OR;  Service: Vascular;  Laterality: Left;  Left Femoral - Below Knee Popliteal Bypass Graft with Vein and Intraoperative Arteriogram.   IR PARACENTESIS  05/23/2022   JOINT REPLACEMENT Left    left TKA   KNEE ARTHROSCOPY Left    "I had 3" (11/05/2012)   LEAD REVISION  11/05/2012   pacemaker/notes 11/05/2012   LEAD REVISION N/A 11/05/2012   Procedure: LEAD REVISION;  Surgeon: Duke Salvia, MD;  Location[facility] CATH LAB;  Service: Cardiovascular;  Laterality: N/A;   MAZE  06/14/2011   Procedure: MAZE;  Surgeon: Alleen Borne, MD;   Location: [facility] OR;  Service: Open Heart Surgery;  Laterality: N/A;  Ligate left atrial appendage   PERMANENT PACEMAKER INSERTION N/A 06/20/2011   Procedure: PERMANENT PACEMAKER INSERTION;  Surgeon: Duke Salvia, MD;  Location: [facility] CATH LAB;  Service: Cardiovascular;  Laterality: N/A;   POLYPECTOMY  03/18/2021   Procedure: POLYPECTOMY;  Surgeon: Jeani Hawking, MD;  Location: [facility] ENDOSCOPY;  Service: Gastroenterology;;   TONSILLECTOMY AND ADENOIDECTOMY     TOTAL KNEE ARTHROPLASTY Left      reports that he quit smoking about 43 years ago. His smoking use included cigarettes. He started smoking about 75 years ago. He has a 96 pack-year smoking history. He has never used smokeless tobacco. He reports current alcohol use of about 2.0 standard drinks of alcohol per week. He reports that he does not use drugs.  Allergies  Allergen Reactions   Lipitor [Atorvastatin Calcium] Other (See Comments)    Weakness/ pain in legs    Family History  Problem Relation Age of Onset  Coronary artery disease Father    Hypertension Father    Heart disease Father        Heart Disease before age 55   Heart attack Mother    Coronary artery disease Mother    Deep vein thrombosis Mother    Heart disease Mother        Heart Disease before age 57   Hyperlipidemia Mother    Hypertension Mother    Hypertension Sister    Heart disease Sister        Heart Disease before age 92   Hyperlipidemia Sister    Heart disease Brother        Heart Disease before age 69   Colon cancer Brother 23   Anesthesia problems Neg Hx    Diabetes Neg Hx     Prior to Admission medications   Medication Sig Start Date End Date Taking? Authorizing Provider  acetaminophen (TYLENOL) 650 MG CR tablet Take 100 mg by mouth daily.   Yes [provider]  allopurinol (ZYLOPRIM) 100 MG tablet Take 100 mg by mouth daily.   Yes [provider]  carvedilol (COREG) 12.5 MG tablet Take 6.25 mg by mouth 2 (two) times daily with a meal.    Yes [provider]  cholecalciferol (VITAMIN D3) 25 MCG (1000 UNIT) tablet Take 2,000 Units by mouth daily.   Yes [provider]  cyanocobalamin 1000 MCG tablet Take 1,000 mcg by mouth daily.   Yes [provider]  furosemide (LASIX) 20 MG tablet Take 40-60 mg by mouth See admin instructions. Takes 60 mg in the morning and 40 mg at night   Yes [provider]  insulin glargine (LANTUS) 100 UNIT/ML injection Inject 48 Units into the skin in the morning.   Yes [provider]  isosorbide mononitrate (IMDUR) 30 MG 24 hr tablet Take 30 mg by mouth daily.   Yes [provider]  levothyroxine (SYNTHROID) 25 MCG tablet Take 25 mcg by mouth daily before breakfast.   Yes [provider]  magnesium oxide (MAG-OX) 400 (240 Mg) MG tablet Take 2 tablets by mouth daily.   Yes [provider]  Multiple Vitamin (MULTIVITAMIN) capsule Take 1 capsule by mouth daily.   Yes [provider]  pantoprazole (PROTONIX) 40 MG tablet Take 10 mg by mouth daily.   Yes [provider]  Propylene Glycol (SYSTANE BALANCE) 0.6 % SOLN Place 1 drop into both eyes daily as needed.   Yes [provider]  Semaglutide, 1 MG/DOSE, 2 MG/1.5ML SOPN Inject 1 mg into the skin every Sunday.   Yes [provider]  spironolactone (ALDACTONE) 25 MG tablet Take 1 tablet (25 mg total) by mouth daily. 05/26/22  Yes Masters, Katie, DO  testosterone cypionate (DEPOTESTOSTERONE CYPIONATE) 200 MG/ML injection Inject 200 mg into the muscle every 14 (fourteen) days. Given every other Sunday   Yes [provider]  Zinc 50 MG TABS Take 1 tablet (50 mg total) by mouth daily. 03/25/22  Yes [name], MD    Physical Exam: Vitals:   08/24/22 1441 08/24/22 1456 08/24/22 1605 08/24/22 1700  BP: (!) 124/57  120/65 116/61  Pulse: 63  61 65  Resp: 19  18 18   Temp: 97.7 F (36.5 C)     TempSrc: Oral     SpO2: 100%  100% 100%  Weight:  90.7 kg 90 kg    Height: 5\' 10"  (1.778 m) 5\' 10"  (1.778 m)      Physical Exam  Vitals and nursing note reviewed.  Constitutional:      General: He is not in acute distress.    Appearance: He is obese. He is not toxic-appearing or diaphoretic.  HENT:     Head: Normocephalic and atraumatic.     Nose: Nose normal.  Cardiovascular:     Rate and Rhythm: Normal rate and regular rhythm.  Pulmonary:     Effort: Pulmonary effort is normal. No respiratory distress.     Breath sounds: Normal breath sounds.  Abdominal:     General: Abdomen is protuberant. Bowel sounds are normal. There is no distension.     Palpations: Abdomen is soft.     Tenderness: There is no abdominal tenderness.  Musculoskeletal:     Right lower leg: 1+ Pitting Edema present.     Left lower leg: 1+ Pitting Edema present.  Skin:    General: Skin is warm and dry.     Capillary Refill: Capillary refill takes less than 2 seconds.  Neurological:     General: No focal deficit present.     Mental Status: He is alert and oriented to person, place, and time.      Labs on Admission: I have personally reviewed following labs and imaging studies  CBC: Recent Labs  Lab 08/18/22 1422 08/19/22 0746 08/24/22 1543  WBC 9.4 11.4* 9.5  NEUTROABS 7.1  --  6.7  HGB 7.2* 7.8* 7.2*  HCT 25.2* 27.5* 25.0*  MCV 86.6 86.8 85.0  PLT 163 195 230   Basic Metabolic Panel: Recent Labs  Lab 08/18/22 1422 08/19/22 0746 08/24/22 1543 08/24/22 1653  NA 138 136 132*  --   K 4.7 4.2 5.3* 4.7  CL 106 105 99  --   CO2 22 21* 22  --   GLUCOSE 145* 125* 111*  --   BUN 61* 59* 71*  --   CREATININE 3.01* 2.67* 3.69*  --   CALCIUM 8.9 9.0 8.6*  --    GFR: Estimated Creatinine Clearance: 18 mL/min (A) (by C-G formula based on SCr of 3.69 mg/dL (H)). Liver Function Tests: Recent Labs  Lab 08/18/22 1422 08/19/22 0746 08/24/22 1543  AST 20 21 21   ALT 13 15 16   ALKPHOS 133* 126 151*  BILITOT 1.0 1.3* 1.0  PROT 7.5 7.7 7.9   ALBUMIN 3.0* 3.1* 3.1*   Recent Labs  Lab 08/18/22 1422  LIPASE 50   Recent Labs  Lab 08/18/22 1422 08/19/22 0746  AMMONIA 70* 36*   Coagulation Profile: No results for input(s): "INR", "PROTIME" in the last 168 hours. Cardiac Enzymes: No results for input(s): "CKTOTAL", "CKMB", "CKMBINDEX", "TROPONINI", "TROPONINIHS" in the last 168 hours. BNP (last 3 results) Recent Labs    03/20/22 1137 05/25/22 0945 08/18/22 1422  BNP 131.2* 182.2* 187.6*   HbA1C: No results for input(s): "HGBA1C" in the last 72 hours. CBG: Recent Labs  Lab 08/18/22 2112 08/19/22 0718 08/19/22 1112  GLUCAP 201* 110* 205*   Lipid Profile: No results for input(s): "CHOL", "HDL", "LDLCALC", "TRIG", "CHOLHDL", "LDLDIRECT" in the last 72 hours. Thyroid Function Tests: No results for input(s): "TSH", "T4TOTAL", "FREET4", "T3FREE", "THYROIDAB" in the last 72 hours. Anemia Panel: No results for input(s): "VITAMINB12", "FOLATE", "FERRITIN", "TIBC", "IRON", "RETICCTPCT" in the last 72 hours. Urine analysis:    Component Value Date/Time   COLORURINE YELLOW 08/18/2022 1559   APPEARANCEUR CLEAR 08/18/2022 1559   LABSPEC 1.012 08/18/2022 1559   PHURINE 8.0 08/18/2022 1559   GLUCOSEU NEGATIVE 08/18/2022 1559   GLUCOSEU NEGATIVE  07/08/2013 0950   HGBUR NEGATIVE 08/18/2022 1559   BILIRUBINUR NEGATIVE 08/18/2022 1559   BILIRUBINUR neg 12/01/2012 0953   KETONESUR NEGATIVE 08/18/2022 1559   PROTEINUR NEGATIVE 08/18/2022 1559   UROBILINOGEN 0.2 02/18/2017 0928   NITRITE NEGATIVE 08/18/2022 1559   LEUKOCYTESUR NEGATIVE 08/18/2022 1559    Radiological Exams on Admission: I have personally reviewed images No results found.  EKG: My personal interpretation of EKG shows: no EKG to review  Assessment/Plan Principal Problem:   Symptomatic anemia Active Problems:   Acute renal failure superimposed on stage 4 chronic kidney disease (HCC)   PAF (paroxysmal atrial fibrillation) (HCC)   Diabetes mellitus  (HCC)   Hyperlipidemia   Pacemaker-Medtronic   Chronic diastolic CHF (congestive heart failure) (HCC)   CKD (chronic kidney disease) stage 4, GFR 15-29 ml/min (HCC)   Benign essential hypertension   Type 2 diabetes mellitus with hyperglycemia (HCC)   GI bleed    Assessment and Plan: * Symptomatic anemia Observation med/surg bed. Transfuse with 2 units PRBC. May need to give IV lasix after transfusion. Want to avoid if possible due to AKI. But if he develops SOB, will need to give IV lasix.  Acute renal failure superimposed on stage 4 chronic kidney disease (HCC) Likely due to acute on chronic anemia, continued use of lasix/aldactone at home. Hopefully, transfusion will help his renal perfusion. Trying to avoid giving IVF due to hx of anasarca, CKD stage 4 and diastolic CHF.  GI bleed Already had EGD/colonoscopy last year per pt and wife. Getting ready for capsule endoscopy next week by Dr. Rhea Belton.  Type 2 diabetes mellitus with hyperglycemia (HCC) Add SSI. Continue with lantus.  Benign essential hypertension Stable. Continue with imdur and coreg. Hold lasix and aldactone due to AKI.  CKD (chronic kidney disease) stage 4, GFR 15-29 ml/min (HCC) Chronic. Acutely worse due to anemia. Follows with nephrology at the [facility] medical center. Pt states he was told he is not ready for HD yet.  Chronic diastolic CHF (congestive heart failure) (HCC) Holding lasix, aldactone due to AKI. Continue with coreg, imdur.  Pacemaker-Medtronic Chronic.  Hyperlipidemia Stable.  Diabetes mellitus (HCC) Stable. Add SSI. Continue with lantus.  PAF (paroxysmal atrial fibrillation) (HCC) Stable. Has PPM. Off Eliquis this week due to intermittent GI bleeding.   DVT prophylaxis: SCDs Code Status: Full Code Family Communication: discussed with pt and wife Mimi at bedside Disposition Plan: return home  Consults called: none  Admission status: Observation, Med-Surg   Carollee Herter, DO [facility] 08/24/2022, 6:16 PM

## 2022-08-24 NOTE — Assessment & Plan Note (Signed)
Stable. Add SSI. Continue with lantus.

## 2022-08-24 NOTE — Assessment & Plan Note (Signed)
Add SSI. Continue with lantus.

## 2022-08-24 NOTE — Assessment & Plan Note (Signed)
Holding lasix, aldactone due to AKI. Continue with coreg, imdur.

## 2022-08-24 NOTE — Assessment & Plan Note (Signed)
Observation med/surg bed. Transfuse with 2 units PRBC. May need to give IV lasix after transfusion. Want to avoid if possible due to AKI. But if he develops SOB, will need to give IV lasix.

## 2022-08-24 NOTE — Progress Notes (Signed)
There are two orders of blood transfusion for this patient.  Do you want him to get 1 unit PRBC or 2 units PRBC. Let me know. The lab is preparing for only one.

## 2022-08-24 NOTE — Progress Notes (Signed)
Texted on call night NP.  She stated that it should be two unit transfusion.  Relay this message to Blood bank lab.

## 2022-08-24 NOTE — Assessment & Plan Note (Signed)
Chronic. Acutely worse due to anemia. Follows with nephrology at the [facility] medical center. Pt states he was told he is not ready for HD yet.

## 2022-08-24 NOTE — Assessment & Plan Note (Signed)
Already had EGD/colonoscopy last year per pt and wife. Getting ready for capsule endoscopy next week by Dr. Rhea Belton.

## 2022-08-24 NOTE — ED Triage Notes (Signed)
Pt wife reports pt has been weak, noticed blood in stool and was told hgb was low. Pt was advised to come to ED.

## 2022-08-24 NOTE — Assessment & Plan Note (Signed)
Stable. Continue with imdur and coreg. Hold lasix and aldactone due to AKI.

## 2022-08-24 NOTE — Assessment & Plan Note (Signed)
Chronic. 

## 2022-08-24 NOTE — Assessment & Plan Note (Signed)
Stable

## 2022-08-24 NOTE — Subjective & Objective (Signed)
CC: lethargy, weakness HPI: 82 year old male with a history of chronic diastolic heart failure, CKD stage IV, history of paroxysmal atrial fibrillation, recurrent GI bleeding, history of cirrhosis of the liver, chronic anemia, permanent pacemaker, presents to the ER today with worsening fatigue and lethargy.  He had been admitted to the hospital last week for acute kidney injury.  He was given IV fluids.  Diuretics were held.  He is also given a unit of blood.  Patient's had intermittent blood in his stools.  He is undergoing workup by GI.  He is ready had a colonoscopy and upper endoscopy last year.  He is scheduled for a capsule endoscopy this coming week.  He has been having increasing fatigue at home.  No fever.  No diarrhea.  On arrival temp 97.7 heart rate 63 blood pressure 124/57 satting high percent on room air.  Sodium 132, potassium 5.3, BUN of 71, creatinine 3.69, glucose 111  Calcium 8.6, Total protein 7.9, albumin 3.1  Alk phos 151  White count 9.5, hemoglobin 7.2, platelets of 230  When he was just discharged from the hospital last week, his discharge hemoglobin was 7.8.  BUN and creatinine at the time of discharge was 59/2.67.  Due to the patient's symptomatic anemia, [facility] hospitalist contacted for admission.

## 2022-08-25 DIAGNOSIS — D649 Anemia, unspecified: Secondary | ICD-10-CM | POA: Diagnosis not present

## 2022-08-25 DIAGNOSIS — E785 Hyperlipidemia, unspecified: Secondary | ICD-10-CM

## 2022-08-25 DIAGNOSIS — R188 Other ascites: Secondary | ICD-10-CM

## 2022-08-25 DIAGNOSIS — E039 Hypothyroidism, unspecified: Secondary | ICD-10-CM | POA: Diagnosis present

## 2022-08-25 DIAGNOSIS — E1165 Type 2 diabetes mellitus with hyperglycemia: Secondary | ICD-10-CM

## 2022-08-25 DIAGNOSIS — I5032 Chronic diastolic (congestive) heart failure: Secondary | ICD-10-CM | POA: Diagnosis present

## 2022-08-25 DIAGNOSIS — I2510 Atherosclerotic heart disease of native coronary artery without angina pectoris: Secondary | ICD-10-CM | POA: Diagnosis present

## 2022-08-25 DIAGNOSIS — Z950 Presence of cardiac pacemaker: Secondary | ICD-10-CM

## 2022-08-25 DIAGNOSIS — K2289 Other specified disease of esophagus: Secondary | ICD-10-CM | POA: Diagnosis not present

## 2022-08-25 DIAGNOSIS — E872 Acidosis, unspecified: Secondary | ICD-10-CM | POA: Diagnosis present

## 2022-08-25 DIAGNOSIS — E8809 Other disorders of plasma-protein metabolism, not elsewhere classified: Secondary | ICD-10-CM | POA: Diagnosis present

## 2022-08-25 DIAGNOSIS — I272 Pulmonary hypertension, unspecified: Secondary | ICD-10-CM | POA: Diagnosis present

## 2022-08-25 DIAGNOSIS — K7460 Unspecified cirrhosis of liver: Secondary | ICD-10-CM | POA: Diagnosis present

## 2022-08-25 DIAGNOSIS — Z794 Long term (current) use of insulin: Secondary | ICD-10-CM | POA: Diagnosis not present

## 2022-08-25 DIAGNOSIS — D509 Iron deficiency anemia, unspecified: Secondary | ICD-10-CM | POA: Diagnosis not present

## 2022-08-25 DIAGNOSIS — K766 Portal hypertension: Secondary | ICD-10-CM | POA: Diagnosis present

## 2022-08-25 DIAGNOSIS — I480 Paroxysmal atrial fibrillation: Secondary | ICD-10-CM

## 2022-08-25 DIAGNOSIS — J449 Chronic obstructive pulmonary disease, unspecified: Secondary | ICD-10-CM | POA: Diagnosis present

## 2022-08-25 DIAGNOSIS — D62 Acute posthemorrhagic anemia: Secondary | ICD-10-CM | POA: Diagnosis present

## 2022-08-25 DIAGNOSIS — N184 Chronic kidney disease, stage 4 (severe): Secondary | ICD-10-CM

## 2022-08-25 DIAGNOSIS — K31811 Angiodysplasia of stomach and duodenum with bleeding: Secondary | ICD-10-CM | POA: Diagnosis present

## 2022-08-25 DIAGNOSIS — E871 Hypo-osmolality and hyponatremia: Secondary | ICD-10-CM | POA: Diagnosis present

## 2022-08-25 DIAGNOSIS — I8510 Secondary esophageal varices without bleeding: Secondary | ICD-10-CM | POA: Diagnosis present

## 2022-08-25 DIAGNOSIS — I10 Essential (primary) hypertension: Secondary | ICD-10-CM | POA: Diagnosis not present

## 2022-08-25 DIAGNOSIS — E1122 Type 2 diabetes mellitus with diabetic chronic kidney disease: Secondary | ICD-10-CM | POA: Diagnosis present

## 2022-08-25 DIAGNOSIS — E1151 Type 2 diabetes mellitus with diabetic peripheral angiopathy without gangrene: Secondary | ICD-10-CM | POA: Diagnosis present

## 2022-08-25 DIAGNOSIS — E1140 Type 2 diabetes mellitus with diabetic neuropathy, unspecified: Secondary | ICD-10-CM | POA: Diagnosis present

## 2022-08-25 DIAGNOSIS — K3189 Other diseases of stomach and duodenum: Secondary | ICD-10-CM | POA: Diagnosis not present

## 2022-08-25 DIAGNOSIS — K922 Gastrointestinal hemorrhage, unspecified: Secondary | ICD-10-CM | POA: Diagnosis present

## 2022-08-25 DIAGNOSIS — N179 Acute kidney failure, unspecified: Secondary | ICD-10-CM

## 2022-08-25 DIAGNOSIS — R195 Other fecal abnormalities: Secondary | ICD-10-CM

## 2022-08-25 DIAGNOSIS — I130 Hypertensive heart and chronic kidney disease with heart failure and stage 1 through stage 4 chronic kidney disease, or unspecified chronic kidney disease: Secondary | ICD-10-CM | POA: Diagnosis present

## 2022-08-25 DIAGNOSIS — Z87891 Personal history of nicotine dependence: Secondary | ICD-10-CM | POA: Diagnosis not present

## 2022-08-25 DIAGNOSIS — E119 Type 2 diabetes mellitus without complications: Secondary | ICD-10-CM

## 2022-08-25 DIAGNOSIS — K449 Diaphragmatic hernia without obstruction or gangrene: Secondary | ICD-10-CM | POA: Diagnosis not present

## 2022-08-25 LAB — COMPREHENSIVE METABOLIC PANEL
ALT: 16 U/L (ref 0–44)
AST: 23 U/L (ref 15–41)
Albumin: 3 g/dL — ABNORMAL LOW (ref 3.5–5.0)
Alkaline Phosphatase: 137 U/L — ABNORMAL HIGH (ref 38–126)
Anion gap: 12 (ref 5–15)
BUN: 71 mg/dL — ABNORMAL HIGH (ref 8–23)
CO2: 19 mmol/L — ABNORMAL LOW (ref 22–32)
Calcium: 8.1 mg/dL — ABNORMAL LOW (ref 8.9–10.3)
Chloride: 100 mmol/L (ref 98–111)
Creatinine, Ser: 3.75 mg/dL — ABNORMAL HIGH (ref 0.61–1.24)
GFR, Estimated: 16 mL/min — ABNORMAL LOW (ref >60)
Glucose, Bld: 159 mg/dL — ABNORMAL HIGH (ref 70–99)
Potassium: 4.6 mmol/L (ref 3.5–5.1)
Sodium: 131 mmol/L — ABNORMAL LOW (ref 135–145)
Total Bilirubin: 1.4 mg/dL — ABNORMAL HIGH (ref 0.3–1.2)
Total Protein: 7.2 g/dL (ref 6.5–8.1)

## 2022-08-25 LAB — GLUCOSE, CAPILLARY
Glucose-Capillary: 118 mg/dL — ABNORMAL HIGH (ref 70–99)
Glucose-Capillary: 164 mg/dL — ABNORMAL HIGH (ref 70–99)
Glucose-Capillary: 166 mg/dL — ABNORMAL HIGH (ref 70–99)
Glucose-Capillary: 132 mg/dL — ABNORMAL HIGH (ref 70–99)

## 2022-08-25 LAB — CBC WITH DIFFERENTIAL/PLATELET
Abs Immature Granulocytes: 0.06 10*3/uL (ref 0.00–0.07)
Basophils Absolute: 0.1 10*3/uL (ref 0.0–0.1)
Basophils Relative: 1 %
Eosinophils Absolute: 0.5 10*3/uL (ref 0.0–0.5)
Eosinophils Relative: 5 %
HCT: 31.9 % — ABNORMAL LOW (ref 39.0–52.0)
Hemoglobin: 9.4 g/dL — ABNORMAL LOW (ref 13.0–17.0)
Immature Granulocytes: 1 %
Lymphocytes Relative: 8 %
Lymphs Abs: 0.7 10*3/uL (ref 0.7–4.0)
MCH: 25.2 pg — ABNORMAL LOW (ref 26.0–34.0)
MCHC: 29.5 g/dL — ABNORMAL LOW (ref 30.0–36.0)
MCV: 85.5 fL (ref 80.0–100.0)
Monocytes Absolute: 1.2 10*3/uL — ABNORMAL HIGH (ref 0.1–1.0)
Monocytes Relative: 14 %
Neutro Abs: 6.1 10*3/uL (ref 1.7–7.7)
Neutrophils Relative %: 71 %
Platelets: 243 10*3/uL (ref 150–400)
RBC: 3.73 MIL/uL — ABNORMAL LOW (ref 4.22–5.81)
RDW: 18.5 % — ABNORMAL HIGH (ref 11.5–15.5)
WBC: 8.6 10*3/uL (ref 4.0–10.5)
nRBC: 0 % (ref 0.0–0.2)

## 2022-08-25 LAB — PHOSPHORUS: Phosphorus: 4.9 mg/dL — ABNORMAL HIGH (ref 2.5–4.6)

## 2022-08-25 LAB — MAGNESIUM: Magnesium: 2.2 mg/dL (ref 1.7–2.4)

## 2022-08-25 MED ORDER — SODIUM CHLORIDE 0.9 % IV SOLN: INTRAVENOUS | Status: DC

## 2022-08-25 MED ORDER — POLYETHYLENE GLYCOL 3350 17 G PO PACK
17.0000 g | PACK | Freq: Once | ORAL | Status: AC
  Administered 2022-08-25: 17 g via ORAL
  Filled 2022-08-25: qty 1

## 2022-08-25 MED ORDER — BISACODYL 5 MG PO TBEC
10.0000 mg | DELAYED_RELEASE_TABLET | Freq: Once | ORAL | Status: AC
  Administered 2022-08-25: 10 mg via ORAL
  Filled 2022-08-25: qty 2

## 2022-08-25 MED ORDER — FUROSEMIDE 40 MG PO TABS
40.0000 mg | ORAL_TABLET | Freq: Every evening | ORAL | Status: DC
  Administered 2022-08-25 – 2022-08-27 (×3): 40 mg via ORAL
  Filled 2022-08-25 (×3): qty 1

## 2022-08-25 MED ORDER — BISACODYL 5 MG PO TBEC
10.0000 mg | DELAYED_RELEASE_TABLET | Freq: Once | ORAL | Status: AC
  Administered 2022-08-26: 10 mg via ORAL
  Filled 2022-08-25: qty 2

## 2022-08-25 MED ORDER — FUROSEMIDE 40 MG PO TABS
60.0000 mg | ORAL_TABLET | Freq: Every day | ORAL | Status: DC
  Administered 2022-08-25 – 2022-08-28 (×3): 60 mg via ORAL
  Filled 2022-08-25: qty 1
  Filled 2022-08-25: qty 2
  Filled 2022-08-25: qty 1

## 2022-08-25 NOTE — H&P (View-Only) (Signed)
Gastroenterology Inpatient Consultation   Attending Requesting Consult Marguerita Merles Mabscott, [facility] Day Hospital Day: 2  Reason for Consult Acute on chronic anemia (heme positive)    History of Present Illness  Edward [name] is a 82 y.o. male with a pmh significant for CHF, CAD, A-fib (prior MAZE and on Eliquis - recently held), PVD, diabetes, hypertension, hyperlipidemia, COPD, bladder cancer, cirrhosis (complicated by portal hypertension manifested as ascites), colon polyps.  The GI service is consulted for evaluation and management of acute on chronic anemia (heme positive).  This patient was just here less than a week ago with heme positive acute on chronic anemia.  He was offered consideration of an endoscopic evaluation but wanted to leave the hospital.  As an outpatient he was urgently arrange for a video capsule endoscopy to be performed this Monday.  He [facility] blood counts [facility] and told to come to the emergency room as he was very low.  He was found to have a hemoglobin of 7.2.  He was transfused 2 units of blood and has had an appropriate bump of his hemoglobin to 9.4.  He denies any hematochezia or maroon stools.  He denies any progressive abdominal pain or discomfort.  He denies coffee-ground emesis or hematemesis.  He is not taking significant NSAIDs or BC/Goody powders.  He has been off of his Eliquis for 1 week.  He denies any hematuria from his previous bladder cancer history.  GI Review of Systems Positive as above Negative for pyrosis, dysphagia, jaundice, change in bowel habits   Review of Systems  General: Denies fevers/chills/weight loss unintentionally Cardiovascular: Denies chest pain Respiratory: Shortness of breath improved after blood transfusion Gastroenterological: See HPI Genitourinary: Denies darkened urine or hematuria Hematological: Positive for easy bruising Dermatological: Denies jaundice Psychological: Mood is  stable   Histories  Past Medical History Past Medical History:  Diagnosis Date   Acute on chronic diastolic heart failure (HCC) 03/20/2022   Anemia    ?   Carotid artery occlusion    Chronic diastolic CHF (congestive heart failure) (HCC) 04/23/2012   CKD (chronic kidney disease), stage III (HCC)    Claudication (HCC)    Coronary artery disease    s/p CABG 2013   Diabetic neuropathy (HCC)    mostly feet/ legs   Diverticulosis    Dyslipidemia    Exertional shortness of breath    Fracture of one rib, left side, initial encounter for closed fracture 02/01/2021   Gastritis and gastroduodenitis    GERD (gastroesophageal reflux disease)    GI bleed    while on triple anticoag therapy   HTN (hypertension)    Pacemaker    PAF (paroxysmal atrial fibrillation) (HCC)    s/p MAZE at time of CABG   PVD (peripheral vascular disease) (HCC)    R CEA 2011, prior PTA, left fem-pop 2014   Stroke (HCC)    vs TIA   Tubular adenoma of colon    Type II diabetes mellitus (HCC)    Past Surgical History:  Procedure Laterality Date   ABDOMINAL AORTAGRAM N/A 01/07/2012   Procedure: ABDOMINAL Ronny Flurry;  Surgeon: Chuck Hint, MD;  Location: [facility] CATH LAB;  Service: Cardiovascular;  Laterality: N/A;   BALLOON DILATION N/A 02/17/2013   Procedure: BALLOON DILATION;  Surgeon: Beverley Fiedler, MD;  Location: [facility] ENDOSCOPY;  Service: Gastroenterology;  Laterality: N/A;   BIOPSY  03/17/2021   Procedure: BIOPSY;  Surgeon: Iva Boop, MD;  Location: [facility] ENDOSCOPY;  Service: Gastroenterology;;   CAROTID ENDARTERECTOMY Right 03/2009   CATARACT EXTRACTION Bilateral    CHOLECYSTECTOMY     CIRCUMCISION     COLONOSCOPY WITH PROPOFOL N/A 02/17/2013   Procedure: COLONOSCOPY WITH PROPOFOL;  Surgeon: Beverley Fiedler, MD;  Location: [facility] ENDOSCOPY;  Service: Gastroenterology;  Laterality: N/A;   COLONOSCOPY WITH PROPOFOL N/A 03/18/2021   Procedure: COLONOSCOPY WITH PROPOFOL;  Surgeon: Jeani Hawking, MD;  Location: [facility]  ENDOSCOPY;  Service: Gastroenterology;  Laterality: N/A;   CORONARY ARTERY BYPASS GRAFT  06/14/2011   Procedure: CORONARY ARTERY BYPASS GRAFTING (CABG);  Surgeon: Alleen Borne, MD;  Location: [facility] OR;  Service: Open Heart Surgery;  Laterality: N/A;  Coronary Artery Bypass Graft on pump times six;  utilizing internal mammary artery and right greater saphenous vein harvested endoscopically.x 5 vessels   ELECTROPHYSIOLOGY STUDY N/A 11/05/2012   Procedure: ELECTROPHYSIOLOGY STUDY;  Surgeon: Duke Salvia, MD;  Location: [facility] CATH LAB;  Service: Cardiovascular;  Laterality: N/A;   ESOPHAGOGASTRODUODENOSCOPY (EGD) WITH PROPOFOL N/A 02/17/2013   Procedure: ESOPHAGOGASTRODUODENOSCOPY (EGD) WITH PROPOFOL;  Surgeon: Beverley Fiedler, MD;  Location: [facility] ENDOSCOPY;  Service: Gastroenterology;  Laterality: N/A;   ESOPHAGOGASTRODUODENOSCOPY (EGD) WITH PROPOFOL N/A 03/17/2021   Procedure: ESOPHAGOGASTRODUODENOSCOPY (EGD) WITH PROPOFOL;  Surgeon: Iva Boop, MD;  Location: [facility] ENDOSCOPY;  Service: Gastroenterology;  Laterality: N/A;   FEMORAL-POPLITEAL BYPASS GRAFT Left 03/06/2012   Procedure: BYPASS GRAFT FEMORAL-POPLITEAL ARTERY;  Surgeon: Chuck Hint, MD;  Location: [facility] OR;  Service: Vascular;  Laterality: Left;  Left Femoral - Below Knee Popliteal Bypass Graft with Vein and Intraoperative Arteriogram.   IR PARACENTESIS  05/23/2022   JOINT REPLACEMENT Left    left TKA   KNEE ARTHROSCOPY Left    "I had 3" (11/05/2012)   LEAD REVISION  11/05/2012   pacemaker/notes 11/05/2012   LEAD REVISION N/A 11/05/2012   Procedure: LEAD REVISION;  Surgeon: Duke Salvia, MD;  Location: [facility] CATH LAB;  Service: Cardiovascular;  Laterality: N/A;   MAZE  06/14/2011   Procedure: MAZE;  Surgeon: Alleen Borne, MD;  Location: [facility] OR;  Service: Open Heart Surgery;  Laterality: N/A;  Ligate left atrial appendage   PERMANENT PACEMAKER INSERTION N/A 06/20/2011   Procedure: PERMANENT PACEMAKER INSERTION;  Surgeon: Duke Salvia, MD;   Location: [facility] CATH LAB;  Service: Cardiovascular;  Laterality: N/A;   POLYPECTOMY  03/18/2021   Procedure: POLYPECTOMY;  Surgeon: Jeani Hawking, MD;  Location: [facility] ENDOSCOPY;  Service: Gastroenterology;;   TONSILLECTOMY AND ADENOIDECTOMY     TOTAL KNEE ARTHROPLASTY Left     Allergies Allergies  Allergen Reactions   Lipitor [Atorvastatin Calcium] Other (See Comments)    Weakness/ pain in legs    Family History Family History  Problem Relation Age of Onset   Coronary artery disease Father    Hypertension Father    Heart disease Father        Heart Disease before age 11   Heart attack Mother    Coronary artery disease Mother    Deep vein thrombosis Mother    Heart disease Mother        Heart Disease before age 73   Hyperlipidemia Mother    Hypertension Mother    Hypertension Sister    Heart disease Sister        Heart Disease before age 46   Hyperlipidemia Sister    Heart disease Brother        Heart Disease before age 50   Colon  cancer Brother 61   Anesthesia problems Neg Hx    Diabetes Neg Hx    The patient's FH is negative for IBD/IBS/Liver Disease/GI Malignancies.  Social History Social History   Socioeconomic History   Marital status: Married    Spouse name: Not on file   Number of children: Not on file   Years of education: Not on file   Highest education level: Not on file  Occupational History   Occupation: retired  Tobacco Use   Smoking status: Former    Current packs/day: 0.00    Average packs/day: 3.0 packs/day for 32.0 years (96.0 ttl pk-yrs)    Types: Cigarettes    Start date: 01/16/1947    Quit date: 01/16/1979    Years since quitting: 43.6   Smokeless tobacco: Never  Vaping Use   Vaping status: Never Used  Substance and Sexual Activity   Alcohol use: Yes    Alcohol/week: 2.0 standard drinks of alcohol    Types: 2 Glasses of wine per week    Comment: 1 glass of champagne with dinner, occasional beer   Drug use: No   Sexual activity: Not Currently   Other Topics Concern   Not on file  Social History Narrative   Not on file   Social Determinants of Health   Financial Resource Strain: Not on file  Food Insecurity: No Food Insecurity (08/25/2022)   Hunger Vital Sign    Worried About Running Out of Food in the Last Year: Never true    Ran Out of Food in the Last Year: Never true  Transportation Needs: No Transportation Needs (08/25/2022)   PRAPARE - Administrator, Civil Service (Medical): No    Lack of Transportation (Non-Medical): No  Physical Activity: Not on file  Stress: Not on file  Social Connections: Unknown (05/30/2021)   Received from [facility]   Social Network    Social Network: Not on file  Intimate Partner Violence: Not At Risk (08/25/2022)   Humiliation, Afraid, Rape, and Kick questionnaire    Fear of Current or Ex-Partner: No    Emotionally Abused: No    Physically Abused: No    Sexually Abused: No   The patient denies Drug, Alcohol, or Tobacco use.   Occupation is Travel   Medications  Home Medications No current facility-administered medications on file prior to encounter.   Current Outpatient Medications on File Prior to Encounter  Medication Sig Dispense Refill   acetaminophen (TYLENOL) 650 MG CR tablet Take 100 mg by mouth daily.     allopurinol (ZYLOPRIM) 100 MG tablet Take 100 mg by mouth daily.     carvedilol (COREG) 12.5 MG tablet Take 6.25 mg by mouth 2 (two) times daily with a meal.     cholecalciferol (VITAMIN D3) 25 MCG (1000 UNIT) tablet Take 2,000 Units by mouth daily.     cyanocobalamin 1000 MCG tablet Take 1,000 mcg by mouth daily.     furosemide (LASIX) 20 MG tablet Take 40-60 mg by mouth See admin instructions. Takes 60 mg in the morning and 40 mg at night     insulin glargine (LANTUS) 100 UNIT/ML injection Inject 48 Units into the skin in the morning.     isosorbide mononitrate (IMDUR) 30 MG 24 hr tablet Take 30 mg by mouth daily.     levothyroxine  (SYNTHROID) 25 MCG tablet Take 25 mcg by mouth daily before breakfast.     magnesium oxide (MAG-OX) 400 (240 Mg) MG tablet Take  2 tablets by mouth daily.     Multiple Vitamin (MULTIVITAMIN) capsule Take 1 capsule by mouth daily.     pantoprazole (PROTONIX) 40 MG tablet Take 10 mg by mouth daily.     Propylene Glycol (SYSTANE BALANCE) 0.6 % SOLN Place 1 drop into both eyes daily as needed.     Semaglutide, 1 MG/DOSE, 2 MG/1.5ML SOPN Inject 1 mg into the skin every Sunday.     spironolactone (ALDACTONE) 25 MG tablet Take 1 tablet (25 mg total) by mouth daily. 30 tablet 0   testosterone cypionate (DEPOTESTOSTERONE CYPIONATE) 200 MG/ML injection Inject 200 mg into the muscle every 14 (fourteen) days. Given every other Sunday     Zinc 50 MG TABS Take 1 tablet (50 mg total) by mouth daily. 30 tablet 0   Scheduled Inpatient Medications  allopurinol  100 mg Oral Daily   bisacodyl  10 mg Oral Once   [START ON 08/26/2022] bisacodyl  10 mg Oral Once   carvedilol  6.25 mg Oral BID   furosemide  40 mg Oral QPM   furosemide  60 mg Oral Q breakfast   insulin aspart  0-5 Units Subcutaneous QHS   insulin aspart  0-9 Units Subcutaneous TID WC   insulin glargine-yfgn  40 Units Subcutaneous Daily   isosorbide mononitrate  30 mg Oral Daily   levothyroxine  25 mcg Oral Q0600   pantoprazole  40 mg Oral Daily   polyethylene glycol  17 g Oral Once   Continuous Inpatient Infusions  PRN Inpatient Medications acetaminophen **OR** acetaminophen, melatonin, ondansetron **OR** ondansetron (ZOFRAN) IV   Physical Examination  BP 116/62 (BP Location: Right Arm)   Pulse 60   Temp 97.7 F (36.5 C)   Resp 17   Ht 5\' 10"  (1.778 m)   Wt 90 kg   SpO2 100%   BMI 28.47 kg/m  GEN: NAD, appears stated age, doesn't appear chronically ill PSYCH: Cooperative, without pressured speech EYE: Conjunctivae pink, sclerae anicteric ENT: MMM CV: Nontachycardic RESP: Decreased breath sounds at bases without audible  wheezing GI: NABS, protuberant abdomen, rounded soft, NT/ND, without rebound or guarding, no HSM appreciated GU: DRE shows evidence of skin tags, external hemorrhoids, palpation of internal hemorrhoids, and brown stool with dark black flecks MSK/EXT: Trace bilateral pedal edema SKIN: No jaundice, no spider angiomata, no concerning rashes NEURO:  Alert & Oriented x 3, no focal deficits, no evidence of asterixis   Review of Data  I reviewed the following data at the time of this encounter:  Laboratory Studies   Recent Labs  Lab 08/25/22 0838  NA 131*  K 4.6  CL 100  CO2 19*  BUN 71*  CREATININE 3.75*  GLUCOSE 159*  CALCIUM 8.1*  MG 2.2  PHOS 4.9*   Recent Labs  Lab 08/25/22 0838  AST 23  ALT 16  ALKPHOS 137*    Recent Labs  Lab 08/19/22 0746 08/24/22 1543 08/25/22 0838  WBC 11.4* 9.5 8.6  HGB 7.8* 7.2* 9.4*  HCT 27.5* 25.0* 31.9*  PLT 195 230 243   No results for input(s): "APTT", "INR" in the last 168 hours. MELD 3.0: 24 at 05/26/2022  1:12 AM MELD-Na: 24 at 05/26/2022  1:12 AM Calculated from: Serum Creatinine: 2.82 mg/dL at 5/40/9811  9:14 AM Serum Sodium: 136 mmol/L at 05/26/2022  1:12 AM Total Bilirubin: 1.4 mg/dL at 7/82/9562  1:30 AM Serum Albumin: 2.6 g/dL at 8/65/7846  9:62 AM INR(ratio): 1.6 at 05/25/2022 10:51 AM Age at listing (hypothetical): 39  years Sex: Male at 05/26/2022  1:12 AM  Imaging Studies  March 2024 abdominal ultrasound IMPRESSION: Small ascites. Nodularity in liver surface suggests cirrhosis. Splenomegaly. Status post cholecystectomy. No sonographic abnormalities are seen in the kidneys.  GI Procedures and Studies  EGD March 2023 showed gastritis with biopsies showing slight chronic inflammation and reactive changes, no H. pylori.   Colonoscopy March 2023 showed 2 polyps and diverticulosis.  These were tubular adenomas on pathology.   Assessment  Edward [name] is a 82 y.o. male with a pmh significant for CHF, CAD, A-fib (prior  MAZE and on Eliquis - recently held), PVD, diabetes, hypertension, hyperlipidemia, COPD, bladder cancer, cirrhosis (complicated by portal hypertension manifested as ascites), colon polyps.  The patient is hemodynamically stable at this time.  He has dropped his hemoglobin by 1 g, basically the amount of blood he received last week.  He seems to have stabilized his blood count currently.  Based on his chronic renal insufficiency, I suspect he has angiectasias that are probably the etiology for his symptoms.  As there are some black flecks, which may be iron related, I do think it is reasonable to repeat an upper endoscopy to just ensure that he has not had any progression of his liver disease/cirrhosis and varices.  I will plan to proceed with this upper endoscopy tomorrow.  I will plan to proceed with a video capsule endoscopy unless I find an overt cause for iron deficiency on his upper endoscopic evaluation.  The risks and benefits of endoscopic evaluation were discussed with the patient; these include but are not limited to the risk of perforation, infection, bleeding, missed lesions, lack of diagnosis, severe illness requiring hospitalization, as well as anesthesia and sedation related illnesses.  The patient and/or family is agreeable to proceed.   Plan/Recommendations  N.p.o. at midnight EGD +/- video capsule endoscopy on 8/11 Dulcolax 10 mg x 1 dose today MiraLAX 17 g x 1 dose today Dulcolax 10 mg x 1 dose tomorrow Recommend IV iron infusions while in-house If patient needs anticoagulation sooner, would consider heparin drip rather than DOAC initiation for now   Thank you for this consult.  We will continue to follow.  Please page/call with questions or concerns.   Corliss Parish, MD [facility] Advanced Endoscopy Office # [phone]

## 2022-08-25 NOTE — Anesthesia Preprocedure Evaluation (Signed)
Anesthesia Evaluation  Patient identified by MRN, date of birth, ID band Patient awake    Reviewed: Allergy & Precautions, NPO status , Patient's Chart, lab work & pertinent test results  Airway Mallampati: II  TM Distance: >3 FB Neck ROM: Full    Dental no notable dental hx. (+) Dental Advisory Given, Teeth Intact   Pulmonary shortness of breath, former smoker   Pulmonary exam normal breath sounds clear to auscultation       Cardiovascular hypertension, Pt. on medications and Pt. on home beta blockers pulmonary hypertension+ CAD, + Cardiac Stents, + CABG (2013), + Peripheral Vascular Disease and +CHF  Normal cardiovascular exam+ dysrhythmias (on Eliquis) Atrial Fibrillation + pacemaker + Valvular Problems/Murmurs  Rhythm:Regular Rate:Normal  Echo 03/2022  1. Left ventricular ejection fraction, by estimation, is 55 to 60%. The left ventricle has normal function. The left ventricle has no regional wall motion abnormalities. There is moderate concentric left ventricular hypertrophy. Left ventricular diastolic parameters are indeterminate.   2. Right ventricular systolic function is moderately reduced. The right ventricular size is normal. There is mildly elevated pulmonary artery systolic pressure. The estimated right ventricular systolic pressure is 39.4 mmHg.   3. Left atrial size was mildly dilated.   4. Right atrial size was mildly dilated.   5. The mitral valve is normal in structure. Trivial mitral valve regurgitation. No evidence of mitral stenosis. Moderate mitral annular calcification.   6. The aortic valve is tricuspid. There is moderate calcification of the aortic valve. Aortic valve regurgitation is not visualized. Aortic valve sclerosis/calcification is present, without any evidence of aortic stenosis.   7. The inferior vena cava is dilated in size with <50% respiratory variability, suggesting right atrial pressure of 15 mmHg.    8. The patient appeared to be in atrial fibrillation.    Cath 01/2021 This result has an attachment that is not available.   Successful PCI to the SVG-OM graft   Multi-vessel coronary artery disease.   Planned PCI of SVG. Dye sparing, biplane.   Femoral micropuncture US guided. Good stick. 82F long  Heparin/plavix   AL1 guide to SVG-OM. BMW advanced to native OM.  Spyder distal embolic protection device attempted to advance, unable to  cross lesion.  PTA with 3.0x20 compliant balloon with good expansion.  Advanced spider 4.0 to mid distal SVG prior to distal anastamosis.  Stented with 3.0x32 Synergy DES, post dilated with 3.5NC at high  pressures.   0% residual, TIMI III flow. Spyder removed without issue.  Manual pressure femoral site - EBL <2mL. No specimens.  Coronary Findings Diagnostic Dominance: Right  Vein Graft To 1st Mrg: Origin to Prox Graft lesion is 80% stenosed. Lesion length: 28 mm. TIMI flow is 3.     Neuro/Psych CVA  negative psych ROS   GI/Hepatic Neg liver ROS,GERD  Medicated,,  Endo/Other  diabetes, Type 2, Insulin Dependent    Renal/GU CRFRenal disease     Musculoskeletal  (+) Arthritis , Osteoarthritis,    Abdominal   Peds  Hematology  (+) Blood dyscrasia, anemia   Anesthesia Other Findings   Reproductive/Obstetrics                              Anesthesia Physical Anesthesia Plan  ASA: 4  Anesthesia Plan: MAC   Post-op Pain Management: Minimal or no pain anticipated   Induction: Intravenous  PONV Risk Score and Plan: 1 and Propofol infusion, Treatment may vary due to  age or medical condition and TIVA  Airway Management Planned: Natural Airway  Additional Equipment: None  Intra-op Plan:   Post-operative Plan:   Informed Consent: I have reviewed the patients History and Physical, chart, labs and discussed the procedure including the risks, benefits and alternatives for the proposed anesthesia with the  patient or authorized representative who has indicated his/her understanding and acceptance.     Dental advisory given  Plan Discussed with: CRNA  Anesthesia Plan Comments:         Anesthesia Quick Evaluation

## 2022-08-25 NOTE — Progress Notes (Signed)
PROGRESS NOTE    Edward [name]  VVO:[id number] DOB: 10/14/1940 DOA: 08/24/2022 PCP: Byrd Hesselbach, PA   Brief Narrative:  The patient is an 82 year old Caucasian male with a past medical history significant for volume to chronic diastolic CHF, chronic kidney disease stage IV, history of proximal atrial fibrillation on anticoagulation which was recently held, history of recurrent GI bleeding, history of cirrhosis of the liver, history of chronic anemia, history of permanent pacemaker as well as other comorbidities who presented to the ED yesterday for worsening fatigue and lethargy.  He was recently admitted to the hospital last week for an AKI was given IV fluids and his diuretics were held and he was also given a unit of blood.  He had intermittent blood in his stools and he was currently undergoing outpatient GI workup and had a colonoscopy and upper endoscopy last year.  He is scheduled for a capsule endoscopy on Monday but he continued have increasing fatigue at home and wife noticed that his blood count had dropped on labs so she brought him into the ED for further evaluation.  He was typed and screened and transfused 2 units of PRBCs.  Renal function is started to worsen so IV fluid hydration has now held given that he also received blood and will start him back on diuresis.  GI was consulted for further evaluation and they are planning EGD with video capsule endoscopy to be placed.  Assessment and Plan:    GI Bleed and Acute on chronic Normocytic Anemia -Already had EGD/colonoscopy last year per pt and wife.  -Getting ready for capsule endoscopy next week by Dr. Rhea Belton but I consulted GI Inpatient and they evaluated and planning for a EGD with video capsule endoscopy to be placed as well in the a.m. and also start him on mild bowel regimen with bisacodyl 10 mg -Hgb/Hct Trend: Recent Labs  Lab 08/18/22 1422 08/19/22 0746 08/24/22 1543 08/25/22 0838  HGB 7.2* 7.8* 7.2* 9.4*  HCT 25.2*  27.5* 25.0* 31.9*  MCV 86.6 86.8 85.0 85.5  -Continue to Monitor for S/Sx of Bleeding;  -Repeat CBC in the AM   Benign Essential Hypertension -Stable.  -Continue with Imdur and Coreg.  -Initially held Lasix and Aldactone due to AKI but now just resumed the Lasix and continuing to hold the Aldactone -Continue monitor blood pressures per protocol -Last blood pressure reading was   AKI on CKD (chronic kidney disease) stage 4, GFR 15-29 ml/min (HCC) Metabolic Acidosis -Acutely worse due to anemia and continued use of Lasix/Aldacone at home -Stopped IVF due to Hx of Anasarca and Diastolic CHF -Follows with nephrology at the [facility].  -Pt states he was told he is not ready for HD yet. -Patient has a slight metabolic acidosis with a CO2 of 19, anion gap of 12, Chloride Level of 100 -BUN/Cr Trend: Recent Labs  Lab 08/18/22 1422 08/19/22 0746 08/24/22 1543 08/25/22 0838  BUN 61* 59* 71* 71*  CREATININE 3.01* 2.67* 3.69* 3.75*  -Given IVF and Blood but appears Volume overloaded a little so will resume Home Diuretics  -Avoid Nephrotoxic Medications, Contrast Dyes, Hypotension and Dehydration to Ensure Adequate Renal Perfusion and will need to Renally Adjust Meds -Continue to Monitor and Trend Renal Function carefully and repeat CMP in the AM   Chronic Diastolic CHF (congestive heart failure) (HCC) Was Holding lasix, aldactone due to AKI but renal function worsened after and he is a little volume overloaded.  Now will resume his home Lasix of  60 mg in the a.m. and 40 mg in the evening -Continue with coreg, imdur. -Strict I's and O's and Daily Weights  Intake/Output Summary (Last 24 hours) at 08/25/2022 1611 Last data filed at 08/25/2022 0441 Gross per 24 hour  Intake 1303 ml  Output 300 ml  Net 1003 ml  -Continue to Monitor for S/Sx of Volume Overload -Obtain CXR in the AM   Hyponatremia -Na+ Trend Recent Labs  Lab 08/18/22 1422 08/19/22 0746 08/24/22 1543  08/25/22 0838  NA 138 136 132* 131*  -Resume Lasix and continue to Monitor  -Continue monitor and trend and repeat CMP in a.m.  Pacemaker-Medtronic -Chronic.   Hyperlipidemia -Stable.   Diabetes Mellitus Type 2 with Hyperglycemia -Stable.  -Continue with Semglee 40 units subcu daily and now sensitive NovoLog sign scale insulin before meals and at bedtime has been added -Continue monitor blood sugars per protocol -CBG Trend: Recent Labs  Lab 08/18/22 2112 08/19/22 0718 08/19/22 1112 08/24/22 2250 08/25/22 0740 08/25/22 1159  GLUCAP 201* 110* 205* 170* 118* 164*  -Glucose Trend: Recent Labs  Lab 08/18/22 1422 08/19/22 0746 08/24/22 1543 08/25/22 0838  GLUCOSE 145* 125* 111* 159*    PAF (paroxysmal atrial fibrillation) (HCC) -Stable.  -Has PPM.  -Off Eliquis this week due to intermittent GI bleeding. -Continue with beta-blocker with carvedilol 6.25 mg p.o. twice daily -Monitor on telemetry if needed  Hypothyroidism -Continue levothyroxine 25 mcg p.o. daily and check TSH in the a.m.   GERD/GI Prophylaxis -C/w Pantoprazole 40 mg po Daily  Hyperbilirubinemia -Likely Reactive -Bilirubin Trend: Recent Labs  Lab 08/18/22 1422 08/19/22 0746 08/24/22 1543 08/25/22 0838  BILITOT 1.0 1.3* 1.0 1.4*  -Continue to Monitor and Trend and repeat CMP in the AM   Hypoalbuminemia -Patient's Albumin Trend: Recent Labs  Lab 08/18/22 1422 08/19/22 0746 08/24/22 1543 08/25/22 0838  ALBUMIN 3.0* 3.1* 3.1* 3.0*  -Continue to Monitor and Trend and repeat CMP in the AM   DVT prophylaxis: SCDs Start: 08/24/22 1946    Code Status: Full Code Family Communication: Discussed with his wife at bedside  Disposition Plan:  Level of care: Med-Surg Status is: Inpatient Remains inpatient appropriate because: Needs further clinical inpatient workup and undergoing EGD and video capsule endoscopy placement tomorrow   Consultants:  Gastroenterology  Procedures:  As delineated  as above  Antimicrobials:  Anti-infectives (From admission, onward)    None       Subjective: Seen and examined at bedside and thinks he is doing okay and feels okay now.  No nausea or vomiting.  Does not know the color of his stools as he had flushed it.  Wanting to know what the next steps were.  No chest pain or shortness breath.  No other concerns or complaints at this time.  Objective: Vitals:   08/25/22 0236 08/25/22 0441 08/25/22 0740 08/25/22 1348  BP: 116/67 124/72 124/72 116/62  Pulse: 60 60 (!) 59 60  Resp: 14 16 17 17   Temp: 98.5 F (36.9 C) 98 F (36.7 C) 98.1 F (36.7 C) 97.7 F (36.5 C)  TempSrc: Oral Oral    SpO2: 100%  100% 100%  Weight:      Height:        Intake/Output Summary (Last 24 hours) at 08/25/2022 1623 Last data filed at 08/25/2022 0441 Gross per 24 hour  Intake 1303 ml  Output 300 ml  Net 1003 ml   Filed Weights   08/24/22 1441 08/24/22 1456  Weight: 90.7 kg 90 kg  Examination: Physical Exam:  Constitutional: WN/WD elderly Caucasian male in no acute distress appears little frustrated Respiratory: Diminished to auscultation bilaterally, no wheezing, rales, rhonchi or crackles. Normal respiratory effort and patient is not tachypenic. No accessory muscle use.  Unlabored breathing Cardiovascular: RRR, no murmurs / rubs / gallops. S1 and S2 auscultated.  Has 1+ lower extremity edema Abdomen: Soft, non-tender, distended secondary body habitus. Bowel sounds positive.  GU: Deferred. Musculoskeletal: No clubbing / cyanosis of digits/nails. No joint deformity upper and lower extremities.  Skin: No rashes, lesions, ulcers on limited skin evaluation. No induration; Warm and dry.  Neurologic: CN 2-12 grossly intact with no focal deficits. Romberg sign and cerebellar reflexes not assessed.  Psychiatric: Normal judgment and insight. Alert and oriented x 3. Normal mood and appropriate affect.   Data Reviewed: I have personally reviewed following labs  and imaging studies  CBC: Recent Labs  Lab 08/19/22 0746 08/24/22 1543 08/25/22 0838  WBC 11.4* 9.5 8.6  NEUTROABS  --  6.7 6.1  HGB 7.8* 7.2* 9.4*  HCT 27.5* 25.0* 31.9*  MCV 86.8 85.0 85.5  PLT 195 230 243   Basic Metabolic Panel: Recent Labs  Lab 08/19/22 0746 08/24/22 1543 08/24/22 1653 08/25/22 0838  NA 136 132*  --  131*  K 4.2 5.3* 4.7 4.6  CL 105 99  --  100  CO2 21* 22  --  19*  GLUCOSE 125* 111*  --  159*  BUN 59* 71*  --  71*  CREATININE 2.67* 3.69*  --  3.75*  CALCIUM 9.0 8.6*  --  8.1*  MG  --   --   --  2.2  PHOS  --   --   --  4.9*   GFR: Estimated Creatinine Clearance: 17.7 mL/min (A) (by C-G formula based on SCr of 3.75 mg/dL (H)). Liver Function Tests: Recent Labs  Lab 08/19/22 0746 08/24/22 1543 08/25/22 0838  AST 21 21 23   ALT 15 16 16   ALKPHOS 126 151* 137*  BILITOT 1.3* 1.0 1.4*  PROT 7.7 7.9 7.2  ALBUMIN 3.1* 3.1* 3.0*   No results for input(s): "LIPASE", "AMYLASE" in the last 168 hours. Recent Labs  Lab 08/19/22 0746  AMMONIA 36*   Coagulation Profile: No results for input(s): "INR", "PROTIME" in the last 168 hours. Cardiac Enzymes: No results for input(s): "CKTOTAL", "CKMB", "CKMBINDEX", "TROPONINI" in the last 168 hours. BNP (last 3 results) No results for input(s): "PROBNP" in the last 8760 hours. HbA1C: No results for input(s): "HGBA1C" in the last 72 hours. CBG: Recent Labs  Lab 08/19/22 0718 08/19/22 1112 08/24/22 2250 08/25/22 0740 08/25/22 1159  GLUCAP 110* 205* 170* 118* 164*   Lipid Profile: No results for input(s): "CHOL", "HDL", "LDLCALC", "TRIG", "CHOLHDL", "LDLDIRECT" in the last 72 hours. Thyroid Function Tests: No results for input(s): "TSH", "T4TOTAL", "FREET4", "T3FREE", "THYROIDAB" in the last 72 hours. Anemia Panel: No results for input(s): "VITAMINB12", "FOLATE", "FERRITIN", "TIBC", "IRON", "RETICCTPCT" in the last 72 hours. Sepsis Labs: No results for input(s): "PROCALCITON", "LATICACIDVEN" in  the last 168 hours.  Recent Results (from the past 240 hour(s))  Urine Culture     Status: None   Collection Time: 08/18/22  3:59 PM   Specimen: Urine, Clean Catch  Result Value Ref Range Status   Specimen Description   Final    URINE, CLEAN CATCH Performed at [facility], [address] [place], [place] [address]    Special Requests   Final    NONE Performed  at [facility], [address], [place], [place] [address]    Culture   Final    NO GROWTH Performed at [facility], [address], [place], [place] [address]    Report Status 08/19/2022 FINAL  Final  Resp panel by RT-PCR (RSV, Flu A&B, Covid) Urine, Clean Catch     Status: None   Collection Time: 08/18/22  3:59 PM   Specimen: Urine, Clean Catch; Nasal Swab  Result Value Ref Range Status   SARS Coronavirus 2 by RT PCR NEGATIVE NEGATIVE Final    Comment: (NOTE) SARS-CoV-2 target nucleic acids are NOT DETECTED.  The SARS-CoV-2 RNA is generally detectable in upper respiratory specimens during the acute phase of infection. The lowest concentration of SARS-CoV-2 viral copies this assay can detect is 138 copies/mL. A negative result does not preclude SARS-Cov-2 infection and should not be used as the sole basis for treatment or other patient management decisions. A negative result may occur with  improper specimen collection/handling, submission of specimen other than nasopharyngeal swab, presence of viral mutation(s) within the areas targeted by this assay, and inadequate number of viral copies(<138 copies/mL). A negative result must be combined with clinical observations, patient history, and epidemiological information. The expected result is Negative.  Fact Sheet for Patients:  BloggerCourse.com  Fact Sheet for Healthcare Providers:  SeriousBroker.it  This test is no t yet approved or cleared by the [place] FDA and   has been authorized for detection and/or diagnosis of SARS-CoV-2 by FDA under an Emergency Use Authorization (EUA). This EUA will remain  in effect (meaning this test can be used) for the duration of the COVID-19 declaration under Section 564(b)(1) of the Act, 21 U.S.C.section 360bbb-3(b)(1), unless the authorization is terminated  or revoked sooner.       Influenza A by PCR NEGATIVE NEGATIVE Final   Influenza B by PCR NEGATIVE NEGATIVE Final    Comment: (NOTE) The Xpert Xpress SARS-CoV-2/FLU/RSV plus assay is intended as an aid in the diagnosis of influenza from Nasopharyngeal swab specimens and should not be used as a sole basis for treatment. Nasal washings and aspirates are unacceptable for Xpert Xpress SARS-CoV-2/FLU/RSV testing.  Fact Sheet for Patients: BloggerCourse.com  Fact Sheet for Healthcare Providers: SeriousBroker.it  This test is not yet approved or cleared by the [place] FDA and has been authorized for detection and/or diagnosis of SARS-CoV-2 by FDA under an Emergency Use Authorization (EUA). This EUA will remain in effect (meaning this test can be used) for the duration of the COVID-19 declaration under Section 564(b)(1) of the Act, 21 U.S.C. section 360bbb-3(b)(1), unless the authorization is terminated or revoked.     Resp Syncytial Virus by PCR NEGATIVE NEGATIVE Final    Comment: (NOTE) Fact Sheet for Patients: BloggerCourse.com  Fact Sheet for Healthcare Providers: SeriousBroker.it  This test is not yet approved or cleared by the [place] FDA and has been authorized for detection and/or diagnosis of SARS-CoV-2 by FDA under an Emergency Use Authorization (EUA). This EUA will remain in effect (meaning this test can be used) for the duration of the COVID-19 declaration under Section 564(b)(1) of the Act, 21 U.S.C. section 360bbb-3(b)(1),  unless the authorization is terminated or revoked.  Performed at [facility], [address], [place], [place] [address]     Radiology Studies: No results found.  Scheduled Meds:  allopurinol  100 mg Oral Daily   bisacodyl  10 mg Oral Once   [START ON 08/26/2022]  bisacodyl  10 mg Oral Once   carvedilol  6.25 mg Oral BID   furosemide  40 mg Oral QPM   furosemide  60 mg Oral Q breakfast   insulin aspart  0-5 Units Subcutaneous QHS   insulin aspart  0-9 Units Subcutaneous TID WC   insulin glargine-yfgn  40 Units Subcutaneous Daily   isosorbide mononitrate  30 mg Oral Daily   levothyroxine  25 mcg Oral Q0600   pantoprazole  40 mg Oral Daily   polyethylene glycol  17 g Oral Once   Continuous Infusions:   LOS: 0 days   Marguerita Merles, DO [facility] Available via Epic secure chat 7am-7pm After these hours, please refer to coverage provider listed on [email or web address] 08/25/2022, 4:23 PM

## 2022-08-25 NOTE — Plan of Care (Signed)

## 2022-08-25 NOTE — Plan of Care (Signed)
  Problem: Coping: Goal: Ability to adjust to condition or change in health will improve Outcome: Progressing   Problem: Fluid Volume: Goal: Ability to maintain a balanced intake and output will improve Outcome: Progressing   Problem: Health Behavior/Discharge Planning: Goal: Ability to identify and utilize available resources and services will improve Outcome: Progressing Goal: Ability to manage health-related needs will improve Outcome: Progressing   

## 2022-08-25 NOTE — Hospital Course (Signed)
The patient is an 82 year old Caucasian male with a past medical history significant for volume to chronic diastolic CHF, chronic kidney disease stage IV, history of proximal atrial fibrillation on anticoagulation which was recently held, history of recurrent GI bleeding, history of cirrhosis of the liver, history of chronic anemia, history of permanent pacemaker as well as other comorbidities who presented to the ED yesterday for worsening fatigue and lethargy.  He was recently admitted to the hospital last week for an AKI was given IV fluids and his diuretics were held and he was also given a unit of blood.  He had intermittent blood in his stools and he was currently undergoing outpatient GI workup and had a colonoscopy and upper endoscopy last year.  He is scheduled for a capsule endoscopy on Monday but he continued have increasing fatigue at home and wife noticed that his blood count had dropped on labs so she brought him into the ED for further evaluation.  He was typed and screened and transfused 2 units of PRBCs.  Renal function is started to worsen so IV fluid hydration has now held given that he also received blood and will start him back on diuresis.  GI was consulted for further evaluation and they are planning EGD with video capsule endoscopy to be placed.  Assessment and Plan:    GI Bleed and Acute on chronic Normocytic Anemia -Already had EGD/colonoscopy last year per pt and wife.  -Getting ready for capsule endoscopy next week by Dr. Rhea Belton but I consulted GI Inpatient and they evaluated and planning for a EGD with video capsule endoscopy to be placed as well in the a.m. and also start him on mild bowel regimen with bisacodyl 10 mg -Hgb/Hct Trend: Recent Labs  Lab 08/18/22 1422 08/19/22 0746 08/24/22 1543 08/25/22 0838  HGB 7.2* 7.8* 7.2* 9.4*  HCT 25.2* 27.5* 25.0* 31.9*  MCV 86.6 86.8 85.0 85.5  -Continue to Monitor for S/Sx of Bleeding;  -Repeat CBC in the AM   Benign Essential  Hypertension -Stable.  -Continue with Imdur and Coreg.  -Initially held Lasix and Aldactone due to AKI but now just resumed the Lasix and continuing to hold the Aldactone -Continue monitor blood pressures per protocol -Last blood pressure reading was   AKI on CKD (chronic kidney disease) stage 4, GFR 15-29 ml/min (HCC) Metabolic Acidosis -Acutely worse due to anemia and continued use of Lasix/Aldacone at home -Stopped IVF due to Hx of Anasarca and Diastolic CHF -Follows with nephrology at the [facility].  -Pt states he was told he is not ready for HD yet. -Patient has a slight metabolic acidosis with a CO2 of 19, anion gap of 12, Chloride Level of 100 -BUN/Cr Trend: Recent Labs  Lab 08/18/22 1422 08/19/22 0746 08/24/22 1543 08/25/22 0838  BUN 61* 59* 71* 71*  CREATININE 3.01* 2.67* 3.69* 3.75*  -Given IVF and Blood but appears Volume overloaded a little so will resume Home Diuretics  -Avoid Nephrotoxic Medications, Contrast Dyes, Hypotension and Dehydration to Ensure Adequate Renal Perfusion and will need to Renally Adjust Meds -Continue to Monitor and Trend Renal Function carefully and repeat CMP in the AM   Chronic Diastolic CHF (congestive heart failure) (HCC) Was Holding lasix, aldactone due to AKI but renal function worsened after and he is a little volume overloaded.  Now will resume his home Lasix of 60 mg in the a.m. and 40 mg in the evening -Continue with coreg, imdur. -Strict I's and O's and Daily Weights  Intake/Output Summary (Last 24 hours) at 08/25/2022 1611 Last data filed at 08/25/2022 0441 Gross per 24 hour  Intake 1303 ml  Output 300 ml  Net 1003 ml  -Continue to Monitor for S/Sx of Volume Overload -Obtain CXR in the AM   Hyponatremia -Na+ Trend Recent Labs  Lab 08/18/22 1422 08/19/22 0746 08/24/22 1543 08/25/22 0838  NA 138 136 132* 131*  -Resume Lasix and continue to Monitor  -Continue monitor and trend and repeat CMP in  a.m.  Pacemaker-Medtronic -Chronic.   Hyperlipidemia -Stable.   Diabetes Mellitus Type 2 with Hyperglycemia -Stable.  -Continue with Semglee 40 units subcu daily and now sensitive NovoLog sign scale insulin before meals and at bedtime has been added -Continue monitor blood sugars per protocol -CBG Trend: Recent Labs  Lab 08/18/22 2112 08/19/22 0718 08/19/22 1112 08/24/22 2250 08/25/22 0740 08/25/22 1159  GLUCAP 201* 110* 205* 170* 118* 164*  -Glucose Trend: Recent Labs  Lab 08/18/22 1422 08/19/22 0746 08/24/22 1543 08/25/22 0838  GLUCOSE 145* 125* 111* 159*    PAF (paroxysmal atrial fibrillation) (HCC) -Stable.  -Has PPM.  -Off Eliquis this week due to intermittent GI bleeding. -Continue with beta-blocker with carvedilol 6.25 mg p.o. twice daily -Monitor on telemetry if needed  Hypothyroidism -Continue levothyroxine 25 mcg p.o. daily and check TSH in the a.m.   GERD/GI Prophylaxis -C/w Pantoprazole 40 mg po Daily  Hyperbilirubinemia -Likely Reactive -Bilirubin Trend: Recent Labs  Lab 08/18/22 1422 08/19/22 0746 08/24/22 1543 08/25/22 0838  BILITOT 1.0 1.3* 1.0 1.4*  -Continue to Monitor and Trend and repeat CMP in the AM   Hypoalbuminemia -Patient's Albumin Trend: Recent Labs  Lab 08/18/22 1422 08/19/22 0746 08/24/22 1543 08/25/22 0838  ALBUMIN 3.0* 3.1* 3.1* 3.0*  -Continue to Monitor and Trend and repeat CMP in the AM

## 2022-08-25 NOTE — Progress Notes (Signed)
Mobility Specialist - Progress Note   08/25/22 1307  Mobility  Activity Ambulated independently in hallway  Level of Assistance Independent  Assistive Device None  Distance Ambulated (ft) 250 ft  Activity Response Tolerated well  Mobility Referral Yes  $Mobility charge 1 Mobility  Mobility Specialist Start Time (ACUTE ONLY) 1253  Mobility Specialist Stop Time (ACUTE ONLY) 1306  Mobility Specialist Time Calculation (min) (ACUTE ONLY) 13 min   Pt received EOB and agreeable to mobility. No complaints during session. Pt to EOB after session with all needs met.    [facility]

## 2022-08-25 NOTE — Consult Note (Signed)
Gastroenterology Inpatient Consultation   Attending Requesting Consult Marguerita Merles Mabscott, [facility] Day: 2  Reason for Consult Acute on chronic anemia (heme positive)    History of Present Illness  HAOCHEN HECHT is a 82 y.o. male with a pmh significant for CHF, CAD, A-fib (prior MAZE and on Eliquis - recently held), PVD, diabetes, hypertension, hyperlipidemia, COPD, bladder cancer, cirrhosis (complicated by portal hypertension manifested as ascites), colon polyps.  The GI service is consulted for evaluation and management of acute on chronic anemia (heme positive).  This patient was just here less than a week ago with heme positive acute on chronic anemia.  He was offered consideration of an endoscopic evaluation but wanted to leave the hospital.  As an outpatient he was urgently arrange for a video capsule endoscopy to be performed this Monday.  He [facility] and told to come to the emergency room as he was very low.  He was found to have a hemoglobin of 7.2.  He was transfused 2 units of blood and has had an appropriate bump of his hemoglobin to 9.4.  He denies any hematochezia or maroon stools.  He denies any progressive abdominal pain or discomfort.  He denies coffee-ground emesis or hematemesis.  He is not taking significant NSAIDs or BC/Goody powders.  He has been off of his Eliquis for 1 week.  He denies any hematuria from his previous bladder cancer history.  GI Review of Systems Positive as above Negative for pyrosis, dysphagia, jaundice, change in bowel habits   Review of Systems  General: Denies fevers/chills/weight loss unintentionally Cardiovascular: Denies chest pain Respiratory: Shortness of breath improved after blood transfusion Gastroenterological: See HPI Genitourinary: Denies darkened urine or hematuria Hematological: Positive for easy bruising Dermatological: Denies jaundice Psychological: Mood is  stable   Histories  Past Medical History Past Medical History:  Diagnosis Date   Acute on chronic diastolic heart failure (HCC) 03/20/2022   Anemia    ?   Carotid artery occlusion    Chronic diastolic CHF (congestive heart failure) (HCC) 04/23/2012   CKD (chronic kidney disease), stage III (HCC)    Claudication (HCC)    Coronary artery disease    s/p CABG 2013   Diabetic neuropathy (HCC)    mostly feet/ legs   Diverticulosis    Dyslipidemia    Exertional shortness of breath    Fracture of one rib, left side, initial encounter for closed fracture 02/01/2021   Gastritis and gastroduodenitis    GERD (gastroesophageal reflux disease)    GI bleed    while on triple anticoag therapy   HTN (hypertension)    Pacemaker    PAF (paroxysmal atrial fibrillation) (HCC)    s/p MAZE at time of CABG   PVD (peripheral vascular disease) (HCC)    R CEA 2011, prior PTA, left fem-pop 2014   Stroke (HCC)    vs TIA   Tubular adenoma of colon    Type II diabetes mellitus (HCC)    Past Surgical History:  Procedure Laterality Date   ABDOMINAL AORTAGRAM N/A 01/07/2012   Procedure: ABDOMINAL Ronny Flurry;  Surgeon: Chuck Hint, MD;  Location: [facility] CATH LAB;  Service: Cardiovascular;  Laterality: N/A;   BALLOON DILATION N/A 02/17/2013   Procedure: BALLOON DILATION;  Surgeon: Beverley Fiedler, MD;  Location: [facility] ENDOSCOPY;  Service: Gastroenterology;  Laterality: N/A;   BIOPSY  03/17/2021   Procedure: BIOPSY;  Surgeon: Iva Boop, MD;  Location: [facility] ENDOSCOPY;  Service: Gastroenterology;;   CAROTID ENDARTERECTOMY Right 03/2009   CATARACT EXTRACTION Bilateral    CHOLECYSTECTOMY     CIRCUMCISION     COLONOSCOPY WITH PROPOFOL N/A 02/17/2013   Procedure: COLONOSCOPY WITH PROPOFOL;  Surgeon: Beverley Fiedler, MD;  Location: [facility] ENDOSCOPY;  Service: Gastroenterology;  Laterality: N/A;   COLONOSCOPY WITH PROPOFOL N/A 03/18/2021   Procedure: COLONOSCOPY WITH PROPOFOL;  Surgeon: Jeani Hawking, MD;  Location: [facility]  ENDOSCOPY;  Service: Gastroenterology;  Laterality: N/A;   CORONARY ARTERY BYPASS GRAFT  06/14/2011   Procedure: CORONARY ARTERY BYPASS GRAFTING (CABG);  Surgeon: Alleen Borne, MD;  Location: [facility] OR;  Service: Open Heart Surgery;  Laterality: N/A;  Coronary Artery Bypass Graft on pump times six;  utilizing internal mammary artery and right greater saphenous vein harvested endoscopically.x 5 vessels   ELECTROPHYSIOLOGY STUDY N/A 11/05/2012   Procedure: ELECTROPHYSIOLOGY STUDY;  Surgeon: Duke Salvia, MD;  Location: [facility] CATH LAB;  Service: Cardiovascular;  Laterality: N/A;   ESOPHAGOGASTRODUODENOSCOPY (EGD) WITH PROPOFOL N/A 02/17/2013   Procedure: ESOPHAGOGASTRODUODENOSCOPY (EGD) WITH PROPOFOL;  Surgeon: Beverley Fiedler, MD;  Location: [facility] ENDOSCOPY;  Service: Gastroenterology;  Laterality: N/A;   ESOPHAGOGASTRODUODENOSCOPY (EGD) WITH PROPOFOL N/A 03/17/2021   Procedure: ESOPHAGOGASTRODUODENOSCOPY (EGD) WITH PROPOFOL;  Surgeon: Iva Boop, MD;  Location: [facility] ENDOSCOPY;  Service: Gastroenterology;  Laterality: N/A;   FEMORAL-POPLITEAL BYPASS GRAFT Left 03/06/2012   Procedure: BYPASS GRAFT FEMORAL-POPLITEAL ARTERY;  Surgeon: Chuck Hint, MD;  Location: [facility] OR;  Service: Vascular;  Laterality: Left;  Left Femoral - Below Knee Popliteal Bypass Graft with Vein and Intraoperative Arteriogram.   IR PARACENTESIS  05/23/2022   JOINT REPLACEMENT Left    left TKA   KNEE ARTHROSCOPY Left    "I had 3" (11/05/2012)   LEAD REVISION  11/05/2012   pacemaker/notes 11/05/2012   LEAD REVISION N/A 11/05/2012   Procedure: LEAD REVISION;  Surgeon: Duke Salvia, MD;  Location: [facility] CATH LAB;  Service: Cardiovascular;  Laterality: N/A;   MAZE  06/14/2011   Procedure: MAZE;  Surgeon: Alleen Borne, MD;  Location: [facility] OR;  Service: Open Heart Surgery;  Laterality: N/A;  Ligate left atrial appendage   PERMANENT PACEMAKER INSERTION N/A 06/20/2011   Procedure: PERMANENT PACEMAKER INSERTION;  Surgeon: Duke Salvia, MD;   Location: [facility] CATH LAB;  Service: Cardiovascular;  Laterality: N/A;   POLYPECTOMY  03/18/2021   Procedure: POLYPECTOMY;  Surgeon: Jeani Hawking, MD;  Location: [facility] ENDOSCOPY;  Service: Gastroenterology;;   TONSILLECTOMY AND ADENOIDECTOMY     TOTAL KNEE ARTHROPLASTY Left     Allergies Allergies  Allergen Reactions   Lipitor [Atorvastatin Calcium] Other (See Comments)    Weakness/ pain in legs    Family History Family History  Problem Relation Age of Onset   Coronary artery disease Father    Hypertension Father    Heart disease Father        Heart Disease before age 11   Heart attack Mother    Coronary artery disease Mother    Deep vein thrombosis Mother    Heart disease Mother        Heart Disease before age 73   Hyperlipidemia Mother    Hypertension Mother    Hypertension Sister    Heart disease Sister        Heart Disease before age 46   Hyperlipidemia Sister    Heart disease Brother        Heart Disease before age 50   Colon  cancer Brother 61   Anesthesia problems Neg Hx    Diabetes Neg Hx    The patient's FH is negative for IBD/IBS/Liver Disease/GI Malignancies.  Social History Social History   Socioeconomic History   Marital status: Married    Spouse name: Not on file   Number of children: Not on file   Years of education: Not on file   Highest education level: Not on file  Occupational History   Occupation: retired  Tobacco Use   Smoking status: Former    Current packs/day: 0.00    Average packs/day: 3.0 packs/day for 32.0 years (96.0 ttl pk-yrs)    Types: Cigarettes    Start date: 01/16/1947    Quit date: 01/16/1979    Years since quitting: 43.6   Smokeless tobacco: Never  Vaping Use   Vaping status: Never Used  Substance and Sexual Activity   Alcohol use: Yes    Alcohol/week: 2.0 standard drinks of alcohol    Types: 2 Glasses of wine per week    Comment: 1 glass of champagne with dinner, occasional beer   Drug use: No   Sexual activity: Not Currently   Other Topics Concern   Not on file  Social History Narrative   Not on file   Social Determinants of Health   Financial Resource Strain: Not on file  Food Insecurity: No Food Insecurity (08/25/2022)   Hunger Vital Sign    Worried About Running Out of Food in the Last Year: Never true    Ran Out of Food in the Last Year: Never true  Transportation Needs: No Transportation Needs (08/25/2022)   PRAPARE - Administrator, Civil Service (Medical): No    Lack of Transportation (Non-Medical): No  Physical Activity: Not on file  Stress: Not on file  Social Connections: Unknown (05/30/2021)   Received from [facility]   Social Network    Social Network: Not on file  Intimate Partner Violence: Not At Risk (08/25/2022)   Humiliation, Afraid, Rape, and Kick questionnaire    Fear of Current or Ex-Partner: No    Emotionally Abused: No    Physically Abused: No    Sexually Abused: No   The patient denies Drug, Alcohol, or Tobacco use.   Occupation is Travel   Medications  Home Medications No current facility-administered medications on file prior to encounter.   Current Outpatient Medications on File Prior to Encounter  Medication Sig Dispense Refill   acetaminophen (TYLENOL) 650 MG CR tablet Take 100 mg by mouth daily.     allopurinol (ZYLOPRIM) 100 MG tablet Take 100 mg by mouth daily.     carvedilol (COREG) 12.5 MG tablet Take 6.25 mg by mouth 2 (two) times daily with a meal.     cholecalciferol (VITAMIN D3) 25 MCG (1000 UNIT) tablet Take 2,000 Units by mouth daily.     cyanocobalamin 1000 MCG tablet Take 1,000 mcg by mouth daily.     furosemide (LASIX) 20 MG tablet Take 40-60 mg by mouth See admin instructions. Takes 60 mg in the morning and 40 mg at night     insulin glargine (LANTUS) 100 UNIT/ML injection Inject 48 Units into the skin in the morning.     isosorbide mononitrate (IMDUR) 30 MG 24 hr tablet Take 30 mg by mouth daily.     levothyroxine  (SYNTHROID) 25 MCG tablet Take 25 mcg by mouth daily before breakfast.     magnesium oxide (MAG-OX) 400 (240 Mg) MG tablet Take  2 tablets by mouth daily.     Multiple Vitamin (MULTIVITAMIN) capsule Take 1 capsule by mouth daily.     pantoprazole (PROTONIX) 40 MG tablet Take 10 mg by mouth daily.     Propylene Glycol (SYSTANE BALANCE) 0.6 % SOLN Place 1 drop into both eyes daily as needed.     Semaglutide, 1 MG/DOSE, 2 MG/1.5ML SOPN Inject 1 mg into the skin every Sunday.     spironolactone (ALDACTONE) 25 MG tablet Take 1 tablet (25 mg total) by mouth daily. 30 tablet 0   testosterone cypionate (DEPOTESTOSTERONE CYPIONATE) 200 MG/ML injection Inject 200 mg into the muscle every 14 (fourteen) days. Given every other Sunday     Zinc 50 MG TABS Take 1 tablet (50 mg total) by mouth daily. 30 tablet 0   Scheduled Inpatient Medications  allopurinol  100 mg Oral Daily   bisacodyl  10 mg Oral Once   [START ON 08/26/2022] bisacodyl  10 mg Oral Once   carvedilol  6.25 mg Oral BID   furosemide  40 mg Oral QPM   furosemide  60 mg Oral Q breakfast   insulin aspart  0-5 Units Subcutaneous QHS   insulin aspart  0-9 Units Subcutaneous TID WC   insulin glargine-yfgn  40 Units Subcutaneous Daily   isosorbide mononitrate  30 mg Oral Daily   levothyroxine  25 mcg Oral Q0600   pantoprazole  40 mg Oral Daily   polyethylene glycol  17 g Oral Once   Continuous Inpatient Infusions  PRN Inpatient Medications acetaminophen **OR** acetaminophen, melatonin, ondansetron **OR** ondansetron (ZOFRAN) IV   Physical Examination  BP 116/62 (BP Location: Right Arm)   Pulse 60   Temp 97.7 F (36.5 C)   Resp 17   Ht 5\' 10"  (1.778 m)   Wt 90 kg   SpO2 100%   BMI 28.47 kg/m  GEN: NAD, appears stated age, doesn't appear chronically ill PSYCH: Cooperative, without pressured speech EYE: Conjunctivae pink, sclerae anicteric ENT: MMM CV: Nontachycardic RESP: Decreased breath sounds at bases without audible  wheezing GI: NABS, protuberant abdomen, rounded soft, NT/ND, without rebound or guarding, no HSM appreciated GU: DRE shows evidence of skin tags, external hemorrhoids, palpation of internal hemorrhoids, and brown stool with dark black flecks MSK/EXT: Trace bilateral pedal edema SKIN: No jaundice, no spider angiomata, no concerning rashes NEURO:  Alert & Oriented x 3, no focal deficits, no evidence of asterixis   Review of Data  I reviewed the following data at the time of this encounter:  Laboratory Studies   Recent Labs  Lab 08/25/22 0838  NA 131*  K 4.6  CL 100  CO2 19*  BUN 71*  CREATININE 3.75*  GLUCOSE 159*  CALCIUM 8.1*  MG 2.2  PHOS 4.9*   Recent Labs  Lab 08/25/22 0838  AST 23  ALT 16  ALKPHOS 137*    Recent Labs  Lab 08/19/22 0746 08/24/22 1543 08/25/22 0838  WBC 11.4* 9.5 8.6  HGB 7.8* 7.2* 9.4*  HCT 27.5* 25.0* 31.9*  PLT 195 230 243   No results for input(s): "APTT", "INR" in the last 168 hours. MELD 3.0: 24 at 05/26/2022  1:12 AM MELD-Na: 24 at 05/26/2022  1:12 AM Calculated from: Serum Creatinine: 2.82 mg/dL at 5/40/9811  9:14 AM Serum Sodium: 136 mmol/L at 05/26/2022  1:12 AM Total Bilirubin: 1.4 mg/dL at 7/82/9562  1:30 AM Serum Albumin: 2.6 g/dL at 8/65/7846  9:62 AM INR(ratio): 1.6 at 05/25/2022 10:51 AM Age at listing (hypothetical): 39  years Sex: Male at 05/26/2022  1:12 AM  Imaging Studies  March 2024 abdominal ultrasound IMPRESSION: Small ascites. Nodularity in liver surface suggests cirrhosis. Splenomegaly. Status post cholecystectomy. No sonographic abnormalities are seen in the kidneys.  GI Procedures and Studies  EGD March 2023 showed gastritis with biopsies showing slight chronic inflammation and reactive changes, no H. pylori.   Colonoscopy March 2023 showed 2 polyps and diverticulosis.  These were tubular adenomas on pathology.   Assessment  Mr. Tricarico is a 82 y.o. male with a pmh significant for CHF, CAD, A-fib (prior  MAZE and on Eliquis - recently held), PVD, diabetes, hypertension, hyperlipidemia, COPD, bladder cancer, cirrhosis (complicated by portal hypertension manifested as ascites), colon polyps.  The patient is hemodynamically stable at this time.  He has dropped his hemoglobin by 1 g, basically the amount of blood he received last week.  He seems to have stabilized his blood count currently.  Based on his chronic renal insufficiency, I suspect he has angiectasias that are probably the etiology for his symptoms.  As there are some black flecks, which may be iron related, I do think it is reasonable to repeat an upper endoscopy to just ensure that he has not had any progression of his liver disease/cirrhosis and varices.  I will plan to proceed with this upper endoscopy tomorrow.  I will plan to proceed with a video capsule endoscopy unless I find an overt cause for iron deficiency on his upper endoscopic evaluation.  The risks and benefits of endoscopic evaluation were discussed with the patient; these include but are not limited to the risk of perforation, infection, bleeding, missed lesions, lack of diagnosis, severe illness requiring hospitalization, as well as anesthesia and sedation related illnesses.  The patient and/or family is agreeable to proceed.   Plan/Recommendations  N.p.o. at midnight EGD +/- video capsule endoscopy on 8/11 Dulcolax 10 mg x 1 dose today MiraLAX 17 g x 1 dose today Dulcolax 10 mg x 1 dose tomorrow Recommend IV iron infusions while in-house If patient needs anticoagulation sooner, would consider heparin drip rather than DOAC initiation for now   Thank you for this consult.  We will continue to follow.  Please page/call with questions or concerns.   Corliss Parish, MD [facility] Advanced Endoscopy Office # [phone]

## 2022-08-26 ENCOUNTER — Encounter (HOSPITAL_COMMUNITY): Payer: Self-pay | Admitting: Internal Medicine

## 2022-08-26 ENCOUNTER — Inpatient Hospital Stay (HOSPITAL_COMMUNITY): Payer: No Typology Code available for payment source | Admitting: Certified Registered Nurse Anesthetist

## 2022-08-26 ENCOUNTER — Encounter (HOSPITAL_COMMUNITY)
Admission: EM | Disposition: A | Discharge status: Home / Self Care | Payer: Self-pay | Source: Home / Self Care | Attending: Internal Medicine

## 2022-08-26 DIAGNOSIS — K766 Portal hypertension: Secondary | ICD-10-CM | POA: Diagnosis not present

## 2022-08-26 DIAGNOSIS — D509 Iron deficiency anemia, unspecified: Secondary | ICD-10-CM | POA: Diagnosis not present

## 2022-08-26 DIAGNOSIS — I130 Hypertensive heart and chronic kidney disease with heart failure and stage 1 through stage 4 chronic kidney disease, or unspecified chronic kidney disease: Secondary | ICD-10-CM

## 2022-08-26 DIAGNOSIS — K31819 Angiodysplasia of stomach and duodenum without bleeding: Secondary | ICD-10-CM

## 2022-08-26 DIAGNOSIS — K31811 Angiodysplasia of stomach and duodenum with bleeding: Secondary | ICD-10-CM | POA: Diagnosis not present

## 2022-08-26 DIAGNOSIS — K2289 Other specified disease of esophagus: Secondary | ICD-10-CM

## 2022-08-26 DIAGNOSIS — Z87891 Personal history of nicotine dependence: Secondary | ICD-10-CM

## 2022-08-26 DIAGNOSIS — N179 Acute kidney failure, unspecified: Secondary | ICD-10-CM | POA: Diagnosis not present

## 2022-08-26 DIAGNOSIS — I5032 Chronic diastolic (congestive) heart failure: Secondary | ICD-10-CM | POA: Diagnosis not present

## 2022-08-26 DIAGNOSIS — N184 Chronic kidney disease, stage 4 (severe): Secondary | ICD-10-CM

## 2022-08-26 DIAGNOSIS — K449 Diaphragmatic hernia without obstruction or gangrene: Secondary | ICD-10-CM | POA: Diagnosis not present

## 2022-08-26 DIAGNOSIS — I8500 Esophageal varices without bleeding: Secondary | ICD-10-CM

## 2022-08-26 DIAGNOSIS — K3189 Other diseases of stomach and duodenum: Secondary | ICD-10-CM | POA: Diagnosis not present

## 2022-08-26 DIAGNOSIS — D649 Anemia, unspecified: Secondary | ICD-10-CM | POA: Diagnosis not present

## 2022-08-26 HISTORY — PX: GIVENS CAPSULE STUDY: SHX5432

## 2022-08-26 HISTORY — PX: GI RADIOFREQUENCY ABLATION: SHX6807

## 2022-08-26 HISTORY — PX: ESOPHAGOGASTRODUODENOSCOPY: SHX5428

## 2022-08-26 HISTORY — PX: BIOPSY: SHX5522

## 2022-08-26 LAB — CBC WITH DIFFERENTIAL/PLATELET
Abs Immature Granulocytes: 0.08 10*3/uL — ABNORMAL HIGH (ref 0.00–0.07)
Basophils Absolute: 0.1 10*3/uL (ref 0.0–0.1)
Basophils Relative: 1 %
Eosinophils Absolute: 0.4 10*3/uL (ref 0.0–0.5)
Eosinophils Relative: 5 %
HCT: 29.2 % — ABNORMAL LOW (ref 39.0–52.0)
Hemoglobin: 8.7 g/dL — ABNORMAL LOW (ref 13.0–17.0)
Immature Granulocytes: 1 %
Lymphocytes Relative: 7 %
Lymphs Abs: 0.6 10*3/uL — ABNORMAL LOW (ref 0.7–4.0)
MCH: 25.6 pg — ABNORMAL LOW (ref 26.0–34.0)
MCHC: 29.8 g/dL — ABNORMAL LOW (ref 30.0–36.0)
MCV: 85.9 fL (ref 80.0–100.0)
Monocytes Absolute: 1.3 10*3/uL — ABNORMAL HIGH (ref 0.1–1.0)
Monocytes Relative: 15 %
Neutro Abs: 6.1 10*3/uL (ref 1.7–7.7)
Neutrophils Relative %: 71 %
Platelets: 229 10*3/uL (ref 150–400)
RBC: 3.4 MIL/uL — ABNORMAL LOW (ref 4.22–5.81)
RDW: 18.5 % — ABNORMAL HIGH (ref 11.5–15.5)
WBC: 8.6 10*3/uL (ref 4.0–10.5)
nRBC: 0 % (ref 0.0–0.2)

## 2022-08-26 LAB — GLUCOSE, CAPILLARY
Glucose-Capillary: 103 mg/dL — ABNORMAL HIGH (ref 70–99)
Glucose-Capillary: 101 mg/dL — ABNORMAL HIGH (ref 70–99)
Glucose-Capillary: 144 mg/dL — ABNORMAL HIGH (ref 70–99)
Glucose-Capillary: 176 mg/dL — ABNORMAL HIGH (ref 70–99)

## 2022-08-26 LAB — COMPREHENSIVE METABOLIC PANEL WITH GFR
ALT: 14 U/L (ref 0–44)
AST: 18 U/L (ref 15–41)
Albumin: 2.9 g/dL — ABNORMAL LOW (ref 3.5–5.0)
Alkaline Phosphatase: 132 U/L — ABNORMAL HIGH (ref 38–126)
Anion gap: 12 (ref 5–15)
BUN: 69 mg/dL — ABNORMAL HIGH (ref 8–23)
CO2: 21 mmol/L — ABNORMAL LOW (ref 22–32)
Calcium: 8.1 mg/dL — ABNORMAL LOW (ref 8.9–10.3)
Chloride: 101 mmol/L (ref 98–111)
Creatinine, Ser: 3.61 mg/dL — ABNORMAL HIGH (ref 0.61–1.24)
GFR, Estimated: 16 mL/min — ABNORMAL LOW (ref >60)
Glucose, Bld: 62 mg/dL — ABNORMAL LOW (ref 70–99)
Potassium: 4.7 mmol/L (ref 3.5–5.1)
Sodium: 134 mmol/L — ABNORMAL LOW (ref 135–145)
Total Bilirubin: 1.4 mg/dL — ABNORMAL HIGH (ref 0.3–1.2)
Total Protein: 7.3 g/dL (ref 6.5–8.1)

## 2022-08-26 LAB — PHOSPHORUS: Phosphorus: 4.3 mg/dL (ref 2.5–4.6)

## 2022-08-26 LAB — MAGNESIUM: Magnesium: 2.4 mg/dL (ref 1.7–2.4)

## 2022-08-26 SURGERY — EGD (ESOPHAGOGASTRODUODENOSCOPY)
Anesthesia: Monitor Anesthesia Care

## 2022-08-26 MED ORDER — CIPROFLOXACIN IN D5W 400 MG/200ML IV SOLN
400.0000 mg | INTRAVENOUS | Status: DC
  Administered 2022-08-26: 400 mg via INTRAVENOUS
  Filled 2022-08-26: qty 200

## 2022-08-26 MED ORDER — PROPOFOL 10 MG/ML IV BOLUS
INTRAVENOUS | Status: DC | PRN
  Administered 2022-08-26: 10 mg via INTRAVENOUS
  Administered 2022-08-26: 20 mg via INTRAVENOUS
  Administered 2022-08-26: 10 mg via INTRAVENOUS

## 2022-08-26 MED ORDER — CIPROFLOXACIN IN D5W 400 MG/200ML IV SOLN
INTRAVENOUS | Status: AC
  Filled 2022-08-26: qty 200

## 2022-08-26 MED ORDER — PROPOFOL 10 MG/ML IV BOLUS
INTRAVENOUS | Status: AC
  Filled 2022-08-26: qty 20

## 2022-08-26 MED ORDER — CIPROFLOXACIN HCL 500 MG PO TABS: 500.0000 mg | ORAL_TABLET | Freq: Two times a day (BID) | ORAL | Status: DC

## 2022-08-26 MED ORDER — CIPROFLOXACIN HCL 500 MG PO TABS
250.0000 mg | ORAL_TABLET | Freq: Two times a day (BID) | ORAL | Status: DC
  Administered 2022-08-27 – 2022-08-28 (×3): 250 mg via ORAL
  Filled 2022-08-26 (×3): qty 1

## 2022-08-26 MED ORDER — PHENYLEPHRINE 80 MCG/ML (10ML) SYRINGE FOR IV PUSH (FOR BLOOD PRESSURE SUPPORT)
PREFILLED_SYRINGE | INTRAVENOUS | Status: DC | PRN
  Administered 2022-08-26: 80 ug via INTRAVENOUS

## 2022-08-26 MED ORDER — ONDANSETRON HCL 4 MG/2ML IJ SOLN
INTRAMUSCULAR | Status: DC | PRN
  Administered 2022-08-26 (×2): 4 mg via INTRAVENOUS

## 2022-08-26 MED ORDER — LACTATED RINGERS IV SOLN
INTRAVENOUS | Status: DC | PRN
Start: 2022-08-26 — End: 2022-08-26

## 2022-08-26 MED ORDER — SUCRALFATE 1 G PO TABS
1.0000 g | ORAL_TABLET | Freq: Three times a day (TID) | ORAL | Status: DC
  Administered 2022-08-26 – 2022-08-28 (×7): 1 g via ORAL
  Filled 2022-08-26 (×7): qty 1

## 2022-08-26 MED ORDER — PROPOFOL 500 MG/50ML IV EMUL
INTRAVENOUS | Status: DC | PRN
  Administered 2022-08-26: 125 ug/kg/min via INTRAVENOUS

## 2022-08-26 MED ORDER — LIDOCAINE 2% (20 MG/ML) 5 ML SYRINGE
INTRAMUSCULAR | Status: DC | PRN
  Administered 2022-08-26: 100 mg via INTRAVENOUS

## 2022-08-26 MED ORDER — PANTOPRAZOLE SODIUM 40 MG PO TBEC
40.0000 mg | DELAYED_RELEASE_TABLET | Freq: Two times a day (BID) | ORAL | Status: DC
  Administered 2022-08-26 – 2022-08-28 (×4): 40 mg via ORAL
  Filled 2022-08-26 (×4): qty 1

## 2022-08-26 MED ORDER — CIPROFLOXACIN IN D5W 400 MG/200ML IV SOLN: 400.0000 mg | Freq: Two times a day (BID) | INTRAVENOUS | Status: DC

## 2022-08-26 SURGICAL SUPPLY — 1 items: TOWEL COTTON PACK 4EA (MISCELLANEOUS) ×4 IMPLANT

## 2022-08-26 NOTE — Op Note (Addendum)
[facility] Patient Name: Edward [name] Procedure Date: 08/26/2022 MRN: [id number] Attending MD: Corliss Parish , MD, [id number] Date of Birth: 1940/03/04 CSN: [id number] Age: 82 Admit Type: Inpatient Procedure:                Upper GI endoscopy Indications:              Iron deficiency anemia, Heme positive stool Providers:                Corliss Parish, MD, Janae Sauce. Steele Berg, RN, Priscella Mann, Technician Referring MD:             Carie Caddy. Pyrtle, MD, Inpatient Medical Service Medicines:                Monitored Anesthesia Care Complications:            No immediate complications. Estimated Blood Loss:     Estimated blood loss was minimal. Procedure:                Pre-Anesthesia Assessment:                           - Prior to the procedure, a History and Physical                            was performed, and patient medications and                            allergies were reviewed. The patient's tolerance of                            previous anesthesia was also reviewed. The risks                            and benefits of the procedure and the sedation                            options and risks were discussed with the patient.                            All questions were answered, and informed consent                            was obtained. Prior Anticoagulants: The patient has                            taken Eliquis (apixaban), last dose was 7 days                            prior to procedure. ASA Grade Assessment: III - A                            patient with severe systemic disease. After  reviewing the risks and benefits, the patient was                            deemed in satisfactory condition to undergo the                            procedure.                           After obtaining informed consent, the endoscope was                            passed under direct vision. Throughout the                             procedure, the patient's blood pressure, pulse, and                            oxygen saturations were monitored continuously. The                            GIF-H190 ([id number]) Olympus endoscope was introduced                            through the mouth, and advanced to the second part                            of duodenum. The upper GI endoscopy was                            accomplished without difficulty. The patient                            tolerated the procedure. Scope In: Scope Out: Findings:      No gross lesions were noted in the proximal esophagus and in the mid       esophagus.      Small grade I varices were found in the distal esophagus.      The Z-line was irregular and was found 37 cm from the incisors.      A 4 cm hiatal hernia was present.      Mild portal hypertensive gastropathy was found in the cardia, in the       gastric fundus and in the gastric body.      Patchy mildly erythematous mucosa without bleeding was found in the       entire examined stomach. Biopsies were taken with a cold forceps for       histology and Helicobacter pylori testing.      Moderate, diffuse gastric antral vascular ectasia with some small areas       of clot that when removed showed active oozing were present in the       gastric antrum. Focal radiofrequency ablation of gastric antral vascular       ectasia in the stomach was performed. With the endoscope in place, the       position and extent of the abnormal mucosa and appropriate anatomic  landmarks were noted. The abnormal mucosa was irrigated with water.       Gastric contents were suctioned. The endoscope was then removed from the       patient. The Barrx-90 radiofrequency ablation catheter was attached to       the tip of the endoscope. The endoscope with the attached radiofrequency       ablation catheter was then passed under direct vision and advanced to       the areas of abnormal mucosa.  The radiofrequency ablation catheter was       placed in contact with the surface of the abnormal mucosa under direct       visualization and energy was applied twice at 12 J/cm2. Ablation was       repeated in a likewise fashion to all visible abnormal mucosa. The areas       where abnormal mucosa had been ablated were examined. Areas of abnormal       mucosa appeared completely ablated. Whitish changes of ablated mucosa       were present. The total number of energy applications for all mucosal       sites treated was 62. There was no evidence for perforation.      No gross lesions were noted in the duodenal bulb, in the first portion       of the duodenum and in the second portion of the duodenum.      Using the endoscope, the video capsule enteroscope was advanced into the       duodenal bulb and released. Impression:               - No gross lesions in the proximal esophagus and in                            the mid esophagus.                           - Small grade I esophageal varices noted distally.                           - Z-line irregular, 37 cm from the incisors.                           - 4 cm hiatal hernia.                           - Portal hypertensive gastropathy proximally.                           - Erythematous mucosa in the stomach. Biopsied.                           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 Treated with                            radiofrequency ablation.                           -  No gross lesions in the duodenal bulb, in the                            first portion of the duodenum and in the second                            portion of the duodenum.                           - Successful completion of the Video Capsule                            Enteroscope placement into the bulb. Moderate Sedation:      Not Applicable - Patient had care per Anesthesia. Recommendation:            - The patient will be observed post-procedure,                            until all discharge criteria are met.                           - Return patient to hospital ward for ongoing care.                           - Patient has a contact number available for                            emergencies. The signs and symptoms of potential                            delayed complications were discussed with the                            patient. Return to normal activities tomorrow.                            Written discharge instructions were provided to the                            patient.                           - Diet as per VCE protocol (see RN notes for                            details).                           - PPI 40 mg twice daily.                           - Carafate 1 g QID x 4-weeks and then BID.                           - Ciprofloxacin 500  mg BID x 3-days to decrease                            post-interventional infectious risk.                           - Followup VCE pickup tomorrow.                           - Repeat EGD in 20-months for repeat evaluation of                            GAVE is reasonable and can be pursued by primary GI                            if any further treatment may be necessary.                           - If anticoagulation to restart, then would give at                            least 72 hours (8/14) before restart. But this is                            also to be determined based on findings of the VCE.                           - The findings and recommendations were discussed                            with the patient.                           - The findings and recommendations were discussed                            with the patient's family.                           - The findings and recommendations were discussed                            with the referring physician. Procedure Code(s):        --- Professional ---                            43255, 59, Esophagogastroduodenoscopy, flexible,                            transoral; with control of bleeding, any method                           43239, Esophagogastroduodenoscopy, flexible,  transoral; with biopsy, single or multiple Diagnosis Code(s):        --- Professional ---                           I85.00, Esophageal varices without bleeding                           K22.89, Other specified disease of esophagus                           K44.9, Diaphragmatic hernia without obstruction or                            gangrene                           K76.6, Portal hypertension                           K31.89, Other diseases of stomach and duodenum                           K31.811, Angiodysplasia of stomach and duodenum                            with bleeding                           D50.9, Iron deficiency anemia, unspecified                           R19.5, Other fecal abnormalities CPT copyright 2022 [employer]. All rights reserved. The codes documented in this report are preliminary and upon coder review may  be revised to meet current compliance requirements. Corliss Parish, MD 08/26/2022 11:29:46 AM Number of Addenda: 0

## 2022-08-26 NOTE — Progress Notes (Signed)
PHARMACY NOTE:  ANTIMICROBIAL RENAL DOSAGE ADJUSTMENT  Current antimicrobial regimen includes a mismatch between antimicrobial dosage and estimated renal function.  As per policy approved by the Pharmacy & Therapeutics and Medical Executive Committees, the antimicrobial dosage will be adjusted accordingly.  Current antimicrobial dosage:  Cipro 500 mg po BID x 3 days  Indication: to decrease  post-interventional infectious risk.   Renal Function:  Estimated Creatinine Clearance: 18.4 mL/min (A) (by C-G formula based on SCr of 3.61 mg/dL (H)). []      On intermittent HD, scheduled: []      On CRRT    Antimicrobial dosage has been changed to:  Cipro 250 mg po BID x 3 days  Additional comments:   Thank you for allowing pharmacy to be a part of this patient's care.  Lynden Ang, [facility] 08/26/2022 4:12 PM

## 2022-08-26 NOTE — Progress Notes (Signed)
Patient with a little nausea no vomit just dry heave. Josh CRNA gave nausea med. Patient stating feel better at this time. Patient stated mucous from post nasal drip was causing him t o cough.

## 2022-08-26 NOTE — Progress Notes (Signed)
PROGRESS NOTE    Edward [name]  UEA:[id number] DOB: 27-Jan-1940 DOA: 08/24/2022 PCP: Byrd Hesselbach, PA   Brief Narrative:  The patient is an 82 year old Caucasian male with a past medical history significant for volume to chronic diastolic CHF, chronic kidney disease stage IV, history of proximal atrial fibrillation on anticoagulation which was recently held, history of recurrent GI bleeding, history of cirrhosis of the liver, history of chronic anemia, history of permanent pacemaker as well as other comorbidities who presented to the ED yesterday for worsening fatigue and lethargy.  He was recently admitted to the hospital last week for an AKI was given IV fluids and his diuretics were held and he was also given a unit of blood.  He had intermittent blood in his stools and he was currently undergoing outpatient GI workup and had a colonoscopy and upper endoscopy last year.  He is scheduled for a capsule endoscopy on Monday but he continued have increasing fatigue at home and wife noticed that his blood count had dropped on labs so she brought him into the ED for further evaluation.  He was typed and screened and transfused 2 units of PRBCs.  Renal function is started to worsen so IV fluid hydration has now held given that he also received blood and will start him back on diuresis.  GI was consulted for further evaluation and they are planning EGD with video capsule endoscopy to be placed.  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  Patient was treated with radiofrequency ablation and GI recommending Diet advancement per VCE protocol.   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  -Getting ready for capsule endoscopy next week by Dr. Rhea Belton but I consulted GI Inpatient and they evaluated and planning for a EGD with video capsule endoscopy to be placed as  well in the a.m. and also start him on mild bowel regimen with bisacodyl 10 mg -Hgb/Hct Trend: Recent Labs  Lab 08/18/22 1422 08/19/22 0746 08/24/22 1543 08/25/22 0838 08/26/22 0530  HGB 7.2* 7.8* 7.2* 9.4* 8.7*  HCT 25.2* 27.5* 25.0* 31.9* 29.2*  MCV 86.6 86.8 85.0 85.5 85.9  -Continue to Monitor for S/Sx of Bleeding;  -EGD done and showed " Portal hypertensive gastropathy proximally. Erythematous mucosa in the stomach. 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 Treated with   radiofrequency ablation. No gross lesions in the duodenal bulb, in the    first portion of the duodenum and in the second portion of the duodenum. Successful completion of the Video Capsule Enteroscope placement into the bulb." -Repeat CBC in the AM  -GI recommending advancing the diet per the VCE protocol and continuing PPI 40 mg p.o. twice daily and adding Carafate 1 g 4 times daily for 4 weeks and then twice daily. -They are initiating patient on ciprofloxacin 500 g twice daily for 3 days to decrease the post interventional infectious risk and repeating EGD in 2 months for repeat evaluation of GAVE and following on the VCE tomorrow. -GI feels that the patient can be restarted on anticoagulation 08/29/2022 but needs to be determined on the findings of VCE  Benign Essential Hypertension -Stable.  -Continue with Imdur and Coreg.  -Initially held Lasix and Aldactone due to AKI but now just resumed the Lasix and continuing to hold the Aldactone -  Continue monitor blood pressures per protocol -Last blood pressure reading was 119/56   AKI on CKD (chronic kidney disease) stage 4, GFR 15-29 ml/min (HCC) Metabolic Acidosis, improved  -Acutely worse due to anemia and continued use of Lasix/Aldacone at home -Stopped IVF due to Hx of Anasarca and Diastolic CHF -Follows with nephrology at the [facility] medical center.  -Pt states he was told he is not ready for HD yet. -Patient had  a slight metabolic acidosis with a CO2 of 19, anion gap of 12, Chloride Level of 100; Now CO2 is 21, AG is 12, and Chloride Level is 101 -BUN/Cr Trend: Recent Labs  Lab 08/18/22 1422 08/19/22 0746 08/24/22 1543 08/25/22 0838 08/26/22 0530  BUN 61* 59* 71* 71* 69*  CREATININE 3.01* 2.67* 3.69* 3.75* 3.61*  -Given IVF and Blood but appears Volume overloaded a little so will resume Home Diuretics with just Furosemide 60 mg in the AM and 40 mg po qHS -Avoid Nephrotoxic Medications, Contrast Dyes, Hypotension and Dehydration to Ensure Adequate Renal Perfusion and will need to Renally Adjust Meds -Continue to Monitor and Trend Renal Function carefully and repeat CMP in the AM   Chronic Diastolic CHF (congestive heart failure) (HCC) Was Holding lasix, aldactone due to AKI but renal function worsened after and he is a little volume overloaded.  Now will resume his home Lasix of 60 mg in the a.m. and 40 mg in the evening -Continue with coreg, imdur. -Strict I's and O's and Daily Weights  Intake/Output Summary (Last 24 hours) at 08/26/2022 1846 Last data filed at 08/26/2022 1700 Gross per 24 hour  Intake 400 ml  Output --  Net 400 ml  -Continue to Monitor for S/Sx of Volume Overload -Obtain CXR in the AM   Hyponatremia -Na+ Trend Recent Labs  Lab 08/18/22 1422 08/19/22 0746 08/24/22 1543 08/25/22 0838 08/26/22 0530  NA 138 136 132* 131* 134*  -Resume Lasix and continue to Monitor  -Continue monitor and trend and repeat CMP in a.m.  Pacemaker-[employer] -Chronic.   Hyperlipidemia -Stable.   Diabetes Mellitus Type 2 with Hyperglycemia -Stable.  -Continue with Semglee 40 units subcu daily and now sensitive NovoLog sign scale insulin before meals and at bedtime has been added -Continue monitor blood sugars per protocol -CBG Trend: Recent Labs  Lab 08/25/22 0740 08/25/22 1159 08/25/22 1628 08/25/22 2002 08/26/22 0729 08/26/22 1321 08/26/22 1647  GLUCAP 118* 164* 166* 132*  103* 101* 144*  -Glucose Trend: Recent Labs  Lab 08/18/22 1422 08/19/22 0746 08/24/22 1543 08/25/22 0838 08/26/22 0530  GLUCOSE 145* 125* 111* 159* 62*    PAF (paroxysmal atrial fibrillation) (HCC) -Stable.  -Has PPM.  -Off Eliquis this week due to intermittent GI bleeding. -Continue with beta-blocker with carvedilol 6.25 mg p.o. twice daily -Monitor on telemetry if needed  Hypothyroidism -Continue Levothyroxine 25 mcg p.o. daily and check TSH in the a.m.   GERD/GI Prophylaxis -C/w Pantoprazole 40 mg po Daily  Hyperbilirubinemia -Likely Reactive -Bilirubin Trend: Recent Labs  Lab 08/18/22 1422 08/19/22 0746 08/24/22 1543 08/25/22 0838 08/26/22 0530  BILITOT 1.0 1.3* 1.0 1.4* 1.4*  -Continue to Monitor and Trend and repeat CMP in the AM   Hypoalbuminemia -Patient's Albumin Trend: Recent Labs  Lab 08/18/22 1422 08/19/22 0746 08/24/22 1543 08/25/22 0838 08/26/22 0530  ALBUMIN 3.0* 3.1* 3.1* 3.0* 2.9*  -Continue to Monitor and Trend and repeat CMP in the AM   DVT prophylaxis: SCDs Start: 08/24/22 1946    Code Status: Full Code Family Communication: No family  present at bedside  Disposition Plan:  Level of care: Med-Surg Status is: Inpatient Remains inpatient appropriate because: Needs further clinical inpatient workup and Clearance by GI   Consultants:  Gastroenterology   Procedures:  As delineated as above  EGD Findings:      No gross lesions were noted in the proximal esophagus and in the mid       esophagus.      Small grade I varices were found in the distal esophagus.      The Z-line was irregular and was found 37 cm from the incisors.      A 4 cm hiatal hernia was present.      Mild portal hypertensive gastropathy was found in the cardia, in the       gastric fundus and in the gastric body.      Patchy mildly erythematous mucosa without bleeding was found in the       entire examined stomach. Biopsies were taken with a cold forceps for        histology and Helicobacter pylori testing.      Moderate, diffuse gastric antral vascular ectasia with some small areas       of clot that when removed showed active oozing were present in the       gastric antrum. Focal radiofrequency ablation of gastric antral vascular       ectasia in the stomach was performed. With the endoscope in place, the       position and extent of the abnormal mucosa and appropriate anatomic       landmarks were noted. The abnormal mucosa was irrigated with water.       Gastric contents were suctioned. The endoscope was then removed from the       patient. The Barrx-90 radiofrequency ablation catheter was attached to       the tip of the endoscope. The endoscope with the attached radiofrequency       ablation catheter was then passed under direct vision and advanced to       the areas of abnormal mucosa. The radiofrequency ablation catheter was       placed in contact with the surface of the abnormal mucosa under direct       visualization and energy was applied twice at 12 J/cm2. Ablation was       repeated in a likewise fashion to all visible abnormal mucosa. The areas       where abnormal mucosa had been ablated were examined. Areas of abnormal       mucosa appeared completely ablated. Whitish changes of ablated mucosa       were present. The total number of energy applications for all mucosal       sites treated was 62. There was no evidence for perforation.      No gross lesions were noted in the duodenal bulb, in the first portion       of the duodenum and in the second portion of the duodenum.      Using the endoscope, the video capsule enteroscope was advanced into the       duodenal bulb and released. Impression:               - No gross lesions in the proximal esophagus and in                            the mid esophagus.                           -  Small grade I esophageal varices noted distally.                           - Z-line irregular, 37 cm  from the incisors.                           - 4 cm hiatal hernia.                           - Portal hypertensive gastropathy proximally.                           - Erythematous mucosa in the stomach. Biopsied.                           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 Treated with                            radiofrequency ablation.                           - No gross lesions in the duodenal bulb, in the                            first portion of the duodenum and in the second                            portion of the duodenum.                           - Successful completion of the Video Capsule                            Enteroscope placement into the bulb. Moderate Sedation:      Not Applicable - Patient had care per Anesthesia. Recommendation:           - The patient will be observed post-procedure,                            until all discharge criteria are met.                           - Return patient to hospital ward for ongoing care.                           - Patient has a contact number available for                            emergencies. The signs and symptoms of potential  delayed complications were discussed with the                            patient. Return to normal activities tomorrow.                            Written discharge instructions were provided to the                            patient.                           - Diet as per VCE protocol (see RN notes for                            details).                           - PPI 40 mg twice daily.                           - Carafate 1 g QID x 4-weeks and then BID.                           - Ciprofloxacin 500 mg BID x 3-days to decrease                            post-interventional infectious risk.                           - Followup VCE pickup tomorrow.                           - Repeat EGD in  62-months for repeat evaluation of                            GAVE is reasonable and can be pursued by primary GI                            if any further treatment may be necessary.                           - If anticoagulation to restart, then would give at                            least 72 hours (8/14) before restart. But this is                            also to be determined based on findings of the VCE.                           - The findings and recommendations were discussed  with the patient.                           - The findings and recommendations were discussed                            with the patient's family.                           - The findings and recommendations were discussed                            with the referring physician.  Antimicrobials:  Anti-infectives (From admission, onward)    Start     Dose/Rate Route Frequency Ordered Stop   08/27/22 0800  ciprofloxacin (CIPRO) tablet 250 mg        250 mg Oral 2 times daily 08/26/22 1611 08/30/22 0759   08/26/22 2000  ciprofloxacin (CIPRO) tablet 500 mg  Status:  Discontinued        500 mg Oral 2 times daily 08/26/22 1602 08/26/22 1610   08/26/22 1200  ciprofloxacin (CIPRO) IVPB 400 mg  Status:  Discontinued        400 mg 200 mL/hr over 60 Minutes Intravenous Every 24 hours 08/26/22 1117 08/26/22 1611   08/26/22 1115  ciprofloxacin (CIPRO) IVPB 400 mg  Status:  Discontinued        400 mg 200 mL/hr over 60 Minutes Intravenous Every 12 hours 08/26/22 1106 08/26/22 1117       Subjective: Seen and examined at bedside he come back from his EGD and he is feeling hungry.  No nausea or vomiting.  Thinks he is doing okay states his pain is better and his nausea is better.  No other concerns or complaints at this time.  Objective: Vitals:   08/26/22 1205 08/26/22 1206 08/26/22 1258 08/26/22 1318  BP: 139/67   (!) 119/56  Pulse: 60  87 (!) 59  Resp: 10  18 18   Temp:   97.7 F  (36.5 C) 98.1 F (36.7 C)  TempSrc:  Oral Oral Oral  SpO2: 100%  100% 98%  Weight:      Height:        Intake/Output Summary (Last 24 hours) at 08/26/2022 1854 Last data filed at 08/26/2022 1700 Gross per 24 hour  Intake 640 ml  Output --  Net 640 ml   Filed Weights   08/24/22 1456 08/26/22 0500 08/26/22 1025  Weight: 90 kg 90.1 kg 90.1 kg   Examination: Physical Exam:  Constitutional: WN/WD elderly Caucasian male in no acute distress appear Respiratory: Diminished to auscultation bilaterally, no wheezing, rales, rhonchi or crackles. Normal respiratory effort and patient is not tachypenic. No accessory muscle use.  Unlabored breathing Cardiovascular: RRR, no murmurs / rubs / gallops. S1 and S2 auscultated.  Has 1+ lower extremity edema Abdomen: Soft, non-tender, distended secondary to body habitus. Bowel sounds positive.  GU: Deferred. Musculoskeletal: No clubbing / cyanosis of digits/nails. No joint deformity upper and lower extremities. Skin: No rashes, lesions, ulcers on limited skin evaluation. No induration; Warm and dry.  Neurologic: CN 2-12 grossly intact with no focal deficits. Romberg sign and cerebellar reflexes not assessed.  Psychiatric: Normal judgment and insight. Alert and oriented x 3. Normal mood and appropriate affect.   Data Reviewed: I have personally reviewed following  labs and imaging studies  CBC: Recent Labs  Lab 08/24/22 1543 08/25/22 0838 08/26/22 0530  WBC 9.5 8.6 8.6  NEUTROABS 6.7 6.1 6.1  HGB 7.2* 9.4* 8.7*  HCT 25.0* 31.9* 29.2*  MCV 85.0 85.5 85.9  PLT 230 243 229   Basic Metabolic Panel: Recent Labs  Lab 08/24/22 1543 08/24/22 1653 08/25/22 0838 08/26/22 0530  NA 132*  --  131* 134*  K 5.3* 4.7 4.6 4.7  CL 99  --  100 101  CO2 22  --  19* 21*  GLUCOSE 111*  --  159* 62*  BUN 71*  --  71* 69*  CREATININE 3.69*  --  3.75* 3.61*  CALCIUM 8.6*  --  8.1* 8.1*  MG  --   --  2.2 2.4  PHOS  --   --  4.9* 4.3   GFR: Estimated  Creatinine Clearance: 18.4 mL/min (A) (by C-G formula based on SCr of 3.61 mg/dL (H)). Liver Function Tests: Recent Labs  Lab 08/24/22 1543 08/25/22 0838 08/26/22 0530  AST 21 23 18   ALT 16 16 14   ALKPHOS 151* 137* 132*  BILITOT 1.0 1.4* 1.4*  PROT 7.9 7.2 7.3  ALBUMIN 3.1* 3.0* 2.9*   No results for input(s): "LIPASE", "AMYLASE" in the last 168 hours. No results for input(s): "AMMONIA" in the last 168 hours. Coagulation Profile: No results for input(s): "INR", "PROTIME" in the last 168 hours. Cardiac Enzymes: No results for input(s): "CKTOTAL", "CKMB", "CKMBINDEX", "TROPONINI" in the last 168 hours. BNP (last 3 results) No results for input(s): "PROBNP" in the last 8760 hours. HbA1C: No results for input(s): "HGBA1C" in the last 72 hours. CBG: Recent Labs  Lab 08/25/22 1628 08/25/22 2002 08/26/22 0729 08/26/22 1321 08/26/22 1647  GLUCAP 166* 132* 103* 101* 144*   Lipid Profile: No results for input(s): "CHOL", "HDL", "LDLCALC", "TRIG", "CHOLHDL", "LDLDIRECT" in the last 72 hours. Thyroid Function Tests: No results for input(s): "TSH", "T4TOTAL", "FREET4", "T3FREE", "THYROIDAB" in the last 72 hours. Anemia Panel: No results for input(s): "VITAMINB12", "FOLATE", "FERRITIN", "TIBC", "IRON", "RETICCTPCT" in the last 72 hours. Sepsis Labs: No results for input(s): "PROCALCITON", "LATICACIDVEN" in the last 168 hours.  Recent Results (from the past 240 hour(s))  Urine Culture     Status: None   Collection Time: 08/18/22  3:59 PM   Specimen: Urine, Clean Catch  Result Value Ref Range Status   Specimen Description   Final    URINE, CLEAN CATCH Performed at [facility], [address], [place], [place] [address]    Special Requests   Final    NONE Performed at [facility], [address] [place], [place] [address]    Culture   Final    NO GROWTH Performed at [facility], [address] [place], [place] [address]     Report Status 08/19/2022 FINAL  Final  Resp panel by RT-PCR (RSV, Flu A&B, Covid) Urine, Clean Catch     Status: None   Collection Time: 08/18/22  3:59 PM   Specimen: Urine, Clean Catch; Nasal Swab  Result Value Ref Range Status   SARS Coronavirus 2 by RT PCR NEGATIVE NEGATIVE Final    Comment: (NOTE) SARS-CoV-2 target nucleic acids are NOT DETECTED.  The SARS-CoV-2 RNA is generally detectable in upper respiratory specimens during the acute phase of infection. The lowest concentration of SARS-CoV-2 viral copies this assay can detect is 138 copies/mL. A negative result does not preclude SARS-Cov-2 infection and should not  be used as the sole basis for treatment or other patient management decisions. A negative result may occur with  improper specimen collection/handling, submission of specimen other than nasopharyngeal swab, presence of viral mutation(s) within the areas targeted by this assay, and inadequate number of viral copies(<138 copies/mL). A negative result must be combined with clinical observations, patient history, and epidemiological information. The expected result is Negative.  Fact Sheet for Patients:  BloggerCourse.com  Fact Sheet for Healthcare Providers:  SeriousBroker.it  This test is no t yet approved or cleared by the [place] FDA and  has been authorized for detection and/or diagnosis of SARS-CoV-2 by FDA under an Emergency Use Authorization (EUA). This EUA will remain  in effect (meaning this test can be used) for the duration of the COVID-19 declaration under Section 564(b)(1) of the Act, 21 U.S.C.section 360bbb-3(b)(1), unless the authorization is terminated  or revoked sooner.       Influenza A by PCR NEGATIVE NEGATIVE Final   Influenza B by PCR NEGATIVE NEGATIVE Final    Comment: (NOTE) The Xpert Xpress SARS-CoV-2/FLU/RSV plus assay is intended as an aid in the diagnosis of influenza from  Nasopharyngeal swab specimens and should not be used as a sole basis for treatment. Nasal washings and aspirates are unacceptable for Xpert Xpress SARS-CoV-2/FLU/RSV testing.  Fact Sheet for Patients: BloggerCourse.com  Fact Sheet for Healthcare Providers: SeriousBroker.it  This test is not yet approved or cleared by the [place] FDA and has been authorized for detection and/or diagnosis of SARS-CoV-2 by FDA under an Emergency Use Authorization (EUA). This EUA will remain in effect (meaning this test can be used) for the duration of the COVID-19 declaration under Section 564(b)(1) of the Act, 21 U.S.C. section 360bbb-3(b)(1), unless the authorization is terminated or revoked.     Resp Syncytial Virus by PCR NEGATIVE NEGATIVE Final    Comment: (NOTE) Fact Sheet for Patients: BloggerCourse.com  Fact Sheet for Healthcare Providers: SeriousBroker.it  This test is not yet approved or cleared by the [place] FDA and has been authorized for detection and/or diagnosis of SARS-CoV-2 by FDA under an Emergency Use Authorization (EUA). This EUA will remain in effect (meaning this test can be used) for the duration of the COVID-19 declaration under Section 564(b)(1) of the Act, 21 U.S.C. section 360bbb-3(b)(1), unless the authorization is terminated or revoked.  Performed at [facility], [address] [place], [place] [address]     Radiology Studies: No results found.  Scheduled Meds:  allopurinol  100 mg Oral Daily   carvedilol  6.25 mg Oral BID   [START ON 08/27/2022] ciprofloxacin  250 mg Oral BID   furosemide  40 mg Oral QPM   furosemide  60 mg Oral Q breakfast   insulin aspart  0-5 Units Subcutaneous QHS   insulin aspart  0-9 Units Subcutaneous TID WC   insulin glargine-yfgn  40 Units Subcutaneous Daily   isosorbide mononitrate  30 mg Oral Daily    levothyroxine  25 mcg Oral Q0600   pantoprazole  40 mg Oral BID   sucralfate  1 g Oral TID WC & HS   Continuous Infusions:   LOS: 1 day   Marguerita Merles, DO [facility] Available via Epic secure chat 7am-7pm After these hours, please refer to coverage provider listed on [email or web address] 08/26/2022, 6:54 PM

## 2022-08-26 NOTE — Interval H&P Note (Signed)
History and Physical Interval Note:  08/26/2022 7:10 AM  Edward [name]  has presented today for surgery, with the diagnosis of Acute on chronic anemia; heme positive stool, acute blood loss anemia.  The various methods of treatment have been discussed with the patient and family. After consideration of risks, benefits and other options for treatment, the patient has consented to  Procedure(s): ESOPHAGOGASTRODUODENOSCOPY (EGD) (N/A) GIVENS CAPSULE STUDY (N/A) vs SBE +/- VCE as a surgical intervention.  The patient's history has been reviewed, patient examined, no change in status, stable for surgery.  I have reviewed the patient's chart and labs.  Questions were answered to the patient's satisfaction.     [employer]

## 2022-08-26 NOTE — Progress Notes (Signed)
PHARMACY NOTE:  ANTIMICROBIAL RENAL DOSAGE ADJUSTMENT  Current antimicrobial regimen includes a mismatch between antimicrobial dosage and estimated renal function.  As per policy approved by the Pharmacy & Therapeutics and Medical Executive Committees, the antimicrobial dosage will be adjusted accordingly.  Current antimicrobial dosage:  Cipro 400 mg IV q12h  Indication: Surgical prophylaxis  Renal Function:  Estimated Creatinine Clearance: 17.7 mL/min (A) (by C-G formula based on SCr of 3.75 mg/dL (H)). []      On intermittent HD, scheduled: []      On CRRT    Antimicrobial dosage has been changed to:  Cipro 400 mg IV q24h  Additional comments:   Thank you for allowing pharmacy to be a part of this patient's care.  Lynden Ang, [facility] 08/26/2022 11:19 AM

## 2022-08-26 NOTE — Transfer of Care (Signed)
Immediate Anesthesia Transfer of Care Note  Patient: Edward [name]  Procedure(s) Performed: ESOPHAGOGASTRODUODENOSCOPY (EGD) GI RADIOFREQUENCY ABLATION BIOPSY GIVENS CAPSULE STUDY  Patient Location: Endoscopy Unit  Anesthesia Type:MAC  Level of Consciousness: drowsy and patient cooperative  Airway & Oxygen Therapy: Patient Spontanous Breathing and Patient connected to face mask oxygen  Post-op Assessment: Report given to RN and Post -op Vital signs reviewed and stable  Post vital signs: Reviewed and stable  Last Vitals:  Vitals Value Taken Time  BP 105/55 08/26/22 1112  Temp    Pulse 62 08/26/22 1114  Resp 14 08/26/22 1114  SpO2 100 % 08/26/22 1114  Vitals shown include unfiled device data.  Last Pain:  Vitals:   08/26/22 1025  TempSrc: Tympanic  PainSc: 0-No pain         Complications: No notable events documented.

## 2022-08-26 NOTE — Anesthesia Postprocedure Evaluation (Signed)
Anesthesia Post Note  Patient: Kamori Orebaugh Tayag  Procedure(s) Performed: ESOPHAGOGASTRODUODENOSCOPY (EGD) GI RADIOFREQUENCY ABLATION BIOPSY GIVENS CAPSULE STUDY     Patient location during evaluation: PACU Anesthesia Type: MAC Level of consciousness: awake and alert Pain management: pain level controlled Vital Signs Assessment: post-procedure vital signs reviewed and stable Respiratory status: spontaneous breathing Cardiovascular status: stable Anesthetic complications: no   No notable events documented.  Last Vitals:  Vitals:   08/26/22 1140 08/26/22 1145  BP: 116/67   Pulse: 61 (!) 59  Resp: 15 12  Temp:    SpO2: 100% 100%    Last Pain:  Vitals:   08/26/22 1130  TempSrc:   PainSc: 0-No pain                 Lewie Loron

## 2022-08-26 NOTE — Progress Notes (Signed)
Patient states he is feeling better with no discomfort or nausea at this time. Had an ice chip and feels better. Report given to floor nurse and patient to go back to floor. May have clear liquids at 1300

## 2022-08-27 DIAGNOSIS — I5032 Chronic diastolic (congestive) heart failure: Secondary | ICD-10-CM | POA: Diagnosis not present

## 2022-08-27 DIAGNOSIS — E875 Hyperkalemia: Secondary | ICD-10-CM

## 2022-08-27 DIAGNOSIS — N184 Chronic kidney disease, stage 4 (severe): Secondary | ICD-10-CM | POA: Diagnosis not present

## 2022-08-27 DIAGNOSIS — D649 Anemia, unspecified: Secondary | ICD-10-CM | POA: Diagnosis not present

## 2022-08-27 DIAGNOSIS — K7460 Unspecified cirrhosis of liver: Secondary | ICD-10-CM | POA: Diagnosis not present

## 2022-08-27 DIAGNOSIS — N179 Acute kidney failure, unspecified: Secondary | ICD-10-CM | POA: Diagnosis not present

## 2022-08-27 DIAGNOSIS — R195 Other fecal abnormalities: Secondary | ICD-10-CM | POA: Diagnosis not present

## 2022-08-27 LAB — COMPREHENSIVE METABOLIC PANEL WITH GFR
ALT: 13 U/L (ref 0–44)
AST: 20 U/L (ref 15–41)
Albumin: 2.8 g/dL — ABNORMAL LOW (ref 3.5–5.0)
Alkaline Phosphatase: 133 U/L — ABNORMAL HIGH (ref 38–126)
Anion gap: 11 (ref 5–15)
BUN: 70 mg/dL — ABNORMAL HIGH (ref 8–23)
CO2: 21 mmol/L — ABNORMAL LOW (ref 22–32)
Calcium: 8.4 mg/dL — ABNORMAL LOW (ref 8.9–10.3)
Chloride: 100 mmol/L (ref 98–111)
Creatinine, Ser: 3.62 mg/dL — ABNORMAL HIGH (ref 0.61–1.24)
GFR, Estimated: 16 mL/min — ABNORMAL LOW
Glucose, Bld: 90 mg/dL (ref 70–99)
Potassium: 4.5 mmol/L (ref 3.5–5.1)
Sodium: 132 mmol/L — ABNORMAL LOW (ref 135–145)
Total Bilirubin: 1.7 mg/dL — ABNORMAL HIGH (ref 0.3–1.2)
Total Protein: 7.1 g/dL (ref 6.5–8.1)

## 2022-08-27 LAB — GLUCOSE, CAPILLARY
Glucose-Capillary: 87 mg/dL (ref 70–99)
Glucose-Capillary: 103 mg/dL — ABNORMAL HIGH (ref 70–99)
Glucose-Capillary: 118 mg/dL — ABNORMAL HIGH (ref 70–99)
Glucose-Capillary: 170 mg/dL — ABNORMAL HIGH (ref 70–99)

## 2022-08-27 LAB — CBC WITH DIFFERENTIAL/PLATELET
Abs Immature Granulocytes: 0.06 10*3/uL (ref 0.00–0.07)
Basophils Absolute: 0.1 10*3/uL (ref 0.0–0.1)
Basophils Relative: 1 %
Eosinophils Absolute: 0.3 10*3/uL (ref 0.0–0.5)
Eosinophils Relative: 3 %
HCT: 30.5 % — ABNORMAL LOW (ref 39.0–52.0)
Hemoglobin: 9.1 g/dL — ABNORMAL LOW (ref 13.0–17.0)
Immature Granulocytes: 1 %
Lymphocytes Relative: 6 %
Lymphs Abs: 0.6 10*3/uL — ABNORMAL LOW (ref 0.7–4.0)
MCH: 25.6 pg — ABNORMAL LOW (ref 26.0–34.0)
MCHC: 29.8 g/dL — ABNORMAL LOW (ref 30.0–36.0)
MCV: 85.7 fL (ref 80.0–100.0)
Monocytes Absolute: 1.4 10*3/uL — ABNORMAL HIGH (ref 0.1–1.0)
Monocytes Relative: 15 %
Neutro Abs: 7.3 10*3/uL (ref 1.7–7.7)
Neutrophils Relative %: 74 %
Platelets: 243 10*3/uL (ref 150–400)
RBC: 3.56 MIL/uL — ABNORMAL LOW (ref 4.22–5.81)
RDW: 18.5 % — ABNORMAL HIGH (ref 11.5–15.5)
WBC: 9.8 10*3/uL (ref 4.0–10.5)
nRBC: 0 % (ref 0.0–0.2)

## 2022-08-27 LAB — MAGNESIUM: Magnesium: 2.2 mg/dL (ref 1.7–2.4)

## 2022-08-27 MED ORDER — PROCHLORPERAZINE EDISYLATE 10 MG/2ML IJ SOLN
5.0000 mg | Freq: Once | INTRAMUSCULAR | Status: AC | PRN
  Administered 2022-08-27: 5 mg via INTRAVENOUS
  Filled 2022-08-27: qty 2

## 2022-08-27 NOTE — Telephone Encounter (Signed)
Pts wife calling wanting to know if Dr. Chales Abrahams had read the capsule endo that pt had done. Let he know I would send Dr. Chales Abrahams letting him know he had the capsule placed inpt. They are anxious to know the results.

## 2022-08-27 NOTE — Telephone Encounter (Signed)
Inbound call from patient wife requesting to speak about the capsul endo. Please advise.   Thank you

## 2022-08-27 NOTE — Progress Notes (Addendum)
[facility] Progress Note  CC:  Acute on chronic anemia   Subjective: He is sitting up at the bedside eating breakfast.  He had some nausea last night which abated after he received Zofran.  No vomiting.  No abdominal pain.  No bowel movement, melena or rectal bleeding overnight or thus far this morning.  No chest pain or shortness of breath.  Wife at the bedside.   Objective:   EGD 08/26/2022: - No gross lesions in the proximal esophagus and in the mid esophagus.  - Small grade I esophageal varices noted distally.  - Z-line irregular, 37 cm from the incisors.  - 4 cm hiatal hernia.  - Portal hypertensive gastropathy proximally.  - Erythematous mucosa in the stomach. Biopsied.  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 Treated with radiofrequency ablation.  - No gross lesions in the duodenal bulb, in the first portion of the duodenum and in the second portion of the duodenum.  - Successful completion of the Video Capsule Enteroscope placement into the bulb.   Vital signs in last 24 hours: Temp:  [97.4 F (36.3 C)-98.1 F (36.7 C)] 97.8 F (36.6 C) (08/12 0601) Pulse Rate:  [59-87] 61 (08/12 0601) Resp:  [9-20] 19 (08/12 0601) BP: (105-147)/(43-67) 108/43 (08/12 0601) SpO2:  [97 %-100 %] 97 % (08/12 0601) Weight:  [90.1 kg] 90.1 kg (08/11 1025) Last BM Date : 08/25/22 General: Alert 82 year old male in no acute distress. Heart: Slightly irregular rhythm, no murmur. Pulm: Breath sounds clear throughout. Abdomen: Soft, nondistended.  Nontender.  Questionable small amount of ascites, abdomen is not tense.  Positive bowel sounds to all 4 quadrants.  No hepatosplenomegaly.  No bruit. Extremities: Mild bilateral lower extremity edema with stasis dermatitis. Neurologic:  Alert and  oriented x 4. Grossly normal neurologically. Psych:  Alert and cooperative. Normal mood and affect.  Intake/Output from previous day: 08/11  0701 - 08/12 0700 In: 690 [P.O.:490; I.V.:200] Out: -  Intake/Output this shift: No intake/output data recorded.  Lab Results: Recent Labs    08/25/22 0838 08/26/22 0530 08/27/22 0529  WBC 8.6 8.6 9.8  HGB 9.4* 8.7* 9.1*  HCT 31.9* 29.2* 30.5*  PLT 243 229 243   BMET Recent Labs    08/25/22 0838 08/26/22 0530 08/27/22 0529  NA 131* 134* 132*  K 4.6 4.7 4.5  CL 100 101 100  CO2 19* 21* 21*  GLUCOSE 159* 62* 90  BUN 71* 69* 70*  CREATININE 3.75* 3.61* 3.62*  CALCIUM 8.1* 8.1* 8.4*   LFT Recent Labs    08/27/22 0529  PROT 7.1  ALBUMIN 2.8*  AST 20  ALT 13  ALKPHOS 133*  BILITOT 1.7*   PT/INR No results for input(s): "LABPROT", "INR" in the last 72 hours. Hepatitis Panel No results for input(s): "HEPBSAG", "HCVAB", "HEPAIGM", "HEPBIGM" in the last 72 hours.  No results found.  Patient Profile:  Patient is an 82 y.o. 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   Assessment / Plan:   Acute on chronic anemia with positive FOBT. Admission Hg  7.2 ( Hg 9.1 on 05/26/2022). Transfused 2 units of PRBCs 8/9 - 8/10 -> post transfusion Hg 9.4 -> 8.7 -> today Hg 9.1.  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  VCE placed, results pending. Prior EGD March 2023 showed gastritis with biopsies showing slight chronic inflammation and reactive changes, no H. pylori. Colonoscopy March 2023 showed  2 polyps and diverticulosis.  -Await VCE results  -Continue Pantoprazole 40 mg p.o. twice daily -Continue Carafate 1 g p.o. 4 times daily for 4 weeks then twice daily -Continue Cipro 500 mg 1 tab p.o. twice daily for a total of 3 days to decrease post interventional infectious risk -Repeat EGD in 2 months as an outpatient to reassess GAVE -Anticoagulation for a total of 72 hours post  EGD -Heart healthy/2gm low sodium diet  AKI on CKD stage IV. Cr 3.61 -> 3.62.  Chronic diastolic CHF, LV EF 55 - 60%, moderately reduced RV function per ECHO 03/21/2022. On Lasix 40mg  po every day.   Paroxysmal atrial fibrillation, Eliquis on hold secondary to GI bleed.  On Carvedilol.  Diabetes mellitus type 2  Hyponatremia. Na+ 132.   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 Albumin 2.8. T. Bili 1.7. Alk phos 133. AST 20. ALT 13. EGD 8/11 for grade 1 esophageal varices and portal hypertensive gastropathy. -INR in am -Follow up with GI at the [facility] [place] [facility] [facility]      Principal Problem:   Symptomatic anemia Active Problems:   PAF (paroxysmal atrial fibrillation) (HCC)   Diabetes mellitus (HCC)   Hyperlipidemia   Pacemaker-[employer]   Chronic diastolic CHF (congestive heart failure) (HCC)   CKD (chronic kidney disease) stage 4, GFR 15-29 ml/min (HCC)   Benign essential hypertension   Acute renal failure superimposed on stage 4 chronic kidney disease (HCC)   Type 2 diabetes mellitus with hyperglycemia (HCC)   GI bleed     LOS: 2 days   Arnaldo Natal  08/27/2022, 9:34AM   Attending physician's note   I have taken history, reviewed the chart and examined the patient. I performed a substantive portion of this encounter, including complete performance of at least one of the key components, in conjunction with the APP. I agree with the Advanced Practitioner's note, impression and recommendations.   VCE: No bleeding SB lesions.  Study to some  extent limited due to retained small bowel secretions.  Plan as per EGD note. Avoid nonsteroidals Will sign off for now Pl call with any ?   Edman Circle, MD [facility] [phone]

## 2022-08-27 NOTE — Plan of Care (Signed)

## 2022-08-27 NOTE — Progress Notes (Signed)
PROGRESS NOTE    Edward [name]  XFG:[id number] DOB: 1940/02/18 DOA: 08/24/2022 PCP: Byrd Hesselbach, PA   Brief Narrative:  The patient is an 82 year old Caucasian male with a past medical history significant for volume to chronic diastolic CHF, chronic kidney disease stage IV, history of proximal atrial fibrillation on anticoagulation which was recently held, history of recurrent GI bleeding, history of cirrhosis of the liver, history of chronic anemia, history of permanent pacemaker as well as other comorbidities who presented to the ED yesterday for worsening fatigue and lethargy.  He was recently admitted to the hospital last week for an AKI was given IV fluids and his diuretics were held and he was also given a unit of blood.  He had intermittent blood in his stools and he was currently undergoing outpatient GI workup and had a colonoscopy and upper endoscopy last year.  He is scheduled for a capsule endoscopy on Monday but he continued have increasing fatigue at home and wife noticed that his blood count had dropped on labs so she brought him into the ED for further evaluation.  He was typed and screened and transfused 2 units of PRBCs.  Renal function is started to worsen so IV fluid hydration has now held given that he also received blood and will start him back on diuresis.  GI was consulted for further evaluation and they are planning EGD with video capsule endoscopy to be placed.  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  Patient was treated with radiofrequency ablation and GI recommending Diet advancement per VCE protocol.  VCE was read and showed no bleeding small bowel lesions and did have some limitations though due to small bowel secretions.  GI has now signed off the case and recommending current plan and anticipating discharging in the next 24 hours.  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  -Getting ready for capsule endoscopy next week by Dr. Rhea Belton but I consulted GI Inpatient and they evaluated and planning for a EGD with video capsule endoscopy to be placed as well in the a.m. and also start him on mild bowel regimen with bisacodyl 10 mg -Hgb/Hct Trend: Recent Labs  Lab 08/18/22 1422 08/19/22 0746 08/24/22 1543 08/25/22 0838 08/26/22 0530 08/27/22 0529  HGB 7.2* 7.8* 7.2* 9.4* 8.7* 9.1*  HCT 25.2* 27.5* 25.0* 31.9* 29.2* 30.5*  MCV 86.6 86.8 85.0 85.5 85.9 85.7  -Continue to Monitor for S/Sx of Bleeding;  -EGD done and showed " Portal hypertensive gastropathy proximally. Erythematous mucosa in the stomach. 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 Treated with   radiofrequency ablation. No gross lesions in the duodenal bulb, in the    first portion of the duodenum and in the second portion of the duodenum. Successful completion of the Video Capsule Enteroscope placement into the bulb." -Repeat CBC in the AM  -GI recommending advancing the diet per the VCE protocol and continuing PPI 40 mg p.o. twice daily and adding Carafate 1 g 4 times daily for 4 weeks and then twice daily. -They are initiating patient on ciprofloxacin 500 g twice daily for 3 days to decrease the post interventional infectious risk and repeating EGD in 2 months for repeat evaluation of GAVE and following on the VCE tomorrow. -GI feels that the patient can be  restarted on anticoagulation 08/29/2022 but needs to be determined on the findings of VCE -VCE being read by Gastroenterology and showed no small bowel bleeding lesions.  The study was limited due to retained small bowel secretions and GI feels further care per the EGD note with resumption of anticoagulation on 08/29/2022.  Will anticipate discharging next 24 hours  Benign Essential Hypertension -Stable.  -Continue with Imdur and Coreg.   -Initially held Lasix and Aldactone due to AKI but now just resumed the Lasix and continuing to hold the Aldactone -Continue monitor blood pressures per protocol -Last blood pressure reading was 119/56   AKI on CKD (chronic kidney disease) stage 4, GFR 15-29 ml/min (HCC) Metabolic Acidosis, improved  -Acutely worse due to anemia and continued use of Lasix/Aldacone at home -Stopped IVF due to Hx of Anasarca and Diastolic CHF -Follows with nephrology at the [facility].  -Pt states he was told he is not ready for HD yet. -Patient had a slight metabolic acidosis with a CO2 of 19, anion gap of 12, Chloride Level of 100; Now CO2 is 21, AG is 12, and Chloride Level is 101 -BUN/Cr Trend: Recent Labs  Lab 08/18/22 1422 08/19/22 0746 08/24/22 1543 08/25/22 0838 08/26/22 0530 08/27/22 0529  BUN 61* 59* 71* 71* 69* 70*  CREATININE 3.01* 2.67* 3.69* 3.75* 3.61* 3.62*  -Given IVF and Blood but appears Volume overloaded a little so will resume Home Diuretics with just Furosemide 60 mg in the AM and 40 mg po qHS -Avoid Nephrotoxic Medications, Contrast Dyes, Hypotension and Dehydration to Ensure Adequate Renal Perfusion and will need to Renally Adjust Meds -Continue to Monitor and Trend Renal Function carefully and repeat CMP in the AM   Chronic Diastolic CHF (congestive heart failure) (HCC) Was Holding lasix, aldactone due to AKI but renal function worsened after and he is a little volume overloaded.  Now will resume his home Lasix of 60 mg in the a.m. and 40 mg in the evening -Continue with coreg, imdur. -Strict I's and O's and Daily Weights  Intake/Output Summary (Last 24 hours) at 08/27/2022 1815 Last data filed at 08/27/2022 1300 Gross per 24 hour  Intake 970 ml  Output --  Net 970 ml  -Continue to Monitor for S/Sx of Volume Overload -Obtain CXR in the AM   Hyponatremia -Na+ Trend Recent Labs  Lab 08/18/22 1422 08/19/22 0746 08/24/22 1543 08/25/22 0838 08/26/22 0530  08/27/22 0529  NA 138 136 132* 131* 134* 132*  -Resume Lasix and continue to Monitor  -Continue monitor and trend and repeat CMP in a.m.  Pacemaker-[employer]tr[employer] -Chronic.   Hyperlipidemia -Stable.   Diabetes Mellitus Type 2 with Hyperglycemia -Stable.  -Continue with Semglee 40 units subcu daily and now sensitive NovoLog sign scale insulin before meals and at bedtime has been added -Continue monitor blood sugars per protocol -CBG Trend: Recent Labs  Lab 08/26/22 0729 08/26/22 1321 08/26/22 1647 08/26/22 2136 08/27/22 0801 08/27/22 1203 08/27/22 1630  GLUCAP 103* 101* 144* 176* 87 103* 118*  -Glucose Trend: Recent Labs  Lab 08/18/22 1422 08/19/22 0746 08/24/22 1543 08/25/22 0838 08/26/22 0530 08/27/22 0529  GLUCOSE 145* 125* 111* 159* 62* 90    PAF (paroxysmal atrial fibrillation) (HCC) -Stable.  -Has PPM.  -Off Eliquis this week due to intermittent GI bleeding. -Continue with beta-blocker with carvedilol 6.25 mg p.o. twice daily -Monitor on telemetry if needed  Hypothyroidism -Continue Levothyroxine 25 mcg p.o. daily and check TSH in the a.m.   GERD/GI Prophylaxis -C/w Pantoprazole  40 mg po Daily  Hyperbilirubinemia -Likely Reactive -Bilirubin Trend: Recent Labs  Lab 08/18/22 1422 08/19/22 0746 08/24/22 1543 08/25/22 0838 08/26/22 0530 08/27/22 0529  BILITOT 1.0 1.3* 1.0 1.4* 1.4* 1.7*  -Continue to Monitor and Trend and repeat CMP in the AM   Hypoalbuminemia -Patient's Albumin Trend: Recent Labs  Lab 08/18/22 1422 08/19/22 0746 08/24/22 1543 08/25/22 0838 08/26/22 0530 08/27/22 0529  ALBUMIN 3.0* 3.1* 3.1* 3.0* 2.9* 2.8*  -Continue to Monitor and Trend and repeat CMP in the AM   DVT prophylaxis: SCDs Start: 08/24/22 1946    Code Status: Full Code Family Communication: Discussed with wife at bedside  Disposition Plan:  Level of care: Med-Surg Status is: Inpatient Remains inpatient appropriate because: Will monitor over night to make  sure he is stable and D/C in the AM   Consultants:  Gastroenterology   Procedures:  EGD Findings:      No gross lesions were noted in the proximal esophagus and in the mid       esophagus.      Small grade I varices were found in the distal esophagus.      The Z-line was irregular and was found 37 cm from the incisors.      A 4 cm hiatal hernia was present.      Mild portal hypertensive gastropathy was found in the cardia, in the       gastric fundus and in the gastric body.      Patchy mildly erythematous mucosa without bleeding was found in the       entire examined stomach. Biopsies were taken with a cold forceps for       histology and Helicobacter pylori testing.      Moderate, diffuse gastric antral vascular ectasia with some small areas       of clot that when removed showed active oozing were present in the       gastric antrum. Focal radiofrequency ablation of gastric antral vascular       ectasia in the stomach was performed. With the endoscope in place, the       position and extent of the abnormal mucosa and appropriate anatomic       landmarks were noted. The abnormal mucosa was irrigated with water.       Gastric contents were suctioned. The endoscope was then removed from the       patient. The Barrx-90 radiofrequency ablation catheter was attached to       the tip of the endoscope. The endoscope with the attached radiofrequency       ablation catheter was then passed under direct vision and advanced to       the areas of abnormal mucosa. The radiofrequency ablation catheter was       placed in contact with the surface of the abnormal mucosa under direct       visualization and energy was applied twice at 12 J/cm2. Ablation was       repeated in a likewise fashion to all visible abnormal mucosa. The areas       where abnormal mucosa had been ablated were examined. Areas of abnormal       mucosa appeared completely ablated. Whitish changes of ablated mucosa       were  present. The total number of energy applications for all mucosal       sites treated was 62. There was no evidence for perforation.      No gross lesions  were noted in the duodenal bulb, in the first portion       of the duodenum and in the second portion of the duodenum.      Using the endoscope, the video capsule enteroscope was advanced into the       duodenal bulb and released. Impression:               - No gross lesions in the proximal esophagus and in                            the mid esophagus.                           - Small grade I esophageal varices noted distally.                           - Z-line irregular, 37 cm from the incisors.                           - 4 cm hiatal hernia.                           - Portal hypertensive gastropathy proximally.                           - Erythematous mucosa in the stomach. Biopsied.                           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 Treated with                            radiofrequency ablation.                           - No gross lesions in the duodenal bulb, in the                            first portion of the duodenum and in the second                            portion of the duodenum.                           - Successful completion of the Video Capsule                            Enteroscope placement into the bulb. Moderate Sedation:      Not Applicable - Patient had care per Anesthesia. Recommendation:           - The patient will be observed post-procedure,                            until all  discharge criteria are met.                           - Return patient to hospital ward for ongoing care.                           - Patient has a contact number available for                            emergencies. The signs and symptoms of potential                            delayed complications were discussed with the                             patient. Return to normal activities tomorrow.                            Written discharge instructions were provided to the                            patient.                           - Diet as per VCE protocol (see RN notes for                            details).                           - PPI 40 mg twice daily.                           - Carafate 1 g QID x 4-weeks and then BID.                           - Ciprofloxacin 500 mg BID x 3-days to decrease                            post-interventional infectious risk.                           - Followup VCE pickup tomorrow.                           - Repeat EGD in 1-months for repeat evaluation of                            GAVE is reasonable and can be pursued by primary GI                            if any further treatment may be necessary.                           - If anticoagulation to  restart, then would give at                            least 72 hours (8/14) before restart. But this is                            also to be determined based on findings of the VCE.                           - The findings and recommendations were discussed                            with the patient.                           - The findings and recommendations were discussed                            with the patient's family.                           - The findings and recommendations were discussed                            with the referring physician.  VCE  Antimicrobials:  Anti-infectives (From admission, onward)    Start     Dose/Rate Route Frequency Ordered Stop   08/27/22 0800  ciprofloxacin (CIPRO) tablet 250 mg        250 mg Oral 2 times daily 08/26/22 1611 08/30/22 0759   08/26/22 2000  ciprofloxacin (CIPRO) tablet 500 mg  Status:  Discontinued        500 mg Oral 2 times daily 08/26/22 1602 08/26/22 1610   08/26/22 1200  ciprofloxacin (CIPRO) IVPB 400 mg  Status:  Discontinued        400 mg 200 mL/hr over 60  Minutes Intravenous Every 24 hours 08/26/22 1117 08/26/22 1611   08/26/22 1115  ciprofloxacin (CIPRO) IVPB 400 mg  Status:  Discontinued        400 mg 200 mL/hr over 60 Minutes Intravenous Every 12 hours 08/26/22 1106 08/26/22 1117       Subjective: Seen and examined at bedside this morning and he was feeling okay and awaiting for GI to evaluate.  No chest pain or shortness breath.  Denies any other concerns or complaints this time.  Objective: Vitals:   08/26/22 1915 08/27/22 0601 08/27/22 0848 08/27/22 1435  BP: 110/66 (!) 108/43 126/64 (!) 108/51  Pulse: (!) 59 61 60 69  Resp: 20 19 17 19   Temp: (!) 97.4 F (36.3 C) 97.8 F (36.6 C) 98.2 F (36.8 C) 98.2 F (36.8 C)  TempSrc: Oral Oral Oral Oral  SpO2: 100% 97% 98% 99%  Weight:      Height:        Intake/Output Summary (Last 24 hours) at 08/27/2022 1820 Last data filed at 08/27/2022 1300 Gross per 24 hour  Intake 970 ml  Output --  Net 970 ml   Filed Weights   08/24/22 1456 08/26/22 0500 08/26/22 1025  Weight: 90 kg 90.1 kg 90.1 kg   Examination: Physical Exam:  Constitutional: WN/WD  elderly Caucasian male who was laying in bed Respiratory: Diminished to auscultation bilaterally, no wheezing, rales, rhonchi or crackles. Normal respiratory effort and patient is not tachypenic. No accessory muscle use.  Unlabored breathing Cardiovascular: RRR, no murmurs / rubs / gallops. S1 and S2 auscultated.  1+ extremity edema Abdomen: Soft, non-tender, distended secondary to body habitus. Bowel sounds positive.  GU: Deferred. Musculoskeletal: No clubbing / cyanosis of digits/nails. No joint deformity upper and lower extremities.  Skin: No rashes, lesions, ulcers on limited skin evaluation. No induration; Warm and dry.  Neurologic: CN 2-12 grossly intact with no focal deficits. Romberg sign and cerebellar reflexes not assessed.  Psychiatric: Normal judgment and insight. Alert and oriented x 3. Normal mood and appropriate affect.    Data Reviewed: I have personally reviewed following labs and imaging studies  CBC: Recent Labs  Lab 08/24/22 1543 08/25/22 0838 08/26/22 0530 08/27/22 0529  WBC 9.5 8.6 8.6 9.8  NEUTROABS 6.7 6.1 6.1 7.3  HGB 7.2* 9.4* 8.7* 9.1*  HCT 25.0* 31.9* 29.2* 30.5*  MCV 85.0 85.5 85.9 85.7  PLT 230 243 229 243   Basic Metabolic Panel: Recent Labs  Lab 08/24/22 1543 08/24/22 1653 08/25/22 0838 08/26/22 0530 08/27/22 0529  NA 132*  --  131* 134* 132*  K 5.3* 4.7 4.6 4.7 4.5  CL 99  --  100 101 100  CO2 22  --  19* 21* 21*  GLUCOSE 111*  --  159* 62* 90  BUN 71*  --  71* 69* 70*  CREATININE 3.69*  --  3.75* 3.61* 3.62*  CALCIUM 8.6*  --  8.1* 8.1* 8.4*  MG  --   --  2.2 2.4 2.2  PHOS  --   --  4.9* 4.3 4.2   GFR: Estimated Creatinine Clearance: 18.4 mL/min (A) (by C-G formula based on SCr of 3.62 mg/dL (H)). Liver Function Tests: Recent Labs  Lab 08/24/22 1543 08/25/22 0838 08/26/22 0530 08/27/22 0529  AST 21 23 18 20   ALT 16 16 14 13   ALKPHOS 151* 137* 132* 133*  BILITOT 1.0 1.4* 1.4* 1.7*  PROT 7.9 7.2 7.3 7.1  ALBUMIN 3.1* 3.0* 2.9* 2.8*   No results for input(s): "LIPASE", "AMYLASE" in the last 168 hours. No results for input(s): "AMMONIA" in the last 168 hours. Coagulation Profile: No results for input(s): "INR", "PROTIME" in the last 168 hours. Cardiac Enzymes: No results for input(s): "CKTOTAL", "CKMB", "CKMBINDEX", "TROPONINI" in the last 168 hours. BNP (last 3 results) No results for input(s): "PROBNP" in the last 8760 hours. HbA1C: No results for input(s): "HGBA1C" in the last 72 hours. CBG: Recent Labs  Lab 08/26/22 1647 08/26/22 2136 08/27/22 0801 08/27/22 1203 08/27/22 1630  GLUCAP 144* 176* 87 103* 118*   Lipid Profile: No results for input(s): "CHOL", "HDL", "LDLCALC", "TRIG", "CHOLHDL", "LDLDIRECT" in the last 72 hours. Thyroid Function Tests: No results for input(s): "TSH", "T4TOTAL", "FREET4", "T3FREE", "THYROIDAB" in the last 72  hours. Anemia Panel: No results for input(s): "VITAMINB12", "FOLATE", "FERRITIN", "TIBC", "IRON", "RETICCTPCT" in the last 72 hours. Sepsis Labs: No results for input(s): "PROCALCITON", "LATICACIDVEN" in the last 168 hours.  Recent Results (from the past 240 hour(s))  Urine Culture     Status: None   Collection Time: 08/18/22  3:59 PM   Specimen: Urine, Clean Catch  Result Value Ref Range Status   Specimen Description   Final    URINE, CLEAN CATCH Performed at [facility], [address], [place], [place] [address]  Special Requests   Final    NONE Performed at [facility], [address] [place], [place] [address]    Culture   Final    NO GROWTH Performed at [facility], [address], [place], [place] [address]    Report Status 08/19/2022 FINAL  Final  Resp panel by RT-PCR (RSV, Flu A&B, Covid) Urine, Clean Catch     Status: None   Collection Time: 08/18/22  3:59 PM   Specimen: Urine, Clean Catch; Nasal Swab  Result Value Ref Range Status   SARS Coronavirus 2 by RT PCR NEGATIVE NEGATIVE Final    Comment: (NOTE) SARS-CoV-2 target nucleic acids are NOT DETECTED.  The SARS-CoV-2 RNA is generally detectable in upper respiratory specimens during the acute phase of infection. The lowest concentration of SARS-CoV-2 viral copies this assay can detect is 138 copies/mL. A negative result does not preclude SARS-Cov-2 infection and should not be used as the sole basis for treatment or other patient management decisions. A negative result may occur with  improper specimen collection/handling, submission of specimen other than nasopharyngeal swab, presence of viral mutation(s) within the areas targeted by this assay, and inadequate number of viral copies(<138 copies/mL). A negative result must be combined with clinical observations, patient history, and epidemiological information. The expected result is Negative.  Fact Sheet for  Patients:  BloggerCourse.com  Fact Sheet for Healthcare Providers:  SeriousBroker.it  This test is no t yet approved or cleared by the [place] FDA and  has been authorized for detection and/or diagnosis of SARS-CoV-2 by FDA under an Emergency Use Authorization (EUA). This EUA will remain  in effect (meaning this test can be used) for the duration of the COVID-19 declaration under Section 564(b)(1) of the Act, 21 U.S.C.section 360bbb-3(b)(1), unless the authorization is terminated  or revoked sooner.       Influenza A by PCR NEGATIVE NEGATIVE Final   Influenza B by PCR NEGATIVE NEGATIVE Final    Comment: (NOTE) The Xpert Xpress SARS-CoV-2/FLU/RSV plus assay is intended as an aid in the diagnosis of influenza from Nasopharyngeal swab specimens and should not be used as a sole basis for treatment. Nasal washings and aspirates are unacceptable for Xpert Xpress SARS-CoV-2/FLU/RSV testing.  Fact Sheet for Patients: BloggerCourse.com  Fact Sheet for Healthcare Providers: SeriousBroker.it  This test is not yet approved or cleared by the [place] FDA and has been authorized for detection and/or diagnosis of SARS-CoV-2 by FDA under an Emergency Use Authorization (EUA). This EUA will remain in effect (meaning this test can be used) for the duration of the COVID-19 declaration under Section 564(b)(1) of the Act, 21 U.S.C. section 360bbb-3(b)(1), unless the authorization is terminated or revoked.     Resp Syncytial Virus by PCR NEGATIVE NEGATIVE Final    Comment: (NOTE) Fact Sheet for Patients: BloggerCourse.com  Fact Sheet for Healthcare Providers: SeriousBroker.it  This test is not yet approved or cleared by the [place] FDA and has been authorized for detection and/or diagnosis of SARS-CoV-2 by FDA under an Emergency  Use Authorization (EUA). This EUA will remain in effect (meaning this test can be used) for the duration of the COVID-19 declaration under Section 564(b)(1) of the Act, 21 U.S.C. section 360bbb-3(b)(1), unless the authorization is terminated or revoked.  Performed at [facility], [address] [place], [place] [address]     Radiology Studies: No results found.  Scheduled Meds:  allopurinol  100 mg Oral Daily   carvedilol  6.25 mg Oral BID   ciprofloxacin  250 mg Oral BID   furosemide  40 mg Oral QPM   furosemide  60 mg Oral Q breakfast   insulin aspart  0-5 Units Subcutaneous QHS   insulin aspart  0-9 Units Subcutaneous TID WC   insulin glargine-yfgn  40 Units Subcutaneous Daily   isosorbide mononitrate  30 mg Oral Daily   levothyroxine  25 mcg Oral Q0600   pantoprazole  40 mg Oral BID   sucralfate  1 g Oral TID WC & HS   Continuous Infusions:   LOS: 2 days   Marguerita Merles, DO [facility] Available via Epic secure chat 7am-7pm After these hours, please refer to coverage provider listed on [email or web address] 08/27/2022, 6:20 PM

## 2022-08-27 NOTE — Progress Notes (Signed)
   08/27/22 1114  TOC Brief Assessment  Insurance and Status Reviewed  Patient has primary care physician Yes  Home environment has been reviewed Home w/ spouse  Prior level of function: Independent  Prior/Current Home Services No current home services  Social Determinants of Health Reivew SDOH reviewed no interventions necessary  Readmission risk has been reviewed Yes  Transition of care needs no transition of care needs at this time

## 2022-08-28 DIAGNOSIS — I10 Essential (primary) hypertension: Secondary | ICD-10-CM | POA: Diagnosis not present

## 2022-08-28 DIAGNOSIS — D649 Anemia, unspecified: Secondary | ICD-10-CM | POA: Diagnosis not present

## 2022-08-28 DIAGNOSIS — I5032 Chronic diastolic (congestive) heart failure: Secondary | ICD-10-CM | POA: Diagnosis not present

## 2022-08-28 DIAGNOSIS — N179 Acute kidney failure, unspecified: Secondary | ICD-10-CM | POA: Diagnosis not present

## 2022-08-28 LAB — GLUCOSE, CAPILLARY: Glucose-Capillary: 134 mg/dL — ABNORMAL HIGH (ref 70–99)

## 2022-08-28 MED ORDER — CIPROFLOXACIN HCL 250 MG PO TABS: 250.0000 mg | ORAL_TABLET | Freq: Two times a day (BID) | ORAL | 0 refills | Status: AC

## 2022-08-28 MED ORDER — SUCRALFATE 1 G PO TABS: 1.0000 g | ORAL_TABLET | Freq: Three times a day (TID) | ORAL | 1 refills | Status: DC

## 2022-08-28 MED ORDER — ONDANSETRON HCL 4 MG PO TABS: 4.0000 mg | ORAL_TABLET | Freq: Four times a day (QID) | ORAL | 0 refills | Status: DC | PRN

## 2022-08-28 MED ORDER — PANTOPRAZOLE SODIUM 40 MG PO TBEC: 40.0000 mg | DELAYED_RELEASE_TABLET | Freq: Two times a day (BID) | ORAL | 1 refills | Status: AC

## 2022-08-28 NOTE — Discharge Summary (Signed)
Physician Discharge Summary   Patient: Edward [name] MRN: [id number] DOB: [birth year]  Admit date:     08/24/2022  Discharge date: 08/28/22  Discharge Physician: Marguerita Merles. DO   PCP: Byrd Hesselbach, PA   Recommendations at discharge:   Follow-up with PCP within 1 week and repeat CBC, CMP, mag, Phos within 1 week Follow-up with gastroenterology in outpatient setting Follow-up with nephrology outpatient setting Follow-up with cardiology outpatient setting  Discharge Diagnoses: Principal Problem:   Symptomatic anemia Active Problems:   Acute renal failure superimposed on stage 4 chronic kidney disease (HCC)   PAF (paroxysmal atrial fibrillation) (HCC)   Diabetes mellitus (HCC)   Hyperlipidemia   Pacemaker-Medtronic   Chronic diastolic CHF (congestive heart failure) (HCC)   CKD (chronic kidney disease) stage 4, GFR 15-29 ml/min (HCC)   Benign essential hypertension   Type 2 diabetes mellitus with hyperglycemia (HCC)   GI bleed  Resolved Problems:   * No resolved hospital problems. [facility] Course: The patient is an 82 year old Caucasian male with a past medical history significant for volume to chronic diastolic CHF, chronic kidney disease stage IV, history of proximal atrial fibrillation on anticoagulation which was recently held, history of recurrent GI bleeding, history of cirrhosis of the liver, history of chronic anemia, history of permanent pacemaker as well as other comorbidities who presented to the ED yesterday for worsening fatigue and lethargy.  He was recently admitted to the hospital last week for an AKI was given IV fluids and his diuretics were held and he was also given a unit of blood.  He had intermittent blood in his stools and he was currently undergoing outpatient GI workup and had a colonoscopy and upper endoscopy last year.  He is scheduled for a capsule endoscopy on Monday but he continued have increasing fatigue at home and wife noticed that his blood  count had dropped on labs so she brought him into the ED for further evaluation.  He was typed and screened and transfused 2 units of PRBCs.  Renal function is started to worsen so IV fluid hydration has now held given that he also received blood and will start him back on diuresis.  GI was consulted for further evaluation and they are planning EGD with video capsule endoscopy to be placed.  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  Patient was treated with radiofrequency ablation and GI recommending Diet advancement per VCE protocol.  VCE was read and showed no bleeding small bowel lesions and did have some limitations though due to small bowel secretions.  GI has now signed off the case and recommending current plan and anticipating discharging in the next 24 hours.  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  -Getting ready for capsule endoscopy next week by Dr. Rhea Belton but I consulted GI Inpatient and they evaluated and planning for a EGD with video capsule endoscopy to be placed as well in the a.m. and also start him on mild bowel regimen with bisacodyl 10 mg -Hgb/Hct Trend: Recent Labs  Lab 08/18/22 1422 08/19/22 0746 08/24/22 1543 08/25/22 0838 08/26/22 0530 08/27/22 0529 08/28/22 0527  HGB 7.2* 7.8* 7.2* 9.4* 8.7* 9.1* 8.7*  HCT 25.2* 27.5* 25.0* 31.9* 29.2* 30.5* 29.5*  MCV 86.6 86.8 85.0 85.5 85.9 85.7 85.0  -Continue to Monitor for S/Sx of Bleeding;  -  EGD done and showed " Portal hypertensive gastropathy proximally. Erythematous mucosa in the stomach. 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 Treated with   radiofrequency ablation. No gross lesions in the duodenal bulb, in the    first portion of the duodenum and in the second portion of the duodenum. Successful completion of the Video  Capsule Enteroscope placement into the bulb." -Repeat CBC within 1 week -GI recommending advancing the diet per the VCE protocol and continuing PPI 40 mg p.o. twice daily and adding Carafate 1 g 4 times daily for 4 weeks and then twice daily. -They are initiating patient on ciprofloxacin 500 g twice daily for 3 days to decrease the post interventional infectious risk and repeating EGD in 2 months for repeat evaluation of GAVE and following on the VCE tomorrow. -GI feels that the patient can be restarted on anticoagulation 08/29/2022  -VCE being read by Gastroenterology and showed no small bowel bleeding lesions.  The study was limited due to retained small bowel secretions and GI feels further care per the EGD note with resumption of anticoagulation on 08/29/2022.  -Follow-up with PCP and GI within 1 to 2 weeks  Benign Essential Hypertension -Stable.  -Continue with Imdur and Coreg.  -Initially held Lasix and Aldactone due to AKI but now just resumed the Lasix and continuing to hold the Aldactone -Continue monitor blood pressures per protocol -Last blood pressure reading was 119/56   AKI on CKD (chronic kidney disease) stage 4, GFR 15-29 ml/min (HCC) Metabolic Acidosis, improved  -Acutely worse due to anemia and continued use of Lasix/Aldacone at home -Stopped IVF due to Hx of Anasarca and Diastolic CHF -Follows with nephrology at the [facility].  -Pt states he was told he is not ready for HD yet. -Patient had a slight metabolic acidosis with a CO2 of 19, anion gap of 12, Chloride Level of 100; Now CO2 is 22, AG is 11, and Chloride Level is 99 -BUN/Cr Trend: Recent Labs  Lab 08/18/22 1422 08/19/22 0746 08/24/22 1543 08/25/22 0838 08/26/22 0530 08/27/22 0529 08/28/22 0527  BUN 61* 59* 71* 71* 69* 70* 71*  CREATININE 3.01* 2.67* 3.69* 3.75* 3.61* 3.62* 3.75*  -Given IVF and Blood but appears Volume overloaded a little so will resume Home Diuretics with just Furosemide 60 mg in  the AM and 40 mg po qHS -Avoid Nephrotoxic Medications, Contrast Dyes, Hypotension and Dehydration to Ensure Adequate Renal Perfusion and will need to Renally Adjust Meds -Continue to Monitor and Trend Renal Function carefully and repeat CMP within 1 week -Follow-up with nephrology in outpatient setting  Chronic Diastolic CHF (congestive heart failure) (HCC) Was Holding lasix, aldactone due to AKI but renal function worsened after and he is a little volume overloaded.  Now will resume his home Lasix of 60 mg in the a.m. and 40 mg in the evening -Continue with coreg, imdur. -Strict I's and O's and Daily Weights  Intake/Output Summary (Last 24 hours) at 08/28/2022 2335 Last data filed at 08/28/2022 1114 Gross per 24 hour  Intake 237 ml  Output --  Net 237 ml  -Continue to Monitor for S/Sx of Volume Overload -Obtain CXR in the AM   Hyponatremia -Na+ Trend Recent Labs  Lab 08/18/22 1422 08/19/22 0746 08/24/22 1543 08/25/22 0838 08/26/22 0530 08/27/22 0529 08/28/22 0527  NA 138 136 132* 131* 134* 132* 132*  -Resume Lasix and continue to Monitor  -Continue monitor and trend and repeat CMP in a.m. -  Follow-up with nephrology in outpatient setting  Pacemaker-Medtronic -Chronic.   Hyperlipidemia -Stable.   Diabetes Mellitus Type 2 with Hyperglycemia -Stable.  -Continue with Semglee 40 units subcu daily and now sensitive NovoLog sign scale insulin before meals and at bedtime has been added -Continue monitor blood sugars per protocol -CBG Trend: Recent Labs  Lab 08/26/22 1647 08/26/22 2136 08/27/22 0801 08/27/22 1203 08/27/22 1630 08/27/22 2137 08/28/22 0745  GLUCAP 144* 176* 87 103* 118* 170* 134*  -Glucose Trend: Recent Labs  Lab 08/18/22 1422 08/19/22 0746 08/24/22 1543 08/25/22 0838 08/26/22 0530 08/27/22 0529 08/28/22 0527  GLUCOSE 145* 125* 111* 159* 62* 90 151*    PAF (paroxysmal atrial fibrillation) (HCC) -Stable.  -Has PPM.  -Off Eliquis this week due  to intermittent GI bleeding. -Continue with beta-blocker with carvedilol 6.25 mg p.o. twice daily -Monitor on telemetry if needed  Hypothyroidism -Continue Levothyroxine 25 mcg p.o. daily and check TSH in the a.m.   GERD/GI Prophylaxis -C/w Pantoprazole 40 mg po twice daily and Carafate as above  Hyperbilirubinemia, improving slightly -Likely Reactive -Bilirubin Trend: Recent Labs  Lab 08/18/22 1422 08/19/22 0746 08/24/22 1543 08/25/22 0838 08/26/22 0530 08/27/22 0529 08/28/22 0527  BILITOT 1.0 1.3* 1.0 1.4* 1.4* 1.7* 1.4*  -Continue to Monitor and Trend and repeat CMP in the AM   Hypoalbuminemia -Patient's Albumin Trend: Recent Labs  Lab 08/18/22 1422 08/19/22 0746 08/24/22 1543 08/25/22 0838 08/26/22 0530 08/27/22 0529 08/28/22 0527  ALBUMIN 3.0* 3.1* 3.1* 3.0* 2.9* 2.8* 2.9*  -Continue to Monitor and Trend and repeat CMP in the AM  Consultants: Gastroenterology Procedures performed: EGD and VCE Disposition: Home Diet recommendation:  Discharge Diet Orders (From admission, onward)     Start     Ordered   08/28/22 0000  Diet - low sodium heart healthy        08/28/22 1136           Cardiac and Carb modified diet DISCHARGE MEDICATION: Allergies as of 08/28/2022       Reactions   Lipitor [atorvastatin Calcium] Other (See Comments)   Weakness/ pain in legs        Medication List     TAKE these medications    acetaminophen 650 MG CR tablet Commonly known as: TYLENOL Take 100 mg by mouth daily.   allopurinol 100 MG tablet Commonly known as: ZYLOPRIM Take 100 mg by mouth daily.   carvedilol 12.5 MG tablet Commonly known as: COREG Take 6.25 mg by mouth 2 (two) times daily with a meal.   cholecalciferol 25 MCG (1000 UNIT) tablet Commonly known as: VITAMIN D3 Take 2,000 Units by mouth daily.   ciprofloxacin 250 MG tablet Commonly known as: CIPRO Take 1 tablet (250 mg total) by mouth 2 (two) times daily for 1 day.   cyanocobalamin 1000  MCG tablet Take 1,000 mcg by mouth daily.   furosemide 20 MG tablet Commonly known as: LASIX Take 40-60 mg by mouth See admin instructions. Takes 60 mg in the morning and 40 mg at night   insulin glargine 100 UNIT/ML injection Commonly known as: LANTUS Inject 48 Units into the skin in the morning.   isosorbide mononitrate 30 MG 24 hr tablet Commonly known as: IMDUR Take 30 mg by mouth daily.   levothyroxine 25 MCG tablet Commonly known as: SYNTHROID Take 25 mcg by mouth daily before breakfast.   magnesium oxide 400 (240 Mg) MG tablet Commonly known as: MAG-OX Take 2 tablets by mouth daily.   multivitamin capsule Take 1  capsule by mouth daily.   ondansetron 4 MG tablet Commonly known as: ZOFRAN Take 1 tablet (4 mg total) by mouth every 6 (six) hours as needed for nausea.   pantoprazole 40 MG tablet Commonly known as: PROTONIX Take 1 tablet (40 mg total) by mouth 2 (two) times daily. What changed:  how much to take when to take this   Semaglutide (1 MG/DOSE) 2 MG/1.5ML Sopn Inject 1 mg into the skin every Sunday.   spironolactone 25 MG tablet Commonly known as: ALDACTONE Take 1 tablet (25 mg total) by mouth daily.   sucralfate 1 g tablet Commonly known as: CARAFATE Take 1 tablet (1 g total) by mouth 4 (four) times daily -  with meals and at bedtime. Take for a Month and then take BID   Systane Balance 0.6 % Soln Generic drug: Propylene Glycol Place 1 drop into both eyes daily as needed.   testosterone cypionate 200 MG/ML injection Commonly known as: DEPOTESTOSTERONE CYPIONATE Inject 200 mg into the muscle every 14 (fourteen) days. Given every other Sunday   Zinc 50 MG Tabs Take 1 tablet (50 mg total) by mouth daily.        Discharge Exam: Filed Weights   08/24/22 1456 08/26/22 0500 08/26/22 1025  Weight: 90 kg 90.1 kg 90.1 kg   Vitals:   08/27/22 1946 08/28/22 0517  BP: 119/61 (!) 115/59  Pulse: (!) 59 60  Resp: 16 16  Temp: 97.8 F (36.6 C)  98.4 F (36.9 C)  SpO2: 100% 100%   Examination: Physical Exam:  Constitutional: WN/WD overweight Caucasian male in no acute distress Respiratory: Diminished to auscultation bilaterally, no wheezing, rales, rhonchi or crackles. Normal respiratory effort and patient is not tachypenic. No accessory muscle use.  Unlabored breathing Cardiovascular: RRR, no murmurs / rubs / gallops. S1 and S2 auscultated.  1+ extremity edema Abdomen: Soft, non-tender, distended secondary to body habitus bowel sounds positive.  GU: Deferred. Musculoskeletal: No clubbing / cyanosis of digits/nails. No joint deformity upper and lower extremities.  Skin: No rashes, lesions, ulcers limited skin evaluation. No induration; Warm and dry.  Neurologic: CN 2-12 grossly intact with no focal deficits. Romberg sign and cerebellar reflexes not assessed.  Psychiatric: Normal judgment and insight. Alert and oriented x 3. Normal mood and appropriate affect.   Condition at discharge: stable  The results of significant diagnostics from this hospitalization (including imaging, microbiology, ancillary and laboratory) are listed below for reference.   Imaging Studies: DG Chest 2 View  Result Date: 08/18/2022 CLINICAL DATA:  Altered mental status, evaluate for infection EXAM: CHEST - 2 VIEW COMPARISON:  05/25/2022 FINDINGS: Transverse diameter of heart is increased. Metallic sutures are seen in the sternum. There is metallic clamp in region of left atrial appendage. Pacemaker battery is seen in the left infraclavicular region with tips of leads in right atrium and right ventricle. There are no signs of alveolar pulmonary edema or focal pulmonary consolidation. There is no pleural effusion or pneumothorax. IMPRESSION: Cardiomegaly. There are no signs of pulmonary edema or focal pulmonary consolidation. Electronically Signed   By: Ernie Avena M.D.   On: 08/18/2022 15:12   CT HEAD WO CONTRAST ( )  Result Date:  08/18/2022 CLINICAL DATA:  Mental status changes. EXAM: CT HEAD WITHOUT CONTRAST TECHNIQUE: Contiguous axial images were obtained from the base of the skull through the vertex without intravenous contrast. RADIATION DOSE REDUCTION: This exam was performed according to the departmental dose-optimization program which includes automated exposure control, adjustment of the mA  and/or kV according to patient size and/or use of iterative reconstruction technique. COMPARISON:  09/30/2020 FINDINGS: Brain: No evidence for acute hemorrhage, hydrocephalus, or midline shift. No definite CT evidence for acute ischemia. 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 Stable 3.7 x 2.5 cm (3.9 x 2.6 cm reported previously) superior left convexity meningioma without appreciable adjacent edema. As before, lesion does generates some mass-effect on the adjacent cortex. Vascular: No hyperdense vessel or unexpected calcification. Skull: No evidence for fracture. No worrisome lytic or sclerotic lesion. Sinuses/Orbits: The visualized paranasal sinuses and mastoid air cells are clear. Visualized portions of the globes and intraorbital fat are unremarkable. Other: None IMPRESSION: 1. No acute intracranial abnormality. 2. 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 3. Old right parietal infarct. Electronically Signed   By: Kennith Center M.D.   On: 08/18/2022 14:58    Microbiology: Results for orders placed or performed during the hospital encounter of 08/18/22  Urine Culture     Status: None   Collection Time: 08/18/22  3:59 PM   Specimen: Urine, Clean Catch  Result Value Ref Range Status   Specimen Description   Final    URINE, CLEAN CATCH Performed at [facility], [address], [place], [place] [address]    Special Requests   Final    NONE Performed at [facility], [address], [place], [place] [address]    Culture   Final    NO GROWTH Performed at [facility],  [address] [place], [place] [address]    Report Status 08/19/2022 FINAL  Final  Resp panel by RT-PCR (RSV, Flu A&B, Covid) Urine, Clean Catch     Status: None   Collection Time: 08/18/22  3:59 PM   Specimen: Urine, Clean Catch; Nasal Swab  Result Value Ref Range Status   SARS Coronavirus 2 by RT PCR NEGATIVE NEGATIVE Final    Comment: (NOTE) SARS-CoV-2 target nucleic acids are NOT DETECTED.  The SARS-CoV-2 RNA is generally detectable in upper respiratory specimens during the acute phase of infection. The lowest concentration of SARS-CoV-2 viral copies this assay can detect is 138 copies/mL. A negative result does not preclude SARS-Cov-2 infection and should not be used as the sole basis for treatment or other patient management decisions. A negative result may occur with  improper specimen collection/handling, submission of specimen other than nasopharyngeal swab, presence of viral mutation(s) within the areas targeted by this assay, and inadequate number of viral copies(<138 copies/mL). A negative result must be combined with clinical observations, patient history, and epidemiological information. The expected result is Negative.  Fact Sheet for Patients:  BloggerCourse.com  Fact Sheet for Healthcare Providers:  SeriousBroker.it  This test is no t yet approved or cleared by the [place] FDA and  has been authorized for detection and/or diagnosis of SARS-CoV-2 by FDA under an Emergency Use Authorization (EUA). This EUA will remain  in effect (meaning this test can be used) for the duration of the COVID-19 declaration under Section 564(b)(1) of the Act, 21 U.S.C.section 360bbb-3(b)(1), unless the authorization is terminated  or revoked sooner.       Influenza A by PCR NEGATIVE NEGATIVE Final   Influenza B by PCR NEGATIVE NEGATIVE Final    Comment: (NOTE) The Xpert Xpress SARS-CoV-2/FLU/RSV plus assay is intended as an  aid in the diagnosis of influenza from Nasopharyngeal swab specimens and should not be used as a sole basis for treatment. Nasal washings and aspirates are unacceptable  for Xpert Xpress SARS-CoV-2/FLU/RSV testing.  Fact Sheet for Patients: BloggerCourse.com  Fact Sheet for Healthcare Providers: SeriousBroker.it  This test is not yet approved or cleared by the [place] FDA and has been authorized for detection and/or diagnosis of SARS-CoV-2 by FDA under an Emergency Use Authorization (EUA). This EUA will remain in effect (meaning this test can be used) for the duration of the COVID-19 declaration under Section 564(b)(1) of the Act, 21 U.S.C. section 360bbb-3(b)(1), unless the authorization is terminated or revoked.     Resp Syncytial Virus by PCR NEGATIVE NEGATIVE Final    Comment: (NOTE) Fact Sheet for Patients: BloggerCourse.com  Fact Sheet for Healthcare Providers: SeriousBroker.it  This test is not yet approved or cleared by the [place] FDA and has been authorized for detection and/or diagnosis of SARS-CoV-2 by FDA under an Emergency Use Authorization (EUA). This EUA will remain in effect (meaning this test can be used) for the duration of the COVID-19 declaration under Section 564(b)(1) of the Act, 21 U.S.C. section 360bbb-3(b)(1), unless the authorization is terminated or revoked.  Performed at [facility], [address] [place], [place] [address]    Labs: CBC: Recent Labs  Lab 08/24/22 1543 08/25/22 0838 08/26/22 0530 08/27/22 0529 08/28/22 0527  WBC 9.5 8.6 8.6 9.8 9.6  NEUTROABS 6.7 6.1 6.1 7.3 6.9  HGB 7.2* 9.4* 8.7* 9.1* 8.7*  HCT 25.0* 31.9* 29.2* 30.5* 29.5*  MCV 85.0 85.5 85.9 85.7 85.0  PLT 230 243 229 243 246   Basic Metabolic Panel: Recent Labs  Lab 08/24/22 1543 08/24/22 1653 08/25/22 0838 08/26/22 0530  08/27/22 0529 08/28/22 0527  NA 132*  --  131* 134* 132* 132*  K 5.3* 4.7 4.6 4.7 4.5 4.2  CL 99  --  100 101 100 99  CO2 22  --  19* 21* 21* 22  GLUCOSE 111*  --  159* 62* 90 151*  BUN 71*  --  71* 69* 70* 71*  CREATININE 3.69*  --  3.75* 3.61* 3.62* 3.75*  CALCIUM 8.6*  --  8.1* 8.1* 8.4* 8.4*  MG  --   --  2.2 2.4 2.2 2.2  PHOS  --   --  4.9* 4.3 4.2 4.6   Liver Function Tests: Recent Labs  Lab 08/24/22 1543 08/25/22 0838 08/26/22 0530 08/27/22 0529 08/28/22 0527  AST 21 23 18 20 17   ALT 16 16 14 13 12   ALKPHOS 151* 137* 132* 133* 135*  BILITOT 1.0 1.4* 1.4* 1.7* 1.4*  PROT 7.9 7.2 7.3 7.1 7.1  ALBUMIN 3.1* 3.0* 2.9* 2.8* 2.9*   CBG: Recent Labs  Lab 08/27/22 0801 08/27/22 1203 08/27/22 1630 08/27/22 2137 08/28/22 0745  GLUCAP 87 103* 118* 170* 134*   Discharge time spent: greater than 30 minutes.  Signed: Marguerita Merles, DO [facility] 08/28/2022

## 2022-08-28 NOTE — Progress Notes (Signed)
Nurse tech went into patients room to obtain blood sugar. Patient is refusing blood sugar and medications. States he was supposed to be discharged first thing this morning. Dr. Marguerita Merles and Dr. Lynann Bologna aware.

## 2022-08-29 ENCOUNTER — Encounter: Payer: Self-pay | Admitting: Gastroenterology

## 2022-08-30 ENCOUNTER — Encounter (HOSPITAL_COMMUNITY): Payer: Self-pay | Admitting: Gastroenterology

## 2022-08-30 NOTE — Telephone Encounter (Signed)
VCE: no SB bleeding lesions. Report will soon be scanned by endo/[facility] med records Plan per EGD note (Dr Meridee Score) RG

## 2022-08-30 NOTE — Telephone Encounter (Signed)
Bonita Quin I believe this is a Dr Rhea Belton pt. Dr Meridee Score did his procedure at the hospital for Dr Rhea Belton.

## 2022-08-31 NOTE — Telephone Encounter (Signed)
Pt notified of capsule results per Dr. Chales Abrahams.

## 2022-09-20 ENCOUNTER — Other Ambulatory Visit: Payer: Self-pay

## 2022-09-20 ENCOUNTER — Encounter (HOSPITAL_COMMUNITY): Payer: Self-pay

## 2022-09-20 ENCOUNTER — Inpatient Hospital Stay (HOSPITAL_COMMUNITY)
Admission: EM | Admit: 2022-09-20 | Discharge: 2022-09-23 | DRG: 377 | Disposition: A | Discharge status: Home / Self Care | Payer: No Typology Code available for payment source | Attending: Internal Medicine | Admitting: Internal Medicine

## 2022-09-20 DIAGNOSIS — Z96652 Presence of left artificial knee joint: Secondary | ICD-10-CM | POA: Diagnosis present

## 2022-09-20 DIAGNOSIS — Z800 Family history of malignant neoplasm of digestive organs: Secondary | ICD-10-CM

## 2022-09-20 DIAGNOSIS — Z87891 Personal history of nicotine dependence: Secondary | ICD-10-CM

## 2022-09-20 DIAGNOSIS — G9341 Metabolic encephalopathy: Secondary | ICD-10-CM | POA: Diagnosis present

## 2022-09-20 DIAGNOSIS — E1122 Type 2 diabetes mellitus with diabetic chronic kidney disease: Secondary | ICD-10-CM | POA: Diagnosis present

## 2022-09-20 DIAGNOSIS — I130 Hypertensive heart and chronic kidney disease with heart failure and stage 1 through stage 4 chronic kidney disease, or unspecified chronic kidney disease: Secondary | ICD-10-CM | POA: Diagnosis present

## 2022-09-20 DIAGNOSIS — I6529 Occlusion and stenosis of unspecified carotid artery: Secondary | ICD-10-CM | POA: Diagnosis present

## 2022-09-20 DIAGNOSIS — N184 Chronic kidney disease, stage 4 (severe): Secondary | ICD-10-CM | POA: Diagnosis present

## 2022-09-20 DIAGNOSIS — N179 Acute kidney failure, unspecified: Secondary | ICD-10-CM | POA: Diagnosis present

## 2022-09-20 DIAGNOSIS — K31819 Angiodysplasia of stomach and duodenum without bleeding: Secondary | ICD-10-CM

## 2022-09-20 DIAGNOSIS — K449 Diaphragmatic hernia without obstruction or gangrene: Secondary | ICD-10-CM | POA: Diagnosis present

## 2022-09-20 DIAGNOSIS — Z794 Long term (current) use of insulin: Secondary | ICD-10-CM

## 2022-09-20 DIAGNOSIS — Z888 Allergy status to other drugs, medicaments and biological substances status: Secondary | ICD-10-CM

## 2022-09-20 DIAGNOSIS — I5032 Chronic diastolic (congestive) heart failure: Secondary | ICD-10-CM | POA: Diagnosis present

## 2022-09-20 DIAGNOSIS — M109 Gout, unspecified: Secondary | ICD-10-CM | POA: Diagnosis present

## 2022-09-20 DIAGNOSIS — Z8249 Family history of ischemic heart disease and other diseases of the circulatory system: Secondary | ICD-10-CM

## 2022-09-20 DIAGNOSIS — K625 Hemorrhage of anus and rectum: Secondary | ICD-10-CM | POA: Diagnosis not present

## 2022-09-20 DIAGNOSIS — Z83438 Family history of other disorder of lipoprotein metabolism and other lipidemia: Secondary | ICD-10-CM

## 2022-09-20 DIAGNOSIS — Z951 Presence of aortocoronary bypass graft: Secondary | ICD-10-CM

## 2022-09-20 DIAGNOSIS — D649 Anemia, unspecified: Secondary | ICD-10-CM | POA: Diagnosis present

## 2022-09-20 DIAGNOSIS — K3189 Other diseases of stomach and duodenum: Secondary | ICD-10-CM | POA: Diagnosis present

## 2022-09-20 DIAGNOSIS — Z79890 Hormone replacement therapy (postmenopausal): Secondary | ICD-10-CM

## 2022-09-20 DIAGNOSIS — Z950 Presence of cardiac pacemaker: Secondary | ICD-10-CM

## 2022-09-20 DIAGNOSIS — K7460 Unspecified cirrhosis of liver: Secondary | ICD-10-CM | POA: Diagnosis present

## 2022-09-20 DIAGNOSIS — K766 Portal hypertension: Secondary | ICD-10-CM | POA: Diagnosis present

## 2022-09-20 DIAGNOSIS — Z79899 Other long term (current) drug therapy: Secondary | ICD-10-CM

## 2022-09-20 DIAGNOSIS — I8510 Secondary esophageal varices without bleeding: Secondary | ICD-10-CM | POA: Diagnosis present

## 2022-09-20 DIAGNOSIS — Z7901 Long term (current) use of anticoagulants: Secondary | ICD-10-CM | POA: Diagnosis not present

## 2022-09-20 DIAGNOSIS — E1151 Type 2 diabetes mellitus with diabetic peripheral angiopathy without gangrene: Secondary | ICD-10-CM | POA: Diagnosis present

## 2022-09-20 DIAGNOSIS — E872 Acidosis, unspecified: Secondary | ICD-10-CM | POA: Diagnosis present

## 2022-09-20 DIAGNOSIS — E785 Hyperlipidemia, unspecified: Secondary | ICD-10-CM | POA: Diagnosis present

## 2022-09-20 DIAGNOSIS — R4182 Altered mental status, unspecified: Secondary | ICD-10-CM | POA: Diagnosis present

## 2022-09-20 DIAGNOSIS — K259 Gastric ulcer, unspecified as acute or chronic, without hemorrhage or perforation: Secondary | ICD-10-CM | POA: Diagnosis present

## 2022-09-20 DIAGNOSIS — Z8551 Personal history of malignant neoplasm of bladder: Secondary | ICD-10-CM

## 2022-09-20 DIAGNOSIS — D631 Anemia in chronic kidney disease: Secondary | ICD-10-CM | POA: Diagnosis present

## 2022-09-20 DIAGNOSIS — K922 Gastrointestinal hemorrhage, unspecified: Secondary | ICD-10-CM | POA: Diagnosis present

## 2022-09-20 DIAGNOSIS — K31811 Angiodysplasia of stomach and duodenum with bleeding: Secondary | ICD-10-CM | POA: Diagnosis present

## 2022-09-20 DIAGNOSIS — K2289 Other specified disease of esophagus: Secondary | ICD-10-CM | POA: Diagnosis present

## 2022-09-20 DIAGNOSIS — Z8673 Personal history of transient ischemic attack (TIA), and cerebral infarction without residual deficits: Secondary | ICD-10-CM

## 2022-09-20 DIAGNOSIS — I480 Paroxysmal atrial fibrillation: Secondary | ICD-10-CM | POA: Diagnosis present

## 2022-09-20 DIAGNOSIS — E1165 Type 2 diabetes mellitus with hyperglycemia: Secondary | ICD-10-CM | POA: Diagnosis present

## 2022-09-20 DIAGNOSIS — D62 Acute posthemorrhagic anemia: Secondary | ICD-10-CM

## 2022-09-20 DIAGNOSIS — K5731 Diverticulosis of large intestine without perforation or abscess with bleeding: Secondary | ICD-10-CM | POA: Diagnosis not present

## 2022-09-20 DIAGNOSIS — I10 Essential (primary) hypertension: Secondary | ICD-10-CM | POA: Diagnosis present

## 2022-09-20 DIAGNOSIS — E039 Hypothyroidism, unspecified: Secondary | ICD-10-CM | POA: Diagnosis present

## 2022-09-20 DIAGNOSIS — Z86010 Personal history of colonic polyps: Secondary | ICD-10-CM

## 2022-09-20 DIAGNOSIS — E1142 Type 2 diabetes mellitus with diabetic polyneuropathy: Secondary | ICD-10-CM | POA: Diagnosis present

## 2022-09-20 DIAGNOSIS — Z8719 Personal history of other diseases of the digestive system: Secondary | ICD-10-CM

## 2022-09-20 DIAGNOSIS — I2510 Atherosclerotic heart disease of native coronary artery without angina pectoris: Secondary | ICD-10-CM | POA: Diagnosis present

## 2022-09-20 LAB — PROTIME-INR
INR: 1.5 — ABNORMAL HIGH (ref 0.8–1.2)
Prothrombin Time: 18.1 s — ABNORMAL HIGH (ref 11.4–15.2)

## 2022-09-20 LAB — CBC WITH DIFFERENTIAL/PLATELET
Abs Immature Granulocytes: 0.11 10*3/uL — ABNORMAL HIGH (ref 0.00–0.07)
Basophils Absolute: 0.1 10*3/uL (ref 0.0–0.1)
Basophils Relative: 1 %
Eosinophils Absolute: 0.4 10*3/uL (ref 0.0–0.5)
Eosinophils Relative: 4 %
HCT: 27.6 % — ABNORMAL LOW (ref 39.0–52.0)
Hemoglobin: 8 g/dL — ABNORMAL LOW (ref 13.0–17.0)
Immature Granulocytes: 1 %
Lymphocytes Relative: 7 %
Lymphs Abs: 0.7 10*3/uL (ref 0.7–4.0)
MCH: 25.7 pg — ABNORMAL LOW (ref 26.0–34.0)
MCHC: 29 g/dL — ABNORMAL LOW (ref 30.0–36.0)
MCV: 88.7 fL (ref 80.0–100.0)
Monocytes Absolute: 1.1 10*3/uL — ABNORMAL HIGH (ref 0.1–1.0)
Monocytes Relative: 12 %
Neutro Abs: 7.4 10*3/uL (ref 1.7–7.7)
Neutrophils Relative %: 75 %
Platelets: 250 10*3/uL (ref 150–400)
RBC: 3.11 MIL/uL — ABNORMAL LOW (ref 4.22–5.81)
RDW: 23.2 % — ABNORMAL HIGH (ref 11.5–15.5)
WBC: 9.7 10*3/uL (ref 4.0–10.5)
nRBC: 0 % (ref 0.0–0.2)

## 2022-09-20 LAB — POC OCCULT BLOOD, ED: Fecal Occult Bld: POSITIVE — AB

## 2022-09-20 LAB — COMPREHENSIVE METABOLIC PANEL
ALT: 15 U/L (ref 0–44)
AST: 20 U/L (ref 15–41)
Albumin: 3.1 g/dL — ABNORMAL LOW (ref 3.5–5.0)
Alkaline Phosphatase: 153 U/L — ABNORMAL HIGH (ref 38–126)
Anion gap: 13 (ref 5–15)
BUN: 96 mg/dL — ABNORMAL HIGH (ref 8–23)
CO2: 23 mmol/L (ref 22–32)
Calcium: 8.8 mg/dL — ABNORMAL LOW (ref 8.9–10.3)
Chloride: 102 mmol/L (ref 98–111)
Creatinine, Ser: 4.06 mg/dL — ABNORMAL HIGH (ref 0.61–1.24)
GFR, Estimated: 14 mL/min — ABNORMAL LOW (ref >60)
Glucose, Bld: 141 mg/dL — ABNORMAL HIGH (ref 70–99)
Potassium: 4.3 mmol/L (ref 3.5–5.1)
Sodium: 138 mmol/L (ref 135–145)
Total Bilirubin: 1.6 mg/dL — ABNORMAL HIGH (ref 0.3–1.2)
Total Protein: 7.8 g/dL (ref 6.5–8.1)

## 2022-09-20 LAB — LACTIC ACID, PLASMA
Lactic Acid, Venous: 1.6 mmol/L (ref 0.5–1.9)
Lactic Acid, Venous: 5.4 mmol/L (ref 0.5–1.9)

## 2022-09-20 LAB — MRSA NEXT GEN BY PCR, NASAL: MRSA by PCR Next Gen: NOT DETECTED

## 2022-09-20 LAB — PREPARE RBC (CROSSMATCH)

## 2022-09-20 LAB — HEMOGLOBIN AND HEMATOCRIT, BLOOD
HCT: 22.6 % — ABNORMAL LOW (ref 39.0–52.0)
Hemoglobin: 6.5 g/dL — CL (ref 13.0–17.0)

## 2022-09-20 LAB — GLUCOSE, CAPILLARY: Glucose-Capillary: 120 mg/dL — ABNORMAL HIGH (ref 70–99)

## 2022-09-20 LAB — APTT: aPTT: 35 seconds (ref 24–36)

## 2022-09-20 MED ORDER — ACETAMINOPHEN 650 MG RE SUPP: 650.0000 mg | Freq: Four times a day (QID) | RECTAL | Status: DC | PRN

## 2022-09-20 MED ORDER — SODIUM CHLORIDE 0.9% IV SOLUTION: Freq: Once | INTRAVENOUS | Status: AC

## 2022-09-20 MED ORDER — SODIUM CHLORIDE 0.9 % IV SOLN: 2.0000 g | INTRAVENOUS | Status: DC

## 2022-09-20 MED ORDER — CARVEDILOL 3.125 MG PO TABS
3.1250 mg | ORAL_TABLET | Freq: Two times a day (BID) | ORAL | Status: DC
  Administered 2022-09-21 – 2022-09-23 (×4): 3.125 mg via ORAL
  Filled 2022-09-20 (×6): qty 1

## 2022-09-20 MED ORDER — PANTOPRAZOLE 80MG IVPB - SIMPLE MED
80.0000 mg | Freq: Once | INTRAVENOUS | Status: AC
  Administered 2022-09-20: 80 mg via INTRAVENOUS
  Filled 2022-09-20: qty 80

## 2022-09-20 MED ORDER — ONDANSETRON HCL 4 MG/2ML IJ SOLN: 4.0000 mg | Freq: Four times a day (QID) | INTRAMUSCULAR | Status: DC | PRN

## 2022-09-20 MED ORDER — CHLORHEXIDINE GLUCONATE CLOTH 2 % EX PADS
6.0000 | MEDICATED_PAD | Freq: Every day | CUTANEOUS | Status: DC
  Administered 2022-09-20 – 2022-09-22 (×3): 6 via TOPICAL

## 2022-09-20 MED ORDER — POTASSIUM CHLORIDE 10 MEQ/100ML IV SOLN: 10.0000 meq | Freq: Once | INTRAVENOUS | Status: DC

## 2022-09-20 MED ORDER — METRONIDAZOLE 500 MG/100ML IV SOLN
500.0000 mg | Freq: Two times a day (BID) | INTRAVENOUS | Status: DC
  Administered 2022-09-20 – 2022-09-21 (×2): 500 mg via INTRAVENOUS
  Filled 2022-09-20 (×2): qty 100

## 2022-09-20 MED ORDER — ACETAMINOPHEN 325 MG PO TABS: 650.0000 mg | ORAL_TABLET | Freq: Four times a day (QID) | ORAL | Status: DC | PRN

## 2022-09-20 MED ORDER — ONDANSETRON HCL 4 MG PO TABS: 4.0000 mg | ORAL_TABLET | Freq: Four times a day (QID) | ORAL | Status: DC | PRN

## 2022-09-20 MED ORDER — SODIUM CHLORIDE 0.9 % IV BOLUS
500.0000 mL | Freq: Once | INTRAVENOUS | Status: AC
  Administered 2022-09-20: 500 mL via INTRAVENOUS

## 2022-09-20 MED ORDER — SODIUM CHLORIDE 0.9 % IV SOLN
1.0000 g | Freq: Once | INTRAVENOUS | Status: AC
  Administered 2022-09-20: 1 g via INTRAVENOUS
  Filled 2022-09-20: qty 10

## 2022-09-20 MED ORDER — SODIUM CHLORIDE 0.9 % IV SOLN: INTRAVENOUS | Status: AC

## 2022-09-20 MED ORDER — PANTOPRAZOLE SODIUM 40 MG IV SOLR
40.0000 mg | INTRAVENOUS | Status: DC
  Administered 2022-09-21: 40 mg via INTRAVENOUS
  Filled 2022-09-20: qty 10

## 2022-09-20 MED ORDER — SODIUM CHLORIDE 0.9 % IV SOLN
2.0000 g | Freq: Once | INTRAVENOUS | Status: AC
  Administered 2022-09-20: 2 g via INTRAVENOUS
  Filled 2022-09-20: qty 12.5

## 2022-09-20 MED ORDER — SODIUM CHLORIDE 0.9 % IV SOLN: 1.0000 g | Freq: Once | INTRAVENOUS | Status: DC

## 2022-09-20 MED ORDER — SODIUM CHLORIDE 0.9 % IV SOLN: 1.0000 g | INTRAVENOUS | Status: DC

## 2022-09-20 NOTE — Consult Note (Signed)
Consultation Note   Referring Provider: [facility] PCP: Byrd Hesselbach, PA Primary Gastroenterologist: Dr. Rhea Belton       Reason for Consultation: Rectal bleeding DOA: 09/20/2022         Hospital Day: 1   ASSESSMENT & PLAN   82 y.o. year old male with medical history of CHF, CKD stage III, CAD, A-fib (prior MAZE and on Eliquis), PVD, diabetes, hypertension, hyperlipidemia, COPD, bladder cancer, cirrhosis (complicated by portal hypertension manifested as ascites), colon polyps.  The GI service is consulted for evaluation and management of acute on chronic anemia and rectal bleeding.  Acute on chronic anemia secondary to lower (painless) GI bleed. Positive fecal occult. On 8/4 we were consulted for anemia and heme positive stools. 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 VCE normal.  Prior EGD March 2023 showed gastritis with biopsies showing slight chronic inflammation and reactive changes, no H. pylori. Colonoscopy March 2023 showed 2 polyps and diverticulosis.  Discharged with Hgb 8.7. Today is 8.0. He is also anticoagulated w/ eliquis and began bleeding immediately after eliquis was restarted on Tuesday. Last dose of Eliquis was yesterday morning,skipped evening dose. He has h/o diverticulosis as well, must consider diverticular bleed. He has h/o cirrhosis and must consider variceal bleeding. He has no abd pain to indicate diverticulitis, ischemic colitis. Stable BP and HR. WBC 9.7. -NPO -order Tagged RBC scan to rule out diverticular bleed. -if scan is negative will proceed with endoscopy and flex sigmoidoscopy possibly tomorrow -Monitor H/H with transfusion as needed to maintain Hgb >7. Aunika Kirsten repeat sooner if needed.  CKD stage III - BUN 96  Creatinine 4.06  Chronic diastolic CHF, LV EF 55 - 60%, moderately reduced RV function per ECHO 03/21/2022. On Lasix 40mg  po every day.   Paroxysmal atrial fibrillation, Eliquis on hold secondary to GI bleed.  On Carvedilol.   Cirrhosis- Albumin  3.1, T. Bilirubin 1.6, Alk phos 153, AST-20, ALT- 15. EGD 8/11 for grade 1 esophageal varices and portal hypertensive gastropathy. Denies Hemoptysis. MELD 3.0: 24 at 09/20/2022  2:29 PM MELD-Na: 26 at 09/20/2022  2:29 PM Calculated from: Serum Creatinine: 4.06 mg/dL (Using max of 3 mg/dL) at 06/18/158  1:09 PM Serum Sodium: 138 mmol/L (Using max of 137 mmol/L) at 09/20/2022  2:29 PM Total Bilirubin: 1.6 mg/dL at 03/17/3555  3:22 PM Serum Albumin: 3.1 g/dL at 0/02/5425  0:62 PM INR(ratio): 1.5 at 09/20/2022  2:29 PM Age at listing (hypothetical): 51 years Sex: Male at 09/20/2022  2:29 PM Worsened by patient kidney function.   Additional co-morbidities include  : DM type 2   HISTORY OF PRESENT ILLNESS   Patient lying in bed, wife at bedside. Patient states he was feeling well up until yesterday where he was fatigued and slept more.  Patient states he restarted his Eliquis on Tuesday morning.  He took both his am and p.m. dose and a dose Wednesday morning.  He slept most of the day and skipped his p.m. dose.  Patient states he woke up early this morning to the restroom and noticed he  passed painless dark blood with clots from his rectum.  Patient states he is made about 8 trips to the restroom and passing blood only.  Patient denies nausea or vomiting. He denies coffee-ground emesis or hematemesis.  Patient denies abdominal pain.  No NSAID use.  Patient states he likely has at least 1 bowel movement per day that semiformed.  Denies history of constipation.  Patient has been taking pantoprazole 40 mg twice daily.  Patient has been taking sucralfate 1 g tablet twice daily.  Per patient's wife prescribed 4 times per day but it was causing stomach upset.  Previous GI  Evaluations   08/26/2022 Upper Endoscopy  Impression:  - No gross lesions in the proximal esophagus and in the mid esophagus. - Small grade I esophageal varices noted distally.  - Z- line irregular, 37 cm from the incisors.  - 4 cm hiatal hernia.  - Portal hypertensive gastropathy proximally.  - Erythematous mucosa in the stomach. Biopsied.  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 Treated with radiofrequency ablation.  - No gross lesions in the duodenal bulb, in the first portion of the duodenum and in the second portion of the duodenum.  - Successful completion of the Video Capsule Enteroscope placement into the bulb.  Capsule endoscopy report: shows no obvious SB lesions.  Study time extent limited due to retained SB creations.  Few small free-floating clots likely due to recent RFA.  No active bleeding.  LIMITED ABDOMEN ULTRASOUND FOR ASCITES  05/2022  IMPRESSION: Mild ascites in the right and left lower quadrants with small pockets.  Echo 2D (03/2022)  Left ventricular ejection fraction, by estimation, is 55 to 60%  Colonoscopy 03/2021 - Two 2 to 3 mm polyps in the transverse colon, removed with a cold snare. Complete resection. Partial retrieval.  - Diverticulosis in the entire examined colon.  Labs and Imaging: Recent Labs    09/20/22 1429  WBC 9.7  HGB 8.0*  HCT 27.6*  PLT 250   Recent Labs    09/20/22 1429  NA 138  K 4.3  CL 102  CO2 23  GLUCOSE 141*  BUN 96*  CREATININE 4.06*  CALCIUM 8.8*   Recent Labs    09/20/22 1429  PROT 7.8  ALBUMIN 3.1*  AST 20  ALT 15  ALKPHOS 153*  BILITOT 1.6*   No results for input(s): "HEPBSAG", "HCVAB", "HEPAIGM", "HEPBIGM" in the last 72 hours. Recent Labs    09/20/22 1429  LABPROT 18.1*  INR 1.5*      Past Medical History:  Diagnosis Date   Acute on chronic diastolic heart failure (HCC) 03/20/2022   Anemia    ?   Carotid artery occlusion    Chronic diastolic  CHF (congestive heart failure) (HCC) 04/23/2012   CKD (chronic kidney disease), stage III (HCC)    Claudication (HCC)    Coronary artery disease    s/p CABG 2013   Diabetic neuropathy (HCC)    mostly feet/ legs   Diverticulosis    Dyslipidemia    Exertional shortness of breath    Fracture of one rib, left side, initial encounter for closed fracture 02/01/2021   Gastritis and gastroduodenitis    GERD (gastroesophageal reflux disease)    GI bleed    while on triple anticoag therapy   HTN (hypertension)    Pacemaker    PAF (paroxysmal atrial fibrillation) (HCC)    s/p MAZE at time of CABG   PVD (peripheral  vascular disease) (HCC)    R CEA 2011, prior PTA, left fem-pop 2014   Stroke (HCC)    vs TIA   Tubular adenoma of colon    Type II diabetes mellitus (HCC)     Past Surgical History:  Procedure Laterality Date   ABDOMINAL AORTAGRAM N/A 01/07/2012   Procedure: ABDOMINAL Ronny Flurry;  Surgeon: Chuck Hint, MD;  Location: [facility] CATH LAB;  Service: Cardiovascular;  Laterality: N/A;   BALLOON DILATION N/A 02/17/2013   Procedure: BALLOON DILATION;  Surgeon: Beverley Fiedler, MD;  Location: [facility] ENDOSCOPY;  Service: Gastroenterology;  Laterality: N/A;   BIOPSY  03/17/2021   Procedure: BIOPSY;  Surgeon: Iva Boop, MD;  Location: [facility] ENDOSCOPY;  Service: Gastroenterology;;   BIOPSY  08/26/2022   Procedure: BIOPSY;  Surgeon: Lemar Lofty., MD;  Location: [facility] ENDOSCOPY;  Service: Gastroenterology;;   CAROTID ENDARTERECTOMY Right 03/2009   CATARACT EXTRACTION Bilateral    CHOLECYSTECTOMY     CIRCUMCISION     COLONOSCOPY WITH PROPOFOL N/A 02/17/2013   Procedure: COLONOSCOPY WITH PROPOFOL;  Surgeon: Beverley Fiedler, MD;  Location: [facility] ENDOSCOPY;  Service: Gastroenterology;  Laterality: N/A;   COLONOSCOPY WITH PROPOFOL N/A 03/18/2021   Procedure: COLONOSCOPY WITH PROPOFOL;  Surgeon: Jeani Hawking, MD;  Location: [facility] ENDOSCOPY;  Service: Gastroenterology;  Laterality: N/A;   CORONARY ARTERY  BYPASS GRAFT  06/14/2011   Procedure: CORONARY ARTERY BYPASS GRAFTING (CABG);  Surgeon: Alleen Borne, MD;  Location[facility] OR;  Service: Open Heart Surgery;  Laterality: N/A;  Coronary Artery Bypass Graft on pump times six;  utilizing internal mammary artery and right greater saphenous vein harvested endoscopically.x 5 vessels   ELECTROPHYSIOLOGY STUDY N/A 11/05/2012   Procedure: ELECTROPHYSIOLOGY STUDY;  Surgeon: Duke Salvia, MD;  Location: [facility] CATH LAB;  Service: Cardiovascular;  Laterality: N/A;   ESOPHAGOGASTRODUODENOSCOPY N/A 08/26/2022   Procedure: ESOPHAGOGASTRODUODENOSCOPY (EGD);  Surgeon: Lemar Lofty., MD;  Location: [facility] ENDOSCOPY;  Service: Gastroenterology;  Laterality: N/A;   ESOPHAGOGASTRODUODENOSCOPY (EGD) WITH PROPOFOL N/A 02/17/2013   Procedure: ESOPHAGOGASTRODUODENOSCOPY (EGD) WITH PROPOFOL;  Surgeon: Beverley Fiedler, MD;  Location: [facility] ENDOSCOPY;  Service: Gastroenterology;  Laterality: N/A;   ESOPHAGOGASTRODUODENOSCOPY (EGD) WITH PROPOFOL N/A 03/17/2021   Procedure: ESOPHAGOGASTRODUODENOSCOPY (EGD) WITH PROPOFOL;  Surgeon: Iva Boop, MD;  Location: [facility] ENDOSCOPY;  Service: Gastroenterology;  Laterality: N/A;   FEMORAL-POPLITEAL BYPASS GRAFT Left 03/06/2012   Procedure: BYPASS GRAFT FEMORAL-POPLITEAL ARTERY;  Surgeon: Chuck Hint, MD;  Location: [facility] OR;  Service: Vascular;  Laterality: Left;  Left Femoral - Below Knee Popliteal Bypass Graft with Vein and Intraoperative Arteriogram.   GI RADIOFREQUENCY ABLATION  08/26/2022   Procedure: GI RADIOFREQUENCY ABLATION;  Surgeon: Lemar Lofty., MD;  Location: [facility] ENDOSCOPY;  Service: Gastroenterology;;   GIVENS CAPSULE STUDY N/A 08/26/2022   Procedure: GIVENS CAPSULE STUDY;  Surgeon: Lemar Lofty., MD;  Location: [facility] ENDOSCOPY;  Service: Gastroenterology;  Laterality: N/A;   IR PARACENTESIS  05/23/2022   JOINT REPLACEMENT Left    left TKA   KNEE ARTHROSCOPY Left    "I had 3" (11/05/2012)   LEAD REVISION   11/05/2012   pacemaker/notes 11/05/2012   LEAD REVISION N/A 11/05/2012   Procedure: LEAD REVISION;  Surgeon: Duke Salvia, MD;  Location: [facility] CATH LAB;  Service: Cardiovascular;  Laterality: N/A;   MAZE  06/14/2011   Procedure: MAZE;  Surgeon: Alleen Borne, MD;  Location: [facility] OR;  Service: Open Heart Surgery;  Laterality: N/A;  Ligate left atrial appendage  PERMANENT PACEMAKER INSERTION N/A 06/20/2011   Procedure: PERMANENT PACEMAKER INSERTION;  Surgeon: Duke Salvia, MD;  Location: [facility] CATH LAB;  Service: Cardiovascular;  Laterality: N/A;   POLYPECTOMY  03/18/2021   Procedure: POLYPECTOMY;  Surgeon: Jeani Hawking, MD;  Location: [facility] ENDOSCOPY;  Service: Gastroenterology;;   TONSILLECTOMY AND ADENOIDECTOMY     TOTAL KNEE ARTHROPLASTY Left     Family History  Problem Relation Age of Onset   Coronary artery disease Father    Hypertension Father    Heart disease Father        Heart Disease before age 80   Heart attack Mother    Coronary artery disease Mother    Deep vein thrombosis Mother    Heart disease Mother        Heart Disease before age 55   Hyperlipidemia Mother    Hypertension Mother    Hypertension Sister    Heart disease Sister        Heart Disease before age 55   Hyperlipidemia Sister    Heart disease Brother        Heart Disease before age 84   Colon cancer Brother 1   Anesthesia problems Neg Hx    Diabetes Neg Hx     Prior to Admission medications   Medication Sig Start Date End Date Taking? Authorizing Provider  acetaminophen (TYLENOL) 650 MG CR tablet Take 100 mg by mouth daily.    [provider]  allopurinol (ZYLOPRIM) 100 MG tablet Take 100 mg by mouth daily.    [provider]  carvedilol (COREG) 12.5 MG tablet Take 6.25 mg by mouth 2 (two) times daily with a meal.    [provider]  cholecalciferol (VITAMIN D3) 25 MCG (1000 UNIT) tablet Take 2,000 Units by mouth daily.    [provider]  cyanocobalamin 1000 MCG tablet  Take 1,000 mcg by mouth daily.    [provider]  furosemide (LASIX) 20 MG tablet Take 40-60 mg by mouth See admin instructions. Takes 60 mg in the morning and 40 mg at night    [provider]  insulin glargine (LANTUS) 100 UNIT/ML injection Inject 48 Units into the skin in the morning.    [provider]  isosorbide mononitrate (IMDUR) 30 MG 24 hr tablet Take 30 mg by mouth daily.    [provider]  levothyroxine (SYNTHROID) 25 MCG tablet Take 25 mcg by mouth daily before breakfast.    [provider]  magnesium oxide (MAG-OX) 400 (240 Mg) MG tablet Take 2 tablets by mouth daily.    [provider]  Multiple Vitamin (MULTIVITAMIN) capsule Take 1 capsule by mouth daily.    [provider]  ondansetron (ZOFRAN) 4 MG tablet Take 1 tablet (4 mg total) by mouth every 6 (six) hours as needed for nausea. 08/28/22   [name], DO  pantoprazole (PROTONIX) 40 MG tablet Take 1 tablet (40 mg total) by mouth 2 (two) times daily. 08/28/22 10/27/22  [name], DO  Propylene Glycol (SYSTANE BALANCE) 0.6 % SOLN Place 1 drop into both eyes daily as needed.    [provider]  Semaglutide, 1 MG/DOSE, 2 MG/1.5ML SOPN Inject 1 mg into the skin every Sunday.    [provider]  spironolactone (ALDACTONE) 25 MG tablet Take 1 tablet (25 mg total) by mouth daily. 05/26/22   Masters, Katie, DO  sucralfate (CARAFATE) 1 g tablet Take 1 tablet (1 g total) by mouth 4 (four) times daily -  with meals and at bedtime. Take for a Month and then take BID 08/28/22   [name], DO  testosterone cypionate (DEPOTESTOSTERONE CYPIONATE) 200 MG/ML injection Inject 200 mg into the muscle every 14 (fourteen) days. Given every other Sunday    [provider]  Zinc 50 MG TABS Take 1 tablet (50 mg total) by mouth daily. 03/25/22   [name], MD    Current Facility-Administered Medications  Medication Dose Route Frequency  Provider Last Rate Last Admin   cefTRIAXone (ROCEPHIN) 1 g in sodium chloride 0.9 % 100 mL IVPB  1 g Intravenous Once Loetta Rough, MD 200 mL/hr at 09/20/22 1505 1 g at 09/20/22 1505   sodium chloride 0.9 % bolus 500 mL  500 mL Intravenous Once Bethann Berkshire, MD       Current Outpatient Medications  Medication Sig Dispense Refill   acetaminophen (TYLENOL) 650 MG CR tablet Take 100 mg by mouth daily.     allopurinol (ZYLOPRIM) 100 MG tablet Take 100 mg by mouth daily.     carvedilol (COREG) 12.5 MG tablet Take 6.25 mg by mouth 2 (two) times daily with a meal.     cholecalciferol (VITAMIN D3) 25 MCG (1000 UNIT) tablet Take 2,000 Units by mouth daily.     cyanocobalamin 1000 MCG tablet Take 1,000 mcg by mouth daily.     furosemide (LASIX) 20 MG tablet Take 40-60 mg by mouth See admin instructions. Takes 60 mg in the morning and 40 mg at night     insulin glargine (LANTUS) 100 UNIT/ML injection Inject 48 Units into the skin in the morning.     isosorbide mononitrate (IMDUR) 30 MG 24 hr tablet Take 30 mg by mouth daily.     levothyroxine (SYNTHROID) 25 MCG tablet Take 25 mcg by mouth daily before breakfast.     magnesium oxide (MAG-OX) 400 (240 Mg) MG tablet Take 2 tablets by mouth daily.     Multiple Vitamin (MULTIVITAMIN) capsule Take 1 capsule by mouth daily.     ondansetron (ZOFRAN) 4 MG tablet Take 1 tablet (4 mg total) by mouth every 6 (six) hours as needed for nausea. 20 tablet 0   pantoprazole (PROTONIX) 40 MG tablet Take 1 tablet (40 mg total) by mouth 2 (two) times daily. 60 tablet 1   Propylene Glycol (SYSTANE BALANCE) 0.6 % SOLN Place 1 drop into both eyes daily as needed.     Semaglutide, 1 MG/DOSE, 2 MG/1.5ML SOPN Inject 1 mg into the skin every Sunday.     spironolactone (ALDACTONE) 25 MG tablet Take 1 tablet (25 mg total) by mouth daily. 30 tablet 0   sucralfate (CARAFATE) 1 g tablet Take 1 tablet (1 g total) by mouth 4 (four) times daily -  with meals and at bedtime. Take for a  Month and then take BID 120 tablet 1   testosterone cypionate (DEPOTESTOSTERONE CYPIONATE) 200 MG/ML injection Inject 200 mg into the muscle every 14 (fourteen) days. Given every other Sunday     Zinc 50 MG TABS Take 1 tablet (50 mg total) by mouth daily. 30 tablet 0    Allergies as of 09/20/2022 - Review Complete 09/20/2022  Allergen Reaction Noted   Lipitor [atorvastatin calcium] Other (See Comments) 05/30/2010    Social History   Socioeconomic History   Marital status: Married    Spouse name: Not on file   Number of children: Not on file   Years of education: Not on file   Highest education level:  Not on file  Occupational History   Occupation: retired  Tobacco Use   Smoking status: Former    Current packs/day: 0.00    Average packs/day: 3.0 packs/day for 32.0 years (96.0 ttl pk-yrs)    Types: Cigarettes    Start date: 01/16/1947    Quit date: 01/16/1979    Years since quitting: 43.7   Smokeless tobacco: Never  Vaping Use   Vaping status: Never Used  Substance and Sexual Activity   Alcohol use: Yes    Alcohol/week: 2.0 standard drinks of alcohol    Types: 2 Glasses of wine per week    Comment: 1 glass of champagne with dinner, occasional beer   Drug use: No   Sexual activity: Not Currently  Other Topics Concern   Not on file  Social History Narrative   Not on file   Social Determinants of Health   Financial Resource Strain: Not on file  Food Insecurity: No Food Insecurity (08/25/2022)   Hunger Vital Sign    Worried About Running Out of Food in the Last Year: Never true    Ran Out of Food in the Last Year: Never true  Transportation Needs: No Transportation Needs (08/25/2022)   PRAPARE - Administrator, Civil Service (Medical): No    Lack of Transportation (Non-Medical): No  Physical Activity: Not on file  Stress: Not on file  Social Connections: Unknown (05/30/2021)   Received from [facility], [facility]   Social Network    Social Network: Not  on file  Intimate Partner Violence: Not At Risk (08/25/2022)   Humiliation, Afraid, Rape, and Kick questionnaire    Fear of Current or Ex-Partner: No    Emotionally Abused: No    Physically Abused: No    Sexually Abused: No     Code Status   Code Status: Prior  Review of Systems: All systems reviewed and negative except where noted in HPI.  Physical Exam: Vital signs in last 24 hours: Temp:  [97.5 F (36.4 C)] 97.5 F (36.4 C) (09/05 1348) Pulse Rate:  [68] 68 (09/05 1349) Resp:  [17] 17 (09/05 1349) BP: (153)/(68) 153/68 (09/05 1349) SpO2:  [99 %] 99 % (09/05 1349) Weight:  [91 kg] 91 kg (09/05 1348)    General:  Pleasant male in NAD Psych:  Cooperative. Normal mood and affect Eyes: Pupils equal Ears:  Normal auditory acuity Nose: No deformity, discharge or lesions Neck:  Supple, no masses felt Lungs:  Clear to auscultation, RA Heart:  Regular rate, regular rhythm.  Abdomen:  Soft, nondistended, nontender, hypoactive bowel sounds, no masses felt Rectal :  Deferred, blood noted on bed pad Msk: Symmetrical without gross deformities.  Neurologic:  Alert, oriented, grossly normal neurologically Extremities : +2 BLE Skin:  Intact without significant lesions.   Intake/Output from previous day: No intake/output data recorded. Intake/Output this shift:  No intake/output data recorded.  Active Problems:   * No active hospital problems. *    Valerian Jewel NP-C @  09/20/2022, 3:29 PM

## 2022-09-20 NOTE — ED Notes (Signed)
ED TO INPATIENT HANDOFF REPORT  ED Nurse Name and Phone #: Raynelle Fanning, RN  S Name/Age/Gender Edward [name] 82 y.o. male Room/Bed: WA03/WA03  Code Status   Code Status: Full Code  Home/SNF/Other Skilled nursing facility Patient oriented to: self, place, time, and situation Is this baseline? Yes   Triage Complete: Triage complete  Chief Complaint Rectal bleeding [K62.5]  Triage Note Patient has had rectal bleeding since this morning. Patient described it as burgundy in color and "coming out in chunks." No abdominal pain. Feels fatigue.    Allergies Allergies  Allergen Reactions   Lipitor [Atorvastatin Calcium] Other (See Comments)    Weakness/ pain in legs    Level of Care/Admitting Diagnosis ED Disposition     ED Disposition  Admit   Condition  --   Comment  Hospital Area: [facility] [[id number]]  Level of Care: Stepdown [14]  Admit to SDU based on following criteria: Hemodynamic compromise or significant risk of instability:  Patient requiring short term acute titration and management of vasoactive drips, and invasive monitoring (i.e., CVP and Arterial line).  May place patient in observation at [facility] or [facility] if equivalent level of care is available:: No  Covid Evaluation: Asymptomatic - no recent exposure (last 10 days) testing not required  Diagnosis: Rectal bleeding [217577]  Admitting Physician: Bobette Mo [[id number]]  Attending Physician: Bobette Mo [[id number]]          B Medical/Surgery History Past Medical History:  Diagnosis Date   Acute on chronic diastolic heart failure (HCC) 03/20/2022   Anemia    ?   Carotid artery occlusion    Chronic diastolic CHF (congestive heart failure) (HCC) 04/23/2012   CKD (chronic kidney disease), stage III (HCC)    Claudication (HCC)    Coronary artery disease    s/p CABG 2013   Diabetic neuropathy (HCC)    mostly feet/ legs   Diverticulosis    Dyslipidemia     Exertional shortness of breath    Fracture of one rib, left side, initial encounter for closed fracture 02/01/2021   Gastritis and gastroduodenitis    GERD (gastroesophageal reflux disease)    GI bleed    while on triple anticoag therapy   HTN (hypertension)    Pacemaker    PAF (paroxysmal atrial fibrillation) (HCC)    s/p MAZE at time of CABG   PVD (peripheral vascular disease) (HCC)    R CEA 2011, prior PTA, left fem-pop 2014   Stroke (HCC)    vs TIA   Tubular adenoma of colon    Type II diabetes mellitus (HCC)    Past Surgical History:  Procedure Laterality Date   ABDOMINAL AORTAGRAM N/A 01/07/2012   Procedure: ABDOMINAL Ronny Flurry;  Surgeon: Chuck Hint, MD;  Location: [facility] CATH LAB;  Service: Cardiovascular;  Laterality: N/A;   BALLOON DILATION N/A 02/17/2013   Procedure: BALLOON DILATION;  Surgeon: Beverley Fiedler, MD;  Location: [facility] ENDOSCOPY;  Service: Gastroenterology;  Laterality: N/A;   BIOPSY  03/17/2021   Procedure: BIOPSY;  Surgeon: Iva Boop, MD;  Location: [facility] ENDOSCOPY;  Service: Gastroenterology;;   BIOPSY  08/26/2022   Procedure: BIOPSY;  Surgeon: Lemar Lofty., MD;  Location: [facility] ENDOSCOPY;  Service: Gastroenterology;;   CAROTID ENDARTERECTOMY Right 03/2009   CATARACT EXTRACTION Bilateral    CHOLECYSTECTOMY     CIRCUMCISION     COLONOSCOPY WITH PROPOFOL N/A 02/17/2013   Procedure: COLONOSCOPY WITH PROPOFOL;  Surgeon: Beverley Fiedler, MD;  Location: [facility] ENDOSCOPY;  Service: Gastroenterology;  Laterality: N/A;   COLONOSCOPY WITH PROPOFOL N/A 03/18/2021   Procedure: COLONOSCOPY WITH PROPOFOL;  Surgeon: Jeani Hawking, MD;  Location: [facility] ENDOSCOPY;  Service: Gastroenterology;  Laterality: N/A;   CORONARY ARTERY BYPASS GRAFT  06/14/2011   Procedure: CORONARY ARTERY BYPASS GRAFTING (CABG);  Surgeon: Alleen Borne, MD;  Location: [facility] OR;  Service: Open Heart Surgery;  Laterality: N/A;  Coronary Artery Bypass Graft on pump times six;  utilizing internal mammary artery  and right greater saphenous vein harvested endoscopically.x 5 vessels   ELECTROPHYSIOLOGY STUDY N/A 11/05/2012   Procedure: ELECTROPHYSIOLOGY STUDY;  Surgeon: Duke Salvia, MD;  Location: [facility] CATH LAB;  Service: Cardiovascular;  Laterality: N/A;   ESOPHAGOGASTRODUODENOSCOPY N/A 08/26/2022   Procedure: ESOPHAGOGASTRODUODENOSCOPY (EGD);  Surgeon: Lemar Lofty., MD;  Location: [facility] ENDOSCOPY;  Service: Gastroenterology;  Laterality: N/A;   ESOPHAGOGASTRODUODENOSCOPY (EGD) WITH PROPOFOL N/A 02/17/2013   Procedure: ESOPHAGOGASTRODUODENOSCOPY (EGD) WITH PROPOFOL;  Surgeon: Beverley Fiedler, MD;  Location: [facility] ENDOSCOPY;  Service: Gastroenterology;  Laterality: N/A;   ESOPHAGOGASTRODUODENOSCOPY (EGD) WITH PROPOFOL N/A 03/17/2021   Procedure: ESOPHAGOGASTRODUODENOSCOPY (EGD) WITH PROPOFOL;  Surgeon: Iva Boop, MD;  Location: [facility] ENDOSCOPY;  Service: Gastroenterology;  Laterality: N/A;   FEMORAL-POPLITEAL BYPASS GRAFT Left 03/06/2012   Procedure: BYPASS GRAFT FEMORAL-POPLITEAL ARTERY;  Surgeon: Chuck Hint, MD;  Location: [facility] OR;  Service: Vascular;  Laterality: Left;  Left Femoral - Below Knee Popliteal Bypass Graft with Vein and Intraoperative Arteriogram.   GI RADIOFREQUENCY ABLATION  08/26/2022   Procedure: GI RADIOFREQUENCY ABLATION;  Surgeon: Lemar Lofty., MD;  Location: [facility] ENDOSCOPY;  Service: Gastroenterology;;   GIVENS CAPSULE STUDY N/A 08/26/2022   Procedure: GIVENS CAPSULE STUDY;  Surgeon: Lemar Lofty., MD;  Location: [facility] ENDOSCOPY;  Service: Gastroenterology;  Laterality: N/A;   IR PARACENTESIS  05/23/2022   JOINT REPLACEMENT Left    left TKA   KNEE ARTHROSCOPY Left    "I had 3" (11/05/2012)   LEAD REVISION  11/05/2012   pacemaker/notes 11/05/2012   LEAD REVISION N/A 11/05/2012   Procedure: LEAD REVISION;  Surgeon: Duke Salvia, MD;  Location: [facility] CATH LAB;  Service: Cardiovascular;  Laterality: N/A;   MAZE  06/14/2011   Procedure: MAZE;  Surgeon: Alleen Borne, MD;  Location: [facility] OR;  Service: Open Heart Surgery;  Laterality: N/A;  Ligate left atrial appendage   PERMANENT PACEMAKER INSERTION N/A 06/20/2011   Procedure: PERMANENT PACEMAKER INSERTION;  Surgeon: Duke Salvia, MD;  Location: [facility] CATH LAB;  Service: Cardiovascular;  Laterality: N/A;   POLYPECTOMY  03/18/2021   Procedure: POLYPECTOMY;  Surgeon: Jeani Hawking, MD;  Location: [facility] ENDOSCOPY;  Service: Gastroenterology;;   TONSILLECTOMY AND ADENOIDECTOMY     TOTAL KNEE ARTHROPLASTY Left      A IV Location/Drains/Wounds Patient Lines/Drains/Airways Status     Active Line/Drains/Airways     Name Placement date Placement time Site Days   Peripheral IV 09/20/22 20 G 1" Right Antecubital 09/20/22  1500  Antecubital  less than 1   Peripheral IV 09/20/22 18 G 1" Anterior;Distal;Left;Upper Arm 09/20/22  1506  Arm  less than 1   Wound / Incision (Open or Dehisced) 03/18/21 Skin tear Arm Posterior;Right;Upper 03/18/21  1940  Arm  551            Intake/Output Last 24 hours  Intake/Output Summary (Last 24 hours) at 09/20/2022 1921 Last data filed at 09/20/2022 1551 Gross per 24 hour  Intake 200 ml  Output --  Net 200 ml    Labs/Imaging Results for orders placed or performed during the hospital encounter of 09/20/22 (from the past 48 hour(s))  CBC with Differential     Status: Abnormal   Collection Time: 09/20/22  2:29 PM  Result Value Ref Range   WBC 9.7 4.0 - 10.5 K/uL   RBC 3.11 (L) 4.22 - 5.81 MIL/uL   Hemoglobin 8.0 (L) 13.0 - 17.0 g/dL   HCT 86.5 (L) 78.4 - 69.6 %   MCV 88.7 80.0 - 100.0 fL   MCH 25.7 (L) 26.0 - 34.0 pg   MCHC 29.0 (L) 30.0 - 36.0 g/dL   RDW 29.5 (H) 28.4 - 13.2 %   Platelets 250 150 - 400 K/uL   nRBC 0.0 0.0 - 0.2 %   Neutrophils Relative % 75 %   Neutro Abs 7.4 1.7 - 7.7 K/uL   Lymphocytes Relative 7 %   Lymphs Abs 0.7 0.7 - 4.0 K/uL   Monocytes Relative 12 %   Monocytes Absolute 1.1 (H) 0.1 - 1.0 K/uL   Eosinophils Relative 4 %   Eosinophils  Absolute 0.4 0.0 - 0.5 K/uL   Basophils Relative 1 %   Basophils Absolute 0.1 0.0 - 0.1 K/uL   Immature Granulocytes 1 %   Abs Immature Granulocytes 0.11 (H) 0.00 - 0.07 K/uL   Schistocytes PRESENT    Tear Drop Cells PRESENT    Polychromasia PRESENT    Ovalocytes PRESENT     Comment: Performed at [facility], [address], [place], [place] [address]  Comprehensive metabolic panel     Status: Abnormal   Collection Time: 09/20/22  2:29 PM  Result Value Ref Range   Sodium 138 135 - 145 mmol/L   Potassium 4.3 3.5 - 5.1 mmol/L   Chloride 102 98 - 111 mmol/L   CO2 23 22 - 32 mmol/L   Glucose, Bld 141 (H) 70 - 99 mg/dL    Comment: Glucose reference range applies only to samples taken after fasting for at least 8 hours.   BUN 96 (H) 8 - 23 mg/dL   Creatinine, Ser 2.72 (H) 0.61 - 1.24 mg/dL   Calcium 8.8 (L) 8.9 - 10.3 mg/dL   Total Protein 7.8 6.5 - 8.1 g/dL   Albumin 3.1 (L) 3.5 - 5.0 g/dL   AST 20 15 - 41 U/L   ALT 15 0 - 44 U/L   Alkaline Phosphatase 153 (H) 38 - 126 U/L   Total Bilirubin 1.6 (H) 0.3 - 1.2 mg/dL   GFR, Estimated 14 (L) >60 mL/min    Comment: (NOTE) Calculated using the CKD-EPI Creatinine Equation (2021)    Anion gap 13 5 - 15    Comment: Performed at [facility], [address] [place], [place] [address]  Protime-INR     Status: Abnormal   Collection Time: 09/20/22  2:29 PM  Result Value Ref Range   Prothrombin Time 18.1 (H) 11.4 - 15.2 seconds   INR 1.5 (H) 0.8 - 1.2    Comment: (NOTE) INR goal varies based on device and disease states. Performed at [facility], [address], [place], [place] [address]   APTT     Status: None   Collection Time: 09/20/22  2:29 PM  Result Value Ref Range   aPTT 35 24 - 36 seconds    Comment: Performed at [facility], [address] [place], [place] [address]  Type and screen [facility]  Status: None   Collection  Time: 09/20/22  2:29 PM  Result Value Ref Range   ABO/RH(D) O NEG    Antibody Screen NEG    Sample Expiration      09/23/2022,2359 Performed at [facility], [address], [place], [place] [address]   Lactic acid, plasma     Status: None   Collection Time: 09/20/22  3:04 PM  Result Value Ref Range   Lactic Acid, Venous 1.6 0.5 - 1.9 mmol/L    Comment: Performed at [facility], [address], [place], [place] [address]  POC occult blood, ED RN will collect     Status: Abnormal   Collection Time: 09/20/22  3:19 PM  Result Value Ref Range   Fecal Occult Bld POSITIVE (A) NEGATIVE   No results found.  Pending Labs Unresulted Labs (From admission, onward)     Start     Ordered   09/21/22 0500  CBC  Tomorrow morning,   R        09/20/22 1619   09/21/22 0500  Comprehensive metabolic panel  Tomorrow morning,   R        09/20/22 1619   09/20/22 2300  Hemoglobin and hematocrit, blood  Once-Timed,   TIMED        09/20/22 1908   09/20/22 1830  Hemoglobin and hematocrit, blood  Once-Timed,   TIMED        09/20/22 1617   09/20/22 1354  Lactic acid, plasma  (Lactic Acid)  Now then every 2 hours,   R (with STAT occurrences)      09/20/22 1353            Vitals/Pain Today's Vitals   09/20/22 1348 09/20/22 1349 09/20/22 1821 09/20/22 1900  BP:  (!) 153/68  121/61  Pulse:  68  81  Resp: 17 17  (!) 9  Temp: (!) 97.5 F (36.4 C)  (!) 97.5 F (36.4 C)   TempSrc: Oral  Oral   SpO2:  99%  100%  Weight: 91 kg     Height: 5\' 10"  (1.778 m)     PainSc: 0-No pain       Isolation Precautions No active isolations  Medications Medications  0.9 %  sodium chloride infusion (has no administration in time range)  acetaminophen (TYLENOL) tablet 650 mg (has no administration in time range)    Or  acetaminophen (TYLENOL) suppository 650 mg (has no administration in time range)  ondansetron (ZOFRAN) tablet 4 mg (has no administration in time range)     Or  ondansetron (ZOFRAN) injection 4 mg (has no administration in time range)  cefTRIAXone (ROCEPHIN) 1 g in sodium chloride 0.9 % 100 mL IVPB (has no administration in time range)  carvedilol (COREG) tablet 3.125 mg (has no administration in time range)  pantoprazole (PROTONIX) injection 40 mg (has no administration in time range)  pantoprazole (PROTONIX) 80 mg /NS 100 mL IVPB (0 mg Intravenous Stopped 09/20/22 1551)  cefTRIAXone (ROCEPHIN) 1 g in sodium chloride 0.9 % 100 mL IVPB (0 g Intravenous Stopped 09/20/22 1551)  sodium chloride 0.9 % bolus 500 mL (500 mLs Intravenous New Bag/Given 09/20/22 1552)    Mobility walks with device     Focused Assessments Cardiac Assessment Handoff:    No results found for: "CKTOTAL", "CKMB", "CKMBINDEX", "TROPONINI" Lab Results  Component Value Date   DDIMER 0.60 (H) 02/12/2017   Does the Patient currently have chest pain? No    R Recommendations:  See Admitting Provider Note  Report given to:   Additional Notes: Pt is being admitted for rectal bleed

## 2022-09-20 NOTE — ED Provider Notes (Signed)
Patient with GI bleed.  [facility] will see the patient in consult and the hospitalist is admitting   Bethann Berkshire, MD 09/20/22 [phone]

## 2022-09-20 NOTE — ED Notes (Signed)
Pt. Soiled brief with large red black blood. Pt. Cleaned up with peri-cleanse on washcloth and dryed. Pt. New brief and chucks applied.

## 2022-09-20 NOTE — ED Triage Notes (Addendum)
Patient has had rectal bleeding since this morning. Patient described it as burgundy in color and "coming out in chunks." No abdominal pain. Feels fatigue.

## 2022-09-20 NOTE — Progress Notes (Signed)
Provider and charge nurse contacted about pt's hypotension and blood loss

## 2022-09-20 NOTE — Progress Notes (Addendum)
    Patient Name: Edward [name]           DOB: 19-Oct-1940  MRN: [id number]      Admission Date: 09/20/2022  Attending Provider: Bobette Mo, MD  Primary Diagnosis: Rectal bleeding   Level of care: Stepdown    CROSS COVER NOTE   Date of Service   09/20/2022   Edward [name], 82 y.o. male, was admitted on 09/20/2022 for Rectal bleeding.    HPI/Events of Note   Bedside RN reporting hypotension and elevated lactic acid  Symptomatic anemia secondary to painless lower GI bleed- last dose of Eliquis was on 9/4 morning Hypotension, SBP 80-90s All other vitals WNL, afebrile.  Patient endorses feeling fatigued and weak.  He is A/O x 4. RN reports patient had 2 large bright red rectal bleeds in the ED.  Since being in the ICU, he has had an additional 2 episodes of bleeding. Last bleeding episode was much smaller in volume. Hgb dropped from 8.0 --> 6.5. Will transfuse 2 units PRBC.  While waiting for blood, RN to administer 500 cc bolus for hypotension. GI consult appreciated.  Recommendation made for tagged RBC scan to rule out diverticular bleed. Considered CTA GI bleed only if patient starts to bleed profusely.  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 Creatine 4.06. I spoke with family and patient regarding this plan.  Patient understands that if CTA is needed, IV contrast could further damage kidneys.  Patient and family are agreeable to holding off on CTA for now. Full code status was confirmed with patient and spouse bedside.   Elevated lactic acid  Lactic acid 1.6 --> 5.4.  Consider hypovolemia shock from blood loss, infection from potential SBP. Afebrile, hypotensive, WBC 9.7.  Was started on ceftriaxone for SBP prophylaxis.  Antibiotics have been broadened to cefepime and Flagyl. Repeat lactic acid.   Addendum 0600-  BP stable.  Nursing staff reports patient is still having rectal bleeds, but small in volume.  Also having intermittent confusion.  Upon  assessment he is alert and oriented to self, place, and situation.  No focal neuro deficits.    0704-  HGB 6.7 after 2 units PRBC. Will transfuse an additional 2 units this morning.    Interventions/ Plan   Transfuse PRBC, 2 units Recheck H&H after transfusion.  Transfuse as needed if hemoglobin <7. Fluid bolus, 1.5 L total IV cefepime, Flagyl Repeat lactic acid- pending         Anthoney Harada, DNP, ACNPC- AG [facility] [place]

## 2022-09-20 NOTE — ED Notes (Signed)
ED TO INPATIENT HANDOFF REPORT  Name/Age/Gender Edward [name] 82 y.o. male  Code Status    Code Status Orders  (From admission, onward)           Start     Ordered   09/20/22 1618  Full code  Continuous       Question:  By:  Answer:  Consent: discussion documented in EHR   09/20/22 1619           Code Status History     Date Active Date Inactive Code Status Order ID Comments User Context   08/24/2022 1821 08/28/2022 1714 Full Code [id number]  Carollee Herter, DO ED   08/24/2022 1739 08/24/2022 1821 Full Code [id number]  Carollee Herter, DO ED   08/18/2022 1714 08/19/2022 1756 Full Code [id number]  Maryln Gottron, MD ED   05/25/2022 1347 05/26/2022 1951 Full Code [id number]  Masters, Katie, DO ED   03/20/2022 1609 03/25/2022 2027 Full Code [id number]  Celine Mans, MD ED   05/06/2021 1544 05/09/2021 1732 Full Code [id number]  Jonah Blue, MD ED   03/16/2021 1738 03/19/2021 1847 Full Code [id number]  Merrilyn Puma, MD ED   11/05/2012 2022 11/06/2012 1346 Full Code [id number]  Duke Salvia, MD Inpatient   03/06/2012 1449 03/09/2012 1233 Full Code [id number]  [place], [place] Inpatient   06/18/2011 0954 06/22/2011 1449 Full Code [id number]  Benjamine Sprague, RN Inpatient   06/14/2011 1618 06/18/2011 0954 Full Code [id number]  Theron Arista, RN Inpatient      Advance Directive Documentation    Flowsheet Row Most Recent Value  Type of Advance Directive Healthcare Power of Attorney, Living will  Pre-existing out of facility DNR order (yellow form or pink MOST form) --  "MOST" Form in Place? --       Home/SNF/Other Home  Chief Complaint Rectal bleeding [K62.5]  Level of Care/Admitting Diagnosis ED Disposition     ED Disposition  Admit   Condition  --   Comment  Hospital Area: [facility] [[id number]]  Level of Care: Progressive [102]  Admit to Progressive based on following criteria: GI, ENDOCRINE disease patients with GI bleeding, acute liver failure or pancreatitis,  stable with diabetic ketoacidosis or thyrotoxicosis (hypothyroid) state.  May place patient in observation at [facility] or [facility] if equivalent level of care is available:: No  Covid Evaluation: Asymptomatic - no recent exposure (last 10 days) testing not required  Diagnosis: Rectal bleeding [217577]  Admitting Physician: Bobette Mo [[id number]]  Attending Physician: Bobette Mo [[id number]]          Medical History Past Medical History:  Diagnosis Date   Acute on chronic diastolic heart failure (HCC) 03/20/2022   Anemia    ?   Carotid artery occlusion    Chronic diastolic CHF (congestive heart failure) (HCC) 04/23/2012   CKD (chronic kidney disease), stage III (HCC)    Claudication (HCC)    Coronary artery disease    s/p CABG 2013   Diabetic neuropathy (HCC)    mostly feet/ legs   Diverticulosis    Dyslipidemia    Exertional shortness of breath    Fracture of one rib, left side, initial encounter for closed fracture 02/01/2021   Gastritis and gastroduodenitis    GERD (gastroesophageal reflux disease)    GI bleed    while on triple anticoag therapy   HTN (hypertension)    Pacemaker    PAF (paroxysmal atrial fibrillation) (HCC)    s/p  MAZE at time of CABG   PVD (peripheral vascular disease) (HCC)    R CEA 2011, prior PTA, left fem-pop 2014   Stroke (HCC)    vs TIA   Tubular adenoma of colon    Type II diabetes mellitus (HCC)     Allergies Allergies  Allergen Reactions   Lipitor [Atorvastatin Calcium] Other (See Comments)    Weakness/ pain in legs    IV Location/Drains/Wounds Patient Lines/Drains/Airways Status     Active Line/Drains/Airways     Name Placement date Placement time Site Days   Peripheral IV 09/20/22 20 G 1" Right Antecubital 09/20/22  1500  Antecubital  less than 1   Peripheral IV 09/20/22 18 G 1" Anterior;Distal;Left;Upper Arm 09/20/22  1506  Arm  less than 1   Wound / Incision (Open or Dehisced) 03/18/21 Skin tear Arm  Posterior;Right;Upper 03/18/21  1940  Arm  551            Labs/Imaging Results for orders placed or performed during the hospital encounter of 09/20/22 (from the past 48 hour(s))  CBC with Differential     Status: Abnormal (Preliminary result)   Collection Time: 09/20/22  2:29 PM  Result Value Ref Range   WBC 9.7 4.0 - 10.5 K/uL   RBC 3.11 (L) 4.22 - 5.81 MIL/uL   Hemoglobin 8.0 (L) 13.0 - 17.0 g/dL   HCT 40.9 (L) 81.1 - 91.4 %   MCV 88.7 80.0 - 100.0 fL   MCH 25.7 (L) 26.0 - 34.0 pg   MCHC 29.0 (L) 30.0 - 36.0 g/dL   RDW 78.2 (H) 95.6 - 21.3 %   Platelets 250 150 - 400 K/uL   nRBC 0.0 0.0 - 0.2 %    Comment: Performed at [facility], [address], [place], [place] [address]   Neutrophils Relative % PENDING %   Neutro Abs PENDING 1.7 - 7.7 K/uL   Band Neutrophils PENDING %   Lymphocytes Relative PENDING %   Lymphs Abs PENDING 0.7 - 4.0 K/uL   Monocytes Relative PENDING %   Monocytes Absolute PENDING 0.1 - 1.0 K/uL   Eosinophils Relative PENDING %   Eosinophils Absolute PENDING 0.0 - 0.5 K/uL   Basophils Relative PENDING %   Basophils Absolute PENDING 0.0 - 0.1 K/uL   WBC Morphology PENDING    RBC Morphology PENDING    Smear Review PENDING    Other PENDING %   nRBC PENDING 0 /100 WBC   Metamyelocytes Relative PENDING %   Myelocytes PENDING %   Promyelocytes Relative PENDING %   Blasts PENDING %   Immature Granulocytes PENDING %   Abs Immature Granulocytes PENDING 0.00 - 0.07 K/uL  Comprehensive metabolic panel     Status: Abnormal   Collection Time: 09/20/22  2:29 PM  Result Value Ref Range   Sodium 138 135 - 145 mmol/L   Potassium 4.3 3.5 - 5.1 mmol/L   Chloride 102 98 - 111 mmol/L   CO2 23 22 - 32 mmol/L   Glucose, Bld 141 (H) 70 - 99 mg/dL    Comment: Glucose reference range applies only to samples taken after fasting for at least 8 hours.   BUN 96 (H) 8 - 23 mg/dL   Creatinine, Ser 8.46 (H) 0.61 - 1.24 mg/dL   Calcium 8.8 (L) 8.9 -  10.3 mg/dL   Total Protein 7.8 6.5 - 8.1 g/dL   Albumin 3.1 (L) 3.5 - 5.0 g/dL   AST 20 15 - 41 U/L  ALT 15 0 - 44 U/L   Alkaline Phosphatase 153 (H) 38 - 126 U/L   Total Bilirubin 1.6 (H) 0.3 - 1.2 mg/dL   GFR, Estimated 14 (L) >60 mL/min    Comment: (NOTE) Calculated using the CKD-EPI Creatinine Equation (2021)    Anion gap 13 5 - 15    Comment: Performed at [facility], [address], [place], [place] [address]  Protime-INR     Status: Abnormal   Collection Time: 09/20/22  2:29 PM  Result Value Ref Range   Prothrombin Time 18.1 (H) 11.4 - 15.2 seconds   INR 1.5 (H) 0.8 - 1.2    Comment: (NOTE) INR goal varies based on device and disease states. Performed at [facility], [address] [place], [place] [address]   APTT     Status: None   Collection Time: 09/20/22  2:29 PM  Result Value Ref Range   aPTT 35 24 - 36 seconds    Comment: Performed at [facility], [address], [place], [place] [address]  Type and screen [facility]     Status: None   Collection Time: 09/20/22  2:29 PM  Result Value Ref Range   ABO/RH(D) O NEG    Antibody Screen NEG    Sample Expiration      09/23/2022,2359 Performed at [facility], [address] [place], [place] [address]   Lactic acid, plasma     Status: None   Collection Time: 09/20/22  3:04 PM  Result Value Ref Range   Lactic Acid, Venous 1.6 0.5 - 1.9 mmol/L    Comment: Performed at [facility], [address] [place], [place] [address]  POC occult blood, ED RN will collect     Status: Abnormal   Collection Time: 09/20/22  3:19 PM  Result Value Ref Range   Fecal Occult Bld POSITIVE (A) NEGATIVE   No results found.  Pending Labs Unresulted Labs (From admission, onward)     Start     Ordered   09/21/22 0500  CBC  Tomorrow morning,   R        09/20/22 1619   09/21/22 0500  Comprehensive metabolic panel   Tomorrow morning,   R        09/20/22 1619   09/20/22 1830  Hemoglobin and hematocrit, blood  Once-Timed,   TIMED        09/20/22 1617   09/20/22 1354  Lactic acid, plasma  (Lactic Acid)  Now then every 2 hours,   R (with STAT occurrences)      09/20/22 1353            Vitals/Pain Today's Vitals   09/20/22 1348 09/20/22 1349  BP:  (!) 153/68  Pulse:  68  Resp: 17 17  Temp: (!) 97.5 F (36.4 C)   TempSrc: Oral   SpO2:  99%  Weight: 91 kg   Height: 5\' 10"  (1.778 m)   PainSc: 0-No pain     Isolation Precautions No active isolations  Medications Medications  0.9 %  sodium chloride infusion (has no administration in time range)  acetaminophen (TYLENOL) tablet 650 mg (has no administration in time range)    Or  acetaminophen (TYLENOL) suppository 650 mg (has no administration in time range)  ondansetron (ZOFRAN) tablet 4 mg (has no administration in time range)    Or  ondansetron (ZOFRAN) injection 4 mg (has no administration in time range)  cefTRIAXone (ROCEPHIN) 2 g in sodium chloride 0.9 % 100 mL IVPB (has no administration in time range)  cefTRIAXone (ROCEPHIN) 1 g in sodium chloride 0.9 % 100 mL IVPB (has no administration in time range)  pantoprazole (PROTONIX) 80 mg /NS 100 mL IVPB (0 mg Intravenous Stopped 09/20/22 1551)  cefTRIAXone (ROCEPHIN) 1 g in sodium chloride 0.9 % 100 mL IVPB (0 g Intravenous Stopped 09/20/22 1551)  sodium chloride 0.9 % bolus 500 mL (500 mLs Intravenous New Bag/Given 09/20/22 1552)    Mobility walks with person assist

## 2022-09-20 NOTE — H&P (Signed)
History and Physical    Patient: KAM MITCHELTREE XLK:[id number] DOB: [birth year] DOA: 09/20/2022 DOS: the patient was seen and examined on 09/20/2022 PCP: Byrd Hesselbach, PA  Patient coming from: Home  Chief Complaint:  Chief Complaint  Patient presents with   Rectal Bleeding   HPI: Edward [name] is a 82 y.o. 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 He denied fever, chills, rhinorrhea, sore throat, wheezing or hemoptysis.  No chest pain, palpitations, diaphoresis, PND, orthopnea or recent pitting edema of the lower extremities.  No flank pain, dysuria, frequency or hematuria.  No polyuria, polydipsia, polyphagia or blurred vision.   Lab work: His CBC showed a white count of 9.7, hemoglobin 8.0 g/dL and platelets 366.  PT 18.1, INR 1.5 PTT 35.  Fecal occult blood was positive.  Lactic acid was normal.  CMP with normal electrolytes after calcium correction.  Glucose 141, BUN 96, creatinine 4.06, and total bilirubin 1.6 mg/dL.  Total protein 7.8 and albumin 3.1 g/dL.  Normal transaminases.  Alkaline phosphatase 153 units/L.  ED course: Initial vital signs were temperature 97.5 F, pulse 68, respirations 17, BP 153/68 mmHg O2 sat 99% on room air.  The patient received 5000 mL normal saline bolus, pantoprazole 80 mg IVP, KCl 10 mEq IVPB x 2 and ceftriaxone 1 g IVPB.  Review of Systems: As mentioned in the history of present illness. All other systems reviewed and are negative. Past Medical History:   Diagnosis Date   Acute on chronic diastolic heart failure (HCC) 03/20/2022   Anemia    ?   Carotid artery occlusion    Chronic diastolic CHF (congestive heart failure) (HCC) 04/23/2012   CKD (chronic kidney disease), stage III (HCC)    Claudication (HCC)    Coronary artery disease    s/p CABG 2013   Diabetic neuropathy (HCC)    mostly feet/ legs   Diverticulosis    Dyslipidemia    Exertional shortness of breath    Fracture of one rib, left side, initial encounter for closed fracture 02/01/2021   Gastritis and gastroduodenitis    GERD (gastroesophageal reflux disease)    GI bleed    while on triple anticoag therapy   HTN (hypertension)    Pacemaker    PAF (paroxysmal atrial fibrillation) (HCC)    s/p MAZE at time of CABG   PVD (peripheral vascular disease) (HCC)    R CEA 2011, prior PTA, left fem-pop 2014   Stroke (HCC)    vs TIA   Tubular adenoma of colon    Type II diabetes mellitus (HCC)    Past Surgical History:  Procedure Laterality Date   ABDOMINAL AORTAGRAM N/A 01/07/2012   Procedure: ABDOMINAL Ronny Flurry;  Surgeon: Chuck Hint, MD;  Location: [facility] CATH LAB;  Service: Cardiovascular;  Laterality: N/A;   BALLOON DILATION N/A 02/17/2013   Procedure: BALLOON DILATION;  Surgeon: Beverley Fiedler, MD;  Location: [facility] ENDOSCOPY;  Service: Gastroenterology;  Laterality: N/A;   BIOPSY  03/17/2021   Procedure: BIOPSY;  Surgeon: Iva Boop, MD;  Location: [facility] ENDOSCOPY;  Service: Gastroenterology;;   BIOPSY  08/26/2022  Procedure: BIOPSY;  Surgeon: Lemar Lofty., MD;  Location: [facility] ENDOSCOPY;  Service: Gastroenterology;;   CAROTID ENDARTERECTOMY Right 03/2009   CATARACT EXTRACTION Bilateral    CHOLECYSTECTOMY     CIRCUMCISION     COLONOSCOPY WITH PROPOFOL N/A 02/17/2013   Procedure: COLONOSCOPY WITH PROPOFOL;  Surgeon: Beverley Fiedler, MD;  Location: [facility] ENDOSCOPY;  Service: Gastroenterology;  Laterality: N/A;   COLONOSCOPY WITH PROPOFOL N/A 03/18/2021   Procedure:  COLONOSCOPY WITH PROPOFOL;  Surgeon: Jeani Hawking, MD;  Location: [facility] ENDOSCOPY;  Service: Gastroenterology;  Laterality: N/A;   CORONARY ARTERY BYPASS GRAFT  06/14/2011   Procedure: CORONARY ARTERY BYPASS GRAFTING (CABG);  Surgeon: Alleen Borne, MD;  Location: [facility] OR;  Service: Open Heart Surgery;  Laterality: N/A;  Coronary Artery Bypass Graft on pump times six;  utilizing internal mammary artery and right greater saphenous vein harvested endoscopically.x 5 vessels   ELECTROPHYSIOLOGY STUDY N/A 11/05/2012   Procedure: ELECTROPHYSIOLOGY STUDY;  Surgeon: Duke Salvia, MD;  Location: [facility] CATH LAB;  Service: Cardiovascular;  Laterality: N/A;   ESOPHAGOGASTRODUODENOSCOPY N/A 08/26/2022   Procedure: ESOPHAGOGASTRODUODENOSCOPY (EGD);  Surgeon: Lemar Lofty., MD;  Location: [facility] ENDOSCOPY;  Service: Gastroenterology;  Laterality: N/A;   ESOPHAGOGASTRODUODENOSCOPY (EGD) WITH PROPOFOL N/A 02/17/2013   Procedure: ESOPHAGOGASTRODUODENOSCOPY (EGD) WITH PROPOFOL;  Surgeon: Beverley Fiedler, MD;  Location: [facility] ENDOSCOPY;  Service: Gastroenterology;  Laterality: N/A;   ESOPHAGOGASTRODUODENOSCOPY (EGD) WITH PROPOFOL N/A 03/17/2021   Procedure: ESOPHAGOGASTRODUODENOSCOPY (EGD) WITH PROPOFOL;  Surgeon: Iva Boop, MD;  Location: [facility] ENDOSCOPY;  Service: Gastroenterology;  Laterality: N/A;   FEMORAL-POPLITEAL BYPASS GRAFT Left 03/06/2012   Procedure: BYPASS GRAFT FEMORAL-POPLITEAL ARTERY;  Surgeon: Chuck Hint, MD;  Location: [facility] OR;  Service: Vascular;  Laterality: Left;  Left Femoral - Below Knee Popliteal Bypass Graft with Vein and Intraoperative Arteriogram.   GI RADIOFREQUENCY ABLATION  08/26/2022   Procedure: GI RADIOFREQUENCY ABLATION;  Surgeon: Lemar Lofty., MD;  Location: [facility] ENDOSCOPY;  Service: Gastroenterology;;   GIVENS CAPSULE STUDY N/A 08/26/2022   Procedure: GIVENS CAPSULE STUDY;  Surgeon: Lemar Lofty., MD;  Location: [facility] ENDOSCOPY;  Service: Gastroenterology;  Laterality:  N/A;   IR PARACENTESIS  05/23/2022   JOINT REPLACEMENT Left    left TKA   KNEE ARTHROSCOPY Left    "I had 3" (11/05/2012)   LEAD REVISION  11/05/2012   pacemaker/notes 11/05/2012   LEAD REVISION N/A 11/05/2012   Procedure: LEAD REVISION;  Surgeon: Duke Salvia, MD;  Location: [facility] CATH LAB;  Service: Cardiovascular;  Laterality: N/A;   MAZE  06/14/2011   Procedure: MAZE;  Surgeon: Alleen Borne, MD;  Location: [facility] OR;  Service: Open Heart Surgery;  Laterality: N/A;  Ligate left atrial appendage   PERMANENT PACEMAKER INSERTION N/A 06/20/2011   Procedure: PERMANENT PACEMAKER INSERTION;  Surgeon: Duke Salvia, MD;  Location: [facility] CATH LAB;  Service: Cardiovascular;  Laterality: N/A;   POLYPECTOMY  03/18/2021   Procedure: POLYPECTOMY;  Surgeon: Jeani Hawking, MD;  Location: [facility] ENDOSCOPY;  Service: Gastroenterology;;   TONSILLECTOMY AND ADENOIDECTOMY     TOTAL KNEE ARTHROPLASTY Left    Social History:  reports that he quit smoking about 43 years ago. His smoking use included cigarettes. He started smoking about 75 years ago. He has a 96 pack-year smoking history. He has never used smokeless tobacco. He reports current alcohol use of about 2.0 standard drinks of alcohol per week. He reports that he does not use drugs.  Allergies  Allergen Reactions   Lipitor [  Atorvastatin Calcium] Other (See Comments)    Weakness/ pain in legs    Family History  Problem Relation Age of Onset   Coronary artery disease Father    Hypertension Father    Heart disease Father        Heart Disease before age 37   Heart attack Mother    Coronary artery disease Mother    Deep vein thrombosis Mother    Heart disease Mother        Heart Disease before age 8   Hyperlipidemia Mother    Hypertension Mother    Hypertension Sister    Heart disease Sister        Heart Disease before age 18   Hyperlipidemia Sister    Heart disease Brother        Heart Disease before age 32   Colon cancer Brother 86   Anesthesia  problems Neg Hx    Diabetes Neg Hx     Prior to Admission medications   Medication Sig Start Date End Date Taking? Authorizing Provider  acetaminophen (TYLENOL) 650 MG CR tablet Take 100 mg by mouth daily.    [provider]  allopurinol (ZYLOPRIM) 100 MG tablet Take 100 mg by mouth daily.    [provider]  carvedilol (COREG) 12.5 MG tablet Take 6.25 mg by mouth 2 (two) times daily with a meal.    [provider]  cholecalciferol (VITAMIN D3) 25 MCG (1000 UNIT) tablet Take 2,000 Units by mouth daily.    [provider]  cyanocobalamin 1000 MCG tablet Take 1,000 mcg by mouth daily.    [provider]  furosemide (LASIX) 20 MG tablet Take 40-60 mg by mouth See admin instructions. Takes 60 mg in the morning and 40 mg at night    [provider]  insulin glargine (LANTUS) 100 UNIT/ML injection Inject 48 Units into the skin in the morning.    [provider]  isosorbide mononitrate (IMDUR) 30 MG 24 hr tablet Take 30 mg by mouth daily.    [provider]  levothyroxine (SYNTHROID) 25 MCG tablet Take 25 mcg by mouth daily before breakfast.    [provider]  magnesium oxide (MAG-OX) 400 (240 Mg) MG tablet Take 2 tablets by mouth daily.    [provider]  Multiple Vitamin (MULTIVITAMIN) capsule Take 1 capsule by mouth daily.    [provider]  ondansetron (ZOFRAN) 4 MG tablet Take 1 tablet (4 mg total) by mouth every 6 (six) hours as needed for nausea. 08/28/22   [name], DO  pantoprazole (PROTONIX) 40 MG tablet Take 1 tablet (40 mg total) by mouth 2 (two) times daily. 08/28/22 10/27/22  [name], DO  Propylene Glycol (SYSTANE BALANCE) 0.6 % SOLN Place 1 drop into both eyes daily as needed.    [provider]  Semaglutide, 1 MG/DOSE, 2 MG/1.5ML SOPN Inject 1 mg into the skin every Sunday.    [provider]  spironolactone (ALDACTONE) 25 MG tablet Take 1 tablet  (25 mg total) by mouth daily. 05/26/22   Masters, Katie, DO  sucralfate (CARAFATE) 1 g tablet Take 1 tablet (1 g total) by mouth 4 (four) times daily -  with meals and at bedtime. Take for a Month and then take BID 08/28/22   [name], DO  testosterone cypionate (DEPOTESTOSTERONE CYPIONATE) 200 MG/ML injection Inject 200 mg into the muscle every 14 (fourteen) days. Given every other Sunday    [provider]  Zinc 50 MG TABS Take 1 tablet (50 mg total) by mouth daily. 03/25/22   [name], MD    Physical Exam: Vitals:   09/20/22 1348 09/20/22 1349  BP:  (!) 153/68  Pulse:  68  Resp: 17 17  Temp: (!) 97.5 F (36.4 C)   TempSrc: Oral   SpO2:  99%  Weight: 91 kg   Height: 5\' 10"  (1.778 m)    Physical Exam Vitals and nursing note reviewed.  Constitutional:      General: He is awake. He is not in acute distress.    Appearance: Normal appearance. He is overweight. He is ill-appearing. He is not toxic-appearing.  HENT:     Head: Normocephalic.     Nose: No rhinorrhea.     Mouth/Throat:     Mouth: Mucous membranes are moist.  Eyes:     General: No scleral icterus.    Pupils: Pupils are equal, round, and reactive to light.  Neck:     Vascular: No JVD.  Cardiovascular:     Rate and Rhythm: Normal rate and regular rhythm.     Heart sounds: S1 normal and S2 normal.  Pulmonary:     Effort: Pulmonary effort is normal.     Breath sounds: Normal breath sounds.  Abdominal:     General: Bowel sounds are normal.     Palpations: Abdomen is soft.  Musculoskeletal:     Cervical back: Neck supple.     Right lower leg: No edema.     Left lower leg: No edema.  Skin:    General: Skin is warm and dry.     Findings: Ecchymosis present.     Comments: Multiple areas of ecchymosis.  Neurological:     General: No focal deficit present.     Mental Status: He is alert and oriented to person, place, and time.  Psychiatric:        Mood and Affect: Mood normal.         Behavior: Behavior normal. Behavior is cooperative.     Data Reviewed:  Results are pending, will review when available.  Assessment and Plan: Principal Problem:   Symptomatic anemia In the setting of high-volume   Rectal bleeding Admit to stepdown/inpatient. Clear liquid diet. Keep NPO after midnight. Continue IV fluids. Continue pantoprazole 40 mg IVP daily. Monitor H&H. Transfuse as needed. Continue ceftriaxone for SBP prophylaxis. GI consult greatly appreciated. -Unable to perform tagged red blood cell study at the moment. -Given renal function would only do CTA GI bleed if bleeding profusely.  Active Problems:   Acute renal failure superimposed on stage 4 chronic kidney disease (HCC) Continue very gentle IV fluids. Hold diuretic. Avoid hypotension. Avoid nephrotoxins. Monitor intake and output. Monitor renal function/electrolytes. Consider nephrology evaluation if no improvement.    Cirrhosis of liver (HCC) Monitor LFTs. On ceftriaxone for SBP prophylaxis.    PAF (paroxysmal atrial fibrillation) (HCC) Off anticoagulation. Continue carvedilol 3.125.  For rate control.    Hyperlipidemia Not on therapy given hepatic function.    Chronic diastolic CHF (congestive heart failure) (HCC) Looks volume depleted. Holding diuretics and nitrates.    Benign essential hypertension Continue carvedilol as above. Hold other medications as earlier stated.    Type 2 diabetes mellitus with hyperglycemia (HCC) Carbohydrate modified diet. CBG monitoring 4 times daily. Add RI SS as needed. Check hemoglobin A1c.    Coronary artery disease Continue beta-blocker as above. Resume nitrates in AM if BP allows.    Gout On allopurinol.  Will resume in a.m. if no further GI issues.      Advance Care Planning:   Code Status: Full Code   Consults: [facility] Corliss Parish, MD).  Family Communication: His spouse was at bedside.  Severity of Illness: The appropriate  patient status for this patient is OBSERVATION. Observation status is judged to be reasonable and necessary in order to provide the required intensity of service to ensure the patient's safety. The patient's presenting symptoms, physical exam findings, and initial radiographic and laboratory data in the context of their medical condition is felt to place them at decreased risk for further clinical deterioration. Furthermore, it is anticipated that the patient will be medically stable for discharge from the hospital within 2 midnights of admission.   Author: Bobette Mo, MD 09/20/2022 4:10 PM  For on call review [email or web address].   This document was prepared using Dragon voice recognition software and may contain some unintended transcription errors.

## 2022-09-20 NOTE — ED Notes (Signed)
Pt. Soiled brief with red, bark black blood. Pt. Cleaned up with pei-cleanse on washcloth and dryed. Pt. New brief applied.

## 2022-09-20 NOTE — Progress Notes (Signed)
Pharmacy Antibiotic Note  Edward [name] is a 82 y.o. male admitted on 09/20/2022 with multiple episodes of profuse rectal bleeding with clots associated with fatigue .  Pharmacy has been consulted to dose cefepime for abdominal infection.  Plan: Cefepime 2gm IV q24h Follow renal function and clinical course  Height: 5\' 10"  (177.8 cm) Weight: 91 kg (200 lb 9.9 oz) IBW/kg (Calculated) : 73  Temp (24hrs), Avg:97.6 F (36.4 C), Min:97.5 F (36.4 C), Max:97.7 F (36.5 C)  Recent Labs  Lab 09/20/22 1429 09/20/22 1504 09/20/22 2111  WBC 9.7  --   --   CREATININE 4.06*  --   --   LATICACIDVEN  --  1.6 5.4*    Estimated Creatinine Clearance: 16.5 mL/min (A) (by C-G formula based on SCr of 4.06 mg/dL (H)).    Allergies  Allergen Reactions   Lipitor [Atorvastatin Calcium] Other (See Comments)    Weakness/ pain in legs    Antimicrobials this admission: 9/5 CTX x1 9/5 cefepime >> 9/5 flagyl >>  Dose adjustments this admission:   Microbiology results: 9/5 MRSA PCR: neg  Thank you for allowing pharmacy to be a part of this patient's care.  Arley Phenix RPh 09/20/2022, 11:24 PM

## 2022-09-20 NOTE — ED Provider Notes (Signed)
[facility] AT [facility] Provider Note   CSN: [id number] Arrival date & time: 09/20/22  1343     History  Chief Complaint  Patient presents with   Rectal Bleeding    Edward [name] is a 82 y.o. male with PMH as listed below who presents with rectal bleeding.   Per chart review patient was recently admitted from 08/24/2022 to 08/28/2022 with symptomatic anemia in the setting of history of recurrent GI bleeding and liver cirrhosis with paroxysmal A-fib on anticoagulation which was recently held.  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  He was treated with radiofrequency ablation and GI recommended diet advancement per VCE protocol as well as Protonix and Carafate.  GI recommended restarting anticoagulation 08/29/2022.  Today, patient reported acute onset high-volume bright red rectal bleeding that began this morning.  Also with clots.  Has had several episodes of large-volume BRBPR.  Denies any lightheadedness, syncope, chest pain, abdominal pain, nausea vomiting, or bleeding from anywhere else but he does report feeling fatigued and slept a lot yesterday.  He restarted his Eliquis on Tuesday and last dose was yesterday morning as he was sleeping all day yesterday and did not take it.  Also denies any fevers or chills, rectal pain.   Past Medical History:  Diagnosis Date   Acute on chronic diastolic heart failure (HCC) 03/20/2022   Anemia    ?   Carotid artery occlusion    Chronic diastolic CHF (congestive heart failure) (HCC) 04/23/2012   CKD (chronic kidney disease), stage III (HCC)    Claudication (HCC)    Coronary artery disease    s/p CABG 2013   Diabetic neuropathy (HCC)    mostly feet/ legs   Diverticulosis    Dyslipidemia    Exertional shortness of breath    Fracture of one rib, left side, initial encounter for closed fracture 02/01/2021   Gastritis and gastroduodenitis     GERD (gastroesophageal reflux disease)    GI bleed    while on triple anticoag therapy   HTN (hypertension)    Pacemaker    PAF (paroxysmal atrial fibrillation) (HCC)    s/p MAZE at time of CABG   PVD (peripheral vascular disease) (HCC)    R CEA 2011, prior PTA, left fem-pop 2014   Stroke (HCC)    vs TIA   Tubular adenoma of colon    Type II diabetes mellitus (HCC)        Home Medications Prior to Admission medications   Medication Sig Start Date End Date Taking? Authorizing Provider  acetaminophen (TYLENOL) 650 MG CR tablet Take 100 mg by mouth daily.    [provider]  allopurinol (ZYLOPRIM) 100 MG tablet Take 100 mg by mouth daily.    [provider]  carvedilol (COREG) 12.5 MG tablet Take 6.25 mg by mouth 2 (two) times daily with a meal.    [provider]  cholecalciferol (VITAMIN D3) 25 MCG (1000 UNIT) tablet Take 2,000 Units by mouth daily.    [provider]  cyanocobalamin 1000 MCG tablet Take 1,000 mcg by mouth daily.    [provider]  furosemide (LASIX) 20 MG tablet Take 40-60 mg by mouth See admin instructions. Takes 60 mg in the morning and 40 mg at night    [provider]  insulin glargine (LANTUS) 100 UNIT/ML injection Inject 48 Units into the skin in the morning.  [provider]  isosorbide mononitrate (IMDUR) 30 MG 24 hr tablet Take 30 mg by mouth daily.    [provider]  levothyroxine (SYNTHROID) 25 MCG tablet Take 25 mcg by mouth daily before breakfast.    [provider]  magnesium oxide (MAG-OX) 400 (240 Mg) MG tablet Take 2 tablets by mouth daily.    [provider]  Multiple Vitamin (MULTIVITAMIN) capsule Take 1 capsule by mouth daily.    [provider]  ondansetron (ZOFRAN) 4 MG tablet Take 1 tablet (4 mg total) by mouth every 6 (six) hours as needed for nausea. 08/28/22   [name], DO  pantoprazole (PROTONIX) 40 MG tablet Take 1 tablet (40 mg  total) by mouth 2 (two) times daily. 08/28/22 10/27/22  [name], DO  Propylene Glycol (SYSTANE BALANCE) 0.6 % SOLN Place 1 drop into both eyes daily as needed.    [provider]  Semaglutide, 1 MG/DOSE, 2 MG/1.5ML SOPN Inject 1 mg into the skin every Sunday.    [provider]  spironolactone (ALDACTONE) 25 MG tablet Take 1 tablet (25 mg total) by mouth daily. 05/26/22   Masters, Katie, DO  sucralfate (CARAFATE) 1 g tablet Take 1 tablet (1 g total) by mouth 4 (four) times daily -  with meals and at bedtime. Take for a Month and then take BID 08/28/22   [name], DO  testosterone cypionate (DEPOTESTOSTERONE CYPIONATE) 200 MG/ML injection Inject 200 mg into the muscle every 14 (fourteen) days. Given every other Sunday    [provider]  Zinc 50 MG TABS Take 1 tablet (50 mg total) by mouth daily. 03/25/22   [name], MD      Allergies    Lipitor [atorvastatin calcium]    Review of Systems   Review of Systems A 10 point review of systems was performed and is negative unless otherwise reported in HPI.  Physical Exam Updated Vital Signs BP (!) 153/68   Pulse 68   Temp (!) 97.5 F (36.4 C) (Oral)   Resp 17   Ht 5\' 10"  (1.778 m)   Wt 91 kg   SpO2 99%   BMI 28.79 kg/m  Physical Exam General: Normal appearing elderly male, lying in bed.  HEENT: Sclera anicteric, Dry mucous membranes, trachea midline.  Cardiology: RRR, no murmurs/rubs/gallops Resp: Normal respiratory rate and effort. CTAB, no wheezes, rhonchi, crackles.  Abd: Soft, non-tender, non-distended. No rebound tenderness or guarding.  GU: Deferred. MSK: No peripheral edema or signs of trauma. Skin: warm, dry. Neuro: A&Ox4, CNs II-XII grossly intact. MAEs. Sensation grossly intact.  Psych: Normal mood and affect.   ED Results / Procedures / Treatments   Labs (all labs ordered are listed, but only abnormal results are displayed) Labs Reviewed  CBC WITH DIFFERENTIAL/PLATELET   COMPREHENSIVE METABOLIC PANEL  LACTIC ACID, PLASMA  LACTIC ACID, PLASMA  PROTIME-INR  APTT  POC OCCULT BLOOD, ED  TYPE AND SCREEN    EKG None  Radiology No results found.  Procedures Procedures    Medications Ordered in ED Medications  pantoprazole (PROTONIX) 80 mg /NS 100 mL IVPB (has no administration in time range)  cefTRIAXone (ROCEPHIN) 1 g in sodium chloride 0.9 % 100 mL IVPB (has no administration in time range)    ED Course/ Medical Decision Making/ A&P                          Medical Decision Making Amount and/or  Complexity of Data Reviewed Labs: ordered.    This patient presents to the ED for concern of rectal bleeding, this involves an extensive number of treatment options, and is a complaint that carries with it a high risk of complications and morbidity.  I considered the following differential and admission for this acute, potentially life threatening condition.   MDM:    Based on clinical presentation, hematochezia reported and clinical exam history and findings, most concerned about LGIB.   DDx for LGIB includes but is not limited to: Pt does have h/o GIB recently diagnosed w/ GAVE via video endoscopy. He is also anticoagulated w/ eliquis and began bleeding immediately after eliquis was restarted. He has h/o diverticulosis as well. He has h/o cirrhosis and must consider variceal bleeding. He has no abd pain to indicate diverticulitis, ischemic colitis. Gratefully he has stable BP and HR right now, though he does take CCB.    Update and Plan: -Rockall Score: Variable Age Shock Comorbidity Diagnosis Evidence of Bleeding  0 point < 60 No shock None  None  1 point 60-79 Pulse > 100 SBP > 100  MWT   2 points > 80 SBP < 100 CHF CAD Other All other diseases Blood Adherent clot Spurting vessel  3 points   ARF Liver disease Metastatic cancer    Score < 3 points--good prognosis; score > 8--poor prognosis. Score: 2 + 0 + 3 + 2 + 2 = 9  -two large  bore IVs, on the monitor -cbc, cmp, coags, type and screen -starting on protonix bolus and gtt (80 then 18ml/hr), ceftriaxone for liver disease      Labs: I Ordered labs, which are pending at time of signout.  Additional history obtained from chart review, wife at bedside.    Cardiac Monitoring: The patient was maintained on a cardiac monitor.  I personally viewed and interpreted the cardiac monitored which showed an underlying rhythm of: NSR  Social Determinants of Health: Lives independently  Disposition:  Patient is signed out to the oncoming ED physician Dr. Estell Harpin who is made aware of his history, presentation, exam, and plan.    Co morbidities that complicate the patient evaluation  Past Medical History:  Diagnosis Date   Acute on chronic diastolic heart failure (HCC) 03/20/2022   Anemia    ?   Carotid artery occlusion    Chronic diastolic CHF (congestive heart failure) (HCC) 04/23/2012   CKD (chronic kidney disease), stage III (HCC)    Claudication (HCC)    Coronary artery disease    s/p CABG 2013   Diabetic neuropathy (HCC)    mostly feet/ legs   Diverticulosis    Dyslipidemia    Exertional shortness of breath    Fracture of one rib, left side, initial encounter for closed fracture 02/01/2021   Gastritis and gastroduodenitis    GERD (gastroesophageal reflux disease)    GI bleed    while on triple anticoag therapy   HTN (hypertension)    Pacemaker    PAF (paroxysmal atrial fibrillation) (HCC)    s/p MAZE at time of CABG   PVD (peripheral vascular disease) (HCC)    R CEA 2011, prior PTA, left fem-pop 2014   Stroke (HCC)    vs TIA   Tubular adenoma of colon    Type II diabetes mellitus (HCC)      Medicines Meds ordered this encounter  Medications   pantoprazole (PROTONIX) 80 mg /NS 100 mL IVPB   cefTRIAXone (ROCEPHIN) 1 g in sodium  chloride 0.9 % 100 mL IVPB    Order Specific Question:   Antibiotic Indication:    Answer:   Other Indication (list  below)    Order Specific Question:   Other Indication:    Answer:   GIB in liver disease    I have reviewed the patients home medicines and have made adjustments as needed  Problem List / ED Course: Problem List Items Addressed This Visit   None Visit Diagnoses     Rectal bleeding    -  Primary                   This note was created using dictation software, which may contain spelling or grammatical errors.    Loetta Rough, MD 09/20/22 [id number]

## 2022-09-21 ENCOUNTER — Observation Stay (HOSPITAL_COMMUNITY): Payer: No Typology Code available for payment source

## 2022-09-21 DIAGNOSIS — I8510 Secondary esophageal varices without bleeding: Secondary | ICD-10-CM | POA: Diagnosis present

## 2022-09-21 DIAGNOSIS — E1142 Type 2 diabetes mellitus with diabetic polyneuropathy: Secondary | ICD-10-CM | POA: Diagnosis present

## 2022-09-21 DIAGNOSIS — K5731 Diverticulosis of large intestine without perforation or abscess with bleeding: Secondary | ICD-10-CM | POA: Diagnosis present

## 2022-09-21 DIAGNOSIS — E039 Hypothyroidism, unspecified: Secondary | ICD-10-CM | POA: Diagnosis present

## 2022-09-21 DIAGNOSIS — N179 Acute kidney failure, unspecified: Secondary | ICD-10-CM | POA: Diagnosis present

## 2022-09-21 DIAGNOSIS — I130 Hypertensive heart and chronic kidney disease with heart failure and stage 1 through stage 4 chronic kidney disease, or unspecified chronic kidney disease: Secondary | ICD-10-CM | POA: Diagnosis present

## 2022-09-21 DIAGNOSIS — E872 Acidosis, unspecified: Secondary | ICD-10-CM | POA: Diagnosis present

## 2022-09-21 DIAGNOSIS — E1151 Type 2 diabetes mellitus with diabetic peripheral angiopathy without gangrene: Secondary | ICD-10-CM | POA: Diagnosis present

## 2022-09-21 DIAGNOSIS — K31811 Angiodysplasia of stomach and duodenum with bleeding: Secondary | ICD-10-CM | POA: Diagnosis present

## 2022-09-21 DIAGNOSIS — I8500 Esophageal varices without bleeding: Secondary | ICD-10-CM | POA: Diagnosis not present

## 2022-09-21 DIAGNOSIS — K449 Diaphragmatic hernia without obstruction or gangrene: Secondary | ICD-10-CM | POA: Diagnosis not present

## 2022-09-21 DIAGNOSIS — Z8719 Personal history of other diseases of the digestive system: Secondary | ICD-10-CM

## 2022-09-21 DIAGNOSIS — I480 Paroxysmal atrial fibrillation: Secondary | ICD-10-CM | POA: Diagnosis present

## 2022-09-21 DIAGNOSIS — K31819 Angiodysplasia of stomach and duodenum without bleeding: Secondary | ICD-10-CM | POA: Diagnosis present

## 2022-09-21 DIAGNOSIS — D62 Acute posthemorrhagic anemia: Secondary | ICD-10-CM

## 2022-09-21 DIAGNOSIS — G9341 Metabolic encephalopathy: Secondary | ICD-10-CM | POA: Diagnosis present

## 2022-09-21 DIAGNOSIS — K766 Portal hypertension: Secondary | ICD-10-CM | POA: Diagnosis present

## 2022-09-21 DIAGNOSIS — N184 Chronic kidney disease, stage 4 (severe): Secondary | ICD-10-CM | POA: Diagnosis present

## 2022-09-21 DIAGNOSIS — K259 Gastric ulcer, unspecified as acute or chronic, without hemorrhage or perforation: Secondary | ICD-10-CM | POA: Diagnosis present

## 2022-09-21 DIAGNOSIS — K7460 Unspecified cirrhosis of liver: Secondary | ICD-10-CM | POA: Diagnosis present

## 2022-09-21 DIAGNOSIS — I6529 Occlusion and stenosis of unspecified carotid artery: Secondary | ICD-10-CM | POA: Diagnosis present

## 2022-09-21 DIAGNOSIS — Z7901 Long term (current) use of anticoagulants: Secondary | ICD-10-CM

## 2022-09-21 DIAGNOSIS — E1122 Type 2 diabetes mellitus with diabetic chronic kidney disease: Secondary | ICD-10-CM | POA: Diagnosis present

## 2022-09-21 DIAGNOSIS — E1165 Type 2 diabetes mellitus with hyperglycemia: Secondary | ICD-10-CM | POA: Diagnosis present

## 2022-09-21 DIAGNOSIS — D631 Anemia in chronic kidney disease: Secondary | ICD-10-CM | POA: Diagnosis present

## 2022-09-21 DIAGNOSIS — K2289 Other specified disease of esophagus: Secondary | ICD-10-CM | POA: Diagnosis not present

## 2022-09-21 DIAGNOSIS — K625 Hemorrhage of anus and rectum: Secondary | ICD-10-CM | POA: Diagnosis present

## 2022-09-21 DIAGNOSIS — I5032 Chronic diastolic (congestive) heart failure: Secondary | ICD-10-CM | POA: Diagnosis present

## 2022-09-21 DIAGNOSIS — Z794 Long term (current) use of insulin: Secondary | ICD-10-CM | POA: Diagnosis not present

## 2022-09-21 DIAGNOSIS — E785 Hyperlipidemia, unspecified: Secondary | ICD-10-CM | POA: Diagnosis present

## 2022-09-21 DIAGNOSIS — K922 Gastrointestinal hemorrhage, unspecified: Secondary | ICD-10-CM | POA: Diagnosis present

## 2022-09-21 HISTORY — DX: Angiodysplasia of stomach and duodenum without bleeding: K31.819

## 2022-09-21 LAB — PREPARE RBC (CROSSMATCH)

## 2022-09-21 LAB — COMPREHENSIVE METABOLIC PANEL
ALT: 14 U/L (ref 0–44)
AST: 22 U/L (ref 15–41)
Albumin: 2.4 g/dL — ABNORMAL LOW (ref 3.5–5.0)
Alkaline Phosphatase: 92 U/L (ref 38–126)
Anion gap: 13 (ref 5–15)
BUN: 91 mg/dL — ABNORMAL HIGH (ref 8–23)
CO2: 17 mmol/L — ABNORMAL LOW (ref 22–32)
Calcium: 7.9 mg/dL — ABNORMAL LOW (ref 8.9–10.3)
Chloride: 110 mmol/L (ref 98–111)
Creatinine, Ser: 4.03 mg/dL — ABNORMAL HIGH (ref 0.61–1.24)
GFR, Estimated: 14 mL/min — ABNORMAL LOW (ref >60)
Glucose, Bld: 142 mg/dL — ABNORMAL HIGH (ref 70–99)
Potassium: 4.3 mmol/L (ref 3.5–5.1)
Sodium: 140 mmol/L (ref 135–145)
Total Bilirubin: 2.1 mg/dL — ABNORMAL HIGH (ref 0.3–1.2)
Total Protein: 5.5 g/dL — ABNORMAL LOW (ref 6.5–8.1)

## 2022-09-21 LAB — CBC
HCT: 22.6 % — ABNORMAL LOW (ref 39.0–52.0)
Hemoglobin: 6.7 g/dL — CL (ref 13.0–17.0)
MCH: 26 pg (ref 26.0–34.0)
MCHC: 29.6 g/dL — ABNORMAL LOW (ref 30.0–36.0)
MCV: 87.6 fL (ref 80.0–100.0)
Platelets: 211 10*3/uL (ref 150–400)
RBC: 2.58 MIL/uL — ABNORMAL LOW (ref 4.22–5.81)
RDW: 22.1 % — ABNORMAL HIGH (ref 11.5–15.5)
WBC: 16.3 10*3/uL — ABNORMAL HIGH (ref 4.0–10.5)
nRBC: 0 % (ref 0.0–0.2)

## 2022-09-21 LAB — GLUCOSE, CAPILLARY
Glucose-Capillary: 124 mg/dL — ABNORMAL HIGH (ref 70–99)
Glucose-Capillary: 139 mg/dL — ABNORMAL HIGH (ref 70–99)
Glucose-Capillary: 120 mg/dL — ABNORMAL HIGH (ref 70–99)
Glucose-Capillary: 130 mg/dL — ABNORMAL HIGH (ref 70–99)

## 2022-09-21 LAB — HEMOGLOBIN AND HEMATOCRIT, BLOOD
HCT: 25.6 % — ABNORMAL LOW (ref 39.0–52.0)
Hemoglobin: 8 g/dL — ABNORMAL LOW (ref 13.0–17.0)

## 2022-09-21 LAB — LACTIC ACID, PLASMA: Lactic Acid, Venous: 2.3 mmol/L (ref 0.5–1.9)

## 2022-09-21 MED ORDER — ACETAMINOPHEN 10 MG/ML IV SOLN
500.0000 mg | Freq: Once | INTRAVENOUS | Status: AC
  Administered 2022-09-21: 500 mg via INTRAVENOUS
  Filled 2022-09-21: qty 100

## 2022-09-21 MED ORDER — CARMEX CLASSIC LIP BALM EX OINT
TOPICAL_OINTMENT | CUTANEOUS | Status: DC | PRN
  Filled 2022-09-21: qty 10

## 2022-09-21 MED ORDER — SODIUM CHLORIDE 0.9% IV SOLUTION: Freq: Once | INTRAVENOUS | Status: AC

## 2022-09-21 MED ORDER — SODIUM CHLORIDE 0.9 % IV BOLUS
1000.0000 mL | Freq: Once | INTRAVENOUS | Status: AC
  Administered 2022-09-21: 1000 mL via INTRAVENOUS

## 2022-09-21 MED ORDER — TECHNETIUM TC 99M-LABELED RED BLOOD CELLS IV KIT
20.4000 | PACK | Freq: Once | INTRAVENOUS | Status: AC
  Administered 2022-09-21: 20.4 via INTRAVENOUS

## 2022-09-21 MED ORDER — INSULIN ASPART 100 UNIT/ML IJ SOLN: 0.0000 [IU] | Freq: Every day | INTRAMUSCULAR | Status: DC

## 2022-09-21 MED ORDER — MORPHINE SULFATE (PF) 2 MG/ML IV SOLN
1.0000 mg | INTRAVENOUS | Status: DC | PRN
  Administered 2022-09-21: 1 mg via INTRAVENOUS
  Filled 2022-09-21: qty 1

## 2022-09-21 MED ORDER — SODIUM CHLORIDE 0.9 % IV SOLN: INTRAVENOUS | Status: AC

## 2022-09-21 MED ORDER — SODIUM CHLORIDE 0.9 % IV SOLN
50.0000 ug/h | INTRAVENOUS | Status: DC
  Administered 2022-09-21 – 2022-09-22 (×2): 50 ug/h via INTRAVENOUS
  Filled 2022-09-21 (×2): qty 1

## 2022-09-21 MED ORDER — INSULIN ASPART 100 UNIT/ML IJ SOLN
0.0000 [IU] | Freq: Three times a day (TID) | INTRAMUSCULAR | Status: DC
  Administered 2022-09-22: 1 [IU] via SUBCUTANEOUS
  Administered 2022-09-22: 3 [IU] via SUBCUTANEOUS
  Administered 2022-09-22: 1 [IU] via SUBCUTANEOUS
  Administered 2022-09-23: 2 [IU] via SUBCUTANEOUS
  Administered 2022-09-23: 1 [IU] via SUBCUTANEOUS

## 2022-09-21 MED ORDER — SODIUM CHLORIDE 0.9 % IV SOLN
2.0000 g | INTRAVENOUS | Status: DC
  Administered 2022-09-21: 2 g via INTRAVENOUS
  Filled 2022-09-21 (×2): qty 20

## 2022-09-21 NOTE — H&P (View-Only) (Signed)
Daily Progress Note  DOA: 09/20/2022 Hospital Day: 2  Chief Complaint: lower GI bleed  ASSESSMENT & PLAN   Brief Narrative:  Edward [name] is a 82 y.o. year old male with a history of CHF, CKD stage III, CAD, A-fib (prior MAZE and on Eliquis), PVD, diabetes, hypertension, hyperlipidemia, COPD, bladder cancer, cirrhosis (complicated by portal hypertension manifested as ascites), colon polyps.  The GI service is consulted for evaluation and management of acute on chronic anemia and rectal bleeding.  Acute on chronic anemia secondary to lower (painless) GI bleed. Positive fecal occult. On 8/4 we were consulted for anemia and heme positive stools. 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 VCE normal.  Prior EGD March 2023 showed gastritis with biopsies showing slight chronic inflammation and reactive changes, no H. pylori. Colonoscopy March 2023 showed 2 polyps and diverticulosis.  Discharged with Hgb 8.7. Upon this admission Hgb was 8>6.5>6.7. He is also anticoagulated w/ eliquis and began bleeding immediately after eliquis was restarted on Tuesday. Last dose of Eliquis was Wednesday morning,skipped evening dose. He has h/o pandiverticulosis as well, must consider diverticular bleed. He has h/o cirrhosis and must consider variceal bleeding. He has no abd pain to indicate diverticulitis, ischemic colitis. Stable BP and HR. WBC up from 9.7 to 16.3. Received 2 units of PRBCs, 1 unit is currently running. -NPO  -order Tagged RBC scan to rule out diverticular bleed. Was not able to do yesterday afternoon will complete this am. Family had questions so went over everything this morning. They agree to proceed. -if scan is negative will proceed with endoscopy and flex sigmoidoscopy possibly this afternoon or  tomorrow -Monitor H/H with transfusion as needed to maintain Hgb >7.   ADDENDUM: NM GI bleeding scan negative. Will plan on endoscopy and flex sig for tomorrow. NPO after MN.  Elevated lactic acid- Lactic acid 1.6 --> 5.4> 2.3  Consider hypovolemia shock from blood loss, infection from potential SBP? Afebrile, hypotensive, WBC up from 9.7 to 16.3  Was started on ceftriaxone for SBP prophylaxis.  Antibiotics have been broadened to cefepime and Flagyl.   CKD stage III - BUN 96>91 Creatinine 4.06>4.03   Chronic diastolic CHF, LV EF 55 - 60%, moderately reduced RV function per ECHO 03/21/2022. On Lasix 40mg  po every day.    Paroxysmal atrial fibrillation, Eliquis on hold secondary to GI bleed.  On Carvedilol.    Cirrhosis- Albumin  3.1, T. Bilirubin 1.6, Alk phos 153, AST-20, ALT- 15. EGD 8/11 for grade 1 esophageal varices and portal hypertensive gastropathy. Denies Hemoptysis. MELD 3.0: 25 at 09/21/2022  6:31 AM MELD-Na: 27 at 09/21/2022  6:31 AM Calculated from: Serum Creatinine: 4.03 mg/dL (Using max of 3 mg/dL) at 01/20/1094  0:45 AM Serum Sodium: 140 mmol/L (Using max of 137 mmol/L) at 09/21/2022  6:31 AM Total Bilirubin: 2.1 mg/dL at 4/0/9811  9:14 AM Serum Albumin: 2.4 g/dL at 07/23/2954  2:13 AM INR(ratio): 1.5 at 09/20/2022  2:29 PM Age at listing (hypothetical): 82 years Sex: Male at 09/21/2022  6:31 AM -Pending ammonia level -On ceftriaxone for SBP prophylaxis.    Type 2 diabetes mellitus  -on insulin and Ozempic at home   Subjective   Patient lying in bed, wife at bedside. Patient states he was not feeling well last night and experienced altered mental  status. His wife confirms he was confused last night. Patient reports he is feeling much better this morning, A&O x3. He reports two trips to bathroom this morning with dark red stool and clots. Confirmed by RN. Denies nausea or vomiting. Denies abdominal pain. He denies coffee-ground emesis or hematemesis.      They had questions about  RBC blood scan, reviewed with them and answered all their questions. Agree to proceed.  Objective  08/26/2022 Upper Endoscopy   Impression:  - No gross lesions in the proximal esophagus and in the mid esophagus. - Small grade I esophageal varices noted distally.  - Z- line irregular, 37 cm from the incisors.  - 4 cm hiatal hernia.  - Portal hypertensive gastropathy proximally.  - Erythematous mucosa in the stomach. Biopsied.  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 Treated with radiofrequency ablation.  - No gross lesions in the duodenal bulb, in the first portion of the duodenum and in the second portion of the duodenum.  - Successful completion of the Video Capsule Enteroscope placement into the bulb.   Capsule endoscopy report: shows no obvious SB lesions.  Study time extent limited due to retained SB creations.  Few small free-floating clots likely due to recent RFA.  No active bleeding.   LIMITED ABDOMEN ULTRASOUND FOR ASCITES  05/2022   IMPRESSION: Mild ascites in the right and left lower quadrants with small pockets.   Echo 2D (03/2022)   Left ventricular ejection fraction, by estimation, is 55 to 60%   Colonoscopy 03/2021 - Two 2 to 3 mm polyps in the transverse colon, removed with a cold snare. Complete resection. Partial retrieval.  - Diverticulosis in the entire examined colon.   Recent Labs    09/20/22 1429 09/20/22 2111 09/21/22 0631  WBC 9.7  --  16.3*  HGB 8.0* 6.5* 6.7*  HCT 27.6* 22.6* 22.6*  PLT 250  --  211   BMET Recent Labs    09/20/22 1429 09/21/22 0631  NA 138 140  K 4.3 4.3  CL 102 110  CO2 23 17*  GLUCOSE 141* 142*  BUN 96* 91*  CREATININE 4.06* 4.03*  CALCIUM 8.8* 7.9*   LFT Recent Labs    09/21/22 0631  PROT 5.5*  ALBUMIN 2.4*  AST 22  ALT 14  ALKPHOS 92  BILITOT 2.1*   PT/INR Recent Labs    09/20/22 1429  LABPROT 18.1*  INR 1.5*     Imaging:  DG Chest 2 View CLINICAL  DATA:  Altered mental status, evaluate for infection  EXAM: CHEST - 2 VIEW  COMPARISON:  05/25/2022  FINDINGS: Transverse diameter of heart is increased. Metallic sutures are seen in the sternum. There is metallic clamp in region of left atrial appendage. Pacemaker battery is seen in the left infraclavicular region with tips of leads in right atrium and right ventricle. There are no signs of alveolar pulmonary edema or focal pulmonary consolidation. There is no pleural effusion or pneumothorax.  IMPRESSION: Cardiomegaly. There are no signs of pulmonary edema or focal pulmonary consolidation.  Electronically Signed   By: Ernie Avena M.D.   On: 08/18/2022 15:12 CT HEAD WO CONTRAST ( ) CLINICAL DATA:  Mental status changes.  EXAM: CT HEAD WITHOUT CONTRAST  TECHNIQUE: Contiguous axial images were obtained from the base of the skull through the vertex without intravenous contrast.  RADIATION DOSE REDUCTION: This exam was performed according to the departmental dose-optimization program which includes automated exposure  control, adjustment of the mA and/or kV according to patient size and/or use of iterative reconstruction technique.  COMPARISON:  09/30/2020  FINDINGS: Brain: No evidence for acute hemorrhage, hydrocephalus, or midline shift. No definite CT evidence for acute ischemia. 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 Stable 3.7 x 2.5 cm (3.9 x 2.6 cm reported previously) superior left convexity meningioma without appreciable adjacent edema. As before, lesion does generates some mass-effect on the adjacent cortex.  Vascular: No hyperdense vessel or unexpected calcification.  Skull: No evidence for fracture. No worrisome lytic or sclerotic lesion.  Sinuses/Orbits: The visualized paranasal sinuses and mastoid air cells are clear. Visualized portions of the globes and intraorbital fat are unremarkable.  Other: None  IMPRESSION: 1. No acute  intracranial abnormality. 2. 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 3. Old right parietal infarct.  Electronically Signed   By: Kennith Center M.D.   On: 08/18/2022 14:58     Scheduled inpatient medications:   carvedilol  3.125 mg Oral BID WC   Chlorhexidine Gluconate Cloth  6 each Topical Daily   pantoprazole (PROTONIX) IV  40 mg Intravenous Q24H   Continuous inpatient infusions:   sodium chloride 50 mL/hr at 09/21/22 0151   ceFEPime (MAXIPIME) IV     metronidazole Stopped (09/21/22 0151)   PRN inpatient medications: acetaminophen **OR** acetaminophen, ondansetron **OR** ondansetron (ZOFRAN) IV  Vital signs in last 24 hours: Temp:  [97.5 F (36.4 C)-98.8 F (37.1 C)] 97.8 F (36.6 C) (09/06 0822) Pulse Rate:  [68-83] 82 (09/06 0822) Resp:  [9-21] 19 (09/06 0822) BP: (76-153)/(21-72) 129/32 (09/06 0822) SpO2:  [95 %-100 %] 100 % (09/06 0822) Weight:  [91 kg] 91 kg (09/05 1348) Last BM Date : 09/20/22  Intake/Output Summary (Last 24 hours) at 09/21/2022 0856 Last data filed at 09/21/2022 0819 Gross per 24 hour  Intake 2226.37 ml  Output 650 ml  Net 1576.37 ml    Intake/Output from previous day: 09/05 0701 - 09/06 0700 In: 2200.4 [I.V.:122.5; Blood:767; IV Piggyback:1310.9] Out: 650 [Stool:650] Intake/Output this shift: Total I/O In: 26 [I.V.:2; Blood:24] Out: -    Physical Exam:  General: Alert male in NAD Heart:  Regular rate and irregular rhythm.  Pulmonary: Wheezing noted bilaterally  Abdomen: Soft, nondistended, nontender. Hypoactive bowel sounds. Extremities: +1 extremity edema  Neurologic: Alert and oriented Psych: Pleasant. Cooperative. Insight appears normal.    Principal Problem:   Rectal bleeding Active Problems:   PAF (paroxysmal atrial fibrillation) (HCC)   Symptomatic anemia   Hyperlipidemia   Coronary artery disease   Chronic diastolic CHF (congestive heart failure) (HCC)   Gout   Benign essential hypertension   Acute  renal failure superimposed on stage 4 chronic kidney disease (HCC)   Type 2 diabetes mellitus with hyperglycemia (HCC)   Cirrhosis of liver (HCC)   History of diverticulosis   GAVE (gastric antral vascular ectasia)   Acute on chronic blood loss anemia   Chronic anticoagulation     LOS: 0 days   Margarite Gouge Morene Cecilio ,NP 09/21/2022, 8:56 AM

## 2022-09-21 NOTE — Progress Notes (Addendum)
Daily Progress Note  DOA: 09/20/2022 Hospital Day: 2  Chief Complaint: lower GI bleed  ASSESSMENT & PLAN   Brief Narrative:  Edward [name] is a 82 y.o. year old male with a history of CHF, CKD stage III, CAD, A-fib (prior MAZE and on Eliquis), PVD, diabetes, hypertension, hyperlipidemia, COPD, bladder cancer, cirrhosis (complicated by portal hypertension manifested as ascites), colon polyps.  The GI service is consulted for evaluation and management of acute on chronic anemia and rectal bleeding.  Acute on chronic anemia secondary to lower (painless) GI bleed. Positive fecal occult. On 8/4 we were consulted for anemia and heme positive stools. 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 VCE normal.  Prior EGD March 2023 showed gastritis with biopsies showing slight chronic inflammation and reactive changes, no H. pylori. Colonoscopy March 2023 showed 2 polyps and diverticulosis.  Discharged with Hgb 8.7. Upon this admission Hgb was 8>6.5>6.7. He is also anticoagulated w/ eliquis and began bleeding immediately after eliquis was restarted on Tuesday. Last dose of Eliquis was Wednesday morning,skipped evening dose. He has h/o pandiverticulosis as well, must consider diverticular bleed. He has h/o cirrhosis and must consider variceal bleeding. He has no abd pain to indicate diverticulitis, ischemic colitis. Stable BP and HR. WBC up from 9.7 to 16.3. Received 2 units of PRBCs, 1 unit is currently running. -NPO  -order Tagged RBC scan to rule out diverticular bleed. Was not able to do yesterday afternoon will complete this am. Family had questions so went over everything this morning. They agree to proceed. -if scan is negative will proceed with endoscopy and flex sigmoidoscopy possibly this afternoon or  tomorrow -Monitor H/H with transfusion as needed to maintain Hgb >7.   ADDENDUM: NM GI bleeding scan negative. Will plan on endoscopy and flex sig for tomorrow. NPO after MN.  Elevated lactic acid- Lactic acid 1.6 --> 5.4> 2.3  Consider hypovolemia shock from blood loss, infection from potential SBP? Afebrile, hypotensive, WBC up from 9.7 to 16.3  Was started on ceftriaxone for SBP prophylaxis.  Antibiotics have been broadened to cefepime and Flagyl.   CKD stage III - BUN 96>91 Creatinine 4.06>4.03   Chronic diastolic CHF, LV EF 55 - 60%, moderately reduced RV function per ECHO 03/21/2022. On Lasix 40mg  po every day.    Paroxysmal atrial fibrillation, Eliquis on hold secondary to GI bleed.  On Carvedilol.    Cirrhosis- Albumin  3.1, T. Bilirubin 1.6, Alk phos 153, AST-20, ALT- 15. EGD 8/11 for grade 1 esophageal varices and portal hypertensive gastropathy. Denies Hemoptysis. MELD 3.0: 25 at 09/21/2022  6:31 AM MELD-Na: 27 at 09/21/2022  6:31 AM Calculated from: Serum Creatinine: 4.03 mg/dL (Using max of 3 mg/dL) at 01/20/1094  0:45 AM Serum Sodium: 140 mmol/L (Using max of 137 mmol/L) at 09/21/2022  6:31 AM Total Bilirubin: 2.1 mg/dL at 4/0/9811  9:14 AM Serum Albumin: 2.4 g/dL at 07/23/2954  2:13 AM INR(ratio): 1.5 at 09/20/2022  2:29 PM Age at listing (hypothetical): 82 years Sex: Male at 09/21/2022  6:31 AM -Pending ammonia level -On ceftriaxone for SBP prophylaxis.    Type 2 diabetes mellitus  -on insulin and Ozempic at home   Subjective   Patient lying in bed, wife at bedside. Patient states he was not feeling well last night and experienced altered mental  status. His wife confirms he was confused last night. Patient reports he is feeling much better this morning, A&O x3. He reports two trips to bathroom this morning with dark red stool and clots. Confirmed by RN. Denies nausea or vomiting. Denies abdominal pain. He denies coffee-ground emesis or hematemesis.      They had questions about  RBC blood scan, reviewed with them and answered all their questions. Agree to proceed.  Objective  08/26/2022 Upper Endoscopy   Impression:  - No gross lesions in the proximal esophagus and in the mid esophagus. - Small grade I esophageal varices noted distally.  - Z- line irregular, 37 cm from the incisors.  - 4 cm hiatal hernia.  - Portal hypertensive gastropathy proximally.  - Erythematous mucosa in the stomach. Biopsied.  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 Treated with radiofrequency ablation.  - No gross lesions in the duodenal bulb, in the first portion of the duodenum and in the second portion of the duodenum.  - Successful completion of the Video Capsule Enteroscope placement into the bulb.   Capsule endoscopy report: shows no obvious SB lesions.  Study time extent limited due to retained SB creations.  Few small free-floating clots likely due to recent RFA.  No active bleeding.   LIMITED ABDOMEN ULTRASOUND FOR ASCITES  05/2022   IMPRESSION: Mild ascites in the right and left lower quadrants with small pockets.   Echo 2D (03/2022)   Left ventricular ejection fraction, by estimation, is 55 to 60%   Colonoscopy 03/2021 - Two 2 to 3 mm polyps in the transverse colon, removed with a cold snare. Complete resection. Partial retrieval.  - Diverticulosis in the entire examined colon.   Recent Labs    09/20/22 1429 09/20/22 2111 09/21/22 0631  WBC 9.7  --  16.3*  HGB 8.0* 6.5* 6.7*  HCT 27.6* 22.6* 22.6*  PLT 250  --  211   BMET Recent Labs    09/20/22 1429 09/21/22 0631  NA 138 140  K 4.3 4.3  CL 102 110  CO2 23 17*  GLUCOSE 141* 142*  BUN 96* 91*  CREATININE 4.06* 4.03*  CALCIUM 8.8* 7.9*   LFT Recent Labs    09/21/22 0631  PROT 5.5*  ALBUMIN 2.4*  AST 22  ALT 14  ALKPHOS 92  BILITOT 2.1*   PT/INR Recent Labs    09/20/22 1429  LABPROT 18.1*  INR 1.5*     Imaging:  DG Chest 2 View CLINICAL  DATA:  Altered mental status, evaluate for infection  EXAM: CHEST - 2 VIEW  COMPARISON:  05/25/2022  FINDINGS: Transverse diameter of heart is increased. Metallic sutures are seen in the sternum. There is metallic clamp in region of left atrial appendage. Pacemaker battery is seen in the left infraclavicular region with tips of leads in right atrium and right ventricle. There are no signs of alveolar pulmonary edema or focal pulmonary consolidation. There is no pleural effusion or pneumothorax.  IMPRESSION: Cardiomegaly. There are no signs of pulmonary edema or focal pulmonary consolidation.  Electronically Signed   By: Ernie Avena M.D.   On: 08/18/2022 15:12 CT HEAD WO CONTRAST ( ) CLINICAL DATA:  Mental status changes.  EXAM: CT HEAD WITHOUT CONTRAST  TECHNIQUE: Contiguous axial images were obtained from the base of the skull through the vertex without intravenous contrast.  RADIATION DOSE REDUCTION: This exam was performed according to the departmental dose-optimization program which includes automated exposure  control, adjustment of the mA and/or kV according to patient size and/or use of iterative reconstruction technique.  COMPARISON:  09/30/2020  FINDINGS: Brain: No evidence for acute hemorrhage, hydrocephalus, or midline shift. No definite CT evidence for acute ischemia. 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 Stable 3.7 x 2.5 cm (3.9 x 2.6 cm reported previously) superior left convexity meningioma without appreciable adjacent edema. As before, lesion does generates some mass-effect on the adjacent cortex.  Vascular: No hyperdense vessel or unexpected calcification.  Skull: No evidence for fracture. No worrisome lytic or sclerotic lesion.  Sinuses/Orbits: The visualized paranasal sinuses and mastoid air cells are clear. Visualized portions of the globes and intraorbital fat are unremarkable.  Other: None  IMPRESSION: 1. No acute  intracranial abnormality. 2. 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 3. Old right parietal infarct.  Electronically Signed   By: Kennith Center M.D.   On: 08/18/2022 14:58     Scheduled inpatient medications:   carvedilol  3.125 mg Oral BID WC   Chlorhexidine Gluconate Cloth  6 each Topical Daily   pantoprazole (PROTONIX) IV  40 mg Intravenous Q24H   Continuous inpatient infusions:   sodium chloride 50 mL/hr at 09/21/22 0151   ceFEPime (MAXIPIME) IV     metronidazole Stopped (09/21/22 0151)   PRN inpatient medications: acetaminophen **OR** acetaminophen, ondansetron **OR** ondansetron (ZOFRAN) IV  Vital signs in last 24 hours: Temp:  [97.5 F (36.4 C)-98.8 F (37.1 C)] 97.8 F (36.6 C) (09/06 0822) Pulse Rate:  [68-83] 82 (09/06 0822) Resp:  [9-21] 19 (09/06 0822) BP: (76-153)/(21-72) 129/32 (09/06 0822) SpO2:  [95 %-100 %] 100 % (09/06 0822) Weight:  [91 kg] 91 kg (09/05 1348) Last BM Date : 09/20/22  Intake/Output Summary (Last 24 hours) at 09/21/2022 0856 Last data filed at 09/21/2022 0819 Gross per 24 hour  Intake 2226.37 ml  Output 650 ml  Net 1576.37 ml    Intake/Output from previous day: 09/05 0701 - 09/06 0700 In: 2200.4 [I.V.:122.5; Blood:767; IV Piggyback:1310.9] Out: 650 [Stool:650] Intake/Output this shift: Total I/O In: 26 [I.V.:2; Blood:24] Out: -    Physical Exam:  General: Alert male in NAD Heart:  Regular rate and irregular rhythm.  Pulmonary: Wheezing noted bilaterally  Abdomen: Soft, nondistended, nontender. Hypoactive bowel sounds. Extremities: +1 extremity edema  Neurologic: Alert and oriented Psych: Pleasant. Cooperative. Insight appears normal.    Principal Problem:   Rectal bleeding Active Problems:   PAF (paroxysmal atrial fibrillation) (HCC)   Symptomatic anemia   Hyperlipidemia   Coronary artery disease   Chronic diastolic CHF (congestive heart failure) (HCC)   Gout   Benign essential hypertension   Acute  renal failure superimposed on stage 4 chronic kidney disease (HCC)   Type 2 diabetes mellitus with hyperglycemia (HCC)   Cirrhosis of liver (HCC)   History of diverticulosis   GAVE (gastric antral vascular ectasia)   Acute on chronic blood loss anemia   Chronic anticoagulation     LOS: 0 days   Margarite Gouge Morene Cecilio ,NP 09/21/2022, 8:56 AM

## 2022-09-21 NOTE — Anesthesia Preprocedure Evaluation (Addendum)
Anesthesia Evaluation  Patient identified by MRN, date of birth, ID band Patient awake    Reviewed: Allergy & Precautions, NPO status , Patient's Chart, lab work & pertinent test results, reviewed documented beta blocker date and time   History of Anesthesia Complications Negative for: history of anesthetic complications  Airway Mallampati: II  TM Distance: >3 FB Neck ROM: Full    Dental no notable dental hx. (+) Dental Advisory Given, Teeth Intact   Pulmonary shortness of breath, former smoker   Pulmonary exam normal breath sounds clear to auscultation       Cardiovascular hypertension, Pt. on medications and Pt. on home beta blockers pulmonary hypertension+ CAD, + Cardiac Stents, + CABG (2013), + Peripheral Vascular Disease and +CHF  Normal cardiovascular exam(-) dysrhythmias Atrial Fibrillation + pacemaker + Valvular Problems/Murmurs  Rhythm:Regular Rate:Normal  Echo 03/2022  1. Left ventricular ejection fraction, by estimation, is 55 to 60%. The left ventricle has normal function. The left ventricle has no regional wall motion abnormalities. There is moderate concentric left ventricular hypertrophy. Left ventricular diastolic parameters are indeterminate.   2. Right ventricular systolic function is moderately reduced. The right ventricular size is normal. There is mildly elevated pulmonary artery systolic pressure. The estimated right ventricular systolic pressure is 39.4 mmHg.   3. Left atrial size was mildly dilated.   4. Right atrial size was mildly dilated.   5. The mitral valve is normal in structure. Trivial mitral valve regurgitation. No evidence of mitral stenosis. Moderate mitral annular calcification.   6. The aortic valve is tricuspid. There is moderate calcification of the aortic valve. Aortic valve regurgitation is not visualized. Aortic valve sclerosis/calcification is present, without any evidence of aortic stenosis.    7. The inferior vena cava is dilated in size with <50% respiratory variability, suggesting right atrial pressure of 15 mmHg.   8. The patient appeared to be in atrial fibrillation.    Cath 01/2021 This result has an attachment that is not available.   Successful PCI to the SVG-OM graft   Multi-vessel coronary artery disease.   Planned PCI of SVG. Dye sparing, biplane.   Femoral micropuncture US guided. Good stick. 89F long  Heparin/plavix   AL1 guide to SVG-OM. BMW advanced to native OM.  Spyder distal embolic protection device attempted to advance, unable to  cross lesion.  PTA with 3.0x20 compliant balloon with good expansion.  Advanced spider 4.0 to mid distal SVG prior to distal anastamosis.  Stented with 3.0x32 Synergy DES, post dilated with 3.5NC at high  pressures.   0% residual, TIMI III flow. Spyder removed without issue.  Manual pressure femoral site - EBL <26mL. No specimens.  Coronary Findings Diagnostic Dominance: Right  Vein Graft To 1st Mrg: Origin to Prox Graft lesion is 80% stenosed. Lesion length: 28 mm. TIMI flow is 3.     Neuro/Psych CVA  negative psych ROS   GI/Hepatic Neg liver ROS,GERD  Medicated,,Rectal bleeding   Endo/Other  diabetes (on Ozempic), Type 2, Insulin Dependent    Renal/GU Renal Insufficiency and ARFRenal disease (Cr 4.03, BUN 91)     Musculoskeletal  (+) Arthritis , Osteoarthritis,    Abdominal   Peds  Hematology  (+) Blood dyscrasia (Hgb 6.7, 2u pRBCs ordered, INR 1.5), anemia   Anesthesia Other Findings Day of surgery medications reviewed with patient.  Reproductive/Obstetrics  Anesthesia Physical Anesthesia Plan  ASA: 4  Anesthesia Plan: MAC   Post-op Pain Management: Minimal or no pain anticipated   Induction: Intravenous  PONV Risk Score and Plan: 1 and Treatment may vary due to age or medical condition and Propofol infusion  Airway Management Planned:  Natural Airway and Nasal Cannula  Additional Equipment: None  Intra-op Plan:   Post-operative Plan:   Informed Consent: I have reviewed the patients History and Physical, chart, labs and discussed the procedure including the risks, benefits and alternatives for the proposed anesthesia with the patient or authorized representative who has indicated his/her understanding and acceptance.     Dental advisory given  Plan Discussed with: CRNA  Anesthesia Plan Comments:          Anesthesia Quick Evaluation

## 2022-09-21 NOTE — Progress Notes (Signed)
PROGRESS NOTE  Edward [name]  DOB: Oct 05, 1940  PCP: Byrd Hesselbach, [place] ZOX:[id number]  DOA: 09/20/2022  LOS: 0 days  Hospital Day: 2  Brief narrative: Edward [name] is a 82 y.o. male with PMH significant for DM2, HTN, HLD, CAD/CABG, chronic diastolic CHF, paroxysmal A-fib s/p PPM on Eliquis, carotid artery occlusion, CKD 4, PAD, peripheral neuropathy, liver cirrhosis, h/o GI bleed, GAVE, GERD, gastritis, diverticulosis 9/5, patient presented to the ED with multiple episodes of profuse rectal bleeding with clots Last dose of Eliquis was on the morning of 9/4  In the ED, patient was afebrile, blood pressure dipped down to 80s Labs showed WC count 9.7, hemoglobin 8, platelet 250, INR 1.5 FOBT positive Creatinine elevated to 4.06, lactic acid level normal, transaminases normal and total bili elevated to 1.6 Patient was given 2 units of PRBC transfusion, started on Protonix drip, IV fluid, IV Rocephin Admitted to [facility] GI was consulted  Subjective: Patient was seen and examined this morning.  Pleasant elderly Caucasian male.  Lying on bed.  Not in distress.  Alert, awake confused but not restless or agitated today.  Wife at bedside. Continues to have rectal bleeding this morning Chart reviewed Remains afebrile, blood pressure in 120s  Assessment and plan: BRBPR H/o GI bleeding -GAVE, gastritis, diverticulosis Presented with BRBPR fatigue Upper versus lower Started on Protonix drip Continues to bleed.  Plan for tagged RBC scan this morning. GI following IV Rocephin for SBP prophylaxis per GI  Acute blood loss anemia  Chronic anemia due to blood loss and CKD Hemoglobin at baseline between 8 and 9.   Presented with a low hemoglobin of 6.5.  Despite units of PRBC transfusion, hemoglobin only minimally improved to 6.7 Further transfusion planned this morning Recent Labs    08/27/22 0529 08/28/22 0527 09/20/22 1429 09/20/22 2111 09/21/22 0631  HGB 9.1* 8.7* 8.0* 6.5* 6.7*   MCV 85.7 85.0 88.7  --  87.6    AKI on CKD 4 Baseline creatinine 3.75 from 3 weeks ago. Present creatinine elevated 4.06 in the setting of GI bleed Continue to monitor  Recent Labs    05/26/22 0112 08/18/22 1422 08/19/22 0746 08/24/22 1543 08/25/22 0838 08/26/22 0530 08/27/22 0529 08/28/22 0527 09/20/22 1429 09/21/22 0631  BUN 36* 61* 59* 71* 71* 69* 70* 71* 96* 91*  CREATININE 2.82* 3.01* 2.67* 3.69* 3.75* 3.61* 3.62* 3.75* 4.06* 4.03*  CO2 23 22 21* 22 19* 21* 21* 22 23 17*   Cirrhosis of liver  LFTs as below.  Obtain ammonia level On ceftriaxone for SBP prophylaxis. Recent Labs  Lab 09/20/22 1429 09/21/22 0631  AST 20 22  ALT 15 14  ALKPHOS 153* 92  BILITOT 1.6* 2.1*  PROT 7.8 5.5*  ALBUMIN 3.1* 2.4*  INR 1.5*  --   PLT 250 211   Acute metabolic encephalopathy Remains confused, oriented to place and person only.  Not restless or agitated.  Per wife at bedside, he was more agitated yesterday. Obtain ammonia level Continue monitor mental status  Type 2 diabetes mellitus A1c 5 on 08/18/22 PTA meds-Ozempic weekly,, Lantus 48 units daily Start SSI/Accu-Cheks.  May need long-acting insulin as well depending on blood sugar trend Recent Labs  Lab 09/20/22 2142 09/21/22 0210 09/21/22 0736  GLUCAP 120* 124* 139*   Hypertension H/o HTN, diastolic CHF Blood pressure low in 80s overnight. PTA meds-Coreg 6.25 mg twice daily, Imdur 30 mg daily, Lasix 60 mg a.m. 40 mg p.m.  Aldactone 25 mg daily, Currently only on low-dose  Coreg 3.125 mg twice daily.  Imdur, Lasix and Aldactone on hold.  PAF (paroxysmal atrial fibrillation) Continue low-dose Coreg for rate control  Eliquis on hold.  Last dose was on the morning of 9/4  CAD/CABG PAD HLD Not on statin because of liver cirrhosis Eliquis plan as above Continue beta-blocker  Hypothyroidism Continue Synthroid  Gout On allopurinol.    Mobility: Independent at home.  Goals of care   Code Status: Full Code      DVT prophylaxis: SCDs Place and maintain sequential compression device Start: 09/21/22 1123   Antimicrobials: Prophylactic IV Rocephin Fluid: NS at 50 Consultants: GI Family Communication: Wife at bedside  Status: Observation Level of care:  Stepdown   Patient is from: Home Needs to continue in-hospital care: GI bleeding workup ongoing Anticipated d/c to: Hopefully Home after clinical stabilization      Diet:  Diet Order             Diet NPO time specified Except for: Sips with Meds  Diet effective ____                   Scheduled Meds:  carvedilol  3.125 mg Oral BID WC   Chlorhexidine Gluconate Cloth  6 each Topical Daily   insulin aspart  0-5 Units Subcutaneous QHS   insulin aspart  0-9 Units Subcutaneous TID WC   pantoprazole (PROTONIX) IV  40 mg Intravenous Q24H    PRN meds: acetaminophen **OR** acetaminophen, lip balm, ondansetron **OR** ondansetron (ZOFRAN) IV   Infusions:   sodium chloride 50 mL/hr at 09/21/22 0151   ceFEPime (MAXIPIME) IV     metronidazole 500 mg (09/21/22 1052)    Antimicrobials: Anti-infectives (From admission, onward)    Start     Dose/Rate Route Frequency Ordered Stop   09/21/22 2200  ceFEPIme (MAXIPIME) 2 g in sodium chloride 0.9 % 100 mL IVPB        2 g 200 mL/hr over 30 Minutes Intravenous Every 24 hours 09/20/22 2321     09/21/22 1600  cefTRIAXone (ROCEPHIN) 2 g in sodium chloride 0.9 % 100 mL IVPB  Status:  Discontinued        2 g 200 mL/hr over 30 Minutes Intravenous Every 24 hours 09/20/22 1619 09/20/22 1705   09/21/22 1600  cefTRIAXone (ROCEPHIN) 1 g in sodium chloride 0.9 % 100 mL IVPB  Status:  Discontinued        1 g 200 mL/hr over 30 Minutes Intravenous Every 24 hours 09/20/22 1705 09/20/22 2147   09/20/22 2245  ceFEPIme (MAXIPIME) 2 g in sodium chloride 0.9 % 100 mL IVPB        2 g 200 mL/hr over 30 Minutes Intravenous  Once 09/20/22 2147 09/20/22 2327   09/20/22 2245  metroNIDAZOLE (FLAGYL) IVPB 500 mg         500 mg 100 mL/hr over 60 Minutes Intravenous Every 12 hours 09/20/22 2147 09/27/22 2229   09/20/22 1630  cefTRIAXone (ROCEPHIN) 1 g in sodium chloride 0.9 % 100 mL IVPB  Status:  Discontinued        1 g 200 mL/hr over 30 Minutes Intravenous  Once 09/20/22 1626 09/20/22 1641   09/20/22 1445  cefTRIAXone (ROCEPHIN) 1 g in sodium chloride 0.9 % 100 mL IVPB        1 g 200 mL/hr over 30 Minutes Intravenous  Once 09/20/22 1432 09/20/22 1551       Nutritional status:  Body mass index is 28.79 kg/m.  Objective: Vitals:   09/21/22 1000 09/21/22 1026  BP: (!) 134/51 (!) 134/52  Pulse: 78 81  Resp: 15 13  Temp:  98.1 F (36.7 C)  SpO2: 100% 100%    Intake/Output Summary (Last 24 hours) at 09/21/2022 1124 Last data filed at 09/21/2022 1026 Gross per 24 hour  Intake 2645.7 ml  Output 650 ml  Net 1995.7 ml   Filed Weights   09/20/22 1348  Weight: 91 kg   Weight change:  Body mass index is 28.79 kg/m.   Physical Exam: General exam: Pleasant, elderly Caucasian male.  Not in physical distress Skin: No rashes, lesions or ulcers. HEENT: Atraumatic, normocephalic, no obvious bleeding Lungs: Clear to auscultation bilaterally CVS: Regular rate and rhythm, no murmur GI/Abd soft, nontender, nondistended, bowel sound present CNS: Alert, awake, oriented to place and person only not to time, Psychiatry: Mood appropriate Extremities: No pedal edema, no calf tenderness  Data Review: I have personally reviewed the laboratory data and studies available.  F/u labs ordered Unresulted Labs (From admission, onward)     Start     Ordered   09/22/22 0500  CBC with Differential/Platelet  Daily,   R      09/21/22 0835   09/22/22 0500  Comprehensive metabolic panel  Daily,   R      09/21/22 0835   09/22/22 0500  Ammonia  Tomorrow morning,   R        09/21/22 0835            Total time spent in review of labs and imaging, patient evaluation, formulation of plan,  documentation and communication with family: 55 minutes  Signed, Lorin Glass, MD [facility] 09/21/2022

## 2022-09-22 ENCOUNTER — Encounter (HOSPITAL_COMMUNITY): Payer: Self-pay | Admitting: Internal Medicine

## 2022-09-22 ENCOUNTER — Encounter (HOSPITAL_COMMUNITY)
Admission: EM | Disposition: A | Discharge status: Home / Self Care | Payer: Self-pay | Source: Home / Self Care | Attending: Internal Medicine

## 2022-09-22 ENCOUNTER — Inpatient Hospital Stay (HOSPITAL_COMMUNITY): Payer: No Typology Code available for payment source | Admitting: Anesthesiology

## 2022-09-22 DIAGNOSIS — K449 Diaphragmatic hernia without obstruction or gangrene: Secondary | ICD-10-CM

## 2022-09-22 DIAGNOSIS — K2289 Other specified disease of esophagus: Secondary | ICD-10-CM

## 2022-09-22 DIAGNOSIS — K625 Hemorrhage of anus and rectum: Secondary | ICD-10-CM | POA: Diagnosis not present

## 2022-09-22 DIAGNOSIS — I8500 Esophageal varices without bleeding: Secondary | ICD-10-CM

## 2022-09-22 DIAGNOSIS — K259 Gastric ulcer, unspecified as acute or chronic, without hemorrhage or perforation: Secondary | ICD-10-CM

## 2022-09-22 DIAGNOSIS — I8510 Secondary esophageal varices without bleeding: Secondary | ICD-10-CM

## 2022-09-22 DIAGNOSIS — K31819 Angiodysplasia of stomach and duodenum without bleeding: Secondary | ICD-10-CM | POA: Diagnosis not present

## 2022-09-22 HISTORY — PX: HOT HEMOSTASIS: SHX5433

## 2022-09-22 HISTORY — PX: ESOPHAGOGASTRODUODENOSCOPY (EGD) WITH PROPOFOL: SHX5813

## 2022-09-22 LAB — COMPREHENSIVE METABOLIC PANEL
ALT: 16 U/L (ref 0–44)
AST: 22 U/L (ref 15–41)
Albumin: 2.5 g/dL — ABNORMAL LOW (ref 3.5–5.0)
Alkaline Phosphatase: 91 U/L (ref 38–126)
Anion gap: 9 (ref 5–15)
BUN: 84 mg/dL — ABNORMAL HIGH (ref 8–23)
CO2: 17 mmol/L — ABNORMAL LOW (ref 22–32)
Calcium: 7.8 mg/dL — ABNORMAL LOW (ref 8.9–10.3)
Chloride: 111 mmol/L (ref 98–111)
Creatinine, Ser: 3.75 mg/dL — ABNORMAL HIGH (ref 0.61–1.24)
GFR, Estimated: 16 mL/min — ABNORMAL LOW (ref >60)
Glucose, Bld: 112 mg/dL — ABNORMAL HIGH (ref 70–99)
Potassium: 4.3 mmol/L (ref 3.5–5.1)
Sodium: 137 mmol/L (ref 135–145)
Total Bilirubin: 1.2 mg/dL (ref 0.3–1.2)
Total Protein: 5.9 g/dL — ABNORMAL LOW (ref 6.5–8.1)

## 2022-09-22 LAB — CBC WITH DIFFERENTIAL/PLATELET
Abs Immature Granulocytes: 0.23 10*3/uL — ABNORMAL HIGH (ref 0.00–0.07)
Basophils Absolute: 0.1 10*3/uL (ref 0.0–0.1)
Basophils Relative: 0 %
Eosinophils Absolute: 0.6 10*3/uL — ABNORMAL HIGH (ref 0.0–0.5)
Eosinophils Relative: 4 %
HCT: 23.3 % — ABNORMAL LOW (ref 39.0–52.0)
Hemoglobin: 7.4 g/dL — ABNORMAL LOW (ref 13.0–17.0)
Immature Granulocytes: 2 %
Lymphocytes Relative: 7 %
Lymphs Abs: 1 10*3/uL (ref 0.7–4.0)
MCH: 28.1 pg (ref 26.0–34.0)
MCHC: 31.8 g/dL (ref 30.0–36.0)
MCV: 88.6 fL (ref 80.0–100.0)
Monocytes Absolute: 1.5 10*3/uL — ABNORMAL HIGH (ref 0.1–1.0)
Monocytes Relative: 11 %
Neutro Abs: 10.5 10*3/uL — ABNORMAL HIGH (ref 1.7–7.7)
Neutrophils Relative %: 76 %
Platelets: 161 10*3/uL (ref 150–400)
RBC: 2.63 MIL/uL — ABNORMAL LOW (ref 4.22–5.81)
RDW: 19.8 % — ABNORMAL HIGH (ref 11.5–15.5)
WBC: 13.8 10*3/uL — ABNORMAL HIGH (ref 4.0–10.5)
nRBC: 0.2 % (ref 0.0–0.2)

## 2022-09-22 LAB — PREPARE RBC (CROSSMATCH)

## 2022-09-22 LAB — CBC
HCT: 27.4 % — ABNORMAL LOW (ref 39.0–52.0)
Hemoglobin: 8.4 g/dL — ABNORMAL LOW (ref 13.0–17.0)
MCH: 27.8 pg (ref 26.0–34.0)
MCHC: 30.7 g/dL (ref 30.0–36.0)
MCV: 90.7 fL (ref 80.0–100.0)
Platelets: 172 10*3/uL (ref 150–400)
RBC: 3.02 MIL/uL — ABNORMAL LOW (ref 4.22–5.81)
RDW: 19.9 % — ABNORMAL HIGH (ref 11.5–15.5)
WBC: 15 10*3/uL — ABNORMAL HIGH (ref 4.0–10.5)
nRBC: 0.3 % — ABNORMAL HIGH (ref 0.0–0.2)

## 2022-09-22 LAB — GLUCOSE, CAPILLARY
Glucose-Capillary: 129 mg/dL — ABNORMAL HIGH (ref 70–99)
Glucose-Capillary: 127 mg/dL — ABNORMAL HIGH (ref 70–99)
Glucose-Capillary: 206 mg/dL — ABNORMAL HIGH (ref 70–99)
Glucose-Capillary: 117 mg/dL — ABNORMAL HIGH (ref 70–99)

## 2022-09-22 LAB — AMMONIA: Ammonia: 43 umol/L — ABNORMAL HIGH (ref 9–35)

## 2022-09-22 LAB — PROTIME-INR
INR: 1.6 — ABNORMAL HIGH (ref 0.8–1.2)
Prothrombin Time: 19.3 s — ABNORMAL HIGH (ref 11.4–15.2)

## 2022-09-22 LAB — HEMOGLOBIN AND HEMATOCRIT, BLOOD
HCT: 23 % — ABNORMAL LOW (ref 39.0–52.0)
Hemoglobin: 7.2 g/dL — ABNORMAL LOW (ref 13.0–17.0)

## 2022-09-22 SURGERY — ESOPHAGOGASTRODUODENOSCOPY (EGD) WITH PROPOFOL
Anesthesia: Monitor Anesthesia Care

## 2022-09-22 MED ORDER — PROPOFOL 10 MG/ML IV BOLUS
INTRAVENOUS | Status: DC | PRN
  Administered 2022-09-22: 20 mg via INTRAVENOUS
  Administered 2022-09-22: 40 mg via INTRAVENOUS

## 2022-09-22 MED ORDER — SODIUM CHLORIDE 0.9% IV SOLUTION: Freq: Once | INTRAVENOUS | Status: DC

## 2022-09-22 MED ORDER — PANTOPRAZOLE SODIUM 40 MG IV SOLR
40.0000 mg | Freq: Two times a day (BID) | INTRAVENOUS | Status: DC
  Administered 2022-09-22: 40 mg via INTRAVENOUS
  Filled 2022-09-22 (×2): qty 10

## 2022-09-22 MED ORDER — LACTATED RINGERS IV SOLN: INTRAVENOUS | Status: DC | PRN

## 2022-09-22 MED ORDER — PROPOFOL 500 MG/50ML IV EMUL
INTRAVENOUS | Status: DC | PRN
  Administered 2022-09-22: 80 ug/kg/min via INTRAVENOUS

## 2022-09-22 MED ORDER — POLYETHYLENE GLYCOL 3350 17 G PO PACK
17.0000 g | PACK | Freq: Every day | ORAL | Status: DC
  Administered 2022-09-22: 17 g via ORAL
  Filled 2022-09-22 (×2): qty 1

## 2022-09-22 MED ORDER — LIDOCAINE 2% (20 MG/ML) 5 ML SYRINGE
INTRAMUSCULAR | Status: DC | PRN
  Administered 2022-09-22: 40 mg via INTRAVENOUS

## 2022-09-22 MED ORDER — SODIUM CHLORIDE 0.9 % IV SOLN: INTRAVENOUS | Status: DC

## 2022-09-22 MED ORDER — SUCRALFATE 1 G PO TABS
1.0000 g | ORAL_TABLET | Freq: Three times a day (TID) | ORAL | Status: DC
  Administered 2022-09-22 – 2022-09-23 (×4): 1 g via ORAL
  Filled 2022-09-22 (×4): qty 1

## 2022-09-22 SURGICAL SUPPLY — 15 items

## 2022-09-22 NOTE — Plan of Care (Signed)
  Problem: Education: Goal: Knowledge of General Education information will improve Description: Including pain rating scale, medication(s)/side effects and non-pharmacologic comfort measures Outcome: Progressing   Problem: Health Behavior/Discharge Planning: Goal: Ability to manage health-related needs will improve Outcome: Progressing   Problem: Clinical Measurements: Goal: Ability to maintain clinical measurements within normal limits will improve Outcome: Progressing Goal: Will remain free from infection Outcome: Progressing Goal: Diagnostic test results will improve Outcome: Progressing   Problem: Coping: Goal: Level of anxiety will decrease Outcome: Progressing   Problem: Elimination: Goal: Will not experience complications related to urinary retention Outcome: Progressing   Problem: Pain Managment: Goal: General experience of comfort will improve Outcome: Progressing   Problem: Safety: Goal: Ability to remain free from injury will improve Outcome: Progressing

## 2022-09-22 NOTE — Progress Notes (Signed)
PROGRESS NOTE  Edward [name]  DOB: 03-Dec-1940  PCP: Byrd Hesselbach, [place] WUJ:[id number]  DOA: 09/20/2022  LOS: 1 day  Hospital Day: 3  Brief narrative: Edward [name] is a 82 y.o. male with PMH significant for DM2, HTN, HLD, CAD/CABG, chronic diastolic CHF, paroxysmal A-fib s/p PPM on Eliquis, carotid artery occlusion, CKD 4, PAD, peripheral neuropathy, liver cirrhosis, h/o GI bleed, GAVE, GERD, gastritis, diverticulosis 9/5, patient presented to the ED with multiple episodes of profuse rectal bleeding with clots Last dose of Eliquis was on the morning of 9/4  In the ED, patient was afebrile, blood pressure dipped down to 80s Labs showed WC count 9.7, hemoglobin 8, platelet 250, INR 1.5 FOBT positive Creatinine elevated to 4.06, lactic acid level normal, transaminases normal and total bili elevated to 1.6 Patient was given 2 units of PRBC transfusion, started on Protonix drip, IV fluid, IV Rocephin Admitted to [facility] GI was consulted  Subjective: Patient was seen and examined this morning.   Lying on bed.  Not in distress.  Wife at bedside.   Per RN, patient had 1 episode of small bloody bowel movement overnight. Given monitor PRBC this morning Under went EGD earlier this afternoon.  Report as below.  Assessment and plan: BRBPR H/o GI bleeding -GAVE, gastritis, diverticulosis Presented with BRBPR 9/6, tagged RBC scan negative 9/7, underwent EGD with GI.  Per note, patient has healing ulcers from prior RFA, overall improving GAVE. Patient still has maroonish stool, likely of diverticular origin.  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   Presented with a low hemoglobin of 6.5.  Received a total of 5 units PRBCs to 5.  Last unit this morning.  Continue to monitor. Recent Labs    09/20/22 2111 09/21/22 0631 09/21/22 2008 09/22/22 0203 09/22/22 0744   HGB 6.5* 6.7* 8.0* 7.4* 7.2*  MCV  --  87.6  --  88.6  --     AKI on CKD 4 Baseline creatinine 3.75 from 3 weeks ago. Present creatinine elevated 4.06 in the setting of GI bleed Gradually improving.  Continue to monitor  Recent Labs    08/18/22 1422 08/19/22 0746 08/24/22 1543 08/25/22 9562 08/26/22 0530 08/27/22 0529 08/28/22 0527 09/20/22 1429 09/21/22 0631 09/22/22 0203  BUN 61* 59* 71* 71* 69* 70* 71* 96* 91* 84*  CREATININE 3.01* 2.67* 3.69* 3.75* 3.61* 3.62* 3.75* 4.06* 4.03* 3.75*  CO2 22 21* 22 19* 21* 21* 22 23 17* 17*   Cirrhosis of liver  LFTs as below.  Ammonia was slightly elevated.  Continues to have bowel movements Recent Labs  Lab 09/20/22 1429 09/21/22 0631 09/22/22 0203  AST 20 22 22   ALT 15 14 16   ALKPHOS 153* 92 91  BILITOT 1.6* 2.1* 1.2  PROT 7.8 5.5* 5.9*  ALBUMIN 3.1* 2.4* 2.5*  AMMONIA  --   --  43*  INR 1.5*  --  1.6*  PLT 250 211 161   Acute metabolic encephalopathy Gradually improving mental status.  Continue to monitor  Type 2 diabetes mellitus A1c 5 on 08/18/22 PTA meds-Ozempic weekly, Lantus 48 units daily Start SSI/Accu-Cheks.  May need long-acting insulin as well depending on blood sugar trend Recent Labs  Lab 09/21/22 0736 09/21/22 1739 09/21/22 2145 09/22/22 0824 09/22/22 1134  GLUCAP 139* 120* 130* 129* 127*   Hypertension H/o HTN, diastolic CHF Blood pressure low in  80s overnight. PTA meds-Coreg 6.25 mg twice daily, Imdur 30 mg daily, Lasix 60 mg a.m. 40 mg p.m., Aldactone 25 mg daily. Currently only on low-dose Coreg 3.125 mg twice daily.  Imdur, Lasix and Aldactone on hold. Blood pressure remains mostly in low normal range.  Continue to monitor  PAF (paroxysmal atrial fibrillation) Continue low-dose Coreg for rate control  Eliquis on hold.  Last dose was on the morning of 9/4  CAD/CABG PAD HLD Not on statin because of liver cirrhosis Eliquis plan as above Continue beta-blocker  Hypothyroidism Continue  Synthroid  Gout On allopurinol.   Mobility: Independent at home.  PT eval ordered  Goals of care   Code Status: Full Code     DVT prophylaxis: SCDs Place and maintain sequential compression device Start: 09/21/22 1123   Antimicrobials: Prophylactic IV Rocephin Fluid: None Consultants: GI Family Communication: Wife at bedside  Status: Inpatient Level of care:  Stepdown   Patient is from: Home Needs to continue in-hospital care: GI bleeding workup ongoing Anticipated d/c to: Hopefully Home after clinical stabilization    Diet:  Diet Order             Diet full liquid Fluid consistency: Thin  Diet effective now                   Scheduled Meds:  [MAR Hold] sodium chloride   Intravenous Once   [MAR Hold] carvedilol  3.125 mg Oral BID WC   [MAR Hold] Chlorhexidine Gluconate Cloth  6 each Topical Daily   [MAR Hold] insulin aspart  0-5 Units Subcutaneous QHS   [MAR Hold] insulin aspart  0-9 Units Subcutaneous TID WC   pantoprazole (PROTONIX) IV  40 mg Intravenous Q12H   sucralfate  1 g Oral TID WC & HS    PRN meds: [MAR Hold] acetaminophen **OR** [MAR Hold] acetaminophen, [MAR Hold] lip balm, [MAR Hold]  morphine injection, [MAR Hold] ondansetron **OR** [MAR Hold] ondansetron (ZOFRAN) IV   Infusions:   sodium chloride 20 mL/hr at 09/22/22 1036   [MAR Hold] cefTRIAXone (ROCEPHIN)  IV Stopped (09/21/22 2244)    Antimicrobials: Anti-infectives (From admission, onward)    Start     Dose/Rate Route Frequency Ordered Stop   09/21/22 2200  ceFEPIme (MAXIPIME) 2 g in sodium chloride 0.9 % 100 mL IVPB  Status:  Discontinued        2 g 200 mL/hr over 30 Minutes Intravenous Every 24 hours 09/20/22 2321 09/21/22 1438   09/21/22 2200  [MAR Hold]  cefTRIAXone (ROCEPHIN) 2 g in sodium chloride 0.9 % 100 mL IVPB        (MAR Hold since Sat 09/22/2022 at 1309.Hold Reason: Transfer to a Procedural area)   2 g 200 mL/hr over 30 Minutes Intravenous Every 24 hours 09/21/22 1438  09/25/22 2159   09/21/22 1600  cefTRIAXone (ROCEPHIN) 2 g in sodium chloride 0.9 % 100 mL IVPB  Status:  Discontinued        2 g 200 mL/hr over 30 Minutes Intravenous Every 24 hours 09/20/22 1619 09/20/22 1705   09/21/22 1600  cefTRIAXone (ROCEPHIN) 1 g in sodium chloride 0.9 % 100 mL IVPB  Status:  Discontinued        1 g 200 mL/hr over 30 Minutes Intravenous Every 24 hours 09/20/22 1705 09/20/22 2147   09/20/22 2245  ceFEPIme (MAXIPIME) 2 g in sodium chloride 0.9 % 100 mL IVPB        2 g 200 mL/hr over 30 Minutes  Intravenous  Once 09/20/22 2147 09/20/22 2327   09/20/22 2245  metroNIDAZOLE (FLAGYL) IVPB 500 mg  Status:  Discontinued        500 mg 100 mL/hr over 60 Minutes Intravenous Every 12 hours 09/20/22 2147 09/21/22 1438   09/20/22 1630  cefTRIAXone (ROCEPHIN) 1 g in sodium chloride 0.9 % 100 mL IVPB  Status:  Discontinued        1 g 200 mL/hr over 30 Minutes Intravenous  Once 09/20/22 1626 09/20/22 1641   09/20/22 1445  cefTRIAXone (ROCEPHIN) 1 g in sodium chloride 0.9 % 100 mL IVPB        1 g 200 mL/hr over 30 Minutes Intravenous  Once 09/20/22 1432 09/20/22 1551       Nutritional status:  Body mass index is 28.79 kg/m.          Objective: Vitals:   09/22/22 1410 09/22/22 1420  BP: (!) 111/39 (!) 119/56  Pulse: 65 62  Resp: 17 10  Temp:    SpO2: 100% 100%    Intake/Output Summary (Last 24 hours) at 09/22/2022 1424 Last data filed at 09/22/2022 1357 Gross per 24 hour  Intake 1312.94 ml  Output 575 ml  Net 737.94 ml   Filed Weights   09/20/22 1348  Weight: 91 kg   Weight change:  Body mass index is 28.79 kg/m.   Physical Exam: General exam: Pleasant, elderly Caucasian male.  Not in physical distress Skin: No rashes, lesions or ulcers. HEENT: Atraumatic, normocephalic, no obvious bleeding Lungs: Clear to auscultation bilaterally CVS: Regular rate and rhythm, no murmur GI/Abd soft, nontender, nondistended, bowel sound present CNS: Alert, awake, oriented  to place and person only not to time, Psychiatry: Seemingly frustrated Extremities: No pedal edema, no calf tenderness  Data Review: I have personally reviewed the laboratory data and studies available.  F/u labs ordered Unresulted Labs (From admission, onward)     Start     Ordered   09/22/22 1600  CBC  Once,   R       Question:  Specimen collection method  Answer:  Lab=Lab collect   09/22/22 1410   09/22/22 0500  CBC with Differential/Platelet  Daily,   R      09/21/22 0835   09/22/22 0500  Comprehensive metabolic panel  Daily,   R      09/21/22 0835            Total time spent in review of labs and imaging, patient evaluation, formulation of plan, documentation and communication with family: 45 minutes  Signed, Lorin Glass, MD [facility] 09/22/2022

## 2022-09-22 NOTE — Interval H&P Note (Signed)
History and Physical Interval Note:  09/22/2022 1:22 PM  Edward [name]  has presented today for surgery, with the diagnosis of anemia, rectal bleeding.  The various methods of treatment have been discussed with the patient and family. After consideration of risks, benefits and other options for treatment, the patient has consented to  Procedure(s): ESOPHAGOGASTRODUODENOSCOPY (EGD) WITH PROPOFOL (N/A) as a surgical intervention.  The patient's history has been reviewed, patient examined, no change in status, stable for surgery.  I have reviewed the patient's chart and labs.  Questions were answered to the patient's satisfaction.     [employer]

## 2022-09-22 NOTE — Op Note (Signed)
[facility] Patient Name: Edward [name] Procedure Date: 09/22/2022 MRN: [id number] Attending MD: Corliss Parish , MD, [id number] Date of Birth: 06-Jul-1940 CSN: [id number] Age: 82 Admit Type: Inpatient Procedure:                Upper GI endoscopy Indications:              Hematochezia, Melena, Occult blood in stool, Recent                            gastrointestinal bleeding Providers:                Corliss Parish, MD, Roselie Awkward, RN, Marja Kays, Technician Referring MD:             Inpatient Medical Service Medicines:                Monitored Anesthesia Care Complications:            No immediate complications. Estimated Blood Loss:     Estimated blood loss: none. Procedure:                Pre-Anesthesia Assessment:                           - Prior to the procedure, a History and Physical                            was performed, and patient medications and                            allergies were reviewed. The patient's tolerance of                            previous anesthesia was also reviewed. The risks                            and benefits of the procedure and the sedation                            options and risks were discussed with the patient.                            All questions were answered, and informed consent                            was obtained. Prior Anticoagulants: The patient has                            taken Eliquis (apixaban), last dose was 3 days                            prior to procedure. ASA Grade Assessment: III - A  patient with severe systemic disease. After                            reviewing the risks and benefits, the patient was                            deemed in satisfactory condition to undergo the                            procedure.                           After obtaining informed consent, the endoscope was                            passed  under direct vision. Throughout the                            procedure, the patient's blood pressure, pulse, and                            oxygen saturations were monitored continuously. The                            [id number]-H190 ([id number]) Olympus endoscope was introduced                            through the mouth, and advanced to the second part                            of duodenum. The upper GI endoscopy was                            accomplished without difficulty. The patient                            tolerated the procedure. Scope In: Scope Out: Findings:      No gross lesions were noted in the proximal esophagus and in the mid       esophagus.      Grade I varices were found in the distal esophagus.      The Z-line was irregular and was found 40 cm from the incisors.      A 2 cm hiatal hernia was present.      Multiple non-bleeding cratered, linear and superficial gastric ulcers       with a clean ulcer base (Forrest Class III) were found in the gastric       antrum. The largest lesion was 20 mm in largest dimension. 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 This is greatly       improved from prior since last RFA session where disease was throughout       the antrum. Coagulation for bleeding prevention using argon  plasma was       successful to the remaining portion.      No gross lesions were noted in the duodenal bulb, in the first portion       of the duodenum and in the second portion of the duodenum. Impression:               - No gross lesions in the proximal esophagus and in                            the mid esophagus.                           - Grade I esophageal varices found distally.                           - Z-line irregular, 40 cm from the incisors.                           - 2 cm hiatal hernia.                            - Non-bleeding gastric ulcers with a clean ulcer                            base (Forrest Class III) - from recent RFA still in                            process of healing.                           - Gastric antral vascular ectasia without bleeding                            found in prepylorus - but comparatively this is                            greatly improved since initial RFA treatment.                            Treated with argon plasma coagulation (APC) the                            remaining portion.                           - No gross lesions in the duodenal bulb, in the                            first portion of the duodenum and in the second                            portion of the duodenum. Moderate Sedation:      Not Applicable - Patient had care per Anesthesia. Recommendation:           - The patient will  be observed post-procedure,                            until all discharge criteria are met.                           - Return patient to hospital ward for ongoing care.                           - Full liquid diet.                           - IV PPI through tomorrow morning. Then may                            transition back to oral PPI 40 mg twice daily.                           - Carafate 1 g 4 times daily for 2 weeks then twice                            daily to be maintained for at least 1 month.                           -May stop octreotide.                           -Ceftriaxone or ciprofloxacin x 5-day course in                            total since coming into the hospital to decrease                            risk of infectious complications.                           - Minimize nonsteroidal medications.                           - If anticoagulation were to need to be considered                            to be restarted, would wait at least 1 week with                            the ulcers present to allow further healing (though                             could consider IV heparin as inpatient prior to                            discharge if this is going to be  reinitiated)?"patient stated that he and wife did                            not want to restart this, and that we will need to                            be a discussion with the patient's medicine service                            as well as primary cardiologist as an outpatient.                           - On his rectal exam today he has maroon stool, but                            had not had any significant bowel movements since                            yesterday in the evening. I suspect that this is                            still old blood that we will need to make its way                            out. However in the off chance that the patient                            still has evidence of a recent diverticular                            hemorrhage, I will keep him on full liquid diet in                            case he needs to have a rapid purge preparation                            performed if things do not improve.                           - I would recommend in this individual a dose of IV                            iron to try to optimize his stores.                           - Repeat upper endoscopy in 2 months with primary                            gastroenterologist or at [facility] is reasonable as the  majority of his GAVE has been adequately treated at                            this point and so once his ulcers heal he should                            have further improvement in his overall                            symptomatology and anemia.                           - The findings and recommendations were discussed                            with the patient.                           - The findings and recommendations were discussed                            with the patient's  family.                           - The findings and recommendations were discussed                            with the referring physician. Procedure Code(s):        --- Professional ---                           [phone], Esophagogastroduodenoscopy, flexible,                            transoral; with control of bleeding, any method Diagnosis Code(s):        --- Professional ---                           I85.00, Esophageal varices without bleeding                           K22.89, Other specified disease of esophagus                           K44.9, Diaphragmatic hernia without obstruction or                            gangrene                           K25.9, Gastric ulcer, unspecified as acute or                            chronic, without hemorrhage or perforation  K31.819, Angiodysplasia of stomach and duodenum                            without bleeding                           K92.1, Melena (includes Hematochezia)                           R19.5, Other fecal abnormalities                           K92.2, Gastrointestinal hemorrhage, unspecified CPT copyright 2022 [employer]. All rights reserved. The codes documented in this report are preliminary and upon coder review may  be revised to meet current compliance requirements. Corliss Parish, MD 09/22/2022 2:11:26 PM Number of Addenda: 0

## 2022-09-22 NOTE — Transfer of Care (Signed)
Immediate Anesthesia Transfer of Care Note  Patient: Edward [name]  Procedure(s) Performed: ESOPHAGOGASTRODUODENOSCOPY (EGD) WITH PROPOFOL HOT HEMOSTASIS (ARGON PLASMA COAGULATION/BICAP)  Patient Location: PACU  Anesthesia Type:MAC  Level of Consciousness: awake, alert , oriented, and patient cooperative  Airway & Oxygen Therapy: Patient Spontanous Breathing and Patient connected to face mask oxygen  Post-op Assessment: Report given to RN and Post -op Vital signs reviewed and stable  Post vital signs: Reviewed and stable  Last Vitals:  Vitals Value Taken Time  BP 115/41 09/22/22 1403  Temp    Pulse 60 09/22/22 1404  Resp 18 09/22/22 1404  SpO2 100 % 09/22/22 1404  Vitals shown include unfiled device data.  Last Pain:  Vitals:   09/22/22 1310  TempSrc: Oral  PainSc: 0-No pain         Complications: No notable events documented.

## 2022-09-22 NOTE — Anesthesia Postprocedure Evaluation (Signed)
Anesthesia Post Note  Patient: Norvel Shala Coopman  Procedure(s) Performed: ESOPHAGOGASTRODUODENOSCOPY (EGD) WITH PROPOFOL HOT HEMOSTASIS (ARGON PLASMA COAGULATION/BICAP)     Patient location during evaluation: PACU Anesthesia Type: MAC Level of consciousness: awake and alert Pain management: pain level controlled Vital Signs Assessment: post-procedure vital signs reviewed and stable Respiratory status: spontaneous breathing, nonlabored ventilation and respiratory function stable Cardiovascular status: blood pressure returned to baseline and stable Postop Assessment: no apparent nausea or vomiting Anesthetic complications: no   No notable events documented.  Last Vitals:  Vitals:   09/22/22 1410 09/22/22 1420  BP: (!) 111/39 (!) 119/56  Pulse: 65 62  Resp: 17 10  Temp:    SpO2: 100% 100%    Last Pain:  Vitals:   09/22/22 1420  TempSrc:   PainSc: 0-No pain                 Lowella Curb

## 2022-09-23 DIAGNOSIS — K625 Hemorrhage of anus and rectum: Secondary | ICD-10-CM | POA: Diagnosis not present

## 2022-09-23 DIAGNOSIS — K5731 Diverticulosis of large intestine without perforation or abscess with bleeding: Secondary | ICD-10-CM

## 2022-09-23 DIAGNOSIS — K259 Gastric ulcer, unspecified as acute or chronic, without hemorrhage or perforation: Secondary | ICD-10-CM | POA: Diagnosis not present

## 2022-09-23 DIAGNOSIS — K31819 Angiodysplasia of stomach and duodenum without bleeding: Secondary | ICD-10-CM | POA: Diagnosis not present

## 2022-09-23 LAB — CBC
HCT: 27.1 % — ABNORMAL LOW (ref 39.0–52.0)
Hemoglobin: 8.3 g/dL — ABNORMAL LOW (ref 13.0–17.0)
MCH: 28.3 pg (ref 26.0–34.0)
MCHC: 30.6 g/dL (ref 30.0–36.0)
MCV: 92.5 fL (ref 80.0–100.0)
Platelets: 162 10*3/uL (ref 150–400)
RBC: 2.93 MIL/uL — ABNORMAL LOW (ref 4.22–5.81)
RDW: 20.4 % — ABNORMAL HIGH (ref 11.5–15.5)
WBC: 12.2 10*3/uL — ABNORMAL HIGH (ref 4.0–10.5)
nRBC: 0.2 % (ref 0.0–0.2)

## 2022-09-23 LAB — COMPREHENSIVE METABOLIC PANEL
ALT: 17 U/L (ref 0–44)
AST: 23 U/L (ref 15–41)
Albumin: 2.6 g/dL — ABNORMAL LOW (ref 3.5–5.0)
Alkaline Phosphatase: 92 U/L (ref 38–126)
Anion gap: 9 (ref 5–15)
BUN: 85 mg/dL — ABNORMAL HIGH (ref 8–23)
CO2: 18 mmol/L — ABNORMAL LOW (ref 22–32)
Calcium: 7.9 mg/dL — ABNORMAL LOW (ref 8.9–10.3)
Chloride: 107 mmol/L (ref 98–111)
Creatinine, Ser: 3.25 mg/dL — ABNORMAL HIGH (ref 0.61–1.24)
GFR, Estimated: 18 mL/min — ABNORMAL LOW (ref >60)
Glucose, Bld: 118 mg/dL — ABNORMAL HIGH (ref 70–99)
Potassium: 4.4 mmol/L (ref 3.5–5.1)
Sodium: 134 mmol/L — ABNORMAL LOW (ref 135–145)
Total Bilirubin: 1.2 mg/dL (ref 0.3–1.2)
Total Protein: 6.2 g/dL — ABNORMAL LOW (ref 6.5–8.1)

## 2022-09-23 LAB — GLUCOSE, CAPILLARY
Glucose-Capillary: 132 mg/dL — ABNORMAL HIGH (ref 70–99)
Glucose-Capillary: 196 mg/dL — ABNORMAL HIGH (ref 70–99)

## 2022-09-23 LAB — LACTIC ACID, PLASMA: Lactic Acid, Venous: 1.6 mmol/L (ref 0.5–1.9)

## 2022-09-23 LAB — PROTIME-INR
INR: 1.4 — ABNORMAL HIGH (ref 0.8–1.2)
Prothrombin Time: 17.4 s — ABNORMAL HIGH (ref 11.4–15.2)

## 2022-09-23 MED ORDER — PANTOPRAZOLE SODIUM 40 MG PO TBEC
40.0000 mg | DELAYED_RELEASE_TABLET | Freq: Two times a day (BID) | ORAL | Status: DC
  Administered 2022-09-23: 40 mg via ORAL
  Filled 2022-09-23: qty 1

## 2022-09-23 MED ORDER — CIPROFLOXACIN HCL 500 MG PO TABS: 250.0000 mg | ORAL_TABLET | Freq: Two times a day (BID) | ORAL | Status: DC

## 2022-09-23 MED ORDER — CIPROFLOXACIN HCL 250 MG PO TABS: 250.0000 mg | ORAL_TABLET | Freq: Two times a day (BID) | ORAL | 0 refills | Status: AC

## 2022-09-23 MED ORDER — CIPROFLOXACIN HCL 500 MG PO TABS
250.0000 mg | ORAL_TABLET | Freq: Two times a day (BID) | ORAL | Status: DC
  Administered 2022-09-23: 250 mg via ORAL
  Filled 2022-09-23: qty 1

## 2022-09-23 MED ORDER — POLYVINYL ALCOHOL 1.4 % OP SOLN
1.0000 [drp] | OPHTHALMIC | Status: DC | PRN
  Filled 2022-09-23: qty 15

## 2022-09-23 MED ORDER — FERROUS GLUCONATE 324 (38 FE) MG PO TABS
324.0000 mg | ORAL_TABLET | Freq: Every day | ORAL | Status: DC
  Administered 2022-09-23: 324 mg via ORAL
  Filled 2022-09-23: qty 1

## 2022-09-23 MED ORDER — FERROUS GLUCONATE 324 (38 FE) MG PO TABS: 324.0000 mg | ORAL_TABLET | Freq: Every day | ORAL | 0 refills | Status: AC

## 2022-09-23 MED ORDER — SODIUM CHLORIDE 0.9 % IV SOLN: 250.0000 mg | Freq: Once | INTRAVENOUS | Status: DC

## 2022-09-23 MED ORDER — SUCRALFATE 1 G PO TABS: 1.0000 g | ORAL_TABLET | Freq: Three times a day (TID) | ORAL | 1 refills | Status: DC

## 2022-09-23 NOTE — Discharge Summary (Signed)
Physician Discharge Summary  Edward [name] NWG:[id number] DOB: April 24, 1940 DOA: 09/20/2022  PCP: Byrd Hesselbach, PA  Admit date: 09/20/2022 Discharge date: 09/23/2022  Admitted From: Home Discharge disposition: Home  Recommendations at discharge:  Continue Protonix twice daily Carafate before every meal plus nightly for 2 weeks and after that Carafate twice daily for 1 month. Continue carvedilol at previous dose.  Continue to monitor blood pressure, pedal edema and shortness of breath at home and gradually resume other medicines as tolerated. Follow-up with GI as an outpatient   Brief narrative: Edward [name] is a 82 y.o. male with PMH significant for DM2, HTN, HLD, CAD/CABG, chronic diastolic CHF, paroxysmal A-fib s/p PPM on Eliquis, carotid artery occlusion, CKD 4, PAD, peripheral neuropathy, liver cirrhosis, h/o GI bleed, GAVE, GERD, gastritis, diverticulosis 9/5, patient presented to the ED with multiple episodes of profuse rectal bleeding with clots Last dose of Eliquis was on the morning of 9/4  In the ED, patient was afebrile, blood pressure dipped down to 80s Labs showed WC count 9.7, hemoglobin 8, platelet 250, INR 1.5 FOBT positive Creatinine elevated to 4.06, lactic acid level normal, transaminases normal and total bili elevated to 1.6 Patient was given 2 units of PRBC transfusion, started on Protonix drip, IV fluid, IV Rocephin Admitted to [facility] GI was consulted.  Subjective: Patient was seen and examined this morning.  Sitting up in recliner.  Not in distress.  Alert, awake, oriented x 3. Wife at bedside. Has not seen any blood in his stools overnight. Hemoglobin stable at 8.3 Patient has been able to get up and walk to the bathroom independently.  He feels ready to go home.  Wife agrees  Assessment and plan: BRBPR H/o GI bleeding -GAVE, gastritis, diverticulosis Presented with BRBPR 9/6, tagged RBC scan negative 9/7, underwent EGD with GI.  Per note, patient  has healing ulcers from prior RFA, overall improving GAVE. Has not seen any blood in his stools overnight. Hemoglobin stable at 8.3 GI follow-up appreciated.  GI recommends holding Eliquis for 3 to 7 days to allow further healing of the ulcers as well as presumed diverticular bleed.  Patient and his wife actually are thinking of never resuming anticoagulation since he has had bleeding with in the past as well. Continue Protonix twice daily.  Also ordered for Carafate before every meal plus nightly for 2 weeks and after that Carafate twice daily for 1 month. Ciprofloxacin for 2 more days.  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   Presented with a low hemoglobin of 6.5.  Received a total of 5 units PRBCs to 5. Hb stable in last 24 hours.  8.3 today. Continue iron supplement Recent Labs    09/21/22 2008 09/22/22 0203 09/22/22 0744 09/22/22 1554 09/23/22 1013  HGB 8.0* 7.4* 7.2* 8.4* 8.3*  MCV  --  88.6  --  90.7 92.5    AKI on CKD 4 Baseline creatinine 3.75 from 3 weeks ago. Presented with creatinine elevated 4.06 in the setting of GI bleed Gradually improving.  Continue to monitor  Recent Labs    08/19/22 0746 08/24/22 1543 08/25/22 5784 08/26/22 0530 08/27/22 0529 08/28/22 0527 09/20/22 1429 09/21/22 0631 09/22/22 0203 09/23/22 0304  BUN 59* 71* 71* 69* 70* 71* 96* 91* 84* 85*  CREATININE 2.67* 3.69* 3.75* 3.61* 3.62* 3.75* 4.06* 4.03* 3.75* 3.25*  CO2 21* 22 19* 21* 21* 22 23 17* 17* 18*  Cirrhosis of liver  LFTs as below.  Ammonia was slightly elevated.  Continues to have bowel movements Recent Labs  Lab 09/20/22 1429 09/21/22 0631 09/22/22 0203 09/22/22 1554 09/23/22 0304 09/23/22 1013  AST 20 22 22   --  23  --   ALT 15 14 16   --  17  --   ALKPHOS 153* 92 91  --  92  --   BILITOT 1.6* 2.1* 1.2  --  1.2  --   PROT 7.8 5.5* 5.9*  --  6.2*  --   ALBUMIN 3.1* 2.4* 2.5*  --  2.6*  --   AMMONIA   --   --  43*  --   --   --   INR 1.5*  --  1.6*  --  1.4*  --   PLT 250 211 161 172  --  162   Acute metabolic encephalopathy Gradually improving mental status.  Continue to monitor  Type 2 diabetes mellitus A1c 5 on 08/18/22 PTA meds-Ozempic weekly, Lantus 48 units daily. Continue previous regimen at home.    Hypertension H/o HTN, diastolic CHF Blood pressure low in 80s overnight. PTA meds-Coreg 6.25 mg twice daily, Imdur 30 mg daily, Lasix 60 mg a.m. 40 mg p.m., Aldactone 25 mg daily. Currently only on low-dose Coreg 3.125 mg twice daily.  Imdur, Lasix and Aldactone on hold. Blood pressure continues to remain in low normal range. Post discharge, patient will continue Coreg at previous dose.  I have advised patient and his wife to monitor blood pressure, pedal edema and shortness of breath at home and gradually resume other medicines as tolerated.  PAF (paroxysmal atrial fibrillation) Continue low-dose Coreg for rate control  Eliquis on hold. Last dose was on the morning of 9/4  CAD/CABG PAD, HLD Not on statin because of liver cirrhosis Eliquis plan as above Continue beta-blocker  Hypothyroidism Continue Synthroid  Gout On allopurinol   Mobility: Independent at home.   Goals of care   Code Status: Full Code   Wounds:  - Wound / Incision (Open or Dehisced) 03/18/21 Skin tear Arm Posterior;Right;Upper (Active)  Date First Assessed/Time First Assessed: 03/18/21 1940   Wound Type: Skin tear  Location: Arm  Location Orientation: Posterior;Right;Upper  Present on Admission: Yes    Assessments 03/18/2021  7:40 PM 03/19/2021  9:00 AM  Dressing Type Gauze (Comment) Gauze (Comment)  Dressing Status Old drainage Old drainage     No associated orders.    Discharge Exam:   Vitals:   09/23/22 0815 09/23/22 0847 09/23/22 0900 09/23/22 1000  BP:   (!) 143/62 (!) 128/46  Pulse:  66 67 67  Resp:  17 18 15   Temp: (!) 97.5 F (36.4 C)     TempSrc: Oral     SpO2:  99% 100% 100%   Weight:      Height:        Body mass index is 28.79 kg/m.   General exam: Pleasant, elderly Caucasian male.  Not in physical distress Skin: No rashes, lesions or ulcers. HEENT: Atraumatic, normocephalic, no obvious bleeding Lungs: Clear to auscultation bilaterally CVS: Regular rate and rhythm, no murmur GI/Abd soft, nontender, nondistended, bowel sound present CNS: Alert, awake, oriented to place and person only not to time, Psychiatry: Mood appropriate Extremities: No pedal edema, no calf tenderness  Follow ups:    Follow-up Information     Byrd Hesselbach, [place] Follow up.   Specialty: Physician Assistant Contact information: [address] [place] [address] [phone]  Discharge Instructions:   Discharge Instructions     Call MD for:  difficulty breathing, headache or visual disturbances   Complete by: As directed    Call MD for:  extreme fatigue   Complete by: As directed    Call MD for:  hives   Complete by: As directed    Call MD for:  persistant dizziness or light-headedness   Complete by: As directed    Call MD for:  persistant nausea and vomiting   Complete by: As directed    Call MD for:  severe uncontrolled pain   Complete by: As directed    Call MD for:  temperature >100.4   Complete by: As directed    Diet general   Complete by: As directed    Discharge instructions   Complete by: As directed    Recommendations at discharge:   Continue Protonix twice daily  Carafate before every meal plus nightly for 2 weeks and after that Carafate twice daily for 1 month.  Continue carvedilol at previous dose.  Continue to monitor blood pressure, pedal edema and shortness of breath at home and gradually resume other medicines as tolerated.  Follow-up with GI as an outpatient  General discharge instructions: Follow with Primary MD Byrd Hesselbach, PA in 7 days  Please request your PCP  to go over your hospital tests,  procedures, radiology results at the follow up. Please get your medicines reviewed and adjusted.  Your PCP may decide to repeat certain labs or tests as needed. Do not drive, operate heavy machinery, perform activities at heights, swimming or participation in water activities or provide baby sitting services if your were admitted for syncope or siezures until you have seen by Primary MD or a Neurologist and advised to do so again. [place] Controlled Substance Reporting System database was reviewed. Do not drive, operate heavy machinery, perform activities at heights, swim, participate in water activities or provide baby-sitting services while on medications for pain, sleep and mood until your outpatient physician has reevaluated you and advised to do so again.  You are strongly recommended to comply with the dose, frequency and duration of prescribed medications. Activity: As tolerated with Full fall precautions use walker/cane & assistance as needed Avoid using any recreational substances like cigarette, tobacco, alcohol, or non-prescribed drug. If you experience worsening of your admission symptoms, develop shortness of breath, life threatening emergency, suicidal or homicidal thoughts you must seek medical attention immediately by calling 911 or calling your MD immediately  if symptoms less severe. You must read complete instructions/literature along with all the possible adverse reactions/side effects for all the medicines you take and that have been prescribed to you. Take any new medicine only after you have completely understood and accepted all the possible adverse reactions/side effects.  Wear Seat belts while driving. You were cared for by a hospitalist during your hospital stay. If you have any questions about your discharge medications or the care you received while you were in the hospital after you are discharged, you can call the unit and ask to speak with the hospitalist or the  covering physician. Once you are discharged, your primary care physician will handle any further medical issues. Please note that NO REFILLS for any discharge medications will be authorized once you are discharged, as it is imperative that you return to your primary care physician (or establish a relationship with a primary care physician if you do not have one).   Increase activity slowly  Complete by: As directed        Discharge Medications:   Allergies as of 09/23/2022       Reactions   Lipitor [atorvastatin Calcium] Other (See Comments)   Weakness/ pain in legs        Medication List     STOP taking these medications    furosemide 20 MG tablet Commonly known as: LASIX   isosorbide mononitrate 30 MG 24 hr tablet Commonly known as: IMDUR   potassium chloride 10 MEQ tablet Commonly known as: KLOR-CON   spironolactone 25 MG tablet Commonly known as: ALDACTONE       TAKE these medications    acetaminophen 650 MG CR tablet Commonly known as: TYLENOL Take 100 mg by mouth daily.   allopurinol 100 MG tablet Commonly known as: ZYLOPRIM Take 100 mg by mouth daily.   carvedilol 12.5 MG tablet Commonly known as: COREG Take 6.25 mg by mouth 2 (two) times daily with a meal.   cholecalciferol 25 MCG (1000 UNIT) tablet Commonly known as: VITAMIN D3 Take 2,000 Units by mouth daily.   ciprofloxacin 250 MG tablet Commonly known as: CIPRO Take 1 tablet (250 mg total) by mouth 2 (two) times daily for 2 days.   cyanocobalamin 1000 MCG tablet Take 1,000 mcg by mouth daily.   ferrous gluconate 324 MG tablet Commonly known as: FERGON Take 1 tablet (324 mg total) by mouth daily with breakfast.   insulin glargine 100 UNIT/ML injection Commonly known as: LANTUS Inject 48 Units into the skin in the morning.   levothyroxine 25 MCG tablet Commonly known as: SYNTHROID Take 25 mcg by mouth daily before breakfast.   magnesium oxide 400 (240 Mg) MG tablet Commonly known  as: MAG-OX Take 2 tablets by mouth daily.   multivitamin capsule Take 1 capsule by mouth daily.   pantoprazole 40 MG tablet Commonly known as: PROTONIX Take 1 tablet (40 mg total) by mouth 2 (two) times daily.   Semaglutide (1 MG/DOSE) 2 MG/1.5ML Sopn Inject 1 mg into the skin every Sunday.   sucralfate 1 g tablet Commonly known as: CARAFATE Take 1 tablet (1 g total) by mouth 4 (four) times daily -  with meals and at bedtime. Take for 2 weeks and then take BID What changed: additional instructions   Systane Balance 0.6 % Soln Generic drug: Propylene Glycol Place 1 drop into both eyes daily as needed.   testosterone cypionate 200 MG/ML injection Commonly known as: DEPOTESTOSTERONE CYPIONATE Inject 200 mg into the muscle every 14 (fourteen) days. Given every other Sunday   Zinc 50 MG Tabs Take 1 tablet (50 mg total) by mouth daily.         The results of significant diagnostics from this hospitalization (including imaging, microbiology, ancillary and laboratory) are listed below for reference.    Procedures and Diagnostic Studies:   NM GI Blood Loss  Result Date: 09/21/2022 CLINICAL DATA:  Hematochezia beginning yesterday. EXAM: NUCLEAR MEDICINE GASTROINTESTINAL BLEEDING SCAN TECHNIQUE: Sequential abdominal images were obtained following intravenous administration of Tc-106m labeled red blood cells. Imaging was continued for 120 minutes. RADIOPHARMACEUTICALS:  20.4 mCi Tc-67m pertechnetate in-vitro labeled red cells. COMPARISON:  None Available. FINDINGS: Image degradation by motion noted throughout the exam. Physiologic distribution of radioactivity is seen within the abdomen and pelvis. No abnormal sites of radiopharmaceutical activity are seen within the GI tract during the 1st or 2nd hours. IMPRESSION: Negative.  No evidence of active GI bleeding. Electronically Signed   By: Dietrich Pates.D.  On: 09/21/2022 13:35     Labs:   Basic Metabolic Panel: Recent Labs  Lab  09/20/22 1429 09/21/22 0631 09/22/22 0203 09/23/22 0304  NA 138 140 137 134*  K 4.3 4.3 4.3 4.4  CL 102 110 111 107  CO2 23 17* 17* 18*  GLUCOSE 141* 142* 112* 118*  BUN 96* 91* 84* 85*  CREATININE 4.06* 4.03* 3.75* 3.25*  CALCIUM 8.8* 7.9* 7.8* 7.9*   GFR Estimated Creatinine Clearance: 20.6 mL/min (A) (by C-G formula based on SCr of 3.25 mg/dL (H)). Liver Function Tests: Recent Labs  Lab 09/20/22 1429 09/21/22 0631 09/22/22 0203 09/23/22 0304  AST 20 22 22 23   ALT 15 14 16 17   ALKPHOS 153* 92 91 92  BILITOT 1.6* 2.1* 1.2 1.2  PROT 7.8 5.5* 5.9* 6.2*  ALBUMIN 3.1* 2.4* 2.5* 2.6*   No results for input(s): "LIPASE", "AMYLASE" in the last 168 hours. Recent Labs  Lab 09/22/22 0203  AMMONIA 43*   Coagulation profile Recent Labs  Lab 09/20/22 1429 09/22/22 0203 09/23/22 0304  INR 1.5* 1.6* 1.4*    CBC: Recent Labs  Lab 09/20/22 1429 09/20/22 2111 09/21/22 0631 09/21/22 2008 09/22/22 0203 09/22/22 0744 09/22/22 1554 09/23/22 1013  WBC 9.7  --  16.3*  --  13.8*  --  15.0* 12.2*  NEUTROABS 7.4  --   --   --  10.5*  --   --   --   HGB 8.0*   < > 6.7* 8.0* 7.4* 7.2* 8.4* 8.3*  HCT 27.6*   < > 22.6* 25.6* 23.3* 23.0* 27.4* 27.1*  MCV 88.7  --  87.6  --  88.6  --  90.7 92.5  PLT 250  --  211  --  161  --  172 162   < > = values in this interval not displayed.   Cardiac Enzymes: No results for input(s): "CKTOTAL", "CKMB", "CKMBINDEX", "TROPONINI" in the last 168 hours. BNP: Invalid input(s): "POCBNP" CBG: Recent Labs  Lab 09/22/22 1134 09/22/22 1631 09/22/22 2208 09/23/22 0827 09/23/22 1124  GLUCAP 127* 206* 117* 132* 196*   D-Dimer No results for input(s): "DDIMER" in the last 72 hours. Hgb A1c No results for input(s): "HGBA1C" in the last 72 hours. Lipid Profile No results for input(s): "CHOL", "HDL", "LDLCALC", "TRIG", "CHOLHDL", "LDLDIRECT" in the last 72 hours. Thyroid function studies No results for input(s): "TSH", "T4TOTAL", "T3FREE",  "THYROIDAB" in the last 72 hours.  Invalid input(s): "FREET3" Anemia work up No results for input(s): "VITAMINB12", "FOLATE", "FERRITIN", "TIBC", "IRON", "RETICCTPCT" in the last 72 hours. Microbiology Recent Results (from the past 240 hour(s))  MRSA Next Gen by PCR, Nasal     Status: None   Collection Time: 09/20/22  8:36 PM   Specimen: Nasal Mucosa; Nasal Swab  Result Value Ref Range Status   MRSA by PCR Next Gen NOT DETECTED NOT DETECTED Final    Comment: (NOTE) The GeneXpert MRSA Assay (FDA approved for NASAL specimens only), is one component of a comprehensive MRSA colonization surveillance program. It is not intended to diagnose MRSA infection nor to guide or monitor treatment for MRSA infections. Test performance is not FDA approved in patients less than 18 years old. Performed at [facility], [address] [place], [place] [address]     Time coordinating discharge: 45 minutes  Signed: Melina Schools Coren Crownover  [facility] 09/23/2022, 11:42 AM

## 2022-09-23 NOTE — Progress Notes (Signed)
Gastroenterology Inpatient Follow-up Note   PATIENT IDENTIFICATION  Edward [name] is a 82 y.o. male Hospital Day: 4  SUBJECTIVE  The patient's chart has been reviewed. The patient's labs have been used.  Unfortunately no CBC was drawn with this morning's labs even though it was ordered. Patient seen this morning with his wife at bedside. The patient denies fevers or chills. No significant bowel movements overnight but is passing gas. He is upset that there is not a blood count because he wants to go home soon.   OBJECTIVE  Scheduled Inpatient Medications:   sodium chloride   Intravenous Once   carvedilol  3.125 mg Oral BID WC   Chlorhexidine Gluconate Cloth  6 each Topical Daily   insulin aspart  0-5 Units Subcutaneous QHS   insulin aspart  0-9 Units Subcutaneous TID WC   pantoprazole (PROTONIX) IV  40 mg Intravenous Q12H   polyethylene glycol  17 g Oral Daily   sucralfate  1 g Oral TID WC & HS   Continuous Inpatient Infusions:   cefTRIAXone (ROCEPHIN)  IV Stopped (09/21/22 2244)   PRN Inpatient Medications: acetaminophen **OR** acetaminophen, lip balm, morphine injection, ondansetron **OR** ondansetron (ZOFRAN) IV, polyvinyl alcohol   Physical Examination  Temp:  [97.3 F (36.3 C)-98.4 F (36.9 C)] 98.4 F (36.9 C) (09/08 0359) Pulse Rate:  [59-72] 62 (09/08 0700) Resp:  [9-28] 15 (09/08 0700) BP: (90-136)/(35-61) 123/53 (09/08 0700) SpO2:  [96 %-100 %] 98 % (09/08 0700) Temp (24hrs), Avg:97.9 F (36.6 C), Min:97.3 F (36.3 C), Max:98.4 F (36.9 C)  Weight: 91 kg GEN: Upset but in NAD and trying to get up to use the restroom and shower, appears chronically ill, nontoxic PSYCH: Cooperative, without pressured speech EYE: Conjunctivae pink, sclerae anicteric ENT: MMM CV: Nontachycardic RESP: No audible wheezing GI: NABS, soft, protuberant abdomen, nontender, without rebound  MSK/EXT: Trace bilateral pedal edema SKIN: No jaundice NEURO:  Alert & Oriented x 3,  no focal deficits, no evidence of asterixis   Review of Data   Laboratory Studies   Recent Labs  Lab 09/23/22 0304  NA 134*  K 4.4  CL 107  CO2 18*  BUN 85*  CREATININE 3.25*  GLUCOSE 118*  CALCIUM 7.9*   Recent Labs  Lab 09/23/22 0304  AST 23  ALT 17  ALKPHOS 92    Recent Labs  Lab 09/21/22 0631 09/21/22 2008 09/22/22 0203 09/22/22 0744 09/22/22 1554  WBC 16.3*  --  13.8*  --  15.0*  HGB 6.7*   < > 7.4*   < > 8.4*  HCT 22.6*   < > 23.3*   < > 27.4*  PLT 211  --  161  --  172   < > = values in this interval not displayed.   Recent Labs  Lab 09/20/22 1429 09/22/22 0203 09/23/22 0304  APTT 35  --   --   INR 1.5* 1.6* 1.4*   MELD 3.0: 24 at 09/23/2022  3:04 AM MELD-Na: 24 at 09/23/2022  3:04 AM Calculated from: Serum Creatinine: 3.25 mg/dL (Using max of 3 mg/dL) at 02/24/5619  3:08 AM Serum Sodium: 134 mmol/L at 09/23/2022  3:04 AM Total Bilirubin: 1.2 mg/dL at 06/19/7844  9:62 AM Serum Albumin: 2.6 g/dL at 09/20/2839  3:24 AM INR(ratio): 1.4 at 09/23/2022  3:04 AM Age at listing (hypothetical): 82 years Sex: Male at 09/23/2022  3:04 AM   Imaging Studies  NM GI Blood Loss  Result Date: 09/21/2022 CLINICAL DATA:  Hematochezia  beginning yesterday. EXAM: NUCLEAR MEDICINE GASTROINTESTINAL BLEEDING SCAN TECHNIQUE: Sequential abdominal images were obtained following intravenous administration of Tc-7m labeled red blood cells. Imaging was continued for 120 minutes. RADIOPHARMACEUTICALS:  20.4 mCi Tc-4m pertechnetate in-vitro labeled red cells. COMPARISON:  None Available. FINDINGS: Image degradation by motion noted throughout the exam. Physiologic distribution of radioactivity is seen within the abdomen and pelvis. No abnormal sites of radiopharmaceutical activity are seen within the GI tract during the 1st or 2nd hours. IMPRESSION: Negative.  No evidence of active GI bleeding. Electronically Signed   By: Danae Orleans M.D.   On: 09/21/2022 13:35    GI Procedures and Studies   EGD - No gross lesions in the proximal esophagus and in the mid esophagus. - Grade I esophageal varices found distally. - Z-line irregular, 40 cm from the incisors. - 2 cm hiatal hernia. - Non-bleeding gastric ulcers with a clean ulcer base (Forrest Class III) - from recent RFA still in process of healing. 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 Treated with argon plasma coagulation (APC) the remaining portion. - No gross lesions in the duodenal bulb, in the first portion of the duodenum and in the second portion of the duodenum.   ASSESSMENT  [name] is a 82 y.o. male with a pmh significant for CHF, CAD, atrial fibrillation (s/p MAZE and on Eliquis), CRI, hypertension, hyperlipidemia, COPD, bladder cancer, cirrhosis (complicated by portal hypertension manifested as ascites and esophageal varices), prior colon polyps, GAVE, diverticulosis.  Patient admitted with acute blood loss anemia with overall clinical hospitalization in setting most consistent with likely diverticular bleed and findings at time of endoscopy of gastric ulcers and persisting GAVE status post treatment.  Patient seems to be doing well today.  He has not had major bowel movements for the last 24 hours.  I do not know his hemoglobin since it has not been drawn as of yet.  Presumably this will be better.  This was not a variceal bleed.  I still suspect that this was a diverticular hemorrhage that stopped on its own.  His endoscopy yesterday did show that his stomach is still healing from the previous RFA session, and so there could have been some component of a chronic anemia still a result of the ulcer disease that is present, but overall presentation seems less likely to be ulcer related.  In any case anticoagulation needs to be held for at least 72 hours but I would say at least a week if possible to allow for further healing from  the ulcers in the stomach and to give more time from the presumed diverticular hemorrhage.  The patient and wife are adamant that they do not want to return to any anticoagulation, so I certainly recommend that the patient have further discussion with his cardiologist and his primary care doctor to decide how they want to proceed we will not proceed with reinitiation of anticoagulation.  If his blood counts look good, then I recommend that he get at least 1 dose of intravenous iron while in-house and he could potentially be discharged later today.  I have added a regular diet to him because it looks like he has done well and currently have no plans for colonoscopy.  If he needs to be monitored for 1 more day I do not think that is unreasonable either but we will see what his repeat blood count looks like and then can  make some decisions with our medicine colleagues.  All patient questions were answered to the best of my ability, and the patient agrees to the aforementioned plan of action with follow-up as indicated.   PLAN/RECOMMENDATIONS  Follow-up CBC today Recommend medicine consider strongly 1 dose of IV iron while in-house during this hospitalization to optimize his stores Will transition IV PPI to 40 mg twice daily oral Protonix Carafate before every meal plus nightly x 2 weeks - Carafate twice daily for 1 month thereafter Continue carvedilol if no contraindication from medicine standpoint Consider repeat endoscopy in 2 to 2-1/2 months with [facility] or primary [facility]enterologist now that his GAVE has been extensively treated with hope that he will not need further major therapies Hold anticoagulation for at least 72 more hours but preferably 1 week - Patient and wife are aware that continuing to hold anticoagulation will have risk of stroke and he and wife are adamant they do not want to restart (and accept this risk of stroke) so this will need further discussion as an outpatient with his primary  prescribing cardiologist Recommend antibiotics for at least a 5-day course to decrease risk of cirrhosis infectious risk - Will transition to ciprofloxacin twice daily for 2 more days Will initiate oral iron once daily   Please page/call with questions or concerns, but if patient is felt to be stable from a medicine standpoint for discharge then I will be okay with this too.   Corliss Parish, MD [facility] Office # [phone]    LOS: 2 days  Lemar Lofty  09/23/2022, 8:25 AM

## 2022-09-23 NOTE — Plan of Care (Signed)
  Problem: Metabolic: Goal: Ability to maintain appropriate glucose levels will improve Outcome: Progressing   

## 2022-09-23 NOTE — Evaluation (Signed)
Physical Therapy Evaluation Patient Details Name: Edward [name] MRN: [id number] DOB: 09-03-1940 Today's Date: 09/23/2022  History of Present Illness  Pt admitted from home 2* rectal bleed and with hx of CHF, CAD, CABG, PVD, pacemaker, and DM  Clinical Impression  Pt admitted as above and presenting with functional mobility limitations 2* mild generalized weakness, mild ambulatory balance deficits and decreased endurance.  Pt should progress to dc home with family assist.  Pt expressing disinterest in follow up PT at this time.      If plan is discharge home, recommend the following: A little help with walking and/or transfers;A little help with bathing/dressing/bathroom;Assistance with cooking/housework;Assist for transportation;Help with stairs or ramp for entrance   Can travel by private vehicle        Equipment Recommendations None recommended by PT  Recommendations for Other Services       Functional Status Assessment Patient has had a recent decline in their functional status and demonstrates the ability to make significant improvements in function in a reasonable and predictable amount of time.     Precautions / Restrictions Precautions Precautions: Fall Restrictions Weight Bearing Restrictions: No      Mobility  Bed Mobility               General bed mobility comments: Pt up in chair and requests back to same    Transfers Overall transfer level: Needs assistance Equipment used: Rolling walker (2 wheels) Transfers: Sit to/from Stand Sit to Stand: Contact guard assist, Supervision           General transfer comment: cue for use of UEs to self assist pushing from chair    Ambulation/Gait Ambulation/Gait assistance: Contact guard assist Gait Distance (Feet): 140 Feet Assistive device: Rolling walker (2 wheels) Gait Pattern/deviations: Step-to pattern, Step-through pattern, Decreased step length - right, Decreased step length - left, Shuffle, Trunk  flexed Gait velocity: decr     General Gait Details: min cues for posture and position from [employer]            Wheelchair Mobility     Tilt Bed    Modified Rankin (Stroke Patients Only)       Balance Overall balance assessment: Needs assistance Sitting-balance support: Feet supported, No upper extremity supported Sitting balance-Leahy Scale: Good     Standing balance support: No upper extremity supported Standing balance-Leahy Scale: Fair                               Pertinent Vitals/Pain Pain Assessment Pain Assessment: No/denies pain    Home Living Family/patient expects to be discharged to:: Private residence Living Arrangements: Spouse/significant other Available Help at Discharge: Family;Available 24 hours/day Type of Home: House Home Access: Stairs to enter Entrance Stairs-Rails: Can reach both;Left;Right Entrance Stairs-Number of Steps: 5   Home Layout: Two level;Other (Comment) Home Equipment: Rolling Walker (2 wheels);Grab bars - tub/shower;Wheelchair - manual;Lift chair      Prior Function Prior Level of Function : Independent/Modified Independent             Mobility Comments: uses walker when needed ADLs Comments: independent     Extremity/Trunk Assessment   Upper Extremity Assessment Upper Extremity Assessment: Generalized weakness    Lower Extremity Assessment Lower Extremity Assessment: Generalized weakness    Cervical / Trunk Assessment Cervical / Trunk Assessment: Normal  Communication   Communication Communication: Hearing impairment Cueing Techniques: Verbal cues;Gestural cues  Cognition Arousal: Alert  Behavior During Therapy: Flat affect, WFL for tasks assessed/performed Overall Cognitive Status: Within Functional Limits for tasks assessed                                          General Comments      Exercises     Assessment/Plan    PT Assessment Patient needs continued PT  services  PT Problem List Decreased strength;Decreased activity tolerance;Decreased balance;Decreased mobility       PT Treatment Interventions DME instruction;Gait training;Stair training;Therapeutic exercise;Therapeutic activities;Functional mobility training;Patient/family education    PT Goals (Current goals can be found in the Care Plan section)  Acute Rehab PT Goals Patient Stated Goal: HOME PT Goal Formulation: With patient Time For Goal Achievement: 09/30/22 Potential to Achieve Goals: Good    Frequency Min 1X/week     Co-evaluation               AM-PAC PT "6 Clicks" Mobility  Outcome Measure Help needed turning from your back to your side while in a flat bed without using bedrails?: None Help needed moving from lying on your back to sitting on the side of a flat bed without using bedrails?: A Little Help needed moving to and from a bed to a chair (including a wheelchair)?: A Little Help needed standing up from a chair using your arms (e.g., wheelchair or bedside chair)?: A Little Help needed to walk in hospital room?: A Little Help needed climbing 3-5 steps with a railing? : A Little 6 Click Score: 19    End of Session Equipment Utilized During Treatment: Gait belt Activity Tolerance: Patient tolerated treatment well Patient left: in chair;with call bell/phone within reach;with chair alarm set;with family/visitor present Nurse Communication: Mobility status PT Visit Diagnosis: Unsteadiness on feet (R26.81);Difficulty in walking, not elsewhere classified (R26.2)    Time: 1610-9604 PT Time Calculation (min) (ACUTE ONLY): 17 min   Charges:   PT Evaluation $PT Eval Low Complexity: 1 Low   PT General Charges $$ ACUTE PT VISIT: 1 Visit         Edward [name] PT Acute Rehabilitation Services Pager [phone] Office [phone]   Edward [name] 09/23/2022, 1:14 PM

## 2022-09-24 ENCOUNTER — Encounter (HOSPITAL_COMMUNITY): Payer: Self-pay | Admitting: Gastroenterology

## 2022-09-24 LAB — TYPE AND SCREEN
ABO/RH(D): O NEG
Antibody Screen: NEGATIVE
Unit division: 0
Unit division: 0
Unit division: 0
Unit division: 0
Unit division: 0

## 2022-09-24 LAB — BPAM RBC
Blood Product Expiration Date: 202409182359
Blood Product Expiration Date: 202410022359
Blood Product Expiration Date: 202410032359
Blood Product Expiration Date: 202410032359
Blood Product Expiration Date: 202410042359
ISSUE DATE / TIME: 202409052150
ISSUE DATE / TIME: 202409060031
ISSUE DATE / TIME: 202409060751
ISSUE DATE / TIME: 202409061544
ISSUE DATE / TIME: 202409071023
Unit Type and Rh: 9500
Unit Type and Rh: 9500
Unit Type and Rh: 9500
Unit Type and Rh: 9500
Unit Type and Rh: 9500

## 2022-09-26 ENCOUNTER — Telehealth: Payer: Self-pay | Admitting: Gastroenterology

## 2022-09-26 NOTE — Telephone Encounter (Signed)
PT wife is calling concerned that he is having issues with eating. Please advise.

## 2022-09-26 NOTE — Telephone Encounter (Signed)
Bonita Quin I think this is a Dr Rhea Belton pt from several years ago. Dr Meridee Score only did a procedure when the pt was in the hospital.

## 2022-09-27 NOTE — Telephone Encounter (Signed)
Patient after the most recent round of therapy (APC not RFA) for GAVE, did not have any abdominal pain post-procedure. I'm not sure, but he certainly has significant ulcer disease. Continue high dose PPI. Continue Carafate. I think he needs to be on Tylenol 500 mg Q6H. No NSAIDs. If pain is intolerable, then non-contrast CTAP will need to be pursued due to his kidney function. Further workup as per Dr. Rhea Belton if he continues to have issues and workup/management above is not helping. GM

## 2022-09-27 NOTE — Telephone Encounter (Signed)
Left message for pt to call back.  Pts wife called and states that the pt is still feeling pressure in his stomach and chest from the ablation. She wants to know if this is still  normal to feel this pressure and about how long this may last. Dr. Meridee Score please advise. Wife requests we message them via mychart.

## 2022-10-26 ENCOUNTER — Inpatient Hospital Stay (HOSPITAL_COMMUNITY)
Admission: EM | Admit: 2022-10-26 | Discharge: 2022-10-28 | DRG: 291 | Disposition: A | Discharge status: Home / Self Care | Payer: No Typology Code available for payment source | Attending: Family Medicine | Admitting: Family Medicine

## 2022-10-26 ENCOUNTER — Emergency Department (HOSPITAL_COMMUNITY): Payer: No Typology Code available for payment source

## 2022-10-26 ENCOUNTER — Other Ambulatory Visit: Payer: Self-pay

## 2022-10-26 ENCOUNTER — Encounter (HOSPITAL_COMMUNITY): Payer: Self-pay

## 2022-10-26 DIAGNOSIS — M109 Gout, unspecified: Secondary | ICD-10-CM | POA: Diagnosis present

## 2022-10-26 DIAGNOSIS — E1122 Type 2 diabetes mellitus with diabetic chronic kidney disease: Secondary | ICD-10-CM | POA: Diagnosis present

## 2022-10-26 DIAGNOSIS — N184 Chronic kidney disease, stage 4 (severe): Secondary | ICD-10-CM | POA: Diagnosis present

## 2022-10-26 DIAGNOSIS — Z794 Long term (current) use of insulin: Secondary | ICD-10-CM | POA: Diagnosis not present

## 2022-10-26 DIAGNOSIS — E1142 Type 2 diabetes mellitus with diabetic polyneuropathy: Secondary | ICD-10-CM | POA: Diagnosis present

## 2022-10-26 DIAGNOSIS — I2510 Atherosclerotic heart disease of native coronary artery without angina pectoris: Secondary | ICD-10-CM | POA: Diagnosis present

## 2022-10-26 DIAGNOSIS — E1151 Type 2 diabetes mellitus with diabetic peripheral angiopathy without gangrene: Secondary | ICD-10-CM | POA: Diagnosis present

## 2022-10-26 DIAGNOSIS — F17210 Nicotine dependence, cigarettes, uncomplicated: Secondary | ICD-10-CM | POA: Diagnosis present

## 2022-10-26 DIAGNOSIS — I10 Essential (primary) hypertension: Secondary | ICD-10-CM | POA: Diagnosis present

## 2022-10-26 DIAGNOSIS — E785 Hyperlipidemia, unspecified: Secondary | ICD-10-CM | POA: Diagnosis present

## 2022-10-26 DIAGNOSIS — Z6834 Body mass index (BMI) 34.0-34.9, adult: Secondary | ICD-10-CM

## 2022-10-26 DIAGNOSIS — R188 Other ascites: Secondary | ICD-10-CM | POA: Diagnosis present

## 2022-10-26 DIAGNOSIS — I480 Paroxysmal atrial fibrillation: Secondary | ICD-10-CM | POA: Diagnosis present

## 2022-10-26 DIAGNOSIS — K7460 Unspecified cirrhosis of liver: Secondary | ICD-10-CM | POA: Diagnosis present

## 2022-10-26 DIAGNOSIS — Z950 Presence of cardiac pacemaker: Secondary | ICD-10-CM

## 2022-10-26 DIAGNOSIS — W228XXA Striking against or struck by other objects, initial encounter: Secondary | ICD-10-CM | POA: Diagnosis present

## 2022-10-26 DIAGNOSIS — S5002XA Contusion of left elbow, initial encounter: Secondary | ICD-10-CM | POA: Diagnosis present

## 2022-10-26 DIAGNOSIS — I5033 Acute on chronic diastolic (congestive) heart failure: Secondary | ICD-10-CM | POA: Diagnosis present

## 2022-10-26 DIAGNOSIS — Z79890 Hormone replacement therapy (postmenopausal): Secondary | ICD-10-CM | POA: Diagnosis not present

## 2022-10-26 DIAGNOSIS — R601 Generalized edema: Secondary | ICD-10-CM | POA: Diagnosis present

## 2022-10-26 DIAGNOSIS — Z888 Allergy status to other drugs, medicaments and biological substances status: Secondary | ICD-10-CM

## 2022-10-26 DIAGNOSIS — Z8249 Family history of ischemic heart disease and other diseases of the circulatory system: Secondary | ICD-10-CM

## 2022-10-26 DIAGNOSIS — Z96652 Presence of left artificial knee joint: Secondary | ICD-10-CM | POA: Diagnosis present

## 2022-10-26 DIAGNOSIS — E039 Hypothyroidism, unspecified: Secondary | ICD-10-CM | POA: Diagnosis present

## 2022-10-26 DIAGNOSIS — E669 Obesity, unspecified: Secondary | ICD-10-CM | POA: Diagnosis present

## 2022-10-26 DIAGNOSIS — I130 Hypertensive heart and chronic kidney disease with heart failure and stage 1 through stage 4 chronic kidney disease, or unspecified chronic kidney disease: Principal | ICD-10-CM | POA: Diagnosis present

## 2022-10-26 DIAGNOSIS — Z951 Presence of aortocoronary bypass graft: Secondary | ICD-10-CM | POA: Diagnosis not present

## 2022-10-26 DIAGNOSIS — Z79899 Other long term (current) drug therapy: Secondary | ICD-10-CM

## 2022-10-26 DIAGNOSIS — E119 Type 2 diabetes mellitus without complications: Secondary | ICD-10-CM

## 2022-10-26 DIAGNOSIS — Z9049 Acquired absence of other specified parts of digestive tract: Secondary | ICD-10-CM | POA: Diagnosis not present

## 2022-10-26 DIAGNOSIS — Z8673 Personal history of transient ischemic attack (TIA), and cerebral infarction without residual deficits: Secondary | ICD-10-CM

## 2022-10-26 DIAGNOSIS — Y92511 Restaurant or cafe as the place of occurrence of the external cause: Secondary | ICD-10-CM | POA: Diagnosis not present

## 2022-10-26 DIAGNOSIS — Z800 Family history of malignant neoplasm of digestive organs: Secondary | ICD-10-CM

## 2022-10-26 DIAGNOSIS — I5043 Acute on chronic combined systolic (congestive) and diastolic (congestive) heart failure: Secondary | ICD-10-CM | POA: Diagnosis not present

## 2022-10-26 DIAGNOSIS — D638 Anemia in other chronic diseases classified elsewhere: Secondary | ICD-10-CM | POA: Diagnosis not present

## 2022-10-26 DIAGNOSIS — D631 Anemia in chronic kidney disease: Secondary | ICD-10-CM | POA: Diagnosis present

## 2022-10-26 DIAGNOSIS — K7581 Nonalcoholic steatohepatitis (NASH): Secondary | ICD-10-CM | POA: Diagnosis present

## 2022-10-26 DIAGNOSIS — D539 Nutritional anemia, unspecified: Secondary | ICD-10-CM | POA: Diagnosis present

## 2022-10-26 LAB — CBC WITH DIFFERENTIAL/PLATELET
Abs Immature Granulocytes: 0.04 10*3/uL (ref 0.00–0.07)
Basophils Absolute: 0.1 10*3/uL (ref 0.0–0.1)
Basophils Relative: 1 %
Eosinophils Absolute: 0.4 10*3/uL (ref 0.0–0.5)
Eosinophils Relative: 5 %
HCT: 33.2 % — ABNORMAL LOW (ref 39.0–52.0)
Hemoglobin: 9.7 g/dL — ABNORMAL LOW (ref 13.0–17.0)
Immature Granulocytes: 1 %
Lymphocytes Relative: 10 %
Lymphs Abs: 0.7 10*3/uL (ref 0.7–4.0)
MCH: 27.2 pg (ref 26.0–34.0)
MCHC: 29.2 g/dL — ABNORMAL LOW (ref 30.0–36.0)
MCV: 93.3 fL (ref 80.0–100.0)
Monocytes Absolute: 1.1 10*3/uL — ABNORMAL HIGH (ref 0.1–1.0)
Monocytes Relative: 14 %
Neutro Abs: 5.1 10*3/uL (ref 1.7–7.7)
Neutrophils Relative %: 69 %
Platelets: 210 10*3/uL (ref 150–400)
RBC: 3.56 MIL/uL — ABNORMAL LOW (ref 4.22–5.81)
RDW: 18.2 % — ABNORMAL HIGH (ref 11.5–15.5)
WBC: 7.3 10*3/uL (ref 4.0–10.5)
nRBC: 0 % (ref 0.0–0.2)

## 2022-10-26 LAB — COMPREHENSIVE METABOLIC PANEL
ALT: 12 U/L (ref 0–44)
AST: 25 U/L (ref 15–41)
Albumin: 3 g/dL — ABNORMAL LOW (ref 3.5–5.0)
Alkaline Phosphatase: 151 U/L — ABNORMAL HIGH (ref 38–126)
Anion gap: 11 (ref 5–15)
BUN: 45 mg/dL — ABNORMAL HIGH (ref 8–23)
CO2: 23 mmol/L (ref 22–32)
Calcium: 8 mg/dL — ABNORMAL LOW (ref 8.9–10.3)
Chloride: 100 mmol/L (ref 98–111)
Creatinine, Ser: 2.79 mg/dL — ABNORMAL HIGH (ref 0.61–1.24)
GFR, Estimated: 22 mL/min — ABNORMAL LOW (ref >60)
Glucose, Bld: 136 mg/dL — ABNORMAL HIGH (ref 70–99)
Potassium: 3.8 mmol/L (ref 3.5–5.1)
Sodium: 134 mmol/L — ABNORMAL LOW (ref 135–145)
Total Bilirubin: 1 mg/dL (ref 0.3–1.2)
Total Protein: 7 g/dL (ref 6.5–8.1)

## 2022-10-26 LAB — PROTIME-INR
INR: 1.4 — ABNORMAL HIGH (ref 0.8–1.2)
Prothrombin Time: 17.2 s — ABNORMAL HIGH (ref 11.4–15.2)

## 2022-10-26 LAB — CBG MONITORING, ED
Glucose-Capillary: 101 mg/dL — ABNORMAL HIGH (ref 70–99)
Glucose-Capillary: 113 mg/dL — ABNORMAL HIGH (ref 70–99)

## 2022-10-26 LAB — LIPASE, BLOOD: Lipase: 45 U/L (ref 11–51)

## 2022-10-26 LAB — BRAIN NATRIURETIC PEPTIDE: B Natriuretic Peptide: 300.1 pg/mL — ABNORMAL HIGH (ref 0.0–100.0)

## 2022-10-26 MED ORDER — FUROSEMIDE 10 MG/ML IJ SOLN
80.0000 mg | Freq: Once | INTRAMUSCULAR | Status: AC
  Administered 2022-10-26: 80 mg via INTRAVENOUS
  Filled 2022-10-26: qty 8

## 2022-10-26 MED ORDER — ONDANSETRON HCL 4 MG PO TABS: 4.0000 mg | ORAL_TABLET | Freq: Four times a day (QID) | ORAL | Status: DC | PRN

## 2022-10-26 MED ORDER — INSULIN GLARGINE-YFGN 100 UNIT/ML ~~LOC~~ SOLN
35.0000 [IU] | Freq: Every day | SUBCUTANEOUS | Status: DC
Start: 2022-10-27 — End: 2022-10-26

## 2022-10-26 MED ORDER — ACETAMINOPHEN 325 MG PO TABS: 650.0000 mg | ORAL_TABLET | Freq: Four times a day (QID) | ORAL | Status: DC | PRN

## 2022-10-26 MED ORDER — CARVEDILOL 6.25 MG PO TABS
6.2500 mg | ORAL_TABLET | Freq: Two times a day (BID) | ORAL | Status: DC
  Administered 2022-10-26 – 2022-10-28 (×4): 6.25 mg via ORAL
  Filled 2022-10-26: qty 1
  Filled 2022-10-26 (×2): qty 2
  Filled 2022-10-26: qty 1

## 2022-10-26 MED ORDER — TRAZODONE HCL 50 MG PO TABS: 25.0000 mg | ORAL_TABLET | Freq: Every evening | ORAL | Status: DC | PRN

## 2022-10-26 MED ORDER — ACETAMINOPHEN 650 MG RE SUPP: 650.0000 mg | Freq: Four times a day (QID) | RECTAL | Status: DC | PRN

## 2022-10-26 MED ORDER — INSULIN ASPART 100 UNIT/ML IJ SOLN
0.0000 [IU] | Freq: Three times a day (TID) | INTRAMUSCULAR | Status: DC
  Filled 2022-10-26: qty 0.15

## 2022-10-26 MED ORDER — ONDANSETRON HCL 4 MG/2ML IJ SOLN: 4.0000 mg | Freq: Four times a day (QID) | INTRAMUSCULAR | Status: DC | PRN

## 2022-10-26 MED ORDER — INSULIN GLARGINE-YFGN 100 UNIT/ML ~~LOC~~ SOLN
35.0000 [IU] | Freq: Every day | SUBCUTANEOUS | Status: DC
  Administered 2022-10-26 – 2022-10-28 (×3): 35 [IU] via SUBCUTANEOUS
  Filled 2022-10-26 (×3): qty 0.35

## 2022-10-26 MED ORDER — FUROSEMIDE 10 MG/ML IJ SOLN
80.0000 mg | Freq: Two times a day (BID) | INTRAMUSCULAR | Status: DC
  Administered 2022-10-26 – 2022-10-28 (×4): 80 mg via INTRAVENOUS
  Filled 2022-10-26 (×4): qty 8

## 2022-10-26 MED ORDER — LEVOTHYROXINE SODIUM 25 MCG PO TABS
25.0000 ug | ORAL_TABLET | Freq: Every day | ORAL | Status: DC
  Administered 2022-10-27 – 2022-10-28 (×2): 25 ug via ORAL
  Filled 2022-10-26 (×2): qty 1

## 2022-10-26 MED ORDER — FERROUS GLUCONATE 324 (38 FE) MG PO TABS
324.0000 mg | ORAL_TABLET | Freq: Every day | ORAL | Status: DC
  Administered 2022-10-27 – 2022-10-28 (×2): 324 mg via ORAL
  Filled 2022-10-26 (×3): qty 1

## 2022-10-26 MED ORDER — SUCRALFATE 1 G PO TABS
1.0000 g | ORAL_TABLET | Freq: Three times a day (TID) | ORAL | Status: DC
  Administered 2022-10-26 – 2022-10-28 (×7): 1 g via ORAL
  Filled 2022-10-26 (×7): qty 1

## 2022-10-26 MED ORDER — INSULIN ASPART 100 UNIT/ML IJ SOLN
0.0000 [IU] | Freq: Every day | INTRAMUSCULAR | Status: DC
  Filled 2022-10-26: qty 0.05

## 2022-10-26 NOTE — ED Provider Notes (Signed)
Emergency Department Provider Note   I have reviewed the triage vital signs and the nursing notes.   HISTORY  Chief Complaint Bloated   HPI Edward [name] is a 82 y.o. male past history of CKD, congestive heart failure, cirrhosis with ascites presents to the emergency department with increased swelling in his arms/legs/abdomen.  Patient is followed primarily at the [place].  His wife and patient have been in conjunction with that team, increasing Lasix, trying to diurese at home without success.  Patient reports some mild dyspnea no chest pain.  He has significant swelling to his arms, legs, face, abdomen, scrotum.  He takes 80 mg of Lasix in the morning and 60 mg at night.  This was increased to 80 twice daily without improvement and he was advised by his PCP to present to the emergency department for admission/diuresis.   Past Medical History:  Diagnosis Date   Acute on chronic diastolic heart failure (HCC) 03/20/2022   Anemia    ?   Carotid artery occlusion    Chronic diastolic CHF (congestive heart failure) (HCC) 04/23/2012   CKD (chronic kidney disease), stage III (HCC)    Claudication (HCC)    Coronary artery disease    s/p CABG 2013   Diabetic neuropathy (HCC)    mostly feet/ legs   Diverticulosis    Dyslipidemia    Exertional shortness of breath    Fracture of one rib, left side, initial encounter for closed fracture 02/01/2021   Gastritis and gastroduodenitis    GERD (gastroesophageal reflux disease)    GI bleed    while on triple anticoag therapy   HTN (hypertension)    Pacemaker    PAF (paroxysmal atrial fibrillation) (HCC)    s/p MAZE at time of CABG   PVD (peripheral vascular disease) (HCC)    R CEA 2011, prior PTA, left fem-pop 2014   Stroke (HCC)    vs TIA   Tubular adenoma of colon    Type II diabetes mellitus (HCC)     Review of Systems  Constitutional: No fever/chills. Positive fluid retention diffusely.  Cardiovascular: Denies chest  pain. Respiratory: Positive shortness of breath. Gastrointestinal: No abdominal pain.  No nausea, no vomiting.  No diarrhea.  No constipation. Positive distension.  Skin: Negative for rash. Neurological: Negative for headaches.  ____________________________________________   PHYSICAL EXAM:  VITAL SIGNS: ED Triage Vitals  Encounter Vitals Group     BP 10/26/22 0939 130/67     Pulse Rate 10/26/22 0939 65     Resp 10/26/22 0939 18     Temp 10/26/22 0939 97.7 F (36.5 C)     Temp Source 10/26/22 0939 Oral     SpO2 10/26/22 0939 97 %     Weight 10/26/22 0939 238 lb (108 kg)     Height 10/26/22 0939 5\' 10"  (1.778 m)   Constitutional: Alert and oriented. Well appearing and in no acute distress. Eyes: Conjunctivae are normal.  Head: Atraumatic. Nose: No congestion/rhinnorhea. Mouth/Throat: Mucous membranes are moist.  Neck: No stridor.  Cardiovascular: Normal rate, regular rhythm. Good peripheral circulation. Grossly normal heart sounds.   Respiratory: Normal respiratory effort.  No retractions. Lungs CTAB. Gastrointestinal: Soft and nontender. Positive distention.  Musculoskeletal: Patient with diffuse pitting edema which is most extensive in the legs but extending to the scrotum, bilateral arms, face.  Neurologic:  Normal speech and language.  Skin:  Skin is warm, dry and intact. No rash noted.  ____________________________________________   LABS (all labs ordered  are listed, but only abnormal results are displayed)  Labs Reviewed  CBC WITH DIFFERENTIAL/PLATELET - Abnormal; Notable for the following components:      Result Value   RBC 3.56 (*)    Hemoglobin 9.7 (*)    HCT 33.2 (*)    MCHC 29.2 (*)    RDW 18.2 (*)    Monocytes Absolute 1.1 (*)    All other components within normal limits  COMPREHENSIVE METABOLIC PANEL - Abnormal; Notable for the following components:   Sodium 134 (*)    Glucose, Bld 136 (*)    BUN 45 (*)    Creatinine, Ser 2.79 (*)    Calcium 8.0 (*)     Albumin 3.0 (*)    Alkaline Phosphatase 151 (*)    GFR, Estimated 22 (*)    All other components within normal limits  PROTIME-INR - Abnormal; Notable for the following components:   Prothrombin Time 17.2 (*)    INR 1.4 (*)    All other components within normal limits  BRAIN NATRIURETIC PEPTIDE - Abnormal; Notable for the following components:   B Natriuretic Peptide 300.1 (*)    All other components within normal limits  LIPASE, BLOOD   ____________________________________________  RADIOLOGY  DG Chest Portable 1 View  Result Date: 10/26/2022 CLINICAL DATA:  Shortness of breath.  History of cirrhosis. EXAM: PORTABLE CHEST 1 VIEW COMPARISON:  08/18/2022. FINDINGS: There are diffuse increased interstitial markings when compared to the prior exam. Bilateral lung fields are otherwise clear. No acute consolidation or lung collapse. Bilateral costophrenic angles are clear. Stable cardio-mediastinal silhouette. There are surgical staples along the heart border and sternotomy wires, status post CABG (coronary artery bypass graft). Left atrial appendage closure device again seen. There is a left sided 3-lead pacemaker. No acute osseous abnormalities. The soft tissues are within normal limits. IMPRESSION: 1. Diffuse increased interstitial markings when compared to the prior exam, most in keeping with mild pulmonary edema. 2. Otherwise no acute cardiopulmonary abnormalities. Electronically Signed   By: Jules Schick M.D.   On: 10/26/2022 13:23    ____________________________________________   PROCEDURES  Procedure(s) performed:   Procedures  CRITICAL CARE Performed by: Maia Plan Total critical care time: 35 minutes Critical care time was exclusive of separately billable procedures and treating other patients. Critical care was necessary to treat or prevent imminent or life-threatening deterioration. Critical care was time spent personally by me on the following activities:  development of treatment plan with patient and/or surrogate as well as nursing, discussions with consultants, evaluation of patient's response to treatment, examination of patient, obtaining history from patient or surrogate, ordering and performing treatments and interventions, ordering and review of laboratory studies, ordering and review of radiographic studies, pulse oximetry and re-evaluation of patient's condition.  Alona Bene, MD Emergency Medicine  ____________________________________________   INITIAL IMPRESSION / ASSESSMENT AND PLAN / ED COURSE  Pertinent labs & imaging results that were available during my care of the patient were reviewed by me and considered in my medical decision making (see chart for details).   This patient is Presenting for Evaluation of fluid retention, which does require a range of treatment options, and is a complaint that involves a high risk of morbidity and mortality.  The Differential Diagnoses include CHF exacerbation, anasarca, pulmonary edema, large volume ascites, etc.  Critical Interventions-    Medications  carvedilol (COREG) tablet 6.25 mg (has no administration in time range)  levothyroxine (SYNTHROID) tablet 25 mcg (has no administration in time range)  sucralfate (CARAFATE) tablet 1 g (has no administration in time range)  ferrous gluconate (FERGON) tablet 324 mg (has no administration in time range)  insulin aspart (novoLOG) injection 0-15 Units (has no administration in time range)  insulin aspart (novoLOG) injection 0-5 Units (has no administration in time range)  acetaminophen (TYLENOL) tablet 650 mg (has no administration in time range)    Or  acetaminophen (TYLENOL) suppository 650 mg (has no administration in time range)  traZODone (DESYREL) tablet 25 mg (has no administration in time range)  ondansetron (ZOFRAN) tablet 4 mg (has no administration in time range)    Or  ondansetron (ZOFRAN) injection 4 mg (has no administration  in time range)  furosemide (LASIX) injection 80 mg (has no administration in time range)  insulin glargine-yfgn (SEMGLEE) injection 35 Units (has no administration in time range)  furosemide (LASIX) injection 80 mg (80 mg Intravenous Given 10/26/22 1248)    Reassessment after intervention:  symptoms improved   I did obtain Additional Historical Information from wife at bedside.   I decided to review pertinent External Data, and in summary last ECHO from 03/21/22 shows mildly reduced systolic function.   Clinical Laboratory Tests Ordered, included creatinine of 2.79 similar to prior.  No electrolyte disturbance.  Mild anemia at 9.7.  Radiologic Tests Ordered, included CXR. I independently interpreted the images and agree with radiology interpretation.   Cardiac Monitor Tracing which shows NSR.    Social Determinants of Health Risk patient is a smoker.   Consult complete with [facility]. Plan for admit for diuresis.   Medical Decision Making: Summary:  Presents to the emergency department for evaluation of mild shortness of breath with diffuse fluid retention.  He does have a distended abdomen which is nontender.  No evidence to suspect SBP.  Additionally, patient has diffuse pitting edema in his extremities and groin area.  Overall presentation consistent with anasarca.  Has had a trial of outpatient diuresis without success.  Will likely need inpatient diuretic and consideration of LVP.   Reevaluation with update and discussion with patient and wife. Plan for admit. They are in agreement.   Patient's presentation is most consistent with acute presentation with potential threat to life or bodily function.   Disposition: admit  ____________________________________________  FINAL CLINICAL IMPRESSION(S) / ED DIAGNOSES  Final diagnoses:  Anasarca  Other ascites     Note:  This document was prepared using Dragon voice recognition software and may include unintentional dictation  errors.  Alona Bene, MD, [facility] Emergency Medicine    Talissa Apple, Arlyss Repress, MD 10/26/22 1540

## 2022-10-26 NOTE — ED Notes (Signed)
Pt transferred onto hospital bed.

## 2022-10-26 NOTE — ED Provider Triage Note (Signed)
Emergency Medicine Provider Triage Evaluation Note  Edward [name] , a 82 y.o. male  was evaluated in triage.  Pt complains of abdominal swelling. Pt requesting fluid removal  Review of Systems  Positive: swelling Negative: fever  Physical Exam  BP 130/67 (BP Location: Right Arm)   Pulse 65   Temp 97.7 F (36.5 C) (Oral)   Resp 18   Ht 5\' 10"  (1.778 m)   Wt 108 kg   SpO2 97%   BMI 34.15 kg/m  Gen:   Awake, no distress   Resp:  Normal effort  MSK:   Moves extremities without difficulty  Other:  Abdomen distended   Medical Decision Making  Medically screening exam initiated at 10:06 AM.  Appropriate orders placed.  Edward [name] was informed that the remainder of the evaluation will be completed by another provider, this initial triage assessment does not replace that evaluation, and the importance of remaining in the ED until their evaluation is complete.     Elson Areas, [place] 10/26/22 1007

## 2022-10-26 NOTE — H&P (Signed)
History and Physical  Edward [name] AVW:[id number] DOB: 04-23-1940 DOA: 10/26/2022  PCP: [facility]   Chief Complaint: Fluid buildup  HPI: Edward [name] is a 82 y.o. 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  Patient and his wife provide the history, states that he was diuresed when he was last hospitalized 9/5 to 9/8 at [facility] for GI bleeding.  He had EGD during his hospital stay, which showed healing ulcers from prior RFA and overall improving GAVE.  They deny any further evidence of rectal bleeding, but they did not resume Eliquis.  In any case, patient states that he weighed about 193 pounds when he left the hospital since he was also diuresed.  For reasons that are unclear to me, he felt this was too low overweight, he wants to be closer to 200 pounds.  Wife states he therefore started eating more, but has gradually continued to gain weight, currently weighs over 220 pounds.  He denies any abdominal pain, cough, shortness of breath, fevers, chills, chest pain.  She says that he has significant swelling in his bilateral legs, into his scrotum.  Wife also agrees that his bilateral arms are swollen.  Over the last week or so, his [place] provider has been increasing his Lasix, up to 80 mg p.o. twice daily.  They say he has been urinating more with increasing doses of Lasix, but has continued to put on weight.  ED Course: On evaluation in the emergency department, his vital signs are normal.  Lab work was pursued, reveals improved CKD, elevated alk phos, BNP 300, normal lactate.  CBC demonstrates resolved leukocytosis, and stable hemoglobin of 9.7.  Review of  Systems: Please see HPI for pertinent positives and negatives. A complete 10 system review of systems are otherwise negative.  Past Medical History:  Diagnosis Date   Acute on chronic diastolic heart failure (HCC) 03/20/2022   Anemia    ?   Carotid artery occlusion    Chronic diastolic CHF (congestive heart failure) (HCC) 04/23/2012   CKD (chronic kidney disease), stage III (HCC)    Claudication (HCC)    Coronary artery disease    s/p CABG 2013   Diabetic neuropathy (HCC)    mostly feet/ legs   Diverticulosis    Dyslipidemia    Exertional shortness of breath    Fracture of one rib, left side, initial encounter for closed fracture 02/01/2021   Gastritis and gastroduodenitis    GERD (gastroesophageal reflux disease)    GI bleed    while on triple anticoag therapy   HTN (hypertension)    Pacemaker    PAF (paroxysmal atrial fibrillation) (HCC)    s/p MAZE at time of CABG   PVD (peripheral vascular disease) (HCC)    R CEA 2011, prior PTA, left fem-pop 2014   Stroke (HCC)    vs TIA   Tubular adenoma of colon    Type II diabetes mellitus (HCC)    Past Surgical History:  Procedure Laterality Date   ABDOMINAL AORTAGRAM N/A 01/07/2012   Procedure: ABDOMINAL Ronny Flurry;  Surgeon: Chuck Hint, MD;  Location: [facility] CATH LAB;  Service: Cardiovascular;  Laterality: N/A;   BALLOON DILATION N/A 02/17/2013   Procedure: BALLOON DILATION;  Surgeon: Carie Caddy  Pyrtle, MD;  Location: [facility] ENDOSCOPY;  Service: Gastroenterology;  Laterality: N/A;   BIOPSY  03/17/2021   Procedure: BIOPSY;  Surgeon: Iva Boop, MD;  Location: [facility] ENDOSCOPY;  Service: Gastroenterology;;   BIOPSY  08/26/2022   Procedure: BIOPSY;  Surgeon: Lemar Lofty., MD;  Location: [facility] ENDOSCOPY;  Service: Gastroenterology;;   CAROTID ENDARTERECTOMY Right 03/2009   CATARACT EXTRACTION Bilateral    CHOLECYSTECTOMY     CIRCUMCISION     COLONOSCOPY WITH PROPOFOL N/A 02/17/2013   Procedure: COLONOSCOPY WITH PROPOFOL;   Surgeon: Beverley Fiedler, MD;  Location: [facility] ENDOSCOPY;  Service: Gastroenterology;  Laterality: N/A;   COLONOSCOPY WITH PROPOFOL N/A 03/18/2021   Procedure: COLONOSCOPY WITH PROPOFOL;  Surgeon: Jeani Hawking, MD;  Location: [facility] ENDOSCOPY;  Service: Gastroenterology;  Laterality: N/A;   CORONARY ARTERY BYPASS GRAFT  06/14/2011   Procedure: CORONARY ARTERY BYPASS GRAFTING (CABG);  Surgeon: Alleen Borne, MD;  Location: [facility] OR;  Service: Open Heart Surgery;  Laterality: N/A;  Coronary Artery Bypass Graft on pump times six;  utilizing internal mammary artery and right greater saphenous vein harvested endoscopically.x 5 vessels   ELECTROPHYSIOLOGY STUDY N/A 11/05/2012   Procedure: ELECTROPHYSIOLOGY STUDY;  Surgeon: Duke Salvia, MD;  Location: [facility] CATH LAB;  Service: Cardiovascular;  Laterality: N/A;   ESOPHAGOGASTRODUODENOSCOPY N/A 08/26/2022   Procedure: ESOPHAGOGASTRODUODENOSCOPY (EGD);  Surgeon: Lemar Lofty., MD;  Location: [facility] ENDOSCOPY;  Service: Gastroenterology;  Laterality: N/A;   ESOPHAGOGASTRODUODENOSCOPY (EGD) WITH PROPOFOL N/A 02/17/2013   Procedure: ESOPHAGOGASTRODUODENOSCOPY (EGD) WITH PROPOFOL;  Surgeon: Beverley Fiedler, MD;  Location: [facility] ENDOSCOPY;  Service: Gastroenterology;  Laterality: N/A;   ESOPHAGOGASTRODUODENOSCOPY (EGD) WITH PROPOFOL N/A 03/17/2021   Procedure: ESOPHAGOGASTRODUODENOSCOPY (EGD) WITH PROPOFOL;  Surgeon: Iva Boop, MD;  Location: [facility] ENDOSCOPY;  Service: Gastroenterology;  Laterality: N/A;   ESOPHAGOGASTRODUODENOSCOPY (EGD) WITH PROPOFOL N/A 09/22/2022   Procedure: ESOPHAGOGASTRODUODENOSCOPY (EGD) WITH PROPOFOL;  Surgeon: Meridee Score Netty Starring., MD;  Location: [facility] ENDOSCOPY;  Service: Gastroenterology;  Laterality: N/A;   FEMORAL-POPLITEAL BYPASS GRAFT Left 03/06/2012   Procedure: BYPASS GRAFT FEMORAL-POPLITEAL ARTERY;  Surgeon: Chuck Hint, MD;  Location: [facility] OR;  Service: Vascular;  Laterality: Left;  Left Femoral - Below Knee Popliteal Bypass Graft with  Vein and Intraoperative Arteriogram.   GI RADIOFREQUENCY ABLATION  08/26/2022   Procedure: GI RADIOFREQUENCY ABLATION;  Surgeon: Lemar Lofty., MD;  Location: [facility] ENDOSCOPY;  Service: Gastroenterology;;   GIVENS CAPSULE STUDY N/A 08/26/2022   Procedure: GIVENS CAPSULE STUDY;  Surgeon: Lemar Lofty., MD;  Location: [facility] ENDOSCOPY;  Service: Gastroenterology;  Laterality: N/A;   HOT HEMOSTASIS N/A 09/22/2022   Procedure: HOT HEMOSTASIS (ARGON PLASMA COAGULATION/BICAP);  Surgeon: Lemar Lofty., MD;  Location: [facility] ENDOSCOPY;  Service: Gastroenterology;  Laterality: N/A;   IR PARACENTESIS  05/23/2022   JOINT REPLACEMENT Left    left TKA   KNEE ARTHROSCOPY Left    "I had 3" (11/05/2012)   LEAD REVISION  11/05/2012   pacemaker/notes 11/05/2012   LEAD REVISION N/A 11/05/2012   Procedure: LEAD REVISION;  Surgeon: Duke Salvia, MD;  Location: [facility] CATH LAB;  Service: Cardiovascular;  Laterality: N/A;   MAZE  06/14/2011   Procedure: MAZE;  Surgeon: Alleen Borne, MD;  Location: [facility] OR;  Service: Open Heart Surgery;  Laterality: N/A;  Ligate left atrial appendage   PERMANENT PACEMAKER INSERTION N/A 06/20/2011   Procedure: PERMANENT PACEMAKER INSERTION;  Surgeon: Duke Salvia, MD;  Location: [facility] CATH LAB;  Service: Cardiovascular;  Laterality: N/A;  POLYPECTOMY  03/18/2021   Procedure: POLYPECTOMY;  Surgeon: Jeani Hawking, MD;  Location: [facility] ENDOSCOPY;  Service: Gastroenterology;;   TONSILLECTOMY AND ADENOIDECTOMY     TOTAL KNEE ARTHROPLASTY Left     Social History:  reports that he quit smoking about 43 years ago. His smoking use included cigarettes. He started smoking about 75 years ago. He has a 96 pack-year smoking history. He has never used smokeless tobacco. He reports current alcohol use of about 2.0 standard drinks of alcohol per week. He reports that he does not use drugs.   Allergies  Allergen Reactions   Lipitor [Atorvastatin Calcium] Other (See Comments)    Weakness/  pain in legs    Family History  Problem Relation Age of Onset   Coronary artery disease Father    Hypertension Father    Heart disease Father        Heart Disease before age 30   Heart attack Mother    Coronary artery disease Mother    Deep vein thrombosis Mother    Heart disease Mother        Heart Disease before age 77   Hyperlipidemia Mother    Hypertension Mother    Hypertension Sister    Heart disease Sister        Heart Disease before age 59   Hyperlipidemia Sister    Heart disease Brother        Heart Disease before age 22   Colon cancer Brother 61   Anesthesia problems Neg Hx    Diabetes Neg Hx      Prior to Admission medications   Medication Sig Start Date End Date Taking? Authorizing Provider  isosorbide mononitrate (IMDUR) 30 MG 24 hr tablet Take by mouth.  TAKE ONE TABLET BY MOUTH ONCE A DAY FOR HEART- DO NOT TAKE SILDENAFIL AS THIS MEDICATION IS CONTRAINDICATED WITH ISOSORBIDE MONONITRATE 09/05/22  Yes [provider]  naloxone (NARCAN) nasal spray 4 mg/0.1 mL Place into the nose. SPRAY 1 SPRAY INTO ONE NOSTRIL AS DIRECTED FOR OPIOID OVERDOSE - CALL 911 IMMEDIATELY, ADMINISTER DOSE, THEN TURN PERSON ON SIDE - IF NO RESPONSE IN 2-3 MINUTES OR PERSON RESPONDS BUT RELAPSES, REPEAT USING A NEW SPRAY DEVICE AND SPRAY INTO THE OTHER NOSTRIL 03/01/22  Yes [provider]  acetaminophen (TYLENOL) 650 MG CR tablet Take 100 mg by mouth daily.    [provider]  allopurinol (ZYLOPRIM) 100 MG tablet Take 100 mg by mouth daily.    [provider]  carvedilol (COREG) 12.5 MG tablet Take 6.25 mg by mouth 2 (two) times daily with a meal.    [provider]  cholecalciferol (VITAMIN D3) 25 MCG (1000 UNIT) tablet Take 2,000 Units by mouth daily.    [provider]  cyanocobalamin 1000 MCG tablet Take 1,000 mcg by mouth daily.    [provider]  ferrous gluconate (FERGON) 324 MG tablet Take 1 tablet (324 mg total) by mouth  daily with breakfast. 09/23/22 12/22/22  Dahal, Melina Schools, MD  insulin glargine (LANTUS) 100 UNIT/ML injection Inject 48 Units into the skin in the morning.    [provider]  levothyroxine (SYNTHROID) 25 MCG tablet Take 25 mcg by mouth daily before breakfast.    [provider]  magnesium oxide (MAG-OX) 400 (240 Mg) MG tablet Take 2 tablets by mouth daily.    [provider]  Multiple Vitamin (MULTIVITAMIN) capsule Take 1 capsule by mouth daily.    [provider]  pantoprazole (PROTONIX) 40 MG  tablet Take 1 tablet (40 mg total) by mouth 2 (two) times daily. 08/28/22 10/27/22  [name], DO  Propylene Glycol (SYSTANE BALANCE) 0.6 % SOLN Place 1 drop into both eyes daily as needed. Patient not taking: Reported on 09/20/2022    [provider]  Semaglutide, 1 MG/DOSE, 2 MG/1.5ML SOPN Inject 1 mg into the skin every Sunday.    [provider]  sucralfate (CARAFATE) 1 g tablet Take 1 tablet (1 g total) by mouth 4 (four) times daily -  with meals and at bedtime. Take for 2 weeks and then take BID 09/23/22   Dahal, Melina Schools, MD  testosterone cypionate (DEPOTESTOSTERONE CYPIONATE) 200 MG/ML injection Inject 200 mg into the muscle every 14 (fourteen) days. Given every other Sunday    [provider]  Zinc 50 MG TABS Take 1 tablet (50 mg total) by mouth daily. 03/25/22   [name], MD    Physical Exam: BP 132/74   Pulse 66   Temp 97.6 F (36.4 C) (Oral)   Resp 17   Ht 5\' 10"  (1.778 m)   Wt 108 kg   SpO2 99%   BMI 34.15 kg/m   General:  Alert, oriented, calm, in no acute distress, speaking in full sentences, no cough, his wife is at the bedside.  He is lying flat in bed and saturating well. Cardiovascular: RRR, no murmurs or rubs, 3+ pitting bilateral lower extremity edema Respiratory: clear to auscultation bilaterally, no wheezes, some bibasilar crackles Abdomen: soft, nontender, distended with fluid wave, normal bowel tones heard   Skin: dry, no rashes  Musculoskeletal: no joint effusions, normal range of motion  Psychiatric: appropriate affect, normal speech  Neurologic: extraocular muscles intact, clear speech, moving all extremities with intact sensorium         Labs on Admission:  Basic Metabolic Panel: Recent Labs  Lab 10/26/22 1014  NA 134*  K 3.8  CL 100  CO2 23  GLUCOSE 136*  BUN 45*  CREATININE 2.79*  CALCIUM 8.0*   Liver Function Tests: Recent Labs  Lab 10/26/22 1014  AST 25  ALT 12  ALKPHOS 151*  BILITOT 1.0  PROT 7.0  ALBUMIN 3.0*   Recent Labs  Lab 10/26/22 1014  LIPASE 45   No results for input(s): "AMMONIA" in the last 168 hours. CBC: Recent Labs  Lab 10/26/22 1014  WBC 7.3  NEUTROABS 5.1  HGB 9.7*  HCT 33.2*  MCV 93.3  PLT 210   Cardiac Enzymes: No results for input(s): "CKTOTAL", "CKMB", "CKMBINDEX", "TROPONINI" in the last 168 hours.  BNP (last 3 results) Recent Labs    05/25/22 0945 08/18/22 1422 10/26/22 1245  BNP 182.2* 187.6* 300.1*    ProBNP (last 3 results) No results for input(s): "PROBNP" in the last 8760 hours.  CBG: No results for input(s): "GLUCAP" in the last 168 hours.  Radiological Exams on Admission: DG Chest Portable 1 View  Result Date: 10/26/2022 CLINICAL DATA:  Shortness of breath.  History of cirrhosis. EXAM: PORTABLE CHEST 1 VIEW COMPARISON:  08/18/2022. FINDINGS: There are diffuse increased interstitial markings when compared to the prior exam. Bilateral lung fields are otherwise clear. No acute consolidation or lung collapse. Bilateral costophrenic angles are clear. Stable cardio-mediastinal silhouette. There are surgical staples along the heart border and sternotomy wires, status post CABG (coronary artery bypass graft). Left atrial appendage closure device again seen. There is a left sided 3-lead pacemaker. No acute osseous abnormalities. The soft tissues are within normal limits. IMPRESSION: 1.  Diffuse increased interstitial  markings when compared to the prior exam, most in keeping with mild pulmonary edema. 2. Otherwise no acute cardiopulmonary abnormalities. Electronically Signed   By: Jules Schick M.D.   On: 10/26/2022 13:23    Assessment/Plan Edward [name] is a 82 y.o. 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   Anasarca-likely contribution from his known hepatic cirrhosis and ascites, but I feel this mainly could be exacerbation of his known diastolic dysfunction, or development of new systolic dysfunction. -Inpatient admission to progressive -Telemetry monitoring -Continue aggressive diuresis with Lasix 80 mg IV twice daily  Heart failure with preserved EF-last echo 03/2022 with preserved EF and indeterminate diastolic parameters.  I have concern for worsening heart failure given anasarca despite aggressive outpatient diuretics. -Continue home Coreg -Heart healthy diet -Check 2D echo -Aggressive diuresis as above  Elevated alk phos-I suspect this is due to hepatic congestion from his current anasarca/fluid overload  NASH cirrhosis with abdominal ascites-patient is afebrile, abdomen nontender, and no leukocytosis.  Very low suspicion for SBP.  Could consider therapeutic paracentesis during this hospital stay if abdominal ascites fails to improve with diuretics.  I presume he is not on Aldactone due to his CKD.  CKD stage IV-appears to be at baseline, would monitor closely in the setting of aggressive diuresis  Paroxysmal atrial fibrillation-continue rate control with Coreg, patient and his wife have decided to forego Eliquis for the foreseeable future.  We had a conversation about balancing the risks and  benefits, they understand that he is at risk for CVA while off anticoagulation. -Continue home Coreg  Insulin-dependent type 2 diabetes-well-controlled, with hemoglobin A1c 5.0 on 08/18/2022 -Carb controlled diet -Lantus 35 units daily (reduced from 48 units daily) -Moderate dose sliding scale  History of GAVE-with recent EGD as mentioned above, recently no signs or symptoms of bleeding.  Hemoglobin is stable and actually improved. -Continue oral PPI twice daily -Continue Carafate 3 times daily with meals and at bedtime  Hypothyroidism-Synthroid  Deficiency anemia-stable, continue ferrous gluconate tablet  DVT prophylaxis: SCDs only, patient and wife declined any blood thinners due to previous GI bleeding.    Code Status: Full Code  Consults called: None  Admission status: The appropriate patient status for this patient is INPATIENT. Inpatient status is judged to be reasonable and necessary in order to provide the required intensity of service to ensure the patient's safety. The patient's presenting symptoms, physical exam findings, and initial radiographic and laboratory data in the context of their chronic comorbidities is felt to place them at high risk for further clinical deterioration. Furthermore, it is not anticipated that the patient will be medically stable for discharge from the hospital within 2 midnights of admission.    I certify that at the point of admission it is my clinical judgment that the patient will require inpatient hospital care spanning beyond 2 midnights from the point of admission due to high intensity of service, high risk for further deterioration and high frequency of surveillance required  Time spent: 59 minutes  Kamile Fassler Sharlette Dense MD [facility] Pager [phone]  If 7PM-7AM, please contact night-coverage [email or web address] Password [email or web address]  10/26/2022, 2:21 PM

## 2022-10-26 NOTE — ED Notes (Signed)
Patient and wife at nursing station asking for crackers, beverage and update on bed status. Checked patient's orders and took him a ginger ale and crackers. Updated them that we are waiting on a bed to be ready and once we had more information we would let them know. Patient and wife verbalized understanding.

## 2022-10-26 NOTE — ED Triage Notes (Signed)
Pt arrived and states he has gained weight and has ascites getting worse for over a month. Had had a paracentesis previously. Hx of Cirrhosis. Denies abdominal pain

## 2022-10-27 ENCOUNTER — Inpatient Hospital Stay (HOSPITAL_COMMUNITY): Payer: No Typology Code available for payment source

## 2022-10-27 DIAGNOSIS — D638 Anemia in other chronic diseases classified elsewhere: Secondary | ICD-10-CM | POA: Diagnosis not present

## 2022-10-27 DIAGNOSIS — I5033 Acute on chronic diastolic (congestive) heart failure: Secondary | ICD-10-CM

## 2022-10-27 DIAGNOSIS — I5043 Acute on chronic combined systolic (congestive) and diastolic (congestive) heart failure: Secondary | ICD-10-CM | POA: Diagnosis not present

## 2022-10-27 DIAGNOSIS — N184 Chronic kidney disease, stage 4 (severe): Secondary | ICD-10-CM

## 2022-10-27 DIAGNOSIS — R601 Generalized edema: Secondary | ICD-10-CM | POA: Diagnosis not present

## 2022-10-27 LAB — BASIC METABOLIC PANEL
Anion gap: 9 (ref 5–15)
BUN: 46 mg/dL — ABNORMAL HIGH (ref 8–23)
CO2: 28 mmol/L (ref 22–32)
Calcium: 8.3 mg/dL — ABNORMAL LOW (ref 8.9–10.3)
Chloride: 100 mmol/L (ref 98–111)
Creatinine, Ser: 2.67 mg/dL — ABNORMAL HIGH (ref 0.61–1.24)
GFR, Estimated: 23 mL/min — ABNORMAL LOW (ref >60)
Glucose, Bld: 73 mg/dL (ref 70–99)
Potassium: 3.3 mmol/L — ABNORMAL LOW (ref 3.5–5.1)
Sodium: 137 mmol/L (ref 135–145)

## 2022-10-27 LAB — CBG MONITORING, ED: Glucose-Capillary: 98 mg/dL (ref 70–99)

## 2022-10-27 LAB — ECHOCARDIOGRAM COMPLETE
Area-P 1/2: 4.04 cm2
Calc EF: 55.7 %
Height: 70 in
MV M vel: 2.66 m/s
MV Peak grad: 28.3 mm[Hg]
S' Lateral: 3.4 cm
Single Plane A2C EF: 37.9 %
Single Plane A4C EF: 66.1 %
Weight: 3671.98 [oz_av]

## 2022-10-27 LAB — GLUCOSE, CAPILLARY
Glucose-Capillary: 73 mg/dL (ref 70–99)
Glucose-Capillary: 83 mg/dL (ref 70–99)
Glucose-Capillary: 88 mg/dL (ref 70–99)

## 2022-10-27 MED ORDER — LIDOCAINE HCL 1 % IJ SOLN
INTRAMUSCULAR | Status: AC
  Filled 2022-10-27: qty 20

## 2022-10-27 MED ORDER — POTASSIUM CHLORIDE CRYS ER 20 MEQ PO TBCR
40.0000 meq | EXTENDED_RELEASE_TABLET | Freq: Once | ORAL | Status: AC
  Administered 2022-10-27: 40 meq via ORAL
  Filled 2022-10-27: qty 2

## 2022-10-27 NOTE — Progress Notes (Signed)
Patient has hematoma on left elbow - states he bumped it against the door while leaving a restaurant PTA. Arrived onto unit with gauze dressing with paper tape in place. Added to admission skin assessment

## 2022-10-27 NOTE — ED Notes (Signed)
Pt taken for paracentesis

## 2022-10-27 NOTE — Procedures (Signed)
PROCEDURE SUMMARY:  Successful image-guided paracentesis from the right lower abdomen.  Yielded 3.3 liters of hazy yellow fluid.  No immediate complications.  EBL = trace. Patient tolerated well.   Specimen was not sent for labs.  Please see imaging section of Epic for full dictation.   Lynann Bologna Andersson Larrabee PA-C 10/27/2022 10:31 AM

## 2022-10-27 NOTE — Progress Notes (Signed)
  Echocardiogram 2D Echocardiogram has been performed.  Edward [name] 10/27/2022, 3:32 PM

## 2022-10-27 NOTE — Progress Notes (Signed)
[facility] PROGRESS NOTE   Edward [name] MWU:[id number] DOB: [birth year] DOA: 10/26/2022  PCP: [facility]  Brief History: 82 y.o. 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  Patient mentions that he usually is around 200 pounds at baseline.  Noted to be 237 pounds at admission.  Started on IV furosemide.     Consultants: None yet  Procedures: None    Subjective/Interval History: Patient mentions that he wants to go home but understands that he needs to stay for the swelling to improve.  Also mentions that his abdomen is distended as well.    Assessment/Plan:  Anasarca Likely due to his liver cirrhosis.  Also being contributed by his chronic kidney disease.  He also has diastolic CHF. Continue with high-dose furosemide.  Monitor ins and outs and daily weights.  His weight measurement is 237 pounds.  He needs to come down to close to 200 pounds. He is concerned about ascites.  Will do a limited ultrasound and if there is significant ascites then he may benefit from paracentesis.  Acute on chronic diastolic CHF Last echocardiogram from March showed preserved EF.  Repeat echo has been ordered.  Continue IV Lasix. Chest x-ray suggested mild pulmonary edema.  Saturations are normal on room air.  NASH cirrhosis with ascites Abdomen is stable.  Limited ultrasound to see how much volume he has in his abdominal cavity.  May benefit from paracentesis.  Chronic kidney disease stage IV His creatinine was 3.25 when he was discharged on 9/8.  Came in with creatinine of 2.79.  Labs are pending from this morning. Monitor urine output.   Avoid nephrotoxic agents.  Paroxysmal atrial fibrillation Patient and his wife have decided to discontinue Eliquis for foreseeable future in the setting of recent GI bleed. Continue with carvedilol.  Insulin-dependent type 2 diabetes mellitus HbA1c 5.0 in August.  Noted to be on Lantus as well as SSI.  Monitor CBGs.  Hypothyroidism Continue with levothyroxine.  Normocytic anemia/likely anemia of chronic disease Stable hemoglobin.  Recent GI bleed episode noted.  History of GAVE Recent EGD on 9/7 showed grade 1 esophageal varices distally.  Nonbleeding gastric ulcers with clean base noted.  GAVE was noted without bleeding but noted to be improved from previous upper endoscopy done in August.  Obesity Estimated body mass index is 34.15 kg/m as calculated from the following:   Height as of this encounter: 5\' 10"  (1.778 m).   Weight as of this encounter: 108 kg.   DVT Prophylaxis: SCDs only.  Patient and wife declined anticoagulants Code Status: Full code Family Communication: Discussed with patient Disposition Plan: Hopefully return home when improved.  Mobilize.  Status is: Inpatient Remains inpatient appropriate because: Anasarca requiring IV diuretics      Medications: Scheduled:  carvedilol  6.25 mg Oral BID WC   ferrous gluconate  324 mg Oral Q breakfast   furosemide  80 mg Intravenous BID   insulin aspart  0-15 Units Subcutaneous TID WC   insulin aspart  0-5 Units Subcutaneous QHS   insulin glargine-yfgn  35 Units Subcutaneous Daily   levothyroxine  25 mcg Oral QAC breakfast   sucralfate  1 g Oral TID WC & HS   Continuous: VOZ:[id number] **  OR** acetaminophen, ondansetron **OR** ondansetron (ZOFRAN) IV, traZODone  Antibiotics: Anti-infectives (From admission, onward)    None       Objective:  Vital Signs  Vitals:   10/27/22 0500 10/27/22 0600 10/27/22 0801 10/27/22 0900  BP: (!) 146/63 121/62  (!) 142/92  Pulse: 77 66  70  Resp: 15 14  16   Temp:    (!) 97.4 F (36.3 C)   TempSrc:   Oral   SpO2: 91% 97%  95%  Weight:      Height:        Intake/Output Summary (Last 24 hours) at 10/27/2022 0933 Last data filed at 10/27/2022 0249 Gross per 24 hour  Intake 200 ml  Output 900 ml  Net -700 ml   Filed Weights   10/26/22 0939  Weight: 108 kg    General appearance: Awake alert.  In no distress Resp: Clear to auscultation bilaterally.  Normal effort Cardio: S1-S2 is normal regular.  No S3-S4.  No rubs murmurs or bruit GI: Abdomen is soft.  Noted to be distended.  Ascites is present.  No masses organomegaly. Extremities: 2-3+ edema bilateral lower extremities extending all the way up to the scrotum. Neurologic: Alert and oriented x3.  No focal neurological deficits.    Lab Results:  Data Reviewed: I have personally reviewed following labs and reports of the imaging studies  CBC: Recent Labs  Lab 10/26/22 1014  WBC 7.3  NEUTROABS 5.1  HGB 9.7*  HCT 33.2*  MCV 93.3  PLT 210    Basic Metabolic Panel: Recent Labs  Lab 10/26/22 1014  NA 134*  K 3.8  CL 100  CO2 23  GLUCOSE 136*  BUN 45*  CREATININE 2.79*  CALCIUM 8.0*    GFR: Estimated Creatinine Clearance: 26 mL/min (A) (by C-G formula based on SCr of 2.79 mg/dL (H)).  Liver Function Tests: Recent Labs  Lab 10/26/22 1014  AST 25  ALT 12  ALKPHOS 151*  BILITOT 1.0  PROT 7.0  ALBUMIN 3.0*    Recent Labs  Lab 10/26/22 1014  LIPASE 45   Coagulation Profile: Recent Labs  Lab 10/26/22 1245  INR 1.4*    CBG: Recent Labs  Lab 10/26/22 1711 10/26/22 2217 10/27/22 0758  GLUCAP 101* 113* 98    Radiology Studies: DG Chest Portable 1 View  Result Date: 10/26/2022 CLINICAL DATA:  Shortness of breath.  History of cirrhosis. EXAM: PORTABLE CHEST 1 VIEW COMPARISON:  08/18/2022. FINDINGS: There are diffuse increased interstitial markings when compared to the prior exam. Bilateral lung fields are otherwise clear. No acute consolidation or lung  collapse. Bilateral costophrenic angles are clear. Stable cardio-mediastinal silhouette. There are surgical staples along the heart border and sternotomy wires, status post CABG (coronary artery bypass graft). Left atrial appendage closure device again seen. There is a left sided 3-lead pacemaker. No acute osseous abnormalities. The soft tissues are within normal limits. IMPRESSION: 1. Diffuse increased interstitial markings when compared to the prior exam, most in keeping with mild pulmonary edema. 2. Otherwise no acute cardiopulmonary abnormalities. Electronically Signed   By: Jules Schick M.D.   On: 10/26/2022 13:23       LOS: 1 day   Osvaldo Shipper  [facility] Pager on [email or web address]  10/27/2022, 9:33 AM

## 2022-10-27 NOTE — ED Notes (Signed)
ED TO INPATIENT HANDOFF REPORT  ED Nurse Name and Phone #: Suzanna Obey [phone]  S Name/Age/Gender Edward [name] 82 y.o. male Room/Bed: WA19/WA19  Code Status   Code Status: Full Code  Home/SNF/Other Home Patient oriented to: self, place, time, and situation Is this baseline? Yes   Triage Complete: Triage complete  Chief Complaint Anasarca [R60.1]  Triage Note Pt arrived and states he has gained weight and has ascites getting worse for over a month. Had had a paracentesis previously. Hx of Cirrhosis. Denies abdominal pain     Allergies Allergies  Allergen Reactions   Lipitor [Atorvastatin Calcium] Other (See Comments)    Weakness/ pain in legs    Level of Care/Admitting Diagnosis ED Disposition     ED Disposition  Admit   Condition  --   Comment  Hospital Area: [facility] [[id number]]  Level of Care: Progressive [102]  Admit to Progressive based on following criteria: CARDIOVASCULAR & THORACIC of moderate stability with acute coronary syndrome symptoms/low risk myocardial infarction/hypertensive urgency/arrhythmias/heart failure potentially compromising stability and stable post cardiovascular intervention patients.  May admit patient to [facility] or [facility] if equivalent level of care is available:: Yes  Covid Evaluation: Asymptomatic - no recent exposure (last 10 days) testing not required  Diagnosis: Anasarca [[id number]]  Admitting Physician: Maryln Gottron [[id number]]  Attending Physician: Maryln Gottron [[id number]]  Certification:: I certify this patient will need inpatient services for at least 2 midnights  Expected Medical Readiness: 10/28/2022          B Medical/Surgery History Past Medical History:  Diagnosis Date   Acute on chronic diastolic heart failure (HCC) 03/20/2022   Anemia    ?   Carotid artery occlusion    Chronic diastolic CHF (congestive heart failure) (HCC) 04/23/2012   CKD (chronic kidney disease), stage III  (HCC)    Claudication (HCC)    Coronary artery disease    s/p CABG 2013   Diabetic neuropathy (HCC)    mostly feet/ legs   Diverticulosis    Dyslipidemia    Exertional shortness of breath    Fracture of one rib, left side, initial encounter for closed fracture 02/01/2021   Gastritis and gastroduodenitis    GERD (gastroesophageal reflux disease)    GI bleed    while on triple anticoag therapy   HTN (hypertension)    Pacemaker    PAF (paroxysmal atrial fibrillation) (HCC)    s/p MAZE at time of CABG   PVD (peripheral vascular disease) (HCC)    R CEA 2011, prior PTA, left fem-pop 2014   Stroke (HCC)    vs TIA   Tubular adenoma of colon    Type II diabetes mellitus (HCC)    Past Surgical History:  Procedure Laterality Date   ABDOMINAL AORTAGRAM N/A 01/07/2012   Procedure: ABDOMINAL Ronny Flurry;  Surgeon: Chuck Hint, MD;  Location: [facility] CATH LAB;  Service: Cardiovascular;  Laterality: N/A;   BALLOON DILATION N/A 02/17/2013   Procedure: BALLOON DILATION;  Surgeon: Beverley Fiedler, MD;  Location: [facility] ENDOSCOPY;  Service: Gastroenterology;  Laterality: N/A;   BIOPSY  03/17/2021   Procedure: BIOPSY;  Surgeon: Iva Boop, MD;  Location: [facility] ENDOSCOPY;  Service: Gastroenterology;;   BIOPSY  08/26/2022   Procedure: BIOPSY;  Surgeon: Lemar Lofty., MD;  Location: [facility] ENDOSCOPY;  Service: Gastroenterology;;   CAROTID ENDARTERECTOMY Right 03/2009   CATARACT EXTRACTION Bilateral    CHOLECYSTECTOMY     CIRCUMCISION  COLONOSCOPY WITH PROPOFOL N/A 02/17/2013   Procedure: COLONOSCOPY WITH PROPOFOL;  Surgeon: Beverley Fiedler, MD;  Location: [facility] ENDOSCOPY;  Service: Gastroenterology;  Laterality: N/A;   COLONOSCOPY WITH PROPOFOL N/A 03/18/2021   Procedure: COLONOSCOPY WITH PROPOFOL;  Surgeon: Jeani Hawking, MD;  Location: [facility] ENDOSCOPY;  Service: Gastroenterology;  Laterality: N/A;   CORONARY ARTERY BYPASS GRAFT  06/14/2011   Procedure: CORONARY ARTERY BYPASS GRAFTING (CABG);  Surgeon: Alleen Borne, MD;  Location: [facility] OR;  Service: Open Heart Surgery;  Laterality: N/A;  Coronary Artery Bypass Graft on pump times six;  utilizing internal mammary artery and right greater saphenous vein harvested endoscopically.x 5 vessels   ELECTROPHYSIOLOGY STUDY N/A 11/05/2012   Procedure: ELECTROPHYSIOLOGY STUDY;  Surgeon: Duke Salvia, MD;  Location: [facility] CATH LAB;  Service: Cardiovascular;  Laterality: N/A;   ESOPHAGOGASTRODUODENOSCOPY N/A 08/26/2022   Procedure: ESOPHAGOGASTRODUODENOSCOPY (EGD);  Surgeon: Lemar Lofty., MD;  Location: [facility] ENDOSCOPY;  Service: Gastroenterology;  Laterality: N/A;   ESOPHAGOGASTRODUODENOSCOPY (EGD) WITH PROPOFOL N/A 02/17/2013   Procedure: ESOPHAGOGASTRODUODENOSCOPY (EGD) WITH PROPOFOL;  Surgeon: Beverley Fiedler, MD;  Location: [facility] ENDOSCOPY;  Service: Gastroenterology;  Laterality: N/A;   ESOPHAGOGASTRODUODENOSCOPY (EGD) WITH PROPOFOL N/A 03/17/2021   Procedure: ESOPHAGOGASTRODUODENOSCOPY (EGD) WITH PROPOFOL;  Surgeon: Iva Boop, MD;  Location: [facility] ENDOSCOPY;  Service: Gastroenterology;  Laterality: N/A;   ESOPHAGOGASTRODUODENOSCOPY (EGD) WITH PROPOFOL N/A 09/22/2022   Procedure: ESOPHAGOGASTRODUODENOSCOPY (EGD) WITH PROPOFOL;  Surgeon: Meridee Score Netty Starring., MD;  Location: [facility] ENDOSCOPY;  Service: Gastroenterology;  Laterality: N/A;   FEMORAL-POPLITEAL BYPASS GRAFT Left 03/06/2012   Procedure: BYPASS GRAFT FEMORAL-POPLITEAL ARTERY;  Surgeon: Chuck Hint, MD;  Location: [facility] OR;  Service: Vascular;  Laterality: Left;  Left Femoral - Below Knee Popliteal Bypass Graft with Vein and Intraoperative Arteriogram.   GI RADIOFREQUENCY ABLATION  08/26/2022   Procedure: GI RADIOFREQUENCY ABLATION;  Surgeon: Lemar Lofty., MD;  Location: [facility] ENDOSCOPY;  Service: Gastroenterology;;   GIVENS CAPSULE STUDY N/A 08/26/2022   Procedure: GIVENS CAPSULE STUDY;  Surgeon: Lemar Lofty., MD;  Location: [facility] ENDOSCOPY;  Service: Gastroenterology;  Laterality: N/A;    HOT HEMOSTASIS N/A 09/22/2022   Procedure: HOT HEMOSTASIS (ARGON PLASMA COAGULATION/BICAP);  Surgeon: Lemar Lofty., MD;  Location: [facility] ENDOSCOPY;  Service: Gastroenterology;  Laterality: N/A;   IR PARACENTESIS  05/23/2022   JOINT REPLACEMENT Left    left TKA   KNEE ARTHROSCOPY Left    "I had 3" (11/05/2012)   LEAD REVISION  11/05/2012   pacemaker/notes 11/05/2012   LEAD REVISION N/A 11/05/2012   Procedure: LEAD REVISION;  Surgeon: Duke Salvia, MD;  Location: [facility] CATH LAB;  Service: Cardiovascular;  Laterality: N/A;   MAZE  06/14/2011   Procedure: MAZE;  Surgeon: Alleen Borne, MD;  Location: [facility] OR;  Service: Open Heart Surgery;  Laterality: N/A;  Ligate left atrial appendage   PERMANENT PACEMAKER INSERTION N/A 06/20/2011   Procedure: PERMANENT PACEMAKER INSERTION;  Surgeon: Duke Salvia, MD;  Location: [facility] CATH LAB;  Service: Cardiovascular;  Laterality: N/A;   POLYPECTOMY  03/18/2021   Procedure: POLYPECTOMY;  Surgeon: Jeani Hawking, MD;  Location: [facility] ENDOSCOPY;  Service: Gastroenterology;;   TONSILLECTOMY AND ADENOIDECTOMY     TOTAL KNEE ARTHROPLASTY Left      A IV Location/Drains/Wounds Patient Lines/Drains/Airways Status     Active Line/Drains/Airways     Name Placement date Placement time Site Days   Peripheral IV 10/26/22 22 G Posterior;Right Wrist 10/26/22  1247  Wrist  1  Intake/Output Last 24 hours  Intake/Output Summary (Last 24 hours) at 10/27/2022 1020 Last data filed at 10/27/2022 0249 Gross per 24 hour  Intake 200 ml  Output 900 ml  Net -700 ml    Labs/Imaging Results for orders placed or performed during the hospital encounter of 10/26/22 (from the past 48 hour(s))  CBC with Differential     Status: Abnormal   Collection Time: 10/26/22 10:14 AM  Result Value Ref Range   WBC 7.3 4.0 - 10.5 K/uL   RBC 3.56 (L) 4.22 - 5.81 MIL/uL   Hemoglobin 9.7 (L) 13.0 - 17.0 g/dL   HCT 16.1 (L) 09.6 - 04.5 %   MCV 93.3 80.0 - 100.0 fL   MCH  27.2 26.0 - 34.0 pg   MCHC 29.2 (L) 30.0 - 36.0 g/dL   RDW 40.9 (H) 81.1 - 91.4 %   Platelets 210 150 - 400 K/uL   nRBC 0.0 0.0 - 0.2 %   Neutrophils Relative % 69 %   Neutro Abs 5.1 1.7 - 7.7 K/uL   Lymphocytes Relative 10 %   Lymphs Abs 0.7 0.7 - 4.0 K/uL   Monocytes Relative 14 %   Monocytes Absolute 1.1 (H) 0.1 - 1.0 K/uL   Eosinophils Relative 5 %   Eosinophils Absolute 0.4 0.0 - 0.5 K/uL   Basophils Relative 1 %   Basophils Absolute 0.1 0.0 - 0.1 K/uL   Immature Granulocytes 1 %   Abs Immature Granulocytes 0.04 0.00 - 0.07 K/uL    Comment: Performed at [facility], [address] [place], [place] [address]  Comprehensive metabolic panel     Status: Abnormal   Collection Time: 10/26/22 10:14 AM  Result Value Ref Range   Sodium 134 (L) 135 - 145 mmol/L   Potassium 3.8 3.5 - 5.1 mmol/L   Chloride 100 98 - 111 mmol/L   CO2 23 22 - 32 mmol/L   Glucose, Bld 136 (H) 70 - 99 mg/dL    Comment: Glucose reference range applies only to samples taken after fasting for at least 8 hours.   BUN 45 (H) 8 - 23 mg/dL   Creatinine, Ser 6.21 (H) 0.61 - 1.24 mg/dL   Calcium 8.0 (L) 8.9 - 10.3 mg/dL   Total Protein 7.0 6.5 - 8.1 g/dL   Albumin 3.0 (L) 3.5 - 5.0 g/dL   AST 25 15 - 41 U/L   ALT 12 0 - 44 U/L   Alkaline Phosphatase 151 (H) 38 - 126 U/L   Total Bilirubin 1.0 0.3 - 1.2 mg/dL   GFR, Estimated 22 (L) >60 mL/min    Comment: (NOTE) Calculated using the CKD-EPI Creatinine Equation (2021)    Anion gap 11 5 - 15    Comment: Performed at [facility], [address] [place], [place] [address]  Lipase, blood     Status: None   Collection Time: 10/26/22 10:14 AM  Result Value Ref Range   Lipase 45 11 - 51 U/L    Comment: Performed at [facility], [address] [place], [place] [address]  Protime-INR     Status: Abnormal   Collection Time: 10/26/22 12:45 PM  Result Value Ref Range   Prothrombin Time 17.2 (H) 11.4 - 15.2  seconds   INR 1.4 (H) 0.8 - 1.2    Comment: (NOTE) INR goal varies based on device and disease states. Performed at [facility], [address], [place], [place] [address]   Brain  natriuretic peptide     Status: Abnormal   Collection Time: 10/26/22 12:45 PM  Result Value Ref Range   B Natriuretic Peptide 300.1 (H) 0.0 - 100.0 pg/mL    Comment: Performed at [facility], [address], [place], [place] [address]  CBG monitoring, ED     Status: Abnormal   Collection Time: 10/26/22  5:11 PM  Result Value Ref Range   Glucose-Capillary 101 (H) 70 - 99 mg/dL    Comment: Glucose reference range applies only to samples taken after fasting for at least 8 hours.  CBG monitoring, ED     Status: Abnormal   Collection Time: 10/26/22 10:17 PM  Result Value Ref Range   Glucose-Capillary 113 (H) 70 - 99 mg/dL    Comment: Glucose reference range applies only to samples taken after fasting for at least 8 hours.  CBG monitoring, ED     Status: None   Collection Time: 10/27/22  7:58 AM  Result Value Ref Range   Glucose-Capillary 98 70 - 99 mg/dL    Comment: Glucose reference range applies only to samples taken after fasting for at least 8 hours.   DG Chest Portable 1 View  Result Date: 10/26/2022 CLINICAL DATA:  Shortness of breath.  History of cirrhosis. EXAM: PORTABLE CHEST 1 VIEW COMPARISON:  08/18/2022. FINDINGS: There are diffuse increased interstitial markings when compared to the prior exam. Bilateral lung fields are otherwise clear. No acute consolidation or lung collapse. Bilateral costophrenic angles are clear. Stable cardio-mediastinal silhouette. There are surgical staples along the heart border and sternotomy wires, status post CABG (coronary artery bypass graft). Left atrial appendage closure device again seen. There is a left sided 3-lead pacemaker. No acute osseous abnormalities. The soft tissues are within normal limits. IMPRESSION: 1. Diffuse  increased interstitial markings when compared to the prior exam, most in keeping with mild pulmonary edema. 2. Otherwise no acute cardiopulmonary abnormalities. Electronically Signed   By: Jules Schick M.D.   On: 10/26/2022 13:23    Pending Labs Unresulted Labs (From admission, onward)     Start     Ordered   10/28/22 0500  CBC  Daily,   R      10/27/22 0942   10/28/22 0500  Basic metabolic panel  Daily,   R      10/27/22 0942   10/27/22 0944  Basic metabolic panel  Once,   R        10/27/22 0943            Vitals/Pain Today's Vitals   10/27/22 0505 10/27/22 0600 10/27/22 0801 10/27/22 0900  BP:  121/62  (!) 142/92  Pulse:  66  70  Resp:  14  16  Temp:   (!) 97.4 F (36.3 C)   TempSrc:   Oral   SpO2:  97%  95%  Weight:      Height:      PainSc: Asleep       Isolation Precautions No active isolations  Medications Medications  carvedilol (COREG) tablet 6.25 mg (6.25 mg Oral Given 10/27/22 0817)  levothyroxine (SYNTHROID) tablet 25 mcg (25 mcg Oral Given 10/27/22 0517)  sucralfate (CARAFATE) tablet 1 g (1 g Oral Given by Other 10/27/22 0819)  ferrous gluconate (FERGON) tablet 324 mg (324 mg Oral Given 10/27/22 0949)  insulin aspart (novoLOG) injection 0-15 Units ( Subcutaneous Not Given 10/27/22 0948)  insulin aspart (novoLOG) injection 0-5 Units ( Subcutaneous Not Given 10/26/22 2218)  acetaminophen (TYLENOL) tablet 650  mg (has no administration in time range)    Or  acetaminophen (TYLENOL) suppository 650 mg (has no administration in time range)  traZODone (DESYREL) tablet 25 mg (has no administration in time range)  ondansetron (ZOFRAN) tablet 4 mg (has no administration in time range)    Or  ondansetron (ZOFRAN) injection 4 mg (has no administration in time range)  furosemide (LASIX) injection 80 mg (80 mg Intravenous Given 10/27/22 0825)  insulin glargine-yfgn (SEMGLEE) injection 35 Units (35 Units Subcutaneous Given 10/27/22 0952)  furosemide (LASIX)  injection 80 mg (80 mg Intravenous Given 10/26/22 1248)    Mobility walks     Focused Assessments N/A   R Recommendations: See Admitting Provider Note  Report given to:   Additional Notes: N/A

## 2022-10-28 DIAGNOSIS — R601 Generalized edema: Secondary | ICD-10-CM | POA: Diagnosis not present

## 2022-10-28 DIAGNOSIS — I5043 Acute on chronic combined systolic (congestive) and diastolic (congestive) heart failure: Secondary | ICD-10-CM | POA: Insufficient documentation

## 2022-10-28 LAB — CBC
HCT: 31.6 % — ABNORMAL LOW (ref 39.0–52.0)
Hemoglobin: 9.4 g/dL — ABNORMAL LOW (ref 13.0–17.0)
MCH: 27.4 pg (ref 26.0–34.0)
MCHC: 29.7 g/dL — ABNORMAL LOW (ref 30.0–36.0)
MCV: 92.1 fL (ref 80.0–100.0)
Platelets: 202 10*3/uL (ref 150–400)
RBC: 3.43 MIL/uL — ABNORMAL LOW (ref 4.22–5.81)
RDW: 18 % — ABNORMAL HIGH (ref 11.5–15.5)
WBC: 7.9 10*3/uL (ref 4.0–10.5)
nRBC: 0 % (ref 0.0–0.2)

## 2022-10-28 LAB — BASIC METABOLIC PANEL
Anion gap: 9 (ref 5–15)
BUN: 42 mg/dL — ABNORMAL HIGH (ref 8–23)
CO2: 27 mmol/L (ref 22–32)
Calcium: 8.1 mg/dL — ABNORMAL LOW (ref 8.9–10.3)
Chloride: 100 mmol/L (ref 98–111)
Creatinine, Ser: 2.65 mg/dL — ABNORMAL HIGH (ref 0.61–1.24)
GFR, Estimated: 24 mL/min — ABNORMAL LOW (ref >60)
Glucose, Bld: 52 mg/dL — ABNORMAL LOW (ref 70–99)
Potassium: 3.4 mmol/L — ABNORMAL LOW (ref 3.5–5.1)
Sodium: 136 mmol/L (ref 135–145)

## 2022-10-28 LAB — GLUCOSE, CAPILLARY: Glucose-Capillary: 95 mg/dL (ref 70–99)

## 2022-10-28 LAB — MAGNESIUM: Magnesium: 2 mg/dL (ref 1.7–2.4)

## 2022-10-28 MED ORDER — FUROSEMIDE 20 MG PO TABS: 80.0000 mg | ORAL_TABLET | Freq: Two times a day (BID) | ORAL | Status: DC

## 2022-10-28 MED ORDER — POTASSIUM CHLORIDE CRYS ER 20 MEQ PO TBCR
40.0000 meq | EXTENDED_RELEASE_TABLET | Freq: Once | ORAL | Status: AC
  Administered 2022-10-28: 40 meq via ORAL
  Filled 2022-10-28: qty 2

## 2022-10-28 MED ORDER — ALLOPURINOL 100 MG PO TABS: 50.0000 mg | ORAL_TABLET | ORAL | 0 refills | Status: DC

## 2022-10-28 NOTE — Discharge Instructions (Signed)
Edward [name],  You were in the hospital with too much fluid in your body, including in your abdomen. You were treated with Lasix via IV and we performed a paracentesis for you. Thankfully, you have improved greatly. Please continue to take your Lasix as recommended by your doctor (I have changed the instructions on the prescription for you). I have also changed your allopurinol dose because of your chronic kidney disease to prevent accidental overdose.

## 2022-10-28 NOTE — Hospital Course (Signed)
Edward [name] is a 82 y.o. male with a history of chronic diastolic heart failure, CKD stage IV, CAD s/p CABG, diabetes mellitus, peripheral neuropathy, carotid artery stenosis, GERD, GAVE, gastritis.  Patient presented secondary to fluid buildup and found to have evidence of anasarca with mild acute heart failure. Patient managed with Lasix IV and paracentesis with improvement in symptoms.

## 2022-10-28 NOTE — Discharge Summary (Signed)
Physician Discharge Summary   Patient: Edward [name] MRN: [id number] DOB: 06/28/1940  Admit date:     10/26/2022  Discharge date: 10/28/22  Discharge Physician: Jacquelin Hawking, MD   PCP: [facility]   Recommendations at discharge:  Low sodium and fluid restricted diet PCP follow-up Continue diuretic therapy  Discharge Diagnoses: Principal Problem:   Anasarca Active Problems:   Cirrhosis of liver (HCC)   PAF (paroxysmal atrial fibrillation) (HCC)   Diabetes mellitus (HCC)   CKD (chronic kidney disease) stage 4, GFR 15-29 ml/min (HCC)   Benign essential hypertension   Coronary artery disease   Acute on chronic combined systolic and diastolic CHF (congestive heart failure) (HCC)  Resolved Problems:   * No resolved hospital problems. *  Hospital Course: WEBSTER PATRONE is a 82 y.o. male with a history of chronic diastolic heart failure, CKD stage IV, CAD s/p CABG, diabetes mellitus, peripheral neuropathy, carotid artery stenosis, GERD, GAVE, gastritis.  Patient presented secondary to fluid buildup and found to have evidence of anasarca with mild acute heart failure. Patient managed with Lasix IV and paracentesis with improvement in symptoms.  Assessment and Plan:  Anasarca Secondary to underlying cirrhosis and dietary indiscretion. Patient managed with Lasix IV with improvement.  Acute on chronic combined systolic and diastolic heart failure Transthoracic Echocardiogram this admission is significant for a mildly reduced LVEF of 50-55% in addition to evidence of reduced RVEF. Symptoms improved with Lasix IV. Resolved. Resume home Lasix on discharge.  NASH cirrhosis with ascites Noted. Patient underwent paracentesis on 10/12 yielding 3.3 L of hazy yellow fluid with no labs sent. Continue Lasix on discharge.  CKD stage IV Prior baseline appears to be around 3.25. Creatinine of 2.79 on admission with improvement to 2.65 on day of discharge. Continue Lasix on discharge.  Follow-up with nephrologist.  Paroxysmal atrial fibrillation Rate controlled. Continue Coreg. Not on anticoagulation secondary to history of GI bleeding.  Diabetes mellitus type 2 Controlled with last hemoglobin A1C of 5.0%. Continue home insulin and semaglutide  Hypothyroidism -Continue Synthroid  Primary hypertension Continue Imdur  Normocytic anemia Recent baseline around 8. Improved to 9.7 on admission and stable at 9.4 prior to discharge.  Gout Decrease to allopurinol 50 mg every other day secondary to renal function  History of GAVE Noted.  Obesity Estimated body mass index is 32.93 kg/m as calculated from the following:   Height as of this encounter: 5\' 10"  (1.778 m).   Weight as of this encounter: 104.1 kg.     Consultants: None Procedures performed: Paracentesis  Disposition: Home Diet recommendation: Low sodium; fluid restriction   DISCHARGE MEDICATION: Allergies as of 10/28/2022       Reactions   Lipitor [atorvastatin Calcium] Other (See Comments)   Weakness/ pain in legs        Medication List     TAKE these medications    allopurinol 100 MG tablet Commonly known as: ZYLOPRIM Take 0.5 tablets (50 mg total) by mouth every other day. What changed:  medication strength how much to take when to take this   Carboxymethylcellulose Sod PF 0.5 % Soln Place 1 drop into both eyes in the morning, at noon, in the evening, and at bedtime.   carvedilol 12.5 MG tablet Commonly known as: COREG Take 6.25 mg by mouth 2 (two) times daily with a meal.   cyanocobalamin 1000 MCG tablet Take 1,000 mcg by mouth daily.   ferrous gluconate 324 MG tablet Commonly known as: FERGON Take 1 tablet (324  Physician Discharge Summary   Patient: Edward [name] MRN: [id number] DOB: 06/28/1940  Admit date:     10/26/2022  Discharge date: 10/28/22  Discharge Physician: Jacquelin Hawking, MD   [facility]P: [facility]   Recommendations at discharge:  Low sodium and fluid restricted diet PCP follow-up Continue diuretic therapy  Discharge Diagnoses: Principal Problem:   Anasarca Active Problems:   Cirrhosis of liver (HCC)   PAF (paroxysmal atrial fibrillation) (HCC)   Diabetes mellitus (HCC)   CKD (chronic kidney disease) stage 4, GFR 15-29 ml/min (HCC)   Benign essential hypertension   Coronary artery disease   Acute on chronic combined systolic and diastolic CHF (congestive heart failure) (HCC)  Resolved Problems:   * No resolved hospital problems. *  Hospital Course: WEBSTER PATRONE is a 82 y.o. male with a history of chronic diastolic heart failure, CKD stage IV, CAD s/p CABG, diabetes mellitus, peripheral neuropathy, carotid artery stenosis, GERD, GAVE, gastritis.  Patient presented secondary to fluid buildup and found to have evidence of anasarca with mild acute heart failure. Patient managed with Lasix IV and paracentesis with improvement in symptoms.  Assessment and Plan:  Anasarca Secondary to underlying cirrhosis and dietary indiscretion. Patient managed with Lasix IV with improvement.  Acute on chronic combined systolic and diastolic heart failure Transthoracic Echocardiogram this admission is significant for a mildly reduced LVEF of 50-55% in addition to evidence of reduced RVEF. Symptoms improved with Lasix IV. Resolved. Resume home Lasix on discharge.  NASH cirrhosis with ascites Noted. Patient underwent paracentesis on 10/12 yielding 3.3 L of hazy yellow fluid with no labs sent. Continue Lasix on discharge.  CKD stage IV Prior baseline appears to be around 3.25. Creatinine of 2.79 on admission with improvement to 2.65 on day of discharge. Continue Lasix on discharge.  Follow-up with nephrologist.  Paroxysmal atrial fibrillation Rate controlled. Continue Coreg. Not on anticoagulation secondary to history of GI bleeding.  Diabetes mellitus type 2 Controlled with last hemoglobin A1C of 5.0%. Continue home insulin and semaglutide  Hypothyroidism -Continue Synthroid  Primary hypertension Continue Imdur  Normocytic anemia Recent baseline around 8. Improved to 9.7 on admission and stable at 9.4 prior to discharge.  Gout Decrease to allopurinol 50 mg every other day secondary to renal function  History of GAVE Noted.  Obesity Estimated body mass index is 32.93 kg/m as calculated from the following:   Height as of this encounter: 5\' 10"  (1.778 m).   Weight as of this encounter: 104.1 kg.     Consultants: None Procedures performed: Paracentesis  Disposition: Home Diet recommendation: Low sodium; fluid restriction   DISCHARGE MEDICATION: Allergies as of 10/28/2022       Reactions   Lipitor [atorvastatin Calcium] Other (See Comments)   Weakness/ pain in legs        Medication List     TAKE these medications    allopurinol 100 MG tablet Commonly known as: ZYLOPRIM Take 0.5 tablets (50 mg total) by mouth every other day. What changed:  medication strength how much to take when to take this   Carboxymethylcellulose Sod PF 0.5 % Soln Place 1 drop into both eyes in the morning, at noon, in the evening, and at bedtime.   carvedilol 12.5 MG tablet Commonly known as: COREG Take 6.25 mg by mouth 2 (two) times daily with a meal.   cyanocobalamin 1000 MCG tablet Take 1,000 mcg by mouth daily.   ferrous gluconate 324 MG tablet Commonly known as: FERGON Take 1 tablet (324  [id number]      Weight:       229.5 lb Date of Birth:  Dec 26, 1940       BSA:          2.213 m Patient Age:    82 years        BP:           122/63 mmHg Patient Gender: M               HR:           66 bpm. Exam Location:  Inpatient Procedure: 2D Echo, Cardiac Doppler and Color Doppler Indications:     CHF  History:         Patient has prior history of Echocardiogram examinations, most                  recent 03/21/2022. CHF, CAD, Pacemaker and Prior CABG, Stroke and                  Carotid Disease, Arrythmias:Atrial Fibrillation,                  Signs/Symptoms:Edema; Risk Factors:Diabetes, Hypertension and                  Dyslipidemia. CKD, anasarca.  Sonographer:     Milda Smart Referring Phys:  [id number] MIR M [facility] Diagnosing Phys: [place] IMPRESSIONS  1. Left ventricular ejection fraction, by estimation, is 50 to 55%. The left ventricle  has low normal function. The left ventricle has no regional wall motion abnormalities. There is moderate concentric left ventricular hypertrophy. Left ventricular diastolic parameters are indeterminate.  2. Right ventricular systolic function is moderately reduced. The right ventricular size is normal. There is mildly elevated pulmonary artery systolic pressure. The estimated right ventricular systolic pressure is 39.4 mmHg.  3. Left atrial size was mildly dilated.  4. Right atrial size was mildly dilated.  5. The mitral valve is grossly normal. Trivial mitral valve regurgitation.  6. Aortic valve regurgitation is trivial. Aortic valve sclerosis/calcification is present, without any evidence of aortic stenosis.  7. The inferior vena cava is dilated in size with <50% respiratory variability, suggesting right atrial pressure of 15 mmHg. FINDINGS  Left Ventricle: Left ventricular ejection fraction, by estimation, is 50 to 55%. The left ventricle has low normal function. The left ventricle has no regional wall motion abnormalities. The left ventricular internal cavity size was normal in size. There is moderate concentric left ventricular hypertrophy. Left ventricular diastolic parameters are indeterminate. Right Ventricle: The right ventricular size is normal. Right ventricular systolic function is moderately reduced. There is mildly elevated pulmonary artery systolic pressure. The tricuspid regurgitant velocity is 2.47 m/s, and with an assumed right atrial pressure of 15 mmHg, the estimated right ventricular systolic pressure is 39.4 mmHg. Left Atrium: Left atrial size was mildly dilated. Right Atrium: Right atrial size was mildly dilated. Pericardium: There is no evidence of pericardial effusion. Mitral Valve: The mitral valve is grossly normal. Trivial mitral valve regurgitation. Tricuspid Valve: The tricuspid valve is normal in structure. Tricuspid valve regurgitation is mild. Aortic Valve: Aortic valve  regurgitation is trivial. Aortic valve sclerosis/calcification is present, without any evidence of aortic stenosis. Pulmonic Valve: The pulmonic valve was normal in structure. Pulmonic valve regurgitation is mild. Aorta: The aortic root and ascending aorta are structurally normal, with no evidence of dilitation. Venous: The inferior vena cava is dilated in size with less than 50% respiratory variability, suggesting right atrial  [id number]      Weight:       229.5 lb Date of Birth:  Dec 26, 1940       BSA:          2.213 m Patient Age:    82 years        BP:           122/63 mmHg Patient Gender: M               HR:           66 bpm. Exam Location:  Inpatient Procedure: 2D Echo, Cardiac Doppler and Color Doppler Indications:     CHF  History:         Patient has prior history of Echocardiogram examinations, most                  recent 03/21/2022. CHF, CAD, Pacemaker and Prior CABG, Stroke and                  Carotid Disease, Arrythmias:Atrial Fibrillation,                  Signs/Symptoms:Edema; Risk Factors:Diabetes, Hypertension and                  Dyslipidemia. CKD, anasarca.  Sonographer:     Milda Smart Referring Phys:  [id number] MIR M [facility] Diagnosing Phys: [place] IMPRESSIONS  1. Left ventricular ejection fraction, by estimation, is 50 to 55%. The left ventricle  has low normal function. The left ventricle has no regional wall motion abnormalities. There is moderate concentric left ventricular hypertrophy. Left ventricular diastolic parameters are indeterminate.  2. Right ventricular systolic function is moderately reduced. The right ventricular size is normal. There is mildly elevated pulmonary artery systolic pressure. The estimated right ventricular systolic pressure is 39.4 mmHg.  3. Left atrial size was mildly dilated.  4. Right atrial size was mildly dilated.  5. The mitral valve is grossly normal. Trivial mitral valve regurgitation.  6. Aortic valve regurgitation is trivial. Aortic valve sclerosis/calcification is present, without any evidence of aortic stenosis.  7. The inferior vena cava is dilated in size with <50% respiratory variability, suggesting right atrial pressure of 15 mmHg. FINDINGS  Left Ventricle: Left ventricular ejection fraction, by estimation, is 50 to 55%. The left ventricle has low normal function. The left ventricle has no regional wall motion abnormalities. The left ventricular internal cavity size was normal in size. There is moderate concentric left ventricular hypertrophy. Left ventricular diastolic parameters are indeterminate. Right Ventricle: The right ventricular size is normal. Right ventricular systolic function is moderately reduced. There is mildly elevated pulmonary artery systolic pressure. The tricuspid regurgitant velocity is 2.47 m/s, and with an assumed right atrial pressure of 15 mmHg, the estimated right ventricular systolic pressure is 39.4 mmHg. Left Atrium: Left atrial size was mildly dilated. Right Atrium: Right atrial size was mildly dilated. Pericardium: There is no evidence of pericardial effusion. Mitral Valve: The mitral valve is grossly normal. Trivial mitral valve regurgitation. Tricuspid Valve: The tricuspid valve is normal in structure. Tricuspid valve regurgitation is mild. Aortic Valve: Aortic valve  regurgitation is trivial. Aortic valve sclerosis/calcification is present, without any evidence of aortic stenosis. Pulmonic Valve: The pulmonic valve was normal in structure. Pulmonic valve regurgitation is mild. Aorta: The aortic root and ascending aorta are structurally normal, with no evidence of dilitation. Venous: The inferior vena cava is dilated in size with less than 50% respiratory variability, suggesting right atrial  Physician Discharge Summary   Patient: Edward [name] MRN: [id number] DOB: 06/28/1940  Admit date:     10/26/2022  Discharge date: 10/28/22  Discharge Physician: Jacquelin Hawking, MD   [facility]P: [facility]   Recommendations at discharge:  Low sodium and fluid restricted diet PCP follow-up Continue diuretic therapy  Discharge Diagnoses: Principal Problem:   Anasarca Active Problems:   Cirrhosis of liver (HCC)   PAF (paroxysmal atrial fibrillation) (HCC)   Diabetes mellitus (HCC)   CKD (chronic kidney disease) stage 4, GFR 15-29 ml/min (HCC)   Benign essential hypertension   Coronary artery disease   Acute on chronic combined systolic and diastolic CHF (congestive heart failure) (HCC)  Resolved Problems:   * No resolved hospital problems. *  Hospital Course: WEBSTER PATRONE is a 82 y.o. male with a history of chronic diastolic heart failure, CKD stage IV, CAD s/p CABG, diabetes mellitus, peripheral neuropathy, carotid artery stenosis, GERD, GAVE, gastritis.  Patient presented secondary to fluid buildup and found to have evidence of anasarca with mild acute heart failure. Patient managed with Lasix IV and paracentesis with improvement in symptoms.  Assessment and Plan:  Anasarca Secondary to underlying cirrhosis and dietary indiscretion. Patient managed with Lasix IV with improvement.  Acute on chronic combined systolic and diastolic heart failure Transthoracic Echocardiogram this admission is significant for a mildly reduced LVEF of 50-55% in addition to evidence of reduced RVEF. Symptoms improved with Lasix IV. Resolved. Resume home Lasix on discharge.  NASH cirrhosis with ascites Noted. Patient underwent paracentesis on 10/12 yielding 3.3 L of hazy yellow fluid with no labs sent. Continue Lasix on discharge.  CKD stage IV Prior baseline appears to be around 3.25. Creatinine of 2.79 on admission with improvement to 2.65 on day of discharge. Continue Lasix on discharge.  Follow-up with nephrologist.  Paroxysmal atrial fibrillation Rate controlled. Continue Coreg. Not on anticoagulation secondary to history of GI bleeding.  Diabetes mellitus type 2 Controlled with last hemoglobin A1C of 5.0%. Continue home insulin and semaglutide  Hypothyroidism -Continue Synthroid  Primary hypertension Continue Imdur  Normocytic anemia Recent baseline around 8. Improved to 9.7 on admission and stable at 9.4 prior to discharge.  Gout Decrease to allopurinol 50 mg every other day secondary to renal function  History of GAVE Noted.  Obesity Estimated body mass index is 32.93 kg/m as calculated from the following:   Height as of this encounter: 5\' 10"  (1.778 m).   Weight as of this encounter: 104.1 kg.     Consultants: None Procedures performed: Paracentesis  Disposition: Home Diet recommendation: Low sodium; fluid restriction   DISCHARGE MEDICATION: Allergies as of 10/28/2022       Reactions   Lipitor [atorvastatin Calcium] Other (See Comments)   Weakness/ pain in legs        Medication List     TAKE these medications    allopurinol 100 MG tablet Commonly known as: ZYLOPRIM Take 0.5 tablets (50 mg total) by mouth every other day. What changed:  medication strength how much to take when to take this   Carboxymethylcellulose Sod PF 0.5 % Soln Place 1 drop into both eyes in the morning, at noon, in the evening, and at bedtime.   carvedilol 12.5 MG tablet Commonly known as: COREG Take 6.25 mg by mouth 2 (two) times daily with a meal.   cyanocobalamin 1000 MCG tablet Take 1,000 mcg by mouth daily.   ferrous gluconate 324 MG tablet Commonly known as: FERGON Take 1 tablet (324

## 2022-11-06 ENCOUNTER — Ambulatory Visit: Payer: Federal, State, Local not specified - PPO | Admitting: Gastroenterology

## 2022-11-27 NOTE — Progress Notes (Unsigned)
11/27/2022 Edward [name] [id number] 01/20/1940  Referring provider: Byrd Hesselbach, PA Primary GI doctor: Dr. Rhea Belton  ASSESSMENT AND PLAN:   Iron deficiency anemia, unspecified iron deficiency anemia type with history of cirrhosis/GAVE Now off eliquis since Sept Last EGD 09/2022 s/p APC Will scheule for repeat EGD with APC in the hospital for GAVE Continue iron Check Cbc today  Cirrhosis of liver with ascites, portal HTN, history of varices Last MELD NA 21 on 10/28/2022 Now with worsening ascites, patient is on 60 mg lasix and 80 mg lasix at night, just added back on spironolactone 25 mg a week ago -Will schedule for diagnostic and therapeutic paracentesis. - no more than 3 L, albumin put in for admin Recheck renal function today, try to increase spironolactone as much as kidney function will tolerate with goal of 100mg  a day Continue coreg for history of varices Last AFP 05/07/2021 <1.8 , last AB [place] 03/2022, pending AFP consider CT Check ammonia level.  -Nutrition and low sodium diet discussed with patient and information given -Continue daily multivitamin -Recommended 30 minutes of aerobic and resistance exercise 3 days/week - patient is requesting [facility] to [facility], [facility]. His Meld is elevated, uncertain he would be a good candidate with comorbid conditions and CKD, may need liver and kidney if he is even a candidate. Will refer for their evaluation.   SOB (shortness of breath) Some abnormal right lower lobe rhonchi Check CXR rule out effusion/hepatic hydrothorax   Patient Care Team: [facility], [facility] as PCP - General Nahser, Deloris Ping, MD as PCP - Cardiology (Cardiology) [facility] Salvia, MD as PCP - Electrophysiology (Cardiology) Chuck Hint, MD (Inactive) as Attending Physician (Vascular Surgery) Alleen Borne, MD as Attending Physician (Cardiothoracic Surgery) [facility] Salvia, MD as Attending  Physician (Cardiology) Daisy Floro, MD as Referring Physician (Internal Medicine)  HISTORY OF PRESENT ILLNESS: 82 y.o. male with a past medical history of CHF EF 55 to 60% moderately reduced RV function 03/21/2022, CKD stage III, CAD, A-fib (prior MAZE and on Eliquis), PVD, diabetes, hypertension, hyperlipidemia, COPD, bladder cancer, cirrhosis (complicated by portal hypertension manifested as ascites), colon polyps and others listed below presents for hospital follow-up.  Patient admitted to the hospital 9/5 through 9/8 for acute on chronic anemia, positive FOBT  EGD March 2023 showed gastritis with biopsies showing slight chronic inflammation and reactive changes, no H. pylori. 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  VCE normal.   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  States he is getting a repeat next week.  He was on eliquis but this was discontinued last visit.  Denies melena, hematochezia.  He is on an iron tablet.  Denies AB pain, nausea, vomiting.   He is on coreg for HTN but also helps with  esophageal varies.  He has had swelling AB and legs, he went from 192 to 240. He has had some SOB.  He is on 60 mg of lasix and 80 mg in the PM. He was just started on spironolactone only 25 mg a day  for a week .  His last BUN 42, Cr 2.65, potassium was 3.2. he is not on spironolactone. He has increased urinary frequency.  His last paracentesis was 10/12, had 3.3 L removed.   He denies blood thinner use.  He denies NSAID use.  He denies ETOH use.   He denies tobacco use.  He denies drug use.    He  reports that he quit smoking about 43 years ago. His smoking use included cigarettes. He started smoking about 75 years ago. He has a 96 pack-year smoking history. He has never used smokeless tobacco. He reports current alcohol use of about 2.0 standard drinks of alcohol per week. He reports that he does not use drugs.  RELEVANT LABS AND IMAGING:  Results          CBC    Component Value Date/Time   WBC 7.9 10/28/2022 0356   RBC 3.43 (L) 10/28/2022 0356   HGB 9.4 (L) 10/28/2022 0356   HCT 31.6 (L) 10/28/2022 0356   PLT 202 10/28/2022 0356   MCV 92.1 10/28/2022 0356   MCV 74.2 (A) 02/15/2015 1345   MCH 27.4 10/28/2022 0356   MCHC 29.7 (L) 10/28/2022 0356   RDW 18.0 (H) 10/28/2022 0356   LYMPHSABS 0.7 10/26/2022 1014   MONOABS 1.1 (H) 10/26/2022 1014   EOSABS 0.4 10/26/2022 1014   BASOSABS 0.1 10/26/2022 1014   Recent Labs    09/20/22 1429 09/20/22 2111 09/21/22 0631 09/21/22 2008 09/22/22 0203 09/22/22 0744 09/22/22 1554 09/23/22 1013 10/26/22 1014 10/28/22 0356  HGB 8.0* 6.5* 6.7* 8.0* 7.4* 7.2* 8.4* 8.3* 9.7* 9.4*    CMP     Component Value Date/Time   NA 136 10/28/2022 0356   NA 140 04/04/2018 1100   K 3.4 (L) 10/28/2022 0356   CL 100 10/28/2022 0356   CO2 27 10/28/2022 0356   GLUCOSE 52 (L) 10/28/2022 0356   BUN 42 (H) 10/28/2022 0356   BUN 35 (H) 04/04/2018 1100   CREATININE 2.65 (H) 10/28/2022 0356   CREATININE 1.76 (H) 02/06/2016 1124   CALCIUM 8.1 (L) 10/28/2022 0356   PROT 7.0 10/26/2022 1014   PROT 7.3 08/02/2017 1649   ALBUMIN 3.0 (L) 10/26/2022 1014   ALBUMIN 4.2 08/02/2017 1649   AST 25 10/26/2022 1014   ALT 12 10/26/2022 1014   ALKPHOS  151 (H) 10/26/2022 1014   BILITOT 1.0 10/26/2022 1014   BILITOT 0.7 08/02/2017 1649   GFRNONAA 24 (L) 10/28/2022 0356   GFRAA 30 (L) 05/03/2018 1128      Latest Ref Rng & Units 10/26/2022   10:14 AM 09/23/2022    3:04 AM 09/22/2022    2:03 AM  Hepatic Function  Total Protein 6.5 - 8.1 g/dL 7.0  6.2  5.9   Albumin 3.5 - 5.0 g/dL 3.0  2.6  2.5   AST 15 - 41 U/L 25  23  22    ALT 0 - 44 U/L 12  17  16    Alk Phosphatase 38 - 126 U/L 151  92  91   Total Bilirubin 0.3 - 1.2 mg/dL 1.0  1.2  1.2       Current Medications:   Current Outpatient Medications (Endocrine & Metabolic):  insulin glargine (LANTUS) 100 UNIT/ML injection, Inject 48 Units into the skin in the morning.   levothyroxine (SYNTHROID) 25 MCG tablet, Take 25 mcg by mouth daily before breakfast.   Semaglutide, 1 MG/DOSE, 2 MG/1.5ML SOPN, Inject 1 mg into the skin every Sunday.   testosterone cypionate (DEPOTESTOSTERONE CYPIONATE) 200 MG/ML injection, Inject 160 mg into the muscle See admin instructions. Inject 160 mg into the muscle every other Sunday  Current Outpatient Medications (Cardiovascular):    carvedilol (COREG) 12.5 MG tablet, Take 6.25 mg by mouth 2 (two) times daily with a meal.   furosemide (LASIX) 20 MG tablet, Take 4 tablets (80 mg total) by mouth 2 (two) times daily.   isosorbide mononitrate (IMDUR) 30 MG 24 hr tablet, Take 30 mg by mouth daily.   Current Outpatient Medications (Analgesics):    allopurinol (ZYLOPRIM) 100 MG tablet, Take 0.5 tablets (50 mg total) by mouth every other day.   TYLENOL 325 MG tablet, Take 325 mg by mouth in the morning.  Current Outpatient Medications (Hematological):    cyanocobalamin 1000 MCG tablet, Take 1,000 mcg by mouth daily.   ferrous gluconate (FERGON) 324 MG tablet, Take 1 tablet (324 mg total) by mouth daily with breakfast.  Current Outpatient Medications (Other):    Ascorbic Acid (VITAMIN C) 1000 MG tablet, Take 500 mg by mouth daily.   Carboxymethylcellulose  Sod PF 0.5 % SOLN, Place 1 drop into both eyes in the morning, at noon, in the evening, and at bedtime.   Cholecalciferol (VITAMIN D3) 50 MCG (2000 UT) TABS, Take 2,000 Units by mouth daily with breakfast.   gabapentin (NEURONTIN) 100 MG capsule, Take 200-300 mg by mouth See admin instructions. Take 200 mg by mouth in the morning and 300 mg at bedtime   magnesium oxide (MAG-OX) 400 (240 Mg) MG tablet, Take 1,000 mg by mouth at bedtime.   Multiple Vitamin (MULTIVITAMIN) capsule, Take 1 capsule by mouth daily.   pantoprazole (PROTONIX) 40 MG tablet, Take 1 tablet (40 mg total) by mouth 2 (two) times daily. (Patient taking differently: Take 40 mg by mouth in the morning and at bedtime.)   sucralfate (CARAFATE) 1 g tablet, Take 1 tablet (1 g total) by mouth 4 (four) times daily -  with meals and at bedtime. Take for 2 weeks and then take BID (Patient taking differently: Take 0.5 g by mouth in the morning and at bedtime.)   Zinc 50 MG TABS, Take 1 tablet (50 mg total) by mouth daily.  Medical History:  Past Medical History:  Diagnosis Date   Acute on chronic diastolic heart failure (HCC) 03/20/2022   Anemia    ?   Carotid artery occlusion    Chronic diastolic CHF (congestive heart failure) (HCC) 04/23/2012   CKD (chronic kidney disease), stage III (HCC)    Claudication (HCC)    Coronary artery disease    s/p CABG 2013   Diabetic neuropathy (HCC)    mostly feet/ legs   Diverticulosis    Dyslipidemia    Exertional shortness of breath    Fracture of one rib, left side, initial encounter for closed fracture 02/01/2021   Gastritis and gastroduodenitis    GERD (gastroesophageal reflux disease)    GI bleed    while on triple anticoag therapy   HTN (hypertension)    Pacemaker    PAF (paroxysmal atrial fibrillation) (HCC)    s/p MAZE at time of CABG   PVD (peripheral vascular disease) (HCC)    R CEA 2011, prior  PTA, left fem-pop 2014   Stroke (HCC)    vs TIA   Tubular adenoma of colon     Type II diabetes mellitus (HCC)    Allergies:  Allergies  Allergen Reactions   Lipitor [Atorvastatin Calcium] Other (See Comments)    Weakness/ pain in legs     Surgical History:  He  has a past surgical history that includes Carotid endarterectomy (Right, 03/2009); Cholecystectomy; Tonsillectomy and adenoidectomy; Knee arthroscopy (Left); MAZE (06/14/2011); Circumcision; Femoral-popliteal Bypass Graft (Left, 03/06/2012); Total knee arthroplasty (Left); Lead revision (11/05/2012); Coronary artery bypass graft (06/14/2011); Joint replacement (Left); Esophagogastroduodenoscopy (egd) with propofol (N/A, 02/17/2013); Balloon dilation (N/A, 02/17/2013); Colonoscopy with propofol (N/A, 02/17/2013); permanent pacemaker insertion (N/A, 06/20/2011); abdominal aortagram (N/A, 01/07/2012); Lead revision (N/A, 11/05/2012); electrophysiology study (N/A, 11/05/2012); Cataract extraction (Bilateral); Esophagogastroduodenoscopy (egd) with propofol (N/A, 03/17/2021); biopsy (03/17/2021); Colonoscopy with propofol (N/A, 03/18/2021); polypectomy (03/18/2021); IR Paracentesis (05/23/2022); Esophagogastroduodenoscopy (N/A, 08/26/2022); Gi radiofrequency ablation (08/26/2022); biopsy (08/26/2022); Givens capsule study (N/A, 08/26/2022); Esophagogastroduodenoscopy (egd) with propofol (N/A, 09/22/2022); and Hot hemostasis (N/A, 09/22/2022). Family History:  His family history includes Colon cancer (age of onset: 58) in his brother; Coronary artery disease in his father and mother; Deep vein thrombosis in his mother; Heart attack in his mother; Heart disease in his brother, father, mother, and sister; Hyperlipidemia in his mother and sister; Hypertension in his father, mother, and sister.  REVIEW OF SYSTEMS  : All other systems reviewed and negative except where noted in the History of Present Illness.  PHYSICAL EXAM: There were no vitals taken for this visit. General :  Alert, well developed male in no acute distress Head:  Normocephalic and  atraumatic. Eyes :  sclerae anicteric,conjunctive pink  Heart:  irregular, irregular, distant heart sounds Pulm:  right lower lung rhonchi and mild anterior wheezing Abdomen:   Soft, Obese AB,  Sluggish bowel sounds. mild tenderness diffuse. Without guarding and Without rebound,+  fluid wave, +  shifting dullness.  Extremities:   With 2-3 + edema and stasis dermatitis. Msk:  Symmetrical without gross deformities. Peripheral pulses intact.  Neurologic: Alert and  oriented x4;  grossly normal neurologically. with asterixis Skin:   without jaundice. no palmar erythema or spider angioma.   Psychiatric:  Demonstrates good judgement and reason without abnormal affect or behaviors.    Doree Albee, PA-C 12:55 PM

## 2022-11-28 ENCOUNTER — Other Ambulatory Visit (INDEPENDENT_AMBULATORY_CARE_PROVIDER_SITE_OTHER): Payer: No Typology Code available for payment source

## 2022-11-28 ENCOUNTER — Encounter: Payer: Self-pay | Admitting: Physician Assistant

## 2022-11-28 ENCOUNTER — Ambulatory Visit (INDEPENDENT_AMBULATORY_CARE_PROVIDER_SITE_OTHER): Payer: No Typology Code available for payment source | Admitting: Physician Assistant

## 2022-11-28 VITALS — BP 132/78 | HR 70 | Ht 70.0 in | Wt 242.8 lb

## 2022-11-28 DIAGNOSIS — K7460 Unspecified cirrhosis of liver: Secondary | ICD-10-CM

## 2022-11-28 DIAGNOSIS — K766 Portal hypertension: Secondary | ICD-10-CM

## 2022-11-28 DIAGNOSIS — R0602 Shortness of breath: Secondary | ICD-10-CM

## 2022-11-28 DIAGNOSIS — D509 Iron deficiency anemia, unspecified: Secondary | ICD-10-CM

## 2022-11-28 DIAGNOSIS — R188 Other ascites: Secondary | ICD-10-CM | POA: Diagnosis not present

## 2022-11-28 DIAGNOSIS — Z8719 Personal history of other diseases of the digestive system: Secondary | ICD-10-CM

## 2022-11-28 DIAGNOSIS — K31819 Angiodysplasia of stomach and duodenum without bleeding: Secondary | ICD-10-CM

## 2022-11-28 LAB — PROTIME-INR
INR: 1.5 {ratio} — ABNORMAL HIGH (ref 0.8–1.0)
Prothrombin Time: 15.4 s — ABNORMAL HIGH (ref 9.6–13.1)

## 2022-11-28 LAB — CBC WITH DIFFERENTIAL/PLATELET
Basophils Absolute: 0.1 10*3/uL (ref 0.0–0.1)
Basophils Relative: 1.4 % (ref 0.0–3.0)
Eosinophils Absolute: 0.5 10*3/uL (ref 0.0–0.7)
Eosinophils Relative: 5.9 % — ABNORMAL HIGH (ref 0.0–5.0)
HCT: 35.6 % — ABNORMAL LOW (ref 39.0–52.0)
Hemoglobin: 11.1 g/dL — ABNORMAL LOW (ref 13.0–17.0)
Lymphocytes Relative: 7.2 % — ABNORMAL LOW (ref 12.0–46.0)
Lymphs Abs: 0.6 10*3/uL — ABNORMAL LOW (ref 0.7–4.0)
MCHC: 31.2 g/dL (ref 30.0–36.0)
MCV: 82.8 fL (ref 78.0–100.0)
Monocytes Absolute: 1.2 10*3/uL — ABNORMAL HIGH (ref 0.1–1.0)
Monocytes Relative: 13.6 % — ABNORMAL HIGH (ref 3.0–12.0)
Neutro Abs: 6.3 10*3/uL (ref 1.4–7.7)
Neutrophils Relative %: 71.9 % (ref 43.0–77.0)
Platelets: 265 10*3/uL (ref 150.0–400.0)
RBC: 4.3 Mil/uL (ref 4.22–5.81)
RDW: 17.6 % — ABNORMAL HIGH (ref 11.5–15.5)
WBC: 8.8 10*3/uL (ref 4.0–10.5)

## 2022-11-28 LAB — COMPREHENSIVE METABOLIC PANEL
ALT: 10 U/L (ref 0–53)
AST: 23 U/L (ref 0–37)
Albumin: 3.3 g/dL — ABNORMAL LOW (ref 3.5–5.2)
Alkaline Phosphatase: 181 U/L — ABNORMAL HIGH (ref 39–117)
BUN: 46 mg/dL — ABNORMAL HIGH (ref 6–23)
CO2: 31 meq/L (ref 19–32)
Calcium: 8.9 mg/dL (ref 8.4–10.5)
Chloride: 100 meq/L (ref 96–112)
Creatinine, Ser: 3.27 mg/dL — ABNORMAL HIGH (ref 0.40–1.50)
GFR: 17.16 mL/min — ABNORMAL LOW (ref >60.00)
Glucose, Bld: 41 mg/dL — CL (ref 70–99)
Potassium: 4.2 meq/L (ref 3.5–5.1)
Sodium: 137 meq/L (ref 135–145)
Total Bilirubin: 1.1 mg/dL (ref 0.2–1.2)
Total Protein: 8.1 g/dL (ref 6.0–8.3)

## 2022-11-28 LAB — AMMONIA: Ammonia: 37 umol/L — ABNORMAL HIGH (ref 11–35)

## 2022-11-28 MED ORDER — ALBUMIN HUMAN 25 % IV SOLN: 12.5000 g | Freq: Once | INTRAVENOUS | Status: DC

## 2022-11-28 NOTE — Patient Instructions (Signed)
Your provider has requested that you go to the basement level for lab work before leaving today. Press "B" on the elevator. The lab is located at the first door on the left as you exit the elevator.  You should have an imaging study every 6 months to monitor for the development of hepatocellular carcinoma (liver cancer). The risk is low, but, if liver cancer is diagnosed early, there are better treatment options. Will get AFP, INR, CBC, and CMET.  Will follow up in 6 months to check labs and evaluate.   I recommend a high-protein, primarily plant-based diet. Avoid red meat. Work to maintain a health weight. Weigh yourself daily- call if you have weight gain of greater than 5 lbs in a 1-2 days, leg swelling, or new swelling in your abdomen. Minimize salt intake- VERY important. Please do not consume more than 2000 mg of sodium every day. Monitor your blood pressure at home.  Stay active. Weight-based exercise for 30 minutes at least 3 days a week is recommended. I recommend that you not drink any alcohol including beer, wine, liquor, and non-alcoholic beer.   You are at increased risk of osteopenia and osteoporosis. You should be screened for these metabolic bone diseases if you have not already had the testing performed.  Cirrhosis Cirrhosis is long-term (chronic) liver injury. The liver is the body's largest internal organ, and it performs many functions. It converts food into energy, removes toxic material from the blood, makes important proteins, and absorbs necessary vitamins from food. In cirrhosis, healthy liver cells are replaced by scar tissue. This prevents blood from flowing through the liver and makes it difficult for the liver to complete its functions. What are the causes? Common causes of this condition are hepatitis C and long-term alcohol abuse. Other causes include: Nonalcoholic fatty liver disease (NAFLD). This happens when fat is deposited in the liver by causes other than  alcohol. Hepatitis B infection. Autoimmune hepatitis. In this condition, the body's defense system (immune system) mistakenly attacks the liver cells, causing inflammation. Diseases that cause blockage of ducts inside the liver. Inherited liver diseases, such as hemochromatosis. This is one of the most common inherited liver diseases. In this disease, deposits of iron collect in the liver and other organs. Reactions to certain long-term medicines, such as amiodarone, a heart medicine. Parasitic infections. These include schistosomiasis, which is caused by a flatworm. Long-term contact to certain toxins. These toxins include certain organic solvents, such as toluene and chloroform. What increases the risk? You are more likely to develop this condition if: You have certain types of viral hepatitis. You abuse alcohol, especially if you are male. You are overweight. You use IV drugs and share needles. You have unprotected sex with someone who has viral hepatitis. What are the signs or symptoms? You may not have any signs and symptoms at first. Symptoms may not develop until the damage to your liver starts to get worse. Early symptoms may include: Weakness and tiredness (fatigue). Changes in sleep patterns or having trouble sleeping. Itchiness. Tenderness in the right-upper part of your abdomen. Weight loss and muscle loss. Nausea. Loss of appetite. Later symptoms may include: Fatigue or weakness that is getting worse. Yellow skin and eyes (jaundice). Buildup of fluid in the abdomen (ascites). You may notice that your clothes are tight around your waist. Weight gain and swelling of the feet and ankles (edema). Trouble breathing. Easy bruising and bleeding. Vomiting blood, or black or bloody stool. Mental confusion. How is this  diagnosed? Your health care provider may suspect cirrhosis based on your symptoms and medical history, especially if you have other medical conditions or a  history of alcohol abuse. Your health care provider will do a physical exam to feel your liver and to check for signs of cirrhosis. Tests may include: Blood tests to check: For hepatitis B or C. Kidney function. Liver function. Imaging tests such as: MRI or CT scan to look for changes seen in advanced cirrhosis. Ultrasound to see if normal liver tissue is being replaced by scar tissue. A procedure in which a long needle is used to take a sample of liver tissue to be checked in a lab (biopsy). Liver biopsy can confirm the diagnosis of cirrhosis. How is this treated? Treatment for this condition depends on how damaged your liver is and what caused the damage. It may include treating the symptoms of cirrhosis, or treating the underlying causes to slow the damage. Treatment may include: Making lifestyle changes, such as: Eating a healthy diet. You may need to work with your health care provider or a dietitian to develop an eating plan. Restricting salt intake. Maintaining a healthy weight. Not abusing drugs or alcohol. Taking medicines to: Treat liver infections or other infections. Control itching. Reduce fluid buildup. Reduce certain blood toxins. Reduce risk of bleeding from enlarged blood vessels in the stomach or esophagus (varices). Liver transplant. In this procedure, a liver from a donor is used to replace your diseased liver. This is done if cirrhosis has caused liver failure. Other treatments and procedures may be done depending on the problems that you get from cirrhosis. Common problems include liver-related kidney failure (hepatorenal syndrome). Follow these instructions at home:  Take medicines only as told by your health care provider. Do not use medicines that are toxic to your liver. Ask your health care provider before taking any new medicines, including over-the-counter medicines such as NSAIDs. Rest as needed. Eat a well-balanced diet. Limit your salt or water intake, if  your health care provider asks you to do this. Do not drink alcohol. This is especially important if you routinely take acetaminophen. Keep all follow-up visits. This is important. Contact a health care provider if you: Have fatigue or weakness that is getting worse. Develop swelling of the hands, feet, or legs, or a buildup of fluid in the abdomen (ascites). Have a fever or chills. Develop loss of appetite. Have nausea or vomiting. Develop jaundice. Develop easy bruising or bleeding. Get help right away if you: Vomit bright red blood or a material that looks like coffee grounds. Have blood in your stools. Notice that your stools appear black and tarry. Become confused. Have chest pain or trouble breathing. These symptoms may represent a serious problem that is an emergency. Do not wait to see if the symptoms will go away. Get medical help right away. Call your local emergency services (911 in the [place] Do not drive yourself to the hospital. Summary Cirrhosis is chronic liver injury. Common causes are hepatitis C and long-term alcohol abuse. Tests used to diagnose cirrhosis include blood tests, imaging tests, and liver biopsy. Treatment for this condition involves treating the underlying cause. Avoid alcohol, drugs, salt, and medicines that may damage your liver. Get help right away if you vomit bright red blood or a material that looks like coffee grounds. This information is not intended to replace advice given to you by your health care provider. Make sure you discuss any questions you have with your health care  provider. Document Revised: 10/15/2019 Document Reviewed: 10/15/2019 Elsevier Patient Education  2022 [employer].  You have been scheduled for an abdominal paracentesis at [facility] radiology (1st floor of hospital) on 11/14 at 2:0. Please arrive at least 30 minutes prior to your appointment time for registration. Should you need to reschedule this appointment for any  reason, please call our office at [phone].

## 2022-11-29 ENCOUNTER — Ambulatory Visit (HOSPITAL_COMMUNITY): Payer: No Typology Code available for payment source

## 2022-11-30 LAB — AFP TUMOR MARKER: AFP-Tumor Marker: 1.2 ng/mL (ref <6.1)

## 2022-12-03 ENCOUNTER — Other Ambulatory Visit: Payer: Self-pay

## 2022-12-03 DIAGNOSIS — R0602 Shortness of breath: Secondary | ICD-10-CM

## 2022-12-03 DIAGNOSIS — K7460 Unspecified cirrhosis of liver: Secondary | ICD-10-CM

## 2022-12-04 ENCOUNTER — Ambulatory Visit (HOSPITAL_COMMUNITY)
Admission: RE | Admit: 2022-12-04 | Discharge: 2022-12-04 | Disposition: A | Discharge status: Home / Self Care | Payer: No Typology Code available for payment source | Source: Ambulatory Visit | Attending: Physician Assistant | Admitting: Physician Assistant

## 2022-12-04 DIAGNOSIS — R188 Other ascites: Secondary | ICD-10-CM | POA: Diagnosis not present

## 2022-12-04 DIAGNOSIS — K7460 Unspecified cirrhosis of liver: Secondary | ICD-10-CM | POA: Diagnosis present

## 2022-12-04 HISTORY — PX: IR PARACENTESIS: IMG2679

## 2022-12-04 LAB — BODY FLUID CELL COUNT WITH DIFFERENTIAL
Eos, Fluid: 0 %
Lymphs, Fluid: 35 %
Monocyte-Macrophage-Serous Fluid: 25 % — ABNORMAL LOW (ref 50–90)
Neutrophil Count, Fluid: 40 % — ABNORMAL HIGH (ref 0–25)
Total Nucleated Cell Count, Fluid: 340 uL (ref 0–1000)

## 2022-12-04 MED ORDER — LIDOCAINE HCL 1 % IJ SOLN
INTRAMUSCULAR | Status: AC
  Filled 2022-12-04: qty 20

## 2022-12-04 MED ORDER — LIDOCAINE HCL 1 % IJ SOLN
20.0000 mL | Freq: Once | INTRAMUSCULAR | Status: AC
  Administered 2022-12-04: 10 mL

## 2022-12-05 LAB — PATHOLOGIST SMEAR REVIEW: Path Review: REACTIVE

## 2022-12-07 ENCOUNTER — Ambulatory Visit (HOSPITAL_COMMUNITY): Payer: No Typology Code available for payment source

## 2022-12-07 NOTE — Progress Notes (Signed)
Addendum: Reviewed and agree with assessment and management plan. Agree with need and plan for hepatology consultation.  It appears patient prefers [facility].  Please make referral (at age 82 liver transplant candidacy likely but hepatology consult nonetheless advised) Everlie Eble, Carie Caddy, MD

## 2022-12-21 ENCOUNTER — Other Ambulatory Visit (HOSPITAL_COMMUNITY): Payer: Self-pay | Admitting: Physician Assistant

## 2022-12-21 DIAGNOSIS — K7290 Hepatic failure, unspecified without coma: Secondary | ICD-10-CM

## 2022-12-25 ENCOUNTER — Ambulatory Visit (HOSPITAL_COMMUNITY)
Admission: RE | Admit: 2022-12-25 | Discharge: 2022-12-25 | Disposition: A | Discharge status: Home / Self Care | Payer: No Typology Code available for payment source | Source: Ambulatory Visit | Attending: Physician Assistant | Admitting: Physician Assistant

## 2022-12-25 DIAGNOSIS — K7469 Other cirrhosis of liver: Secondary | ICD-10-CM | POA: Diagnosis present

## 2022-12-25 DIAGNOSIS — R188 Other ascites: Secondary | ICD-10-CM | POA: Diagnosis not present

## 2022-12-25 DIAGNOSIS — K7290 Hepatic failure, unspecified without coma: Secondary | ICD-10-CM

## 2022-12-25 HISTORY — PX: IR PARACENTESIS: IMG2679

## 2022-12-25 MED ORDER — LIDOCAINE HCL 1 % IJ SOLN
10.0000 mL | Freq: Once | INTRAMUSCULAR | Status: AC
  Administered 2022-12-25: 10 mL via INTRADERMAL

## 2022-12-25 MED ORDER — LIDOCAINE HCL 1 % IJ SOLN
INTRAMUSCULAR | Status: AC
  Filled 2022-12-25: qty 20

## 2022-12-25 NOTE — Procedures (Signed)
PROCEDURE SUMMARY:  Successful US guided paracentesis from right abdomen.  Yielded 6.2 L of clear yellow fluid.  No immediate complications.  Pt tolerated well.   Specimen not sent for labs.  EBL < 2 mL  Mickie Kay, NP 12/25/2022 3:12 PM

## 2023-01-11 ENCOUNTER — Emergency Department (HOSPITAL_BASED_OUTPATIENT_CLINIC_OR_DEPARTMENT_OTHER)
Admission: EM | Admit: 2023-01-11 | Discharge: 2023-01-11 | Disposition: A | Discharge status: Home / Self Care | Payer: Federal, State, Local not specified - PPO | Attending: Emergency Medicine | Admitting: Emergency Medicine

## 2023-01-11 ENCOUNTER — Emergency Department (HOSPITAL_BASED_OUTPATIENT_CLINIC_OR_DEPARTMENT_OTHER): Payer: Federal, State, Local not specified - PPO

## 2023-01-11 ENCOUNTER — Other Ambulatory Visit: Payer: Self-pay

## 2023-01-11 ENCOUNTER — Encounter (HOSPITAL_BASED_OUTPATIENT_CLINIC_OR_DEPARTMENT_OTHER): Payer: Self-pay | Admitting: Emergency Medicine

## 2023-01-11 DIAGNOSIS — R519 Headache, unspecified: Secondary | ICD-10-CM | POA: Insufficient documentation

## 2023-01-11 DIAGNOSIS — S01411A Laceration without foreign body of right cheek and temporomandibular area, initial encounter: Secondary | ICD-10-CM | POA: Diagnosis not present

## 2023-01-11 DIAGNOSIS — W010XXA Fall on same level from slipping, tripping and stumbling without subsequent striking against object, initial encounter: Secondary | ICD-10-CM | POA: Insufficient documentation

## 2023-01-11 DIAGNOSIS — S0992XA Unspecified injury of nose, initial encounter: Secondary | ICD-10-CM | POA: Diagnosis present

## 2023-01-11 DIAGNOSIS — Z794 Long term (current) use of insulin: Secondary | ICD-10-CM | POA: Diagnosis not present

## 2023-01-11 DIAGNOSIS — S022XXA Fracture of nasal bones, initial encounter for closed fracture: Secondary | ICD-10-CM | POA: Diagnosis not present

## 2023-01-11 DIAGNOSIS — R0789 Other chest pain: Secondary | ICD-10-CM | POA: Insufficient documentation

## 2023-01-11 DIAGNOSIS — W19XXXA Unspecified fall, initial encounter: Secondary | ICD-10-CM

## 2023-01-11 DIAGNOSIS — M542 Cervicalgia: Secondary | ICD-10-CM | POA: Diagnosis not present

## 2023-01-11 LAB — CBC WITH DIFFERENTIAL/PLATELET
Abs Immature Granulocytes: 0.02 10*3/uL (ref 0.00–0.07)
Basophils Absolute: 0.1 10*3/uL (ref 0.0–0.1)
Basophils Relative: 1 %
Eosinophils Absolute: 0.4 10*3/uL (ref 0.0–0.5)
Eosinophils Relative: 5 %
HCT: 36.3 % — ABNORMAL LOW (ref 39.0–52.0)
Hemoglobin: 11 g/dL — ABNORMAL LOW (ref 13.0–17.0)
Immature Granulocytes: 0 %
Lymphocytes Relative: 6 %
Lymphs Abs: 0.5 10*3/uL — ABNORMAL LOW (ref 0.7–4.0)
MCH: 24.7 pg — ABNORMAL LOW (ref 26.0–34.0)
MCHC: 30.3 g/dL (ref 30.0–36.0)
MCV: 81.6 fL (ref 80.0–100.0)
Monocytes Absolute: 0.6 10*3/uL (ref 0.1–1.0)
Monocytes Relative: 7 %
Neutro Abs: 6.2 10*3/uL (ref 1.7–7.7)
Neutrophils Relative %: 81 %
Platelets: 146 10*3/uL — ABNORMAL LOW (ref 150–400)
RBC: 4.45 MIL/uL (ref 4.22–5.81)
RDW: 22.1 % — ABNORMAL HIGH (ref 11.5–15.5)
WBC: 7.7 10*3/uL (ref 4.0–10.5)
nRBC: 0 % (ref 0.0–0.2)

## 2023-01-11 LAB — COMPREHENSIVE METABOLIC PANEL
ALT: 10 U/L (ref 0–44)
AST: 22 U/L (ref 15–41)
Albumin: 2.9 g/dL — ABNORMAL LOW (ref 3.5–5.0)
Alkaline Phosphatase: 163 U/L — ABNORMAL HIGH (ref 38–126)
Anion gap: 8 (ref 5–15)
BUN: 62 mg/dL — ABNORMAL HIGH (ref 8–23)
CO2: 27 mmol/L (ref 22–32)
Calcium: 8.3 mg/dL — ABNORMAL LOW (ref 8.9–10.3)
Chloride: 100 mmol/L (ref 98–111)
Creatinine, Ser: 3.35 mg/dL — ABNORMAL HIGH (ref 0.61–1.24)
GFR, Estimated: 18 mL/min — ABNORMAL LOW (ref >60)
Glucose, Bld: 103 mg/dL — ABNORMAL HIGH (ref 70–99)
Potassium: 4.3 mmol/L (ref 3.5–5.1)
Sodium: 135 mmol/L (ref 135–145)
Total Bilirubin: 1.6 mg/dL — ABNORMAL HIGH (ref <1.2)
Total Protein: 7.2 g/dL (ref 6.5–8.1)

## 2023-01-11 LAB — PROTIME-INR
INR: 1.4 — ABNORMAL HIGH (ref 0.8–1.2)
Prothrombin Time: 17.5 s — ABNORMAL HIGH (ref 11.4–15.2)

## 2023-01-11 MED ORDER — OXYCODONE HCL 5 MG PO TABS
5.0000 mg | ORAL_TABLET | Freq: Once | ORAL | Status: AC
  Administered 2023-01-11: 5 mg via ORAL
  Filled 2023-01-11: qty 1

## 2023-01-11 MED ORDER — ACETAMINOPHEN 500 MG PO TABS
1000.0000 mg | ORAL_TABLET | Freq: Once | ORAL | Status: AC
  Administered 2023-01-11: 1000 mg via ORAL
  Filled 2023-01-11: qty 2

## 2023-01-11 MED ORDER — LIDOCAINE-EPINEPHRINE (PF) 2 %-1:200000 IJ SOLN
10.0000 mL | Freq: Once | INTRAMUSCULAR | Status: AC
  Administered 2023-01-11: 10 mL via INTRADERMAL
  Filled 2023-01-11: qty 20

## 2023-01-11 NOTE — ED Notes (Signed)
Patient called out needed to urinate. Urinal offered by Judeth Cornfield, NT. Patient demands to get up and walk to the bathroom. I went in to see patient. Offered urinal. He reports that he has scrotal swelling and needs to stand up. I am unable to get him up at this time due to his injury. R/O head and neck injury. The CT scans are not read at this time. He wants to see the physician. Will notify provider.

## 2023-01-11 NOTE — ED Notes (Signed)
Dr. Adela Lank has cleared patient to walk to the restroom. No collar in place. CT reads are not complete as of yet.

## 2023-01-11 NOTE — ED Triage Notes (Signed)
Pt reports mechanical fall, "face planted" into car wheel. Lac with swelling, bruising, swelling and bleeding noted to RT cheek below eye, bruising to nose. Pt c/o posterior neck pain, RT side Rib pain, LT hand pain and swelling. Denies loc, denies thinners

## 2023-01-11 NOTE — Discharge Instructions (Signed)
Return for redness drainage or if you get a fever.  The sutures that were used are dissolvable that should dissolve between day 3 and day 5.  If they are still there after day 7 then you can gently plucked them out with tweezers.  The area can get wet but not fully immersed underwater.  No scrubbing.  If you really want to clean it you can apply a half-and-half hydrogen peroxide solution with water on a Q-tip.  You can apply an ointment a couple times a day this could be as simple as Vaseline but could also be an antibiotic ointment if you wish.  Once it is healed please try to avoid prolonged sun exposure use sunscreen.  Gells that have silicone antigens have been shown to reduce scarring in some research.

## 2023-01-11 NOTE — ED Notes (Signed)
Pt ambulated to the bathroom and is back in bed.

## 2023-01-11 NOTE — ED Provider Notes (Signed)
[place] EMERGENCY DEPARTMENT AT [facility] Provider Note   CSN: [id number] Arrival date & time: 01/11/23  1126     History  Chief Complaint  Patient presents with   Edward [name] is a 82 y.o. male.  82 yo M with a chief complaint of a fall.  Patient was walking down a ramp at the [facility] and he tripped on a bump at the bottom and landed on his face.  Complaining mostly of headache neck pain and right-sided chest discomfort.  He has a history of cirrhosis feeling his abdomen is distended.  Denies any tenderness denies fevers denies vomiting.  Denies blood thinner use.   Fall       Home Medications Prior to Admission medications   Medication Sig Start Date End Date Taking? Authorizing Provider  allopurinol (ZYLOPRIM) 100 MG tablet Take 0.5 tablets (50 mg total) by mouth every other day. 10/28/22   Narda Bonds, MD  Ascorbic Acid (VITAMIN C) 1000 MG tablet Take 500 mg by mouth daily.    [provider]  carvedilol (COREG) 12.5 MG tablet Take 6.25 mg by mouth 2 (two) times daily with a meal.    [provider]  Cholecalciferol (VITAMIN D3) 50 MCG (2000 UT) TABS Take 2,000 Units by mouth daily with breakfast.    [provider]  cyanocobalamin 1000 MCG tablet Take 1,000 mcg by mouth daily.    [provider]  ferrous gluconate (FERGON) 324 MG tablet Take 1 tablet (324 mg total) by mouth daily with breakfast. 09/23/22 12/22/22  Dahal, Melina Schools, MD  furosemide (LASIX) 20 MG tablet Take 4 tablets (80 mg total) by mouth 2 (two) times daily. 10/28/22   Narda Bonds, MD  gabapentin (NEURONTIN) 100 MG capsule Take 200-300 mg by mouth See admin instructions. Take 200 mg by mouth in the morning and 300 mg at bedtime    [provider]  insulin glargine (LANTUS) 100 UNIT/ML injection Inject 48 Units into the skin in the morning.    [provider]  levothyroxine (SYNTHROID) 25 MCG tablet Take 25 mcg by mouth daily  before breakfast.    [provider]  magnesium oxide (MAG-OX) 400 (240 Mg) MG tablet Take 1,000 mg by mouth at bedtime.    [provider]  Multiple Vitamin (MULTIVITAMIN) capsule Take 1 capsule by mouth daily.    [provider]  pantoprazole (PROTONIX) 40 MG tablet Take 1 tablet (40 mg total) by mouth 2 (two) times daily. Patient taking differently: Take 40 mg by mouth in the morning and at bedtime. 08/28/22 10/27/22  [name], DO  Semaglutide, 1 MG/DOSE, 2 MG/1.5ML SOPN Inject 1 mg into the skin every Sunday.    [provider]  testosterone cypionate (DEPOTESTOSTERONE CYPIONATE) 200 MG/ML injection Inject 160 mg into the muscle See admin instructions. Inject 160 mg into the muscle every other Sunday    [provider]  TYLENOL 325 MG tablet Take 325 mg by mouth in the morning.    [provider]  Zinc 50 MG TABS Take 1 tablet (50 mg total) by mouth daily. 03/25/22   [name], MD      Allergies    Lipitor [atorvastatin calcium]    Review of Systems   Review of Systems  Physical Exam Updated Vital Signs BP 135/75   Pulse (!) 59   Temp 98.1 F (36.7 C) (Oral)   Resp 11   Wt 109.8 kg  SpO2 100%   BMI 34.72 kg/m  Physical Exam Vitals and nursing note reviewed.  Constitutional:      Appearance: He is well-developed.  HENT:     Head: Normocephalic.     Comments: Multiple facial lacerations along the right cheek.  Obvious nasal bone fracture.  Right frontal hematoma.  No obvious intra oral trauma.  Mild diffuse posterior neck pain. Eyes:     Pupils: Pupils are equal, round, and reactive to light.  Neck:     Vascular: No JVD.  Cardiovascular:     Rate and Rhythm: Normal rate and regular rhythm.     Heart sounds: No murmur heard.    No friction rub. No gallop.  Pulmonary:     Effort: No respiratory distress.     Breath sounds: No wheezing.  Abdominal:     General: There is no distension.     Tenderness:  There is no abdominal tenderness. There is no guarding or rebound.  Musculoskeletal:        General: Normal range of motion.     Cervical back: Normal range of motion and neck supple.  Skin:    Coloration: Skin is not pale.     Findings: No rash.  Neurological:     Mental Status: He is alert and oriented to person, place, and time.  Psychiatric:        Behavior: Behavior normal.     ED Results / Procedures / Treatments   Labs (all labs ordered are listed, but only abnormal results are displayed) Labs Reviewed  CBC WITH DIFFERENTIAL/PLATELET - Abnormal; Notable for the following components:      Result Value   Hemoglobin 11.0 (*)    HCT 36.3 (*)    MCH 24.7 (*)    RDW 22.1 (*)    Platelets 146 (*)    Lymphs Abs 0.5 (*)    All other components within normal limits  COMPREHENSIVE METABOLIC PANEL - Abnormal; Notable for the following components:   Glucose, Bld 103 (*)    BUN 62 (*)    Creatinine, Ser 3.35 (*)    Calcium 8.3 (*)    Albumin 2.9 (*)    Alkaline Phosphatase 163 (*)    Total Bilirubin 1.6 (*)    GFR, Estimated 18 (*)    All other components within normal limits  PROTIME-INR - Abnormal; Notable for the following components:   Prothrombin Time 17.5 (*)    INR 1.4 (*)    All other components within normal limits    EKG None  Radiology CT CHEST ABDOMEN PELVIS WO CONTRAST Result Date: 01/11/2023 CLINICAL DATA:  Fall.  Blunt trauma. EXAM: CT CHEST, ABDOMEN AND PELVIS WITHOUT CONTRAST TECHNIQUE: Multidetector CT imaging of the chest, abdomen and pelvis was performed following the standard protocol without IV contrast. RADIATION DOSE REDUCTION: This exam was performed according to the departmental dose-optimization program which includes automated exposure control, adjustment of the mA and/or kV according to patient size and/or use of iterative reconstruction technique. COMPARISON:  CT abdomen/pelvis dated September 25, 2021. CT chest dated April 01, 2015. FINDINGS:  CT CHEST FINDINGS Cardiovascular: Heart is mildly enlarged. No pericardial effusion. Atherosclerotic calcification of the thoracic aorta and coronary arteries. 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 Mediastinum/Nodes: No enlarged mediastinal, hilar, or axillary lymph nodes. Thyroid gland, trachea, and esophagus demonstrate no significant findings. Lungs/Pleura: Small left pleural effusion with left basilar atelectasis. Mild centrilobular emphysema. No focal consolidation. No pneumothorax. Patchy tree-in-bud nodularity within the right  upper lobe and left lower lobe. Musculoskeletal: No acute osseous abnormality. Intact median sternotomy. CT ABDOMEN PELVIS FINDINGS Hepatobiliary: No evidence of acute hepatic injury. Perihepatic ascites. Hepatic surface contour nodularity, compatible with known cirrhosis. No suspicious focal lesion identified, within the limits of an unenhanced exam. Status post cholecystectomy. No biliary dilatation. Pancreas: Unremarkable. Spleen: No evidence of acute splenic injury. Spleen is enlarged measuring 14.7 cm in craniocaudal dimension. Adrenals/Urinary Tract: No adrenal hemorrhage or renal injury identified. There is air within the nondependent aspect of the distended bladder. Stomach/Bowel: Stomach is within normal limits. Small bowel is grossly unremarkable. Diffuse colonic diverticulosis without evidence of acute diverticulitis. No evidence of obstruction. Vascular/Lymphatic: The abdominal aorta is normal in caliber. Atherosclerotic calcifications of the abdominal aorta and its major branches. There are prominent retrocaval and para-aortic nodes including a 1.6 cm retrocaval node (series 2, image 87) and a 1.2 cm para-aortic node (series 2, image 84). Reproductive: Unremarkable. Other: Moderate volume of abdominopelvic ascites, increased compared to the prior exam, measuring simple fluid attenuation. No intraperitoneal free air. There is an upper ventral abdominal wall hernia  containing ascitic fluid. Diffuse body wall anasarca. Musculoskeletal: No acute osseous abnormality. Similar appearance of superior endplate compression deformity of L3 with Schmorl's nodes. Multilevel degenerative changes of the lumbar spine, most pronounced at L3-L4 and L5-S1. IMPRESSION: 1. No acute traumatic findings in the chest, abdomen, or pelvis. 2. Small left pleural effusion. 3. Patchy tree-in-bud nodularity in the right upper lobe and left lower lobe is likely secondary to an infectious/inflammatory etiology. 4. Air within the nondependent aspect of the bladder could relate to recent instrumentation. 5. Cirrhosis with moderate volume abdominopelvic ascites. 6. Colonic diverticulosis without evidence of acute diverticulitis. 7. Prominent enlarged retrocaval and para-aortic lymph nodes are nonspecific and could be reactive. 8. Diffuse body wall anasarca. Aortic Atherosclerosis (ICD10-I70.0). Electronically Signed   By: Hart Robinsons M.D.   On: 01/11/2023 14:08   CT Head Wo Contrast Result Date: 01/11/2023 CLINICAL DATA:  Provided history: Head trauma, minor. Neck trauma. Facial trauma, blunt. Additional history provided: Fall (with facial trauma). Right cheek laceration. Right-sided facial swelling. EXAM: CT HEAD WITHOUT CONTRAST CT MAXILLOFACIAL WITHOUT CONTRAST CT CERVICAL SPINE WITHOUT CONTRAST TECHNIQUE: Multidetector CT imaging of the head, cervical spine, and maxillofacial structures were performed using the standard protocol without intravenous contrast. Multiplanar CT image reconstructions of the cervical spine and maxillofacial structures were also generated. RADIATION DOSE REDUCTION: This exam was performed according to the departmental dose-optimization program which includes automated exposure control, adjustment of the mA and/or kV according to patient size and/or use of iterative reconstruction technique. COMPARISON:  Prior head CT examinations 08/18/2022 and earlier. Cervical spine  radiographs 08/18/2015. FINDINGS: CT HEAD FINDINGS Brain: Mild generalized parenchymal volume loss. 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 5.7 x 4.3 x 2.0 cm extra-axial mass overlying the left parietal lobe, unchanged in size from the prior head CT of 08/18/2022 and consistent with a meningioma. As before, there is mass effect upon the underlying brain parenchyma without appreciable underlying parenchymal edema. No midline shift or ventricular effacement. Partially empty sella turcica. There is no acute intracranial hemorrhage. No demarcated cortical infarct. No extra-axial fluid collection. No midline shift. Vascular: No hyperdense vessel.  Atherosclerotic calcifications. Skull: No calvarial fracture or aggressive osseous lesion. CT MAXILLOFACIAL FINDINGS Osseous: Mildly displaced fracture of the right nasal bone. No acute maxillofacial fracture identified elsewhere. Orbits: No acute orbital finding. Sinuses: Mild mucosal thickening within the bilateral maxillary sinuses. Soft  tissues: Nasal, right facial and right forehead hematomas. CT CERVICAL SPINE FINDINGS Alignment: 4 mm C3-C4 grade 1 anterolisthesis. Skull base and vertebrae: The basion-dental and atlanto-dental intervals are maintained.No evidence of acute fracture to the cervical spine. Facet ankylosis on the right at C2-C3. A degree of osseous fusion is also suspected across the disc space on the right at C2-C3. Soft tissues and spinal canal: No prevertebral fluid or swelling. No visible canal hematoma. Disc levels: Cervical spondylosis with multilevel disc space narrowing, disc bulges/central disc protrusions, posterior disc osteophyte complexes, uncovertebral hypertrophy and facet arthrosis. Disc space narrowing is greatest at C4-C5, C5-C6 and C6-C7 (advanced at these levels). Multilevel spinal canal stenosis most notably as follows. At C3-C4, a central disc protrusion contributes to at least moderate  spinal canal stenosis. At C5-C6 and C6-C7, posterior disc osteophyte complexes contribute to suspected moderate spinal canal stenosis. Multilevel bony neural foraminal narrowing. Multilevel ventral osteophytes. Degenerative changes also present at the C1-C2 articulation. Upper chest: Partially imaged left pleural effusion. No visible pneumothorax. IMPRESSION: CT head: 1. No acute post-traumatic intracranial findings. 2. 5.7 x 4.3 x 2.0 cm meningioma overlying the left parietal lobe, unchanged from the prior head CT of 08/18/2022. 3. 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 4. Mild generalized cerebral atrophy. CT maxillofacial: 1. Mildly displaced fracture of the right nasal bone. 2. Nasal, right facial and right forehead hematomas. 3. Mild mucosal thickening within the bilateral maxillary sinuses. CT cervical spine: 1. No evidence of an acute cervical spine fracture. 2. 4 mm C3-C4 grade 1 anterolisthesis. 3. Cervical spondylosis as described. Multilevel spinal canal stenosis, greatest at C3-C4 (at least moderate), C5-C6 (suspected moderate) and C6-C7 (suspected moderate). Multilevel bony neural foraminal narrowing. Disc space narrowing is advanced at C4-C5, C5-C6 and C6-C7. 4. C2-C3 vertebral ankylosis. 5. Partially imaged left pleural effusion. Electronically Signed   By: Jackey Loge D.O.   On: 01/11/2023 13:35   CT Maxillofacial Wo Contrast Result Date: 01/11/2023 CLINICAL DATA:  Provided history: Head trauma, minor. Neck trauma. Facial trauma, blunt. Additional history provided: Fall (with facial trauma). Right cheek laceration. Right-sided facial swelling. EXAM: CT HEAD WITHOUT CONTRAST CT MAXILLOFACIAL WITHOUT CONTRAST CT CERVICAL SPINE WITHOUT CONTRAST TECHNIQUE: Multidetector CT imaging of the head, cervical spine, and maxillofacial structures were performed using the standard protocol without intravenous contrast. Multiplanar CT image reconstructions of the  cervical spine and maxillofacial structures were also generated. RADIATION DOSE REDUCTION: This exam was performed according to the departmental dose-optimization program which includes automated exposure control, adjustment of the mA and/or kV according to patient size and/or use of iterative reconstruction technique. COMPARISON:  Prior head CT examinations 08/18/2022 and earlier. Cervical spine radiographs 08/18/2015. FINDINGS: CT HEAD FINDINGS Brain: Mild generalized parenchymal volume loss. 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 5.7 x 4.3 x 2.0 cm extra-axial mass overlying the left parietal lobe, unchanged in size from the prior head CT of 08/18/2022 and consistent with a meningioma. As before, there is mass effect upon the underlying brain parenchyma without appreciable underlying parenchymal edema. No midline shift or ventricular effacement. Partially empty sella turcica. There is no acute intracranial hemorrhage. No demarcated cortical infarct. No extra-axial fluid collection. No midline shift. Vascular: No hyperdense vessel.  Atherosclerotic calcifications. Skull: No calvarial fracture or aggressive osseous lesion. CT MAXILLOFACIAL FINDINGS Osseous: Mildly displaced fracture of the right nasal bone. No acute maxillofacial fracture identified elsewhere. Orbits: No acute orbital finding. Sinuses: Mild mucosal thickening within the bilateral maxillary sinuses. Soft  tissues: Nasal, right facial and right forehead hematomas. CT CERVICAL SPINE FINDINGS Alignment: 4 mm C3-C4 grade 1 anterolisthesis. Skull base and vertebrae: The basion-dental and atlanto-dental intervals are maintained.No evidence of acute fracture to the cervical spine. Facet ankylosis on the right at C2-C3. A degree of osseous fusion is also suspected across the disc space on the right at C2-C3. Soft tissues and spinal canal: No prevertebral fluid or swelling. No visible canal hematoma. Disc  levels: Cervical spondylosis with multilevel disc space narrowing, disc bulges/central disc protrusions, posterior disc osteophyte complexes, uncovertebral hypertrophy and facet arthrosis. Disc space narrowing is greatest at C4-C5, C5-C6 and C6-C7 (advanced at these levels). Multilevel spinal canal stenosis most notably as follows. At C3-C4, a central disc protrusion contributes to at least moderate spinal canal stenosis. At C5-C6 and C6-C7, posterior disc osteophyte complexes contribute to suspected moderate spinal canal stenosis. Multilevel bony neural foraminal narrowing. Multilevel ventral osteophytes. Degenerative changes also present at the C1-C2 articulation. Upper chest: Partially imaged left pleural effusion. No visible pneumothorax. IMPRESSION: CT head: 1. No acute post-traumatic intracranial findings. 2. 5.7 x 4.3 x 2.0 cm meningioma overlying the left parietal lobe, unchanged from the prior head CT of 08/18/2022. 3. 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 4. Mild generalized cerebral atrophy. CT maxillofacial: 1. Mildly displaced fracture of the right nasal bone. 2. Nasal, right facial and right forehead hematomas. 3. Mild mucosal thickening within the bilateral maxillary sinuses. CT cervical spine: 1. No evidence of an acute cervical spine fracture. 2. 4 mm C3-C4 grade 1 anterolisthesis. 3. Cervical spondylosis as described. Multilevel spinal canal stenosis, greatest at C3-C4 (at least moderate), C5-C6 (suspected moderate) and C6-C7 (suspected moderate). Multilevel bony neural foraminal narrowing. Disc space narrowing is advanced at C4-C5, C5-C6 and C6-C7. 4. C2-C3 vertebral ankylosis. 5. Partially imaged left pleural effusion. Electronically Signed   By: Jackey Loge D.O.   On: 01/11/2023 13:35   CT Cervical Spine Wo Contrast Result Date: 01/11/2023 CLINICAL DATA:  Provided history: Head trauma, minor. Neck trauma. Facial trauma, blunt. Additional history  provided: Fall (with facial trauma). Right cheek laceration. Right-sided facial swelling. EXAM: CT HEAD WITHOUT CONTRAST CT MAXILLOFACIAL WITHOUT CONTRAST CT CERVICAL SPINE WITHOUT CONTRAST TECHNIQUE: Multidetector CT imaging of the head, cervical spine, and maxillofacial structures were performed using the standard protocol without intravenous contrast. Multiplanar CT image reconstructions of the cervical spine and maxillofacial structures were also generated. RADIATION DOSE REDUCTION: This exam was performed according to the departmental dose-optimization program which includes automated exposure control, adjustment of the mA and/or kV according to patient size and/or use of iterative reconstruction technique. COMPARISON:  Prior head CT examinations 08/18/2022 and earlier. Cervical spine radiographs 08/18/2015. FINDINGS: CT HEAD FINDINGS Brain: Mild generalized parenchymal volume loss. 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 5.7 x 4.3 x 2.0 cm extra-axial mass overlying the left parietal lobe, unchanged in size from the prior head CT of 08/18/2022 and consistent with a meningioma. As before, there is mass effect upon the underlying brain parenchyma without appreciable underlying parenchymal edema. No midline shift or ventricular effacement. Partially empty sella turcica. There is no acute intracranial hemorrhage. No demarcated cortical infarct. No extra-axial fluid collection. No midline shift. Vascular: No hyperdense vessel.  Atherosclerotic calcifications. Skull: No calvarial fracture or aggressive osseous lesion. CT MAXILLOFACIAL FINDINGS Osseous: Mildly displaced fracture of the right nasal bone. No acute maxillofacial fracture identified elsewhere. Orbits: No acute orbital finding. Sinuses: Mild mucosal thickening within the bilateral maxillary sinuses.  Soft tissues: Nasal, right facial and right forehead hematomas. CT CERVICAL SPINE FINDINGS Alignment: 4 mm  C3-C4 grade 1 anterolisthesis. Skull base and vertebrae: The basion-dental and atlanto-dental intervals are maintained.No evidence of acute fracture to the cervical spine. Facet ankylosis on the right at C2-C3. A degree of osseous fusion is also suspected across the disc space on the right at C2-C3. Soft tissues and spinal canal: No prevertebral fluid or swelling. No visible canal hematoma. Disc levels: Cervical spondylosis with multilevel disc space narrowing, disc bulges/central disc protrusions, posterior disc osteophyte complexes, uncovertebral hypertrophy and facet arthrosis. Disc space narrowing is greatest at C4-C5, C5-C6 and C6-C7 (advanced at these levels). Multilevel spinal canal stenosis most notably as follows. At C3-C4, a central disc protrusion contributes to at least moderate spinal canal stenosis. At C5-C6 and C6-C7, posterior disc osteophyte complexes contribute to suspected moderate spinal canal stenosis. Multilevel bony neural foraminal narrowing. Multilevel ventral osteophytes. Degenerative changes also present at the C1-C2 articulation. Upper chest: Partially imaged left pleural effusion. No visible pneumothorax. IMPRESSION: CT head: 1. No acute post-traumatic intracranial findings. 2. 5.7 x 4.3 x 2.0 cm meningioma overlying the left parietal lobe, unchanged from the prior head CT of 08/18/2022. 3. 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 4. Mild generalized cerebral atrophy. CT maxillofacial: 1. Mildly displaced fracture of the right nasal bone. 2. Nasal, right facial and right forehead hematomas. 3. Mild mucosal thickening within the bilateral maxillary sinuses. CT cervical spine: 1. No evidence of an acute cervical spine fracture. 2. 4 mm C3-C4 grade 1 anterolisthesis. 3. Cervical spondylosis as described. Multilevel spinal canal stenosis, greatest at C3-C4 (at least moderate), C5-C6 (suspected moderate) and C6-C7 (suspected moderate). Multilevel bony  neural foraminal narrowing. Disc space narrowing is advanced at C4-C5, C5-C6 and C6-C7. 4. C2-C3 vertebral ankylosis. 5. Partially imaged left pleural effusion. Electronically Signed   By: Jackey Loge D.O.   On: 01/11/2023 13:35   DG Chest Port 1 View Result Date: 01/11/2023 CLINICAL DATA:  Fall.  Chest pain. EXAM: PORTABLE CHEST 1 VIEW COMPARISON:  Chest radiograph dated October 26, 2022. FINDINGS: Stable cardiomediastinal silhouette. Stable left-sided pacemaker in place. Prior median sternotomy and CABG. Small left pleural effusion. No focal consolidation or pneumothorax. No acute osseous abnormality identified. IMPRESSION: Small left pleural effusion with mild left basilar atelectasis. Electronically Signed   By: Hart Robinsons M.D.   On: 01/11/2023 13:17   DG Hand Complete Left Result Date: 01/11/2023 CLINICAL DATA:  Fall.  Left hand pain. EXAM: LEFT HAND - COMPLETE 3+ VIEW COMPARISON:  None Available. FINDINGS: There is no evidence of acute fracture or dislocation. Cystic lucency noted within the scaphoid and lunate. Mild radiocarpal joint space narrowing. Mild diffuse interphalangeal joint space narrowing. Diffuse soft tissue swelling of the wrist and hand. No radiopaque foreign body. Vascular calcifications are noted. IMPRESSION: 1. No acute osseous abnormality. 2. Diffuse nonspecific soft tissue swelling of the wrist and hand. No radiopaque foreign body. Electronically Signed   By: Hart Robinsons M.D.   On: 01/11/2023 13:13    Procedures .Laceration Repair  Date/Time: 01/11/2023 3:06 PM  Performed by: Melene Plan, DO Authorized by: Melene Plan, DO   Consent:    Consent obtained:  Verbal   Consent given by:  Patient   Risks, benefits, and alternatives were discussed: yes     Risks discussed:  Infection, pain, poor cosmetic result and poor wound healing   Alternatives discussed:  No treatment Universal protocol:    Procedure explained  and questions answered to patient or proxy's  satisfaction: yes     Imaging studies available: yes (CT)     Immediately prior to procedure, a time out was called: yes     Patient identity confirmed:  Verbally with patient Anesthesia:    Anesthesia method:  Local infiltration   Local anesthetic:  Lidocaine 2% WITH epi Laceration details:    Location:  Face   Face location:  R cheek   Length (cm):  8.5 Pre-procedure details:    Preparation:  Patient was prepped and draped in usual sterile fashion Exploration:    Hemostasis achieved with:  Epinephrine and direct pressure   Imaging obtained comment:  CT   Imaging outcome: foreign body not noted     Wound exploration: wound explored through full range of motion     Wound extent: no underlying fracture   Treatment:    Area cleansed with:  Chlorhexidine   Amount of cleaning:  Standard   Irrigation solution:  Sterile saline   Irrigation volume:  50   Irrigation method:  Pressure wash   Debridement:  None   Undermining:  None   Scar revision: no   Skin repair:    Repair method:  Sutures   Suture size:  5-0   Suture material:  Fast-absorbing gut   Suture technique:  Simple interrupted   Number of sutures:  8 Approximation:    Approximation:  Close Repair type:    Repair type:  Simple Post-procedure details:    Dressing:  Antibiotic ointment and adhesive bandage   Procedure completion:  Tolerated well, no immediate complications     Medications Ordered in ED Medications  acetaminophen (TYLENOL) tablet 1,000 mg (1,000 mg Oral Given 01/11/23 1211)  oxyCODONE (Oxy IR/ROXICODONE) immediate release tablet 5 mg (5 mg Oral Given 01/11/23 1211)  lidocaine-EPINEPHrine (XYLOCAINE W/EPI) 2 %-1:200000 (PF) injection 10 mL (10 mLs Intradermal Given 01/11/23 1212)    ED Course/ Medical Decision Making/ A&P                                 Medical Decision Making Amount and/or Complexity of Data Reviewed Labs: ordered. Radiology: ordered.  Risk OTC drugs. Prescription drug  management.   82 yo M with a chief complaint of.  Nonsyncopal by history.  Complaining of diffuse pain has a history of cirrhosis.  Will obtain CT head face C-spine chest abdomen pelvis.  Plain film of the left hand independently interpreted by me without fracture.  Plain film of the chest independently interpreted by me without obvious pneumothorax.  CT imaging head C-spine chest abdomen pelvis without obvious significant acute injury.  He did have a right-sided nasal bone fracture.  I discussed this with the patient will give follow-up with ENT.  No acute anemia, no significant electrolyte abnormalities.  INR appears to be at baseline.  Renal function appears to be at baseline.  3:08 PM:  I have discussed the diagnosis/risks/treatment options with the patient and family.  Evaluation and diagnostic testing in the emergency department does not suggest an emergent condition requiring admission or immediate intervention beyond what has been performed at this time.  They will follow up with ENT, PCP. We also discussed returning to the ED immediately if new or worsening sx occur. We discussed the sx which are most concerning (e.g., sudden worsening pain, fever, inability to tolerate by mouth) that necessitate immediate return. Medications administered to the patient  during their visit and any new prescriptions provided to the patient are listed below.  Medications given during this visit Medications  acetaminophen (TYLENOL) tablet 1,000 mg (1,000 mg Oral Given 01/11/23 1211)  oxyCODONE (Oxy IR/ROXICODONE) immediate release tablet 5 mg (5 mg Oral Given 01/11/23 1211)  lidocaine-EPINEPHrine (XYLOCAINE W/EPI) 2 %-1:200000 (PF) injection 10 mL (10 mLs Intradermal Given 01/11/23 1212)     The patient appears reasonably screen and/or stabilized for discharge and I doubt any other medical condition or other [facility] requiring further screening, evaluation, or treatment in the ED at this time prior to discharge.           Final Clinical Impression(s) / ED Diagnoses Final diagnoses:  Fall, initial encounter  Closed fracture of nasal bone, initial encounter    Rx / DC Orders ED Discharge Orders     None         Melene Plan, DO 01/11/23 1508

## 2023-01-11 NOTE — ED Notes (Signed)
Cleaned and dressed patient's face and left index finger. Assisted with patient getting dressed. Saline lock removed and pressure held. Wheelchair to vehicle.

## 2023-01-21 ENCOUNTER — Inpatient Hospital Stay (HOSPITAL_COMMUNITY)
Admission: EM | Admit: 2023-01-21 | Discharge: 2023-01-25 | DRG: 603 | Disposition: A | Discharge status: Home / Self Care | Payer: No Typology Code available for payment source | Attending: Internal Medicine | Admitting: Internal Medicine

## 2023-01-21 ENCOUNTER — Inpatient Hospital Stay (HOSPITAL_COMMUNITY): Payer: No Typology Code available for payment source

## 2023-01-21 ENCOUNTER — Emergency Department (HOSPITAL_COMMUNITY): Payer: No Typology Code available for payment source

## 2023-01-21 ENCOUNTER — Other Ambulatory Visit: Payer: Self-pay

## 2023-01-21 ENCOUNTER — Encounter (HOSPITAL_COMMUNITY): Payer: Self-pay | Admitting: Internal Medicine

## 2023-01-21 DIAGNOSIS — Z87891 Personal history of nicotine dependence: Secondary | ICD-10-CM | POA: Diagnosis not present

## 2023-01-21 DIAGNOSIS — K219 Gastro-esophageal reflux disease without esophagitis: Secondary | ICD-10-CM | POA: Diagnosis present

## 2023-01-21 DIAGNOSIS — M109 Gout, unspecified: Secondary | ICD-10-CM | POA: Diagnosis present

## 2023-01-21 DIAGNOSIS — Z7985 Long-term (current) use of injectable non-insulin antidiabetic drugs: Secondary | ICD-10-CM

## 2023-01-21 DIAGNOSIS — E11649 Type 2 diabetes mellitus with hypoglycemia without coma: Secondary | ICD-10-CM | POA: Diagnosis present

## 2023-01-21 DIAGNOSIS — E1122 Type 2 diabetes mellitus with diabetic chronic kidney disease: Secondary | ICD-10-CM | POA: Diagnosis present

## 2023-01-21 DIAGNOSIS — Z794 Long term (current) use of insulin: Secondary | ICD-10-CM | POA: Diagnosis not present

## 2023-01-21 DIAGNOSIS — E66812 Obesity, class 2: Secondary | ICD-10-CM | POA: Diagnosis present

## 2023-01-21 DIAGNOSIS — R188 Other ascites: Secondary | ICD-10-CM | POA: Diagnosis present

## 2023-01-21 DIAGNOSIS — K7460 Unspecified cirrhosis of liver: Secondary | ICD-10-CM | POA: Diagnosis present

## 2023-01-21 DIAGNOSIS — K7581 Nonalcoholic steatohepatitis (NASH): Secondary | ICD-10-CM | POA: Diagnosis present

## 2023-01-21 DIAGNOSIS — Z951 Presence of aortocoronary bypass graft: Secondary | ICD-10-CM

## 2023-01-21 DIAGNOSIS — Z8249 Family history of ischemic heart disease and other diseases of the circulatory system: Secondary | ICD-10-CM | POA: Diagnosis not present

## 2023-01-21 DIAGNOSIS — E1142 Type 2 diabetes mellitus with diabetic polyneuropathy: Secondary | ICD-10-CM | POA: Diagnosis present

## 2023-01-21 DIAGNOSIS — E119 Type 2 diabetes mellitus without complications: Secondary | ICD-10-CM

## 2023-01-21 DIAGNOSIS — D509 Iron deficiency anemia, unspecified: Secondary | ICD-10-CM | POA: Diagnosis present

## 2023-01-21 DIAGNOSIS — N184 Chronic kidney disease, stage 4 (severe): Secondary | ICD-10-CM | POA: Diagnosis present

## 2023-01-21 DIAGNOSIS — Z79899 Other long term (current) drug therapy: Secondary | ICD-10-CM | POA: Diagnosis not present

## 2023-01-21 DIAGNOSIS — E1151 Type 2 diabetes mellitus with diabetic peripheral angiopathy without gangrene: Secondary | ICD-10-CM | POA: Diagnosis present

## 2023-01-21 DIAGNOSIS — I130 Hypertensive heart and chronic kidney disease with heart failure and stage 1 through stage 4 chronic kidney disease, or unspecified chronic kidney disease: Secondary | ICD-10-CM | POA: Diagnosis present

## 2023-01-21 DIAGNOSIS — L03115 Cellulitis of right lower limb: Secondary | ICD-10-CM | POA: Diagnosis present

## 2023-01-21 DIAGNOSIS — I10 Essential (primary) hypertension: Secondary | ICD-10-CM | POA: Diagnosis present

## 2023-01-21 DIAGNOSIS — I2510 Atherosclerotic heart disease of native coronary artery without angina pectoris: Secondary | ICD-10-CM | POA: Diagnosis present

## 2023-01-21 DIAGNOSIS — K766 Portal hypertension: Secondary | ICD-10-CM | POA: Diagnosis present

## 2023-01-21 DIAGNOSIS — L97909 Non-pressure chronic ulcer of unspecified part of unspecified lower leg with unspecified severity: Secondary | ICD-10-CM | POA: Diagnosis not present

## 2023-01-21 DIAGNOSIS — Z8673 Personal history of transient ischemic attack (TIA), and cerebral infarction without residual deficits: Secondary | ICD-10-CM

## 2023-01-21 DIAGNOSIS — Z888 Allergy status to other drugs, medicaments and biological substances status: Secondary | ICD-10-CM

## 2023-01-21 DIAGNOSIS — Z79890 Hormone replacement therapy (postmenopausal): Secondary | ICD-10-CM | POA: Diagnosis not present

## 2023-01-21 DIAGNOSIS — E785 Hyperlipidemia, unspecified: Secondary | ICD-10-CM | POA: Diagnosis present

## 2023-01-21 DIAGNOSIS — Z6837 Body mass index (BMI) 37.0-37.9, adult: Secondary | ICD-10-CM

## 2023-01-21 DIAGNOSIS — Z950 Presence of cardiac pacemaker: Secondary | ICD-10-CM

## 2023-01-21 DIAGNOSIS — Z800 Family history of malignant neoplasm of digestive organs: Secondary | ICD-10-CM

## 2023-01-21 DIAGNOSIS — I5032 Chronic diastolic (congestive) heart failure: Secondary | ICD-10-CM | POA: Diagnosis present

## 2023-01-21 DIAGNOSIS — E1165 Type 2 diabetes mellitus with hyperglycemia: Secondary | ICD-10-CM

## 2023-01-21 DIAGNOSIS — I480 Paroxysmal atrial fibrillation: Secondary | ICD-10-CM | POA: Diagnosis present

## 2023-01-21 DIAGNOSIS — Z832 Family history of diseases of the blood and blood-forming organs and certain disorders involving the immune mechanism: Secondary | ICD-10-CM

## 2023-01-21 DIAGNOSIS — Z83438 Family history of other disorder of lipoprotein metabolism and other lipidemia: Secondary | ICD-10-CM

## 2023-01-21 DIAGNOSIS — Z96652 Presence of left artificial knee joint: Secondary | ICD-10-CM | POA: Diagnosis present

## 2023-01-21 DIAGNOSIS — I25810 Atherosclerosis of coronary artery bypass graft(s) without angina pectoris: Secondary | ICD-10-CM | POA: Diagnosis not present

## 2023-01-21 DIAGNOSIS — Z860101 Personal history of adenomatous and serrated colon polyps: Secondary | ICD-10-CM

## 2023-01-21 LAB — BODY FLUID CELL COUNT WITH DIFFERENTIAL
Eos, Fluid: 1 %
Lymphs, Fluid: 13 %
Monocyte-Macrophage-Serous Fluid: 59 % (ref 50–90)
Neutrophil Count, Fluid: 27 % — ABNORMAL HIGH (ref 0–25)
Total Nucleated Cell Count, Fluid: 386 uL (ref 0–1000)

## 2023-01-21 LAB — PROTIME-INR
INR: 1.4 — ABNORMAL HIGH (ref 0.8–1.2)
Prothrombin Time: 17.1 s — ABNORMAL HIGH (ref 11.4–15.2)

## 2023-01-21 LAB — CBC WITH DIFFERENTIAL/PLATELET
Abs Immature Granulocytes: 0.05 10*3/uL (ref 0.00–0.07)
Basophils Absolute: 0.1 10*3/uL (ref 0.0–0.1)
Basophils Relative: 1 %
Eosinophils Absolute: 0.3 10*3/uL (ref 0.0–0.5)
Eosinophils Relative: 3 %
HCT: 37.1 % — ABNORMAL LOW (ref 39.0–52.0)
Hemoglobin: 11.2 g/dL — ABNORMAL LOW (ref 13.0–17.0)
Immature Granulocytes: 1 %
Lymphocytes Relative: 6 %
Lymphs Abs: 0.7 10*3/uL (ref 0.7–4.0)
MCH: 25.5 pg — ABNORMAL LOW (ref 26.0–34.0)
MCHC: 30.2 g/dL (ref 30.0–36.0)
MCV: 84.3 fL (ref 80.0–100.0)
Monocytes Absolute: 1.5 10*3/uL — ABNORMAL HIGH (ref 0.1–1.0)
Monocytes Relative: 15 %
Neutro Abs: 7.8 10*3/uL — ABNORMAL HIGH (ref 1.7–7.7)
Neutrophils Relative %: 74 %
Platelet Morphology: NORMAL
Platelets: 219 10*3/uL (ref 150–400)
RBC: 4.4 MIL/uL (ref 4.22–5.81)
RDW: 23.6 % — ABNORMAL HIGH (ref 11.5–15.5)
WBC: 10.4 10*3/uL (ref 4.0–10.5)
nRBC: 0 % (ref 0.0–0.2)

## 2023-01-21 LAB — COMPREHENSIVE METABOLIC PANEL
ALT: 14 U/L (ref 0–44)
AST: 27 U/L (ref 15–41)
Albumin: 2.6 g/dL — ABNORMAL LOW (ref 3.5–5.0)
Alkaline Phosphatase: 132 U/L — ABNORMAL HIGH (ref 38–126)
Anion gap: 10 (ref 5–15)
BUN: 64 mg/dL — ABNORMAL HIGH (ref 8–23)
CO2: 27 mmol/L (ref 22–32)
Calcium: 8.5 mg/dL — ABNORMAL LOW (ref 8.9–10.3)
Chloride: 99 mmol/L (ref 98–111)
Creatinine, Ser: 3.37 mg/dL — ABNORMAL HIGH (ref 0.61–1.24)
GFR, Estimated: 18 mL/min — ABNORMAL LOW (ref >60)
Glucose, Bld: 68 mg/dL — ABNORMAL LOW (ref 70–99)
Potassium: 4.5 mmol/L (ref 3.5–5.1)
Sodium: 136 mmol/L (ref 135–145)
Total Bilirubin: 1.6 mg/dL — ABNORMAL HIGH (ref 0.0–1.2)
Total Protein: 7.5 g/dL (ref 6.5–8.1)

## 2023-01-21 LAB — SEDIMENTATION RATE: Sed Rate: 17 mm/h — ABNORMAL HIGH (ref 0–16)

## 2023-01-21 LAB — C-REACTIVE PROTEIN: CRP: 2.8 mg/dL — ABNORMAL HIGH (ref <1.0)

## 2023-01-21 LAB — GLUCOSE, CAPILLARY
Glucose-Capillary: 56 mg/dL — ABNORMAL LOW (ref 70–99)
Glucose-Capillary: 52 mg/dL — ABNORMAL LOW (ref 70–99)
Glucose-Capillary: 61 mg/dL — ABNORMAL LOW (ref 70–99)
Glucose-Capillary: 119 mg/dL — ABNORMAL HIGH (ref 70–99)
Glucose-Capillary: 70 mg/dL (ref 70–99)

## 2023-01-21 LAB — PREALBUMIN: Prealbumin: 11 mg/dL — ABNORMAL LOW (ref 18–38)

## 2023-01-21 LAB — CBG MONITORING, ED: Glucose-Capillary: 86 mg/dL (ref 70–99)

## 2023-01-21 LAB — LACTIC ACID, PLASMA: Lactic Acid, Venous: 1.8 mmol/L (ref 0.5–1.9)

## 2023-01-21 MED ORDER — ONDANSETRON HCL 4 MG/2ML IJ SOLN
4.0000 mg | Freq: Four times a day (QID) | INTRAMUSCULAR | Status: DC | PRN
Start: 2023-01-21 — End: 2023-01-22

## 2023-01-21 MED ORDER — ACETAMINOPHEN 500 MG PO TABS: 500.0000 mg | ORAL_TABLET | Freq: Four times a day (QID) | ORAL | Status: DC | PRN

## 2023-01-21 MED ORDER — VANCOMYCIN HCL 2000 MG/400ML IV SOLN
2000.0000 mg | Freq: Once | INTRAVENOUS | Status: AC
  Administered 2023-01-21: 2000 mg via INTRAVENOUS
  Filled 2023-01-21: qty 400

## 2023-01-21 MED ORDER — FUROSEMIDE 40 MG PO TABS
80.0000 mg | ORAL_TABLET | Freq: Two times a day (BID) | ORAL | Status: DC
  Administered 2023-01-22 – 2023-01-23 (×4): 80 mg via ORAL
  Filled 2023-01-21 (×5): qty 2

## 2023-01-21 MED ORDER — CARVEDILOL 6.25 MG PO TABS
6.2500 mg | ORAL_TABLET | Freq: Two times a day (BID) | ORAL | Status: DC
  Administered 2023-01-21 – 2023-01-25 (×8): 6.25 mg via ORAL
  Filled 2023-01-21 (×8): qty 1

## 2023-01-21 MED ORDER — GABAPENTIN 100 MG PO CAPS
200.0000 mg | ORAL_CAPSULE | Freq: Every day | ORAL | Status: DC
  Administered 2023-01-22 – 2023-01-25 (×4): 200 mg via ORAL
  Filled 2023-01-21 (×4): qty 2

## 2023-01-21 MED ORDER — METRONIDAZOLE 500 MG PO TABS
500.0000 mg | ORAL_TABLET | Freq: Once | ORAL | Status: AC
  Administered 2023-01-21: 500 mg via ORAL
  Filled 2023-01-21: qty 1

## 2023-01-21 MED ORDER — SODIUM CHLORIDE 0.9 % IV SOLN
2.0000 g | INTRAVENOUS | Status: DC
  Administered 2023-01-22 – 2023-01-25 (×4): 2 g via INTRAVENOUS
  Filled 2023-01-21 (×4): qty 20

## 2023-01-21 MED ORDER — LIDOCAINE HCL 1 % IJ SOLN
INTRAMUSCULAR | Status: AC
  Filled 2023-01-21: qty 20

## 2023-01-21 MED ORDER — ONDANSETRON HCL 4 MG PO TABS: 4.0000 mg | ORAL_TABLET | Freq: Four times a day (QID) | ORAL | Status: DC | PRN

## 2023-01-21 MED ORDER — INFLUENZA VAC A&B SURF ANT ADJ 0.5 ML IM SUSY: 0.5000 mL | PREFILLED_SYRINGE | INTRAMUSCULAR | Status: DC | PRN

## 2023-01-21 MED ORDER — GABAPENTIN 300 MG PO CAPS
300.0000 mg | ORAL_CAPSULE | Freq: Every day | ORAL | Status: DC
  Administered 2023-01-21 – 2023-01-24 (×4): 300 mg via ORAL
  Filled 2023-01-21 (×4): qty 1

## 2023-01-21 MED ORDER — MAGNESIUM OXIDE -MG SUPPLEMENT 400 (240 MG) MG PO TABS
1000.0000 mg | ORAL_TABLET | Freq: Every day | ORAL | Status: DC
  Administered 2023-01-21 – 2023-01-22 (×2): 1000 mg via ORAL
  Filled 2023-01-21 (×2): qty 3

## 2023-01-21 MED ORDER — VANCOMYCIN HCL IN DEXTROSE 1-5 GM/200ML-% IV SOLN: 1000.0000 mg | INTRAVENOUS | Status: DC

## 2023-01-21 MED ORDER — PANTOPRAZOLE SODIUM 40 MG PO TBEC
40.0000 mg | DELAYED_RELEASE_TABLET | Freq: Two times a day (BID) | ORAL | Status: DC
  Administered 2023-01-21 – 2023-01-25 (×8): 40 mg via ORAL
  Filled 2023-01-21 (×8): qty 1

## 2023-01-21 MED ORDER — ALLOPURINOL 300 MG PO TABS
150.0000 mg | ORAL_TABLET | ORAL | Status: DC
  Administered 2023-01-23 – 2023-01-25 (×2): 150 mg via ORAL
  Filled 2023-01-21 (×2): qty 1

## 2023-01-21 MED ORDER — METRONIDAZOLE 500 MG/100ML IV SOLN
500.0000 mg | Freq: Two times a day (BID) | INTRAVENOUS | Status: DC
  Administered 2023-01-21 – 2023-01-23 (×4): 500 mg via INTRAVENOUS
  Filled 2023-01-21 (×4): qty 100

## 2023-01-21 MED ORDER — SODIUM CHLORIDE 0.9 % IV SOLN
2.0000 g | Freq: Once | INTRAVENOUS | Status: AC
  Administered 2023-01-21: 2 g via INTRAVENOUS
  Filled 2023-01-21: qty 20

## 2023-01-21 MED ORDER — VANCOMYCIN HCL IN DEXTROSE 1-5 GM/200ML-% IV SOLN: 1000.0000 mg | Freq: Once | INTRAVENOUS | Status: DC

## 2023-01-21 MED ORDER — LEVOTHYROXINE SODIUM 25 MCG PO TABS
25.0000 ug | ORAL_TABLET | Freq: Every day | ORAL | Status: DC
Start: 2023-01-22 — End: 2023-01-25
  Administered 2023-01-22 – 2023-01-25 (×4): 25 ug via ORAL
  Filled 2023-01-21 (×5): qty 1

## 2023-01-21 MED ORDER — ACETAMINOPHEN 650 MG RE SUPP: 325.0000 mg | RECTAL | Status: DC | PRN

## 2023-01-21 NOTE — ED Triage Notes (Signed)
 Pt arrived via POV. C/o LLE pain and infection- has been taking PO anb w/o improvement. Pt also states they will be needing a paracentesis. Abd is firm and rounded  AOx4

## 2023-01-21 NOTE — ED Provider Triage Note (Signed)
 Emergency Medicine Provider Triage Evaluation Note  Edward [name] , a 83 y.o. male  was evaluated in triage.  Pt complains of leg pain.  Review of Systems  Positive:  Negative:   Physical Exam  BP 137/71 (BP Location: Left Arm)   Pulse 65   Temp 97.7 F (36.5 C) (Oral)   Resp 18   SpO2 100%  Gen:   Awake, no distress   Resp:  Normal effort  MSK:   Moves extremities without difficulty  Other:    Medical Decision Making  Medically screening exam initiated at 9:26 AM.  Appropriate orders placed.  Edward [name] was informed that the remainder of the evaluation will be completed by another provider, this initial triage assessment does not replace that evaluation, and the importance of remaining in the ED until their evaluation is complete.  Patient requesting paracentesis and concerned for right leg wound infection. Leg infection has been present x3 weeks. Provider at [facility] gave patient ABX which is not helping. Provider sent patient to ED for IV ABX.   Edward [name], [place] 01/21/23 [phone]

## 2023-01-21 NOTE — ED Notes (Signed)
 ED TO INPATIENT HANDOFF REPORT  ED Nurse Name and Phone #: Joaquim POUR [phone]  S Name/Age/Gender Edward [name] 83 y.o. male Room/Bed: WA13/WA13  Code Status   Code Status: Full Code  Home/SNF/Other Home Patient oriented to: self, place, time, and situation Is this baseline? Yes   Triage Complete: Triage complete  Chief Complaint Cellulitis of right lower extremity [L03.115]  Triage Note Pt arrived via POV. C/o LLE pain and infection- has been taking PO anb w/o improvement. Pt also states they will be needing a paracentesis. Abd is firm and rounded  AOx4   Allergies Allergies  Allergen Reactions   Lipitor [Atorvastatin Calcium ] Other (See Comments)    Weakness/ pain in legs    Level of Care/Admitting Diagnosis ED Disposition     ED Disposition  Admit   Condition  --   Comment  H[facility] [[id number]]  Level of Care: Med-Surg [16]  May admit patient to [facility] or [facility] if equivalent level of care is available:: No  Covid Evaluation: Asymptomatic - no recent exposure (last 10 days) testing not required  Diagnosis: Cellulitis of right lower extremity [340367]  Admitting Physician: CELINDA ALM LOT [[id number]]  Attending Physician: CELINDA ALM LOT [[id number]]  Certification:: I certify this patient will need inpatient services for at least 2 midnights  Expected Medical Readiness: 01/24/2023          B Medical/Surgery History Past Medical History:  Diagnosis Date   Acute on chronic diastolic heart failure (HCC) 03/20/2022   Anemia    ?   Carotid artery occlusion    Chronic diastolic CHF (congestive heart failure) (HCC) 04/23/2012   CKD (chronic kidney disease), stage III (HCC)    Claudication (HCC)    Coronary artery disease    s/p CABG 2013   Diabetic neuropathy (HCC)    mostly feet/ legs   Diverticulosis    Dyslipidemia    Exertional shortness of breath    Fracture of one rib, left side, initial encounter  for closed fracture 02/01/2021   Gastritis and gastroduodenitis    GERD (gastroesophageal reflux disease)    GI bleed    while on triple anticoag therapy   HTN (hypertension)    Pacemaker    PAF (paroxysmal atrial fibrillation) (HCC)    s/p MAZE at time of CABG   PVD (peripheral vascular disease) (HCC)    R CEA 2011, prior PTA, left fem-pop 2014   Stroke (HCC)    vs TIA   Tubular adenoma of colon    Type II diabetes mellitus (HCC)    Past Surgical History:  Procedure Laterality Date   ABDOMINAL AORTAGRAM N/A 01/07/2012   Procedure: ABDOMINAL EZELLA;  Surgeon: Lonni GORMAN Blade, MD;  Location: [facility] CATH LAB;  Service: Cardiovascular;  Laterality: N/A;   BALLOON DILATION N/A 02/17/2013   Procedure: BALLOON DILATION;  Surgeon: Gordy CHRISTELLA Starch, MD;  Location: [facility] ENDOSCOPY;  Service: Gastroenterology;  Laterality: N/A;   BIOPSY  03/17/2021   Procedure: BIOPSY;  Surgeon: Avram Lupita BRAVO, MD;  Location: [facility] ENDOSCOPY;  Service: Gastroenterology;;   BIOPSY  08/26/2022   Procedure: BIOPSY;  Surgeon: Wilhelmenia Aloha Raddle., MD;  Location: [facility];  Service: Gastroenterology;;   CAROTID ENDARTERECTOMY Right 03/2009   CATARACT EXTRACTION Bilateral    CHOLECYSTECTOMY     CIRCUMCISION     COLONOSCOPY WITH PROPOFOL  N/A 02/17/2013   Procedure: COLONOSCOPY WITH PROPOFOL ;  Surgeon: Gordy CHRISTELLA Starch, MD;  Location: [facility] ENDOSCOPY;  Service:  Gastroenterology;  Laterality: N/A;   COLONOSCOPY WITH PROPOFOL  N/A 03/18/2021   Procedure: COLONOSCOPY WITH PROPOFOL ;  Surgeon: Rollin Dover, MD;  Location: [facility] ENDOSCOPY;  Service: Gastroenterology;  Laterality: N/A;   CORONARY ARTERY BYPASS GRAFT  06/14/2011   Procedure: CORONARY ARTERY BYPASS GRAFTING (CABG);  Surgeon: Dorise MARLA Fellers, MD;  Location: [facility] OR;  Service: Open Heart Surgery;  Laterality: N/A;  Coronary Artery Bypass Graft on pump times six;  utilizing internal mammary artery and right greater saphenous vein harvested endoscopically.x 5 vessels    ELECTROPHYSIOLOGY STUDY N/A 11/05/2012   Procedure: ELECTROPHYSIOLOGY STUDY;  Surgeon: Elspeth JAYSON Sage, MD;  Location: [facility] CATH LAB;  Service: Cardiovascular;  Laterality: N/A;   ESOPHAGOGASTRODUODENOSCOPY N/A 08/26/2022   Procedure: ESOPHAGOGASTRODUODENOSCOPY (EGD);  Surgeon: Wilhelmenia Aloha Raddle., MD;  Location: [facility];  Service: Gastroenterology;  Laterality: N/A;   ESOPHAGOGASTRODUODENOSCOPY (EGD) WITH PROPOFOL  N/A 02/17/2013   Procedure: ESOPHAGOGASTRODUODENOSCOPY (EGD) WITH PROPOFOL ;  Surgeon: Gordy CHRISTELLA Starch, MD;  Location: [facility] ENDOSCOPY;  Service: Gastroenterology;  Laterality: N/A;   ESOPHAGOGASTRODUODENOSCOPY (EGD) WITH PROPOFOL  N/A 03/17/2021   Procedure: ESOPHAGOGASTRODUODENOSCOPY (EGD) WITH PROPOFOL ;  Surgeon: Avram Lupita BRAVO, MD;  Location: [facility] ENDOSCOPY;  Service: Gastroenterology;  Laterality: N/A;   ESOPHAGOGASTRODUODENOSCOPY (EGD) WITH PROPOFOL  N/A 09/22/2022   Procedure: ESOPHAGOGASTRODUODENOSCOPY (EGD) WITH PROPOFOL ;  Surgeon: Wilhelmenia Aloha Raddle., MD;  Location: [facility] ENDOSCOPY;  Service: Gastroenterology;  Laterality: N/A;   FEMORAL-POPLITEAL BYPASS GRAFT Left 03/06/2012   Procedure: BYPASS GRAFT FEMORAL-POPLITEAL ARTERY;  Surgeon: Lonni GORMAN Blade, MD;  Location: [facility] OR;  Service: Vascular;  Laterality: Left;  Left Femoral - Below Knee Popliteal Bypass Graft with Vein and Intraoperative Arteriogram.   GI RADIOFREQUENCY ABLATION  08/26/2022   Procedure: GI RADIOFREQUENCY ABLATION;  Surgeon: Wilhelmenia Aloha Raddle., MD;  Location: [facility];  Service: Gastroenterology;;   GIVENS CAPSULE STUDY N/A 08/26/2022   Procedure: GIVENS CAPSULE STUDY;  Surgeon: Wilhelmenia Aloha Raddle., MD;  Location: [facility] ENDOSCOPY;  Service: Gastroenterology;  Laterality: N/A;   HOT HEMOSTASIS N/A 09/22/2022   Procedure: HOT HEMOSTASIS (ARGON PLASMA COAGULATION/BICAP);  Surgeon: Wilhelmenia Aloha Raddle., MD;  Location: [facility];  Service: Gastroenterology;  Laterality: N/A;   IR PARACENTESIS  05/23/2022    IR PARACENTESIS  12/04/2022   IR PARACENTESIS  12/25/2022   JOINT REPLACEMENT Left    left TKA   KNEE ARTHROSCOPY Left    I had 3 (11/05/2012)   LEAD REVISION  11/05/2012   pacemaker/notes 11/05/2012   LEAD REVISION N/A 11/05/2012   Procedure: LEAD REVISION;  Surgeon: Elspeth JAYSON Sage, MD;  Location: [facility] CATH LAB;  Service: Cardiovascular;  Laterality: N/A;   MAZE  06/14/2011   Procedure: MAZE;  Surgeon: Dorise MARLA Fellers, MD;  Location: [facility] OR;  Service: Open Heart Surgery;  Laterality: N/A;  Ligate left atrial appendage   PERMANENT PACEMAKER INSERTION N/A 06/20/2011   Procedure: PERMANENT PACEMAKER INSERTION;  Surgeon: Elspeth JAYSON Sage, MD;  Location: [facility] CATH LAB;  Service: Cardiovascular;  Laterality: N/A;   POLYPECTOMY  03/18/2021   Procedure: POLYPECTOMY;  Surgeon: Rollin Dover, MD;  Location: [facility] ENDOSCOPY;  Service: Gastroenterology;;   TONSILLECTOMY AND ADENOIDECTOMY     TOTAL KNEE ARTHROPLASTY Left      A IV Location/Drains/Wounds Patient Lines/Drains/Airways Status     Active Line/Drains/Airways     Name Placement date Placement time Site Days   Peripheral IV 01/21/23 20 G 1 Anterior;Distal;Left;Upper Arm 01/21/23  1034  Arm  less than 1   Wound / Incision (Open or Dehisced) 10/27/22 Other (Comment) Elbow  Left;Posterior large, dark. filled with blood 10/27/22  1440  Elbow  86   Wound / Incision (Open or Dehisced) 01/21/23 Venous stasis ulcer Pretibial Right;Anterior 26 cm x 11 cm total area dry crusting skin with intermittent areas of full thickness breakdown 01/21/23  1326  Pretibial  less than 1            Intake/Output Last 24 hours  Intake/Output Summary (Last 24 hours) at 01/21/2023 1918 Last data filed at 01/21/2023 1634 Gross per 24 hour  Intake 537.21 ml  Output --  Net 537.21 ml    Labs/Imaging Results for orders placed or performed during the hospital encounter of 01/21/23 (from the past 48 hours)  CBC with Differential     Status: Abnormal   Collection Time:  01/21/23 10:37 AM  Result Value Ref Range   WBC 10.4 4.0 - 10.5 K/uL   RBC 4.40 4.22 - 5.81 MIL/uL   Hemoglobin 11.2 (L) 13.0 - 17.0 g/dL   HCT 62.8 (L) 60.9 - 47.9 %   MCV 84.3 80.0 - 100.0 fL   MCH 25.5 (L) 26.0 - 34.0 pg   MCHC 30.2 30.0 - 36.0 g/dL   RDW 76.3 (H) 88.4 - 84.4 %   Platelets 219 150 - 400 K/uL   nRBC 0.0 0.0 - 0.2 %   Neutrophils Relative % 74 %   Neutro Abs 7.8 (H) 1.7 - 7.7 K/uL   Lymphocytes Relative 6 %   Lymphs Abs 0.7 0.7 - 4.0 K/uL   Monocytes Relative 15 %   Monocytes Absolute 1.5 (H) 0.1 - 1.0 K/uL   Eosinophils Relative 3 %   Eosinophils Absolute 0.3 0.0 - 0.5 K/uL   Basophils Relative 1 %   Basophils Absolute 0.1 0.0 - 0.1 K/uL   Immature Granulocytes 1 %   Abs Immature Granulocytes 0.05 0.00 - 0.07 K/uL   Ovalocytes PRESENT    Platelet Morphology NORMAL     Comment: Performed at [facility], [address], [place], [place] [address]  Comprehensive metabolic panel     Status: Abnormal   Collection Time: 01/21/23 10:37 AM  Result Value Ref Range   Sodium 136 135 - 145 mmol/L   Potassium 4.5 3.5 - 5.1 mmol/L   Chloride 99 98 - 111 mmol/L   CO2 27 22 - 32 mmol/L   Glucose, Bld 68 (L) 70 - 99 mg/dL    Comment: Glucose reference range applies only to samples taken after fasting for at least 8 hours.   BUN 64 (H) 8 - 23 mg/dL   Creatinine, Ser 6.62 (H) 0.61 - 1.24 mg/dL   Calcium  8.5 (L) 8.9 - 10.3 mg/dL   Total Protein 7.5 6.5 - 8.1 g/dL   Albumin  2.6 (L) 3.5 - 5.0 g/dL   AST 27 15 - 41 U/L   ALT 14 0 - 44 U/L   Alkaline Phosphatase 132 (H) 38 - 126 U/L   Total Bilirubin 1.6 (H) 0.0 - 1.2 mg/dL   GFR, Estimated 18 (L) >60 mL/min    Comment: (NOTE) Calculated using the CKD-EPI Creatinine Equation (2021)    Anion gap 10 5 - 15    Comment: Performed at [facility], [address] [place], [place] [address]  Protime-INR     Status: Abnormal   Collection Time: 01/21/23 10:37 AM  Result Value Ref Range    Prothrombin Time 17.1 (H) 11.4 - 15.2 seconds   INR 1.4 (H) 0.8 - 1.2  Comment: (NOTE) INR goal varies based on device and disease states. Performed at [facility], [address] [place], [place] [address]   Lactic acid, plasma     Status: None   Collection Time: 01/21/23 10:39 AM  Result Value Ref Range   Lactic Acid, Venous 1.8 0.5 - 1.9 mmol/L    Comment: Performed at [facility], [address], [place], [place] [address]  Body fluid cell count with differential     Status: Abnormal   Collection Time: 01/21/23  3:00 PM  Result Value Ref Range   Fluid Type-FCT PERITONEAL     Comment: CORRECTED ON 01/06 AT 1527: PREVIOUSLY REPORTED AS CYTO PERI   Color, Fluid YELLOW YELLOW   Appearance, Fluid CLEAR CLEAR   Total Nucleated Cell Count, Fluid 386 0 - 1,000 cu mm   Neutrophil Count, Fluid 27 (H) 0 - 25 %   Lymphs, Fluid 13 %   Monocyte-Macrophage-Serous Fluid 59 50 - 90 %   Eos, Fluid 1 %   Other Cells, Fluid OTHER CELLS IDENTIFIED AS MESOTHELIAL CELLS %    Comment: Performed at [facility], [address], [place], [place] [address]  CBG monitoring, ED     Status: None   Collection Time: 01/21/23  5:44 PM  Result Value Ref Range   Glucose-Capillary 86 70 - 99 mg/dL    Comment: Glucose reference range applies only to samples taken after fasting for at least 8 hours.   US  Paracentesis Result Date: 01/21/2023 INDICATION: Patient with history of right lower leg cellulitis, cirrhosis, heart failure, chronic kidney disease, coronary artery disease, diabetes, recurrent ascites. Request received for diagnostic and therapeutic paracentesis. EXAM: ULTRASOUND GUIDED DIAGNOSTIC AND THERAPEUTIC PARACENTESIS MEDICATIONS: 8 mL 1% lidocaine  COMPLICATIONS: None immediate. PROCEDURE: Informed written consent was obtained from the patient after a discussion of the risks, benefits and alternatives to treatment. A timeout was performed prior  to the initiation of the procedure. Initial ultrasound scanning demonstrates a large amount of ascites within the right lower abdominal quadrant. The right lower abdomen was prepped and draped in the usual sterile fashion. 1% lidocaine  was used for local anesthesia. Following this, a 19 gauge, 10-cm, Yueh catheter was introduced. An ultrasound image was saved for documentation purposes. The paracentesis was performed. The catheter was removed and a dressing was applied. The patient tolerated the procedure well without immediate post procedural complication. FINDINGS: A total of approximately 5.9 liters of yellow fluid was removed. Samples were sent to the laboratory as requested by the clinical team. IMPRESSION: Successful ultrasound-guided diagnostic and therapeutic paracentesis yielding 5.9 liters of peritoneal fluid. Performed by: Franky Kelsie RIGGERS Electronically Signed   By: Thom Hall M.D.   On: 01/21/2023 16:25   DG Chest 2 View Result Date: 01/21/2023 CLINICAL DATA:  Shortness of breath. EXAM: CHEST - 2 VIEW COMPARISON:  01/11/2023 FINDINGS: The pacer wires are stable. Stable surgical changes bypass surgery with left atrial appendage clip noted. The heart is within normal limits in size for age. The mediastinal and hilar contours are within normal limits. The central vascular congestion without overt pulmonary edema. Left pleural effusion is noted with overlying atelectasis. The bony thorax is intact. IMPRESSION: 1. Central vascular congestion without overt pulmonary edema. 2. Left pleural effusion with overlying atelectasis. Electronically Signed   By: MYRTIS Stammer M.D.   On: 01/21/2023 11:35   DG Tibia/Fibula Right Result Date: 01/21/2023 CLINICAL DATA:  Left lower extremity pain and infection. EXAM: RIGHT TIBIA  AND FIBULA - 2 VIEW COMPARISON:  01/10/2014. FINDINGS: No acute fracture or dislocation. No aggressive osseous lesion. There are degenerative changes of the knee joint in the form of mildly  reduced medial tibio-femoral compartment joint space, tibial spiking and osteophytosis. Ankle mortise appears intact. No focal soft tissue swelling. No radiopaque foreign bodies. IMPRESSION: *No acute osseous abnormality of the right leg. Electronically Signed   By: Ree Molt M.D.   On: 01/21/2023 10:04    Pending Labs Unresulted Labs (From admission, onward)     Start     Ordered   01/22/23 0500  CBC  Tomorrow morning,   R        01/21/23 1230   01/22/23 0500  Comprehensive metabolic panel  Tomorrow morning,   R        01/21/23 1230   01/21/23 1500  Pathologist smear review  Once,   STAT        01/21/23 1500   01/21/23 1230  C-reactive protein  (COPD / Pneumonia / Cellulitis / Lower Extremity Wound)  Add-on,   AD        01/21/23 1230   01/21/23 1230  Prealbumin  (COPD / Pneumonia / Cellulitis / Lower Extremity Wound)  Add-on,   AD        01/21/23 1230   01/21/23 1229  Sedimentation rate  (COPD / Pneumonia / Cellulitis / Lower Extremity Wound)  Add-on,   AD        01/21/23 1230   01/21/23 0930  Blood culture (routine x 2)  BLOOD CULTURE X 2,   R (with STAT occurrences)      01/21/23 0929            Vitals/Pain Today's Vitals   01/21/23 1513 01/21/23 1541 01/21/23 1639 01/21/23 1822  BP: (!) 88/65 103/68  118/78  Pulse:  67  69  Resp:  18  16  Temp:    (!) 97.5 F (36.4 C)  TempSrc:    Oral  SpO2:  99%  100%  Weight:      Height:      PainSc:   0-No pain     Isolation Precautions No active isolations  Medications Medications  acetaminophen  (TYLENOL ) tablet 500 mg (has no administration in time range)    Or  acetaminophen  (TYLENOL ) suppository 325 mg (has no administration in time range)  ondansetron  (ZOFRAN ) tablet 4 mg (has no administration in time range)    Or  ondansetron  (ZOFRAN ) injection 4 mg (has no administration in time range)  cefTRIAXone  (ROCEPHIN ) 2 g in sodium chloride  0.9 % 100 mL IVPB (has no administration in time range)  metroNIDAZOLE   (FLAGYL ) IVPB 500 mg (has no administration in time range)  vancomycin  (VANCOCIN ) IVPB 1000 mg/200 mL premix (has no administration in time range)  cefTRIAXone  (ROCEPHIN ) 2 g in sodium chloride  0.9 % 100 mL IVPB (0 g Intravenous Stopped 01/21/23 1139)  metroNIDAZOLE  (FLAGYL ) tablet 500 mg (500 mg Oral Given 01/21/23 1047)  vancomycin  (VANCOREADY) IVPB 2000 mg/400 mL (0 mg Intravenous Stopped 01/21/23 1634)    Mobility walks with device     Focused Assessments    R Recommendations: See Admitting Provider Note  Report given to:   Additional Notes:

## 2023-01-21 NOTE — ED Provider Notes (Signed)
 [place] [facility] DEPARTMENT AT [facility] Provider Note   CSN: [id number] Arrival date & time: 01/21/23  9090     History  Chief Complaint  Patient presents with   leg infection   Edema    Edward [name] is a 83 y.o. male.  HPI 83 year old male presents with concern for right leg infection.  He has been taking about 2 weeks of antibiotics (doxycycline ) for cellulitis.  There might have been some slight improvement but overall it is unchanged or even a little worse.  He has had some skin peeling and drainage.  No fevers.  It is not particularly painful at rest but only tender to the touch.  He was sent over from the [place] clinic to get IV antibiotics.  Patient has a history of cirrhosis and last had a paracentesis about a month ago.  He has been progressively accumulating fluid in his abdomen and gaining weight.  He denies any abdominal pain.  He has chronic dyspnea from the fluid that is not particularly worse today.  Home Medications Prior to Admission medications   Medication Sig Start Date End Date Taking? Authorizing Provider  doxycycline  (VIBRA -TABS) 100 MG tablet Take by mouth. 01/02/23  Yes [provider]  spironolactone  (ALDACTONE ) 25 MG tablet Take by mouth. 11/21/22  Yes [provider]  allopurinol  (ZYLOPRIM ) 100 MG tablet Take 0.5 tablets (50 mg total) by mouth every other day. 10/28/22   Briana Elgin LABOR, MD  Ascorbic Acid (VITAMIN C) 1000 MG tablet Take 500 mg by mouth daily.    [provider]  carvedilol  (COREG ) 12.5 MG tablet Take 6.25 mg by mouth 2 (two) times daily with a meal.    [provider]  Cholecalciferol  (VITAMIN D3) 50 MCG (2000 UT) TABS Take 2,000 Units by mouth daily with breakfast.    [provider]  cyanocobalamin  1000 MCG tablet Take 1,000 mcg by mouth daily.    [provider]  ferrous gluconate  (FERGON) 324 MG tablet Take 1 tablet (324 mg total) by mouth daily with  breakfast. 09/23/22 12/22/22  Dahal, Chapman, MD  furosemide  (LASIX ) 20 MG tablet Take 4 tablets (80 mg total) by mouth 2 (two) times daily. 10/28/22   Briana Elgin LABOR, MD  gabapentin  (NEURONTIN ) 100 MG capsule Take 200-300 mg by mouth See admin instructions. Take 200 mg by mouth in the morning and 300 mg at bedtime    [provider]  insulin  glargine (LANTUS ) 100 UNIT/ML injection Inject 48 Units into the skin in the morning.    [provider]  levothyroxine  (SYNTHROID ) 25 MCG tablet Take 25 mcg by mouth daily before breakfast.    [provider]  magnesium  oxide (MAG-OX) 400 (240 Mg) MG tablet Take 1,000 mg by mouth at bedtime.    [provider]  Multiple Vitamin (MULTIVITAMIN) capsule Take 1 capsule by mouth daily.    [provider]  pantoprazole  (PROTONIX ) 40 MG tablet Take 1 tablet (40 mg total) by mouth 2 (two) times daily. Patient taking differently: Take 40 mg by mouth in the morning and at bedtime. 08/28/22 10/27/22  Sheikh, Omair Latif, DO  Semaglutide, 1 MG/DOSE, 2 MG/1.5ML SOPN Inject 1 mg into the skin every Sunday.    [provider]  testosterone cypionate (DEPOTESTOSTERONE CYPIONATE) 200 MG/ML injection Inject 160 mg into the muscle See admin instructions. Inject 160 mg into the muscle every other Sunday    [provider]  TYLENOL  325 MG tablet Take  325 mg by mouth in the morning.    [provider]  Zinc  50 MG TABS Take 1 tablet (50 mg total) by mouth daily. 03/25/22   Rosendo Rush, MD      Allergies    Lipitor [atorvastatin calcium ]    Review of Systems   Review of Systems  Constitutional:  Negative for fever.  Respiratory:  Positive for shortness of breath.   Gastrointestinal:  Positive for abdominal distention. Negative for abdominal pain.  Musculoskeletal:  Positive for myalgias.  Skin:  Positive for color change.    Physical Exam Updated Vital Signs BP 137/71 (BP Location: Left Arm)   Pulse 65    Temp 97.7 F (36.5 C) (Oral)   Resp 18   Ht 5' 10 (1.778 m)   Wt 117 kg   SpO2 100%   BMI 37.02 kg/m  Physical Exam Vitals and nursing note reviewed.  Constitutional:      General: He is not in acute distress.    Appearance: He is well-developed. He is not ill-appearing or diaphoretic.  HENT:     Head: Normocephalic and atraumatic.  Cardiovascular:     Rate and Rhythm: Normal rate and regular rhythm.     Pulses:          Posterior tibial pulses are detected w/ Doppler on the right side.     Heart sounds: Normal heart sounds.  Pulmonary:     Effort: Pulmonary effort is normal.     Breath sounds: Wheezing present.  Abdominal:     General: There is distension.     Palpations: Abdomen is soft.     Tenderness: There is no abdominal tenderness.  Musculoskeletal:     Comments: Right lower leg is diffusely warm, erythematous, and tender. Some foul-smelling drainage as well. No crepitus or pain out of proportion.  Skin:    General: Skin is warm and dry.  Neurological:     Mental Status: He is alert.      ED Results / Procedures / Treatments   Labs (all labs ordered are listed, but only abnormal results are displayed) Labs Reviewed  CBC WITH DIFFERENTIAL/PLATELET - Abnormal; Notable for the following components:      Result Value   Hemoglobin 11.2 (*)    HCT 37.1 (*)    MCH 25.5 (*)    RDW 23.6 (*)    Neutro Abs 7.8 (*)    Monocytes Absolute 1.5 (*)    All other components within normal limits  COMPREHENSIVE METABOLIC PANEL - Abnormal; Notable for the following components:   Glucose, Bld 68 (*)    BUN 64 (*)    Creatinine, Ser 3.37 (*)    Calcium  8.5 (*)    Albumin  2.6 (*)    Alkaline Phosphatase 132 (*)    Total Bilirubin 1.6 (*)    GFR, Estimated 18 (*)    All other components within normal limits  PROTIME-INR - Abnormal; Notable for the following components:   Prothrombin Time 17.1 (*)    INR 1.4 (*)    All other components within normal limits  CULTURE,  BLOOD (ROUTINE X 2)  CULTURE, BLOOD (ROUTINE X 2)  LACTIC ACID, PLASMA  SEDIMENTATION RATE  C-REACTIVE PROTEIN  PREALBUMIN    EKG None  Radiology DG Chest 2 View Result Date: 01/21/2023 CLINICAL DATA:  Shortness of breath. EXAM: CHEST - 2 VIEW COMPARISON:  01/11/2023 FINDINGS: The pacer wires are stable. Stable surgical changes bypass surgery with left atrial appendage clip noted.  The heart is within normal limits in size for age. The mediastinal and hilar contours are within normal limits. The central vascular congestion without overt pulmonary edema. Left pleural effusion is noted with overlying atelectasis. The bony thorax is intact. IMPRESSION: 1. Central vascular congestion without overt pulmonary edema. 2. Left pleural effusion with overlying atelectasis. Electronically Signed   By: MYRTIS Stammer M.D.   On: 01/21/2023 11:35   DG Tibia/Fibula Right Result Date: 01/21/2023 CLINICAL DATA:  Left lower extremity pain and infection. EXAM: RIGHT TIBIA AND FIBULA - 2 VIEW COMPARISON:  01/10/2014. FINDINGS: No acute fracture or dislocation. No aggressive osseous lesion. There are degenerative changes of the knee joint in the form of mildly reduced medial tibio-femoral compartment joint space, tibial spiking and osteophytosis. Ankle mortise appears intact. No focal soft tissue swelling. No radiopaque foreign bodies. IMPRESSION: *No acute osseous abnormality of the right leg. Electronically Signed   By: Ree Molt M.D.   On: 01/21/2023 10:04    Procedures Procedures    Medications Ordered in ED Medications  vancomycin  (VANCOREADY) IVPB 2000 mg/400 mL (2,000 mg Intravenous New Bag/Given 01/21/23 1227)  acetaminophen  (TYLENOL ) tablet 500 mg (has no administration in time range)    Or  acetaminophen  (TYLENOL ) suppository 325 mg (has no administration in time range)  ondansetron  (ZOFRAN ) tablet 4 mg (has no administration in time range)    Or  ondansetron  (ZOFRAN ) injection 4 mg (has no  administration in time range)  cefTRIAXone  (ROCEPHIN ) 2 g in sodium chloride  0.9 % 100 mL IVPB (has no administration in time range)  metroNIDAZOLE  (FLAGYL ) IVPB 500 mg (has no administration in time range)  vancomycin  (VANCOCIN ) IVPB 1000 mg/200 mL premix (has no administration in time range)  cefTRIAXone  (ROCEPHIN ) 2 g in sodium chloride  0.9 % 100 mL IVPB (0 g Intravenous Stopped 01/21/23 1139)  metroNIDAZOLE  (FLAGYL ) tablet 500 mg (500 mg Oral Given 01/21/23 1047)    ED Course/ Medical Decision Making/ A&P                                 Medical Decision Making Amount and/or Complexity of Data Reviewed External Data Reviewed: notes.    Details: [facility] Labs: ordered.    Details: High normal WBC.  Normal lactate Radiology: ordered and independent interpretation performed.    Details: No gas on leg xray  Risk Prescription drug management. Decision regarding hospitalization.   Patient presents with right lower leg cellulitis.  Not improving with 2 weeks of doxycycline .  He is not septic or ill-appearing but I think will need IV antibiotics to help resolve this infection.  Appears to be neurovascularly intact.  Low suspicion for deep space infection such as necrotizing fasciitis.  Given IV antibiotics and will need admission.  Discussed with Dr. Celinda.        Final Clinical Impression(s) / ED Diagnoses Final diagnoses:  Cellulitis of right lower extremity    Rx / DC Orders ED Discharge Orders     None         Freddi Hamilton, MD 01/21/23 1316

## 2023-01-21 NOTE — Procedures (Signed)
 Ultrasound-guided diagnostic and therapeutic paracentesis performed yielding 5.9 liters of yellow  fluid. No immediate complications. A portion of the fluid was sent to the lab for preordered studies. EBL < 2 cc.

## 2023-01-21 NOTE — ED Notes (Signed)
 Patient is in [place] getting paracentesis

## 2023-01-21 NOTE — Progress Notes (Signed)
 Pharmacy Antibiotic Note  Edward [name] is a 83 y.o. male admitted on 01/21/2023 with left lower extremity pain and infection. Patient reports taking doxycycline  PTA for infection. Reports some skin peeling and drainage. Pharmacy has been consulted for vancomycin  dosing.  SCr elevated at 3.37 but appears to be fairly consistent with his baseline.  Plan: -Vancomycin  2 g IV x 1 given, follow with vancomycin  1 g IV q48h -Also started on ceftriaxone  + metronidazole  -Continue to follow renal function, cultures and clinical progress for dose adjustments and de-escalation as indicated  Height: 5' 10 (177.8 cm) Weight: 117 kg (258 lb) IBW/kg (Calculated) : 73  Temp (24hrs), Avg:97.7 F (36.5 C), Min:97.7 F (36.5 C), Max:97.7 F (36.5 C)  Recent Labs  Lab 01/21/23 1037 01/21/23 1039  WBC 10.4  --   CREATININE 3.37*  --   LATICACIDVEN  --  1.8    Estimated Creatinine Clearance: 22.4 mL/min (A) (by C-G formula based on SCr of 3.37 mg/dL (H)).    Allergies  Allergen Reactions   Lipitor [Atorvastatin Calcium ] Other (See Comments)    Weakness/ pain in legs    Antimicrobials this admission: Vancomycin  1/6 >> Ceftriaxone  1/6 >> Metronidazole  1/6 >>  Dose adjustments this admission: NA  Microbiology results: 1/6 BCx: pending   Thank you for allowing pharmacy to be a part of this patient's care.  Stefano MARLA Bologna, PharmD, BCPS Clinical Pharmacist 01/21/2023 1:14 PM

## 2023-01-21 NOTE — H&P (Signed)
 History and Physical    Patient: Edward [name] FMW:[id number] DOB: 01-Nov-1940 DOA: 01/21/2023 DOS: the patient was seen and examined on 01/21/2023 PCP: [facility]  Patient coming from: Home  Chief Complaint:  Chief Complaint  Patient presents with   leg infection   Edema   HPI: EISSA BUCHBERGER is a 83 y.o. 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 He denied fever, chills, rhinorrhea, sore throat, wheezing or hemoptysis.  No chest pain, palpitations, diaphoresis, PND, but has some orthopnea and frequent pitting edema of the lower extremities.  Positive abdominal distention, ascites, mild diffuse abdominal pain.  No emesis, diarrhea, constipation, melena or hematochezia.  No flank pain, dysuria, frequency or hematuria.  No polyuria, polydipsia, polyphagia or blurred vision.   Lab work: CBC showed white count 10.4, hemoglobin 11.2 g/dL platelets 780.  PT 17.1 and INR 1.4.  Lactic acid was normal.  CMP showed normal electrolytes after calcium  correction.  Total bilirubin was 1.6, glucose 68, BUN 64 and creatinine 3.37 mg/dL.  Total protein 7.5 and albumin  2.6 g/dL, normal transaminases, alkaline phosphatase was 132 units/L.  Imaging: Right tibia/fibula with no acute osseous abnormality of the right lower extremity.  2 view chest radiograph showing central vascular congestion without overt pulmonary edema.  Left pleural effusion with overlying  atelectasis.  ED course: Initial vital signs were temperature 97.7 F, pulse 65, respiration 18, BP 137/71 mmHg O2 sat 100% on room air.  The patient received ceftriaxone , metronidazole  and vancomycin .  Interventional radiology performed a large-volume paracentesis of almost 6 L.   Review of Systems: As mentioned in the history of present illness. All other systems reviewed and are negative. Past Medical History:  Diagnosis Date   Acute on chronic diastolic heart failure (HCC) 03/20/2022   Anemia    ?   Carotid artery occlusion    Chronic diastolic CHF (congestive heart failure) (HCC) 04/23/2012   CKD (chronic kidney disease), stage III (HCC)    Claudication (HCC)    Coronary artery disease    s/p CABG 2013   Diabetic neuropathy (HCC)    mostly feet/ legs   Diverticulosis    Dyslipidemia    Exertional shortness of breath    Fracture of one rib, left side, initial encounter for closed fracture 02/01/2021   Gastritis and gastroduodenitis    GERD (gastroesophageal reflux disease)    GI bleed    while on triple anticoag therapy   HTN (hypertension)    Pacemaker    PAF (paroxysmal atrial fibrillation) (HCC)    s/p MAZE at time of CABG   PVD (peripheral vascular disease) (HCC)    R CEA 2011, prior PTA, left fem-pop 2014   Stroke (HCC)    vs TIA   Tubular adenoma of colon    Type II diabetes mellitus (HCC)    Past Surgical History:  Procedure Laterality Date   ABDOMINAL AORTAGRAM N/A 01/07/2012   Procedure: ABDOMINAL EZELLA;  Surgeon: Lonni GORMAN Blade, MD;  Location: [facility] CATH LAB;  Service: Cardiovascular;  Laterality: N/A;   BALLOON DILATION N/A  02/17/2013   Procedure: BALLOON DILATION;  Surgeon: Gordy CHRISTELLA Starch, MD;  Location: [facility];  Service: Gastroenterology;  Laterality: N/A;   BIOPSY  03/17/2021   Procedure: BIOPSY;  Surgeon: Avram Lupita BRAVO, MD;  Location: [facility] ENDOSCOPY;  Service: Gastroenterology;;   BIOPSY  08/26/2022   Procedure: BIOPSY;  Surgeon: Wilhelmenia Aloha Raddle., MD;  Location: [facility];  Service: Gastroenterology;;   CAROTID ENDARTERECTOMY Right 03/2009   CATARACT EXTRACTION Bilateral    CHOLECYSTECTOMY     CIRCUMCISION     COLONOSCOPY WITH PROPOFOL  N/A 02/17/2013   Procedure: COLONOSCOPY WITH PROPOFOL ;  Surgeon: Gordy CHRISTELLA Starch, MD;  Location: [facility] ENDOSCOPY;  Service: Gastroenterology;  Laterality: N/A;   COLONOSCOPY WITH PROPOFOL  N/A 03/18/2021   Procedure: COLONOSCOPY WITH PROPOFOL ;  Surgeon: Rollin Dover, MD;  Location[facility] ENDOSCOPY;  Service: Gastroenterology;  Laterality: N/A;   CORONARY ARTERY BYPASS GRAFT  06/14/2011   Procedure: CORONARY ARTERY BYPASS GRAFTING (CABG);  Surgeon: Dorise MARLA Fellers, MD;  Location: [facility] OR;  Service: Open Heart Surgery;  Laterality: N/A;  Coronary Artery Bypass Graft on pump times six;  utilizing internal mammary artery and right greater saphenous vein harvested endoscopically.x 5 vessels   ELECTROPHYSIOLOGY STUDY N/A 11/05/2012   Procedure: ELECTROPHYSIOLOGY STUDY;  Surgeon: Elspeth JAYSON Sage, MD;  Location: [facility] CATH LAB;  Service: Cardiovascular;  Laterality: N/A;   ESOPHAGOGASTRODUODENOSCOPY N/A 08/26/2022   Procedure: ESOPHAGOGASTRODUODENOSCOPY (EGD);  Surgeon: Wilhelmenia Aloha Raddle., MD;  Location: [facility];  Service: Gastroenterology;  Laterality: N/A;   ESOPHAGOGASTRODUODENOSCOPY (EGD) WITH PROPOFOL  N/A 02/17/2013   Procedure: ESOPHAGOGASTRODUODENOSCOPY (EGD) WITH PROPOFOL ;  Surgeon: Gordy CHRISTELLA Starch, MD;  Location: [facility]OS[facility];  Service: Gastroenterology;  Laterality: N/A;   ESOPHAGOGASTRODUODENOSCOPY (EGD) WITH PROPOFOL  N/A 03/17/2021   Procedure: ESOPHAGOGASTRODUODENOSCOPY (EGD) WITH PROPOFOL ;  Surgeon: Avram Lupita BRAVO, MD;  Location: [facility] ENDOSCOPY;  Service: Gastroenterology;  Laterality: N/A;   ESOPHAGOGASTRODUODENOSCOPY (EGD) WITH PROPOFOL  N/A 09/22/2022   Procedure: ESOPHAGOGASTRODUODENOSCOPY (EGD) WITH PROPOFOL ;  Surgeon: Wilhelmenia Aloha Raddle., MD;  Location: [facility] ENDOSCOPY;  Service: Gastroenterology;   Laterality: N/A;   FEMORAL-POPLITEAL BYPASS GRAFT Left 03/06/2012   Procedure: BYPASS GRAFT FEMORAL-POPLITEAL ARTERY;  Surgeon: Lonni GORMAN Blade, MD;  Location: [facility] OR;  Service: Vascular;  Laterality: Left;  Left Femoral - Below Knee Popliteal Bypass Graft with Vein and Intraoperative Arteriogram.   GI RADIOFREQUENCY ABLATION  08/26/2022   Procedure: GI RADIOFREQUENCY ABLATION;  Surgeon: Wilhelmenia Aloha Raddle., MD;  Location: [facility];  Service: Gastroenterology;;   GIVENS CAPSULE STUDY N/A 08/26/2022   Procedure: GIVENS CAPSULE STUDY;  Surgeon: Wilhelmenia Aloha Raddle., MD;  Location: [facility] ENDOSCOPY;  Service: Gastroenterology;  Laterality: N/A;   HOT HEMOSTASIS N/A 09/22/2022   Procedure: HOT HEMOSTASIS (ARGON PLASMA COAGULATION/BICAP);  Surgeon: Wilhelmenia Aloha Raddle., MD;  Location: [facility];  Service: Gastroenterology;  Laterality: N/A;   IR PARACENTESIS  05/23/2022   IR PARACENTESIS  12/04/2022   IR PARACENTESIS  12/25/2022   JOINT REPLACEMENT Left    left TKA   KNEE ARTHROSCOPY Left    I had 3 (11/05/2012)   LEAD REVISION  11/05/2012   pacemaker/notes 11/05/2012   LEAD REVISION N/A 11/05/2012   Procedure: LEAD REVISION;  Surgeon: Elspeth JAYSON Sage, MD;  Location: [facility] CATH LAB;  Service: Cardiovascular;  Laterality: N/A;   MAZE  06/14/2011   Procedure: MAZE;  Surgeon: Dorise MARLA Fellers, MD;  Location: [facility] OR;  Service: Open Heart Surgery;  Laterality: N/A;  Ligate left atrial appendage   PERMANENT PACEMAKER INSERTION N/A 06/20/2011   Procedure:  PERMANENT PACEMAKER INSERTION;  Surgeon: Elspeth JAYSON Sage, MD;  Location: [facility] CATH LAB;  Service: Cardiovascular;  Laterality: N/A;   POLYPECTOMY  03/18/2021   Procedure: POLYPECTOMY;  Surgeon: Rollin Dover, MD;  Location: [facility] ENDOSCOPY;  Service: Gastroenterology;;   TONSILLECTOMY AND ADENOIDECTOMY     TOTAL KNEE ARTHROPLASTY Left    Social History:  reports that he quit smoking about 44 years ago. His smoking use included cigarettes. He started  smoking about 76 years ago. He has a 96 pack-year smoking history. He has never used smokeless tobacco. He reports current alcohol  use of about 2.0 standard drinks of alcohol  per week. He reports that he does not use drugs.  Allergies  Allergen Reactions   Lipitor [Atorvastatin Calcium ] Other (See Comments)    Weakness/ pain in legs    Family History  Problem Relation Age of Onset   Coronary artery disease Father    Hypertension Father    Heart disease Father        Heart Disease before age 2   Heart attack Mother    Coronary artery disease Mother    Deep vein thrombosis Mother    Heart disease Mother        Heart Disease before age 13   Hyperlipidemia Mother    Hypertension Mother    Hypertension Sister    Heart disease Sister        Heart Disease before age 6   Hyperlipidemia Sister    Heart disease Brother        Heart Disease before age 24   Colon cancer Brother 26   Anesthesia problems Neg Hx    Diabetes Neg Hx     Prior to Admission medications   Medication Sig Start Date End Date Taking? Authorizing Provider  allopurinol  (ZYLOPRIM ) 100 MG tablet Take 0.5 tablets (50 mg total) by mouth every other day. 10/28/22   Briana Elgin LABOR, MD  Ascorbic Acid (VITAMIN C) 1000 MG tablet Take 500 mg by mouth daily.    [provider]  carvedilol  (COREG ) 12.5 MG tablet Take 6.25 mg by mouth 2 (two) times daily with a meal.    [provider]  Cholecalciferol  (VITAMIN D3) 50 MCG (2000 UT) TABS Take 2,000 Units by mouth daily with breakfast.    [provider]  cyanocobalamin  1000 MCG tablet Take 1,000 mcg by mouth daily.    [provider]  ferrous gluconate  (FERGON) 324 MG tablet Take 1 tablet (324 mg total) by mouth daily with breakfast. 09/23/22 12/22/22  Dahal, Chapman, MD  furosemide  (LASIX ) 20 MG tablet Take 4 tablets (80 mg total) by mouth 2 (two) times daily. 10/28/22   Briana Elgin LABOR, MD  gabapentin  (NEURONTIN ) 100 MG capsule Take 200-300 mg  by mouth See admin instructions. Take 200 mg by mouth in the morning and 300 mg at bedtime    [provider]  insulin  glargine (LANTUS ) 100 UNIT/ML injection Inject 48 Units into the skin in the morning.    [provider]  levothyroxine  (SYNTHROID ) 25 MCG tablet Take 25 mcg by mouth daily before breakfast.    [provider]  magnesium  oxide (MAG-OX) 400 (240 Mg) MG tablet Take 1,000 mg by mouth at bedtime.    [provider]  Multiple Vitamin (MULTIVITAMIN) capsule Take 1 capsule by mouth daily.    [provider]  pantoprazole  (PROTONIX ) 40 MG tablet Take 1 tablet (40 mg total) by mouth 2 (two) times daily. Patient taking differently: Take 40  mg by mouth in the morning and at bedtime. 08/28/22 10/27/22  Sheikh, Omair Latif, DO  Semaglutide, 1 MG/DOSE, 2 MG/1.5ML SOPN Inject 1 mg into the skin every Sunday.    [provider]  testosterone cypionate (DEPOTESTOSTERONE CYPIONATE) 200 MG/ML injection Inject 160 mg into the muscle See admin instructions. Inject 160 mg into the muscle every other Sunday    [provider]  TYLENOL  325 MG tablet Take 325 mg by mouth in the morning.    [provider]  Zinc  50 MG TABS Take 1 tablet (50 mg total) by mouth daily. 03/25/22   Rosendo Rush, MD    Physical Exam: Vitals:   01/21/23 0916 01/21/23 0936  BP: 137/71   Pulse: 65   Resp: 18   Temp: 97.7 F (36.5 C)   TempSrc: Oral   SpO2: 100%   Weight:  117 kg  Height:  5' 10 (1.778 m)   Physical Exam Vitals and nursing note reviewed.  Constitutional:      General: He is awake. He is not in acute distress.    Appearance: Normal appearance. He is obese. He is ill-appearing.  HENT:     Head: Normocephalic.     Nose: No rhinorrhea.     Mouth/Throat:     Mouth: Mucous membranes are moist.  Eyes:     General: No scleral icterus.    Pupils: Pupils are equal, round, and reactive to light.  Neck:     Vascular: No JVD.   Cardiovascular:     Rate and Rhythm: Normal rate and regular rhythm.     Heart sounds: S1 normal and S2 normal.  Pulmonary:     Effort: Pulmonary effort is normal.     Breath sounds: Normal breath sounds. No wheezing, rhonchi or rales.  Abdominal:     General: Bowel sounds are normal. There is distension.     Palpations: Abdomen is soft. There is fluid wave.     Tenderness: There is abdominal tenderness.  Musculoskeletal:     Cervical back: Neck supple.     Right lower leg: No edema.     Left lower leg: No edema.  Skin:    General: Skin is warm and dry.     Findings: Erythema and lesion present.     Comments: Left lower extremity cellulitis with erythema, edema, calor, TTP and extensive layer of crusted scab.  See picture below.  Neurological:     General: No focal deficit present.     Mental Status: He is alert and oriented to person, place, and time.  Psychiatric:        Mood and Affect: Mood normal.        Behavior: Behavior normal. Behavior is cooperative.     Data Reviewed:  Results are pending, will review when available.  10/27/2022 echocardiogram report. IMPRESSIONS:   1. Left ventricular ejection fraction, by estimation, is 50 to 55%. The  left ventricle has low normal function. The left ventricle has no regional  wall motion abnormalities. There is moderate concentric left ventricular  hypertrophy. Left ventricular  diastolic parameters are indeterminate.   2. Right ventricular systolic function is moderately reduced. The right  ventricular size is normal. There is mildly elevated pulmonary artery  systolic pressure. The estimated right ventricular systolic pressure is  39.4 mmHg.   3. Left atrial size was mildly dilated.   4. Right atrial size was mildly dilated.   5. The mitral valve is grossly normal. Trivial mitral valve  regurgitation.   6. Aortic valve regurgitation is trivial. Aortic valve  sclerosis/calcification is present, without any evidence of  aortic  stenosis.   7. The inferior vena cava is dilated in size with <50% respiratory  variability, suggesting right atrial pressure of 15 mmHg.   Assessment and Plan: Principal Problem:   Cellulitis of right lower extremity Admit to MedSurg/inpatient. Continue IV fluids. Continue ceftriaxone  2 g IVPB every 24 hours.   Continue metronidazole  500 mg IVPB q 12 hr. Continue vancomycin  per pharmacy. Follow-up blood culture and sensitivity Follow CBC and CMP in a.m.  Active Problems:   Cirrhosis of liver (HCC) Status post large-volume paracentesis. Would benefit from regular paracentesis schedule.    Diabetes mellitus (HCC) Was hypoglycemic earlier today. -Has not eaten much today. -Hold long-acting insulin . Carbohydrate modified diet. CBG monitoring with meals/bedtime. Add on RI SS as needed. Check hemoglobin A1c.    PAF (paroxysmal atrial fibrillation) (HCC) Not on anticoagulation. Continue carvedilol  for rate control.    Coronary artery disease Continue beta-blocker. Will not resume statin given liver disease.    Chronic diastolic CHF (congestive heart failure) (HCC) Continue carvedilol  12.5 mg p.o. twice daily.   Continue furosemide  80 mg p.o. twice daily.    CKD (chronic kidney disease) stage 4, GFR 15-29 ml/min (HCC) Monitor renal function electrolytes.    Benign essential hypertension Continue antihypertensives as above.    Iron  deficiency anemia Monitor hematocrit and hemoglobin. Transfuse PRBC as needed.    Gout Continue allopurinol  150 mg 3 times a week.      Advance Care Planning:   Code Status: Full Code   Consults:   Family Communication: His spouse was at bedside.  Severity of Illness: The appropriate patient status for this patient is INPATIENT. Inpatient status is judged to be reasonable and necessary in order to provide the required intensity of service to ensure the patient's safety. The patient's presenting symptoms, physical exam  findings, and initial radiographic and laboratory data in the context of their chronic comorbidities is felt to place them at high risk for further clinical deterioration. Furthermore, it is not anticipated that the patient will be medically stable for discharge from the hospital within 2 midnights of admission.   * I certify that at the point of admission it is my clinical judgment that the patient will require inpatient hospital care spanning beyond 2 midnights from the point of admission due to high intensity of service, high risk for further deterioration and high frequency of surveillance required.*  Author: Alm Dorn Castor, MD 01/21/2023 12:02 PM  For on call review [email or web address].   This document was prepared using Dragon voice recognition software and may contain some unintended transcription errors.

## 2023-01-21 NOTE — Consult Note (Signed)
 WOC Nurse Consult Note: patient with history of CHF and PVD presenting to ED with R leg edema, erythema; states he went to walk in clinic at [place] 2 weeks ago and was placed on by mouth Doxycycline  with no improvement; patient and significant other deny any history of any wound care orders.  They say he has a history of B leg edema with intermittent wounds to R lower leg (never the left) but has never had any kind of wound care or been seen at a wound care center. Patient says as his swelling in his leg improves so do the wounds.   Reason for Consult: lower extremity wound  Wound type: full thickness r/t ? Venous insufficiency  Pressure Injury POA: NA  Measurement: 26 cm x 11 cm total area scaling crusted skin; has areas of full thickness breakdown in between large areas of crusting skin some are 100% red, some 100% yellow slough  Wound bed: as above  Drainage (amount, consistency, odor) some purulent drainage noted mostly to superior aspect of wound  Periwound: erythema, edema  Dressing procedure/placement/frequency: Cleanse R lower leg wound with Vashe wound cleanser Soila [phone]), cover anterior and posterior leg with Xeroform gauze Soila [phone]), cover with ABD pads and wrap with Kerlix beginning right above toes and ending right below knee.  May secure with Ace bandage wrapped in same fashion as Kerlix for light compression.    Patient and wife state he has compression hose at home but can not wear them when leg begins to swell.   POC discussed with patient, wife and bedside nurse. Patient refused to allow WOC nurse to touch legs or provide any care at time of visit stating legs were too tender.  Orders placed for bedside nurse to perform when patient is willing to allow.    WOC team will not follow at this time. Re-consult if further needs arise.   Thank you,    Powell Bar MSN, RN-BC, [employer] [phone]

## 2023-01-22 ENCOUNTER — Inpatient Hospital Stay (HOSPITAL_COMMUNITY): Payer: No Typology Code available for payment source

## 2023-01-22 DIAGNOSIS — L97909 Non-pressure chronic ulcer of unspecified part of unspecified lower leg with unspecified severity: Secondary | ICD-10-CM

## 2023-01-22 DIAGNOSIS — L03115 Cellulitis of right lower limb: Secondary | ICD-10-CM | POA: Diagnosis not present

## 2023-01-22 LAB — CBC
HCT: 36.4 % — ABNORMAL LOW (ref 39.0–52.0)
Hemoglobin: 10.6 g/dL — ABNORMAL LOW (ref 13.0–17.0)
MCH: 24.9 pg — ABNORMAL LOW (ref 26.0–34.0)
MCHC: 29.1 g/dL — ABNORMAL LOW (ref 30.0–36.0)
MCV: 85.4 fL (ref 80.0–100.0)
Platelets: 212 10*3/uL (ref 150–400)
RBC: 4.26 MIL/uL (ref 4.22–5.81)
RDW: 23.6 % — ABNORMAL HIGH (ref 11.5–15.5)
WBC: 9.2 10*3/uL (ref 4.0–10.5)
nRBC: 0 % (ref 0.0–0.2)

## 2023-01-22 LAB — COMPREHENSIVE METABOLIC PANEL
ALT: 12 U/L (ref 0–44)
AST: 23 U/L (ref 15–41)
Albumin: 2.2 g/dL — ABNORMAL LOW (ref 3.5–5.0)
Alkaline Phosphatase: 109 U/L (ref 38–126)
Anion gap: 9 (ref 5–15)
BUN: 64 mg/dL — ABNORMAL HIGH (ref 8–23)
CO2: 26 mmol/L (ref 22–32)
Calcium: 7.8 mg/dL — ABNORMAL LOW (ref 8.9–10.3)
Chloride: 97 mmol/L — ABNORMAL LOW (ref 98–111)
Creatinine, Ser: 3.31 mg/dL — ABNORMAL HIGH (ref 0.61–1.24)
GFR, Estimated: 18 mL/min — ABNORMAL LOW (ref >60)
Glucose, Bld: 77 mg/dL (ref 70–99)
Potassium: 4.3 mmol/L (ref 3.5–5.1)
Sodium: 132 mmol/L — ABNORMAL LOW (ref 135–145)
Total Bilirubin: 1.6 mg/dL — ABNORMAL HIGH (ref 0.0–1.2)
Total Protein: 6.4 g/dL — ABNORMAL LOW (ref 6.5–8.1)

## 2023-01-22 LAB — GLUCOSE, CAPILLARY
Glucose-Capillary: 65 mg/dL — ABNORMAL LOW (ref 70–99)
Glucose-Capillary: 136 mg/dL — ABNORMAL HIGH (ref 70–99)
Glucose-Capillary: 171 mg/dL — ABNORMAL HIGH (ref 70–99)
Glucose-Capillary: 138 mg/dL — ABNORMAL HIGH (ref 70–99)

## 2023-01-22 LAB — VAS US ABI WITH/WO TBI: Left ABI: 0.95

## 2023-01-22 MED ORDER — ADULT MULTIVITAMIN W/MINERALS CH
1.0000 | ORAL_TABLET | Freq: Every day | ORAL | Status: DC
  Administered 2023-01-22 – 2023-01-25 (×4): 1 via ORAL
  Filled 2023-01-22 (×4): qty 1

## 2023-01-22 MED ORDER — B COMPLEX-C PO TABS
1.0000 | ORAL_TABLET | Freq: Every day | ORAL | Status: DC
  Administered 2023-01-22 – 2023-01-25 (×4): 1 via ORAL
  Filled 2023-01-22 (×4): qty 1

## 2023-01-22 MED ORDER — TRAZODONE HCL 50 MG PO TABS
50.0000 mg | ORAL_TABLET | Freq: Every evening | ORAL | Status: DC | PRN
  Administered 2023-01-23 – 2023-01-24 (×2): 50 mg via ORAL
  Filled 2023-01-22 (×2): qty 1

## 2023-01-22 MED ORDER — ONDANSETRON HCL 4 MG/2ML IJ SOLN: 4.0000 mg | Freq: Four times a day (QID) | INTRAMUSCULAR | Status: DC | PRN

## 2023-01-22 MED ORDER — IPRATROPIUM-ALBUTEROL 0.5-2.5 (3) MG/3ML IN SOLN
3.0000 mL | RESPIRATORY_TRACT | Status: DC | PRN
  Administered 2023-01-23: 3 mL via RESPIRATORY_TRACT
  Filled 2023-01-22: qty 3

## 2023-01-22 MED ORDER — ALBUMIN HUMAN 25 % IV SOLN
25.0000 g | Freq: Four times a day (QID) | INTRAVENOUS | Status: AC
  Administered 2023-01-22: 12.5 g via INTRAVENOUS
  Administered 2023-01-22: 25 g via INTRAVENOUS
  Administered 2023-01-22: 12.5 g via INTRAVENOUS
  Administered 2023-01-23: 25 g via INTRAVENOUS
  Filled 2023-01-22 (×4): qty 100

## 2023-01-22 MED ORDER — METOPROLOL TARTRATE 5 MG/5ML IV SOLN: 5.0000 mg | INTRAVENOUS | Status: DC | PRN

## 2023-01-22 MED ORDER — GUAIFENESIN 100 MG/5ML PO LIQD: 5.0000 mL | ORAL | Status: DC | PRN

## 2023-01-22 MED ORDER — HYDROMORPHONE HCL 1 MG/ML IJ SOLN
1.0000 mg | INTRAMUSCULAR | Status: DC | PRN
  Administered 2023-01-22 – 2023-01-25 (×4): 1 mg via INTRAVENOUS
  Filled 2023-01-22 (×4): qty 1

## 2023-01-22 MED ORDER — HYDRALAZINE HCL 20 MG/ML IJ SOLN: 10.0000 mg | INTRAMUSCULAR | Status: DC | PRN

## 2023-01-22 MED ORDER — SENNOSIDES-DOCUSATE SODIUM 8.6-50 MG PO TABS: 1.0000 | ORAL_TABLET | Freq: Every evening | ORAL | Status: DC | PRN

## 2023-01-22 NOTE — Progress Notes (Signed)
 PROGRESS NOTE    Edward [name]  FMW:[id number] DOB: 1940-09-06 DOA: 01/21/2023 PCP: [facility]    Brief Narrative:  83 year old with history of chronic diastolic CHF, CAD status post CABG, chronic carotid artery occlusion, CKD stage IV, DM 2, PVD, peripheral neuropathy, HLD, HTN, TIA, P A-fib with pacemaker in place holding anticoagulation due to history of GAVE, GERD, gastritis, to the ER with complaints of left lower extremity erythema and pain despite of being on oral doxycycline  as outpatient.  Also noted to have abdominal distention.  Upon admission noted to have some signs of volume overload and started on IV antibiotics for possible cellulitis.  Patient underwent paracentesis on 1/6, 6 L was removed.   Assessment & Plan:  Principal Problem:   Cellulitis of right lower extremity Active Problems:   Cirrhosis of liver (HCC)   PAF (paroxysmal atrial fibrillation) (HCC)   Diabetes mellitus (HCC)   Chronic diastolic CHF (congestive heart failure) (HCC)   CKD (chronic kidney disease) stage 4, GFR 15-29 ml/min (HCC)   Benign essential hypertension   Coronary artery disease   Iron  deficiency anemia   Gout    Cellulitis of right lower extremity with chronic skin changes Patient has not done well on outpatient p.o. doxycycline , failed treatment.  Currently on broad-spectrum IV antibiotics.  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  Supportive care, monitor. Will schedule albumin  Follows outpatient [facility]   Diarrhea mellitus type II Peripheral neuropathy Slight scale and Accu-Chek.  Will resume home medications as appropriate Continue gabapentin      PAF (paroxysmal atrial fibrillation) (HCC) Not on anticoagulation.  Prior history of GI bleed Continue  carvedilol  for rate control.     Coronary artery disease status post CABG   Chronic diastolic CHF (congestive heart failure) (HCC) Peripheral vascular disease Currently chest pain-free. Continue carvedilol  12.5 mg p.o. twice daily.   Continue furosemide  80 mg p.o. twice daily.     CKD (chronic kidney disease) stage 4, GFR 15-29 ml/min (HCC) Monitor renal function electrolytes.     Benign essential hypertension Continue antihypertensives as above.  IV as needed     Iron  deficiency anemia Monitor hematocrit and hemoglobin. Transfuse PRBC as needed.     Gout Continue allopurinol  150 mg 3 times a week.       DVT prophylaxis: SCDs Start: 01/21/23 1228      Code Status: Full Code Family Communication:   Status is: Inpatient Remains inpatient appropriate because: Continue hospital stay for further management for right lower extremity cellulitis    Subjective: Seen at bedside, reporting of right lower extremity cellulitis and erythema which she has been struggling for the past several weeks and failed oral antibiotics.  Last night wound care team attempted dressing changes at his request but patient is declining due to pain.    Examination:  General exam: Appears calm and comfortable  Respiratory system: Clear to auscultation. Respiratory effort normal. Cardiovascular system: S1 & S2 heard, RRR. No JVD, murmurs, rubs, gallops or clicks. No pedal edema. Gastrointestinal system: Abdomen is nondistended, soft and nontender. No organomegaly or masses felt. Normal bowel sounds heard. Central nervous system: Alert and oriented. No focal neurological deficits. Extremities: Symmetric 5 x 5 power. Skin: Right lower extremity erythema and flaky skin Psychiatry: Judgement and insight appear normal.  Mood & affect appropriate.                Diet Orders (From admission, onward)     Start     Ordered   01/21/23 1228  Diet heart healthy/carb modified Room service  appropriate? Yes; Fluid consistency: Thin; Fluid restriction: 1200 mL Fluid  Diet effective now       Question Answer Comment  Diet-HS Snack? Nothing   Room service appropriate? Yes   Fluid consistency: Thin   Fluid restriction: 1200 mL Fluid      01/21/23 1230            Objective: Vitals:   01/21/23 2232 01/22/23 0207 01/22/23 0416 01/22/23 0846  BP: (!) 111/59 124/68 113/63 (!) 101/55  Pulse: 73 68 64 66  Resp: 20 20 18 16   Temp: 97.6 F (36.4 C) (!) 97.5 F (36.4 C) (!) 97.5 F (36.4 C) (!) 97.4 F (36.3 C)  TempSrc: Oral Oral  Oral  SpO2: 100% 97%  99%  Weight:      Height:        Intake/Output Summary (Last 24 hours) at 01/22/2023 1217 Last data filed at 01/22/2023 0900 Gross per 24 hour  Intake 877.21 ml  Output --  Net 877.21 ml   Filed Weights   01/21/23 0936  Weight: 117 kg    Scheduled Meds:  [START ON 01/23/2023] allopurinol   150 mg Oral Q M,W,F   B-complex with vitamin C  1 tablet Oral Daily   carvedilol   6.25 mg Oral BID   furosemide   80 mg Oral BID   gabapentin   200 mg Oral Daily   gabapentin   300 mg Oral QHS   levothyroxine   25 mcg Oral Q0600   magnesium  oxide  1,000 mg Oral QHS   multivitamin with minerals  1 tablet Oral Daily   pantoprazole   40 mg Oral BID   Continuous Infusions:  albumin  human 12.5 g (01/22/23 1150)   cefTRIAXone  (ROCEPHIN )  IV 2 g (01/22/23 1152)   metronidazole  500 mg (01/22/23 0834)   [START ON 01/23/2023] vancomycin       Nutritional status     Body mass index is 37.02 kg/m.  Data Reviewed:   CBC: Recent Labs  Lab 01/21/23 1037 01/22/23 0434  WBC 10.4 9.2  NEUTROABS 7.8*  --   HGB 11.2* 10.6*  HCT 37.1* 36.4*  MCV 84.3 85.4  PLT 219 212   Basic Metabolic Panel: Recent Labs  Lab 01/21/23 1037 01/22/23 0434  NA 136 132*  K 4.5 4.3  CL 99 97*  CO2 27 26  GLUCOSE 68* 77  BUN 64* 64*  CREATININE 3.37* 3.31*  CALCIUM  8.5* 7.8*   GFR: Estimated Creatinine Clearance: 22.8 mL/min (A) (by C-G  formula based on SCr of 3.31 mg/dL (H)). Liver Function Tests: Recent Labs  Lab 01/21/23 1037 01/22/23 0434  AST 27 23  ALT 14 12  ALKPHOS 132* 109  BILITOT 1.6* 1.6*  PROT 7.5 6.4*  ALBUMIN  2.6* 2.2*   No results for input(s): LIPASE, AMYLASE in the last 168 hours. No results for input(s): AMMONIA in the last 168 hours. Coagulation Profile: Recent Labs  Lab 01/21/23 1037  INR 1.4*   Cardiac Enzymes: No results for input(s): CKTOTAL, CKMB, CKMBINDEX, TROPONINI in the last 168 hours. BNP (last 3 results) No results for input(s): PROBNP in the last 8760 hours. HbA1C: No results for input(s): HGBA1C in the last 72 hours. CBG: Recent Labs  Lab 01/21/23 2114 01/21/23 2132  01/21/23 2220 01/22/23 0731 01/22/23 1132  GLUCAP 61* 70 119* 65* 136*   Lipid Profile: No results for input(s): CHOL, HDL, LDLCALC, TRIG, CHOLHDL, LDLDIRECT in the last 72 hours. Thyroid  Function Tests: No results for input(s): TSH, T4TOTAL, FREET4, T3FREE, THYROIDAB in the last 72 hours. Anemia Panel: No results for input(s): VITAMINB12, FOLATE, FERRITIN, TIBC, IRON , RETICCTPCT in the last 72 hours. Sepsis Labs: Recent Labs  Lab 01/21/23 1039  LATICACIDVEN 1.8    Recent Results (from the past 240 hours)  Blood culture (routine x 2)     Status: None (Preliminary result)   Collection Time: 01/21/23 10:47 AM   Specimen: BLOOD  Result Value Ref Range Status   Specimen Description   Final    BLOOD LEFT ANTECUBITAL Performed at [facility], [address], [place], [place] [address]    Special Requests   Final    BOTTLES DRAWN AEROBIC AND ANAEROBIC Blood Culture results may not be optimal due to an inadequate volume of blood received in culture bottles Performed at [facility], [address] [place], [place] [address]    Culture   Final    NO GROWTH < 24 HOURS Performed at [facility],  [address] [place], [place] [address]    Report Status PENDING  Incomplete  Blood culture (routine x 2)     Status: None (Preliminary result)   Collection Time: 01/21/23 10:48 AM   Specimen: BLOOD  Result Value Ref Range Status   Specimen Description   Final    BLOOD LEFT ANTECUBITAL Performed at [facility], [address] [place], [place] [address]    Special Requests   Final    BOTTLES DRAWN AEROBIC AND ANAEROBIC Blood Culture results may not be optimal due to an inadequate volume of blood received in culture bottles Performed at [facility], [address] [place], [place] [address]    Culture   Final    NO GROWTH < 24 HOURS Performed at [facility], [address], [place], [place] [address]    Report Status PENDING  Incomplete         Radiology Studies: VAS US  ABI WITH/WO TBI Result Date: 01/22/2023  LOWER EXTREMITY DOPPLER STUDY Patient Name:  Edward [name]  Date of Exam:   01/22/2023 Medical Rec #: [id number]        Accession #:    [id number] Date of Birth: March 08, 1940        Patient Gender: M Patient Age:   86 years Exam Location:  [facility] Procedure:      VAS US  ABI WITH/WO TBI Referring Phys: DAVID ORTIZ --------------------------------------------------------------------------------  Indications: RLE wound/cellulitis High Risk Factors: Hypertension, hyperlipidemia, Diabetes, past history of                    smoking, coronary artery disease, prior CVA. Other Factors: CHF, CKD, Afib, PM, PVD, HX of CABG,.  Vascular Interventions: 03/06/2012 LLE CFA to BK pop bypass graft. Limitations: Today's exam was limited due to patient intolerant to cuff pressure              and an open wound. Comparison Study: Previous exam on 04/19/2022 Performing Technologist: Leigh Rom RVT, RDMS  Examination Guidelines: A complete evaluation includes at minimum, Doppler waveform signals and systolic blood pressure reading at the level of bilateral  brachial, anterior tibial, and posterior tibial arteries, when vessel segments are accessible. Bilateral testing  is considered an integral part of a complete examination. Photoelectric Plethysmograph (PPG) waveforms and toe systolic pressure readings are included as required and additional duplex testing as needed. Limited examinations for reoccurring indications may be performed as noted.  ABI Findings: +---------+------------------+-----+---------+--------+ Right    Rt Pressure (mmHg)IndexWaveform Comment  +---------+------------------+-----+---------+--------+ Brachial 136                    triphasic         +---------+------------------+-----+---------+--------+ PTA                             biphasic        +---------+------------------+-----+---------+--------+ DP                              biphasic        +---------+------------------+-----+---------+--------+ Great Toe88                0.65 Abnormal          +---------+------------------+-----+---------+--------+ +---------+------------------+-----+---------+-------+ Left     Lt Pressure (mmHg)IndexWaveform Comment +---------+------------------+-----+---------+-------+ Brachial 129                    triphasic        +---------+------------------+-----+---------+-------+ PTA      128               0.94 biphasic         +---------+------------------+-----+---------+-------+ DP       129               0.95 biphasic         +---------+------------------+-----+---------+-------+ Great Toe90                0.66 Normal           +---------+------------------+-----+---------+-------+ +-------+-----------+-----------+------------+------------+ ABI/TBIToday's ABIToday's TBIPrevious ABIPrevious TBI +-------+-----------+-----------+------------+------------+ Right             0.65       0.94        0.89         +-------+-----------+-----------+------------+------------+ Left   0.95        0.66       1.14        0.71         +-------+-----------+-----------+------------+------------+   Unable to obtain ABIs of RLE due to patient pain intolerance and open wounds to calf.  Summary: Right: The right toe-brachial index is mildly abnormal. Left: Resting left ankle-brachial index is within normal range. The left toe-brachial index is mildly abnormal. *See table(s) above for measurements and observations.     Preliminary    US  Paracentesis Result Date: 01/21/2023 INDICATION: Patient with history of right lower leg cellulitis, cirrhosis, heart failure, chronic kidney disease, coronary artery disease, diabetes, recurrent ascites. Request received for diagnostic and therapeutic paracentesis. EXAM: ULTRASOUND GUIDED DIAGNOSTIC AND THERAPEUTIC PARACENTESIS MEDICATIONS: 8 mL 1% lidocaine  COMPLICATIONS: None immediate. PROCEDURE: Informed written consent was obtained from the patient after a discussion of the risks, benefits and alternatives to treatment. A timeout was performed prior to the initiation of the procedure. Initial ultrasound scanning demonstrates a large amount of ascites within the right lower abdominal quadrant. The right lower abdomen was prepped and draped in the usual sterile fashion. 1% lidocaine  was used for local anesthesia. Following this, a 19 gauge, 10-cm, Yueh catheter was introduced. An ultrasound image was saved for documentation purposes. The paracentesis was  performed. The catheter was removed and a dressing was applied. The patient tolerated the procedure well without immediate post procedural complication. FINDINGS: A total of approximately 5.9 liters of yellow fluid was removed. Samples were sent to the laboratory as requested by the clinical team. IMPRESSION: Successful ultrasound-guided diagnostic and therapeutic paracentesis yielding 5.9 liters of peritoneal fluid. Performed by: Franky Kelsie RIGGERS Electronically Signed   By: Thom Hall M.D.   On: 01/21/2023 16:25   DG  Chest 2 View Result Date: 01/21/2023 CLINICAL DATA:  Shortness of breath. EXAM: CHEST - 2 VIEW COMPARISON:  01/11/2023 FINDINGS: The pacer wires are stable. Stable surgical changes bypass surgery with left atrial appendage clip noted. The heart is within normal limits in size for age. The mediastinal and hilar contours are within normal limits. The central vascular congestion without overt pulmonary edema. Left pleural effusion is noted with overlying atelectasis. The bony thorax is intact. IMPRESSION: 1. Central vascular congestion without overt pulmonary edema. 2. Left pleural effusion with overlying atelectasis. Electronically Signed   By: MYRTIS Stammer M.D.   On: 01/21/2023 11:35   DG Tibia/Fibula Right Result Date: 01/21/2023 CLINICAL DATA:  Left lower extremity pain and infection. EXAM: RIGHT TIBIA AND FIBULA - 2 VIEW COMPARISON:  01/10/2014. FINDINGS: No acute fracture or dislocation. No aggressive osseous lesion. There are degenerative changes of the knee joint in the form of mildly reduced medial tibio-femoral compartment joint space, tibial spiking and osteophytosis. Ankle mortise appears intact. No focal soft tissue swelling. No radiopaque foreign bodies. IMPRESSION: *No acute osseous abnormality of the right leg. Electronically Signed   By: Ree Molt M.D.   On: 01/21/2023 10:04           LOS: 1 day   Time spent= 35 mins    Burgess JAYSON Dare, MD [facility]  If 7PM-7AM, please contact night-coverage  01/22/2023, 12:17 PM

## 2023-01-22 NOTE — Progress Notes (Signed)
 Initial Nutrition Assessment  INTERVENTION:   -Multivitamin with minerals daily  -B complex w/ Vitamin C daily  -Encouraged PO intakes  -Pt declines all other nutrition interventions  NUTRITION DIAGNOSIS:   Increased nutrient needs related to chronic illness (cirrhosis, CHF, CKD, DM) as evidenced by estimated needs.  GOAL:   Patient will meet greater than or equal to 90% of their needs  MONITOR:   PO intake, Labs, Weight trends, I & O's  REASON FOR ASSESSMENT:   Consult Wound healing  ASSESSMENT:   83 year old with history of chronic diastolic CHF, CAD status post CABG, chronic carotid artery occlusion, CKD stage IV, DM 2, PVD, peripheral neuropathy, HLD, HTN, TIA, P A-fib with pacemaker in place holding anticoagulation due to history of GAVE, GERD, gastritis, to the ER with complaints of left lower extremity erythema and pain despite of being on oral doxycycline  as outpatient.  Also noted to have abdominal distention.  Upon admission noted to have some signs of volume overload and started on IV antibiotics for possible cellulitis.  Patient underwent paracentesis on 1/6, 6 L was removed.  Patient in room, awaiting visit from wound care. Pt reports eating a big breakfast of 4 pancakes, sausage and orange juice. Pt not receptive to protein supplements and states he takes a lot of vitamin supplements. Would not list them for RD, referred to his med list in chart. RD reviewed. Pt takes Vitamin C, zinc , MVI, Vitamin D , B-12, iron , and mg supplements at home. Will order daily MVI and B complex w/ C here to support wound healing. Pt was not interested in any diet education. Mainly wanted wound care to see him.  Per weight records, weight has increased since 01/11/23, likely from fluid accumulation. Needs new weight s/p paracentesis.  Medications: Lasix , MAG-OX  Labs reviewed: CBGs: 52-136 Low Na   NUTRITION - FOCUSED PHYSICAL EXAM:  No depletions noted but very swollen so may be  masking fat and muscle losses.   Diet Order:   Diet Order             Diet heart healthy/carb modified Room service appropriate? Yes; Fluid consistency: Thin; Fluid restriction: 1200 mL Fluid  Diet effective now                   EDUCATION NEEDS:   Not appropriate for education at this time  Skin:  Skin Assessment: Skin Integrity Issues: Skin Integrity Issues:: Other (Comment) Other: venous stasis ulcer on RLE  Last BM:  1/6  Height:   Ht Readings from Last 1 Encounters:  01/21/23 5' 10 (1.778 m)    Weight:   Wt Readings from Last 1 Encounters:  01/21/23 117 kg    BMI:  Body mass index is 37.02 kg/m.  Estimated Nutritional Needs:   Kcal:  2200-2400  Protein:  110-120g  Fluid:  2.2L/day  Morna Lee, MS, RD, LDN Inpatient Clinical Dietitian Contact via Secure chat

## 2023-01-22 NOTE — Plan of Care (Signed)

## 2023-01-22 NOTE — Significant Event (Addendum)
 Hypoglycemic Event  CBG: 65  Treatment: 4 oz juice/soda  Symptoms: None  Follow-up CBG: Time:0800 CBG Result:NA, Patient declined to have his blood sugar rechecked.   Possible Reasons for Event: Unknown   Patient is eating his breakfast now   Edward [name]

## 2023-01-22 NOTE — Consult Note (Signed)
 See WOC consult note 01/21/2023.    Patient requested to speak with wound care nurse and have me come do his wound care.  Patient was disappointed that I would not be cutting off the dead tissue from his leg. He says he does this at home. I told him this was not safe to do as much of the necrotic tissue is tightly adhered.  We discussed that IV antibiotics are the main component of treatment of his R leg cellulitis.  I was able to remove some of the dry necrotic tissue to R lower leg using Vashe and gauze.  I then placed Vashe moistened gauze on necrotic tissue on anterior surface of leg to assist with softening and hopefully removing more dead tissue on next dressing change.  I covered surrounding intact skin with  Xeroform gauze, placed ABD pads to anterior and posterior aspect of leg and secured entire dressing with Kerlix roll gauze beginning above toes and ending below knee.  Placed Ace bandage wrapped in same fashion as Kerlix.  Patient had received IV Dilaudid  prior to wound care and tolerated wound care quite well.    I encouraged patient not to debride his own wounds at home.  Leg overall looks improved from my initial assessment  when he was in the Emergency Room 01/21/2023.  WOC team will not follow. Discussed with patient bedside RN will perform wound care going forward.  Patient is agreeable to this.   Thank you,    Powell Bar MSN, RN-BC, [employer] [phone]

## 2023-01-22 NOTE — Progress Notes (Signed)
 Latest Reference Range & Units 01/21/23 20:11 01/21/23 20:50 01/21/23 21:14 01/21/23 21:32 01/21/23 22:20  Glucose-Capillary 70 - 99 mg/dL 56 (L) 52 (L) 61 (L) 70 119 (H)  (L): Data is abnormally low (H): Data is abnormally high   1950: new pt came from ED via wheelchair. Pt is A&O x 4. VSS, on room air. C/o right leg pain, declines meds.   Upon arrival to unit, noted blood sugar 56, -see above readings. Pt asymptomatic and A&O x 4 and able to take po. Hypoglycemia protocol followed as ordered - see above CBG readings. J.Daniels, NP notified via secure chat and updated.   2 skin assessment done with Corky Moons RN.  Patient refused to have RLE wound care done by this RN despite education and pt verbalizes that he would like to have wound care nurse (WOC) to do dressings to his RLE in the morning. J.Daniels, NP notified via secure chat.   Uses cane to walk to bathroom. Educated pt on falls and safety precautions, call  bell, plan of care, pt verbalizes understanding.   Bed alarm on. Call bell in reach. Will continue to monitor.

## 2023-01-22 NOTE — Progress Notes (Addendum)
 RN  explained to patient there is an order to clean and dress his wound on his lower legs, but he verbalizes that he prefers wound nurse to change his wounds.

## 2023-01-22 NOTE — Progress Notes (Signed)
 ABI has been completed.   Results can be found under chart review under CV PROC. 01/22/2023 10:05 AM Dyshaun Bonzo RVT, RDMS

## 2023-01-22 NOTE — Hospital Course (Addendum)
 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  Also noted to have abdominal distention.  Upon admission noted to have some signs of volume overload and started on IV antibiotics for possible cellulitis.  Patient underwent paracentesis on 1/6, 6 L was removed.  Currently slowly improving on IV Rocephin  and legs improved so we will change to oral linezolid  and patient is stable for discharge and will need to follow-up with PCP and ID in outpatient setting as well as GI.  Assessment & Plan:  Principal Problem:   Cellulitis of right lower extremity Active Problems:   Cirrhosis of liver (HCC)   PAF (paroxysmal atrial fibrillation) (HCC)   Diabetes mellitus (HCC)   Chronic diastolic CHF (congestive heart failure) (HCC)   CKD (chronic kidney disease) stage 4, GFR 15-29 ml/min (HCC)   Benign essential hypertension   Coronary artery disease   Iron  deficiency anemia   Gout  Cellulitis of right lower extremity with chronic skin changes -Outpatient p.o. doxycycline , failed treatment.  Change Vanc/Roc/Flagyl  > Rocephin .  -WOC following.  24 more hours of IV antibiotics thereafter Linezolid  PO  -CT of the right lower extremity shows moderate cellulitic changes with lots of chronic changes.  No clear evidence of osteomyelitis or abscess formation but should keep a close eye out on this area and have a close follow-up.  ABIs are overall unremarkable. 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  Supportive care, monitor. -Status post albumin  -Follows outpatient [facility] in the outpatient setting and resume Diuretics    Diarrhea mellitus type II Peripheral neuropathy -Slight scale and Accu-Chek.  Will resume home medications as appropriate -Continue gabapentin  -CBG Trend: Recent Labs  Lab 01/23/23 1641 01/23/23 2133 01/24/23 0745 01/24/23 1223 01/24/23 1656 01/24/23 2011 01/25/23 0854  GLUCAP 135* 119* 114* 104* 99 130* 126*    PAF (paroxysmal atrial fibrillation) (HCC) -Not on anticoagulation.  Prior history of GI bleed -Continue carvedilol  for rate control.   CAD s/p CABG Chronic diastolic CHF (congestive heart failure) (HCC) Peripheral vascular disease -Currently chest pain-free. -Resumed Coreg  and will resume his Lasix  later in outpatient setting with close PCP follow-up    CKD (chronic kidney disease) stage 4, GFR 15-29 ml/min (HCC) -Baseline Cr 2.6-3.4. Will hold lasix  today as Cr trended up.  -BUN/Cr Trend: Recent Labs  Lab 01/11/23 1219 01/21/23 1037 01/22/23 0434 01/23/23 0433 01/24/23 0425 01/25/23 0434  BUN 62* 64* 64* 61* 63* 57*  CREATININE 3.35* 3.37* 3.31* 2.62* 3.35* 3.51*  -Avoid Nephrotoxic Medications, Contrast Dyes, Hypotension and Dehydration to Ensure Adequate Renal Perfusion and will need to Renally Adjust Meds -Continue to Monitor and Trend Renal Function carefully and repeat CMP within 1 week  Benign Essential Hypertension -Continue antihypertensives as above.   -IV as needed -Follow up with PCP within 1-2 weeks   Iron  deficiency anemia -Stable, Transfuse PRBC as needed. -Hgb/Hct Trend: Recent Labs  Lab 01/11/23 1219 01/21/23 1037 01/22/23  [id number] 01/23/23 0433 01/24/23 0425 01/25/23 0434  HGB 11.0* 11.2* 10.6* 10.7* 11.1* 11.2*  HCT 36.3* 37.1* 36.4* 36.6* 37.7* 36.5*  MCV 81.6 84.3 85.4 85.5 84.7 82.6  -Check Anemia Panel  in the outpatient and continue to Monitor for S/Sx of Bleeding and repeat CBC within 1 week  Gout Continue allopurinol  150 mg 3 times a week.  Class II Obesity -Complicates overall prognosis and care -Estimated body mass index is 37.02 kg/m as calculated from the following:   Height as of this encounter: 5' 10 (1.778 m).   Weight as of this encounter: 117 kg.  -Weight Loss and Dietary Counseling given  PT- No follow up.

## 2023-01-23 DIAGNOSIS — L03115 Cellulitis of right lower limb: Secondary | ICD-10-CM | POA: Diagnosis not present

## 2023-01-23 LAB — CBC
HCT: 36.6 % — ABNORMAL LOW (ref 39.0–52.0)
Hemoglobin: 10.7 g/dL — ABNORMAL LOW (ref 13.0–17.0)
MCH: 25 pg — ABNORMAL LOW (ref 26.0–34.0)
MCHC: 29.2 g/dL — ABNORMAL LOW (ref 30.0–36.0)
MCV: 85.5 fL (ref 80.0–100.0)
Platelets: 214 10*3/uL (ref 150–400)
RBC: 4.28 MIL/uL (ref 4.22–5.81)
RDW: 23.7 % — ABNORMAL HIGH (ref 11.5–15.5)
WBC: 8.4 10*3/uL (ref 4.0–10.5)
nRBC: 0 % (ref 0.0–0.2)

## 2023-01-23 LAB — GLUCOSE, CAPILLARY
Glucose-Capillary: 78 mg/dL (ref 70–99)
Glucose-Capillary: 104 mg/dL — ABNORMAL HIGH (ref 70–99)
Glucose-Capillary: 135 mg/dL — ABNORMAL HIGH (ref 70–99)
Glucose-Capillary: 119 mg/dL — ABNORMAL HIGH (ref 70–99)

## 2023-01-23 LAB — BASIC METABOLIC PANEL
Anion gap: 10 (ref 5–15)
BUN: 61 mg/dL — ABNORMAL HIGH (ref 8–23)
CO2: 24 mmol/L (ref 22–32)
Calcium: 7.9 mg/dL — ABNORMAL LOW (ref 8.9–10.3)
Chloride: 96 mmol/L — ABNORMAL LOW (ref 98–111)
Creatinine, Ser: 2.62 mg/dL — ABNORMAL HIGH (ref 0.61–1.24)
GFR, Estimated: 24 mL/min — ABNORMAL LOW (ref >60)
Glucose, Bld: 81 mg/dL (ref 70–99)
Potassium: 4.1 mmol/L (ref 3.5–5.1)
Sodium: 130 mmol/L — ABNORMAL LOW (ref 135–145)

## 2023-01-23 LAB — PHOSPHORUS: Phosphorus: 3.5 mg/dL (ref 2.5–4.6)

## 2023-01-23 LAB — MAGNESIUM: Magnesium: 2.5 mg/dL — ABNORMAL HIGH (ref 1.7–2.4)

## 2023-01-23 MED ORDER — VANCOMYCIN HCL 1.5 G IV SOLR
1500.0000 mg | INTRAVENOUS | Status: DC
  Filled 2023-01-23: qty 30

## 2023-01-23 MED ORDER — MEDIHONEY WOUND/BURN DRESSING EX PSTE
1.0000 | PASTE | Freq: Every day | CUTANEOUS | Status: DC
Start: 2023-01-23 — End: 2023-01-25
  Administered 2023-01-23 – 2023-01-25 (×3): 1 via TOPICAL
  Filled 2023-01-23 (×2): qty 44

## 2023-01-23 NOTE — Plan of Care (Signed)
  Problem: Education: Goal: Ability to describe self-care measures that may prevent or decrease complications (Diabetes Survival Skills Education) will improve Outcome: Progressing Goal: Individualized Educational Video(s) Outcome: Progressing   Problem: Coping: Goal: Ability to adjust to condition or change in health will improve Outcome: Progressing   Problem: Fluid Volume: Goal: Ability to maintain a balanced intake and output will improve Outcome: Progressing   Problem: Health Behavior/Discharge Planning: Goal: Ability to identify and utilize available resources and services will improve Outcome: Progressing Goal: Ability to manage health-related needs will improve Outcome: Progressing   Problem: Nutritional: Goal: Maintenance of adequate nutrition will improve Outcome: Progressing Goal: Progress toward achieving an optimal weight will improve Outcome: Progressing   Problem: Skin Integrity: Goal: Risk for impaired skin integrity will decrease Outcome: Progressing   Problem: Tissue Perfusion: Goal: Adequacy of tissue perfusion will improve Outcome: Progressing   Problem: Education: Goal: Knowledge of General Education information will improve Description: Including pain rating scale, medication(s)/side effects and non-pharmacologic comfort measures Outcome: Progressing   Problem: Health Behavior/Discharge Planning: Goal: Ability to manage health-related needs will improve Outcome: Progressing

## 2023-01-23 NOTE — Progress Notes (Addendum)
 Orders to change RLE wound dressing this morning, but pt refused at this time so that he can sleep and would like to have dressing changed later this morning.

## 2023-01-23 NOTE — Progress Notes (Signed)
 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  Also noted to have abdominal distention.  Upon admission noted to have some signs of volume overload and started on IV antibiotics for possible cellulitis.  Patient underwent paracentesis on 1/6, 6 L was removed.   Assessment & Plan:  Principal Problem:   Cellulitis of right lower extremity Active Problems:   Cirrhosis of liver (HCC)   PAF (paroxysmal atrial fibrillation) (HCC)   Diabetes mellitus (HCC)   Chronic diastolic CHF (congestive heart failure) (HCC)   CKD (chronic kidney disease) stage 4, GFR 15-29 ml/min (HCC)   Benign essential hypertension   Coronary artery disease   Iron  deficiency anemia   Gout    Cellulitis of right lower extremity with chronic skin changes Patient has not done well on outpatient p.o. doxycycline , failed treatment.  Currently on broad-spectrum IV antibiotics.  Pain control CT of the right lower extremity shows moderate cellulitic changes with lots of chronic changes.  No clear evidence of osteomyelitis or abscess formation but should keep a close eye out on this area and have a close follow-up.  ABIs are overall unremarkable.   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  Supportive care, monitor. Status post albumin  Follows outpatient [facility]   Diarrhea mellitus type II Peripheral neuropathy Slight scale and Accu-Chek.  Will resume home medications  as appropriate Continue gabapentin      PAF (paroxysmal atrial fibrillation) (HCC) Not on anticoagulation.  Prior history of GI bleed Continue carvedilol  for rate control.     Coronary artery disease status post CABG   Chronic diastolic CHF (congestive heart failure) (HCC) Peripheral vascular disease Currently chest pain-free. Continue carvedilol  12.5 mg p.o. twice daily.   Continue furosemide  80 mg p.o. twice daily.     CKD (chronic kidney disease) stage 4, GFR 15-29 ml/min (HCC) Monitor renal function electrolytes.     Benign essential hypertension Continue antihypertensives as above.  IV as needed     Iron  deficiency anemia Monitor hematocrit and hemoglobin. Transfuse PRBC as needed.     Gout Continue allopurinol  150 mg 3 times a week.       DVT prophylaxis: SCDs Start: 01/21/23 1228      Code Status: Full Code Family Communication:   Status is: Inpatient Remains inpatient appropriate because: Continue hospital stay for further management for right lower extremity cellulitis    Subjective: Feeling little better today Some improvement in pain and swelling  Examination:  General exam: Appears calm and comfortable  Respiratory system: Clear to auscultation. Respiratory effort normal. Cardiovascular system: S1 & S2 heard, RRR. No JVD, murmurs, rubs, gallops or clicks. No pedal edema. Gastrointestinal system: Abdomen is nondistended, soft and nontender. No organomegaly or masses felt. Normal bowel sounds heard. Central nervous system: Alert and oriented. No focal neurological deficits. Extremities: Symmetric 5 x 5 power. Skin: Right lower extremity erythema and flaky skin, some improvement Psychiatry: Judgement and insight appear normal. Mood & affect appropriate.  Diet Orders (From admission, onward)     Start     Ordered   01/21/23 1228  Diet heart healthy/carb modified Room service appropriate? Yes; Fluid consistency: Thin; Fluid  restriction: 1200 mL Fluid  Diet effective now       Question Answer Comment  Diet-HS Snack? Nothing   Room service appropriate? Yes   Fluid consistency: Thin   Fluid restriction: 1200 mL Fluid      01/21/23 1230            Objective: Vitals:   01/22/23 1932 01/22/23 2126 01/22/23 2126 01/23/23 0520  BP: 125/81 123/70 123/70 111/67  Pulse: 66 66 68 67  Resp: 20   16  Temp: (!) 97.4 F (36.3 C)   97.7 F (36.5 C)  TempSrc: Oral   Oral  SpO2: 100%  100% 97%  Weight:      Height:        Intake/Output Summary (Last 24 hours) at 01/23/2023 1139 Last data filed at 01/23/2023 0601 Gross per 24 hour  Intake 817.1 ml  Output --  Net 817.1 ml   Filed Weights   01/21/23 0936  Weight: 117 kg    Scheduled Meds:  allopurinol   150 mg Oral Q M,W,F   B-complex with vitamin C  1 tablet Oral Daily   carvedilol   6.25 mg Oral BID   furosemide   80 mg Oral BID   gabapentin   200 mg Oral Daily   gabapentin   300 mg Oral QHS   leptospermum manuka honey  1 Application Topical Daily   levothyroxine   25 mcg Oral Q0600   multivitamin with minerals  1 tablet Oral Daily   pantoprazole   40 mg Oral BID   Continuous Infusions:  cefTRIAXone  (ROCEPHIN )  IV Stopped (01/22/23 1900)   metronidazole  500 mg (01/23/23 0959)   vancomycin       Nutritional status Signs/Symptoms: estimated needs Interventions: MVI, Refer to RD note for recommendations Body mass index is 37.02 kg/m.  Data Reviewed:   CBC: Recent Labs  Lab 01/21/23 1037 01/22/23 0434 01/23/23 0433  WBC 10.4 9.2 8.4  NEUTROABS 7.8*  --   --   HGB 11.2* 10.6* 10.7*  HCT 37.1* 36.4* 36.6*  MCV 84.3 85.4 85.5  PLT 219 212 214   Basic Metabolic Panel: Recent Labs  Lab 01/21/23 1037 01/22/23 0434 01/23/23 0433  NA 136 132* 130*  K 4.5 4.3 4.1  CL 99 97* 96*  CO2 27 26 24   GLUCOSE 68* 77 81  BUN 64* 64* 61*  CREATININE 3.37* 3.31* 2.62*  CALCIUM  8.5* 7.8* 7.9*  MG  --   --  2.5*  PHOS  --   --  3.5    GFR: Estimated Creatinine Clearance: 28.8 mL/min (A) (by C-G formula based on SCr of 2.62 mg/dL (H)). Liver Function Tests: Recent Labs  Lab 01/21/23 1037 01/22/23 0434  AST 27 23  ALT 14 12  ALKPHOS 132* 109  BILITOT 1.6* 1.6*  PROT 7.5 6.4*  ALBUMIN  2.6* 2.2*   No results for input(s): LIPASE, AMYLASE in the last 168 hours. No results for input(s): AMMONIA in the last 168 hours. Coagulation Profile: Recent Labs  Lab 01/21/23 1037  INR 1.4*   Cardiac Enzymes: No results for input(s): CKTOTAL, CKMB, CKMBINDEX, TROPONINI in the last 168 hours. BNP (last 3 results) No results for input(s): PROBNP in the last 8760 hours. HbA1C: No results for input(s): HGBA1C in the last 72 hours. CBG: Recent Labs  Lab 01/22/23 [phone]  01/22/23 1132 01/22/23 1613 01/22/23 2131 01/23/23 0808  GLUCAP 65* 136* 171* 138* 78   Lipid Profile: No results for input(s): CHOL, HDL, LDLCALC, TRIG, CHOLHDL, LDLDIRECT in the last 72 hours. Thyroid  Function Tests: No results for input(s): TSH, T4TOTAL, FREET4, T3FREE, THYROIDAB in the last 72 hours. Anemia Panel: No results for input(s): VITAMINB12, FOLATE, FERRITIN, TIBC, IRON , RETICCTPCT in the last 72 hours. Sepsis Labs: Recent Labs  Lab 01/21/23 1039  LATICACIDVEN 1.8    Recent Results (from the past 240 hours)  Blood culture (routine x 2)     Status: None (Preliminary result)   Collection Time: 01/21/23 10:47 AM   Specimen: BLOOD  Result Value Ref Range Status   Specimen Description   Final    BLOOD LEFT ANTECUBITAL Performed at [facility], [address] [place], [place] [address]    Special Requests   Final    BOTTLES DRAWN AEROBIC AND ANAEROBIC Blood Culture results may not be optimal due to an inadequate volume of blood received in culture bottles Performed at [facility], [address], [place], [place] [address]    Culture   Final     NO GROWTH 2 DAYS Performed at [facility], [address], [place], [place] [address]    Report Status PENDING  Incomplete  Blood culture (routine x 2)     Status: None (Preliminary result)   Collection Time: 01/21/23 10:48 AM   Specimen: BLOOD  Result Value Ref Range Status   Specimen Description   Final    BLOOD LEFT ANTECUBITAL Performed at [facility], [address], [place], [place] [address]    Special Requests   Final    BOTTLES DRAWN AEROBIC AND ANAEROBIC Blood Culture results may not be optimal due to an inadequate volume of blood received in culture bottles Performed at [facility], [address] [place], [place] [address]    Culture   Final    NO GROWTH 2 DAYS Performed at [facility], [address] [place], [place] [address]    Report Status PENDING  Incomplete         Radiology Studies: VAS US  ABI WITH/WO TBI Result Date: 01/22/2023  LOWER EXTREMITY DOPPLER STUDY Patient Name:  ZVI DUPLANTIS  Date of Exam:   01/22/2023 Medical Rec #: [id number]        Accession #:    [id number] Date of Birth: 1940/11/05        Patient Gender: M Patient Age:   37 years Exam Location:  [facility] Procedure:      VAS US  ABI WITH/WO TBI Referring Phys: DAVID ORTIZ --------------------------------------------------------------------------------  Indications: RLE wound/cellulitis High Risk Factors: Hypertension, hyperlipidemia, Diabetes, past history of                    smoking, coronary artery disease, prior CVA. Other Factors: CHF, CKD, Afib, PM, PVD, HX of CABG,.  Vascular Interventions: 03/06/2012 LLE CFA to BK pop bypass graft. Limitations: Today's exam was limited due to patient intolerant to cuff pressure              and an open wound. Comparison Study: Previous exam on 04/19/2022 Performing Technologist: Leigh Rom RVT, RDMS  Examination Guidelines: A complete evaluation includes at minimum, Doppler waveform signals and  systolic blood pressure reading at the level of bilateral brachial, anterior tibial, and posterior tibial arteries, when vessel segments are accessible. Bilateral testing  is considered an integral part of a complete examination. Photoelectric Plethysmograph (PPG) waveforms and toe systolic pressure readings are included as required and additional duplex testing as needed. Limited examinations for reoccurring indications may be performed as noted.  ABI Findings: +---------+------------------+-----+---------+--------+ Right    Rt Pressure (mmHg)IndexWaveform Comment  +---------+------------------+-----+---------+--------+ Brachial 136                    triphasic         +---------+------------------+-----+---------+--------+ PTA                             biphasic        +---------+------------------+-----+---------+--------+ DP                              biphasic        +---------+------------------+-----+---------+--------+ Great Toe88                0.65 Abnormal          +---------+------------------+-----+---------+--------+ +---------+------------------+-----+---------+-------+ Left     Lt Pressure (mmHg)IndexWaveform Comment +---------+------------------+-----+---------+-------+ Brachial 129                    triphasic        +---------+------------------+-----+---------+-------+ PTA      128               0.94 biphasic         +---------+------------------+-----+---------+-------+ DP       129               0.95 biphasic         +---------+------------------+-----+---------+-------+ Great Toe90                0.66 Normal           +---------+------------------+-----+---------+-------+ +-------+-----------+-----------+------------+------------+ ABI/TBIToday's ABIToday's TBIPrevious ABIPrevious TBI +-------+-----------+-----------+------------+------------+ Right             0.65       0.94        0.89          +-------+-----------+-----------+------------+------------+ Left   0.95       0.66       1.14        0.71         +-------+-----------+-----------+------------+------------+  Unable to obtain ABIs of RLE due to patient pain intolerance and open wounds to calf.  Summary: Right: The right toe-brachial index is mildly abnormal. Left: Resting left ankle-brachial index is within normal range. The left toe-brachial index is mildly abnormal. *See table(s) above for measurements and observations.  Electronically signed by Gaile New MD on 01/22/2023 at 8:05:45 PM.    Final    CT TIBIA FIBULA RIGHT WO CONTRAST Result Date: 01/22/2023 CLINICAL DATA:  Soft tissue mass, lower leg, deep. Cellulitis of right lower extremity with chronic skin changes. Fell doxycycline  treatment. Currently on broad-spectrum IV antibiotics. Concerns for deeper infection. EXAM: CT OF THE LOWER RIGHT EXTREMITY WITHOUT CONTRAST TECHNIQUE: Multidetector CT imaging of the right lower extremity was performed according to the standard protocol. RADIATION DOSE REDUCTION: This exam was performed according to the departmental dose-optimization program which includes automated exposure control, adjustment of the mA and/or kV according to patient size and/or use of iterative reconstruction technique. COMPARISON:  Right tibia and fibula radiographs 01/21/2023, right knee radiographs 01/10/2014 FINDINGS: Bones/Joint/Cartilage There is moderate patellar subchondral cystic change. Moderate peripheral patellar degenerative osteophytes. There  appear to be punched out, lytic erosions with thin sclerotic borders within the medial aspect of medial femoral condyle and region measuring up to 1.7 cm in AP dimension, 1.8 cm in craniocaudal dimension, and 0.4 cm in transverse depth (coronal series 6, image 73 and axial series 7, image 24). This is similar to the erosions seen within the right foot and ankle on today's CT and raises the question of gout. Moderate  medial compartment of the knee joint space narrowing. Subchondral cystic changes versus erosions within the posterior non weight-bearing medial femoral condyle (sagittal series 8 images 82 through 89). Ligaments Suboptimally assessed by CT. Muscles and Tendons Mild fatty infiltration of the medial head of the gastrocnemius muscle. No gross tendon tear is visualized. Soft tissues Partially visualized mild-to-moderate joint effusion at the knee. Moderate diffuse calf subcutaneous fat edema and swelling. Moderate diffuse skin thickening. Mild irregularity of the anterior shin skin surface (axial series 9 images 121 through 230). Moderate to high-grade atherosclerotic calcifications. IMPRESSION: 1. Moderate diffuse calf subcutaneous fat edema and swelling. Moderate diffuse skin thickening. Mild irregularity of the anterior shin skin surface. These findings are compatible with cellulitis and/or chronic venous insufficiency. Recommend clinical correlation 2. Partially visualized mild-to-moderate joint effusion at the knee. 3. There appear to be punched out, lytic erosions with thin sclerotic borders within the medial aspect of medial femoral condyle in a region measuring up to 1.7 cm in AP dimension, 1.8 cm in craniocaudal dimension, and 0.4 cm in transverse depth. This is similar to the erosions seen within the right and ankle foot on today's CT and again raises the question of gout. Recommend clinical correlation. 4. Moderate medial compartment of the knee joint space narrowing. Subchondral cystic changes versus erosions within the posterior non weight-bearing medial femoral condyle. 5. No soft tissue ulcer. No definite CT evidence of acute osteomyelitis. Electronically Signed   By: Tanda Lyons M.D.   On: 01/22/2023 16:39   CT FOOT RIGHT WO CONTRAST Result Date: 01/22/2023 CLINICAL DATA:  Deep foot soft tissue mass. Cellulitis of right lower extremity with chronic skin changes. Failed p.o. doxycycline  treatment.  Currently on broad-spectrum IV antibiotics as inpatient. Concerns for deeper infection. EXAM: CT OF THE RIGHT FOOT WITHOUT CONTRAST TECHNIQUE: Multidetector CT imaging of the right foot was performed according to the standard protocol. Multiplanar CT image reconstructions were also generated. RADIATION DOSE REDUCTION: This exam was performed according to the departmental dose-optimization program which includes automated exposure control, adjustment of the mA and/or kV according to patient size and/or use of iterative reconstruction technique. COMPARISON:  Right tibia and fibula radiographs 01/21/2023 FINDINGS: Bones/Joint/Cartilage There is moderate erosion along an approximate 7 mm length of a distal aspect of the middle phalanx and proximal aspect of the distal phalanx of the fourth toe (coronal series 7, image 95 and sagittal series 8, image 57). There is mild calcification within the periphery of this erosion. There is high-grade fragmentation/erosion of the medial great toe metatarsophalangeal joint sesamoid (sagittal images 97-98 and coronal series 7 images 94 through 98). There are two small adjacent lucent punched out, lytic erosions with thin sclerotic borders, measuring up to 4 mm individually (coronal series 7, image 86 and sagittal images 99 through 101) within the dorsomedial aspect of the great toe metatarsal head. There is mild soft tissue swelling of the surrounding predominantly medial aspect of the great toe metatarsal head. This raises the question of gout, which also may be the cause for fragmentation of the medial great toe  metatarsophalangeal joint sesamoid. There are additional well-circumscribed lytic erosions with thin sclerotic borders within the midfoot and hindfoot, including within the lateral base of the fourth metatarsal (series 7, image 91 and series 8, image 54), proximal dorsal medial aspect of the fifth metatarsal (series 8, image 31 and series 7, image 87), moderate to  high-grade erosions within the second and third tarsometatarsal joints, mild erosion within the distal medial aspect of the medial cuneiform at the first tarsometatarsal joint (series 7, image 72). Moderate erosion at the posteromedial aspect of medial cuneiform at the articulation with the navicular and intermediate cuneiform (series 8, image 78 and series 7, image 70). There is moderate erosion at the far medial aspect of the talar body extending towards the posterosuperior medial talar dome with mild erosions within the adjacent medial malleolus (coronal series 6 images 135 through 145, sagittal series 8, image 78). a mild-to-moderate erosions within the posterolateral distal tibia and adjacent fibula (coronal series 6, image 151). Ligaments Suboptimally assessed by CT. Muscles and Tendons There is diffuse moderate to high-grade atrophy and fatty infiltration of the intrinsic plantar and dorsal foot musculature. Soft tissues No soft tissue ulcer is identified. There is moderate diffuse soft tissue swelling and subcutaneous fat edema, greatest within the dorsal midfoot to forefoot. No definite walled-off abscess is seen. Mild-to-moderate atherosclerotic calcifications. IMPRESSION: 1. Moderate diffuse soft tissue swelling and subcutaneous fat edema, greatest within the dorsal midfoot to forefoot. This is compatible with cellulitis. Recommend clinical correlation. No definite walled-off abscess is seen. 2. There are punched out, lytic erosions within the medial aspect of the great toe metatarsal head as well as numerous portions of the tarsometatarsal joints, cuneiforms, and medial talus and adjacent medialis, and the posterior lateral tibia and adjacent fibula articulation. This raises the question of gout, which also may be the cause for fragmentation of the medial great toe metatarsophalangeal joint sesamoid. Erosion from osteomyelitis are felt less likely. 3. There is erosion with peripheral calcification seen  in the region of the middle phalanx of the fourth toe extending into the adjacent distal phalanx. This also may be secondary to a crystalline arthropathy such as gout. Osteomyelitis can have a similar appearance, however there is no soft tissue ulcer in this region to suggest infection. Recommend clinical correlation. Electronically Signed   By: Tanda Lyons M.D.   On: 01/22/2023 16:31   US  Paracentesis Result Date: 01/21/2023 INDICATION: Patient with history of right lower leg cellulitis, cirrhosis, heart failure, chronic kidney disease, coronary artery disease, diabetes, recurrent ascites. Request received for diagnostic and therapeutic paracentesis. EXAM: ULTRASOUND GUIDED DIAGNOSTIC AND THERAPEUTIC PARACENTESIS MEDICATIONS: 8 mL 1% lidocaine  COMPLICATIONS: None immediate. PROCEDURE: Informed written consent was obtained from the patient after a discussion of the risks, benefits and alternatives to treatment. A timeout was performed prior to the initiation of the procedure. Initial ultrasound scanning demonstrates a large amount of ascites within the right lower abdominal quadrant. The right lower abdomen was prepped and draped in the usual sterile fashion. 1% lidocaine  was used for local anesthesia. Following this, a 19 gauge, 10-cm, Yueh catheter was introduced. An ultrasound image was saved for documentation purposes. The paracentesis was performed. The catheter was removed and a dressing was applied. The patient tolerated the procedure well without immediate post procedural complication. FINDINGS: A total of approximately 5.9 liters of yellow fluid was removed. Samples were sent to the laboratory as requested by the clinical team. IMPRESSION: Successful ultrasound-guided diagnostic and therapeutic paracentesis yielding 5.9 liters of peritoneal  fluid. Performed by: Franky Kelsie RIGGERS Electronically Signed   By: Thom Hall M.D.   On: 01/21/2023 16:25           LOS: 2 days   Time spent= 35  mins    Burgess JAYSON Dare, MD [facility]  If 7PM-7AM, please contact night-coverage  01/23/2023, 11:39 AM

## 2023-01-23 NOTE — Progress Notes (Signed)
 OT Cancellation Note  Patient Details Name: Edward [name] MRN: [id number] DOB: 07/07/1940   Cancelled Treatment:    Reason Eval/Treat Not Completed: OT screened, no needs identified, will sign off Per PT patient is at baseline of independence. OT to sign off.  Geofm LEYLAND, MS Acute Rehabilitation Department Office# [phone]  01/23/2023, 4:16 PM

## 2023-01-23 NOTE — Evaluation (Signed)
 Physical Therapy Evaluation Patient Details Name: Edward [name] MRN: [id number] DOB: 18-Nov-1940 Today's Date: 01/23/2023  History of Present Illness  Pt is 83 yo male admitted on 01/21/23 with cellulitis of R LE.  Pt also noted to show signs of volume overload and required paracentesis on 1/6 removing 6 L fluid.  He has hx including but not limited to CHF, CAD, CABG, chronic carotid artery occlusion, CKD stage IV, DM, PVD, neuropathy, HLD, HTN, TIA, afib with pacemaker, GERD, gastritis, GAVE.  Clinical Impression  Pt admitted with above diagnosis.  At home, he is ambulatory with a cane and independent.  He does report 3 falls but states was before he was using a cane.  Pt reports he has been ambulating in his room without assist.  He demonstrated safe transfers and ambulation in room totaling 60' with cane without assist.  Pt had steady balance with his cane.  Pt is near baseline and no further PT indicated.  Pt wants to ambulate to restroom independently -discussed near baseline and safe from PT perspective as long as he was not feeling lightheaded/dizzy- pt provided teach back of this.  Will sign off PT at this time.         If plan is discharge home, recommend the following: Help with stairs or ramp for entrance   Can travel by private vehicle        Equipment Recommendations None recommended by PT  Recommendations for Other Services       Functional Status Assessment Patient has not had a recent decline in their functional status     Precautions / Restrictions Precautions Precautions: Fall      Mobility  Bed Mobility Overal bed mobility: Independent             General bed mobility comments: Supine/sit with bed flat and no rails    Transfers Overall transfer level: Modified independent Equipment used: Straight cane Transfers: Sit to/from Stand Sit to Stand: Modified independent (Device/Increase time)           General transfer comment: STS increased time but no  assist    Ambulation/Gait Ambulation/Gait assistance: Supervision Gait Distance (Feet): 60 Feet Assistive device: Straight cane Gait Pattern/deviations: Step-through pattern Gait velocity: decreased but functional     General Gait Details: Ambulated laps in room (declined hallway), able to navigate around obstacle, turn, stop, open door without LOB; reports near baseline; supervision for eval purpose  Stairs            Wheelchair Mobility     Tilt Bed    Modified Rankin (Stroke Patients Only)       Balance Overall balance assessment: Needs assistance Sitting-balance support: No upper extremity supported Sitting balance-Leahy Scale: Normal     Standing balance support: No upper extremity supported, Single extremity supported Standing balance-Leahy Scale: Fair Standing balance comment: Pt with hx of falls but reports was before using cane.  Last fall was walking down a hill and caught toe on uneven area.  Could static stand and lean forward to adjust sheets without LOB or UE support.  Used cane to ambulate                             Pertinent Vitals/Pain Pain Assessment Pain Assessment: No/denies pain    Home Living Family/patient expects to be discharged to:: Private residence Living Arrangements: Spouse/significant other Available Help at Discharge: Family;Available 24 hours/day Type of Home: House Home Access:  Stairs to enter Entrance Stairs-Rails: Can reach both;Left;Right Entrance Stairs-Number of Steps: 5 Alternate Level Stairs-Number of Steps: has stair lift Home Layout: Two level;Other (Comment) Home Equipment: Rolling Walker (2 wheels);Grab bars - tub/shower;Wheelchair - Web Designer - single point      Prior Function Prior Level of Function : Independent/Modified Independent             Mobility Comments: Using a cane as needed; 3 falls this last year so changed his shoes and added a cane ADLs Comments: reports  independent with adls, iadls , driving     Extremity/Trunk Assessment   Upper Extremity Assessment Upper Extremity Assessment: Overall WFL for tasks assessed    Lower Extremity Assessment Lower Extremity Assessment: Overall WFL for tasks assessed (R LE with dressing and ACE wrap)    Cervical / Trunk Assessment Cervical / Trunk Assessment: Normal  Communication   Communication Communication: Hearing impairment  Cognition Arousal: Alert Behavior During Therapy: WFL for tasks assessed/performed Overall Cognitive Status: Within Functional Limits for tasks assessed                                          General Comments General comments (skin integrity, edema, etc.): VSS; pt reports being near baseline, denies any dizziness/lightheadedness, wants to be able to mobilize to restroom independently    Exercises     Assessment/Plan    PT Assessment Patient does not need any further PT services  PT Problem List         PT Treatment Interventions      PT Goals (Current goals can be found in the Care Plan section)  Acute Rehab PT Goals Patient Stated Goal: walk to restroom on his own PT Goal Formulation: All assessment and education complete, DC therapy    Frequency       Co-evaluation               AM-PAC PT 6 Clicks Mobility  Outcome Measure Help needed turning from your back to your side while in a flat bed without using bedrails?: None Help needed moving from lying on your back to sitting on the side of a flat bed without using bedrails?: None Help needed moving to and from a bed to a chair (including a wheelchair)?: None Help needed standing up from a chair using your arms (e.g., wheelchair or bedside chair)?: None Help needed to walk in hospital room?: None Help needed climbing 3-5 steps with a railing? : A Little 6 Click Score: 23    End of Session   Activity Tolerance: Patient tolerated treatment well Patient left: in bed;with call  bell/phone within reach (left alarm off - demonstrated steady balance, near baseline, wants to be able to go to bathroom on his own) Nurse Communication: Mobility status PT Visit Diagnosis: Other abnormalities of gait and mobility (R26.89)    Time: 8474-8454 PT Time Calculation (min) (ACUTE ONLY): 20 min   Charges:   PT Evaluation $PT Eval Low Complexity: 1 Low   PT General Charges $$ ACUTE PT VISIT: 1 Visit         Benjiman, PT Acute Rehab Services [facility] [phone]   Benjiman VEAR Mulberry 01/23/2023, 4:01 PM

## 2023-01-23 NOTE — Consult Note (Signed)
 WOC nurse performed wound care.  Will change to Medihoney to necrotic tissue daily.  Cleaned R lower leg wound with Vashe, applied Medihoney to necrotic tissue, covered with dry gauze. Xeroform applied to surrounding intact skin.  Covered with ABD pads and wrapped in Kerlix from above toes to right below knee. Secure with Ace bandage.    Thank you,    Powell Bar MSN, RN-BC, [employer] [phone]

## 2023-01-23 NOTE — Plan of Care (Signed)
 VSS, on room air. RLE dressing in place- Clean, dry and intact. Ambulates to bathroom with cane with stand by assist. Educated on falls and safety precautions, call bell and pt verbalizes understanding.  Bed alarm on. Call bell in reach.  Will continue to  monitor   Problem: Education: Goal: Ability to describe self-care measures that may prevent or decrease complications (Diabetes Survival Skills Education) will improve Outcome: Progressing Goal: Individualized Educational Video(s) Outcome: Progressing   Problem: Coping: Goal: Ability to adjust to condition or change in health will improve Outcome: Progressing   Problem: Fluid Volume: Goal: Ability to maintain a balanced intake and output will improve Outcome: Progressing   Problem: Health Behavior/Discharge Planning: Goal: Ability to identify and utilize available resources and services will improve Outcome: Progressing Goal: Ability to manage health-related needs will improve Outcome: Progressing   Problem: Metabolic: Goal: Ability to maintain appropriate glucose levels will improve Outcome: Progressing   Problem: Nutritional: Goal: Maintenance of adequate nutrition will improve Outcome: Progressing Goal: Progress toward achieving an optimal weight will improve Outcome: Progressing   Problem: Skin Integrity: Goal: Risk for impaired skin integrity will decrease Outcome: Progressing   Problem: Tissue Perfusion: Goal: Adequacy of tissue perfusion will improve Outcome: Progressing   Problem: Education: Goal: Knowledge of General Education information will improve Description: Including pain rating scale, medication(s)/side effects and non-pharmacologic comfort measures Outcome: Progressing   Problem: Health Behavior/Discharge Planning: Goal: Ability to manage health-related needs will improve Outcome: Progressing   Problem: Clinical Measurements: Goal: Ability to maintain clinical measurements within normal limits  will improve Outcome: Progressing Goal: Will remain free from infection Outcome: Progressing Goal: Diagnostic test results will improve Outcome: Progressing Goal: Respiratory complications will improve Outcome: Progressing Goal: Cardiovascular complication will be avoided Outcome: Progressing   Problem: Activity: Goal: Risk for activity intolerance will decrease Outcome: Progressing   Problem: Nutrition: Goal: Adequate nutrition will be maintained Outcome: Progressing   Problem: Coping: Goal: Level of anxiety will decrease Outcome: Progressing   Problem: Elimination: Goal: Will not experience complications related to bowel motility Outcome: Progressing Goal: Will not experience complications related to urinary retention Outcome: Progressing   Problem: Pain Management: Goal: General experience of comfort will improve Outcome: Progressing   Problem: Safety: Goal: Ability to remain free from injury will improve Outcome: Progressing   Problem: Skin Integrity: Goal: Risk for impaired skin integrity will decrease Outcome: Progressing

## 2023-01-23 NOTE — TOC CM/SW Note (Signed)
 Transition of Care [facility]) - Inpatient Brief Assessment   Patient Details  Name: Edward [name] MRN: [id number] Date of Birth: [birth year]  Transition of Care [facility]) CM/SW Contact:    Dalila Camellia SAUNDERS, LCSW Phone Number: 01/23/2023, 6:53 PM   Clinical Narrative:  Patient has insurance and a PCP.  Patient does not have any SDOH needs, no anticipated TOC needs at this time.    Transition of Care Asessment: Insurance and Status: Insurance coverage has been reviewed Patient has primary care physician: Yes Home environment has been reviewed: Lives with wife Prior level of function:: Indep walks with cane. Prior/Current Home Services: No current home services Social Drivers of Health Review: SDOH reviewed no interventions necessary Readmission risk has been reviewed: Yes Transition of care needs: no transition of care needs at this time

## 2023-01-23 NOTE — Progress Notes (Signed)
 Pharmacy Antibiotic Note  Edward [name] is a 83 y.o. male admitted on 01/21/2023 with left lower extremity pain and infection. Patient reports taking doxycycline  PTA for infection. Reports some skin peeling and drainage. Pharmacy has been consulted for vancomycin  dosing.  Today, 01/23/2023: D3 Vanc + Rocephin  + Flagyl  Remains afebrile WBC normalized and stable CKD4; baseline SCr not entirely clear; down significantly overnight and appears to be lower than previous baseline CT shows soft tissue inflammation without definitive evidence of osteo; signs of possible superimposed gout  Plan: Increase vancomycin  to 1500 mg IV q48 hr (eAUC 540 w/ SCr 2.62; Vd 0.5) Continue ceftriaxone  + metronidazole  per MD  Height: 5' 10 (177.8 cm) Weight: 117 kg (258 lb) IBW/kg (Calculated) : 73  Temp (24hrs), Avg:97.2 F (36.2 C), Min:96.6 F (35.9 C), Max:97.7 F (36.5 C)  Recent Labs  Lab 01/21/23 1037 01/21/23 1039 01/22/23 0434 01/23/23 0433  WBC 10.4  --  9.2 8.4  CREATININE 3.37*  --  3.31* 2.62*  LATICACIDVEN  --  1.8  --   --     Estimated Creatinine Clearance: 28.8 mL/min (A) (by C-G formula based on SCr of 2.62 mg/dL (H)).    Allergies  Allergen Reactions   Lipitor [Atorvastatin Calcium ] Other (See Comments)    Weakness/ pain in legs    Antimicrobials this admission: Vancomycin  1/6 >> Ceftriaxone  1/6 >> Metronidazole  1/6 >>  Dose adjustments this admission:  1/8 increase vanc 1000 >> 1500 mg q48 w/ improved SCr  Microbiology results: 1/6 BCx: ngtd   Thank you for allowing pharmacy to be a part of this patient's care.  MARK BARD LABOR, PharmD, BCPS Clinical Pharmacist 01/23/2023 9:35 AM

## 2023-01-24 DIAGNOSIS — L03115 Cellulitis of right lower limb: Secondary | ICD-10-CM | POA: Diagnosis not present

## 2023-01-24 LAB — BASIC METABOLIC PANEL
Anion gap: 10 (ref 5–15)
BUN: 63 mg/dL — ABNORMAL HIGH (ref 8–23)
CO2: 26 mmol/L (ref 22–32)
Calcium: 8.2 mg/dL — ABNORMAL LOW (ref 8.9–10.3)
Chloride: 98 mmol/L (ref 98–111)
Creatinine, Ser: 3.35 mg/dL — ABNORMAL HIGH (ref 0.61–1.24)
GFR, Estimated: 18 mL/min — ABNORMAL LOW (ref >60)
Glucose, Bld: 138 mg/dL — ABNORMAL HIGH (ref 70–99)
Potassium: 4.7 mmol/L (ref 3.5–5.1)
Sodium: 134 mmol/L — ABNORMAL LOW (ref 135–145)

## 2023-01-24 LAB — GLUCOSE, CAPILLARY
Glucose-Capillary: 114 mg/dL — ABNORMAL HIGH (ref 70–99)
Glucose-Capillary: 104 mg/dL — ABNORMAL HIGH (ref 70–99)
Glucose-Capillary: 99 mg/dL (ref 70–99)
Glucose-Capillary: 130 mg/dL — ABNORMAL HIGH (ref 70–99)

## 2023-01-24 LAB — CBC
HCT: 37.7 % — ABNORMAL LOW (ref 39.0–52.0)
Hemoglobin: 11.1 g/dL — ABNORMAL LOW (ref 13.0–17.0)
MCH: 24.9 pg — ABNORMAL LOW (ref 26.0–34.0)
MCHC: 29.4 g/dL — ABNORMAL LOW (ref 30.0–36.0)
MCV: 84.7 fL (ref 80.0–100.0)
Platelets: 226 10*3/uL (ref 150–400)
RBC: 4.45 MIL/uL (ref 4.22–5.81)
RDW: 23.6 % — ABNORMAL HIGH (ref 11.5–15.5)
WBC: 11.1 10*3/uL — ABNORMAL HIGH (ref 4.0–10.5)
nRBC: 0 % (ref 0.0–0.2)

## 2023-01-24 LAB — MAGNESIUM: Magnesium: 2.3 mg/dL (ref 1.7–2.4)

## 2023-01-24 NOTE — Progress Notes (Signed)
 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  Also noted to have abdominal distention.  Upon admission noted to have some signs of volume overload and started on IV antibiotics for possible cellulitis.  Patient underwent paracentesis on 1/6, 6 L was removed.  Currently slowly improving on IV Rocephin .  I am hopeful we can switch it to p.o. tomorrow and discharge him early.   Assessment & Plan:  Principal Problem:   Cellulitis of right lower extremity Active Problems:   Cirrhosis of liver (HCC)   PAF (paroxysmal atrial fibrillation) (HCC)   Diabetes mellitus (HCC)   Chronic diastolic CHF (congestive heart failure) (HCC)   CKD (chronic kidney disease) stage 4, GFR 15-29 ml/min (HCC)   Benign essential hypertension   Coronary artery disease   Iron  deficiency anemia   Gout    Cellulitis of right lower extremity with chronic skin changes Outpatient p.o. doxycycline , failed treatment.  Change Vanc/Roc/Flagyl  > Rocephin . WOC following.  24 more hours of IV antibiotics thereafter Linezolid  PO  CT of the right lower extremity shows moderate cellulitic changes with lots of chronic changes.  No clear evidence of osteomyelitis or abscess formation but should keep a close eye out on this area and have a close follow-up.  ABIs are overall unremarkable.   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  Supportive care, monitor. Status post  albumin  Follows outpatient [facility]   Diarrhea mellitus type II Peripheral neuropathy Slight scale and Accu-Chek.  Will resume home medications as appropriate Continue gabapentin      PAF (paroxysmal atrial fibrillation) (HCC) Not on anticoagulation.  Prior history of GI bleed Continue carvedilol  for rate control.   CAD s/p CABG   Chronic diastolic CHF (congestive heart failure) (HCC) Peripheral vascular disease Currently chest pain-free. Hold lasix  today due to rising Cr, Cont Coreg .      CKD (chronic kidney disease) stage 4, GFR 15-29 ml/min (HCC) Baseline Cr 2.6-3.4. Will hold lasix  today as Cr trended up.      Benign essential hypertension Continue antihypertensives as above.  IV as needed     Iron  deficiency anemia Stable, Transfuse PRBC as needed.     Gout Continue allopurinol  150 mg 3 times a week.  PT- No follow up.    DVT prophylaxis: SCDs Start: 01/21/23 1228    Code Status: Full Code Family Communication: Spouse at bedside Status is: Inpatient Remains inpatient appropriate because: Hoping for 24 more hours of IV antibiotics.   Subjective: Feeling better, pain and swelling improving.  Patient's wife and wound care team at bedside.  Examination:  General exam: Appears calm and comfortable  Respiratory system: Clear to auscultation. Respiratory effort normal. Cardiovascular system: S1 & S2 heard, RRR. No JVD, murmurs, rubs, gallops or clicks. No pedal edema. Gastrointestinal system: Abdomen is nondistended, soft and nontender. No organomegaly or masses felt. Normal bowel sounds heard. Central nervous system: Alert and oriented. No focal neurological deficits. Extremities: Symmetric 5 x 5 power. Skin: Right  lower extremity dressing already in place but according to wound care this is already improving Psychiatry: Judgement and insight appear normal. Mood & affect appropriate.                Diet Orders (From admission, onward)     Start      Ordered   01/21/23 1228  Diet heart healthy/carb modified Room service appropriate? Yes; Fluid consistency: Thin; Fluid restriction: 1200 mL Fluid  Diet effective now       Question Answer Comment  Diet-HS Snack? Nothing   Room service appropriate? Yes   Fluid consistency: Thin   Fluid restriction: 1200 mL Fluid      01/21/23 1230            Objective: Vitals:   01/23/23 0520 01/23/23 1250 01/23/23 2135 01/24/23 0314  BP: 111/67 121/72 (!) 115/55 138/68  Pulse: 67 75 65 69  Resp: 16 18 20 18   Temp: 97.7 F (36.5 C) (!) 97.5 F (36.4 C) (!) 97.5 F (36.4 C) 98.1 F (36.7 C)  TempSrc: Oral     SpO2: 97% 100% 95% 97%  Weight:      Height:        Intake/Output Summary (Last 24 hours) at 01/24/2023 1154 Last data filed at 01/24/2023 0900 Gross per 24 hour  Intake 2147.71 ml  Output --  Net 2147.71 ml   Filed Weights   01/21/23 0936  Weight: 117 kg    Scheduled Meds:  allopurinol   150 mg Oral Q M,W,F   B-complex with vitamin C  1 tablet Oral Daily   carvedilol   6.25 mg Oral BID   gabapentin   200 mg Oral Daily   gabapentin   300 mg Oral QHS   leptospermum manuka honey  1 Application Topical Daily   levothyroxine   25 mcg Oral Q0600   multivitamin with minerals  1 tablet Oral Daily   pantoprazole   40 mg Oral BID   Continuous Infusions:  cefTRIAXone  (ROCEPHIN )  IV 200 mL/hr at 01/23/23 2135    Nutritional status Signs/Symptoms: estimated needs Interventions: MVI, Refer to RD note for recommendations Body mass index is 37.02 kg/m.  Data Reviewed:   CBC: Recent Labs  Lab 01/21/23 1037 01/22/23 0434 01/23/23 0433 01/24/23 0425  WBC 10.4 9.2 8.4 11.1*  NEUTROABS 7.8*  --   --   --   HGB 11.2* 10.6* 10.7* 11.1*  HCT 37.1* 36.4* 36.6* 37.7*  MCV 84.3 85.4 85.5 84.7  PLT 219 212 214 226   Basic Metabolic Panel: Recent Labs  Lab 01/21/23 1037 01/22/23 0434 01/23/23 0433 01/24/23 0425  NA 136 132* 130* 134*  K 4.5 4.3 4.1 4.7  CL 99 97* 96* 98  CO2  27 26 24 26   GLUCOSE 68* 77 81 138*  BUN 64* 64* 61* 63*  CREATININE 3.37* 3.31* 2.62* 3.35*  CALCIUM  8.5* 7.8* 7.9* 8.2*  MG  --   --  2.5* 2.3  PHOS  --   --  3.5  --    GFR: Estimated Creatinine Clearance: 22.5 mL/min (A) (by C-G formula based on SCr of 3.35 mg/dL (H)). Liver Function Tests: Recent Labs  Lab 01/21/23 1037 01/22/23 0434  AST 27 23  ALT 14 12  ALKPHOS 132* 109  BILITOT 1.6* 1.6*  PROT 7.5 6.4*  ALBUMIN  2.6* 2.2*   No results for input(s): LIPASE, AMYLASE in the last 168 hours. No results for input(s): AMMONIA in the last 168 hours. Coagulation Profile: Recent Labs  Lab  01/21/23 1037  INR 1.4*   Cardiac Enzymes: No results for input(s): CKTOTAL, CKMB, CKMBINDEX, TROPONINI in the last 168 hours. BNP (last 3 results) No results for input(s): PROBNP in the last 8760 hours. HbA1C: No results for input(s): HGBA1C in the last 72 hours. CBG: Recent Labs  Lab 01/23/23 0808 01/23/23 1215 01/23/23 1641 01/23/23 2133 01/24/23 0745  GLUCAP 78 104* 135* 119* 114*   Lipid Profile: No results for input(s): CHOL, HDL, LDLCALC, TRIG, CHOLHDL, LDLDIRECT in the last 72 hours. Thyroid  Function Tests: No results for input(s): TSH, T4TOTAL, FREET4, T3FREE, THYROIDAB in the last 72 hours. Anemia Panel: No results for input(s): VITAMINB12, FOLATE, FERRITIN, TIBC, IRON , RETICCTPCT in the last 72 hours. Sepsis Labs: Recent Labs  Lab 01/21/23 1039  LATICACIDVEN 1.8    Recent Results (from the past 240 hours)  Blood culture (routine x 2)     Status: None (Preliminary result)   Collection Time: 01/21/23 10:47 AM   Specimen: BLOOD  Result Value Ref Range Status   Specimen Description   Final    BLOOD LEFT ANTECUBITAL Performed at [facility], [address] [place], [place] [address]    Special Requests   Final    BOTTLES DRAWN AEROBIC AND ANAEROBIC Blood Culture results may not be optimal  due to an inadequate volume of blood received in culture bottles Performed at [facility], [address] [place], [place] [address]    Culture   Final    NO GROWTH 3 DAYS Performed at [facility], [address] [place], [place] [address]    Report Status PENDING  Incomplete  Blood culture (routine x 2)     Status: None (Preliminary result)   Collection Time: 01/21/23 10:48 AM   Specimen: BLOOD  Result Value Ref Range Status   Specimen Description   Final    BLOOD LEFT ANTECUBITAL Performed at [facility], [address], [place], [place] [address]    Special Requests   Final    BOTTLES DRAWN AEROBIC AND ANAEROBIC Blood Culture results may not be optimal due to an inadequate volume of blood received in culture bottles Performed at [facility], [address], [place], [place] [address]    Culture   Final    NO GROWTH 3 DAYS Performed at [facility], [address] [place], [place] [address]    Report Status PENDING  Incomplete         Radiology Studies: CT TIBIA FIBULA RIGHT WO CONTRAST Result Date: 01/22/2023 CLINICAL DATA:  Soft tissue mass, lower leg, deep. Cellulitis of right lower extremity with chronic skin changes. Fell doxycycline  treatment. Currently on broad-spectrum IV antibiotics. Concerns for deeper infection. EXAM: CT OF THE LOWER RIGHT EXTREMITY WITHOUT CONTRAST TECHNIQUE: Multidetector CT imaging of the right lower extremity was performed according to the standard protocol. RADIATION DOSE REDUCTION: This exam was performed according to the departmental dose-optimization program which includes automated exposure control, adjustment of the mA and/or kV according to patient size and/or use of iterative reconstruction technique. COMPARISON:  Right tibia and fibula radiographs 01/21/2023, right knee radiographs 01/10/2014 FINDINGS: Bones/Joint/Cartilage There is moderate patellar subchondral  cystic change. Moderate peripheral patellar degenerative osteophytes. There appear to be punched out, lytic erosions with thin sclerotic borders within the medial aspect of medial femoral condyle and region measuring up to 1.7 cm in AP dimension, 1.8 cm in craniocaudal dimension, and 0.4 cm in transverse depth (coronal series  6, image 73 and axial series 7, image 24). This is similar to the erosions seen within the right foot and ankle on today's CT and raises the question of gout. Moderate medial compartment of the knee joint space narrowing. Subchondral cystic changes versus erosions within the posterior non weight-bearing medial femoral condyle (sagittal series 8 images 82 through 89). Ligaments Suboptimally assessed by CT. Muscles and Tendons Mild fatty infiltration of the medial head of the gastrocnemius muscle. No gross tendon tear is visualized. Soft tissues Partially visualized mild-to-moderate joint effusion at the knee. Moderate diffuse calf subcutaneous fat edema and swelling. Moderate diffuse skin thickening. Mild irregularity of the anterior shin skin surface (axial series 9 images 121 through 230). Moderate to high-grade atherosclerotic calcifications. IMPRESSION: 1. Moderate diffuse calf subcutaneous fat edema and swelling. Moderate diffuse skin thickening. Mild irregularity of the anterior shin skin surface. These findings are compatible with cellulitis and/or chronic venous insufficiency. Recommend clinical correlation 2. Partially visualized mild-to-moderate joint effusion at the knee. 3. There appear to be punched out, lytic erosions with thin sclerotic borders within the medial aspect of medial femoral condyle in a region measuring up to 1.7 cm in AP dimension, 1.8 cm in craniocaudal dimension, and 0.4 cm in transverse depth. This is similar to the erosions seen within the right and ankle foot on today's CT and again raises the question of gout. Recommend clinical correlation. 4. Moderate  medial compartment of the knee joint space narrowing. Subchondral cystic changes versus erosions within the posterior non weight-bearing medial femoral condyle. 5. No soft tissue ulcer. No definite CT evidence of acute osteomyelitis. Electronically Signed   By: Tanda Lyons M.D.   On: 01/22/2023 16:39   CT FOOT RIGHT WO CONTRAST Result Date: 01/22/2023 CLINICAL DATA:  Deep foot soft tissue mass. Cellulitis of right lower extremity with chronic skin changes. Failed p.o. doxycycline  treatment. Currently on broad-spectrum IV antibiotics as inpatient. Concerns for deeper infection. EXAM: CT OF THE RIGHT FOOT WITHOUT CONTRAST TECHNIQUE: Multidetector CT imaging of the right foot was performed according to the standard protocol. Multiplanar CT image reconstructions were also generated. RADIATION DOSE REDUCTION: This exam was performed according to the departmental dose-optimization program which includes automated exposure control, adjustment of the mA and/or kV according to patient size and/or use of iterative reconstruction technique. COMPARISON:  Right tibia and fibula radiographs 01/21/2023 FINDINGS: Bones/Joint/Cartilage There is moderate erosion along an approximate 7 mm length of a distal aspect of the middle phalanx and proximal aspect of the distal phalanx of the fourth toe (coronal series 7, image 95 and sagittal series 8, image 57). There is mild calcification within the periphery of this erosion. There is high-grade fragmentation/erosion of the medial great toe metatarsophalangeal joint sesamoid (sagittal images 97-98 and coronal series 7 images 94 through 98). There are two small adjacent lucent punched out, lytic erosions with thin sclerotic borders, measuring up to 4 mm individually (coronal series 7, image 86 and sagittal images 99 through 101) within the dorsomedial aspect of the great toe metatarsal head. There is mild soft tissue swelling of the surrounding predominantly medial aspect of the great  toe metatarsal head. This raises the question of gout, which also may be the cause for fragmentation of the medial great toe metatarsophalangeal joint sesamoid. There are additional well-circumscribed lytic erosions with thin sclerotic borders within the midfoot and hindfoot, including within the lateral base of the fourth metatarsal (series 7, image 91 and series 8, image 54), proximal dorsal medial aspect of the  fifth metatarsal (series 8, image 31 and series 7, image 87), moderate to high-grade erosions within the second and third tarsometatarsal joints, mild erosion within the distal medial aspect of the medial cuneiform at the first tarsometatarsal joint (series 7, image 72). Moderate erosion at the posteromedial aspect of medial cuneiform at the articulation with the navicular and intermediate cuneiform (series 8, image 78 and series 7, image 70). There is moderate erosion at the far medial aspect of the talar body extending towards the posterosuperior medial talar dome with mild erosions within the adjacent medial malleolus (coronal series 6 images 135 through 145, sagittal series 8, image 78). a mild-to-moderate erosions within the posterolateral distal tibia and adjacent fibula (coronal series 6, image 151). Ligaments Suboptimally assessed by CT. Muscles and Tendons There is diffuse moderate to high-grade atrophy and fatty infiltration of the intrinsic plantar and dorsal foot musculature. Soft tissues No soft tissue ulcer is identified. There is moderate diffuse soft tissue swelling and subcutaneous fat edema, greatest within the dorsal midfoot to forefoot. No definite walled-off abscess is seen. Mild-to-moderate atherosclerotic calcifications. IMPRESSION: 1. Moderate diffuse soft tissue swelling and subcutaneous fat edema, greatest within the dorsal midfoot to forefoot. This is compatible with cellulitis. Recommend clinical correlation. No definite walled-off abscess is seen. 2. There are punched out,  lytic erosions within the medial aspect of the great toe metatarsal head as well as numerous portions of the tarsometatarsal joints, cuneiforms, and medial talus and adjacent medialis, and the posterior lateral tibia and adjacent fibula articulation. This raises the question of gout, which also may be the cause for fragmentation of the medial great toe metatarsophalangeal joint sesamoid. Erosion from osteomyelitis are felt less likely. 3. There is erosion with peripheral calcification seen in the region of the middle phalanx of the fourth toe extending into the adjacent distal phalanx. This also may be secondary to a crystalline arthropathy such as gout. Osteomyelitis can have a similar appearance, however there is no soft tissue ulcer in this region to suggest infection. Recommend clinical correlation. Electronically Signed   By: Tanda Lyons M.D.   On: 01/22/2023 16:31           LOS: 3 days   Time spent= 35 mins    Burgess JAYSON Dare, MD [facility]  If 7PM-7AM, please contact night-coverage  01/24/2023, 11:54 AM

## 2023-01-25 DIAGNOSIS — I5032 Chronic diastolic (congestive) heart failure: Secondary | ICD-10-CM

## 2023-01-25 DIAGNOSIS — I25810 Atherosclerosis of coronary artery bypass graft(s) without angina pectoris: Secondary | ICD-10-CM

## 2023-01-25 DIAGNOSIS — I10 Essential (primary) hypertension: Secondary | ICD-10-CM | POA: Diagnosis not present

## 2023-01-25 DIAGNOSIS — Z794 Long term (current) use of insulin: Secondary | ICD-10-CM

## 2023-01-25 DIAGNOSIS — R188 Other ascites: Secondary | ICD-10-CM

## 2023-01-25 DIAGNOSIS — L03115 Cellulitis of right lower limb: Secondary | ICD-10-CM | POA: Diagnosis not present

## 2023-01-25 DIAGNOSIS — K7460 Unspecified cirrhosis of liver: Secondary | ICD-10-CM | POA: Diagnosis not present

## 2023-01-25 DIAGNOSIS — E119 Type 2 diabetes mellitus without complications: Secondary | ICD-10-CM

## 2023-01-25 DIAGNOSIS — I480 Paroxysmal atrial fibrillation: Secondary | ICD-10-CM

## 2023-01-25 DIAGNOSIS — N184 Chronic kidney disease, stage 4 (severe): Secondary | ICD-10-CM

## 2023-01-25 LAB — BASIC METABOLIC PANEL
Anion gap: 14 (ref 5–15)
BUN: 57 mg/dL — ABNORMAL HIGH (ref 8–23)
CO2: 25 mmol/L (ref 22–32)
Calcium: 8.3 mg/dL — ABNORMAL LOW (ref 8.9–10.3)
Chloride: 96 mmol/L — ABNORMAL LOW (ref 98–111)
Creatinine, Ser: 3.51 mg/dL — ABNORMAL HIGH (ref 0.61–1.24)
GFR, Estimated: 17 mL/min — ABNORMAL LOW (ref >60)
Glucose, Bld: 98 mg/dL (ref 70–99)
Potassium: 4.4 mmol/L (ref 3.5–5.1)
Sodium: 135 mmol/L (ref 135–145)

## 2023-01-25 LAB — CBC
HCT: 36.5 % — ABNORMAL LOW (ref 39.0–52.0)
Hemoglobin: 11.2 g/dL — ABNORMAL LOW (ref 13.0–17.0)
MCH: 25.3 pg — ABNORMAL LOW (ref 26.0–34.0)
MCHC: 30.7 g/dL (ref 30.0–36.0)
MCV: 82.6 fL (ref 80.0–100.0)
Platelets: 252 10*3/uL (ref 150–400)
RBC: 4.42 MIL/uL (ref 4.22–5.81)
RDW: 23.7 % — ABNORMAL HIGH (ref 11.5–15.5)
WBC: 9.3 10*3/uL (ref 4.0–10.5)
nRBC: 0 % (ref 0.0–0.2)

## 2023-01-25 LAB — MAGNESIUM: Magnesium: 2.3 mg/dL (ref 1.7–2.4)

## 2023-01-25 LAB — GLUCOSE, CAPILLARY: Glucose-Capillary: 126 mg/dL — ABNORMAL HIGH (ref 70–99)

## 2023-01-25 MED ORDER — SENNOSIDES-DOCUSATE SODIUM 8.6-50 MG PO TABS: 1.0000 | ORAL_TABLET | Freq: Every evening | ORAL | 0 refills | Status: DC | PRN

## 2023-01-25 MED ORDER — ONDANSETRON HCL 4 MG PO TABS: 4.0000 mg | ORAL_TABLET | Freq: Four times a day (QID) | ORAL | 0 refills | Status: DC | PRN

## 2023-01-25 MED ORDER — LINEZOLID 600 MG PO TABS: 600.0000 mg | ORAL_TABLET | Freq: Two times a day (BID) | ORAL | 0 refills | Status: AC

## 2023-01-25 MED ORDER — OXYCODONE HCL 5 MG PO TABS: 5.0000 mg | ORAL_TABLET | Freq: Four times a day (QID) | ORAL | 0 refills | Status: AC | PRN

## 2023-01-25 MED ORDER — MEDIHONEY WOUND/BURN DRESSING EX PSTE: 1.0000 | PASTE | Freq: Every day | CUTANEOUS | 0 refills | Status: DC

## 2023-01-25 MED ORDER — LINEZOLID 600 MG PO TABS
600.0000 mg | ORAL_TABLET | Freq: Two times a day (BID) | ORAL | Status: DC
  Administered 2023-01-25: 600 mg via ORAL
  Filled 2023-01-25: qty 1

## 2023-01-25 MED ORDER — ACETAMINOPHEN 500 MG PO TABS: 500.0000 mg | ORAL_TABLET | Freq: Four times a day (QID) | ORAL | 0 refills | Status: DC | PRN

## 2023-01-25 NOTE — Progress Notes (Signed)
 Patient refused dressing change at this time. Will continue to maintain all safety precautions.

## 2023-01-25 NOTE — Discharge Summary (Signed)
 Physician Discharge Summary   Patient: Edward [name] MRN: [id number] DOB: [birth year]  Admit date:     01/21/2023  Discharge date: 01/25/23  Discharge Physician: Alejandro Marker, DO   PCP: [facility]   Recommendations at discharge:   Follow-up with PCP within 1 to 2 weeks repeat CBC, CMP, mag, Phos within 1 week Follow-up in the outpatient setting with gastroenterology for cirrhosis of the liver and continue with paracentesis in outpatient setting if needed Follow-up with infectious disease in outpatient setting if necessary and continue Linezolid  for 7-day course   Discharge Diagnoses: Principal Problem:   Cellulitis of right lower extremity Active Problems:   Cirrhosis of liver (HCC)   PAF (paroxysmal atrial fibrillation) (HCC)   Diabetes mellitus (HCC)   Chronic diastolic CHF (congestive heart failure) (HCC)   CKD (chronic kidney disease) stage 4, GFR 15-29 ml/min (HCC)   Benign essential hypertension   Coronary artery disease   Iron  deficiency anemia   Gout  Resolved Problems:   * No resolved hospital problems. 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  Also noted to have abdominal distention.  Upon admission noted to have some signs of volume overload and started on IV antibiotics for possible cellulitis.  Patient underwent paracentesis on 1/6, 6 L was removed.  Currently slowly improving on IV Rocephin  and legs improved so we will change to oral linezolid  and patient is stable for discharge and will need to follow-up with PCP and ID in outpatient setting as well as GI.  Assessment & Plan:   Principal Problem:   Cellulitis of right lower extremity Active Problems:   Cirrhosis of liver (HCC)   PAF (paroxysmal atrial fibrillation) (HCC)   Diabetes mellitus (HCC)   Chronic diastolic CHF (congestive heart failure) (HCC)   CKD (chronic kidney disease) stage 4, GFR 15-29 ml/min (HCC)   Benign essential hypertension   Coronary artery disease   Iron  deficiency anemia   Gout  Cellulitis of right lower extremity with chronic skin changes -Outpatient p.o. doxycycline , failed treatment.  Change Vanc/Roc/Flagyl  > Rocephin .  -WOC following.  24 more hours of IV antibiotics thereafter Linezolid  PO  -CT of the right lower extremity shows moderate cellulitic changes with lots of chronic changes.  No clear evidence of osteomyelitis or abscess formation but should keep a close eye out on this area and have a close follow-up.  ABIs are overall unremarkable. 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  Supportive care, monitor. -Status post albumin  -Follows outpatient [facility] in the outpatient setting and resume Diuretics    Diarrhea mellitus type II Peripheral neuropathy -Slight scale and Accu-Chek.  Will resume home medications as appropriate -Continue gabapentin  -CBG Trend: Recent Labs  Lab 01/23/23 1641 01/23/23 2133 01/24/23 0745 01/24/23 1223 01/24/23 1656 01/24/23 2011 01/25/23 0854  GLUCAP 135* 119* 114* 104* 99 130* 126*    PAF (paroxysmal atrial fibrillation) (HCC) -  Not on anticoagulation.  Prior history of GI bleed -Continue carvedilol  for rate control.   CAD s/p CABG Chronic diastolic CHF (congestive heart failure) (HCC) Peripheral vascular disease -Currently chest pain-free. -Resumed Coreg  and will resume  his Lasix  later in outpatient setting with close PCP follow-up    CKD (chronic kidney disease) stage 4, GFR 15-29 ml/min (HCC) -Baseline Cr 2.6-3.4. Will hold lasix  today as Cr trended up.  -BUN/Cr Trend: Recent Labs  Lab 01/11/23 1219 01/21/23 1037 01/22/23 0434 01/23/23 0433 01/24/23 0425 01/25/23 0434  BUN 62* 64* 64* 61* 63* 57*  CREATININE 3.35* 3.37* 3.31* 2.62* 3.35* 3.51*  -Avoid Nephrotoxic Medications, Contrast Dyes, Hypotension and Dehydration to Ensure Adequate Renal Perfusion and will need to Renally Adjust Meds -Continue to Monitor and Trend Renal Function carefully and repeat CMP within 1 week  Benign Essential Hypertension -Continue antihypertensives as above.   -IV as needed -Follow up with PCP within 1-2 weeks   Iron  deficiency anemia -Stable, Transfuse PRBC as needed. -Hgb/Hct Trend: Recent Labs  Lab 01/11/23 1219 01/21/23 1037 01/22/23 0434 01/23/23 0433 01/24/23 0425 01/25/23 0434  HGB 11.0* 11.2* 10.6* 10.7* 11.1* 11.2*  HCT 36.3* 37.1* 36.4* 36.6* 37.7* 36.5*  MCV 81.6 84.3 85.4 85.5 84.7 82.6  -Check Anemia Panel in the outpatient and continue to Monitor for S/Sx of Bleeding and repeat CBC within 1 week  Gout Continue allopurinol  150 mg 3 times a week.  Class II Obesity -Complicates overall prognosis and care -Estimated body mass index is 37.02 kg/m as calculated from the following:   Height as of this encounter: 5' 10 (1.778 m).   Weight as of this encounter: 117 kg.  -Weight Loss and Dietary Counseling given  PT- No follow up.   Nutrition Documentation    Flowsheet Row ED to Hosp-Admission (Discharged) from 01/21/2023 in [facility]  Nutrition Problem Increased nutrient needs  Etiology chronic illness  [cirrhosis, CHF, CKD, DM]  Nutrition Goal Patient will meet greater than or equal to 90% of their needs  Interventions MVI, Refer to RD note for recommendations      Consultants: Discussed  with ID Procedures performed: Paracentesis   Disposition: Home health Diet recommendation:  Discharge Diet Orders (From admission, onward)     Start     Ordered   01/25/23 0000  Diet - low sodium heart healthy        01/25/23 1259   01/25/23 0000  Diet Carb Modified        01/25/23 1259           Cardiac and Carb modified diet DISCHARGE MEDICATION: Allergies as of 01/25/2023       Reactions   Lipitor [atorvastatin Calcium ] Other (See Comments)   Weakness/ pain in legs        Medication List     STOP taking these medications    doxycycline  100 MG tablet Commonly known as: VIBRA -TABS       TAKE these medications    acetaminophen  500 MG tablet Commonly known as: TYLENOL  Take 1 tablet (500 mg total) by mouth every 6 (six) hours as needed for mild pain (pain score 1-3) (or Fever >/= 101). What changed:  medication strength how much to take when to take this reasons to take this   allopurinol  300 MG tablet Commonly known as: ZYLOPRIM  Take 150 mg by mouth every Monday, Wednesday, and Friday.   ascorbic acid 500 MG tablet Commonly known as: VITAMIN C Take 500 mg  by mouth in the morning.   atorvastatin 40 MG tablet Commonly known as: LIPITOR Take 40 mg by mouth daily.   carvedilol  12.5 MG tablet Commonly known as: COREG  Take 6.25 mg by mouth 2 (two) times daily with a meal.   cetirizine 10 MG tablet Commonly known as: ZYRTEC Take 10 mg by mouth daily.   cyanocobalamin  1000 MCG tablet Take 1,000 mcg by mouth daily.   ferrous gluconate  324 MG tablet Commonly known as: FERGON Take 1 tablet (324 mg total) by mouth daily with breakfast.   furosemide  20 MG tablet Commonly known as: LASIX  Take 4 tablets (80 mg total) by mouth 2 (two) times daily.   gabapentin  100 MG capsule Commonly known as: NEURONTIN  Take 200-300 mg by mouth See admin instructions. Take 200 mg by mouth in the morning and 300 mg at bedtime   insulin  glargine 100 UNIT/ML  injection Commonly known as: LANTUS  Inject 48 Units into the skin in the morning.   leptospermum manuka honey Pste paste Apply 1 Application topically daily.   levothyroxine  25 MCG tablet Commonly known as: SYNTHROID  Take 25 mcg by mouth daily before breakfast.   linezolid  600 MG tablet Commonly known as: ZYVOX  Take 1 tablet (600 mg total) by mouth every 12 (twelve) hours for 7 days.   magnesium  oxide 400 (240 Mg) MG tablet Commonly known as: MAG-OX Take 1,000 mg by mouth at bedtime.   multivitamin capsule Take 1 capsule by mouth daily.   ondansetron  4 MG tablet Commonly known as: ZOFRAN  Take 1 tablet (4 mg total) by mouth every 6 (six) hours as needed for nausea.   oxyCODONE  5 MG immediate release tablet Commonly known as: Oxy IR/ROXICODONE  Take 1 tablet (5 mg total) by mouth every 6 (six) hours as needed for up to 3 days for severe pain (pain score 7-10).   pantoprazole  40 MG tablet Commonly known as: PROTONIX  Take 1 tablet (40 mg total) by mouth 2 (two) times daily.   Semaglutide (1 MG/DOSE) 2 MG/1.5ML Sopn Inject 1 mg into the skin every Sunday.   senna-docusate 8.6-50 MG tablet Commonly known as: Senokot-S Take 1 tablet by mouth at bedtime as needed for moderate constipation.   spironolactone  25 MG tablet Commonly known as: ALDACTONE  Take by mouth.   testosterone cypionate 200 MG/ML injection Commonly known as: DEPOTESTOSTERONE CYPIONATE Inject 0.8-1 mLs into the muscle every 14 (fourteen) days. Inject 160 mg   Vitamin D3 50 MCG (2000 UT) Tabs Take 2,000 Units by mouth daily with breakfast.   Zinc  50 MG Tabs Take 1 tablet (50 mg total) by mouth daily.               Discharge Care Instructions  (From admission, onward)           Start     Ordered   01/25/23 0000  Discharge wound care:       Comments: Clean R lower leg wound with Vashe wound cleanser, apply Medihoney to necrotic tissue, cover with dry gauze.  Apply Xeroform to surrounding  intact skin.  Cover entire posterior and anterior lower leg with ABD pads and wrap with Kerlix beginning right above toes and ending right below knees.  Secure with Ace bandage wrapped in same fashion.   01/25/23 1259            Discharge Exam: Filed Weights   01/21/23 0936  Weight: 117 kg   Vitals:   01/24/23 1928 01/25/23 0443  BP: 126/74 130/68  Pulse: 65  65  Resp: 15 18  Temp: 97.8 F (36.6 C) (!) 97.5 F (36.4 C)  SpO2: 100% 96%   Examination: Physical Exam:  Constitutional: WN/WD obese Caucasian chronically ill-appearing male in no acute distress Respiratory: Diminished to auscultation bilaterally, no wheezing, rales, rhonchi or crackles. Normal respiratory effort and patient is not tachypenic. No accessory muscle use.  Unlabored breathing Cardiovascular: RRR, no murmurs / rubs / gallops. S1 and S2 auscultated.  1+ lower extremity edema Abdomen: Soft, non-tender, distended secondary to body habitus.  Bowel sounds positive.  GU: Deferred. Musculoskeletal: No clubbing / cyanosis of digits/nails. No joint deformity upper and lower extremities.  Skin: Is wrapped Neurologic: CN 2-12 grossly intact with no focal deficits. Romberg sign and cerebellar reflexes not assessed.  Psychiatric: Normal judgment and insight. Alert and oriented x 3. Normal mood and appropriate affect.   Condition at discharge: stable  The results of significant diagnostics from this hospitalization (including imaging, microbiology, ancillary and laboratory) are listed below for reference.   Imaging Studies: VAS US  ABI WITH/WO TBI Result Date: 01/22/2023  LOWER EXTREMITY DOPPLER STUDY Patient Name:  JENTZEN MINASYAN  Date of Exam:   01/22/2023 Medical Rec #: [id number]        Accession #:    [id number] Date of Birth: 1940/03/26        Patient Gender: M Patient Age:   37 years Exam Location:  [facility] Procedure:      VAS US  ABI WITH/WO TBI Referring Phys: DAVID ORTIZ  --------------------------------------------------------------------------------  Indications: RLE wound/cellulitis High Risk Factors: Hypertension, hyperlipidemia, Diabetes, past history of                    smoking, coronary artery disease, prior CVA. Other Factors: CHF, CKD, Afib, PM, PVD, HX of CABG,.  Vascular Interventions: 03/06/2012 LLE CFA to BK pop bypass graft. Limitations: Today's exam was limited due to patient intolerant to cuff pressure              and an open wound. Comparison Study: Previous exam on 04/19/2022 Performing Technologist: Leigh Rom RVT, RDMS  Examination Guidelines: A complete evaluation includes at minimum, Doppler waveform signals and systolic blood pressure reading at the level of bilateral brachial, anterior tibial, and posterior tibial arteries, when vessel segments are accessible. Bilateral testing is considered an integral part of a complete examination. Photoelectric Plethysmograph (PPG) waveforms and toe systolic pressure readings are included as required and additional duplex testing as needed. Limited examinations for reoccurring indications may be performed as noted.  ABI Findings: +---------+------------------+-----+---------+--------+ Right    Rt Pressure (mmHg)IndexWaveform Comment  +---------+------------------+-----+---------+--------+ Brachial 136                    triphasic         +---------+------------------+-----+---------+--------+ PTA                             biphasic        +---------+------------------+-----+---------+--------+ DP                              biphasic        +---------+------------------+-----+---------+--------+ Great Toe88                0.65 Abnormal          +---------+------------------+-----+---------+--------+ +---------+------------------+-----+---------+-------+ Left     Lt Pressure (mmHg)IndexWaveform Comment +---------+------------------+-----+---------+-------+ Brachial 129  triphasic        +---------+------------------+-----+---------+-------+ PTA      128               0.94 biphasic         +---------+------------------+-----+---------+-------+ DP       129               0.95 biphasic         +---------+------------------+-----+---------+-------+ Great Toe90                0.66 Normal           +---------+------------------+-----+---------+-------+ +-------+-----------+-----------+------------+------------+ ABI/TBIToday's ABIToday's TBIPrevious ABIPrevious TBI +-------+-----------+-----------+------------+------------+ Right             0.65       0.94        0.89         +-------+-----------+-----------+------------+------------+ Left   0.95       0.66       1.14        0.71         +-------+-----------+-----------+------------+------------+  Unable to obtain ABIs of RLE due to patient pain intolerance and open wounds to calf.  Summary: Right: The right toe-brachial index is mildly abnormal. Left: Resting left ankle-brachial index is within normal range. The left toe-brachial index is mildly abnormal. *See table(s) above for measurements and observations.  Electronically signed by Gaile New MD on 01/22/2023 at 8:05:45 PM.    Final    CT TIBIA FIBULA RIGHT WO CONTRAST Result Date: 01/22/2023 CLINICAL DATA:  Soft tissue mass, lower leg, deep. Cellulitis of right lower extremity with chronic skin changes. Fell doxycycline  treatment. Currently on broad-spectrum IV antibiotics. Concerns for deeper infection. EXAM: CT OF THE LOWER RIGHT EXTREMITY WITHOUT CONTRAST TECHNIQUE: Multidetector CT imaging of the right lower extremity was performed according to the standard protocol. RADIATION DOSE REDUCTION: This exam was performed according to the departmental dose-optimization program which includes automated exposure control, adjustment of the mA and/or kV according to patient size and/or use of iterative reconstruction technique. COMPARISON:   Right tibia and fibula radiographs 01/21/2023, right knee radiographs 01/10/2014 FINDINGS: Bones/Joint/Cartilage There is moderate patellar subchondral cystic change. Moderate peripheral patellar degenerative osteophytes. There appear to be punched out, lytic erosions with thin sclerotic borders within the medial aspect of medial femoral condyle and region measuring up to 1.7 cm in AP dimension, 1.8 cm in craniocaudal dimension, and 0.4 cm in transverse depth (coronal series 6, image 73 and axial series 7, image 24). This is similar to the erosions seen within the right foot and ankle on today's CT and raises the question of gout. Moderate medial compartment of the knee joint space narrowing. Subchondral cystic changes versus erosions within the posterior non weight-bearing medial femoral condyle (sagittal series 8 images 82 through 89). Ligaments Suboptimally assessed by CT. Muscles and Tendons Mild fatty infiltration of the medial head of the gastrocnemius muscle. No gross tendon tear is visualized. Soft tissues Partially visualized mild-to-moderate joint effusion at the knee. Moderate diffuse calf subcutaneous fat edema and swelling. Moderate diffuse skin thickening. Mild irregularity of the anterior shin skin surface (axial series 9 images 121 through 230). Moderate to high-grade atherosclerotic calcifications. IMPRESSION: 1. Moderate diffuse calf subcutaneous fat edema and swelling. Moderate diffuse skin thickening. Mild irregularity of the anterior shin skin surface. These findings are compatible with cellulitis and/or chronic venous insufficiency. Recommend clinical correlation 2. Partially visualized mild-to-moderate joint effusion at the knee. 3. There appear to be punched out, lytic  erosions with thin sclerotic borders within the medial aspect of medial femoral condyle in a region measuring up to 1.7 cm in AP dimension, 1.8 cm in craniocaudal dimension, and 0.4 cm in transverse depth. This is similar  to the erosions seen within the right and ankle foot on today's CT and again raises the question of gout. Recommend clinical correlation. 4. Moderate medial compartment of the knee joint space narrowing. Subchondral cystic changes versus erosions within the posterior non weight-bearing medial femoral condyle. 5. No soft tissue ulcer. No definite CT evidence of acute osteomyelitis. Electronically Signed   By: Tanda Lyons M.D.   On: 01/22/2023 16:39   CT FOOT RIGHT WO CONTRAST Result Date: 01/22/2023 CLINICAL DATA:  Deep foot soft tissue mass. Cellulitis of right lower extremity with chronic skin changes. Failed p.o. doxycycline  treatment. Currently on broad-spectrum IV antibiotics as inpatient. Concerns for deeper infection. EXAM: CT OF THE RIGHT FOOT WITHOUT CONTRAST TECHNIQUE: Multidetector CT imaging of the right foot was performed according to the standard protocol. Multiplanar CT image reconstructions were also generated. RADIATION DOSE REDUCTION: This exam was performed according to the departmental dose-optimization program which includes automated exposure control, adjustment of the mA and/or kV according to patient size and/or use of iterative reconstruction technique. COMPARISON:  Right tibia and fibula radiographs 01/21/2023 FINDINGS: Bones/Joint/Cartilage There is moderate erosion along an approximate 7 mm length of a distal aspect of the middle phalanx and proximal aspect of the distal phalanx of the fourth toe (coronal series 7, image 95 and sagittal series 8, image 57). There is mild calcification within the periphery of this erosion. There is high-grade fragmentation/erosion of the medial great toe metatarsophalangeal joint sesamoid (sagittal images 97-98 and coronal series 7 images 94 through 98). There are two small adjacent lucent punched out, lytic erosions with thin sclerotic borders, measuring up to 4 mm individually (coronal series 7, image 86 and sagittal images 99 through 101) within  the dorsomedial aspect of the great toe metatarsal head. There is mild soft tissue swelling of the surrounding predominantly medial aspect of the great toe metatarsal head. This raises the question of gout, which also may be the cause for fragmentation of the medial great toe metatarsophalangeal joint sesamoid. There are additional well-circumscribed lytic erosions with thin sclerotic borders within the midfoot and hindfoot, including within the lateral base of the fourth metatarsal (series 7, image 91 and series 8, image 54), proximal dorsal medial aspect of the fifth metatarsal (series 8, image 31 and series 7, image 87), moderate to high-grade erosions within the second and third tarsometatarsal joints, mild erosion within the distal medial aspect of the medial cuneiform at the first tarsometatarsal joint (series 7, image 72). Moderate erosion at the posteromedial aspect of medial cuneiform at the articulation with the navicular and intermediate cuneiform (series 8, image 78 and series 7, image 70). There is moderate erosion at the far medial aspect of the talar body extending towards the posterosuperior medial talar dome with mild erosions within the adjacent medial malleolus (coronal series 6 images 135 through 145, sagittal series 8, image 78). a mild-to-moderate erosions within the posterolateral distal tibia and adjacent fibula (coronal series 6, image 151). Ligaments Suboptimally assessed by CT. Muscles and Tendons There is diffuse moderate to high-grade atrophy and fatty infiltration of the intrinsic plantar and dorsal foot musculature. Soft tissues No soft tissue ulcer is identified. There is moderate diffuse soft tissue swelling and subcutaneous fat edema, greatest within the dorsal midfoot to forefoot. No definite  walled-off abscess is seen. Mild-to-moderate atherosclerotic calcifications. IMPRESSION: 1. Moderate diffuse soft tissue swelling and subcutaneous fat edema, greatest within the dorsal midfoot  to forefoot. This is compatible with cellulitis. Recommend clinical correlation. No definite walled-off abscess is seen. 2. There are punched out, lytic erosions within the medial aspect of the great toe metatarsal head as well as numerous portions of the tarsometatarsal joints, cuneiforms, and medial talus and adjacent medialis, and the posterior lateral tibia and adjacent fibula articulation. This raises the question of gout, which also may be the cause for fragmentation of the medial great toe metatarsophalangeal joint sesamoid. Erosion from osteomyelitis are felt less likely. 3. There is erosion with peripheral calcification seen in the region of the middle phalanx of the fourth toe extending into the adjacent distal phalanx. This also may be secondary to a crystalline arthropathy such as gout. Osteomyelitis can have a similar appearance, however there is no soft tissue ulcer in this region to suggest infection. Recommend clinical correlation. Electronically Signed   By: Tanda Lyons M.D.   On: 01/22/2023 16:31   US  Paracentesis Result Date: 01/21/2023 INDICATION: Patient with history of right lower leg cellulitis, cirrhosis, heart failure, chronic kidney disease, coronary artery disease, diabetes, recurrent ascites. Request received for diagnostic and therapeutic paracentesis. EXAM: ULTRASOUND GUIDED DIAGNOSTIC AND THERAPEUTIC PARACENTESIS MEDICATIONS: 8 mL 1% lidocaine  COMPLICATIONS: None immediate. PROCEDURE: Informed written consent was obtained from the patient after a discussion of the risks, benefits and alternatives to treatment. A timeout was performed prior to the initiation of the procedure. Initial ultrasound scanning demonstrates a large amount of ascites within the right lower abdominal quadrant. The right lower abdomen was prepped and draped in the usual sterile fashion. 1% lidocaine  was used for local anesthesia. Following this, a 19 gauge, 10-cm, Yueh catheter was introduced. An ultrasound  image was saved for documentation purposes. The paracentesis was performed. The catheter was removed and a dressing was applied. The patient tolerated the procedure well without immediate post procedural complication. FINDINGS: A total of approximately 5.9 liters of yellow fluid was removed. Samples were sent to the laboratory as requested by the clinical team. IMPRESSION: Successful ultrasound-guided diagnostic and therapeutic paracentesis yielding 5.9 liters of peritoneal fluid. Performed by: Franky Kelsie RIGGERS Electronically Signed   By: Thom Hall M.D.   On: 01/21/2023 16:25   DG Chest 2 View Result Date: 01/21/2023 CLINICAL DATA:  Shortness of breath. EXAM: CHEST - 2 VIEW COMPARISON:  01/11/2023 FINDINGS: The pacer wires are stable. Stable surgical changes bypass surgery with left atrial appendage clip noted. The heart is within normal limits in size for age. The mediastinal and hilar contours are within normal limits. The central vascular congestion without overt pulmonary edema. Left pleural effusion is noted with overlying atelectasis. The bony thorax is intact. IMPRESSION: 1. Central vascular congestion without overt pulmonary edema. 2. Left pleural effusion with overlying atelectasis. Electronically Signed   By: MYRTIS Stammer M.D.   On: 01/21/2023 11:35   DG Tibia/Fibula Right Result Date: 01/21/2023 CLINICAL DATA:  Left lower extremity pain and infection. EXAM: RIGHT TIBIA AND FIBULA - 2 VIEW COMPARISON:  01/10/2014. FINDINGS: No acute fracture or dislocation. No aggressive osseous lesion. There are degenerative changes of the knee joint in the form of mildly reduced medial tibio-femoral compartment joint space, tibial spiking and osteophytosis. Ankle mortise appears intact. No focal soft tissue swelling. No radiopaque foreign bodies. IMPRESSION: *No acute osseous abnormality of the right leg. Electronically Signed   By: Ree Kimberlee HERO.D.  On: 01/21/2023 10:04   CT CHEST ABDOMEN PELVIS WO  CONTRAST Result Date: 01/11/2023 CLINICAL DATA:  Fall.  Blunt trauma. EXAM: CT CHEST, ABDOMEN AND PELVIS WITHOUT CONTRAST TECHNIQUE: Multidetector CT imaging of the chest, abdomen and pelvis was performed following the standard protocol without IV contrast. RADIATION DOSE REDUCTION: This exam was performed according to the departmental dose-optimization program which includes automated exposure control, adjustment of the mA and/or kV according to patient size and/or use of iterative reconstruction technique. COMPARISON:  CT abdomen/pelvis dated September 25, 2021. CT chest dated April 01, 2015. FINDINGS: CT CHEST FINDINGS Cardiovascular: Heart is mildly enlarged. No pericardial effusion. Atherosclerotic calcification of the thoracic aorta and coronary arteries. 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 Mediastinum/Nodes: No enlarged mediastinal, hilar, or axillary lymph nodes. Thyroid  gland, trachea, and esophagus demonstrate no significant findings. Lungs/Pleura: Small left pleural effusion with left basilar atelectasis. Mild centrilobular emphysema. No focal consolidation. No pneumothorax. Patchy tree-in-bud nodularity within the right upper lobe and left lower lobe. Musculoskeletal: No acute osseous abnormality. Intact median sternotomy. CT ABDOMEN PELVIS FINDINGS Hepatobiliary: No evidence of acute hepatic injury. Perihepatic ascites. Hepatic surface contour nodularity, compatible with known cirrhosis. No suspicious focal lesion identified, within the limits of an unenhanced exam. Status post cholecystectomy. No biliary dilatation. Pancreas: Unremarkable. Spleen: No evidence of acute splenic injury. Spleen is enlarged measuring 14.7 cm in craniocaudal dimension. Adrenals/Urinary Tract: No adrenal hemorrhage or renal injury identified. There is air within the nondependent aspect of the distended bladder. Stomach/Bowel: Stomach is within normal limits. Small bowel is grossly unremarkable. Diffuse colonic  diverticulosis without evidence of acute diverticulitis. No evidence of obstruction. Vascular/Lymphatic: The abdominal aorta is normal in caliber. Atherosclerotic calcifications of the abdominal aorta and its major branches. There are prominent retrocaval and para-aortic nodes including a 1.6 cm retrocaval node (series 2, image 87) and a 1.2 cm para-aortic node (series 2, image 84). Reproductive: Unremarkable. Other: Moderate volume of abdominopelvic ascites, increased compared to the prior exam, measuring simple fluid attenuation. No intraperitoneal free air. There is an upper ventral abdominal wall hernia containing ascitic fluid. Diffuse body wall anasarca. Musculoskeletal: No acute osseous abnormality. Similar appearance of superior endplate compression deformity of L3 with Schmorl's nodes. Multilevel degenerative changes of the lumbar spine, most pronounced at L3-L4 and L5-S1. IMPRESSION: 1. No acute traumatic findings in the chest, abdomen, or pelvis. 2. Small left pleural effusion. 3. Patchy tree-in-bud nodularity in the right upper lobe and left lower lobe is likely secondary to an infectious/inflammatory etiology. 4. Air within the nondependent aspect of the bladder could relate to recent instrumentation. 5. Cirrhosis with moderate volume abdominopelvic ascites. 6. Colonic diverticulosis without evidence of acute diverticulitis. 7. Prominent enlarged retrocaval and para-aortic lymph nodes are nonspecific and could be reactive. 8. Diffuse body wall anasarca. Aortic Atherosclerosis (ICD10-I70.0). Electronically Signed   By: Harrietta Sherry M.D.   On: 01/11/2023 14:08   CT Head Wo Contrast Result Date: 01/11/2023 CLINICAL DATA:  Provided history: Head trauma, minor. Neck trauma. Facial trauma, blunt. Additional history provided: Fall (with facial trauma). Right cheek laceration. Right-sided facial swelling. EXAM: CT HEAD WITHOUT CONTRAST CT MAXILLOFACIAL WITHOUT CONTRAST CT CERVICAL SPINE WITHOUT CONTRAST  TECHNIQUE: Multidetector CT imaging of the head, cervical spine, and maxillofacial structures were performed using the standard protocol without intravenous contrast. Multiplanar CT image reconstructions of the cervical spine and maxillofacial structures were also generated. RADIATION DOSE REDUCTION: This exam was performed according to the departmental dose-optimization program which includes automated exposure control, adjustment of  the mA and/or kV according to patient size and/or use of iterative reconstruction technique. COMPARISON:  Prior head CT examinations 08/18/2022 and earlier. Cervical spine radiographs 08/18/2015. FINDINGS: CT HEAD FINDINGS Brain: Mild generalized parenchymal volume loss. 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 5.7 x 4.3 x 2.0 cm extra-axial mass overlying the left parietal lobe, unchanged in size from the prior head CT of 08/18/2022 and consistent with a meningioma. As before, there is mass effect upon the underlying brain parenchyma without appreciable underlying parenchymal edema. No midline shift or ventricular effacement. Partially empty sella turcica. There is no acute intracranial hemorrhage. No demarcated cortical infarct. No extra-axial fluid collection. No midline shift. Vascular: No hyperdense vessel.  Atherosclerotic calcifications. Skull: No calvarial fracture or aggressive osseous lesion. CT MAXILLOFACIAL FINDINGS Osseous: Mildly displaced fracture of the right nasal bone. No acute maxillofacial fracture identified elsewhere. Orbits: No acute orbital finding. Sinuses: Mild mucosal thickening within the bilateral maxillary sinuses. Soft tissues: Nasal, right facial and right forehead hematomas. CT CERVICAL SPINE FINDINGS Alignment: 4 mm C3-C4 grade 1 anterolisthesis. Skull base and vertebrae: The basion-dental and atlanto-dental intervals are maintained.No evidence of acute fracture to the cervical spine. Facet ankylosis on  the right at C2-C3. A degree of osseous fusion is also suspected across the disc space on the right at C2-C3. Soft tissues and spinal canal: No prevertebral fluid or swelling. No visible canal hematoma. Disc levels: Cervical spondylosis with multilevel disc space narrowing, disc bulges/central disc protrusions, posterior disc osteophyte complexes, uncovertebral hypertrophy and facet arthrosis. Disc space narrowing is greatest at C4-C5, C5-C6 and C6-C7 (advanced at these levels). Multilevel spinal canal stenosis most notably as follows. At C3-C4, a central disc protrusion contributes to at least moderate spinal canal stenosis. At C5-C6 and C6-C7, posterior disc osteophyte complexes contribute to suspected moderate spinal canal stenosis. Multilevel bony neural foraminal narrowing. Multilevel ventral osteophytes. Degenerative changes also present at the C1-C2 articulation. Upper chest: Partially imaged left pleural effusion. No visible pneumothorax. IMPRESSION: CT head: 1. No acute post-traumatic intracranial findings. 2. 5.7 x 4.3 x 2.0 cm meningioma overlying the left parietal lobe, unchanged from the prior head CT of 08/18/2022. 3. 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 4. Mild generalized cerebral atrophy. CT maxillofacial: 1. Mildly displaced fracture of the right nasal bone. 2. Nasal, right facial and right forehead hematomas. 3. Mild mucosal thickening within the bilateral maxillary sinuses. CT cervical spine: 1. No evidence of an acute cervical spine fracture. 2. 4 mm C3-C4 grade 1 anterolisthesis. 3. Cervical spondylosis as described. Multilevel spinal canal stenosis, greatest at C3-C4 (at least moderate), C5-C6 (suspected moderate) and C6-C7 (suspected moderate). Multilevel bony neural foraminal narrowing. Disc space narrowing is advanced at C4-C5, C5-C6 and C6-C7. 4. C2-C3 vertebral ankylosis. 5. Partially imaged left pleural effusion. Electronically Signed   By: Rockey Childs D.O.   On: 01/11/2023 13:35   CT Maxillofacial Wo Contrast Result Date: 01/11/2023 CLINICAL DATA:  Provided history: Head trauma, minor. Neck trauma. Facial trauma, blunt. Additional history provided: Fall (with facial trauma). Right cheek laceration. Right-sided facial swelling. EXAM: CT HEAD WITHOUT CONTRAST CT MAXILLOFACIAL WITHOUT CONTRAST CT CERVICAL SPINE WITHOUT CONTRAST TECHNIQUE: Multidetector CT imaging of the head, cervical spine, and maxillofacial structures were performed using the standard protocol without intravenous contrast. Multiplanar CT image reconstructions of the cervical spine and maxillofacial structures were also generated. RADIATION DOSE REDUCTION: This exam was performed according to the departmental dose-optimization program which includes automated exposure control, adjustment of  the mA and/or kV according to patient size and/or use of iterative reconstruction technique. COMPARISON:  Prior head CT examinations 08/18/2022 and earlier. Cervical spine radiographs 08/18/2015. FINDINGS: CT HEAD FINDINGS Brain: Mild generalized parenchymal volume loss. 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 5.7 x 4.3 x 2.0 cm extra-axial mass overlying the left parietal lobe, unchanged in size from the prior head CT of 08/18/2022 and consistent with a meningioma. As before, there is mass effect upon the underlying brain parenchyma without appreciable underlying parenchymal edema. No midline shift or ventricular effacement. Partially empty sella turcica. There is no acute intracranial hemorrhage. No demarcated cortical infarct. No extra-axial fluid collection. No midline shift. Vascular: No hyperdense vessel.  Atherosclerotic calcifications. Skull: No calvarial fracture or aggressive osseous lesion. CT MAXILLOFACIAL FINDINGS Osseous: Mildly displaced fracture of the right nasal bone. No acute maxillofacial fracture identified elsewhere. Orbits: No  acute orbital finding. Sinuses: Mild mucosal thickening within the bilateral maxillary sinuses. Soft tissues: Nasal, right facial and right forehead hematomas. CT CERVICAL SPINE FINDINGS Alignment: 4 mm C3-C4 grade 1 anterolisthesis. Skull base and vertebrae: The basion-dental and atlanto-dental intervals are maintained.No evidence of acute fracture to the cervical spine. Facet ankylosis on the right at C2-C3. A degree of osseous fusion is also suspected across the disc space on the right at C2-C3. Soft tissues and spinal canal: No prevertebral fluid or swelling. No visible canal hematoma. Disc levels: Cervical spondylosis with multilevel disc space narrowing, disc bulges/central disc protrusions, posterior disc osteophyte complexes, uncovertebral hypertrophy and facet arthrosis. Disc space narrowing is greatest at C4-C5, C5-C6 and C6-C7 (advanced at these levels). Multilevel spinal canal stenosis most notably as follows. At C3-C4, a central disc protrusion contributes to at least moderate spinal canal stenosis. At C5-C6 and C6-C7, posterior disc osteophyte complexes contribute to suspected moderate spinal canal stenosis. Multilevel bony neural foraminal narrowing. Multilevel ventral osteophytes. Degenerative changes also present at the C1-C2 articulation. Upper chest: Partially imaged left pleural effusion. No visible pneumothorax. IMPRESSION: CT head: 1. No acute post-traumatic intracranial findings. 2. 5.7 x 4.3 x 2.0 cm meningioma overlying the left parietal lobe, unchanged from the prior head CT of 08/18/2022. 3. 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 4. Mild generalized cerebral atrophy. CT maxillofacial: 1. Mildly displaced fracture of the right nasal bone. 2. Nasal, right facial and right forehead hematomas. 3. Mild mucosal thickening within the bilateral maxillary sinuses. CT cervical spine: 1. No evidence of an acute cervical spine fracture. 2. 4 mm C3-C4 grade 1  anterolisthesis. 3. Cervical spondylosis as described. Multilevel spinal canal stenosis, greatest at C3-C4 (at least moderate), C5-C6 (suspected moderate) and C6-C7 (suspected moderate). Multilevel bony neural foraminal narrowing. Disc space narrowing is advanced at C4-C5, C5-C6 and C6-C7. 4. C2-C3 vertebral ankylosis. 5. Partially imaged left pleural effusion. Electronically Signed   By: Rockey Childs D.O.   On: 01/11/2023 13:35   CT Cervical Spine Wo Contrast Result Date: 01/11/2023 CLINICAL DATA:  Provided history: Head trauma, minor. Neck trauma. Facial trauma, blunt. Additional history provided: Fall (with facial trauma). Right cheek laceration. Right-sided facial swelling. EXAM: CT HEAD WITHOUT CONTRAST CT MAXILLOFACIAL WITHOUT CONTRAST CT CERVICAL SPINE WITHOUT CONTRAST TECHNIQUE: Multidetector CT imaging of the head, cervical spine, and maxillofacial structures were performed using the standard protocol without intravenous contrast. Multiplanar CT image reconstructions of the cervical spine and maxillofacial structures were also generated. RADIATION DOSE REDUCTION: This exam was performed according to the departmental dose-optimization program which includes automated exposure control, adjustment  of the mA and/or kV according to patient size and/or use of iterative reconstruction technique. COMPARISON:  Prior head CT examinations 08/18/2022 and earlier. Cervical spine radiographs 08/18/2015. FINDINGS: CT HEAD FINDINGS Brain: Mild generalized parenchymal volume loss. 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 5.7 x 4.3 x 2.0 cm extra-axial mass overlying the left parietal lobe, unchanged in size from the prior head CT of 08/18/2022 and consistent with a meningioma. As before, there is mass effect upon the underlying brain parenchyma without appreciable underlying parenchymal edema. No midline shift or ventricular effacement. Partially empty sella turcica. There  is no acute intracranial hemorrhage. No demarcated cortical infarct. No extra-axial fluid collection. No midline shift. Vascular: No hyperdense vessel.  Atherosclerotic calcifications. Skull: No calvarial fracture or aggressive osseous lesion. CT MAXILLOFACIAL FINDINGS Osseous: Mildly displaced fracture of the right nasal bone. No acute maxillofacial fracture identified elsewhere. Orbits: No acute orbital finding. Sinuses: Mild mucosal thickening within the bilateral maxillary sinuses. Soft tissues: Nasal, right facial and right forehead hematomas. CT CERVICAL SPINE FINDINGS Alignment: 4 mm C3-C4 grade 1 anterolisthesis. Skull base and vertebrae: The basion-dental and atlanto-dental intervals are maintained.No evidence of acute fracture to the cervical spine. Facet ankylosis on the right at C2-C3. A degree of osseous fusion is also suspected across the disc space on the right at C2-C3. Soft tissues and spinal canal: No prevertebral fluid or swelling. No visible canal hematoma. Disc levels: Cervical spondylosis with multilevel disc space narrowing, disc bulges/central disc protrusions, posterior disc osteophyte complexes, uncovertebral hypertrophy and facet arthrosis. Disc space narrowing is greatest at C4-C5, C5-C6 and C6-C7 (advanced at these levels). Multilevel spinal canal stenosis most notably as follows. At C3-C4, a central disc protrusion contributes to at least moderate spinal canal stenosis. At C5-C6 and C6-C7, posterior disc osteophyte complexes contribute to suspected moderate spinal canal stenosis. Multilevel bony neural foraminal narrowing. Multilevel ventral osteophytes. Degenerative changes also present at the C1-C2 articulation. Upper chest: Partially imaged left pleural effusion. No visible pneumothorax. IMPRESSION: CT head: 1. No acute post-traumatic intracranial findings. 2. 5.7 x 4.3 x 2.0 cm meningioma overlying the left parietal lobe, unchanged from the prior head CT of 08/18/2022. 3. Known  chronic cortical/subcortical right parietal lobe infarct (MCA territory). 4. Mild generalized cerebral atrophy. CT maxillofacial: 1. Mildly displaced fracture of the right nasal bone. 2. Nasal, right facial and right forehead hematomas. 3. Mild mucosal thickening within the bilateral maxillary sinuses. CT cervical spine: 1. No evidence of an acute cervical spine fracture. 2. 4 mm C3-C4 grade 1 anterolisthesis. 3. Cervical spondylosis as described. Multilevel spinal canal stenosis, greatest at C3-C4 (at least moderate), C5-C6 (suspected moderate) and C6-C7 (suspected moderate). Multilevel bony neural foraminal narrowing. Disc space narrowing is advanced at C4-C5, C5-C6 and C6-C7. 4. C2-C3 vertebral ankylosis. 5. Partially imaged left pleural effusion. Electronically Signed   By: Rockey Childs D.O.   On: 01/11/2023 13:35   DG Chest Port 1 View Result Date: 01/11/2023 CLINICAL DATA:  Fall.  Chest pain. EXAM: PORTABLE CHEST 1 VIEW COMPARISON:  Chest radiograph dated October 26, 2022. FINDINGS: Stable cardiomediastinal silhouette. Stable left-sided pacemaker in place. Prior median sternotomy and CABG. Small left pleural effusion. No focal consolidation or pneumothorax. No acute osseous abnormality identified. IMPRESSION: Small left pleural effusion with mild left basilar atelectasis. Electronically Signed   By: Harrietta Sherry M.D.   On: 01/11/2023 13:17   DG Hand Complete Left Result Date: 01/11/2023 CLINICAL DATA:  Fall.  Left hand pain. EXAM: LEFT HAND -  COMPLETE 3+ VIEW COMPARISON:  None Available. FINDINGS: There is no evidence of acute fracture or dislocation. Cystic lucency noted within the scaphoid and lunate. Mild radiocarpal joint space narrowing. Mild diffuse interphalangeal joint space narrowing. Diffuse soft tissue swelling of the wrist and hand. No radiopaque foreign body. Vascular calcifications are noted. IMPRESSION: 1. No acute osseous abnormality. 2. Diffuse nonspecific soft tissue swelling of  the wrist and hand. No radiopaque foreign body. Electronically Signed   By: Harrietta Sherry M.D.   On: 01/11/2023 13:13   Microbiology: Results for orders placed or performed during the hospital encounter of 01/21/23  Blood culture (routine x 2)     Status: None   Collection Time: 01/21/23 10:47 AM   Specimen: BLOOD  Result Value Ref Range Status   Specimen Description   Final    BLOOD LEFT ANTECUBITAL Performed at [facility], [address], [place], [place] [address]    Special Requests   Final    BOTTLES DRAWN AEROBIC AND ANAEROBIC Blood Culture results may not be optimal due to an inadequate volume of blood received in culture bottles Performed at [facility], [address], [place], [place] [address]    Culture   Final    NO GROWTH 5 DAYS Performed at [facility], [address] [place], [place] [address]    Report Status 01/26/2023 FINAL  Final  Blood culture (routine x 2)     Status: None   Collection Time: 01/21/23 10:48 AM   Specimen: BLOOD  Result Value Ref Range Status   Specimen Description   Final    BLOOD LEFT ANTECUBITAL Performed at [facility], [address], [place], [place] [address]    Special Requests   Final    BOTTLES DRAWN AEROBIC AND ANAEROBIC Blood Culture results may not be optimal due to an inadequate volume of blood received in culture bottles Performed at [facility], [address], [place], [place] [address]    Culture   Final    NO GROWTH 5 DAYS Performed at [facility], [address] [place], [place] [address]    Report Status 01/26/2023 FINAL  Final   Labs: CBC: Recent Labs  Lab 01/21/23 1037 01/22/23 0434 01/23/23 0433 01/24/23 0425 01/25/23 0434  WBC 10.4 9.2 8.4 11.1* 9.3  NEUTROABS 7.8*  --   --   --   --   HGB 11.2* 10.6* 10.7* 11.1* 11.2*  HCT 37.1* 36.4* 36.6* 37.7* 36.5*  MCV 84.3 85.4 85.5 84.7 82.6  PLT 219 212 214 226  252   Basic Metabolic Panel: Recent Labs  Lab 01/21/23 1037 01/22/23 0434 01/23/23 0433 01/24/23 0425 01/25/23 0434  NA 136 132* 130* 134* 135  K 4.5 4.3 4.1 4.7 4.4  CL 99 97* 96* 98 96*  CO2 27 26 24 26 25   GLUCOSE 68* 77 81 138* 98  BUN 64* 64* 61* 63* 57*  CREATININE 3.37* 3.31* 2.62* 3.35* 3.51*  CALCIUM  8.5* 7.8* 7.9* 8.2* 8.3*  MG  --   --  2.5* 2.3 2.3  PHOS  --   --  3.5  --   --    Liver Function Tests: Recent Labs  Lab 01/21/23 1037 01/22/23 0434  AST 27 23  ALT 14 12  ALKPHOS 132* 109  BILITOT 1.6* 1.6*  PROT 7.5 6.4*  ALBUMIN  2.6* 2.2*   CBG: Recent Labs  Lab 01/24/23 0745 01/24/23 1223 01/24/23 1656  01/24/23 2011 01/25/23 0854  GLUCAP 114* 104* 99 130* 126*   Discharge time spent: greater than 30 minutes.  Signed: Alejandro Marker, DO [facility] 01/27/2023

## 2023-01-26 LAB — CULTURE, BLOOD (ROUTINE X 2)
Culture: NO GROWTH
Culture: NO GROWTH

## 2023-01-30 ENCOUNTER — Telehealth: Payer: Self-pay | Admitting: *Deleted

## 2023-01-30 NOTE — Telephone Encounter (Signed)
 Faxed [facility] referral form today with records, But it seems as though the patient was going to get the [place] to do the referral  But I submitted records anyway   [facility] Referral Form  Phone [phone]  Fax- [phone]

## 2023-01-31 ENCOUNTER — Other Ambulatory Visit (HOSPITAL_COMMUNITY): Payer: Self-pay | Admitting: Physician Assistant

## 2023-01-31 DIAGNOSIS — K7460 Unspecified cirrhosis of liver: Secondary | ICD-10-CM

## 2023-02-04 ENCOUNTER — Ambulatory Visit (HOSPITAL_COMMUNITY)
Admission: RE | Admit: 2023-02-04 | Discharge: 2023-02-04 | Disposition: A | Discharge status: Home / Self Care | Payer: Federal, State, Local not specified - PPO | Source: Ambulatory Visit | Attending: Physician Assistant | Admitting: Physician Assistant

## 2023-02-04 DIAGNOSIS — K7460 Unspecified cirrhosis of liver: Secondary | ICD-10-CM | POA: Insufficient documentation

## 2023-02-04 DIAGNOSIS — K7290 Hepatic failure, unspecified without coma: Secondary | ICD-10-CM | POA: Diagnosis not present

## 2023-02-04 DIAGNOSIS — R188 Other ascites: Secondary | ICD-10-CM | POA: Insufficient documentation

## 2023-02-04 HISTORY — PX: IR PARACENTESIS: IMG2679

## 2023-02-04 MED ORDER — LIDOCAINE HCL 1 % IJ SOLN
INTRAMUSCULAR | Status: AC
Start: 2023-02-04 — End: ?
  Filled 2023-02-04: qty 20

## 2023-02-04 NOTE — Procedures (Signed)
PROCEDURE SUMMARY:  Successful image-guided paracentesis from the right lower abdomen.  Yielded 6 liters of hazy yellow fluid.  No immediate complications.  EBL = trace. Patient tolerated well.   Specimen was not sent for labs.  Please see imaging section of Epic for full dictation.  Patient is being referred to [facility] for possible liver transplant.    Lynann Bologna Haliyah Fryman PA-C 02/04/2023 2:04 PM

## 2023-02-15 ENCOUNTER — Other Ambulatory Visit: Payer: Self-pay | Admitting: Neurosurgery

## 2023-02-15 DIAGNOSIS — D496 Neoplasm of unspecified behavior of brain: Secondary | ICD-10-CM

## 2023-02-15 LAB — PATHOLOGIST SMEAR REVIEW

## 2023-02-28 ENCOUNTER — Encounter (HOSPITAL_COMMUNITY): Payer: Self-pay | Admitting: Internal Medicine

## 2023-02-28 NOTE — Progress Notes (Signed)
Pre op call eval Name:  Edward [name]  [facility]  [facility] Thomes Lolling  EKG-08/18/22 Echo-10/27/22 Stress Test-2018 Cath-02/16/21 Blood thinner-n/a GLP-1-Ozempic last dose 2/9 7 day hold   Hx: DM, CHF, CAD, CBAG 2013,HTN,Stroke,CKD3.GAVE Adenoma of colon, Pacemaker.  Patient has pretty much all of his care through the [place]. He does say he sees cardiology through them, last saw in Christmas time, due to see again in March/April. Endorses no new cardiac or breathing issues. Does use walker for longer distances.  Anesthesia Review: Yes

## 2023-03-01 ENCOUNTER — Other Ambulatory Visit (HOSPITAL_COMMUNITY): Payer: Self-pay | Admitting: Physician Assistant

## 2023-03-01 DIAGNOSIS — K7460 Unspecified cirrhosis of liver: Secondary | ICD-10-CM

## 2023-03-01 DIAGNOSIS — K7290 Hepatic failure, unspecified without coma: Secondary | ICD-10-CM

## 2023-03-06 ENCOUNTER — Telehealth: Payer: Self-pay

## 2023-03-06 ENCOUNTER — Ambulatory Visit (HOSPITAL_COMMUNITY)
Admission: RE | Admit: 2023-03-06 | Discharge: 2023-03-06 | Disposition: A | Discharge status: Home / Self Care | Payer: No Typology Code available for payment source | Source: Ambulatory Visit | Attending: Physician Assistant | Admitting: Physician Assistant

## 2023-03-06 DIAGNOSIS — K7290 Hepatic failure, unspecified without coma: Secondary | ICD-10-CM | POA: Diagnosis present

## 2023-03-06 DIAGNOSIS — R188 Other ascites: Secondary | ICD-10-CM | POA: Diagnosis not present

## 2023-03-06 DIAGNOSIS — K7460 Unspecified cirrhosis of liver: Secondary | ICD-10-CM | POA: Diagnosis not present

## 2023-03-06 HISTORY — PX: IR PARACENTESIS: IMG2679

## 2023-03-06 MED ORDER — LIDOCAINE HCL 1 % IJ SOLN
INTRAMUSCULAR | Status: AC
  Filled 2023-03-06: qty 20

## 2023-03-06 NOTE — Telephone Encounter (Signed)
 Left message for patient to return my call.

## 2023-03-06 NOTE — Telephone Encounter (Signed)
Explained to patient due to inclement weather we are calling patients to verify they are coming for there procedures at the hospital tomorrow. If so, their times will have to be pushed back. Patient states he is still planning to come for his procedure tomorrow. Informed patient to arrive at 9:15 am for a 10:45 am procedure time. Patient verbalized understanding.

## 2023-03-07 ENCOUNTER — Ambulatory Visit (HOSPITAL_COMMUNITY)
Admission: RE | Admit: 2023-03-07 | Discharge: 2023-03-07 | Disposition: A | Discharge status: Home / Self Care | Payer: No Typology Code available for payment source | Attending: Internal Medicine | Admitting: Internal Medicine

## 2023-03-07 ENCOUNTER — Ambulatory Visit (HOSPITAL_COMMUNITY): Payer: No Typology Code available for payment source | Admitting: Anesthesiology

## 2023-03-07 ENCOUNTER — Encounter (HOSPITAL_COMMUNITY): Payer: Self-pay | Admitting: Internal Medicine

## 2023-03-07 ENCOUNTER — Other Ambulatory Visit: Payer: Self-pay

## 2023-03-07 ENCOUNTER — Ambulatory Visit (HOSPITAL_BASED_OUTPATIENT_CLINIC_OR_DEPARTMENT_OTHER): Payer: No Typology Code available for payment source | Admitting: Anesthesiology

## 2023-03-07 ENCOUNTER — Encounter (HOSPITAL_COMMUNITY)
Admission: RE | Disposition: A | Discharge status: Home / Self Care | Payer: Self-pay | Source: Home / Self Care | Attending: Internal Medicine

## 2023-03-07 DIAGNOSIS — Z8673 Personal history of transient ischemic attack (TIA), and cerebral infarction without residual deficits: Secondary | ICD-10-CM | POA: Insufficient documentation

## 2023-03-07 DIAGNOSIS — R188 Other ascites: Secondary | ICD-10-CM

## 2023-03-07 DIAGNOSIS — I2510 Atherosclerotic heart disease of native coronary artery without angina pectoris: Secondary | ICD-10-CM | POA: Insufficient documentation

## 2023-03-07 DIAGNOSIS — Z79890 Hormone replacement therapy (postmenopausal): Secondary | ICD-10-CM | POA: Diagnosis not present

## 2023-03-07 DIAGNOSIS — Z87891 Personal history of nicotine dependence: Secondary | ICD-10-CM | POA: Diagnosis not present

## 2023-03-07 DIAGNOSIS — K219 Gastro-esophageal reflux disease without esophagitis: Secondary | ICD-10-CM | POA: Insufficient documentation

## 2023-03-07 DIAGNOSIS — I8500 Esophageal varices without bleeding: Secondary | ICD-10-CM

## 2023-03-07 DIAGNOSIS — Z7985 Long-term (current) use of injectable non-insulin antidiabetic drugs: Secondary | ICD-10-CM | POA: Insufficient documentation

## 2023-03-07 DIAGNOSIS — I130 Hypertensive heart and chronic kidney disease with heart failure and stage 1 through stage 4 chronic kidney disease, or unspecified chronic kidney disease: Secondary | ICD-10-CM | POA: Diagnosis not present

## 2023-03-07 DIAGNOSIS — I5032 Chronic diastolic (congestive) heart failure: Secondary | ICD-10-CM | POA: Diagnosis not present

## 2023-03-07 DIAGNOSIS — Z951 Presence of aortocoronary bypass graft: Secondary | ICD-10-CM | POA: Insufficient documentation

## 2023-03-07 DIAGNOSIS — E1122 Type 2 diabetes mellitus with diabetic chronic kidney disease: Secondary | ICD-10-CM | POA: Insufficient documentation

## 2023-03-07 DIAGNOSIS — D509 Iron deficiency anemia, unspecified: Secondary | ICD-10-CM | POA: Diagnosis present

## 2023-03-07 DIAGNOSIS — E039 Hypothyroidism, unspecified: Secondary | ICD-10-CM | POA: Diagnosis not present

## 2023-03-07 DIAGNOSIS — I480 Paroxysmal atrial fibrillation: Secondary | ICD-10-CM | POA: Insufficient documentation

## 2023-03-07 DIAGNOSIS — E785 Hyperlipidemia, unspecified: Secondary | ICD-10-CM | POA: Insufficient documentation

## 2023-03-07 DIAGNOSIS — E1151 Type 2 diabetes mellitus with diabetic peripheral angiopathy without gangrene: Secondary | ICD-10-CM | POA: Diagnosis not present

## 2023-03-07 DIAGNOSIS — K31819 Angiodysplasia of stomach and duodenum without bleeding: Secondary | ICD-10-CM | POA: Insufficient documentation

## 2023-03-07 DIAGNOSIS — K7460 Unspecified cirrhosis of liver: Secondary | ICD-10-CM | POA: Insufficient documentation

## 2023-03-07 DIAGNOSIS — K766 Portal hypertension: Secondary | ICD-10-CM | POA: Diagnosis not present

## 2023-03-07 DIAGNOSIS — I8510 Secondary esophageal varices without bleeding: Secondary | ICD-10-CM | POA: Diagnosis not present

## 2023-03-07 DIAGNOSIS — R0602 Shortness of breath: Secondary | ICD-10-CM

## 2023-03-07 DIAGNOSIS — Z794 Long term (current) use of insulin: Secondary | ICD-10-CM | POA: Insufficient documentation

## 2023-03-07 DIAGNOSIS — Z8719 Personal history of other diseases of the digestive system: Secondary | ICD-10-CM

## 2023-03-07 DIAGNOSIS — K3189 Other diseases of stomach and duodenum: Secondary | ICD-10-CM | POA: Insufficient documentation

## 2023-03-07 DIAGNOSIS — N183 Chronic kidney disease, stage 3 unspecified: Secondary | ICD-10-CM | POA: Insufficient documentation

## 2023-03-07 HISTORY — PX: ESOPHAGOGASTRODUODENOSCOPY (EGD) WITH PROPOFOL: SHX5813

## 2023-03-07 LAB — GLUCOSE, CAPILLARY: Glucose-Capillary: 116 mg/dL — ABNORMAL HIGH (ref 70–99)

## 2023-03-07 SURGERY — ESOPHAGOGASTRODUODENOSCOPY (EGD) WITH PROPOFOL
Anesthesia: Monitor Anesthesia Care

## 2023-03-07 MED ORDER — LACTULOSE 10 GM/15ML PO SOLN: 10.0000 g | Freq: Three times a day (TID) | ORAL | 3 refills | Status: DC

## 2023-03-07 MED ORDER — SODIUM CHLORIDE 0.9 % IV SOLN
INTRAVENOUS | Status: DC
Start: 2023-03-07 — End: 2023-03-07

## 2023-03-07 MED ORDER — PROPOFOL 500 MG/50ML IV EMUL
INTRAVENOUS | Status: DC | PRN
  Administered 2023-03-07: 125 ug/kg/min via INTRAVENOUS

## 2023-03-07 MED ORDER — SODIUM CHLORIDE 0.9% FLUSH: 3.0000 mL | Freq: Two times a day (BID) | INTRAVENOUS | Status: DC

## 2023-03-07 MED ORDER — PHENYLEPHRINE 80 MCG/ML (10ML) SYRINGE FOR IV PUSH (FOR BLOOD PRESSURE SUPPORT)
PREFILLED_SYRINGE | INTRAVENOUS | Status: DC | PRN
  Administered 2023-03-07: 160 ug via INTRAVENOUS
  Administered 2023-03-07: 80 ug via INTRAVENOUS

## 2023-03-07 MED ORDER — SODIUM CHLORIDE 0.9% FLUSH: 3.0000 mL | INTRAVENOUS | Status: DC | PRN

## 2023-03-07 MED ORDER — PROPOFOL 500 MG/50ML IV EMUL
INTRAVENOUS | Status: AC
Start: 2023-03-07 — End: ?
  Filled 2023-03-07: qty 50

## 2023-03-07 MED ORDER — PROPOFOL 10 MG/ML IV BOLUS
INTRAVENOUS | Status: DC | PRN
  Administered 2023-03-07: 20 mg via INTRAVENOUS

## 2023-03-07 SURGICAL SUPPLY — 14 items

## 2023-03-07 NOTE — Anesthesia Procedure Notes (Signed)
Procedure Name: MAC Date/Time: 03/07/2023 10:48 AM  Performed by: Elyn Peers, CRNAPre-anesthesia Checklist: Patient identified, Emergency Drugs available, Suction available, Patient being monitored and Timeout performed Oxygen Delivery Method: Simple face mask Preoxygenation: POM used. Placement Confirmation: positive ETCO2

## 2023-03-07 NOTE — Anesthesia Preprocedure Evaluation (Addendum)
Anesthesia Evaluation  Patient identified by MRN, date of birth, ID band Patient awake    Reviewed: Allergy & Precautions, NPO status , Patient's Chart, lab work & pertinent test results  History of Anesthesia Complications Negative for: history of anesthetic complications  Airway Mallampati: III  TM Distance: >3 FB Neck ROM: Full    Dental  (+) Dental Advisory Given   Pulmonary neg shortness of breath, neg sleep apnea, neg COPD, neg recent URI, former smoker   Pulmonary exam normal breath sounds clear to auscultation       Cardiovascular hypertension (carvedilol), Pt. on home beta blockers (-) angina + CAD, + CABG, + Peripheral Vascular Disease (s/p fem-pop bypass graft) and +CHF  + dysrhythmias (accerlerated junctional rhythm, LBBB; s/p MAZE procedure) Atrial Fibrillation + pacemaker ([employer], SSS)  Rhythm:Regular Rate:Normal  HLD, carotid artery occlusion s/p CEA  TTE 10/27/2022: IMPRESSIONS    1. Left ventricular ejection fraction, by estimation, is 50 to 55%. The  left ventricle has low normal function. The left ventricle has no regional  wall motion abnormalities. There is moderate concentric left ventricular  hypertrophy. Left ventricular  diastolic parameters are indeterminate.   2. Right ventricular systolic function is moderately reduced. The right  ventricular size is normal. There is mildly elevated pulmonary artery  systolic pressure. The estimated right ventricular systolic pressure is  39.4 mmHg.   3. Left atrial size was mildly dilated.   4. Right atrial size was mildly dilated.   5. The mitral valve is grossly normal. Trivial mitral valve  regurgitation.   6. Aortic valve regurgitation is trivial. Aortic valve  sclerosis/calcification is present, without any evidence of aortic  stenosis.   7. The inferior vena cava is dilated in size with <50% respiratory  variability, suggesting right atrial pressure of  15 mmHg.     Neuro/Psych neg Seizures Brain tumor  Neuromuscular disease (neuropathy, lumbar spinal stenosis) CVA    GI/Hepatic ,GERD  ,,(+) Cirrhosis  (s/p IR paracentesis 03/06/2023 with removal of 6.7L of fluid)    substance abuse  alcohol useGAVE, diverticulosis   Endo/Other  diabetes, Type 2, Insulin DependentHypothyroidism    Renal/GU CRFRenal disease     Musculoskeletal  (+) Arthritis , Osteoarthritis,    Abdominal  (+) + obese  Peds  Hematology  (+) Blood dyscrasia, anemia Lab Results      Component                Value               Date                      WBC                      9.3                 01/25/2023                HGB                      11.2 (L)            01/25/2023                HCT                      36.5 (L)            01/25/2023  MCV                      82.6                01/25/2023                PLT                      252                 01/25/2023              Anesthesia Other Findings Last semaglutide: 02/24/2023  Reproductive/Obstetrics                              Anesthesia Physical Anesthesia Plan  ASA: 4  Anesthesia Plan: MAC   Post-op Pain Management:    Induction: Intravenous  PONV Risk Score and Plan: 1 and Propofol infusion, TIVA and Treatment may vary due to age or medical condition  Airway Management Planned: Natural Airway and Simple Face Mask  Additional Equipment:   Intra-op Plan:   Post-operative Plan:   Informed Consent: I have reviewed the patients History and Physical, chart, labs and discussed the procedure including the risks, benefits and alternatives for the proposed anesthesia with the patient or authorized representative who has indicated his/her understanding and acceptance.     Dental advisory given  Plan Discussed with: CRNA and Anesthesiologist  Anesthesia Plan Comments: (Discussed with patient risks of MAC including, but not limited to, minor  pain or discomfort, hearing people in the room, and possible need for backup general anesthesia. Risks for general anesthesia also discussed including, but not limited to, sore throat, hoarse voice, chipped/damaged teeth, injury to vocal cords, nausea and vomiting, allergic reactions, lung infection, heart attack, stroke, and death. All questions answered. )         Anesthesia Quick Evaluation

## 2023-03-07 NOTE — Anesthesia Postprocedure Evaluation (Signed)
Anesthesia Post Note  Patient: Edward [name]  Procedure(s) Performed: ESOPHAGOGASTRODUODENOSCOPY (EGD) WITH PROPOFOL     Patient location during evaluation: PACU Anesthesia Type: MAC Level of consciousness: awake Pain management: pain level controlled Vital Signs Assessment: post-procedure vital signs reviewed and stable Respiratory status: spontaneous breathing, nonlabored ventilation and respiratory function stable Cardiovascular status: stable and blood pressure returned to baseline Postop Assessment: no apparent nausea or vomiting Anesthetic complications: no   No notable events documented.  Last Vitals:  Vitals:   03/07/23 1110 03/07/23 1117  BP: (!) 91/49 (!) 121/51  Pulse: 63 65  Resp: 11 14  Temp:    SpO2: 100% 100%    Last Pain:  Vitals:   03/07/23 1117  TempSrc:   PainSc: 0-No pain                 Linton Rump

## 2023-03-07 NOTE — Op Note (Signed)
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 Rhea Belton, MD, Lorenza Evangelist, RN, Rozetta Nunnery, Technician Referring MD:             [facility] Medicines:                Monitored Anesthesia Care Complications:            No immediate complications. Estimated Blood Loss:     Estimated blood loss: none. Procedure:                Pre-Anesthesia Assessment:                           - Prior to the procedure, a History and Physical                            was performed, and patient medications and                            allergies were reviewed. The patient's tolerance of                            previous anesthesia was also reviewed. The risks                            and benefits of the procedure and the sedation                            options and risks were discussed with the patient.                            All questions were answered, and informed consent                            was obtained. Prior Anticoagulants: The patient has  taken no anticoagulant or antiplatelet agents. ASA                            Grade Assessment: III - A patient with severe                            systemic disease. After reviewing the risks and                            benefits, the patient was deemed in satisfactory                            condition to undergo the procedure.                           After  obtaining informed consent, the endoscope was                            passed under direct vision. Throughout the                            procedure, the patient's blood pressure, pulse, and                            oxygen saturations were monitored continuously. The                            [id number]-[id number] ([id number]) Olympus endoscope was introduced                            through the mouth, and advanced to the second part                            of duodenum. The upper GI endoscopy was                            accomplished without difficulty. The patient                            tolerated the procedure well. Scope In: Scope Out: Findings:      Grade I varices were found at the gastroesophageal junction. They were       diminutive in size.      The exam of the esophagus was otherwise normal.      Mild portal hypertensive gastropathy was found in the cardia and in the       gastric fundus.      The gastric antrum was normal. Excellent treatment response from       previous APC ablation for GAVE. No residual areas in need of treatment.      The examined duodenum was normal. Impression:               - Grade I (very small) distal esophageal varices.                           -  Mild portal hypertensive gastropathy.                           - Normal antrum. GAVE has been successfully treated.                           - Normal examined duodenum.                           - No specimens collected. Moderate Sedation:      N/A Recommendation:           - Patient has a contact number available for                            emergencies. The signs and symptoms of potential                            delayed complications were discussed with the                            patient. Return to normal activities tomorrow.                            Written discharge instructions were provided to the                            patient.                           - Low sodium diet.                            - Continue present medications. Diuretic management                            for recurrent ascites is deferred to nephrology                            given chronic kidney disease. Currently furosemide,                            will defer the decision on adjustment or adding                            back spironolactone to patient's kidney and primary                            care physicians.                           - Continue carvedilol at current dose. Repeat EGD                            is not need for routine variceal surveillance while  maintained on beta blocker.                           - Continue as needed intermittent large volume                            paracentesis with IV albumin as needed.                           - Close follow-up recommended with [facility] primary                            care, gastroenterology (Dr. Grayling Congress) and                            nephrology. We will arrange [facility] follow-up                            for comanagement as requested.                           - Resume lactulose 10g/15 mL one teaspoon three                            times daily for hepatic encephalopathy. If                            rifaximin 550 mg BID is available through the [facility]                            it should be started. Procedure Code(s):        --- Professional ---                           [phone], Esophagogastroduodenoscopy, flexible,                            transoral; diagnostic, including collection of                            specimen(s) by brushing or washing, when performed                            (separate procedure) Diagnosis Code(s):        --- Professional ---                           I85.00, Esophageal varices without bleeding                           K76.6, Portal hypertension                           K31.89, Other diseases of stomach and duodenum  D50.9, Iron  deficiency anemia, unspecified                           K31.819, Angiodysplasia of stomach and duodenum                            without bleeding CPT copyright 2022 [employer]. All rights reserved. The codes documented in this report are preliminary and upon coder review may  be revised to meet current compliance requirements. Beverley Fiedler, MD 03/07/2023 11:10:16 AM This report has been signed electronically. Number of Addenda: 0

## 2023-03-07 NOTE — H&P (Signed)
GASTROENTEROLOGY PROCEDURE H&P NOTE   Primary Care Physician: [facility]    Reason for Procedure:  Cirrhosis, iron deficiency anemia and GAVE  Plan:    EGD  Patient is appropriate for endoscopic procedure(s) in the outpatient hospital setting  The nature of the procedure, as well as the risks, benefits, and alternatives were carefully and thoroughly reviewed with the patient. Ample time for discussion and questions allowed. The patient understood, was satisfied, and agreed to proceed.     HPI: Edward [name] is a 83 y.o. male who presents for EGD.  Medical history as below.   No recent chest pain or shortness of breath.  No abdominal pain today.  Recent LVP.  Reports being out of lactulose.  Past Medical History:  Diagnosis Date   Acute on chronic diastolic heart failure (HCC) 03/20/2022   Anemia    ?   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   GERD (gastroesophageal reflux disease)    GI bleed    while on triple anticoag therapy   HTN (hypertension)    Pacemaker    PAF (paroxysmal atrial fibrillation) (HCC)    s/p MAZE at time of CABG   PVD (peripheral vascular disease) (HCC)    R CEA 2011, prior PTA, left fem-pop 2014   Stroke (HCC)    vs TIA   Tubular adenoma of colon    Type II diabetes mellitus (HCC)     Past Surgical History:  Procedure Laterality Date   ABDOMINAL AORTAGRAM N/A 01/07/2012   Procedure: ABDOMINAL Ronny Flurry;  Surgeon: Chuck Hint, MD;  Location: [facility] CATH LAB;   Service: Cardiovascular;  Laterality: N/A;   BALLOON DILATION N/A 02/17/2013   Procedure: BALLOON DILATION;  Surgeon: Beverley Fiedler, MD;  Location: [facility] ENDOSCOPY;  Service: Gastroenterology;  Laterality: N/A;   BIOPSY  03/17/2021   Procedure: BIOPSY;  Surgeon: Iva Boop, MD;  Location: [facility] ENDOSCOPY;  Service: Gastroenterology;;   BIOPSY  08/26/2022   Procedure: BIOPSY;  Surgeon: Lemar Lofty., MD;  Location: [facility] ENDOSCOPY;  Service: Gastroenterology;;   CAROTID ENDARTERECTOMY Right 03/2009   CATARACT EXTRACTION Bilateral    CHOLECYSTECTOMY     CIRCUMCISION     COLONOSCOPY WITH PROPOFOL N/A 02/17/2013   Procedure: COLONOSCOPY WITH PROPOFOL;  Surgeon: Beverley Fiedler, MD;  Location: [facility] ENDOSCOPY;  Service: Gastroenterology;  Laterality: N/A;   COLONOSCOPY WITH PROPOFOL N/A 03/18/2021   Procedure: COLONOSCOPY WITH PROPOFOL;  Surgeon: Jeani Hawking, MD;  Location: [facility] ENDOSCOPY;  Service: Gastroenterology;  Laterality: N/A;   CORONARY ARTERY BYPASS GRAFT  06/14/2011   Procedure: CORONARY ARTERY BYPASS GRAFTING (CABG);  Surgeon: Alleen Borne, MD;  Location: [facility] OR;  Service: Open Heart Surgery;  Laterality: N/A;  Coronary Artery Bypass Graft on pump times six;  utilizing internal mammary artery and right greater saphenous vein harvested endoscopically.x 5 vessels   ELECTROPHYSIOLOGY STUDY N/A 11/05/2012   Procedure: ELECTROPHYSIOLOGY STUDY;  Surgeon: Duke Salvia, MD;  Location: [facility]  CATH LAB;  Service: Cardiovascular;  Laterality: N/A;   ESOPHAGOGASTRODUODENOSCOPY N/A 08/26/2022   Procedure: ESOPHAGOGASTRODUODENOSCOPY (EGD);  Surgeon: Lemar Lofty., MD;  Location: [facility] ENDOSCOPY;  Service: Gastroenterology;  Laterality: N/A;   ESOPHAGOGASTRODUODENOSCOPY (EGD) WITH PROPOFOL N/A 02/17/2013   Procedure: ESOPHAGOGASTRODUODENOSCOPY (EGD) WITH PROPOFOL;  Surgeon: Beverley Fiedler, MD;  Location: [facility] ENDOSCOPY;  Service: Gastroenterology;  Laterality: N/A;   ESOPHAGOGASTRODUODENOSCOPY (EGD) WITH PROPOFOL  N/A 03/17/2021   Procedure: ESOPHAGOGASTRODUODENOSCOPY (EGD) WITH PROPOFOL;  Surgeon: Iva Boop, MD;  Location: [facility] ENDOSCOPY;  Service: Gastroenterology;  Laterality: N/A;   ESOPHAGOGASTRODUODENOSCOPY (EGD) WITH PROPOFOL N/A 09/22/2022   Procedure: ESOPHAGOGASTRODUODENOSCOPY (EGD) WITH PROPOFOL;  Surgeon: Meridee Score Netty Starring., MD;  Location: [facility] ENDOSCOPY;  Service: Gastroenterology;  Laterality: N/A;   FEMORAL-POPLITEAL BYPASS GRAFT Left 03/06/2012   Procedure: BYPASS GRAFT FEMORAL-POPLITEAL ARTERY;  Surgeon: Chuck Hint, MD;  Location: [facility] OR;  Service: Vascular;  Laterality: Left;  Left Femoral - Below Knee Popliteal Bypass Graft with Vein and Intraoperative Arteriogram.   GI RADIOFREQUENCY ABLATION  08/26/2022   Procedure: GI RADIOFREQUENCY ABLATION;  Surgeon: Lemar Lofty., MD;  Location: [facility] ENDOSCOPY;  Service: Gastroenterology;;   GIVENS CAPSULE STUDY N/A 08/26/2022   Procedure: GIVENS CAPSULE STUDY;  Surgeon: Lemar Lofty., MD;  Location: [facility] ENDOSCOPY;  Service: Gastroenterology;  Laterality: N/A;   HOT HEMOSTASIS N/A 09/22/2022   Procedure: HOT HEMOSTASIS (ARGON PLASMA COAGULATION/BICAP);  Surgeon: Lemar Lofty., MD;  Location: [facility] ENDOSCOPY;  Service: Gastroenterology;  Laterality: N/A;   IR PARACENTESIS  05/23/2022   IR PARACENTESIS  12/04/2022   IR PARACENTESIS  12/25/2022   IR PARACENTESIS  02/04/2023   IR PARACENTESIS  03/06/2023   JOINT REPLACEMENT Left    left TKA   KNEE ARTHROSCOPY Left    "I had 3" (11/05/2012)   LEAD REVISION  11/05/2012   pacemaker/notes 11/05/2012   LEAD REVISION N/A 11/05/2012   Procedure: LEAD REVISION;  Surgeon: Duke Salvia, MD;  Location: [facility] CATH LAB;  Service: Cardiovascular;  Laterality: N/A;   MAZE  06/14/2011   Procedure: MAZE;  Surgeon: Alleen Borne, MD;  Location: [facility] OR;  Service: Open Heart Surgery;  Laterality: N/A;  Ligate left atrial appendage   PERMANENT PACEMAKER INSERTION N/A 06/20/2011   Procedure:  PERMANENT PACEMAKER INSERTION;  Surgeon: Duke Salvia, MD;  Location: [facility] CATH LAB;  Service: Cardiovascular;  Laterality: N/A;   POLYPECTOMY  03/18/2021   Procedure: POLYPECTOMY;  Surgeon: Jeani Hawking, MD;  Location: [facility] ENDOSCOPY;  Service: Gastroenterology;;   TONSILLECTOMY AND ADENOIDECTOMY     TOTAL KNEE ARTHROPLASTY Left     Prior to Admission medications   Medication Sig Start Date End Date Taking? Authorizing Provider  acetaminophen (TYLENOL) 500 MG tablet Take 1 tablet (500 mg total) by mouth every 6 (six) hours as needed for mild pain (pain score 1-3) (or Fever >/= 101). 01/25/23  Yes Sheikh, Omair Latif, DO  allopurinol (ZYLOPRIM) 300 MG tablet Take 150 mg by mouth every Monday, Wednesday, and Friday.   Yes [provider]  ascorbic acid (VITAMIN C) 500 MG tablet Take 500 mg by mouth in the morning.   Yes [provider]  atorvastatin (LIPITOR) 40 MG tablet Take 40 mg by mouth daily.   Yes [provider]  carvedilol (COREG) 12.5 MG tablet Take 6.25 mg by mouth 2 (two) times daily with a meal.   Yes [provider]  cetirizine (ZYRTEC) 10 MG tablet Take 10 mg by mouth daily.  Yes [provider]  Cholecalciferol (VITAMIN D3) 50 MCG (2000 UT) TABS Take 2,000 Units by mouth daily with breakfast.   Yes [provider]  cyanocobalamin 1000 MCG tablet Take 1,000 mcg by mouth daily.   Yes [provider]  furosemide (LASIX) 20 MG tablet Take 4 tablets (80 mg total) by mouth 2 (two) times daily. 10/28/22  Yes Narda Bonds, MD  gabapentin (NEURONTIN) 100 MG capsule Take 200-300 mg by mouth See admin instructions. Take 200 mg by mouth in the morning and 300 mg at bedtime   Yes [provider]  leptospermum manuka honey (MEDIHONEY) PSTE paste Apply 1 Application topically daily. 01/26/23  Yes Sheikh, Omair Latif, DO  levothyroxine (SYNTHROID) 25 MCG tablet Take 25 mcg by mouth daily before breakfast.   Yes [provider]  magnesium oxide (MAG-OX) 400 (240 Mg) MG tablet Take 1,000 mg by mouth at bedtime.   Yes [provider]  Multiple Vitamin (MULTIVITAMIN) capsule Take 1 capsule by mouth daily.   Yes [provider]  pantoprazole (PROTONIX) 40 MG tablet Take 1 tablet (40 mg total) by mouth 2 (two) times daily. 08/28/22 03/07/23 Yes Sheikh, Omair Latif, DO  spironolactone (ALDACTONE) 25 MG tablet Take by mouth. 11/21/22  Yes [provider]  testosterone cypionate (DEPOTESTOSTERONE CYPIONATE) 200 MG/ML injection Inject 0.8-1 mLs into the muscle every 14 (fourteen) days. Inject 160 mg   Yes [provider]  Zinc 50 MG TABS Take 1 tablet (50 mg total) by mouth daily. 03/25/22  Yes [name], MD  ferrous gluconate (FERGON) 324 MG tablet Take 1 tablet (324 mg total) by mouth daily with breakfast. 09/23/22 01/21/23  Dahal, Melina Schools, MD  insulin glargine (LANTUS) 100 UNIT/ML injection Inject 48 Units into the skin in the morning.    [provider]  ondansetron (ZOFRAN) 4 MG tablet Take 1 tablet (4 mg total) by mouth every 6 (six) hours as needed for nausea. 01/25/23   [name], DO  Semaglutide, 1 MG/DOSE, 2 MG/1.5ML SOPN Inject 1 mg into the skin every Sunday.    [provider]  senna-docusate (SENOKOT-S) 8.6-50 MG tablet Take 1 tablet by mouth at bedtime as needed for moderate constipation. 01/25/23   Merlene Laughter, DO    Current Facility-Administered Medications  Medication Dose Route Frequency Provider Last Rate Last Admin   0.9 %  sodium chloride infusion   Intravenous Continuous Doree Albee, PA-C   New Bag at 03/07/23 1035   sodium chloride flush (NS) 0.9 % injection 3-10 mL  3-10 mL Intravenous Q12H Oday Ridings, Carie Caddy, MD       sodium chloride flush (NS) 0.9 % injection 3-10 mL  3-10 mL Intravenous PRN Britnie Colville, Carie Caddy, MD        Allergies as of 11/28/2022 - Review Complete 11/28/2022  Allergen Reaction Noted   Lipitor [atorvastatin  calcium] Other (See Comments) 05/30/2010    Family History  Problem Relation Age of Onset   Coronary artery disease Father    Hypertension Father    Heart disease Father        Heart Disease before age 34   Heart attack Mother    Coronary artery disease Mother    Deep vein thrombosis Mother    Heart disease Mother        Heart Disease before age 35   Hyperlipidemia Mother    Hypertension Mother    Hypertension Sister    Heart disease Sister  Heart Disease before age 14   Hyperlipidemia Sister    Heart disease Brother        Heart Disease before age 103   Colon cancer Brother 19   Anesthesia problems Neg Hx    Diabetes Neg Hx     Social History   Socioeconomic History   Marital status: Married    Spouse name: Not on file   Number of children: Not on file   Years of education: Not on file   Highest education level: Not on file  Occupational History   Occupation: retired  Tobacco Use   Smoking status: Former    Current packs/day: 0.00    Average packs/day: 3.0 packs/day for 32.0 years (96.0 ttl pk-yrs)    Types: Cigarettes    Start date: 01/16/1947    Quit date: 01/16/1979    Years since quitting: 44.1   Smokeless tobacco: Never  Vaping Use   Vaping status: Never Used  Substance and Sexual Activity   Alcohol use: Yes    Alcohol/week: 2.0 standard drinks of alcohol    Types: 2 Glasses of wine per week    Comment: 1 glass of champagne with dinner, occasional beer   Drug use: No   Sexual activity: Not Currently  Other Topics Concern   Not on file  Social History Narrative   Not on file   Social Drivers of Health   Financial Resource Strain: Not on file  Food Insecurity: No Food Insecurity (01/21/2023)   Hunger Vital Sign    Worried About Running Out of Food in the Last Year: Never true    Ran Out of Food in the Last Year: Never true  Transportation Needs: No Transportation Needs (01/21/2023)   PRAPARE - Administrator, Civil Service (Medical): No     Lack of Transportation (Non-Medical): No  Physical Activity: Not on file  Stress: Not on file  Social Connections: Socially Integrated (01/21/2023)   Social Connection and Isolation Panel [NHANES]    Frequency of Communication with Friends and Family: Once a week    Frequency of Social Gatherings with Friends and Family: Twice a week    Attends Religious Services: 1 to 4 times per year    Active Member of [employer] or Organizations: Yes    Attends Banker Meetings: 1 to 4 times per year    Marital Status: Married  Catering manager Violence: Not At Risk (01/21/2023)   Humiliation, Afraid, Rape, and Kick questionnaire    Fear of Current or Ex-Partner: No    Emotionally Abused: No    Physically Abused: No    Sexually Abused: No    Physical Exam: Vital signs in last 24 hours: @BP  (!) 121/51   Pulse 65   Temp (!) 97 F (36.1 C) (Temporal)   Resp 14   Ht 5\' 10"  (1.778 m)   Wt 106.6 kg   SpO2 100%   BMI 33.72 kg/m  GEN: NAD EYE: Sclerae anicteric ENT: MMM CV: Non-tachycardic Pulm: CTA b/l GI: Soft, NT/ND NEURO:  Alert & Oriented x 3   Erick Blinks, MD [facility]  03/07/2023 11:18 AM

## 2023-03-07 NOTE — Transfer of Care (Signed)
Immediate Anesthesia Transfer of Care Note  Patient: Edward [name]  Procedure(s) Performed: ESOPHAGOGASTRODUODENOSCOPY (EGD) WITH PROPOFOL  Patient Location: Endoscopy Unit  Anesthesia Type:MAC  Level of Consciousness: drowsy and patient cooperative  Airway & Oxygen Therapy: Patient Spontanous Breathing and Patient connected to face mask oxygen  Post-op Assessment: Report given to RN and Post -op Vital signs reviewed and stable  Post vital signs: Reviewed and stable  Last Vitals:  Vitals Value Taken Time  BP 104/64 03/07/23 1107  Temp    Pulse 63 03/07/23 1107  Resp 10 03/07/23 1107  SpO2 100 % 03/07/23 1107  Vitals shown include unfiled device data.  Last Pain:  Vitals:   03/07/23 0942  TempSrc: Temporal  PainSc: 0-No pain         Complications: No notable events documented.

## 2023-03-07 NOTE — Discharge Instructions (Signed)

## 2023-03-08 ENCOUNTER — Encounter (HOSPITAL_COMMUNITY): Payer: Self-pay | Admitting: Internal Medicine

## 2023-03-11 ENCOUNTER — Other Ambulatory Visit: Payer: No Typology Code available for payment source

## 2023-03-18 ENCOUNTER — Other Ambulatory Visit (HOSPITAL_COMMUNITY): Payer: Self-pay | Admitting: Physician Assistant

## 2023-03-18 DIAGNOSIS — K7469 Other cirrhosis of liver: Secondary | ICD-10-CM

## 2023-03-21 ENCOUNTER — Ambulatory Visit (HOSPITAL_COMMUNITY)
Admission: RE | Admit: 2023-03-21 | Discharge: 2023-03-21 | Disposition: A | Discharge status: Home / Self Care | Source: Ambulatory Visit | Attending: Physician Assistant | Admitting: Physician Assistant

## 2023-03-21 DIAGNOSIS — K7469 Other cirrhosis of liver: Secondary | ICD-10-CM | POA: Insufficient documentation

## 2023-03-21 DIAGNOSIS — R188 Other ascites: Secondary | ICD-10-CM | POA: Diagnosis not present

## 2023-03-21 HISTORY — PX: IR PARACENTESIS: IMG2679

## 2023-03-21 MED ORDER — LIDOCAINE HCL 1 % IJ SOLN
INTRAMUSCULAR | Status: AC
  Filled 2023-03-21: qty 20

## 2023-03-21 NOTE — Procedures (Signed)
 PROCEDURE SUMMARY:  Successful ultrasound guided paracentesis from the right lower quadrant.  Yielded 5.8 L of clear yellow fluid.  No immediate complications.  The patient tolerated the procedure well.   Specimen not sent for labs.  EBL < 2 mL  The patient has required >/=2 paracenteses in a 30 day period and a screening evaluation by the [facility] has been arranged.  Alwyn Ren, AGACNP-BC 03/21/2023, 2:29 PM

## 2023-04-16 DEATH — deceased

## 2023-04-22 ENCOUNTER — Ambulatory Visit: Payer: No Typology Code available for payment source | Admitting: Nurse Practitioner

## 2023-05-23 ENCOUNTER — Encounter (HOSPITAL_COMMUNITY): Payer: No Typology Code available for payment source

## 2023-05-23 ENCOUNTER — Ambulatory Visit (HOSPITAL_COMMUNITY): Payer: No Typology Code available for payment source

## 2023-05-23 ENCOUNTER — Ambulatory Visit: Payer: No Typology Code available for payment source

## 2023-05-23 ENCOUNTER — Other Ambulatory Visit (HOSPITAL_COMMUNITY): Payer: No Typology Code available for payment source
